# Patient Record
Sex: Male | Born: 1940 | Race: White | Hispanic: No | Marital: Married | Smoking: Former smoker
Health system: Southern US, Community
[De-identification: ages and names within clinical notes are randomized; demographics above are authoritative.]

## PROBLEM LIST (undated history)

## (undated) DIAGNOSIS — M1990 Unspecified osteoarthritis, unspecified site: Secondary | ICD-10-CM

## (undated) DIAGNOSIS — R079 Chest pain, unspecified: Secondary | ICD-10-CM

## (undated) DIAGNOSIS — I5022 Chronic systolic (congestive) heart failure: Secondary | ICD-10-CM

## (undated) DIAGNOSIS — D500 Iron deficiency anemia secondary to blood loss (chronic): Secondary | ICD-10-CM

## (undated) DIAGNOSIS — K5790 Diverticulosis of intestine, part unspecified, without perforation or abscess without bleeding: Secondary | ICD-10-CM

## (undated) DIAGNOSIS — K2100 Gastro-esophageal reflux disease with esophagitis, without bleeding: Secondary | ICD-10-CM

## (undated) DIAGNOSIS — I219 Acute myocardial infarction, unspecified: Secondary | ICD-10-CM

## (undated) DIAGNOSIS — I739 Peripheral vascular disease, unspecified: Secondary | ICD-10-CM

## (undated) DIAGNOSIS — I255 Ischemic cardiomyopathy: Secondary | ICD-10-CM

## (undated) DIAGNOSIS — R51 Headache: Secondary | ICD-10-CM

## (undated) DIAGNOSIS — I10 Essential (primary) hypertension: Secondary | ICD-10-CM

## (undated) DIAGNOSIS — R519 Headache, unspecified: Secondary | ICD-10-CM

## (undated) DIAGNOSIS — R55 Syncope and collapse: Secondary | ICD-10-CM

## (undated) DIAGNOSIS — I2699 Other pulmonary embolism without acute cor pulmonale: Secondary | ICD-10-CM

## (undated) DIAGNOSIS — I779 Disorder of arteries and arterioles, unspecified: Secondary | ICD-10-CM

## (undated) DIAGNOSIS — I2510 Atherosclerotic heart disease of native coronary artery without angina pectoris: Secondary | ICD-10-CM

## (undated) DIAGNOSIS — R0789 Other chest pain: Secondary | ICD-10-CM

## (undated) DIAGNOSIS — R42 Dizziness and giddiness: Secondary | ICD-10-CM

## (undated) DIAGNOSIS — Z9289 Personal history of other medical treatment: Secondary | ICD-10-CM

## (undated) DIAGNOSIS — R05 Cough: Secondary | ICD-10-CM

## (undated) DIAGNOSIS — D126 Benign neoplasm of colon, unspecified: Secondary | ICD-10-CM

## (undated) DIAGNOSIS — R059 Cough, unspecified: Secondary | ICD-10-CM

## (undated) DIAGNOSIS — R0600 Dyspnea, unspecified: Secondary | ICD-10-CM

## (undated) DIAGNOSIS — E785 Hyperlipidemia, unspecified: Secondary | ICD-10-CM

## (undated) HISTORY — PX: CARPAL TUNNEL RELEASE: SHX101

## (undated) HISTORY — DX: Peripheral vascular disease, unspecified: I73.9

## (undated) HISTORY — PX: CATARACT EXTRACTION: SUR2

## (undated) HISTORY — PX: TONSILLECTOMY: SUR1361

## (undated) HISTORY — DX: Chronic systolic (congestive) heart failure: I50.22

## (undated) HISTORY — DX: Other chest pain: R07.89

## (undated) HISTORY — PX: OTHER SURGICAL HISTORY: SHX169

## (undated) HISTORY — DX: Atherosclerotic heart disease of native coronary artery without angina pectoris: I25.10

## (undated) HISTORY — DX: Chest pain, unspecified: R07.9

## (undated) HISTORY — DX: Iron deficiency anemia secondary to blood loss (chronic): D50.0

## (undated) HISTORY — DX: Ischemic cardiomyopathy: I25.5

## (undated) HISTORY — DX: Disorder of arteries and arterioles, unspecified: I77.9

## (undated) HISTORY — PX: COLONOSCOPY: SHX174

## (undated) HISTORY — DX: Hyperlipidemia, unspecified: E78.5

---
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
## 2004-05-02 ENCOUNTER — Ambulatory Visit: Payer: Self-pay | Admitting: Gastroenterology

## 2005-02-02 ENCOUNTER — Ambulatory Visit: Payer: Self-pay | Admitting: Gastroenterology

## 2005-02-21 ENCOUNTER — Ambulatory Visit: Payer: Self-pay

## 2005-03-14 ENCOUNTER — Ambulatory Visit: Payer: Self-pay | Admitting: Orthopaedic Surgery

## 2005-09-07 ENCOUNTER — Emergency Department: Payer: Self-pay | Admitting: Emergency Medicine

## 2007-06-25 ENCOUNTER — Ambulatory Visit: Payer: Self-pay | Admitting: Unknown Physician Specialty

## 2007-07-08 ENCOUNTER — Emergency Department: Payer: Self-pay | Admitting: Emergency Medicine

## 2007-07-19 ENCOUNTER — Emergency Department: Payer: Self-pay

## 2009-01-29 ENCOUNTER — Ambulatory Visit: Payer: Self-pay

## 2009-12-08 ENCOUNTER — Encounter: Admission: RE | Admit: 2009-12-08 | Discharge: 2009-12-08 | Payer: Self-pay | Admitting: Orthopedic Surgery

## 2010-02-03 ENCOUNTER — Encounter
Admission: RE | Admit: 2010-02-03 | Discharge: 2010-02-03 | Payer: Self-pay | Source: Home / Self Care | Attending: Orthopedic Surgery | Admitting: Orthopedic Surgery

## 2010-08-24 ENCOUNTER — Ambulatory Visit: Payer: Self-pay | Admitting: Gastroenterology

## 2010-08-30 ENCOUNTER — Ambulatory Visit: Payer: Self-pay | Admitting: Gastroenterology

## 2011-08-11 ENCOUNTER — Ambulatory Visit: Payer: Self-pay | Admitting: General Practice

## 2012-02-14 ENCOUNTER — Ambulatory Visit: Payer: Self-pay | Admitting: Ophthalmology

## 2012-05-01 ENCOUNTER — Ambulatory Visit: Payer: Self-pay | Admitting: Cardiology

## 2012-05-01 HISTORY — PX: CARDIAC CATHETERIZATION: SHX172

## 2012-07-29 ENCOUNTER — Ambulatory Visit: Payer: Self-pay | Admitting: Cardiology

## 2012-08-22 ENCOUNTER — Ambulatory Visit: Payer: Self-pay | Admitting: Orthopedic Surgery

## 2012-08-22 HISTORY — PX: DE QUERVAIN'S RELEASE: SHX1439

## 2013-04-23 HISTORY — PX: CARDIAC CATHETERIZATION: SHX172

## 2013-05-05 ENCOUNTER — Ambulatory Visit (INDEPENDENT_AMBULATORY_CARE_PROVIDER_SITE_OTHER): Payer: Medicare Other | Admitting: Cardiovascular Disease

## 2013-05-05 ENCOUNTER — Encounter (INDEPENDENT_AMBULATORY_CARE_PROVIDER_SITE_OTHER): Payer: Self-pay

## 2013-05-05 ENCOUNTER — Encounter: Payer: Self-pay | Admitting: Cardiovascular Disease

## 2013-05-05 VITALS — BP 120/62 | HR 68 | Ht 70.0 in | Wt 184.5 lb

## 2013-05-05 DIAGNOSIS — I209 Angina pectoris, unspecified: Secondary | ICD-10-CM | POA: Insufficient documentation

## 2013-05-05 DIAGNOSIS — E785 Hyperlipidemia, unspecified: Secondary | ICD-10-CM

## 2013-05-05 DIAGNOSIS — I2510 Atherosclerotic heart disease of native coronary artery without angina pectoris: Secondary | ICD-10-CM

## 2013-05-05 DIAGNOSIS — R079 Chest pain, unspecified: Secondary | ICD-10-CM | POA: Insufficient documentation

## 2013-05-05 DIAGNOSIS — Z87891 Personal history of nicotine dependence: Secondary | ICD-10-CM

## 2013-05-05 DIAGNOSIS — R0602 Shortness of breath: Secondary | ICD-10-CM | POA: Insufficient documentation

## 2013-05-05 DIAGNOSIS — I25709 Atherosclerosis of coronary artery bypass graft(s), unspecified, with unspecified angina pectoris: Secondary | ICD-10-CM | POA: Insufficient documentation

## 2013-05-05 NOTE — Patient Instructions (Signed)
You are doing well. No medication changes were made.  We will discuss your cardiac cath films and try to call you back tomorrow with a plan If you do not hear from the office by Wednesday, please call the office  Please call us if you have new issues that need to be addressed before your next appt.

## 2013-05-05 NOTE — Assessment & Plan Note (Signed)
Subtotally occluded left circumflex, severe disease of the distal RCA, moderate disease of the LAD. Now with new symptoms of worsening shortness of breath and chest pain. Will review films. Will likely need a catheterization

## 2013-05-05 NOTE — Assessment & Plan Note (Signed)
40 years of smoking. Likely has some component of COPD that might be contributing to his shortness of breath

## 2013-05-05 NOTE — Assessment & Plan Note (Addendum)
Etiology of his shortness of breath is unclear. Concerning for worsening ischemia. Unable to exclude COPD, pulmonary hypertension. He has known severe left circumflex disease, RCA disease and LAD disease By catheterization in 2014. I suggested that we review his old films . These were reviewed by myself in clinic today. Uncertain if there has been progression of his disease. If there has been no progression, intervention to the RCA and possibly even the left circumflex might be possible . We'll discuss the images with Dr.  Fletcher Anon. No medication changes made at this time .

## 2013-05-05 NOTE — Assessment & Plan Note (Signed)
Exertional related shortness of breath and chest pain concerning for angina. We will review his prior cardiac catheterization films. Suspect he will need repeat catheterization given prior severe disease

## 2013-05-05 NOTE — Assessment & Plan Note (Signed)
Cholesterol is at goal on the current lipid regimen. No changes to the medications were made.  

## 2013-05-05 NOTE — Progress Notes (Signed)
Patient ID: Scott [name], male    DOB: [birth year], 73 y.o.   MRN: [id number]  HPI Comments: Scott [name] is a pleasant 73 year old gentleman with history of CAD, cardiac catheterization April 2014 showing severe distal RCA disease, with left to right collaterals, subtotally occluded proximal left circumflex, 50% proximal LAD, 40% mid LAD disease, ejection fraction at that time 35%, echocardiogram showing ejection fraction 40-45% presenting for new patient evaluation for shortness of breath.  Reports that in March of 2014, Scott [name] developed shortness of breath. Scott [name] had catheterization as detailed above. Scott [name] is active but has noticed increasing shortness of breath and chest pain over the past several months. Scott [name] continues to do moderate exercise but is more limited. Chest pain does not last, typically will resolve with resting. Scott [name] did report having some chest pain in January of 2015 his outside cardiologist. No intervention planned, medical management recommended.  Echocardiogram 04/22/2012 showing ejection fraction 40%, no mention of wall motion abnormality, mild MR, TR Stress test 04/25/2012 with no significant ischemia. Perfusion defect consistent with infarct in the inferolateral wall, ejection fraction 33% rest mother skin July 2014 showing ejection fraction 40%, no wall motion abnormality  Scott [name] reports Scott [name] is active over the winter, does woodworking but no aerobic. 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  Scott [name] did smoke from age [age], total of 40 years. Stopped smoking many years ago. In 2011 Denies having any diabetes. Has been compliant with his cholesterol medication  EKG shows normal sinus rhythm with rate 68 beats per minute, nonspecific ST and T wave abnormality in the anterolateral and inferior leads   Outpatient Encounter Prescriptions as of 05/05/2013  Medication Sig  . aspirin EC 81 MG tablet Take 162 mg by mouth daily.   Marland Kitchen losartan (COZAAR) 50 MG tablet  Take 50 mg by mouth daily.  . metoprolol succinate (TOPROL-XL) 50 MG 24 hr tablet Take 50 mg by mouth daily. Take with or immediately following a meal.  . simvastatin (ZOCOR) 40 MG tablet Take 40 mg by mouth daily.     Review of Systems  Constitutional: Negative.   HENT: Negative.   Eyes: Negative.   Respiratory: Negative.   Cardiovascular: Negative.   Gastrointestinal: Negative.   Endocrine: Negative.   Musculoskeletal: Negative.   Skin: Negative.   Allergic/Immunologic: Negative.   Neurological: Negative.   Hematological: Negative.   Psychiatric/Behavioral: Negative.   All other systems reviewed and are negative.   BP 120/62  Pulse 68  Ht 5\' 10"  (1.778 m)  Wt 184 lb 8 oz (83.689 kg)  BMI 26.47 kg/m2  Physical Exam  Nursing note and vitals reviewed. Constitutional: Scott [name] is oriented to person, place, and time. Scott [name] appears well-developed and well-nourished.  HENT:  Head: Normocephalic.  Nose: Nose normal.  Mouth/Throat: Oropharynx is clear and moist.  Eyes: Conjunctivae are normal. Pupils are equal, round, and reactive to light.  Neck: Normal range of motion. Neck supple. No JVD present.  Cardiovascular: Normal rate, regular rhythm, S1 normal, S2 normal, normal heart sounds and intact distal pulses.  Exam reveals no gallop and no friction rub.   No murmur heard. Pulmonary/Chest: Effort normal and breath sounds normal. No respiratory distress. Scott [name] has no wheezes. Scott [name] has no rales. Scott [name] exhibits no tenderness.  Abdominal: Soft. Bowel sounds are normal. Scott [name] exhibits no distension. There is no tenderness.  Musculoskeletal: Normal range of motion. Scott [name] exhibits no edema and no tenderness.  Lymphadenopathy:  Scott [name] has no cervical adenopathy.  Neurological: Scott [name] is alert and oriented to person, place, and time. Coordination normal.  Skin: Skin is warm and dry. No rash noted. No erythema.  Psychiatric: Scott [name] has a normal mood and affect. His behavior is normal. Judgment and thought content  normal.      Assessment and Plan

## 2013-05-06 ENCOUNTER — Telehealth: Payer: Self-pay

## 2013-05-06 DIAGNOSIS — Z01810 Encounter for preprocedural cardiovascular examination: Secondary | ICD-10-CM

## 2013-05-06 DIAGNOSIS — Z01812 Encounter for preprocedural laboratory examination: Secondary | ICD-10-CM

## 2013-05-06 DIAGNOSIS — R079 Chest pain, unspecified: Secondary | ICD-10-CM

## 2013-05-06 NOTE — Telephone Encounter (Signed)
Spoke w/ pt.  Advised him that Dr. Rockey Situ had spoke w/ Dr. Fletcher Anon and they agree that pt should proceed w/ cath.  Advised him that I will call him back when they are able to coordinate their schedules and set this up. Pt is agreeable to this and will await further instructions.

## 2013-05-07 NOTE — Telephone Encounter (Signed)
Spoke w/ pt.  He is agreeable to proceeding w/ cardiac cath on 05/16/13. 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 Advised him that I will mail him info on instructions for cath.

## 2013-05-09 ENCOUNTER — Other Ambulatory Visit: Payer: Self-pay

## 2013-05-09 ENCOUNTER — Ambulatory Visit: Payer: Self-pay | Admitting: Cardiovascular Disease

## 2013-05-09 DIAGNOSIS — R079 Chest pain, unspecified: Secondary | ICD-10-CM

## 2013-05-09 DIAGNOSIS — Z01810 Encounter for preprocedural cardiovascular examination: Secondary | ICD-10-CM

## 2013-05-09 DIAGNOSIS — Z01812 Encounter for preprocedural laboratory examination: Secondary | ICD-10-CM

## 2013-05-09 LAB — BASIC METABOLIC PANEL
ANION GAP: 4 — AB (ref 7–16)
BUN: 23 mg/dL — AB (ref 7–18)
CALCIUM: 8.7 mg/dL (ref 8.5–10.1)
Chloride: 106 mmol/L (ref 98–107)
Co2: 28 mmol/L (ref 21–32)
Creatinine: 1.18 mg/dL (ref 0.60–1.30)
EGFR (African American): >60
Glucose: 87 mg/dL (ref 65–99)
Osmolality: 279 (ref 275–301)
Potassium: 4 mmol/L (ref 3.5–5.1)
Sodium: 138 mmol/L (ref 136–145)

## 2013-05-09 LAB — CBC WITH DIFFERENTIAL/PLATELET
BASOS ABS: 0.1 10*3/uL (ref 0.0–0.1)
BASOS PCT: 1.1 %
Eosinophil #: 0 10*3/uL (ref 0.0–0.7)
Eosinophil %: 0.9 %
HCT: 35.7 % — ABNORMAL LOW (ref 40.0–52.0)
HGB: 10.9 g/dL — ABNORMAL LOW (ref 13.0–18.0)
Lymphocyte #: 1.6 10*3/uL (ref 1.0–3.6)
Lymphocyte %: 33.5 %
MCH: 22.2 pg — ABNORMAL LOW (ref 26.0–34.0)
MCHC: 30.6 g/dL — ABNORMAL LOW (ref 32.0–36.0)
MCV: 73 fL — ABNORMAL LOW (ref 80–100)
MONOS PCT: 7.7 %
Monocyte #: 0.4 x10 3/mm (ref 0.2–1.0)
Neutrophil #: 2.7 10*3/uL (ref 1.4–6.5)
Neutrophil %: 56.8 %
PLATELETS: 302 10*3/uL (ref 150–440)
RBC: 4.92 10*6/uL (ref 4.40–5.90)
RDW: 18.4 % — ABNORMAL HIGH (ref 11.5–14.5)
WBC: 4.7 10*3/uL (ref 3.8–10.6)

## 2013-05-09 LAB — PROTIME-INR
INR: 1
PROTHROMBIN TIME: 13.1 s (ref 11.5–14.7)

## 2013-05-16 ENCOUNTER — Encounter: Payer: Self-pay | Admitting: Cardiovascular Disease

## 2013-05-16 ENCOUNTER — Ambulatory Visit: Payer: Self-pay | Admitting: Cardiovascular Disease

## 2013-05-16 DIAGNOSIS — I2510 Atherosclerotic heart disease of native coronary artery without angina pectoris: Secondary | ICD-10-CM

## 2013-05-16 LAB — CK TOTAL AND CKMB (NOT AT ARMC)
CK, TOTAL: 132 U/L
CK-MB: 1.4 ng/mL (ref 0.5–3.6)

## 2013-05-17 ENCOUNTER — Other Ambulatory Visit: Payer: Self-pay | Admitting: Cardiovascular Disease

## 2013-05-17 DIAGNOSIS — I209 Angina pectoris, unspecified: Secondary | ICD-10-CM

## 2013-05-17 DIAGNOSIS — I2510 Atherosclerotic heart disease of native coronary artery without angina pectoris: Secondary | ICD-10-CM

## 2013-05-17 LAB — BASIC METABOLIC PANEL
ANION GAP: 5 — AB (ref 7–16)
BUN: 16 mg/dL (ref 7–18)
CALCIUM: 8.6 mg/dL (ref 8.5–10.1)
CO2: 27 mmol/L (ref 21–32)
CREATININE: 1.15 mg/dL (ref 0.60–1.30)
Chloride: 104 mmol/L (ref 98–107)
EGFR (African American): >60
EGFR (Non-African Amer.): >60
GLUCOSE: 101 mg/dL — AB (ref 65–99)
Osmolality: 273 (ref 275–301)
Potassium: 4.1 mmol/L (ref 3.5–5.1)
Sodium: 136 mmol/L (ref 136–145)

## 2013-05-17 MED ORDER — PRASUGREL HCL 10 MG PO TABS: 10.0000 mg | ORAL_TABLET | Freq: Every day | ORAL | Status: DC

## 2013-05-17 MED ORDER — ASPIRIN EC 81 MG PO TBEC: 81.0000 mg | DELAYED_RELEASE_TABLET | Freq: Every day | ORAL | Status: DC

## 2013-05-19 ENCOUNTER — Encounter: Payer: Self-pay | Admitting: Cardiovascular Disease

## 2013-05-26 ENCOUNTER — Encounter: Payer: Self-pay | Admitting: *Deleted

## 2013-05-30 ENCOUNTER — Encounter: Payer: Self-pay | Admitting: Cardiovascular Disease

## 2013-05-30 ENCOUNTER — Ambulatory Visit (INDEPENDENT_AMBULATORY_CARE_PROVIDER_SITE_OTHER): Payer: Medicare Other | Admitting: Cardiovascular Disease

## 2013-05-30 VITALS — BP 118/66 | HR 63 | Ht 70.0 in | Wt 184.0 lb

## 2013-05-30 DIAGNOSIS — R0602 Shortness of breath: Secondary | ICD-10-CM

## 2013-05-30 DIAGNOSIS — Z9861 Coronary angioplasty status: Secondary | ICD-10-CM

## 2013-05-30 DIAGNOSIS — I2510 Atherosclerotic heart disease of native coronary artery without angina pectoris: Secondary | ICD-10-CM

## 2013-05-30 DIAGNOSIS — Z955 Presence of coronary angioplasty implant and graft: Secondary | ICD-10-CM | POA: Insufficient documentation

## 2013-05-30 DIAGNOSIS — E785 Hyperlipidemia, unspecified: Secondary | ICD-10-CM

## 2013-05-30 DIAGNOSIS — I209 Angina pectoris, unspecified: Secondary | ICD-10-CM

## 2013-05-30 NOTE — Assessment & Plan Note (Signed)
Encouraged him to stay on his simvastatin. Goal LDL less than 70

## 2013-05-30 NOTE — Assessment & Plan Note (Signed)
He denies any further chest pain or symptoms concerning for angina since the stent placement. No further medication changes at this time. If he has recurrent symptoms, may need to discuss high risk intervention of his left circumflex

## 2013-05-30 NOTE — Progress Notes (Signed)
Patient ID: Scott [name], male    DOB: [birth year], 73 y.o.   MRN: [id number]  HPI Comments: Scott [name] is a pleasant 73 year old gentleman with history of CAD, cardiac catheterization April 2014 showing severe distal RCA disease, with left to right collaterals, subtotally occluded proximal left circumflex, 50% proximal LAD, 40% mid LAD disease, ejection fraction at that time 35%, echocardiogram showing ejection fraction 40-45% , shortness of breath on his last clinic visit, now presenting after recent stenting to his RCA.  March of 2014, he developed shortness of breath. He had catheterization as detailed above.  increasing shortness of breath and chest pain over the past several months. He did report having some chest pain in January of 2015 his outside cardiologist. No intervention planned, medical management recommended.  We scheduled a repeat cardiac catheterization 05/16/2013. His showed severe mid to distal RCA disease, severe subtotally occluded proximal left circumflex disease, left to right collaterals from the mid to distal left circumflex, mild to moderate proximal LAD disease, ejection fraction 50%. After long discussion with Dr. Cecelia Byars it was felt that he would be best served by intervention on his RCA rather than the left circumflex which was a smaller vessel. There was no significant targets for CABG and LAD grafting was not needed.   In followup today, he reports that he feels better, still with some fatigue. He is active around the house, no regular aerobic program. He is scheduled to see cardiac rehabilitation in the next several days  Echocardiogram 04/22/2012 showing ejection fraction 40%, no mention of wall motion abnormality, mild MR, TR Stress test 04/25/2012 with no significant ischemia. Perfusion defect consistent with infarct in the inferolateral wall, ejection fraction 33% rest mother skin July 2014 showing ejection fraction 40%, no wall motion abnormality  He reports he is  active over the winter, does woodworking but no aerobic. 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  He did smoke from age [age], total of 40 years. Stopped smoking many years ago. In 2011 Denies having any diabetes. Has been compliant with his cholesterol medication  EKG shows normal sinus rhythm with rate 63 beats per minute, QRS 128 ms, nonspecific intraventricular block   Outpatient Encounter Prescriptions as of 05/30/2013  Medication Sig  . aspirin EC 81 MG tablet Take 1 tablet (81 mg total) by mouth daily.  Marland Kitchen losartan (COZAAR) 50 MG tablet Take 50 mg by mouth daily.  . metoprolol succinate (TOPROL-XL) 50 MG 24 hr tablet Take 50 mg by mouth daily. Take with or immediately following a meal.  . prasugrel (EFFIENT) 10 MG TABS tablet Take 1 tablet (10 mg total) by mouth daily.  . simvastatin (ZOCOR) 40 MG tablet Take 40 mg by mouth daily.    Review of Systems  Constitutional: Positive for fatigue.  HENT: Negative.   Eyes: Negative.   Respiratory: Negative.   Cardiovascular: Negative.   Gastrointestinal: Negative.   Endocrine: Negative.   Musculoskeletal: Negative.   Skin: Negative.   Allergic/Immunologic: Negative.   Neurological: Negative.   Hematological: Negative.   Psychiatric/Behavioral: Negative.   All other systems reviewed and are negative.   BP 118/66  Pulse 63  Ht 5\' 10"  (1.778 m)  Wt 184 lb (83.462 kg)  BMI 26.40 kg/m2  Physical Exam  Nursing note and vitals reviewed. Constitutional: He is oriented to person, place, and time. He appears well-developed and well-nourished.  HENT:  Head: Normocephalic.  Nose: Nose normal.  Mouth/Throat: Oropharynx  is clear and moist.  Eyes: Conjunctivae are normal. Pupils are equal, round, and reactive to light.  Neck: Normal range of motion. Neck supple. No JVD present.  Cardiovascular: Normal rate, regular rhythm, S1 normal, S2 normal, normal heart sounds and intact distal pulses.  Exam  reveals no gallop and no friction rub.   No murmur heard. Pulmonary/Chest: Effort normal and breath sounds normal. No respiratory distress. He has no wheezes. He has no rales. He exhibits no tenderness.  Abdominal: Soft. Bowel sounds are normal. He exhibits no distension. There is no tenderness.  Musculoskeletal: Normal range of motion. He exhibits no edema and no tenderness.  Lymphadenopathy:    He has no cervical adenopathy.  Neurological: He is alert and oriented to person, place, and time. Coordination normal.  Skin: Skin is warm and dry. No rash noted. No erythema.  Psychiatric: He has a normal mood and affect. His behavior is normal. Judgment and thought content normal.      Assessment and Plan

## 2013-05-30 NOTE — Assessment & Plan Note (Signed)
Recent intervention with stenting to his mid to distal RCA. Encouraged him to stay on his aspirin and effient

## 2013-05-30 NOTE — Patient Instructions (Signed)
You are doing well. No medication changes were made.  Please monitor your blood pressure at home CAll the office if it runs low (less than 110 on the top number)  Please call us if you have new issues that need to be addressed before your next appt.  Your physician wants you to follow-up in: 6 months.  You will receive a reminder letter in the mail two months in advance. If you don't receive a letter, please call our office to schedule the follow-up appointment.

## 2013-05-30 NOTE — Assessment & Plan Note (Signed)
Scott [name] is patent, now with stenting to his RCA. Still with residual severe left circumflex disease. This is a small vessel, not easily amenable to intervention.

## 2013-06-02 ENCOUNTER — Telehealth: Payer: Self-pay | Admitting: *Deleted

## 2013-06-02 NOTE — Telephone Encounter (Signed)
Spoke w/ pt.  He reports that he was working out in his yard, felt "swimmy-headed, like when you've had a couple of shots". Advised pt to try OTC meclizine 25mg  and see if this helped w/ symptoms. Offered to sched carotid u/s.  He would like to proceed w/ this as soon as we have an available time.  Pt sched to carotid tomorrow at 3:00.

## 2013-06-02 NOTE — Telephone Encounter (Signed)
Patient called and patient gets very dizzy everytime he looks up.

## 2013-06-03 ENCOUNTER — Encounter (INDEPENDENT_AMBULATORY_CARE_PROVIDER_SITE_OTHER): Payer: Medicare Other

## 2013-06-03 ENCOUNTER — Other Ambulatory Visit: Payer: Self-pay

## 2013-06-03 DIAGNOSIS — I209 Angina pectoris, unspecified: Secondary | ICD-10-CM

## 2013-06-03 DIAGNOSIS — I2510 Atherosclerotic heart disease of native coronary artery without angina pectoris: Secondary | ICD-10-CM

## 2013-06-03 DIAGNOSIS — R42 Dizziness and giddiness: Secondary | ICD-10-CM

## 2013-06-03 DIAGNOSIS — I6529 Occlusion and stenosis of unspecified carotid artery: Secondary | ICD-10-CM

## 2013-06-05 ENCOUNTER — Encounter: Payer: Self-pay | Admitting: Cardiovascular Disease

## 2013-06-23 ENCOUNTER — Encounter: Payer: Self-pay | Admitting: Cardiovascular Disease

## 2013-07-23 ENCOUNTER — Encounter: Payer: Self-pay | Admitting: Cardiovascular Disease

## 2013-08-23 ENCOUNTER — Encounter: Payer: Self-pay | Admitting: Cardiovascular Disease

## 2013-09-03 ENCOUNTER — Inpatient Hospital Stay: Payer: Self-pay | Admitting: Cardiovascular Disease

## 2013-09-03 DIAGNOSIS — I10 Essential (primary) hypertension: Secondary | ICD-10-CM

## 2013-09-03 DIAGNOSIS — I200 Unstable angina: Secondary | ICD-10-CM

## 2013-09-03 DIAGNOSIS — E785 Hyperlipidemia, unspecified: Secondary | ICD-10-CM

## 2013-09-03 LAB — APTT: ACTIVATED PTT: 30 s (ref 23.6–35.9)

## 2013-09-03 LAB — CBC
HCT: 34 % — ABNORMAL LOW (ref 40.0–52.0)
HGB: 10.4 g/dL — ABNORMAL LOW (ref 13.0–18.0)
MCH: 21.5 pg — ABNORMAL LOW (ref 26.0–34.0)
MCHC: 30.5 g/dL — ABNORMAL LOW (ref 32.0–36.0)
MCV: 71 fL — ABNORMAL LOW (ref 80–100)
Platelet: 273 10*3/uL (ref 150–440)
RBC: 4.82 10*6/uL (ref 4.40–5.90)
RDW: 20.1 % — ABNORMAL HIGH (ref 11.5–14.5)
WBC: 4.1 10*3/uL (ref 3.8–10.6)

## 2013-09-03 LAB — CK TOTAL AND CKMB (NOT AT ARMC)
CK, TOTAL: 144 U/L
CK, Total: 116 U/L
CK-MB: 0.8 ng/mL (ref 0.5–3.6)
CK-MB: 0.9 ng/mL (ref 0.5–3.6)

## 2013-09-03 LAB — TROPONIN I: Troponin-I: <0.02 ng/mL

## 2013-09-03 LAB — HEPARIN LEVEL (UNFRACTIONATED): Anti-Xa(Unfractionated): 0.47 IU/mL (ref 0.30–0.70)

## 2013-09-03 LAB — PROTIME-INR
INR: 1.1
Prothrombin Time: 13.7 secs (ref 11.5–14.7)

## 2013-09-04 DIAGNOSIS — I059 Rheumatic mitral valve disease, unspecified: Secondary | ICD-10-CM

## 2013-09-04 LAB — LIPID PANEL
Cholesterol: 117 mg/dL (ref 0–200)
HDL Cholesterol: 44 mg/dL (ref 40–60)
Ldl Cholesterol, Calc: 57 mg/dL (ref 0–100)
Triglycerides: 78 mg/dL (ref 0–200)
VLDL CHOLESTEROL, CALC: 16 mg/dL (ref 5–40)

## 2013-09-04 LAB — CBC WITH DIFFERENTIAL/PLATELET
BASOS PCT: 0.7 %
Basophil #: 0 10*3/uL (ref 0.0–0.1)
Eosinophil #: 0.1 10*3/uL (ref 0.0–0.7)
Eosinophil %: 2.2 %
HCT: 34.1 % — ABNORMAL LOW (ref 40.0–52.0)
HGB: 10.5 g/dL — ABNORMAL LOW (ref 13.0–18.0)
Lymphocyte #: 2.3 10*3/uL (ref 1.0–3.6)
Lymphocyte %: 36.1 %
MCH: 21.7 pg — ABNORMAL LOW (ref 26.0–34.0)
MCHC: 30.7 g/dL — ABNORMAL LOW (ref 32.0–36.0)
MCV: 71 fL — ABNORMAL LOW (ref 80–100)
MONO ABS: 0.6 x10 3/mm (ref 0.2–1.0)
Monocyte %: 9.4 %
Neutrophil #: 3.3 10*3/uL (ref 1.4–6.5)
Neutrophil %: 51.6 %
PLATELETS: 259 10*3/uL (ref 150–440)
RBC: 4.82 10*6/uL (ref 4.40–5.90)
RDW: 20.4 % — ABNORMAL HIGH (ref 11.5–14.5)
WBC: 6.4 10*3/uL (ref 3.8–10.6)

## 2013-09-04 LAB — BASIC METABOLIC PANEL
Anion Gap: 8 (ref 7–16)
BUN: 17 mg/dL (ref 7–18)
CO2: 28 mmol/L (ref 21–32)
CREATININE: 1.36 mg/dL — AB (ref 0.60–1.30)
Calcium, Total: 8.8 mg/dL (ref 8.5–10.1)
Chloride: 107 mmol/L (ref 98–107)
EGFR (African American): >60
GFR CALC NON AF AMER: 52 — AB
Glucose: 96 mg/dL (ref 65–99)
OSMOLALITY: 286 (ref 275–301)
Potassium: 4.1 mmol/L (ref 3.5–5.1)
SODIUM: 143 mmol/L (ref 136–145)

## 2013-09-04 LAB — HEPARIN LEVEL (UNFRACTIONATED): Anti-Xa(Unfractionated): 0.54 IU/mL (ref 0.30–0.70)

## 2013-09-04 LAB — TROPONIN I

## 2013-09-04 LAB — TSH: Thyroid Stimulating Horm: 3.29 u[IU]/mL

## 2013-09-05 ENCOUNTER — Encounter: Payer: Self-pay | Admitting: Cardiovascular Disease

## 2013-09-05 ENCOUNTER — Telehealth: Payer: Self-pay

## 2013-09-05 MED ORDER — NITROGLYCERIN 0.4 MG SL SUBL: 0.4000 mg | SUBLINGUAL_TABLET | SUBLINGUAL | Status: DC | PRN

## 2013-09-05 NOTE — Telephone Encounter (Signed)
Patient contacted regarding discharge from [facility] on 09/04/13.  Patient understands to follow up with Ignacia Bayley, NP on 09/11/13 at 2:45 at [facility]. Patient understands discharge instructions? yes Patient understands medications and regiment? yes Patient understands to bring all medications to this visit? yes

## 2013-09-11 ENCOUNTER — Encounter: Payer: Self-pay | Admitting: Nurse Practitioner

## 2013-09-11 ENCOUNTER — Ambulatory Visit (INDEPENDENT_AMBULATORY_CARE_PROVIDER_SITE_OTHER): Payer: Medicare Other | Admitting: Nurse Practitioner

## 2013-09-11 VITALS — BP 112/78 | HR 62 | Ht 70.0 in | Wt 179.0 lb

## 2013-09-11 DIAGNOSIS — I739 Peripheral vascular disease, unspecified: Secondary | ICD-10-CM

## 2013-09-11 DIAGNOSIS — I255 Ischemic cardiomyopathy: Secondary | ICD-10-CM | POA: Insufficient documentation

## 2013-09-11 DIAGNOSIS — I2510 Atherosclerotic heart disease of native coronary artery without angina pectoris: Secondary | ICD-10-CM

## 2013-09-11 DIAGNOSIS — I10 Essential (primary) hypertension: Secondary | ICD-10-CM | POA: Insufficient documentation

## 2013-09-11 DIAGNOSIS — R079 Chest pain, unspecified: Secondary | ICD-10-CM

## 2013-09-11 DIAGNOSIS — R0789 Other chest pain: Secondary | ICD-10-CM

## 2013-09-11 DIAGNOSIS — I779 Disorder of arteries and arterioles, unspecified: Secondary | ICD-10-CM

## 2013-09-11 NOTE — Patient Instructions (Signed)
Your physician recommends that you schedule a follow-up appointment in:  Dr. Rockey Situ in 3 months   Your physician recommends that you continue on your current medications as directed. Please refer to the Current Medication list given to you today.

## 2013-09-11 NOTE — Progress Notes (Signed)
Patient Name: Scott [name] Date of Encounter: 09/11/2013  Primary Care Provider:  Lottie Mussel, MD Primary Cardiologist:  Johnny Bridge, MD   Patient Profile  73 y/o male with a h/o CAD and ICM, who presents today after recent hospitalization @ [facility] for c/p.  Problem List   Past Medical History  Diagnosis Date  . CAD (coronary artery disease)     a. 04/2012 Cath: 3VD->Med Rx;  b. 04/2013 Cath/PCI: LAD 50p, D1 40, LCX 80p, OM2 99diff, RCA 70p, 72m, 95d. Mid & distal RCA treated with 2 DES.  . Cardiomyopathy, ischemic     a. 04/2012 Echo: EF 40-45%;  b. 08/2013 Echo: EF 45-50%, mild glob HK, mod lat/post HK, diast dysfxn, mildly dil LA, mild MR, nl RVSP.  Marland Kitchen Hyperlipidemia   . HTN (hypertension)   . Carotid arterial disease     a. 05/2013 Carotid U/S; bilat 40-50% ICA stenosis.   Past Surgical History  Procedure Laterality Date  . Right shoulder    . Carpal tunnel release      right hand  . Tonsillectomy    . Cardiac catheterization  05/01/2012  . Cardiac catheterization  04/2013    [facility];x3 stent    Allergies  No Known Allergies  HPI  73 y/o male with the above complex problem list.  He is s/p DES to the RCA x 2 in 04/2013.  He was admitted to [facility] last week with chest pain.  He r/o for MI.  Echo showed EF of 45-50%.  Pain was felt to be MSK in nature and he had improvement in Ss with naproxen.  No further ischemic eval was planned and he was d/c'd home. Since d/c, he has remained active @ home and continues to partake in cardiac rehab.  Yesterday morning, he helped to pour concrete w/o limitations or Ss and later in the day completed cardiac rehab.  He says that he has been feeling very well w/o any chest pain or dyspnea.  He has not had to take any ntg or naproxen.  He denies palpitations, pnd, orthopnea, n, v, dizziness, syncope, edema, weight gain, or early satiety.   Home Medications  Prior to Admission medications   Medication Sig Start Date End Date Taking? Authorizing  Provider  aspirin EC 81 MG tablet Take 1 tablet (81 mg total) by mouth daily. 05/17/13  Yes Wellington Hampshire, MD  losartan (COZAAR) 50 MG tablet Take 50 mg by mouth daily.   Yes Historical Provider, MD  metoprolol succinate (TOPROL-XL) 50 MG 24 hr tablet Take 50 mg by mouth daily. Take with or immediately following a meal.   Yes Historical Provider, MD  nitroGLYCERIN (NITROSTAT) 0.4 MG SL tablet Place 1 tablet (0.4 mg total) under the tongue every 5 (five) minutes as needed for chest pain. 09/05/13  Yes Minna Merritts, MD  prasugrel (EFFIENT) 10 MG TABS tablet Take 1 tablet (10 mg total) by mouth daily. 05/17/13  Yes Wellington Hampshire, MD  simvastatin (ZOCOR) 40 MG tablet Take 40 mg by mouth daily.   Yes Historical Provider, MD    Review of Systems  Doing well as above.  He denies chest pain, palpitations, dyspnea, pnd, orthopnea, n, v, dizziness, syncope, edema, weight gain, or early satiety.  All other systems reviewed and are otherwise negative except as noted above.  Physical Exam  Blood pressure 112/78, pulse 62, height 5\' 10"  (1.778 m), weight 179 lb (81.194 kg).  General: Pleasant, NAD Psych: Normal affect.  Neuro: Alert and oriented X 3. Moves all extremities spontaneously. HEENT: Normal  Neck: Supple without bruits or JVD. Lungs:  Resp regular and unlabored, CTA. Heart: RRR no s3, s4, or murmurs. Abdomen: Soft, non-tender, non-distended, BS + x 4.  Extremities: No clubbing, cyanosis or edema. DP/PT/Radials 2+ and equal bilaterally.  Accessory Clinical Findings  ECG - RSR, 62, IVCD, no acute st/t changes.  Assessment & Plan  1.  Midsternal chest pain/CAD:  C/P felt to be MSK in nature.  No obj evidence of ischemia while hospitalized.  No further chest pain.  Exercising @ rehab w/o difficulty. He has known residual LCX dzs (small vessel - not amenable to PCI). Cont ASA, effient, bb, statin.  2.  ICM/Chronic systolic CHF:  EF 95-28% by recent echo.  Euvolemic today.  Down 6 lbs  since May.  No Ss @ home.  Cont BB/ARB.  3.  HTN:  Stable.  4.  HL:  Cont statin.  5.  Carotid Dzs:  40-59% bilat ICA stenosis.  F/U 1 yr. Cont asa/statin.  6. Dispo:  He'd like to f/u with Dr. Rockey Situ in 3 mos.   Murray Hodgkins, NP 09/11/2013, 3:38 PM

## 2013-09-12 ENCOUNTER — Ambulatory Visit: Payer: Self-pay | Admitting: Unknown Physician Specialty

## 2013-09-23 ENCOUNTER — Encounter: Payer: Self-pay | Admitting: Cardiovascular Disease

## 2013-10-03 DIAGNOSIS — R072 Precordial pain: Secondary | ICD-10-CM

## 2013-10-03 DIAGNOSIS — I779 Disorder of arteries and arterioles, unspecified: Secondary | ICD-10-CM

## 2013-10-03 DIAGNOSIS — I2589 Other forms of chronic ischemic heart disease: Secondary | ICD-10-CM

## 2013-10-03 DIAGNOSIS — I2510 Atherosclerotic heart disease of native coronary artery without angina pectoris: Secondary | ICD-10-CM

## 2013-10-03 DIAGNOSIS — I10 Essential (primary) hypertension: Secondary | ICD-10-CM

## 2013-10-23 ENCOUNTER — Encounter: Payer: Self-pay | Admitting: Cardiovascular Disease

## 2013-12-12 ENCOUNTER — Encounter: Payer: Self-pay | Admitting: Cardiovascular Disease

## 2013-12-12 ENCOUNTER — Ambulatory Visit (INDEPENDENT_AMBULATORY_CARE_PROVIDER_SITE_OTHER): Payer: Medicare Other | Admitting: Cardiovascular Disease

## 2013-12-12 VITALS — BP 132/60 | HR 61 | Ht 70.0 in | Wt 179.5 lb

## 2013-12-12 DIAGNOSIS — R079 Chest pain, unspecified: Secondary | ICD-10-CM

## 2013-12-12 DIAGNOSIS — M19019 Primary osteoarthritis, unspecified shoulder: Secondary | ICD-10-CM | POA: Insufficient documentation

## 2013-12-12 DIAGNOSIS — I779 Disorder of arteries and arterioles, unspecified: Secondary | ICD-10-CM

## 2013-12-12 DIAGNOSIS — I2510 Atherosclerotic heart disease of native coronary artery without angina pectoris: Secondary | ICD-10-CM

## 2013-12-12 DIAGNOSIS — Z720 Tobacco use: Secondary | ICD-10-CM

## 2013-12-12 DIAGNOSIS — I739 Peripheral vascular disease, unspecified: Secondary | ICD-10-CM

## 2013-12-12 DIAGNOSIS — Z955 Presence of coronary angioplasty implant and graft: Secondary | ICD-10-CM

## 2013-12-12 DIAGNOSIS — I10 Essential (primary) hypertension: Secondary | ICD-10-CM

## 2013-12-12 DIAGNOSIS — Z87891 Personal history of nicotine dependence: Secondary | ICD-10-CM

## 2013-12-12 DIAGNOSIS — M19012 Primary osteoarthritis, left shoulder: Secondary | ICD-10-CM

## 2013-12-12 DIAGNOSIS — E785 Hyperlipidemia, unspecified: Secondary | ICD-10-CM

## 2013-12-12 NOTE — Assessment & Plan Note (Signed)
We have ordered a cholesterol panel for him with LFTs. No recent lipids in the past year Goal LDL less than 70

## 2013-12-12 NOTE — Assessment & Plan Note (Signed)
Currently with no symptoms of angina. No further workup at this time. Continue current medication regimen. 

## 2013-12-12 NOTE — Progress Notes (Signed)
Patient ID: Scott [name], male    DOB: 11-15-1940, 73 y.o.   MRN: [id number]  HPI Comments: Scott [name] is a pleasant 73 year old gentleman with history of CAD, cardiac catheterization April 2014 showing severe distal RCA disease, with left to right collaterals, subtotally occluded proximal left circumflex, 50% proximal LAD, 40% mid LAD disease, ejection fraction at that time 35%, echocardiogram showing ejection fraction 40-45% , shortness of breath on his last clinic visit, now presenting after recent stenting to his RCA. He did smoke from age [age], total of 40 years. Stopped smoking many years ago. In 2011 Active at baseline, grows hay He presents for routine follow-up of his coronary artery disease  In follow-up today, he reports that he feels well. He has atypical type chest pain at nighttime at rest. Sometimes worse when he rolls around the bed. He is very active in the daytime, active [employer], does lots of jobs around the house. Denies having any anginal type pain in the daytime when he is active, only sharp atypical type chest pain at nighttime.  He reports having significant pain in his left shoulder though has full range of motion. Initially was thinking of having surgery in early 2016. Surgeon would be Dr. Marry [name].  Denies any significant bruising. Tolerating his other medications. Blood pressure well controlled at home  EKG on today's visit shows normal sinus rhythm with rate 61 bpm, no significant ST or T-wave changes, interventricular conduction delay  Other past medical history March of 2014, he developed shortness of breath. He had catheterization as detailed above.  increasing shortness of breath and chest pain over the past several months. He did report having some chest pain in January of 2015 his outside cardiologist. No intervention planned, medical management recommended.  We scheduled a repeat cardiac catheterization 05/16/2013. His showed severe mid to distal RCA  disease, severe subtotally occluded proximal left circumflex disease, left to right collaterals from the mid to distal left circumflex, mild to moderate proximal LAD disease, ejection fraction 50%. After long discussion with Dr. Cecelia Byars it was felt that he would be best served by intervention on his RCA rather than the left circumflex which was a smaller vessel. There was no significant targets for CABG and LAD grafting was not needed.   Echocardiogram 04/22/2012 showing ejection fraction 40%, no mention of wall motion abnormality, mild MR, TR Stress test 04/25/2012 with no significant ischemia. Perfusion defect consistent with infarct in the inferolateral wall, ejection fraction 33% rest mother skin July 2014 showing ejection fraction 40%, no wall motion abnormality  He reports he is active over the winter, does woodworking but no aerobic. 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     Outpatient Encounter Prescriptions as of 12/12/2013  Medication Sig  . aspirin EC 81 MG tablet Take 1 tablet (81 mg total) by mouth daily.  Marland Kitchen losartan (COZAAR) 50 MG tablet Take 50 mg by mouth daily.  . metoprolol succinate (TOPROL-XL) 50 MG 24 hr tablet Take 50 mg by mouth daily. Take with or immediately following a meal.  . nitroGLYCERIN (NITROSTAT) 0.4 MG SL tablet Place 1 tablet (0.4 mg total) under the tongue every 5 (five) minutes as needed for chest pain.  . prasugrel (EFFIENT) 10 MG TABS tablet Take 1 tablet (10 mg total) by mouth daily.  . simvastatin (ZOCOR) 40 MG tablet Take 40 mg by mouth daily.    Social history  reports that he has quit  smoking. His smoking use included Cigarettes. He started smoking about 4 years ago. He has a 35 pack-year smoking history. He does not have any smokeless tobacco history on file. He reports that he does not drink alcohol or use illicit drugs.   Review of Systems  Constitutional: Negative.   Eyes: Negative.   Respiratory:  Negative.   Cardiovascular: Positive for chest pain.  Gastrointestinal: Negative.   Musculoskeletal: Negative.   Neurological: Negative.   Hematological: Negative.   All other systems reviewed and are negative.   BP 132/60 mmHg  Pulse 61  Ht 5\' 10"  (1.778 m)  Wt 179 lb 8 oz (81.421 kg)  BMI 25.76 kg/m2  Physical Exam  Constitutional: He is oriented to person, place, and time. He appears well-developed and well-nourished.  HENT:  Head: Normocephalic.  Nose: Nose normal.  Mouth/Throat: Oropharynx is clear and moist.  Eyes: Conjunctivae are normal. Pupils are equal, round, and reactive to light.  Neck: Normal range of motion. Neck supple. No JVD present.  Cardiovascular: Normal rate, regular rhythm, S1 normal, S2 normal and intact distal pulses.  Exam reveals no gallop and no friction rub.   Murmur heard.  Systolic murmur is present with a grade of 2/6  Pulmonary/Chest: Effort normal and breath sounds normal. No respiratory distress. He has no wheezes. He has no rales. He exhibits no tenderness.  Abdominal: Soft. Bowel sounds are normal. He exhibits no distension. There is no tenderness.  Musculoskeletal: Normal range of motion. He exhibits no edema or tenderness.  Lymphadenopathy:    He has no cervical adenopathy.  Neurological: He is alert and oriented to person, place, and time. Coordination normal.  Skin: Skin is warm and dry. No rash noted. No erythema.  Psychiatric: He has a normal mood and affect. His behavior is normal. Judgment and thought content normal.      Assessment and Plan   Nursing note and vitals reviewed.

## 2013-12-12 NOTE — Assessment & Plan Note (Signed)
Atypical chest pain at nighttime. Likely musculoskeletal. No symptoms with exertion. No further workup at this time.

## 2013-12-12 NOTE — Assessment & Plan Note (Signed)
3 drug-eluting stents placed to his RCA in April 2015. We have recommended that he not stop his antiplatelet medications until at least April 2016. He was hoping to do surgery in early 2016 on his left shoulder

## 2013-12-12 NOTE — Patient Instructions (Signed)
You are doing well. No medication changes were made.  Please call us if you have new issues that need to be addressed before your next appt.  Your physician wants you to follow-up in: 6 months.  You will receive a reminder letter in the mail two months in advance. If you don't receive a letter, please call our office to schedule the follow-up appointment.   

## 2013-12-12 NOTE — Assessment & Plan Note (Signed)
Moderate discomfort in his left shoulder. He was considering shoulder surgery with Dr. Marry Guan in early 2016. Suggested if possible, if symptoms allow, that he wait until after April 2016 given 3 eluding stents placed April 2015.

## 2013-12-12 NOTE — Assessment & Plan Note (Signed)
Blood pressure is well controlled on today's visit. No changes made to the medications. 

## 2013-12-12 NOTE — Assessment & Plan Note (Signed)
No recent smoking. Likely has underlying COPD

## 2013-12-12 NOTE — Assessment & Plan Note (Signed)
We'll continue aggressive cholesterol management. Lipids pending Repeat carotid ultrasound next year 05/2013 Carotid U/S; bilat 40-50% ICA stenosis.

## 2013-12-15 ENCOUNTER — Ambulatory Visit: Payer: Self-pay | Admitting: Cardiovascular Disease

## 2013-12-15 LAB — LIPID PANEL
Cholesterol: 122 mg/dL (ref 0–200)
HDL Cholesterol: 51 mg/dL (ref 40–60)
Ldl Cholesterol, Calc: 56 mg/dL (ref 0–100)
Triglycerides: 76 mg/dL (ref 0–200)
VLDL Cholesterol, Calc: 15 mg/dL (ref 5–40)

## 2013-12-15 LAB — HEPATIC FUNCTION PANEL A (ARMC)
ALT: 19 U/L
Albumin: 3.6 g/dL (ref 3.4–5.0)
Alkaline Phosphatase: 54 U/L
BILIRUBIN TOTAL: 0.6 mg/dL (ref 0.2–1.0)
Bilirubin, Direct: 0.1 mg/dL (ref 0.0–0.2)
SGOT(AST): 14 U/L — ABNORMAL LOW (ref 15–37)
Total Protein: 7.5 g/dL (ref 6.4–8.2)

## 2014-01-05 ENCOUNTER — Emergency Department: Payer: Self-pay | Admitting: Emergency Medicine

## 2014-01-05 ENCOUNTER — Telehealth: Payer: Self-pay

## 2014-01-05 LAB — BASIC METABOLIC PANEL
Anion Gap: 6 — ABNORMAL LOW (ref 7–16)
BUN: 19 mg/dL — AB (ref 7–18)
CHLORIDE: 105 mmol/L (ref 98–107)
Calcium, Total: 8.7 mg/dL (ref 8.5–10.1)
Co2: 27 mmol/L (ref 21–32)
Creatinine: 1.36 mg/dL — ABNORMAL HIGH (ref 0.60–1.30)
EGFR (Non-African Amer.): 55 — ABNORMAL LOW
GLUCOSE: 107 mg/dL — AB (ref 65–99)
Osmolality: 278 (ref 275–301)
Potassium: 3.9 mmol/L (ref 3.5–5.1)
SODIUM: 138 mmol/L (ref 136–145)

## 2014-01-05 LAB — CBC
HCT: 34.1 % — AB (ref 40.0–52.0)
HGB: 10.3 g/dL — ABNORMAL LOW (ref 13.0–18.0)
MCH: 20.9 pg — AB (ref 26.0–34.0)
MCHC: 30.2 g/dL — ABNORMAL LOW (ref 32.0–36.0)
MCV: 69 fL — AB (ref 80–100)
Platelet: 440 10*3/uL (ref 150–440)
RBC: 4.93 10*6/uL (ref 4.40–5.90)
RDW: 20 % — ABNORMAL HIGH (ref 11.5–14.5)
WBC: 4.9 10*3/uL (ref 3.8–10.6)

## 2014-01-05 LAB — TROPONIN I

## 2014-01-05 NOTE — Telephone Encounter (Signed)
Spoke w/ pt.  He reports "excruciating chest pain".  Reports that he is currently at [facility], and they are advising him to go to the ED.  Pt states that he does not want to wait in the ED and wants to be treated in our office.  Advised pt to proceed to ED, as we cannot treat him in the office, we have no openings and we are short staffed at the moment, as it is lunchtime.   He verbalizes understanding and will proceed to the ED.

## 2014-01-23 DIAGNOSIS — Z9289 Personal history of other medical treatment: Secondary | ICD-10-CM

## 2014-01-23 HISTORY — DX: Personal history of other medical treatment: Z92.89

## 2014-03-01 ENCOUNTER — Observation Stay: Payer: Self-pay | Admitting: Internal Medicine

## 2014-03-01 LAB — TROPONIN I: Troponin-I: <0.02 ng/mL

## 2014-03-01 LAB — COMPREHENSIVE METABOLIC PANEL
ALBUMIN: 3.5 g/dL (ref 3.4–5.0)
ALK PHOS: 49 U/L (ref 46–116)
ALT: 33 U/L (ref 14–63)
Anion Gap: 6 — ABNORMAL LOW (ref 7–16)
BUN: 17 mg/dL (ref 7–18)
Bilirubin,Total: 0.4 mg/dL (ref 0.2–1.0)
CALCIUM: 8.8 mg/dL (ref 8.5–10.1)
Chloride: 107 mmol/L (ref 98–107)
Co2: 26 mmol/L (ref 21–32)
Creatinine: 1.23 mg/dL (ref 0.60–1.30)
EGFR (African American): >60
EGFR (Non-African Amer.): >60
GLUCOSE: 93 mg/dL (ref 65–99)
Osmolality: 279 (ref 275–301)
Potassium: 4.2 mmol/L (ref 3.5–5.1)
SGOT(AST): 30 U/L (ref 15–37)
Sodium: 139 mmol/L (ref 136–145)
Total Protein: 7.5 g/dL (ref 6.4–8.2)

## 2014-03-01 LAB — CBC
HCT: 34 % — AB (ref 40.0–52.0)
HGB: 10.3 g/dL — AB (ref 13.0–18.0)
MCH: 20.4 pg — ABNORMAL LOW (ref 26.0–34.0)
MCHC: 30.4 g/dL — AB (ref 32.0–36.0)
MCV: 67 fL — ABNORMAL LOW (ref 80–100)
Platelet: 304 10*3/uL (ref 150–440)
RBC: 5.06 10*6/uL (ref 4.40–5.90)
RDW: 19.4 % — ABNORMAL HIGH (ref 11.5–14.5)
WBC: 5.6 10*3/uL (ref 3.8–10.6)

## 2014-03-01 LAB — CK TOTAL AND CKMB (NOT AT ARMC)
CK, TOTAL: 114 U/L (ref 39–308)
CK, TOTAL: 92 U/L (ref 39–308)
CK, Total: 96 U/L (ref 39–308)
CK-MB: 1.2 ng/mL (ref 0.5–3.6)
CK-MB: 1.3 ng/mL (ref 0.5–3.6)
CK-MB: 1.2 ng/mL (ref 0.5–3.6)

## 2014-03-02 DIAGNOSIS — I361 Nonrheumatic tricuspid (valve) insufficiency: Secondary | ICD-10-CM

## 2014-03-02 DIAGNOSIS — R0602 Shortness of breath: Secondary | ICD-10-CM

## 2014-03-02 DIAGNOSIS — R079 Chest pain, unspecified: Secondary | ICD-10-CM

## 2014-03-02 DIAGNOSIS — I10 Essential (primary) hypertension: Secondary | ICD-10-CM

## 2014-03-02 LAB — CBC WITH DIFFERENTIAL/PLATELET
BASOS ABS: 0 10*3/uL (ref 0.0–0.1)
BASOS PCT: 0.9 %
Eosinophil #: 0.2 10*3/uL (ref 0.0–0.7)
Eosinophil %: 3.2 %
HCT: 29 % — ABNORMAL LOW (ref 40.0–52.0)
HGB: 8.9 g/dL — ABNORMAL LOW (ref 13.0–18.0)
LYMPHS PCT: 41 %
Lymphocyte #: 2.2 10*3/uL (ref 1.0–3.6)
MCH: 20.3 pg — AB (ref 26.0–34.0)
MCHC: 30.6 g/dL — ABNORMAL LOW (ref 32.0–36.0)
MCV: 66 fL — ABNORMAL LOW (ref 80–100)
Monocyte #: 0.5 x10 3/mm (ref 0.2–1.0)
Monocyte %: 10.2 %
Neutrophil #: 2.4 10*3/uL (ref 1.4–6.5)
Neutrophil %: 44.7 %
PLATELETS: 264 10*3/uL (ref 150–440)
RBC: 4.37 10*6/uL — AB (ref 4.40–5.90)
RDW: 19.8 % — ABNORMAL HIGH (ref 11.5–14.5)
WBC: 5.4 10*3/uL (ref 3.8–10.6)

## 2014-03-02 LAB — BASIC METABOLIC PANEL
Anion Gap: 8 (ref 7–16)
BUN: 18 mg/dL (ref 7–18)
CALCIUM: 8.2 mg/dL — AB (ref 8.5–10.1)
Chloride: 106 mmol/L (ref 98–107)
Co2: 27 mmol/L (ref 21–32)
Creatinine: 1.29 mg/dL (ref 0.60–1.30)
EGFR (African American): >60
GFR CALC NON AF AMER: 58 — AB
Glucose: 90 mg/dL (ref 65–99)
Osmolality: 283 (ref 275–301)
POTASSIUM: 4 mmol/L (ref 3.5–5.1)
Sodium: 141 mmol/L (ref 136–145)

## 2014-03-18 ENCOUNTER — Ambulatory Visit: Payer: Self-pay | Admitting: Internal Medicine

## 2014-04-04 ENCOUNTER — Observation Stay: Payer: Self-pay | Admitting: Internal Medicine

## 2014-04-05 DIAGNOSIS — I255 Ischemic cardiomyopathy: Secondary | ICD-10-CM | POA: Diagnosis not present

## 2014-04-05 DIAGNOSIS — R079 Chest pain, unspecified: Secondary | ICD-10-CM | POA: Diagnosis not present

## 2014-04-06 ENCOUNTER — Telehealth: Payer: Self-pay | Admitting: *Deleted

## 2014-04-06 DIAGNOSIS — R079 Chest pain, unspecified: Secondary | ICD-10-CM

## 2014-04-06 NOTE — Telephone Encounter (Signed)
Pt calling asking for [place] to set up a Stress test, some time early this week. Per Dr. Aundra Dubin  Stated to him that Nurse would call him back with a time and date Pt understood and was okay with this.

## 2014-04-06 NOTE — Telephone Encounter (Signed)
Spoke w/ pt.  Pt was hospitalized over the weekend, and per Dr. Fletcher Anon, pt will need lexi early this week.  Advised him that lexiscan has been sched for 04/08/14 @ 7:30.   Reviewed instructions w/ him.  He verbalizes understanding and is agreeable.   [place]  Your caregiver has ordered a Stress Test with nuclear imaging. The purpose of this test is to evaluate the blood supply to your heart muscle. This procedure is referred to as a "Non-Invasive Stress Test." This is because other than having an IV started in your vein, nothing is inserted or "invades" your body. Cardiac stress tests are done to find areas of poor blood flow to the heart by determining the extent of coronary artery disease (CAD). Some patients exercise on a treadmill, which naturally increases the blood flow to your heart, while others who are  unable to walk on a treadmill due to physical limitations have a pharmacologic/chemical stress agent called Lexiscan . This medicine will mimic walking on a treadmill by temporarily increasing your coronary blood flow.   Please note: these test may take anywhere between 2-4 hours to complete  PLEASE REPORT TO [place] AT THE FIRST DESK WILL DIRECT YOU WHERE TO GO  Date of Procedure:_______Wednesday, March 16____________  Arrival Time for Procedure:_______7:45____________________  Instructions regarding medication:   __X__:  Hold betablocker(s) night before procedure and morning of procedure:  METOPROLOL  How to prepare for your Myoview test:  1. Do not eat or drink after midnight 2. No caffeine for 24 hours prior to test 3. No smoking 24 hours prior to test. 4. Your medication may be taken with water.  If your doctor stopped a medication because of this test, do not take that medication. 5. Ladies, please do not wear dresses.  Skirts or pants are appropriate. Please wear a short sleeve shirt. 6. No perfume, cologne or lotion.

## 2014-04-07 ENCOUNTER — Telehealth: Payer: Self-pay | Admitting: Cardiovascular Disease

## 2014-04-07 ENCOUNTER — Other Ambulatory Visit: Payer: Self-pay

## 2014-04-07 ENCOUNTER — Other Ambulatory Visit (INDEPENDENT_AMBULATORY_CARE_PROVIDER_SITE_OTHER): Payer: Medicare Other

## 2014-04-07 DIAGNOSIS — R079 Chest pain, unspecified: Secondary | ICD-10-CM

## 2014-04-07 NOTE — Telephone Encounter (Signed)
Please call patient for reassurance that  If something is critical that we will not wait 2 weeks to let him know.

## 2014-04-07 NOTE — Telephone Encounter (Signed)
Patient is having echo today and concerned about waiting 2 weeks until appt. To get results.

## 2014-04-08 ENCOUNTER — Ambulatory Visit: Payer: Self-pay | Admitting: Cardiovascular Disease

## 2014-04-08 ENCOUNTER — Telehealth: Payer: Self-pay

## 2014-04-08 DIAGNOSIS — R079 Chest pain, unspecified: Secondary | ICD-10-CM | POA: Diagnosis not present

## 2014-04-08 NOTE — Telephone Encounter (Signed)
Pt c/o Shortness Of Breath: STAT if SOB developed within the last 24 hours or pt is noticeably SOB on the phone  1. Are you currently SOB (can you hear that pt is SOB on the phone)? Yes, but I cant hear  2. How long have you been experiencing SOB? Since early afternoon  3. Are you SOB when sitting or when up moving around? Moving around  4. Are you currently experiencing any other symptoms? no

## 2014-04-08 NOTE — Telephone Encounter (Signed)
Spoke w/ pt.  He reports SOB after walking in his yard and is wondering if this is a side effect of his lexi earlier today.  Asked pt if this was a new problem, he states "No, that's why we are doing all of these tests, to figure out why". Advised pt that we will call him w/ his test results as soon as they are available, but to call us w/ any new problems before that time.

## 2014-04-09 ENCOUNTER — Other Ambulatory Visit: Payer: Self-pay

## 2014-04-09 DIAGNOSIS — R079 Chest pain, unspecified: Secondary | ICD-10-CM

## 2014-04-10 ENCOUNTER — Other Ambulatory Visit: Payer: Self-pay

## 2014-04-10 ENCOUNTER — Other Ambulatory Visit (INDEPENDENT_AMBULATORY_CARE_PROVIDER_SITE_OTHER): Payer: Medicare Other

## 2014-04-10 ENCOUNTER — Telehealth: Payer: Self-pay | Admitting: Cardiovascular Disease

## 2014-04-10 DIAGNOSIS — R0602 Shortness of breath: Secondary | ICD-10-CM

## 2014-04-10 DIAGNOSIS — Z01812 Encounter for preprocedural laboratory examination: Secondary | ICD-10-CM

## 2014-04-10 DIAGNOSIS — R9439 Abnormal result of other cardiovascular function study: Secondary | ICD-10-CM

## 2014-04-10 NOTE — Telephone Encounter (Signed)
Patient came to office early to pick up cath instructions.  No staff available at this time to check in early.  Patient was instructed to come back at 3:50 as scheduled.  Please call patient at [employer] [facility] if he is needed sooner.

## 2014-04-11 LAB — CBC WITH DIFFERENTIAL/PLATELET
Basophils Absolute: 0 10*3/uL (ref 0.0–0.2)
Basos: 1 %
EOS: 4 %
Eosinophils Absolute: 0.2 10*3/uL (ref 0.0–0.4)
HEMATOCRIT: 32.4 % — AB (ref 37.5–51.0)
Hemoglobin: 9.7 g/dL — ABNORMAL LOW (ref 12.6–17.7)
Immature Grans (Abs): 0 10*3/uL (ref 0.0–0.1)
Immature Granulocytes: 0 %
LYMPHS ABS: 1.6 10*3/uL (ref 0.7–3.1)
Lymphs: 37 %
MCH: 21.2 pg — ABNORMAL LOW (ref 26.6–33.0)
MCHC: 29.9 g/dL — AB (ref 31.5–35.7)
MCV: 71 fL — AB (ref 79–97)
MONOCYTES: 9 %
MONOS ABS: 0.4 10*3/uL (ref 0.1–0.9)
NEUTROS ABS: 2.2 10*3/uL (ref 1.4–7.0)
NEUTROS PCT: 49 %
PLATELETS: 238 10*3/uL (ref 150–379)
RBC: 4.58 x10E6/uL (ref 4.14–5.80)
RDW: 21.7 % — ABNORMAL HIGH (ref 12.3–15.4)
WBC: 4.4 10*3/uL (ref 3.4–10.8)

## 2014-04-11 LAB — PROTIME-INR
INR: 1 (ref 0.8–1.2)
Prothrombin Time: 10.7 s (ref 9.1–12.0)

## 2014-04-11 LAB — BASIC METABOLIC PANEL
BUN / CREAT RATIO: 18 (ref 10–22)
BUN: 23 mg/dL (ref 8–27)
CALCIUM: 8.5 mg/dL — AB (ref 8.6–10.2)
CO2: 18 mmol/L (ref 18–29)
CREATININE: 1.25 mg/dL (ref 0.76–1.27)
Chloride: 108 mmol/L (ref 97–108)
GFR calc Af Amer: 67 mL/min/{1.73_m2} (ref >59)
GFR calc non Af Amer: 58 mL/min/{1.73_m2} — ABNORMAL LOW (ref >59)
GLUCOSE: 119 mg/dL — AB (ref 65–99)
Potassium: 4.4 mmol/L (ref 3.5–5.2)
SODIUM: 140 mmol/L (ref 134–144)

## 2014-04-13 ENCOUNTER — Ambulatory Visit: Payer: Self-pay | Admitting: Cardiovascular Disease

## 2014-04-13 DIAGNOSIS — I25110 Atherosclerotic heart disease of native coronary artery with unstable angina pectoris: Secondary | ICD-10-CM

## 2014-04-13 HISTORY — PX: CORONARY ANGIOPLASTY WITH STENT PLACEMENT: SHX49

## 2014-04-14 ENCOUNTER — Telehealth: Payer: Self-pay | Admitting: *Deleted

## 2014-04-14 ENCOUNTER — Other Ambulatory Visit: Payer: Self-pay | Admitting: Physician Assistant

## 2014-04-14 ENCOUNTER — Encounter: Payer: Self-pay | Admitting: Cardiovascular Disease

## 2014-04-14 DIAGNOSIS — R0602 Shortness of breath: Secondary | ICD-10-CM

## 2014-04-14 DIAGNOSIS — I2510 Atherosclerotic heart disease of native coronary artery without angina pectoris: Secondary | ICD-10-CM

## 2014-04-14 MED ORDER — METOPROLOL SUCCINATE ER 25 MG PO TB24: 25.0000 mg | ORAL_TABLET | Freq: Every day | ORAL | Status: DC

## 2014-04-14 NOTE — Telephone Encounter (Signed)
Pt calling stating on his discharge papers he was told to stop taking Meteprolol Wants to make sure of this.  Please advise.

## 2014-04-14 NOTE — Telephone Encounter (Signed)
Patient contacted regarding discharge from [facility] on 04/14/14.  Patient understands to follow up with Dr. Rockey Situ on 04/20/14 at 3:15 at [facility]. Patient understands discharge instructions? yes Patient understands medications and regiment? yes Patient understands to bring all medications to this visit? yes  Clarified pt's metoprolol dose w/ him. Advised him that he is to stop metoprolol 50 mg and take 25 mg daily instead.  He verbalizes understanding.

## 2014-04-20 ENCOUNTER — Ambulatory Visit (INDEPENDENT_AMBULATORY_CARE_PROVIDER_SITE_OTHER): Payer: Medicare Other | Admitting: Cardiovascular Disease

## 2014-04-20 ENCOUNTER — Encounter: Payer: Self-pay | Admitting: Cardiovascular Disease

## 2014-04-20 VITALS — BP 112/62 | HR 63 | Ht 70.0 in | Wt 179.5 lb

## 2014-04-20 DIAGNOSIS — R079 Chest pain, unspecified: Secondary | ICD-10-CM

## 2014-04-20 DIAGNOSIS — Z955 Presence of coronary angioplasty implant and graft: Secondary | ICD-10-CM

## 2014-04-20 DIAGNOSIS — R0602 Shortness of breath: Secondary | ICD-10-CM

## 2014-04-20 DIAGNOSIS — E785 Hyperlipidemia, unspecified: Secondary | ICD-10-CM

## 2014-04-20 DIAGNOSIS — D649 Anemia, unspecified: Secondary | ICD-10-CM | POA: Insufficient documentation

## 2014-04-20 DIAGNOSIS — I2510 Atherosclerotic heart disease of native coronary artery without angina pectoris: Secondary | ICD-10-CM | POA: Diagnosis not present

## 2014-04-20 DIAGNOSIS — I10 Essential (primary) hypertension: Secondary | ICD-10-CM

## 2014-04-20 DIAGNOSIS — D500 Iron deficiency anemia secondary to blood loss (chronic): Secondary | ICD-10-CM

## 2014-04-20 NOTE — Patient Instructions (Signed)
You are doing well. No medication changes were made.  Please call us if you have new issues that need to be addressed before your next appt.  Your physician wants you to follow-up in: 6 months.  You will receive a reminder letter in the mail two months in advance. If you don't receive a letter, please call our office to schedule the follow-up appointment.   

## 2014-04-20 NOTE — Assessment & Plan Note (Signed)
Recent stent placed to his proximal LAD with good result. Recommended that he stay on his aspirin and Effient.

## 2014-04-20 NOTE — Assessment & Plan Note (Signed)
He takes one iron pill per day. Recommended he talk with Dr. Gilford Rile to determine if more iron as needed. Suggested he delay any colonoscopy or EGD as he needs to stay on Effient for at least one year

## 2014-04-20 NOTE — Progress Notes (Signed)
Patient ID: Scott [name], male    DOB: 1940-10-30, 74 y.o.   MRN: [id number]  HPI Comments: [name] is a pleasant 75 year old gentleman with history of CAD, cardiac catheterization April 2014 showing severe distal RCA disease, with left to right collaterals, subtotally occluded proximal left circumflex, 50% proximal LAD, 40% mid LAD disease, ejection fraction at that time 35%, echocardiogram showing ejection fraction 40-45% , shortness of breath on his last clinic visit, now presenting after recent stenting to his RCA. He did smoke from age [age], total of 40 years. Stopped smoking many years ago. In 2011 Active at baseline, grows hay He presents for routine follow-up of his coronary artery disease following catheterization  [name] had stress testing 04/08/2014 showing anterior wall ischemia Catheterization scheduled 04/13/2014, FFR of the LAD showed stigmata GERD disease in the mid LAD. Estimated at 80% he hadXience 3.0 x 23 mm stent placed with good result  In follow-up today, he reports that he feels fine, breathing has improved. He does not think that he has a respiratory issue at this time. Taking his Effient faithfully with aspirin. No notable GI bleeding. Scheduled to see GI tomorrow for anemia. He continues to have some atypical chest pain when he lays down in bed flat/supine  EKG on today's visit shows normal sinus rhythm with rate 63 bpm, intraventricular conduction delay  Lab work in the hospital arch 13 2016 showing total cholesterol 142, LDL 77, HDL 47  Echocardiogram 03/02/2014 showing normal LV function, essentially normal study  Other past medical history He reports having significant pain in his left shoulder though has full range of motion. Initially was thinking of having surgery in early 2016. Surgeon would be Dr. Marry Guan.   March of 2014, he developed shortness of breath. He had catheterization as detailed above.  increasing shortness of breath and chest pain  over the past several months. He did report having some chest pain in January of 2015 his outside cardiologist. No intervention planned, medical management recommended.  We scheduled a repeat cardiac catheterization 05/16/2013. His showed severe mid to distal RCA disease, severe subtotally occluded proximal left circumflex disease, left to right collaterals from the mid to distal left circumflex, mild to moderate proximal LAD disease, ejection fraction 50%. After long discussion with Dr. Cecelia Byars it was felt that he would be best served by intervention on his RCA rather than the left circumflex which was a smaller vessel. There was no significant targets for CABG and LAD grafting was not needed.   Echocardiogram 04/22/2012 showing ejection fraction 40%, no mention of wall motion abnormality, mild MR, TR Stress test 04/25/2012 with no significant ischemia. Perfusion defect consistent with infarct in the inferolateral wall, ejection fraction 33% rest mother skin July 2014 showing ejection fraction 40%, no wall motion abnormality  He reports he is active over the winter, does woodworking but no aerobic. 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     No Known Allergies  Outpatient Encounter Prescriptions as of 04/20/2014  Medication Sig  . aspirin EC 81 MG tablet Take 1 tablet (81 mg total) by mouth daily.  Marland Kitchen losartan (COZAAR) 50 MG tablet Take 50 mg by mouth daily.  . metoprolol succinate (TOPROL XL) 25 MG 24 hr tablet Take 1 tablet (25 mg total) by mouth daily.  . nitroGLYCERIN (NITROSTAT) 0.4 MG SL tablet Place 1 tablet (0.4 mg total) under the tongue every 5 (five) minutes as needed for  chest pain.  . prasugrel (EFFIENT) 10 MG TABS tablet Take 1 tablet (10 mg total) by mouth daily.  . simvastatin (ZOCOR) 40 MG tablet Take 40 mg by mouth daily.    Past Medical History  Diagnosis Date  . CAD (coronary artery disease)     a. 04/2012 Cath: 3VD->Med Rx;  b.  04/2013 Cath/PCI: LAD 50p, D1 40, LCX 80p, OM2 99diff, RCA 70p, 74m, 95d. Mid & distal RCA treated with 2 DES.  . Cardiomyopathy, ischemic     a. 04/2012 Echo: EF 40-45%;  b. 08/2013 Echo: EF 45-50%, mild glob HK, mod lat/post HK, diast dysfxn, mildly dil LA, mild MR, nl RVSP.  Marland Kitchen Hyperlipidemia   . HTN (hypertension)   . Carotid arterial disease     a. 05/2013 Carotid U/S; bilat 40-50% ICA stenosis.    Past Surgical History  Procedure Laterality Date  . Right shoulder    . Carpal tunnel release      right hand  . Tonsillectomy    . Cardiac catheterization  05/01/2012  . Cardiac catheterization  04/2013    [facility];x3 stent  . Coronary angioplasty with stent placement  04/13/2014    Social History  reports that he has quit smoking. His smoking use included Cigarettes. He started smoking about 4 years ago. He has a 35 pack-year smoking history. He does not have any smokeless tobacco history on file. He reports that he does not drink alcohol or use illicit drugs.  Family History family history includes Heart attack (age of onset: 31) in his father.  Review of Systems  Constitutional: Negative.   Respiratory: Negative.   Cardiovascular: Negative.   Gastrointestinal: Negative.   Musculoskeletal: Negative.   Neurological: Negative.   Hematological: Negative.   All other systems reviewed and are negative.   BP 112/62 mmHg  Pulse 63  Ht 5\' 10"  (1.778 m)  Wt 179 lb 8 oz (81.421 kg)  BMI 25.76 kg/m2  Physical Exam  Constitutional: He is oriented to person, place, and time. He appears well-developed and well-nourished.  HENT:  Head: Normocephalic.  Nose: Nose normal.  Mouth/Throat: Oropharynx is clear and moist.  Eyes: Conjunctivae are normal. Pupils are equal, round, and reactive to light.  Neck: Normal range of motion. Neck supple. No JVD present.  Cardiovascular: Normal rate, regular rhythm, S1 normal, S2 normal and intact distal pulses.  Exam reveals no gallop and no friction rub.    Murmur heard.  Systolic murmur is present with a grade of 2/6  Pulmonary/Chest: Effort normal and breath sounds normal. No respiratory distress. He has no wheezes. He has no rales. He exhibits no tenderness.  Abdominal: Soft. Bowel sounds are normal. He exhibits no distension. There is no tenderness.  Musculoskeletal: Normal range of motion. He exhibits no edema or tenderness.  Lymphadenopathy:    He has no cervical adenopathy.  Neurological: He is alert and oriented to person, place, and time. Coordination normal.  Skin: Skin is warm and dry. No rash noted. No erythema.  Psychiatric: He has a normal mood and affect. His behavior is normal. Judgment and thought content normal.      Assessment and Plan   Nursing note and vitals reviewed.

## 2014-04-20 NOTE — Assessment & Plan Note (Signed)
Cholesterol is at goal on the current lipid regimen. No changes to the medications were made.  

## 2014-04-20 NOTE — Assessment & Plan Note (Signed)
Blood pressure is well controlled on today's visit. No changes made to the medications. 

## 2014-04-20 NOTE — Assessment & Plan Note (Signed)
Recommended that he stay on low-dose aspirin and Effient for at least one year

## 2014-04-20 NOTE — Assessment & Plan Note (Signed)
Previous shortness of breath symptoms likely multifactorial including ischemia, underlying lung disease from COPD, anemia He reports symptoms have improved on today's visit

## 2014-04-20 NOTE — Assessment & Plan Note (Signed)
He continues to have some atypical chest pain at nighttime when supine at rest. Likely atypical in nature

## 2014-04-24 ENCOUNTER — Other Ambulatory Visit: Payer: Self-pay | Admitting: *Deleted

## 2014-05-08 ENCOUNTER — Other Ambulatory Visit: Payer: Self-pay | Admitting: Specialist

## 2014-05-08 DIAGNOSIS — R911 Solitary pulmonary nodule: Secondary | ICD-10-CM

## 2014-05-12 NOTE — Op Note (Signed)
PATIENT NAME:  Scott, [name] MR#:  [id number] DATE OF BIRTH:  1940-02-17  DATE OF PROCEDURE:  08/11/2011  PREOPERATIVE DIAGNOSIS: Left carpal tunnel syndrome.   POSTOPERATIVE DIAGNOSIS: Left carpal tunnel syndrome.  PROCEDURE PERFORMED: Left carpal tunnel release.   SURGEON: Laurice Record. Holley Bouche., MD  ANESTHESIA: General.   ESTIMATED BLOOD LOSS: Minimal.   TOURNIQUET TIME: 30 minutes.   DRAINS: None.   INDICATIONS FOR SURGERY: The patient is a 74 year old male who has been seen for complaints of numbness and paresthesias to the left hand. Previous nerve conduction studies were consistent with moderately severe carpal tunnel syndrome. After discussion of the risks and benefits of surgical intervention, the patient expressed his understanding of the risks and benefits and agreed with plans for surgical intervention.   PROCEDURE IN DETAIL: Patient was brought into the Operating Room and, after adequate general anesthesia was achieved, a tourniquet was placed on the patient's upper left arm. Patient's left hand and arm were cleaned and prepped with alcohol and DuraPrep, draped in the usual sterile fashion. A "timeout" was performed as per usual protocol. The left upper extremity was exsanguinated using Esmarch, and the tourniquet was inflated to 250 mmHg. Loupe magnification was used throughout the procedure. Curvilinear incision was made along the thenar palmar crease. Dissection was carried down through the palmar fascia to the transverse carpal ligament. Transverse carpal ligament was sharply incised, taking care to protect the underlying structures within the carpal tunnel. Complete release of the transverse carpal ligament was achieved. Inspection of the tunnel showed no evidence of lipoma or a ganglion cyst. The wound was irrigated with copious amounts of normal saline with antibiotic solution. Skin edges were reapproximated using interrupted sutures of 5-0 nylon. 10 mL of 0.25% Marcaine was  injected along the incision site. A sterile dressing was applied followed by application of a volar splint. Tourniquet was deflated after total tourniquet time of 30 minutes.   Patient tolerated procedure well. He was transported to the recovery room in stable condition.   ____________________________ Laurice Record. Holley Bouche., MD jph:cms D: 08/11/2011 08:52:10 ET T: 08/11/2011 11:35:25 ET JOB#: [id number]  cc: Jeneen Rinks P. Holley Bouche., MD, <Dictator> JAMES P Holley Bouche MD ELECTRONICALLY SIGNED 08/13/2011 8:12

## 2014-05-15 NOTE — Op Note (Signed)
PATIENT NAME:  Scott [name], Scott [name] MR#:  [id number] DATE OF BIRTH:  October 02, 1940  DATE OF PROCEDURE:  08/22/2012  PREOPERATIVE DIAGNOSIS:  Left de Quervain's stenosing tenosynovitis.   POSTOPERATIVE DIAGNOSIS:  Left de Quervain's stenosing tenosynovitis.   PROCEDURE:  Left de Quervain's release.   ANESTHESIA:  General.   SURGEON:  Laurene Footman, M.D.   DESCRIPTION OF PROCEDURE:  The patient was brought to the Operating Room and after adequate general anesthesia was obtained, the left arm was prepped and draped in the usual sterile fashion with a tourniquet applied to the upper arm.  After patient identification, timeout procedures were completed, the tourniquet was raised to 250 mmHg.  An incision was then made over the radial styloid just through the skin.  A hemostat was then used to spread the tissue to preserve branches of superficial radial nerve.  The overlying extensor retinaculum was then identified and incised and there was a large gush of fluid consistent with chronic tenosynovitis.  With care being taken to visualize the tissue surrounding the retinaculum, release was carried out first proximally and there was no significant band between the tendons of the EPB and EPL and then distal release was carried out so there was no compression on the tendons.  The tendons themselves appeared to have been compressed, but were not frayed and did not require debridement.  After release proximally and distally, the wound was thoroughly irrigated.  The wound was closed with simple interrupted 4-0 nylon suture with 10 mL of 0.5% Sensorcaine proximal to the wound to aid in postop analgesia.  A soft dressing was then applied with Xeroform, 4 x 4's, Webril and an Ace wrap and the patient was sent to the recovery room in stable condition.   ESTIMATED BLOOD LOSS:  Minimal.   COMPLICATIONS:  None.   SPECIMEN:  None.   TOURNIQUET TIME:  12 minutes at 250 mmHg.     ____________________________ Laurene Footman, MD mjm:ea D: 08/22/2012 23:20:17 ET T: 08/22/2012 23:41:01 ET JOB#: [id number]  cc: Laurene Footman, MD, <Dictator> Laurene Footman MD ELECTRONICALLY SIGNED 08/23/2012 12:52

## 2014-05-16 NOTE — Discharge Summary (Signed)
Dates of Admission and Diagnosis:  Date of Admission 16-May-2013   Date of Discharge 17-May-2013   Admitting Diagnosis CAD with class 3 angina   Final Diagnosis same    Chief Complaint/History of Present Illness This is a 74 year old male with known history of CAD and hyperlipidemia. He presented with worsening exertional dyspnea and chest pain in spite of medical therapy. Thus, repeat cardiac cath was performed by Dr. Rockey Situ which showed mild nonobstructive disease in LAD with chronically occluded mid LCX (small diameter) and subtotal diffuse RCA disease. After extensive discussion, it was decided to proceed with RCA PCI which was done without complications. 3 overlapped DESs were placed.  No complications overnight. No groin hematoma.  He will be discharged home on same medications in addition to Effient which should continue for at least 1 year and ideally longer.   Allergies:  No Known Allergies:   Routine Chem:  25-Apr-15 03:58   Glucose, Serum  101  BUN 16  Creatinine (comp) 1.15  Sodium, Serum 136  Potassium, Serum 4.1  Chloride, Serum 104  CO2, Serum 27  Calcium (Total), Serum 8.6  Anion Gap  5  Osmolality (calc) 273  eGFR (African American) >60  eGFR (Non-African American) >60 (eGFR values <39m/min/1.73 m2 may be an indication of chronic kidney disease (CKD). Calculated eGFR is useful in patients with stable renal function. The eGFR calculation will not be reliable in acutely ill patients when serum creatinine is changing rapidly. It is not useful in  patients on dialysis. The eGFR calculation may not be applicable to patients at the low and high extremes of body sizes, pregnant women, and vegetarians.)   PERTINENT RADIOLOGY STUDIES: XRay:    17-Apr-15 11:17, Chest PA and Lateral  Chest PA and Lateral   REASON FOR EXAM:    Pre-Cath Chest pain  COMMENTS:       PROCEDURE: DXR - DXR CHEST PA (OR AP) AND LATERAL  - May 09 2013 11:17AM     CLINICAL DATA:  Pre  catheterization.  Chest pain.    EXAM:  CHEST  2 VIEW    COMPARISON:  01/29/2009    FINDINGS:  The heart size and mediastinal contours are within normal limits.  Both lungs are clear. The visualized skeletal structures are  unremarkable.     IMPRESSION:  No active cardiopulmonary disease.      Electronically Signed    By: DLajean ManesM.D.    On: 05/09/2013 11:18         Verified By: DLasandra Beech M.D.,   Pertinent Past History:  Pertinent Past History CAD and hyperlipidemia   Hospital Course:  Hospital Course As above.   Condition on Discharge Good   Code Status:  Code Status Full Code   DISCHARGE INSTRUCTIONS HOME MEDS:  Medication Reconciliation: Patient's Home Medications at Discharge:     Medication Instructions  simvastatin 40 mg oral tablet  1 tab(s) orally once a day (at bedtime)   metoprolol succinate 50 mg oral tablet, extended release  1 tab(s) orally once a day (in the evening)   losartan 50 mg oral tablet  1 tab(s) orally once a day   aspirin 81 mg oral delayed release tablet  1 tab(s) orally once a day   prasugrel 10 mg oral tablet  1 tab(s) orally once a day    PRESCRIPTIONS: ELECTRONICALLY SUBMITTED   Physician's Instructions:  Home Health? No   Treatments None   Dressing Care Remove dressing tomorrow.  May shower.   Home Oxygen? No   Diet Low Fat, Low Cholesterol   Activity Limitations As tolerated  No heavy lifting   Return to Work Not Applicable   Time frame for Follow Up Appointment 1-2 weeks  with Dr. Rockey Situ   Electronic Signatures: Kathlyn Sacramento (MD)  (Signed 25-Apr-15 09:32)  Authored: ADMISSION DATE AND DIAGNOSIS, CHIEF COMPLAINT/HPI, Allergies, PERTINENT LABS, PERTINENT RADIOLOGY STUDIES, PERTINENT PAST HISTORY, HOSPITAL COURSE, DISCHARGE INSTRUCTIONS HOME MEDS, PATIENT INSTRUCTIONS   Last Updated: 25-Apr-15 09:32 by Kathlyn Sacramento (MD)

## 2014-05-16 NOTE — H&P (Signed)
PATIENT NAME:  Scott [name], BUDAY MR#:  [id number] DATE OF BIRTH:  1940/03/25  DATE OF ADMISSION:  09/03/2013  PRIMARY CARE PROVIDER: Dr. Gilford Rile.   EMERGENCY DEPARTMENT REFERRING PHYSICIAN: Dr. Jasmine December.   CHIEF COMPLAINT: Chest pain.   HISTORY OF PRESENT ILLNESS: The patient is a 74 year old white male with history of known coronary artery disease, who underwent a cardiac catheterization on April 24 and underwent a stent, apparently 3 stents to the RCA. He also was noted to have severe proximal left circumflex disease and moderate proximal LAD disease, which they were unable to do any intervention to; who has been having occasional chest pains, but since this morning, he started having severe sharp chest pain. He still went to cardiac rehabilitation and has continued to have those symptoms; therefore, came to the ED.  He also has chronic left shoulder pain that is unchanged. He does not have any other radiation of the pain. He does not complain of any nausea or vomiting or shortness of breath. He denies any fevers or chills. Denies any urinary symptoms.   PAST MEDICAL HISTORY:  1.  Coronary artery disease as above, status post 3 stents.  2.  History of hyperlipidemia.  3.  Hypertension.   PAST SURGICAL HISTORY:  1.  Status post right shoulder surgery.  2.  Status post tonsillectomy and adenoidectomy.  3.  Status post carpal tunnel release.   ALLERGIES: NONE.   MEDICATIONS:  1.  Simvastatin 40 daily. 2.  Metoprolol 50 one tabs p.o. daily. 3.  Losartan 50 daily. 4.  Effient 10 mg daily. 5.  Aspirin 81 one tab daily.   SOCIAL HISTORY: Smoking, quit a few years ago. No alcohol or drug use.   FAMILY HISTORY: Father with coronary artery disease in his late 72s.   REVIEW OF SYSTEMS:  CONSTITUTIONAL: Denies any fevers, fatigue, weakness. Complains of chest pain. No weight loss. No weight gain. EYES:  No blurred or double vision. No pain. No redness. No inflammation. No glaucoma. No  cataracts.  EAR, NOSE, AND THROAT: No tinnitus. No ear pain. No hearing loss. No seasonal or year-round allergies. No epistaxis. No nasal discharge. No snoring. No postnasal drip. No sinus pain.  RESPIRATORY: Denies any cough, wheezing, hemoptysis. No dyspnea.  CARDIOVASCULAR: Complains of chest pain, but no orthopnea, edema, or arrhythmia.  GASTROINTESTINAL: Denies any nausea, vomiting, diarrhea. No abdominal pain. No hematemesis. No melena.  GENITOURINARY: Denies any dysuria, hematuria, renal calculus, frequency. ENDOCRINOLOGY: Denies any polyuria, nocturia, or thyroid problems.  HEMATOLOGIC AND LYMPHATIC: Denies anemia, easy bruisability or bleeding.  SKIN: No acne. No rash. No changes in hair, mole or skin.  MUSCULOSKELETAL: Denies any pain in the neck, back or shoulder.  NEUROLOGIC: No numbness, CVA, TIA, or seizures.  PSYCHIATRIC: Denies any anxiety, insomnia, or ADD.   PHYSICAL EXAMINATION:  VITAL SIGNS: Temperature 97.7, pulse 73, respirations 22, blood pressure 144/77, oxygen saturation 100%.  GENERAL: The patient in no acute distress.  HEENT: Head atraumatic, normocephalic. Pupils equally round, reactive to light and accommodation. There is no conjunctival pallor. No scleral icterus. Nasal exam shows no drainage or ulceration.  Oropharynx is clear without any exudate.  NECK: Supple without any JVD.  CARDIOVASCULAR: Regular rate and rhythm. No murmurs, rubs, clicks, or gallops.  LUNGS: Clear to auscultation bilaterally without any rales, rhonchi, wheezing.  ABDOMEN: Soft, nontender, nondistended. Positive bowel sounds x 4.  EXTREMITIES: No clubbing, cyanosis, or edema.  SKIN: No rash.  LYMPHATICS: Lymph nodes are nonpalpable.  VASCULAR: Good  DP and PT pulses.  PSYCHIATRIC: Not anxious or depressed. NEUROLOGIC: Awake, alert, oriented x 3. No focal deficits.   EVALUATIONS: EKG shows normal sinus rhythm with inferior infarct.   LABORATORIES: CPK 144. CK-MB 0.8. Troponin less than  0.02. WBC 4.1, hemoglobin 10.4, platelet count 273,000.   ASSESSMENT AND PLAN: The patient is a 74 year old white male with history of coronary artery disease,  3 stents to the RCA in April, also left circumflex disease, medically treated, presents with chest pain.   1.  Chest pain due to unstable angina. At this time, we will treat him with heparin drip. Cardiology consult. Continue Effient, aspirin, and metoprolol.  2.  Hyperlipidemia. Continue simvastatin.  3.  Miscellaneous. Patient will be on heparin drip, which should be sufficient for deep vein thrombosis prophylaxis.  TIME SPENT: 50 minutes.    ____________________________ [name], MD shp:TT D: 09/03/2013 12:41:13 ET T: 09/03/2013 12:55:18 ET JOB#: [id number]  cc: Coriann Brouhard H. Posey Pronto, MD, <Dictator> Alric Seton MD ELECTRONICALLY SIGNED 09/09/2013 14:19

## 2014-05-16 NOTE — Discharge Summary (Signed)
Dates of Admission and Diagnosis:  Date of Admission 03-Sep-2013   Date of Discharge 04-Sep-2013   Admitting Diagnosis chest pain/angina   Final Diagnosis Stable angina   Discharge Diagnosis 1 CAD, s/p stent   2 hyperlipidemia    Chief Complaint/History of Present Illness 74 y/o [name] with h/o obstructive CAD (cath 04/2012 and 04/2013 s/p PCI & DES to mRCA and dRCA), ICM, HL, and HTN who presented to [facility] with on going episodic chest pain that was worse on teh day of admission. He was admitted for chestv pain, rule out for MI   Allergies:  No Known Allergies:   Thyroid:  13-Aug-15 04:03   Thyroid Stimulating Hormone 3.29 (0.45-4.50 (International Unit)  ----------------------- Pregnant patients have  different reference  ranges for TSH:  - - - - - - - - - -  Pregnant, first trimetser:  0.36 - 2.50 uIU/mL)  Routine Chem:  13-Aug-15 04:03   Glucose, Serum 96  BUN 17  Creatinine (comp)  1.36  Sodium, Serum 143  Potassium, Serum 4.1  Chloride, Serum 107  CO2, Serum 28  Calcium (Total), Serum 8.8  Anion Gap 8  Osmolality (calc) 286  eGFR (African American) >60  eGFR (Non-African American)  52 (eGFR values <44mL/min/1.73 m2 may be an indication of chronic kidney disease (CKD). Calculated eGFR is useful in patients with stable renal function. The eGFR calculation will not be reliable in acutely ill patients when serum creatinine is changing rapidly. It is not useful in  patients on dialysis. The eGFR calculation may not be applicable to patients at the low and high extremes of body sizes, pregnant women, and vegetarians.)  Cholesterol, Serum 117  Triglycerides, Serum 78  HDL (INHOUSE) 44  VLDL Cholesterol Calculated 16  LDL Cholesterol Calculated 57 (Result(s) reported on 04 Sep 2013 at 05:19AM.)  Cardiac:  12-Aug-15 10:22   Troponin I < 0.02 (0.00-0.05 0.05 ng/mL or less: NEGATIVE  Repeat testing in 3-6 hrs  if clinically indicated. >0.05 ng/mL: POTENTIAL   MYOCARDIAL INJURY. Repeat  testing in 3-6 hrs if  clinically indicated. NOTE: An increase or decrease  of 30% or more on serial  testing suggests a  clinically important change)    15:38   Troponin I < 0.02 (0.00-0.05 0.05 ng/mL or less: NEGATIVE  Repeat testing in 3-6 hrs  if clinically indicated. >0.05 ng/mL: POTENTIAL  MYOCARDIAL INJURY. Repeat  testing in 3-6 hrs if  clinically indicated. NOTE: An increase or decrease  of 30% or more on serial  testing suggests a  clinically important change)  13-Aug-15 04:03   Troponin I < 0.02 (0.00-0.05 0.05 ng/mL or less: NEGATIVE  Repeat testing in 3-6 hrs  if clinically indicated. >0.05 ng/mL: POTENTIAL  MYOCARDIAL INJURY. Repeat  testing in 3-6 hrs if  clinically indicated. NOTE: An increase or decrease  of 30% or more on serial  testing suggests a  clinically important change)  Routine Hem:  13-Aug-15 04:03   WBC (CBC) 6.4  RBC (CBC) 4.82  Hemoglobin (CBC)  10.5  Hematocrit (CBC)  34.1  Platelet Count (CBC) 259  MCV  71  MCH  21.7  MCHC  30.7  RDW  20.4  Neutrophil % 51.6  Lymphocyte % 36.1  Monocyte % 9.4  Eosinophil % 2.2  Basophil % 0.7  Neutrophil # 3.3  Lymphocyte # 2.3  Monocyte # 0.6  Eosinophil # 0.1  Basophil # 0.0 (Result(s) reported on 04 Sep 2013 at 05:26AM.)   PERTINENT RADIOLOGY STUDIES: XRay:  12-Aug-15 10:42, Chest Portable Single View  Chest Portable Single View   REASON FOR EXAM:    Chest Pain  COMMENTS:       PROCEDURE: DXR - DXR PORTABLE CHEST SINGLE VIEW  - Sep 03 2013 10:42AM     CLINICAL DATA:  Chest pain    EXAM:  PORTABLE CHEST - 1 VIEW    COMPARISON:  05/09/2013    FINDINGS:  Apical lordotic positioning. Midline trachea. Borderline  cardiomegaly. Mediastinal contours otherwise within normal limits.  No pleural effusion or pneumothorax. No congestive failure. Clear  lungs.     IMPRESSION:  Borderline cardiomegaly, without acute disease.      Electronically  Signed    By: [name]    On: 09/03/2013 11:12         Verified By: [name], [name],   Pertinent Past History:  Pertinent Past History CAD, several stents placed to RCA in early 2015, known severe LCX diseasze (small vessel)   Hospital Course:  Hospital Course 1) Unstable angina/ICM:  Troponin negative x 3. No EKG changes  some chest pain on the left on 09/04/13. Given naproxen for possible musculoskeleatal chest pain with improvement of symptoms. --echo showing lateral wall hypokinesis, mildly depressed EF 45% --Ok for discharge, will continue on his current medications, add NTG sl for chest pain Follow up in clinic in 1 to 2 weeks  2) HTN:  Stable on metoprolol  3) Hyperlipidemia:  On simvastatin.   Condition on Discharge Fair   Code Status:  Code Status Full Code   DISCHARGE INSTRUCTIONS HOME MEDS:  Medication Reconciliation: Patient's Home Medications at Discharge:     Medication Instructions  simvastatin 40 mg oral tablet  1 tab(s) orally once a day (at bedtime)   losartan 50 mg oral tablet  1 tab(s) orally once a day   aspirin 81 mg oral delayed release tablet  1 tab(s) orally once a day   metoprolol succinate 50 mg oral tablet, extended release  1 tab(s) orally once a day   effient 10 mg oral tablet  1 tab(s) orally once a day     Physician's Instructions:  Home Health? No   Treatments None   Home Oxygen? No   Diet Regular   Activity Limitations As tolerated   Return to Work 1 week   Time frame for Follow Up Appointment 1-2 weeks  Scott [name], tim   Time frame for Follow Up Appointment 1-2 weeks  Scott [name], Scott [name](Consultant): [facility], [address] [place], [place], [place] [address], [place]   Lynett Fish B(Family Physician): [facility], [address], [place], [place] [address], [place] [phone]  Electronic Signatures: [name] (MD)  (Signed 13-Aug-15  13:44)  Authored: ADMISSION DATE AND DIAGNOSIS, CHIEF COMPLAINT/HPI, Allergies, PERTINENT LABS, PERTINENT RADIOLOGY STUDIES, PERTINENT PAST HISTORY, HOSPITAL COURSE, [place], PATIENT INSTRUCTIONS, Follow Up Physician   Last Updated: 13-Aug-15 13:44 by [name] (MD)

## 2014-05-16 NOTE — Consult Note (Signed)
General Aspect 74 y/o M with h/o obstructive CAD (cath 04/2012 and 04/2013 s/p PCI & DES to mRCA and dRCA), ICM, HL, and HTN who presented to [facility] today with on going episodic chest pain that was worse today since this morning.  ___________________________   Present Illness 74 y/o M with the above problem list presents to [facility] with comoplaint of worsening chest pain since this morning.   He has a significant cardiac history undergoing heart cath April 2014 Ostial left main 20 % stenosis. Proximal LAD 50 % stenosis, Mid LAD: 40 % stenosis, Proximal circumflex100 % stenosis. 2nd obtuse marginal the distal vessel was supplied by collaterals from the proximal LAD.There was a 100 % stenosis. Mid RCA: diffuse 95 % stenosis. Distal RCA: diffuse 80 % stenosis. Right PDA:The distal vessel was supplied by moderate collaterals from the distal LAD.  Recommendations were medical therapy, vs CABG vs ICD (no significant targets for CABG.) EF at that time was 35%. Echo showing EF 40-45%. He continued to have episodic chest pain and SOB and underwent a 2nd cardiac cath on 05/16/13 revealing: pLAD discrete 50%, D1 40%, pLCX distrete 80%, OM2 diffuse 99%, pRCA diffuse 70%, mRCA diffuse 80%, dRCA 95%. Interventions included DES mRCA and dRCA both resulting in 0% residual stenosis. He was continued on effient 10 mg daily, aspirin 81 mg daily, cozaar 50 mg daily, and simvastatin 40 mg daily. He also has recently had bilateral carotid dopplers performed indicating 40-50% stenosis bilaterally.  Today he comes in stating shortly after finshing up at cardiac rehab while he was in the cafeteria getting a milk he developed sharp subternal chest pain. Pain did not radiate and last approx 15-20 minutes before self resolving. He notified workers and they brought him to the ED for evaluation. No associated diaphoresis, nausea, vomiting, SOB, palpitations, cough, presyncope, syncope, or edema. Work up thus far includes troponin of <0.02 x 2,  CKMB 0.8-->0.9, CK 144-->116, EKG NSR, 73, PACs, no st/t changes, hgb 10.4, no bmet.  He is currently pain free.   Of note, since his cath on 05/16/13 he has been quite active, works outside a lot moving hay around and is asymptomatic. This is his first episode of chest pain since his cath.   Physical Exam:  GEN well developed, well nourished, no acute distress   HEENT hearing intact to voice, moist oral mucosa   NECK supple   RESP normal resp effort  clear BS   CARD Regular rate and rhythm  Normal, S1, S2  No murmur   ABD denies tenderness  soft  normal BS   EXTR negative edema   SKIN normal to palpation   NEURO cranial nerves intact   PSYCH alert, A+O to time, place, person, good insight   Review of Systems:  Subjective/Chief Complaint Chest pain   General: No Complaints   Skin: No Complaints   ENT: No Complaints   Eyes: No Complaints   Neck: No Complaints   Respiratory: No Complaints   Cardiovascular: Chest pain or discomfort   Gastrointestinal: No Complaints   Genitourinary: No Complaints   Vascular: No Complaints   Musculoskeletal: No Complaints   Neurologic: No Complaints   Hematologic: No Complaints   Endocrine: No Complaints   Psychiatric: No Complaints   Review of Systems: All other systems were reviewed and found to be negative   Medications/Allergies Reviewed Medications/Allergies reviewed   Family & Social History:  Family and Social History:  Family History Coronary Artery Disease  Father  with MI at 50   Social History negative ETOH, negative Illicit drugs   + Tobacco Prior (greater than 1 year)  previous smoker of 1 ppd, quit many years ago   Place of Living Home      MI - Myocardial Infarct:    Anemia:    Hypercholesterolemia:    Hypercholesterolemia:    Cardiac Cath:    Right Shoulder Surgery:    Tonsillectomy and Adenoidectomy:    Carpal Tunnel Release: Right   EGD/Colonoscopy: Aug 2012         Admit  Diagnosis:   UNSTABLE ANGINA: Onset Date: 03-Sep-2013, Status: Active, Description: UNSTABLE ANGINA  Home Medications: Medication Instructions Status  metoprolol succinate 50 mg oral tablet, extended release 1 tab(s) orally once a day Active  Effient 10 mg oral tablet 1 tab(s) orally once a day Active  simvastatin 40 mg oral tablet 1 tab(s) orally once a day (at bedtime) Active  losartan 50 mg oral tablet 1 tab(s) orally once a day Active  aspirin 81 mg oral delayed release tablet 1 tab(s) orally once a day Active   Lab Results:  Cardiac:  12-Aug-15 10:22   Troponin I < 0.02 (0.00-0.05 0.05 ng/mL or less: NEGATIVE  Repeat testing in 3-6 hrs  if clinically indicated. >0.05 ng/mL: POTENTIAL  MYOCARDIAL INJURY. Repeat  testing in 3-6 hrs if  clinically indicated. NOTE: An increase or decrease  of 30% or more on serial  testing suggests a  clinically important change)  CK, Total 144 (39-308 NOTE: NEW REFERENCE RANGE  02/24/2013)  CPK-MB, Serum 0.8 (Result(s) reported on 03 Sep 2013 at 10:52AM.)    15:38   Troponin I < 0.02 (0.00-0.05 0.05 ng/mL or less: NEGATIVE  Repeat testing in 3-6 hrs  if clinically indicated. >0.05 ng/mL: POTENTIAL  MYOCARDIAL INJURY. Repeat  testing in 3-6 hrs if  clinically indicated. NOTE: An increase or decrease  of 30% or more on serial  testing suggests a  clinically important change)  CK, Total 116 (39-308 NOTE: NEW REFERENCE RANGE  02/24/2013)  CPK-MB, Serum 0.9 (Result(s) reported on 03 Sep 2013 at 04:23PM.)  Routine Coag:  12-Aug-15 10:22   Prothrombin 13.7  INR 1.1 (INR reference interval applies to patients on anticoagulant therapy. A single INR therapeutic range for coumarins is not optimal for all indications; however, the suggested range for most indications is 2.0 - 3.0. Exceptions to the INR Reference Range may include: Prosthetic heart valves, acute myocardial infarction, prevention of myocardial infarction, and combinations  of aspirin and anticoagulant. The need for a higher or lower target INR must be assessed individually. Reference: The Pharmacology and Management of the Vitamin K  antagonists: the seventh ACCP Conference on Antithrombotic and Thrombolytic Therapy. UKGUR.4270 Sept:126 (3suppl): [id number]. A HCT value >55% may artifactually increase the PT.  In one study,  the increase was an average of 25%. Reference:  "Effect on Routine and Special Coagulation Testing Values of Citrate Anticoagulant Adjustment in Patients with High HCT Values." American Journal of Clinical Pathology 2006;126:400-405.)  Activated PTT (APTT) 30.0 (A HCT value >55% may artifactually increase the APTT. In one study, the increase was an average of 19%. Reference: "Effect on Routine and Special Coagulation Testing Values of Citrate Anticoagulant Adjustment in Patients with High HCT Values." American Journal of Clinical Pathology 2006;126:400-405.)  Routine Hem:  12-Aug-15 10:22   WBC (CBC) 4.1  RBC (CBC) 4.82  Hemoglobin (CBC)  10.4  Hematocrit (CBC)  34.0  Platelet Count (CBC) 273 (Result(s) reported  on 03 Sep 2013 at 10:45AM.)  MCV  71  MCH  21.5  MCHC  30.5  RDW  20.1   EKG:  EKG Interp. by me   Interpretation EKG shows NSR, 73, PACs, no st/t changes   Radiology Results: XRay:    12-Aug-15 10:42, Chest Portable Single View  Chest Portable Single View   REASON FOR EXAM:    Chest Pain  COMMENTS:       PROCEDURE: DXR - DXR PORTABLE CHEST SINGLE VIEW  - Sep 03 2013 10:42AM     CLINICAL DATA:  Chest pain    EXAM:  PORTABLE CHEST - 1 VIEW    COMPARISON:  05/09/2013    FINDINGS:  Apical lordotic positioning. Midline trachea. Borderline  cardiomegaly. Mediastinal contours otherwise within normal limits.  No pleural effusion or pneumothorax. No congestive failure. Clear  lungs.     IMPRESSION:  Borderline cardiomegaly, without acute disease.      Electronically Signed    By: Abigail Miyamoto M.D.    On:  09/03/2013 11:12         Verified By: [name], M.D.,    No Known Allergies:   Vital Signs/Nurse's Notes: **Vital Signs.:   12-Aug-15 14:10  Vital Signs Type Admission  Temperature Temperature (F) 97.7  Celsius 36.5  Temperature Source oral  Pulse Pulse 64  Respirations Respirations 19  Systolic BP Systolic BP 449  Diastolic BP (mmHg) Diastolic BP (mmHg) 66  Mean BP 98  Pulse Ox % Pulse Ox % 99  Pulse Ox Activity Level  At rest  Oxygen Delivery Room Air/ 21 %    Impression 74 y/o M with h/o obstructive CAD (cath 04/2012 and 04/2013 s/p PCI & DES to mRCA and dRCA), ICM, HL, and HTN who presented to [facility] today with on going episodic chest pain that was worse today since this morning.   1) Unstable angina/ICM:   s/p cardiac cath 04/2013 PCI and DES to the [facility] &dRCA. At that time he did have 50% stenosis along the pLAD and a discrete 80% stenosis along a small to medium sized LCx. It was felt at that time there were no targets for CABG, intervention along the RCA would benefit him the most as the LCx was a smaller vessel, and LAD grafting was not needed. Troponin negative x 2 to date. No EKG changes making ISR unlikely. He is on Effient and aspirin. --Will check echo to evaluate EF and wall motion.  continue rule out on heparin gtt.  Continue Effient at this time. Continue aspirin, metoprolol, and simvastatin.  Will discuss plan with him in AM  2) HTN:  On metroprolol. Monitor.   3) Hyperlipidemia:  On simvastatin. FLP pending.   Electronic Signatures: Rise Mu (PA-C)  (Signed 12-Aug-15 17:08)  Authored: General Aspect/Present Illness, History and Physical Exam, Review of System, Family & Social History, Past Medical History, Health Issues, Home Medications, Labs, EKG , Radiology, Allergies, Vital Signs/Nurse's Notes, Impression/Plan [name] (MD)  (Signed 12-Aug-15 19:53)  Authored: General Aspect/Present Illness, History and Physical Exam, Review of System,  Family & Social History, Health Issues, Labs, EKG , Impression/Plan  Co-Signer: General Aspect/Present Illness, Home Medications, Allergies   Last Updated: 12-Aug-15 19:53 by [name] (MD)

## 2014-05-22 ENCOUNTER — Telehealth: Payer: Self-pay | Admitting: *Deleted

## 2014-05-22 NOTE — Telephone Encounter (Signed)
Pt c/o of Chest Pain: STAT if CP now or developed within 24 hours  1. Are you having CP right now? No  2. Are you experiencing any other symptoms (ex. SOB, nausea, vomiting, sweating)? No  3. How long have you been experiencing CP? Every since he had stent put in first of April  4. Is your CP continuous or coming and going? Coming and going  5. Have you taken Nitroglycerin? Yes last night about 8pm ?

## 2014-05-22 NOTE — Telephone Encounter (Signed)
Spoke w/ pt.  He reports chest pain in the center of his chest, describes it as a solid pain, not like previously when he had stents placed. Reports that he took 4 nitro yesterday, but they were spaced out throughout the day, thinks they were about an hour between pills.  States that pain eased off later in the day, unsure if r/t 3rd nitro or not.  Discussed nitro and that it is a short acting med and he would have immediate relief, not later in the day.  Reports continued SOBOE, but states this has been chronic.  Pt denies n/v or diaphoresis.  He states that he is concerned, as the pain is in the center of his chest directly below his sternum. Pt states that he went to the ED several times before his stent was placed, and as he was told that there was nothing wrong w/ his heart, he is hesitant to go to the ED. After some discussion, he is agreeable to calling 911 if sx become emergent.  He asks that I make Dr. Rockey Situ aware and let him know of any recommendations.

## 2014-05-22 NOTE — Telephone Encounter (Signed)
Left message on pt's vm w/ Dr. Donivan Scull recommendation.  Asked him to call back on Monday to sched an EKG if sx persist.

## 2014-05-22 NOTE — Telephone Encounter (Signed)
Can come in for EKG on Monday If having more pain over the weekend, Could consider going to the ER.  Would hope the recent stent is fine and no progression of his disease. OK to take NTG Call back on Monday if having more CP.

## 2014-05-24 NOTE — Discharge Summary (Signed)
Dates of Admission and Diagnosis:  Date of Admission 13-Apr-2014   Date of Discharge 14-Apr-2014   Admitting Diagnosis Unstable angina   Final Diagnosis CAD s/p DES to pLAD 04/13/2014 and stenting x 3 to RCA in 04/2013   Discharge Diagnosis 1 HTN   2 HLD   3 History of Tobacco abuse    Chief Complaint/History of Present Illness 74 year old gentleman with history of CAD, cardiac catheterization April 2014 showing severe distal RCA disease, with left to right collaterals, subtotally occluded proximal left circumflex, 50% proximal LAD, 40% mid LAD disease, ejection fraction at that time 35% (medical management recommeneded at that time). Echocardiogram showing ejection fraction 40-45%. He underwent repeat cardiac cath in April 2015 that showed pLAD 50%, pLCx 80%, RCA was supplied by collaterals from the distal LCx. pRCA 70% s/p DES, mPCA 80% s/p DES, dRCA 95% s/p DES. Poor targets for CABG and LAD adequate/no intervention needed. EF 50%.  He also has history of ICM, remote history of tobacco abuse (smoking from age [age] - total of 40 years, stopping in 2011), HTN, HLD, and carotid arterial disease.   He was having atypical chest pain at nighttime status post cardiac cath in 2014, sometimes when he rolled around in bed. 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 Denied having any anginal type pain in the daytime when he was active, only sharp atypical type chest pain at nighttime. He was also reporting having significant pain in his left shoulder though has full range of motion. In follow up November 2015 he was still noted to have nighttime symptoms, however no exertional symptoms. No further ischemic work up.  He was hospitalized at [facility] in February 2016 with right-sided chest pain. Pain was not related to exertion, though he was getting increasingly SOB with exertion. Echo in February 2016  showed EF 82-99%, diastolic dusfunction. He was continued on his antiplatelet therapy and discharged home. He underwent nuclear stress testing on 04/08/2014 that showed a large region of moderate ischemia in the lateral, inferolateral, anterolateral, anterior, apical, and inferior territory. There was motion artifact noted. There was wall motion abnormality noted. The inferior-inferolateral wall was   Chief Complaint/History of Present Illness cont'd esstially AK to severely HK - c/w prior infarction or scar. EF 52%. No EKG changes concerning for ischemia. Overall this was a high risk scan. He underwent echo on 04/07/2014 that showed EF of 55-60%, GR1DD, trivial MR/TR. He was scheduled for another cardiac cath based on the abnormal nuclear stress test above. He underwent this on 04/13/14.   Allergies:  No Known Allergies:     Routine Chem:  22-Mar-16 02:35   Glucose, Serum 89 (65-99 NOTE: New Reference Range  03/17/14)  BUN 18 (6-20 NOTE: New Reference Range  03/17/14)  Creatinine (comp) 1.11 (0.61-1.24 NOTE: New Reference Range  03/17/14)  Sodium, Serum 140 (135-145 NOTE: New Reference Range  03/17/14)  Potassium, Serum 4.6 (3.5-5.1 NOTE: New Reference Range  03/17/14)  Chloride, Serum 107 (101-111 NOTE: New Reference Range  03/17/14)  CO2, Serum 29 (22-32 NOTE: New Reference Range  03/17/14)  Calcium (Total), Serum  8.5 (8.9-10.3 NOTE: New Reference Range  03/17/14)  Anion Gap  4  eGFR (African American) >60  eGFR (Non-African American) >60 (eGFR values <54mL/min/1.73 m2 may be an indication of chronic kidney disease (CKD). Calculated eGFR is useful in patients with stable renal function. The eGFR  calculation will not be reliable in acutely ill patients when serum creatinine is changing rapidly. It is not useful in patients on dialysis. The eGFR calculation may not be applicable to patients at the low and high extremes of body sizes, pregnant women, and vegetarians.)   Cardiac:  22-Mar-16 02:35   CPK-MB, Serum 2.3 (0.5-5.0 NOTE: New Reference Range  03/17/14)   Pertinent Past History:  Pertinent Past History See above   Hospital Course:  Hospital Course Outpatient cardiac cath on 04/13/2014 showed Moderate to severe proximal LAD disease estimated at 80% s/p DES,mild ISR of the proximal RCA, otherwise RCA stents patent, occluded proximal LCX, moderate ostial diagonal disease (#1), collateral from left to OM2. Mild to moderate ostial/proximal left main disease (FFR found to be 0.89 and not significant). He tolerated the procedure well. He was advised to continue Effient 10 mg daily and aspirin 81 mg daily. He was bradycardic into the 40s a couple of times overnight thus his Toprol was decreased from 50 mg daily to 25 mg daily. He was asymptomatic during these episodes (he was awake during some of them). He did have one isolated episode of chest pain overnight that was resolved with SL NTG. EKG non-acute. No further chest pain. He has ambulated without issues. Cath site without bruits, hematoma, bleeding, or TTP.   Condition on Discharge Stable   Code Status:  Code Status Full Code   DISCHARGE INSTRUCTIONS HOME MEDS:  Medication Reconciliation: Patient's Home Medications at Discharge:     Medication Instructions  simvastatin 40 mg oral tablet  1 tab(s) orally once a day (at bedtime)   losartan 50 mg oral tablet  1 tab(s) orally once a day   aspirin 81 mg oral delayed release tablet  1 tab(s) orally once a day   nitroglycerin 0.4 mg sublingual tablet  1 tab(s) sublingual every 5 minutes, As Needed - for Chest Pain   effient 10 mg oral tablet  1 tab(s) orally once a day   ferrous sulfate 325 mg oral tablet  1 tab(s) orally once a day   metoprolol succinate 25 mg oral tablet, extended release  1 tab(s) orally once a day    PRESCRIPTIONS: ELECTRONICALLY SUBMITTED   Physician's Instructions:  Dressing Care Remove dressing tomorrow.  May shower.    Diet Low Fat, Low Cholesterol   Activity Limitations No exertional activity  No heavy lifting  for 7 days   Return to Work 1 week   Time frame for Follow Up Appointment 1-2 weeks   Time frame for Follow Up Appointment 1-2 weeks     Rockey Situ, Timothy(Admitting Physician): [facility], [address] [place], [place], [place] [address], [place]  TIME SPENT:  Total Time: Greater than 30 minutes   Electronic Signatures: Rise Mu (PA-C)  (Signed 22-Mar-16 12:27)  Authored: ADMISSION DATE AND DIAGNOSIS, CHIEF COMPLAINT/HPI, Allergies, PERTINENT LABS, PERTINENT RADIOLOGY STUDIES, PERTINENT PAST HISTORY, HOSPITAL COURSE, DISCHARGE INSTRUCTIONS HOME MEDS, PATIENT INSTRUCTIONS, Follow Up Physician, TIME SPENT [name] (MD)  (Signed 22-Mar-16 12:40)  Authored: CHIEF COMPLAINT/HPI  Co-Signer: ADMISSION DATE AND DIAGNOSIS, CHIEF COMPLAINT/HPI, Allergies, PERTINENT LABS, PERTINENT RADIOLOGY STUDIES, PERTINENT PAST HISTORY, HOSPITAL COURSE, DISCHARGE INSTRUCTIONS HOME MEDS, PATIENT INSTRUCTIONS, Follow Up Physician, TIME SPENT   Last Updated: 22-Mar-16 12:40 by [name] (MD)

## 2014-05-24 NOTE — Consult Note (Signed)
Cardiology ConsultCardiologist: Dr. Rockey Situ yo with history of CAD, ischemic cardiomyopathy, HTN who came to the ER yesterday with chest pain.  Patient had DES x 3 to the RCA in 4/15.  He also had a subtotally occluded LCx that was not intervened upon.  He has had chest pain on and off chronically.  He was admitted overnight in February of this year with chest pain and sent home the next day, thought to have costochondritis.  On Thursday night, he had chest pain on and off.  The pain is migratory, sometimes on left, sometimes centrally, and sometimes on the right.  Sometimes it is throbbing, sometimes it is just constant.  Pain tends to last for 30 minutes to a few hours.  He had pain again on and off Friday night. On Saturday, he had pain on and off all day so came to the ER.  He was admitted.  ECG non-acute, cardiac enzymes negative x 3.  The chest pain is not exertional and not related to eating.  He says that it seems random.  baseline, he has dyspnea walking up hills.  He also gets lightheaded if he stands too fast.  meds:Zocor 40 daily8110XL 50 daily50 daily NSR, mild IVCD (124 msec) K 4, creatinine 1.27, troponin negative x 3, hemoglobin 9.7 CAD: 4/15 had DES x 3 to RCA.  He had a subtotally occluded LCx that was not intervened upon. Ischemic cardiomyopathy: Echo (3/14) with EF 40-45%. HTNHyperlipidemiaCarotid stenosis: 5/15 carotid dopplers with 40-59% bilateral ICA stenosis.  Married, lives in [place], prior smoker.  CAD All systems reviewed and negative except as per HPI.  131/70, HR 75, afebrileNADNo JVD, no thyromegaly/thyroid nodule.normalCTABHeart reg S1S2, no S3S4, no murmur, no carotid bruit, no peripheral edema, 1+ PT pulses bilaterally. Soft, NT, +BS, nondistendedno clubbing/cyanosis.A&O x 3. Point of tenderness lower central chest  74 yo with history of CAD, ischemic cardiomyopathy, HTN who came to the ER yesterday with chest pain. Chest pain: Atypical. Pain is migratory and not  associated with anything in particular.  He has a point of tenderness in the lower central chest but the pain is not always in that location.  ECG unremarkable and troponin negative x 3.  He does have known significant CAD with DES x 3 to RCA in 4/15 and residual subtotal LCx occlusion. Add Protonix 40 mg daily and Imdur 30 mg daily. Continue ASA, Effient, statin, Toprol XL, and losartan. This is his second admission in a month for chest pain.  Though it is somewhat atypical, I think that stress testing is warranted. I will arrange for ETT-Cardiolite, will have to be tomorrow morning. Ischemic cardiomyopathy: He is euvolemic on exam.  Last echo in 3/14 with EF 40-45%.  I will order repeat echo.    Electronic Signatures: Larey Dresser (MD)  (Signed on 13-Mar-16 10:54)  Authored  Last Updated: 13-Mar-16 10:54 by Larey Dresser (MD)

## 2014-05-24 NOTE — Discharge Summary (Signed)
PATIENT NAME:  Scott [name], Scott [name] MR#:  [id number] DATE OF BIRTH:  20-Nov-1940  DATE OF ADMISSION:  04/04/2014 DATE OF DISCHARGE:  04/05/2014  For a detailed note, please take a look at the history and physical done on admission by Dr. Fritzi Mandes.   DIAGNOSES AT DISCHARGE: As follows:  1.  Chest pain, likely atypical, noncardiac in nature.  2.  History of coronary artery disease.  3.  Hypertension.  4.  Hyperlipidemia.   DIET: The patient is being discharged on a low-sodium, low-fat diet.   ACTIVITY: As tolerated.   FOLLOWUP: With his primary care physician, Dr. Lisette Grinder, in the next 1 to 2 days. Also, follow up Dr. Loralie Champagne from cardiology in the next 1 to 2 weeks.   DISCHARGE MEDICATIONS: Simvastatin 40 mg at bedtime, losartan 50 mg daily, aspirin 81 mg daily, sublingual nitroglycerin as needed, Toprol 50 mg daily, Effient 10 mg daily, iron sulfate 325 mg daily, Protonix 40 mg daily, Imdur 30 mg daily.   [place] COURSE: Dr. Loralie Champagne from cardiology.   PERTINENT STUDIES DONE DURING THE HOSPITAL COURSE: A chest x-ray done on admission showing no acute abnormalities.   BRIEF HOSPITAL COURSE: This is a 74 year old male with medical problems as mentioned above who presented to the hospital with chest pain.   1.  Chest pain. This patient was very atypical for cardiac chest pain. The patient's pain was reproducible; therefore, there was some concern this could possibly be costochondritis. The patient does have risk factors given his previous history of coronary artery disease. He was therefore observed on telemetry, had 3 sets of cardiac markers checked which were negative. The patient was seen by cardiology with Dr. Loralie Champagne who did think that the patient needed a stress test but it could be done as an outpatient. Since the patient is clinically doing well, he is being discharged home on oral long-acting nitrates with Imdur and also Protonix for treatment  for underlying possible GI source for the chest pain and an outpatient stress test. He will continue his other home maintenance medications.  2.  History of coronary artery disease. The patient will continue his aspirin, Effient, beta blocker, and statin. He will have a Myoview done as an outpatient through his cardiologist's office. 3.  Hypertension. The patient remained hemodynamically stable. He will continue his Toprol and losartan.  4.  Hyperlipidemia. The patient was maintained on simvastatin. He will resume that.   CODE STATUS: The patient is a FULL CODE.   TIME SPENT ON DISCHARGE: 35 minutes.    ____________________________ Belia Heman. Verdell Carmine, MD vjs:ts D: 04/05/2014 15:15:48 ET T: 04/05/2014 23:43:19 ET JOB#: [id number]  cc: Belia Heman. Verdell Carmine, MD, <Dictator> John B. Sarina Ser, MD Larey Dresser, MD Henreitta Leber MD ELECTRONICALLY SIGNED 04/06/2014 14:40

## 2014-05-24 NOTE — H&P (Signed)
PATIENT NAME:  Scott [name], Scott [name] MR#:  [id number] DATE OF BIRTH:  May 01, 1940  DATE OF ADMISSION:  03/01/2014  REFERRING PHYSICIAN: Lavonia Drafts, MD.   FAMILY PHYSICIAN: Hewitt Blade. Gilford Rile, MD.  REASON FOR ADMISSION: Atypical chest pain.   HISTORY OF PRESENT ILLNESS: The patient is a 74 year old male with a history of coronary artery disease, status post MI with PTCA and stent placement in the right coronary artery. Has known 100% occlusion of the left circumflex artery and other insignificant disease. Followed by Dr. Rockey Situ of cardiology. Presents to the emergency room with sharp, shooting, right-sided chest pain that is worse with palpation. Symptoms occur at rest and are not exertionally related. No nausea, vomiting, diaphoresis, palpitations, or shortness of breath. In the emergency room, the pain was reproducible with palpation. His EKG showed no acute changes. His enzymes x 2 were negative. Because of his risk factors, the emergency room physician felt that the patient should be admitted and refused to discharge the patient from the emergency room. He is now admitted for further evaluation.   PAST MEDICAL HISTORY: 1. ASCVD, status post MI.  2. PTCA status post stent placement of the right coronary artery.  3. A 100% occlusion of the left circumflex artery.  4. Hyperlipidemia.  5. Benign hypertension.  6. Chronic anemia.  7. Status post right shoulder surgery.  8. Status post carpal tunnel surgery.   MEDICATIONS: 1. Zocor 40 mg p.o. at bedtime.  2. Nitrostat 0.4 mg sublingually p.r.n. chest pain.  3. Toprol-XL 50 mg p.o. daily.  4. Losartan 50 mg p.o. daily.  5. Effient 10 mg p.o. daily.  6. Aspirin 81 mg p.o. daily.   ALLERGIES: No known drug allergies.   SOCIAL HISTORY: Negative for alcohol or tobacco abuse.   FAMILY HISTORY: Positive for hypertension, coronary artery disease, stroke, but negative for colon or prostate cancer.   REVIEW OF SYSTEMS:  CONSTITUTIONAL: No fever or  change in weight.   EYES: No blurred or double vision. No glaucoma.   ENT: No tinnitus or hearing loss. No nasal discharge or bleeding. No difficulty swallowing.   RESPIRATORY: No cough or wheezing. Denies hemoptysis. No painful respiration.   CARDIOVASCULAR: No orthopnea or palpitations. No syncope.   GASTROINTESTINAL: No nausea, vomiting or diarrhea. No abdominal pain. No change in bowel habits.   GENITOURINARY: No dysuria or hematuria. No incontinence.   ENDOCRINE: No polyuria or polydipsia. No heat or cold intolerance.   HEMATOLOGIC: The patient denies anemia, easy bruising or bleeding.   LYMPHATIC: No swollen glands.   MUSCULOSKELETAL: The patient denies pain in his neck, back, shoulders, knees or hips. No gout.   NEUROLOGIC: No numbness or migraines. Denies stroke or seizures.   PSYCHOLOGICAL: The patient denies anxiety, insomnia or depression.   PHYSICAL EXAMINATION: GENERAL: The patient is extremely anxious in no acute distress.   VITAL SIGNS: Remarkable for a blood pressure of 143/80, heart rate 70 respiratory rate of 18, temperature of 97.9, saturation 99% on room air.   HEENT: Normocephalic, atraumatic. Pupils equally round and reactive to light and accommodation. Extraocular movements are intact. Sclerae are anicteric. Conjunctivae are clear. Oropharynx is clear.   NECK: Supple without JVD. No adenopathy or thyromegaly is noted.   LUNGS: Clear to auscultation and percussion without wheezes, rales or rhonchi. No dullness. Respiratory effort is normal.   CARDIAC: Reveals regular rate and rhythm with a normal S1 and S2. No significant rubs, murmurs or gallops. PMI is nondisplaced. Chest wall is nontender.  ABDOMEN: Soft, nontender, with normoactive bowel sounds. No organomegaly or masses were appreciated. No hernias or bruits were noted.   EXTREMITIES: Without clubbing, cyanosis, edema. Pulses were 2+ bilaterally.   SKIN: Warm and dry without rash or lesions.    NEUROLOGIC: Cranial nerves 2 through 12 grossly intact. Deep tendon reflexes were symmetric. Motor and sensory exam is nonfocal.   PSYCHIATRIC: Revealed a patient who is alert and oriented to person, place and time. He was cooperative and used good judgment.   LABORATORY DATA: EKG revealed sinus rhythm with no acute ischemic changes. Chest x-ray was unremarkable. Troponins x 2 were less than 0.02. His hemoglobin was 10.3. Glucose 93 with a BUN of 17 and creatinine 1.23.   ASSESSMENT: 1. Atypical chest pain.  2. Atherosclerotic cardiovascular disease.  3. Chronic anemia.  4. Anxiety.  5. Benign hypertension.   PLAN: The patient will be observed on telemetry. We will follow serial cardiac enzymes and obtain an echocardiogram. Will consult cardiology for his atypical chest pain. Continue his outpatient regimen for now. Further treatment and evaluation will depend upon the patient's progress.   TOTAL TIME SPENT ON THIS PATIENT: 50 minutes.     ____________________________ Leonie Douglas Doy Hutching, MD jds:TT D: 03/01/2014 15:53:19 ET T: 03/01/2014 16:30:29 ET JOB#: [id number]  cc: Leonie Douglas. Doy Hutching, MD, <Dictator> John B. Sarina Ser, MD  Mckinna Demars Lennice Sites MD ELECTRONICALLY SIGNED 03/02/2014 21:10

## 2014-05-24 NOTE — Consult Note (Signed)
General Aspect Primary Cardiologist: Dr. Rockey Situ, MD _________________ 74 year old male with history of CAD s/p stenting 04/2013, ICM, remote history of tobacco abuse, HTN, HLD, and carotid arterial disease who presented to [facility] on 2/7 with non-exertional chest pain, troponin negative x 3, EKG non-acute. Cardiology consulted for further evaluation.  He did smoke from age [age], total of 40 years. Stopped smoking many years ago.   _________________  PMH:  1. CAD s/p cath 03/2012 with 3 vessel disease (medical Rx), cath 04/2013 s/p DES x 3 to RCA  2. ICM  3. Remote tobacco abuse  4. HTN  5. HLD  6. Carotid arterial disease __________________   Present Illness 74 year old male with the above problem list who presented to [facility] on 2/7 with non-exertional chest pain that was found to be right-sided and sharp in nature.  He has known CAD s/p cardiac cath in 03/2012 that showed severe distal RCA disease, with left to right collaterals, subtotally occluded proximal left circumflex, 50% proximal LAD, 40% mid LAD disease, ejection fraction at that time 35%, echocardiogram showing ejection fraction 40-45%. Medical treatment was recommended at that time. He was having atypical chest pain at nighttime status post cardiac cath in 2014, sometimes when he rolled around in bed. He was very active in the daytime, active carpenter, does lots of jobs around the house. Denied having any anginal type pain in the daytime when he was active, only sharp atypical type chest pain at nighttime. He was also reporting having significant pain in his left shoulder though has full range of motion. He underwent repeat cardiac cath 04/2013 that showed Severe mid to dRCA disease, severe proximal LCx disease, left to right collaterals (from mid to distal LCx), mild to moderate pLAD disease. EF estimated at 50%, mild inferior wall HK (particularly in basal to mid region). RCA was the larger vessel (LCx smaller at 1.5 mm). Poor targets for  CABG and LAD adequate/no intervention needed. He underwent DES x 3 to p,m,dRCA. EF 50%. In follow up November 2015 he was still noted to have nighttime symptoms, however no exertional symptoms. No further ischemic work up.   He presented to [facility] on 2/7 with sharp, shooting, right-sided chest pain. He has been having intermittent chest pain and SOB for the past 3-4 weeks. Pain is not related to exertion - has been been bailing hay without any pain, though he does get more SOB with this activity. No nausea, vomiting, diaphoresis, palpitations, or shortness of breath. Some palpitations. He has also noted a frequent cough over night. In the emergency room, the pain was reproducible with palpation, which was c/w his last office visit with Dr. Rockey Situ back in November. His EKG showed no acute changes. His cardiac enzymes were negative x 3. CXR negative.  He was admitted for rule out.   Physical Exam:  GEN well developed, no acute distress   HEENT hearing intact to voice   NECK supple   RESP normal resp effort  clear BS   CARD Regular rate and rhythm  No murmur   ABD denies tenderness  soft   EXTR negative edema   SKIN normal to palpation   NEURO cranial nerves intact   PSYCH alert, A+O to time, place, person, good insight   Review of Systems:  Subjective/Chief Complaint chest thumping pain   General: No Complaints   Skin: No Complaints   ENT: No Complaints   Eyes: No Complaints   Neck: No Complaints   Respiratory: Frequent  cough  Short of breath   Cardiovascular: Chest pain or discomfort  Dyspnea   Gastrointestinal: No Complaints   Genitourinary: No Complaints   Vascular: No Complaints   Musculoskeletal: No Complaints   Neurologic: No Complaints   Hematologic: No Complaints   Endocrine: No Complaints   Psychiatric: No Complaints   Review of Systems: All other systems were reviewed and found to be negative   Medications/Allergies Reviewed Medications/Allergies  reviewed   Family & Social History:  Family and Social History:  Family History Coronary Artery Disease   Social History positive  tobacco, negative ETOH, negative Illicit drugs   + Tobacco Prior (greater than 1 year)  35 pack years   Place of Living Home  lives with wife       MI - Myocardial Infarct:    Anemia:    Hypercholesterolemia:    Hypercholesterolemia:    Cardiac Cath:    Right Shoulder Surgery:    Tonsillectomy and Adenoidectomy:    Carpal Tunnel Release: Right   EGD/Colonoscopy: Aug 2012  Home Medications: Medication Instructions Status  nitroglycerin 0.4 mg sublingual tablet 1 tab(s) sublingual every 5 minutes, As Needed - for Chest Pain Active  metoprolol succinate 50 mg oral tablet, extended release 1 tab(s) orally once a day Active  Effient 10 mg oral tablet 1 tab(s) orally once a day Active  simvastatin 40 mg oral tablet 1 tab(s) orally once a day (at bedtime) Active  losartan 50 mg oral tablet 1 tab(s) orally once a day Active  aspirin 81 mg oral delayed release tablet 1 tab(s) orally once a day Active   Lab Results:  Routine Chem:  08-Feb-16 04:59   Glucose, Serum 90  BUN 18  Creatinine (comp) 1.29  Sodium, Serum 141  Potassium, Serum 4.0  Chloride, Serum 106  CO2, Serum 27  Calcium (Total), Serum  8.2  Anion Gap 8  Osmolality (calc) 283  eGFR (African American) >60  eGFR (Non-African American)  58 (eGFR values <3mL/min/1.73 m2 may be an indication of chronic kidney disease (CKD). Calculated eGFR, using the MRDR Study equation, is useful in  patients with stable renal function. The eGFR calculation will not be reliable in acutely ill patients when serum creatinine is changing rapidly. It is not useful in patients on dialysis. The eGFR calculation may not be applicable to patients at the low and high extremes of body sizes, pregnant women, and vegetarians.)  Cardiac:  07-Feb-16 11:39   Troponin I < 0.02 (0.00-0.05 0.05 ng/mL or  less: NEGATIVE  Repeat testing in 3-6 hrs  if clinically indicated. >0.05 ng/mL: POTENTIAL  MYOCARDIAL INJURY. Repeat  testing in 3-6 hrs if  clinically indicated. NOTE: An increase or decrease  of 30% or more on serial  testing suggests a  clinically important change)    14:36   Troponin I < 0.02 (0.00-0.05 0.05 ng/mL or less: NEGATIVE  Repeat testing in 3-6 hrs  if clinically indicated. >0.05 ng/mL: POTENTIAL  MYOCARDIAL INJURY. Repeat  testing in 3-6 hrs if  clinically indicated. NOTE: An increase or decrease  of 30% or more on serial  testing suggests a  clinically important change)    18:39   Troponin I < 0.02 (0.00-0.05 0.05 ng/mL or less: NEGATIVE  Repeat testing in 3-6 hrs  if clinically indicated. >0.05 ng/mL: POTENTIAL  MYOCARDIAL INJURY. Repeat  testing in 3-6 hrs if  clinically indicated. NOTE: An increase or decrease  of 30% or more on serial  testing suggests a  clinically important change)  Routine Hem:  07-Feb-16 11:39   WBC (CBC) 5.6  RBC (CBC) 5.06  Hemoglobin (CBC)  10.3  Hematocrit (CBC)  34.0  Platelet Count (CBC) 304 (Result(s) reported on 01 Mar 2014 at 12:02PM.)  08-Feb-16 04:59   WBC (CBC) 5.4  RBC (CBC)  4.37  Hemoglobin (CBC)  8.9  Hematocrit (CBC)  29.0  Platelet Count (CBC) 264  MCV  66  MCH  20.3  MCHC  30.6  RDW  19.8  Neutrophil % 44.7  Lymphocyte % 41.0  Monocyte % 10.2  Eosinophil % 3.2  Basophil % 0.9  Neutrophil # 2.4  Lymphocyte # 2.2  Monocyte # 0.5  Eosinophil # 0.2  Basophil # 0.0 (Result(s) reported on 02 Mar 2014 at 06:01AM.)   EKG:  EKG Interp. by me   Interpretation EKG shows NSR, 75 bpm, baseline artifact, no significant st/t changes   Radiology Results:  XRay:    07-Feb-16 12:13, Chest Portable Single View  Chest Portable Single View   REASON FOR EXAM:    Chest pain  COMMENTS:       PROCEDURE: DXR - DXR PORTABLE CHEST SINGLE VIEW  - Mar 01 2014 12:13PM     CLINICAL DATA:  Right-sided chest  pain this morning.    EXAM:  PORTABLE CHEST - 1 VIEW    COMPARISON:  01/05/2014    FINDINGS:  The heart size and mediastinal contours are within normal limits.  Both lungs are clear. The visualized skeletal structures are  unremarkable.     IMPRESSION:  No active disease.      Electronically Signed    By: Marin Olp M.D.    On: 03/01/2014 12:41         Verified By: Pearletha Alfred, M.D.,  Cardiology:    08-Feb-16 08:50, Echo Doppler  Echo Doppler   REASON FOR EXAM:      COMMENTS:       PROCEDURE: [facility] - ECHO DOPPLER COMPLETE(TRANSTHOR)  - Mar 02 2014  8:50AM     RESULT: Echocardiogram Report    Patient Name:   Scott [name] Date of Exam: 03/02/2014  Medical Rec #:  [id number]         Custom1:  Date of Birth:  25-Feb-1940       Height:       70.1 in  Patient Age:    54 years       Weight:       169.8 lb  Patient Gender: M              BSA:          1.95 m??    Indications: Angina  Sonographer:    Sherrie Sport RDCS  Referring Phys: Doy Hutching, JEFFREY, D    Summary:   1. Left ventricular ejection fraction, by visual estimation, is 60 to   65%.   2. Normal global left ventricular systolic function.   3. Impaired relaxation pattern of LV diastolic filling.   4. Mild left ventricular hypertrophy.   5. Normal right ventricular size and systolic function.   6. Mildly dilated left atrium.   7. Mild tricuspid regurgitation.   8. Normal RVSP  2D AND M-MODE MEASUREMENTS (normal ranges within parentheses):  Left Ventricle:          Normal  IVSd (2D):      1.29cm (0.7-1.1)  LVPWd (2D):     1.46 cm (0.7-1.1) Aorta/LA:  Normal  LVIDd (2D):     4.91 cm (3.4-5.7) Aortic Root (2D): 2.90 cm (2.4-3.7)  LVIDs (2D):     3.59 cm           Left Atrium (2D): 4.30 cm (1.9-4.0)  LV FS (2D):     26.9 %   (>25%)  LV EF (2D):     52.3 %   (>50%)                                    Right Ventricle:                                    RVd (2D):        6.26 cm  LV SYSTOLIC FUNCTION BY  2D PLANIMETRY (MOD):  EF-A2C View: 94.8 %  LV DIASTOLIC FUNCTION:  MV PeakE: 0.65 m/s E/e' Ratio: 6.20  MV Peak A: 0.80 m/s Decel Time: 278 msec  E/A Ratio: 0.82  SPECTRAL DOPPLER ANALYSIS (where applicable):  Mitral Valve:  MV P1/2 Time: 80.62 msec  MV Area, PHT: 2.73 cm??  Aortic Valve: AoV Max Vel: 1.09 m/s AoV Peak PG: 4.8 mmHg AoV Mean PG:  LVOT Vmax: 0.73 m/s LVOT VTI:  LVOT Diameter: 2.00 cm  AoV Area, Vmax: 2.10 cm?? AoV Area, VTI:  AoV Area, Vmn:  Tricuspid Valve and PA/RV Systolic Pressure: TR Max Velocity: 2.12 m/s RA   Pressure: 5 mmHg RVSP/PASP: 23.1 mmHg  Pulmonic Valve:  PV Max Velocity: 1.01 m/s PV Max PG: 4.1 mmHg PV Mean PG:    PHYSICIAN INTERPRETATION:  Left Ventricle: The left ventricular internal cavity size was normal. LV   posterior wall thickness was normal. Mild left ventricular hypertrophy.   Global LV systolic function was normal. Left ventricular ejection   fraction, by visual estimation, is 60 to 65%. Spectral Doppler shows     impaired relaxation pattern of LV diastolic filling.  Right Ventricle: Normal right ventricular size, wall thickness, and   systolic function. The right ventricular size is normal. Global RV   systolic function is normal.  Left Atrium: The left atrium is mildly dilated.  Right Atrium: The right atrium is normal in size.  Pericardium: There is no evidence ofpericardial effusion.  Mitral Valve: The mitral valve is normal in structure.  Tricuspid Valve: The tricuspid valve is normal. Mild tricuspid   regurgitation is visualized. The tricuspid regurgitant velocity is 2.12   m/s, and with an assumed right atrial pressure of 5 mmHg, the estimated   right ventricular systolic pressure is normal at 23.1 mmHg.  Aortic Valve: The aortic valve is normal. The aortic valve is   structurally normal, with no evidence of sclerosis or stenosis. No   evidence of aortic valve regurgitation is seen.  Pulmonic Valve: The pulmonic valve is normal.  Trace pulmonic valve   regurgitation.  Aorta: The aortic root and ascending aorta are structurally normal, with   no evidence of dilitation.    [id number] [name] MD  Electronically signed by [id number] [name] MD  Signature Date/Time: 03/02/2014/9:29:19 AM    *** Final ***    IMPRESSION: .        Verified By: Minna Merritts, M.D., MD    No Known Allergies:   Vital Signs/Nurse's Notes: **Vital Signs.:   08-Feb-16 07:25  Vital Signs Type Q 4hr  Temperature Temperature (F) 97.8  Celsius 36.5  Temperature Source oral  Pulse Pulse 65  Respirations Respirations 18  Systolic BP Systolic BP 811  Diastolic BP (mmHg) Diastolic BP (mmHg) 75  Mean BP 96  Pulse Ox % Pulse Ox % 97  Pulse Ox Activity Level  At rest  Oxygen Delivery Room Air/ 21 %    Impression 74 year old male with history of CAD s/p stenting 04/2013, ICM, remote history of tobacco abuse, HTN, HLD, and carotid arterial disease who presented to [facility] on 2/7 with non-exertional chest pain, troponin negative x 3, EKG non-acute. Cardiology consulted for further evaluation.  He did smoke from age [age], total of 40 years. Stopped smoking many years ago.   1. Chest pain/SOB: resolved. 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 CAD s/p stenting x 3 to RCA in 04/2013: -Continue Effient and aspirin until at least 04/2014 -Continue Zocor 40 mg  3. HTN: -Labile while admitted, secondary to NTG his BP dropped -Continue current regimen -Continue Metoprolol 50 mg, losartan 50 mg   4. HLD: -Zocor as above  5. History of tobacco abuse stable, stopped years ago   Electronic Signatures: Rise Mu (PA-C)  (Signed 08-Feb-16 08:40)  Authored: General Aspect/Present Illness, History and Physical  Exam, Review of System, Family & Social History, Past Medical History, Home Medications, Labs, EKG , Radiology, Allergies, Vital Signs/Nurse's Notes, Impression/Plan [name] (MD)  (Signed 08-Feb-16 10:19)  Authored: General Aspect/Present Illness, History and Physical Exam, Review of System, Family & Social History, Past Medical History, EKG , Radiology, Vital Signs/Nurse's Notes, Impression/Plan  Co-Signer: General Aspect/Present Illness, History and Physical Exam, Review of System, Family & Social History, Past Medical History, Home Medications, Labs, EKG , Radiology, Allergies, Vital Signs/Nurse's Notes, Impression/Plan   Last Updated: 08-Feb-16 10:19 by [name] (MD)

## 2014-05-24 NOTE — H&P (Signed)
PATIENT NAME:  Scott [name], Scott [name] MR#:  [id number] DATE OF BIRTH:  04-01-1940  DATE OF ADMISSION:  04/04/2014  PRIMARY CARE PHYSICIAN: John B. Sarina Ser, MD, [facility]  CARDIOLOGIST: Minna Merritts, MD  CHIEF COMPLAINT: Right mid substernal chest pain.   HISTORY OF PRESENT ILLNESS: Scott [name] is a 74 year old Caucasian gentleman with a history of CAD, hypertension, hyperlipidemia, who was just recently admitted February 7, discharged February 8 with noncardiac chest pain, possible costochondritis, comes in again today with complaints of chest pain which has been on the right midsternal area which started last night, intensity of 10/10, continued on and off, came to the Emergency Room where he was ruled out for MI with 2 sets of  negative troponins. The patient received some aspirin and IV morphine. His chest pain came down to 3/10. ER physician spoke with Dr. Algernon Huxley and recommended if the patient continues to have chest pain to admit for overnight observation. The patient is agreeable for the same and is being admitted for unstable angina. He is otherwise hemodynamically stable.   PAST MEDICAL HISTORY:  1.  Coronary artery disease. Last cardiac catheterization was April 2015. He is status post drug-eluting stent x 3 to RCA in April 2015. He has overall 3-vessel disease.  2.  Ischemic cardiomyopathy. 3.  Remote tobacco abuse.  4.  Hypertension.  5.  Hyperlipidemia.  6.  Carotid artery disease.   FAMILY HISTORY: Positive for coronary artery disease.   SOCIAL HISTORY: Lives with wife. Negative for alcohol. Remote smoker.   ALLERGIES: No known drug allergies.   MEDICATIONS:  1.  Simvastatin 40 mg p.o. at bedtime.  2.  Nitroglycerin 0.4 mg sublingual as needed.  3.  Metoprolol 50 mg 1 p.o. daily.  4.  Losartan 50 mg daily.  5.  Ferrous sulfate 325 mg p.o. daily.   6.  Effient 10 mg p.o. daily.  7.  Aspirin 81 mg daily.   REVIEW OF SYSTEMS:   CONSTITUTIONAL: No fever, fatigue, or  weakness.  EYES: No blurred or double vision, glaucoma, or cataracts.  ENT: No tinnitus, ear pain, hearing loss, or snoring.  RESPIRATORY: No cough, wheeze, hemoptysis, or dyspnea.  CARDIOVASCULAR: Positive for chest pain. No orthopnea, edema, dyspnea on exertion, or palpitations.  GASTROINTESTINAL: No nausea, vomiting, diarrhea, abdominal pain, or GERD.   GENITOURINARY: No dysuria, hematuria, or frequency.  ENDOCRINE: No polyuria, nocturia, or thyroid problems.  HEMATOLOGY: No anemia, easy bruising, or bleeding.  SKIN: No acne, rash, or lesions.  MUSCULOSKELETAL: Positive for arthritis and back pain. No swelling or gout.  NEUROLOGIC: No CVA, TIA, seizures, or dementia.  PSYCHIATRIC:  No anxiety, depression, or bipolar disorder.   All other systems reviewed are negative.   PHYSICAL EXAMINATION:  GENERAL: The patient is awake, alert, oriented x 3, not in acute distress.  VITAL SIGNS: Afebrile, pulse is 66, blood pressure is 123/73, saturations are 99% on room air.  HEENT: Atraumatic, normocephalic. Pupils: PERRLA. EOMI intact. Oral mucosa is moist.  NECK: Supple. No JVD. No carotid bruit.  RESPIRATORY: Clear to auscultation bilaterally. No rales, rhonchi, respiratory distress, or labored breathing.  CARDIOVASCULAR: Both heart sounds are normal. Rate, rhythm regular. PMI not lateralized. Chest nontender.  EXTREMITIES: Good pedal pulses. Good femoral pulses. No lower extremity edema.  ABDOMEN: Soft, benign, nontender. No organomegaly. Positive bowel sounds.  NEUROLOGIC: Grossly intact cranial nerves II through XII. No motor or sensory deficits.  PSYCHIATRIC: The patient is awake, alert, oriented x 3.   EKG shows  normal sinus rhythm. No acute ST elevation or depression.   LABORATORY DATA: Troponin x 2 is less than 0.03. Chest x-ray: No acute abnormality. H and H is 9.7 and 33.4. Glucose is 133, BUN is 24, creatinine is 1.27. The rest of the electrolytes are within normal limits. UA negative  for UTI.   ASSESSMENT: A 74 year old, [name], with history of coronary artery disease, status post drug-eluting stent placement in April 2015, along with chronic multivessel disease, comes to the Emergency Room with:  1.  Chest pain, appears unstable angina. So far he has ruled out with 2 sets of cardiac enzymes which remain negative. No acute EKG changes. We will admit the patient to telemetry floor. Continue his aspirin, Effient, losartan, and Toprol. I will add long-acting Imdur. We will have cardiology see the patient in consultation. We will cycle cardiac enzymes x 3.  2.  Hyperlipidemia. Continue atorvastatin.  3.  Hypertension. Continue Toprol-XL and losartan.  4.  Chronic anemia. Hemoglobin is stable.  5.  Deep vein thrombosis prophylaxis. Subcutaneous heparin t.i.d.   The above was discussed with patient and patient's family.   TIME SPENT: 45 minutes.    ____________________________ Hart Rochester Posey Pronto, MD sap:at D: 04/04/2014 18:31:00 ET T: 04/04/2014 19:11:32 ET JOB#: [id number]  cc: Minna Merritts, MD Kaelan Emami A. Posey Pronto, MD, <Dictator> John B. Sarina Ser, MD      Ilda Basset MD ELECTRONICALLY SIGNED 04/27/2014 12:13

## 2014-05-25 ENCOUNTER — Ambulatory Visit (INDEPENDENT_AMBULATORY_CARE_PROVIDER_SITE_OTHER): Payer: Medicare Other

## 2014-05-25 ENCOUNTER — Emergency Department
Admission: EM | Admit: 2014-05-25 | Discharge: 2014-05-25 | Discharge status: Against Medical Advice | Payer: Medicare Other | Attending: Emergency Medicine | Admitting: Emergency Medicine

## 2014-05-25 ENCOUNTER — Other Ambulatory Visit: Payer: Self-pay

## 2014-05-25 VITALS — BP 124/74 | HR 59 | Wt 179.2 lb

## 2014-05-25 DIAGNOSIS — R079 Chest pain, unspecified: Secondary | ICD-10-CM | POA: Diagnosis not present

## 2014-05-25 DIAGNOSIS — I10 Essential (primary) hypertension: Secondary | ICD-10-CM | POA: Insufficient documentation

## 2014-05-25 LAB — BASIC METABOLIC PANEL
ANION GAP: 6 (ref 5–15)
BUN: 15 mg/dL (ref 6–20)
CO2: 29 mmol/L (ref 22–32)
Calcium: 9.1 mg/dL (ref 8.9–10.3)
Chloride: 105 mmol/L (ref 101–111)
Creatinine, Ser: 1.13 mg/dL (ref 0.61–1.24)
GFR calc Af Amer: >60 mL/min (ref >60)
GFR calc non Af Amer: >60 mL/min (ref >60)
GLUCOSE: 88 mg/dL (ref 65–99)
Potassium: 4.4 mmol/L (ref 3.5–5.1)
SODIUM: 140 mmol/L (ref 135–145)

## 2014-05-25 LAB — CBC
HEMATOCRIT: 40.3 % (ref 40.0–52.0)
Hemoglobin: 12.8 g/dL — ABNORMAL LOW (ref 13.0–18.0)
MCH: 24.1 pg — AB (ref 26.0–34.0)
MCHC: 31.7 g/dL — ABNORMAL LOW (ref 32.0–36.0)
MCV: 75.9 fL — AB (ref 80.0–100.0)
PLATELETS: 253 10*3/uL (ref 150–440)
RBC: 5.31 MIL/uL (ref 4.40–5.90)
RDW: 26.8 % — ABNORMAL HIGH (ref 11.5–14.5)
WBC: 4.8 10*3/uL (ref 3.8–10.6)

## 2014-05-25 LAB — TROPONIN I: Troponin I: <0.03 ng/mL (ref <0.031)

## 2014-05-25 NOTE — Progress Notes (Signed)
1.) Reason for visit: EKG  2.) Name of MD requesting visit: Dr. Rockey Situ  3.) H&P:   Pt reports chest pain in the center of his chest, describes it as a solid pain, not like previously when he had stents placed. Reports that he took 4 nitro on Thursday, but they were spaced out throughout the day, thinks they were about an hour between pills.  States that pain eased off later in the day, unsure if r/t 3rd nitro or not.  Reports continued SOBOE, but states this has been chronic.  Pt denies n/v or diaphoresis.  He states that he is concerned, as the pain is in the center of his chest directly below his sternum. Pt states that he went to the ED several times before his stent was placed, and as he was told that there was nothing wrong w/ his heart, he is hesitant to go to the ED.  4.) ROS related to problem: Pt reports active chest pain, 4/10 on pain scale.  He reports that he had chest pain all weekend.  Symptoms occurred while at rest or during exertion and not resolved by nitro.  During EKG, he is visibly uncomfortable, stating that he needs some kind of relief and cannot lie still.  Pt states that he did not go to the ED, as he does not want to wait and he is not sure of what they would be able to do for him.    5.) Assessment and plan per MD: Dr. Fletcher Anon reviewed pt's EKG and compared to previous EKGs.  He advises pt proceed to ED.  Spoke w/ Dr. Rockey Situ who advises the same.  Spoke w/ pt and his wife and advised them of MD recommendation.  They are hesitant.  Advised him that if they are not agreeable to this, I can give them orders for STAT troponin & cardiac enzymes, but he will need to wait for the results.  After some discussion, pt's wife states "you have to do something" and he is agreeable to proceeding to ED.  Offered to wheel pt over in wheelchair, but he declines, stating that he prefers to walk over.  Advised against this, but he insists on walking.

## 2014-05-25 NOTE — Patient Instructions (Signed)
Please proceed to the ED immediately.

## 2014-05-25 NOTE — ED Notes (Signed)
Pt c/o substernal chest pain that started on Thursday and is relieved with nitro SL but comes back, has been intermittent since thursday with dizziness/lightheadedness, SOB.Hx of MI with 4 stents placed.

## 2014-05-25 NOTE — ED Notes (Signed)
Pt reports that he has had chest pain for "awhile" was sent over by MD. Denies radiation, N/V or diaphoresis

## 2014-06-08 ENCOUNTER — Encounter: Payer: Medicare Other | Attending: Cardiology | Admitting: *Deleted

## 2014-06-08 VITALS — Ht 70.0 in | Wt 179.3 lb

## 2014-06-08 DIAGNOSIS — Z9861 Coronary angioplasty status: Secondary | ICD-10-CM

## 2014-06-08 DIAGNOSIS — Z955 Presence of coronary angioplasty implant and graft: Secondary | ICD-10-CM | POA: Diagnosis not present

## 2014-06-08 NOTE — Progress Notes (Signed)
Cardiac Individual Treatment Plan  Patient Details  Name: Scott [name] MRN: [id number] Date of Birth: [birth year] Referring Provider:  Mikle Bosworth,*  Initial Encounter Date: Date: 06/08/14  Patient's Home Medications on Admission:  Current outpatient prescriptions:  .  atorvastatin (LIPITOR) 80 MG tablet, Take by mouth., Disp: , Rfl:  .  isosorbide mononitrate (IMDUR) 30 MG 24 hr tablet, Take 30 mg by mouth 1 day or 1 dose., Disp: , Rfl:  .  metoprolol succinate (TOPROL-XL) 50 MG 24 hr tablet, Take by mouth., Disp: , Rfl:  .  aspirin EC 81 MG tablet, Take 1 tablet (81 mg total) by mouth daily., Disp: , Rfl:  .  losartan (COZAAR) 50 MG tablet, Take 50 mg by mouth daily., Disp: , Rfl:  .  nitroGLYCERIN (NITROSTAT) 0.4 MG SL tablet, Place 1 tablet (0.4 mg total) under the tongue every 5 (five) minutes as needed for chest pain., Disp: 90 tablet, Rfl: 3 .  prasugrel (EFFIENT) 10 MG TABS tablet, Take 1 tablet (10 mg total) by mouth daily., Disp: 30 tablet, Rfl: 11  Past Medical History: Past Medical History  Diagnosis Date  . CAD (coronary artery disease)     a. 04/2012 Cath: 3VD->Med Rx;  b. 04/2013 Cath/PCI: LAD 50p, D1 40, LCX 80p, OM2 99diff, RCA 70p, 6m 95d. Mid & distal RCA treated with 2 DES.  . Cardiomyopathy, ischemic     a. 04/2012 Echo: EF 40-45%;  b. 08/2013 Echo: EF 45-50%, mild glob HK, mod lat/post HK, diast dysfxn, mildly dil LA, mild MR, nl RVSP.  .Marland KitchenHyperlipidemia   . HTN (hypertension)   . Carotid arterial disease     a. 05/2013 Carotid U/S; bilat 40-50% ICA stenosis.    Tobacco Use: History  Smoking status  . Former Smoker -- 1.00 packs/day for 35 years  . Types: Cigarettes  . Start date: 05/02/2009  Smokeless tobacco  . Not on file    Labs: LDL 72, Trig 88, HDL 43, HBA1c 5.7 on May 26, 2014   Exercise Target Goals: Date: 06/08/14  Exercise Program Goal: Individual exercise prescription set with THRR, safety & activity barriers. Participant  demonstrates ability to understand and report RPE using BORG scale, to self-measure pulse accurately, and to acknowledge the importance of the exercise prescription.  Exercise Prescription Goal: Starting with aerobic activity 30 plus minutes a day, 3 days per week for initial exercise prescription. Provide home exercise prescription and guidelines that participant acknowledges understanding prior to discharge.  Activity Barriers & Risk Stratification:     Activity Barriers & Risk Stratification - 06/08/14 1400    Activity Barriers & Risk Stratification   Activity Barriers None   Risk Stratification Moderate      6 Minute Walk:     6 Minute Walk      06/08/14 1401       6 Minute Walk   Phase Initial     Distance 1670 feet     Walk Time 6 minutes     Resting HR 87 bpm     Resting BP 122/70 mmHg     Max Ex. HR 102 bpm     Max Ex. BP 136/62 mmHg     RPE 12        Initial Exercise Prescription:     Initial Exercise Prescription - 06/08/14 1400    Date of Initial Exercise Prescription   Date 06/08/14   Treadmill   MPH 3   Grade 0   Minutes 15  Bike   Level 0.4   Minutes 15   Recumbant Bike   Level 3   RPM 40   Watts 25   Minutes 15   NuStep   Level 3   Watts 40   Minutes 15   Arm Ergometer   Level 1   Watts 8   Minutes 10   Arm/Foot Ergometer   Level 4   Watts 15   Minutes 10   Cybex   Level 3   RPM 50   Minutes 15   Recumbant Elliptical   Level 1   RPM 40   Watts 10   Minutes 15   Elliptical   Level 1   Speed 3   Minutes 1   REL-XR   Level 3   Watts 45   Minutes 15   Prescription Details   Frequency (times per week) 3   Duration Progress to 30 minutes of continuous aerobic without signs/symptoms of physical distress   Intensity   THRR REST +  30   Ratings of Perceived Exertion 11-15   Resistance Training   Training Prescription Yes   Weight 2   Reps 10-12      Exercise Prescription Changes:   Discharge Exercise  Prescription:   Nutrition:  Target Goals: Understanding of nutrition guidelines, daily intake of sodium <1581m, cholesterol <209m calories 30% from fat and 7% or less from saturated fats, daily to have 5 or more servings of fruits and vegetables.  Biometrics:     Pre Biometrics - 06/08/14 1400    Pre Biometrics   Height _0  (1.778 m)   Weight 179 lb 4.8 oz (81.33 kg)   Waist Circumference 37.75 inches   Hip Circumference 37.75 inches   Waist to Hip Ratio 1 %   BMI (Calculated) 25.8       Nutrition Therapy Plan and Nutrition Goals:     Nutrition Therapy & Goals - 06/08/14 1458    Intervention Plan   Intervention Using nutrition plan and personal goals to gain a healthy nutrition lifestyle. Add exercise as prescribed.      Nutrition Discharge: Rate Your Plate Scores:     Rate Your Plate - [id number]  Rate Your Plate Scores   Pre Score --  Prefers not to fill out. Met with Dietician before when he was in Cardiac Rehab before.       Nutrition Goals Re-Evaluation:   Psychosocial: Target Goals: Acknowledge presence or absence of depression, maximize coping skills, provide positive support system. Participant is able to verbalize types and ability to use techniques and skills needed for reducing stress and depression.  Initial Review & Psychosocial Screening:     Initial Psych Review & Screening - 06/08/14 1500    Initial Review   Current issues with Current Sleep Concerns   Family Dynamics   Good Support System? Yes   Screening Interventions   Interventions Encouraged to exercise      Quality of Life Scores:     Quality of Life - 06/08/14 1500    Quality of Life Scores   Health/Function Pre 21.5 %   Socioeconomic Pre 27 %   Psych/Spiritual Pre 23.07 %   Family Pre 30 %   GLOBAL Pre 24.12 %      PHQ-9:     Recent Review Flowsheet Data    [facility] 06[facility]2014   Decreased Interest 0   Down, Depressed, Hopeless 2   PHQ - 2  Score 2   Altered sleeping 3   Tired, decreased energy 2   Change in appetite 1   Feeling bad or failure about yourself  2   Trouble concentrating 0   Moving slowly or fidgety/restless 0   Suicidal thoughts 0   PHQ-9 Score 10   Difficult doing work/chores Somewhat difficult      Psychosocial Evaluation and Intervention:   Psychosocial Re-Evaluation:   Vocational Rehabilitation: Provide vocational rehab assistance to qualifying candidates.   Vocational Rehab Evaluation & Intervention:     Vocational Rehab - 06/08/14 1451    Initial Vocational Rehab Evaluation & Intervention   Assessment shows need for Vocational Rehabilitation No      Education: Education Goals: Education classes will be provided on a weekly basis, covering required topics. Participant will state understanding/return demonstration of topics presented.  Learning Barriers/Preferences:     Learning Barriers/Preferences - 06/08/14 1448    Learning Barriers/Preferences   Learning Barriers None   Learning Preferences None      Education Topics: General Nutrition Guidelines/Fats and Fiber: -Group instruction provided by verbal, written material, models and posters to present the general guidelines for heart healthy nutrition. Gives an explanation and review of dietary fats and fiber.   Controlling Sodium/Reading Food Labels: -Group verbal and written material supporting the discussion of sodium use in heart healthy nutrition. Review and explanation with models, verbal and written materials for utilization of the food label.   Exercise Physiology & Risk Factors: - Group verbal and written instruction with models to review the exercise physiology of the cardiovascular system and associated critical values. Details cardiovascular disease risk factors and the goals associated with each risk factor.   Aerobic Exercise & Resistance Training: - Gives group verbal and written discussion on the health impact  of inactivity. On the components of aerobic and resistive training programs and the benefits of this training and how to safely progress through these programs.   Flexibility, Balance, General Exercise Guidelines: - Provides group verbal and written instruction on the benefits of flexibility and balance training programs. Provides general exercise guidelines with specific guidelines to those with heart or lung disease. Demonstration and skill practice provided.   Stress Management: - Provides group verbal and written instruction about the health risks of elevated stress, cause of high stress, and healthy ways to reduce stress.   Depression: - Provides group verbal and written instruction on the correlation between heart/lung disease and depressed mood, treatment options, and the stigmas associated with seeking treatment.   Anatomy & Physiology of the Heart: - Group verbal and written instruction and models provide basic cardiac anatomy and physiology, with the coronary electrical and arterial systems. Review of: AMI, Angina, Valve disease, Heart Failure, Cardiac Arrhythmia, Pacemakers, and the ICD.   Cardiac Procedures: - Group verbal and written instruction and models to describe the testing methods done to diagnose heart disease. Reviews the outcomes of the test results. Describes the treatment choices: Medical Management, Angioplasty, or Coronary Bypass Surgery.   Cardiac Medications: - Group verbal and written instruction to review commonly prescribed medications for heart disease. Reviews the medication, class of the drug, and side effects. Includes the steps to properly store meds and maintain the prescription regimen.   Go Sex-Intimacy & Heart Disease, Get SMART - Goal Setting: - Group verbal and written instruction through game format to discuss heart disease and the return to sexual intimacy. Provides group verbal and written material to discuss and apply goal setting through the  application of the S.M.A.R.T. Method.   Other Matters of the Heart: - Provides group verbal, written materials and models to describe Heart Failure, Angina, Valve Disease, and Diabetes in the realm of heart disease. Includes description of the disease process and treatment options available to the cardiac patient.   Exercise & Equipment Safety: - Individual verbal instruction and demonstration of equipment use and safety with use of the equipment.          Most Recent Value   Date  06/08/14   Educator  C. Alane Hanssen,RN   Instruction Review Code  1- partially meets, needs review/practice      Infection Prevention: - Provides verbal and written material to individual with discussion of infection control including proper hand washing and proper equipment cleaning during exercise session.      Most Recent Value   Date  06/08/14   Educator  C. Arryanna Holquin,RN   Instruction Review Code  2- meets goals/outcomes      Falls Prevention: - Provides verbal and written material to individual with discussion of falls prevention and safety.      Most Recent Value   Date  06/08/14   Educator  C. Laquinn Shippy,RN   Instruction Review Code  2- meets goals/outcomes      Diabetes: - Individual verbal and written instruction to review signs/symptoms of diabetes, desired ranges of glucose level fasting, after meals and with exercise. Advice that pre and post exercise glucose checks will be done for 3 sessions at entry of program.    Knowledge Questionnaire Score:     Knowledge Questionnaire Score - 06/08/14 1449    Knowledge Questionnaire Score   Pre Score 20      Personal Goals and Risk Factors at Admission:     Personal Goals and Risk Factors at Admission - 06/08/14 1459    Personal Goals and Risk Factors on Admission    Weight Management No   Increase Aerobic Exercise and Physical Activity Yes   Quit Smoking No   Take Less Medication No   Understand more about Heart/Pulmonary Disease. No    Improve shortness of breath with ADL's No   Increase knowledge of respiratory medications and ability to use respiratory devices properly.  No   Diabetes No      Personal Goals and Risk Factors Review:    Personal Goals Discharge:     Comments: Is returning to Cardiac Rehab after having to have another Cardiac Stent placed for his chest pain.

## 2014-06-08 NOTE — Patient Instructions (Signed)
Patient Instructions  Patient Details  Name: Scott [name] MRN: [id number] Date of Birth: March 10, 1940 Referring Provider:  Mikle Bosworth,*  Below are the personal goals you chose as well as exercise and nutrition goals. Our goal is to help you keep on track towards obtaining and maintaining your goals. We will be discussing your progress on these goals with you throughout the program.  Initial Exercise Prescription:     Initial Exercise Prescription - 06/08/14 1400    Date of Initial Exercise Prescription   Date 06/08/14   Treadmill   MPH 3   Grade 0   Minutes 15   Bike   Level 0.4   Minutes 15   Recumbant Bike   Level 3   RPM 40   Watts 25   Minutes 15   NuStep   Level 3   Watts 40   Minutes 15   Arm Ergometer   Level 1   Watts 8   Minutes 10   Arm/Foot Ergometer   Level 4   Watts 15   Minutes 10   Cybex   Level 3   RPM 50   Minutes 15   Recumbant Elliptical   Level 1   RPM 40   Watts 10   Minutes 15   Elliptical   Level 1   Speed 3   Minutes 1   REL-XR   Level 3   Watts 45   Minutes 15   Prescription Details   Frequency (times per week) 3   Duration Progress to 30 minutes of continuous aerobic without signs/symptoms of physical distress   Intensity   THRR REST +  30   Ratings of Perceived Exertion 11-15   Resistance Training   Training Prescription Yes   Weight 2   Reps 10-12      Exercise Goals: Frequency: Be able to perform aerobic exercise three times per week working toward 3-5 days per week.  Intensity: Work with a perceived exertion of 11 (fairly light) - 15 (hard) as tolerated. Follow your new exercise prescription and watch for changes in prescription as you progress with the program. Changes will be reviewed with you when they are made.  Duration: You should be able to do 30 minutes of continuous aerobic exercise in addition to a 5 minute warm-up and a 5 minute cool-down routine.  Nutrition Goals: Your personal nutrition  goals will be established when you do your nutrition analysis with the dietician.  The following are nutrition guidelines to follow: Cholesterol < 200mg /day Sodium < 1500mg /day Fiber: Men over 50 yrs - 30 grams per day  Personal Goals:     Personal Goals and Risk Factors at Admission - 06/08/14 1459    Personal Goals and Risk Factors on Admission    Weight Management No   Increase Aerobic Exercise and Physical Activity Yes   Quit Smoking No   Take Less Medication No   Understand more about Heart/Pulmonary Disease. No   Improve shortness of breath with ADL's No   Increase knowledge of respiratory medications and ability to use respiratory devices properly.  No   Diabetes No      Tobacco Use Initial Evaluation: History  Smoking status  . Former Smoker -- 1.00 packs/day for 35 years  . Types: Cigarettes  . Start date: 05/02/2009  Smokeless tobacco  . Not on file    Copy of goals given to participant.

## 2014-06-09 ENCOUNTER — Other Ambulatory Visit: Payer: Self-pay

## 2014-06-09 MED ORDER — PRASUGREL HCL 10 MG PO TABS: 10.0000 mg | ORAL_TABLET | Freq: Every day | ORAL | Status: DC

## 2014-06-09 NOTE — Telephone Encounter (Signed)
Refills sent for effient.

## 2014-06-12 ENCOUNTER — Ambulatory Visit: Payer: Medicare Other | Admitting: Cardiovascular Disease

## 2014-06-15 ENCOUNTER — Ambulatory Visit: Payer: Medicare Other

## 2014-06-16 ENCOUNTER — Encounter: Payer: Self-pay | Admitting: *Deleted

## 2014-06-16 NOTE — Progress Notes (Signed)
Cardiac Individual Treatment Plan  Patient Details  Name: Scott [name] MRN: [id number] Date of Birth: [birth year] Referring Provider: Dr. Jose Persia Initial Encounter Date:  06/08/2014  Diagnosis  PTCA  Patient's Home Medications on Admission:  Current outpatient prescriptions:  .  aspirin EC 81 MG tablet, Take 1 tablet (81 mg total) by mouth daily., Disp: , Rfl:  .  atorvastatin (LIPITOR) 80 MG tablet, Take by mouth., Disp: , Rfl:  .  isosorbide mononitrate (IMDUR) 30 MG 24 hr tablet, Take 30 mg by mouth 1 day or 1 dose., Disp: , Rfl:  .  losartan (COZAAR) 50 MG tablet, Take 50 mg by mouth daily., Disp: , Rfl:  .  metoprolol succinate (TOPROL-XL) 50 MG 24 hr tablet, Take by mouth., Disp: , Rfl:  .  nitroGLYCERIN (NITROSTAT) 0.4 MG SL tablet, Place 1 tablet (0.4 mg total) under the tongue every 5 (five) minutes as needed for chest pain., Disp: 90 tablet, Rfl: 3 .  prasugrel (EFFIENT) 10 MG TABS tablet, Take 1 tablet (10 mg total) by mouth daily., Disp: 30 tablet, Rfl: 11  Past Medical History: Past Medical History  Diagnosis Date  . CAD (coronary artery disease)     a. 04/2012 Cath: 3VD->Med Rx;  b. 04/2013 Cath/PCI: LAD 50p, D1 40, LCX 80p, OM2 99diff, RCA 70p, 53m, 95d. Mid & distal RCA treated with 2 DES.  . Cardiomyopathy, ischemic     a. 04/2012 Echo: EF 40-45%;  b. 08/2013 Echo: EF 45-50%, mild glob HK, mod lat/post HK, diast dysfxn, mildly dil LA, mild MR, nl RVSP.  Marland Kitchen Hyperlipidemia   . HTN (hypertension)   . Carotid arterial disease     a. 05/2013 Carotid U/S; bilat 40-50% ICA stenosis.    Tobacco Use: History  Smoking status  . Former Smoker -- 1.00 packs/day for 35 years  . Types: Cigarettes  . Start date: 05/02/2009  Smokeless tobacco  . Not on file    Labs: Recent Review Flowsheet Data    There is no flowsheet data to display.       Exercise Target Goals:    Exercise Program Goal: Individual exercise prescription set with THRR, safety & activity barriers.  Participant demonstrates ability to understand and report RPE using BORG scale, to self-measure pulse accurately, and to acknowledge the importance of the exercise prescription.  Exercise Prescription Goal: Starting with aerobic activity 30 plus minutes a day, 3 days per week for initial exercise prescription. Provide home exercise prescription and guidelines that participant acknowledges understanding prior to discharge.  Activity Barriers & Risk Stratification:     Activity Barriers & Risk Stratification - 06/08/14 1400    Activity Barriers & Risk Stratification   Activity Barriers None   Risk Stratification Moderate      6 Minute Walk:     6 Minute Walk      06/08/14 1401       6 Minute Walk   Phase Initial     Distance 1670 feet     Walk Time 6 minutes     Resting HR 87 bpm     Resting BP 122/70 mmHg     Max Ex. HR 102 bpm     Max Ex. BP 136/62 mmHg     RPE 12        Initial Exercise Prescription:     Initial Exercise Prescription - 06/08/14 1400    Date of Initial Exercise Prescription   Date 06/08/14   Treadmill   MPH 3  Grade 0   Minutes 15   Bike   Level 0.4   Minutes 15   Recumbant Bike   Level 3   RPM 40   Watts 25   Minutes 15   NuStep   Level 3   Watts 40   Minutes 15   Arm Ergometer   Level 1   Watts 8   Minutes 10   Arm/Foot Ergometer   Level 4   Watts 15   Minutes 10   Cybex   Level 3   RPM 50   Minutes 15   Recumbant Elliptical   Level 1   RPM 40   Watts 10   Minutes 15   Elliptical   Level 1   Speed 3   Minutes 1   REL-XR   Level 3   Watts 45   Minutes 15   Prescription Details   Frequency (times per week) 3   Duration Progress to 30 minutes of continuous aerobic without signs/symptoms of physical distress   Intensity   THRR REST +  30   Ratings of Perceived Exertion 11-15   Resistance Training   Training Prescription Yes   Weight 2   Reps 10-12      Exercise Prescription Changes:     Exercise  Prescription Changes      06/16/14 1000           Exercise Review   Progression No  Has not started program yet; on wait list          Discharge Exercise Prescription:   Nutrition:  Target Goals: Understanding of nutrition guidelines, daily intake of sodium '1500mg'$ , cholesterol '200mg'$ , calories 30% from fat and 7% or less from saturated fats, daily to have 5 or more servings of fruits and vegetables.  Biometrics:     Pre Biometrics - 06/08/14 1400    Pre Biometrics   Height $Remov'5\' 10"'sxLKfm$  (1.778 m)   Weight 179 lb 4.8 oz (81.33 kg)   Waist Circumference 37.75 inches   Hip Circumference 37.75 inches   Waist to Hip Ratio 1 %   BMI (Calculated) 25.8       Nutrition Therapy Plan and Nutrition Goals:     Nutrition Therapy & Goals - 06/08/14 1458    Intervention Plan   Intervention Using nutrition plan and personal goals to gain a healthy nutrition lifestyle. Add exercise as prescribed.      Nutrition Discharge: Rate Your Plate Scores:     Rate Your Plate - 97/41/63 8453    Rate Your Plate Scores   Pre Score --  Prefers not to fill out. Met with Dietician before when he was in Cardiac Rehab before.       Nutrition Goals Re-Evaluation:   Psychosocial: Target Goals: Acknowledge presence or absence of depression, maximize coping skills, provide positive support system. Participant is able to verbalize types and ability to use techniques and skills needed for reducing stress and depression.  Initial Review & Psychosocial Screening:     Initial Psych Review & Screening - 06/08/14 1500    Initial Review   Current issues with Current Sleep Concerns   Family Dynamics   Good Support System? Yes   Screening Interventions   Interventions Encouraged to exercise      Quality of Life Scores:     Quality of Life - 06/08/14 1500    Quality of Life Scores   Health/Function Pre 21.5 %   Socioeconomic Pre 27 %   Psych/Spiritual  Pre 23.07 %   Family Pre 30 %   GLOBAL Pre  24.12 %      PHQ-9:     Recent Review Flowsheet Data    Depression screen [facility] 2/9 06/08/2014   Decreased Interest 0   Down, Depressed, Hopeless 2   PHQ - 2 Score 2   Altered sleeping 3   Tired, decreased energy 2   Change in appetite 1   Feeling bad or failure about yourself  2   Trouble concentrating 0   Moving slowly or fidgety/restless 0   Suicidal thoughts 0   PHQ-9 Score 10   Difficult doing work/chores Somewhat difficult      Psychosocial Evaluation and Intervention:   Psychosocial Re-Evaluation:   Vocational Rehabilitation: Provide vocational rehab assistance to qualifying candidates.   Vocational Rehab Evaluation & Intervention:     Vocational Rehab - 06/08/14 1451    Initial Vocational Rehab Evaluation & Intervention   Assessment shows need for Vocational Rehabilitation No      Education: Education Goals: Education classes will be provided on a weekly basis, covering required topics. Participant will state understanding/return demonstration of topics presented.  Learning Barriers/Preferences:     Learning Barriers/Preferences - 06/08/14 1448    Learning Barriers/Preferences   Learning Barriers None   Learning Preferences None      Education Topics: General Nutrition Guidelines/Fats and Fiber: -Group instruction provided by verbal, written material, models and posters to present the general guidelines for heart healthy nutrition. Gives an explanation and review of dietary fats and fiber.   Controlling Sodium/Reading Food Labels: -Group verbal and written material supporting the discussion of sodium use in heart healthy nutrition. Review and explanation with models, verbal and written materials for utilization of the food label.   Exercise Physiology & Risk Factors: - Group verbal and written instruction with models to review the exercise physiology of the cardiovascular system and associated critical values. Details cardiovascular disease risk  factors and the goals associated with each risk factor.   Aerobic Exercise & Resistance Training: - Gives group verbal and written discussion on the health impact of inactivity. On the components of aerobic and resistive training programs and the benefits of this training and how to safely progress through these programs.   Flexibility, Balance, General Exercise Guidelines: - Provides group verbal and written instruction on the benefits of flexibility and balance training programs. Provides general exercise guidelines with specific guidelines to those with heart or lung disease. Demonstration and skill practice provided.   Stress Management: - Provides group verbal and written instruction about the health risks of elevated stress, cause of high stress, and healthy ways to reduce stress.   Depression: - Provides group verbal and written instruction on the correlation between heart/lung disease and depressed mood, treatment options, and the stigmas associated with seeking treatment.   Anatomy & Physiology of the Heart: - Group verbal and written instruction and models provide basic cardiac anatomy and physiology, with the coronary electrical and arterial systems. Review of: AMI, Angina, Valve disease, Heart Failure, Cardiac Arrhythmia, Pacemakers, and the ICD.   Cardiac Procedures: - Group verbal and written instruction and models to describe the testing methods done to diagnose heart disease. Reviews the outcomes of the test results. Describes the treatment choices: Medical Management, Angioplasty, or Coronary Bypass Surgery.   Cardiac Medications: - Group verbal and written instruction to review commonly prescribed medications for heart disease. Reviews the medication, class of the drug, and side effects. Includes the steps to  properly store meds and maintain the prescription regimen.   Go Sex-Intimacy & Heart Disease, Get SMART - Goal Setting: - Group verbal and written instruction  through game format to discuss heart disease and the return to sexual intimacy. Provides group verbal and written material to discuss and apply goal setting through the application of the S.M.A.R.T. Method.   Other Matters of the Heart: - Provides group verbal, written materials and models to describe Heart Failure, Angina, Valve Disease, and Diabetes in the realm of heart disease. Includes description of the disease process and treatment options available to the cardiac patient.   Exercise & Equipment Safety: - Individual verbal instruction and demonstration of equipment use and safety with use of the equipment.   Infection Prevention: - Provides verbal and written material to individual with discussion of infection control including proper hand washing and proper equipment cleaning during exercise session.   Falls Prevention: - Provides verbal and written material to individual with discussion of falls prevention and safety.   Diabetes: - Individual verbal and written instruction to review signs/symptoms of diabetes, desired ranges of glucose level fasting, after meals and with exercise. Advice that pre and post exercise glucose checks will be done for 3 sessions at entry of program.    Knowledge Questionnaire Score:     Knowledge Questionnaire Score - 06/08/14 1449    Knowledge Questionnaire Score   Pre Score 20      Personal Goals and Risk Factors at Admission:     Personal Goals and Risk Factors at Admission - 06/08/14 1459    Personal Goals and Risk Factors on Admission    Weight Management No   Increase Aerobic Exercise and Physical Activity Yes   Quit Smoking No   Take Less Medication No   Understand more about Heart/Pulmonary Disease. No   Improve shortness of breath with ADL's No   Increase knowledge of respiratory medications and ability to use respiratory devices properly.  No   Diabetes No      Personal Goals and Risk Factors Review:    Personal Goals  Discharge:     Comments: 30 day review.  Brittan has attended orientation and will start exercise sessions next week.

## 2014-06-16 NOTE — Progress Notes (Signed)
Cardiac Individual Treatment Plan  Patient Details  Name: Scott [name] MRN: [id number] Date of Birth: [birth year] Referring Provider:  No ref. provider found  Initial Encounter Date:    Patient's Home Medications on Admission:  Current outpatient prescriptions:  .  aspirin EC 81 MG tablet, Take 1 tablet (81 mg total) by mouth daily., Disp: , Rfl:  .  atorvastatin (LIPITOR) 80 MG tablet, Take by mouth., Disp: , Rfl:  .  isosorbide mononitrate (IMDUR) 30 MG 24 hr tablet, Take 30 mg by mouth 1 day or 1 dose., Disp: , Rfl:  .  losartan (COZAAR) 50 MG tablet, Take 50 mg by mouth daily., Disp: , Rfl:  .  metoprolol succinate (TOPROL-XL) 50 MG 24 hr tablet, Take by mouth., Disp: , Rfl:  .  nitroGLYCERIN (NITROSTAT) 0.4 MG SL tablet, Place 1 tablet (0.4 mg total) under the tongue every 5 (five) minutes as needed for chest pain., Disp: 90 tablet, Rfl: 3 .  prasugrel (EFFIENT) 10 MG TABS tablet, Take 1 tablet (10 mg total) by mouth daily., Disp: 30 tablet, Rfl: 11  Past Medical History: Past Medical History  Diagnosis Date  . CAD (coronary artery disease)     a. 04/2012 Cath: 3VD->Med Rx;  b. 04/2013 Cath/PCI: LAD 50p, D1 40, LCX 80p, OM2 99diff, RCA 70p, 2m 95d. Mid & distal RCA treated with 2 DES.  . Cardiomyopathy, ischemic     a. 04/2012 Echo: EF 40-45%;  b. 08/2013 Echo: EF 45-50%, mild glob HK, mod lat/post HK, diast dysfxn, mildly dil LA, mild MR, nl RVSP.  .Marland KitchenHyperlipidemia   . HTN (hypertension)   . Carotid arterial disease     a. 05/2013 Carotid U/S; bilat 40-50% ICA stenosis.    Tobacco Use: History  Smoking status  . Former Smoker -- 1.00 packs/day for 35 years  . Types: Cigarettes  . Start date: 05/02/2009  Smokeless tobacco  . Not on file    Labs: Recent Review Flowsheet Data    There is no flowsheet data to display.       Exercise Target Goals:    Exercise Program Goal: Individual exercise prescription set with THRR, safety & activity barriers. Participant  demonstrates ability to understand and report RPE using BORG scale, to self-measure pulse accurately, and to acknowledge the importance of the exercise prescription.  Exercise Prescription Goal: Starting with aerobic activity 30 plus minutes a day, 3 days per week for initial exercise prescription. Provide home exercise prescription and guidelines that participant acknowledges understanding prior to discharge.  Activity Barriers & Risk Stratification:     Activity Barriers & Risk Stratification - 06/08/14 1400    Activity Barriers & Risk Stratification   Activity Barriers None   Risk Stratification Moderate      6 Minute Walk:     6 Minute Walk      06/08/14 1401       6 Minute Walk   Phase Initial     Distance 1670 feet     Walk Time 6 minutes     Resting HR 87 bpm     Resting BP 122/70 mmHg     Max Ex. HR 102 bpm     Max Ex. BP 136/62 mmHg     RPE 12        Initial Exercise Prescription:     Initial Exercise Prescription - 06/08/14 1400    Date of Initial Exercise Prescription   Date 06/08/14   Treadmill   MPH 3  Grade 0   Minutes 15   Bike   Level 0.4   Minutes 15   Recumbant Bike   Level 3   RPM 40   Watts 25   Minutes 15   NuStep   Level 3   Watts 40   Minutes 15   Arm Ergometer   Level 1   Watts 8   Minutes 10   Arm/Foot Ergometer   Level 4   Watts 15   Minutes 10   Cybex   Level 3   RPM 50   Minutes 15   Recumbant Elliptical   Level 1   RPM 40   Watts 10   Minutes 15   Elliptical   Level 1   Speed 3   Minutes 1   REL-XR   Level 3   Watts 45   Minutes 15   Prescription Details   Frequency (times per week) 3   Duration Progress to 30 minutes of continuous aerobic without signs/symptoms of physical distress   Intensity   THRR REST +  30   Ratings of Perceived Exertion 11-15   Resistance Training   Training Prescription Yes   Weight 2   Reps 10-12      Exercise Prescription Changes:     Exercise Prescription Changes       06/16/14 1000           Exercise Review   Progression No  Has not started program yet; on wait list          Discharge Exercise Prescription:   Nutrition:  Target Goals: Understanding of nutrition guidelines, daily intake of sodium <1540m, cholesterol <2013m calories 30% from fat and 7% or less from saturated fats, daily to have 5 or more servings of fruits and vegetables.  Biometrics:     Pre Biometrics - 06/08/14 1400    Pre Biometrics   Height _0  (1.778 m)   Weight 179 lb 4.8 oz (81.33 kg)   Waist Circumference 37.75 inches   Hip Circumference 37.75 inches   Waist to Hip Ratio 1 %   BMI (Calculated) 25.8       Nutrition Therapy Plan and Nutrition Goals:     Nutrition Therapy & Goals - 06/08/14 1458    Intervention Plan   Intervention Using nutrition plan and personal goals to gain a healthy nutrition lifestyle. Add exercise as prescribed.      Nutrition Discharge: Rate Your Plate Scores:     Rate Your Plate - 0541/32/4440102  Rate Your Plate Scores   Pre Score --  Prefers not to fill out. Met with Dietician before when he was in Cardiac Rehab before.       Nutrition Goals Re-Evaluation:   Psychosocial: Target Goals: Acknowledge presence or absence of depression, maximize coping skills, provide positive support system. Participant is able to verbalize types and ability to use techniques and skills needed for reducing stress and depression.  Initial Review & Psychosocial Screening:     Initial Psych Review & Screening - 06/08/14 1500    Initial Review   Current issues with Current Sleep Concerns   Family Dynamics   Good Support System? Yes   Screening Interventions   Interventions Encouraged to exercise      Quality of Life Scores:     Quality of Life - 06/08/14 1500    Quality of Life Scores   Health/Function Pre 21.5 %   Socioeconomic Pre 27 %   Psych/Spiritual  Pre 23.07 %   Family Pre 30 %   GLOBAL Pre 24.12 %       PHQ-9:     Recent Review Flowsheet Data    Depression screen [facility] 2/9 06/08/2014   Decreased Interest 0   Down, Depressed, Hopeless 2   PHQ - 2 Score 2   Altered sleeping 3   Tired, decreased energy 2   Change in appetite 1   Feeling bad or failure about yourself  2   Trouble concentrating 0   Moving slowly or fidgety/restless 0   Suicidal thoughts 0   PHQ-9 Score 10   Difficult doing work/chores Somewhat difficult      Psychosocial Evaluation and Intervention:   Psychosocial Re-Evaluation:   Vocational Rehabilitation: Provide vocational rehab assistance to qualifying candidates.   Vocational Rehab Evaluation & Intervention:     Vocational Rehab - 06/08/14 1451    Initial Vocational Rehab Evaluation & Intervention   Assessment shows need for Vocational Rehabilitation No      Education: Education Goals: Education classes will be provided on a weekly basis, covering required topics. Participant will state understanding/return demonstration of topics presented.  Learning Barriers/Preferences:     Learning Barriers/Preferences - 06/08/14 1448    Learning Barriers/Preferences   Learning Barriers None   Learning Preferences None      Education Topics: General Nutrition Guidelines/Fats and Fiber: -Group instruction provided by verbal, written material, models and posters to present the general guidelines for heart healthy nutrition. Gives an explanation and review of dietary fats and fiber.   Controlling Sodium/Reading Food Labels: -Group verbal and written material supporting the discussion of sodium use in heart healthy nutrition. Review and explanation with models, verbal and written materials for utilization of the food label.   Exercise Physiology & Risk Factors: - Group verbal and written instruction with models to review the exercise physiology of the cardiovascular system and associated critical values. Details cardiovascular disease risk factors and  the goals associated with each risk factor.   Aerobic Exercise & Resistance Training: - Gives group verbal and written discussion on the health impact of inactivity. On the components of aerobic and resistive training programs and the benefits of this training and how to safely progress through these programs.   Flexibility, Balance, General Exercise Guidelines: - Provides group verbal and written instruction on the benefits of flexibility and balance training programs. Provides general exercise guidelines with specific guidelines to those with heart or lung disease. Demonstration and skill practice provided.   Stress Management: - Provides group verbal and written instruction about the health risks of elevated stress, cause of high stress, and healthy ways to reduce stress.   Depression: - Provides group verbal and written instruction on the correlation between heart/lung disease and depressed mood, treatment options, and the stigmas associated with seeking treatment.   Anatomy & Physiology of the Heart: - Group verbal and written instruction and models provide basic cardiac anatomy and physiology, with the coronary electrical and arterial systems. Review of: AMI, Angina, Valve disease, Heart Failure, Cardiac Arrhythmia, Pacemakers, and the ICD.   Cardiac Procedures: - Group verbal and written instruction and models to describe the testing methods done to diagnose heart disease. Reviews the outcomes of the test results. Describes the treatment choices: Medical Management, Angioplasty, or Coronary Bypass Surgery.   Cardiac Medications: - Group verbal and written instruction to review commonly prescribed medications for heart disease. Reviews the medication, class of the drug, and side effects. Includes the steps to  properly store meds and maintain the prescription regimen.   Go Sex-Intimacy & Heart Disease, Get SMART - Goal Setting: - Group verbal and written instruction through game  format to discuss heart disease and the return to sexual intimacy. Provides group verbal and written material to discuss and apply goal setting through the application of the S.M.A.R.T. Method.   Other Matters of the Heart: - Provides group verbal, written materials and models to describe Heart Failure, Angina, Valve Disease, and Diabetes in the realm of heart disease. Includes description of the disease process and treatment options available to the cardiac patient.   Exercise & Equipment Safety: - Individual verbal instruction and demonstration of equipment use and safety with use of the equipment.   Infection Prevention: - Provides verbal and written material to individual with discussion of infection control including proper hand washing and proper equipment cleaning during exercise session.   Falls Prevention: - Provides verbal and written material to individual with discussion of falls prevention and safety.   Diabetes: - Individual verbal and written instruction to review signs/symptoms of diabetes, desired ranges of glucose level fasting, after meals and with exercise. Advice that pre and post exercise glucose checks will be done for 3 sessions at entry of program.    Knowledge Questionnaire Score:     Knowledge Questionnaire Score - 06/08/14 1449    Knowledge Questionnaire Score   Pre Score 20      Personal Goals and Risk Factors at Admission:     Personal Goals and Risk Factors at Admission - 06/08/14 1459    Personal Goals and Risk Factors on Admission    Weight Management No   Increase Aerobic Exercise and Physical Activity Yes   Quit Smoking No   Take Less Medication No   Understand more about Heart/Pulmonary Disease. No   Improve shortness of breath with ADL's No   Increase knowledge of respiratory medications and ability to use respiratory devices properly.  No   Diabetes No      Personal Goals and Risk Factors Review:    Personal Goals Discharge:      Comments: 30 day review.  Zyair has attended orientation and will start exercise sessions next week.

## 2014-06-17 ENCOUNTER — Other Ambulatory Visit: Payer: Self-pay | Admitting: *Deleted

## 2014-06-17 ENCOUNTER — Ambulatory Visit: Payer: Medicare Other

## 2014-06-17 DIAGNOSIS — Z955 Presence of coronary angioplasty implant and graft: Secondary | ICD-10-CM

## 2014-06-19 ENCOUNTER — Ambulatory Visit: Payer: Medicare Other

## 2014-06-24 ENCOUNTER — Ambulatory Visit: Payer: Medicare Other

## 2014-06-24 ENCOUNTER — Telehealth: Payer: Self-pay | Admitting: *Deleted

## 2014-06-24 NOTE — Telephone Encounter (Signed)
Scott [name] called and said he has has been having chest pain and is going to have open heart surgery at [facility] on Friday. Wants to come to Cardiac Rehab after that.

## 2014-06-25 ENCOUNTER — Ambulatory Visit: Payer: Medicare Other

## 2014-06-25 ENCOUNTER — Other Ambulatory Visit: Payer: Medicare Other

## 2014-06-26 ENCOUNTER — Ambulatory Visit: Payer: Medicare Other

## 2014-06-26 HISTORY — PX: CORONARY ARTERY BYPASS GRAFT: SHX141

## 2014-06-29 ENCOUNTER — Ambulatory Visit: Payer: Medicare Other

## 2014-06-30 ENCOUNTER — Ambulatory Visit: Payer: Medicare Other

## 2014-07-01 ENCOUNTER — Ambulatory Visit: Payer: Medicare Other

## 2014-07-02 ENCOUNTER — Ambulatory Visit: Payer: Medicare Other

## 2014-07-03 ENCOUNTER — Telehealth: Payer: Self-pay | Admitting: *Deleted

## 2014-07-03 ENCOUNTER — Ambulatory Visit: Payer: Medicare Other

## 2014-07-03 NOTE — Telephone Encounter (Signed)
Scott [name] called to say he had open heart surgery and he is sore. He is going to get MD to send a MD order and release for him to exercise in Cardiac Rehab when he sees the doctor next week.

## 2014-07-06 ENCOUNTER — Ambulatory Visit: Payer: Medicare Other

## 2014-07-07 ENCOUNTER — Ambulatory Visit: Payer: Medicare Other

## 2014-07-08 ENCOUNTER — Ambulatory Visit: Payer: Medicare Other

## 2014-07-09 ENCOUNTER — Ambulatory Visit: Payer: Medicare Other

## 2014-07-10 ENCOUNTER — Ambulatory Visit: Payer: Medicare Other

## 2014-07-10 NOTE — Addendum Note (Signed)
Addended by: Gerlene Burdock on: 07/10/2014 01:02 PM   Modules accepted: Orders

## 2014-07-13 ENCOUNTER — Ambulatory Visit: Payer: Medicare Other

## 2014-07-14 ENCOUNTER — Ambulatory Visit: Payer: Medicare Other

## 2014-07-14 ENCOUNTER — Encounter: Payer: Self-pay | Admitting: *Deleted

## 2014-07-14 DIAGNOSIS — Z9861 Coronary angioplasty status: Secondary | ICD-10-CM

## 2014-07-14 NOTE — Progress Notes (Signed)
Cardiac Individual Treatment Plan  Patient Details  Name: Scott [name] MRN: [id number] Date of Birth: 1940/10/29 Referring Provider:  Dr. Colleen Can Initial Encounter Date:  06/08/2014  Visit Diagnosis: H/O percutaneous transluminal coronary angioplasty  Patient's Home Medications on Admission:  Current outpatient prescriptions:  .  aspirin EC 81 MG tablet, Take 1 tablet (81 mg total) by mouth daily., Disp: , Rfl:  .  atorvastatin (LIPITOR) 80 MG tablet, Take by mouth., Disp: , Rfl:  .  isosorbide mononitrate (IMDUR) 30 MG 24 hr tablet, Take 30 mg by mouth 1 day or 1 dose., Disp: , Rfl:  .  losartan (COZAAR) 50 MG tablet, Take 50 mg by mouth daily., Disp: , Rfl:  .  metoprolol succinate (TOPROL-XL) 50 MG 24 hr tablet, Take by mouth., Disp: , Rfl:  .  nitroGLYCERIN (NITROSTAT) 0.4 MG SL tablet, Place 1 tablet (0.4 mg total) under the tongue every 5 (five) minutes as needed for chest pain., Disp: 90 tablet, Rfl: 3 .  prasugrel (EFFIENT) 10 MG TABS tablet, Take 1 tablet (10 mg total) by mouth daily., Disp: 30 tablet, Rfl: 11  Past Medical History: Past Medical History  Diagnosis Date  . CAD (coronary artery disease)     a. 04/2012 Cath: 3VD->Med Rx;  b. 04/2013 Cath/PCI: LAD 50p, D1 40, LCX 80p, OM2 99diff, RCA 70p, 54m, 95d. Mid & distal RCA treated with 2 DES.  . Cardiomyopathy, ischemic     a. 04/2012 Echo: EF 40-45%;  b. 08/2013 Echo: EF 45-50%, mild glob HK, mod lat/post HK, diast dysfxn, mildly dil LA, mild MR, nl RVSP.  Marland Kitchen Hyperlipidemia   . HTN (hypertension)   . Carotid arterial disease     a. 05/2013 Carotid U/S; bilat 40-50% ICA stenosis.    Tobacco Use: History  Smoking status  . Former Smoker -- 1.00 packs/day for 35 years  . Types: Cigarettes  . Start date: 05/02/2009  Smokeless tobacco  . Not on file    Labs: Recent Review Flowsheet Data    There is no flowsheet data to display.       Exercise Target Goals:    Exercise Program Goal: Individual exercise  prescription set with THRR, safety & activity barriers. Participant demonstrates ability to understand and report RPE using BORG scale, to self-measure pulse accurately, and to acknowledge the importance of the exercise prescription.  Exercise Prescription Goal: Starting with aerobic activity 30 plus minutes a day, 3 days per week for initial exercise prescription. Provide home exercise prescription and guidelines that participant acknowledges understanding prior to discharge.  Activity Barriers & Risk Stratification:     Activity Barriers & Risk Stratification - 06/08/14 1400    Activity Barriers & Risk Stratification   Activity Barriers None   Risk Stratification Moderate      6 Minute Walk:     6 Minute Walk      06/08/14 1401       6 Minute Walk   Phase Initial     Distance 1670 feet     Walk Time 6 minutes     Resting HR 87 bpm     Resting BP 122/70 mmHg     Max Ex. HR 102 bpm     Max Ex. BP 136/62 mmHg     RPE 12        Initial Exercise Prescription:     Initial Exercise Prescription - 06/08/14 1400    Date of Initial Exercise Prescription   Date 06/08/14  Treadmill   MPH 3   Grade 0   Minutes 15   Bike   Level 0.4   Minutes 15   Recumbant Bike   Level 3   RPM 40   Watts 25   Minutes 15   NuStep   Level 3   Watts 40   Minutes 15   Arm Ergometer   Level 1   Watts 8   Minutes 10   Arm/Foot Ergometer   Level 4   Watts 15   Minutes 10   Cybex   Level 3   RPM 50   Minutes 15   Recumbant Elliptical   Level 1   RPM 40   Watts 10   Minutes 15   Elliptical   Level 1   Speed 3   Minutes 1   REL-XR   Level 3   Watts 45   Minutes 15   Prescription Details   Frequency (times per week) 3   Duration Progress to 30 minutes of continuous aerobic without signs/symptoms of physical distress   Intensity   THRR REST +  30   Ratings of Perceived Exertion 11-15   Resistance Training   Training Prescription Yes   Weight 2   Reps 10-12       Exercise Prescription Changes:     Exercise Prescription Changes      06/16/14 1000           Exercise Review   Progression No  Has not started program yet; on wait list          Discharge Exercise Prescription (Final Exercise Prescription Changes):     Exercise Prescription Changes - 06/16/14 1000    Exercise Review   Progression No  Has not started program yet; on wait list      Nutrition:  Target Goals: Understanding of nutrition guidelines, daily intake of sodium '1500mg'$ , cholesterol '200mg'$ , calories 30% from fat and 7% or less from saturated fats, daily to have 5 or more servings of fruits and vegetables.  Biometrics:     Pre Biometrics - 06/08/14 1400    Pre Biometrics   Height $Remov'5\' 10"'XdgRYD$  (1.778 m)   Weight 179 lb 4.8 oz (81.33 kg)   Waist Circumference 37.75 inches   Hip Circumference 37.75 inches   Waist to Hip Ratio 1 %   BMI (Calculated) 25.8       Nutrition Therapy Plan and Nutrition Goals:     Nutrition Therapy & Goals - 06/08/14 1458    Intervention Plan   Intervention Using nutrition plan and personal goals to gain a healthy nutrition lifestyle. Add exercise as prescribed.      Nutrition Discharge: Rate Your Plate Scores:     Rate Your Plate - 90/38/33 3832    Rate Your Plate Scores   Pre Score --  Prefers not to fill out. Met with Dietician before when he was in Cardiac Rehab before.       Nutrition Goals Re-Evaluation:   Psychosocial: Target Goals: Acknowledge presence or absence of depression, maximize coping skills, provide positive support system. Participant is able to verbalize types and ability to use techniques and skills needed for reducing stress and depression.  Initial Review & Psychosocial Screening:     Initial Psych Review & Screening - 06/08/14 1500    Initial Review   Current issues with Current Sleep Concerns   Family Dynamics   Good Support System? Yes   Screening Interventions   Interventions Encouraged  to exercise      Quality of Life Scores:     Quality of Life - 06/08/14 1500    Quality of Life Scores   Health/Function Pre 21.5 %   Socioeconomic Pre 27 %   Psych/Spiritual Pre 23.07 %   Family Pre 30 %   GLOBAL Pre 24.12 %      PHQ-9:     Recent Review Flowsheet Data    Depression screen [facility] 2/9 06/08/2014   Decreased Interest 0   Down, Depressed, Hopeless 2   PHQ - 2 Score 2   Altered sleeping 3   Tired, decreased energy 2   Change in appetite 1   Feeling bad or failure about yourself  2   Trouble concentrating 0   Moving slowly or fidgety/restless 0   Suicidal thoughts 0   PHQ-9 Score 10   Difficult doing work/chores Somewhat difficult      Psychosocial Evaluation and Intervention:   Psychosocial Re-Evaluation:   Vocational Rehabilitation: Provide vocational rehab assistance to qualifying candidates.   Vocational Rehab Evaluation & Intervention:     Vocational Rehab - 06/08/14 1451    Initial Vocational Rehab Evaluation & Intervention   Assessment shows need for Vocational Rehabilitation No      Education: Education Goals: Education classes will be provided on a weekly basis, covering required topics. Participant will state understanding/return demonstration of topics presented.  Learning Barriers/Preferences:     Learning Barriers/Preferences - 06/08/14 1448    Learning Barriers/Preferences   Learning Barriers None   Learning Preferences None      Education Topics: General Nutrition Guidelines/Fats and Fiber: -Group instruction provided by verbal, written material, models and posters to present the general guidelines for heart healthy nutrition. Gives an explanation and review of dietary fats and fiber.   Controlling Sodium/Reading Food Labels: -Group verbal and written material supporting the discussion of sodium use in heart healthy nutrition. Review and explanation with models, verbal and written materials for utilization of the food  label.   Exercise Physiology & Risk Factors: - Group verbal and written instruction with models to review the exercise physiology of the cardiovascular system and associated critical values. Details cardiovascular disease risk factors and the goals associated with each risk factor.   Aerobic Exercise & Resistance Training: - Gives group verbal and written discussion on the health impact of inactivity. On the components of aerobic and resistive training programs and the benefits of this training and how to safely progress through these programs.   Flexibility, Balance, General Exercise Guidelines: - Provides group verbal and written instruction on the benefits of flexibility and balance training programs. Provides general exercise guidelines with specific guidelines to those with heart or lung disease. Demonstration and skill practice provided.   Stress Management: - Provides group verbal and written instruction about the health risks of elevated stress, cause of high stress, and healthy ways to reduce stress.   Depression: - Provides group verbal and written instruction on the correlation between heart/lung disease and depressed mood, treatment options, and the stigmas associated with seeking treatment.   Anatomy & Physiology of the Heart: - Group verbal and written instruction and models provide basic cardiac anatomy and physiology, with the coronary electrical and arterial systems. Review of: AMI, Angina, Valve disease, Heart Failure, Cardiac Arrhythmia, Pacemakers, and the ICD.   Cardiac Procedures: - Group verbal and written instruction and models to describe the testing methods done to diagnose heart disease. Reviews the outcomes of the test results. Describes  the treatment choices: Medical Management, Angioplasty, or Coronary Bypass Surgery.   Cardiac Medications: - Group verbal and written instruction to review commonly prescribed medications for heart disease. Reviews the  medication, class of the drug, and side effects. Includes the steps to properly store meds and maintain the prescription regimen.   Go Sex-Intimacy & Heart Disease, Get SMART - Goal Setting: - Group verbal and written instruction through game format to discuss heart disease and the return to sexual intimacy. Provides group verbal and written material to discuss and apply goal setting through the application of the S.M.A.R.T. Method.   Other Matters of the Heart: - Provides group verbal, written materials and models to describe Heart Failure, Angina, Valve Disease, and Diabetes in the realm of heart disease. Includes description of the disease process and treatment options available to the cardiac patient.   Exercise & Equipment Safety: - Individual verbal instruction and demonstration of equipment use and safety with use of the equipment.   Infection Prevention: - Provides verbal and written material to individual with discussion of infection control including proper hand washing and proper equipment cleaning during exercise session.   Falls Prevention: - Provides verbal and written material to individual with discussion of falls prevention and safety.   Diabetes: - Individual verbal and written instruction to review signs/symptoms of diabetes, desired ranges of glucose level fasting, after meals and with exercise. Advice that pre and post exercise glucose checks will be done for 3 sessions at entry of program.    Knowledge Questionnaire Score:     Knowledge Questionnaire Score - 06/08/14 1449    Knowledge Questionnaire Score   Pre Score 20      Personal Goals and Risk Factors at Admission:     Personal Goals and Risk Factors at Admission - 06/08/14 1459    Personal Goals and Risk Factors on Admission    Weight Management No   Increase Aerobic Exercise and Physical Activity Yes   Quit Smoking No   Take Less Medication No   Understand more about Heart/Pulmonary Disease.  No   Improve shortness of breath with ADL's No   Increase knowledge of respiratory medications and ability to use respiratory devices properly.  No   Diabetes No      Personal Goals and Risk Factors Review:    Personal Goals Discharge:     Comments: 30 day review. Continue with ITP.   Scott [name] is out for CABG, will return when released by MD.

## 2014-07-15 ENCOUNTER — Other Ambulatory Visit: Payer: Self-pay | Admitting: *Deleted

## 2014-07-15 ENCOUNTER — Ambulatory Visit: Payer: Medicare Other

## 2014-07-15 DIAGNOSIS — Z9861 Coronary angioplasty status: Secondary | ICD-10-CM

## 2014-07-16 ENCOUNTER — Ambulatory Visit: Payer: Medicare Other

## 2014-07-17 ENCOUNTER — Ambulatory Visit: Payer: Medicare Other

## 2014-07-20 ENCOUNTER — Ambulatory Visit: Payer: Medicare Other

## 2014-07-21 ENCOUNTER — Ambulatory Visit: Payer: Medicare Other

## 2014-07-22 ENCOUNTER — Ambulatory Visit: Payer: Medicare Other

## 2014-07-23 ENCOUNTER — Ambulatory Visit: Payer: Medicare Other

## 2014-07-24 ENCOUNTER — Ambulatory Visit: Payer: Medicare Other

## 2014-07-28 ENCOUNTER — Encounter: Payer: Self-pay | Admitting: *Deleted

## 2014-07-28 ENCOUNTER — Ambulatory Visit: Payer: Medicare Other

## 2014-07-29 ENCOUNTER — Ambulatory Visit: Payer: Medicare Other

## 2014-07-29 ENCOUNTER — Encounter: Payer: Medicare Other | Attending: Cardiology

## 2014-07-29 DIAGNOSIS — Z9861 Coronary angioplasty status: Secondary | ICD-10-CM

## 2014-07-29 DIAGNOSIS — Z955 Presence of coronary angioplasty implant and graft: Secondary | ICD-10-CM | POA: Diagnosis not present

## 2014-07-29 NOTE — Progress Notes (Signed)
Daily Session Note  Patient Details  Name: Scott [name] MRN: [id number] Date of Birth: 28-Feb-1940 Referring Provider:  Mikle Bosworth,*  Encounter Date: 07/29/2014  Check In:     Session Check In - 07/29/14 0817    Check-In   Staff Present Heath Lark RN, BSN, CCRP;Brynnlee Cumpian BS, ACSM EP-C, Exercise Physiologist;Renee Dillard Essex MS, ACSM CEP Exercise Physiologist   ER physicians immediately available to respond to emergencies See telemetry face sheet for immediately available ER MD   Medication changes reported     No   Fall or balance concerns reported    No   Warm-up and Cool-down Performed on first and last piece of equipment   VAD Patient? No   Pain Assessment   Currently in Pain? No/denies         Goals Met:  Proper associated with RPD/PD & O2 Sat Exercise tolerated well Personal goals reviewed Strength training completed today  Goals Unmet:  Not Applicable  Goals Comments:    Dr. Emily Filbert is Medical Director for [place] and [facility].

## 2014-07-30 ENCOUNTER — Ambulatory Visit: Payer: Medicare Other

## 2014-07-31 ENCOUNTER — Encounter: Payer: Medicare Other | Admitting: *Deleted

## 2014-07-31 ENCOUNTER — Ambulatory Visit: Payer: Medicare Other

## 2014-07-31 DIAGNOSIS — Z955 Presence of coronary angioplasty implant and graft: Secondary | ICD-10-CM | POA: Diagnosis not present

## 2014-07-31 NOTE — Progress Notes (Signed)
Daily Session Note  Patient Details  Name: YARETH MACDONNELL MRN: [id number] Date of Birth: 01/16/1941 Referring Provider:  Madelyn Brunner, MD  Encounter Date: 07/31/2014  Check In:     Session Check In - 07/31/14 0843    Check-In   Staff Present Candiss Norse MS, ACSM CEP Exercise Physiologist;Stacey Blanch Media RRT, RCP Respiratory Therapist;Diane Joya Gaskins RN, BSN   ER physicians immediately available to respond to emergencies See telemetry face sheet for immediately available ER MD   Medication changes reported     No   Fall or balance concerns reported    No   Warm-up and Cool-down Performed on first and last piece of equipment   VAD Patient? No   Pain Assessment   Currently in Pain? No/denies   Multiple Pain Sites No         Goals Met:  Independence with exercise equipment Exercise tolerated well Personal goals reviewed No report of cardiac concerns or symptoms Strength training completed today  Goals Unmet:  Not Applicable  Goals Comments:    Dr. Emily Filbert is Medical Director for [place] and [facility].

## 2014-08-03 ENCOUNTER — Encounter: Payer: Medicare Other | Admitting: *Deleted

## 2014-08-03 ENCOUNTER — Ambulatory Visit: Payer: Medicare Other

## 2014-08-03 DIAGNOSIS — Z9861 Coronary angioplasty status: Secondary | ICD-10-CM

## 2014-08-03 DIAGNOSIS — Z955 Presence of coronary angioplasty implant and graft: Secondary | ICD-10-CM | POA: Diagnosis not present

## 2014-08-03 NOTE — Progress Notes (Signed)
Daily Session Note  Patient Details  Name: ELBA DENDINGER MRN: [id number] Date of Birth: [birth year] Referring Provider:  Madelyn Brunner, MD  Encounter Date: 08/03/2014  Check In:     Session Check In - 08/03/14 0852    Check-In   Staff Present Candiss Norse MS, ACSM CEP Exercise Physiologist;Susanne Bice RN, BSN, CCRP;Nakai Pollio Alfonso Patten, ACSM CEP Exercise Physiologist   ER physicians immediately available to respond to emergencies See telemetry face sheet for immediately available ER MD   Medication changes reported     No   Fall or balance concerns reported    No   Warm-up and Cool-down Performed on first and last piece of equipment   VAD Patient? No   Pain Assessment   Currently in Pain? Yes  Pain was at a 6 on Saturday and is now down to a 3.    Pain Score 6    Pain Location Chest   Pain Descriptors / Indicators Aching   Pain Onset Other (comment)  Patient used T5 NS on Friday and his chest was very sore on Saturday from this exercise.    Aggravating Factors  Patient used T5 NS on Friday, which caused the sorness the following day. Patient was advised to avoid using his arms at all during exercise until pain is gone.    Multiple Pain Sites No         Goals Met:  Independence with exercise equipment Exercise tolerated well No report of cardiac concerns or symptoms  Goals Unmet:  Not Applicable  Goals Comments:    Dr. Emily Filbert is Medical Director for [place] and [facility].

## 2014-08-04 ENCOUNTER — Ambulatory Visit: Payer: Medicare Other

## 2014-08-05 ENCOUNTER — Ambulatory Visit
Admission: RE | Admit: 2014-08-05 | Discharge: 2014-08-05 | Disposition: A | Discharge status: Home / Self Care | Payer: Medicare Other | Source: Ambulatory Visit | Attending: Specialist | Admitting: Specialist

## 2014-08-05 ENCOUNTER — Encounter: Payer: Medicare Other | Admitting: *Deleted

## 2014-08-05 ENCOUNTER — Ambulatory Visit: Payer: Medicare Other

## 2014-08-05 DIAGNOSIS — Z9861 Coronary angioplasty status: Secondary | ICD-10-CM

## 2014-08-05 DIAGNOSIS — Z955 Presence of coronary angioplasty implant and graft: Secondary | ICD-10-CM | POA: Diagnosis not present

## 2014-08-05 DIAGNOSIS — J9811 Atelectasis: Secondary | ICD-10-CM | POA: Diagnosis not present

## 2014-08-05 DIAGNOSIS — R918 Other nonspecific abnormal finding of lung field: Secondary | ICD-10-CM | POA: Diagnosis present

## 2014-08-05 DIAGNOSIS — R911 Solitary pulmonary nodule: Secondary | ICD-10-CM | POA: Diagnosis not present

## 2014-08-05 DIAGNOSIS — J439 Emphysema, unspecified: Secondary | ICD-10-CM | POA: Diagnosis not present

## 2014-08-05 NOTE — Progress Notes (Signed)
Daily Session Note  Patient Details  Name: Scott [name] MRN: [id number] Date of Birth: 10/20/1940 Referring Provider:  Madelyn Brunner, MD  Encounter Date: 08/05/2014  Check In:     Session Check In - 08/05/14 0944    Check-In   Staff Present Heath Lark RN, BSN, CCRP;Steven Way BS, ACSM EP-C, Exercise Physiologist;Irania Durell Dillard Essex MS, ACSM CEP Exercise Physiologist   ER physicians immediately available to respond to emergencies See telemetry face sheet for immediately available ER MD   Medication changes reported     No   Fall or balance concerns reported    No   Warm-up and Cool-down Performed on first and last piece of equipment   VAD Patient? No   Pain Assessment   Currently in Pain? Yes   Pain Score 6    Pain Location Hip   Multiple Pain Sites No         Goals Met:  Independence with exercise equipment Personal goals reviewed No report of cardiac concerns or symptoms  Goals Unmet:  Could not do BioStep because it hurt his hip. Cannot use the arms on any of the equipment because it causes his chest to be sore. The treadmill does not bother him so he did that today and also did the NuStep. He will report on Friday if the NuStep caused any soreness.   Goals Comments:    Dr. Emily Filbert is Medical Director for [place] and [facility].

## 2014-08-06 ENCOUNTER — Ambulatory Visit: Payer: Medicare Other

## 2014-08-07 ENCOUNTER — Encounter: Payer: Medicare Other | Admitting: *Deleted

## 2014-08-07 ENCOUNTER — Ambulatory Visit: Payer: Medicare Other

## 2014-08-07 DIAGNOSIS — Z955 Presence of coronary angioplasty implant and graft: Secondary | ICD-10-CM | POA: Diagnosis not present

## 2014-08-07 DIAGNOSIS — Z951 Presence of aortocoronary bypass graft: Secondary | ICD-10-CM

## 2014-08-07 DIAGNOSIS — Z9861 Coronary angioplasty status: Secondary | ICD-10-CM

## 2014-08-07 NOTE — Progress Notes (Signed)
Daily Session Note  Patient Details  Name: Scott [name] MRN: [id number] Date of Birth: 1941/01/11 Referring Provider:  Madelyn Brunner, MD  Encounter Date: 08/07/2014  Check In:     Session Check In - 08/07/14 0904    Check-In   Staff Present Heath Lark RN, BSN, CCRP;Carroll Enterkin RN, Drusilla Kanner MS, ACSM CEP Exercise Physiologist   ER physicians immediately available to respond to emergencies See telemetry face sheet for immediately available ER MD   Medication changes reported     No   Fall or balance concerns reported    No   Warm-up and Cool-down Performed on first and last piece of equipment   VAD Patient? No   Pain Assessment   Currently in Pain? No/denies         Goals Met:  Exercise tolerated well Personal goals reviewed No report of cardiac concerns or symptoms Strength training completed today  Goals Unmet:  Not Applicable  Goals Comments: Scott [name] is doing well with the program. Is making sure he does maintain the weight limits per MD instructions.    Dr. Emily Filbert is Medical Director for [place] and [facility].

## 2014-08-10 ENCOUNTER — Encounter: Payer: Medicare Other | Admitting: *Deleted

## 2014-08-10 ENCOUNTER — Ambulatory Visit: Payer: Medicare Other

## 2014-08-10 DIAGNOSIS — Z955 Presence of coronary angioplasty implant and graft: Secondary | ICD-10-CM | POA: Diagnosis not present

## 2014-08-10 DIAGNOSIS — Z951 Presence of aortocoronary bypass graft: Secondary | ICD-10-CM

## 2014-08-10 DIAGNOSIS — Z9861 Coronary angioplasty status: Secondary | ICD-10-CM

## 2014-08-10 NOTE — Progress Notes (Signed)
Daily Session Note  Patient Details  Name: Scott [name] MRN: [id number] Date of Birth: [birth year] Referring Provider:  Madelyn Brunner, MD  Encounter Date: 08/10/2014  Check In:     Session Check In - 08/10/14 1052    Check-In   Staff Present Heath Lark RN, BSN, CCRP;Kelly Hayes BS, ACSM CEP Exercise Physiologist;Renee [place] MS, ACSM CEP Exercise Physiologist   ER physicians immediately available to respond to emergencies See telemetry face sheet for immediately available ER MD   Medication changes reported     No   Fall or balance concerns reported    No   Warm-up and Cool-down Performed on first and last piece of equipment   VAD Patient? No   Pain Assessment   Currently in Pain? No/denies           Exercise Prescription Changes - 08/10/14 1100    Response to Exercise   Blood Pressure (Admit) 126/60 mmHg   Blood Pressure (Exercise) 144/68 mmHg   Blood Pressure (Exit) 110/70 mmHg   Heart Rate (Admit) 69 bpm   Heart Rate (Exercise) 110 bpm   Heart Rate (Exit) 58 bpm   Rating of Perceived Exertion (Exercise) 12   Duration Progress to 30 minutes of continuous aerobic without signs/symptoms of physical distress   Intensity Rest + 30   Progression Continue progressive overload as per policy without signs/symptoms or physical distress.   Resistance Training   Training Prescription Yes   Weight 2   Reps 10-12   Treadmill   MPH 3   Grade 0   Minutes 15   Arm Ergometer   Level 1   Watts 10   Minutes 10      Goals Met:  Exercise tolerated well No report of cardiac concerns or symptoms Strength training completed today  Goals Unmet:  Not Applicable  Goals Comments: [name] has concerns about using his arms for any activity.  He is over 7 weeks post CABG and does not see the surgeon until September.  Started [name] on the Arm ergometer Level 1 10 watts and suggested he do 30 seconds then rest 30 seconds and continue the pattern for 5-10 minutes as long as he  was not having pain in his arms or chest muscles. Also advised [name] to start using his arms lightly to get out of the chair, but not to use arms yet to pull up into the truck or tractor.  He stated understanding of my advice.    Dr. Emily Filbert is Medical Director for [place] and [facility].

## 2014-08-10 NOTE — Progress Notes (Signed)
Cardiac Individual Treatment Plan  Patient Details  Name: Scott [name] MRN: [id number] Date of Birth: 12/01/1940 Referring Provider:  Madelyn Brunner, MD  Initial Encounter Date:    Visit Diagnosis: S/P PTCA (percutaneous transluminal coronary angioplasty)  S/P CABG (coronary artery bypass graft)  Patient's Home Medications on Admission:  Current outpatient prescriptions:  .  aspirin EC 81 MG tablet, Take 1 tablet (81 mg total) by mouth daily., Disp: , Rfl:  .  atorvastatin (LIPITOR) 80 MG tablet, Take by mouth., Disp: , Rfl:  .  isosorbide mononitrate (IMDUR) 30 MG 24 hr tablet, Take 30 mg by mouth 1 day or 1 dose., Disp: , Rfl:  .  losartan (COZAAR) 50 MG tablet, Take 50 mg by mouth daily., Disp: , Rfl:  .  metoprolol succinate (TOPROL-XL) 50 MG 24 hr tablet, Take by mouth., Disp: , Rfl:  .  nitroGLYCERIN (NITROSTAT) 0.4 MG SL tablet, Place 1 tablet (0.4 mg total) under the tongue every 5 (five) minutes as needed for chest pain., Disp: 90 tablet, Rfl: 3 .  prasugrel (EFFIENT) 10 MG TABS tablet, Take 1 tablet (10 mg total) by mouth daily., Disp: 30 tablet, Rfl: 11  Past Medical History: Past Medical History  Diagnosis Date  . CAD (coronary artery disease)     a. 04/2012 Cath: 3VD->Med Rx;  b. 04/2013 Cath/PCI: LAD 50p, D1 40, LCX 80p, OM2 99diff, RCA 70p, 62m 95d. Mid & distal RCA treated with 2 DES.  . Cardiomyopathy, ischemic     a. 04/2012 Echo: EF 40-45%;  b. 08/2013 Echo: EF 45-50%, mild glob HK, mod lat/post HK, diast dysfxn, mildly dil LA, mild MR, nl RVSP.  .Marland KitchenHyperlipidemia   . HTN (hypertension)   . Carotid arterial disease     a. 05/2013 Carotid U/S; bilat 40-50% ICA stenosis.    Tobacco Use: History  Smoking status  . Former Smoker -- 1.00 packs/day for 35 years  . Types: Cigarettes  . Start date: 05/02/2009  Smokeless tobacco  . Not on file    Labs: Recent Review Flowsheet Data    There is no flowsheet data to display.       Exercise Target Goals:     Exercise Program Goal: Individual exercise prescription set with THRR, safety & activity barriers. Participant demonstrates ability to understand and report RPE using BORG scale, to self-measure pulse accurately, and to acknowledge the importance of the exercise prescription.  Exercise Prescription Goal: Starting with aerobic activity 30 plus minutes a day, 3 days per week for initial exercise prescription. Provide home exercise prescription and guidelines that participant acknowledges understanding prior to discharge.  Activity Barriers & Risk Stratification:     Activity Barriers & Risk Stratification - 06/08/14 1400    Activity Barriers & Risk Stratification   Activity Barriers None   Risk Stratification Moderate      6 Minute Walk:     6 Minute Walk      06/08/14 1401       6 Minute Walk   Phase Initial     Distance 1670 feet     Walk Time 6 minutes     Resting HR 87 bpm     Resting BP 122/70 mmHg     Max Ex. HR 102 bpm     Max Ex. BP 136/62 mmHg     RPE 12        Initial Exercise Prescription:     Initial Exercise Prescription - 06/08/14 1400  Date of Initial Exercise Prescription   Date 06/08/14   Treadmill   MPH 3   Grade 0   Minutes 15   Bike   Level 0.4   Minutes 15   Recumbant Bike   Level 3   RPM 40   Watts 25   Minutes 15   NuStep   Level 3   Watts 40   Minutes 15   Arm Ergometer   Level 1   Watts 8   Minutes 10   Arm/Foot Ergometer   Level 4   Watts 15   Minutes 10   Cybex   Level 3   RPM 50   Minutes 15   Recumbant Elliptical   Level 1   RPM 40   Watts 10   Minutes 15   Elliptical   Level 1   Speed 3   Minutes 1   REL-XR   Level 3   Watts 45   Minutes 15   Prescription Details   Frequency (times per week) 3   Duration Progress to 30 minutes of continuous aerobic without signs/symptoms of physical distress   Intensity   THRR REST +  30   Ratings of Perceived Exertion 11-15   Resistance Training   Training  Prescription Yes   Weight 2   Reps 10-12      Exercise Prescription Changes:     Exercise Prescription Changes      06/16/14 1000 07/28/14 0700 08/10/14 1100       Exercise Review   Progression No  Has not started program yet; on wait list No  Has not started yet      Response to Exercise   Blood Pressure (Admit)   126/60 mmHg     Blood Pressure (Exercise)   144/68 mmHg     Blood Pressure (Exit)   110/70 mmHg     Heart Rate (Admit)   69 bpm     Heart Rate (Exercise)   110 bpm     Heart Rate (Exit)   58 bpm     Rating of Perceived Exertion (Exercise)   12     Duration   Progress to 30 minutes of continuous aerobic without signs/symptoms of physical distress     Intensity   Rest + 30     Progression   Continue progressive overload as per policy without signs/symptoms or physical distress.     Resistance Training   Training Prescription   Yes     Weight   2     Reps   10-12     Treadmill   MPH   3     Grade   0     Minutes   15     Arm Ergometer   Level   1     Watts   10     Minutes   10        Discharge Exercise Prescription (Final Exercise Prescription Changes):     Exercise Prescription Changes - 08/10/14 1100    Response to Exercise   Blood Pressure (Admit) 126/60 mmHg   Blood Pressure (Exercise) 144/68 mmHg   Blood Pressure (Exit) 110/70 mmHg   Heart Rate (Admit) 69 bpm   Heart Rate (Exercise) 110 bpm   Heart Rate (Exit) 58 bpm   Rating of Perceived Exertion (Exercise) 12   Duration Progress to 30 minutes of continuous aerobic without signs/symptoms of physical distress   Intensity Rest + 30   Progression  Continue progressive overload as per policy without signs/symptoms or physical distress.   Resistance Training   Training Prescription Yes   Weight 2   Reps 10-12   Treadmill   MPH 3   Grade 0   Minutes 15   Arm Ergometer   Level 1   Watts 10   Minutes 10      Nutrition:  Target Goals: Understanding of nutrition guidelines, daily intake of  sodium <1500mg, cholesterol <200mg, calories 30% from fat and 7% or less from saturated fats, daily to have 5 or more servings of fruits and vegetables.  Biometrics:     Pre Biometrics - 06/08/14 1400    Pre Biometrics   Height 5' 10" (1.778 m)   Weight 179 lb 4.8 oz (81.33 kg)   Waist Circumference 37.75 inches   Hip Circumference 37.75 inches   Waist to Hip Ratio 1 %   BMI (Calculated) 25.8       Nutrition Therapy Plan and Nutrition Goals:     Nutrition Therapy & Goals - 06/08/14 1458    Intervention Plan   Intervention Using nutrition plan and personal goals to gain a healthy nutrition lifestyle. Add exercise as prescribed.      Nutrition Discharge: Rate Your Plate Scores:     Rate Your Plate - 06/08/14 1458    Rate Your Plate Scores   Pre Score --  Prefers not to fill out. Met with Dietician before when he was in Cardiac Rehab before.       Nutrition Goals Re-Evaluation:   Psychosocial: Target Goals: Acknowledge presence or absence of depression, maximize coping skills, provide positive support system. Participant is able to verbalize types and ability to use techniques and skills needed for reducing stress and depression.  Initial Review & Psychosocial Screening:     Initial Psych Review & Screening - 06/08/14 1500    Initial Review   Current issues with Current Sleep Concerns   Family Dynamics   Good Support System? Yes   Screening Interventions   Interventions Encouraged to exercise      Quality of Life Scores:     Quality of Life - 06/08/14 1500    Quality of Life Scores   Health/Function Pre 21.5 %   Socioeconomic Pre 27 %   Psych/Spiritual Pre 23.07 %   Family Pre 30 %   GLOBAL Pre 24.12 %      PHQ-9:     Recent Review Flowsheet Data    Depression screen PHQ 2/9 06/08/2014   Decreased Interest 0   Down, Depressed, Hopeless 2   PHQ - 2 Score 2   Altered sleeping 3   Tired, decreased energy 2   Change in appetite 1   Feeling bad or  failure about yourself  2   Trouble concentrating 0   Moving slowly or fidgety/restless 0   Suicidal thoughts 0   PHQ-9 Score 10   Difficult doing work/chores Somewhat difficult      Psychosocial Evaluation and Intervention:     Psychosocial Evaluation - 07/29/14 1002    Psychosocial Evaluation & Interventions   Interventions Relaxation education;Encouraged to exercise with the program and follow exercise prescription;Stress management education   Comments Counselor met with Mr. Sigala today for initial psychosocial evaluation.  He is a 74 year old (tomorrow) gentleman who had bypass surgery on 6/3 and also had a stent inserted earlier this year.  He has a strong support system with a spouse of 9 years and adult children   close by as well as active involvement in his church community.  Mr Fajardo reports not sleeping well since the surgery, but believes he is getting at least 6 hours sleep per night interrupted.  He also reports his appetite has not been good since the surgery and there has been some weight loss recently as a result.  Mr. Kolton denies a history of depression or current symptoms and has no anxiety symptoms as well.  He states he has minimal stress in his life, other than being bored with the lifting restrictions due to the open heart surgery.  He has goals to get back on track doing the things he loves, increase his stamina and strength and return to his walking for exercise schedule as soon as he is able.  Counselor recommended checking with the pharmacist before possibly trying an OTC sleep aid to help with the intermittent sleep problems.    Counselor will follow with Mr. Terlecki as needed.     Continued Psychosocial Services Needed Yes  Follow up with Mr. Viner on sleep disturbance and possible OTC sleep aids.  He will benefit from consistent exercise and all the psychoeducational and nutrition education components of this program.        Psychosocial Re-Evaluation:      Psychosocial Re-Evaluation      08/10/14 1014           Psychosocial Re-Evaluation   Comments Counselor follow up with Mr. Elliston today reporting began taking the OTC sleep aid last week and is sleeping better and longer.  Counselor commended him for taking action immediately for his improved health.            Vocational Rehabilitation: Provide vocational rehab assistance to qualifying candidates.   Vocational Rehab Evaluation & Intervention:     Vocational Rehab - 06/08/14 1451    Initial Vocational Rehab Evaluation & Intervention   Assessment shows need for Vocational Rehabilitation No      Education: Education Goals: Education classes will be provided on a weekly basis, covering required topics. Participant will state understanding/return demonstration of topics presented.  Learning Barriers/Preferences:     Learning Barriers/Preferences - 06/08/14 1448    Learning Barriers/Preferences   Learning Barriers None   Learning Preferences None      Education Topics: General Nutrition Guidelines/Fats and Fiber: -Group instruction provided by verbal, written material, models and posters to present the general guidelines for heart healthy nutrition. Gives an explanation and review of dietary fats and fiber.   Controlling Sodium/Reading Food Labels: -Group verbal and written material supporting the discussion of sodium use in heart healthy nutrition. Review and explanation with models, verbal and written materials for utilization of the food label.   Exercise Physiology & Risk Factors: - Group verbal and written instruction with models to review the exercise physiology of the cardiovascular system and associated critical values. Details cardiovascular disease risk factors and the goals associated with each risk factor.   Aerobic Exercise & Resistance Training: - Gives group verbal and written discussion on the health impact of inactivity. On the components of aerobic and  resistive training programs and the benefits of this training and how to safely progress through these programs.          Cardiac Rehab from 08/10/2014 in [facility]   Date  08/10/14   Educator  rm   Instruction Review Code  2- meets goals/outcomes      Flexibility, Balance, General Exercise Guidelines: - Provides group verbal and   written instruction on the benefits of flexibility and balance training programs. Provides general exercise guidelines with specific guidelines to those with heart or lung disease. Demonstration and skill practice provided.   Stress Management: - Provides group verbal and written instruction about the health risks of elevated stress, cause of high stress, and healthy ways to reduce stress.   Depression: - Provides group verbal and written instruction on the correlation between heart/lung disease and depressed mood, treatment options, and the stigmas associated with seeking treatment.   Anatomy & Physiology of the Heart: - Group verbal and written instruction and models provide basic cardiac anatomy and physiology, with the coronary electrical and arterial systems. Review of: AMI, Angina, Valve disease, Heart Failure, Cardiac Arrhythmia, Pacemakers, and the ICD.   Cardiac Procedures: - Group verbal and written instruction and models to describe the testing methods done to diagnose heart disease. Reviews the outcomes of the test results. Describes the treatment choices: Medical Management, Angioplasty, or Coronary Bypass Surgery.      Cardiac Rehab from 08/10/2014 in [facility]   Date  07/29/14   Educator  SB   Instruction Review Code  2- meets goals/outcomes      Cardiac Medications: - Group verbal and written instruction to review commonly prescribed medications for heart disease. Reviews the medication, class of the drug, and side effects. Includes the steps to properly store meds and maintain the prescription regimen.   Go Sex-Intimacy  & Heart Disease, Get SMART - Goal Setting: - Group verbal and written instruction through game format to discuss heart disease and the return to sexual intimacy. Provides group verbal and written material to discuss and apply goal setting through the application of the S.M.A.R.T. Method.      Cardiac Rehab from 08/10/2014 in [facility] Re[facility]   Date  07/29/14   Educator  SB   Instruction Review Code  2- meets goals/outcomes      Other Matters of the Heart: - Provides group verbal, written materials and models to describe Heart Failure, Angina, Valve Disease, and Diabetes in the realm of heart disease. Includes description of the disease process and treatment options available to the cardiac patient.   Exercise & Equipment Safety: - Individual verbal instruction and demonstration of equipment use and safety with use of the equipment.      Cardiac Rehab from 08/10/2014 in [facility] Cardiac Rehab   Date  06/08/14   Educator  C. Enterkin,RN   Instruction Review Code  1- partially meets, needs review/practice      Infection Prevention: - Provides verbal and written material to individual with discussion of infection control including proper hand washing and proper equipment cleaning during exercise session.      Cardiac Rehab from 08/10/2014 in [facility] Cardiac Rehab   Date  06/08/14   Educator  C. Enterkin,RN   Instruction Review Code  2- meets goals/outcomes      Falls Prevention: - Provides verbal and written material to individual with discussion of falls prevention and safety.      Cardiac Rehab from 08/10/2014 in [facility] Cardiac Rehab   Date  06/08/14   Educator  C. Enterkin,RN   Instruction Review Code  2- meets goals/outcomes      Diabetes: - Individual verbal and written instruction to review signs/symptoms of diabetes, desired ranges of glucose level fasting, after meals and with exercise. Advice that pre and post exercise glucose checks will be done for 3 sessions at entry of  program.  Knowledge Questionnaire Score:     Knowledge Questionnaire Score - 06/08/14 1449    Knowledge Questionnaire Score   Pre Score 20      Personal Goals and Risk Factors at Admission:     Personal Goals and Risk Factors at Admission - 08/07/14 0904    Personal Goals and Risk Factors on Admission    Weight Management No   Increase Aerobic Exercise and Physical Activity Yes   Intervention While in program, learn and follow the exercise prescription taught. Start at a low level workload and increase workload after able to maintain previous level for 30 minutes. Increase time before increasing intensity.      Personal Goals and Risk Factors Review:      Goals and Risk Factor Review      08/07/14 0905           Increase Aerobic Exercise and Physical Activity   Goals Progress/Improvement seen  Yes       Comments Jesten has started session after CABG, he is progressing with the exercise prescription without stated concerns nor complaints.           Personal Goals Discharge:     Comments: 30 day review. Continue with ITP.    

## 2014-08-11 ENCOUNTER — Ambulatory Visit: Payer: Medicare Other

## 2014-08-12 ENCOUNTER — Telehealth: Payer: Self-pay | Admitting: *Deleted

## 2014-08-12 ENCOUNTER — Ambulatory Visit: Payer: Medicare Other

## 2014-08-12 ENCOUNTER — Other Ambulatory Visit: Payer: Self-pay | Admitting: *Deleted

## 2014-08-12 DIAGNOSIS — Z9861 Coronary angioplasty status: Secondary | ICD-10-CM

## 2014-08-12 DIAGNOSIS — Z955 Presence of coronary angioplasty implant and graft: Secondary | ICD-10-CM | POA: Diagnosis not present

## 2014-08-12 DIAGNOSIS — Z951 Presence of aortocoronary bypass graft: Secondary | ICD-10-CM

## 2014-08-12 NOTE — Progress Notes (Signed)
Daily Session Note  Patient Details  Name: Scott [name] MRN: [id number] Date of Birth: [birth year] Referring Provider:  Madelyn Brunner, MD  Encounter Date: 08/12/2014  Check In:     Session Check In - 08/12/14 0845    Check-In   Staff Present Gerlene Burdock RN, BSN;Braelin Costlow Dillard Essex MS, ACSM CEP Exercise Physiologist;Steven Way BS, ACSM EP-C, Exercise Physiologist   ER physicians immediately available to respond to emergencies See telemetry face sheet for immediately available ER MD   Medication changes reported     No   Fall or balance concerns reported    No   Warm-up and Cool-down Performed on first and last piece of equipment   VAD Patient? No   Pain Assessment   Currently in Pain? No/denies   Multiple Pain Sites No         Goals Met:  Proper associated with RPD/PD & O2 Sat Independence with exercise equipment Exercise tolerated well No report of cardiac concerns or symptoms Strength training completed today  Goals Unmet:  Not Applicable  Goals Comments:   Dr. Emily Filbert is Medical Director for [facility] and [facility].

## 2014-08-12 NOTE — Telephone Encounter (Signed)
Lmom to call our office. Time to schedule a carotid u/s (1 yr).

## 2014-08-13 ENCOUNTER — Ambulatory Visit: Payer: Medicare Other

## 2014-08-13 NOTE — Telephone Encounter (Signed)
Pt states he does not need apt  He will not be calling back to schedule this apt

## 2014-08-14 ENCOUNTER — Ambulatory Visit: Payer: Medicare Other

## 2014-08-14 ENCOUNTER — Encounter: Payer: Medicare Other | Admitting: *Deleted

## 2014-08-14 DIAGNOSIS — Z951 Presence of aortocoronary bypass graft: Secondary | ICD-10-CM

## 2014-08-14 DIAGNOSIS — Z955 Presence of coronary angioplasty implant and graft: Secondary | ICD-10-CM | POA: Diagnosis not present

## 2014-08-14 DIAGNOSIS — Z9861 Coronary angioplasty status: Secondary | ICD-10-CM

## 2014-08-14 NOTE — Progress Notes (Signed)
Daily Session Note  Patient Details  Name: Scott [name] MRN: [id number] Date of Birth: 1940/08/29 Referring Provider:  Madelyn Brunner, MD  Encounter Date: 08/14/2014  Check In:     Session Check In - 08/14/14 0904    Check-In   Staff Present Heath Lark RN, BSN, CCRP;Carroll Enterkin RN, BSN  ;Hessie Knows BS Ex Phy   ER physicians immediately available to respond to emergencies See telemetry face sheet for immediately available ER MD   Medication changes reported     No   Fall or balance concerns reported    No   Warm-up and Cool-down Performed on first and last piece of equipment   VAD Patient? No   Pain Assessment   Currently in Pain? No/denies         Goals Met:  Independence with exercise equipment Exercise tolerated well No report of cardiac concerns or symptoms Strength training completed today  Goals Unmet:  Not Applicable  Goals Comments: [name] continues to progress with exercise prescription. He is gradually adding arm movement to his regimen.   Dr. Emily Filbert is Medical Director for [facility] and [facility].

## 2014-08-17 ENCOUNTER — Ambulatory Visit: Payer: Medicare Other

## 2014-08-17 ENCOUNTER — Encounter: Payer: Medicare Other | Admitting: *Deleted

## 2014-08-17 DIAGNOSIS — Z955 Presence of coronary angioplasty implant and graft: Secondary | ICD-10-CM | POA: Diagnosis not present

## 2014-08-17 DIAGNOSIS — Z9861 Coronary angioplasty status: Secondary | ICD-10-CM

## 2014-08-17 NOTE — Progress Notes (Signed)
Daily Session Note  Patient Details  Name: Darril A Rabago MRN: [id number] Date of Birth: 10/01/1940 Referring Provider:  Gehrig, Thomas Richard,*  Encounter Date: 08/17/2014  Check In:     Session Check In - 08/17/14 0905    Check-In   Staff Present   MS, ACSM CEP Exercise Physiologist;Kelly Hayes BS, ACSM CEP Exercise Physiologist;Susanne Bice RN, BSN, CCRP   ER physicians immediately available to respond to emergencies See telemetry face sheet for immediately available ER MD   Medication changes reported     No   Fall or balance concerns reported    No   Warm-up and Cool-down Performed on first and last piece of equipment   VAD Patient? No   Pain Assessment   Currently in Pain? No/denies   Multiple Pain Sites No         Goals Met:  Independence with exercise equipment Exercise tolerated well No report of cardiac concerns or symptoms Strength training completed today  Goals Unmet:  Not Applicable  Goals Comments:    Dr. Mark Miller is Medical Director for [facility] and [facility]. 

## 2014-08-18 ENCOUNTER — Ambulatory Visit: Payer: Medicare Other

## 2014-08-19 ENCOUNTER — Ambulatory Visit: Payer: Medicare Other

## 2014-08-19 DIAGNOSIS — Z955 Presence of coronary angioplasty implant and graft: Secondary | ICD-10-CM | POA: Diagnosis not present

## 2014-08-19 DIAGNOSIS — Z9861 Coronary angioplasty status: Secondary | ICD-10-CM

## 2014-08-19 NOTE — Progress Notes (Signed)
Daily Session Note  Patient Details  Name: CLEMENTS TORO MRN: [id number] Date of Birth: [birth year] Referring Provider:  Mikle Bosworth,*  Encounter Date: 08/19/2014  Check In:     Session Check In - 08/19/14 0940    Check-In   Staff Present Heath Lark RN, BSN, CCRP;Kanya Potteiger BS, ACSM EP-C, Exercise Physiologist;Renee Dillard Essex MS, ACSM CEP Exercise Physiologist   ER physicians immediately available to respond to emergencies See telemetry face sheet for immediately available ER MD   Medication changes reported     No   Fall or balance concerns reported    No   Warm-up and Cool-down Performed on first and last piece of equipment   VAD Patient? No   Pain Assessment   Currently in Pain? No/denies         Goals Met:  Proper associated with RPD/PD & O2 Sat Exercise tolerated well No report of cardiac concerns or symptoms Strength training completed today  Goals Unmet:  Not Applicable  Goals Comments: Jaris performed all exercise well, continues to notice steady gains.   Dr. Emily Filbert is Medical Director for [place] and [facility].

## 2014-08-20 ENCOUNTER — Ambulatory Visit: Payer: Medicare Other

## 2014-08-21 ENCOUNTER — Ambulatory Visit: Payer: Medicare Other

## 2014-08-21 DIAGNOSIS — Z955 Presence of coronary angioplasty implant and graft: Secondary | ICD-10-CM | POA: Diagnosis not present

## 2014-08-21 DIAGNOSIS — Z951 Presence of aortocoronary bypass graft: Secondary | ICD-10-CM

## 2014-08-21 NOTE — Progress Notes (Signed)
Daily Session Note  Patient Details  Name: Scott [name] MRN: [id number] Date of Birth: 11/13/1940 Referring Provider:  Mikle Bosworth,*  Encounter Date: 08/21/2014  Check In:     Session Check In - 08/21/14 0842    Check-In   Staff Present Heath Lark RN, BSN, CCRP;Carroll Enterkin RN, BSN;Other   ER physicians immediately available to respond to emergencies See telemetry face sheet for immediately available ER MD   Medication changes reported     Yes   Comments see med list   Fall or balance concerns reported    No   Warm-up and Cool-down Performed on first and last piece of equipment   VAD Patient? No   Pain Assessment   Currently in Pain? No/denies         Goals Met:  Independence with exercise equipment Exercise tolerated well Personal goals reviewed No report of cardiac concerns or symptoms Strength training completed today  Goals Unmet:  Not Applicable  Goals Comments: Doing well this week with exercise. Slow progress as he begins to use his arms more.    Dr. Emily Filbert is Medical Director for [facility] and [facility].

## 2014-08-24 ENCOUNTER — Ambulatory Visit: Payer: Medicare Other

## 2014-08-24 ENCOUNTER — Encounter: Payer: Medicare Other | Attending: Cardiology | Admitting: *Deleted

## 2014-08-24 DIAGNOSIS — Z955 Presence of coronary angioplasty implant and graft: Secondary | ICD-10-CM | POA: Insufficient documentation

## 2014-08-24 DIAGNOSIS — Z9861 Coronary angioplasty status: Secondary | ICD-10-CM

## 2014-08-24 DIAGNOSIS — Z951 Presence of aortocoronary bypass graft: Secondary | ICD-10-CM

## 2014-08-24 NOTE — Progress Notes (Signed)
Daily Session Note  Patient Details  Name: Scott [name] MRN: [id number] Date of Birth: 19-Sep-1940 Referring Provider:  Mikle Bosworth,*  Encounter Date: 08/24/2014  Check In:     Session Check In - 08/24/14 1240    Check-In   Staff Present Heath Lark RN, BSN, CCRP;Renee Dillard Essex MS, ACSM CEP Exercise Physiologist;Other   ER physicians immediately available to respond to emergencies See telemetry face sheet for immediately available ER MD   Medication changes reported     No   Warm-up and Cool-down Performed on first and last piece of equipment   VAD Patient? No   Pain Assessment   Currently in Pain? No/denies         Goals Met:  Exercise tolerated well No report of cardiac concerns or symptoms  Goals Unmet:  Not Applicable  Goals Comments: Doing well with exercise prescription progression. Progressing with arm use in exercise routine and at home. Does have meds to use for soreness and pain.   Hessie Knows BS Ex physiologist on staff today   Dr. Emily Filbert is Medical Director for [facility].

## 2014-08-25 ENCOUNTER — Ambulatory Visit: Payer: Medicare Other

## 2014-08-26 ENCOUNTER — Ambulatory Visit: Payer: Medicare Other

## 2014-08-26 DIAGNOSIS — Z955 Presence of coronary angioplasty implant and graft: Secondary | ICD-10-CM | POA: Diagnosis not present

## 2014-08-26 DIAGNOSIS — Z951 Presence of aortocoronary bypass graft: Secondary | ICD-10-CM

## 2014-08-26 NOTE — Progress Notes (Signed)
Daily Session Note  Patient Details  Name: Scott [name] MRN: [id number] Date of Birth: 10/06/1940 Referring Provider:  Mikle Bosworth,*  Encounter Date: 08/26/2014  Check In:     Session Check In - 08/26/14 0907    Check-In   Staff Present Heath Lark RN, BSN, CCRP;Orine Goga BS, ACSM EP-C, Exercise Physiologist;Renee Dillard Essex MS, ACSM CEP Exercise Physiologist   ER physicians immediately available to respond to emergencies See telemetry face sheet for immediately available ER MD   Medication changes reported     No   Fall or balance concerns reported    No   Warm-up and Cool-down Performed on first and last piece of equipment   VAD Patient? No   Pain Assessment   Currently in Pain? No/denies         Goals Met:  Proper associated with RPD/PD & O2 Sat Exercise tolerated well No report of cardiac concerns or symptoms Strength training completed today  Goals Unmet:  Not Applicable  Goals Comments: Luisdavid began interval training today, stated good understanding of the purpose and method.   Dr. Emily Filbert is Medical Director for [facility] and [facility].

## 2014-08-27 ENCOUNTER — Ambulatory Visit: Payer: Medicare Other

## 2014-08-28 ENCOUNTER — Encounter: Payer: Self-pay | Admitting: *Deleted

## 2014-08-28 ENCOUNTER — Ambulatory Visit: Payer: Medicare Other

## 2014-08-28 DIAGNOSIS — Z951 Presence of aortocoronary bypass graft: Secondary | ICD-10-CM

## 2014-08-31 ENCOUNTER — Ambulatory Visit: Payer: Medicare Other

## 2014-08-31 ENCOUNTER — Encounter: Payer: Medicare Other | Admitting: *Deleted

## 2014-08-31 DIAGNOSIS — Z955 Presence of coronary angioplasty implant and graft: Secondary | ICD-10-CM | POA: Diagnosis not present

## 2014-08-31 DIAGNOSIS — Z9861 Coronary angioplasty status: Secondary | ICD-10-CM

## 2014-08-31 NOTE — Progress Notes (Signed)
Daily Session Note  Patient Details  Name: Scott [name] MRN: [id number] Date of Birth: 08-15-1940 Referring Provider:  Mikle Bosworth,*  Encounter Date: 08/31/2014  Check In:     Session Check In - 08/31/14 0941    Check-In   Staff Present Candiss Norse MS, ACSM CEP Exercise Physiologist;Susanne Bice RN, BSN, CCRP;Dunbar Buras Alfonso Patten, ACSM CEP Exercise Physiologist   ER physicians immediately available to respond to emergencies See telemetry face sheet for immediately available ER MD   Medication changes reported     No   Fall or balance concerns reported    No   Warm-up and Cool-down Performed on first and last piece of equipment   VAD Patient? No   Pain Assessment   Currently in Pain? No/denies         Goals Met:  Independence with exercise equipment Exercise tolerated well No report of cardiac concerns or symptoms Strength training completed today  Goals Unmet:  Not Applicable  Goals Comments:    Dr. Emily Filbert is Medical Director for [place] and [facility].

## 2014-09-01 ENCOUNTER — Ambulatory Visit: Payer: Medicare Other

## 2014-09-01 ENCOUNTER — Encounter: Payer: Self-pay | Admitting: *Deleted

## 2014-09-02 ENCOUNTER — Ambulatory Visit: Payer: Medicare Other

## 2014-09-02 DIAGNOSIS — Z9861 Coronary angioplasty status: Secondary | ICD-10-CM

## 2014-09-02 DIAGNOSIS — Z955 Presence of coronary angioplasty implant and graft: Secondary | ICD-10-CM | POA: Diagnosis not present

## 2014-09-02 NOTE — Progress Notes (Signed)
Daily Session Note  Patient Details  Name: Scott [name] MRN: [id number] Date of Birth: March 24, 1940 Referring Provider:  Madelyn Brunner, MD  Encounter Date: 09/02/2014  Check In:     Session Check In - 09/02/14 0856    Check-In   Staff Present Heath Lark RN, BSN, CCRP;[name] BS, ACSM EP-C, Exercise Physiologist;Renee Dillard Essex MS, ACSM CEP Exercise Physiologist   ER physicians immediately available to respond to emergencies See telemetry face sheet for immediately available ER MD   Medication changes reported     No   Fall or balance concerns reported    No   Warm-up and Cool-down Performed on first and last piece of equipment   VAD Patient? No   Pain Assessment   Currently in Pain? No/denies         Goals Met:  Proper associated with RPD/PD & O2 Sat Exercise tolerated well No report of cardiac concerns or symptoms Strength training completed today  Goals Unmet:  Not Applicable  Goals Comments:    Dr. Emily Filbert is Medical Director for [place] [facility] and [facility].

## 2014-09-03 ENCOUNTER — Encounter: Payer: Self-pay | Admitting: *Deleted

## 2014-09-03 DIAGNOSIS — Z951 Presence of aortocoronary bypass graft: Secondary | ICD-10-CM

## 2014-09-03 DIAGNOSIS — Z9861 Coronary angioplasty status: Secondary | ICD-10-CM

## 2014-09-03 NOTE — Progress Notes (Signed)
Cardiac Individual Treatment Plan  Patient Details  Name: Scott [name] MRN: [id number] Date of Birth: 03-Feb-1940 Referring Provider:  Mikle Bosworth,*  Initial Encounter Date:  06/08/2014  Visit Diagnosis: S/P PTCA (percutaneous transluminal coronary angioplasty)  S/P CABG (coronary artery bypass graft)  Patient's Home Medications on Admission:  Current outpatient prescriptions:  .  apixaban (ELIQUIS) 5 MG TABS tablet, Take 5 mg by mouth 2 (two) times daily., Disp: , Rfl:  .  aspirin EC 81 MG tablet, Take 1 tablet (81 mg total) by mouth daily., Disp: , Rfl:  .  atorvastatin (LIPITOR) 80 MG tablet, Take by mouth., Disp: , Rfl:  .  isosorbide mononitrate (IMDUR) 30 MG 24 hr tablet, Take 30 mg by mouth 1 day or 1 dose., Disp: , Rfl:  .  losartan (COZAAR) 50 MG tablet, Take 50 mg by mouth daily., Disp: , Rfl:  .  metoprolol succinate (TOPROL-XL) 50 MG 24 hr tablet, Take by mouth., Disp: , Rfl:  .  nitroGLYCERIN (NITROSTAT) 0.4 MG SL tablet, Place 1 tablet (0.4 mg total) under the tongue every 5 (five) minutes as needed for chest pain., Disp: 90 tablet, Rfl: 3 .  prasugrel (EFFIENT) 10 MG TABS tablet, Take 1 tablet (10 mg total) by mouth daily., Disp: 30 tablet, Rfl: 11  Past Medical History: Past Medical History  Diagnosis Date  . CAD (coronary artery disease)     a. 04/2012 Cath: 3VD->Med Rx;  b. 04/2013 Cath/PCI: LAD 50p, D1 40, LCX 80p, OM2 99diff, RCA 70p, 71m 95d. Mid & distal RCA treated with 2 DES.  . Cardiomyopathy, ischemic     a. 04/2012 Echo: EF 40-45%;  b. 08/2013 Echo: EF 45-50%, mild glob HK, mod lat/post HK, diast dysfxn, mildly dil LA, mild MR, nl RVSP.  .Marland KitchenHyperlipidemia   . HTN (hypertension)   . Carotid arterial disease     a. 05/2013 Carotid U/S; bilat 40-50% ICA stenosis.    Tobacco Use: History  Smoking status  . Former Smoker -- 1.00 packs/day for 35 years  . Types: Cigarettes  . Start date: 05/02/2009  Smokeless tobacco  . Not on file     Labs: Recent Review Flowsheet Data    Labs for [facility]P Cardiac and Pulmonary Rehab Latest Ref Rng 09/04/2013 12/15/2013   Cholestrol 0-200 mg/dL 117 122   LDLCALC 0-100 mg/dL 57 56   HDL 40-60 mg/dL 44 51   Trlycerides 0-200 mg/dL 78 76       Exercise Target Goals:    Exercise Program Goal: Individual exercise prescription set with THRR, safety & activity barriers. Participant demonstrates ability to understand and report RPE using BORG scale, to self-measure pulse accurately, and to acknowledge the importance of the exercise prescription.  Exercise Prescription Goal: Starting with aerobic activity 30 plus minutes a day, 3 days per week for initial exercise prescription. Provide home exercise prescription and guidelines that participant acknowledges understanding prior to discharge.  Activity Barriers & Risk Stratification:     Activity Barriers & Risk Stratification - 06/08/14 1400    Activity Barriers & Risk Stratification   Activity Barriers None   Risk Stratification Moderate      6 Minute Walk:     6 Minute Walk      06/08/14 1401       6 Minute Walk   Phase Initial     Distance 1670 feet     Walk Time 6 minutes     Resting HR 87 bpm  Resting BP 122/70 mmHg     Max Ex. HR 102 bpm     Max Ex. BP 136/62 mmHg     RPE 12        Initial Exercise Prescription:     Initial Exercise Prescription - 06/08/14 1400    Date of Initial Exercise Prescription   Date 06/08/14   Treadmill   MPH 3   Grade 0   Minutes 15   Bike   Level 0.4   Minutes 15   Recumbant Bike   Level 3   RPM 40   Watts 25   Minutes 15   NuStep   Level 3   Watts 40   Minutes 15   Arm Ergometer   Level 1   Watts 8   Minutes 10   Arm/Foot Ergometer   Level 4   Watts 15   Minutes 10   Cybex   Level 3   RPM 50   Minutes 15   Recumbant Elliptical   Level 1   RPM 40   Watts 10   Minutes 15   Elliptical   Level 1   Speed 3   Minutes 1   REL-XR   Level 3   Watts 45    Minutes 15   Prescription Details   Frequency (times per week) 3   Duration Progress to 30 minutes of continuous aerobic without signs/symptoms of physical distress   Intensity   THRR REST +  30   Ratings of Perceived Exertion 11-15   Resistance Training   Training Prescription Yes   Weight 2   Reps 10-12      Exercise Prescription Changes:     Exercise Prescription Changes      06/16/14 1000 07/28/14 0700 08/10/14 1100 08/12/14 0800 08/26/14 0900   Exercise Review   Progression No  Has not started program yet; on wait list No  Has not started yet  Yes --   Response to Exercise   Blood Pressure (Admit)   126/60 mmHg     Blood Pressure (Exercise)   144/68 mmHg     Blood Pressure (Exit)   110/70 mmHg     Heart Rate (Admit)   69 bpm     Heart Rate (Exercise)   110 bpm     Heart Rate (Exit)   58 bpm     Rating of Perceived Exertion (Exercise)   12     Duration   Progress to 30 minutes of continuous aerobic without signs/symptoms of physical distress Progress to 30 minutes of continuous aerobic without signs/symptoms of physical distress Progress to 30 minutes of continuous aerobic without signs/symptoms of physical distress   Intensity   Rest + 30 Rest + 30 Rest + 30   Progression   Continue progressive overload as per policy without signs/symptoms or physical distress. Continue progressive overload as per policy without signs/symptoms or physical distress. Continue progressive overload as per policy without signs/symptoms or physical distress.   Resistance Training   Training Prescription   Yes Yes Yes   Weight   _0 Reps   10-12 10-12 10-12   Interval Training   Interval Training     Yes   Equipment     Arm/Foot Ergometer   Treadmill   MPH   3 3 3.5   Grade   0 0 0   Minutes   _1 Arm Ergometer   Level   1 1  1   Watts   _0 Minutes   _1 Arm/Foot Ergometer   Level    4 4   Watts    20 20   Minutes    15 15     09/01/14 0600            Exercise Review   Progression Yes       Response to Exercise   Blood Pressure (Admit) 110/73 mmHg       Blood Pressure (Exercise) 122/64 mmHg       Blood Pressure (Exit) 118/60 mmHg       Heart Rate (Admit) 67 bpm       Heart Rate (Exercise) 82 bpm       Heart Rate (Exit) 59 bpm       Rating of Perceived Exertion (Exercise) 13       Symptoms Still experiencing some chest soreness with equipment that involves arms NuStep, BioStep)       Duration Progress to 30 minutes of continuous aerobic without signs/symptoms of physical distress       Intensity Rest + 30       Progression Continue progressive overload as per policy without signs/symptoms or physical distress.       Resistance Training   Training Prescription Yes       Weight 2       Reps 10-12       Interval Training   Interval Training Yes       Equipment Arm/Foot Ergometer;Treadmill       Treadmill   MPH 3.5       Grade 5       Minutes 15       Arm Ergometer   Level 5       Watts 35       Minutes 10       Arm/Foot Ergometer   Level 4       Watts 20       Minutes 15          Discharge Exercise Prescription (Final Exercise Prescription Changes):     Exercise Prescription Changes - 09/01/14 0600    Exercise Review   Progression Yes   Response to Exercise   Blood Pressure (Admit) 110/73 mmHg   Blood Pressure (Exercise) 122/64 mmHg   Blood Pressure (Exit) 118/60 mmHg   Heart Rate (Admit) 67 bpm   Heart Rate (Exercise) 82 bpm   Heart Rate (Exit) 59 bpm   Rating of Perceived Exertion (Exercise) 13   Symptoms Still experiencing some chest soreness with equipment that involves arms NuStep, BioStep)   Duration Progress to 30 minutes of continuous aerobic without signs/symptoms of physical distress   Intensity Rest + 30   Progression Continue progressive overload as per policy without signs/symptoms or physical distress.   Resistance Training   Training Prescription Yes   Weight 2   Reps 10-12   Interval Training    Interval Training Yes   Equipment Arm/Foot Ergometer;Treadmill   Treadmill   MPH 3.5   Grade 5   Minutes 15   Arm Ergometer   Level 5   Watts 35   Minutes 10   Arm/Foot Ergometer   Level 4   Watts 20   Minutes 15      Nutrition:  Target Goals: Understanding of nutrition guidelines, daily intake of sodium <1559m, cholesterol <2071m calories 30% from fat and 7% or less from saturated  fats, daily to have 5 or more servings of fruits and vegetables.  Biometrics:     Pre Biometrics - 06/08/14 1400    Pre Biometrics   Height _0  (1.778 m)   Weight 179 lb 4.8 oz (81.33 kg)   Waist Circumference 37.75 inches   Hip Circumference 37.75 inches   Waist to Hip Ratio 1 %   BMI (Calculated) 25.8       Nutrition Therapy Plan and Nutrition Goals:     Nutrition Therapy & Goals - 08/21/14 1619    Nutrition Therapy   Diet Instructed heart healthy diet guidelines with emphasis on DASH diet principles   Drug/Food Interactions Statins/Certain Fruits   Fiber 30 grams   Whole Grain Foods 3 servings   Protein 8 ounces/day   Saturated Fats 13 max. grams   Fruits and Vegetables 5 servings/day   Personal Nutrition Goals   Personal Goal #1 To increase fruits/vegetables with goal of 5 servings per day.   Personal Goal #2 Read labels for both saturated and trans fat.      Nutrition Discharge: Rate Your Plate Scores:     Rate Your Plate - 22/63/33 5456    Rate Your Plate Scores   Pre Score --  Prefers not to fill out. Met with Dietician before when he was in Cardiac Rehab before.       Nutrition Goals Re-Evaluation:     Nutrition Goals Re-Evaluation      08/28/14 1102           Personal Goal #1 Re-Evaluation   Goal Progress Seen Yes       Comments Trying to eat more vegetables and fruits.       Personal Goal #2 Re-Evaluation   Goal Progress Seen Yes          Psychosocial: Target Goals: Acknowledge presence or absence of depression, maximize coping skills,  provide positive support system. Participant is able to verbalize types and ability to use techniques and skills needed for reducing stress and depression.  Initial Review & Psychosocial Screening:     Initial Psych Review & Screening - 06/08/14 1500    Initial Review   Current issues with Current Sleep Concerns   Family Dynamics   Good Support System? Yes   Screening Interventions   Interventions Encouraged to exercise      Quality of Life Scores:     Quality of Life - 06/08/14 1500    Quality of Life Scores   Health/Function Pre 21.5 %   Socioeconomic Pre 27 %   Psych/Spiritual Pre 23.07 %   Family Pre 30 %   GLOBAL Pre 24.12 %      PHQ-9:     Recent Review Flowsheet Data    Depression screen [facility] 2/9 06/08/2014   Decreased Interest 0   Down, Depressed, Hopeless 2   PHQ - 2 Score 2   Altered sleeping 3   Tired, decreased energy 2   Change in appetite 1   Feeling bad or failure about yourself  2   Trouble concentrating 0   Moving slowly or fidgety/restless 0   Suicidal thoughts 0   PHQ-9 Score 10   Difficult doing work/chores Somewhat difficult      Psychosocial Evaluation and Intervention:     Psychosocial Evaluation - 07/29/14 1002    Psychosocial Evaluation & Interventions   Interventions Relaxation education;Encouraged to exercise with the program and follow exercise prescription;Stress management education   Comments Counselor met with Mr.  Broadhead today for initial psychosocial evaluation.  He is a 74 year old (tomorrow) gentleman who had bypass surgery on 6/3 and also had a stent inserted earlier this year.  He has a strong support system with a spouse of 9 years and adult children close by as well as active involvement in his church community.  Mr Delpizzo reports not sleeping well since the surgery, but believes he is getting at least 6 hours sleep per night interrupted.  He also reports his appetite has not been good since the surgery and there has been some  weight loss recently as a result.  Mr. Bouch denies a history of depression or current symptoms and has no anxiety symptoms as well.  He states he has minimal stress in his life, other than being bored with the lifting restrictions due to the open heart surgery.  He has goals to get back on track doing the things he loves, increase his stamina and strength and return to his walking for exercise schedule as soon as he is able.  Counselor recommended checking with the pharmacist before possibly trying an OTC sleep aid to help with the intermittent sleep problems.    Counselor will follow with Mr. Hollabaugh as needed.     Continued Psychosocial Services Needed Yes  Follow up with Mr. Luse on sleep disturbance and possible OTC sleep aids.  He will benefit from consistent exercise and all the psychoeducational and nutrition education components of this program.        Psychosocial Re-Evaluation:     Psychosocial Re-Evaluation      08/10/14 1014           Psychosocial Re-Evaluation   Comments Counselor follow up with Mr. Schmid today reporting began taking the OTC sleep aid last week and is sleeping better and longer.  Counselor commended him for taking action immediately for his improved health.            Vocational Rehabilitation: Provide vocational rehab assistance to qualifying candidates.   Vocational Rehab Evaluation & Intervention:     Vocational Rehab - 06/08/14 1451    Initial Vocational Rehab Evaluation & Intervention   Assessment shows need for Vocational Rehabilitation No      Education: Education Goals: Education classes will be provided on a weekly basis, covering required topics. Participant will state understanding/return demonstration of topics presented.  Learning Barriers/Preferences:     Learning Barriers/Preferences - 06/08/14 1448    Learning Barriers/Preferences   Learning Barriers None   Learning Preferences None      Education Topics: General  Nutrition Guidelines/Fats and Fiber: -Group instruction provided by verbal, written material, models and posters to present the general guidelines for heart healthy nutrition. Gives an explanation and review of dietary fats and fiber.   Controlling Sodium/Reading Food Labels: -Group verbal and written material supporting the discussion of sodium use in heart healthy nutrition. Review and explanation with models, verbal and written materials for utilization of the food label.   Exercise Physiology & Risk Factors: - Group verbal and written instruction with models to review the exercise physiology of the cardiovascular system and associated critical values. Details cardiovascular disease risk factors and the goals associated with each risk factor.   Aerobic Exercise & Resistance Training: - Gives group verbal and written discussion on the health impact of inactivity. On the components of aerobic and resistive training programs and the benefits of this training and how to safely progress through these programs.  Cardiac Rehab from 08/19/2014 in [facility]   Date  08/10/14   Educator  rm   Instruction Review Code  2- meets goals/outcomes      Flexibility, Balance, General Exercise Guidelines: - Provides group verbal and written instruction on the benefits of flexibility and balance training programs. Provides general exercise guidelines with specific guidelines to those with heart or lung disease. Demonstration and skill practice provided.   Stress Management: - Provides group verbal and written instruction about the health risks of elevated stress, cause of high stress, and healthy ways to reduce stress.   Depression: - Provides group verbal and written instruction on the correlation between heart/lung disease and depressed mood, treatment options, and the stigmas associated with seeking treatment.   Anatomy & Physiology of the Heart: - Group verbal and written  instruction and models provide basic cardiac anatomy and physiology, with the coronary electrical and arterial systems. Review of: AMI, Angina, Valve disease, Heart Failure, Cardiac Arrhythmia, Pacemakers, and the ICD.   Cardiac Procedures: - Group verbal and written instruction and models to describe the testing methods done to diagnose heart disease. Reviews the outcomes of the test results. Describes the treatment choices: Medical Management, Angioplasty, or Coronary Bypass Surgery.      Cardiac Rehab from 08/19/2014 in [facility] Cardiac Rehab   Date  07/29/14   Educator  SB   Instruction Review Code  2- meets goals/outcomes      Cardiac Medications: - Group verbal and written instruction to review commonly prescribed medications for heart disease. Reviews the medication, class of the drug, and side effects. Includes the steps to properly store meds and maintain the prescription regimen.   Go Sex-Intimacy & Heart Disease, Get SMART - Goal Setting: - Group verbal and written instruction through game format to discuss heart disease and the return to sexual intimacy. Provides group verbal and written material to discuss and apply goal setting through the application of the S.M.A.R.T. Method.      Cardiac Rehab from 08/19/2014 in [facility] Cardiac Rehab   Date  07/29/14   Educator  SB   Instruction Review Code  2- meets goals/outcomes      Other Matters of the Heart: - Provides group verbal, written materials and models to describe Heart Failure, Angina, Valve Disease, and Diabetes in the realm of heart disease. Includes description of the disease process and treatment options available to the cardiac patient.   Exercise & Equipment Safety: - Individual verbal instruction and demonstration of equipment use and safety with use of the equipment.      Cardiac Rehab from 08/19/2014 in [facility] Cardiac Rehab   Date  06/08/14   Educator  C. Enterkin,RN   Instruction Review Code  1- partially meets, needs  review/practice      Infection Prevention: - Provides verbal and written material to individual with discussion of infection control including proper hand washing and proper equipment cleaning during exercise session.      Cardiac Rehab from 08/19/2014 in [facility] Cardiac Rehab   Date  06/08/14   Educator  C. Enterkin,RN   Instruction Review Code  2- meets goals/outcomes      Falls Prevention: - Provides verbal and written material to individual with discussion of falls prevention and safety.      Cardiac Rehab from 08/19/2014 in [facility] Cardiac Rehab   Date  06/08/14   Educator  C. Enterkin,RN   Instruction Review Code  2- meets goals/outcomes      Diabetes: -  Individual verbal and written instruction to review signs/symptoms of diabetes, desired ranges of glucose level fasting, after meals and with exercise. Advice that pre and post exercise glucose checks will be done for 3 sessions at entry of program.    Knowledge Questionnaire Score:     Knowledge Questionnaire Score - 06/08/14 1449    Knowledge Questionnaire Score   Pre Score 20      Personal Goals and Risk Factors at Admission:     Personal Goals and Risk Factors at Admission - 08/07/14 0904    Personal Goals and Risk Factors on Admission    Weight Management No   Increase Aerobic Exercise and Physical Activity Yes   Intervention While in program, learn and follow the exercise prescription taught. Start at a low level workload and increase workload after able to maintain previous level for 30 minutes. Increase time before increasing intensity.      Personal Goals and Risk Factors Review:      Goals and Risk Factor Review      08/07/14 0905 08/21/14 1302 08/28/14 1102       Increase Aerobic Exercise and Physical Activity   Goals Progress/Improvement seen  Yes Yes Yes     Comments Hutson has started session after CABG, he is progressing with the exercise prescription without stated concerns nor complaints.  Lucio  had limited his use of his arms. He is experiencing pain/soreness in chest as he starts using his arms. He has been encouraged to lightly use his arms to prevent stiffness in the muscles. When I asked Demont what happened to his arm with the bandaid. He said he tripped over something on his deck and fell flat on his chest and it still hurts. I would not let him exericse in Cardiac Rehab today and sent him to his Primary Care MD. [name] reported he saw his primary care MD  who did a chest xray and said his s/p CABG sternum is ok and no broken ribs.      Hypertension   Progress seen toward goals  Yes      Comments  BP within normal range      Abnormal Lipids   Progress seen towards goals  Unknown      Comments  no labs to review         Personal Goals Discharge:     Comments: 30 day review

## 2014-09-04 ENCOUNTER — Ambulatory Visit: Payer: Medicare Other

## 2014-09-07 ENCOUNTER — Ambulatory Visit: Payer: Medicare Other

## 2014-09-09 ENCOUNTER — Ambulatory Visit: Payer: Medicare Other

## 2014-09-09 DIAGNOSIS — Z9861 Coronary angioplasty status: Secondary | ICD-10-CM

## 2014-09-09 DIAGNOSIS — Z955 Presence of coronary angioplasty implant and graft: Secondary | ICD-10-CM | POA: Diagnosis not present

## 2014-09-09 NOTE — Addendum Note (Signed)
Addended by: Gerlene Burdock on: 09/09/2014 09:54 AM   Modules accepted: Orders

## 2014-09-09 NOTE — Progress Notes (Signed)
Cardiac Individual Treatment Plan  Patient Details  Name: Scott [name] MRN: [id number] Date of Birth: 10/04/1940 Referring Provider:  Mikle Bosworth,*  Initial Encounter Date:    Visit Diagnosis: S/P PTCA (percutaneous transluminal coronary angioplasty)  Patient's Home Medications on Admission:  Current outpatient prescriptions:  .  apixaban (ELIQUIS) 5 MG TABS tablet, Take 5 mg by mouth 2 (two) times daily., Disp: , Rfl:  .  aspirin EC 81 MG tablet, Take 1 tablet (81 mg total) by mouth daily., Disp: , Rfl:  .  atorvastatin (LIPITOR) 80 MG tablet, Take by mouth., Disp: , Rfl:  .  isosorbide mononitrate (IMDUR) 30 MG 24 hr tablet, Take 30 mg by mouth 1 day or 1 dose., Disp: , Rfl:  .  losartan (COZAAR) 50 MG tablet, Take 50 mg by mouth daily., Disp: , Rfl:  .  metoprolol succinate (TOPROL-XL) 50 MG 24 hr tablet, Take by mouth., Disp: , Rfl:  .  nitroGLYCERIN (NITROSTAT) 0.4 MG SL tablet, Place 1 tablet (0.4 mg total) under the tongue every 5 (five) minutes as needed for chest pain., Disp: 90 tablet, Rfl: 3 .  prasugrel (EFFIENT) 10 MG TABS tablet, Take 1 tablet (10 mg total) by mouth daily., Disp: 30 tablet, Rfl: 11  Past Medical History: Past Medical History  Diagnosis Date  . CAD (coronary artery disease)     a. 04/2012 Cath: 3VD->Med Rx;  b. 04/2013 Cath/PCI: LAD 50p, D1 40, LCX 80p, OM2 99diff, RCA 70p, 39m, 95d. Mid & distal RCA treated with 2 DES.  . Cardiomyopathy, ischemic     a. 04/2012 Echo: EF 40-45%;  b. 08/2013 Echo: EF 45-50%, mild glob HK, mod lat/post HK, diast dysfxn, mildly dil LA, mild Scott, nl RVSP.  Marland Kitchen Hyperlipidemia   . HTN (hypertension)   . Carotid arterial disease     a. 05/2013 Carotid U/S; bilat 40-50% ICA stenosis.    Tobacco Use: History  Smoking status  . Former Smoker -- 1.00 packs/day for 35 years  . Types: Cigarettes  . Start date: 05/02/2009  Smokeless tobacco  . Not on file    Labs: Recent Review Flowsheet Data    Labs for ITP Cardiac  and Pulmonary Rehab Latest Ref Rng 09/04/2013 12/15/2013   Cholestrol 0-200 mg/dL 117 122   LDLCALC 0-100 mg/dL 57 56   HDL 40-60 mg/dL 44 51   Trlycerides 0-200 mg/dL 78 76       Exercise Target Goals:    Exercise Program Goal: Individual exercise prescription set with THRR, safety & activity barriers. Participant demonstrates ability to understand and report RPE using BORG scale, to self-measure pulse accurately, and to acknowledge the importance of the exercise prescription.  Exercise Prescription Goal: Starting with aerobic activity 30 plus minutes a day, 3 days per week for initial exercise prescription. Provide home exercise prescription and guidelines that participant acknowledges understanding prior to discharge.  Activity Barriers & Risk Stratification:     Activity Barriers & Risk Stratification - 06/08/14 1400    Activity Barriers & Risk Stratification   Activity Barriers None   Risk Stratification Moderate      6 Minute Walk:     6 Minute Walk      06/08/14 1401       6 Minute Walk   Phase Initial     Distance 1670 feet     Walk Time 6 minutes     Resting HR 87 bpm     Resting BP 122/70 mmHg  Max Ex. HR 102 bpm     Max Ex. BP 136/62 mmHg     RPE 12        Initial Exercise Prescription:     Initial Exercise Prescription - 06/08/14 1400    Date of Initial Exercise Prescription   Date 06/08/14   Treadmill   MPH 3   Grade 0   Minutes 15   Bike   Level 0.4   Minutes 15   Recumbant Bike   Level 3   RPM 40   Watts 25   Minutes 15   NuStep   Level 3   Watts 40   Minutes 15   Arm Ergometer   Level 1   Watts 8   Minutes 10   Arm/Foot Ergometer   Level 4   Watts 15   Minutes 10   Cybex   Level 3   RPM 50   Minutes 15   Recumbant Elliptical   Level 1   RPM 40   Watts 10   Minutes 15   Elliptical   Level 1   Speed 3   Minutes 1   REL-XR   Level 3   Watts 45   Minutes 15   Prescription Details   Frequency (times per week)  3   Duration Progress to 30 minutes of continuous aerobic without signs/symptoms of physical distress   Intensity   THRR REST +  30   Ratings of Perceived Exertion 11-15   Resistance Training   Training Prescription Yes   Weight 2   Reps 10-12      Exercise Prescription Changes:     Exercise Prescription Changes      06/16/14 1000 07/28/14 0700 08/10/14 1100 08/12/14 0800 08/26/14 0900   Exercise Review   Progression No  Has not started program yet; on wait list No  Has not started yet  Yes --   Response to Exercise   Blood Pressure (Admit)   126/60 mmHg     Blood Pressure (Exercise)   144/68 mmHg     Blood Pressure (Exit)   110/70 mmHg     Heart Rate (Admit)   69 bpm     Heart Rate (Exercise)   110 bpm     Heart Rate (Exit)   58 bpm     Rating of Perceived Exertion (Exercise)   12     Duration   Progress to 30 minutes of continuous aerobic without signs/symptoms of physical distress Progress to 30 minutes of continuous aerobic without signs/symptoms of physical distress Progress to 30 minutes of continuous aerobic without signs/symptoms of physical distress   Intensity   Rest + 30 Rest + 30 Rest + 30   Progression   Continue progressive overload as per policy without signs/symptoms or physical distress. Continue progressive overload as per policy without signs/symptoms or physical distress. Continue progressive overload as per policy without signs/symptoms or physical distress.   Resistance Training   Training Prescription   Yes Yes Yes   Weight   '2 2 2   '$ Reps   10-12 10-12 10-12   Interval Training   Interval Training     Yes   Equipment     Arm/Foot Ergometer   Treadmill   MPH   3 3 3.5   Grade   0 0 0   Minutes   '15 15 15   '$ Arm Ergometer   Level   '1 1 1   '$ Watts   10 10  10   Minutes   10 10 10    Arm/Foot Ergometer   Level    4 4   Watts    20 20   Minutes    15 15     09/01/14 0600           Exercise Review   Progression Yes       Response to Exercise    Blood Pressure (Admit) 110/73 mmHg       Blood Pressure (Exercise) 122/64 mmHg       Blood Pressure (Exit) 118/60 mmHg       Heart Rate (Admit) 67 bpm       Heart Rate (Exercise) 82 bpm       Heart Rate (Exit) 59 bpm       Rating of Perceived Exertion (Exercise) 13       Symptoms Still experiencing some chest soreness with equipment that involves arms NuStep, BioStep)       Duration Progress to 30 minutes of continuous aerobic without signs/symptoms of physical distress       Intensity Rest + 30       Progression Continue progressive overload as per policy without signs/symptoms or physical distress.       Resistance Training   Training Prescription Yes       Weight 2       Reps 10-12       Interval Training   Interval Training Yes       Equipment Arm/Foot Ergometer;Treadmill       Treadmill   MPH 3.5       Grade 5       Minutes 15       Arm Ergometer   Level 5       Watts 35       Minutes 10       Arm/Foot Ergometer   Level 4       Watts 20       Minutes 15          Discharge Exercise Prescription (Final Exercise Prescription Changes):     Exercise Prescription Changes - 09/01/14 0600    Exercise Review   Progression Yes   Response to Exercise   Blood Pressure (Admit) 110/73 mmHg   Blood Pressure (Exercise) 122/64 mmHg   Blood Pressure (Exit) 118/60 mmHg   Heart Rate (Admit) 67 bpm   Heart Rate (Exercise) 82 bpm   Heart Rate (Exit) 59 bpm   Rating of Perceived Exertion (Exercise) 13   Symptoms Still experiencing some chest soreness with equipment that involves arms NuStep, BioStep)   Duration Progress to 30 minutes of continuous aerobic without signs/symptoms of physical distress   Intensity Rest + 30   Progression Continue progressive overload as per policy without signs/symptoms or physical distress.   Resistance Training   Training Prescription Yes   Weight 2   Reps 10-12   Interval Training   Interval Training Yes   Equipment Arm/Foot Ergometer;Treadmill    Treadmill   MPH 3.5   Grade 5   Minutes 15   Arm Ergometer   Level 5   Watts 35   Minutes 10   Arm/Foot Ergometer   Level 4   Watts 20   Minutes 15      Nutrition:  Target Goals: Understanding of nutrition guidelines, daily intake of sodium 1500mg , cholesterol 200mg , calories 30% from fat and 7% or less from saturated fats, daily to have 5 or more servings  of fruits and vegetables.  Biometrics:     Pre Biometrics - 06/08/14 1400    Pre Biometrics   Height $Remov'5\' 10"'LhcMKi$  (1.778 m)   Weight 179 lb 4.8 oz (81.33 kg)   Waist Circumference 37.75 inches   Hip Circumference 37.75 inches   Waist to Hip Ratio 1 %   BMI (Calculated) 25.8       Nutrition Therapy Plan and Nutrition Goals:     Nutrition Therapy & Goals - 08/21/14 1619    Nutrition Therapy   Diet Instructed heart healthy diet guidelines with emphasis on DASH diet principles   Drug/Food Interactions Statins/Certain Fruits   Fiber 30 grams   Whole Grain Foods 3 servings   Protein 8 ounces/day   Saturated Fats 13 max. grams   Fruits and Vegetables 5 servings/day   Personal Nutrition Goals   Personal Goal #1 To increase fruits/vegetables with goal of 5 servings per day.   Personal Goal #2 Read labels for both saturated and trans fat.      Nutrition Discharge: Rate Your Plate Scores:     Rate Your Plate - 66/06/30 1601    Rate Your Plate Scores   Pre Score --  Prefers not to fill out. Met with Dietician before when he was in Cardiac Rehab before.       Nutrition Goals Re-Evaluation:     Nutrition Goals Re-Evaluation      08/28/14 1102           Personal Goal #1 Re-Evaluation   Goal Progress Seen Yes       Comments Trying to eat more vegetables and fruits.       Personal Goal #2 Re-Evaluation   Goal Progress Seen Yes          Psychosocial: Target Goals: Acknowledge presence or absence of depression, maximize coping skills, provide positive support system. Participant is able to verbalize types  and ability to use techniques and skills needed for reducing stress and depression.  Initial Review & Psychosocial Screening:     Initial Psych Review & Screening - 06/08/14 1500    Initial Review   Current issues with Current Sleep Concerns   Family Dynamics   Good Support System? Yes   Screening Interventions   Interventions Encouraged to exercise      Quality of Life Scores:     Quality of Life - 06/08/14 1500    Quality of Life Scores   Health/Function Pre 21.5 %   Socioeconomic Pre 27 %   Psych/Spiritual Pre 23.07 %   Family Pre 30 %   GLOBAL Pre 24.12 %      PHQ-9:     Recent Review Flowsheet Data    Depression screen [facility] 2/9 06/08/2014   Decreased Interest 0   Down, Depressed, Hopeless 2   PHQ - 2 Score 2   Altered sleeping 3   Tired, decreased energy 2   Change in appetite 1   Feeling bad or failure about yourself  2   Trouble concentrating 0   Moving slowly or fidgety/restless 0   Suicidal thoughts 0   PHQ-9 Score 10   Difficult doing work/chores Somewhat difficult      Psychosocial Evaluation and Intervention:     Psychosocial Evaluation - 07/29/14 1002    Psychosocial Evaluation & Interventions   Interventions Relaxation education;Encouraged to exercise with the program and follow exercise prescription;Stress management education   Comments Counselor met with Scott [name] today for initial psychosocial evaluation.  He  is a 74 year old (tomorrow) gentleman who had bypass surgery on 6/3 and also had a stent inserted earlier this year.  He has a strong support system with a spouse of 9 years and adult children close by as well as active involvement in his church community.  Scott [name] reports not sleeping well since the surgery, but believes he is getting at least 6 hours sleep per night interrupted.  He also reports his appetite has not been good since the surgery and there has been some weight loss recently as a result.  Scott [name] denies a history of  depression or current symptoms and has no anxiety symptoms as well.  He states he has minimal stress in his life, other than being bored with the lifting restrictions due to the open heart surgery.  He has goals to get back on track doing the things he loves, increase his stamina and strength and return to his walking for exercise schedule as soon as he is able.  Counselor recommended checking with the pharmacist before possibly trying an OTC sleep aid to help with the intermittent sleep problems.    Counselor will follow with Scott [name] as needed.     Continued Psychosocial Services Needed Yes  Follow up with Scott [name] on sleep disturbance and possible OTC sleep aids.  He will benefit from consistent exercise and all the psychoeducational and nutrition education components of this program.        Psychosocial Re-Evaluation:     Psychosocial Re-Evaluation      08/10/14 1014           Psychosocial Re-Evaluation   Comments Counselor follow up with Scott [name] today reporting began taking the OTC sleep aid last week and is sleeping better and longer.  Counselor commended him for taking action immediately for his improved health.            Vocational Rehabilitation: Provide vocational rehab assistance to qualifying candidates.   Vocational Rehab Evaluation & Intervention:     Vocational Rehab - 06/08/14 1451    Initial Vocational Rehab Evaluation & Intervention   Assessment shows need for Vocational Rehabilitation No      Education: Education Goals: Education classes will be provided on a weekly basis, covering required topics. Participant will state understanding/return demonstration of topics presented.  Learning Barriers/Preferences:     Learning Barriers/Preferences - 06/08/14 1448    Learning Barriers/Preferences   Learning Barriers None   Learning Preferences None      Education Topics: General Nutrition Guidelines/Fats and Fiber: -Group instruction provided by  verbal, written material, models and posters to present the general guidelines for heart healthy nutrition. Gives an explanation and review of dietary fats and fiber.   Controlling Sodium/Reading Food Labels: -Group verbal and written material supporting the discussion of sodium use in heart healthy nutrition. Review and explanation with models, verbal and written materials for utilization of the food label.   Exercise Physiology & Risk Factors: - Group verbal and written instruction with models to review the exercise physiology of the cardiovascular system and associated critical values. Details cardiovascular disease risk factors and the goals associated with each risk factor.   Aerobic Exercise & Resistance Training: - Gives group verbal and written discussion on the health impact of inactivity. On the components of aerobic and resistive training programs and the benefits of this training and how to safely progress through these programs.          Cardiac Rehab from 08/19/2014  in [facility] Cardiac Rehab   Date  08/10/14   Educator  rm   Instruction Review Code  2- meets goals/outcomes      Flexibility, Balance, General Exercise Guidelines: - Provides group verbal and written instruction on the benefits of flexibility and balance training programs. Provides general exercise guidelines with specific guidelines to those with heart or lung disease. Demonstration and skill practice provided.   Stress Management: - Provides group verbal and written instruction about the health risks of elevated stress, cause of high stress, and healthy ways to reduce stress.   Depression: - Provides group verbal and written instruction on the correlation between heart/lung disease and depressed mood, treatment options, and the stigmas associated with seeking treatment.   Anatomy & Physiology of the Heart: - Group verbal and written instruction and models provide basic cardiac anatomy and physiology, with  the coronary electrical and arterial systems. Review of: AMI, Angina, Valve disease, Heart Failure, Cardiac Arrhythmia, Pacemakers, and the ICD.   Cardiac Procedures: - Group verbal and written instruction and models to describe the testing methods done to diagnose heart disease. Reviews the outcomes of the test results. Describes the treatment choices: Medical Management, Angioplasty, or Coronary Bypass Surgery.      Cardiac Rehab from 08/19/2014 in [facility] Cardiac Rehab   Date  07/29/14   Educator  SB   Instruction Review Code  2- meets goals/outcomes      Cardiac Medications: - Group verbal and written instruction to review commonly prescribed medications for heart disease. Reviews the medication, class of the drug, and side effects. Includes the steps to properly store meds and maintain the prescription regimen.   Go Sex-Intimacy & Heart Disease, Get SMART - Goal Setting: - Group verbal and written instruction through game format to discuss heart disease and the return to sexual intimacy. Provides group verbal and written material to discuss and apply goal setting through the application of the S.M.A.R.T. Method.      Cardiac Rehab from 08/19/2014 in [facility] Card[facility] Rehab   Date  07/29/14   Educator  SB   Instruction Review Code  2- meets goals/outcomes      Other Matters of the Heart: - Provides group verbal, written materials and models to describe Heart Failure, Angina, Valve Disease, and Diabetes in the realm of heart disease. Includes description of the disease process and treatment options available to the cardiac patient.   Exercise & Equipment Safety: - Individual verbal instruction and demonstration of equipment use and safety with use of the equipment.      Cardiac Rehab from 08/19/2014 in [facility] Cardiac Rehab   Date  06/08/14   Educator  C. Ruberta Holck,RN   Instruction Review Code  1- partially meets, needs review/practice      Infection Prevention: - Provides verbal and  written material to individual with discussion of infection control including proper hand washing and proper equipment cleaning during exercise session.      Cardiac Rehab from 08/19/2014 in [facility] Cardiac Rehab   Date  06/08/14   Educator  C. Enma Maeda,RN   Instruction Review Code  2- meets goals/outcomes      Falls Prevention: - Provides verbal and written material to individual with discussion of falls prevention and safety.      Cardiac Rehab from 08/19/2014 in [facility] Cardiac Rehab   Date  06/08/14   Educator  C. Tanuj Mullens,RN   Instruction Review Code  2- meets goals/outcomes      Diabetes: - Individual verbal and written  instruction to review signs/symptoms of diabetes, desired ranges of glucose level fasting, after meals and with exercise. Advice that pre and post exercise glucose checks will be done for 3 sessions at entry of program.    Knowledge Questionnaire Score:     Knowledge Questionnaire Score - 06/08/14 1449    Knowledge Questionnaire Score   Pre Score 20      Personal Goals and Risk Factors at Admission:     Personal Goals and Risk Factors at Admission - 08/07/14 0904    Personal Goals and Risk Factors on Admission    Weight Management No   Increase Aerobic Exercise and Physical Activity Yes   Intervention While in program, learn and follow the exercise prescription taught. Start at a low level workload and increase workload after able to maintain previous level for 30 minutes. Increase time before increasing intensity.      Personal Goals and Risk Factors Review:      Goals and Risk Factor Review      08/07/14 0905 08/21/14 1302 08/28/14 1102       Increase Aerobic Exercise and Physical Activity   Goals Progress/Improvement seen  Yes Yes Yes     Comments Scott [name] has started session after CABG, he is progressing with the exercise prescription without stated concerns nor complaints.  Tasean had limited his use of his arms. He is experiencing pain/soreness in  chest as he starts using his arms. He has been encouraged to lightly use his arms to prevent stiffness in the muscles. When I asked Scott [name] what happened to his arm with the bandaid. He said he tripped over something on his deck and fell flat on his chest and it still hurts. I would not let him exericse in Cardiac Rehab today and sent him to his Primary Care MD. [name] reported he saw his primary care MD  who did a chest xray and said his s/p CABG sternum is ok and no broken ribs.      Hypertension   Progress seen toward goals  Yes      Comments  BP within normal range      Abnormal Lipids   Progress seen towards goals  Unknown      Comments  no labs to review         Personal Goals Discharge:     Comments: Is doing well with Cardiac Rehab s/p CABG. 30 day review.

## 2014-09-09 NOTE — Progress Notes (Signed)
Daily Session Note  Patient Details  Name: Scott [name] MRN: [id number] Date of Birth: March 26, 1940 Referring Provider:  Mikle Bosworth,*  Encounter Date: 09/09/2014  Check In:     Session Check In - 09/09/14 6893    Check-In   Staff Present Nyoka Cowden RN;Earnesteen Birnie BS, ACSM EP-C, Exercise Physiologist;Other   ER physicians immediately available to respond to emergencies See telemetry face sheet for immediately available ER MD   Medication changes reported     No   Fall or balance concerns reported    No   Warm-up and Cool-down Performed on first and last piece of equipment   VAD Patient? No   Pain Assessment   Currently in Pain? No/denies         Goals Met:  Proper associated with RPD/PD & O2 Sat Exercise tolerated well No report of cardiac concerns or symptoms Strength training completed today  Goals Unmet:  Not Applicable  Goals Comments:    Dr. Emily Filbert is Medical Director for [facility].

## 2014-09-10 ENCOUNTER — Encounter: Payer: Self-pay | Admitting: *Deleted

## 2014-09-11 ENCOUNTER — Encounter: Payer: Medicare Other | Admitting: *Deleted

## 2014-09-11 DIAGNOSIS — Z9861 Coronary angioplasty status: Secondary | ICD-10-CM

## 2014-09-11 DIAGNOSIS — Z955 Presence of coronary angioplasty implant and graft: Secondary | ICD-10-CM | POA: Diagnosis not present

## 2014-09-11 DIAGNOSIS — Z951 Presence of aortocoronary bypass graft: Secondary | ICD-10-CM

## 2014-09-11 NOTE — Progress Notes (Signed)
Daily Session Note  Patient Details  Name: JUVON TEATER MRN: [id number] Date of Birth: Apr 03, 1940 Referring Provider:  Mikle Bosworth,*  Encounter Date: 09/11/2014  Check In:     Session Check In - 09/11/14 0930    Check-In   Staff Present Kyra Manges Abernethy RN;Delayni Streed RN, BSN  Hessie Knows,    ER physicians immediately available to respond to emergencies See telemetry face sheet for immediately available ER MD   Medication changes reported     No   Fall or balance concerns reported    No   Warm-up and Cool-down Performed on first and last piece of equipment   VAD Patient? No   Pain Assessment   Currently in Pain? No/denies         Goals Met:  Proper associated with RPD/PD & O2 Sat  Goals Unmet:  Was dizzy on the treadmill after about 10 minutes. Had him sit down and his blood pressure was fine.  Goals Comments: Not dizzy after that and Eliyah reports he has had some dizziness off and on for years.    Dr. Emily Filbert is Medical Director for [place] [facility].

## 2014-09-14 NOTE — Discharge Instructions (Signed)

## 2014-09-16 ENCOUNTER — Ambulatory Visit: Payer: Medicare Other | Admitting: Anesthesiology

## 2014-09-16 ENCOUNTER — Ambulatory Visit
Admission: RE | Admit: 2014-09-16 | Discharge: 2014-09-16 | Disposition: A | Discharge status: Home / Self Care | Payer: Medicare Other | Source: Ambulatory Visit | Attending: Ophthalmology | Admitting: Ophthalmology

## 2014-09-16 ENCOUNTER — Encounter: Payer: Self-pay | Admitting: Anesthesiology

## 2014-09-16 ENCOUNTER — Encounter
Admission: RE | Disposition: A | Discharge status: Home / Self Care | Payer: Self-pay | Source: Ambulatory Visit | Attending: Ophthalmology

## 2014-09-16 DIAGNOSIS — I252 Old myocardial infarction: Secondary | ICD-10-CM | POA: Diagnosis not present

## 2014-09-16 DIAGNOSIS — I2510 Atherosclerotic heart disease of native coronary artery without angina pectoris: Secondary | ICD-10-CM | POA: Diagnosis not present

## 2014-09-16 DIAGNOSIS — Z885 Allergy status to narcotic agent status: Secondary | ICD-10-CM | POA: Diagnosis not present

## 2014-09-16 DIAGNOSIS — Z91041 Radiographic dye allergy status: Secondary | ICD-10-CM | POA: Insufficient documentation

## 2014-09-16 DIAGNOSIS — E780 Pure hypercholesterolemia: Secondary | ICD-10-CM | POA: Diagnosis not present

## 2014-09-16 DIAGNOSIS — H2512 Age-related nuclear cataract, left eye: Secondary | ICD-10-CM | POA: Insufficient documentation

## 2014-09-16 HISTORY — PX: CATARACT EXTRACTION W/PHACO: SHX586

## 2014-09-16 SURGERY — PHACOEMULSIFICATION, CATARACT, WITH IOL INSERTION
Anesthesia: Monitor Anesthesia Care | Laterality: Left | Wound class: Clean

## 2014-09-16 MED ORDER — NA HYALUR & NA CHOND-NA HYALUR 0.4-0.35 ML IO KIT
PACK | INTRAOCULAR | Status: DC | PRN
  Administered 2014-09-16: 1 mL via INTRAOCULAR

## 2014-09-16 MED ORDER — FENTANYL CITRATE (PF) 100 MCG/2ML IJ SOLN
INTRAMUSCULAR | Status: DC | PRN
  Administered 2014-09-16: 50 ug via INTRAVENOUS

## 2014-09-16 MED ORDER — CEFUROXIME OPHTHALMIC INJECTION 1 MG/0.1 ML
INJECTION | OPHTHALMIC | Status: DC | PRN
  Administered 2014-09-16: 0.1 mL via INTRACAMERAL

## 2014-09-16 MED ORDER — ARMC OPHTHALMIC DILATING GEL
1.0000 | OPHTHALMIC | Status: DC | PRN
Start: 2014-09-16 — End: 2014-09-16
  Administered 2014-09-16 (×2): 1 via OPHTHALMIC

## 2014-09-16 MED ORDER — POVIDONE-IODINE 5 % OP SOLN
1.0000 "application " | OPHTHALMIC | Status: DC | PRN
  Administered 2014-09-16: 1 via OPHTHALMIC

## 2014-09-16 MED ORDER — EPINEPHRINE HCL 1 MG/ML IJ SOLN
INTRAOCULAR | Status: DC | PRN
  Administered 2014-09-16: 78 mL via OPHTHALMIC

## 2014-09-16 MED ORDER — TETRACAINE HCL 0.5 % OP SOLN
1.0000 [drp] | OPHTHALMIC | Status: DC | PRN
  Administered 2014-09-16: 1 [drp] via OPHTHALMIC

## 2014-09-16 MED ORDER — BRIMONIDINE TARTRATE 0.2 % OP SOLN
OPHTHALMIC | Status: DC | PRN
  Administered 2014-09-16: 1 [drp]

## 2014-09-16 MED ORDER — LACTATED RINGERS IV SOLN
INTRAVENOUS | Status: DC
Start: 2014-09-16 — End: 2014-09-16

## 2014-09-16 MED ORDER — MIDAZOLAM HCL 2 MG/2ML IJ SOLN
INTRAMUSCULAR | Status: DC | PRN
  Administered 2014-09-16: 2 mg via INTRAVENOUS

## 2014-09-16 MED ORDER — TIMOLOL MALEATE 0.5 % OP SOLN
OPHTHALMIC | Status: DC | PRN
  Administered 2014-09-16: 1 [drp]

## 2014-09-16 SURGICAL SUPPLY — 26 items
CANNULA ANT/CHMB 27GA (MISCELLANEOUS) ×2 IMPLANT
GLOVE SURG LX 7.5 STRW (GLOVE) ×1
GLOVE SURG LX STRL 7.5 STRW (GLOVE) ×1 IMPLANT
GLOVE SURG TRIUMPH 8.0 PF LTX (GLOVE) ×2 IMPLANT
GOWN STRL REUS W/ TWL LRG LVL3 (GOWN DISPOSABLE) ×2 IMPLANT
GOWN STRL REUS W/TWL LRG LVL3 (GOWN DISPOSABLE) ×2
LENS IOL TECNIS 20.5 (Intraocular Lens) ×2 IMPLANT
LENS IOL TECNIS MONO 1P 20.5 (Intraocular Lens) ×1 IMPLANT
MARKER SKIN SURG W/RULER VIO (MISCELLANEOUS) ×2 IMPLANT
NDL RETROBULBAR .5 NSTRL (NEEDLE) IMPLANT
NEEDLE FILTER BLUNT 18X 1/2SAF (NEEDLE) ×2
NEEDLE FILTER BLUNT 18X1 1/2 (NEEDLE) ×2 IMPLANT
PACK CATARACT BRASINGTON (MISCELLANEOUS) ×2 IMPLANT
PACK EYE AFTER SURG (MISCELLANEOUS) ×2 IMPLANT
PACK OPTHALMIC (MISCELLANEOUS) ×2 IMPLANT
RING MALYGIN 7.0 (MISCELLANEOUS) IMPLANT
SUT ETHILON 10-0 CS-B-6CS-B-6 (SUTURE)
SUT VICRYL  9 0 (SUTURE)
SUT VICRYL 9 0 (SUTURE) IMPLANT
SUTURE EHLN 10-0 CS-B-6CS-B-6 (SUTURE) IMPLANT
SYR 3ML LL SCALE MARK (SYRINGE) ×4 IMPLANT
SYR 5ML LL (SYRINGE) IMPLANT
SYR TB 1ML LUER SLIP (SYRINGE) ×2 IMPLANT
WATER STERILE IRR 250ML POUR (IV SOLUTION) ×2 IMPLANT
WATER STERILE IRR 500ML POUR (IV SOLUTION) IMPLANT
WIPE NON LINTING 3.25X3.25 (MISCELLANEOUS) ×2 IMPLANT

## 2014-09-16 NOTE — Anesthesia Preprocedure Evaluation (Signed)
Anesthesia Evaluation  Patient identified by MRN, date of birth, ID band  Reviewed: NPO status   History of Anesthesia Complications Negative for: history of anesthetic complications  Airway Mallampati: II  TM Distance: >3 FB Neck ROM: full    Dental no notable dental hx.    Pulmonary neg pulmonary ROS,    Pulmonary exam normal       Cardiovascular Exercise Tolerance: Good hypertension, + Past MI (?), + Cardiac Stents (x4 03/2014) and + CABG (06/2014) negative cardio ROS Normal cardiovascular exam+ dysrhythmias (p. AFib)     Neuro/Psych negative neurological ROS  negative psych ROS   GI/Hepatic negative GI ROS, Neg liver ROS,   Endo/Other  negative endocrine ROS  Renal/GU negative Renal ROS  negative genitourinary   Musculoskeletal   Abdominal   Peds  Hematology negative hematology ROS (+)   Anesthesia Other Findings Chest CT showed RUL 5 mm nodule, 6 mm nodular density in the LLL.  CAD s/p CABG x 2 (LIMA-LAD, SVG-OM) 06/26/2014 c/b post-op atrial fibrillation and acute blood loss anemia and cardiomyopathy (post-CABG TEE EF 45-55% with inferoseptal hypokinesis)  Last Eliquis: 8/23;  seen by cards: Janan Ridge, PA - 08/17/2014;  Reproductive/Obstetrics                             Anesthesia Physical Anesthesia Plan  ASA: III  Anesthesia Plan: MAC   Post-op Pain Management:    Induction:   Airway Management Planned:   Additional Equipment:   Intra-op Plan:   Post-operative Plan:   Informed Consent: I have reviewed the patients History and Physical, chart, labs and discussed the procedure including the risks, benefits and alternatives for the proposed anesthesia with the patient or authorized representative who has indicated his/her understanding and acceptance.     Plan Discussed with: CRNA  Anesthesia Plan Comments:         Anesthesia Quick Evaluation

## 2014-09-16 NOTE — Op Note (Signed)
OPERATIVE NOTE  ROCKO FESPERMAN [id number] 09/16/2014   PREOPERATIVE DIAGNOSIS:  Nuclear sclerotic cataract left eye. H25.12   POSTOPERATIVE DIAGNOSIS:    Nuclear sclerotic cataract left eye.     PROCEDURE:  Phacoemusification with posterior chamber intraocular lens placement of the left eye   LENS:   Implant Name Type Inv. Item Serial No. Manufacturer Lot No. LRB No. Used  LENS IMPL INTRAOC [id number] 20.5 - [id number] Intraocular Lens LENS IMPL INTRAOC ZCB00 20.5   AMO [id number] Left 1        ULTRASOUND TIME: 17  % of 1 minutes 41 seconds, CDE 17.2  SURGEON:  Wyonia Hough, MD   ANESTHESIA:  Topical with tetracaine drops and 2% Xylocaine jelly.   COMPLICATIONS:  None.   DESCRIPTION OF PROCEDURE:  The patient was identified in the holding room and transported to the operating room and placed in the supine position under the operating microscope.  The left eye was identified as the operative eye and it was prepped and draped in the usual sterile ophthalmic fashion.   A 1 millimeter clear-corneal paracentesis was made at the 1:30 position.  The anterior chamber was filled with Viscoat viscoelastic.  A 2.4 millimeter keratome was used to make a near-clear corneal incision at the 10:30 position.  .  A curvilinear capsulorrhexis was made with a cystotome and capsulorrhexis forceps.  Balanced salt solution was used to hydrodissect and hydrodelineate the nucleus.   Phacoemulsification was then used in stop and chop fashion to remove the lens nucleus and epinucleus.  The remaining cortex was then removed using the irrigation and aspiration handpiece. Provisc was then placed into the capsular bag to distend it for lens placement.  A lens was then injected into the capsular bag.  The remaining viscoelastic was aspirated.   Wounds were hydrated with balanced salt solution.  The anterior chamber was inflated to a physiologic pressure with balanced salt solution.  No wound leaks were noted.  Cefuroxime 0.1 ml of a 10mg /ml solution was injected into the anterior chamber for a dose of 1 mg of intracameral antibiotic at the completion of the case.   Timolol and Brimonidine drops were applied to the eye.  The patient was taken to the recovery room in stable condition without complications of anesthesia or surgery.  Franceska Strahm 09/16/2014, 9:15 AM

## 2014-09-16 NOTE — Transfer of Care (Signed)
Immediate Anesthesia Transfer of Care Note  Patient: Scott [name]  Procedure(s) Performed: Procedure(s): CATARACT EXTRACTION PHACO AND INTRAOCULAR LENS PLACEMENT (IOC) (Left)  Patient Location: PACU  Anesthesia Type: MAC  Level of Consciousness: awake, alert  and patient cooperative  Airway and Oxygen Therapy: Patient Spontanous Breathing and Patient connected to supplemental oxygen  Post-op Assessment: Post-op Vital signs reviewed, Patient's Cardiovascular Status Stable, Respiratory Function Stable, Patent Airway and No signs of Nausea or vomiting  Post-op Vital Signs: Reviewed and stable  Complications: No apparent anesthesia complications

## 2014-09-16 NOTE — Anesthesia Procedure Notes (Signed)
Procedure Name: MAC Performed by: Axiel Fjeld Pre-anesthesia Checklist: Patient identified, Emergency Drugs available, Suction available, Timeout performed and Patient being monitored Patient Re-evaluated:Patient Re-evaluated prior to inductionOxygen Delivery Method: Nasal cannula Placement Confirmation: positive ETCO2     

## 2014-09-16 NOTE — H&P (Signed)
  The History and Physical notes were scanned in.  The patient remains stable and unchanged from the H&P.   Previous H&P reviewed, patient examined, and there are no changes.  Scott [name] 09/16/2014 8:24 AM

## 2014-09-16 NOTE — Anesthesia Postprocedure Evaluation (Signed)
  Anesthesia Post-op Note  Patient: Scott [name]  Procedure(s) Performed: Procedure(s): CATARACT EXTRACTION PHACO AND INTRAOCULAR LENS PLACEMENT (IOC) (Left)  Anesthesia type:MAC  Patient location: PACU  Post pain: Pain level controlled  Post assessment: Post-op Vital signs reviewed, Patient's Cardiovascular Status Stable, Respiratory Function Stable, Patent Airway and No signs of Nausea or vomiting  Post vital signs: Reviewed and stable  Last Vitals:  Filed Vitals:   09/16/14 0918  BP: 152/94  Pulse: 59  Temp: 36.3 C  Resp: 16    Level of consciousness: awake, alert  and patient cooperative  Complications: No apparent anesthesia complications

## 2014-09-17 ENCOUNTER — Encounter: Payer: Self-pay | Admitting: Ophthalmology

## 2014-09-21 ENCOUNTER — Encounter: Payer: Medicare Other | Admitting: *Deleted

## 2014-09-21 DIAGNOSIS — Z9861 Coronary angioplasty status: Secondary | ICD-10-CM

## 2014-09-21 DIAGNOSIS — Z955 Presence of coronary angioplasty implant and graft: Secondary | ICD-10-CM | POA: Diagnosis not present

## 2014-09-21 NOTE — Progress Notes (Signed)
Daily Session Note  Patient Details  Name: Scott [name] MRN: [id number] Date of Birth: [birth year] Referring Provider:  Madelyn Brunner, MD  Encounter Date: 09/21/2014  Check In:     Session Check In - 09/21/14 0906    Check-In   Staff Present Candiss Norse MS, ACSM CEP Exercise Physiologist;Kelly Alfonso Patten, ACSM CEP Exercise Physiologist;Susanne Bice RN, BSN, [place]   ER physicians immediately available to respond to emergencies See telemetry face sheet for immediately available ER MD   Medication changes reported     No   Fall or balance concerns reported    No   Warm-up and Cool-down Performed on first and last piece of equipment   VAD Patient? No   Pain Assessment   Currently in Pain? No/denies   Multiple Pain Sites No         Goals Met:  Independence with exercise equipment No report of cardiac concerns or symptoms Strength training completed today  Goals Unmet:  Not Applicable  Goals Comments:   Dr. Emily Filbert is Medical Director for [place] [facility] and [facility].

## 2014-09-23 DIAGNOSIS — Z955 Presence of coronary angioplasty implant and graft: Secondary | ICD-10-CM | POA: Diagnosis not present

## 2014-09-23 DIAGNOSIS — Z951 Presence of aortocoronary bypass graft: Secondary | ICD-10-CM

## 2014-09-23 NOTE — Progress Notes (Signed)
Daily Session Note  Patient Details  Name: Scott [name] MRN: [id number] Date of Birth: Jan 02, 1941 Referring Provider:  Mikle Bosworth,*  Encounter Date: 09/23/2014  Check In:     Session Check In - 09/23/14 0858    Check-In   Staff Present Nyoka Cowden RN;Ovid Witman BS, ACSM EP-C, Exercise Physiologist;Renee Dillard [place] MS, ACSM CEP Exercise Physiologist   ER physicians immediately available to respond to emergencies See telemetry face sheet for immediately available ER MD   Medication changes reported     No   Fall or balance concerns reported    No   Warm-up and Cool-down Performed on first and last piece of equipment   VAD Patient? No   Pain Assessment   Currently in Pain? No/denies         Goals Met:  Proper associated with RPD/PD & O2 Sat Exercise tolerated well No report of cardiac concerns or symptoms Strength training completed today  Goals Unmet:  Not Applicable  Goals Comments:    Dr. Emily Filbert is Medical Director for [place] [facility].

## 2014-09-25 ENCOUNTER — Encounter: Payer: Medicare Other | Attending: Cardiology | Admitting: *Deleted

## 2014-09-25 DIAGNOSIS — Z955 Presence of coronary angioplasty implant and graft: Secondary | ICD-10-CM | POA: Insufficient documentation

## 2014-09-25 DIAGNOSIS — Z9861 Coronary angioplasty status: Secondary | ICD-10-CM

## 2014-09-25 NOTE — Progress Notes (Signed)
Daily Session Note  Patient Details  Name: Scott [name] MRN: [id number] Date of Birth: 02-Apr-1940 Referring Provider:  Mikle Bosworth,*  Encounter Date: 09/25/2014  Check In:     Session Check In - 09/25/14 0859    Check-In   Staff Present Nyoka Cowden RN;Renee Dillard [place] MS, ACSM CEP Exercise Physiologist;Anaaya Fuster RN, BSN   ER physicians immediately available to respond to emergencies See telemetry face sheet for immediately available ER MD   Medication changes reported     No   Fall or balance concerns reported    No   Warm-up and Cool-down Performed on first and last piece of equipment   Pain Assessment   Currently in Pain? No/denies         Goals Met:  Proper associated with RPD/PD & O2 Sat Exercise tolerated well No report of cardiac concerns or symptoms  Goals Unmet:  Not Applicable  Goals Comments: Doing well after his cataract surgery. Was released to return to exercise in Cardiac Rehab.    Dr. Emily Filbert is Medical Director for [place] and [facility].

## 2014-10-05 ENCOUNTER — Encounter: Payer: Medicare Other | Admitting: *Deleted

## 2014-10-05 ENCOUNTER — Encounter: Payer: Self-pay | Admitting: *Deleted

## 2014-10-05 DIAGNOSIS — Z955 Presence of coronary angioplasty implant and graft: Secondary | ICD-10-CM | POA: Diagnosis not present

## 2014-10-05 DIAGNOSIS — Z951 Presence of aortocoronary bypass graft: Secondary | ICD-10-CM

## 2014-10-05 DIAGNOSIS — Z9861 Coronary angioplasty status: Secondary | ICD-10-CM

## 2014-10-05 NOTE — Progress Notes (Signed)
Daily Session Note  Patient Details  Name: Scott [name] MRN: [id number] Date of Birth: 10-Sep-1940 Referring Provider:  Mikle Bosworth,*  Encounter Date: 10/05/2014  Check In:     Session Check In - 10/05/14 1017    Check-In   Staff Present Candiss Norse MS, ACSM CEP Exercise Physiologist;Susanne Bice RN, BSN, CCRP;Ayeza Therriault Alfonso Patten, ACSM CEP Exercise Physiologist   ER physicians immediately available to respond to emergencies See telemetry face sheet for immediately available ER MD   Medication changes reported     No   Fall or balance concerns reported    No   Warm-up and Cool-down Performed on first and last piece of equipment   VAD Patient? No   Pain Assessment   Currently in Pain? No/denies   Multiple Pain Sites No         Goals Met:  Independence with exercise equipment Exercise tolerated well No report of cardiac concerns or symptoms Strength training completed today  Goals Unmet:  Not Applicable  Goals Comments:    Dr. Emily Filbert is Medical Director for [place] [facility].

## 2014-10-06 ENCOUNTER — Encounter: Payer: Self-pay | Admitting: *Deleted

## 2014-10-06 DIAGNOSIS — Z951 Presence of aortocoronary bypass graft: Secondary | ICD-10-CM

## 2014-10-06 DIAGNOSIS — Z9861 Coronary angioplasty status: Secondary | ICD-10-CM

## 2014-10-06 NOTE — Progress Notes (Signed)
Cardiac Individual Treatment Plan  Patient Details  Name: LIBERATO STANSBERY MRN: [id number] Date of Birth: 1940-02-02 Referring Provider:  Isaias Cowman, MD  Initial Encounter Date:    Visit Diagnosis: S/P PTCA (percutaneous transluminal coronary angioplasty)  S/P CABG (coronary artery bypass graft)  Patient's Home Medications on Admission:  Current outpatient prescriptions:  .  apixaban (ELIQUIS) 5 MG TABS tablet, Take 5 mg by mouth 2 (two) times daily., Disp: , Rfl:  .  aspirin EC 81 MG tablet, Take 1 tablet (81 mg total) by mouth daily., Disp: , Rfl:  .  atorvastatin (LIPITOR) 80 MG tablet, Take by mouth. PM, Disp: , Rfl:  .  gabapentin (NEURONTIN) 300 MG capsule, Take 300 mg by mouth 2 (two) times daily. , Disp: , Rfl:  .  isosorbide mononitrate (IMDUR) 30 MG 24 hr tablet, Take 30 mg by mouth 1 day or 1 dose., Disp: , Rfl:  .  losartan (COZAAR) 50 MG tablet, Take 50 mg by mouth daily., Disp: , Rfl:  .  metoprolol succinate (TOPROL-XL) 50 MG 24 hr tablet, Take 20 mg by mouth daily. , Disp: , Rfl:  .  Multiple Vitamins-Iron (MULTIVITAMINS WITH IRON) TABS tablet, Take 1 tablet by mouth daily., Disp: , Rfl:  .  nitroGLYCERIN (NITROSTAT) 0.4 MG SL tablet, Place 1 tablet (0.4 mg total) under the tongue every 5 (five) minutes as needed for chest pain., Disp: 90 tablet, Rfl: 3 .  prasugrel (EFFIENT) 10 MG TABS tablet, Take 1 tablet (10 mg total) by mouth daily. (Patient not taking: Reported on 09/10/2014), Disp: 30 tablet, Rfl: 11  Past Medical History: Past Medical History  Diagnosis Date  . Cardiomyopathy, ischemic     a. 04/2012 Echo: EF 40-45%;  b. 08/2013 Echo: EF 45-50%, mild glob HK, mod lat/post HK, diast dysfxn, mildly dil LA, mild MR, nl RVSP.  Marland Kitchen Hyperlipidemia   . Carotid arterial disease     a. 05/2013 Carotid U/S; bilat 40-50% ICA stenosis.  Marland Kitchen CAD (coronary artery disease)     a. 04/2012 Cath: 3VD->Med Rx;  b. 04/2013 Cath/PCI: LAD 50p, D1 40, LCX 80p, OM2 99diff, RCA 70p,  71m 95d. Mid & distal RCA treated with 2 DES.    Tobacco Use: History  Smoking status  . Former Smoker -- 1.00 packs/day for 35 years  . Types: Cigarettes  . Start date: 05/02/2009  Smokeless tobacco  . Never Used    Labs: Recent Review Flowsheet Data    Labs for ITP Cardiac and Pulmonary Rehab Latest Ref Rng 09/04/2013 12/15/2013   Cholestrol 0-200 mg/dL 117 122   LDLCALC 0-100 mg/dL 57 56   HDL 40-60 mg/dL 44 51   Trlycerides 0-200 mg/dL 78 76       Exercise Target Goals:    Exercise Program Goal: Individual exercise prescription set with THRR, safety & activity barriers. Participant demonstrates ability to understand and report RPE using BORG scale, to self-measure pulse accurately, and to acknowledge the importance of the exercise prescription.  Exercise Prescription Goal: Starting with aerobic activity 30 plus minutes a day, 3 days per week for initial exercise prescription. Provide home exercise prescription and guidelines that participant acknowledges understanding prior to discharge.  Activity Barriers & Risk Stratification:     Activity Barriers & Risk Stratification - 06/08/14 1400    Activity Barriers & Risk Stratification   Activity Barriers None   Risk Stratification Moderate      6 Minute Walk:     6 Minute Walk  06/08/14 1401       6 Minute Walk   Phase Initial     Distance 1670 feet     Walk Time 6 minutes     Resting HR 87 bpm     Resting BP 122/70 mmHg     Max Ex. HR 102 bpm     Max Ex. BP 136/62 mmHg     RPE 12        Initial Exercise Prescription:     Initial Exercise Prescription - 06/08/14 1400    Date of Initial Exercise Prescription   Date 06/08/14   Treadmill   MPH 3   Grade 0   Minutes 15   Bike   Level 0.4   Minutes 15   Recumbant Bike   Level 3   RPM 40   Watts 25   Minutes 15   NuStep   Level 3   Watts 40   Minutes 15   Arm Ergometer   Level 1   Watts 8   Minutes 10   Arm/Foot Ergometer   Level 4    Watts 15   Minutes 10   Cybex   Level 3   RPM 50   Minutes 15   Recumbant Elliptical   Level 1   RPM 40   Watts 10   Minutes 15   Elliptical   Level 1   Speed 3   Minutes 1   REL-XR   Level 3   Watts 45   Minutes 15   Prescription Details   Frequency (times per week) 3   Duration Progress to 30 minutes of continuous aerobic without signs/symptoms of physical distress   Intensity   THRR REST +  30   Ratings of Perceived Exertion 11-15   Resistance Training   Training Prescription Yes   Weight 2   Reps 10-12      Exercise Prescription Changes:     Exercise Prescription Changes      06/16/14 1000 07/28/14 0700 08/10/14 1100 08/12/14 0800 08/26/14 0900   Exercise Review   Progression No  Has not started program yet; on wait list No  Has not started yet  Yes --   Response to Exercise   Blood Pressure (Admit)   126/60 mmHg     Blood Pressure (Exercise)   144/68 mmHg     Blood Pressure (Exit)   110/70 mmHg     Heart Rate (Admit)   69 bpm     Heart Rate (Exercise)   110 bpm     Heart Rate (Exit)   58 bpm     Rating of Perceived Exertion (Exercise)   12     Duration   Progress to 30 minutes of continuous aerobic without signs/symptoms of physical distress Progress to 30 minutes of continuous aerobic without signs/symptoms of physical distress Progress to 30 minutes of continuous aerobic without signs/symptoms of physical distress   Intensity   Rest + 30 Rest + 30 Rest + 30   Progression   Continue progressive overload as per policy without signs/symptoms or physical distress. Continue progressive overload as per policy without signs/symptoms or physical distress. Continue progressive overload as per policy without signs/symptoms or physical distress.   Resistance Training   Training Prescription   Yes Yes Yes   Weight   _0 Reps   10-12 10-12 10-12   Interval Training   Interval Training     Yes   Equipment  Arm/Foot Ergometer   Treadmill   MPH   3 3 3.5    Grade   0 0 0   Minutes   _0 Arm Ergometer   Level   _1 Watts   _2 Minutes   _3 Arm/Foot Ergometer   Level    4 4   Watts    20 20   Minutes    15 15     09/01/14 0600 10/06/14 0700         Exercise Review   Progression Yes Yes      Response to Exercise   Blood Pressure (Admit) 110/73 mmHg 128/60 mmHg      Blood Pressure (Exercise) 122/64 mmHg 136/64 mmHg      Blood Pressure (Exit) 118/60 mmHg 114/60 mmHg      Heart Rate (Admit) 67 bpm 69 bpm      Heart Rate (Exercise) 82 bpm 85 bpm      Heart Rate (Exit) 59 bpm 68 bpm      Rating of Perceived Exertion (Exercise) 13 13      Symptoms Still experiencing some chest soreness with equipment that involves arms NuStep, BioStep) None      Frequency  Add 1 additional day to program exercise sessions.      Duration Progress to 30 minutes of continuous aerobic without signs/symptoms of physical distress Progress to 50 minutes of aerobic without signs/symptoms of physical distress      Intensity Rest + 30 Rest + 30      Progression Continue progressive overload as per policy without signs/symptoms or physical distress. Continue progressive overload as per policy without signs/symptoms or physical distress.      Resistance Training   Training Prescription Yes Yes      Weight 2 2      Reps 10-12 10-12      Interval Training   Interval Training Yes Yes      Equipment Arm/Foot Ergometer;Treadmill Arm/Foot Ergometer;Treadmill      Treadmill   MPH 3.5 3.5      Grade 5 5      Minutes 15 15      Arm Ergometer   Level 5 5      Watts 35 35      Minutes 10 10      Arm/Foot Ergometer   Level 4 5.5      Watts 20 20      Minutes 15 15         Discharge Exercise Prescription (Final Exercise Prescription Changes):     Exercise Prescription Changes - 10/06/14 0700    Exercise Review   Progression Yes   Response to Exercise   Blood Pressure (Admit) 128/60 mmHg   Blood Pressure (Exercise) 136/64 mmHg    Blood Pressure (Exit) 114/60 mmHg   Heart Rate (Admit) 69 bpm   Heart Rate (Exercise) 85 bpm   Heart Rate (Exit) 68 bpm   Rating of Perceived Exertion (Exercise) 13   Symptoms None   Frequency Add 1 additional day to program exercise sessions.   Duration Progress to 50 minutes of aerobic without signs/symptoms of physical distress   Intensity Rest + 30   Progression Continue progressive overload as per policy without signs/symptoms or physical distress.   Resistance Training   Training Prescription Yes   Weight 2   Reps 10-12   Interval Training   Interval Training  Yes   Equipment Arm/Foot Ergometer;Treadmill   Treadmill   MPH 3.5   Grade 5   Minutes 15   Arm Ergometer   Level 5   Watts 35   Minutes 10   Arm/Foot Ergometer   Level 5.5   Watts 20   Minutes 15      Nutrition:  Target Goals: Understanding of nutrition guidelines, daily intake of sodium <1534m, cholesterol <2048m calories 30% from fat and 7% or less from saturated fats, daily to have 5 or more servings of fruits and vegetables.  Biometrics:     Pre Biometrics - 06/08/14 1400    Pre Biometrics   Height _0  (1.778 m)   Weight 179 lb 4.8 oz (81.33 kg)   Waist Circumference 37.75 inches   Hip Circumference 37.75 inches   Waist to Hip Ratio 1 %   BMI (Calculated) 25.8       Nutrition Therapy Plan and Nutrition Goals:     Nutrition Therapy & Goals - 08/21/14 1619    Nutrition Therapy   Diet Instructed heart healthy diet guidelines with emphasis on DASH diet principles   Drug/Food Interactions Statins/Certain Fruits   Fiber 30 grams   Whole Grain Foods 3 servings   Protein 8 ounces/day   Saturated Fats 13 max. grams   Fruits and Vegetables 5 servings/day   Personal Nutrition Goals   Personal Goal #1 To increase fruits/vegetables with goal of 5 servings per day.   Personal Goal #2 Read labels for both saturated and trans fat.      Nutrition Discharge: Rate Your Plate Scores:     Rate  Your Plate - 0548/88/9146945  Rate Your Plate Scores   Pre Score --  Prefers not to fill out. Met with Dietician before when he was in Cardiac Rehab before.       Nutrition Goals Re-Evaluation:     Nutrition Goals Re-Evaluation      08/28/14 1102 10/05/14 1408         Personal Goal #1 Re-Evaluation   Personal Goal #1  To increase fruits/vegetables with goal of 5 servings per day.      Goal Progress Seen Yes Yes      Comments Trying to eat more vegetables and fruits. JeAbbontinues to follow nutrition gudelines      Personal Goal #2 Re-Evaluation   Personal Goal #2  Read labels for both saturated and trans fat.      Goal Progress Seen Yes Yes      Comments  JeJanuelontinues to follow nutrition gudelines         Psychosocial: Target Goals: Acknowledge presence or absence of depression, maximize coping skills, provide positive support system. Participant is able to verbalize types and ability to use techniques and skills needed for reducing stress and depression.  Initial Review & Psychosocial Screening:     Initial Psych Review & Screening - 06/08/14 1500    Initial Review   Current issues with Current Sleep Concerns   Family Dynamics   Good Support System? Yes   Screening Interventions   Interventions Encouraged to exercise      Quality of Life Scores:     Quality of Life - 06/08/14 1500    Quality of Life Scores   Health/Function Pre 21.5 %   Socioeconomic Pre 27 %   Psych/Spiritual Pre 23.07 %   Family Pre 30 %   GLOBAL Pre 24.12 %      PHQ-9:  Recent Review Flowsheet Data    Depression screen [facility] 2/9 06/08/2014   Decreased Interest 0   Down, Depressed, Hopeless 2   PHQ - 2 Score 2   Altered sleeping 3   Tired, decreased energy 2   Change in appetite 1   Feeling bad or failure about yourself  2   Trouble concentrating 0   Moving slowly or fidgety/restless 0   Suicidal thoughts 0   PHQ-9 Score 10   Difficult doing work/chores Somewhat difficult       Psychosocial Evaluation and Intervention:     Psychosocial Evaluation - 07/29/14 1002    Psychosocial Evaluation & Interventions   Interventions Relaxation education;Encouraged to exercise with the program and follow exercise prescription;Stress management education   Comments Counselor met with Mr. Holmer today for initial psychosocial evaluation.  He is a 74 year old (tomorrow) gentleman who had bypass surgery on 6/3 and also had a stent inserted earlier this year.  He has a strong support system with a spouse of 9 years and adult children close by as well as active involvement in his church community.  Mr Parkerson reports not sleeping well since the surgery, but believes he is getting at least 6 hours sleep per night interrupted.  He also reports his appetite has not been good since the surgery and there has been some weight loss recently as a result.  Mr. Feagans denies a history of depression or current symptoms and has no anxiety symptoms as well.  He states he has minimal stress in his life, other than being bored with the lifting restrictions due to the open heart surgery.  He has goals to get back on track doing the things he loves, increase his stamina and strength and return to his walking for exercise schedule as soon as he is able.  Counselor recommended checking with the pharmacist before possibly trying an OTC sleep aid to help with the intermittent sleep problems.    Counselor will follow with Mr. Parthasarathy as needed.     Continued Psychosocial Services Needed Yes  Follow up with Mr. Guggenheim on sleep disturbance and possible OTC sleep aids.  He will benefit from consistent exercise and all the psychoeducational and nutrition education components of this program.        Psychosocial Re-Evaluation:     Psychosocial Re-Evaluation      08/10/14 1014 09/23/14 1001         Psychosocial Re-Evaluation   Comments Counselor follow up with Mr. Saxer today reporting began taking the OTC  sleep aid last week and is sleeping better and longer.  Counselor commended him for taking action immediately for his improved health.   Counselor follow up with Mr. Lento today reporting he no longer is having to take any OTC for sleep (the last 3 nights) as he is sleeping well on his own.  He also reports feeling much stronger and walked a 17 minute mile today on the treadmill - which is remarkable - with a future goal of 15 minutes in the future.  Mr. Rendell admits to feeling stronger and experiencing increased stamina since beginning this program.  Counselor congratulated him on his hard work and accomplishements in this program so far.           Vocational Rehabilitation: Provide vocational rehab assistance to qualifying candidates.   Vocational Rehab Evaluation & Intervention:     Vocational Rehab - 06/08/14 1451    Initial Vocational Rehab Evaluation & Intervention  Assessment shows need for Vocational Rehabilitation No      Education: Education Goals: Education classes will be provided on a weekly basis, covering required topics. Participant will state understanding/return demonstration of topics presented.  Learning Barriers/Preferences:     Learning Barriers/Preferences - 06/08/14 1448    Learning Barriers/Preferences   Learning Barriers None   Learning Preferences None      Education Topics: General Nutrition Guidelines/Fats and Fiber: -Group instruction provided by verbal, written material, models and posters to present the general guidelines for heart healthy nutrition. Gives an explanation and review of dietary fats and fiber.   Controlling Sodium/Reading Food Labels: -Group verbal and written material supporting the discussion of sodium use in heart healthy nutrition. Review and explanation with models, verbal and written materials for utilization of the food label.   Exercise Physiology & Risk Factors: - Group verbal and written instruction with models to  review the exercise physiology of the cardiovascular system and associated critical values. Details cardiovascular disease risk factors and the goals associated with each risk factor.   Aerobic Exercise & Resistance Training: - Gives group verbal and written discussion on the health impact of inactivity. On the components of aerobic and resistive training programs and the benefits of this training and how to safely progress through these programs.          Cardiac Rehab from 08/19/2014 in [facility] Cardiac Rehab   Date  08/10/14   Educator  rm   Instruction Review Code  2- meets goals/outcomes      Flexibility, Balance, General Exercise Guidelines: - Provides group verbal and written instruction on the benefits of flexibility and balance training programs. Provides general exercise guidelines with specific guidelines to those with heart or lung disease. Demonstration and skill practice provided.   Stress Management: - Provides group verbal and written instruction about the health risks of elevated stress, cause of high stress, and healthy ways to reduce stress.   Depression: - Provides group verbal and written instruction on the correlation between heart/lung disease and depressed mood, treatment options, and the stigmas associated with seeking treatment.   Anatomy & Physiology of the Heart: - Group verbal and written instruction and models provide basic cardiac anatomy and physiology, with the coronary electrical and arterial systems. Review of: AMI, Angina, Valve disease, Heart Failure, Cardiac Arrhythmia, Pacemakers, and the ICD.   Cardiac Procedures: - Group verbal and written instruction and models to describe the testing methods done to diagnose heart disease. Reviews the outcomes of the test results. Describes the treatment choices: Medical Management, Angioplasty, or Coronary Bypass Surgery.      Cardiac Rehab from 08/19/2014 in [facility] Cardiac Rehab   Date  07/29/14   Educator  SB    Instruction Review Code  2- meets goals/outcomes      Cardiac Medications: - Group verbal and written instruction to review commonly prescribed medications for heart disease. Reviews the medication, class of the drug, and side effects. Includes the steps to properly store meds and maintain the prescription regimen.   Go Sex-Intimacy & Heart Disease, Get SMART - Goal Setting: - Group verbal and written instruction through game format to discuss heart disease and the return to sexual intimacy. Provides group verbal and written material to discuss and apply goal setting through the application of the S.M.A.R.T. Method.      Cardiac Rehab from 08/19/2014 in [facility] Cardiac Rehab   Date  07/29/14   Educator  SB   Instruction Review Code  2-  meets goals/outcomes      Other Matters of the Heart: - Provides group verbal, written materials and models to describe Heart Failure, Angina, Valve Disease, and Diabetes in the realm of heart disease. Includes description of the disease process and treatment options available to the cardiac patient.   Exercise & Equipment Safety: - Individual verbal instruction and demonstration of equipment use and safety with use of the equipment.      Cardiac Rehab from 08/19/2014 in [facility] Cardiac Rehab   Date  06/08/14   Educator  C. Enterkin,RN   Instruction Review Code  1- partially meets, needs review/practice      Infection Prevention: - Provides verbal and written material to individual with discussion of infection control including proper hand washing and proper equipment cleaning during exercise session.      Cardiac Rehab from 08/19/2014 in [facility] Cardiac Rehab   Date  06/08/14   Educator  C. Enterkin,RN   Instruction Review Code  2- meets goals/outcomes      Falls Prevention: - Provides verbal and written material to individual with discussion of falls prevention and safety.      Cardiac Rehab from 08/19/2014 in [facility] Cardiac Rehab   Date  06/08/14    Educator  C. Enterkin,RN   Instruction Review Code  2- meets goals/outcomes      Diabetes: - Individual verbal and written instruction to review signs/symptoms of diabetes, desired ranges of glucose level fasting, after meals and with exercise. Advice that pre and post exercise glucose checks will be done for 3 sessions at entry of program.    Knowledge Questionnaire Score:     Knowledge Questionnaire Score - 06/08/14 1449    Knowledge Questionnaire Score   Pre Score 20      Personal Goals and Risk Factors at Admission:     Personal Goals and Risk Factors at Admission - 08/07/14 0904    Personal Goals and Risk Factors on Admission    Weight Management No   Increase Aerobic Exercise and Physical Activity Yes   Intervention While in program, learn and follow the exercise prescription taught. Start at a low level workload and increase workload after able to maintain previous level for 30 minutes. Increase time before increasing intensity.      Personal Goals and Risk Factors Review:      Goals and Risk Factor Review      08/07/14 0905 08/21/14 1302 08/28/14 1102 10/05/14 1409     Increase Aerobic Exercise and Physical Activity   Goals Progress/Improvement seen  Yes Yes Yes Yes    Comments Bryndon has started session after CABG, he is progressing with the exercise prescription without stated concerns nor complaints.  Obe had limited his use of his arms. He is experiencing pain/soreness in chest as he starts using his arms. He has been encouraged to lightly use his arms to prevent stiffness in the muscles. When I asked Drayson what happened to his arm with the bandaid. He said he tripped over something on his deck and fell flat on his chest and it still hurts. I would not let him exericse in Cardiac Rehab today and sent him to his Primary Care MD. [name] reported he saw his primary care MD  who did a chest xray and said his s/p CABG sternum is ok and no broken ribs.  Jacorey has been out  for eye surgery and was released to return. He continues to follow exercise prescription and progress is good.  Hypertension   Progress seen toward goals  Yes  Yes    Comments  BP within normal range  continues with good BP control    Abnormal Lipids   Progress seen towards goals  Unknown  Unknown    Comments  no labs to review  no labs to review       Personal Goals Discharge:     Comments: Doing well with exercise prescription progression. Jakaden continues to progress well with the exercise, especially since he started being able to use his arms post CABG restrictions.

## 2014-10-07 ENCOUNTER — Other Ambulatory Visit: Payer: Self-pay | Admitting: *Deleted

## 2014-10-07 DIAGNOSIS — Z951 Presence of aortocoronary bypass graft: Secondary | ICD-10-CM

## 2014-10-07 DIAGNOSIS — Z9861 Coronary angioplasty status: Secondary | ICD-10-CM

## 2014-10-09 ENCOUNTER — Encounter: Payer: Medicare Other | Admitting: *Deleted

## 2014-10-09 DIAGNOSIS — Z9861 Coronary angioplasty status: Secondary | ICD-10-CM

## 2014-10-09 DIAGNOSIS — Z955 Presence of coronary angioplasty implant and graft: Secondary | ICD-10-CM | POA: Diagnosis not present

## 2014-10-09 NOTE — Progress Notes (Signed)
Daily Session Note  Patient Details  Name: Scott [name] MRN: [id number] Date of Birth: 13-Apr-1940 Referring Provider:  Mikle Bosworth,*  Encounter Date: 10/09/2014  Check In:     Session Check In - 10/09/14 0931    Check-In   Staff Present Frederich Cha RRT, RCP Respiratory Therapist;Carroll Enterkin RN, Drusilla Kanner MS, ACSM CEP Exercise Physiologist   ER physicians immediately available to respond to emergencies See telemetry face sheet for immediately available ER MD   Medication changes reported     No   Fall or balance concerns reported    No   Warm-up and Cool-down Performed on first and last piece of equipment   VAD Patient? No   Pain Assessment   Currently in Pain? No/denies   Multiple Pain Sites No           Exercise Prescription Changes - 10/09/14 0900    Exercise Review   Progression Yes   Response to Exercise   Blood Pressure (Admit) 128/60 mmHg   Blood Pressure (Exercise) 136/64 mmHg   Blood Pressure (Exit) 114/60 mmHg   Heart Rate (Admit) 69 bpm   Heart Rate (Exercise) 85 bpm   Heart Rate (Exit) 68 bpm   Rating of Perceived Exertion (Exercise) 13   Symptoms None   Frequency Add 1 additional day to program exercise sessions.   Duration Progress to 50 minutes of aerobic without signs/symptoms of physical distress   Intensity Rest + 30   Progression Continue progressive overload as per policy without signs/symptoms or physical distress.   Resistance Training   Training Prescription Yes   Weight 3   Reps 10-12   Interval Training   Interval Training Yes   Equipment Arm/Foot Ergometer;Treadmill   Treadmill   MPH 3.5   Grade 5   Minutes 15   Arm Ergometer   Level 5   Watts 35   Minutes 10   Arm/Foot Ergometer   Level 5.5   Watts 20   Minutes 15      Goals Met:  Independence with exercise equipment Exercise tolerated well Personal goals reviewed No report of cardiac concerns or symptoms Strength training completed  today  Goals Unmet:  Not Applicable  Goals Comments: Increased [name]'s resistance training to 3 lbs. He did well with all exercise goals today.    Dr. Emily Filbert is Medical Director for [facility].

## 2014-10-12 ENCOUNTER — Encounter: Payer: Medicare Other | Admitting: *Deleted

## 2014-10-12 DIAGNOSIS — Z9861 Coronary angioplasty status: Secondary | ICD-10-CM

## 2014-10-12 DIAGNOSIS — Z955 Presence of coronary angioplasty implant and graft: Secondary | ICD-10-CM | POA: Diagnosis not present

## 2014-10-12 LAB — GLUCOSE, CAPILLARY: GLUCOSE-CAPILLARY: 105 mg/dL — AB (ref 65–99)

## 2014-10-12 NOTE — Progress Notes (Signed)
Daily Session Note  Patient Details  Name: MAEJOR ERVEN MRN: [id number] Date of Birth: 12/05/1940 Referring Provider:  Mikle Bosworth,*  Encounter Date: 10/12/2014  Check In:     Session Check In - 10/12/14 1026    Check-In   Staff Present Candiss Norse MS, ACSM CEP Exercise Physiologist;Susanne Bice RN, BSN, CCRP;Kelly Alfonso Patten, ACSM CEP Exercise Physiologist   ER physicians immediately available to respond to emergencies See telemetry face sheet for immediately available ER MD   Medication changes reported     No   Fall or balance concerns reported    No   Warm-up and Cool-down Performed on first and last piece of equipment   VAD Patient? No   Pain Assessment   Currently in Pain? Yes   Pain Location Chest   Pain Descriptors / Indicators Jabbing   Pain Type Acute pain   Pain Onset Today   Aggravating Factors  Came on during exercise, subsided during rest    Multiple Pain Sites No         Goals Met:  Independence with exercise equipment Exercise tolerated well Strength training completed today  Goals Unmet:  Experienced chest pain during exericse which subsided during rest.  Goals Comments: Vale did not feel well this morning, but thought he would try to do some exercise anyway and came to class. He reported feeling sluggish on the treadmill and more tired than usual. He moved to the arm/foot ergometer where he felt fine until sudden onset of chest pain and dizziness. He stopped exercise and rested; all vital measurements and EKG were normal. After resting several minutes and consulting with an RN, Yale felt well enough to go home.    Dr. Emily Filbert is Medical Director for [place] and [facility].

## 2014-10-14 ENCOUNTER — Encounter: Payer: Medicare Other | Admitting: *Deleted

## 2014-10-14 DIAGNOSIS — Z9861 Coronary angioplasty status: Secondary | ICD-10-CM

## 2014-10-14 DIAGNOSIS — Z955 Presence of coronary angioplasty implant and graft: Secondary | ICD-10-CM | POA: Diagnosis not present

## 2014-10-14 NOTE — Progress Notes (Signed)
Daily Session Note  Patient Details  Name: Scott [name] MRN: [id number] Date of Birth: 07/08/1940 Referring Provider:  Mikle Bosworth,*  Encounter Date: 10/14/2014  Check In:     Session Check In - 10/14/14 1004    Check-In   Staff Present Candiss Norse MS, ACSM CEP Exercise Physiologist;Steven Way BS, ACSM EP-C, Exercise Physiologist;Susanne Bice RN, BSN, [place]   ER physicians immediately available to respond to emergencies See telemetry face sheet for immediately available ER MD   Medication changes reported     No   Fall or balance concerns reported    No   Warm-up and Cool-down Performed on first and last piece of equipment   VAD Patient? No   Pain Assessment   Currently in Pain? No/denies   Multiple Pain Sites No         Goals Met:  Independence with exercise equipment Exercise tolerated well No report of cardiac concerns or symptoms Strength training completed today  Goals Unmet:  Not Applicable  Goals Comments: Saw MD and consulted about chest pain. MD said chest pain may be due to nerve pain and to continue with exercise.    Dr. Emily Filbert is Medical Director for [place] [facility].

## 2014-10-16 ENCOUNTER — Encounter: Payer: Medicare Other | Admitting: *Deleted

## 2014-10-16 DIAGNOSIS — Z9861 Coronary angioplasty status: Secondary | ICD-10-CM

## 2014-10-16 DIAGNOSIS — Z955 Presence of coronary angioplasty implant and graft: Secondary | ICD-10-CM | POA: Diagnosis not present

## 2014-10-16 NOTE — Progress Notes (Signed)
Daily Session Note  Patient Details  Name: MORRILL BOMKAMP MRN: [id number] Date of Birth: [birth year] Referring Provider:  Mikle Bosworth,*  Encounter Date: 10/16/2014  Check In:     Session Check In - 10/16/14 0902    Check-In   Staff Present Heath Lark RN, BSN, CCRP;Renee Dillard Essex MS, ACSM CEP Exercise Physiologist;Carroll Enterkin RN, BSN   ER physicians immediately available to respond to emergencies See telemetry face sheet for immediately available ER MD   Medication changes reported     No   Fall or balance concerns reported    No   Warm-up and Cool-down Performed on first and last piece of equipment   VAD Patient? No   Pain Assessment   Currently in Pain? Other (Comment)  Has recently seen MD for some chest discomfort. Given Neurotin for it and to help him sleep.          Goals Met:  Proper associated with RPD/PD & O2 Sat Exercise tolerated well No report of cardiac concerns or symptoms  Goals Unmet:  Not Applicable  Goals Comments: Neurotin Tannar reports has helped him to sleep now and for his chest pain intermittent none this am.    Dr. Emily Filbert is Medical Director for [place] [facility].

## 2014-10-19 ENCOUNTER — Encounter: Payer: Medicare Other | Admitting: *Deleted

## 2014-10-19 DIAGNOSIS — Z955 Presence of coronary angioplasty implant and graft: Secondary | ICD-10-CM

## 2014-10-19 NOTE — Progress Notes (Signed)
Daily Session Note  Patient Details  Name: Scott [name] MRN: [id number] Date of Birth: [birth year] Referring Provider:  Madelyn Brunner, MD  Encounter Date: 10/19/2014  Check In:     Session Check In - 10/19/14 0900    Check-In   Staff Present Earlean Shawl BS, ACSM CEP Exercise Physiologist;Renee Dillard Essex MS, ACSM CEP Exercise Physiologist;Susanne Bice RN, BSN, CCRP   ER physicians immediately available to respond to emergencies See telemetry face sheet for immediately available ER MD   Medication changes reported     No   Fall or balance concerns reported    No   Warm-up and Cool-down Performed on first and last piece of equipment   VAD Patient? No   Pain Assessment   Currently in Pain? No/denies         Goals Met:  Independence with exercise equipment Exercise tolerated well No report of cardiac concerns or symptoms Strength training completed today  Goals Unmet:  Not Applicable  Goals Comments:    Dr. Emily Filbert is Medical Director for [place] [facility].

## 2014-10-23 DIAGNOSIS — Z955 Presence of coronary angioplasty implant and graft: Secondary | ICD-10-CM | POA: Diagnosis not present

## 2014-10-23 DIAGNOSIS — Z951 Presence of aortocoronary bypass graft: Secondary | ICD-10-CM

## 2014-10-23 DIAGNOSIS — Z9861 Coronary angioplasty status: Secondary | ICD-10-CM

## 2014-10-23 NOTE — Progress Notes (Signed)
Daily Session Note  Patient Details  Name: Scott [name] MRN: [id number] Date of Birth: 31-May-1940 Referring Provider:  Mikle Bosworth,*  Encounter Date: 10/23/2014  Check In:     Session Check In - 10/23/14 0859    Check-In   Staff Present Candiss Norse MS, ACSM CEP Exercise Physiologist;Carroll Enterkin RN, BSN;Other   ER physicians immediately available to respond to emergencies See telemetry face sheet for immediately available ER MD   Medication changes reported     No   Fall or balance concerns reported    No   Warm-up and Cool-down Performed on first and last piece of equipment   VAD Patient? No   Pain Assessment   Currently in Pain? No/denies           Exercise Prescription Changes - 10/23/14 0900    Exercise Review   Progression Yes   Response to Exercise   Blood Pressure (Admit) 128/60 mmHg   Blood Pressure (Exercise) 136/64 mmHg   Blood Pressure (Exit) 114/60 mmHg   Heart Rate (Admit) 69 bpm   Heart Rate (Exercise) 85 bpm   Heart Rate (Exit) 68 bpm   Rating of Perceived Exertion (Exercise) 13   Symptoms None   Frequency Add 1 additional day to program exercise sessions.   Duration Progress to 50 minutes of aerobic without signs/symptoms of physical distress   Intensity Rest + 30   Progression Continue progressive overload as per policy without signs/symptoms or physical distress.   Resistance Training   Training Prescription Yes   Weight 3   Reps 10-12   Interval Training   Interval Training Yes   Equipment Arm/Foot Ergometer;Treadmill   Treadmill   MPH 3.5   Grade 5   Minutes 15   Arm Ergometer   Level 5   Watts 35   Minutes 10   Arm/Foot Ergometer   Level 6.5   Watts 30   Minutes 15      Goals Met:  Independence with exercise equipment Exercise tolerated well No report of cardiac concerns or symptoms Strength training completed today  Goals Unmet:  Not Applicable  Goals Comments: Progressing well with exercise  prescription.    Dr. Emily Filbert is Medical Director for [place] [facility].

## 2014-10-26 ENCOUNTER — Encounter: Payer: Medicare Other | Attending: Cardiology | Admitting: *Deleted

## 2014-10-26 DIAGNOSIS — Z955 Presence of coronary angioplasty implant and graft: Secondary | ICD-10-CM | POA: Insufficient documentation

## 2014-10-26 DIAGNOSIS — Z9861 Coronary angioplasty status: Secondary | ICD-10-CM

## 2014-10-26 NOTE — Progress Notes (Signed)
Daily Session Note  Patient Details  Name: Scott [name] MRN: [id number] Date of Birth: 10/28/1940 Referring Provider:  Mikle Bosworth,*  Encounter Date: 10/26/2014  Check In:     Session Check In - 10/26/14 1027    Check-In   Staff Present Candiss Norse MS, ACSM CEP Exercise Physiologist;Chriss Mannan Alfonso Patten, ACSM CEP Exercise Physiologist;Carroll Enterkin RN, BSN   ER physicians immediately available to respond to emergencies See telemetry face sheet for immediately available ER MD   Medication changes reported     No   Fall or balance concerns reported    No   Warm-up and Cool-down Performed on first and last piece of equipment   VAD Patient? No   Pain Assessment   Currently in Pain? No/denies   Multiple Pain Sites No         Goals Met:  Independence with exercise equipment Exercise tolerated well No report of cardiac concerns or symptoms Strength training completed today  Goals Unmet:  Not Applicable  Goals Comments:    Dr. Emily Filbert is Medical Director for [place] [facility].

## 2014-10-28 VITALS — Ht 70.0 in | Wt 180.0 lb

## 2014-10-28 DIAGNOSIS — Z9861 Coronary angioplasty status: Secondary | ICD-10-CM

## 2014-10-28 DIAGNOSIS — Z955 Presence of coronary angioplasty implant and graft: Secondary | ICD-10-CM | POA: Diagnosis not present

## 2014-10-28 NOTE — Progress Notes (Signed)
Daily Session Note  Patient Details  Name: Scott [name] MRN: [id number] Date of Birth: October 06, 1940 Referring Provider:  Mikle Bosworth,*  Encounter Date: 10/28/2014  Check In:     Session Check In - 10/28/14 9417    Check-In   Staff Present Nyoka Cowden RN;Renee Dillard Essex MS, ACSM CEP Exercise Physiologist;Veronique Warga BS, ACSM EP-C, Exercise Physiologist   ER physicians immediately available to respond to emergencies See telemetry face sheet for immediately available ER MD   Medication changes reported     No   Fall or balance concerns reported    No   Warm-up and Cool-down Performed on first and last piece of equipment   VAD Patient? No   Pain Assessment   Currently in Pain? No/denies           Exercise Prescription Changes - 10/28/14 0900    Exercise Review   Progression Yes   Response to Exercise   Blood Pressure (Admit) 120/68 mmHg   Blood Pressure (Exercise) 140/80 mmHg   Blood Pressure (Exit) 104/60 mmHg   Heart Rate (Admit) 86 bpm   Heart Rate (Exercise) 102 bpm   Heart Rate (Exit) 83 bpm   Rating of Perceived Exertion (Exercise) 12   Symptoms None   Comments [name] is continuing to exercise 1 day a week outside of class. He accomplishes this by walking in his neighborhood.    Frequency Add 1 additional day to program exercise sessions.   Duration Progress to 50 minutes of aerobic without signs/symptoms of physical distress   Intensity Rest + 30   Progression Continue progressive overload as per policy without signs/symptoms or physical distress.   Resistance Training   Training Prescription Yes   Weight 3   Reps 10-12   Interval Training   Interval Training Yes   Equipment Arm/Foot Ergometer;Treadmill   Treadmill   MPH 3.5   Grade 5   Minutes 15   Arm Ergometer   Level 5   Watts 35   Minutes 10   Arm/Foot Ergometer   Level 6.5   Watts 35   Minutes 15   Home Exercise Plan   Plans to continue exercise at [employer] (comment)   [employer] silver sneakers      Goals Met:  Proper associated with RPD/PD & O2 Sat Exercise tolerated well No report of cardiac concerns or symptoms Strength training completed today  Goals Unmet:  Not Applicable  Goals Comments:    Dr. Emily Filbert is Medical Director for [place] [facility].

## 2014-10-30 ENCOUNTER — Encounter: Payer: Medicare Other | Admitting: *Deleted

## 2014-10-30 DIAGNOSIS — Z955 Presence of coronary angioplasty implant and graft: Secondary | ICD-10-CM | POA: Diagnosis not present

## 2014-10-30 DIAGNOSIS — Z9861 Coronary angioplasty status: Secondary | ICD-10-CM

## 2014-10-30 NOTE — Progress Notes (Signed)
Discharge Summary  Patient Details  Name: Scott [name] MRN: [id number] Date of Birth: August 10, 1940 Referring Provider:  Mikle Bosworth,*   Number of Visits: 32  Reason for Discharge:  Patient reached a stable level of exercise. Patient independent in their exercise.  Smoking History:  History  Smoking status  . Former Smoker -- 1.00 packs/day for 35 years  . Types: Cigarettes  . Start date: 05/02/2009  Smokeless tobacco  . Never Used    Diagnosis:  S/P PTCA (percutaneous transluminal coronary angioplasty)  ADL UCSD:   Initial Exercise Prescription:     Initial Exercise Prescription - 06/08/14 1400    Date of Initial Exercise Prescription   Date 06/08/14   Treadmill   MPH 3   Grade 0   Minutes 15   Bike   Level 0.4   Minutes 15   Recumbant Bike   Level 3   RPM 40   Watts 25   Minutes 15   NuStep   Level 3   Watts 40   Minutes 15   Arm Ergometer   Level 1   Watts 8   Minutes 10   Arm/Foot Ergometer   Level 4   Watts 15   Minutes 10   Cybex   Level 3   RPM 50   Minutes 15   Recumbant Elliptical   Level 1   RPM 40   Watts 10   Minutes 15   Elliptical   Level 1   Speed 3   Minutes 1   REL-XR   Level 3   Watts 45   Minutes 15   Prescription Details   Frequency (times per week) 3   Duration Progress to 30 minutes of continuous aerobic without signs/symptoms of physical distress   Intensity   THRR REST +  30   Ratings of Perceived Exertion 11-15   Resistance Training   Training Prescription Yes   Weight 2   Reps 10-12      Discharge Exercise Prescription (Final Exercise Prescription Changes):     Exercise Prescription Changes - 10/28/14 0900    Exercise Review   Progression Yes   Response to Exercise   Blood Pressure (Admit) 120/68 mmHg   Blood Pressure (Exercise) 140/80 mmHg   Blood Pressure (Exit) 104/60 mmHg   Heart Rate (Admit) 86 bpm   Heart Rate (Exercise) 102 bpm   Heart Rate (Exit) 83 bpm   Rating of  Perceived Exertion (Exercise) 12   Symptoms None   Comments Scott [name] is continuing to exercise 1 day a week outside of class. He accomplishes this by walking in his neighborhood.    Frequency Add 1 additional day to program exercise sessions.   Duration Progress to 50 minutes of aerobic without signs/symptoms of physical distress   Intensity Rest + 30   Progression Continue progressive overload as per policy without signs/symptoms or physical distress.   Resistance Training   Training Prescription Yes   Weight 3   Reps 10-12   Interval Training   Interval Training Yes   Equipment Arm/Foot Ergometer;Treadmill   Treadmill   MPH 3.5   Grade 5   Minutes 15   Arm Ergometer   Level 5   Watts 35   Minutes 10   Arm/Foot Ergometer   Level 6.5   Watts 35   Minutes 15   Home Exercise Plan   Plans to continue exercise at [employer] (comment)  [employer] silver sneakers  Functional Capacity:     6 Minute Walk      06/08/14 1401 10/28/14 0929     6 Minute Walk   Phase Initial Discharge    Distance 1670 feet 1785 feet    Distance % Change  7 %    Walk Time 6 minutes 6 minutes    Resting HR 87 bpm 82 bpm    Resting BP 122/70 mmHg 128/68 mmHg    Max Ex. HR 102 bpm 102 bpm    Max Ex. BP 136/62 mmHg 126/66 mmHg    RPE 12 11    Perceived Dyspnea   1    Symptoms  No       Psychological, QOL, Others - Outcomes: PHQ 2/9: Depression screen PHQ 2/9 06/08/2014  Decreased Interest 0  Down, Depressed, Hopeless 2  PHQ - 2 Score 2  Altered sleeping 3  Tired, decreased energy 2  Change in appetite 1  Feeling bad or failure about yourself  2  Trouble concentrating 0  Moving slowly or fidgety/restless 0  Suicidal thoughts 0  PHQ-9 Score 10  Difficult doing work/chores Somewhat difficult    Quality of Life:     Quality of Life - 06/08/14 1500    Quality of Life Scores   Health/Function Pre 21.5 %   Socioeconomic Pre 27 %   Psych/Spiritual Pre 23.07 %   Family Pre 30 %    GLOBAL Pre 24.12 %      Personal Goals: Goals established at orientation with interventions provided to work toward goal.     Personal Goals and Risk Factors at Admission - 08/07/14 0904    Personal Goals and Risk Factors on Admission    Weight Management No   Increase Aerobic Exercise and Physical Activity Yes   Intervention While in program, learn and follow the exercise prescription taught. Start at a low level workload and increase workload after able to maintain previous level for 30 minutes. Increase time before increasing intensity.       Personal Goals Discharge:     Goals and Risk Factor Review      08/07/14 0905 08/21/14 1302 08/28/14 1102 10/05/14 1409 10/16/14 0908   Increase Aerobic Exercise and Physical Activity   Goals Progress/Improvement seen  Yes Yes Yes Yes Yes   Comments Scott [name] has started session after CABG, he is progressing with the exercise prescription without stated concerns nor complaints.  Scott [name] had limited his use of his arms. He is experiencing pain/soreness in chest as he starts using his arms. He has been encouraged to lightly use his arms to prevent stiffness in the muscles. When I asked Scott [name] what happened to his arm with the bandaid. He said he tripped over something on his deck and fell flat on his chest and it still hurts. I would not let him exericse in Cardiac Rehab today and sent him to his Primary Care MD. 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  Scott [name] has been out for eye surgery and was released to return. He continues to follow exercise prescription and progress is good. Has increased his level on Scifit especially since he is now sleeping better with Neurotin.   Hypertension   Progress seen toward goals  Yes  Yes    Comments  BP within normal range  continues with good BP control    Abnormal Lipids   Progress seen  towards goals  Unknown  Unknown    Comments  no labs  to review  no labs to review       Nutrition & Weight - Outcomes:     Pre Biometrics - 06/08/14 1400    Pre Biometrics   Height $Remov'5\' 10"'YKaXch$  (1.778 m)   Weight 179 lb 4.8 oz (81.33 kg)   Waist Circumference 37.75 inches   Hip Circumference 37.75 inches   Waist to Hip Ratio 1 %   BMI (Calculated) 25.8         Post Biometrics - 10/28/14 0930     Post  Biometrics   Height $Remov'5\' 10"'OpUwoz$  (1.778 m)   Weight 180 lb (81.647 kg)   Waist Circumference 40 inches   Hip Circumference 39 inches   Waist to Hip Ratio 1.03 %   BMI (Calculated) 25.9      Nutrition:     Nutrition Therapy & Goals - 08/21/14 1619    Nutrition Therapy   Diet Instructed heart healthy diet guidelines with emphasis on DASH diet principles   Drug/Food Interactions Statins/Certain Fruits   Fiber 30 grams   Whole Grain Foods 3 servings   Protein 8 ounces/day   Saturated Fats 13 max. grams   Fruits and Vegetables 5 servings/day   Personal Nutrition Goals   Personal Goal #1 To increase fruits/vegetables with goal of 5 servings per day.   Personal Goal #2 Read labels for both saturated and trans fat.      Nutrition Discharge:     Rate Your Plate - 66/06/30 1601    Rate Your Plate Scores   Pre Score --  Prefers not to fill out. Met with Dietician before when he was in Cardiac Rehab before.       Education Questionnaire Score:     Knowledge Questionnaire Score - 06/08/14 1449    Knowledge Questionnaire Score   Pre Score 20      Goals reviewed with patient; copy given to patient.

## 2014-10-30 NOTE — Patient Instructions (Signed)
Discharge Instructions  Patient Details  Name: Scott [name] MRN: [id number] Date of Birth: 1940-03-08 Referring Provider:  Oralia Manis,*   Number of Visits: 32  Reason for Discharge:  Patient reached a stable level of exercise. Patient independent in their exercise.  Smoking History:  History  Smoking status  . Former Smoker -- 1.00 packs/day for 35 years  . Types: Cigarettes  . Start date: 05/02/2009  Smokeless tobacco  . Never Used    Diagnosis:  S/P PTCA (percutaneous transluminal coronary angioplasty)  Initial Exercise Prescription:     Initial Exercise Prescription - 06/08/14 1400    Date of Initial Exercise Prescription   Date 06/08/14   Treadmill   MPH 3   Grade 0   Minutes 15   Bike   Level 0.4   Minutes 15   Recumbant Bike   Level 3   RPM 40   Watts 25   Minutes 15   NuStep   Level 3   Watts 40   Minutes 15   Arm Ergometer   Level 1   Watts 8   Minutes 10   Arm/Foot Ergometer   Level 4   Watts 15   Minutes 10   Cybex   Level 3   RPM 50   Minutes 15   Recumbant Elliptical   Level 1   RPM 40   Watts 10   Minutes 15   Elliptical   Level 1   Speed 3   Minutes 1   REL-XR   Level 3   Watts 45   Minutes 15   Prescription Details   Frequency (times per week) 3   Duration Progress to 30 minutes of continuous aerobic without signs/symptoms of physical distress   Intensity   THRR REST +  30   Ratings of Perceived Exertion 11-15   Resistance Training   Training Prescription Yes   Weight 2   Reps 10-12      Discharge Exercise Prescription (Final Exercise Prescription Changes):     Exercise Prescription Changes - 10/28/14 0900    Exercise Review   Progression Yes   Response to Exercise   Blood Pressure (Admit) 120/68 mmHg   Blood Pressure (Exercise) 140/80 mmHg   Blood Pressure (Exit) 104/60 mmHg   Heart Rate (Admit) 86 bpm   Heart Rate (Exercise) 102 bpm   Heart Rate (Exit) 83 bpm   Rating of Perceived Exertion  (Exercise) 12   Symptoms None   Comments Scott [name] is continuing to exercise 1 day a week outside of class. He accomplishes this by walking in his neighborhood.    Frequency Add 1 additional day to program exercise sessions.   Duration Progress to 50 minutes of aerobic without signs/symptoms of physical distress   Intensity Rest + 30   Progression Continue progressive overload as per policy without signs/symptoms or physical distress.   Resistance Training   Training Prescription Yes   Weight 3   Reps 10-12   Interval Training   Interval Training Yes   Equipment Arm/Foot Ergometer;Treadmill   Treadmill   MPH 3.5   Grade 5   Minutes 15   Arm Ergometer   Level 5   Watts 35   Minutes 10   Arm/Foot Ergometer   Level 6.5   Watts 35   Minutes 15   Home Exercise Plan   Plans to continue exercise at [employer] (comment)  [employer] silver sneakers      Functional Capacity:  6 Minute Walk      06/08/14 1401 10/28/14 0929     6 Minute Walk   Phase Initial Discharge    Distance 1670 feet 1785 feet    Distance % Change  7 %    Walk Time 6 minutes 6 minutes    Resting HR 87 bpm 82 bpm    Resting BP 122/70 mmHg 128/68 mmHg    Max Ex. HR 102 bpm 102 bpm    Max Ex. BP 136/62 mmHg 126/66 mmHg    RPE 12 11    Perceived Dyspnea   1    Symptoms  No       Quality of Life:     Quality of Life - 06/08/14 1500    Quality of Life Scores   Health/Function Pre 21.5 %   Socioeconomic Pre 27 %   Psych/Spiritual Pre 23.07 %   Family Pre 30 %   GLOBAL Pre 24.12 %      Personal Goals: Goals established at orientation with interventions provided to work toward goal.     Personal Goals and Risk Factors at Admission - 08/07/14 0904    Personal Goals and Risk Factors on Admission    Weight Management No   Increase Aerobic Exercise and Physical Activity Yes   Intervention While in program, learn and follow the exercise prescription taught. Start at a low level workload and  increase workload after able to maintain previous level for 30 minutes. Increase time before increasing intensity.       Personal Goals Discharge:     Goals and Risk Factor Review - 10/16/14 0908    Increase Aerobic Exercise and Physical Activity   Goals Progress/Improvement seen  Yes   Comments Has increased his level on Scifit especially since he is now sleeping better with Neurotin.      Nutrition & Weight - Outcomes:     Pre Biometrics - 06/08/14 1400    Pre Biometrics   Height $Remov'5\' 10"'NIuauf$  (1.778 m)   Weight 179 lb 4.8 oz (81.33 kg)   Waist Circumference 37.75 inches   Hip Circumference 37.75 inches   Waist to Hip Ratio 1 %   BMI (Calculated) 25.8         Post Biometrics - 10/28/14 0930     Post  Biometrics   Height $Remov'5\' 10"'cyymEN$  (1.778 m)   Weight 180 lb (81.647 kg)   Waist Circumference 40 inches   Hip Circumference 39 inches   Waist to Hip Ratio 1.03 %   BMI (Calculated) 25.9      Nutrition:     Nutrition Therapy & Goals - 08/21/14 1619    Nutrition Therapy   Diet Instructed heart healthy diet guidelines with emphasis on DASH diet principles   Drug/Food Interactions Statins/Certain Fruits   Fiber 30 grams   Whole Grain Foods 3 servings   Protein 8 ounces/day   Saturated Fats 13 max. grams   Fruits and Vegetables 5 servings/day   Personal Nutrition Goals   Personal Goal #1 To increase fruits/vegetables with goal of 5 servings per day.   Personal Goal #2 Read labels for both saturated and trans fat.      Nutrition Discharge:     Rate Your Plate - 16/60/63 0160    Rate Your Plate Scores   Pre Score --  Prefers not to fill out. Met with Dietician before when he was in Cardiac Rehab before.       Education Questionnaire Score:  Knowledge Questionnaire Score - 06/08/14 1449    Knowledge Questionnaire Score   Pre Score 20      Goals reviewed with patient; copy given to patient.

## 2014-10-30 NOTE — Progress Notes (Signed)
Daily Session Note  Patient Details  Name: Scott [name] MRN: [id number] Date of Birth: [birth year] Referring Provider:  Mikle Bosworth,*  Encounter Date: 10/30/2014  Check In:     Session Check In - 10/30/14 0906    Check-In   Staff Present Diane Joya Gaskins RN, BSN;Yulisa Chirico Dillard Essex MS, ACSM CEP Exercise Physiologist;Other   ER physicians immediately available to respond to emergencies See telemetry face sheet for immediately available ER MD   Medication changes reported     No   Fall or balance concerns reported    No   Warm-up and Cool-down Performed on first and last piece of equipment   VAD Patient? No   Pain Assessment   Currently in Pain? No/denies   Multiple Pain Sites No         Goals Met:  Independence with exercise equipment Exercise tolerated well No report of cardiac concerns or symptoms Strength training completed today  Goals Unmet:  Not Applicable  Goals Comments: Did well with all exercise activity today.    Dr. Emily Filbert is Medical Director for [place] [facility] and [facility].

## 2014-11-03 NOTE — Addendum Note (Signed)
Addended by: Felipe Drone on: 11/03/2014 03:11 PM   Modules accepted: Orders

## 2014-11-03 NOTE — Progress Notes (Signed)
Cardiac Individual Treatment Plan  Patient Details  Name: Scott [name] MRN: [id number] Date of Birth: 11-Dec-1940 Referring Provider:  Mikle Bosworth,*  Initial Encounter Date:    Visit Diagnosis: S/P PTCA (percutaneous transluminal coronary angioplasty)  Patient's Home Medications on Admission:  Current outpatient prescriptions:  .  apixaban (ELIQUIS) 5 MG TABS tablet, Take 5 mg by mouth 2 (two) times daily., Disp: , Rfl:  .  aspirin EC 81 MG tablet, Take 1 tablet (81 mg total) by mouth daily., Disp: , Rfl:  .  atorvastatin (LIPITOR) 80 MG tablet, Take by mouth. PM, Disp: , Rfl:  .  gabapentin (NEURONTIN) 300 MG capsule, Take 300 mg by mouth 2 (two) times daily. , Disp: , Rfl:  .  gabapentin (NEURONTIN) 600 MG tablet, Take 600 mg by mouth 3 (three) times daily., Disp: , Rfl:  .  isosorbide mononitrate (IMDUR) 30 MG 24 hr tablet, Take 30 mg by mouth 1 day or 1 dose., Disp: , Rfl:  .  losartan (COZAAR) 50 MG tablet, Take 50 mg by mouth daily., Disp: , Rfl:  .  metoprolol succinate (TOPROL-XL) 50 MG 24 hr tablet, Take 20 mg by mouth daily. , Disp: , Rfl:  .  Multiple Vitamins-Iron (MULTIVITAMINS WITH IRON) TABS tablet, Take 1 tablet by mouth daily., Disp: , Rfl:  .  nitroGLYCERIN (NITROSTAT) 0.4 MG SL tablet, Place 1 tablet (0.4 mg total) under the tongue every 5 (five) minutes as needed for chest pain., Disp: 90 tablet, Rfl: 3 .  prasugrel (EFFIENT) 10 MG TABS tablet, Take 1 tablet (10 mg total) by mouth daily. (Patient not taking: Reported on 09/10/2014), Disp: 30 tablet, Rfl: 11  Past Medical History: Past Medical History  Diagnosis Date  . Cardiomyopathy, ischemic     a. 04/2012 Echo: EF 40-45%;  b. 08/2013 Echo: EF 45-50%, mild glob HK, mod lat/post HK, diast dysfxn, mildly dil LA, mild MR, nl RVSP.  Marland Kitchen Hyperlipidemia   . Carotid arterial disease     a. 05/2013 Carotid U/S; bilat 40-50% ICA stenosis.  Marland Kitchen CAD (coronary artery disease)     a. 04/2012 Cath: 3VD->Med Rx;  b. 04/2013  Cath/PCI: LAD 50p, D1 40, LCX 80p, OM2 99diff, RCA 70p, 27m 95d. Mid & distal RCA treated with 2 DES.    Tobacco Use: History  Smoking status  . Former Smoker -- 1.00 packs/day for 35 years  . Types: Cigarettes  . Start date: 05/02/2009  Smokeless tobacco  . Never Used    Labs: Recent Review Flowsheet Data    Labs for ITP Cardiac and Pulmonary Rehab Latest Ref Rng 09/04/2013 12/15/2013   Cholestrol 0-200 mg/dL 117 122   LDLCALC 0-100 mg/dL 57 56   HDL 40-60 mg/dL 44 51   Trlycerides 0-200 mg/dL 78 76       Exercise Target Goals:    Exercise Program Goal: Individual exercise prescription set with THRR, safety & activity barriers. Participant demonstrates ability to understand and report RPE using BORG scale, to self-measure pulse accurately, and to acknowledge the importance of the exercise prescription.  Exercise Prescription Goal: Starting with aerobic activity 30 plus minutes a day, 3 days per week for initial exercise prescription. Provide home exercise prescription and guidelines that participant acknowledges understanding prior to discharge.  Activity Barriers & Risk Stratification:     Activity Barriers & Risk Stratification - 06/08/14 1400    Activity Barriers & Risk Stratification   Activity Barriers None   Risk Stratification Moderate  6 Minute Walk:     6 Minute Walk      06/08/14 1401 10/28/14 0929     6 Minute Walk   Phase Initial Discharge    Distance 1670 feet 1785 feet    Distance % Change  7 %    Walk Time 6 minutes 6 minutes    Resting HR 87 bpm 82 bpm    Resting BP 122/70 mmHg 128/68 mmHg    Max Ex. HR 102 bpm 102 bpm    Max Ex. BP 136/62 mmHg 126/66 mmHg    RPE 12 11    Perceived Dyspnea   1    Symptoms  No       Initial Exercise Prescription:     Initial Exercise Prescription - 06/08/14 1400    Date of Initial Exercise Prescription   Date 06/08/14   Treadmill   MPH 3   Grade 0   Minutes 15   Bike   Level 0.4   Minutes  15   Recumbant Bike   Level 3   RPM 40   Watts 25   Minutes 15   NuStep   Level 3   Watts 40   Minutes 15   Arm Ergometer   Level 1   Watts 8   Minutes 10   Arm/Foot Ergometer   Level 4   Watts 15   Minutes 10   Cybex   Level 3   RPM 50   Minutes 15   Recumbant Elliptical   Level 1   RPM 40   Watts 10   Minutes 15   Elliptical   Level 1   Speed 3   Minutes 1   REL-XR   Level 3   Watts 45   Minutes 15   Prescription Details   Frequency (times per week) 3   Duration Progress to 30 minutes of continuous aerobic without signs/symptoms of physical distress   Intensity   THRR REST +  30   Ratings of Perceived Exertion 11-15   Resistance Training   Training Prescription Yes   Weight 2   Reps 10-12      Exercise Prescription Changes:     Exercise Prescription Changes      06/16/14 1000 07/28/14 0700 08/10/14 1100 08/12/14 0800 08/26/14 0900   Exercise Review   Progression No  Has not started program yet; on wait list No  Has not started yet  Yes --   Response to Exercise   Blood Pressure (Admit)   126/60 mmHg     Blood Pressure (Exercise)   144/68 mmHg     Blood Pressure (Exit)   110/70 mmHg     Heart Rate (Admit)   69 bpm     Heart Rate (Exercise)   110 bpm     Heart Rate (Exit)   58 bpm     Rating of Perceived Exertion (Exercise)   12     Duration   Progress to 30 minutes of continuous aerobic without signs/symptoms of physical distress Progress to 30 minutes of continuous aerobic without signs/symptoms of physical distress Progress to 30 minutes of continuous aerobic without signs/symptoms of physical distress   Intensity   Rest + 30 Rest + 30 Rest + 30   Progression   Continue progressive overload as per policy without signs/symptoms or physical distress. Continue progressive overload as per policy without signs/symptoms or physical distress. Continue progressive overload as per policy without signs/symptoms or physical distress.   Resistance  Training    Training Prescription   Yes Yes Yes   Weight   _0 Reps   10-12 10-12 10-12   Interval Training   Interval Training     Yes   Equipment     Arm/Foot Ergometer   Treadmill   MPH   3 3 3.5   Grade   0 0 0   Minutes   _1 Arm Ergometer   Level   _2 Watts   _3 Minutes   _4 Arm/Foot Ergometer   Level    4 4   Watts    20 20   Minutes    15 15     09/01/14 0600 10/06/14 0700 10/09/14 0900 10/23/14 0900 10/27/14 0600   Exercise Review   Progression _5    Response to Exercise   Blood Pressure (Admit) 110/73 mmHg 128/60 mmHg 128/60 mmHg 128/60 mmHg 120/68 mmHg   Blood Pressure (Exercise) 122/64 mmHg 136/64 mmHg 136/64 mmHg 136/64 mmHg 140/80 mmHg   Blood Pressure (Exit) 118/60 mmHg 114/60 mmHg 114/60 mmHg 114/60 mmHg 104/60 mmHg   Heart Rate (Admit) 67 bpm 69 bpm 69 bpm 69 bpm 86 bpm   Heart Rate (Exercise) 82 bpm 85 bpm 85 bpm 85 bpm 102 bpm   Heart Rate (Exit) 59 bpm 68 bpm 68 bpm 68 bpm 83 bpm   Rating of Perceived Exertion (Exercise) _6 Symptoms Still experiencing some chest soreness with equipment that involves arms NuStep, BioStep) None None None None   Comments     Scott [name] is continuing to exercise 1 day a week outside of class. He accomplishes this by walking in his neighborhood.    Frequency  Add 1 additional day to program exercise sessions. Add 1 additional day to program exercise sessions. Add 1 additional day to program exercise sessions. Add 1 additional day to program exercise sessions.   Duration Progress to 30 minutes of continuous aerobic without signs/symptoms of physical distress Progress to 50 minutes of aerobic without signs/symptoms of physical distress Progress to 50 minutes of aerobic without signs/symptoms of physical distress Progress to 50 minutes of aerobic without signs/symptoms of physical distress Progress to 50 minutes of aerobic without signs/symptoms of physical distress   Intensity Rest + 30  Rest + 30 Rest + 30 Rest + 30 Rest + 30   Progression Continue progressive overload as per policy without signs/symptoms or physical distress. Continue progressive overload as per policy without signs/symptoms or physical distress. Continue progressive overload as per policy without signs/symptoms or physical distress. Continue progressive overload as per policy without signs/symptoms or physical distress. Continue progressive overload as per policy without signs/symptoms or physical distress.   Resistance Training   Training Prescription _7    Weight _8 Reps 10-12 10-12 10-12 10-12 10-12   Interval Training   Interval Training _9    Equipment Arm/Foot Ergometer;Treadmill Arm/Foot Ergometer;Treadmill Arm/Foot Ergometer;Treadmill Arm/Foot Ergometer;Treadmill Arm/Foot Ergometer;Treadmill   Treadmill   MPH 3.5 3.5 3.5 3.5 3.5   Grade _10 Minutes _11 Arm Ergometer   Level _12 Watts 35 35 35 35 35   Minutes _13 Arm/Foot Ergometer  Level 4 5.5 5.5 6.5 6.5   Watts _0 35   Minutes _1 10/28/14 0900 10/30/14 0900         Exercise Review   Progression Yes Yes      Response to Exercise   Blood Pressure (Admit) 120/68 mmHg --      Blood Pressure (Exercise) 140/80 mmHg --      Blood Pressure (Exit) 104/60 mmHg --      Heart Rate (Admit) 86 bpm --      Heart Rate (Exercise) 102 bpm --      Heart Rate (Exit) 83 bpm --      Rating of Perceived Exertion (Exercise) 12 --      Symptoms None None      Comments Scott [name] is continuing to exercise 1 day a week outside of class. He accomplishes this by walking in his neighborhood.  Patient graduates today and home exercise plans were discussed. Details of the patient's exercise prescription and what they need to do in order to continue the prescription and progress with exercise were outlined and the patient verbalized understanding. The patient plans  to complete all exercise at the [facility] under [employer]      Frequency Add 1 additional day to program exercise sessions. --      Duration Progress to 50 minutes of aerobic without signs/symptoms of physical distress Progress to 50 minutes of aerobic without signs/symptoms of physical distress      Intensity Rest + 30 Rest + 30      Progression Continue progressive overload as per policy without signs/symptoms or physical distress. Continue progressive overload as per policy without signs/symptoms or physical distress.      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 Details of the patient's exercise prescription and what they need to do in order to continue the prescription and progress with exercise were outlined and the patient verbalized understanding. The patient plans to complete all exercise at the [facility]  under [employer]   Frequency --   Duration Progress  to 50 minutes of aerobic without signs/symptoms of physical distress   Intensity Rest + 30   Progression Continue progressive overload as per policy without signs/symptoms or physical distress.   Resistance Training   Training Prescription Yes   Weight 3   Reps 10-12   Interval Training   Interval Training Yes   Equipment Arm/Foot Ergometer;Treadmill   Treadmill   MPH 3.5   Grade 5   Minutes 15   Arm Ergometer   Level 5   Watts 35   Minutes 10   Arm/Foot Ergometer   Level 6.5   Watts 35   Minutes 15   Home Exercise Plan   Plans to continue exercise at [employer] (comment)  [employer] silver sneakers      Nutrition:  Target Goals: Understanding of nutrition guidelines, daily intake of sodium <1563m, cholesterol <2049m calories 30% from fat and 7% or less from saturated fats, daily to have 5 or more servings of fruits and vegetables.  Biometrics:     Pre Biometrics - 06/08/14 1400    Pre Biometrics   Height _0  (1.778 m)   Weight 179 lb 4.8 oz (81.33 kg)   Waist Circumference 37.75 inches   Hip Circumference 37.75 inches   Waist to Hip Ratio 1 %   BMI (Calculated) 25.8         Post Biometrics - 10/28/14 0930     Post  Biometrics   Height _1  (1.778 m)   Weight 180 lb (81.647 kg)   Waist Circumference 40 inches   Hip Circumference 39 inches   Waist to Hip Ratio 1.03 %   BMI (Calculated) 25.9      Nutrition Therapy Plan and Nutrition Goals:     Nutrition Therapy & Goals - 08/21/14 1619    Nutrition Therapy   Diet Instructed heart healthy diet guidelines with emphasis on DASH diet principles   Drug/Food Interactions Statins/Certain Fruits   Fiber 30 grams   Whole Grain Foods 3 servings   Protein 8 ounces/day   Saturated Fats 13 max. grams   Fruits and Vegetables 5 servings/day   Personal Nutrition Goals   Personal Goal #1 To increase fruits/vegetables with goal of 5 servings per day.   Personal Goal #2 Read labels for both saturated and  trans fat.      Nutrition Discharge: Rate Your Plate Scores:     Rate Your Plate - 1054/65/0345465  Rate Your Plate Scores   Post Score 59   Post Score % 65 %      Nutrition Goals Re-Evaluation:     Nutrition Goals Re-Evaluation      08/28/14 1102 10/05/14 1408 10/16/14 0907       Personal Goal #1 Re-Evaluation   Personal Goal #1  To increase fruits/vegetables with goal of 5 servings per day. Scott [name] he is able to increase vegetable intake.     Goal Progress Seen Yes Yes Yes     Comments Trying to eat more vegetables and fruits. Scott [name] to follow nutrition gudelines      Personal Goal #2 Re-Evaluation   Personal Goal #2  Read labels for both saturated and trans fat.      Goal Progress Seen Yes Yes      Comments  Scott [name] to follow nutrition gudelines         Psychosocial: Target Goals: Acknowledge presence or absence of  depression, maximize coping skills, provide positive support system. Participant is able to verbalize types and ability to use techniques and skills needed for reducing stress and depression.  Initial Review & Psychosocial Screening:     Initial Psych Review & Screening - 06/08/14 1500    Initial Review   Current issues with Current Sleep Concerns   Family Dynamics   Good Support System? Yes   Screening Interventions   Interventions Encouraged to exercise      Quality of Life Scores:     Quality of Life - 11/03/14 1452    Quality of Life Scores   Health/Function Post 23.6 %   Health/Function % Change -9.76 %   Socioeconomic Post 24.67 %   Socioeconomic % Change 8.62 %   Psych/Spiritual Post 26.36 %   Psych/Spiritual % Change -14.26 %   Family Post 30 %   GLOBAL Post 25.35 %   GLOBAL % Change -5.09 %      PHQ-9:     Recent Review Flowsheet Data    Depression screen [facility] 2/9 11/03/2014 06/08/2014   Decreased Interest 0 0   Down, Depressed, Hopeless 0 2   PHQ - 2 Score 0 2   Altered sleeping 1 3   Tired, decreased  energy 1 2   Change in appetite 0 1   Feeling bad or failure about yourself  1 2   Trouble concentrating 0 0   Moving slowly or fidgety/restless 0 0   Suicidal thoughts 0 0   PHQ-9 Score 3 10   Difficult doing work/chores Not difficult at all Somewhat difficult      Psychosocial Evaluation and Intervention:     Psychosocial Evaluation - 10/28/14 0939    Discharge Psychosocial Assessment & Intervention   Comments Counselor met with Scott [name] today for follow up and discharge psychosocial evaluation.  He reports feeling much better since starting this program with increased strength and stamina.  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  Scott [name] reports this program has been beneficial  in every way.  He is not sure what his follow up work out program will be.  Counselor encouraged him to develop a goal and be consistent in executing this.  Scott [name] has continued to struggle with sleep issues.  Counselor encouraged eliminating coffee after 3PM and to speak with his doctor about this next week when he sees him. Scott [name] has been a pleasure to work with in every way.        Psychosocial Re-Evaluation:     Psychosocial Re-Evaluation      08/10/14 1014 09/23/14 1001 10/16/14 0906       Psychosocial Re-Evaluation   Interventions   Encouraged to attend Cardiac Rehabilitation for the exercise     Comments Counselor follow up with Scott [name] today reporting began taking the OTC sleep aid last week and is sleeping better and longer.  Counselor commended him for taking action immediately for his improved health.   Counselor follow up with Scott [name] today reporting he no longer is having to take any OTC for sleep (the last 3 nights) as he is sleeping well on his own.  He also reports feeling much stronger and walked a 17 minute mile today on the treadmill - which is remarkable - with a future goal of  15 minutes in the future.  Scott [name] admits to feeling stronger and  experiencing increased stamina since beginning this program.  Counselor congratulated him on his hard work and accomplishements in this program so far.   Goals Comments: Neurotin Scott [name] reports has helped him to sleep now and for his chest pain intermittent none this am.  Was stressful for him before trying to have MD find out what his chest pain was from-Neurotin is helping.         Vocational Rehabilitation: Provide vocational rehab assistance to qualifying candidates.   Vocational Rehab Evaluation & Intervention:     Vocational Rehab - 06/08/14 1451    Initial Vocational Rehab Evaluation & Intervention   Assessment shows need for Vocational Rehabilitation No      Education: Education Goals: Education classes will be provided on a weekly basis, covering required topics. Participant will state understanding/return demonstration of topics presented.  Learning Barriers/Preferences:     Learning Barriers/Preferences - 06/08/14 1448    Learning Barriers/Preferences   Learning Barriers None   Learning Preferences None      Education Topics: General Nutrition Guidelines/Fats and Fiber: -Group instruction provided by verbal, written material, models and posters to present the general guidelines for heart healthy nutrition. Gives an explanation and review of dietary fats and fiber.   Controlling Sodium/Reading Food Labels: -Group verbal and written material supporting the discussion of sodium use in heart healthy nutrition. Review and explanation with models, verbal and written materials for utilization of the food label.   Exercise Physiology & Risk Factors: - Group verbal and written instruction with models to review the exercise physiology of the cardiovascular system and associated critical values. Details cardiovascular disease risk factors and the goals associated with each risk factor.   Aerobic Exercise &  Resistance Training: - Gives group verbal and written discussion on the health impact of inactivity. On the components of aerobic and resistive training programs and the benefits of this training and how to safely progress through these programs.          Cardiac Rehab from 10/16/2014 in [facility] Cardiac Rehab   Date  08/10/14   Educator  rm   Instruction Review Code  2- meets goals/outcomes      Flexibility, Balance, General Exercise Guidelines: - Provides group verbal and written instruction on the benefits of flexibility and balance training programs. Provides general exercise guidelines with specific guidelines to those with heart or lung disease. Demonstration and skill practice provided.   Stress Management: - Provides group verbal and written instruction about the health risks of elevated stress, cause of high stress, and healthy ways to reduce stress.   Depression: - Provides group verbal and written instruction on the correlation between heart/lung disease and depressed mood, treatment options, and the stigmas associated with seeking treatment.   Anatomy & Physiology of the Heart: - Group verbal and written instruction and models provide basic cardiac anatomy and physiology, with the coronary electrical and arterial systems. Review of: AMI, Angina, Valve disease, Heart Failure, Cardiac Arrhythmia, Pacemakers, and the ICD.   Cardiac Procedures: - Group verbal and written instruction and models to describe the testing methods done to diagnose heart disease. Reviews the outcomes of the test results. Describes the treatment choices: Medical Management, Angioplasty, or Coronary Bypass Surgery.      Cardiac Rehab from 10/16/2014 in [facility] Cardiac Rehab   Date  07/29/14   Educator  SB   Instruction Review Code  2- meets goals/outcomes      Cardiac Medications: - Group verbal and written instruction to review commonly  prescribed medications for heart disease. Reviews the medication,  class of the drug, and [name] effects. Includes the steps to properly store meds and maintain the prescription regimen.   Go Sex-Intimacy & Heart Disease, Get SMART - Goal Setting: - Group verbal and written instruction through game format to discuss heart disease and the return to sexual intimacy. Provides group verbal and written material to discuss and apply goal setting through the application of the S.M.A.R.T. Method.      Cardiac Rehab from 10/16/2014 in [facility] Cardiac Rehab   Date  07/29/14   Educator  SB   Instruction Review Code  2- meets goals/outcomes      Other Matters of the Heart: - Provides group verbal, written materials and models to describe Heart Failure, Angina, Valve Disease, and Diabetes in the realm of heart disease. Includes description of the disease process and treatment options available to the cardiac patient.      Cardiac Rehab from 10/16/2014 in [facility] Cardiac Rehab   Date  10/16/14   Educator  C. Enterkin   Instruction Review Code  1- partially meets, needs review/practice Kari Baars to let us know if chest pain any different.]      Exercise & Equipment Safety: - Individual verbal instruction and demonstration of equipment use and safety with use of the equipment.      Cardiac Rehab from 10/16/2014 in [facility] Cardiac Rehab   Date  06/08/14   Educator  C. Enterkin,RN   Instruction Review Code  1- partially meets, needs review/practice      Infection Prevention: - Provides verbal and written material to individual with discussion of infection control including proper hand washing and proper equipment cleaning during exercise session.      Cardiac Rehab from 10/16/2014 in [facility] Cardiac Rehab   Date  06/08/14   Educator  C. Enterkin,RN   Instruction Review Code  2- meets goals/outcomes      Falls Prevention: - Provides verbal and written material to individual with discussion of falls prevention and safety.      Cardiac Rehab from 10/16/2014 in [facility] Cardiac  Rehab   Date  06/08/14   Educator  C. Enterkin,RN   Instruction Review Code  2- meets goals/outcomes      Diabetes: - Individual verbal and written instruction to review signs/symptoms of diabetes, desired ranges of glucose level fasting, after meals and with exercise. Advice that pre and post exercise glucose checks will be done for 3 sessions at entry of program.    Knowledge Questionnaire Score:     Knowledge Questionnaire Score - 11/03/14 1446    Knowledge Questionnaire Score   Post Score 25/28      Personal Goals and Risk Factors at Admission:     Personal Goals and Risk Factors at Admission - 08/07/14 0904    Personal Goals and Risk Factors on Admission    Weight Management No   Increase Aerobic Exercise and Physical Activity Yes   Intervention While in program, learn and follow the exercise prescription taught. Start at a low level workload and increase workload after able to maintain previous level for 30 minutes. Increase time before increasing intensity.      Personal Goals and Risk Factors Review:      Goals and Risk Factor Review      08/07/14 0905 08/21/14 1302 08/28/14 1102 10/05/14 1409 10/16/14 0908   Increase Aerobic Exercise and Physical Activity   Goals Progress/Improvement seen  _0   Comments Scott [name] has started session after CABG, he is progressing with the exercise prescription without stated concerns nor complaints.  Scott [name] had limited his use of his arms. He is experiencing pain/soreness in chest as he starts using his arms. He has been encouraged to lightly use his arms to prevent stiffness in the muscles. When I asked Scott [name] what happened to his arm with the bandaid. He said he tripped over something on his deck and fell flat on his chest and it still hurts. I would not let him exericse in Cardiac Rehab today and sent him to his Primary Care MD. 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  Scott [name] has been out for eye surgery and was released to return. He continues to follow exercise prescription and progress is good. Has increased his level on Scifit especially since he is now sleeping better with Neurotin.   Hypertension   Progress seen toward goals  Yes  Yes    Comments  BP within normal range  continues with good BP control    Abnormal Lipids   Progress seen towards goals  Unknown  Unknown    Comments  no labs to review  no labs to review       Personal Goals Discharge (Final Personal Goals and Risk Factors Review):      Goals and Risk Factor Review - 10/16/14 0908    Increase Aerobic Exercise and Physical Activity   Goals Progress/Improvement seen  Yes   Comments Has increased his level on Scifit especially since he is now sleeping better with Neurotin.       Comments:  Patient completed 32/36 sessions of cardiac rehab.  Discharged from program on October 7th, 2016.

## 2014-11-04 NOTE — Progress Notes (Signed)
Cardiac Individual Treatment Plan  Patient Details  Name: Scott [name] MRN: [id number] Date of Birth: Aug 06, 1940 Referring Provider:  Mikle Bosworth,*  Initial Encounter Date:    Visit Diagnosis: S/P PTCA (percutaneous transluminal coronary angioplasty) - Plan: CARDIAC REHAB 30 DAY REVIEW  Patient's Home Medications on Admission:  Current outpatient prescriptions:  .  apixaban (ELIQUIS) 5 MG TABS tablet, Take 5 mg by mouth 2 (two) times daily., Disp: , Rfl:  .  aspirin EC 81 MG tablet, Take 1 tablet (81 mg total) by mouth daily., Disp: , Rfl:  .  atorvastatin (LIPITOR) 80 MG tablet, Take by mouth. PM, Disp: , Rfl:  .  gabapentin (NEURONTIN) 300 MG capsule, Take 300 mg by mouth 2 (two) times daily. , Disp: , Rfl:  .  gabapentin (NEURONTIN) 600 MG tablet, Take 600 mg by mouth 3 (three) times daily., Disp: , Rfl:  .  isosorbide mononitrate (IMDUR) 30 MG 24 hr tablet, Take 30 mg by mouth 1 day or 1 dose., Disp: , Rfl:  .  losartan (COZAAR) 50 MG tablet, Take 50 mg by mouth daily., Disp: , Rfl:  .  metoprolol succinate (TOPROL-XL) 50 MG 24 hr tablet, Take 20 mg by mouth daily. , Disp: , Rfl:  .  Multiple Vitamins-Iron (MULTIVITAMINS WITH IRON) TABS tablet, Take 1 tablet by mouth daily., Disp: , Rfl:  .  nitroGLYCERIN (NITROSTAT) 0.4 MG SL tablet, Place 1 tablet (0.4 mg total) under the tongue every 5 (five) minutes as needed for chest pain., Disp: 90 tablet, Rfl: 3 .  prasugrel (EFFIENT) 10 MG TABS tablet, Take 1 tablet (10 mg total) by mouth daily. (Patient not taking: Reported on 09/10/2014), Disp: 30 tablet, Rfl: 11  Past Medical History: Past Medical History  Diagnosis Date  . Cardiomyopathy, ischemic     a. 04/2012 Echo: EF 40-45%;  b. 08/2013 Echo: EF 45-50%, mild glob HK, mod lat/post HK, diast dysfxn, mildly dil LA, mild MR, nl RVSP.  Marland Kitchen Hyperlipidemia   . Carotid arterial disease     a. 05/2013 Carotid U/S; bilat 40-50% ICA stenosis.  Marland Kitchen CAD (coronary artery disease)     a.  04/2012 Cath: 3VD->Med Rx;  b. 04/2013 Cath/PCI: LAD 50p, D1 40, LCX 80p, OM2 99diff, RCA 70p, 72m, 95d. Mid & distal RCA treated with 2 DES.    Tobacco Use: History  Smoking status  . Former Smoker -- 1.00 packs/day for 35 years  . Types: Cigarettes  . Start date: 05/02/2009  Smokeless tobacco  . Never Used    Labs: Recent Review Flowsheet Data    Labs for ITP Cardiac and Pulmonary Rehab Latest Ref Rng 09/04/2013 12/15/2013   Cholestrol 0-200 mg/dL 117 122   LDLCALC 0-100 mg/dL 57 56   HDL 40-60 mg/dL 44 51   Trlycerides 0-200 mg/dL 78 76       Exercise Target Goals:    Exercise Program Goal: Individual exercise prescription set with THRR, safety & activity barriers. Participant demonstrates ability to understand and report RPE using BORG scale, to self-measure pulse accurately, and to acknowledge the importance of the exercise prescription.  Exercise Prescription Goal: Starting with aerobic activity 30 plus minutes a day, 3 days per week for initial exercise prescription. Provide home exercise prescription and guidelines that participant acknowledges understanding prior to discharge.  Activity Barriers & Risk Stratification:     Activity Barriers & Risk Stratification - 06/08/14 1400    Activity Barriers & Risk Stratification   Activity Barriers None  Risk Stratification Moderate      6 Minute Walk:     6 Minute Walk      06/08/14 1401 10/28/14 0929     6 Minute Walk   Phase Initial Discharge    Distance 1670 feet 1785 feet    Distance % Change  7 %    Walk Time 6 minutes 6 minutes    Resting HR 87 bpm 82 bpm    Resting BP 122/70 mmHg 128/68 mmHg    Max Ex. HR 102 bpm 102 bpm    Max Ex. BP 136/62 mmHg 126/66 mmHg    RPE 12 11    Perceived Dyspnea   1    Symptoms  No       Initial Exercise Prescription:     Initial Exercise Prescription - 06/08/14 1400    Date of Initial Exercise Prescription   Date 06/08/14   Treadmill   MPH 3   Grade 0    Minutes 15   Bike   Level 0.4   Minutes 15   Recumbant Bike   Level 3   RPM 40   Watts 25   Minutes 15   NuStep   Level 3   Watts 40   Minutes 15   Arm Ergometer   Level 1   Watts 8   Minutes 10   Arm/Foot Ergometer   Level 4   Watts 15   Minutes 10   Cybex   Level 3   RPM 50   Minutes 15   Recumbant Elliptical   Level 1   RPM 40   Watts 10   Minutes 15   Elliptical   Level 1   Speed 3   Minutes 1   REL-XR   Level 3   Watts 45   Minutes 15   Prescription Details   Frequency (times per week) 3   Duration Progress to 30 minutes of continuous aerobic without signs/symptoms of physical distress   Intensity   THRR REST +  30   Ratings of Perceived Exertion 11-15   Resistance Training   Training Prescription Yes   Weight 2   Reps 10-12      Exercise Prescription Changes:     Exercise Prescription Changes      06/16/14 1000 07/28/14 0700 08/10/14 1100 08/12/14 0800 08/26/14 0900   Exercise Review   Progression No  Has not started program yet; on wait list No  Has not started yet  Yes --   Response to Exercise   Blood Pressure (Admit)   126/60 mmHg     Blood Pressure (Exercise)   144/68 mmHg     Blood Pressure (Exit)   110/70 mmHg     Heart Rate (Admit)   69 bpm     Heart Rate (Exercise)   110 bpm     Heart Rate (Exit)   58 bpm     Rating of Perceived Exertion (Exercise)   12     Duration   Progress to 30 minutes of continuous aerobic without signs/symptoms of physical distress Progress to 30 minutes of continuous aerobic without signs/symptoms of physical distress Progress to 30 minutes of continuous aerobic without signs/symptoms of physical distress   Intensity   Rest + 30 Rest + 30 Rest + 30   Progression   Continue progressive overload as per policy without signs/symptoms or physical distress. Continue progressive overload as per policy without signs/symptoms or physical distress. Continue progressive overload as per policy  without signs/symptoms or  physical distress.   Resistance Training   Training Prescription   Yes Yes Yes   Weight   2 2 2    Reps   10-12 10-12 10-12   Interval Training   Interval Training     Yes   Equipment     Arm/Foot Ergometer   Treadmill   MPH   3 3 3.5   Grade   0 0 0   Minutes   15 15 15    Arm Ergometer   Level   1 1 1    Watts   10 10 10    Minutes   10 10 10    Arm/Foot Ergometer   Level    4 4   Watts    20 20   Minutes    15 15     09/01/14 0600 10/06/14 0700 10/09/14 0900 10/23/14 0900 10/27/14 0600   Exercise Review   Progression Yes Yes Yes Yes Yes   Response to Exercise   Blood Pressure (Admit) 110/73 mmHg 128/60 mmHg 128/60 mmHg 128/60 mmHg 120/68 mmHg   Blood Pressure (Exercise) 122/64 mmHg 136/64 mmHg 136/64 mmHg 136/64 mmHg 140/80 mmHg   Blood Pressure (Exit) 118/60 mmHg 114/60 mmHg 114/60 mmHg 114/60 mmHg 104/60 mmHg   Heart Rate (Admit) 67 bpm 69 bpm 69 bpm 69 bpm 86 bpm   Heart Rate (Exercise) 82 bpm 85 bpm 85 bpm 85 bpm 102 bpm   Heart Rate (Exit) 59 bpm 68 bpm 68 bpm 68 bpm 83 bpm   Rating of Perceived Exertion (Exercise) 13 13 13 13 12    Symptoms Still experiencing some chest soreness with equipment that involves arms NuStep, BioStep) None None None None   Comments     Koree is continuing to exercise 1 day a week outside of class. He accomplishes this by walking in his neighborhood.    Frequency  Add 1 additional day to program exercise sessions. Add 1 additional day to program exercise sessions. Add 1 additional day to program exercise sessions. Add 1 additional day to program exercise sessions.   Duration Progress to 30 minutes of continuous aerobic without signs/symptoms of physical distress Progress to 50 minutes of aerobic without signs/symptoms of physical distress Progress to 50 minutes of aerobic without signs/symptoms of physical distress Progress to 50 minutes of aerobic without signs/symptoms of physical distress Progress to 50 minutes of aerobic without signs/symptoms of  physical distress   Intensity Rest + 30 Rest + 30 Rest + 30 Rest + 30 Rest + 30   Progression Continue progressive overload as per policy without signs/symptoms or physical distress. Continue progressive overload as per policy without signs/symptoms or physical distress. Continue progressive overload as per policy without signs/symptoms or physical distress. Continue progressive overload as per policy without signs/symptoms or physical distress. Continue progressive overload as per policy without signs/symptoms or physical distress.   Resistance Training   Training Prescription Yes Yes Yes Yes Yes   Weight 2 2 3 3 3    Reps 10-12 10-12 10-12 10-12 10-12   Interval Training   Interval Training Yes Yes Yes Yes Yes   Equipment Arm/Foot Ergometer;Treadmill Arm/Foot Ergometer;Treadmill Arm/Foot Ergometer;Treadmill Arm/Foot Ergometer;Treadmill Arm/Foot Ergometer;Treadmill   Treadmill   MPH 3.5 3.5 3.5 3.5 3.5   Grade 5 5 5 5 5    Minutes 15 15 15 15 15    Arm Ergometer   Level 5 5 5 5 5    Watts 35 35 35 35 35   Minutes 10 10 10  10 10   Arm/Foot Ergometer   Level 4 5.5 5.5 6.5 6.5   Watts $Remo'20 20 20 30 'BhLqQ$ 35   Minutes $Remove'15 15 15 15 15     'gXAlJVe$ 10/28/14 0900 10/30/14 0900         Exercise Review   Progression Yes Yes      Response to Exercise   Blood Pressure (Admit) 120/68 mmHg --      Blood Pressure (Exercise) 140/80 mmHg --      Blood Pressure (Exit) 104/60 mmHg --      Heart Rate (Admit) 86 bpm --      Heart Rate (Exercise) 102 bpm --      Heart Rate (Exit) 83 bpm --      Rating of Perceived Exertion (Exercise) 12 --      Symptoms None None      Comments Ameet is continuing to exercise 1 day a week outside of class. He accomplishes this by walking in his neighborhood.  Patient graduates today and home exercise plans were discussed. Details of the patient's exercise prescription and what they need to do in order to continue the prescription and progress with exercise were outlined and the patient  verbalized understanding. The patient plans to complete all exercise at the [facility] under [employer]      Frequency Add 1 additional day to program exercise sessions. --      Duration Progress to 50 minutes of aerobic without signs/symptoms of physical distress Progress to 50 minutes of aerobic without signs/symptoms of physical distress      Intensity Rest + 30 Rest + 30      Progression Continue progressive overload as per policy without signs/symptoms or physical distress. Continue progressive overload as per policy without signs/symptoms or physical distress.      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 Details of the patient's exercise prescription and what they need to do in order to continue the prescription and progress with exercise were outlined and the patient verbalized understanding. The patient  plans to complete all exercise at the [facility] under Silver  [employer]neakers   Frequency --   Duration Progress to 50 minutes of aerobic without signs/symptoms of physical distress   Intensity Rest + 30   Progression Continue progressive overload as per policy without signs/symptoms or physical distress.   Resistance Training   Training Prescription Yes   Weight 3   Reps 10-12   Interval Training   Interval Training Yes   Equipment Arm/Foot Ergometer;Treadmill   Treadmill   MPH 3.5   Grade 5   Minutes 15   Arm Ergometer   Level 5   Watts 35   Minutes 10   Arm/Foot Ergometer   Level 6.5   Watts 35   Minutes 15   Home Exercise Plan   Plans to continue exercise at [employer] (comment)  [employer] silver sneakers      Nutrition:  Target Goals: Understanding of nutrition guidelines, daily intake of sodium '1500mg'$ , cholesterol '200mg'$ , calories 30% from fat and 7% or less from saturated fats, daily to have 5 or more servings of fruits and vegetables.  Biometrics:     Pre Biometrics - 06/08/14 1400    Pre Biometrics   Height $Remov'5\' 10"'gqfpgA$  (1.778 m)   Weight 179 lb 4.8 oz (81.33 kg)   Waist Circumference 37.75 inches   Hip Circumference 37.75 inches   Waist to Hip Ratio 1 %   BMI (Calculated) 25.8         Post Biometrics - 10/28/14 0930     Post  Biometrics   Height $Remov'5\' 10"'zpeWJJ$  (1.778 m)   Weight 180 lb (81.647 kg)   Waist Circumference 40 inches   Hip Circumference 39 inches   Waist to Hip Ratio 1.03 %   BMI (Calculated) 25.9      Nutrition Therapy Plan and Nutrition Goals:     Nutrition Therapy & Goals - 08/21/14 1619    Nutrition Therapy   Diet Instructed heart healthy diet guidelines with emphasis on DASH diet principles   Drug/Food Interactions Statins/Certain Fruits   Fiber 30 grams   Whole Grain Foods 3 servings   Protein 8 ounces/day   Saturated Fats 13 max. grams   Fruits and Vegetables 5 servings/day   Personal Nutrition Goals   Personal Goal #1 To increase fruits/vegetables with goal of 5 servings per day.   Personal  Goal #2 Read labels for both saturated and trans fat.      Nutrition Discharge: Rate Your Plate Scores:     Rate Your Plate - 15/05/69 7948    Rate Your Plate Scores   Post Score 59   Post Score % 65 %      Nutrition Goals Re-Evaluation:     Nutrition Goals Re-Evaluation      08/28/14 1102 10/05/14 1408 10/16/14 0907       Personal Goal #1 Re-Evaluation   Personal Goal #1  To increase fruits/vegetables with goal of 5 servings per day. Johnryan said he is able to increase vegetable intake.     Goal Progress Seen Yes Yes Yes     Comments Trying to eat more vegetables and fruits. Tamas continues to follow nutrition gudelines      Personal Goal #2 Re-Evaluation   Personal Goal #2  Read labels for both saturated and trans fat.      Goal Progress Seen Yes Yes      Comments  Marice continues to follow nutrition gudelines  Psychosocial: Target Goals: Acknowledge presence or absence of depression, maximize coping skills, provide positive support system. Participant is able to verbalize types and ability to use techniques and skills needed for reducing stress and depression.  Initial Review & Psychosocial Screening:     Initial Psych Review & Screening - 06/08/14 1500    Initial Review   Current issues with Current Sleep Concerns   Family Dynamics   Good Support System? Yes   Screening Interventions   Interventions Encouraged to exercise      Quality of Life Scores:     Quality of Life - 11/03/14 1452    Quality of Life Scores   Health/Function Post 23.6 %   Health/Function % Change -9.76 %   Socioeconomic Post 24.67 %   Socioeconomic % Change 8.62 %   Psych/Spiritual Post 26.36 %   Psych/Spiritual % Change -14.26 %   Family Post 30 %   GLOBAL Post 25.35 %   GLOBAL % Change -5.09 %      PHQ-9:     Recent Review Flowsheet Data    Depression screen [facility] 2/9 11/03/2014 06/08/2014   Decreased Interest 0 0   Down, Depressed, Hopeless 0 2   PHQ - 2 Score 0 2    Altered sleeping 1 3   Tired, decreased energy 1 2   Change in appetite 0 1   Feeling bad or failure about yourself  1 2   Trouble concentrating 0 0   Moving slowly or fidgety/restless 0 0   Suicidal thoughts 0 0   PHQ-9 Score 3 10   Difficult doing work/chores Not difficult at all Somewhat difficult      Psychosocial Evaluation and Intervention:     Psychosocial Evaluation - 10/28/14 0939    Discharge Psychosocial Assessment & Intervention   Comments Counselor met with Mr. Huaracha today for follow up and discharge psychosocial evaluation.  He reports feeling much better since starting this program with increased strength and stamina.  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  Mr. Majid reports this program has been beneficial  in every way.  He is not sure what his follow up work out program will be.  Counselor encouraged him to develop a goal and be consistent in executing this.  Mr. Wint has continued to struggle with sleep issues.  Counselor encouraged eliminating coffee after 3PM and to speak with his doctor about this next week when he sees him. Mr. Koy has been a pleasure to work with in every way.        Psychosocial Re-Evaluation:     Psychosocial Re-Evaluation      08/10/14 1014 09/23/14 1001 10/16/14 0906 11/04/14 1038     Psychosocial Re-Evaluation   Interventions   Encouraged to attend Cardiac Rehabilitation for the exercise     Comments Counselor follow up with Mr. Vallejo today reporting began taking the OTC sleep aid last week and is sleeping better and longer.  Counselor commended him for taking action immediately for his improved health.   Counselor follow up with Mr. Hammerschmidt today reporting he no longer is having to take any OTC for sleep (the last 3 nights) as he is sleeping well on his own.  He also reports feeling much stronger and walked a 17 minute mile today on the  treadmill - which is remarkable - with a future goal of 15 minutes in the future.  Mr. Altizer admits to feeling stronger and experiencing increased stamina since beginning this program.  Counselor congratulated him on his hard work and accomplishements in this program so far.   Goals Comments: Neurotin Gor reports has helped him to sleep now and for his chest pain intermittent none this am.  Was stressful for him before trying to have MD find out what his chest pain was from-Neurotin is helping.  --  Miro has done well in Cardiac Rehab.        Vocational Rehabilitation: Provide vocational rehab assistance to qualifying candidates.   Vocational Rehab Evaluation & Intervention:     Vocational Rehab - 06/08/14 1451    Initial Vocational Rehab Evaluation & Intervention   Assessment shows need for Vocational Rehabilitation No      Education: Education Goals: Education classes will be provided on a weekly basis, covering required topics. Participant will state understanding/return demonstration of topics presented.  Learning Barriers/Preferences:     Learning Barriers/Preferences - 06/08/14 1448    Learning Barriers/Preferences   Learning Barriers None   Learning Preferences None      Education Topics: General Nutrition Guidelines/Fats and Fiber: -Group instruction provided by verbal, written material, models and posters to present the general guidelines for heart healthy nutrition. Gives an explanation and review of dietary fats and fiber.   Controlling Sodium/Reading Food Labels: -Group verbal and written material supporting the discussion of sodium use in heart healthy nutrition. Review and explanation with models, verbal and written materials for utilization of the food label.   Exercise Physiology & Risk Factors: - Group verbal and written instruction with models to review the exercise physiology of the cardiovascular system and associated critical values. Details  cardiovascular disease risk factors and the goals associated with each risk factor.   Aerobic Exercise & Resistance Training: - Gives group verbal and written discussion on the health impact of inactivity. On the components of aerobic and resistive training programs and the benefits of this training and how to safely progress through these programs.          Cardiac Rehab from 10/16/2014 in [facility] Cardiac Rehab   Date  08/10/14   Educator  rm   Instruction Review Code  2- meets goals/outcomes      Flexibility, Balance, General Exercise Guidelines: - Provides group verbal and written instruction on the benefits of flexibility and balance training programs. Provides general exercise guidelines with specific guidelines to those with heart or lung disease. Demonstration and skill practice provided.   Stress Management: - Provides group verbal and written instruction about the health risks of elevated stress, cause of high stress, and healthy ways to reduce stress.   Depression: - Provides group verbal and written instruction on the correlation between heart/lung disease and depressed mood, treatment options, and the stigmas associated with seeking treatment.   Anatomy & Physiology of the Heart: - Group verbal and written instruction and models provide basic cardiac anatomy and physiology, with the coronary electrical and arterial systems. Review of: AMI, Angina, Valve disease, Heart Failure, Cardiac Arrhythmia, Pacemakers, and the ICD.   Cardiac Procedures: - Group verbal and written instruction and models to describe the testing methods done to diagnose heart disease. Reviews the outcomes of the test results. Describes the treatment choices: Medical Management, Angioplasty, or Coronary Bypass Surgery.      Cardiac Rehab from 10/16/2014 in [facility] Cardiac Rehab   Date  07/29/14   Educator  SB   Instruction Review Code  2- meets goals/outcomes  Cardiac Medications: - Group verbal and  written instruction to review commonly prescribed medications for heart disease. Reviews the medication, class of the drug, and side effects. Includes the steps to properly store meds and maintain the prescription regimen.   Go Sex-Intimacy & Heart Disease, Get SMART - Goal Setting: - Group verbal and written instruction through game format to discuss heart disease and the return to sexual intimacy. Provides group verbal and written material to discuss and apply goal setting through the application of the S.M.A.R.T. Method.      Cardiac Rehab from 10/16/2014 in [facility] Cardiac Rehab   Date  07/29/14   Educator  SB   Instruction Review Code  2- meets goals/outcomes      Other Matters of the Heart: - Provides group verbal, written materials and models to describe Heart Failure, Angina, Valve Disease, and Diabetes in the realm of heart disease. Includes description of the disease process and treatment options available to the cardiac patient.      Cardiac Rehab from 10/16/2014 in [facility] Cardiac Rehab   Date  10/16/14   Educator  C. Seryna Marek   Instruction Review Code  1- partially meets, needs review/practice Jaye Beagle to let us know if chest pain any different.]      Exercise & Equipment Safety: - Individual verbal instruction and demonstration of equipment use and safety with use of the equipment.      Cardiac Rehab from 10/16/2014 in [facility] Cardiac Rehab   Date  06/08/14   Educator  C. Knox Holdman,RN   Instruction Review Code  1- partially meets, needs review/practice      Infection Prevention: - Provides verbal and written material to individual with discussion of infection control including proper hand washing and proper equipment cleaning during exercise session.      Cardiac Rehab from 10/16/2014 in [facility] Cardiac Rehab   Date  06/08/14   Educator  C. Rajon Bisig,RN   Instruction Review Code  2- meets goals/outcomes      Falls Prevention: - Provides verbal and written material to  individual with discussion of falls prevention and safety.      Cardiac Rehab from 10/16/2014 in [facility] Cardiac Rehab   Date  06/08/14   Educator  C. Ceira Hoeschen,RN   Instruction Review Code  2- meets goals/outcomes      Diabetes: - Individual verbal and written instruction to review signs/symptoms of diabetes, desired ranges of glucose level fasting, after meals and with exercise. Advice that pre and post exercise glucose checks will be done for 3 sessions at entry of program.    Knowledge Questionnaire Score:     Knowledge Questionnaire Score - 11/03/14 1446    Knowledge Questionnaire Score   Post Score 25/28      Personal Goals and Risk Factors at Admission:     Personal Goals and Risk Factors at Admission - 08/07/14 0904    Personal Goals and Risk Factors on Admission    Weight Management No   Increase Aerobic Exercise and Physical Activity Yes   Intervention While in program, learn and follow the exercise prescription taught. Start at a low level workload and increase workload after able to maintain previous level for 30 minutes. Increase time before increasing intensity.      Personal Goals and Risk Factors Review:      Goals and Risk Factor Review      08/07/14 0905 08/21/14 1302 08/28/14 1102 10/05/14 1409 10/16/14 0908   Increase Aerobic Exercise and Physical Activity  Goals Progress/Improvement seen  Yes Yes Yes Yes Yes   Comments Mckenna has started session after CABG, he is progressing with the exercise prescription without stated concerns nor complaints.  Jahmari had limited his use of his arms. He is experiencing pain/soreness in chest as he starts using his arms. He has been encouraged to lightly use his arms to prevent stiffness in the muscles. When I asked Braedin what happened to his arm with the bandaid. He said he tripped over something on his deck and fell flat on his chest and it still hurts. I would not let him exericse in Cardiac Rehab today and sent him to his  Primary Care MD. [name] reported he saw his primary care MD  who did a chest xray and said his s/p CABG sternum is ok and no broken ribs.  Unnamed has been out for eye surgery and was released to return. He continues to follow exercise prescription and progress is good. Has increased his level on Scifit especially since he is now sleeping better with Neurotin.   Hypertension   Progress seen toward goals  Yes  Yes    Comments  BP within normal range  continues with good BP control    Abnormal Lipids   Progress seen towards goals  Unknown  Unknown    Comments  no labs to review  no labs to review      11/04/14 1038           Increase Aerobic Exercise and Physical Activity   Goals Progress/Improvement seen  Yes       Hypertension   Progress seen toward goals Yes       Abnormal Lipids   Progress seen towards goals Yes          Personal Goals Discharge (Final Personal Goals and Risk Factors Review):      Goals and Risk Factor Review - 11/04/14 1038    Increase Aerobic Exercise and Physical Activity   Goals Progress/Improvement seen  Yes   Hypertension   Progress seen toward goals Yes   Abnormal Lipids   Progress seen towards goals Yes       Comments: Birch has done well in Cardiac REhab.

## 2014-11-04 NOTE — Addendum Note (Signed)
Addended by: Gerlene Burdock on: 11/04/2014 10:41 AM   Modules accepted: Orders

## 2014-11-06 ENCOUNTER — Telehealth: Payer: Self-pay | Admitting: *Deleted

## 2014-11-06 NOTE — Telephone Encounter (Signed)
Lmom to call our office to schedule a carotid u/s (1 yr f/u, past due).

## 2014-11-09 NOTE — Telephone Encounter (Signed)
Pt called back and said he will not be needing it this year.

## 2014-11-18 ENCOUNTER — Encounter: Payer: Self-pay | Admitting: *Deleted

## 2014-12-07 ENCOUNTER — Encounter: Payer: Self-pay | Admitting: *Deleted

## 2014-12-07 ENCOUNTER — Emergency Department: Payer: Medicare Other

## 2014-12-07 ENCOUNTER — Observation Stay (HOSPITAL_BASED_OUTPATIENT_CLINIC_OR_DEPARTMENT_OTHER)
Admit: 2014-12-07 | Discharge: 2014-12-07 | Disposition: A | Discharge status: Home / Self Care | Payer: Medicare Other | Attending: Physician Assistant | Admitting: Physician Assistant

## 2014-12-07 ENCOUNTER — Observation Stay
Admission: EM | Admit: 2014-12-07 | Discharge: 2014-12-08 | Disposition: A | Discharge status: Home / Self Care | Payer: Medicare Other | Attending: Internal Medicine | Admitting: Internal Medicine

## 2014-12-07 DIAGNOSIS — Z951 Presence of aortocoronary bypass graft: Secondary | ICD-10-CM | POA: Diagnosis not present

## 2014-12-07 DIAGNOSIS — I6523 Occlusion and stenosis of bilateral carotid arteries: Secondary | ICD-10-CM | POA: Diagnosis not present

## 2014-12-07 DIAGNOSIS — Z87891 Personal history of nicotine dependence: Secondary | ICD-10-CM | POA: Diagnosis not present

## 2014-12-07 DIAGNOSIS — R51 Headache: Secondary | ICD-10-CM | POA: Insufficient documentation

## 2014-12-07 DIAGNOSIS — Z885 Allergy status to narcotic agent status: Secondary | ICD-10-CM | POA: Insufficient documentation

## 2014-12-07 DIAGNOSIS — I10 Essential (primary) hypertension: Secondary | ICD-10-CM | POA: Insufficient documentation

## 2014-12-07 DIAGNOSIS — I255 Ischemic cardiomyopathy: Secondary | ICD-10-CM | POA: Diagnosis not present

## 2014-12-07 DIAGNOSIS — R0602 Shortness of breath: Secondary | ICD-10-CM | POA: Diagnosis not present

## 2014-12-07 DIAGNOSIS — I429 Cardiomyopathy, unspecified: Secondary | ICD-10-CM | POA: Diagnosis not present

## 2014-12-07 DIAGNOSIS — Z79899 Other long term (current) drug therapy: Secondary | ICD-10-CM | POA: Insufficient documentation

## 2014-12-07 DIAGNOSIS — I25110 Atherosclerotic heart disease of native coronary artery with unstable angina pectoris: Secondary | ICD-10-CM | POA: Diagnosis not present

## 2014-12-07 DIAGNOSIS — Z8249 Family history of ischemic heart disease and other diseases of the circulatory system: Secondary | ICD-10-CM | POA: Diagnosis not present

## 2014-12-07 DIAGNOSIS — I200 Unstable angina: Secondary | ICD-10-CM | POA: Diagnosis not present

## 2014-12-07 DIAGNOSIS — Z91041 Radiographic dye allergy status: Secondary | ICD-10-CM | POA: Insufficient documentation

## 2014-12-07 DIAGNOSIS — I480 Paroxysmal atrial fibrillation: Secondary | ICD-10-CM

## 2014-12-07 DIAGNOSIS — Z9842 Cataract extraction status, left eye: Secondary | ICD-10-CM | POA: Insufficient documentation

## 2014-12-07 DIAGNOSIS — R0789 Other chest pain: Secondary | ICD-10-CM

## 2014-12-07 DIAGNOSIS — R079 Chest pain, unspecified: Secondary | ICD-10-CM | POA: Diagnosis not present

## 2014-12-07 DIAGNOSIS — Z7901 Long term (current) use of anticoagulants: Secondary | ICD-10-CM

## 2014-12-07 DIAGNOSIS — I509 Heart failure, unspecified: Secondary | ICD-10-CM | POA: Diagnosis not present

## 2014-12-07 DIAGNOSIS — D649 Anemia, unspecified: Secondary | ICD-10-CM | POA: Diagnosis not present

## 2014-12-07 DIAGNOSIS — Z7982 Long term (current) use of aspirin: Secondary | ICD-10-CM | POA: Insufficient documentation

## 2014-12-07 DIAGNOSIS — I25709 Atherosclerosis of coronary artery bypass graft(s), unspecified, with unspecified angina pectoris: Secondary | ICD-10-CM | POA: Diagnosis present

## 2014-12-07 DIAGNOSIS — E785 Hyperlipidemia, unspecified: Secondary | ICD-10-CM | POA: Insufficient documentation

## 2014-12-07 DIAGNOSIS — Z955 Presence of coronary angioplasty implant and graft: Secondary | ICD-10-CM | POA: Diagnosis not present

## 2014-12-07 LAB — BASIC METABOLIC PANEL
Anion gap: 3 — ABNORMAL LOW (ref 5–15)
BUN: 17 mg/dL (ref 6–20)
CO2: 28 mmol/L (ref 22–32)
CREATININE: 1.26 mg/dL — AB (ref 0.61–1.24)
Calcium: 8.8 mg/dL — ABNORMAL LOW (ref 8.9–10.3)
Chloride: 111 mmol/L (ref 101–111)
GFR, EST NON AFRICAN AMERICAN: 55 mL/min — AB (ref >60)
Glucose, Bld: 105 mg/dL — ABNORMAL HIGH (ref 65–99)
POTASSIUM: 4.2 mmol/L (ref 3.5–5.1)
SODIUM: 142 mmol/L (ref 135–145)

## 2014-12-07 LAB — TROPONIN I
Troponin I: <0.03 ng/mL (ref <0.031)
Troponin I: <0.03 ng/mL (ref <0.031)

## 2014-12-07 LAB — CBC
HEMATOCRIT: 46.2 % (ref 40.0–52.0)
Hemoglobin: 15 g/dL (ref 13.0–18.0)
MCH: 28 pg (ref 26.0–34.0)
MCHC: 32.5 g/dL (ref 32.0–36.0)
MCV: 86.3 fL (ref 80.0–100.0)
PLATELETS: 177 10*3/uL (ref 150–440)
RBC: 5.36 MIL/uL (ref 4.40–5.90)
RDW: 18.4 % — AB (ref 11.5–14.5)
WBC: 4.8 10*3/uL (ref 3.8–10.6)

## 2014-12-07 MED ORDER — ENOXAPARIN SODIUM 80 MG/0.8ML ~~LOC~~ SOLN
1.0000 mg/kg | Freq: Two times a day (BID) | SUBCUTANEOUS | Status: DC
  Administered 2014-12-07 – 2014-12-08 (×3): 80 mg via SUBCUTANEOUS
  Filled 2014-12-07 (×5): qty 0.8

## 2014-12-07 MED ORDER — MORPHINE SULFATE (PF) 4 MG/ML IV SOLN
4.0000 mg | Freq: Once | INTRAVENOUS | Status: AC
  Administered 2014-12-07: 4 mg via INTRAVENOUS
  Filled 2014-12-07: qty 1

## 2014-12-07 MED ORDER — METOPROLOL TARTRATE 25 MG PO TABS
25.0000 mg | ORAL_TABLET | Freq: Two times a day (BID) | ORAL | Status: DC
  Administered 2014-12-07 – 2014-12-08 (×2): 25 mg via ORAL
  Filled 2014-12-07 (×2): qty 1

## 2014-12-07 MED ORDER — NITROGLYCERIN 0.4 MG SL SUBL: 0.4000 mg | SUBLINGUAL_TABLET | SUBLINGUAL | Status: DC | PRN

## 2014-12-07 MED ORDER — ATORVASTATIN CALCIUM 20 MG PO TABS
80.0000 mg | ORAL_TABLET | Freq: Every day | ORAL | Status: DC
  Administered 2014-12-07: 80 mg via ORAL
  Filled 2014-12-07 (×3): qty 4

## 2014-12-07 MED ORDER — NITROGLYCERIN 2 % TD OINT
1.0000 [in_us] | TOPICAL_OINTMENT | Freq: Once | TRANSDERMAL | Status: AC
  Administered 2014-12-07: 1 [in_us] via TOPICAL
  Filled 2014-12-07: qty 1

## 2014-12-07 MED ORDER — ONDANSETRON HCL 4 MG/2ML IJ SOLN: 4.0000 mg | Freq: Four times a day (QID) | INTRAMUSCULAR | Status: DC | PRN

## 2014-12-07 MED ORDER — ACETAMINOPHEN 325 MG PO TABS
650.0000 mg | ORAL_TABLET | ORAL | Status: DC | PRN
Start: 2014-12-07 — End: 2014-12-08
  Administered 2014-12-07 (×2): 650 mg via ORAL
  Filled 2014-12-07 (×2): qty 2

## 2014-12-07 MED ORDER — ZOLPIDEM TARTRATE 5 MG PO TABS
5.0000 mg | ORAL_TABLET | Freq: Every evening | ORAL | Status: DC | PRN
Start: 2014-12-07 — End: 2014-12-08
  Administered 2014-12-07: 5 mg via ORAL
  Filled 2014-12-07: qty 1

## 2014-12-07 MED ORDER — GABAPENTIN 300 MG PO CAPS
600.0000 mg | ORAL_CAPSULE | Freq: Every day | ORAL | Status: DC
  Administered 2014-12-07: 600 mg via ORAL
  Filled 2014-12-07: qty 2

## 2014-12-07 MED ORDER — ASPIRIN EC 325 MG PO TBEC: 325.0000 mg | DELAYED_RELEASE_TABLET | Freq: Every day | ORAL | Status: DC

## 2014-12-07 MED ORDER — SODIUM CHLORIDE 0.9 % IV SOLN
INTRAVENOUS | Status: DC
  Administered 2014-12-07 – 2014-12-08 (×2): via INTRAVENOUS

## 2014-12-07 MED ORDER — APIXABAN 5 MG PO TABS: 5.0000 mg | ORAL_TABLET | Freq: Two times a day (BID) | ORAL | Status: DC

## 2014-12-07 MED ORDER — ENOXAPARIN SODIUM 40 MG/0.4ML ~~LOC~~ SOLN: 40.0000 mg | SUBCUTANEOUS | Status: DC

## 2014-12-07 MED ORDER — ONDANSETRON HCL 4 MG/2ML IJ SOLN
4.0000 mg | Freq: Once | INTRAMUSCULAR | Status: AC
  Administered 2014-12-07: 4 mg via INTRAVENOUS
  Filled 2014-12-07: qty 2

## 2014-12-07 MED ORDER — ASPIRIN EC 81 MG PO TBEC
81.0000 mg | DELAYED_RELEASE_TABLET | Freq: Every day | ORAL | Status: DC
  Administered 2014-12-07: 81 mg via ORAL
  Filled 2014-12-07: qty 1

## 2014-12-07 MED ORDER — NITROGLYCERIN 0.4 MG SL SUBL
0.4000 mg | SUBLINGUAL_TABLET | SUBLINGUAL | Status: DC | PRN
  Administered 2014-12-07 (×3): 0.4 mg via SUBLINGUAL
  Filled 2014-12-07: qty 1

## 2014-12-07 NOTE — Progress Notes (Signed)
*  PRELIMINARY RESULTS* Echocardiogram 2D Echocardiogram has been performed.  Scott [name] 12/07/2014, 9:11 PM

## 2014-12-07 NOTE — Consult Note (Signed)
Cardiology Consultation Note  Patient ID: Scott [name], MRN: [id number], DOB/AGE: 1940/07/16 74 y.o. Admit date: 12/07/2014   Date of Consult: 12/07/2014 Primary Physician: Madelyn Brunner, MD Primary Cardiologist: Dr. Rockey Situ, MD  Chief Complaint: Chest pain Reason for Consult: Chest pain  HPI: 74 y.o. male with h/o CAD s/p CABG x 2 at [facility] in June 2016 with LIMA-LAD and SVG-OM s/p prior multiple stents most recently 03/2014 with PC/DES to prox LAD, ischemic cardiomyopathy with prior EF of 40-45% and most recent echo showing normal to normal LV systolic function in [id number], PAF on Eliquis, HTN, and HLD, who was recently seen in the [facility] ED in September for chest pain felt to be MSK in etiology presented to [facility] on 11/14 with return of chest pain.   Patient underwent cardiac cath back in April 2014 in the setting of SOB that showed severe RCA disease with left to right collaterals, subtotally occluded prox LCx, 50% prox LAD, 40% mid LAD, EF 35% by LV gram. At that time echo showed EF of 40-45%. Medical treatment was recommended. He underwent repeat cardiac cath in April 2015 that showed severe mid to distal RCA disease, severe subtotally occluded proximal LCx disease, left to right collaterals from the mid to distal LCx, mild to moderate prox LAD disease, EF 50%. Case was discussed with interventional cardiology and it was felt that he would be best served by intervention on his RCA rather than the LCx which was a smaller vessel. There were no significant targets for CABG and LAD grafting was not needed. He subsequently underwent a third cardiac cath in March 2016 in the setting of ongoing chest pain that showed LM 40% not significant by FFR (0.89), prox LAD 80% just after D1 s/p PCI/DES, distal LAD 40%, prox LCx 100% just after OM1 which was chronic total occlusion, OM2 diffuse 100% prox RCA 30% mid RCA there was stenosis at site of prior stent, distal RCA there was stenosis at site of prior stent. EF  50%.  He presented to [facility] 05/2014 with unstable angina. Underwent successful 2 vessel CABG with LIMA-LAD and SVG-OM. Post-op course was complicated by Afib/flutter, otherwise he did well and was pain free during his cardiac rehab. He has been on Eliquis 5 mg bid and Lopressor 25 mg for his Afib. He did present to the [facility] ED in 09/2014 with chest pain that was felt to be 2/2 MSK and was started on Tylenol and advised to follow up with Cardiology at [facility] as an outpatient. He followed up on 10/13/2014 and was doing well. Echo at that visit showed normal LV size and function with minimal MR. He was continued on Eliquis and and beta blocker.  He presented to [facility] on 11/14 after waking up with chest pain this morning. He reports this is unlike any prior chest pain. Pain is a severe pressure-like sensation. Feels like a heavy weight is sitting on his chest. He took a SL NTG at 2 AM and 4 AM and felt better. The prior evening he also took a SL NTG 2/2 chest pressure. This morning he called the cardiologist's office at [facility] and they advised him to come to the ED, so he came to [facility]. He reports associated SOB, diaphoresis, and nausea. Also possibly some associated left shoulder pain as when he went to sleep on 11/13 his shoulder was not hurting and when he got up it was. He denies any associated exertional chest pain. He graduated from cardiac rehab  and has been walking 1 mile at home 3 times weekly without issues.   Upon the patient's arrival to [facility] they were found to have troponin negative x 1, unremarkable CBC, SCr 1.26, K+ 4.2. ECG showed NSR, 70 bpm, left axis deviation, inferior Q waves, consider anterior infarct, CXR showed mild cardiomegaly with no pulmonary edema. No active cardiopulmonary disease. He was admitted, continued on Nitropaste, beta blocker, aspirin, statin, and started on Lovenox 1mg /kg bid. Cardiology consult was placed. Currently, without pain.    Past Medical History  Diagnosis Date  .  Cardiomyopathy, ischemic     a. 04/2012 Echo: EF 40-45%;  b. 08/2013 Echo: EF 45-50%, mild glob HK, mod lat/post HK, diast dysfxn, mildly dil LA, mild MR, nl RVSP.  Marland Kitchen Hyperlipidemia   . Carotid arterial disease ([place])     a. 05/2013 Carotid U/S; bilat 40-50% ICA stenosis.  Marland Kitchen CAD (coronary artery disease)     a. 04/2012 Cath: 3VD->Med Rx;  b. 04/2013 Cath/PCI: LAD 50p, D1 40, LCX 80p, OM2 99diff, RCA 70p, 61m, 95d. Mid & distal RCA treated with 2 DES.      Most Recent Cardiac Studies: Echo 10/13/2014: INTERPRETATION --------------------------------------------------------------- NORMAL LEFT VENTRICULAR SYSTOLIC FUNCTION WITH MILD LVH NORMAL LA PRESSURES WITH DIASTOLIC DYSFUNCTION NORMAL RIGHT VENTRICULAR SYSTOLIC FUNCTION VALVULAR REGURGITATION: TRIVIAL MR, TRIVIAL PR, TRIVIAL TR NO VALVULAR STENOSIS Compared with prior Echo study on 05/29/2014: NO SIGNIFICANT CHANGES.   Surgical History:  Past Surgical History  Procedure Laterality Date  . Right shoulder    . Carpal tunnel release      right hand  . Tonsillectomy    . Cardiac catheterization  05/01/2012  . Cardiac catheterization  04/2013    [facility];x3 stent  . Coronary angioplasty with stent placement  04/13/2014  . Coronary artery bypass graft  06-26-14  . Cataract extraction w/phaco Left 09/16/2014    Procedure: CATARACT EXTRACTION PHACO AND INTRAOCULAR LENS PLACEMENT (IOC);  Surgeon: Leandrew Koyanagi, MD;  Location: [place];  Service: Ophthalmology;  Laterality: Left;     Home Meds: Prior to Admission medications   Medication Sig Start Date End Date Taking? Authorizing Provider  apixaban (ELIQUIS) 5 MG TABS tablet Take 5 mg by mouth 2 (two) times daily.   Yes Historical Provider, MD  aspirin EC 81 MG tablet Take 1 tablet (81 mg total) by mouth daily. Patient taking differently: Take 81 mg by mouth at bedtime.  05/17/13  Yes Wellington Hampshire, MD  atorvastatin (LIPITOR) 80 MG tablet Take 80 mg by mouth at bedtime.   Yes  Historical Provider, MD  gabapentin (NEURONTIN) 300 MG capsule Take 600 mg by mouth at bedtime.    Yes Historical Provider, MD  metoprolol tartrate (LOPRESSOR) 25 MG tablet Take 25 mg by mouth 2 (two) times daily.   Yes Historical Provider, MD  nitroGLYCERIN (NITROSTAT) 0.4 MG SL tablet Place 1 tablet (0.4 mg total) under the tongue every 5 (five) minutes as needed for chest pain. 09/05/13  Yes Minna Merritts, MD  zolpidem (AMBIEN) 5 MG tablet Take 5 mg by mouth at bedtime as needed for sleep.    Yes Historical Provider, MD  prasugrel (EFFIENT) 10 MG TABS tablet Take 1 tablet (10 mg total) by mouth daily. Patient not taking: Reported on 09/10/2014 06/09/14   Minna Merritts, MD    Inpatient Medications:  . apixaban  5 mg Oral BID  . aspirin EC  325 mg Oral Daily  . aspirin EC  81 mg Oral  QHS  . atorvastatin  80 mg Oral QHS  . enoxaparin (LOVENOX) injection  1 mg/kg Subcutaneous Q12H  . gabapentin  600 mg Oral QHS  . metoprolol tartrate  25 mg Oral BID   . sodium chloride      Allergies:  Allergies  Allergen Reactions  . Contrast Media  [Iodinated Diagnostic Agents] Hives  . Oxycodone Hcl Itching    Social History   Social History  . Marital Status: Married    Spouse Name: N/A  . Number of Children: N/A  . Years of Education: N/A   Occupational History  . Not on file.   Social History Main Topics  . Smoking status: Former Smoker -- 1.00 packs/day for 35 years    Types: Cigarettes    Start date: 05/02/2009  . Smokeless tobacco: Never Used  . Alcohol Use: No  . Drug Use: No  . Sexual Activity: Yes   Other Topics Concern  . Not on file   Social History Narrative     Family History  Problem Relation Age of Onset  . Heart attack Father 69    MI     Review of Systems: Review of Systems  Constitutional: Positive for weight loss, malaise/fatigue and diaphoresis. Negative for fever and chills.  HENT: Negative for congestion.   Eyes: Negative for discharge and  redness.  Respiratory: Positive for shortness of breath. Negative for cough, hemoptysis, sputum production and wheezing.   Cardiovascular: Positive for chest pain and palpitations. Negative for orthopnea, claudication, leg swelling and PND.       Substernal chest pain radiating to the left shoulder  Gastrointestinal: Positive for nausea. Negative for heartburn, vomiting, abdominal pain, diarrhea, constipation, blood in stool and melena.  Musculoskeletal: Positive for joint pain. Negative for myalgias, back pain, falls and neck pain.       Left shoulder, has been a chronic issue in the past  Skin: Negative for rash.  Neurological: Positive for weakness and headaches. Negative for dizziness, tingling, tremors, sensory change, speech change, focal weakness and loss of consciousness.  Endo/Heme/Allergies: Does not bruise/bleed easily.  Psychiatric/Behavioral: Negative for substance abuse. The patient is not nervous/anxious.   All other systems reviewed and are negative.   Labs:  Recent Labs  12/07/14 0923  TROPONINI <0.03   Lab Results  Component Value Date   WBC 4.8 12/07/2014   HGB 15.0 12/07/2014   HCT 46.2 12/07/2014   MCV 86.3 12/07/2014   PLT 177 12/07/2014    Recent Labs Lab 12/07/14 0923  NA 142  K 4.2  CL 111  CO2 28  BUN 17  CREATININE 1.26*  CALCIUM 8.8*  GLUCOSE 105*   Lab Results  Component Value Date   CHOL 122 12/15/2013   HDL 51 12/15/2013   LDLCALC 56 12/15/2013   TRIG 76 12/15/2013   No results found for: DDIMER  Radiology/Studies:  Dg Chest Port 1 View  12/07/2014  CLINICAL DATA:  Chest pain EXAM: PORTABLE CHEST 1 VIEW COMPARISON:  04/04/2014 chest radiograph. FINDINGS: Median sternotomy wires are aligned and intact. CABG clips overlie the mediastinum. There is mild cardiomegaly. Otherwise stable mediastinal contour with mild tortuosity of the thoracic aorta. No pneumothorax. No pleural effusion. Clear lungs, with no focal lung consolidation and no  pulmonary edema. IMPRESSION: Mild cardiomegaly, with no pulmonary edema. No active pulmonary disease. Electronically Signed   By: Ilona Sorrel M.D.   On: 12/07/2014 11:11    EKG: NSR, 70 bpm, left axis deviation, inferior Q  waves, consider anterior infarct  Weights: Filed Weights   12/07/14 0927 12/07/14 1416  Weight: 179 lb (81.194 kg) 172 lb 3.2 oz (78.109 kg)     Physical Exam: Blood pressure 113/55, pulse 65, temperature 97.8 F (36.6 C), temperature source Oral, resp. rate 17, height 5\' 10"  (1.778 m), weight 172 lb 3.2 oz (78.109 kg), SpO2 100 %. Body mass index is 24.71 kg/(m^2). General: Well developed, well nourished, in no acute distress. Head: Normocephalic, atraumatic, sclera non-icteric, no xanthomas, nares are without discharge.  Neck: Negative for carotid bruits. JVD not elevated. Lungs: Clear bilaterally to auscultation without wheezes, rales, or rhonchi. Breathing is unlabored. Heart: RRR with S1 S2. No murmurs, rubs, or gallops appreciated. Abdomen: Soft, non-tender, non-distended with normoactive bowel sounds. No hepatomegaly. No rebound/guarding. No obvious abdominal masses. Msk:  Strength and tone appear normal for age. Extremities: No clubbing or cyanosis. No edema.  Distal pedal pulses are 2+ and equal bilaterally. Neuro: Alert and oriented X 3. No facial asymmetry. No focal deficit. Moves all extremities spontaneously. Psych:  Responds to questions appropriately with a normal affect.    Assessment and Plan:  74 y.o. male with h/o CAD s/p CABG x 2 at [facility] in June 2016 with LIMA-LAD and SVG-OM s/p prior multiple stents most recently 03/2014 with PC/DES to prox LAD, ischemic cardiomyopathy with prior EF of 40-45% and most recent echo showing normal to normal LV systolic function in [id number], PAF on Eliquis, HTN, and HLD, who was recently seen in the [facility] ED in September for chest pain felt to be MSK in etiology presented to [facility] on 11/14 with return of chest pain.  1.  Atypical chest pain/CAD s/p recent 2 vessel CABG in June 2016 s/p recent PCI/DES 03/2014 as above: -Of uncertain etiology at this time given his non-exertional presenting symptoms and recent revascularization 6 months prior  -Troponin negative x 2 thus far, cycle for final time later tonight per timed schedule to rule out MI -Check echo to evaluate for newly depressed LV function, changes in wall motion, or elevated right-sided pressure -Schedule Lexiscan Myoview to evaluate for high risk ischemia  -If normal/unchanged consider non-cardiac etiologies  -Continue aspirin 81 mg, Lopressor 25 mg bid, and Lipitor 80 mg -Effient is listed as a prior to home medication, he in fact is no longer taking this as this was discontinued post bypass (LIMA-LAD), Effient is not indicated in bypass patients. Neither Plavix or Brilinta are indicated in him given his presentation for bypass was unstable angina back in June. Also, would be hesitant to place him on triple therapy at this time.   2. Ischemic cardiomyopathy: -Last echo on 9/20 showed return to normal LV systolic function  -Check echo as above -Continue Lopressor 25 mg bid  3. PAF: -Currently in sinus rhythm with a heart rate in the 70's -Lopressor 25 mg bid -Eliquis on hold 2/2 patient being on Lovenox 1 mg/kg -If patient rules out would look to restart Eliquis 5 mg bid and stop full dose Lovenox  -CHADSVASc at least 4 (CHF, HTN, age x 1, vascular disease), giving him an estimated annual stroke risk of 4.0% -Dual therapy as above  4. HTN: -Controlled -Continue Lopressor 25 mg bid  5. HLD: -Lipitor 80 mg daily -Goal LDL < 70    SignedChristell Faith, PA-C Pager: [phone] 12/07/2014, 3:13 PM

## 2014-12-07 NOTE — Plan of Care (Signed)
Problem: Education: Goal: Knowledge of  General Education information/materials will improve Outcome: Progressing Admission booklet given to pt  Problem: Safety: Goal: Ability to remain free from injury will improve Outcome: Progressing Fall precautions in place  Problem: Nutrition: Goal: Adequate nutrition will be maintained Outcome: Progressing Heart healthy diet  Problem: Phase I Progression Outcomes Goal: Aspirin unless contraindicated Outcome: Progressing Ordered Goal: MD aware of Cardiac Marker results Outcome: Progressing Cycling cardiac markers

## 2014-12-07 NOTE — ED Notes (Signed)
Last pm developed chest tightness  Then early am develped 2 episodes took 2 ntg with some relief, also has some in left sholder

## 2014-12-07 NOTE — Progress Notes (Signed)
74 yo wm admitted to room 237 from ED with chest pain.  A&O x3, slightly unsteady on feet at this time, pt encouraged to call for assist getting oob.  No distress on ra.  Cardiac monitor placed on pt, pt states chest pain 2/10, denies need for medication at this time.  Lungs clear bil.  Abdomen benign. SL lt ac flushes well.  Oriented to room and surroundings, POC reviewed with pt.  Skin assessment checked with Junious Dresser, RN.  No open areas noted.  Denies need.  CB in reach, SR up x 2, bed alarm on.

## 2014-12-07 NOTE — H&P (Addendum)
[place] [facility] [place] NAME: Scott [name]    MR#:  [id number]  DATE OF BIRTH:  06-22-1940  DATE OF ADMISSION:  12/07/2014  PRIMARY CARE PHYSICIAN: Madelyn Brunner, MD   REQUESTING/REFERRING PHYSICIAN: Dr. Corky Downs  CHIEF COMPLAINT:  Chest pain since 2:00 this morning.  HISTORY OF PRESENT ILLNESS:  Scott [name]  is a 74 y.o. male with a known history of coronary artery disease status post CABG 2 in June 2016, cardiac stents 4 in the past, cardiomyopathy with EF of 40-45% but recent echo done at [facility], [place] showed normal LV function, hypertension, paroxysmal atrial fibrillation on by mouth anticoagulation consisted emergency room after he woke up at 2:00 in the morning with chest pain. Patient states he felt somebody sitting on his chest. He took a nitroglycerin and he later felt better. Prior to going to bed last night he took another nitroglycerin because of having some chest pain earlier. Woke up this morning called a cardiac nurse and since his blood pressure was elevated with ongoing chest pain he was asked to come to the emergency room. In the ER patient has been hematocrit stable he received morphine and Nitropaste. His cardiac headache. His chest pain is 3/10. EKG does not show any acute ST elevation or depression. We'll admit him for further evaluation of his chest pain. Facet of cardiac enzyme is negative.  PAST MEDICAL HISTORY:   Past Medical History  Diagnosis Date  . Cardiomyopathy, ischemic     a. 04/2012 Echo: EF 40-45%;  b. 08/2013 Echo: EF 45-50%, mild glob HK, mod lat/post HK, diast dysfxn, mildly dil LA, mild MR, nl RVSP.  Marland Kitchen Hyperlipidemia   . Carotid arterial disease ([place])     a. 05/2013 Carotid U/S; bilat 40-50% ICA stenosis.  Marland Kitchen CAD (coronary artery disease)     a. 04/2012 Cath: 3VD->Med Rx;  b. 04/2013 Cath/PCI: LAD 50p, D1 40, LCX 80p, OM2 99diff, RCA 70p, 76m, 95d. Mid & distal RCA treated with 2 DES.    PAST SURGICAL  HISTOIRY:   Past Surgical History  Procedure Laterality Date  . Right shoulder    . Carpal tunnel release      right hand  . Tonsillectomy    . Cardiac catheterization  05/01/2012  . Cardiac catheterization  04/2013    [facility];x3 stent  . Coronary angioplasty with stent placement  04/13/2014  . Coronary artery bypass graft  06-26-14  . Cataract extraction w/phaco Left 09/16/2014    Procedure: CATARACT EXTRACTION PHACO AND INTRAOCULAR LENS PLACEMENT (IOC);  Surgeon: Leandrew Koyanagi, MD;  Location: [place];  Service: Ophthalmology;  Laterality: Left;    SOCIAL HISTORY:   Social History  Substance Use Topics  . Smoking status: Former Smoker -- 1.00 packs/day for 35 years    Types: Cigarettes    Start date: 05/02/2009  . Smokeless tobacco: Never Used  . Alcohol Use: No    FAMILY HISTORY:   Family History  Problem Relation Age of Onset  . Heart attack Father 51    MI    DRUG ALLERGIES:   Allergies  Allergen Reactions  . Contrast Media  [Iodinated Diagnostic Agents] Hives  . Oxycodone Hcl Itching    REVIEW OF SYSTEMS:  Review of Systems  Constitutional: Positive for diaphoresis. Negative for fever, chills and weight loss.  HENT: Negative for ear discharge, ear pain and nosebleeds.   Eyes: Negative for blurred vision, pain and discharge.  Respiratory: Negative for  sputum production, shortness of breath, wheezing and stridor.   Cardiovascular: Positive for chest pain. Negative for palpitations, orthopnea and PND.  Gastrointestinal: Negative for nausea, vomiting, abdominal pain and diarrhea.  Genitourinary: Negative for urgency and frequency.  Musculoskeletal: Negative for back pain and joint pain.  Neurological: Negative for sensory change, speech change, focal weakness and weakness.  Psychiatric/Behavioral: Negative for depression and hallucinations. The patient is not nervous/anxious.   All other systems reviewed and are negative.    MEDICATIONS AT HOME:    Prior to Admission medications   Medication Sig Start Date End Date Taking? Authorizing Provider  apixaban (ELIQUIS) 5 MG TABS tablet Take 5 mg by mouth 2 (two) times daily.   Yes Historical Provider, MD  aspirin EC 81 MG tablet Take 1 tablet (81 mg total) by mouth daily. Patient taking differently: Take 81 mg by mouth at bedtime.  05/17/13  Yes Wellington Hampshire, MD  atorvastatin (LIPITOR) 80 MG tablet Take 80 mg by mouth at bedtime.   Yes Historical Provider, MD  gabapentin (NEURONTIN) 300 MG capsule Take 600 mg by mouth at bedtime.    Yes Historical Provider, MD  metoprolol tartrate (LOPRESSOR) 25 MG tablet Take 25 mg by mouth 2 (two) times daily.   Yes Historical Provider, MD  nitroGLYCERIN (NITROSTAT) 0.4 MG SL tablet Place 1 tablet (0.4 mg total) under the tongue every 5 (five) minutes as needed for chest pain. 09/05/13  Yes Minna Merritts, MD  zolpidem (AMBIEN) 5 MG tablet Take 5 mg by mouth at bedtime as needed for sleep.    Yes Historical Provider, MD  prasugrel (EFFIENT) 10 MG TABS tablet Take 1 tablet (10 mg total) by mouth daily. Patient not taking: Reported on 09/10/2014 06/09/14   Minna Merritts, MD      VITAL SIGNS:  Blood pressure 128/69, pulse 60, temperature 98.4 F (36.9 C), temperature source Oral, resp. rate 12, height 5\' 10"  (1.778 m), weight 81.194 kg (179 lb), SpO2 99 %.  PHYSICAL EXAMINATION:  GENERAL:  74 y.o.-year-old patient lying in the bed with no acute distress.  EYES: Pupils equal, round, reactive to light and accommodation. No scleral icterus. Extraocular muscles intact.  HEENT: Head atraumatic, normocephalic. Oropharynx and nasopharynx clear.  NECK:  Supple, no jugular venous distention. No thyroid enlargement, no tenderness.  LUNGS: Normal breath sounds bilaterally, no wheezing, rales,rhonchi or crepitation. No use of accessory muscles of respiration.  CARDIOVASCULAR: S1, S2 normal. No murmurs, rubs, or gallops.  ABDOMEN: Soft, nontender, nondistended.  Bowel sounds present. No organomegaly or mass.  EXTREMITIES: No pedal edema, cyanosis, or clubbing.  NEUROLOGIC: Cranial nerves II through XII are intact. Muscle strength 5/5 in all extremities. Sensation intact. Gait not checked.  PSYCHIATRIC: The patient is alert and oriented x 3.  SKIN: No obvious rash, lesion, or ulcer.   LABORATORY PANEL:   CBC  Recent Labs Lab 12/07/14 0923  WBC 4.8  HGB 15.0  HCT 46.2  PLT 177   ------------------------------------------------------------------------------------------------------------------  Chemistries   Recent Labs Lab 12/07/14 0923  NA 142  K 4.2  CL 111  CO2 28  GLUCOSE 105*  BUN 17  CREATININE 1.26*  CALCIUM 8.8*   ------------------------------------------------------------------------------------------------------------------  Cardiac Enzymes  Recent Labs Lab 12/07/14 0923  TROPONINI <0.03   ------------------------------------------------------------------------------------------------------------------  RADIOLOGY:  Dg Chest Port 1 View  12/07/2014  CLINICAL DATA:  Chest pain EXAM: PORTABLE CHEST 1 VIEW COMPARISON:  04/04/2014 chest radiograph. FINDINGS: Median sternotomy wires are aligned and intact. CABG clips overlie  the mediastinum. There is mild cardiomegaly. Otherwise stable mediastinal contour with mild tortuosity of the thoracic aorta. No pneumothorax. No pleural effusion. Clear lungs, with no focal lung consolidation and no pulmonary edema. IMPRESSION: Mild cardiomegaly, with no pulmonary edema. No active pulmonary disease. Electronically Signed   By: Ilona Sorrel M.D.   On: 12/07/2014 11:11    EKG:  Normal sinus rhythm. No acute ST elevation or depression. Q waves in inferior leads.  IMPRESSION AND PLAN:   Scott [name]  is a 74 y.o. male with a known history of coronary artery disease status post CABG 2 in June 2016, cardiac stents 4 in the past, cardiomyopathy with EF of 40-45% but recent echo done  at [facility] showed normal LV function, hypertension, paroxysmal atrial fibrillation on by mouth anticoagulation consisted emergency room after he woke up at 2:00 in the morning with chest pain.  1. Unstable angina/chest pain Admit to telemetry floor Continue Nitropaste, beta blockers, aspirin, atorvastatin Cycle cardiac enzymes 3 Cardiac consultation with Dr.arida Lovenox 1mg /kg bid  2. Coronary artery disease status post CABG 2 in June 2016 Patient has had 4 stents in the past Treatment as above  3. Hyperlipidemia continue Lipitor  4. Hypertension Continue beta blockers  5. DVT prophylaxis patient already on Lovenox.  6. History of paroxysmal A. fib. Patient is on chronic anticoagulation with Eliquis. Will hold Eliquis since patient currently is on treatment doses of Lovenox.  All the records are reviewed and case discussed with ED provider. Management plans discussed with the patient, family and they are in agreement.  CODE STATUS: Full  TOTAL TIME TAKING CARE OF THIS PATIENT: 50 minutes.    Scott [name] M.D on 12/07/2014 at 1:19 PM  Between 7am to 6pm - Pager - [phone]  After 6pm go to [email or web address] - password [email or web address] [facility] [facility]  Office  [phone]  CC: Primary care physician; Madelyn Brunner, MD

## 2014-12-07 NOTE — ED Provider Notes (Signed)
[facility] Emergency Department Provider Note   ____________________________________________  Time seen: 10:15 AM I have reviewed the triage vital signs and the triage nursing note.  HISTORY  Chief Complaint Chest Pain   Historian Patient and spouse  HPI Scott [name] is a 74 y.o. male who is a history of coronary artery disease, as well as CABG in June of this year, who is presenting with chest discomfort which is described as pressure centrally since 2 AM. Patient was at rest. He tried sublingual nitroglycerin and tried a second one at around 4 AM. This provided some relief. He still having some discomfort over the left shoulder. He states that approximately 5 out of 10. No shortness of breath. No cough. No fever. No dizziness or passing out.  Cardiologist Dr. Rockey Situ.    Past Medical History  Diagnosis Date  . Cardiomyopathy, ischemic     a. 04/2012 Echo: EF 40-45%;  b. 08/2013 Echo: EF 45-50%, mild glob HK, mod lat/post HK, diast dysfxn, mildly dil LA, mild MR, nl RVSP.  Marland Kitchen Hyperlipidemia   . Carotid arterial disease ([place])     a. 05/2013 Carotid U/S; bilat 40-50% ICA stenosis.  Marland Kitchen CAD (coronary artery disease)     a. 04/2012 Cath: 3VD->Med Rx;  b. 04/2013 Cath/PCI: LAD 50p, D1 40, LCX 80p, OM2 99diff, RCA 70p, 55m, 95d. Mid & distal RCA treated with 2 DES.    Patient Active Problem List   Diagnosis Date Noted  . Anemia 04/20/2014  . 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 Cardiomyopathy, ischemic   . HTN (hypertension)   . Carotid arterial disease ([place])   . S/P drug eluting coronary stent placement 05/30/2013  . Chest pain 05/05/2013  . SOB (shortness of breath) 05/05/2013  . CAD (coronary artery disease) 05/05/2013  . Hyperlipidemia 05/05/2013  . History of smoking 05/05/2013  . Cardiac angina ([place]) 05/05/2013    Past Surgical History  Procedure Laterality Date  . Right shoulder    . Carpal tunnel release      right hand  . Tonsillectomy     . Cardiac catheterization  05/01/2012  . Cardiac catheterization  04/2013    [facility];x3 stent  . Coronary angioplasty with stent placement  04/13/2014  . Coronary artery bypass graft  06-26-14  . Cataract extraction w/phaco Left 09/16/2014    Procedure: CATARACT EXTRACTION PHACO AND INTRAOCULAR LENS PLACEMENT (IOC);  Surgeon: Leandrew Koyanagi, MD;  Location: [place];  Service: Ophthalmology;  Laterality: Left;    Current Outpatient Rx  Name  Route  Sig  Dispense  Refill  . apixaban (ELIQUIS) 5 MG TABS tablet   Oral   Take 5 mg by mouth 2 (two) times daily.         Marland Kitchen aspirin EC 81 MG tablet   Oral   Take 1 tablet (81 mg total) by mouth daily. Patient taking differently: Take 81 mg by mouth at bedtime.          Marland Kitchen atorvastatin (LIPITOR) 80 MG tablet   Oral   Take 80 mg by mouth at bedtime.         . gabapentin (NEURONTIN) 300 MG capsule   Oral   Take 600 mg by mouth at bedtime.          . metoprolol tartrate (LOPRESSOR) 25 MG tablet   Oral   Take 25 mg by mouth 2 (two) times daily.         . nitroGLYCERIN (NITROSTAT) 0.4 MG SL  tablet   Sublingual   Place 1 tablet (0.4 mg total) under the tongue every 5 (five) minutes as needed for chest pain.   90 tablet   3   . zolpidem (AMBIEN) 5 MG tablet   Oral   Take 5 mg by mouth at bedtime as needed for sleep.          . prasugrel (EFFIENT) 10 MG TABS tablet   Oral   Take 1 tablet (10 mg total) by mouth daily. Patient not taking: Reported on 09/10/2014   30 tablet   11     Allergies Contrast media  and Oxycodone hcl  Family History  Problem Relation Age of Onset  . Heart attack Father 3    MI    Social History Social History  Substance Use Topics  . Smoking status: Former Smoker -- 1.00 packs/day for 35 years    Types: Cigarettes    Start date: 05/02/2009  . Smokeless tobacco: Never Used  . Alcohol Use: No    Review of Systems  Constitutional: Negative for fever. Eyes: Negative for visual  changes. ENT: Negative for sore throat. Cardiovascular: Negative for palpitations Respiratory: Negative for shortness of breath. Gastrointestinal: Negative for abdominal pain, vomiting and diarrhea. Genitourinary: Negative for dysuria. Musculoskeletal: Negative for back pain. Skin: Negative for rash. Neurological: Negative for headache. 10 point Review of Systems otherwise negative ____________________________________________   PHYSICAL EXAM:  VITAL SIGNS: ED Triage Vitals  Enc Vitals Group     BP 12/07/14 0931 98/78 mmHg     Pulse Rate 12/07/14 0927 67     Resp 12/07/14 0927 20     Temp 12/07/14 0927 98.4 F (36.9 C)     Temp Source 12/07/14 0927 Oral     SpO2 12/07/14 0927 100 %     Weight 12/07/14 0927 179 lb (81.194 kg)     Height 12/07/14 0927 5\' 10"  (1.778 m)     Head Cir --      Peak Flow --      Pain Score 12/07/14 0928 7     Pain Loc --      Pain Edu? --      Excl. in [place]? --      Constitutional: Alert and oriented. Well appearing and in no distress. Eyes: Conjunctivae are normal. PERRL. Normal extraocular movements. ENT   Head: Normocephalic and atraumatic.   Nose: No congestion/rhinnorhea.   Mouth/Throat: Mucous membranes are moist.   Neck: No stridor. Cardiovascular/Chest: Normal rate, regular rhythm.  No murmurs, rubs, or gallops. Respiratory: Normal respiratory effort without tachypnea nor retractions. Breath sounds are clear and equal bilaterally. No wheezes/rales/rhonchi. Gastrointestinal: Soft. No distention, no guarding, no rebound. Nontender   Genitourinary/rectal:Deferred Musculoskeletal: Nontender with normal range of motion in all extremities. No joint effusions.  No lower extremity tenderness.  No edema. Neurologic:  Normal speech and language. No gross or focal neurologic deficits are appreciated. Skin:  Skin is warm, dry and intact. No rash noted. Psychiatric: Mood and affect are normal. Speech and behavior are normal. Patient  exhibits appropriate insight and judgment.  ____________________________________________   EKG I, Lisa Roca, MD, the attending physician have personally viewed and interpreted all ECGs.  70 bpm. Normal sinus rhythm. Left Axis deviation. Nonspecific ST and T-wave. ____________________________________________  LABS (pertinent positives/negatives)  Basic metabolic panel significant for creatinine 1.06 and otherwise without significant abnormalities White blood count 4.8, hemoglobin 15 and platelet count 137 Troponin less than 0.03  ____________________________________________  RADIOLOGY All Xrays were  viewed by me. Imaging interpreted by Radiologist.  Chest x-ray portable:  IMPRESSION: Mild cardiomegaly, with no pulmonary edema. No active pulmonary disease. __________________________________________  PROCEDURES  Procedure(s) performed: None  Critical Care performed: None  ____________________________________________   ED COURSE / ASSESSMENT AND PLAN  CONSULTATIONS: Hospitalist for admission  Pertinent labs & imaging results that were available during my care of the patient were reviewed by me and considered in my medical decision making (see chart for details).   Patient with history of coronary artery disease and bypass is here with chest discomfort which could potentially be related to cardiac etiology. Additional nitroglycerin was given here with some improvement. Patient's EKG shows no ST elevation MI, it is nonspecific. His initial troponin is negative. Patient will be admitted given his underlying coronary artery disease history and complaint of chest discomfort.  Patient / Family / Caregiver informed of clinical course, medical decision-making process, and agree with plan.   ___________________________________________   FINAL CLINICAL IMPRESSION(S) / ED DIAGNOSES   Final diagnoses:  Chest pain, unspecified       Lisa Roca, MD 12/07/14 1219

## 2014-12-07 NOTE — Care Management Obs Status (Signed)
[facility] NOTIFICATION   Patient Details  Name: BETH BEDSWORTH MRN: [id number] Date of Birth: 1940/08/16   Medicare Observation Status Notification Given:  Yes Given to pt. Here in the ER.    Beau Fanny, RN 12/07/2014, 1:05 PM

## 2014-12-08 ENCOUNTER — Observation Stay (HOSPITAL_BASED_OUTPATIENT_CLINIC_OR_DEPARTMENT_OTHER): Payer: Medicare Other

## 2014-12-08 DIAGNOSIS — I480 Paroxysmal atrial fibrillation: Secondary | ICD-10-CM

## 2014-12-08 DIAGNOSIS — R0789 Other chest pain: Secondary | ICD-10-CM

## 2014-12-08 DIAGNOSIS — E785 Hyperlipidemia, unspecified: Secondary | ICD-10-CM

## 2014-12-08 DIAGNOSIS — R079 Chest pain, unspecified: Secondary | ICD-10-CM

## 2014-12-08 DIAGNOSIS — Z7901 Long term (current) use of anticoagulants: Secondary | ICD-10-CM

## 2014-12-08 DIAGNOSIS — I10 Essential (primary) hypertension: Secondary | ICD-10-CM | POA: Diagnosis not present

## 2014-12-08 DIAGNOSIS — I2510 Atherosclerotic heart disease of native coronary artery without angina pectoris: Secondary | ICD-10-CM | POA: Diagnosis not present

## 2014-12-08 DIAGNOSIS — I255 Ischemic cardiomyopathy: Secondary | ICD-10-CM | POA: Diagnosis not present

## 2014-12-08 DIAGNOSIS — I200 Unstable angina: Secondary | ICD-10-CM | POA: Diagnosis not present

## 2014-12-08 LAB — NM MYOCAR MULTI W/SPECT W/WALL MOTION / EF
CHL CUP MPHR: 148 {beats}/min
CHL CUP NUCLEAR SDS: 1
CHL CUP NUCLEAR SRS: 3
CHL CUP RESTING HR STRESS: 72 {beats}/min
CSEPHR: 53 %
Estimated workload: 1 METS
Exercise duration (min): 0 min
Exercise duration (sec): 0 s
LV sys vol: 50 mL
LVDIAVOL: 130 mL
Peak HR: 79 {beats}/min
SSS: 3
TID: 0.98

## 2014-12-08 MED ORDER — REGADENOSON 0.4 MG/5ML IV SOLN
0.4000 mg | Freq: Once | INTRAVENOUS | Status: AC
  Administered 2014-12-08: 0.4 mg via INTRAVENOUS

## 2014-12-08 MED ORDER — TECHNETIUM TC 99M SESTAMIBI - CARDIOLITE
10.0000 | Freq: Once | INTRAVENOUS | Status: AC | PRN
  Administered 2014-12-08: 13.46 via INTRAVENOUS

## 2014-12-08 MED ORDER — TECHNETIUM TC 99M SESTAMIBI - CARDIOLITE
30.0000 | Freq: Once | INTRAVENOUS | Status: AC | PRN
  Administered 2014-12-08: 09:00:00 32.275 via INTRAVENOUS

## 2014-12-08 NOTE — Care Management (Addendum)
Patient admitted from home with chest pain.  Patient stress test negative.  Patient lives at home with wife. Mobility at baseline.  Patient obtains his medications from [facility].  No discharge needs.

## 2014-12-08 NOTE — Progress Notes (Addendum)
Patient Name: Scott [name] Date of Encounter: 12/08/2014   Principal Problem:   Unstable angina [facility]) Active Problems:   CAD (coronary artery disease)   Cardiomyopathy, ischemic   HTN (hypertension)   Hyperlipidemia   PAF (paroxysmal atrial fibrillation) (H[facility])   Chronic anticoagulation    SUBJECTIVE  No chest pain overnight.  Trops neg.  For MV today.  CURRENT MEDS . aspirin EC  81 mg Oral QHS  . atorvastatin  80 mg Oral QHS  . enoxaparin (LOVENOX) injection  1 mg/kg Subcutaneous Q12H  . gabapentin  600 mg Oral QHS  . metoprolol tartrate  25 mg Oral BID    OBJECTIVE  Filed Vitals:   12/07/14 1416 12/07/14 1940 12/07/14 2157 12/08/14 0722  BP: 113/55 105/69 122/74 120/63  Pulse: 65 67 64 60  Temp: 97.8 F (36.6 C) 98.2 F (36.8 C)  97.8 F (36.6 C)  TempSrc: Oral Oral  Oral  Resp: 17 21  16   Height: 5\' 10"  (1.778 m)     Weight: 172 lb 3.2 oz (78.109 kg)     SpO2: 100% 97%  96%    Intake/Output Summary (Last 24 hours) at 12/08/14 0953 Last data filed at 12/08/14 0800  Gross per 24 hour  Intake   1020 ml  Output    350 ml  Net    670 ml   Filed Weights   12/07/14 0927 12/07/14 1416  Weight: 179 lb (81.194 kg) 172 lb 3.2 oz (78.109 kg)    PHYSICAL EXAM  General: Pleasant, NAD. Neuro: Alert and oriented X 3. Moves all extremities spontaneously. Psych: Normal affect. HEENT:  Normal  Neck: Supple without bruits or JVD. Lungs:  Resp regular and unlabored, CTA. Heart: RRR no s3, s4, or murmurs. Abdomen: Soft, non-tender, non-distended, BS + x 4.  Extremities: No clubbing, cyanosis or edema. DP/PT/Radials 2+ and equal bilaterally.  Accessory Clinical Findings  CBC  Recent Labs  12/07/14 0923  WBC 4.8  HGB 15.0  HCT 46.2  MCV 86.3  PLT 123XX123   Basic Metabolic Panel  Recent Labs  12/07/14 0923  NA 142  K 4.2  CL 111  CO2 28  GLUCOSE 105*  BUN 17  CREATININE 1.26*  CALCIUM 8.8*   Cardiac Enzymes  Recent Labs  12/07/14 0923  12/07/14 1439 12/07/14 2031  TROPONINI <0.03 <0.03 <0.03   TELE  Seen in nuc med - in sinus  Radiology/Studies  Dg Chest Port 1 View  12/07/2014  CLINICAL DATA:  Chest pain EXAM: PORTABLE CHEST 1 VIEW COMPARISON:  04/04/2014 chest radiograph. FINDINGS: Median sternotomy wires are aligned and intact. CABG clips overlie the mediastinum. There is mild cardiomegaly. Otherwise stable mediastinal contour with mild tortuosity of the thoracic aorta. No pneumothorax. No pleural effusion. Clear lungs, with no focal lung consolidation and no pulmonary edema. IMPRESSION: Mild cardiomegaly, with no pulmonary edema. No active pulmonary disease. Electronically Signed   By: Ilona Sorrel M.D.   On: 12/07/2014 11:11   2D Echocardiogram 11.14.2016  Study Conclusions  - Procedure narrative: Transthoracic echocardiography. Image quality was poor. The study was technically difficult, as a result of poor acoustic windows and poor sound wave transmission. - Left ventricle: The cavity size was mildly dilated. Systolic function was mildly reduced. The estimated ejection fraction was in the range of 45% to 50%. Several images suggesting hypokinesis of the basal to mid inferior myocardium. Left ventricular diastolic function parameters were normal. - Left atrium: The atrium was mildly dilated. - Right  ventricle: Systolic function was normal. - Pulmonary arteries: Systolic pressure was within the normal range. _____________   ASSESSMENT AND PLAN  74 y.o. male with h/o CAD s/p CABG x 2 at [facility] in June 2016 with LIMA-LAD and SVG-OM s/p prior multiple stents most recently 03/2014 with PC/DES to prox LAD, ischemic cardiomyopathy with prior EF of 40-45% and most recent echo showing normal to normal LV systolic function in [id number], PAF on Eliquis, HTN, and HLD, who was recently seen in [facility] ED in September for chest pain felt to be MSK in etiology presented to [facility] on 11/14 with return of chest  pain.  1.  Unstable angina/CAD:  Trops neg.  No further c/p.  Lexiscan MV this AM.  Further recs pending results.  Cont ASA/statin/bb.  2.  ICM:  EF 45-50% by echo 11/14, with basal-mid inf HK, though study was technically difficult.  Euvolemic on exam.  Cont bb.  3.  Essential HTN:  Stable.  4.  HL:  Cont statin.  LDL 57 last year.  Nl LFT's earlier this year.  5.  PAF:  In sinus.  Eliquis on hold in setting of potential need for invasive eval.  CHA2DS2VASc = 4.   Signed, Murray Hodgkins NP   Attending Note Patient seen and examined, agree with detailed note above,  Patient presentation and plan discussed on rounds.   Long discussion with patient today concerning his results.  Stress test with no significant ischemia Echocardiogram with mild depressed ejection fraction, small region of hypokinesis He is concerned that he required nitroglycerin for relief of symptoms Has not been taking nitroglycerin until now Cardiac enzymes negative indicating no infarct or active ischemia Everything was discussed with him and his family.  He is concerned that something might happen in the future. We did discuss that stress test is not [id number] and that certainly if he continues to have angina symptoms were relieved with nitroglycerin, he may require a catheterization in the future  Of note he does report being able to walk for 3 miles at a time with no angina Most of his symptoms seems to come on at rest Unclear of anxiety may be playing a role in his symptoms Reassurance provided We did discuss possibly starting isosorbide and even ranexa if symptoms persist    Signed: Esmond Plants  M.D., Ph.D. [facility]

## 2014-12-08 NOTE — Progress Notes (Signed)
Pt discharged to home via wc.  Instructions  given to pt.  Questions answered.  No distress.  

## 2014-12-08 NOTE — Plan of Care (Signed)
Problem: Phase I Progression Outcomes Goal: Anginal pain relieved Outcome: Progressing No chest pain today  Problem: Phase II Progression Outcomes Goal: Stress Test if indicated Outcome: Progressing Completed today

## 2014-12-08 NOTE — Discharge Instructions (Signed)

## 2014-12-10 NOTE — Discharge Summary (Signed)
[facility] [place] NAME: Zayvon Yelle    MR#:  [id number]  DATE OF BIRTH:  Sep 06, 1940  DATE OF ADMISSION:  12/07/2014 ADMITTING PHYSICIAN: Fritzi Mandes, MD  DATE OF DISCHARGE: 12/08/2014  3:52 PM  PRIMARY CARE PHYSICIAN: Madelyn Brunner, MD    ADMISSION DIAGNOSIS:  Chest pain, unspecified [R07.9]  DISCHARGE DIAGNOSIS:  Principal Problem:   Unstable angina (HCC) Active Problems:   CAD (coronary artery disease)   Hyperlipidemia   Cardiomyopathy, ischemic   HTN (hypertension)   PAF (paroxysmal atrial fibrillation) (HCC)   Chronic anticoagulation  SECONDARY DIAGNOSIS:   Past Medical History  Diagnosis Date  . Cardiomyopathy, ischemic     a. 04/2012 Echo: EF 40-45%;  b. 08/2013 Echo: EF 45-50%, mild glob HK, mod lat/post HK, diast dysfxn, mildly dil LA, mild MR, nl RVSP.  Marland Kitchen Hyperlipidemia   . Carotid arterial disease ([place])     a. 05/2013 Carotid U/S; bilat 40-50% ICA stenosis.  Marland Kitchen CAD (coronary artery disease)     a. 04/2012 Cath: 3VD->Med Rx;  b. 04/2013 Cath/PCI: LAD 50p, D1 40, LCX 80p, OM2 99diff, RCA 70p, 105m, 95d. Mid & distal RCA treated with 2 DES.   HOSPITAL COURSE:  74 y.o. male with a known history of coronary artery disease status post CABG 2 in June 2016, cardiac stents 4 in the past, cardiomyopathy with EF of 40-45% with recent echo done at [facility] showing normal LV function, hypertension, paroxysmal atrial fibrillation on by mouth anticoagulation admitted with chest pain. Please see Dr Gus Height Patel's dictated H & P for further details.   He was ruled out serial troponins but due to his ongoing symtops and concern, Cardio c/s was obtained who recommended Myoview which was obtained and was also negative.  Dr Rockey Situ had extensive d/w patient and family and they were pleased with overall care and explanations. Patient was D/C home in stable condition, he was agreeable with d/c plans. DISCHARGE CONDITIONS:    STABLE  CONSULTS OBTAINED:  Treatment Team:  Wellington Hampshire, MD Max Sane, MD  DRUG ALLERGIES:   Allergies  Allergen Reactions  . Contrast Media  [Iodinated Diagnostic Agents] Hives  . Oxycodone Hcl Itching    DISCHARGE MEDICATIONS:   Discharge Medication List as of 12/08/2014  3:20 PM    CONTINUE these medications which have NOT CHANGED   Details  apixaban (ELIQUIS) 5 MG TABS tablet Take 5 mg by mouth 2 (two) times daily., Until Discontinued, Historical Med    aspirin EC 81 MG tablet Take 1 tablet (81 mg total) by mouth daily., Starting 05/17/2013, Until Discontinued, OTC    atorvastatin (LIPITOR) 80 MG tablet Take 80 mg by mouth at bedtime., Until Discontinued, Historical Med    gabapentin (NEURONTIN) 300 MG capsule Take 600 mg by mouth at bedtime. , Until Discontinued, Historical Med    metoprolol tartrate (LOPRESSOR) 25 MG tablet Take 25 mg by mouth 2 (two) times daily., Until Discontinued, Historical Med    nitroGLYCERIN (NITROSTAT) 0.4 MG SL tablet Place 1 tablet (0.4 mg total) under the tongue every 5 (five) minutes as needed for chest pain., Starting 09/05/2013, Until Discontinued, Normal    zolpidem (AMBIEN) 5 MG tablet Take 5 mg by mouth at bedtime as needed for sleep. , Until Discontinued, Historical Med    prasugrel (EFFIENT) 10 MG TABS tablet Take 1 tablet (10 mg total) by mouth daily., Starting 06/09/2014, Until Discontinued, Normal  DISCHARGE INSTRUCTIONS:   DIET:  Regular diet DISCHARGE CONDITION:  Good ACTIVITY:  Activity as tolerated OXYGEN:  Home Oxygen: No.  Oxygen Delivery: room air DISCHARGE LOCATION:  home   If you experience worsening of your admission symptoms, develop shortness of breath, life threatening emergency, suicidal or homicidal thoughts you must seek medical attention immediately by calling 911 or calling your MD immediately  if symptoms less severe.  You Must read complete instructions/literature along with all the  possible adverse reactions/side effects for all the Medicines you take and that have been prescribed to you. Take any new Medicines after you have completely understood and accpet all the possible adverse reactions/side effects.   Please note  You were cared for by a hospitalist during your hospital stay. If you have any questions about your discharge medications or the care you received while you were in the hospital after you are discharged, you can call the unit and asked to speak with the hospitalist on call if the hospitalist that took care of you is not available. Once you are discharged, your primary care physician will handle any further medical issues. Please note that NO REFILLS for any discharge medications will be authorized once you are discharged, as it is imperative that you return to your primary care physician (or establish a relationship with a primary care physician if you do not have one) for your aftercare needs so that they can reassess your need for medications and monitor your lab values.    On the day of Discharge:   VITAL SIGNS:  Blood pressure 141/81, pulse 86, temperature 97.8 F (36.6 C), temperature source Oral, resp. rate 16, height 5\' 10"  (1.778 m), weight 78.109 kg (172 lb 3.2 oz), SpO2 96 %. PHYSICAL EXAMINATION:  GENERAL:  74 y.o.-year-old patient lying in the bed with no acute distress.  EYES: Pupils equal, round, reactive to light and accommodation. No scleral icterus. Extraocular muscles intact.  HEENT: Head atraumatic, normocephalic. Oropharynx and nasopharynx clear.  NECK:  Supple, no jugular venous distention. No thyroid enlargement, no tenderness.  LUNGS: Normal breath sounds bilaterally, no wheezing, rales,rhonchi or crepitation. No use of accessory muscles of respiration.  CARDIOVASCULAR: S1, S2 normal. No murmurs, rubs, or gallops.  ABDOMEN: Soft, non-tender, non-distended. Bowel sounds present. No organomegaly or mass.  EXTREMITIES: No pedal edema,  cyanosis, or clubbing.  NEUROLOGIC: Cranial nerves II through XII are intact. Muscle strength 5/5 in all extremities. Sensation intact. Gait not checked.  PSYCHIATRIC: The patient is alert and oriented x 3.  SKIN: No obvious rash, lesion, or ulcer.  DATA REVIEW:   CBC  Recent Labs Lab 12/07/14 0923  WBC 4.8  HGB 15.0  HCT 46.2  PLT 177    Chemistries   Recent Labs Lab 12/07/14 0923  NA 142  K 4.2  CL 111  CO2 28  GLUCOSE 105*  BUN 17  CREATININE 1.26*  CALCIUM 8.8*    Cardiac Enzymes  Recent Labs Lab 12/07/14 2031  TROPONINI <0.03    Microbiology Results  No results found for this or any previous visit.  RADIOLOGY:  No results found.   Management plans discussed with the patient, family and they are in agreement.  CODE STATUS:  Advance Directive Documentation        Most Recent Value   Type of Advance Directive  Healthcare Power of Attorney, Living will   Pre-existing out of facility DNR order (yellow form or pink MOST form)     "MOST" Form in  Place?        TOTAL TIME TAKING CARE OF THIS PATIENT: 55 minutes.    [facility], Jaidence Geisler M.D on 12/10/2014 at 10:25 PM  Between 7am to 6pm - Pager - [phone]  After 6pm go to [email or web address] - password [email or web address]  Office  [phone]  CC: Primary care physician; Madelyn Brunner, MD Esmond Plants M.D., Ph.D.

## 2014-12-16 ENCOUNTER — Encounter: Payer: Medicare Other | Admitting: Cardiovascular Disease

## 2014-12-21 ENCOUNTER — Ambulatory Visit: Payer: Medicare Other | Attending: Family | Admitting: Family

## 2014-12-21 ENCOUNTER — Encounter: Payer: Self-pay | Admitting: Family

## 2014-12-21 VITALS — BP 116/76 | HR 81 | Resp 20 | Ht 70.0 in | Wt 173.0 lb

## 2014-12-21 DIAGNOSIS — I25810 Atherosclerosis of coronary artery bypass graft(s) without angina pectoris: Secondary | ICD-10-CM | POA: Insufficient documentation

## 2014-12-21 DIAGNOSIS — I5032 Chronic diastolic (congestive) heart failure: Secondary | ICD-10-CM | POA: Insufficient documentation

## 2014-12-21 DIAGNOSIS — E785 Hyperlipidemia, unspecified: Secondary | ICD-10-CM | POA: Diagnosis not present

## 2014-12-21 DIAGNOSIS — I7789 Other specified disorders of arteries and arterioles: Secondary | ICD-10-CM | POA: Insufficient documentation

## 2014-12-21 DIAGNOSIS — I10 Essential (primary) hypertension: Secondary | ICD-10-CM | POA: Diagnosis not present

## 2014-12-21 DIAGNOSIS — Z7982 Long term (current) use of aspirin: Secondary | ICD-10-CM | POA: Diagnosis not present

## 2014-12-21 DIAGNOSIS — Z951 Presence of aortocoronary bypass graft: Secondary | ICD-10-CM | POA: Diagnosis not present

## 2014-12-21 DIAGNOSIS — G8929 Other chronic pain: Secondary | ICD-10-CM | POA: Diagnosis not present

## 2014-12-21 DIAGNOSIS — Z79899 Other long term (current) drug therapy: Secondary | ICD-10-CM | POA: Insufficient documentation

## 2014-12-21 DIAGNOSIS — I509 Heart failure, unspecified: Secondary | ICD-10-CM | POA: Insufficient documentation

## 2014-12-21 DIAGNOSIS — I209 Angina pectoris, unspecified: Secondary | ICD-10-CM | POA: Insufficient documentation

## 2014-12-21 DIAGNOSIS — I255 Ischemic cardiomyopathy: Secondary | ICD-10-CM | POA: Diagnosis not present

## 2014-12-21 DIAGNOSIS — Z87891 Personal history of nicotine dependence: Secondary | ICD-10-CM | POA: Diagnosis not present

## 2014-12-21 DIAGNOSIS — I4891 Unspecified atrial fibrillation: Secondary | ICD-10-CM | POA: Diagnosis not present

## 2014-12-21 DIAGNOSIS — I480 Paroxysmal atrial fibrillation: Secondary | ICD-10-CM

## 2014-12-21 NOTE — Progress Notes (Signed)
Subjective:    Patient ID: Scott [name], male    DOB: [birth year], 74 y.o.   MRN: [id number]  Congestive Heart Failure Presents for initial visit. The disease course has been stable. Associated symptoms include chest pain, edema (little bit around the ankles) and fatigue. Pertinent negatives include no abdominal pain, orthopnea, palpitations, paroxysmal nocturnal dyspnea or shortness of breath. The symptoms have been improving. Past treatments include salt and fluid restriction, exercise and beta blockers. The treatment provided moderate relief. Compliance with prior treatments has been good. His past medical history is significant for arrhythmia, CAD and HTN. There is no history of DVT. He has one 1st degree relative with heart disease. Compliance with total regimen is 76-100%.  Chest Pain  This is a chronic problem. The current episode started more than 1 month ago. The onset quality is gradual. The problem occurs 2 to 4 times per day. The problem has been unchanged. The pain is present in the substernal region. The pain is at a severity of 5/10. The pain is mild. The quality of the pain is described as burning. The pain does not radiate. Associated symptoms include dizziness, headaches (since CABG) and malaise/fatigue. Pertinent negatives include no abdominal pain, back pain, cough, irregular heartbeat, nausea, numbness, palpitations or shortness of breath. The pain is aggravated by exertion. He has tried nitroglycerin and rest for the symptoms. The treatment provided mild relief. Risk factors include being elderly and male gender.  His past medical history is significant for arrhythmia, CAD, CHF and HTN.  Pertinent negatives for past medical history include no DVT.  His family medical history is significant for CAD. Prior diagnostic workup includes echocardiogram, chest x-ray and exercise treadmill test.    Past Medical History  Diagnosis Date  . Cardiomyopathy, ischemic     a. 04/2012 Echo: EF  40-45%;  b. 08/2013 Echo: EF 45-50%, mild glob HK, mod lat/post HK, diast dysfxn, mildly dil LA, mild MR, nl RVSP.  Marland Kitchen Hyperlipidemia   . Carotid arterial disease ([place])     a. 05/2013 Carotid U/S; bilat 40-50% ICA stenosis.  Marland Kitchen CAD (coronary artery disease)     a. 04/2012 Cath: 3VD->Med Rx;  b. 04/2013 Cath/PCI: LAD 50p, D1 40, LCX 80p, OM2 99diff, RCA 70p, 63m, 95d. Mid & distal RCA treated with 2 DES.    Past Surgical History  Procedure Laterality Date  . Right shoulder    . Carpal tunnel release      right hand  . Tonsillectomy    . Cardiac catheterization  05/01/2012  . Cardiac catheterization  04/2013    [facility];x3 stent  . Coronary angioplasty with stent placement  04/13/2014  . Coronary artery bypass graft  06-26-14  . Cataract extraction w/phaco Left 09/16/2014    Procedure: CATARACT EXTRACTION PHACO AND INTRAOCULAR LENS PLACEMENT (IOC);  Surgeon: Leandrew Koyanagi, MD;  Location: [facility];  Service: Ophthalmology;  Laterality: Left;    Family History  Problem Relation Age of Onset  . Heart attack Father 90    MI  . Cancer Maternal Uncle     Social History  Substance Use Topics  . Smoking status: Former Smoker -- 1.00 packs/day for 35 years    Types: Cigarettes    Start date: 05/02/2009  . Smokeless tobacco: Never Used  . Alcohol Use: No    Allergies  Allergen Reactions  . Contrast Media  [Iodinated Diagnostic Agents] Hives  . Oxycodone Hcl Itching    Prior to Admission medications  Medication Sig Start Date End Date Taking? Authorizing Provider  apixaban (ELIQUIS) 5 MG TABS tablet Take 5 mg by mouth 2 (two) times daily.   Yes Historical Provider, MD  aspirin EC 81 MG tablet Take 1 tablet (81 mg total) by mouth daily. Patient taking differently: Take 81 mg by mouth at bedtime.  05/17/13  Yes Wellington Hampshire, MD  atorvastatin (LIPITOR) 80 MG tablet Take 80 mg by mouth at bedtime.   Yes Historical Provider, MD  gabapentin (NEURONTIN) 300 MG capsule Take 600 mg  by mouth at bedtime.    Yes Historical Provider, MD  metoprolol tartrate (LOPRESSOR) 25 MG tablet Take 25 mg by mouth 2 (two) times daily.   Yes Historical Provider, MD  nitroGLYCERIN (NITROSTAT) 0.4 MG SL tablet Place 1 tablet (0.4 mg total) under the tongue every 5 (five) minutes as needed for chest pain. 09/05/13  Yes Minna Merritts, MD  zolpidem (AMBIEN) 5 MG tablet Take 5 mg by mouth at bedtime as needed for sleep.    Yes Historical Provider, MD     Review of Systems  Constitutional: Positive for malaise/fatigue and fatigue. Negative for appetite change.  HENT: Positive for rhinorrhea. Negative for congestion, postnasal drip and sore throat.   Eyes: Negative.   Respiratory: Negative for cough, shortness of breath and wheezing.   Cardiovascular: Positive for chest pain and leg swelling (around ankles). Negative for palpitations.  Gastrointestinal: Negative for nausea, abdominal pain and abdominal distention.  Endocrine: Negative.   Genitourinary: Negative.   Musculoskeletal: Negative for back pain and neck pain.  Skin: Negative.   Allergic/Immunologic: Negative.   Neurological: Positive for dizziness, light-headedness and headaches (since CABG). Negative for numbness.  Hematological: Negative for adenopathy. Bruises/bleeds easily.  Psychiatric/Behavioral: Negative for sleep disturbance (sleeping on 1 pillow. nightmares without ambien) and dysphoric mood. The patient is not nervous/anxious.        Objective:   Physical Exam  Constitutional: He is oriented to person, place, and time. He appears well-developed and well-nourished.  HENT:  Head: Normocephalic and atraumatic.  Eyes: Conjunctivae are normal. Pupils are equal, round, and reactive to light.  Neck: Normal range of motion. Neck supple.  Cardiovascular: Normal rate and regular rhythm.   Pulmonary/Chest: Effort normal. He has no wheezes. He has no rales.  Abdominal: Soft. He exhibits no distension. There is no tenderness.   Musculoskeletal: He exhibits edema (trace amount of swelling around ankles with L>R). He exhibits no tenderness.  Neurological: He is alert and oriented to person, place, and time.  Skin: Skin is warm and dry.  Psychiatric: He has a normal mood and affect. His behavior is normal. Thought content normal.  Nursing note and vitals reviewed.   BP 116/76 mmHg  Pulse 81  Resp 20  Ht 5\' 10"  (1.778 m)  Wt 173 lb (78.472 kg)  BMI 24.82 kg/m2  SpO2 99%       Assessment & Plan:  1: Chronic heart failure with preserved ejection fraction- Patient presents with some fatigue upon exertion but he feels like it's improving. Does walk 1 mile three days a week without difficulty. Denies any shortness of breath. Does have some chronic edema around his ankles. He does have scales at home but doesn't weigh himself on a daily basis. Discussed the importance of weighing himself daily and to call for an overnight weight gain of >2 pounds or a weekly weight gain of >5 pounds. He does add "some salt" to some foods and a 2000mg  sodium  diet was discussed with him and his wife. Written information was also provided to him regarding this.  2: HTN- Blood pressure looks good today. Continue medications at this time. Sees his PCP on 12/23/14. 3: Atrial fibrillation- Currently rate controlled. Taking eliquis and lopressor. 4: Cardiac angina- Patient continues to have chronic pain over his sternum that has been present since his CABG in June 2016. Been worked up extensively and he says that he's been told that it's nerve pain and that it will "take time". Since he has chronic pain, discussed the importance of letting his cardiologist know if his pattern changes, worsens or if he develops other symptoms along with it. Sees his [facility] cardiologist on 12/22/14.  Return here in 1 month or sooner for any questions/problems before then.

## 2014-12-21 NOTE — Patient Instructions (Addendum)
Begin weighing daily and call for an overnight weight gain of > 2 pounds or a weekly weight gain of >5 pounds. 

## 2015-01-11 HISTORY — PX: CARDIAC CATHETERIZATION: SHX172

## 2015-01-28 DIAGNOSIS — I480 Paroxysmal atrial fibrillation: Secondary | ICD-10-CM | POA: Diagnosis not present

## 2015-01-28 DIAGNOSIS — I5032 Chronic diastolic (congestive) heart failure: Secondary | ICD-10-CM | POA: Diagnosis not present

## 2015-01-28 DIAGNOSIS — I2510 Atherosclerotic heart disease of native coronary artery without angina pectoris: Secondary | ICD-10-CM | POA: Diagnosis not present

## 2015-01-28 DIAGNOSIS — I10 Essential (primary) hypertension: Secondary | ICD-10-CM | POA: Diagnosis not present

## 2015-01-28 DIAGNOSIS — E782 Mixed hyperlipidemia: Secondary | ICD-10-CM | POA: Diagnosis not present

## 2015-01-29 ENCOUNTER — Ambulatory Visit: Payer: Medicare Other | Admitting: Family

## 2015-02-08 ENCOUNTER — Emergency Department: Payer: Medicare HMO

## 2015-02-08 ENCOUNTER — Encounter: Payer: Self-pay | Admitting: Emergency Medicine

## 2015-02-08 ENCOUNTER — Telehealth: Payer: Self-pay | Admitting: *Deleted

## 2015-02-08 ENCOUNTER — Observation Stay
Admission: EM | Admit: 2015-02-08 | Discharge: 2015-02-09 | Disposition: A | Discharge status: Home / Self Care | Payer: Medicare HMO | Attending: Internal Medicine | Admitting: Internal Medicine

## 2015-02-08 ENCOUNTER — Observation Stay (HOSPITAL_BASED_OUTPATIENT_CLINIC_OR_DEPARTMENT_OTHER)
Admit: 2015-02-08 | Discharge: 2015-02-08 | Disposition: A | Discharge status: Home / Self Care | Payer: Medicare HMO | Attending: Physician Assistant | Admitting: Physician Assistant

## 2015-02-08 DIAGNOSIS — F419 Anxiety disorder, unspecified: Secondary | ICD-10-CM | POA: Diagnosis not present

## 2015-02-08 DIAGNOSIS — R05 Cough: Secondary | ICD-10-CM | POA: Diagnosis not present

## 2015-02-08 DIAGNOSIS — I5032 Chronic diastolic (congestive) heart failure: Secondary | ICD-10-CM | POA: Diagnosis not present

## 2015-02-08 DIAGNOSIS — R001 Bradycardia, unspecified: Secondary | ICD-10-CM | POA: Insufficient documentation

## 2015-02-08 DIAGNOSIS — I480 Paroxysmal atrial fibrillation: Secondary | ICD-10-CM | POA: Diagnosis not present

## 2015-02-08 DIAGNOSIS — R0789 Other chest pain: Principal | ICD-10-CM | POA: Insufficient documentation

## 2015-02-08 DIAGNOSIS — Z7901 Long term (current) use of anticoagulants: Secondary | ICD-10-CM | POA: Insufficient documentation

## 2015-02-08 DIAGNOSIS — I252 Old myocardial infarction: Secondary | ICD-10-CM | POA: Diagnosis not present

## 2015-02-08 DIAGNOSIS — R079 Chest pain, unspecified: Secondary | ICD-10-CM | POA: Diagnosis present

## 2015-02-08 DIAGNOSIS — I255 Ischemic cardiomyopathy: Secondary | ICD-10-CM | POA: Diagnosis not present

## 2015-02-08 DIAGNOSIS — K219 Gastro-esophageal reflux disease without esophagitis: Secondary | ICD-10-CM | POA: Insufficient documentation

## 2015-02-08 DIAGNOSIS — I25110 Atherosclerotic heart disease of native coronary artery with unstable angina pectoris: Secondary | ICD-10-CM | POA: Diagnosis not present

## 2015-02-08 DIAGNOSIS — G4700 Insomnia, unspecified: Secondary | ICD-10-CM | POA: Insufficient documentation

## 2015-02-08 DIAGNOSIS — Z7982 Long term (current) use of aspirin: Secondary | ICD-10-CM | POA: Insufficient documentation

## 2015-02-08 DIAGNOSIS — I200 Unstable angina: Secondary | ICD-10-CM | POA: Diagnosis not present

## 2015-02-08 DIAGNOSIS — Z955 Presence of coronary angioplasty implant and graft: Secondary | ICD-10-CM | POA: Insufficient documentation

## 2015-02-08 DIAGNOSIS — R0602 Shortness of breath: Secondary | ICD-10-CM | POA: Insufficient documentation

## 2015-02-08 DIAGNOSIS — R6881 Early satiety: Secondary | ICD-10-CM | POA: Diagnosis not present

## 2015-02-08 DIAGNOSIS — E785 Hyperlipidemia, unspecified: Secondary | ICD-10-CM | POA: Diagnosis not present

## 2015-02-08 DIAGNOSIS — R42 Dizziness and giddiness: Secondary | ICD-10-CM | POA: Diagnosis not present

## 2015-02-08 DIAGNOSIS — I110 Hypertensive heart disease with heart failure: Secondary | ICD-10-CM | POA: Diagnosis not present

## 2015-02-08 DIAGNOSIS — I25701 Atherosclerosis of coronary artery bypass graft(s), unspecified, with angina pectoris with documented spasm: Secondary | ICD-10-CM | POA: Insufficient documentation

## 2015-02-08 DIAGNOSIS — Z87891 Personal history of nicotine dependence: Secondary | ICD-10-CM | POA: Insufficient documentation

## 2015-02-08 DIAGNOSIS — Z885 Allergy status to narcotic agent status: Secondary | ICD-10-CM | POA: Diagnosis not present

## 2015-02-08 DIAGNOSIS — N179 Acute kidney failure, unspecified: Secondary | ICD-10-CM | POA: Insufficient documentation

## 2015-02-08 DIAGNOSIS — R072 Precordial pain: Secondary | ICD-10-CM | POA: Diagnosis not present

## 2015-02-08 DIAGNOSIS — I209 Angina pectoris, unspecified: Secondary | ICD-10-CM | POA: Diagnosis not present

## 2015-02-08 DIAGNOSIS — Z951 Presence of aortocoronary bypass graft: Secondary | ICD-10-CM | POA: Insufficient documentation

## 2015-02-08 DIAGNOSIS — Z91041 Radiographic dye allergy status: Secondary | ICD-10-CM | POA: Insufficient documentation

## 2015-02-08 LAB — CBC
HCT: 49.5 % (ref 40.0–52.0)
Hemoglobin: 16.2 g/dL (ref 13.0–18.0)
MCH: 29.5 pg (ref 26.0–34.0)
MCHC: 32.7 g/dL (ref 32.0–36.0)
MCV: 90.3 fL (ref 80.0–100.0)
PLATELETS: 221 10*3/uL (ref 150–440)
RBC: 5.48 MIL/uL (ref 4.40–5.90)
RDW: 15.5 % — ABNORMAL HIGH (ref 11.5–14.5)
WBC: 4.3 10*3/uL (ref 3.8–10.6)

## 2015-02-08 LAB — BASIC METABOLIC PANEL
Anion gap: 8 (ref 5–15)
BUN: 17 mg/dL (ref 6–20)
CALCIUM: 9.6 mg/dL (ref 8.9–10.3)
CO2: 27 mmol/L (ref 22–32)
CREATININE: 1.28 mg/dL — AB (ref 0.61–1.24)
Chloride: 106 mmol/L (ref 101–111)
GFR, EST NON AFRICAN AMERICAN: 54 mL/min — AB (ref >60)
Glucose, Bld: 109 mg/dL — ABNORMAL HIGH (ref 65–99)
Potassium: 4.5 mmol/L (ref 3.5–5.1)
SODIUM: 141 mmol/L (ref 135–145)

## 2015-02-08 LAB — TROPONIN I
TROPONIN I: 0.03 ng/mL (ref <0.031)
Troponin I: <0.03 ng/mL (ref <0.031)

## 2015-02-08 LAB — PROTIME-INR
INR: 1.06
Prothrombin Time: 14 seconds (ref 11.4–15.0)

## 2015-02-08 LAB — APTT: APTT: 28 s (ref 24–36)

## 2015-02-08 MED ORDER — ASPIRIN 81 MG PO CHEW
324.0000 mg | CHEWABLE_TABLET | Freq: Once | ORAL | Status: AC
  Administered 2015-02-08: 324 mg via ORAL

## 2015-02-08 MED ORDER — ACETAMINOPHEN 650 MG RE SUPP: 650.0000 mg | Freq: Four times a day (QID) | RECTAL | Status: DC | PRN

## 2015-02-08 MED ORDER — ASPIRIN EC 81 MG PO TBEC
81.0000 mg | DELAYED_RELEASE_TABLET | Freq: Every day | ORAL | Status: DC
  Administered 2015-02-08 – 2015-02-09 (×2): 81 mg via ORAL
  Filled 2015-02-08 (×2): qty 1

## 2015-02-08 MED ORDER — MORPHINE SULFATE (PF) 4 MG/ML IV SOLN
INTRAVENOUS | Status: AC
  Administered 2015-02-08: 4 mg via INTRAVENOUS
  Filled 2015-02-08: qty 1

## 2015-02-08 MED ORDER — ACETAMINOPHEN 325 MG PO TABS
650.0000 mg | ORAL_TABLET | Freq: Four times a day (QID) | ORAL | Status: DC | PRN
  Administered 2015-02-09: 650 mg via ORAL
  Filled 2015-02-08: qty 2

## 2015-02-08 MED ORDER — TRAZODONE HCL 50 MG PO TABS: 25.0000 mg | ORAL_TABLET | Freq: Every evening | ORAL | Status: DC | PRN

## 2015-02-08 MED ORDER — ONDANSETRON HCL 4 MG/2ML IJ SOLN
INTRAMUSCULAR | Status: AC
  Administered 2015-02-08: 4 mg via INTRAVENOUS
  Filled 2015-02-08: qty 2

## 2015-02-08 MED ORDER — MORPHINE SULFATE (PF) 4 MG/ML IV SOLN
4.0000 mg | Freq: Once | INTRAVENOUS | Status: AC
  Administered 2015-02-08: 4 mg via INTRAVENOUS

## 2015-02-08 MED ORDER — ALUM & MAG HYDROXIDE-SIMETH 200-200-20 MG/5ML PO SUSP: 30.0000 mL | Freq: Four times a day (QID) | ORAL | Status: DC | PRN

## 2015-02-08 MED ORDER — PANTOPRAZOLE SODIUM 40 MG PO TBEC
40.0000 mg | DELAYED_RELEASE_TABLET | Freq: Every day | ORAL | Status: DC
  Administered 2015-02-09: 40 mg via ORAL
  Filled 2015-02-08: qty 1

## 2015-02-08 MED ORDER — MORPHINE SULFATE (PF) 2 MG/ML IV SOLN: 2.0000 mg | INTRAVENOUS | Status: DC | PRN

## 2015-02-08 MED ORDER — HEPARIN SODIUM (PORCINE) 5000 UNIT/ML IJ SOLN
INTRAMUSCULAR | Status: AC
  Filled 2015-02-08: qty 1

## 2015-02-08 MED ORDER — FAMOTIDINE 20 MG PO TABS
10.0000 mg | ORAL_TABLET | Freq: Every day | ORAL | Status: DC
Start: 2015-02-08 — End: 2015-02-09
  Administered 2015-02-09: 10 mg via ORAL
  Filled 2015-02-08: qty 1

## 2015-02-08 MED ORDER — NITROGLYCERIN 2 % TD OINT: 0.5000 [in_us] | TOPICAL_OINTMENT | Freq: Four times a day (QID) | TRANSDERMAL | Status: DC

## 2015-02-08 MED ORDER — HEPARIN BOLUS VIA INFUSION
4000.0000 [IU] | Freq: Once | INTRAVENOUS | Status: AC
  Administered 2015-02-08: 4000 [IU] via INTRAVENOUS
  Filled 2015-02-08: qty 4000

## 2015-02-08 MED ORDER — ONDANSETRON HCL 4 MG/2ML IJ SOLN
4.0000 mg | Freq: Once | INTRAMUSCULAR | Status: AC
  Administered 2015-02-08: 4 mg via INTRAVENOUS

## 2015-02-08 MED ORDER — ATORVASTATIN CALCIUM 20 MG PO TABS
80.0000 mg | ORAL_TABLET | Freq: Every day | ORAL | Status: DC
  Administered 2015-02-08 – 2015-02-09 (×2): 80 mg via ORAL
  Filled 2015-02-08 (×2): qty 4

## 2015-02-08 MED ORDER — NITROGLYCERIN 2 % TD OINT
0.5000 [in_us] | TOPICAL_OINTMENT | Freq: Two times a day (BID) | TRANSDERMAL | Status: DC
  Administered 2015-02-08: 0.5 [in_us] via TOPICAL
  Filled 2015-02-08: qty 1

## 2015-02-08 MED ORDER — ZOLPIDEM TARTRATE 5 MG PO TABS: 5.0000 mg | ORAL_TABLET | Freq: Every evening | ORAL | Status: DC | PRN

## 2015-02-08 MED ORDER — ASPIRIN 81 MG PO CHEW
CHEWABLE_TABLET | ORAL | Status: AC
  Administered 2015-02-08: 324 mg via ORAL
  Filled 2015-02-08: qty 4

## 2015-02-08 MED ORDER — DOCUSATE SODIUM 100 MG PO CAPS
100.0000 mg | ORAL_CAPSULE | Freq: Two times a day (BID) | ORAL | Status: DC
  Administered 2015-02-09: 100 mg via ORAL
  Filled 2015-02-08: qty 1

## 2015-02-08 MED ORDER — HEPARIN (PORCINE) IN NACL 100-0.45 UNIT/ML-% IJ SOLN
900.0000 [IU]/h | INTRAMUSCULAR | Status: DC
  Administered 2015-02-08: 900 [IU]/h via INTRAVENOUS
  Filled 2015-02-08: qty 250

## 2015-02-08 MED ORDER — APIXABAN 5 MG PO TABS
5.0000 mg | ORAL_TABLET | Freq: Two times a day (BID) | ORAL | Status: DC
  Administered 2015-02-08 – 2015-02-09 (×2): 5 mg via ORAL
  Filled 2015-02-08 (×2): qty 1

## 2015-02-08 MED ORDER — NITROGLYCERIN 0.4 MG SL SUBL: 0.4000 mg | SUBLINGUAL_TABLET | SUBLINGUAL | Status: DC | PRN

## 2015-02-08 MED ORDER — NITROGLYCERIN IN D5W 200-5 MCG/ML-% IV SOLN
0.0000 ug/min | INTRAVENOUS | Status: DC
  Administered 2015-02-08: 5 ug/min via INTRAVENOUS
  Filled 2015-02-08: qty 250

## 2015-02-08 MED ORDER — METOPROLOL TARTRATE 25 MG PO TABS
25.0000 mg | ORAL_TABLET | Freq: Two times a day (BID) | ORAL | Status: DC
  Administered 2015-02-08 – 2015-02-09 (×2): 25 mg via ORAL
  Filled 2015-02-08 (×2): qty 1

## 2015-02-08 NOTE — Telephone Encounter (Signed)
Pt c/o of Chest Pain: STAT if CP now or developed within 24 hours  1. Are you having CP right now? Yes   2. Are you experiencing any other symptoms (ex. SOB, nausea, vomiting, sweating)? Light headedness   3. How long have you been experiencing CP? For few days but this morning it having tightness of chest   4. Is your CP continuous or coming and going?   5. Have you taken Nitroglycerin? Have taken it 2 x already 6 am and then at 730 am no relief and took some last night but still doesn't feel like its working.  Woke up this morning with CP and Shallow breathing. Had EMS come out and do an EKG and they did not find anything. Has had these symptoms for a few days but this morning it was bad.  EMS already left, they came to check on him said he was okay. They talked about it may being acid reflux but not sure what that was all about he states.

## 2015-02-08 NOTE — ED Notes (Addendum)
Pt presents with chest pain started Thursday of last week. Pt with hx of MI. Pt very uncomfortable in triage with some diaphoresis noted. Pt placed on 2liters of oxygen for comfort.

## 2015-02-08 NOTE — Progress Notes (Signed)
Patient alert and oriented x4. Oriented to room, unit, and call bell. Admission completed. No complaints at this time. Will cont to assess. Skin assessment verified by Weldon Picking., RN. Telemetry box verified. Scott [name]

## 2015-02-08 NOTE — ED Notes (Signed)
Pt resting in bed, states chest pain 3/10, family at bedside

## 2015-02-08 NOTE — Consult Note (Signed)
Cardiology Consultation Note  Patient ID: Scott [name], MRN: [id number], DOB/AGE: 09/22/1940 75 y.o. Admit date: 02/08/2015   Date of Consult: 02/08/2015 Primary Physician: Madelyn Brunner, MD Primary Cardiologist: [facility] vs Dr. Rockey Situ, MD ? (patient has an appt with Dr. Rockey Situ on 1/26 at 11 AM, though has been seeing [facility])  Chief Complaint: Chest pain Reason for Consult: Chest pain  HPI: 75 y.o. male with h/o CAD s/p CABG x 2 at [facility] in June 2016 with LIMA-LAD and SVG-OM s/p prior multiple stents most recently 03/2014 with PCI/DES to prox LAD, ischemic cardiomyopathy with prior EF of 40-45% and most recent echo showing EF 45-50% in 11/2014, PAF on Eliquis, HTN, and HLD, who was recently seen in the [facility] ED in September for chest pain felt to be MSK in etiology presented to [facility] in November with return of chest pain, and underwent nuclear stress testing that was low risk. He continued to have chest pain and underwent cardiac cath at [facility] in December 2016 that showed no obstructive disease. [facility] does not feel his symptoms are heart releated and GI evaluation should be conisdered. He again returned to [facility] on 1/16 with his same chest pain. Cardiology is consulted for further evaluation.   Patient underwent cardiac cath back in April 2014 in the setting of SOB that showed severe RCA disease with left to right collaterals, subtotally occluded prox LCx, 50% prox LAD, 40% mid LAD, EF 35% by LV gram. At that time echo showed EF of 40-45%. Medical treatment was recommended. He underwent repeat cardiac cath in April 2015 that showed severe mid to distal RCA disease, severe subtotally occluded proximal LCx disease, left to right collaterals from the mid to distal LCx, mild to moderate prox LAD disease, EF 50%. Case was discussed with interventional cardiology and it was felt that he would be best served by intervention on his RCA rather than the LCx which was a smaller vessel. There were no significant targets for  CABG and LAD grafting was not needed. He subsequently underwent a third cardiac cath in March 2016 in the setting of ongoing chest pain that showed LM 40% not significant by FFR (0.89), prox LAD 80% just after D1 s/p PCI/DES, distal LAD 40%, prox LCx 100% just after OM1 which was chronic total occlusion, OM2 diffuse 100% prox RCA 30% mid RCA there was stenosis at site of prior stent, distal RCA there was stenosis at site of prior stent. EF 50%.   He presented to [facility] 05/2014 with unstable angina. Underwent successful 2 vessel CABG with LIMA-LAD and SVG-OM. Post-op course was complicated by Afib/flutter, otherwise he did well and was pain free during his cardiac rehab. He has been on Eliquis 5 mg bid and Lopressor 25 mg for his Afib. He did present to the [facility] ED in 09/2014 with chest pain that was felt to be 2/2 MSK and was started on Tylenol and advised to follow up with Cardiology at [facility] as an outpatient. He followed up on 10/13/2014 and was doing well. Echo at that visit showed normal LV size and function with minimal MR. He was continued on Eliquis and and beta blocker.   He presented to [facility] on 11/14 after waking up with chest pain. Cardiac enzyems were negative. CXR showed mild cardiomegaly with no pulmonary edema. He underwent nuclear stress testing that was low risk and showed no significant ischemia. EF 24%. Echo on 12/07/2014 showed EF of 45-50%, possible HF of the basal to mid  inferior myocardium, normal LV diastolic parameters, LA mildly dilated, PASP normal.   He called [facility] in December with his chronic chest pain and underwent repeat cardiac cath on 12/19 that showed no obstructive CAD. He was continued on medical therapy. His symptoms were not felt to be heart related.   He continues to have chest discomfort that is not related to rest or exertion. Not related to timing of meals. He has never had a GI work up. He generally will drink several caffeinated beverages and no water through the course  of the day.  He developed nonexertional chest pain on 1/12 and again on 1/15 prompting him to come to the hospital. Pain does not radiate. There is some nausea, no vomiting. No diaphoresis. Pain does not feel like any prior pain before, including his most recent admissions. He also notes a chronic, nonproductive cough. Weight has been stable, no LEE or abdominal fullness. He does note early satiety. No orthopnea or PND. Because of his chest pain he contacted the office who advised him to come to the hospital. Prior to contacting EMS he called a friend who is works for EMS who ran an ECG, reportedly this study was ok per the patient. His symptoms persisted prompting him to come to the hospital. Of notes, the patient does self report anxiety.   Upon the patient's arrival to [facility] they were found to have troponin negative x 2, SCr 1.28, K+ 4.5, unremarkable CBC outside of RDW of 15.5, . ECG initially showed st/t changes. ECG were checked every 20-30 minutes with nonspecific TWI in leads III an V6, CXR showed no acute cardiopulmonary disease. He currently noting 4-5/10 chest pain, which is improved from prior.    Past Medical History  Diagnosis Date  . Cardiomyopathy, ischemic     a. 04/2012 Echo: EF 40-45%;  b. 08/2013 Echo: EF 45-50%, mild glob HK, mod lat/post HK, diast dysfxn, mildly dil LA, mild MR, nl RVSP.  Marland Kitchen Hyperlipidemia   . Carotid arterial disease ([place])     a. 05/2013 Carotid U/S; bilat 40-50% ICA stenosis.  Marland Kitchen CAD (coronary artery disease)     a. 04/2012 Cath: 3VD->Med Rx;  b. 04/2013 Cath/PCI: LAD 50p, D1 40, LCX 80p, OM2 99diff, RCA 70p, 18m, 95d. Mid & distal RCA treated with 2 DES.  . MI, old       Most Recent Cardiac Studies: Echo 11/2014: Study Conclusions  - Procedure narrative: Transthoracic echocardiography. Image quality was poor. The study was technically difficult, as a result of poor acoustic windows and poor sound wave transmission. - Left ventricle: The cavity size was  mildly dilated. Systolic function was mildly reduced. The estimated ejection fraction was in the range of 45% to 50%. Several images suggesting hypokinesis of the basal to mid inferior myocardium. Left ventricular diastolic function parameters were normal. - Left atrium: The atrium was mildly dilated. - Right ventricle: Systolic function was normal. - Pulmonary arteries: Systolic pressure was within the normal range.  Impressions:  - Challenging image quality.  Cardiac cath 12/2014: With occluded proximal left circumflex, 50% stenosis proximal LAD, and sequential 95% and 80% stenoses distal RCA. No significant obstructive coronary disease.     Surgical History:  Past Surgical History  Procedure Laterality Date  . Right shoulder    . Carpal tunnel release      right hand  . Tonsillectomy    . Cardiac catheterization  05/01/2012  . Cardiac catheterization  04/2013    [facility];x3 stent  .  Coronary angioplasty with stent placement  04/13/2014  . Coronary artery bypass graft  06-26-14  . Cataract extraction w/phaco Left 09/16/2014    Procedure: CATARACT EXTRACTION PHACO AND INTRAOCULAR LENS PLACEMENT (IOC);  Surgeon: Leandrew Koyanagi, MD;  Location: [place];  Service: Ophthalmology;  Laterality: Left;     Home Meds: Prior to Admission medications   Medication Sig Start Date End Date Taking? Authorizing Provider  apixaban (ELIQUIS) 5 MG TABS tablet Take 5 mg by mouth 2 (two) times daily.   Yes Historical Provider, MD  atorvastatin (LIPITOR) 80 MG tablet Take 80 mg by mouth at bedtime.   Yes Historical Provider, MD  metoprolol tartrate (LOPRESSOR) 25 MG tablet Take 25 mg by mouth 2 (two) times daily.   Yes Historical Provider, MD  nitroGLYCERIN (NITROSTAT) 0.4 MG SL tablet Place 1 tablet (0.4 mg total) under the tongue every 5 (five) minutes as needed for chest pain. 09/05/13  Yes Minna Merritts, MD  omeprazole (PRILOSEC) 20 MG capsule Take 20 mg by mouth 2 (two)  times daily.   Yes Historical Provider, MD  ranitidine (ZANTAC) 300 MG tablet Take 300 mg by mouth at bedtime.   Yes Historical Provider, MD  zolpidem (AMBIEN) 5 MG tablet Take 5 mg by mouth at bedtime as needed for sleep.    Yes Historical Provider, MD  aspirin EC 81 MG tablet Take 1 tablet (81 mg total) by mouth daily. Patient taking differently: Take 81 mg by mouth at bedtime.  05/17/13   Wellington Hampshire, MD    Inpatient Medications:  . apixaban  5 mg Oral BID  . aspirin EC  81 mg Oral QHS  . atorvastatin  80 mg Oral QHS  . docusate sodium  100 mg Oral BID  . famotidine  10 mg Oral Daily  . heparin      . metoprolol tartrate  25 mg Oral BID  . nitroGLYCERIN  0.5 inch Topical 4 times per day  . pantoprazole  40 mg Oral Daily      Allergies:  Allergies  Allergen Reactions  . Contrast Media  [Iodinated Diagnostic Agents] Hives  . Oxycodone Hcl Itching    Social History   Social History  . Marital Status: Married    Spouse Name: N/A  . Number of Children: N/A  . Years of Education: N/A   Occupational History  . Not on file.   Social History Main Topics  . Smoking status: Former Smoker -- 1.00 packs/day for 35 years    Types: Cigarettes    Start date: 05/02/2009  . Smokeless tobacco: Never Used  . Alcohol Use: No  . Drug Use: No  . Sexual Activity: Yes   Other Topics Concern  . Not on file   Social History Narrative     Family History  Problem Relation Age of Onset  . Heart attack Father 72    MI  . Cancer Maternal Uncle      Review of Systems: Review of Systems  Constitutional: Positive for malaise/fatigue. Negative for fever, chills, weight loss and diaphoresis.  HENT: Negative for congestion.   Eyes: Negative for discharge and redness.  Respiratory: Positive for cough. Negative for hemoptysis, sputum production, shortness of breath and wheezing.   Cardiovascular: Positive for chest pain. Negative for palpitations, orthopnea, claudication, leg  swelling and PND.  Gastrointestinal: Positive for heartburn and nausea. Negative for vomiting and abdominal pain.  Musculoskeletal: Negative for falls.  Skin: Negative for rash.  Neurological: Positive for  dizziness, weakness and headaches. Negative for sensory change, speech change, focal weakness and loss of consciousness.       Dizzy with positional changes   Endo/Heme/Allergies: Does not bruise/bleed easily.  Psychiatric/Behavioral: Negative for substance abuse. The patient is nervous/anxious.      Labs:  Recent Labs  02/08/15 1024 02/08/15 1323  TROPONINI 0.03 <0.03   Lab Results  Component Value Date   WBC 4.3 02/08/2015   HGB 16.2 02/08/2015   HCT 49.5 02/08/2015   MCV 90.3 02/08/2015   PLT 221 02/08/2015    Recent Labs Lab 02/08/15 1024  NA 141  K 4.5  CL 106  CO2 27  BUN 17  CREATININE 1.28*  CALCIUM 9.6  GLUCOSE 109*   Lab Results  Component Value Date   CHOL 122 12/15/2013   HDL 51 12/15/2013   LDLCALC 56 12/15/2013   TRIG 76 12/15/2013   No results found for: DDIMER  Radiology/Studies:  Dg Chest Port 1 View  02/08/2015  CLINICAL DATA:  Mid sternal chest pain since 5 a.m. EXAM: PORTABLE CHEST 1 VIEW COMPARISON:  12/07/2014 FINDINGS: Prior CABG. Heart is normal size. Lungs are clear. No effusions or acute bony abnormality. IMPRESSION: No active disease. Electronically Signed   By: Rolm Baptise M.D.   On: 02/08/2015 11:47    EKG: NSR, 70 bpm, nonspecific st/t changes III, V6  Weights: Filed Weights   02/08/15 1142 02/08/15 1356  Weight: 170 lb (77.111 kg) 167 lb (75.751 kg)     Physical Exam: Blood pressure 137/72, pulse 65, temperature 98.1 F (36.7 C), temperature source Oral, resp. rate 18, height 5\' 10"  (1.778 m), weight 167 lb (75.751 kg), SpO2 100 %. Body mass index is 23.96 kg/(m^2). General: Well developed, well nourished, in no acute distress. Anxious appearing.  Head: Normocephalic, atraumatic, sclera non-icteric, no xanthomas, nares  are without discharge.  Neck: Negative for carotid bruits. JVD not elevated. Lungs: Clear bilaterally to auscultation without wheezes, rales, or rhonchi. Breathing is unlabored. Heart: RRR with S1 S2. No murmurs, rubs, or gallops appreciated. Abdomen: Soft, non-tender, non-distended with normoactive bowel sounds. No hepatomegaly. No rebound/guarding. No obvious abdominal masses. Msk:  Strength and tone appear normal for age. Extremities: No clubbing or cyanosis. No edema.  Distal pedal pulses are 2+ and equal bilaterally. Neuro: Alert and oriented X 3. No facial asymmetry. No focal deficit. Moves all extremities spontaneously. Psych:  Responds to questions appropriately with a normal affect.    Assessment and Plan:   1. Chronic atypical chest pain: -Patient with long standing symptoms s/p CABG -Troponin negative x 2 so far -He is still with chest pain, will place nitro paste 0.5 inch q 12 hours -Recent cardiac cath 12/2014 at [facility] showing nonobstructive coronary disease  -It was felt at that time at [facility] that his symptoms were not cardiac and alternative etiologies should be investigated for his symptoms -He does drink mostly coffee, tea, and soda during the day without any water, thus increasing the possibility for reflux and ulcerative gastritis  -He reports he does not think he has reflux, though the does have a long standing dry cough which is a symptom of reflux -Interestingly, he does have some ECG changes when checking serial ECG's along leads III and V6 which are new for him -Given the above ECG changes and his symptoms check an echo to evaluate LV function and wall motion -The patient himself reports increased anxiety, perhaps this is playing a role along with some degree of  enabling    2. Dizziness: -He is quite dizzy just with sitting up slightly from a supine position  -It is a high probability that he is dehydrated and orthostatic given he does not drink water and only drinks  caffeine  -Check orthostatic vital signs -Hydrate as needed  3. CAD s/p CABG and PCI as above: -Echo as above -Continue aspirin 81 mg, Lopressor 25 mg bid, Lipitor 80 mg daily -As above  4. PAF: -Currently in sinus rhythm -Lopressor as above -Eliquis 5 mg bid -CHADS2VASc at least 4 (CHF, HTN, age x 1, vascular disease)  5. Ischemic cardiomyopathy: -He does not appear volume overloaded at this time -Continue Lopressor as above -Consider lisinopril and spironolactone as an outpatient  6. HTN: -Improved -Continue meds as above  7. HLD: -Lipitor 80 mg  8. Anxiety: -Needs PCP follow up   Signed, Christell Faith, PA-C Pager: [phone] 02/08/2015, 2:51 PM

## 2015-02-08 NOTE — Telephone Encounter (Signed)
S/w pt who reports intermittent chest pain and pressure accompanied by SOB over the past few weeks.  Denies left arm pain, nausea, diaphoresis. States symptoms were "bad" last evening. He took two nitro last evening and pain was relieved. Symptomatic this morning so he took two more nitro this time.  Pain was unrelieved.  He called EMS and states upon arrival, they did an EKG which he reports was normal however, he is concerned that something is wrong. EMS is not with him at this time.  He is currently experiencing chest pain. Advised pt to seek medical attention d/t active chest pain. He states his wife is home with him. Advised them to proceed to the ER or call EMS. Pt verbalized understanding and will go to the ER at this time. Will make Dr. Rockey Situ aware.

## 2015-02-08 NOTE — ED Notes (Signed)
MD at bedside to check on pt.

## 2015-02-08 NOTE — Progress Notes (Signed)
*  PRELIMINARY RESULTS* Echocardiogram 2D Echocardiogram has been performed.  Scott [name] 02/08/2015, 4:41 PM

## 2015-02-08 NOTE — ED Notes (Signed)
X-ray at bedside

## 2015-02-08 NOTE — Consult Note (Signed)
ANTICOAGULATION CONSULT NOTE - Initial Consult  Pharmacy Consult for Heparin Drip  Indication: chest pain/ACS  Allergies  Allergen Reactions  . Contrast Media  [Iodinated Diagnostic Agents] Hives  . Oxycodone Hcl Itching    Patient Measurements: Weight: 170 lb (77.111 kg) Heparin Dosing Weight: 77.1 kg  Vital Signs: Temp: 98.1 F (36.7 C) (01/16 1032) BP: 155/97 mmHg (01/16 1126) Pulse Rate: 63 (01/16 1126)  Labs:  Recent Labs  02/08/15 1024  HGB 16.2  HCT 49.5  PLT 221  APTT 28  LABPROT 14.0  INR 1.06  CREATININE 1.28*  TROPONINI 0.03    Estimated Creatinine Clearance: 53.9 mL/min (by C-G formula based on Cr of 1.28).   Medical History: Past Medical History  Diagnosis Date  . Cardiomyopathy, ischemic     a. 04/2012 Echo: EF 40-45%;  b. 08/2013 Echo: EF 45-50%, mild glob HK, mod lat/post HK, diast dysfxn, mildly dil LA, mild MR, nl RVSP.  Marland Kitchen Hyperlipidemia   . Carotid arterial disease ([place])     a. 05/2013 Carotid U/S; bilat 40-50% ICA stenosis.  Marland Kitchen CAD (coronary artery disease)     a. 04/2012 Cath: 3VD->Med Rx;  b. 04/2013 Cath/PCI: LAD 50p, D1 40, LCX 80p, OM2 99diff, RCA 70p, 74m, 95d. Mid & distal RCA treated with 2 DES.  . MI, old     Assessment: Pharmacy consulted to begin Heparin infusion for ACS/STEMI for this 75 yo male.   Goal of Therapy:  Heparin level 0.3-0.7 units/ml  Plan:  Give 4000 units bolus x 1, followed by Heparin drip at 900 units/hr.   Obtain Anti-Xa level 8 hours after infusion has started.   Check Hemoglobin and Platelets daily.     [place] Clinical Pharmacist 02/08/2015,11:49 AM

## 2015-02-08 NOTE — ED Notes (Signed)
Pt resting in bed, family at bedside, pt states "I just feel like I am in a fog", awake and alert

## 2015-02-08 NOTE — H&P (Signed)
[facility] NAME: Scott [name]    MR#:  [id number]  DATE OF BIRTH:  April 11, 1940  DATE OF ADMISSION:  02/08/2015  PRIMARY CARE PHYSICIAN: Madelyn Brunner, MD   REQUESTING/REFERRING PHYSICIAN: Dr. Dineen Kid  CHIEF COMPLAINT: Chest pain    Chief Complaint  Patient presents with  . Chest Pain    HISTORY OF PRESENT ILLNESS:  Scott [name]  is a 75 y.o. male with a known history of carotid artery disease, bypass surgery, with recent cardiac catheter in December 2016 at [facility] showing nonobstructive coronary artery disease comes in with chest pain. complaints of chest pain middle of the chest  Since  yesterday, took aspirin, Advil, some nitrates. He says nitroglycerin helped him. Chest pain not radiating to the jaw, arm. No cough, no fever. No orthopnea or PND. Patient did have some shortness of breath with chest painToday. Patient was at [facility] recently in December for the same, had a cardiac catheterization done which showed nonobstructive coronary artery disease. And he was thought to have chest pain due to musculoskeletal origin and advised him to take NSAID's.Marland KitchenMarland KitchenPatient started on heparin drip, nitro drip by her physician. I spoke with Dr. Fletcher Anon ,discussed the cardiac catheter findings from December 2016 , he suggested to discontinue both.  Past Medical History  Diagnosis Date  . Cardiomyopathy, ischemic     a. 04/2012 Echo: EF 40-45%;  b. 08/2013 Echo: EF 45-50%, mild glob HK, mod lat/post HK, diast dysfxn, mildly dil LA, mild MR, nl RVSP.  Marland Kitchen Hyperlipidemia   . Carotid arterial disease ([place])     a. 05/2013 Carotid U/S; bilat 40-50% ICA stenosis.  Marland Kitchen CAD (coronary artery disease)     a. 04/2012 Cath: 3VD->Med Rx;  b. 04/2013 Cath/PCI: LAD 50p, D1 40, LCX 80p, OM2 99diff, RCA 70p, 34m, 95d. Mid & distal RCA treated with 2 DES.  . MI, old     PAST SURGICAL HISTOIRY:   Past Surgical History  Procedure Laterality Date  . Right shoulder     . Carpal tunnel release      right hand  . Tonsillectomy    . Cardiac catheterization  05/01/2012  . Cardiac catheterization  04/2013    [facility];x3 stent  . Coronary angioplasty with stent placement  04/13/2014  . Coronary artery bypass graft  06-26-14  . Cataract extraction w/phaco Left 09/16/2014    Procedure: CATARACT EXTRACTION PHACO AND INTRAOCULAR LENS PLACEMENT (IOC);  Surgeon: Leandrew Koyanagi, MD;  Location: [facility];  Service: Ophthalmology;  Laterality: Left;    SOCIAL HISTORY:   Social History  Substance Use Topics  . Smoking status: Former Smoker -- 1.00 packs/day for 35 years    Types: Cigarettes    Start date: 05/02/2009  . Smokeless tobacco: Never Used  . Alcohol Use: No    FAMILY HISTORY:   Family History  Problem Relation Age of Onset  . Heart attack Father 58    MI  . Cancer Maternal Uncle     DRUG ALLERGIES:   Allergies  Allergen Reactions  . Contrast Media  [Iodinated Diagnostic Agents] Hives  . Oxycodone Hcl Itching    REVIEW OF SYSTEMS:  CONSTITUTIONAL: No fever, fatigue or weakness.  EYES: No blurred or double vision.  EARS, NOSE, AND THROAT: No tinnitus or ear pain.  RESPIRATORY: No cough, shortness of breath, wheezing or hemoptysis.  CARDIOVASCULAR: chest pain earlier today. GASTROINTESTINAL: No nausea, vomiting, diarrhea or abdominal pain.  GENITOURINARY:  No dysuria, hematuria.  ENDOCRINE: No polyuria, nocturia,  HEMATOLOGY: No anemia, easy bruising or bleeding SKIN: No rash or lesion. MUSCULOSKELETAL: No joint pain or arthritis.   NEUROLOGIC: No tingling, numbness, weakness.  PSYCHIATRY: anxious  MEDICATIONS AT HOME:   Prior to Admission medications   Medication Sig Start Date End Date Taking? Authorizing Provider  apixaban (ELIQUIS) 5 MG TABS tablet Take 5 mg by mouth 2 (two) times daily.   Yes Historical Provider, MD  atorvastatin (LIPITOR) 80 MG tablet Take 80 mg by mouth at bedtime.   Yes Historical Provider, MD   metoprolol tartrate (LOPRESSOR) 25 MG tablet Take 25 mg by mouth 2 (two) times daily.   Yes Historical Provider, MD  nitroGLYCERIN (NITROSTAT) 0.4 MG SL tablet Place 1 tablet (0.4 mg total) under the tongue every 5 (five) minutes as needed for chest pain. 09/05/13  Yes Minna Merritts, MD  omeprazole (PRILOSEC) 20 MG capsule Take 20 mg by mouth 2 (two) times daily.   Yes Historical Provider, MD  ranitidine (ZANTAC) 300 MG tablet Take 300 mg by mouth at bedtime.   Yes Historical Provider, MD  zolpidem (AMBIEN) 5 MG tablet Take 5 mg by mouth at bedtime as needed for sleep.    Yes Historical Provider, MD  aspirin EC 81 MG tablet Take 1 tablet (81 mg total) by mouth daily. Patient taking differently: Take 81 mg by mouth at bedtime.  05/17/13   Wellington Hampshire, MD      VITAL SIGNS:  Blood pressure 130/92, pulse 59, temperature 98.1 F (36.7 C), resp. rate 15, weight 77.111 kg (170 lb), SpO2 99 %.  PHYSICAL EXAMINATION:  GENERAL:  75 y.o.-year-old patient lying in the bed with no acute distress.  EYES: Pupils equal, round, reactive to light and accommodation. No scleral icterus. Extraocular muscles intact.  HEENT: Head atraumatic, normocephalic. Oropharynx and nasopharynx clear.  NECK:  Supple, no jugular venous distention. No thyroid enlargement, no tenderness.  LUNGS: Normal breath sounds bilaterally, no wheezing, rales,rhonchi or crepitation. No use of accessory muscles of respiration.  CARDIOVASCULAR: S1, S2 normal. No murmurs, rubs, or gallops.  ABDOMEN: Soft, nontender, nondistended. Bowel sounds present. No organomegaly or mass.  EXTREMITIES: No pedal edema, cyanosis, or clubbing.  NEUROLOGIC: Cranial nerves II through XII are intact. Muscle strength 5/5 in all extremities. Sensation intact. Gait not checked.  PSYCHIATRIC: The patient is alert and oriented x 3.  SKIN: No obvious rash, lesion, or ulcer.   LABORATORY PANEL:   CBC  Recent Labs Lab 02/08/15 1024  WBC 4.3  HGB 16.2   HCT 49.5  PLT 221   ------------------------------------------------------------------------------------------------------------------  Chemistries   Recent Labs Lab 02/08/15 1024  NA 141  K 4.5  CL 106  CO2 27  GLUCOSE 109*  BUN 17  CREATININE 1.28*  CALCIUM 9.6   ------------------------------------------------------------------------------------------------------------------  Cardiac Enzymes  Recent Labs Lab 02/08/15 1024  TROPONINI 0.03   ------------------------------------------------------------------------------------------------------------------  RADIOLOGY:  Dg Chest Port 1 View  02/08/2015  CLINICAL DATA:  Mid sternal chest pain since 5 a.m. EXAM: PORTABLE CHEST 1 VIEW COMPARISON:  12/07/2014 FINDINGS: Prior CABG. Heart is normal size. Lungs are clear. No effusions or acute bony abnormality. IMPRESSION: No active disease. Electronically Signed   By: Rolm Baptise M.D.   On: 02/08/2015 11:47    EKG:   Orders placed or performed during the hospital encounter of 02/08/15  . ED EKG within 10 minutes  . ED EKG within 10 minutes  . EKG 12-Lead  .  EKG 12-Lead   normal sinus rhythm 73 bpm, no ST T changes.  IMPRESSION AND PLAN:   1. chest pain: Sounds atypical. Likely secondary to anxiety and GERD: Because of history of his coronary artery disease and CABG monitored overnight on telemetry, cardiology consult, continue aspirin, beta blockers, statins, nitrates. Cycle the troponins. Add cardiac catheter recently in December at [facility] showing nonobstructive coronary artery disease. And he was followed by the [facility] cardiology for the same problem and he was advised to continue NSAID's, GERD treatment.Marland Kitchen discontinue heparin drip, nitro drip at this time, discussed this with cardiology on-call doctor Dr>Arida.. Because of normal troponins, negative EKG changes we don't think he needs both of them at this time. He is fully anticoagulated with the  Eliquis. start  Nitropatch. #2 proximal atrial fibrillation: Rate controlled: Continue beta blockers, Epixiban,.  3. chronic diastolic heart failure: Continue aspirin, metoprolol at this time. #4 .mild acute renal failure: Advised hydration. And given 1 L bolus of normal Saline. . The medical records in care everywhere, reviewed the cardiac catheter report also from [place], total time in reviewing the records 20 minutes  All the records are reviewed and case discussed with ED provider. Management plans discussed with the patient, family and they are in agreement.  CODE STATUS: full  TOTAL TIME TAKING CARE OF THIS PATIENT: 50 minutes.  Plus 20 min in reviewing records   Alissia Lory M.D on 02/08/2015 at 1:08 PM  Between 7am to 6pm - Pager - [phone]  After 6pm go to [email or web address] - password [email or web address] [place] [facility]  Office  [phone]  CC: Primary care physician; Madelyn Brunner, MD  Note: This dictation was prepared with Dragon dictation along with smaller phrase technology. Any transcriptional errors that result from this process are unintentional.

## 2015-02-08 NOTE — Care Management Obs Status (Signed)
[facility] NOTIFICATION   Patient Details  Name: Scott [name] MRN: [id number] Date of Birth: 09/25/1940   Medicare Observation Status Notification Given:  Yes    Beau Fanny, RN 02/08/2015, 1:13 PM

## 2015-02-08 NOTE — ED Notes (Signed)
While MD at bedside pt grabbed his wife and states "I dont feel good, like I am going to pass out", BP and EKG repeated, nitro gtt increased, family at bedside

## 2015-02-08 NOTE — ED Provider Notes (Signed)
[facility] Emergency Department Provider Note  ____________________________________________  Time seen: Approximately [id number] AM  I have reviewed the triage vital signs and the nursing notes.   HISTORY  Chief Complaint Chest Pain    HPI Scott [name] is a 75 y.o. male with a history of coronary artery disease with stenting and then bypass surgery this past June who is presenting today with about a week's worth of intermittent chest pain. He describes the chest pain as squeezing which is associated with diaphoresis. He says it is in the center of his chest and nonradiating and an 8 out of 10 at this time. There is no associated nausea or vomiting. He says that he took nitroglycerin last night which helped the pain but says that he tried nitroglycerin this morning which did not relieve his pain at all. He says he took his Xarelto this morning and also his aspirin, 81 mg, last night. He says that this pain feels worse than his previous chest pain that he had last spring that triggered his workup for his coronary artery disease. Is a patient ofDR. Gollan here at [facility].Denies any radiation of the chest pain.   Past Medical History  Diagnosis Date  . Cardiomyopathy, ischemic     a. 04/2012 Echo: EF 40-45%;  b. 08/2013 Echo: EF 45-50%, mild glob HK, mod lat/post HK, diast dysfxn, mildly dil LA, mild MR, nl RVSP.  Marland Kitchen Hyperlipidemia   . Carotid arterial disease ([place])     a. 05/2013 Carotid U/S; bilat 40-50% ICA stenosis.  Marland Kitchen CAD (coronary artery disease)     a. 04/2012 Cath: 3VD->Med Rx;  b. 04/2013 Cath/PCI: LAD 50p, D1 40, LCX 80p, OM2 99diff, RCA 70p, 51m, 95d. Mid & distal RCA treated with 2 DES.  . MI, old     Patient Active Problem List   Diagnosis Date Noted  . Chronic diastolic heart failure ([place]) 12/21/2014  . Unstable angina ([place]) 12/08/2014  . PAF (paroxysmal atrial fibrillation) ([place]) 12/08/2014  . Chronic anticoagulation 12/08/2014  . Anemia 04/20/2014  .  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 Cardiomyopathy, ischemic   . HTN (hypertension)   . Carotid arterial disease ([place])   . S/P drug eluting coronary stent placement 05/30/2013  . Chest pain 05/05/2013  . SOB (shortness of breath) 05/05/2013  . CAD (coronary artery disease) 05/05/2013  . Hyperlipidemia 05/05/2013  . History of smoking 05/05/2013  . 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 Right shoulder    . Carpal tunnel release      right hand  . Tonsillectomy    . Cardiac catheterization  05/01/2012  . Cardiac catheterization  04/2013    [facility];x[facility] stent  . Coronary angioplasty with stent placement  04/13/2014  . Coronary artery bypass graft  06-26-14  . Cataract extraction w/phaco Left 09/16/2014    Procedure: CATARACT EXTRACTION PHACO AND INTRAOCULAR LENS PLACEMENT (IOC);  Surgeon: Leandrew Koyanagi, MD;  Location: [facility];  Service: Ophthalmology;  Laterality: Left;    Current Outpatient Rx  Name  Route  Sig  Dispense  Refill  . apixaban (ELIQUIS) 5 MG TABS tablet   Oral   Take 5 mg by mouth 2 (two) times daily.         Marland Kitchen atorvastatin (LIPITOR) 80 MG tablet   Oral   Take 80 mg by mouth at bedtime.         . metoprolol tartrate (LOPRESSOR) 25 MG tablet  Oral   Take 25 mg by mouth 2 (two) times daily.         . nitroGLYCERIN (NITROSTAT) 0.4 MG SL tablet   Sublingual   Place 1 tablet (0.4 mg total) under the tongue every 5 (five) minutes as needed for chest pain.   90 tablet   3   . omeprazole (PRILOSEC) 20 MG capsule   Oral   Take 20 mg by mouth 2 (two) times daily.         . ranitidine (ZANTAC) 300 MG tablet   Oral   Take 300 mg by mouth at bedtime.         Marland Kitchen zolpidem (AMBIEN) 5 MG tablet   Oral   Take 5 mg by mouth at bedtime as needed for sleep.          Marland Kitchen aspirin EC 81 MG tablet   Oral   Take 1 tablet (81 mg total) by mouth daily. Patient taking differently: Take 81 mg by mouth at  bedtime.            Allergies Contrast media  and Oxycodone hcl  Family History  Problem Relation Age of Onset  . Heart attack Father 47    MI  . Cancer Maternal Uncle     Social History Social History  Substance Use Topics  . Smoking status: Former Smoker -- 1.00 packs/day for 35 years    Types: Cigarettes    Start date: 05/02/2009  . Smokeless tobacco: Never Used  . Alcohol Use: No    Review of Systems Constitutional: No fever/chills Eyes: No visual changes. ENT: No sore throat. Cardiovascular: As above. Respiratory: As above Gastrointestinal: No abdominal pain.  No nausea, no vomiting.  No diarrhea.  No constipation. Genitourinary: Negative for dysuria. Musculoskeletal: Negative for back pain. Skin: Negative for rash. Neurological: Negative for headaches, focal weakness or numbness.  10-point ROS otherwise negative.  ____________________________________________   PHYSICAL EXAM:  VITAL SIGNS: ED Triage Vitals  Enc Vitals Group     BP 02/08/15 1032 156/75 mmHg     Pulse Rate 02/08/15 1032 66     Resp 02/08/15 1032 20     Temp 02/08/15 1032 98.1 F (36.7 C)     Temp src --      SpO2 02/08/15 1032 100 %     Weight --      Height --      Head Cir --      Peak Flow --      Pain Score 02/08/15 1022 7     Pain Loc --      Pain Edu? --      Excl. in [place]? --     Constitutional: Alert and oriented. Patient appears uncomfortable. Eyes: Conjunctivae are normal. PERRL. EOMI. Head: Atraumatic. Nose: No congestion/rhinnorhea. Mouth/Throat: Mucous membranes are moist.  Oropharynx non-erythematous. Neck: No stridor.   Cardiovascular: Normal rate, regular rhythm. Grossly normal heart sounds.  Good peripheral circulation. Bilateral radial as well as dorsalis pedis pulses are present and equal bilaterally. Respiratory: Normal respiratory effort.  No retractions. Lungs CTAB. Gastrointestinal: Soft and nontender. No distention. No abdominal bruits. No CVA  tenderness. Musculoskeletal: No lower extremity tenderness nor edema.  No joint effusions. Neurologic:  Normal speech and language. No gross focal neurologic deficits are appreciated. No gait instability. Skin:  Skin is warm, dry and intact. No rash noted. Psychiatric: Mood and affect are normal. Speech and behavior are normal.  ____________________________________________   LABS (all labs ordered  are listed, but only abnormal results are displayed)  Labs Reviewed  BASIC METABOLIC PANEL - Abnormal; Notable for the following:    Glucose, Bld 109 (*)    Creatinine, Ser 1.28 (*)    GFR calc non Af Amer 54 (*)    All other components within normal limits  CBC - Abnormal; Notable for the following:    RDW 15.5 (*)    All other components within normal limits  TROPONIN I  PROTIME-INR  APTT   ____________________________________________  EKG  ED ECG REPORT I, Doran Stabler, the attending physician, personally viewed and interpreted this ECG.   Date: 02/08/2015  EKG Time: 10:21 AM  Rate: 73  Rhythm: normal sinus rhythm  Axis: Normal axis  Intervals:none  ST&T Change: No ST segment elevation or depression. No abnormal T-wave inversion.  ED ECG REPORT I, Doran Stabler, the attending physician, personally viewed and interpreted this ECG.   Date: 02/08/2015  EKG Time: 1043 AM  Rate: 71  Rhythm: normal sinus rhythm  Axis: Normal axis  Intervals:nonspecific intraventricular conduction delay  ST&T Change: No ST segment elevation or depression. T-wave scooping in 3, aVF as well as V5 and V6.  ED ECG REPORT I, Doran Stabler, the attending physician, personally viewed and interpreted this ECG.   Date: 02/08/2015  EKG Time: 10:54 AM  Rate: 73  Rhythm: normal sinus rhythm  Axis: Normal axis  Intervals: Nonspecific intraventricular conduction delay  ST&T Change: No ST elevations or depressions. T-wave scooping is seen similar to before in 3, aVF as well as V5  and V6.        ____________________________________________  RADIOLOGY  No active disease on the chest x-ray. I personally reviewed this film. ____________________________________________   PROCEDURES  CRITICAL CARE Performed by: Doran Stabler   Total critical care time: 35 minutes  Critical care time was exclusive of separately billable procedures and treating other patients.  Critical care was necessary to treat or prevent imminent or life-threatening deterioration.  Critical care was time spent personally by me on the following activities: development of treatment plan with patient and/or surrogate as well as nursing, discussions with consultants, evaluation of patient's response to treatment, examination of patient, obtaining history from patient or surrogate, ordering and performing treatments and interventions, ordering and review of laboratory studies, ordering and review of radiographic studies, pulse oximetry and re-evaluation of patient's condition.  ____________________________________________   INITIAL IMPRESSION / ASSESSMENT AND PLAN / ED COURSE  Pertinent labs & imaging results that were available during my care of the patient were reviewed by me and considered in my medical decision making (see chart for details).  ----------------------------------------- 11:05 AM on 02/08/2015 -----------------------------------------  Discussed the case with Dr. Velva Harman who is familiar with the patient. Dr. read also reviewed the EKGs. He agrees with starting nitroglycerin as well as heparin empirically. Aware the patient is on Xarelto.  ----------------------------------------- 11:18 AM on 02/08/2015 -----------------------------------------  Patient at this time with pain reduced to a 4 out of 10 and feeling better after morphine. We'll start on nitroglycerin as well as heparin drips.  ----------------------------------------- 12:09 PM on  02/08/2015 -----------------------------------------  Patient reassessed and pain is improved with nitroglycerin and morphine. He still says his pain is about a 3-4 but is greatly improved since arriving to the emergency department. Aware of need for admission to the hospital. I do believe that he is having unstable angina and will need close monitoring. He is aware of  this as well as his family as well as the need for admission. Signed out to Dr.[place].   ____________________________________________   FINAL CLINICAL IMPRESSION(S) / ED DIAGNOSES  Final diagnoses:  Chest pain   unstable angina.    Orbie Pyo, MD 02/08/15 1210

## 2015-02-08 NOTE — ED Notes (Addendum)
Pt arrives diaphoretic and apperaring uncomfortable, MD brought to bedside, pt states chest tightness for the past 3 days increasing today, states hx of cardiac stents and bypass, pain will come and go and will take pts breathe away, states SOB and nausea, pt on eliquis, pt took nitro SL before arrival

## 2015-02-08 NOTE — ED Notes (Signed)
Repeat EKG preformed, BP checked, pt states "I feel like I am drifting", pt tearful, pt pale and diaphoretic, MD made aware, family at bedside

## 2015-02-09 ENCOUNTER — Telehealth: Payer: Self-pay

## 2015-02-09 ENCOUNTER — Other Ambulatory Visit: Payer: Self-pay | Admitting: Cardiovascular Disease

## 2015-02-09 DIAGNOSIS — I10 Essential (primary) hypertension: Secondary | ICD-10-CM | POA: Diagnosis not present

## 2015-02-09 DIAGNOSIS — I482 Chronic atrial fibrillation: Secondary | ICD-10-CM | POA: Diagnosis not present

## 2015-02-09 DIAGNOSIS — I25701 Atherosclerosis of coronary artery bypass graft(s), unspecified, with angina pectoris with documented spasm: Secondary | ICD-10-CM | POA: Diagnosis not present

## 2015-02-09 DIAGNOSIS — I25811 Atherosclerosis of native coronary artery of transplanted heart without angina pectoris: Secondary | ICD-10-CM | POA: Diagnosis not present

## 2015-02-09 DIAGNOSIS — R0789 Other chest pain: Secondary | ICD-10-CM | POA: Diagnosis not present

## 2015-02-09 DIAGNOSIS — I209 Angina pectoris, unspecified: Secondary | ICD-10-CM | POA: Insufficient documentation

## 2015-02-09 DIAGNOSIS — F419 Anxiety disorder, unspecified: Secondary | ICD-10-CM | POA: Insufficient documentation

## 2015-02-09 DIAGNOSIS — E785 Hyperlipidemia, unspecified: Secondary | ICD-10-CM | POA: Insufficient documentation

## 2015-02-09 DIAGNOSIS — R69 Illness, unspecified: Secondary | ICD-10-CM | POA: Diagnosis not present

## 2015-02-09 LAB — BASIC METABOLIC PANEL
ANION GAP: 5 (ref 5–15)
BUN: 17 mg/dL (ref 6–20)
CHLORIDE: 105 mmol/L (ref 101–111)
CO2: 26 mmol/L (ref 22–32)
Calcium: 8.3 mg/dL — ABNORMAL LOW (ref 8.9–10.3)
Creatinine, Ser: 1.23 mg/dL (ref 0.61–1.24)
GFR calc Af Amer: >60 mL/min (ref >60)
GFR calc non Af Amer: 57 mL/min — ABNORMAL LOW (ref >60)
GLUCOSE: 98 mg/dL (ref 65–99)
POTASSIUM: 4 mmol/L (ref 3.5–5.1)
Sodium: 136 mmol/L (ref 135–145)

## 2015-02-09 LAB — GLUCOSE, CAPILLARY: Glucose-Capillary: 81 mg/dL (ref 65–99)

## 2015-02-09 LAB — CBC
HEMATOCRIT: 42.6 % (ref 40.0–52.0)
HEMOGLOBIN: 14 g/dL (ref 13.0–18.0)
MCH: 29.9 pg (ref 26.0–34.0)
MCHC: 33 g/dL (ref 32.0–36.0)
MCV: 90.6 fL (ref 80.0–100.0)
Platelets: 177 10*3/uL (ref 150–440)
RBC: 4.7 MIL/uL (ref 4.40–5.90)
RDW: 15.1 % — ABNORMAL HIGH (ref 11.5–14.5)
WBC: 4.3 10*3/uL (ref 3.8–10.6)

## 2015-02-09 LAB — TROPONIN I: Troponin I: <0.03 ng/mL (ref <0.031)

## 2015-02-09 MED ORDER — ISOSORBIDE MONONITRATE ER 30 MG PO TB24: 30.0000 mg | ORAL_TABLET | Freq: Every day | ORAL | Status: DC

## 2015-02-09 MED ORDER — NITROGLYCERIN 0.4 MG SL SUBL: 0.4000 mg | SUBLINGUAL_TABLET | SUBLINGUAL | Status: DC | PRN

## 2015-02-09 MED ORDER — METOPROLOL TARTRATE 25 MG PO TABS: 12.5000 mg | ORAL_TABLET | Freq: Two times a day (BID) | ORAL | Status: DC

## 2015-02-09 MED ORDER — ISOSORBIDE MONONITRATE ER 30 MG PO TB24
30.0000 mg | ORAL_TABLET | Freq: Every day | ORAL | Status: DC
  Administered 2015-02-09: 30 mg via ORAL
  Filled 2015-02-09: qty 1

## 2015-02-09 NOTE — Progress Notes (Signed)
Dr Rockey Situ notified. Pt's BP is 100/62. Imdur ordered and given per order. MD given BP trends. Pt will be discharged via wheelchair, family at bedside. IV's discontinued without incident, discharge information given. Reviewed home meds and follow-up appointment information. No question verbalized.

## 2015-02-09 NOTE — Progress Notes (Signed)
Patient: Scott [name] / Admit Date: 02/08/2015 / Date of Encounter: 02/09/2015, 9:00 AM   Subjective: Dizzy this AM, chest pain off and on at rest, Eating well. Has not ambulated, Echo complete, EF 45%, unchanged Cardiac enz negative   Review of Systems: Review of Systems  Constitutional: Negative.   Respiratory: Negative.   Cardiovascular: Positive for chest pain.  Gastrointestinal: Negative.   Musculoskeletal: Negative.   Neurological: Positive for dizziness.  Psychiatric/Behavioral: Negative.   All other systems reviewed and are negative.   Objective: Telemetry: sinus bradycardia Physical Exam: Blood pressure 125/71, pulse 58, temperature 97.8 F (36.6 C), temperature source Oral, resp. rate 18, height 5\' 10"  (1.778 m), weight 166 lb 4.8 oz (75.433 kg), SpO2 97 %. Body mass index is 23.86 kg/(m^2). General: Well developed, well nourished, in no acute distress. Head: Normocephalic, atraumatic, sclera non-icteric, no xanthomas, nares are without discharge. Neck: Negative for carotid bruits. JVP not elevated. Lungs: Clear bilaterally to auscultation without wheezes, rales, or rhonchi. Breathing is unlabored. Heart: RRR S1 S2 without murmurs, rubs, or gallops.  Abdomen: Soft, non-tender, non-distended with normoactive bowel sounds. No rebound/guarding. Extremities: No clubbing or cyanosis. No edema. Distal pedal pulses are 2+ and equal bilaterally. Neuro: Alert and oriented X 3. Moves all extremities spontaneously. Psych:  Responds to questions appropriately with a normal affect.   Intake/Output Summary (Last 24 hours) at 02/09/15 0900 Last data filed at 02/08/15 1900  Gross per 24 hour  Intake    240 ml  Output      0 ml  Net    240 ml    Inpatient Medications:  . apixaban  5 mg Oral BID  . aspirin EC  81 mg Oral QHS  . atorvastatin  80 mg Oral QHS  . docusate sodium  100 mg Oral BID  . famotidine  10 mg Oral Daily  . metoprolol tartrate  25 mg Oral BID  .  nitroGLYCERIN  0.5 inch Topical Q12H  . pantoprazole  40 mg Oral Daily   Infusions:    Labs:  Recent Labs  02/08/15 1024 02/09/15 0429  NA 141 136  K 4.5 4.0  CL 106 105  CO2 27 26  GLUCOSE 109* 98  BUN 17 17  CREATININE 1.28* 1.23  CALCIUM 9.6 8.3*   No results for input(s): AST, ALT, ALKPHOS, BILITOT, PROT, ALBUMIN in the last 72 hours.  Recent Labs  02/08/15 1024 02/09/15 0429  WBC 4.3 4.3  HGB 16.2 14.0  HCT 49.5 42.6  MCV 90.3 90.6  PLT 221 177    Recent Labs  02/08/15 1024 02/08/15 1323 02/08/15 1854 02/09/15 0055  TROPONINI 0.03 <0.03 <0.03 <0.03   Invalid input(s): POCBNP No results for input(s): HGBA1C in the last 72 hours.   Weights: Filed Weights   02/08/15 1142 02/08/15 1356 02/09/15 0525  Weight: 170 lb (77.111 kg) 167 lb (75.751 kg) 166 lb 4.8 oz (75.433 kg)     Radiology/Studies:  Dg Chest Port 1 View  02/08/2015  CLINICAL DATA:  Mid sternal chest pain since 5 a.m. EXAM: PORTABLE CHEST 1 VIEW COMPARISON:  12/07/2014 FINDINGS: Prior CABG. Heart is normal size. Lungs are clear. No effusions or acute bony abnormality. IMPRESSION: No active disease. Electronically Signed   By: Rolm Baptise M.D.   On: 02/08/2015 11:47     Assessment and Plan  75 y.o. male   1. Chronic atypical chest pain: -Patient with long standing symptoms s/p CABG -Troponin negative  -Recent cardiac  cath 12/2014 at [facility] showing nonobstructive coronary disease  -The patient himself reports increased anxiety -------will start imdur 30 mg daily if room on his BP this AM Will also start ranexa 500 mg po BID for one week, then up to 1000 mg po BID (we are trying to obtain samples), Would decrease the metoprolol back to 12.5 mg po BID 30 min spent talking with thepatient and family concerning symptoms and chest pain management  2. Dizziness: -He is quite dizzy just with sitting up slightly from a supine position  -It is a high probability that he is dehydrated and  orthostatic given he does not drink water and only drinks caffeine  Orthostatics normal, Would decrease metoprolol to 12.5 mg po BID  3. CAD s/p CABG and PCI as above:  Lopressor 25 mg bid, Lipitor 80 mg daily  4. PAF: -Currently in sinus rhythm -Lopressor as above -Eliquis 5 mg bid -CHADS2VASc at least 4 (CHF, HTN, age x 1, vascular disease)  5. Ischemic cardiomyopathy: -He does not appear volume overloaded at this time -Continue Lopressor as above, 12.5 mg po BID imdur 30 mg daily if BP tolerates  6. HTN: imdur if tolerated, Low dose metoprolol  7. HLD: -Lipitor 80 mg  8. Anxiety: -Needs PCP follow up   9. Insomnia Follow up with PMD  Signed, Esmond Plants, MD 02/09/2015, 9:00 AM

## 2015-02-09 NOTE — Discharge Instructions (Signed)
Heart healthy diet. °Activity as tolerated. °

## 2015-02-09 NOTE — Discharge Summary (Signed)
[facility] [place] NAME: Scott [name]    MR#:  [id number]  DATE OF BIRTH:  13-Feb-1940  DATE OF ADMISSION:  02/08/2015 ADMITTING PHYSICIAN: Epifanio Lesches, MD  DATE OF DISCHARGE: 02/09/2015  3:42 PM  PRIMARY CARE PHYSICIAN: Madelyn Brunner, MD    ADMISSION DIAGNOSIS:  Unstable angina ([place]) [I20.0] Chest pain [R07.9] Chest pain, unspecified chest pain type [R07.9]   DISCHARGE DIAGNOSIS:  Atypical chest pain  SECONDARY DIAGNOSIS:   Past Medical History  Diagnosis Date  . Cardiomyopathy, ischemic     a. 04/2012 Echo: EF 40-45%;  b. 08/2013 Echo: EF 45-50%, mild glob HK, mod lat/post HK, diast dysfxn, mildly dil LA, mild MR, nl RVSP.  Marland Kitchen Hyperlipidemia   . Carotid arterial disease ([place])     a. 05/2013 Carotid U/S; bilat 40-50% ICA stenosis.  Marland Kitchen CAD (coronary artery disease)     a. 04/2012 Cath: 3VD->Med Rx;  b. 04/2013 Cath/PCI: LAD 50p, D1 40, LCX 80p, OM2 99diff, RCA 70p, 58m, 95d. Mid & distal RCA treated with 2 DES.  . MI, old     HOSPITAL COURSE:   1. chest pain: Sounds atypical. Likely secondary to anxiety and GERD:  He has been treated with aspirin, beta blockers, statins, nitrates. Cycle the troponins are negative. He had cardiac catheter recently in December at [facility] showing nonobstructive coronary artery disease.  Echo: LV EF: 40% -  45%. Per Dr. Rockey Situ, start imdur, decrease Lopressor to 12.5 mg twice a day due to bradycardia. May need start Ranexa as outpatient. #2 proximal atrial fibrillation: Rate controlled: Continue Lopressor and Epixiban. 3. chronic diastolic heart failure: Continue aspirin, metoprolol at this time. #4 .mild acute renal failure: Advised hydration. And given 1 L bolus of normal Saline.  DISCHARGE CONDITIONS:   Stable, discharged to home today.  CONSULTS OBTAINED:  Treatment Team:  Wellington Hampshire, MD  DRUG ALLERGIES:   Allergies  Allergen Reactions  . Contrast Media  [Iodinated  Diagnostic Agents] Hives  . Oxycodone Hcl Itching    DISCHARGE MEDICATIONS:   Discharge Medication List as of 02/09/2015  3:16 PM    START taking these medications   Details  isosorbide mononitrate (IMDUR) 30 MG 24 hr tablet Take 1 tablet (30 mg total) by mouth daily., Starting 02/09/2015, Until Discontinued, Print      CONTINUE these medications which have CHANGED   Details  metoprolol tartrate (LOPRESSOR) 25 MG tablet Take 0.5 tablets (12.5 mg total) by mouth 2 (two) times daily., Starting 02/09/2015, Until Discontinued, Print      CONTINUE these medications which have NOT CHANGED   Details  apixaban (ELIQUIS) 5 MG TABS tablet Take 5 mg by mouth 2 (two) times daily., Until Discontinued, Historical Med    atorvastatin (LIPITOR) 80 MG tablet Take 80 mg by mouth at bedtime., Until Discontinued, Historical Med    omeprazole (PRILOSEC) 20 MG capsule Take 20 mg by mouth 2 (two) times daily., Until Discontinued, Historical Med    ranitidine (ZANTAC) 300 MG tablet Take 300 mg by mouth at bedtime., Until Discontinued, Historical Med    zolpidem (AMBIEN) 5 MG tablet Take 5 mg by mouth at bedtime as needed for sleep. , Until Discontinued, Historical Med    aspirin EC 81 MG tablet Take 1 tablet (81 mg total) by mouth daily., Starting 05/17/2013, Until Discontinued, OTC    nitroGLYCERIN (NITROSTAT) 0.4 MG SL tablet Place 1 tablet (0.4 mg total) under the tongue every 5 (  five) minutes as needed for chest pain., Starting 02/09/2015, Until Discontinued, Normal         DISCHARGE INSTRUCTIONS:    If you experience worsening of your admission symptoms, develop shortness of breath, life threatening emergency, suicidal or homicidal thoughts you must seek medical attention immediately by calling 911 or calling your MD immediately  if symptoms less severe.  You Must read complete instructions/literature along with all the possible adverse reactions/side effects for all the Medicines you take and  that have been prescribed to you. Take any new Medicines after you have completely understood and accept all the possible adverse reactions/side effects.   Please note  You were cared for by a hospitalist during your hospital stay. If you have any questions about your discharge medications or the care you received while you were in the hospital after you are discharged, you can call the unit and asked to speak with the hospitalist on call if the hospitalist that took care of you is not available. Once you are discharged, your primary care physician will handle any further medical issues. Please note that NO REFILLS for any discharge medications will be authorized once you are discharged, as it is imperative that you return to your primary care physician (or establish a relationship with a primary care physician if you do not have one) for your aftercare needs so that they can reassess your need for medications and monitor your lab values.    Today   SUBJECTIVE   Mild chest pain on and off.   VITAL SIGNS:  Blood pressure 100/62, pulse 73, temperature 98 F (36.7 C), temperature source Oral, resp. rate 18, height 5\' 10"  (1.778 m), weight 75.433 kg (166 lb 4.8 oz), SpO2 98 %.  I/O:   Intake/Output Summary (Last 24 hours) at 02/09/15 1835 Last data filed at 02/09/15 1343  Gross per 24 hour  Intake    600 ml  Output      0 ml  Net    600 ml    PHYSICAL EXAMINATION:  GENERAL:  75 y.o.-year-old patient lying in the bed with no acute distress.  EYES: Pupils equal, round, reactive to light and accommodation. No scleral icterus. Extraocular muscles intact.  HEENT: Head atraumatic, normocephalic. Oropharynx and nasopharynx clear.  NECK:  Supple, no jugular venous distention. No thyroid enlargement, no tenderness.  LUNGS: Normal breath sounds bilaterally, no wheezing, rales,rhonchi or crepitation. No use of accessory muscles of respiration.  CARDIOVASCULAR: S1, S2 normal. No murmurs, rubs, or  gallops.  ABDOMEN: Soft, non-tender, non-distended. Bowel sounds present. No organomegaly or mass.  EXTREMITIES: No pedal edema, cyanosis, or clubbing.  NEUROLOGIC: Cranial nerves II through XII are intact. Muscle strength 5/5 in all extremities. Sensation intact. Gait not checked.  PSYCHIATRIC: The patient is alert and oriented x 3.  SKIN: No obvious rash, lesion, or ulcer.   DATA REVIEW:   CBC  Recent Labs Lab 02/09/15 0429  WBC 4.3  HGB 14.0  HCT 42.6  PLT 177    Chemistries   Recent Labs Lab 02/09/15 0429  NA 136  K 4.0  CL 105  CO2 26  GLUCOSE 98  BUN 17  CREATININE 1.23  CALCIUM 8.3*    Cardiac Enzymes  Recent Labs Lab 02/09/15 0055  TROPONINI <0.03    Microbiology Results  No results found for this or any previous visit.  RADIOLOGY:  Dg Chest Port 1 View  02/08/2015  CLINICAL DATA:  Mid sternal chest pain since 5 a.m. EXAM:  PORTABLE CHEST 1 VIEW COMPARISON:  12/07/2014 FINDINGS: Prior CABG. Heart is normal size. Lungs are clear. No effusions or acute bony abnormality. IMPRESSION: No active disease. Electronically Signed   By: Rolm Baptise M.D.   On: 02/08/2015 11:47       I discussed with Dr.Gollan. Management plans discussed with the patient, his wife and they are in agreement.  CODE STATUS:     Code Status Orders        Start     Ordered   02/08/15 1305  Full code   Continuous     02/08/15 1307    Code Status History    Date Active Date Inactive Code Status Order ID Comments User Context   12/07/2014  2:23 PM 12/08/2014  6:52 PM Full Code [id number]  Fritzi Mandes, MD Inpatient    Advance Directive Documentation        Most Recent Value   Type of Advance Directive  Healthcare Power of Attorney, Living will   Pre-existing out of facility DNR order (yellow form or pink MOST form)     "MOST" Form in Place?        TOTAL TIME TAKING CARE OF THIS PATIENT: 35 minutes.    Demetrios Loll M.D on 02/09/2015 at 6:35 PM  Between 7am to 6pm -  Pager - [phone]  After 6pm go to [email or web address] - password [email or web address] [facility]  Office  [phone]  CC: Primary care physician; Madelyn Brunner, MD

## 2015-02-09 NOTE — Telephone Encounter (Signed)
Patient contacted regarding discharge from [facility] on 02/09/15.  Patient understands to follow up with Dr. Rockey Situ on 02/18/15 at 11:00 at [facility]. Patient understands discharge instructions? yes Patient understands medications and regiment? yes Patient understands to bring all medications to this visit? yes  Advised pt that we have samples of Ranexa that he can come by and pick up. He states that he just left the hospital and does not want to come back.  Advised him that I tried to catch him earlier, but he had already left the floor.  He will "try this other medicine and if it doesn't work,I'll pick them up on Friday". Asked him to call back if he does want the Ranexa.

## 2015-02-10 ENCOUNTER — Other Ambulatory Visit: Payer: Self-pay | Admitting: Specialist

## 2015-02-10 DIAGNOSIS — J8489 Other specified interstitial pulmonary diseases: Secondary | ICD-10-CM | POA: Diagnosis not present

## 2015-02-10 DIAGNOSIS — R0602 Shortness of breath: Secondary | ICD-10-CM

## 2015-02-10 DIAGNOSIS — R918 Other nonspecific abnormal finding of lung field: Secondary | ICD-10-CM

## 2015-02-18 ENCOUNTER — Encounter: Payer: Medicare Other | Admitting: Cardiovascular Disease

## 2015-02-19 ENCOUNTER — Ambulatory Visit (INDEPENDENT_AMBULATORY_CARE_PROVIDER_SITE_OTHER): Payer: Medicare HMO | Admitting: Nurse Practitioner

## 2015-02-19 ENCOUNTER — Encounter: Payer: Self-pay | Admitting: Nurse Practitioner

## 2015-02-19 VITALS — BP 132/78 | HR 58 | Ht 70.0 in | Wt 170.8 lb

## 2015-02-19 DIAGNOSIS — I255 Ischemic cardiomyopathy: Secondary | ICD-10-CM

## 2015-02-19 DIAGNOSIS — I5022 Chronic systolic (congestive) heart failure: Secondary | ICD-10-CM

## 2015-02-19 DIAGNOSIS — I25701 Atherosclerosis of coronary artery bypass graft(s), unspecified, with angina pectoris with documented spasm: Secondary | ICD-10-CM

## 2015-02-19 DIAGNOSIS — I480 Paroxysmal atrial fibrillation: Secondary | ICD-10-CM

## 2015-02-19 DIAGNOSIS — I25709 Atherosclerosis of coronary artery bypass graft(s), unspecified, with unspecified angina pectoris: Secondary | ICD-10-CM

## 2015-02-19 DIAGNOSIS — R079 Chest pain, unspecified: Secondary | ICD-10-CM | POA: Insufficient documentation

## 2015-02-19 NOTE — Patient Instructions (Addendum)
Medication Instructions:  Please increase your isosorbide to a whole pill daily Please call the office next week and give [place] an update on how you are feeling  Labwork: None  Testing/Procedures: None  Follow-Up: Your physician recommends that you schedule a follow-up appointment in: 2 months w/ Dr. Rockey Situ  If you need a refill on your cardiac medications before your next appointment, please call your pharmacy.

## 2015-02-19 NOTE — Progress Notes (Signed)
Office Visit    Patient Name: Scott Scott [name] Date of Encounter: 02/19/2015  Primary Care Provider:  Madelyn Brunner, MD Primary Cardiologist: Johnny Bridge, MD   Chief Complaint    75 year old Scott [name] with a history CAD status post coronary artery bypass grafting in June 2016 and chronic chest pain, who presents for follow-up.  Past Medical History    Past Medical History  Diagnosis Date  . Cardiomyopathy, ischemic     a. 04/2012 Echo: EF 40-45%;  b. 08/2013 Echo: EF 45-50%, mild glob HK, mod lat/post HK, diast dysfxn, mildly dil LA, mild MR, nl RVSP; c. 01/2015 Echo: EF 40-45%, Gr 1 DD, mildly dil LA.  Marland Kitchen Hyperlipidemia   . Carotid arterial disease ([place])     a. 05/2013 Carotid U/S; bilat 40-50% ICA stenosis.  Marland Kitchen CAD (coronary artery disease)     a. 04/2012 Cath: 3VD->Med Rx;  b. 04/2013 Cath/PCI: RCA 70p, 45m, 95d. Mid & distal RCA treated with 2 DES; c. 03/2014 PCI: LAD 80 (3.0x23 Xience Alpine DES), 30 ISR in RCA; d. 06/2014 CABG x 2 ([facility]) LIMA->LAD, VG->OM; e. 11/2014 Neg MV; f. 12/2014 Cath ([facility]): patent grafts->Med Rx.  . Chronic Chest Pain   . Chronic systolic CHF (congestive heart failure) ([place])     a. 01/2015 Echo: EF 40-45%.   Past Surgical History  Procedure Laterality Date  . Right shoulder    . Carpal tunnel release      right hand  . Tonsillectomy    . Cataract extraction w/phaco Left 09/16/2014    Procedure: CATARACT EXTRACTION PHACO AND INTRAOCULAR LENS PLACEMENT (IOC);  Surgeon: Leandrew Koyanagi, MD;  Location: [place];  Service: Ophthalmology;  Laterality: Left;  . Coronary artery bypass graft  06-26-14  . Cardiac catheterization  05/01/2012  . Cardiac catheterization  04/2013    [facility];x3 stent  . Coronary angioplasty with stent placement  04/13/2014  . Cardiac catheterization  01/11/15     [facility]    Allergies  Allergies  Allergen Reactions  . Contrast Media  [Iodinated Diagnostic Agents] Hives  . Oxycodone Hcl Itching    History of Present Illness      75 year old Scott [name] with the above complex past medical history. He has a history of CAD dating back to April 2014 at which time he was found to have severe distal RCA disease with left to right collaterals, a subtotally occluded left circumflex, and moderate, diffuse LAD disease. He was initially medically managed. In April 2015, he underwent repeat catheterization secondary to ongoing chest pain, showing stable moderate to severe disease. He then underwent successful RCA stenting. In March 2016, due to recurrent chest pain, he underwent successful stenting of the LAD. Unfortunate, he had recurrent chest pain again in June 2016 and was seen at [facility]. He underwent CABG 2 with a LIMA to the LAD and a vein graft to the obtuse marginal. Unfortunately, following CABG, he continued to have persistent/chronic low-level left-sided and midsternal chest pain. He had repeat hospitalizations and also evaluations with stress testing in November at [facility], which was negative, and catheterization at [facility] in December, which apparently showed patent grafts. He was most recently admitted to [facility] about 10 days ago with chest pain. Enzymes were negative. Medical therapy was recommended and he was placed on isosorbide mononitrate.  Since his discharge, he has been taking isosorbide mononitrate 15 mg daily and has not noticed much change in his baseline low level of chest pain. This is mostly along  the bridge of his sternal scar and is not essentially worse with palpation. It is often dull but he occasionally has sharp and shooting pain during the day. Pain sometimes worsens with exertion requiring sublingual nitroglycerin, however there is no predictability to this. He notes that there is some anxiety related with his discomfort and that he feels as though since his bypass surgery, his whole attitude has changed. He's noticed much less patience with people including his family members.  From a heart failure standpoint, he has done  well. He has not been having any significant dyspnea on exertion, PND, orthopnea, dizziness, syncope, edema, or early satiety. He does not weigh himself.  Home Medications    Prior to Admission medications   Medication Sig Start Date End Date Taking? Authorizing Provider  apixaban (ELIQUIS) 5 MG TABS tablet Take 5 mg by mouth 2 (two) times daily.   Yes Historical Provider, MD  aspirin EC 81 MG tablet Take 1 tablet (81 mg total) by mouth daily. Patient taking differently: Take 81 mg by mouth at bedtime.  05/17/13  Yes Wellington Hampshire, MD  atorvastatin (LIPITOR) 80 MG tablet Take 80 mg by mouth at bedtime.   Yes Historical Provider, MD  isosorbide mononitrate (IMDUR) 30 MG 24 hr tablet Take 1 tablet (30 mg total) by mouth daily. 02/09/15  Yes Demetrios Loll, MD  metoprolol tartrate (LOPRESSOR) 25 MG tablet Take 0.5 tablets (12.5 mg total) by mouth 2 (two) times daily. 02/09/15  Yes Demetrios Loll, MD  nitroGLYCERIN (NITROSTAT) 0.4 MG SL tablet Place 1 tablet (0.4 mg total) under the tongue every 5 (five) minutes as needed for chest pain. 02/09/15  Yes Minna Merritts, MD  omeprazole (PRILOSEC) 20 MG capsule Take 20 mg by mouth 2 (two) times daily.   Yes Historical Provider, MD  ranitidine (ZANTAC) 300 MG tablet Take 300 mg by mouth at bedtime.   Yes Historical Provider, MD  zolpidem (AMBIEN) 5 MG tablet Take 5 mg by mouth at bedtime as needed for sleep.    Yes Historical Provider, MD    Review of Systems    As above, patient has chronic low-level chest pain which is mostly around his surgical scar. He also occasionally has sharp and shooting pain. He denies dyspnea, PND, orthopnea, dizziness, syncope, edema, or early satiety. All other systems reviewed and are otherwise negative except as noted above.  Physical Exam    VS:  BP 132/78 mmHg  Pulse 58  Ht 5\' 10"  (1.778 m)  Wt 170 lb 12 oz (77.452 kg)  BMI 24.50 kg/m2 , BMI Body mass index is 24.5 kg/(m^2). GEN: Well nourished, well developed, in no acute  distress. HEENT: normal. Neck: Supple, no JVD, carotid bruits, or masses. Cardiac: RRR, no murmurs, rubs, or gallops. No clubbing, cyanosis, edema.  Radials/DP/PT 2+ and equal bilaterally. No chest wall pain upon palpation. Respiratory:  Respirations regular and unlabored, clear to auscultation bilaterally. GI: Soft, nontender, nondistended, BS + x 4. MS: no deformity or atrophy. Skin: warm and dry, no rash. Neuro:  Strength and sensation are intact. Psych: Normal affect.  Accessory Clinical Findings    ECG - sinus bradycardia, 58, left axis, IVCD with delayed R-wave progression. No acute ST or T changes.  Assessment & Plan    1.  Coronary artery disease status post coronary artery bypass grafting/chronic chest pain: Patient is status post CABG 2 in June at [facility] and has since had a negative stress test in November and catheterization in December  which apparently showed patent grafts-this was at [facility]. He was most recently hospitalized in mid January with recurrent chest pain and negative enzymes. He has been placed on isosorbide mononitrate and he hasn't noticed much difference in his symptoms. We discussed at length today how it is very possible that his pain may not be related to coronary artery disease at all and may in fact be neuropathy secondary to his bypass surgery. As it is sometimes nitrate responsive however, one must continue to question the role of microvascular angina. As result, I will increase his isosorbide mononitrate to 30 mg daily. I encouraged him to call us in 1 week to let us know how he is doing at which point, we could increase to 60 mg daily if he continues to have discomfort. I would continue to titrate his isosorbide as we are able prior to adding Ranexa. I would also have a low threshold to refer to pain clinic if antianginal's do not improve his symptoms. He otherwise remains on aspirin, statin, and beta blocker therapy.  2. Hyperlipidemia: Continue statin.  3.  Paroxysmal atrial fibrillation: He is in sinus rhythm today. Continue eliquis for anticoagulation in the setting of a CHA2DS2VASc of 3.  4. Ischemic cardiopathy/chronic systolic congestive heart failure: He is euvolemic on exam and on a beta blocker. He is not on an ACE inhibitor at this time. With ongoing titration of nitrate, follow blood pressure and consider adding ACE inhibitor in the future.  5. Anxiety: Patient admits to anxiety related to symptoms. We discussed that he may benefit from anxiolytic therapy, however he is not interested.  6. Disposition: Follow-up with Dr. Sherlean Foot in 2-3 months or sooner if necessary.   Murray Hodgkins, NP 02/19/2015, 12:40 PM

## 2015-02-23 DIAGNOSIS — K219 Gastro-esophageal reflux disease without esophagitis: Secondary | ICD-10-CM | POA: Diagnosis not present

## 2015-03-01 ENCOUNTER — Telehealth: Payer: Self-pay | Admitting: *Deleted

## 2015-03-01 NOTE — Telephone Encounter (Signed)
Pt c/o Shortness Of Breath: STAT if SOB developed within the last 24 hours or pt is noticeably SOB on the phone  1. Are you currently SOB (can you hear that pt is SOB on the phone)? Yes and hurting in the chest (0-10 scale) is at a 5/6  2. How long have you been experiencing SOB? 15 - 20 min  3. Are you SOB when sitting or when up moving around? Patient walked a mile and started after walking   4. Are you currently experiencing any other symptoms? no

## 2015-03-01 NOTE — Telephone Encounter (Signed)
Spoke w/ pt.  He reports that he saw Ignacia Bayley, NP last week and was advised to call back in 1 week to let us know how he is doing. He reports that he walks daily, today he felt SOB and had CP, so he sat down on a little wall until his sx resolved.  On pt's last ov, Gerald Stabs documented:  "I will increase his isosorbide mononitrate to 30 mg daily. I encouraged him to call us in 1 week to let us know how he is doing at which point, we could increase to 60 mg daily if he continues to have discomfort. I would continue to titrate his isosorbide as we are able prior to adding Ranexa. I would also have a low threshold to refer to pain clinic if antianginal's do not improve his symptoms. "  Pt would like Dr. Donivan Scull opinion on which of these he recommends, as his sx have not improved. Advised him that I will make Dr. Rockey Situ aware of his concerns and call him back w/ his recommendations.

## 2015-03-01 NOTE — Telephone Encounter (Signed)
If there is room on his blood pressure, could increase isosorbide up to 30 mg twice a day  In addition I think it is time to try ranexa 500 mg twice a day for 1 week then up to 1000 mg twice a day Would do a trial of ranexa for at least 3 weeks to see if this helps  If he is not reactive, would recommend cardiac rehabilitation This can help build small vessel collaterals to help with chronic chest pain

## 2015-03-02 ENCOUNTER — Other Ambulatory Visit: Payer: Self-pay

## 2015-03-02 MED ORDER — ISOSORBIDE MONONITRATE ER 30 MG PO TB24: 30.0000 mg | ORAL_TABLET | Freq: Two times a day (BID) | ORAL | Status: DC

## 2015-03-02 MED ORDER — RANOLAZINE ER 1000 MG PO TB12: 1000.0000 mg | ORAL_TABLET | Freq: Two times a day (BID) | ORAL | Status: DC

## 2015-03-02 NOTE — Telephone Encounter (Signed)
Spoke w/ pt.  Advised him of Dr. Donivan Scull recommendation.  He is agreeable and will stop by at his convenience to p/u Ranexa samples. Asked him to call back and let us know how he is doing on them.

## 2015-03-03 ENCOUNTER — Other Ambulatory Visit: Payer: Self-pay | Admitting: Cardiovascular Disease

## 2015-03-04 ENCOUNTER — Other Ambulatory Visit: Payer: Self-pay | Admitting: *Deleted

## 2015-03-04 MED ORDER — ISOSORBIDE MONONITRATE ER 30 MG PO TB24
ORAL_TABLET | ORAL | Status: DC
Start: 2015-03-04 — End: 2015-05-31

## 2015-03-04 NOTE — Telephone Encounter (Signed)
Requested Prescriptions   Signed Prescriptions Disp Refills  . isosorbide mononitrate (IMDUR) 30 MG 24 hr tablet 60 tablet 3    Sig: TAKE ONE (1) TABLET TWICE DAILY.    Authorizing Provider: Minna Merritts    Ordering User: Britt Bottom

## 2015-03-05 ENCOUNTER — Telehealth: Payer: Self-pay | Admitting: Cardiovascular Disease

## 2015-03-05 NOTE — Telephone Encounter (Signed)
Spoke w/ pt.  He wanted clarification on how to take his Ranexa. Advised him to take 500 mg BID x 1 week and then 1000 mg BID. He was provided at least 1 month of samples to him until he comes in for his next appt.   Asked him to call back w/ any questions or concerns.

## 2015-03-05 NOTE — Telephone Encounter (Signed)
Patient wants [place] to know he checked VS this morning bp: 125/73 and pulse: 54     Patient concerned that this is lower than normal as it is normally in 59 's .   Patient still has chest pain   Also wants to clarify medication for medication samples he picked up this morning.  Please call.

## 2015-03-15 ENCOUNTER — Telehealth: Payer: Self-pay

## 2015-03-15 NOTE — Telephone Encounter (Signed)
Pt wife had question regarding Ranexa. Please call.

## 2015-03-15 NOTE — Telephone Encounter (Signed)
Spoke to patient and he stated that the Ranexa is working well for him and that he has not had any chest pain since starting. He stated that his wife had some questions regarding the dosage and requested that I call her at home. Called her number and she just wanted to clarify that he was supposed to take Ranexa 500 mg twice daily for one week and then increase to Ranexa 1000 mg twice daily. I confirmed that this was correct and she denied any further questions at this time.

## 2015-03-17 DIAGNOSIS — R0789 Other chest pain: Secondary | ICD-10-CM | POA: Diagnosis not present

## 2015-03-25 ENCOUNTER — Telehealth: Payer: Self-pay | Admitting: Cardiovascular Disease

## 2015-03-25 DIAGNOSIS — Z951 Presence of aortocoronary bypass graft: Secondary | ICD-10-CM

## 2015-03-25 DIAGNOSIS — R0602 Shortness of breath: Secondary | ICD-10-CM

## 2015-03-25 DIAGNOSIS — R079 Chest pain, unspecified: Secondary | ICD-10-CM

## 2015-03-25 DIAGNOSIS — I5022 Chronic systolic (congestive) heart failure: Secondary | ICD-10-CM

## 2015-03-25 NOTE — Telephone Encounter (Signed)
Pt c/o of Chest Pain: STAT if CP now or developed within 24 hours  1. Are you having CP right now? Only when he is doing an activity   2. Are you experiencing any other symptoms (ex. SOB, nausea, vomiting, sweating)? Little sob  3. How long have you been experiencing CP? Since yesterday   4. Is your CP continuous or coming and going? Coming and going   5. Have you taken Nitroglycerin? Took nitro yesterday has not taken it today     ?

## 2015-03-25 NOTE — Telephone Encounter (Addendum)
Spoke w/ pt.  He reports CP & SOBOE while raking leaves this am. He has taken 1 nitro, is currently sitting down, CP & SOB are resolving, he does have some dizziness & nausea, is not sweating. He has taken all of his meds this am, including Imdur 30 mg.  Has Ranexa that helped last week, but does not seem to be doing much this week. Advised pt to call 911 or proceed to the nearest ED if he has someone to drive him, but he declines. He states that if his sx become unbearable, he will go to the fire dept down the road, but his sx are improving w/ rest. He asks that I make Dr. Rockey Situ aware and call him back w/ his recommendation, as his CP is chronic & he does not want to go back to the ED.  Please advise.  Thank you.

## 2015-03-25 NOTE — Telephone Encounter (Signed)
Spoke w/ pt.  He reports that he is still symptomatic, has not gotten up from his chair since our last conversation.  He has taken 2 nitro so far today. He reports that his BP has been running 120-140s/70s.  Advised him that Dr. Rockey Situ reviewed his message and recommends that pt be set up for cardiac rehab, as pt has chronic chest pain & anxiety. Advised him that if his BP continues to support it, he can take another Imdur. He is agreeable and states that he will not go to the ED unless his sx become emergent, which he does not feel they are at this time. Pt will await call from cardiac rehab to set up appt and will try to relax today. Asked him to call back if we can be of further assistance.

## 2015-03-26 ENCOUNTER — Telehealth: Payer: Self-pay | Admitting: Cardiovascular Disease

## 2015-03-26 NOTE — Telephone Encounter (Signed)
Spoke w/ pt.  Advised him of Dr. Donivan Scull recommendation.  He is agreeable and will try this over the weekend.  He is scheduled for upcoming GI test to "see if I have reflux". He was advised by [facility] cardiology to hold his Eliquis x 2 days prior. After some discussion, he is agreeable to proceeding w/ cardiac rehab.  Asked him to call back next week to let me know if increase in Imdur is helping.

## 2015-03-26 NOTE — Telephone Encounter (Signed)
Patient does not want to go to cardiac rehab program as he already walks 1 mile 3 days a week and takes bp and pulse before and after while at home. Patient is concerned that he is still having cp and sob.  Please call patient to discuss this and upcoming gi procedure .

## 2015-03-26 NOTE — Telephone Encounter (Signed)
Ranexa 1000 placed at the front desk for pick up.

## 2015-03-26 NOTE — Telephone Encounter (Signed)
Patient does not want to go to cardiac rehab as he takes

## 2015-03-26 NOTE — Telephone Encounter (Signed)
Can we check in with [name] today, make sure he is doing better Would recommend isosorbide 30 mill grams twice a day through the weekend  Phone call mentions a GI procedure Can we find out more details

## 2015-03-26 NOTE — Telephone Encounter (Signed)
Patient calling the office for samples of medication:   1.  What medication and dosage are you requesting samples for? Ranexa 1000 mg sr po daily   2.  Are you currently out of this medication? No, has a refill at pharmacy for pick up .

## 2015-03-29 ENCOUNTER — Ambulatory Visit (INDEPENDENT_AMBULATORY_CARE_PROVIDER_SITE_OTHER): Payer: Medicare HMO | Admitting: Cardiovascular Disease

## 2015-03-29 ENCOUNTER — Encounter: Payer: Self-pay | Admitting: Cardiovascular Disease

## 2015-03-29 VITALS — BP 118/60 | HR 76 | Ht 70.0 in | Wt 173.0 lb

## 2015-03-29 DIAGNOSIS — I480 Paroxysmal atrial fibrillation: Secondary | ICD-10-CM

## 2015-03-29 DIAGNOSIS — I255 Ischemic cardiomyopathy: Secondary | ICD-10-CM | POA: Diagnosis not present

## 2015-03-29 DIAGNOSIS — I200 Unstable angina: Secondary | ICD-10-CM

## 2015-03-29 DIAGNOSIS — R079 Chest pain, unspecified: Secondary | ICD-10-CM

## 2015-03-29 DIAGNOSIS — F419 Anxiety disorder, unspecified: Secondary | ICD-10-CM

## 2015-03-29 DIAGNOSIS — I2510 Atherosclerotic heart disease of native coronary artery without angina pectoris: Secondary | ICD-10-CM

## 2015-03-29 MED ORDER — NITROGLYCERIN 0.4 MG SL SUBL: 0.4000 mg | SUBLINGUAL_TABLET | SUBLINGUAL | Status: DC | PRN

## 2015-03-29 NOTE — Assessment & Plan Note (Signed)
In the past he has reported having anxiety. Recommended he talk with primary care concerning possibly starting SSRI. He was tearful at times through our visit today

## 2015-03-29 NOTE — Assessment & Plan Note (Signed)
He appears relatively euvolemic on today's visit, no changes to his medications

## 2015-03-29 NOTE — Patient Instructions (Signed)
Please increase the metoprolol up to 25 mg in the Am, 1/2 pill at night  Please take nitro as needed   Continue ranexa for 2 weeks then decide if medication is helping If not helping, ok to stop  If chest pain escalates, Please call to order a stress test, nuclear  Please call us if you have new issues that need to be addressed before your next appt.

## 2015-03-29 NOTE — Assessment & Plan Note (Signed)
Normal sinus rhythm on today's visit, we'll continue anticoagulation Recommended increased metoprolol up to 25 mill grams in the morning, continue metoprolol 12.5 mg in the evening

## 2015-03-29 NOTE — Telephone Encounter (Signed)
Spoke w/ pt.  He reports that his Imdur was increased to 2 whole pills BID at his last ov, but he did not remember that during our last conversation.  He reports chest pain throughout the weekend. GI procedure scheduled for April 3. Dr. Rockey Situ had a cancellation today, so pt added to be seen today @ 3:00.

## 2015-03-29 NOTE — Assessment & Plan Note (Signed)
Prior records reviewed including recent hospitalization [facility] hospital records reviewed and discussed with him in detail. Catheterization films have been requested for our review given his chronic symptoms   Total encounter time more than 25 minutes  Greater than 50% was spent in counseling and coordination of care with the patient

## 2015-03-29 NOTE — Progress Notes (Signed)
Patient ID: Scott [name], male    DOB: February 16, 1940, 75 y.o.   MRN: [id number]  HPI Comments: Scott [name] is a pleasant 75 year old gentleman with history of CAD, CABG 2 with LIMA to the LAD, vein graft to the OM June 2016 at [facility], chronic chest pain not relieved with bypass surgery, who presents for follow-up of his chronic angina pectoralis  Cardiac catheterization April 2014 Cardiac catheterization April 2015 with stent to the RCA Cardiac catheterization with stent placement March 2016, DES to the mid LAD Notes indicating bypass surgery June 2016 at [place] test in November 2016 for chronic chest pain showing no ischemia He had follow-up cardiac catheterization December 2016 at [facility] Through this time his chest pain has not improved, last cardiac catheterization by report with patent stents We have requested the CDs with images for our system  Recently in the hospital with chronic chest pain, cardiac enzymes negative, no workup done at that time given recent catheterization He reports that he is walking on a regular basis and in general does not have chest pain when he exerts himself  3 episodes in the past several weeks with chest pain, 1 while he was raking, one wall sitting down Does not have chest pain at nighttime, only the daytime Started on low-dose isosorbide mononitrate with slow titration up as an outpatient, now taking 30 mg twice a day As he continued to have chest pain he was started on ranexa 500 mg twice a day with titration up to 1000 mg twice a day He is unclear if either of these medications is helping his symptoms  He is not particularly interested in cardiac rehabilitation as he reports that he can walk on his own  EKG on today's visit shows normal sinus rhythm with rate 75 bpm, intraventricular conduction delay, 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 He was initially medically managed.   In April 2015, he underwent repeat catheterization secondary to ongoing chest pain, showing stable moderate to severe disease. He then underwent successful RCA stenting.   In March 2016, due to recurrent chest pain, he underwent successful stenting of the LAD.    he had recurrent chest pain again in June 2016 and was seen at [facility].  He underwent CABG 2 with a LIMA to the LAD and a vein graft to the obtuse marginal.   following CABG, he continued to have persistent/chronic low-level left-sided and midsternal chest pain.  He had repeat hospitalizations  with stress testing in November at [facility], which was negative,  catheterization at [facility] in December 2016, which apparently showed patent grafts.  Lab work in the hospital  showing total cholesterol 142, LDL 77, HDL 47  Echocardiogram 03/02/2014 showing normal LV function, essentially normal study  Echocardiogram 04/22/2012 showing ejection fraction 40%, no mention of wall motion abnormality, mild MR, TR     Allergies  Allergen Reactions  . Contrast Media  [Iodinated Diagnostic Agents] Hives  . Oxycodone Hcl Itching    Outpatient Encounter Prescriptions as of 03/29/2015  Medication Sig  . apixaban (ELIQUIS) 5 MG TABS tablet Take 5 mg by mouth 2 (two) times daily.  Marland Kitchen atorvastatin (LIPITOR) 80 MG tablet Take 80 mg by mouth at bedtime.  . isosorbide mononitrate (IMDUR) 30  MG 24 hr tablet TAKE ONE (1) TABLET TWICE DAILY.  . metoprolol tartrate (LOPRESSOR) 25 MG tablet Take 0.5 tablets (12.5 mg total) by mouth 2 (two) times daily.  . nitroGLYCERIN (NITROSTAT) 0.4 MG SL tablet Place 1 tablet (0.4 mg total) under the tongue every 5 (five) minutes as needed for chest pain.  Marland Kitchen omeprazole (PRILOSEC) 20 MG capsule Take 20 mg by mouth 2 (two) times daily.  . ranolazine (RANEXA)  1000 MG SR tablet Take 1 tablet (1,000 mg total) by mouth 2 (two) times daily.  . [DISCONTINUED] nitroGLYCERIN (NITROSTAT) 0.4 MG SL tablet Place 1 tablet (0.4 mg total) under the tongue every 5 (five) minutes as needed for chest pain.  . [DISCONTINUED] aspirin EC 81 MG tablet Take 1 tablet (81 mg total) by mouth daily. (Patient not taking: Reported on 03/29/2015)  . [DISCONTINUED] ranitidine (ZANTAC) 300 MG tablet Take 300 mg by mouth at bedtime. Reported on 03/29/2015  . [DISCONTINUED] zolpidem (AMBIEN) 5 MG tablet Take 5 mg by mouth at bedtime as needed for sleep. Reported on 03/29/2015   No facility-administered encounter medications on file as of 03/29/2015.    Past Medical History  Diagnosis Date  . Cardiomyopathy, ischemic     a. 04/2012 Echo: EF 40-45%;  b. 08/2013 Echo: EF 45-50%, mild glob HK, mod lat/post HK, diast dysfxn, mildly dil LA, mild MR, nl RVSP; c. 01/2015 Echo: EF 40-45%, Gr 1 DD, mildly dil LA.  Marland Kitchen Hyperlipidemia   . Carotid arterial disease ([place])     a. 05/2013 Carotid U/S; bilat 40-50% ICA stenosis.  Marland Kitchen CAD (coronary artery disease)     a. 04/2012 Cath: 3VD->Med Rx;  b. 04/2013 Cath/PCI: RCA 70p, 4m, 95d. Mid & distal RCA treated with 2 DES; c. 03/2014 PCI: LAD 80 (3.0x23 Xience Alpine DES), 30 ISR in RCA; d. 06/2014 CABG x 2 ([facility]) LIMA->LAD, VG->OM; e. 11/2014 Neg MV; f. 12/2014 Cath ([facility]): patent grafts->Med Rx.  . Chronic Chest Pain   . Chronic systolic CHF (congestive heart failure) ([place])     a. 01/2015 Echo: EF 40-45%.    Past Surgical History  Procedure Laterality Date  . Right shoulder    . Carpal tunnel release      right hand  . Tonsillectomy    . Cataract extraction w/phaco Left 09/16/2014    Procedure: CATARACT EXTRACTION PHACO AND INTRAOCULAR LENS PLACEMENT (IOC);  Surgeon: Leandrew Koyanagi, MD;  Location: [place];  Service: Ophthalmology;  Laterality: Left;  . Coronary artery bypass graft  06-26-14  . Cardiac catheterization  05/01/2012  . Cardiac  catheterization  04/2013    [facility];x3 stent  . Coronary angioplasty with stent placement  04/13/2014  . Cardiac catheterization  01/11/15     [facility]    Social History  reports that he has quit smoking. His smoking use included Cigarettes. He started smoking about 5 years ago. He has a 35 pack-year smoking history. He has never used smokeless tobacco. He reports that he does not drink alcohol or use illicit drugs.  Family History family history includes Cancer in his maternal uncle; Heart attack (age of onset: 71) in his father.  Review of Systems  Constitutional: Negative.   Respiratory: Negative.   Cardiovascular: Negative.   Gastrointestinal: Negative.   Musculoskeletal: Negative.   Neurological: Negative.   Hematological: Negative.   All other systems reviewed and are negative.   BP 118/60 mmHg  Pulse 76  Ht 5\' 10"  (1.778 m)  Wt 173 lb (78.472 kg)  BMI 24.82 kg/m2  Physical Exam  Constitutional: He is oriented to person, place, and time. He appears well-developed and well-nourished.  HENT:  Head: Normocephalic.  Nose: Nose normal.  Mouth/Throat: Oropharynx is clear and moist.  Eyes: Conjunctivae are normal. Pupils are equal, round, and reactive to light.  Neck: Normal range of motion. Neck supple. No JVD present.  Cardiovascular: Normal rate, regular rhythm, S1 normal, S2 normal and intact distal pulses.  Exam reveals no gallop and no friction rub.   Murmur heard.  Systolic murmur is present with a grade of 2/6  Pulmonary/Chest: Effort normal and breath sounds normal. No respiratory distress. He has no wheezes. He has no rales. He exhibits no tenderness.  Abdominal: Soft. Bowel sounds are normal. He exhibits no distension. There is no tenderness.  Musculoskeletal: Normal range of motion. He exhibits no edema or tenderness.  Lymphadenopathy:    He has no cervical adenopathy.  Neurological: He is alert and oriented to person, place, and time. Coordination normal.  Skin: Skin  is warm and dry. No rash noted. No erythema.  Psychiatric: He has a normal mood and affect. His behavior is normal. Judgment and thought content normal.      Assessment and Plan   Nursing note and vitals reviewed.

## 2015-03-29 NOTE — Assessment & Plan Note (Signed)
Despite revascularization at [facility] and at [facility] he continues to have angina We have escalated his isosorbide, started ranexa though he continues to have periodic chest pain symptoms. Unclear why he is waiting several hours at a time before taking sublingual nitroglycerin. We have recommended he take nitroglycerin as soon as possible if he develops chest pain. We have requested catheterization imaging from [facility] for our system. He'll continue his exercise program, is not interested in cardiac rehabilitation We have expressed to him that there is no point and further stress testing or catheterization at this time as despite revascularization and recent stress testing and catheterization, he is continuing to have symptoms. He most likely has small vessel disease not amenable to intervention. I agree with the EGD which is next month to rule out GI disease

## 2015-04-02 ENCOUNTER — Telehealth: Payer: Self-pay | Admitting: *Deleted

## 2015-04-02 ENCOUNTER — Telehealth: Payer: Self-pay | Admitting: Cardiovascular Disease

## 2015-04-02 MED ORDER — TRAMADOL HCL 50 MG PO TABS: 50.0000 mg | ORAL_TABLET | ORAL | Status: DC | PRN

## 2015-04-02 NOTE — Telephone Encounter (Signed)
Patient reports having some chest pain on the right side of his chest. He states that the nitroglycerin did not help any. Instructed him to try taking something for reflux to see if that will help and call me back. He verbalized understanding of instructions and will follow up with him to see how he is feeling. He denied any shortness of breath, jaw pain, or any other symptoms.

## 2015-04-02 NOTE — Telephone Encounter (Signed)
Dr. Rockey Situ ordered ultram 50 mg every 4 hours as needed for chest discomfort. Called and left message on patients voicemail to call back.

## 2015-04-02 NOTE — Telephone Encounter (Signed)
Spoke with patient. See other note

## 2015-04-02 NOTE — Telephone Encounter (Signed)
Spoke with patient regarding pain medication prescription that we have for him. He is going to come pick it up to try over the weekend to see if that helps. He had no further questions at this time.

## 2015-04-02 NOTE — Telephone Encounter (Signed)
Pt c/o of Chest Pain: STAT if CP now or developed within 24 hours  1. Are you having CP right now? yes  2. Are you experiencing any other symptoms (ex. SOB, nausea, vomiting, sweating)?   3. How long have you been experiencing CP? Since last night   4. Is your CP continuous or coming and going?   5. Have you taken Nitroglycerin? Take about 3 already  ?

## 2015-04-02 NOTE — Telephone Encounter (Signed)
Pt calling stating he HR is 53 and is very dizzy  Please advise

## 2015-04-02 NOTE — Telephone Encounter (Signed)
Called and spoke with patient and he reported that he heart rate was now back to 60. He stated that the ibuprofen that he had taken earlier did help some with his chest and he was able to get a nap since he did not rest well the night before. He called in because he was concerned his heart rate was 53 earlier. Let him know that heart rates can fluctuate and it really depends on how he is feeling. He states that he is just tired right now. He had no further questions at this time and told him to call back if he had any further problems. He verbalized understanding of all instructions.

## 2015-04-02 NOTE — Telephone Encounter (Signed)
Patient reports that he took some maalox and that it feels a little better. He stated that last night when he would touch his chest that he had severe pains. Let him know that touching his chest would be more of like a muscle and not his heart. He agreed and felt it was more soreness but when he touched the chest area it was much worse. Instructed him to try taking some ibuprofen to see if that helps. He wanted to know what could cause that and I told him that sometimes if you turn the wrong way or lift something it can cause some muscle strain. Instructed him that if he should have any jaw pain, or constant chest pain with no relief then he may need to go to the ER but to try the ibuprofen, maalox, and other medications to see if that helps in anyway. Instructed him to please call back if he has any further questions or needs any additional guidance.

## 2015-04-19 ENCOUNTER — Telehealth: Payer: Self-pay | Admitting: Cardiovascular Disease

## 2015-04-19 DIAGNOSIS — R079 Chest pain, unspecified: Secondary | ICD-10-CM

## 2015-04-19 NOTE — Telephone Encounter (Signed)
Spoke w/ pt.  He reports that he only took the tramadol once and it upset his stomach. He does not know if it helped his pain or not.   Suggested referral to the pain clinic, but he states that he only has sx when he is active, so he is unsure if this well help. He asks that I make Dr. Rockey Situ aware and call back w/ any other suggestions.

## 2015-04-19 NOTE — Telephone Encounter (Signed)
Pt states he had "severe chest pain" Starting Saturday,   Pt c/o of Chest Pain: STAT if CP now or developed within 24 hours  1. Are you having CP right now? Just sore in center of his chest  2. Are you experiencing any other symptoms (ex. SOB, nausea, vomiting, sweating)? SOb  3. How long have you been experiencing CP? Since Saturday, yesterday when he went walking, it got worse  4. Is your CP continuous or coming and going? continual  5. Have you taken Nitroglycerin? Took 1 yesterday, and received relief ?

## 2015-04-19 NOTE — Telephone Encounter (Signed)
Need pain clinic referral, Would try tramadol we sent in previously

## 2015-04-20 ENCOUNTER — Ambulatory Visit: Payer: Medicare HMO | Admitting: Cardiovascular Disease

## 2015-04-20 DIAGNOSIS — Z5181 Encounter for therapeutic drug level monitoring: Secondary | ICD-10-CM | POA: Diagnosis not present

## 2015-04-20 DIAGNOSIS — R072 Precordial pain: Secondary | ICD-10-CM | POA: Diagnosis not present

## 2015-04-20 DIAGNOSIS — R9431 Abnormal electrocardiogram [ECG] [EKG]: Secondary | ICD-10-CM | POA: Diagnosis not present

## 2015-04-20 DIAGNOSIS — E785 Hyperlipidemia, unspecified: Secondary | ICD-10-CM | POA: Diagnosis not present

## 2015-04-20 DIAGNOSIS — R0600 Dyspnea, unspecified: Secondary | ICD-10-CM | POA: Diagnosis not present

## 2015-04-20 DIAGNOSIS — I10 Essential (primary) hypertension: Secondary | ICD-10-CM | POA: Diagnosis not present

## 2015-04-20 DIAGNOSIS — R61 Generalized hyperhidrosis: Secondary | ICD-10-CM | POA: Diagnosis not present

## 2015-04-20 DIAGNOSIS — I480 Paroxysmal atrial fibrillation: Secondary | ICD-10-CM | POA: Diagnosis not present

## 2015-04-20 DIAGNOSIS — Z79899 Other long term (current) drug therapy: Secondary | ICD-10-CM | POA: Diagnosis not present

## 2015-04-20 DIAGNOSIS — Z87891 Personal history of nicotine dependence: Secondary | ICD-10-CM | POA: Diagnosis not present

## 2015-04-20 DIAGNOSIS — R079 Chest pain, unspecified: Secondary | ICD-10-CM | POA: Diagnosis not present

## 2015-04-20 DIAGNOSIS — I2510 Atherosclerotic heart disease of native coronary artery without angina pectoris: Secondary | ICD-10-CM | POA: Diagnosis not present

## 2015-04-20 DIAGNOSIS — R0789 Other chest pain: Secondary | ICD-10-CM | POA: Diagnosis not present

## 2015-04-20 DIAGNOSIS — I517 Cardiomegaly: Secondary | ICD-10-CM | POA: Diagnosis not present

## 2015-04-20 DIAGNOSIS — R9439 Abnormal result of other cardiovascular function study: Secondary | ICD-10-CM | POA: Diagnosis not present

## 2015-04-20 DIAGNOSIS — I4589 Other specified conduction disorders: Secondary | ICD-10-CM | POA: Diagnosis not present

## 2015-04-20 NOTE — Telephone Encounter (Signed)
Spoke w/ pt.  Advised him to try the tramadol again, but make sure he has had something to eat and maybe it won't upset his stomach. Advised him that I am placing a referral to the pain clinic.   He voices some concern, as he doesn't want to take a pill that will mask his pain.  Discussed w/ him that pain is important in bringing attention to an area, while it can also interfere w/ his day to day life. He is agreeable to at least finding out what the pain clinic has to offer. Advised him that he will receive a call from their office to set up an appt.

## 2015-04-21 DIAGNOSIS — I480 Paroxysmal atrial fibrillation: Secondary | ICD-10-CM | POA: Diagnosis not present

## 2015-04-21 DIAGNOSIS — R9439 Abnormal result of other cardiovascular function study: Secondary | ICD-10-CM | POA: Diagnosis not present

## 2015-04-21 DIAGNOSIS — R0600 Dyspnea, unspecified: Secondary | ICD-10-CM | POA: Diagnosis not present

## 2015-04-21 DIAGNOSIS — E785 Hyperlipidemia, unspecified: Secondary | ICD-10-CM | POA: Diagnosis not present

## 2015-04-21 DIAGNOSIS — I10 Essential (primary) hypertension: Secondary | ICD-10-CM | POA: Diagnosis not present

## 2015-04-21 DIAGNOSIS — R079 Chest pain, unspecified: Secondary | ICD-10-CM | POA: Diagnosis not present

## 2015-04-23 ENCOUNTER — Encounter: Payer: Self-pay | Admitting: *Deleted

## 2015-04-26 ENCOUNTER — Ambulatory Visit: Payer: Medicare HMO | Admitting: Certified Registered"

## 2015-04-26 ENCOUNTER — Encounter: Payer: Self-pay | Admitting: *Deleted

## 2015-04-26 ENCOUNTER — Ambulatory Visit
Admission: RE | Admit: 2015-04-26 | Discharge: 2015-04-26 | Disposition: A | Discharge status: Home / Self Care | Payer: Medicare HMO | Source: Ambulatory Visit | Attending: Gastroenterology | Admitting: Gastroenterology

## 2015-04-26 ENCOUNTER — Encounter
Admission: RE | Disposition: A | Discharge status: Home / Self Care | Payer: Self-pay | Source: Ambulatory Visit | Attending: Gastroenterology

## 2015-04-26 DIAGNOSIS — Z87891 Personal history of nicotine dependence: Secondary | ICD-10-CM | POA: Insufficient documentation

## 2015-04-26 DIAGNOSIS — K210 Gastro-esophageal reflux disease with esophagitis: Secondary | ICD-10-CM | POA: Diagnosis not present

## 2015-04-26 DIAGNOSIS — Z951 Presence of aortocoronary bypass graft: Secondary | ICD-10-CM | POA: Diagnosis not present

## 2015-04-26 DIAGNOSIS — Z955 Presence of coronary angioplasty implant and graft: Secondary | ICD-10-CM | POA: Insufficient documentation

## 2015-04-26 DIAGNOSIS — I255 Ischemic cardiomyopathy: Secondary | ICD-10-CM | POA: Diagnosis not present

## 2015-04-26 DIAGNOSIS — I739 Peripheral vascular disease, unspecified: Secondary | ICD-10-CM | POA: Diagnosis not present

## 2015-04-26 DIAGNOSIS — I252 Old myocardial infarction: Secondary | ICD-10-CM | POA: Insufficient documentation

## 2015-04-26 DIAGNOSIS — I25810 Atherosclerosis of coronary artery bypass graft(s) without angina pectoris: Secondary | ICD-10-CM | POA: Diagnosis not present

## 2015-04-26 DIAGNOSIS — E785 Hyperlipidemia, unspecified: Secondary | ICD-10-CM | POA: Diagnosis not present

## 2015-04-26 DIAGNOSIS — Z885 Allergy status to narcotic agent status: Secondary | ICD-10-CM | POA: Diagnosis not present

## 2015-04-26 DIAGNOSIS — R079 Chest pain, unspecified: Secondary | ICD-10-CM | POA: Diagnosis present

## 2015-04-26 DIAGNOSIS — I2510 Atherosclerotic heart disease of native coronary artery without angina pectoris: Secondary | ICD-10-CM | POA: Diagnosis not present

## 2015-04-26 DIAGNOSIS — Z79899 Other long term (current) drug therapy: Secondary | ICD-10-CM | POA: Diagnosis not present

## 2015-04-26 DIAGNOSIS — R0789 Other chest pain: Secondary | ICD-10-CM | POA: Insufficient documentation

## 2015-04-26 DIAGNOSIS — Z91041 Radiographic dye allergy status: Secondary | ICD-10-CM | POA: Diagnosis not present

## 2015-04-26 DIAGNOSIS — I10 Essential (primary) hypertension: Secondary | ICD-10-CM | POA: Diagnosis not present

## 2015-04-26 DIAGNOSIS — I5022 Chronic systolic (congestive) heart failure: Secondary | ICD-10-CM | POA: Diagnosis not present

## 2015-04-26 DIAGNOSIS — I509 Heart failure, unspecified: Secondary | ICD-10-CM | POA: Diagnosis not present

## 2015-04-26 DIAGNOSIS — Z7901 Long term (current) use of anticoagulants: Secondary | ICD-10-CM | POA: Insufficient documentation

## 2015-04-26 HISTORY — PX: ESOPHAGOGASTRODUODENOSCOPY (EGD) WITH PROPOFOL: SHX5813

## 2015-04-26 SURGERY — ESOPHAGOGASTRODUODENOSCOPY (EGD) WITH PROPOFOL
Anesthesia: General

## 2015-04-26 MED ORDER — SODIUM CHLORIDE 0.9 % IV SOLN
INTRAVENOUS | Status: DC
  Administered 2015-04-26: 08:00:00 via INTRAVENOUS

## 2015-04-26 MED ORDER — PROPOFOL 500 MG/50ML IV EMUL
INTRAVENOUS | Status: DC | PRN
  Administered 2015-04-26: 125 ug/kg/min via INTRAVENOUS

## 2015-04-26 MED ORDER — PROPOFOL 10 MG/ML IV BOLUS
INTRAVENOUS | Status: DC | PRN
  Administered 2015-04-26: 70 mg via INTRAVENOUS

## 2015-04-26 MED ORDER — LIDOCAINE HCL (PF) 2 % IJ SOLN
INTRAMUSCULAR | Status: DC | PRN
  Administered 2015-04-26: 60 mg via INTRADERMAL

## 2015-04-26 MED ORDER — SODIUM CHLORIDE 0.9 % IV SOLN: INTRAVENOUS | Status: DC

## 2015-04-26 NOTE — Anesthesia Postprocedure Evaluation (Signed)
Anesthesia Post Note  Patient: Scott [name]  Procedure(s) Performed: Procedure(s) (LRB): ESOPHAGOGASTRODUODENOSCOPY (EGD) WITH PROPOFOL (N/A)  Patient location during evaluation: PACU Anesthesia Type: General Level of consciousness: sedated and responds to stimulation Vital Signs Assessment: post-procedure vital signs reviewed and stable Respiratory status: spontaneous breathing, nonlabored ventilation and patient connected to nasal cannula oxygen Cardiovascular status: stable Anesthetic complications: no    Last Vitals:  Filed Vitals:   04/26/15 0733 04/26/15 0814  BP: 131/91   Pulse: 55 55  Temp: 35.9 C   Resp: 18 6    Last Pain: There were no vitals filed for this visit.               [employer]

## 2015-04-26 NOTE — H&P (Signed)
Primary Care Physician:  Madelyn Brunner, MD Primary Gastroenterologist:  Dr. Candace Cruise  Pre-Procedure History & Physical: HPI:  Scott [name] is a 75 y.o. male is here for an EGD.  Past Medical History  Diagnosis Date  . Cardiomyopathy, ischemic     a. 04/2012 Echo: EF 40-45%;  b. 08/2013 Echo: EF 45-50%, mild glob HK, mod lat/post HK, diast dysfxn, mildly dil LA, mild MR, nl RVSP; c. 01/2015 Echo: EF 40-45%, Gr 1 DD, mildly dil LA.  Marland Kitchen Hyperlipidemia   . Carotid arterial disease ([place])     a. 05/2013 Carotid U/S; bilat 40-50% ICA stenosis.  Marland Kitchen CAD (coronary artery disease)     a. 04/2012 Cath: 3VD->Med Rx;  b. 04/2013 Cath/PCI: RCA 70p, 25m, 95d. Mid & distal RCA treated with 2 DES; c. 03/2014 PCI: LAD 80 (3.0x23 Xience Alpine DES), 30 ISR in RCA; d. 06/2014 CABG x 2 ([facility]) LIMA->LAD, VG->OM; e. 11/2014 Neg MV; f. 12/2014 Cath ([facility]): patent grafts->Med Rx.  . Chronic Chest Pain   . Chronic systolic CHF (congestive heart failure) ([place])     a. 01/2015 Echo: EF 40-45%.    Past Surgical History  Procedure Laterality Date  . Right shoulder    . Carpal tunnel release      right hand  . Tonsillectomy    . Cataract extraction w/phaco Left 09/16/2014    Procedure: CATARACT EXTRACTION PHACO AND INTRAOCULAR LENS PLACEMENT (IOC);  Surgeon: Leandrew Koyanagi, MD;  Location: [place];  Service: Ophthalmology;  Laterality: Left;  . Coronary artery bypass graft  06-26-14  . Cardiac catheterization  05/01/2012  . Cardiac catheterization  04/2013    [facility];x3 stent  . Coronary angioplasty with stent placement  04/13/2014  . Cardiac catheterization  01/11/15     [facility]  . De quervain's release Left 08/22/2012  . Colonoscopy      Prior to Admission medications   Medication Sig Start Date End Date Taking? Authorizing Provider  atorvastatin (LIPITOR) 80 MG tablet Take 80 mg by mouth at bedtime.   Yes Historical Provider, MD  FERROUS SULFATE DRIED PO Take by mouth.   Yes Historical Provider, MD   isosorbide mononitrate (IMDUR) 30 MG 24 hr tablet TAKE ONE (1) TABLET TWICE DAILY. 03/04/15  Yes Minna Merritts, MD  metoprolol tartrate (LOPRESSOR) 25 MG tablet Take 0.5 tablets (12.5 mg total) by mouth 2 (two) times daily. 02/09/15  Yes Demetrios Loll, MD  omeprazole (PRILOSEC) 20 MG capsule Take 20 mg by mouth 2 (two) times daily.   Yes Historical Provider, MD  pantoprazole (PROTONIX) 40 MG tablet Take 40 mg by mouth daily.   Yes Historical Provider, MD  ranitidine (ZANTAC) 300 MG tablet Take 300 mg by mouth at bedtime.   Yes Historical Provider, MD  zolpidem (AMBIEN) 5 MG tablet Take 5 mg by mouth at bedtime as needed for sleep.   Yes Historical Provider, MD  apixaban (ELIQUIS) 5 MG TABS tablet Take 5 mg by mouth 2 (two) times daily.    Historical Provider, MD  nitroGLYCERIN (NITROSTAT) 0.4 MG SL tablet Place 1 tablet (0.4 mg total) under the tongue every 5 (five) minutes as needed for chest pain. 03/29/15   Minna Merritts, MD  ranolazine (RANEXA) 1000 MG SR tablet Take 1 tablet (1,000 mg total) by mouth 2 (two) times daily. 03/02/15   Minna Merritts, MD  traMADol (ULTRAM) 50 MG tablet Take 1 tablet (50 mg total) by mouth every 4 (four) hours  as needed for moderate pain. 04/02/15   Minna Merritts, MD    Allergies as of 03/18/2015 - Review Complete 02/19/2015  Allergen Reaction Noted  . Contrast media  [iodinated diagnostic agents] Hives 06/08/2014  . Oxycodone hcl Itching 09/09/2014    Family History  Problem Relation Age of Onset  . Heart attack Father 85    MI  . Cancer Maternal Uncle     Social History   Social History  . Marital Status: Married    Spouse Name: N/A  . Number of Children: N/A  . Years of Education: N/A   Occupational History  . Not on file.   Social History Main Topics  . Smoking status: Former Smoker -- 1.00 packs/day for 35 years    Types: Cigarettes    Start date: 05/02/2009  . Smokeless tobacco: Never Used  . Alcohol Use: No  . Drug Use: No  . Sexual  Activity: Yes   Other Topics Concern  . Not on file   Social History Narrative    Review of Systems: See HPI, otherwise negative ROS  Physical Exam: BP 131/91 mmHg  Pulse 55  Temp(Src) 96.6 F (35.9 C) (Tympanic)  Resp 18  Ht 5\' 10"  (1.778 m)  Wt 78.472 kg (173 lb)  BMI 24.82 kg/m2  SpO2 100% General:   Alert,  pleasant and cooperative in NAD Head:  Normocephalic and atraumatic. Neck:  Supple; no masses or thyromegaly. Lungs:  Clear throughout to auscultation.    Heart:  Regular rate and rhythm. Abdomen:  Soft, nontender and nondistended. Normal bowel sounds, without guarding, and without rebound.   Neurologic:  Alert and  oriented x4;  grossly normal neurologically.  Impression/Plan: Scott [name] is here for an EGD to be performed for atypical chest pain.  Risks, benefits, limitations, and alternatives regarding EGD have been reviewed with the patient.  Questions have been answered.  All parties agreeable.   Evetta Renner, Lupita Dawn, MD  04/26/2015, 8:00 AM

## 2015-04-26 NOTE — Anesthesia Preprocedure Evaluation (Signed)
Anesthesia Evaluation  Patient identified by MRN, date of birth, ID band Patient awake    Reviewed: Allergy & Precautions, H&P , NPO status , Patient's Chart, lab work & pertinent test results, reviewed documented beta blocker date and time   History of Anesthesia Complications Negative for: history of anesthetic complications  Airway Mallampati: III  TM Distance: >3 FB Neck ROM: full    Dental no notable dental hx. (+) Teeth Intact   Pulmonary shortness of breath and with exertion, neg sleep apnea, neg COPD, neg recent URI, former smoker,    Pulmonary exam normal breath sounds clear to auscultation       Cardiovascular Exercise Tolerance: Good hypertension, + angina with exertion + CAD, + Past MI, + Cardiac Stents (4 stents), + CABG, + Peripheral Vascular Disease and +CHF  Normal cardiovascular exam(-) dysrhythmias (-) Valvular Problems/Murmurs Rhythm:regular Rate:Normal     Neuro/Psych negative neurological ROS  negative psych ROS   GI/Hepatic negative GI ROS, Neg liver ROS,   Endo/Other  negative endocrine ROS  Renal/GU negative Renal ROS  negative genitourinary   Musculoskeletal   Abdominal   Peds  Hematology negative hematology ROS (+)   Anesthesia Other Findings Past Medical History:   Cardiomyopathy, ischemic                                       Comment:a. 04/2012 Echo: EF 40-45%;  b. 08/2013 Echo: EF               45-50%, mild glob HK, mod lat/post HK, diast               dysfxn, mildly dil LA, mild MR, nl RVSP; c.               01/2015 Echo: EF 40-45%, Gr 1 DD, mildly dil LA.   Hyperlipidemia                                               Carotid arterial disease ([place])                                 Comment:a. 05/2013 Carotid U/S; bilat 40-50% ICA               stenosis.   CAD (coronary artery disease)                                  Comment:a. 04/2012 Cath: 3VD->Med Rx;  b. 04/2013               Cath/PCI:  RCA 70p, 31m, 95d. Mid & distal RCA               treated with 2 DES; c. 03/2014 PCI: LAD 80               (3.0x23 Xience Alpine DES), 30 ISR in RCA; d.               06/2014 CABG x 2 ([facility]) LIMA->LAD, VG->OM; e.               11/2014 Neg MV; f. 12/2014 Cath ([facility]): patent  grafts->Med Rx.   Chronic Chest Pain                                           Chronic systolic CHF (congestive heart failure*                Comment:a. 01/2015 Echo: EF 40-45%.   Reproductive/Obstetrics negative OB ROS                             Anesthesia Physical Anesthesia Plan  ASA: III  Anesthesia Plan: General   Post-op Pain Management:    Induction:   Airway Management Planned:   Additional Equipment:   Intra-op Plan:   Post-operative Plan:   Informed Consent: I have reviewed the patients History and Physical, chart, labs and discussed the procedure including the risks, benefits and alternatives for the proposed anesthesia with the patient or authorized representative who has indicated his/her understanding and acceptance.   Dental Advisory Given  Plan Discussed with: Anesthesiologist, CRNA and Surgeon  Anesthesia Plan Comments:         Anesthesia Quick Evaluation

## 2015-04-26 NOTE — Transfer of Care (Signed)
Immediate Anesthesia Transfer of Care Note  Patient: Scott [name]  Procedure(s) Performed: Procedure(s): ESOPHAGOGASTRODUODENOSCOPY (EGD) WITH PROPOFOL (N/A)  Patient Location: PACU  Anesthesia Type:General  Level of Consciousness: sedated and responds to stimulation  Airway & Oxygen Therapy: Patient Spontanous Breathing and Patient connected to nasal cannula oxygen  Post-op Assessment: Report given to RN and Post -op Vital signs reviewed and stable  Post vital signs: Reviewed and stable  Last Vitals:  Filed Vitals:   04/26/15 0733 04/26/15 0814  BP: 131/91   Pulse: 55 55  Temp: 35.9 C   Resp: 18 6    Complications: No apparent anesthesia complications

## 2015-04-26 NOTE — Op Note (Signed)
[facility] Patient Name: Scott [name] Procedure Date: 04/26/2015 7:59 AM MRN: [id number] Account #: [id number] Date of Birth: 1940-06-19 Admit Type: Outpatient Age: 75 Room: [facility] ENDO ROOM 4 Gender: Male Note Status: Finalized Procedure:            Upper GI endoscopy Indications:          Unexplained chest pain Providers:            Lupita Dawn. Candace Cruise, MD Referring MD:         Hewitt Blade. Sarina Ser, MD (Referring MD) Medicines:            Monitored Anesthesia Care Complications:        No immediate complications. Procedure:            Pre-Anesthesia Assessment:                       - Prior to the procedure, a History and Physical was                        performed, and patient medications, allergies and                        sensitivities were reviewed. The patient's tolerance of                        previous anesthesia was reviewed.                       - The risks and benefits of the procedure and the                        sedation options and risks were discussed with the                        patient. All questions were answered and informed                        consent was obtained.                       - After reviewing the risks and benefits, the patient                        was deemed in satisfactory condition to undergo the                        procedure.                       After obtaining informed consent, the endoscope was                        passed under direct vision. Throughout the procedure,                        the patient's blood pressure, pulse, and oxygen                        saturations were monitored continuously. The Endoscope  was introduced through the mouth, and advanced to the                        second part of duodenum. The upper GI endoscopy was                        accomplished without difficulty. The patient tolerated                        the procedure well. Findings:  The examined esophagus was normal. Biopsies were taken with a cold       forceps for histology.      The entire examined stomach was normal.      The examined duodenum was normal. Impression:           - Normal esophagus. Biopsied.                       - Normal stomach.                       - Normal examined duodenum. Recommendation:       - Discharge patient to home.                       - Observe patient's clinical course.                       - Await pathology results.                       - The findings and recommendations were discussed with                        the patient.                       - Resume eliquis in 2 days Procedure Code(s):    --- Professional ---                       [phone], Esophagogastroduodenoscopy, flexible, transoral;                        with biopsy, single or multiple Diagnosis Code(s):    --- Professional ---                       R07.9, Chest pain, unspecified CPT copyright 2016 American Medical Association. All rights reserved. The codes documented in this report are preliminary and upon coder review may  be revised to meet current compliance requirements. Hulen Luster, MD 04/26/2015 8:10:53 AM This report has been signed electronically. Number of Addenda: 0 Note Initiated On: 04/26/2015 7:59 AM      [facility]

## 2015-04-27 ENCOUNTER — Encounter: Payer: Self-pay | Admitting: Gastroenterology

## 2015-04-27 LAB — SURGICAL PATHOLOGY

## 2015-04-29 ENCOUNTER — Other Ambulatory Visit: Payer: Self-pay | Admitting: Cardiovascular Disease

## 2015-04-29 DIAGNOSIS — H353131 Nonexudative age-related macular degeneration, bilateral, early dry stage: Secondary | ICD-10-CM | POA: Diagnosis not present

## 2015-04-30 DIAGNOSIS — E782 Mixed hyperlipidemia: Secondary | ICD-10-CM | POA: Diagnosis not present

## 2015-04-30 DIAGNOSIS — I5032 Chronic diastolic (congestive) heart failure: Secondary | ICD-10-CM | POA: Diagnosis not present

## 2015-04-30 DIAGNOSIS — I2510 Atherosclerotic heart disease of native coronary artery without angina pectoris: Secondary | ICD-10-CM | POA: Diagnosis not present

## 2015-04-30 DIAGNOSIS — I10 Essential (primary) hypertension: Secondary | ICD-10-CM | POA: Diagnosis not present

## 2015-04-30 DIAGNOSIS — I480 Paroxysmal atrial fibrillation: Secondary | ICD-10-CM | POA: Diagnosis not present

## 2015-05-18 ENCOUNTER — Telehealth: Payer: Self-pay

## 2015-05-18 NOTE — Telephone Encounter (Signed)
Received notification from [facility] cardiac rehab that pt has no desire to participate in their program at this time, as he has been in cardiac rehab twice and is currently walking each day.

## 2015-05-31 ENCOUNTER — Emergency Department: Payer: Medicare HMO

## 2015-05-31 ENCOUNTER — Encounter: Payer: Self-pay | Admitting: Emergency Medicine

## 2015-05-31 ENCOUNTER — Telehealth: Payer: Self-pay | Admitting: Cardiovascular Disease

## 2015-05-31 ENCOUNTER — Emergency Department
Admission: EM | Admit: 2015-05-31 | Discharge: 2015-05-31 | Disposition: A | Discharge status: Home / Self Care | Payer: Medicare HMO | Attending: Emergency Medicine | Admitting: Emergency Medicine

## 2015-05-31 DIAGNOSIS — Z87891 Personal history of nicotine dependence: Secondary | ICD-10-CM | POA: Diagnosis not present

## 2015-05-31 DIAGNOSIS — Z79899 Other long term (current) drug therapy: Secondary | ICD-10-CM | POA: Diagnosis not present

## 2015-05-31 DIAGNOSIS — I110 Hypertensive heart disease with heart failure: Secondary | ICD-10-CM | POA: Insufficient documentation

## 2015-05-31 DIAGNOSIS — E785 Hyperlipidemia, unspecified: Secondary | ICD-10-CM | POA: Diagnosis not present

## 2015-05-31 DIAGNOSIS — Z951 Presence of aortocoronary bypass graft: Secondary | ICD-10-CM | POA: Insufficient documentation

## 2015-05-31 DIAGNOSIS — I5042 Chronic combined systolic (congestive) and diastolic (congestive) heart failure: Secondary | ICD-10-CM | POA: Diagnosis not present

## 2015-05-31 DIAGNOSIS — R0600 Dyspnea, unspecified: Secondary | ICD-10-CM | POA: Diagnosis not present

## 2015-05-31 DIAGNOSIS — I25119 Atherosclerotic heart disease of native coronary artery with unspecified angina pectoris: Secondary | ICD-10-CM | POA: Diagnosis not present

## 2015-05-31 DIAGNOSIS — R0602 Shortness of breath: Secondary | ICD-10-CM | POA: Diagnosis not present

## 2015-05-31 DIAGNOSIS — Z7901 Long term (current) use of anticoagulants: Secondary | ICD-10-CM | POA: Diagnosis not present

## 2015-05-31 DIAGNOSIS — R079 Chest pain, unspecified: Secondary | ICD-10-CM | POA: Diagnosis not present

## 2015-05-31 DIAGNOSIS — I255 Ischemic cardiomyopathy: Secondary | ICD-10-CM | POA: Diagnosis not present

## 2015-05-31 LAB — TROPONIN I
Troponin I: <0.03 ng/mL (ref <0.031)
Troponin I: <0.03 ng/mL (ref <0.031)

## 2015-05-31 LAB — BASIC METABOLIC PANEL
Anion gap: 11 (ref 5–15)
BUN: 22 mg/dL — AB (ref 6–20)
CALCIUM: 8.9 mg/dL (ref 8.9–10.3)
CHLORIDE: 106 mmol/L (ref 101–111)
CO2: 22 mmol/L (ref 22–32)
CREATININE: 1.13 mg/dL (ref 0.61–1.24)
GFR calc non Af Amer: >60 mL/min (ref >60)
GLUCOSE: 115 mg/dL — AB (ref 65–99)
Potassium: 4.3 mmol/L (ref 3.5–5.1)
Sodium: 139 mmol/L (ref 135–145)

## 2015-05-31 LAB — FIBRIN DERIVATIVES D-DIMER (ARMC ONLY): Fibrin derivatives D-dimer (ARMC): 781 — ABNORMAL HIGH (ref 0–499)

## 2015-05-31 LAB — CBC
HCT: 45.8 % (ref 40.0–52.0)
Hemoglobin: 15.7 g/dL (ref 13.0–18.0)
MCH: 31.1 pg (ref 26.0–34.0)
MCHC: 34.3 g/dL (ref 32.0–36.0)
MCV: 90.6 fL (ref 80.0–100.0)
PLATELETS: 186 10*3/uL (ref 150–440)
RBC: 5.05 MIL/uL (ref 4.40–5.90)
RDW: 14.1 % (ref 11.5–14.5)
WBC: 4.7 10*3/uL (ref 3.8–10.6)

## 2015-05-31 LAB — PROTIME-INR
INR: 1.07
Prothrombin Time: 14.1 seconds (ref 11.4–15.0)

## 2015-05-31 MED ORDER — NITROGLYCERIN 0.4 MG SL SUBL
0.4000 mg | SUBLINGUAL_TABLET | SUBLINGUAL | Status: DC | PRN
  Administered 2015-05-31 (×2): 0.4 mg via SUBLINGUAL
  Filled 2015-05-31: qty 1

## 2015-05-31 MED ORDER — TECHNETIUM TO 99M ALBUMIN AGGREGATED
3.7610 | Freq: Once | INTRAVENOUS | Status: AC | PRN
  Administered 2015-05-31: 3.761 via INTRAVENOUS

## 2015-05-31 MED ORDER — MORPHINE SULFATE (PF) 4 MG/ML IV SOLN
INTRAVENOUS | Status: AC
  Administered 2015-05-31: 4 mg via INTRAVENOUS
  Filled 2015-05-31: qty 1

## 2015-05-31 MED ORDER — TECHNETIUM TC 99M DIETHYLENETRIAME-PENTAACETIC ACID
31.9300 | Freq: Once | INTRAVENOUS | Status: AC | PRN
  Administered 2015-05-31: 31.93 via INTRAVENOUS

## 2015-05-31 MED ORDER — ONDANSETRON HCL 4 MG/2ML IJ SOLN
4.0000 mg | Freq: Once | INTRAMUSCULAR | Status: AC
  Administered 2015-05-31: 4 mg via INTRAVENOUS
  Filled 2015-05-31: qty 2

## 2015-05-31 MED ORDER — ASPIRIN 81 MG PO CHEW
324.0000 mg | CHEWABLE_TABLET | Freq: Once | ORAL | Status: AC
  Administered 2015-05-31: 324 mg via ORAL
  Filled 2015-05-31: qty 4

## 2015-05-31 MED ORDER — MORPHINE SULFATE (PF) 4 MG/ML IV SOLN
4.0000 mg | Freq: Once | INTRAVENOUS | Status: AC
  Administered 2015-05-31: 4 mg via INTRAVENOUS

## 2015-05-31 NOTE — ED Notes (Signed)
Received call from [facility] at Nuclear Medicine, [place] Pulmonary to be done at 1730

## 2015-05-31 NOTE — ED Notes (Signed)
Pt presents with chest pain since last night; took 1 nitro and tramadol with no relief and was awakened at 0400 with chest pain.

## 2015-05-31 NOTE — ED Notes (Signed)
Patient placed on the monitor at this time.

## 2015-05-31 NOTE — ED Provider Notes (Signed)
[facility] Emergency Department Provider Note   ____________________________________________  Time seen: Approximately 10 AM  I have reviewed the triage vital signs and the nursing notes.   HISTORY  Chief Complaint Chest Pain   HPI NYAIRE AMJAD is a 75 y.o. male with a history of ischemic cardiomyopathy as well as coronary artery disease who is presenting to the emergency department today with intermittent central chest pain since yesterday. He said that the pain was 10 out of 10 last night and associated with shortness of breath. He says that the pain has been intermittent and is now 4 out of 10. He says at its max was a 10 out of 10. He denies any radiation. Denies any nausea, vomiting or diaphoresis. Denies any exertional factors.   Past Medical History  Diagnosis Date  . Cardiomyopathy, ischemic     a. 04/2012 Echo: EF 40-45%;  b. 08/2013 Echo: EF 45-50%, mild glob HK, mod lat/post HK, diast dysfxn, mildly dil LA, mild MR, nl RVSP; c. 01/2015 Echo: EF 40-45%, Gr 1 DD, mildly dil LA.  Marland Kitchen Hyperlipidemia   . Carotid arterial disease ([place])     a. 05/2013 Carotid U/S; bilat 40-50% ICA stenosis.  Marland Kitchen CAD (coronary artery disease)     a. 04/2012 Cath: 3VD->Med Rx;  b. 04/2013 Cath/PCI: RCA 70p, 65m, 95d. Mid & distal RCA treated with 2 DES; c. 03/2014 PCI: LAD 80 (3.0x23 Xience Alpine DES), 30 ISR in RCA; d. 06/2014 CABG x 2 ([facility]) LIMA->LAD, VG->OM; e. 11/2014 Neg MV; f. 12/2014 Cath ([facility]): patent grafts->Med Rx.  . Chronic Chest Pain   . Chronic systolic CHF (congestive heart failure) ([place])     a. 01/2015 Echo: EF 40-45%.    Patient Active Problem List   Diagnosis Date Noted  . Ischemic cardiomyopathy   . Chronic systolic CHF (congestive heart failure) ([place])   . Chronic Chest Pain   . Coronary artery disease involving coronary bypass graft of native heart with angina pectoris with documented spasm ([place])   . Angina pectoris ([place])   . Anxiety   . Hyperlipemia    . Chronic diastolic heart failure ([place]) 12/21/2014  . Unstable angina ([place]) 12/08/2014  . PAF (paroxysmal atrial fibrillation) ([place]) 12/08/2014  . Chronic anticoagulation 12/08/2014  . Anemia 04/20/2014  . 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 Cardiomyopathy, ischemic   . HTN (hypertension)   . Carotid arterial disease ([place])   . S/P drug eluting coronary stent placement 05/30/2013  . Chest pain 05/05/2013  . SOB (shortness of breath) 05/05/2013  . CAD (coronary artery disease) 05/05/2013  . Hyperlipidemia 05/05/2013  . History of smoking 05/05/2013  . Cardiac angina ([place]) 05/05/2013    Past Surgical History  Procedure Laterality Date  . Right shoulder    . Carpal tunnel release      right hand  . Tonsillectomy    . Cataract extraction w/phaco Left 09/16/2014    Procedure: CATARACT EXTRACTION PHACO AND INTRAOCULAR LENS PLACEMENT (IOC);  Surgeon: Leandrew Koyanagi, MD;  Location: [place];  Service: Ophthalmology;  Laterality: Left;  . Coronary artery bypass graft  06-26-14  . Cardiac catheterization  05/01/2012  . Cardiac catheterization  04/2013    [facility];x3 stent  . Coronary angioplasty with stent placement  04/13/2014  . Cardiac catheterization  01/11/15     [facility]  . De quervain's release Left 08/22/2012  . Colonoscopy    . Esophagogastroduodenoscopy (egd) with propofol N/A 04/26/2015    Procedure: ESOPHAGOGASTRODUODENOSCOPY (EGD)  WITH PROPOFOL;  Surgeon: Hulen Luster, MD;  Location: [facility] ENDOSCOPY;  Service: Gastroenterology;  Laterality: N/A;    Current Outpatient Rx  Name  Route  Sig  Dispense  Refill  . apixaban (ELIQUIS) 5 MG TABS tablet   Oral   Take 5 mg by mouth 2 (two) times daily.         Marland Kitchen atorvastatin (LIPITOR) 80 MG tablet   Oral   Take 80 mg by mouth at bedtime.         . isosorbide mononitrate (IMDUR) 60 MG 24 hr tablet   Oral   Take 1 tablet by mouth daily.         . metoprolol tartrate (LOPRESSOR) 25 MG tablet      Take 25 mg tablet by  mouth am and 12.5 mg tablet by mouth pm daily.   60 tablet   3   . nitroGLYCERIN (NITROSTAT) 0.4 MG SL tablet   Sublingual   Place 1 tablet (0.4 mg total) under the tongue every 5 (five) minutes as needed for chest pain.   25 tablet   6   . traMADol (ULTRAM) 50 MG tablet   Oral   Take 1 tablet (50 mg total) by mouth every 4 (four) hours as needed for moderate pain.   30 tablet   3   . zolpidem (AMBIEN) 5 MG tablet   Oral   Take 5 mg by mouth at bedtime as needed for sleep.           Allergies Contrast media  and Oxycodone hcl  Family History  Problem Relation Age of Onset  . Heart attack Father 73    MI  . Cancer Maternal Uncle     Social History Social History  Substance Use Topics  . Smoking status: Former Smoker -- 1.00 packs/day for 35 years    Types: Cigarettes    Start date: 05/02/2009  . Smokeless tobacco: Never Used  . Alcohol Use: No    Review of Systems Constitutional: No fever/chills Eyes: No visual changes. ENT: No sore throat. Cardiovascular: As above Respiratory: As above  Gastrointestinal: No abdominal pain.  No nausea, no vomiting.  No diarrhea.  No constipation. Genitourinary: Negative for dysuria. Musculoskeletal: Negative for back pain. Skin: Negative for rash. Neurological: Negative for headaches, focal weakness or numbness.  10-point ROS otherwise negative.  ____________________________________________   PHYSICAL EXAM:  VITAL SIGNS: ED Triage Vitals  Enc Vitals Group     BP 05/31/15 0915 180/86 mmHg     Pulse Rate 05/31/15 0915 69     Resp 05/31/15 0915 20     Temp 05/31/15 0915 98.1 F (36.7 C)     Temp Source 05/31/15 0915 Oral     SpO2 05/31/15 0915 100 %     Weight 05/31/15 0915 177 lb (80.287 kg)     Height 05/31/15 0915 5\' 10"  (1.778 m)     Head Cir --      Peak Flow --      Pain Score 05/31/15 0918 7     Pain Loc --      Pain Edu? --      Excl. in [place]? --     Constitutional: Alert and oriented. Well  appearing and in no acute distress. Eyes: Conjunctivae are normal. PERRL. EOMI. Head: Atraumatic. Nose: No congestion/rhinnorhea. Mouth/Throat: Mucous membranes are moist.   Neck: No stridor.   Cardiovascular: Normal rate, regular rhythm. Grossly normal heart sounds.   Respiratory: Normal respiratory  effort.  No retractions. Lungs CTAB. Gastrointestinal: Soft and nontender. No distention. No CVA tenderness. Musculoskeletal: No lower extremity tenderness nor edema.  No joint effusions. Neurologic:  Normal speech and language. No gross focal neurologic deficits are appreciated.  Skin:  Skin is warm, dry and intact. No rash noted. Psychiatric: Mood and affect are normal. Speech and behavior are normal.  ____________________________________________   LABS (all labs ordered are listed, but only abnormal results are displayed)  Labs Reviewed  BASIC METABOLIC PANEL - Abnormal; Notable for the following:    Glucose, Bld 115 (*)    BUN 22 (*)    All other components within normal limits  FIBRIN DERIVATIVES D-DIMER ([facility] ONLY) - Abnormal; Notable for the following:    Fibrin derivatives D-dimer ([facility]) 781 (*)    All other components within normal limits  CBC  TROPONIN I  PROTIME-INR  TROPONIN I   ____________________________________________  EKG  ED ECG REPORT I, Doran Stabler, the attending physician, personally viewed and interpreted this ECG.   Date: 05/31/2015  EKG Time: 1056  Rate: 50  Rhythm: sinus bradycardia  Axis: Normal  Intervals:nonspecific intraventricular conduction delay  ST&T Change: No ST segment elevation or depression.  No abnormal t wave inversion.    ED ECG REPORT I, Doran Stabler, the attending physician, personally viewed and interpreted this ECG.   Date: 05/31/2015  EKG Time: 0916  Rate: 69  Rhythm: normal sinus rhythm  Axis: Normal axis  Intervals:nonspecific intraventricular conduction delay  ST&T Change: no st segment elevation or  depression.  No abnormal t wave inversion.    ____________________________________________  [id number]   DG Chest 2 View (Final result) Result time: 05/31/15 09:41:41   Final result by Rad Results In Interface (05/31/15 09:41:41)   Narrative:   CLINICAL DATA: Chest pain for 1 day  EXAM: CHEST 2 VIEW  COMPARISON: February 08, 2015  FINDINGS: There is no edema or consolidation. Heart size and pulmonary vascularity are normal. No adenopathy. Patient is status post coronary artery bypass grafting. No bone lesions. No pneumothorax.  IMPRESSION: No edema or consolidation.   Electronically Signed By: Lowella Grip III M.D. On: 05/31/2015 09:41          ____________________________________________   PROCEDURES  ____________________________________________   INITIAL IMPRESSION / ASSESSMENT AND PLAN / ED COURSE  Pertinent labs & imaging results that were available during my care of the patient were reviewed by me and considered in my medical decision making (see chart for details).  ----------------------------------------- 3:37 PM on 05/31/2015 -----------------------------------------  Discussed the case with the patient's cardiologist, Dr. Rockey Situ, who says that the patient has had multiple catheterizations as well as stress test which of alternative out negative. He said that the patient had at one point wanted a second opinion and wanted to [place] who did a bypass on the patient but he is still having the pain despite the bypass surgery. Despite being on nitrates and Ranexa the patient is still having chest pains. I discussed the workup with Dr. Rockey Situ and he recommends follow-up with pain management at this point for further evaluation and treatment. Patient without any distress here in the emergency department but requiring several doses of pain medication. Michela Pitcher that was feeling more short of breath than usual. Has a positive d-dimer. Contrast allergy.  Pending VQ scan at this time. Signed out to Dr. Jacqualine Code. ____________________________________________   FINAL CLINICAL IMPRESSION(S) / ED DIAGNOSES  Final diagnoses:  Chest pain   Dyspnea  NEW MEDICATIONS STARTED DURING THIS VISIT:  New Prescriptions   No medications on file     Note:  This document was prepared using Dragon voice recognition software and may include unintentional dictation errors.    Orbie Pyo, MD 05/31/15 [phone]

## 2015-05-31 NOTE — Discharge Instructions (Signed)
You have been seen in the Emergency Department (ED) today for chest pain.  As we have discussed today’s test results are normal, but you may require further testing. ° °Please follow up with the recommended doctor as instructed above in these documents regarding today’s emergent visit and your recent symptoms to discuss further management.  Continue to take your regular medications. If you are not doing so already, please also take a daily baby aspirin (81 mg), at least until you follow up with your doctor. ° °Return to the Emergency Department (ED) if you experience any further chest pain/pressure/tightness, difficulty breathing, or sudden sweating, or other symptoms that concern you. ° ° °Chest Pain Observation °It is often hard to give a specific diagnosis for the cause of chest pain. Among other possibilities your symptoms might be caused by inadequate oxygen delivery to your heart (angina). Angina that is not treated or evaluated can lead to a heart attack (myocardial infarction) or death. °Blood tests, electrocardiograms, and X-rays may have been done to help determine a possible cause of your chest pain. After evaluation and observation, your health care provider has determined that it is unlikely your pain was caused by an unstable condition that requires hospitalization. However, a full evaluation of your pain may need to be completed, with additional diagnostic testing as directed. It is very important to keep your follow-up appointments. Not keeping your follow-up appointments could result in permanent heart damage, disability, or death. If there is any problem keeping your follow-up appointments, you must call your health care provider. °HOME CARE INSTRUCTIONS  °Due to the slight chance that your pain could be angina, it is important to follow your health care provider's treatment plan and also maintain a healthy lifestyle: °· Maintain or work toward achieving a healthy weight. °· Stay physically active  and exercise regularly. °· Decrease your salt intake. °· Eat a balanced, healthy diet. Talk to a dietitian to learn about heart-healthy foods. °· Increase your fiber intake by including whole grains, vegetables, fruits, and nuts in your diet. °· Avoid situations that cause stress, anger, or depression. °· Take medicines as advised by your health care provider. Report any side effects to your health care provider. Do not stop medicines or adjust the dosages on your own. °· Quit smoking. Do not use nicotine patches or gum until you check with your health care provider. °· Keep your blood pressure, blood sugar, and cholesterol levels within normal limits. °· Limit alcohol intake to no more than 1 drink per day for women who are not pregnant and 2 drinks per day for men. °· Do not abuse drugs. °SEEK IMMEDIATE MEDICAL CARE IF: °You have severe chest pain or pressure which may include symptoms such as: °· You feel pain or pressure in your arms, neck, jaw, or back. °· You have severe back or abdominal pain, feel sick to your stomach (nauseous), or throw up (vomit). °· You are sweating profusely. °· You are having a fast or irregular heartbeat. °· You feel short of breath while at rest. °· You notice increasing shortness of breath during rest, sleep, or with activity. °· You have chest pain that does not get better after rest or after taking your usual medicine. °· You wake from sleep with chest pain. °· You are unable to sleep because you cannot breathe. °· You develop a frequent cough or you are coughing up blood. °· You feel dizzy, faint, or experience extreme fatigue. °· You develop severe weakness, dizziness, fainting,   or chills. °Any of these symptoms may represent a serious problem that is an emergency. Do not wait to see if the symptoms will go away. Call your local emergency services (911 in the [place] Do not drive yourself to the hospital. °MAKE SURE YOU: °· Understand these instructions. °· Will watch your  condition. °· Will get help right away if you are not doing well or get worse. °  °This information is not intended to replace advice given to you by your health care provider. Make sure you discuss any questions you have with your health care provider. °  °Document Released: 02/11/2010 Document Revised: 01/14/2013 Document Reviewed: 07/11/2012 °Elsevier Interactive Patient Education ©2016 Elsevier Inc[employer] ° °

## 2015-05-31 NOTE — ED Notes (Signed)
Pt c/o CP on return from scan.  MD Quale notified, EKG performed.

## 2015-05-31 NOTE — Telephone Encounter (Signed)
Pt calling stating last night he was having severe CP Took nitro and having sob took pain medication as well Was able to sleep a bit but woke up with the pains.  Please advise

## 2015-05-31 NOTE — Telephone Encounter (Signed)
Spoke w/ pt.  He reports that he was up all night w/ chest pain, took nitro w/ little relief. He is still having chest pain now, would like to come over for eval.  Advised pt that if he is having active chest pain, he will need to proceed to the ED.  He will have his wife take him there now.

## 2015-05-31 NOTE — ED Provider Notes (Signed)
Filed Vitals:   05/31/15 1815 05/31/15 1900  BP:  117/62  Pulse: 67 67  Temp:    Resp: 14 17   Patient alert, resting comfortably at this time. In no distress without painful concern or complaints.  Perfusion scan negative for pulmonary embolus him and given he is anticoagulated I think this possibility is extremely low.  As per discussion with Dr. Dineen Kid, plan is to discharge the patient to home. He is going to follow up with pain management clinic.  I find no evidence of acute cardiac or pulmonary etiology at this time.  Repeat EKG performed for observation status at 1806 Normal sinus rhythm Heart rate 70 Care is 150 QTc 450 No evidence of acute ischemic abnormality, as compared with the patient's previous EKG from today, no significant change is found.   Delman Kitten, MD 05/31/15 [phone]

## 2015-05-31 NOTE — ED Notes (Signed)
Patient transported to X-ray 

## 2015-06-01 ENCOUNTER — Telehealth: Payer: Self-pay | Admitting: Cardiovascular Disease

## 2015-06-01 NOTE — Telephone Encounter (Signed)
Pt was seen in ED for CP on 05/31/15 Pt is coming 06/29/15 to see Christell Faith Pt is on wait list

## 2015-06-02 ENCOUNTER — Encounter: Payer: Self-pay | Admitting: Cardiovascular Disease

## 2015-06-02 ENCOUNTER — Ambulatory Visit (INDEPENDENT_AMBULATORY_CARE_PROVIDER_SITE_OTHER): Payer: Medicare HMO | Admitting: Cardiovascular Disease

## 2015-06-02 VITALS — BP 108/60 | HR 62 | Ht 70.0 in | Wt 175.0 lb

## 2015-06-02 DIAGNOSIS — I255 Ischemic cardiomyopathy: Secondary | ICD-10-CM

## 2015-06-02 DIAGNOSIS — I480 Paroxysmal atrial fibrillation: Secondary | ICD-10-CM

## 2015-06-02 DIAGNOSIS — E785 Hyperlipidemia, unspecified: Secondary | ICD-10-CM

## 2015-06-02 DIAGNOSIS — I25701 Atherosclerosis of coronary artery bypass graft(s), unspecified, with angina pectoris with documented spasm: Secondary | ICD-10-CM

## 2015-06-02 DIAGNOSIS — I200 Unstable angina: Secondary | ICD-10-CM | POA: Diagnosis not present

## 2015-06-02 DIAGNOSIS — I209 Angina pectoris, unspecified: Secondary | ICD-10-CM

## 2015-06-02 NOTE — Assessment & Plan Note (Signed)
He continues to have periods of chest pain, sometimes with exertion, sometimes at rest. No improvement with stenting, bypass surgery in the past, normal stress tests. Several visits to the hospital and emergency room, cardiac enzymes normal. Often times exerting himself with no reproducible chest pain, then will present without provocation or perhaps with raking.  Long discussion with him concerning his prior history, details. Discussed various options for him. He is interested in performing a treadmill Myoview. Feels this may show something versus a pharmacologic Myoview. Given his chest pain symptoms concerning for angina, treadmill Myoview has been ordered

## 2015-06-02 NOTE — Assessment & Plan Note (Signed)
Cholesterol is at goal on the current lipid regimen. No changes to the medications were made.  

## 2015-06-02 NOTE — Progress Notes (Signed)
Patient ID: Scott [name], male    DOB: 08/22/1940, 75 y.o.   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  Cardiac catheterization April 2014 Cardiac catheterization April 2015 with stent to the RCA Cardiac catheterization with stent placement March 2016, DES to the mid LAD Notes indicating bypass surgery June 2016 at [facility] test in November 2016 for chronic chest pain showing no ischemia He had follow-up cardiac catheterization December 2016 at [facility] Through this time his chest pain has not improved, last cardiac catheterization by report with patent stents  Since then he has been in the hospital with chronic chest pain, cardiac enzymes negative, no workup done at that time given recent catheterization  Again back in the emergency room several days ago with chronic pain, reports it was severe 3 days ago on a Sunday Interestingly reports he is able to exert himself without bringing on symptoms, such as walking, other exercise. Other times will bring his symptoms on with raking or other maneuvers, other times at rest. Sunday reports symptoms resolved with nitroglycerin but other times reports nitroglycerin does not resolve his pain. He has been given tramadol for pain but does not like to take this. Does not like taking pain medication in general. Often will delay taking nitroglycerin or pain pill when he has discomfort  He has continued to have chest discomfort despite titrating doses of isosorbide and ranexa. Long discussion with him today concerning the above Previously not interested in cardiac rehabilitation  EKG on today's visit shows normal sinus rhythm with rate 62 bpm, intraventricular conduction delay, Consider old  inferior MI PVC  Other past medical history history of CAD dating back to April 2014 at which time he was found to have severe distal RCA disease with left to right collaterals, a subtotally occluded left circumflex, and moderate, diffuse LAD disease. He was initially medically managed.   In April 2015, he underwent repeat catheterization secondary to ongoing chest pain, showing stable moderate to severe disease. He then underwent successful RCA stenting.   In March 2016, due to recurrent chest pain, he underwent successful stenting of the LAD.    he had recurrent chest pain again in June 2016 and was seen at [facility]  He underwent CABG 2 with a LIMA to the LAD and a vein graft to the obtuse marginal.   following CABG, he continued to have persistent/chronic low-level left-sided and midsternal chest pain.  He had repeat hospitalizations  with stress testing in November at [facility], which was negative,  catheterization at [facility] in December 2016, which apparently showed patent grafts.  Lab work in the hospital  showing total cholesterol 142, LDL 77, HDL 47  Echocardiogram 03/02/2014 showing normal LV function, essentially normal study  Echocardiogram 04/22/2012 showing ejection fraction 40%, no mention of wall motion abnormality, mild MR, TR     Allergies  Allergen Reactions  . Contrast Media  [Iodinated Diagnostic Agents] Hives  . Oxycodone Hcl Itching    Outpatient Encounter Prescriptions as of 06/02/2015  Medication Sig  . apixaban (ELIQUIS) 5 MG TABS tablet Take 5 mg by mouth 2 (two) times daily.  Marland Kitchen atorvastatin (LIPITOR) 80 MG tablet Take 80 mg by mouth at bedtime.  . isosorbide mononitrate (  IMDUR) 60 MG 24 hr tablet Take 1 tablet by mouth daily.  . metoprolol tartrate (LOPRESSOR) 25 MG tablet Take 25 mg tablet by mouth am and 12.5 mg tablet by mouth pm daily.  . nitroGLYCERIN (NITROSTAT) 0.4 MG SL tablet Place 1 tablet (0.4 mg total) under the tongue every 5 (five) minutes as needed  for chest pain.  . traMADol (ULTRAM) 50 MG tablet Take 1 tablet (50 mg total) by mouth every 4 (four) hours as needed for moderate pain.  Marland Kitchen zolpidem (AMBIEN) 5 MG tablet Take 5 mg by mouth at bedtime as needed for sleep.   No facility-administered encounter medications on file as of 06/02/2015.    Past Medical History  Diagnosis Date  . Cardiomyopathy, ischemic     a. 04/2012 Echo: EF 40-45%;  b. 08/2013 Echo: EF 45-50%, mild glob HK, mod lat/post HK, diast dysfxn, mildly dil LA, mild MR, nl RVSP; c. 01/2015 Echo: EF 40-45%, Gr 1 DD, mildly dil LA.  Marland Kitchen Hyperlipidemia   . Carotid arterial disease ([place])     a. 05/2013 Carotid U/S; bilat 40-50% ICA stenosis.  Marland Kitchen CAD (coronary artery disease)     a. 04/2012 Cath: 3VD->Med Rx;  b. 04/2013 Cath/PCI: RCA 70p, 16m, 95d. Mid & distal RCA treated with 2 DES; c. 03/2014 PCI: LAD 80 (3.0x23 Xience Alpine DES), 30 ISR in RCA; d. 06/2014 CABG x 2 ([facility]) LIMA->LAD, VG->OM; e. 11/2014 Neg MV; f. 12/2014 Cath ([facility]): patent grafts->Med Rx.  . Chronic Chest Pain   . Chronic systolic CHF (congestive heart failure) ([place])     a. 01/2015 Echo: EF 40-45%.    Past Surgical History  Procedure Laterality Date  . Right shoulder    . Carpal tunnel release      right hand  . Tonsillectomy    . Cataract extraction w/phaco Left 09/16/2014    Procedure: CATARACT EXTRACTION PHACO AND INTRAOCULAR LENS PLACEMENT (IOC);  Surgeon: Leandrew Koyanagi, MD;  Location: [place];  Service: Ophthalmology;  Laterality: Left;  . Coronary artery bypass graft  06-26-14  . Cardiac catheterization  05/01/2012  . Cardiac catheterization  04/2013    [facility];x3 stent  . Coronary angioplasty with stent placement  04/13/2014  . Cardiac catheterization  01/11/15     [facility]  . De quervain's release Left 08/22/2012  . Colonoscopy    . Esophagogastroduodenoscopy (egd) with propofol N/A 04/26/2015    Procedure: ESOPHAGOGASTRODUODENOSCOPY (EGD) WITH PROPOFOL;  Surgeon: Hulen Luster, MD;  Location: [facility]  ENDOSCOPY;  Service: Gastroenterology;  Laterality: N/A;    Social History  reports that he has quit smoking. His smoking use included Cigarettes. He started smoking about 6 years ago. He has a 35 pack-year smoking history. He has never used smokeless tobacco. He reports that he does not drink alcohol or use illicit drugs.  Family History family history includes Cancer in his maternal uncle; Heart attack (age of onset: 55) in his father.  Review of Systems  Constitutional: Negative.   Respiratory: Negative.   Cardiovascular: Negative.   Gastrointestinal: Negative.   Musculoskeletal: Negative.   Neurological: Negative.   Hematological: Negative.   All other systems reviewed and are negative.   BP 108/60 mmHg  Pulse 62  Ht 5\' 10"  (1.778 m)  Wt 175 lb (79.379 kg)  BMI 25.11 kg/m2  Physical Exam  Constitutional: He is oriented to person, place, and time. He appears well-developed and well-nourished.  HENT:  Head: Normocephalic.  Nose: Nose normal.  Mouth/Throat: Oropharynx is  clear and moist.  Eyes: Conjunctivae are normal. Pupils are equal, round, and reactive to light.  Neck: Normal range of motion. Neck supple. No JVD present.  Cardiovascular: Normal rate, regular rhythm, S1 normal, S2 normal and intact distal pulses.  Exam reveals no gallop and no friction rub.   Murmur heard.  Systolic murmur is present with a grade of 2/6  Pulmonary/Chest: Effort normal and breath sounds normal. No respiratory distress. He has no wheezes. He has no rales. He exhibits no tenderness.  Abdominal: Soft. Bowel sounds are normal. He exhibits no distension. There is no tenderness.  Musculoskeletal: Normal range of motion. He exhibits no edema or tenderness.  Lymphadenopathy:    He has no cervical adenopathy.  Neurological: He is alert and oriented to person, place, and time. Coordination normal.  Skin: Skin is warm and dry. No rash noted. No erythema.  Psychiatric: He has a normal mood and  affect. His behavior is normal. Judgment and thought content normal.      Assessment and Plan   Nursing note and vitals reviewed.

## 2015-06-02 NOTE — Assessment & Plan Note (Signed)
Appears relatively euvolemic on today's visit. In general has not had episodes of decompensated systolic CHF. He is not requiring high-dose diuretics Blood pressure low, will avoid ACE inhibitor, ARB for now   Total encounter time more than 25 minutes  Greater than 50% was spent in counseling and coordination of care with the patient

## 2015-06-02 NOTE — Assessment & Plan Note (Signed)
Long discussion as detailed above. Treadmill Myoview ordered as above

## 2015-06-02 NOTE — Assessment & Plan Note (Signed)
Normal sinus rhythm on today's visit, we'll continue anticoagulation Recommended he continue on his beta blocker

## 2015-06-02 NOTE — Patient Instructions (Addendum)
You are doing well. No medication changes were made.  We will order a treadmill myoview for angina pain Day before and day of the test stop the isosorbide and metoprolol (night before and morning of the test)  Please call us if you have new issues that need to be addressed before your next appt.  Your physician wants you to follow-up in: 6 months.  You will receive a reminder letter in the mail two months in advance. If you don't receive a letter, please call our office to schedule the follow-up appointment.  [place]  Your caregiver has ordered a Stress Test with nuclear imaging. The purpose of this test is to evaluate the blood supply to your heart muscle. This procedure is referred to as a "Non-Invasive Stress Test." This is because other than having an IV started in your vein, nothing is inserted or "invades" your body. Cardiac stress tests are done to find areas of poor blood flow to the heart by determining the extent of coronary artery disease (CAD). Some patients exercise on a treadmill, which naturally increases the blood flow to your heart, while others who are  unable to walk on a treadmill due to physical limitations have a pharmacologic/chemical stress agent called Lexiscan . This medicine will mimic walking on a treadmill by temporarily increasing your coronary blood flow.   Please note: these test may take anywhere between 2-4 hours to complete  PLEASE REPORT TO [facility] AT THE FIRST DESK WILL DIRECT YOU WHERE TO GO  Date of Procedure:___Tuesday, May 23_______  Arrival Time for Procedure:____7:15 am_________  Instructions regarding medication:   _X__:  Hold ISOSORBIDE & METOPROLOL  night before and morning of procedure  How to prepare for your Myoview test:   Do not eat or drink after midnight  No caffeine for 24 hours prior to test  No smoking 24 hours prior to test.  Your medication may be taken with water.  If your doctor  stopped a medication because of this test, do not take that medication.  Ladies, please do not wear dresses.  Skirts or pants are appropriate. Please wear a short sleeve shirt.  No perfume, cologne or lotion.  Wear comfortable walking shoes. No heels!  Cardiac Nuclear Scanning A cardiac nuclear scan is used to check your heart for problems, such as the following:  A portion of the heart is not getting enough blood.  Part of the heart muscle has died, which happens with a heart attack.  The heart wall is not working normally.  In this test, a radioactive dye (tracer) is injected into your bloodstream. After the tracer has traveled to your heart, a scanning device is used to measure how much of the tracer is absorbed by or distributed to various areas of your heart. LET [facility] CARE PROVIDER KNOW ABOUT:  Any allergies you have.  All medicines you are taking, including vitamins, herbs, eye drops, creams, and over-the-counter medicines.  Previous problems you or members of your family have had with the use of anesthetics.  Any blood disorders you have.  Previous surgeries you have had.  Medical conditions you have.  RISKS AND COMPLICATIONS Generally, this is a safe procedure. However, as with any procedure, problems can occur. Possible problems include:   Serious chest pain.  Rapid heartbeat.  Sensation of warmth in your chest. This usually passes quickly. BEFORE THE PROCEDURE Ask your health care provider about changing or stopping your regular medicines. PROCEDURE  This procedure is usually done at a hospital and takes 2-4 hours.  An IV tube is inserted into one of your veins.  Your health care provider will inject a small amount of radioactive tracer through the tube.  You will then wait for 20-40 minutes while the tracer travels through your bloodstream.  You will lie down on an exam table so images of your heart can be taken. Images will be taken for about 15-20  minutes.  You will exercise on a treadmill or stationary bike. While you exercise, your heart activity will be monitored with an electrocardiogram (ECG), and your blood pressure will be checked.  If you are unable to exercise, you may be given a medicine to make your heart beat faster.  When blood flow to your heart has peaked, tracer will again be injected through the IV tube.  After 20-40 minutes, you will get back on the exam table and have more images taken of your heart.  When the procedure is over, your IV tube will be removed. AFTER THE PROCEDURE  You will likely be able to leave shortly after the test. Unless your health care provider tells you otherwise, you may return to your normal schedule, including diet, activities, and medicines.  Make sure you find out how and when you will get your test results.   This information is not intended to replace advice given to you by your health care provider. Make sure you discuss any questions you have with your health care provider.   Document Released: 02/04/2004 Document Revised: 01/14/2013 Document Reviewed: 12/18/2012 Elsevier Interactive Patient Education [employer].

## 2015-06-15 ENCOUNTER — Encounter
Admission: RE | Admit: 2015-06-15 | Discharge: 2015-06-15 | Disposition: A | Discharge status: Home / Self Care | Payer: Medicare HMO | Source: Ambulatory Visit | Attending: Cardiovascular Disease | Admitting: Cardiovascular Disease

## 2015-06-15 DIAGNOSIS — I209 Angina pectoris, unspecified: Secondary | ICD-10-CM | POA: Diagnosis not present

## 2015-06-15 DIAGNOSIS — R931 Abnormal findings on diagnostic imaging of heart and coronary circulation: Secondary | ICD-10-CM | POA: Diagnosis not present

## 2015-06-15 DIAGNOSIS — I480 Paroxysmal atrial fibrillation: Secondary | ICD-10-CM | POA: Diagnosis not present

## 2015-06-15 LAB — NM MYOCAR MULTI W/SPECT W/WALL MOTION / EF
CHL CUP NUCLEAR SRS: 6
CHL CUP NUCLEAR SSS: 4
CHL CUP RESTING HR STRESS: 64 {beats}/min
CHL CUP STRESS STAGE 1 SPEED: 0 mph
CHL CUP STRESS STAGE 3 GRADE: 10 %
CHL CUP STRESS STAGE 3 SPEED: 1.7 mph
CHL CUP STRESS STAGE 4 HR: 104 {beats}/min
CHL CUP STRESS STAGE 4 SBP: 150 mmHg
CHL CUP STRESS STAGE 4 SPEED: 2.5 mph
CHL CUP STRESS STAGE 5 DBP: 62 mmHg
CHL CUP STRESS STAGE 5 GRADE: 14 %
CHL CUP STRESS STAGE 5 SBP: 164 mmHg
CHL CUP STRESS STAGE 5 SPEED: 3.4 mph
CHL CUP STRESS STAGE 6 GRADE: 0 %
CHL CUP STRESS STAGE 6 HR: 108 {beats}/min
CHL CUP STRESS STAGE 6 SPEED: 0 mph
CHL CUP STRESS STAGE 7 DBP: 76 mmHg
CHL CUP STRESS STAGE 7 GRADE: 0 %
CSEPHR: 85 %
Estimated workload: 10.1 METS
Exercise duration (min): 8 min
Exercise duration (sec): 51 s
LV dias vol: 120 mL (ref 62–150)
LVSYSVOL: 63 mL
Peak BP: 164 mmHg
Peak HR: 127 {beats}/min
Percent of predicted max HR: 85 %
SDS: 0
Stage 1 Grade: 0 %
Stage 1 HR: 60 {beats}/min
Stage 2 Grade: 0 %
Stage 2 HR: 76 {beats}/min
Stage 2 Speed: 0.1 mph
Stage 3 DBP: 53 mmHg
Stage 3 HR: 89 {beats}/min
Stage 3 SBP: 149 mmHg
Stage 4 DBP: 49 mmHg
Stage 4 Grade: 12 %
Stage 5 HR: 127 {beats}/min
Stage 7 HR: 75 {beats}/min
Stage 7 SBP: 153 mmHg
Stage 7 Speed: 0 mph
TID: 1.04

## 2015-06-15 MED ORDER — TECHNETIUM TC 99M TETROFOSMIN IV KIT
13.0000 | PACK | Freq: Once | INTRAVENOUS | Status: AC | PRN
  Administered 2015-06-15: 13.774 via INTRAVENOUS

## 2015-06-15 MED ORDER — TECHNETIUM TC 99M TETROFOSMIN IV KIT
30.0000 | PACK | Freq: Once | INTRAVENOUS | Status: AC | PRN
  Administered 2015-06-15: 31.58 via INTRAVENOUS

## 2015-06-28 ENCOUNTER — Telehealth: Payer: Self-pay | Admitting: Cardiovascular Disease

## 2015-06-28 NOTE — Telephone Encounter (Signed)
Pt calling stating he's having some chest pains, off and on for a few days Thursday it got back Yesterday took 2 nitro and TraMADol.  It did seem to help and he then went to bed.  Would like to know what he can do for the pain The tramadol makes his get really itchy  States this morning he's a bit nauseated.  Not sure why.  Please advise.

## 2015-06-28 NOTE — Telephone Encounter (Signed)
Spoke w/ pt.  He reports continued anginal pain.  He states that he does not feel that Tramadol is strong enough to handle his pain, but is making him sick on his stomach.  He would like an alternate pain med sent in.  Advised him that Dr. Rockey Situ is out of the office this week and that we cannot prescribe pain meds. Recommended he call his PCP or consider the pain clinic.  He is agreeable and will call back if we can be of further assistance.

## 2015-06-29 ENCOUNTER — Encounter: Payer: Medicare HMO | Admitting: Physician Assistant

## 2015-07-12 ENCOUNTER — Telehealth: Payer: Self-pay | Admitting: Cardiovascular Disease

## 2015-07-12 NOTE — Telephone Encounter (Signed)
Pt calling stating he called Pain clinic to make an appointment for we sent him to them But they told patient the doctor refused to see patient He then asked why, they told him that they do not need to give him a reason. Please advise.

## 2015-07-14 NOTE — Telephone Encounter (Signed)
Spoke with patient and let him know that I called [facility] to get him a consult there at a pain management clinic. They will be faxing [place] the consult form and then someone from their office will call the patient to schedule him an appointment.

## 2015-07-14 NOTE — Telephone Encounter (Signed)
Left voicemail message for patient to call back.

## 2015-07-14 NOTE — Telephone Encounter (Signed)
Patient called in and states that he was not able to get an appointment at the pain clinic here at [facility]. Let him know that I would check into his pain referral and get back in touch with him. [facility] pain clinic did not accept him so will see if I can change referral to another clinic.

## 2015-07-14 NOTE — Telephone Encounter (Signed)
Pt is calling back following up on his phone call from Monday. Please call and advise what he should do, due to pain clinic unable to see him.

## 2015-07-14 NOTE — Telephone Encounter (Signed)
Pt returning call. Please call back.

## 2015-07-14 NOTE — Telephone Encounter (Signed)
Spoke with [facility] In [place] regarding referral for pain management. They are not part of [facility] and therefore they will fax over a form that needs to be filled out for referral.

## 2015-07-15 NOTE — Telephone Encounter (Signed)
Spoke with Thurmond Butts from [facility] [phone] and requested that they fax over referral form for [place] to complete for this patient.

## 2015-07-15 NOTE — Telephone Encounter (Signed)
Referral form and requested records faxed to [facility] @ [phone]. Of note, there is a disclaimer on referral form that it can take 1-2 months for pt's records to be reviewed before MD decides whether to take pt or not.

## 2015-07-15 NOTE — Telephone Encounter (Signed)
Pain clinic referral form filled out and given to [facility] for her review prior to being faxed.

## 2015-07-18 DIAGNOSIS — R079 Chest pain, unspecified: Secondary | ICD-10-CM | POA: Diagnosis not present

## 2015-07-19 ENCOUNTER — Telehealth: Payer: Self-pay | Admitting: Cardiovascular Disease

## 2015-07-19 ENCOUNTER — Encounter: Payer: Self-pay | Admitting: Emergency Medicine

## 2015-07-19 ENCOUNTER — Other Ambulatory Visit: Payer: Self-pay

## 2015-07-19 ENCOUNTER — Emergency Department: Payer: Medicare HMO

## 2015-07-19 ENCOUNTER — Emergency Department
Admission: EM | Admit: 2015-07-19 | Discharge: 2015-07-19 | Disposition: A | Discharge status: Home / Self Care | Payer: Medicare HMO | Attending: Emergency Medicine | Admitting: Emergency Medicine

## 2015-07-19 DIAGNOSIS — I25720 Atherosclerosis of autologous artery coronary artery bypass graft(s) with unstable angina pectoris: Secondary | ICD-10-CM | POA: Insufficient documentation

## 2015-07-19 DIAGNOSIS — R0789 Other chest pain: Secondary | ICD-10-CM | POA: Insufficient documentation

## 2015-07-19 DIAGNOSIS — Z951 Presence of aortocoronary bypass graft: Secondary | ICD-10-CM | POA: Insufficient documentation

## 2015-07-19 DIAGNOSIS — I480 Paroxysmal atrial fibrillation: Secondary | ICD-10-CM | POA: Insufficient documentation

## 2015-07-19 DIAGNOSIS — Z87891 Personal history of nicotine dependence: Secondary | ICD-10-CM | POA: Insufficient documentation

## 2015-07-19 DIAGNOSIS — I5042 Chronic combined systolic (congestive) and diastolic (congestive) heart failure: Secondary | ICD-10-CM | POA: Diagnosis not present

## 2015-07-19 DIAGNOSIS — Z95818 Presence of other cardiac implants and grafts: Secondary | ICD-10-CM | POA: Insufficient documentation

## 2015-07-19 DIAGNOSIS — E785 Hyperlipidemia, unspecified: Secondary | ICD-10-CM | POA: Diagnosis not present

## 2015-07-19 DIAGNOSIS — R079 Chest pain, unspecified: Secondary | ICD-10-CM | POA: Diagnosis not present

## 2015-07-19 DIAGNOSIS — I110 Hypertensive heart disease with heart failure: Secondary | ICD-10-CM | POA: Insufficient documentation

## 2015-07-19 LAB — CBC WITH DIFFERENTIAL/PLATELET
BASOS ABS: 0 10*3/uL (ref 0–0.1)
BASOS PCT: 1 %
Eosinophils Absolute: 0.1 10*3/uL (ref 0–0.7)
Eosinophils Relative: 2 %
HCT: 42.2 % (ref 40.0–52.0)
HEMOGLOBIN: 14.4 g/dL (ref 13.0–18.0)
LYMPHS PCT: 42 %
Lymphs Abs: 2.4 10*3/uL (ref 1.0–3.6)
MCH: 30.6 pg (ref 26.0–34.0)
MCHC: 34.1 g/dL (ref 32.0–36.0)
MCV: 89.7 fL (ref 80.0–100.0)
Monocytes Absolute: 0.6 10*3/uL (ref 0.2–1.0)
Monocytes Relative: 10 %
NEUTROS ABS: 2.6 10*3/uL (ref 1.4–6.5)
NEUTROS PCT: 45 %
Platelets: 170 10*3/uL (ref 150–440)
RBC: 4.71 MIL/uL (ref 4.40–5.90)
RDW: 13.8 % (ref 11.5–14.5)
WBC: 5.7 10*3/uL (ref 3.8–10.6)

## 2015-07-19 LAB — BASIC METABOLIC PANEL
ANION GAP: 6 (ref 5–15)
BUN: 16 mg/dL (ref 6–20)
CALCIUM: 8.7 mg/dL — AB (ref 8.9–10.3)
CO2: 26 mmol/L (ref 22–32)
Chloride: 107 mmol/L (ref 101–111)
Creatinine, Ser: 1.1 mg/dL (ref 0.61–1.24)
GLUCOSE: 87 mg/dL (ref 65–99)
POTASSIUM: 3.4 mmol/L — AB (ref 3.5–5.1)
SODIUM: 139 mmol/L (ref 135–145)

## 2015-07-19 LAB — TROPONIN I

## 2015-07-19 MED ORDER — KETOROLAC TROMETHAMINE 30 MG/ML IJ SOLN
10.0000 mg | Freq: Once | INTRAMUSCULAR | Status: AC
  Administered 2015-07-19: 9.9 mg via INTRAVENOUS
  Filled 2015-07-19: qty 1

## 2015-07-19 NOTE — ED Notes (Signed)
Pt. States intermittent chest pain for the past couple weeks.  Pt. States while at church last night at around 6 pm.  Pt. States he took 3 nitro, with some relief.  Pt. Given 1 nitro and 324 ASA by EMS.

## 2015-07-19 NOTE — Discharge Instructions (Signed)

## 2015-07-19 NOTE — ED Provider Notes (Signed)
[facility] Emergency Department Provider Note  ____________________________________________  Time seen: 1:15 AM  I have reviewed the triage vital signs and the nursing notes.   HISTORY  Chief Complaint Chest Pain    HPI Scott [name] is a 75 y.o. male who complains of intermittent chest pain that is chronic in nature going on many months, worse since 6 PM tonight. He took aspirin and nitroglycerin without relief. He has seen his cardiologist for this, and so far they have not been able to identify a cause for his recurrent pains. Denies exertional symptoms. No shortness of breath radiation vomiting or diaphoresis. Pain is described as tightness and sharp. No shortness of breath. No vomiting. No fever.  Patient had recent ED visit one month ago for similar symptoms, negative workup including V/Q scan   Past Medical History  Diagnosis Date  . Cardiomyopathy, ischemic     a. 04/2012 Echo: EF 40-45%;  b. 08/2013 Echo: EF 45-50%, mild glob HK, mod lat/post HK, diast dysfxn, mildly dil LA, mild MR, nl RVSP; c. 01/2015 Echo: EF 40-45%, Gr 1 DD, mildly dil LA.  Marland Kitchen Hyperlipidemia   . Carotid arterial disease ([place])     a. 05/2013 Carotid U/S; bilat 40-50% ICA stenosis.  Marland Kitchen CAD (coronary artery disease)     a. 04/2012 Cath: 3VD->Med Rx;  b. 04/2013 Cath/PCI: RCA 70p, 7m, 95d. Mid & distal RCA treated with 2 DES; c. 03/2014 PCI: LAD 80 (3.0x23 Xience Alpine DES), 30 ISR in RCA; d. 06/2014 CABG x 2 ([facility]) LIMA->LAD, VG->OM; e. 11/2014 Neg MV; f. 12/2014 Cath ([facility]): patent grafts->Med Rx.  . Chronic Chest Pain   . Chronic systolic CHF (congestive heart failure) ([place])     a. 01/2015 Echo: EF 40-45%.     Patient Active Problem List   Diagnosis Date Noted  . Ischemic cardiomyopathy   . Chronic systolic CHF (congestive heart failure) ([place])   . Chronic Chest Pain   . Coronary artery disease involving coronary bypass graft of native heart with angina pectoris with documented  spasm ([place])   . Angina pectoris ([place])   . Anxiety   . Hyperlipemia   . Chronic diastolic heart failure ([place]) 12/21/2014  . Unstable angina ([place]) 12/08/2014  . PAF (paroxysmal atrial fibrillation) ([place]) 12/08/2014  . Chronic anticoagulation 12/08/2014  . Anemia 04/20/2014  . 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 Cardiomyopathy, ischemic   . HTN (hypertension)   . Carotid arterial disease ([place])   . S/P drug eluting coronary stent placement 05/30/2013  . Chest pain 05/05/2013  . SOB (shortness of breath) 05/05/2013  . CAD (coronary artery disease) 05/05/2013  . Hyperlipidemia 05/05/2013  . History of smoking 05/05/2013  . Cardiac angina ([place]) 05/05/2013     Past Surgical History  Procedure Laterality Date  . Right shoulder    . Carpal tunnel release      right hand  . Tonsillectomy    . Cataract extraction w/phaco Left 09/16/2014    Procedure: CATARACT EXTRACTION PHACO AND INTRAOCULAR LENS PLACEMENT (IOC);  Surgeon: Leandrew Koyanagi, MD;  Location: [place];  Service: Ophthalmology;  Laterality: Left;  . Coronary artery bypass graft  06-26-14  . Cardiac catheterization  05/01/2012  . Cardiac catheterization  04/2013    [facility];x3 stent  . Coronary angioplasty with stent placement  04/13/2014  . Cardiac catheterization  01/11/15     [facility]  . De quervain's release Left 08/22/2012  . Colonoscopy    . Esophagogastroduodenoscopy (egd) with propofol  N/A 04/26/2015    Procedure: ESOPHAGOGASTRODUODENOSCOPY (EGD) WITH PROPOFOL;  Surgeon: Hulen Luster, MD;  Location: [facility] ENDOSCOPY;  Service: Gastroenterology;  Laterality: N/A;     Current Outpatient Rx  Name  Route  Sig  Dispense  Refill  . apixaban (ELIQUIS) 5 MG TABS tablet   Oral   Take 5 mg by mouth 2 (two) times daily.         Marland Kitchen atorvastatin (LIPITOR) 80 MG tablet   Oral   Take 80 mg by mouth at bedtime.         Marland Kitchen HYDROcodone-acetaminophen (NORCO/VICODIN) 5-325 MG tablet   Oral   Take 1 tablet by mouth every 6  (six) hours as needed for moderate pain.         . isosorbide mononitrate (IMDUR) 60 MG 24 hr tablet   Oral   Take 1 tablet by mouth daily.         . metoprolol tartrate (LOPRESSOR) 25 MG tablet      Take 25 mg tablet by mouth am and 12.5 mg tablet by mouth pm daily.   60 tablet   3   . nitroGLYCERIN (NITROSTAT) 0.4 MG SL tablet   Sublingual   Place 1 tablet (0.4 mg total) under the tongue every 5 (five) minutes as needed for chest pain.   25 tablet   6   . zolpidem (AMBIEN) 5 MG tablet   Oral   Take 5 mg by mouth at bedtime as needed for sleep.            Allergies Contrast media ; Oxycodone hcl; and Tramadol   Family History  Problem Relation Age of Onset  . Heart attack Father 82    MI  . Cancer Maternal Uncle     Social History Social History  Substance Use Topics  . Smoking status: Former Smoker -- 1.00 packs/day for 35 years    Types: Cigarettes    Start date: 05/02/2009  . Smokeless tobacco: Never Used  . Alcohol Use: No    Review of Systems  Constitutional:   No fever or chills.  ENT:   No sore throat. No rhinorrhea. Cardiovascular:   Positive as above chest pain. Respiratory:   No dyspnea or cough. Gastrointestinal:   Negative for abdominal pain, vomiting and diarrhea.   10-point ROS otherwise negative.  ____________________________________________   PHYSICAL EXAM:  VITAL SIGNS: ED Triage Vitals  Enc Vitals Group     BP 07/19/15 0116 136/84 mmHg     Pulse Rate 07/19/15 0116 67     Resp 07/19/15 0116 16     Temp 07/19/15 0116 97.7 F (36.5 C)     Temp Source 07/19/15 0116 Oral     SpO2 07/19/15 0116 98 %     Weight 07/19/15 0119 170 lb (77.111 kg)     Height 07/19/15 0119 5\' 10"  (1.778 m)     Head Cir --      Peak Flow --      Pain Score 07/19/15 0119 5     Pain Loc --      Pain Edu? --      Excl. in [place]? --     Vital signs reviewed, nursing assessments reviewed.   Constitutional:   Alert and oriented. Well appearing and  in no distress. Eyes:   No scleral icterus. No conjunctival pallor. PERRL. EOMI.  No nystagmus. ENT   Head:   Normocephalic and atraumatic.   Nose:   No congestion/rhinnorhea. No  septal hematoma   Mouth/Throat:   MMM, no pharyngeal erythema. No peritonsillar mass.    Neck:   No stridor. No SubQ emphysema. No meningismus. Hematological/Lymphatic/Immunilogical:   No cervical lymphadenopathy. Cardiovascular:   RRR. Symmetric bilateral radial and DP pulses.  No murmurs.  Respiratory:   Normal respiratory effort without tachypnea nor retractions. Breath sounds are clear and equal bilaterally. No wheezes/rales/rhonchi. Gastrointestinal:   Soft and nontender. Non distended. There is no CVA tenderness.  No rebound, rigidity, or guarding. Genitourinary:   deferred Musculoskeletal:   Nontender with normal range of motion in all extremities. No joint effusions.  No lower extremity tenderness.  No edema. Anterior chest wall very tender to the charge along the left sternal border around the fourth intercostal space. Palpating along the fourth intercostal space reproduces his symptoms anteriorly. Neurologic:   Normal speech and language.  CN 2-10 normal. Motor grossly intact. No gross focal neurologic deficits are appreciated.  Skin:    Skin is warm, dry and intact. No rash noted.  No petechiae, purpura, or bullae.  ____________________________________________    LABS (pertinent positives/negatives) (all labs ordered are listed, but only abnormal results are displayed) Labs Reviewed  BASIC METABOLIC PANEL - Abnormal; Notable for the following:    Potassium 3.4 (*)    Calcium 8.7 (*)    All other components within normal limits  TROPONIN I  CBC WITH DIFFERENTIAL/PLATELET   ____________________________________________   EKG  Interpreted by me Sinus rhythm rate of 68, left axis, normal intervals. Left bundle branch block. No acute ischemic  changes  ____________________________________________    RADIOLOGY  Chest x-ray unremarkable  ____________________________________________   PROCEDURES   ____________________________________________   INITIAL IMPRESSION / ASSESSMENT AND PLAN / ED COURSE  Pertinent labs & imaging results that were available during my care of the patient were reviewed by me and considered in my medical decision making (see chart for details).  Patient well appearing no acute distress, presents with acute on chronic chest pain, exam highly consistent with musculoskeletal chest wall pain. Follow-up with cardiology primary care. Has a pending referral to pain management which I encouraged him to follow through on. Pain is relieved after 1 dose of Toradol in the ED.     ____________________________________________   FINAL CLINICAL IMPRESSION(S) / ED DIAGNOSES  Final diagnoses:  Chest wall pain       Portions of this note were generated with dragon dictation software. Dictation errors may occur despite best attempts at proofreading.   Carrie Mew, MD 07/19/15 [phone]

## 2015-07-19 NOTE — Telephone Encounter (Signed)
Pt called states he was in the ED last night for CP, and needs an appt. Please advise

## 2015-07-22 NOTE — Telephone Encounter (Signed)
Left pt message to call back

## 2015-07-23 NOTE — Telephone Encounter (Signed)
Pt sched to see Dr. Fletcher Anon on Mon, July 3.

## 2015-07-26 ENCOUNTER — Encounter: Payer: Self-pay | Admitting: Cardiovascular Disease

## 2015-07-26 ENCOUNTER — Ambulatory Visit (INDEPENDENT_AMBULATORY_CARE_PROVIDER_SITE_OTHER): Payer: Medicare HMO | Admitting: Cardiovascular Disease

## 2015-07-26 VITALS — BP 100/60 | HR 54 | Ht 70.0 in | Wt 174.0 lb

## 2015-07-26 DIAGNOSIS — I480 Paroxysmal atrial fibrillation: Secondary | ICD-10-CM

## 2015-07-26 DIAGNOSIS — I10 Essential (primary) hypertension: Secondary | ICD-10-CM

## 2015-07-26 MED ORDER — RAMIPRIL 2.5 MG PO CAPS: 2.5000 mg | ORAL_CAPSULE | Freq: Every day | ORAL | Status: DC

## 2015-07-26 MED ORDER — CARVEDILOL 3.125 MG PO TABS: 3.1250 mg | ORAL_TABLET | Freq: Two times a day (BID) | ORAL | Status: DC

## 2015-07-26 NOTE — Patient Instructions (Addendum)
Medication Instructions:  Your physician has recommended you make the following change in your medication:  STOP taking metoprolol START taking coreg 3.125mg  twice daily START taking ramipril 2.5mg  once daily   Labwork: BMET in one month  Testing/Procedures: none  Follow-Up: Your physician recommends that you schedule a follow-up appointment in: one month with Dr. Rockey Situ.    Any Other Special Instructions Will Be Listed Below (If Applicable).     If you need a refill on your cardiac medications before your next appointment, please call your pharmacy.

## 2015-07-26 NOTE — Progress Notes (Signed)
Cardiology Office Note   Date:  07/26/2015   ID:  Scott [name], DOB [birth year], MRN [id number]  PCP:  Scott Brunner, MD  Cardiologist:  Dr. Rockey [name]  Chief Complaint  Patient presents with  . other    F/u ED due to chest pain. Meds reviewed verbally with pt.      History of Present Illness: Scott [name] is a 75 y.o. male who presents for evaluation of chest pain after a recent emergency room visit. He is a patient of Dr. Rockey [name] with ischemic heart disease and ischemic cardiomyopathy with chronic chest pain. He had previous stenting to the right coronary artery in 2015 with chronically occluded mid left circumflex. He continued to have chest pain and ultimately underwent CABG at [facility] in June 2016 with a LIMA to LAD and SVG to OM. However, he continued to have chest pain after that and had multiple ischemic cardiac workup including a stress test in November 2016 which showed no evidence of ischemia. He underwent cardiac catheterization at [facility] in December 2016 which showed patent grafts and RCA stents with no new disease. The patient was treated for presumed small vessel disease but continued to have intermittent episodes of chest pain. He describes severe substernal chest pain which is very unpredictable and can happen at rest or with physical activities. It's not related to food. He has no heartburn. Sometimes it improved with nitroglycerin and others it does not respond. He has no associated abdominal pain. He had one prolonged episode last Sunday and went to the emergency room. His basic workup was negative. He did have a treadmill nuclear stress test in May of this year which showed no evidence of ischemia. However, his ejection fraction was noted to be low which was thought to be due to an artifact. Most recent echocardiogram in January 2017 showed an ejection fraction of 40-45% with mildly dilated left atrium. Other medical issues include paroxysmal atrial fibrillation and ischemic  cardiomyopathy.   Past Medical History  Diagnosis Date  . Cardiomyopathy, ischemic     a. 04/2012 Echo: EF 40-45%;  b. 08/2013 Echo: EF 45-50%, mild glob HK, mod lat/post HK, diast dysfxn, mildly dil LA, mild MR, nl RVSP; c. 01/2015 Echo: EF 40-45%, Gr 1 DD, mildly dil LA.  Marland Kitchen Hyperlipidemia   . Carotid arterial disease ([place])     a. 05/2013 Carotid U/S; bilat 40-50% ICA stenosis.  Marland Kitchen CAD (coronary artery disease)     a. 04/2012 Cath: 3VD->Med Rx;  b. 04/2013 Cath/PCI: RCA 70p, 92m, 95d. Mid & distal RCA treated with 2 DES; c. 03/2014 PCI: LAD 80 (3.0x23 Xience Alpine DES), 30 ISR in RCA; d. 06/2014 CABG x 2 ([facility]) LIMA->LAD, VG->OM; e. 11/2014 Neg MV; f. 12/2014 Cath ([facility]): patent grafts->Med Rx.  . Chronic Chest Pain   . Chronic systolic CHF (congestive heart failure) ([place])     a. 01/2015 Echo: EF 40-45%.    Past Surgical History  Procedure Laterality Date  . Right shoulder    . Carpal tunnel release      right hand  . Tonsillectomy    . Cataract extraction w/phaco Left 09/16/2014    Procedure: CATARACT EXTRACTION PHACO AND INTRAOCULAR LENS PLACEMENT (IOC);  Surgeon: Scott Koyanagi, MD;  Location: [place];  Service: Ophthalmology;  Laterality: Left;  . Coronary artery bypass graft  06-26-14  . Cardiac catheterization  05/01/2012  . Cardiac catheterization  04/2013    [facility];x3 stent  . Coronary angioplasty  with stent placement  04/13/2014  . Cardiac catheterization  01/11/15     [facility]  . De quervain's release Left 08/22/2012  . Colonoscopy    . Esophagogastroduodenoscopy (egd) with propofol N/A 04/26/2015    Procedure: ESOPHAGOGASTRODUODENOSCOPY (EGD) WITH PROPOFOL;  Surgeon: Hulen Luster, MD;  Location: [facility] ENDOSCOPY;  Service: Gastroenterology;  Laterality: N/A;     Current Outpatient Prescriptions  Medication Sig Dispense Refill  . apixaban (ELIQUIS) 5 MG TABS tablet Take 5 mg by mouth 2 (two) times daily.    Marland Kitchen atorvastatin (LIPITOR) 80 MG tablet Take 80 mg by mouth at bedtime.     Marland Kitchen HYDROcodone-acetaminophen (NORCO/VICODIN) 5-325 MG tablet Take 1 tablet by mouth every 6 (six) hours as needed for moderate pain.    . isosorbide mononitrate (IMDUR) 60 MG 24 hr tablet Take 1 tablet by mouth daily.    . nitroGLYCERIN (NITROSTAT) 0.4 MG SL tablet Place 1 tablet (0.4 mg total) under the tongue every 5 (five) minutes as needed for chest pain. 25 tablet 6  . zolpidem (AMBIEN) 5 MG tablet Take 5 mg by mouth at bedtime as needed for sleep.    . carvedilol (COREG) 3.125 MG tablet Take 1 tablet (3.125 mg total) by mouth 2 (two) times daily. 60 tablet 3  . ramipril (ALTACE) 2.5 MG capsule Take 1 capsule (2.5 mg total) by mouth daily. 30 capsule 3   No current facility-administered medications for this visit.    Allergies:   Contrast media ; Oxycodone hcl; and Tramadol    Social History:  The patient  reports that he has quit smoking. His smoking use included Cigarettes. He started smoking about 6 years ago. He has a 35 pack-year smoking history. He has never used smokeless tobacco. He reports that he does not drink alcohol or use illicit drugs.   Family History:  The patient's family history includes Cancer in his maternal uncle; Heart attack (age of onset: 35) in his father.    ROS:  Please see the history of present illness.   Otherwise, review of systems are positive for none.   All other systems are reviewed and negative.    PHYSICAL EXAM: VS:  BP 100/60 mmHg  Pulse 54  Ht 5\' 10"  (1.778 m)  Wt 174 lb (78.926 kg)  BMI 24.97 kg/m2 , BMI Body mass index is 24.97 kg/(m^2). GEN: Well nourished, well developed, in no acute distress HEENT: normal Neck: no JVD, carotid bruits, or masses Cardiac: RRR; no murmurs, rubs, or gallops,no edema  Respiratory:  clear to auscultation bilaterally, normal work of breathing GI: soft, nontender, nondistended, + BS MS: no deformity or atrophy Skin: warm and dry, no rash Neuro:  Strength and sensation are intact Psych: euthymic mood,  full affect   EKG:  EKG is ordered today. The ekg ordered today demonstrates sinus bradycardia with nonspecific IVCD.   Recent Labs: 07/19/2015: BUN 16; Creatinine, Ser 1.10; Hemoglobin 14.4; Platelets 170; Potassium 3.4*; Sodium 139    Lipid Panel    Component Value Date/Time   CHOL 122 12/15/2013 0748   TRIG 76 12/15/2013 0748   HDL 51 12/15/2013 0748   VLDL 15 12/15/2013 0748   LDLCALC 56 12/15/2013 0748      Wt Readings from Last 3 Encounters:  07/26/15 174 lb (78.926 kg)  07/19/15 170 lb (77.111 kg)  06/02/15 175 lb (79.379 kg)       ASSESSMENT AND PLAN:  1.  Coronary artery disease involving native coronary arteries with other forms  of angina: The patient continues to have chronic chest pain which can happen at rest or with activities. The history is very unpredictable and not really consistent with stable angina. He has no symptoms suggestive of GERD. He does have reproducible pain at the sternal and he reports that this is usually the location of the chest pain when it happens. There is a possibility of a musculoskeletal component related to previous scarring from CABG. Chest x-ray showed no evidence of fractures. Given his negative ischemic workup recently, cardiac catheterization should be left as a last resort.  2. Chronic systolic heart failure with ischemic cardiomyopathy. I reviewed his most recent echocardiogram which probably showed an EF of 30-35%. I elected to switch metoprolol to small dose carvedilol and added small dose ramipril. Check basic metabolic profile with follow-up.  3. Hyperlipidemia: Continue treatment with atorvastatin with a target LDL of less than 70.  4. Paroxysmal atrial fibrillation: Containing in sinus rhythm. Continue anticoagulation.    Disposition:   FU with Dr. Rockey [name] in 1 month to re-evaluate symptoms and see if there is a need for cardiac cath.    Signed,  Kathlyn Sacramento, MD  07/26/2015 11:19 AM    [place]

## 2015-08-05 ENCOUNTER — Telehealth: Payer: Self-pay | Admitting: Cardiovascular Disease

## 2015-08-05 NOTE — Telephone Encounter (Signed)
Patient states that he had a message on his phone but I do not see a note from any recent calls. He recently saw Dr. Fletcher Anon here in the office and has a follow up with Dr. Rockey Situ in one month. He verbalized understanding and had no further questions at this time.

## 2015-08-10 ENCOUNTER — Ambulatory Visit
Admission: RE | Admit: 2015-08-10 | Discharge: 2015-08-10 | Disposition: A | Discharge status: Home / Self Care | Payer: Medicare HMO | Source: Ambulatory Visit | Attending: Specialist | Admitting: Specialist

## 2015-08-10 DIAGNOSIS — Z951 Presence of aortocoronary bypass graft: Secondary | ICD-10-CM | POA: Diagnosis not present

## 2015-08-10 DIAGNOSIS — R0602 Shortness of breath: Secondary | ICD-10-CM | POA: Diagnosis not present

## 2015-08-10 DIAGNOSIS — J439 Emphysema, unspecified: Secondary | ICD-10-CM | POA: Insufficient documentation

## 2015-08-10 DIAGNOSIS — I700 Atherosclerosis of aorta: Secondary | ICD-10-CM | POA: Insufficient documentation

## 2015-08-10 DIAGNOSIS — R918 Other nonspecific abnormal finding of lung field: Secondary | ICD-10-CM

## 2015-08-10 DIAGNOSIS — J8489 Other specified interstitial pulmonary diseases: Secondary | ICD-10-CM

## 2015-08-19 ENCOUNTER — Institutional Professional Consult (permissible substitution): Payer: Medicare HMO | Admitting: Cardiovascular Disease

## 2015-08-19 DIAGNOSIS — R05 Cough: Secondary | ICD-10-CM | POA: Diagnosis not present

## 2015-08-19 DIAGNOSIS — I5032 Chronic diastolic (congestive) heart failure: Secondary | ICD-10-CM | POA: Diagnosis not present

## 2015-08-24 NOTE — Telephone Encounter (Signed)
Received fax from [facility] that pt was approved and appt has been sched for 09/30/15 @ 2:15 w/ Dr. Hardin Negus.

## 2015-08-26 ENCOUNTER — Other Ambulatory Visit: Payer: Self-pay | Admitting: *Deleted

## 2015-08-26 MED ORDER — RAMIPRIL 2.5 MG PO CAPS: 2.5000 mg | ORAL_CAPSULE | Freq: Every day | ORAL | 3 refills | Status: DC

## 2015-09-03 ENCOUNTER — Other Ambulatory Visit (INDEPENDENT_AMBULATORY_CARE_PROVIDER_SITE_OTHER): Payer: Medicare HMO

## 2015-09-03 DIAGNOSIS — I10 Essential (primary) hypertension: Secondary | ICD-10-CM

## 2015-09-04 LAB — BASIC METABOLIC PANEL
BUN / CREAT RATIO: 16 (ref 10–24)
BUN: 19 mg/dL (ref 8–27)
CHLORIDE: 102 mmol/L (ref 96–106)
CO2: 23 mmol/L (ref 18–29)
CREATININE: 1.21 mg/dL (ref 0.76–1.27)
Calcium: 8.9 mg/dL (ref 8.6–10.2)
GFR calc non Af Amer: 59 mL/min/{1.73_m2} — ABNORMAL LOW (ref >59)
GFR, EST AFRICAN AMERICAN: 68 mL/min/{1.73_m2} (ref >59)
GLUCOSE: 95 mg/dL (ref 65–99)
Potassium: 5 mmol/L (ref 3.5–5.2)
SODIUM: 139 mmol/L (ref 134–144)

## 2015-09-07 ENCOUNTER — Telehealth: Payer: Self-pay | Admitting: Cardiovascular Disease

## 2015-09-07 NOTE — Telephone Encounter (Signed)
Pt states he went to pharmacy to get his Nitro refilled, and he told the pharmacist he was taking this daily. The pharmacist advised he call us. States he takes at least 4 a day. Pt has appt on 8/21

## 2015-09-07 NOTE — Telephone Encounter (Signed)
This issue is chronic for pt. He has been set up w/ pain clinic in [facility] and has appt w/ Dr. Rockey Situ on 8/21 to discuss possible cath.

## 2015-09-08 ENCOUNTER — Telehealth: Payer: Self-pay | Admitting: Cardiovascular Disease

## 2015-09-08 NOTE — Telephone Encounter (Signed)
S/w pt who reports he has been taking 3-4 nitro most days since July . He picked up a refill yesterday and his pharmacist suggested he call to let us know he is needing more to relieve chest pain. He rates pain usually 7/10 accompanied by SOB. It is relieved after taking nitro x 2 and rest.  He feels like "something is crawling up and down my neck and arm" however he thinks this may be r/t a nerve issue.   Pt has h/o CAD and chronic chest pain. He was seen in the ER in June and July OV w/Dr. Fletcher Anon.  Per Dr. Tyrell Antonio notes:  "The patient continues to have chronic chest pain which can happen at rest or with activities. The history is very unpredictable and not really consistent with stable angina. He has no symptoms suggestive of GERD. He does have reproducible pain at the sternal and he reports that this is usually the location of the chest pain when it happens. There is a possibility of a musculoskeletal component related to previous scarring from CABG. Chest x-ray showed no evidence of fractures. Given his negative ischemic workup recently, cardiac catheterization should be left as a last resort."  Advised pt to keep 8/21 OV w/Dr. Rockey Situ, continue to monitor and if sx worsen or become more frequent before the OV, he should go to ER for further evaluation. He verbalized understanding and is agreeable w/plan. Will forward to Dr. Rockey Situ to make aware.

## 2015-09-13 ENCOUNTER — Ambulatory Visit (INDEPENDENT_AMBULATORY_CARE_PROVIDER_SITE_OTHER): Payer: Medicare HMO | Admitting: Cardiovascular Disease

## 2015-09-13 ENCOUNTER — Encounter: Payer: Self-pay | Admitting: Cardiovascular Disease

## 2015-09-13 VITALS — BP 128/80 | HR 56 | Ht 70.0 in | Wt 176.2 lb

## 2015-09-13 DIAGNOSIS — I2510 Atherosclerotic heart disease of native coronary artery without angina pectoris: Secondary | ICD-10-CM

## 2015-09-13 DIAGNOSIS — I25701 Atherosclerosis of coronary artery bypass graft(s), unspecified, with angina pectoris with documented spasm: Secondary | ICD-10-CM

## 2015-09-13 DIAGNOSIS — R079 Chest pain, unspecified: Secondary | ICD-10-CM | POA: Diagnosis not present

## 2015-09-13 DIAGNOSIS — I480 Paroxysmal atrial fibrillation: Secondary | ICD-10-CM | POA: Diagnosis not present

## 2015-09-13 DIAGNOSIS — I200 Unstable angina: Secondary | ICD-10-CM

## 2015-09-13 DIAGNOSIS — E785 Hyperlipidemia, unspecified: Secondary | ICD-10-CM

## 2015-09-13 NOTE — Patient Instructions (Addendum)
Medication Instructions:  No medication changes  Labwork: Pre-cath labs  Testing/Procedures: We will schedule cardiac cath this week  Follow-Up: It was a pleasure seeing you in the office today. Please call us if you have new issues that need to be addressed before your next appt.  [phone]  Your physician wants you to follow-up in: 1 month.   If you need a refill on your cardiac medications before your next appointment, please call your pharmacy.   [facility] Cardiac Cath Instructions   You are scheduled for a Cardiac Cath on:__Thursday, Aug 24____  Please arrive at 8:30 am on the day of your procedure  Please expect a call from our [place] to pre-register you  Do not eat/drink anything after midnight  Someone will need to drive you home  It is recommended someone be with you for the first 24 hours after your procedure  Wear clothes that are easy to get on/off and wear slip on shoes if possible   Medications bring a current list of all medications with you  _X_ Do not take Eliquis for 2 days prior to procedure  Day of your procedure: Arrive at the [facility] entrance.  Free valet service is available.  After entering the [facility] please check-in at the registration desk (1st desk on your right) to receive your armband. After receiving your armband someone will escort you to the cardiac cath/special procedures waiting area.  The usual length of stay after your procedure is about 2 to 3 hours.  This can vary.  If you have any questions, please call our office at [phone], or you may call the cardiac cath lab at [facility] directly at [phone]   Angiogram An angiogram, also called angiography, is a procedure used to look at the blood vessels. In this procedure, dye is injected through a long, thin tube (catheter) into an artery. X-rays are then taken. The X-rays will show if there is a blockage or problem in a blood vessel.  LET [facility] CARE PROVIDER KNOW ABOUT:  Any allergies you have, including allergies to shellfish or contrast dye.   All medicines you are taking, including vitamins, herbs, eye drops, creams, and over-the-counter medicines.   Previous problems you or members of your family have had with the use of anesthetics.   Any blood disorders you have.   Previous surgeries you have had.  Any previous kidney problems or failure you have had.  Medical conditions you have.   Possibility of pregnancy, if this applies. RISKS AND COMPLICATIONS Generally, an angiogram is a safe procedure. However, as with any procedure, problems can occur. Possible problems include:  Injury to the blood vessels, including rupture or bleeding.  Infection or bruising at the catheter site.  Allergic reaction to the dye or contrast used.  Kidney damage from the dye or contrast used.  Blood clots that can lead to a stroke or heart attack. BEFORE THE PROCEDURE  Do not eat or drink after midnight on the night before the procedure, or as directed by your health care provider.   Ask your health care provider if you may drink enough water to take any needed medicines the morning of the procedure.  PROCEDURE  You may be given a medicine to help you relax (sedative) before and during the procedure. This medicine is given through an IV access tube that is inserted into one of your veins.   The area where the catheter will be inserted will be washed  and shaved. This is usually done in the groin but may be done in the fold of your arm (near your elbow) or in the wrist.  A medicine will be given to numb the area where the catheter will be inserted (local anesthetic).  The catheter will be inserted with a guide wire into an artery. The catheter is guided by using a type of X-ray (fluoroscopy) to the blood vessel being examined.   Dye is then injected into the catheter, and X-rays are taken. The dye helps to show where  any narrowing or blockages are located.  AFTER THE PROCEDURE   If the procedure is done through the leg, you will be kept in bed lying flat for several hours. You will be instructed to not bend or cross your legs.  The insertion site will be checked frequently.  The pulse in your feet or wrist will be checked frequently.  Additional blood tests, X-rays, and electrocardiography may be done.   You may need to stay in the hospital overnight for observation.    This information is not intended to replace advice given to you by your health care provider. Make sure you discuss any questions you have with your health care provider.   Document Released: 10/19/2004 Document Revised: 01/30/2014 Document Reviewed: 06/12/2012 Elsevier Interactive Patient Education 2016 [place] After Refer to this sheet in the next few weeks. These instructions provide you with information about caring for yourself after your procedure. Your health care provider may also give you more specific instructions. Your treatment has been planned according to current medical practices, but problems sometimes occur. Call your health care provider if you have any problems or questions after your procedure. WHAT TO EXPECT AFTER THE PROCEDURE After your procedure, it is typical to have the following:  Bruising at the catheter insertion site that usually fades within 1-2 weeks.  Blood collecting in the tissue (hematoma) that may be painful to the touch. It should usually decrease in size and tenderness within 1-2 weeks. HOME CARE INSTRUCTIONS  Take medicines only as directed by your health care provider.  You may shower 24-48 hours after the procedure or as directed by your health care provider. Remove the bandage (dressing) and gently wash the site with plain soap and water. Pat the area dry with a clean towel. Do not rub the site, because this may cause bleeding.  Do not take baths, swim, or use a  hot tub until your health care provider approves.  Check your insertion site every day for redness, swelling, or drainage.  Do not apply powder or lotion to the site.  Do not lift over 10 lb (4.5 kg) for 5 days after your procedure or as directed by your health care provider.  Ask your health care provider when it is okay to:  Return to work or school.  Resume usual physical activities or sports.  Resume sexual activity.  Do not drive home if you are discharged the same day as the procedure. Have someone else drive you.  You may drive 24 hours after the procedure unless otherwise instructed by your health care provider.  Do not operate machinery or power tools for 24 hours after the procedure or as directed by your health care provider.  If your procedure was done as an outpatient procedure, which means that you went home the same day as your procedure, a responsible adult should be with you for the first 24 hours after you arrive home.  Keep all follow-up visits as directed by your health care provider. This is important. SEEK MEDICAL CARE IF:  You have a fever.  You have chills.  You have increased bleeding from the catheter insertion site. Hold pressure on the site. SEEK IMMEDIATE MEDICAL CARE IF:  You have unusual pain at the catheter insertion site.  You have redness, warmth, or swelling at the catheter insertion site.  You have drainage (other than a small amount of blood on the dressing) from the catheter insertion site.  The catheter insertion site is bleeding, and the bleeding does not stop after 30 minutes of holding steady pressure on the site.  The area near or just beyond the catheter insertion site becomes pale, cool, tingly, or numb.   This information is not intended to replace advice given to you by your health care provider. Make sure you discuss any questions you have with your health care provider.   Document Released: 07/28/2004 Document Revised:  01/30/2014 Document Reviewed: 06/12/2012 Elsevier Interactive Patient Education [employer].

## 2015-09-13 NOTE — Progress Notes (Signed)
Cardiology Office Note  Date:  09/13/2015   ID:  NIC LERNER, DOB 05-May-1940, MRN [id number]  PCP:  Madelyn Brunner, MD   Chief Complaint  Patient presents with  . Other    1 month f/u c/o chest pain and sob. Meds reviewed verbally with pt.    HPI:  Mr. Perel is a 75 year old gentleman with history of CAD, CABG 2 with LIMA to the LAD, vein graft to the OM June 2016 at [facility], chronic chest pain not relieved with bypass surgery, who presents for follow-up of his chronic angina pectoralis  Cardiac catheterization April 2014 Cardiac catheterization April 2015 with stent to the RCA Cardiac catheterization with stent placement March 2016, DES to the mid LAD Notes indicating bypass surgery June 2016 at [facility] test in November 2016 for chronic chest pain showing no ischemia He had follow-up cardiac catheterization December 2016 at [facility] Through this time his chest pain has not improved, last cardiac catheterization by report with patent stents  Since then he has been in the hospital with chronic chest pain, cardiac enzymes negative, no workup done at that time given recent catheterization  Chest x-ray showed no evidence of fractures. EF 40 to 45%  02/08/2015  In follow-up today, he reports having escalating anginal symptoms Last week called in, reported taking 4 nitroglycerin for chest pain symptoms Now presenting with exertion Very concerned about new blockages. "Cannot go on like this" Wife presents with him today, did not add much to his story. Previously he was able to exert himself without chest pain, now seems to happen much more on exertion  He continues to take long-acting isosorbide Occasional lightheadedness No regular exercise program Previously referred to the pain clinic. Was declined appointment in [place] does have appointment in [place] early next week. We've previously prescribed tramadol as he reports having allergy to  Vicodin and oxycodone. Makes him itch. Does not think he try the tramadol Previous notes indicating he does not like to take pain medication Previously would delay when he would take nitroglycerin in the setting of discomfort. Now taking nitroglycerin more regularly  chest discomfort despite titrating doses of isosorbide and ranexa. 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 He was initially medically managed.   In April 2015, he underwent repeat catheterization secondary to ongoing chest pain, showing stable moderate to severe disease. He then underwent successful RCA stenting.   In March 2016, due to recurrent chest pain, he underwent successful stenting of the LAD.    he had recurrent chest pain again in June 2016 and was seen at [facility].  He underwent CABG 2 with a LIMA to the LAD and a vein graft to the obtuse marginal.   following CABG, he continued to have persistent/chronic low-level left-sided and midsternal chest pain.  He had repeat hospitalizations  with stress testing in November at [facility], which  was negative,  catheterization at [facility] in December 2016, which apparently showed patent grafts.  Lab work in the hospital  showing total cholesterol 142, LDL 77, HDL 47  Echocardiogram 03/02/2014 showing normal LV function, essentially normal study  Echocardiogram  04/22/2012 showing ejection fraction 40%, no mention of wall motion abnormality, mild MR, TR  PMH:   has a past medical history of CAD (coronary artery disease); Cardiomyopathy, ischemic; Carotid arterial disease ([place]); Chronic Chest Pain; Chronic systolic CHF (congestive heart failure) ([place]); and Hyperlipidemia.  PSH:    Past Surgical History:  Procedure Laterality Date  . CARDIAC CATHETERIZATION  05/01/2012  . CARDIAC CATHETERIZATION  04/2013   [facility];x3 stent  . CARDIAC CATHETERIZATION  01/11/15    [facility]  . CARPAL TUNNEL RELEASE     right hand  . CATARACT EXTRACTION W/PHACO Left 09/16/2014   Procedure: CATARACT EXTRACTION PHACO AND INTRAOCULAR LENS PLACEMENT (IOC);  Surgeon: Leandrew Koyanagi, MD;  Location: [facility];  Service: Ophthalmology;  Laterality: Left;  . COLONOSCOPY    . CORONARY ANGIOPLASTY WITH STENT PLACEMENT  04/13/2014  . CORONARY ARTERY BYPASS GRAFT  06-26-14  . DE QUERVAIN'S RELEASE Left 08/22/2012  . ESOPHAGOGASTRODUODENOSCOPY (EGD) WITH PROPOFOL N/A 04/26/2015   Procedure: ESOPHAGOGASTRODUODENOSCOPY (EGD) WITH PROPOFOL;  Surgeon: Hulen Luster, MD;  Location: [facility] ENDOSCOPY;  Service: Gastroenterology;  Laterality: N/A;  . right shoulder    . TONSILLECTOMY      Current Outpatient Prescriptions  Medication Sig Dispense Refill  . apixaban (ELIQUIS) 5 MG TABS tablet Take 5 mg by mouth 2 (two) times daily.    Marland Kitchen atorvastatin (LIPITOR) 80 MG tablet Take 80 mg by mouth at bedtime.    . isosorbide mononitrate (IMDUR) 60 MG 24 hr tablet Take 1 tablet by mouth daily.    . nitroGLYCERIN (NITROSTAT) 0.4 MG SL tablet Place 1 tablet (0.4 mg total) under the tongue every 5 (five) minutes as needed for chest pain. 25 tablet 6  . ramipril (ALTACE) 2.5 MG capsule Take 1 capsule (2.5 mg total) by mouth daily. 90 capsule 3  . zolpidem (AMBIEN) 5 MG tablet Take 5 mg by mouth at bedtime as needed for sleep.     No current facility-administered medications for this visit.       Allergies:   Contrast media  [iodinated diagnostic agents]; Oxycodone hcl; and Tramadol   Social History:  The patient  reports that he has quit smoking. His smoking use included Cigarettes. He started smoking about 6 years ago. He has a 35.00 pack-year smoking history. He has never used smokeless tobacco. He reports that he does not drink alcohol or use drugs.   Family History:   family history includes Cancer in his maternal uncle; Heart attack (age of onset: 75) in his father.    Review of Systems: Review of Systems  Constitutional: Positive for malaise/fatigue.  Respiratory: Negative.   Cardiovascular: Positive for chest pain.  Gastrointestinal: Negative.   Musculoskeletal: Negative.   Neurological: Positive for dizziness.  Psychiatric/Behavioral: Negative.   All other systems reviewed and are negative.    PHYSICAL EXAM: VS:  BP 128/80 (BP Location: Left Arm, Patient Position: Sitting, Cuff Size: Normal)   Pulse (!) 56   Ht 5\' 10"  (1.778 m)   Wt 176 lb 4 oz (79.9 kg)   BMI 25.29 kg/m  , BMI Body mass index is 25.29 kg/m. GEN: Well nourished, well developed, in no acute distress  HEENT: normal  Neck: no JVD, carotid bruits, or masses Cardiac: RRR;  no murmurs, rubs, or gallops,no edema  Respiratory:  clear to auscultation bilaterally, normal work of breathing GI: soft, nontender, nondistended, + BS MS: no deformity or atrophy  Skin: warm and dry, no rash Neuro:  Strength and sensation are intact Psych: euthymic mood, full affect    Recent Labs: 07/19/2015: Hemoglobin 14.4; Platelets 170 09/03/2015: BUN 19; Creatinine, Ser 1.21; Potassium 5.0; Sodium 139    Lipid Panel Lab Results  Component Value Date   CHOL 122 12/15/2013   HDL 51 12/15/2013   LDLCALC 56 12/15/2013   TRIG 76 12/15/2013      Wt Readings from Last 3 Encounters:  09/13/15 176 lb 4 oz (79.9 kg)  07/26/15 174 lb (78.9 kg)  07/19/15 170 lb (77.1 kg)       ASSESSMENT AND PLAN:  PAF  (paroxysmal atrial fibrillation) ([place]) - Plan: EKG [id number], Basic Metabolic Panel (BMET), CBC with Differential/Platelet, INR/PT Maintaining normal sinus rhythm, no changes made to his medications We will stop anticoagulation in preparation for cardiac catheterization this week  Chest pain, unspecified chest pain type - Plan: Basic Metabolic Panel (BMET), CBC with Differential/Platelet, INR/PT  Coronary artery disease involving native coronary artery of native heart without angina pectoris Known disease as detailed below. Cardiac catheterization scheduled for unstable angina symptoms  Hyperlipidemia Cholesterol is at goal on the current lipid regimen. No changes to the medications were made.  Unstable angina (HCC) Worsening chest pain, requiring increasing doses of nitroglycerin consistent with unstable angina. Long history of chest pain though worse recently. Patient was adamant that symptoms could not continue unexplained. Cardiac catheterization scheduled for Thursday, August 24 third case We have recommended he keep his appointment with the pain clinic August 29 I have reviewed the risks, indications, and alternatives to cardiac catheterization, possible angioplasty, and stenting with the patient. Risks include but are not limited to bleeding, infection, vascular injury, stroke, myocardial infection, arrhythmia, kidney injury, radiation-related injury in the case of prolonged fluoroscopy use, emergency cardiac surgery, and death. The patient understands the risks of serious complication is 1-2 in [id number] with diagnostic cardiac cath and 1-2% or less with angioplasty/stenting.    Coronary artery disease involving coronary bypass graft of native heart with angina pectoris with documented spasm (HCC) Known stent to the LAD, RCA with bypass graft to the LAD and OM vessel  Dizziness Etiology unclear, orthostatics normal No further workup at this time Denies vasovagal symptoms, though seem to  happen after blood draw    Total encounter time more than 40 minutes  Greater than 50% was spent in counseling and coordination of care with the patient   Disposition:   F/U  1 month   Orders Placed This Encounter  Procedures  . Basic Metabolic Panel (BMET)  . CBC with Differential/Platelet  . INR/PT  . EKG 12-Lead     Signed, Esmond Plants, M.D., Ph.D. 09/13/2015  [place], [place]

## 2015-09-14 LAB — CBC WITH DIFFERENTIAL/PLATELET
BASOS: 0 %
Basophils Absolute: 0 10*3/uL (ref 0.0–0.2)
EOS (ABSOLUTE): 0.1 10*3/uL (ref 0.0–0.4)
Eos: 1 %
Hematocrit: 44 % (ref 37.5–51.0)
Hemoglobin: 14.6 g/dL (ref 12.6–17.7)
IMMATURE GRANS (ABS): 0 10*3/uL (ref 0.0–0.1)
Immature Granulocytes: 0 %
LYMPHS ABS: 2 10*3/uL (ref 0.7–3.1)
LYMPHS: 37 %
MCH: 29.1 pg (ref 26.6–33.0)
MCHC: 33.2 g/dL (ref 31.5–35.7)
MCV: 88 fL (ref 79–97)
MONOS ABS: 0.5 10*3/uL (ref 0.1–0.9)
Monocytes: 9 %
NEUTROS ABS: 2.9 10*3/uL (ref 1.4–7.0)
Neutrophils: 53 %
PLATELETS: 203 10*3/uL (ref 150–379)
RBC: 5.02 x10E6/uL (ref 4.14–5.80)
RDW: 14.8 % (ref 12.3–15.4)
WBC: 5.5 10*3/uL (ref 3.4–10.8)

## 2015-09-14 LAB — PROTIME-INR
INR: 1.1 (ref 0.8–1.2)
Prothrombin Time: 11.8 s (ref 9.1–12.0)

## 2015-09-14 LAB — BASIC METABOLIC PANEL
BUN / CREAT RATIO: 14 (ref 10–24)
BUN: 16 mg/dL (ref 8–27)
CO2: 23 mmol/L (ref 18–29)
CREATININE: 1.13 mg/dL (ref 0.76–1.27)
Calcium: 9.2 mg/dL (ref 8.6–10.2)
Chloride: 102 mmol/L (ref 96–106)
GFR calc non Af Amer: 64 mL/min/{1.73_m2} (ref >59)
GFR, EST AFRICAN AMERICAN: 74 mL/min/{1.73_m2} (ref >59)
GLUCOSE: 89 mg/dL (ref 65–99)
Potassium: 5.1 mmol/L (ref 3.5–5.2)
SODIUM: 141 mmol/L (ref 134–144)

## 2015-09-15 ENCOUNTER — Other Ambulatory Visit: Payer: Self-pay | Admitting: Cardiovascular Disease

## 2015-09-15 DIAGNOSIS — I200 Unstable angina: Secondary | ICD-10-CM

## 2015-09-16 ENCOUNTER — Encounter
Admission: RE | Disposition: A | Discharge status: Home / Self Care | Payer: Self-pay | Source: Ambulatory Visit | Attending: Cardiovascular Disease

## 2015-09-16 ENCOUNTER — Ambulatory Visit
Admission: RE | Admit: 2015-09-16 | Discharge: 2015-09-16 | Disposition: A | Discharge status: Home / Self Care | Payer: Medicare HMO | Source: Ambulatory Visit | Attending: Cardiovascular Disease | Admitting: Cardiovascular Disease

## 2015-09-16 ENCOUNTER — Encounter: Payer: Self-pay | Admitting: *Deleted

## 2015-09-16 DIAGNOSIS — Z885 Allergy status to narcotic agent status: Secondary | ICD-10-CM | POA: Insufficient documentation

## 2015-09-16 DIAGNOSIS — Z951 Presence of aortocoronary bypass graft: Secondary | ICD-10-CM | POA: Diagnosis not present

## 2015-09-16 DIAGNOSIS — E785 Hyperlipidemia, unspecified: Secondary | ICD-10-CM | POA: Insufficient documentation

## 2015-09-16 DIAGNOSIS — I7789 Other specified disorders of arteries and arterioles: Secondary | ICD-10-CM | POA: Diagnosis not present

## 2015-09-16 DIAGNOSIS — I25110 Atherosclerotic heart disease of native coronary artery with unstable angina pectoris: Secondary | ICD-10-CM | POA: Insufficient documentation

## 2015-09-16 DIAGNOSIS — Z7901 Long term (current) use of anticoagulants: Secondary | ICD-10-CM | POA: Diagnosis not present

## 2015-09-16 DIAGNOSIS — R079 Chest pain, unspecified: Secondary | ICD-10-CM | POA: Diagnosis present

## 2015-09-16 DIAGNOSIS — Z87891 Personal history of nicotine dependence: Secondary | ICD-10-CM | POA: Diagnosis not present

## 2015-09-16 DIAGNOSIS — I255 Ischemic cardiomyopathy: Secondary | ICD-10-CM | POA: Insufficient documentation

## 2015-09-16 DIAGNOSIS — G8929 Other chronic pain: Secondary | ICD-10-CM | POA: Diagnosis not present

## 2015-09-16 DIAGNOSIS — Z888 Allergy status to other drugs, medicaments and biological substances status: Secondary | ICD-10-CM | POA: Insufficient documentation

## 2015-09-16 DIAGNOSIS — Z91041 Radiographic dye allergy status: Secondary | ICD-10-CM | POA: Insufficient documentation

## 2015-09-16 DIAGNOSIS — I2510 Atherosclerotic heart disease of native coronary artery without angina pectoris: Secondary | ICD-10-CM

## 2015-09-16 DIAGNOSIS — Z8249 Family history of ischemic heart disease and other diseases of the circulatory system: Secondary | ICD-10-CM | POA: Diagnosis not present

## 2015-09-16 DIAGNOSIS — Z79899 Other long term (current) drug therapy: Secondary | ICD-10-CM | POA: Diagnosis not present

## 2015-09-16 DIAGNOSIS — Z809 Family history of malignant neoplasm, unspecified: Secondary | ICD-10-CM | POA: Diagnosis not present

## 2015-09-16 DIAGNOSIS — Z9842 Cataract extraction status, left eye: Secondary | ICD-10-CM | POA: Insufficient documentation

## 2015-09-16 DIAGNOSIS — I5022 Chronic systolic (congestive) heart failure: Secondary | ICD-10-CM | POA: Diagnosis not present

## 2015-09-16 DIAGNOSIS — I209 Angina pectoris, unspecified: Secondary | ICD-10-CM | POA: Diagnosis present

## 2015-09-16 DIAGNOSIS — I25111 Atherosclerotic heart disease of native coronary artery with angina pectoris with documented spasm: Secondary | ICD-10-CM

## 2015-09-16 DIAGNOSIS — I200 Unstable angina: Secondary | ICD-10-CM

## 2015-09-16 HISTORY — PX: CARDIAC CATHETERIZATION: SHX172

## 2015-09-16 SURGERY — LEFT HEART CATH AND CORS/GRAFTS ANGIOGRAPHY
Anesthesia: Moderate Sedation

## 2015-09-16 MED ORDER — METHYLPREDNISOLONE SODIUM SUCC 125 MG IJ SOLR
125.0000 mg | Freq: Once | INTRAMUSCULAR | Status: AC
  Administered 2015-09-16: 125 mg via INTRAVENOUS

## 2015-09-16 MED ORDER — METHYLPREDNISOLONE SODIUM SUCC 125 MG IJ SOLR
INTRAMUSCULAR | Status: AC
  Administered 2015-09-16: 09:00:00
  Filled 2015-09-16: qty 2

## 2015-09-16 MED ORDER — MIDAZOLAM HCL 2 MG/2ML IJ SOLN
INTRAMUSCULAR | Status: DC | PRN
  Administered 2015-09-16: 1 mg via INTRAVENOUS
  Administered 2015-09-16: 0.5 mg via INTRAVENOUS

## 2015-09-16 MED ORDER — IOPAMIDOL (ISOVUE-300) INJECTION 61%
INTRAVENOUS | Status: DC | PRN
  Administered 2015-09-16: 150 mL via INTRA_ARTERIAL

## 2015-09-16 MED ORDER — MIDAZOLAM HCL 2 MG/2ML IJ SOLN
INTRAMUSCULAR | Status: AC
  Filled 2015-09-16: qty 2

## 2015-09-16 MED ORDER — FAMOTIDINE 20 MG PO TABS
20.0000 mg | ORAL_TABLET | Freq: Once | ORAL | Status: AC
  Administered 2015-09-16: 20 mg via ORAL

## 2015-09-16 MED ORDER — ASPIRIN 81 MG PO CHEW: 81.0000 mg | CHEWABLE_TABLET | ORAL | Status: DC

## 2015-09-16 MED ORDER — SODIUM CHLORIDE 0.9 % WEIGHT BASED INFUSION: 3.0000 mL/kg/h | INTRAVENOUS | Status: DC

## 2015-09-16 MED ORDER — FENTANYL CITRATE (PF) 100 MCG/2ML IJ SOLN
INTRAMUSCULAR | Status: DC | PRN
  Administered 2015-09-16: 25 ug via INTRAVENOUS
  Administered 2015-09-16: 50 ug via INTRAVENOUS

## 2015-09-16 MED ORDER — FAMOTIDINE 20 MG PO TABS
ORAL_TABLET | ORAL | Status: AC
  Administered 2015-09-16: 09:00:00
  Filled 2015-09-16: qty 1

## 2015-09-16 MED ORDER — ASPIRIN 81 MG PO CHEW
CHEWABLE_TABLET | ORAL | Status: AC
  Administered 2015-09-16: 81 mg
  Filled 2015-09-16: qty 1

## 2015-09-16 MED ORDER — HEPARIN (PORCINE) IN NACL 2-0.9 UNIT/ML-% IJ SOLN
INTRAMUSCULAR | Status: AC
  Filled 2015-09-16: qty 500

## 2015-09-16 MED ORDER — FENTANYL CITRATE (PF) 100 MCG/2ML IJ SOLN
INTRAMUSCULAR | Status: AC
Start: 2015-09-16 — End: 2015-09-16
  Filled 2015-09-16: qty 2

## 2015-09-16 MED ORDER — SODIUM CHLORIDE 0.9 % WEIGHT BASED INFUSION: 1.0000 mL/kg/h | INTRAVENOUS | Status: DC

## 2015-09-16 SURGICAL SUPPLY — 12 items
CATH INFINITI 5 FR IM (CATHETERS) ×3 IMPLANT
CATH INFINITI 5FR ANG PIGTAIL (CATHETERS) ×3 IMPLANT
CATH INFINITI 5FR JL4 (CATHETERS) ×3 IMPLANT
CATH INFINITI JR4 5F (CATHETERS) ×3 IMPLANT
DEVICE CLOSURE MYNXGRIP 5F (Vascular Products) ×3 IMPLANT
KIT MANI 3VAL PERCEP (MISCELLANEOUS) ×3 IMPLANT
NEEDLE PERC 18GX7CM (NEEDLE) ×3 IMPLANT
NEEDLE SMART 18G ACCESS (NEEDLE) ×3 IMPLANT
PACK CARDIAC CATH (CUSTOM PROCEDURE TRAY) ×3 IMPLANT
SHEATH AVANTI 5FR X 11CM (SHEATH) ×3 IMPLANT
WIRE EMERALD 3MM-J .035X150CM (WIRE) ×3 IMPLANT
WIRE EMERALD 3MM-J .035X260CM (WIRE) ×3 IMPLANT

## 2015-09-17 ENCOUNTER — Encounter: Payer: Self-pay | Admitting: Cardiovascular Disease

## 2015-09-19 NOTE — H&P (Signed)
Cardiology Office Note  Date:  09/13/2015   ID:  OLIVIER LESHER, DOB Nov 21, 1940, MRN [id number]  Details taken from clinic note 09/13/15 No change to details below since patient was last seen in clinic  HPI:  Mr. Guimond is a 75 year old gentleman with history of CAD, CABG 2 with LIMA to the LAD, vein graft to the OM June 2016 at [facility], chronic chest pain not relieved with bypass surgery,  chronic angina pectoralis who presents for cardiac cath.  Cardiac catheterization April 2014 Cardiac catheterization April 2015 with stent to the RCA Cardiac catheterization with stent placement March 2016, DES to the mid LAD Notes indicating bypass surgery June 2016 at [place] test in November 2016 for chronic chest pain showing no ischemia He had follow-up cardiac catheterization December 2016 at [facility] Through this time his chest pain has not improved, last cardiac catheterization by report with patent stents  Since then he has been in the hospital with chronic chest pain, cardiac enzymes negative, no workup done at that time given recent catheterization  Chest x-ray showed no evidence of fractures. EF 40 to 45%  02/08/2015  In follow-up today, he reports having escalating anginal symptoms Last week called in, reported taking 4 nitroglycerin for chest pain symptoms Now presenting with exertion Very concerned about new blockages. "Cannot go on like this" Wife presents with him today, did not add much to his story. Previously he was able to exert himself without chest pain, now seems to happen much more on exertion  He continues to take long-acting isosorbide Occasional lightheadedness No regular exercise program Previously referred to the pain clinic. Was declined appointment in [place] does have appointment in [place] early next week. We've previously prescribed tramadol as he reports having allergy to Vicodin and oxycodone. Makes him itch. Does not  think he try the tramadol Previous notes indicating he does not like to take pain medication Previously would delay when he would take nitroglycerin in the setting of discomfort. Now taking nitroglycerin more regularly  chest discomfort despite titrating doses of isosorbide and ranexa. 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 He was initially medically managed.  In April 2015, he underwent repeat catheterization secondary to ongoing chest pain, showing stable moderate to severe disease. He then underwent successful RCA stenting.  In March 2016, due to recurrent chest pain, he underwent successful stenting of the LAD.   he had recurrent chest pain again in June 2016 and was seen at [facility].  He underwent CABG 2 with a LIMA to the LAD and a vein graft to the obtuse marginal.  following CABG, he continued to have persistent/chronic low-level left-sided and midsternal chest pain.  He had repeat hospitalizations with stress testing in November at [facility], which was negative,  catheterization at [facility] in December 2016, which apparently showed patent grafts.  Lab work in the hospital showing total cholesterol 142,  LDL 77, HDL 47  Echocardiogram 03/02/2014 showing normal LV function, essentially normal study  Echocardiogram 04/22/2012 showing ejection fraction 40%, no mention  of wall motion abnormality, mild MR, TR  PMH:   has a past medical history of CAD (coronary artery disease); Cardiomyopathy, ischemic; Carotid arterial disease ([place]); Chronic Chest Pain; Chronic systolic CHF (congestive heart failure) ([place]); and Hyperlipidemia.  PSH:         Past Surgical History:  Procedure Laterality Date  . CARDIAC CATHETERIZATION  05/01/2012  . CARDIAC CATHETERIZATION  04/2013   [facility] stent  . CARDIAC CATHETERIZATION  01/11/15    [facility]  . CARPAL TUNNEL RELEASE     right hand  . CATARACT EXTRACTION W/PHACO Left 09/16/2014   Procedure: CATARACT EXTRACTION PHACO AND INTRAOCULAR LENS PLACEMENT (IOC);  Surgeon: Leandrew Koyanagi, MD;  Location: [place];  Service: Ophthalmology;  Laterality: Left;  . COLONOSCOPY    . CORONARY ANGIOPLASTY WITH STENT PLACEMENT  04/13/2014  . CORONARY ARTERY BYPASS GRAFT  06-26-14  . DE QUERVAIN'S RELEASE Left 08/22/2012  . ESOPHAGOGASTRODUODENOSCOPY (EGD) WITH PROPOFOL N/A 04/26/2015   Procedure: ESOPHAGOGASTRODUODENOSCOPY (EGD) WITH PROPOFOL;  Surgeon: Hulen Luster, MD;  [facility] ENDOSCOPY;  Service: Gastroenterology;  Laterality: N/A;  . right shoulder    . TONSILLECTOMY            Current Outpatient Prescriptions  Medication Sig Dispense Refill  . apixaban (ELIQUIS) 5 MG TABS tablet Take 5 mg by mouth 2 (two) times daily.    Marland Kitchen atorvastatin (LIPITOR) 80 MG tablet Take 80 mg by mouth at bedtime.    . isosorbide mononitrate (IMDUR) 60 MG 24 hr tablet Take 1 tablet by mouth daily.    . nitroGLYCERIN (NITROSTAT) 0.4 MG SL tablet Place 1 tablet (0.4 mg total) under the tongue every 5 (five) minutes as needed for chest pain. 25 tablet 6  . ramipril (ALTACE) 2.5 MG capsule Take 1 capsule (2.5 mg total) by mouth daily. 90 capsule 3  . zolpidem (AMBIEN) 5 MG tablet Take 5 mg by mouth at bedtime as needed for sleep.     No current facility-administered medications for this visit.      Allergies:    Contrast media  [iodinated diagnostic agents]; Oxycodone hcl; and Tramadol   Social History:  The patient  reports that he has quit smoking. His smoking use included Cigarettes. He started smoking about 6 years ago. He has a 35.00 pack-year smoking history. He has never used smokeless tobacco. He reports that he does not drink alcohol or use drugs.   Family History:   family history includes Cancer in his maternal uncle; Heart attack (age of onset: 71) in his father.    Review of Systems: Review of Systems  Constitutional: Positive for malaise/fatigue.  Respiratory: Negative.   Cardiovascular: Positive for chest pain.  Gastrointestinal: Negative.   Musculoskeletal: Negative.   Neurological: Positive for dizziness.  Psychiatric/Behavioral: Negative.   All other systems reviewed and are negative.    PHYSICAL EXAM: VS: from 09/13/15  BP 128/80 (BP Location: Left Arm, Patient Position: Sitting, Cuff Size: Normal)   Pulse (!) 56   Ht 5\' 10"  (1.778 m)   Wt 176 lb 4 oz (79.9 kg)   BMI 25.29 kg/m  , BMI Body mass index is 25.29 kg/m. GEN: Well nourished, well developed, in no acute distress  HEENT: normal  Neck: no JVD, carotid bruits, or masses Cardiac: RRR; no murmurs, rubs, or gallops,no edema  Respiratory:  clear to auscultation  bilaterally, normal work of breathing GI: soft, nontender, nondistended, + BS MS: no deformity or atrophy  Skin: warm and dry, no rash Neuro:  Strength and sensation are intact Psych: euthymic mood, full affect    Recent Labs: 07/19/2015: Hemoglobin 14.4; Platelets 170 09/03/2015: BUN 19; Creatinine, Ser 1.21; Potassium 5.0; Sodium 139    Lipid Panel Recent Labs       Lab Results  Component Value Date   CHOL 122 12/15/2013   HDL 51 12/15/2013   LDLCALC 56 12/15/2013   TRIG 76 12/15/2013           Wt Readings from Last 3 Encounters:  09/13/15 176 lb 4 oz (79.9 kg)  07/26/15 174 lb (78.9 kg)  07/19/15 170 lb (77.1 kg)        ASSESSMENT AND PLAN:  PAF (paroxysmal atrial fibrillation) ([place]) - Plan: EKG [id number], Basic Metabolic Panel (BMET), CBC with Differential/Platelet, INR/PT Maintaining normal sinus rhythm, no changes made to his medications hold anticoagulation for cath 2 days, restart after procedure  Chest pain, unspecified chest pain type - Plan: Basic Metabolic Panel (BMET), CBC with Differential/Platelet, INR/PT  Coronary artery disease involving native coronary artery of native heart without angina pectoris Known disease as detailed below. Cardiac catheterization scheduled for unstable angina symptoms  Hyperlipidemia Cholesterol is at goal on the current lipid regimen. No changes to the medications were made.  Unstable angina (HCC) Worsening chest pain, requiring increasing doses of nitroglycerin consistent with unstable angina. Long history of chest pain though worse recently. Patient was adamant that symptoms could not continue unexplained. Cardiac catheterization scheduled today We have recommended he keep his appointment with the pain clinic August 29 I have reviewed the risks, indications, and alternatives to cardiac catheterization, possible angioplasty, and stenting with the patient. Risks include but are not limited to bleeding, infection, vascular injury, stroke, myocardial infection, arrhythmia, kidney injury, radiation-related injury in the case of prolonged fluoroscopy use, emergency cardiac surgery, and death. The patient understands the risks of serious complication is 1-2 in [id number] with diagnostic cardiac cath and 1-2% or less with angioplasty/stenting.    Coronary artery disease involving coronary bypass graft of native heart with angina pectoris with documented spasm (HCC) Known stent to the LAD, RCA with bypass graft to the LAD and OM vessel  Dizziness Etiology unclear, orthostatics normal No further workup at this time Denies vasovagal symptoms, though seem to  happen after blood draw

## 2015-09-21 DIAGNOSIS — R079 Chest pain, unspecified: Secondary | ICD-10-CM | POA: Diagnosis not present

## 2015-09-21 DIAGNOSIS — G609 Hereditary and idiopathic neuropathy, unspecified: Secondary | ICD-10-CM | POA: Diagnosis not present

## 2015-09-21 DIAGNOSIS — G5603 Carpal tunnel syndrome, bilateral upper limbs: Secondary | ICD-10-CM | POA: Diagnosis not present

## 2015-09-21 DIAGNOSIS — G894 Chronic pain syndrome: Secondary | ICD-10-CM | POA: Diagnosis not present

## 2015-10-01 DIAGNOSIS — Z86010 Personal history of colonic polyps: Secondary | ICD-10-CM | POA: Diagnosis not present

## 2015-10-06 DIAGNOSIS — Z0000 Encounter for general adult medical examination without abnormal findings: Secondary | ICD-10-CM | POA: Diagnosis not present

## 2015-10-06 DIAGNOSIS — I25110 Atherosclerotic heart disease of native coronary artery with unstable angina pectoris: Secondary | ICD-10-CM | POA: Diagnosis not present

## 2015-10-06 DIAGNOSIS — E7800 Pure hypercholesterolemia, unspecified: Secondary | ICD-10-CM | POA: Diagnosis not present

## 2015-10-06 DIAGNOSIS — I480 Paroxysmal atrial fibrillation: Secondary | ICD-10-CM | POA: Diagnosis not present

## 2015-10-06 DIAGNOSIS — I739 Peripheral vascular disease, unspecified: Secondary | ICD-10-CM | POA: Diagnosis not present

## 2015-10-06 DIAGNOSIS — J8489 Other specified interstitial pulmonary diseases: Secondary | ICD-10-CM | POA: Diagnosis not present

## 2015-10-11 ENCOUNTER — Telehealth: Payer: Self-pay | Admitting: Cardiovascular Disease

## 2015-10-11 NOTE — Telephone Encounter (Signed)
Pt c/o of Chest Pain: STAT if CP now or developed within 24 hours  1. Are you having CP right now?yes   2. Are you experiencing any other symptoms (ex. SOB, nausea, vomiting, sweating)? Really bad SOB  3. How long have you been experiencing CP? Few days but this morning gotten worst  4. Is your CP continuous or coming and going? Constant   5. Have you taken Nitroglycerin? No  ?

## 2015-10-11 NOTE — Telephone Encounter (Signed)
Pt reports he was seeding his yard this morning and became SOB w/ 7-8/10 CP. He is sitting still now, sx continue but chest pain has improved to 5/10. He took one nitro, no improvement. Denies any other sx. Recent heart cath and stress test Nov 2016.  Notified Dr. Rockey Situ who recommends tramadol but pt states that, along with hydrocodone, make him itch. MD recommended Aleve for which pt states he can not take that either. He thanked me for the MD recommendations and hung up.

## 2015-10-18 ENCOUNTER — Encounter: Payer: Self-pay | Admitting: Nurse Practitioner

## 2015-10-18 ENCOUNTER — Ambulatory Visit (INDEPENDENT_AMBULATORY_CARE_PROVIDER_SITE_OTHER): Payer: Medicare HMO | Admitting: Nurse Practitioner

## 2015-10-18 VITALS — BP 120/74 | HR 71 | Ht 70.0 in | Wt 172.8 lb

## 2015-10-18 DIAGNOSIS — I480 Paroxysmal atrial fibrillation: Secondary | ICD-10-CM | POA: Diagnosis not present

## 2015-10-18 DIAGNOSIS — I25709 Atherosclerosis of coronary artery bypass graft(s), unspecified, with unspecified angina pectoris: Secondary | ICD-10-CM

## 2015-10-18 DIAGNOSIS — G8929 Other chronic pain: Secondary | ICD-10-CM

## 2015-10-18 DIAGNOSIS — I255 Ischemic cardiomyopathy: Secondary | ICD-10-CM | POA: Diagnosis not present

## 2015-10-18 DIAGNOSIS — E785 Hyperlipidemia, unspecified: Secondary | ICD-10-CM

## 2015-10-18 DIAGNOSIS — I5022 Chronic systolic (congestive) heart failure: Secondary | ICD-10-CM

## 2015-10-18 DIAGNOSIS — R079 Chest pain, unspecified: Secondary | ICD-10-CM

## 2015-10-18 DIAGNOSIS — I110 Hypertensive heart disease with heart failure: Secondary | ICD-10-CM

## 2015-10-18 MED ORDER — ISOSORBIDE MONONITRATE ER 60 MG PO TB24: 90.0000 mg | ORAL_TABLET | Freq: Every day | ORAL | 6 refills | Status: DC

## 2015-10-18 NOTE — Patient Instructions (Signed)
Medication Instructions:  Please increase your isosorbide to 90 mg (1.5 tabs) once daily  Labwork: None  Testing/Procedures: None  Follow-Up: 4-6 weeks  If you need a refill on your cardiac medications before your next appointment, please call your pharmacy.

## 2015-10-18 NOTE — Progress Notes (Signed)
Office Visit    Patient Name: Scott [name] Date of Encounter: 10/18/2015  Primary Care Provider:  Madelyn Brunner, MD Primary Cardiologist:  Johnny Bridge, MD   Chief Complaint    75 year old male with history of CAD status post coronary artery bypass grafting along with chronic chest pain and multiple stress testing catheterizations, who presents for follow-up related to ongoing chest pain.  Past Medical History    Past Medical History:  Diagnosis Date  . CAD (coronary artery disease)    a. 04/2012 Cath: 3VD->Med Rx;  b. 04/2013 PCI RCA (2 DES); c. 03/2014 PCI: LAD 80 (3.0x23 Xience [place]pine DES), 30 ISR in RCA; d. 06/2014 CABG x 2 ([facility]) LIMA->LAD, VG->OM; e. 11/2014 Neg MV;  f. 12/2014 Cath ([facility]): patent grafts->Med Rx; g. 03/2015 MV ([facility]) low risk, EF 40%; h. 05/2015 MV: low risk; i. 08/2015 Cath: patent grafts, patent RCA/LAD stents, small LCX/OM->Med Rx.  . Cardiomyopathy, ischemic    a. 04/2012 Echo: EF 40-45%;  b. 08/2013 Echo: EF 45-50%, mild glob HK, mod lat/post HK, diast dysfxn, mildly dil LA, mild MR, nl RVSP; c. 01/2015 Echo: EF 40-45%, Gr 1 DD, mildly dil LA.  . Carotid arterial disease ([place])    a. 05/2013 Carotid U/S; bilat 40-50% ICA stenosis.  . Chronic Chest Pain   . Chronic systolic CHF (congestive heart failure) ([place])    a. 01/2015 Echo: EF 40-45%.  . Hyperlipidemia    Past Surgical History:  Procedure Laterality Date  . CARDIAC CATHETERIZATION  05/01/2012  . CARDIAC CATHETERIZATION  04/2013   [facility];x3 stent  . CARDIAC CATHETERIZATION  01/11/15    [facility]  . CARDIAC CATHETERIZATION N/A 09/16/2015   Procedure: LEFT HEART CATH AND CORS/GRAFTS ANGIOGRAPHY;  Surgeon: Minna Merritts, MD;  Location: [place] [facility];  Service: Cardiovascular;  Laterality: N/A;  . CARPAL TUNNEL RELEASE     right hand  . CATARACT EXTRACTION W/PHACO Left 09/16/2014   Procedure: CATARACT EXTRACTION PHACO AND INTRAOCULAR LENS PLACEMENT (IOC);  Surgeon: Leandrew Koyanagi, MD;  Location:  [place];  Service: Ophthalmology;  Laterality: Left;  . COLONOSCOPY    . CORONARY ANGIOPLASTY WITH STENT PLACEMENT  04/13/2014  . CORONARY ARTERY BYPASS GRAFT  06-26-14  . DE QUERVAIN'S RELEASE Left 08/22/2012  . ESOPHAGOGASTRODUODENOSCOPY (EGD) WITH PROPOFOL N/A 04/26/2015   Procedure: ESOPHAGOGASTRODUODENOSCOPY (EGD) WITH PROPOFOL;  Surgeon: Hulen Luster, MD;  Location: [facility] ENDOSCOPY;  Service: Gastroenterology;  Laterality: N/A;  . right shoulder    . TONSILLECTOMY      Allergies  Allergies  Allergen Reactions  . Contrast Media [Iodinated Diagnostic Agents] Hives  . Oxycodone Hcl Itching  . Tramadol Itching    History of Present Illness    75 year old male with the above complex past medical history. He has a history of CAD dating back to April 2014 at which time he was found to have severe distal RCA disease with left to right collaterals, a subtotally occluded left circumflex, and moderate, diffuse LAD disease. He was initially medically managed. In April 2015, he underwent repeat catheterization secondary to ongoing chest pain, showing stable moderate to severe disease. He then underwent successful RCA stenting. In March 2016, due to recurrent chest pain, he underwent successful stenting of the LAD. Unfortunate, he had recurrent chest pain again in June 2016 and was seen at [facility]. He underwent CABG 2 with a LIMA to the LAD and a vein graft to the obtuse marginal. Unfortunately, following CABG, he continued to  have persistent/chronic low-level left-sided and midsternal chest pain. He had repeat hospitalizations and also evaluations with stress testing in November 2016 at [facility], which was negative, catheterization at [facility] in December 2016, which showed patent grafts, MV @ [facility] in March, which was low-risk, MV here in May, which was low-risk, and cath in 08/2015 revealing 2/2 patent grafts, patent RCA and LAD stents, and chronic, small vessel LCX/OM dzs.  He has been medically managed  since.  He continues to experience intermittent rest and exertional chest discomfort. At times, he is able to continue working through these symptoms and other times, the pain is so severe he must stop. He will often take a nitroglycerin with some improvement in symptoms though usually not complete relief. Chest pain may last several hours at a time. He notes that the other day, he wrote in his tractor and mowed his yard without any difficulty. The following day, he was raking around the yard and developed chest discomfort that did not limit his ability to continue raking. Overall, symptoms are similar to waist experiencing over the past several years. He also expresses periodic dyspnea on exertion, often with inclines though there isn't any rhyme or reason to this either. Collectively, his symptoms frustrate him as he is never really sure when he should be presented to the ED versus calling the office versus doing nothing. He denies palpitations, PND, orthopnea, dizziness, syncope, edema, or early satiety.  Home Medications    Prior to Admission medications   Medication Sig Start Date End Date Taking? Authorizing Provider  apixaban (ELIQUIS) 5 MG TABS tablet Take 5 mg by mouth 2 (two) times daily.   Yes Historical Provider, MD  atorvastatin (LIPITOR) 80 MG tablet Take 80 mg by mouth at bedtime.   Yes Historical Provider, MD  carvedilol (COREG) 3.125 MG tablet Take 3.125 mg by mouth daily.   Yes Historical Provider, MD  isosorbide mononitrate (IMDUR) 60 MG 24 hr tablet Take 1 tablet by mouth daily. 04/30/15  Yes Historical Provider, MD  nitroGLYCERIN (NITROSTAT) 0.4 MG SL tablet Place 1 tablet (0.4 mg total) under the tongue every 5 (five) minutes as needed for chest pain. 03/29/15  Yes Minna Merritts, MD  ramipril (ALTACE) 2.5 MG capsule Take 1 capsule (2.5 mg total) by mouth daily. 08/26/15  Yes Wellington Hampshire, MD  zolpidem (AMBIEN) 5 MG tablet Take 5 mg by mouth at bedtime as needed for sleep.   Yes  Historical Provider, MD    Review of Systems    As above, he continues to have intermittent rest and exertional chest pain. In that setting he does have some anxiety related to it. He also has intermittent dyspnea on exertion.  All other systems reviewed and are otherwise negative except as noted above.  Physical Exam    VS:  BP 120/74   Pulse 71   Ht 5\' 10"  (1.778 m)   Wt 172 lb 12.8 oz (78.4 kg)   SpO2 96%   BMI 24.79 kg/m  , BMI Body mass index is 24.79 kg/m. GEN: Well nourished, well developed, in no acute distress.  HEENT: normal.  Neck: Supple, no JVD, carotid bruits, or masses. Cardiac: RRR, no murmurs, rubs, or gallops. No clubbing, cyanosis, edema.  Radials/DP/PT 2+ and equal bilaterally.  Respiratory:  Respirations regular and unlabored, clear to auscultation bilaterally. GI: Soft, nontender, nondistended, BS + x 4. MS: no deformity or atrophy. Skin: warm and dry, no rash. Neuro:  Strength and sensation are intact. Psych:  Normal affect.  Accessory Clinical Findings    ECG - Regular sinus rhythm, 69, left axis, inferior infarct, poor R-wave progression. No acute changes.  Assessment & Plan    1.  Chronic chest pain/coronary artery disease: Status post prior CABG 2 along with prior RCA and LAD stenting. He has had chronic chest pain ever since prior to his bypass surgery and has never had any real relief from it. Since November 2016, he has had 3 stress test in 2 catheterizations. The stress test have been low risk while the catheterizations have shown stable anatomy with 2 of 2 patent grafts, patent LAD and RCA stents, and known small vessel circumflex territory disease. We again had a long discussion about his symptoms and his anatomy. As he does get some relief with nitrates, I have increased his isosorbide mononitrate 90 mg daily. He will follow up in pain clinic in early October regarding possible role of neuropathic pain. I have advised that he may contact us within  the next week or so to let us know how his symptoms have done on a higher dose of nitrate and provided that his blood pressure stable, we can continue to titrate nitrate therapy. Once nitrate is maxed out, we can consider ranolazine. He has taken this before and doesn't remember having any significant relief with it. He otherwise remains on statin, beta blocker, ACE inhibitor. He is not on aspirin secondary to need for eliquis.  2. Paroxysmal atrial fibrillation: He is in sinus rhythm today. He remains on beta blocker and eliquis therapy.  3. Hypertensive heart disease: Blood pressure stable today on beta blocker and ACE inhibitor.  4. Hyperlipidemia: Continue statin therapy. Last LDL in our system was 56 in November 2015.  5. Ischemic cardiomyopathy/chronic systolic congestive heart failure: EF 40-45% by echo in January 2017. He is euvolemic on exam. He remains on beta blocker and ACE inhibitor therapy.  6. Disposition: Follow-up in one month to reevaluate symptoms.  Murray Hodgkins, NP 10/18/2015, 4:29 PM

## 2015-10-19 DIAGNOSIS — G8252 Quadriplegia, C1-C4 incomplete: Secondary | ICD-10-CM | POA: Diagnosis not present

## 2015-10-19 DIAGNOSIS — K592 Neurogenic bowel, not elsewhere classified: Secondary | ICD-10-CM | POA: Diagnosis not present

## 2015-10-19 DIAGNOSIS — R69 Illness, unspecified: Secondary | ICD-10-CM | POA: Diagnosis not present

## 2015-10-19 DIAGNOSIS — R609 Edema, unspecified: Secondary | ICD-10-CM | POA: Diagnosis not present

## 2015-10-19 DIAGNOSIS — J029 Acute pharyngitis, unspecified: Secondary | ICD-10-CM | POA: Diagnosis not present

## 2015-10-22 ENCOUNTER — Other Ambulatory Visit: Payer: Self-pay

## 2015-10-22 MED ORDER — CARVEDILOL 3.125 MG PO TABS: 3.1250 mg | ORAL_TABLET | Freq: Every day | ORAL | 3 refills | Status: DC

## 2015-10-25 DIAGNOSIS — G5603 Carpal tunnel syndrome, bilateral upper limbs: Secondary | ICD-10-CM | POA: Diagnosis not present

## 2015-10-25 DIAGNOSIS — G609 Hereditary and idiopathic neuropathy, unspecified: Secondary | ICD-10-CM | POA: Diagnosis not present

## 2015-10-25 DIAGNOSIS — R079 Chest pain, unspecified: Secondary | ICD-10-CM | POA: Diagnosis not present

## 2015-10-25 DIAGNOSIS — G894 Chronic pain syndrome: Secondary | ICD-10-CM | POA: Diagnosis not present

## 2015-10-29 DIAGNOSIS — Z0000 Encounter for general adult medical examination without abnormal findings: Secondary | ICD-10-CM | POA: Diagnosis not present

## 2015-11-01 DIAGNOSIS — M9901 Segmental and somatic dysfunction of cervical region: Secondary | ICD-10-CM | POA: Diagnosis not present

## 2015-11-01 DIAGNOSIS — M531 Cervicobrachial syndrome: Secondary | ICD-10-CM | POA: Diagnosis not present

## 2015-11-29 ENCOUNTER — Ambulatory Visit (INDEPENDENT_AMBULATORY_CARE_PROVIDER_SITE_OTHER): Payer: Medicare HMO | Admitting: Nurse Practitioner

## 2015-11-29 ENCOUNTER — Encounter: Payer: Self-pay | Admitting: Nurse Practitioner

## 2015-11-29 VITALS — BP 110/60 | HR 60 | Ht 70.0 in | Wt 171.8 lb

## 2015-11-29 DIAGNOSIS — I5022 Chronic systolic (congestive) heart failure: Secondary | ICD-10-CM

## 2015-11-29 DIAGNOSIS — I25708 Atherosclerosis of coronary artery bypass graft(s), unspecified, with other forms of angina pectoris: Secondary | ICD-10-CM | POA: Diagnosis not present

## 2015-11-29 DIAGNOSIS — E785 Hyperlipidemia, unspecified: Secondary | ICD-10-CM

## 2015-11-29 DIAGNOSIS — I480 Paroxysmal atrial fibrillation: Secondary | ICD-10-CM

## 2015-11-29 DIAGNOSIS — I10 Essential (primary) hypertension: Secondary | ICD-10-CM

## 2015-11-29 MED ORDER — ISOSORBIDE MONONITRATE ER 120 MG PO TB24: 120.0000 mg | ORAL_TABLET | Freq: Every day | ORAL | 3 refills | Status: DC

## 2015-11-29 NOTE — Progress Notes (Signed)
Office Visit    Patient Name: Scott [name] Date of Encounter: 11/29/2015  Primary Care Provider:  Madelyn Brunner, MD Primary Cardiologist:  Johnny Bridge, MD   Chief Complaint    75 year old male with history of CAD status post coronary artery bypass grafting along with chronic chest pain and multiple stress testing catheterizations, who presents for follow-up related to ongoing chest pain.  Past Medical History    Past Medical History:  Diagnosis Date  . CAD (coronary artery disease)    a. 04/2012 Cath: 3VD->Med Rx;  b. 04/2013 PCI RCA (2 DES); c. 03/2014 PCI: LAD 80 (3.0x23 Xience Alpine DES), 30 ISR in RCA; d. 06/2014 CABG x 2 ([facility]) LIMA->LAD, VG->OM; e. 11/2014 Neg MV;  f. 12/2014 Cath ([facility]): patent grafts->Med Rx; g. 03/2015 MV ([facility]) low risk, EF 40%; h. 05/2015 MV: low risk; i. 08/2015 Cath: patent grafts, patent RCA/LAD stents, small LCX/OM->Med Rx.  . Cardiomyopathy, ischemic    a. 04/2012 Echo: EF 40-45%;  b. 08/2013 Echo: EF 45-50%, mild glob HK, mod lat/post HK, diast dysfxn, mildly dil LA, mild MR, nl RVSP; c. 01/2015 Echo: EF 40-45%, Gr 1 DD, mildly dil LA.  . Carotid arterial disease ([employer])    a. 05/2013 Carotid U/S; bilat 40-50% ICA stenosis.  . Chronic Chest Pain   . Chronic systolic CHF (congestive heart failure) ([place])    a. 01/2015 Echo: EF 40-45%.  . Hyperlipidemia    Past Surgical History:  Procedure Laterality Date  . CARDIAC CATHETERIZATION  05/01/2012  . CARDIAC CATHETERIZATION  04/2013   [facility];x3 stent  . CARDIAC CATHETERIZATION  01/11/15    [facility]  . CARDIAC CATHETERIZATION N/A 09/16/2015   Procedure: LEFT HEART CATH AND CORS/GRAFTS ANGIOGRAPHY;  Surgeon: Minna Merritts, MD;  Location: [place] [facility];  Service: Cardiovascular;  Laterality: N/A;  . CARPAL TUNNEL RELEASE     right hand  . CATARACT EXTRACTION W/PHACO Left 09/16/2014   Procedure: CATARACT EXTRACTION PHACO AND INTRAOCULAR LENS PLACEMENT (IOC);  Surgeon: Leandrew Koyanagi, MD;  Location:  [place];  Service: Ophthalmology;  Laterality: Left;  . COLONOSCOPY    . CORONARY ANGIOPLASTY WITH STENT PLACEMENT  04/13/2014  . CORONARY ARTERY BYPASS GRAFT  06-26-14  . DE QUERVAIN'S RELEASE Left 08/22/2012  . ESOPHAGOGASTRODUODENOSCOPY (EGD) WITH PROPOFOL N/A 04/26/2015   Procedure: ESOPHAGOGASTRODUODENOSCOPY (EGD) WITH PROPOFOL;  Surgeon: Hulen Luster, MD;  Location: [facility] ENDOSCOPY;  Service: Gastroenterology;  Laterality: N/A;  . right shoulder    . TONSILLECTOMY      Allergies  Allergies  Allergen Reactions  . Contrast Media [Iodinated Diagnostic Agents] Hives  . Oxycodone Hcl Itching  . Tramadol Itching    History of Present Illness    75 year old male with the above complex past medical history. He has a history of CAD dating back to April 2014 at which time he was found to have severe distal RCA disease with left to right collaterals, a subtotally occluded left circumflex, and moderate, diffuse LAD disease. He was initially medically managed. In April 2015, he underwent repeat catheterization secondary to ongoing chest pain, showing stable moderate to severe disease. He then underwent successful RCA stenting. In March 2016, due to recurrent chest pain, he underwent successful stenting of the LAD. Unfortunately, he had recurrent chest pain again in June 2016 and was seen at [facility]. He underwent CABG 2 with a LIMA to the LAD and a vein graft to the obtuse marginal. Unfortunately, following CABG, he continued to  have persistent/chronic low-level left-sided and midsternal chest pain. He had repeat hospitalizations and also evaluations with stress testing in November 2016 at [facility], which was negative, catheterization at [facility] in December 2016, which showed patent grafts, MV @ [facility] in March, which was low-risk, MV here in May, which was low-risk, and cath in 08/2015 revealing 2/2 patent grafts, patent RCA and LAD stents, and chronic, small vessel LCX/OM dzs.  He has been medically managed  since.  I saw him in Sept @ which time he continued to have c/p.  We increased his imdur to 90 mg daily.  Since then, he has been eval @ pain clinic and was Rx carafate.  He has not noted any significant change in c/p symptoms since increasing imdur or adding carafate.  He does pretty well in the mornings and is able to be active but by late afternoon, he often notes sscp, sometimes associated with dyspnea, lasting 2-3 hrs at a time, and resolving spontaneously.  He says that he often takes sl ntg for this pain, but it doesn't necessarily help.  He denies palpitations, pnd, orthopnea, n, v, dizziness, syncope, edema, weight gain, or early satiety. Overall, his c/p symptoms are stable over the past year.  Home Medications    Prior to Admission medications   Medication Sig Start Date End Date Taking? Authorizing Provider  apixaban (ELIQUIS) 5 MG TABS tablet Take 5 mg by mouth 2 (two) times daily.   Yes Historical Provider, MD  atorvastatin (LIPITOR) 80 MG tablet Take 80 mg by mouth at bedtime.   Yes Historical Provider, MD  carvedilol (COREG) 3.125 MG tablet Take 1 tablet (3.125 mg total) by mouth daily. 10/22/15  Yes Wellington Hampshire, MD  isosorbide mononitrate (IMDUR) 60 MG 24 hr tablet Take 1.5 tablets (90 mg total) by mouth daily. 10/18/15  Yes Rogelia Mire, NP  nitroGLYCERIN (NITROSTAT) 0.4 MG SL tablet Place 1 tablet (0.4 mg total) under the tongue every 5 (five) minutes as needed for chest pain. 03/29/15  Yes Minna Merritts, MD  ramipril (ALTACE) 2.5 MG capsule Take 1 capsule (2.5 mg total) by mouth daily. 08/26/15  Yes Wellington Hampshire, MD  sucralfate (CARAFATE) 1 g tablet Take 1 g by mouth QID.   Yes Historical Provider, MD  zolpidem (AMBIEN) 5 MG tablet Take 5 mg by mouth at bedtime as needed for sleep.   Yes Historical Provider, MD    Review of Systems    Chest pain as above, occas with DOE.  He denies palpitations, pnd, orthopnea, n, v, dizziness, syncope, edema, weight gain, or  early satiety.   All other systems reviewed and are otherwise negative except as noted above.  Physical Exam    VS:  BP 110/60   Pulse 60   Ht 5\' 10"  (1.778 m)   Wt 171 lb 12 oz (77.9 kg)   SpO2 98%   BMI 24.64 kg/m  , BMI Body mass index is 24.64 kg/m. GEN: Well nourished, well developed, in no acute distress.  HEENT: normal.  Neck: Supple, no JVD, carotid bruits, or masses. Cardiac: RRR, no murmurs, rubs, or gallops. No clubbing, cyanosis, edema.  Radials/DP/PT 2+ and equal bilaterally.  Respiratory:  Respirations regular and unlabored, clear to auscultation bilaterally. GI: Soft, nontender, nondistended, BS + x 4. MS: no deformity or atrophy. Skin: warm and dry, no rash. Neuro:  Strength and sensation are intact. Psych: Normal affect.  Accessory Clinical Findings    ECG - SB, 59, LAD, IVCD.  No acute st/t changes.  Assessment & Plan    1.  Chronic chest pain/CAD:  S/p prior cabg x 2 along with prior RCA and LAD stenting.  He continues to have chronic, daily, c/p and intermittent DOE.  These Ss have been present and stable over the past year, during which time, he has had 3 neg stress tests and 2 catheterizations revealing patent grafts and stents (last 08/2015).  When I saw him last month, I increased his imdur to 90 mg daily.  We discussed ranexa At that time, though he said he had been on it before and it didn't work and was too expensive. He is also since been seen by pain clinic and was told that his symptoms are probably coming from his heart and he was prescribed Carafate. He remains frustrated. We again discussed his symptoms at length and given stability with normal stress test and catheterizations, I'm inclined to continue medical therapy as opposed to repeat any sort of testing at this point. Duration of his chest pain is often hours at a time and thus it is certainly not clear that chest pain is ischemic in origin. I question the role of possible neuropathic pain following  bypass surgery. I will titrate his isosorbide mononitrate to 120 mg daily. We again discussed the possibility of adding Ranexa as a possible next step. He will also follow up with pain clinic again. He otherwise remains on beta blocker and ACE inhibitor. He is not on aspirin secondary to need for eliquis.  2. Paroxysmal atrial fibrillation:He is in sinus rhythm today. He remains on beta blocker and eliquis therapy.  3. Hypertensive heart disease: Blood pressure stable today on beta blocker and ACE inhibitor.  4. Hyperlipidemia: Continue statin therapy. Last LDL in our system was 56 in November 2015.  5. Ischemic cardiomyopathy/chronic systolic congestive heart failure: EF 40-45% by echo in January 2017. He is euvolemic on exam. He remains on beta blocker and ACE inhibitor therapy.  6. Disposition: Follow-up in 6 months to reevaluate symptoms. I have asked him to call us in the next 2 weeks to let us know how he is feeling and give [place] an opportunity to titrate isosorbide further if blood pressure is stable.   Murray Hodgkins, NP 11/29/2015, 1:35 PM

## 2015-11-29 NOTE — Patient Instructions (Addendum)
Medication Instructions:  Please INCREASE your isosorbide to 120 mg once daily Please call the office in 2-3 weeks and let us know if this helps  Labwork: None  Testing/Procedures: None  Follow-Up: 3 months w/ Dr. Rockey Situ  If you need a refill on your cardiac medications before your next appointment, please call your pharmacy.

## 2015-12-03 DIAGNOSIS — G8252 Quadriplegia, C1-C4 incomplete: Secondary | ICD-10-CM | POA: Diagnosis not present

## 2015-12-03 DIAGNOSIS — J029 Acute pharyngitis, unspecified: Secondary | ICD-10-CM | POA: Diagnosis not present

## 2015-12-03 DIAGNOSIS — R69 Illness, unspecified: Secondary | ICD-10-CM | POA: Diagnosis not present

## 2015-12-03 DIAGNOSIS — R609 Edema, unspecified: Secondary | ICD-10-CM | POA: Diagnosis not present

## 2015-12-03 DIAGNOSIS — K592 Neurogenic bowel, not elsewhere classified: Secondary | ICD-10-CM | POA: Diagnosis not present

## 2015-12-06 DIAGNOSIS — R079 Chest pain, unspecified: Secondary | ICD-10-CM | POA: Diagnosis not present

## 2015-12-06 DIAGNOSIS — G894 Chronic pain syndrome: Secondary | ICD-10-CM | POA: Diagnosis not present

## 2015-12-06 DIAGNOSIS — G5603 Carpal tunnel syndrome, bilateral upper limbs: Secondary | ICD-10-CM | POA: Diagnosis not present

## 2015-12-07 ENCOUNTER — Emergency Department: Payer: Medicare HMO

## 2015-12-07 ENCOUNTER — Emergency Department
Admission: EM | Admit: 2015-12-07 | Discharge: 2015-12-07 | Disposition: A | Discharge status: Home / Self Care | Payer: Medicare HMO | Attending: Emergency Medicine | Admitting: Emergency Medicine

## 2015-12-07 DIAGNOSIS — Z79899 Other long term (current) drug therapy: Secondary | ICD-10-CM | POA: Diagnosis not present

## 2015-12-07 DIAGNOSIS — R0602 Shortness of breath: Secondary | ICD-10-CM | POA: Insufficient documentation

## 2015-12-07 DIAGNOSIS — I5042 Chronic combined systolic (congestive) and diastolic (congestive) heart failure: Secondary | ICD-10-CM | POA: Insufficient documentation

## 2015-12-07 DIAGNOSIS — R079 Chest pain, unspecified: Secondary | ICD-10-CM | POA: Diagnosis not present

## 2015-12-07 DIAGNOSIS — R252 Cramp and spasm: Secondary | ICD-10-CM | POA: Insufficient documentation

## 2015-12-07 DIAGNOSIS — I110 Hypertensive heart disease with heart failure: Secondary | ICD-10-CM | POA: Diagnosis not present

## 2015-12-07 DIAGNOSIS — Z87891 Personal history of nicotine dependence: Secondary | ICD-10-CM | POA: Diagnosis not present

## 2015-12-07 DIAGNOSIS — Z955 Presence of coronary angioplasty implant and graft: Secondary | ICD-10-CM | POA: Diagnosis not present

## 2015-12-07 DIAGNOSIS — I2510 Atherosclerotic heart disease of native coronary artery without angina pectoris: Secondary | ICD-10-CM | POA: Diagnosis not present

## 2015-12-07 DIAGNOSIS — R0789 Other chest pain: Secondary | ICD-10-CM | POA: Insufficient documentation

## 2015-12-07 LAB — CBC
HEMATOCRIT: 47 % (ref 40.0–52.0)
Hemoglobin: 15.7 g/dL (ref 13.0–18.0)
MCH: 29.8 pg (ref 26.0–34.0)
MCHC: 33.4 g/dL (ref 32.0–36.0)
MCV: 89.1 fL (ref 80.0–100.0)
PLATELETS: 202 10*3/uL (ref 150–440)
RBC: 5.28 MIL/uL (ref 4.40–5.90)
RDW: 15.3 % — ABNORMAL HIGH (ref 11.5–14.5)
WBC: 5.7 10*3/uL (ref 3.8–10.6)

## 2015-12-07 LAB — BASIC METABOLIC PANEL
Anion gap: 9 (ref 5–15)
BUN: 16 mg/dL (ref 6–20)
CHLORIDE: 107 mmol/L (ref 101–111)
CO2: 24 mmol/L (ref 22–32)
CREATININE: 1.15 mg/dL (ref 0.61–1.24)
Calcium: 9.3 mg/dL (ref 8.9–10.3)
Glucose, Bld: 120 mg/dL — ABNORMAL HIGH (ref 65–99)
POTASSIUM: 4 mmol/L (ref 3.5–5.1)
SODIUM: 140 mmol/L (ref 135–145)

## 2015-12-07 LAB — TROPONIN I: Troponin I: <0.03 ng/mL (ref <0.03)

## 2015-12-07 MED ORDER — ASPIRIN 81 MG PO CHEW
324.0000 mg | CHEWABLE_TABLET | Freq: Once | ORAL | Status: AC
  Administered 2015-12-07: 324 mg via ORAL

## 2015-12-07 MED ORDER — ONDANSETRON HCL 4 MG/2ML IJ SOLN
INTRAMUSCULAR | Status: AC
  Administered 2015-12-07: 4 mg via INTRAVENOUS
  Filled 2015-12-07: qty 2

## 2015-12-07 MED ORDER — LORAZEPAM 1 MG PO TABS
ORAL_TABLET | ORAL | Status: AC
  Administered 2015-12-07: 1 mg via ORAL
  Filled 2015-12-07: qty 1

## 2015-12-07 MED ORDER — MORPHINE SULFATE (PF) 4 MG/ML IV SOLN
4.0000 mg | Freq: Once | INTRAVENOUS | Status: AC
  Administered 2015-12-07: 4 mg via INTRAVENOUS

## 2015-12-07 MED ORDER — NITROGLYCERIN 0.4 MG SL SUBL
0.4000 mg | SUBLINGUAL_TABLET | SUBLINGUAL | Status: DC | PRN
  Administered 2015-12-07 (×2): 0.4 mg via SUBLINGUAL

## 2015-12-07 MED ORDER — CYCLOBENZAPRINE HCL 10 MG PO TABS: 10.0000 mg | ORAL_TABLET | Freq: Three times a day (TID) | ORAL | 0 refills | Status: DC | PRN

## 2015-12-07 MED ORDER — LORAZEPAM 1 MG PO TABS
1.0000 mg | ORAL_TABLET | Freq: Once | ORAL | Status: AC
  Administered 2015-12-07: 1 mg via ORAL

## 2015-12-07 MED ORDER — ASPIRIN 81 MG PO CHEW
CHEWABLE_TABLET | ORAL | Status: AC
  Administered 2015-12-07: 324 mg via ORAL
  Filled 2015-12-07: qty 4

## 2015-12-07 MED ORDER — LORAZEPAM 2 MG/ML IJ SOLN
INTRAMUSCULAR | Status: AC
  Administered 2015-12-07: 1 mg via INTRAVENOUS
  Filled 2015-12-07: qty 1

## 2015-12-07 MED ORDER — MORPHINE SULFATE (PF) 4 MG/ML IV SOLN
INTRAVENOUS | Status: AC
  Administered 2015-12-07: 4 mg via INTRAVENOUS
  Filled 2015-12-07: qty 1

## 2015-12-07 MED ORDER — NITROGLYCERIN 0.4 MG SL SUBL
SUBLINGUAL_TABLET | SUBLINGUAL | Status: AC
  Administered 2015-12-07: 0.4 mg via SUBLINGUAL
  Filled 2015-12-07: qty 3

## 2015-12-07 MED ORDER — ONDANSETRON HCL 4 MG/2ML IJ SOLN
4.0000 mg | Freq: Once | INTRAMUSCULAR | Status: AC
  Administered 2015-12-07: 4 mg via INTRAVENOUS

## 2015-12-07 MED ORDER — LORAZEPAM 2 MG/ML IJ SOLN
1.0000 mg | Freq: Once | INTRAMUSCULAR | Status: AC
  Administered 2015-12-07: 1 mg via INTRAVENOUS

## 2015-12-07 NOTE — ED Provider Notes (Signed)
[facility] Emergency Department Provider Note ____________________________________________   I have reviewed the triage vital signs and the triage nursing note.  HISTORY  Chief Complaint Chest Pain   Historian Patient and wife at the bedside  HPI Scott [name] is a 75 y.o. male with history of CABG about 1 year ago at [facility], follows with Dr. Rockey Situ, presents with several hours of central chest pain at rest, not worse or better with anything.  Waxing and waning in severity and somewhat stabbing when it comes on strong. He states that he tried nitroglycerin at home. He takes Eloquis. He states that he had 2 stress tests this year. He states this is more severe than his typical chest pains. He is not sure whether or not this felt similar to when he needed his CABG.  The first came on when he woke up from sleep feeling short of breath and has persisted. Denies nausea or abdominal pain. Denies sweating. Pain is moderate to severe.    Past Medical History:  Diagnosis Date  . CAD (coronary artery disease)    a. 04/2012 Cath: 3VD->Med Rx;  b. 04/2013 PCI RCA (2 DES); c. 03/2014 PCI: LAD 80 (3.0x23 Xience Alpine DES), 30 ISR in RCA; d. 06/2014 CABG x 2 ([facility]) LIMA->LAD, VG->OM; e. 11/2014 Neg MV;  f. 12/2014 Cath ([facility]): patent grafts->Med Rx; g. 03/2015 MV ([facility]) low risk, EF 40%; h. 05/2015 MV: low risk; i. 08/2015 Cath: patent grafts, patent RCA/LAD stents, small LCX/OM->Med Rx.  . Cardiomyopathy, ischemic    a. 04/2012 Echo: EF 40-45%;  b. 08/2013 Echo: EF 45-50%, mild glob HK, mod lat/post HK, diast dysfxn, mildly dil LA, mild MR, nl RVSP; c. 01/2015 Echo: EF 40-45%, Gr 1 DD, mildly dil LA.  . Carotid arterial disease ([place])    a. 05/2013 Carotid U/S; bilat 40-50% ICA stenosis.  . Chronic Chest Pain   . Chronic systolic CHF (congestive heart failure) ([place])    a. 01/2015 Echo: EF 40-45%.  . Hyperlipidemia     Patient Active Problem List   Diagnosis Date Noted  . Coronary  artery disease involving native coronary artery of native heart with angina pectoris with documented spasm ([place])   . Hx of CABG   . Ischemic cardiomyopathy   . Chronic systolic CHF (congestive heart failure) ([place])   . Chronic Chest Pain   . Coronary artery disease involving coronary bypass graft of native heart with angina pectoris with documented spasm ([place])   . Angina pectoris ([place])   . Anxiety   . Hyperlipemia   . Chronic diastolic heart failure ([place]) 12/21/2014  . Unstable angina ([place]) 12/08/2014  . PAF (paroxysmal atrial fibrillation) ([place]) 12/08/2014  . Chronic anticoagulation 12/08/2014  . Anemia 04/20/2014  . 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 Cardiomyopathy, ischemic   . HTN (hypertension)   . Carotid arterial disease ([place])   . S/P drug eluting coronary stent placement 05/30/2013  . Chest pain 05/05/2013  . SOB (shortness of breath) 05/05/2013  . CAD (coronary artery disease) 05/05/2013  . Hyperlipidemia 05/05/2013  . History of smoking 05/05/2013  . Cardiac angina ([place]) 05/05/2013    Past Surgical History:  Procedure Laterality Date  . CARDIAC CATHETERIZATION  05/01/2012  . CARDIAC CATHETERIZATION  [facility]/2013   [facility]  . CARDIAC CATHETERIZATION  01/11/15    [facility]  . CARDIAC CATHETERIZATION N/A 09/16/2015   Procedure: LEFT HEART CATH AND CORS/GRAFTS ANGIOGRAPHY;  Surgeon: Minna Merritts, MD;  Location: [place] [facility];  Service: Cardiovascular;  Laterality: N/A;  . CARPAL TUNNEL RELEASE     right hand  . CATARACT EXTRACTION W/PHACO Left 09/16/2014   Procedure: CATARACT EXTRACTION PHACO AND INTRAOCULAR LENS PLACEMENT (IOC);  Surgeon: Leandrew Koyanagi, MD;  Location: [place];  Service: Ophthalmology;  Laterality: Left;  . COLONOSCOPY    . CORONARY ANGIOPLASTY WITH STENT PLACEMENT  04/13/2014  . CORONARY ARTERY BYPASS GRAFT  06-26-14  . DE QUERVAIN'S RELEASE Left 08/22/2012  . ESOPHAGOGASTRODUODENOSCOPY (EGD) WITH PROPOFOL N/A 04/26/2015    Procedure: ESOPHAGOGASTRODUODENOSCOPY (EGD) WITH PROPOFOL;  Surgeon: Hulen Luster, MD;  Location: [facility] ENDOSCOPY;  Service: Gastroenterology;  Laterality: N/A;  . right shoulder    . TONSILLECTOMY      Prior to Admission medications   Medication Sig Start Date End Date Taking? Authorizing Provider  apixaban (ELIQUIS) 5 MG TABS tablet Take 5 mg by mouth 2 (two) times daily.   Yes Historical Provider, MD  atorvastatin (LIPITOR) 80 MG tablet Take 80 mg by mouth at bedtime.   Yes Historical Provider, MD  carvedilol (COREG) 3.125 MG tablet Take 1 tablet (3.125 mg total) by mouth daily. 10/22/15  Yes Wellington Hampshire, MD  isosorbide mononitrate (IMDUR) 120 MG 24 hr tablet Take 1 tablet (120 mg total) by mouth daily. 11/29/15  Yes Rogelia Mire, NP  ramipril (ALTACE) 2.5 MG capsule Take 1 capsule (2.5 mg total) by mouth daily. 08/26/15  Yes Wellington Hampshire, MD  sucralfate (CARAFATE) 1 g tablet Take 1 g by mouth 4 (four) times daily as needed.    Yes Historical Provider, MD  cyclobenzaprine (FLEXERIL) 10 MG tablet Take 1 tablet (10 mg total) by mouth 3 (three) times daily as needed for muscle spasms. 12/07/15   Lisa Roca, MD  nitroGLYCERIN (NITROSTAT) 0.4 MG SL tablet Place 1 tablet (0.4 mg total) under the tongue every 5 (five) minutes as needed for chest pain. 03/29/15   Minna Merritts, MD  zolpidem (AMBIEN) 5 MG tablet Take 5 mg by mouth at bedtime as needed for sleep.    Historical Provider, MD    Allergies  Allergen Reactions  . Contrast Media [Iodinated Diagnostic Agents] Hives  . Oxycodone Hcl Itching  . Tramadol Itching    Family History  Problem Relation Age of Onset  . Heart attack Father 22    MI  . Cancer Maternal Uncle     Social History Social History  Substance Use Topics  . Smoking status: Former Smoker    Packs/day: 1.00    Years: 35.00    Types: Cigarettes    Start date: 05/02/2009  . Smokeless tobacco: Never Used     Comment: stopped 10 yrs ago  . Alcohol use No     Review of Systems  Constitutional: Negative for fever. Eyes: Negative for visual changes. ENT: Negative for sore throat. Cardiovascular: As per history of present illness Respiratory: Negative for cough. No pleuritic chest pain. Gastrointestinal: Negative for abdominal pain, vomiting and diarrhea. Genitourinary: Negative for dysuria. Musculoskeletal: Negative for back pain. Skin: Negative for rash. Neurological: Negative for headache. 10 point Review of Systems otherwise negative ____________________________________________   PHYSICAL EXAM:  VITAL SIGNS: ED Triage Vitals  Enc Vitals Group     BP 12/07/15 0709 (!) 175/81     Pulse Rate 12/07/15 0708 82     Resp 12/07/15 0708 20     Temp 12/07/15 0710 97.5 F (36.4 C)     Temp Source 12/07/15 0708 Oral  SpO2 12/07/15 0708 100 %     Weight 12/07/15 0709 171 lb (77.6 kg)     Height 12/07/15 0709 5\' 10"  (1.778 m)     Head Circumference --      Peak Flow --      Pain Score 12/07/15 0709 10     Pain Loc --      Pain Edu? --      Excl. in [place]? --      Constitutional: Alert and oriented. Somewhat anxious and in pain every few seconds he seems to spasm stating his central chest hurt worse. HEENT   Head: Normocephalic and atraumatic.      Eyes: Conjunctivae are normal. PERRL. Normal extraocular movements.      Ears:         Nose: No congestion/rhinnorhea.   Mouth/Throat: Mucous membranes are moist.   Neck: No stridor. Cardiovascular/Chest: Normal rate, regular rhythm.  No murmurs, rubs, or gallops. Respiratory: Normal respiratory effort without tachypnea nor retractions. Breath sounds are clear and equal bilaterally. No wheezes/rales/rhonchi. Gastrointestinal: Soft. No distention, no guarding, no rebound. Nontender.    Genitourinary/rectal:Deferred Musculoskeletal: Nontender with normal range of motion in all extremities. No joint effusions.  No lower extremity tenderness.  No edema. Neurologic:  Normal  speech and language. No gross or focal neurologic deficits are appreciated. Skin:  Skin is warm, dry and intact. No rash noted. Psychiatric: Somewhat anxious.   ____________________________________________  LABS (pertinent positives/negatives)  Labs Reviewed  BASIC METABOLIC PANEL - Abnormal; Notable for the following:       Result Value   Glucose, Bld 120 (*)    All other components within normal limits  CBC - Abnormal; Notable for the following:    RDW 15.3 (*)    All other components within normal limits  TROPONIN I  TROPONIN I    ____________________________________________    EKG I, Lisa Roca, MD, the attending physician have personally viewed and interpreted all ECGs.  EKG #1  7:09 82 bpm. Normal sinus rhythm. Nonspecific intraventricular conduction delay. Left axis deviation. Nonspecific ST and T-wave  EKG #2  7:23  69 bpm. Normal sinus rhythm. Nonspecific intraventricular conduction delay. Left axis deviation. Nonspecific ST and T wave without ST segment elevation. ____________________________________________  RADIOLOGY All Xrays were viewed by me. Imaging interpreted by Radiologist.  Chest x-ray one view portable: No acute abnormalities __________________________________________  PROCEDURES  Procedure(s) performed: None  Critical Care performed: None  ____________________________________________   ED COURSE / ASSESSMENT AND PLAN  Pertinent labs & imaging results that were available during my care of the patient were reviewed by me and considered in my medical decision making (see chart for details).   [name] has a history of coronary artery disease with bypass 1 year ago, history of ongoing intermittent chest discomfort, but this seems stronger to him. 2 EKGs were repeated within about 15 minutes of each other, no findings of STEMI or significant ischemic findings.  Patient treated with aspirin, sublingual nitroglycerin, followed by morphine with  Zofran.  Clinically his symptoms appeared to be more related to spasm with the very strong intermittent component.  I spoke with Dr. Candis Musa, patient's cardiologist who is at this patient has been worked up significantly in the past and ongoing, and active in my discussion of the case, he feels like outpatient follow-up if negative troponin and relief of symptoms.  After Ativan patient did have relief of symptoms, and I spoke with him at sure seems like he is having  a significant stress component as well as muscle spasm.  We discussed some stress management techniques. I am going to try him with a muscle relaxer.  A second troponin is negative and reassuring.    CONSULTATIONS:   Dr. Rockey Situ, cardiologist by phone.   Patient / Family / Caregiver informed of clinical course, medical decision-making process, and agree with plan.     ___________________________________________   FINAL CLINICAL IMPRESSION(S) / ED DIAGNOSES   Final diagnoses:  Atypical chest pain  Spasm              Note: This dictation was prepared with Dragon dictation. Any transcriptional errors that result from this process are unintentional    Lisa Roca, MD 12/07/15 1410

## 2015-12-07 NOTE — ED Triage Notes (Signed)
Pt states he woke with severe chest pain with SOB today.

## 2015-12-07 NOTE — Discharge Instructions (Signed)
You were evaluated for chest discomfort and your exam and evaluation are reassuring.  As we discussed I suspect your symptoms are coming from spasm.  You may try muscle spasm medication.  Also, you might be having esophagus spasm, and I'd like for you to follow up with your gastrologist, Dr. Allen Norris.  Return to the emergency department for any new or worsening chest discomfort, vomiting, black or bloody stool, dizziness or passing out, weakness or numbness, or any other symptoms concerning to you.

## 2015-12-07 NOTE — ED Notes (Signed)
Pt sitting up in bed reading a book. Pt requested that door be left open.

## 2016-01-05 DIAGNOSIS — Z1283 Encounter for screening for malignant neoplasm of skin: Secondary | ICD-10-CM | POA: Diagnosis not present

## 2016-01-05 DIAGNOSIS — Z09 Encounter for follow-up examination after completed treatment for conditions other than malignant neoplasm: Secondary | ICD-10-CM | POA: Diagnosis not present

## 2016-01-05 DIAGNOSIS — Z872 Personal history of diseases of the skin and subcutaneous tissue: Secondary | ICD-10-CM | POA: Diagnosis not present

## 2016-01-05 DIAGNOSIS — Q278 Other specified congenital malformations of peripheral vascular system: Secondary | ICD-10-CM | POA: Diagnosis not present

## 2016-01-07 ENCOUNTER — Emergency Department: Payer: Medicare HMO

## 2016-01-07 ENCOUNTER — Emergency Department
Admission: EM | Admit: 2016-01-07 | Discharge: 2016-01-07 | Disposition: A | Discharge status: Home / Self Care | Payer: Medicare HMO | Attending: Emergency Medicine | Admitting: Emergency Medicine

## 2016-01-07 ENCOUNTER — Encounter: Payer: Self-pay | Admitting: Emergency Medicine

## 2016-01-07 ENCOUNTER — Telehealth: Payer: Self-pay | Admitting: Cardiovascular Disease

## 2016-01-07 DIAGNOSIS — I2510 Atherosclerotic heart disease of native coronary artery without angina pectoris: Secondary | ICD-10-CM | POA: Insufficient documentation

## 2016-01-07 DIAGNOSIS — I110 Hypertensive heart disease with heart failure: Secondary | ICD-10-CM | POA: Diagnosis not present

## 2016-01-07 DIAGNOSIS — Z87891 Personal history of nicotine dependence: Secondary | ICD-10-CM | POA: Insufficient documentation

## 2016-01-07 DIAGNOSIS — I5022 Chronic systolic (congestive) heart failure: Secondary | ICD-10-CM | POA: Diagnosis not present

## 2016-01-07 DIAGNOSIS — Z79899 Other long term (current) drug therapy: Secondary | ICD-10-CM | POA: Diagnosis not present

## 2016-01-07 DIAGNOSIS — R079 Chest pain, unspecified: Secondary | ICD-10-CM | POA: Diagnosis not present

## 2016-01-07 LAB — CBC WITH DIFFERENTIAL/PLATELET
BASOS ABS: 0 10*3/uL (ref 0–0.1)
BASOS PCT: 1 %
EOS ABS: 0.1 10*3/uL (ref 0–0.7)
EOS PCT: 2 %
HCT: 44 % (ref 40.0–52.0)
Hemoglobin: 14.9 g/dL (ref 13.0–18.0)
Lymphocytes Relative: 32 %
Lymphs Abs: 1.6 10*3/uL (ref 1.0–3.6)
MCH: 29.9 pg (ref 26.0–34.0)
MCHC: 33.8 g/dL (ref 32.0–36.0)
MCV: 88.4 fL (ref 80.0–100.0)
Monocytes Absolute: 0.4 10*3/uL (ref 0.2–1.0)
Monocytes Relative: 8 %
Neutro Abs: 3 10*3/uL (ref 1.4–6.5)
Neutrophils Relative %: 57 %
PLATELETS: 215 10*3/uL (ref 150–440)
RBC: 4.98 MIL/uL (ref 4.40–5.90)
RDW: 14.8 % — ABNORMAL HIGH (ref 11.5–14.5)
WBC: 5.1 10*3/uL (ref 3.8–10.6)

## 2016-01-07 LAB — BASIC METABOLIC PANEL
ANION GAP: 5 (ref 5–15)
BUN: 17 mg/dL (ref 6–20)
CO2: 27 mmol/L (ref 22–32)
Calcium: 8.7 mg/dL — ABNORMAL LOW (ref 8.9–10.3)
Chloride: 103 mmol/L (ref 101–111)
Creatinine, Ser: 1.34 mg/dL — ABNORMAL HIGH (ref 0.61–1.24)
GFR calc Af Amer: 59 mL/min — ABNORMAL LOW (ref >60)
GFR, EST NON AFRICAN AMERICAN: 51 mL/min — AB (ref >60)
Glucose, Bld: 111 mg/dL — ABNORMAL HIGH (ref 65–99)
POTASSIUM: 4 mmol/L (ref 3.5–5.1)
SODIUM: 135 mmol/L (ref 135–145)

## 2016-01-07 LAB — TROPONIN I: Troponin I: <0.03 ng/mL (ref <0.03)

## 2016-01-07 LAB — BRAIN NATRIURETIC PEPTIDE: B Natriuretic Peptide: 28 pg/mL (ref 0.0–100.0)

## 2016-01-07 MED ORDER — NITROGLYCERIN 0.4 MG SL SUBL
0.4000 mg | SUBLINGUAL_TABLET | SUBLINGUAL | Status: DC | PRN
  Administered 2016-01-07 (×2): 0.4 mg via SUBLINGUAL
  Filled 2016-01-07: qty 1

## 2016-01-07 MED ORDER — ACETAMINOPHEN 500 MG PO TABS
1000.0000 mg | ORAL_TABLET | Freq: Once | ORAL | Status: AC
  Administered 2016-01-07: 1000 mg via ORAL

## 2016-01-07 MED ORDER — ASPIRIN 81 MG PO CHEW
324.0000 mg | CHEWABLE_TABLET | Freq: Once | ORAL | Status: AC
  Administered 2016-01-07: 324 mg via ORAL
  Filled 2016-01-07: qty 4

## 2016-01-07 MED ORDER — ACETAMINOPHEN 500 MG PO TABS
ORAL_TABLET | ORAL | Status: AC
  Administered 2016-01-07: 1000 mg via ORAL
  Filled 2016-01-07: qty 2

## 2016-01-07 NOTE — ED Notes (Signed)
Pt watching tv.  Family with pt.  nsr on monitor.  

## 2016-01-07 NOTE — ED Notes (Signed)
ED Provider at bedside. 

## 2016-01-07 NOTE — Discharge Instructions (Signed)
Follow-up with your cardiologist on Monday. Return to the emergency room if you have new or worsening chest pain, or any new symptoms concerning to you.

## 2016-01-07 NOTE — ED Provider Notes (Signed)
-----------------------------------------   3:27 PM on 01/07/2016 -----------------------------------------   Blood pressure 124/76, pulse 70, temperature (!) 94.3 F (34.6 C), temperature source Oral, resp. rate 15, SpO2 98 %.  Assuming care from Dr. Alfred Levins.  In short, Scott [name] is a 75 y.o. male with a chief complaint of Chest Pain .  Refer to the original H&P for additional details.  The current plan of care is to follow up second troponin and discharge for outpatient follow up.  Patient has had extensive outpatient cardiology workup(s) and Dr. Rockey Situ is comfortable with plan.    ----------------------------------------- 4:44 PM on 01/07/2016 -----------------------------------------  Second troponin is negative.  I will proceed with discharge as per Dr. Loman Brooklyn recommendations.  I discussed the results with the patient and his family and encouraged outpatient follow-up with Dr. Rockey Situ.   Hinda Kehr, MD 01/07/16 [phone]

## 2016-01-07 NOTE — ED Notes (Signed)
Pt denies changes in symptoms. Pt reports having a headache at this time. MD aware and medication already administered.

## 2016-01-07 NOTE — Telephone Encounter (Signed)
Pt presented to the front desk c/o worsening chest pain all night long.  Pt looks to be in NAD, states that he would like to be evaluated and treated to see if he is ok. Advised pt that if his sx are emergent, to proceed to the ED. 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 Wheelchair provided for transport, though pt insists he can walk.

## 2016-01-07 NOTE — ED Triage Notes (Signed)
Reports cp since last pm, today pain continues and feels dizzy. Skin w/d with good color.

## 2016-01-07 NOTE — ED Notes (Signed)
Resumed care from [facility].  Pt alert.  nsr on monitor. Pt talkative, watching tv.

## 2016-01-07 NOTE — ED Provider Notes (Signed)
[facility] Emergency Department Provider Note  ____________________________________________  Time seen: Approximately 12:48 PM  I have reviewed the triage vital signs and the nursing notes.   HISTORY  Chief Complaint Chest Pain   HPI Scott [name] is a 75 y.o. male a history of CAD status post CABG and PCI, chronic chest pain, CHF, hyperlipidemia who presents for evaluation of chest pain. Patient reports the chest pain is been going on since yesterday 5 PM. Started while he was at rest. He reports that he feels like he gets punched in the chest with the severe chest pain and then a soreness after that. The pain has been coming and going since yesterday. He reports that the severe pain lasts a few seconds and then the soreness lingers for a few minutes at a time. The pain is associated with dizziness, shortness of breath, nausea. 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 Patient currently endorses 4/10 soreness in his chest and shortness of breath. He denies radiation to the back, paresthesias, vomiting, abdominal pain.  Past Medical History:  Diagnosis Date  . CAD (coronary artery disease)    a. 04/2012 Cath: 3VD->Med Rx;  b. 04/2013 PCI RCA (2 DES); c. 03/2014 PCI: LAD 80 (3.0x23 Xience Alpine DES), 30 ISR in RCA; d. 06/2014 CABG x 2 ([facility]) LIMA->LAD, VG->OM; e. 11/2014 Neg MV;  f. 12/2014 Cath ([facility]): patent grafts->Med Rx; g. 03/2015 MV ([facility]) low risk, EF 40%; h. 05/2015 MV: low risk; i. 08/2015 Cath: patent grafts, patent RCA/LAD stents, small LCX/OM->Med Rx.  . Cardiomyopathy, ischemic    a. 04/2012 Echo: EF 40-45%;  b. 08/2013 Echo: EF 45-50%, mild glob HK, mod lat/post HK, diast dysfxn, mildly dil LA, mild MR, nl RVSP; c. 01/2015 Echo: EF 40-45%, Gr 1 DD, mildly dil LA.  . Carotid arterial disease ([place])    a. 05/2013 Carotid U/S; bilat 40-50% ICA stenosis.  . Chronic Chest Pain     . Chronic systolic CHF (congestive heart failure) ([place])    a. 01/2015 Echo: EF 40-45%.  . Hyperlipidemia     Patient Active Problem List   Diagnosis Date Noted  . Coronary artery disease involving native coronary artery of native heart with angina pectoris with documented spasm ([place])   . Hx of CABG   . Ischemic cardiomyopathy   . Chronic systolic CHF (congestive heart failure) ([place])   . Chronic Chest Pain   . Coronary artery disease involving coronary bypass graft of native heart with angina pectoris with documented spasm ([place])   . Angina pectoris ([place])   . Anxiety   . Hyperlipemia   . Chronic diastolic heart failure ([place]) 12/21/2014  . Unstable angina ([place]) 12/08/2014  . PAF (paroxysmal atrial fibrillation) ([place]) 12/08/2014  . Chronic anticoagulation 12/08/2014  . Anemia 04/20/2014  . 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 Cardiomyopathy, ischemic   . HTN (hypertension)   . Carotid arterial disease ([place])   . S/P drug eluting coronary stent placement 05/30/2013  . Chest pain 05/05/2013  . SOB (shortness of breath) 05/05/2013  . CAD (coronary artery disease) 05/05/2013  . Hyperlipidemia 05/05/2013  . History of smoking 05/05/2013  . Cardiac angina ([place]) 05/05/2013    Past Surgical History:  Procedure Laterality Date  . CARDIAC CATHETERIZATION  05/01/2012  . CARDIAC CATHETERIZATION  04/2013   [facility];x3 stent  . CARDIAC CATHETERIZATION  01/11/15    [facility]  .  CARDIAC CATHETERIZATION N/A 09/16/2015   Procedure: LEFT HEART CATH AND CORS/GRAFTS ANGIOGRAPHY;  Surgeon: Minna Merritts, MD;  Location: [place] [facility];  Service: Cardiovascular;  Laterality: N/A;  . CARPAL TUNNEL RELEASE     right hand  . CATARACT EXTRACTION W/PHACO Left 09/16/2014   Procedure: CATARACT EXTRACTION PHACO AND INTRAOCULAR LENS PLACEMENT (IOC);  Surgeon: Leandrew Koyanagi, MD;  Location: [place];  Service: Ophthalmology;  Laterality: Left;  . COLONOSCOPY    . CORONARY ANGIOPLASTY WITH  STENT PLACEMENT  04/13/2014  . CORONARY ARTERY BYPASS GRAFT  06-26-14  . DE QUERVAIN'S RELEASE Left 08/22/2012  . ESOPHAGOGASTRODUODENOSCOPY (EGD) WITH PROPOFOL N/A 04/26/2015   Procedure: ESOPHAGOGASTRODUODENOSCOPY (EGD) WITH PROPOFOL;  Surgeon: Hulen Luster, MD;  Location: [facility];  Service: Gastroenterology;  Laterality: N/A;  . right shoulder    . TONSILLECTOMY      Prior to Admission medications   Medication Sig Start Date End Date Taking? Authorizing Provider  apixaban (ELIQUIS) 5 MG TABS tablet Take 5 mg by mouth 2 (two) times daily.   Yes Historical Provider, MD  atorvastatin (LIPITOR) 80 MG tablet Take 80 mg by mouth at bedtime.   Yes Historical Provider, MD  carvedilol (COREG) 3.125 MG tablet Take 1 tablet (3.125 mg total) by mouth daily. Patient taking differently: Take 3.125 mg by mouth 2 (two) times daily.  10/22/15  Yes Wellington Hampshire, MD  cyclobenzaprine (FLEXERIL) 10 MG tablet Take 1 tablet (10 mg total) by mouth 3 (three) times daily as needed for muscle spasms. 12/07/15  Yes Lisa Roca, MD  isosorbide mononitrate (IMDUR) 120 MG 24 hr tablet Take 1 tablet (120 mg total) by mouth daily. 11/29/15  Yes Rogelia Mire, NP  MAGNESIUM PO Take 1 tablet by mouth daily.   Yes Historical Provider, MD  nitroGLYCERIN (NITROSTAT) 0.4 MG SL tablet Place 1 tablet (0.4 mg total) under the tongue every 5 (five) minutes as needed for chest pain. 03/29/15  Yes Minna Merritts, MD  ramipril (ALTACE) 2.5 MG capsule Take 1 capsule (2.5 mg total) by mouth daily. 08/26/15  Yes Wellington Hampshire, MD  sucralfate (CARAFATE) 1 g tablet Take 1 g by mouth 4 (four) times daily as needed.    Yes Historical Provider, MD  zolpidem (AMBIEN) 5 MG tablet Take 5 mg by mouth at bedtime as needed for sleep.   Yes Historical Provider, MD    Allergies Contrast media [iodinated diagnostic agents]; Oxycodone hcl; and Tramadol  Family History  Problem Relation Age of Onset  . Heart attack Father 49    MI  .  Cancer Maternal Uncle     Social History Social History  Substance Use Topics  . Smoking status: Former Smoker    Packs/day: 1.00    Years: 35.00    Types: Cigarettes    Start date: 05/02/2009  . Smokeless tobacco: Never Used     Comment: stopped 10 yrs ago  . Alcohol use No    Review of Systems  Constitutional: Negative for fever. + Lightheadedness Eyes: Negative for visual changes. ENT: Negative for sore throat. Neck: No neck pain  Cardiovascular: + chest pain. Respiratory: + shortness of breath. Gastrointestinal: Negative for abdominal pain, vomiting or diarrhea. + nausea Genitourinary: Negative for dysuria. Musculoskeletal: Negative for back pain. Skin: Negative for rash. Neurological: Negative for headaches, weakness or numbness. Psych: No SI or HI  ____________________________________________   PHYSICAL EXAM:  VITAL SIGNS: ED Triage Vitals  Enc Vitals Group  BP 01/07/16 1231 (!) 149/90     Pulse Rate 01/07/16 1231 73     Resp 01/07/16 1231 18     Temp 01/07/16 1231 (!) 94.3 F (34.6 C)     Temp Source 01/07/16 1231 Oral     SpO2 01/07/16 1231 98 %     Weight --      Height --      Head Circumference --      Peak Flow --      Pain Score 01/07/16 1223 9     Pain Loc --      Pain Edu? --      Excl. in [place]? --     Constitutional: Alert and oriented. Well appearing and in no apparent distress. HEENT:      Head: Normocephalic and atraumatic.         Eyes: Conjunctivae are normal. Sclera is non-icteric. EOMI. PERRL      Mouth/Throat: Mucous membranes are moist.       Neck: Supple with no signs of meningismus. Cardiovascular: Regular rate and rhythm. No murmurs, gallops, or rubs. 2+ symmetrical distal pulses are present in all extremities. No JVD. Respiratory: Normal respiratory effort. Lungs are clear to auscultation bilaterally. No wheezes, crackles, or rhonchi.  Gastrointestinal: Soft, non tender, and non distended with positive bowel sounds. No  rebound or guarding. Genitourinary: No CVA tenderness. Musculoskeletal: Nontender with normal range of motion in all extremities. No edema, cyanosis, or erythema of extremities. Neurologic: Normal speech and language. Face is symmetric. Moving all extremities. No gross focal neurologic deficits are appreciated. Skin: Skin is warm, dry and intact. No rash noted. Psychiatric: Mood and affect are normal. Speech and behavior are normal.  ____________________________________________   LABS (all labs ordered are listed, but only abnormal results are displayed)  Labs Reviewed  CBC WITH DIFFERENTIAL/PLATELET - Abnormal; Notable for the following:       Result Value   RDW 14.8 (*)    All other components within normal limits  BASIC METABOLIC PANEL - Abnormal; Notable for the following:    Glucose, Bld 111 (*)    Creatinine, Ser 1.34 (*)    Calcium 8.7 (*)    GFR calc non Af Amer 51 (*)    GFR calc Af Amer 59 (*)    All other components within normal limits  BRAIN NATRIURETIC PEPTIDE  TROPONIN I  TROPONIN I   ____________________________________________  EKG  ED ECG REPORT I, Rudene Re, the attending physician, personally viewed and interpreted this ECG.  Normal sinus rhythm, rate of 76, LBBB, normal axis, no ST elevations or depressions. Unchanged from prior  13:15 - Normal sinus rhythm, rate of 78, left bundle branch block, no ST elevations or depressions. Unchanged from earlier.  14:04 normal sinus rhythm, rate of 76, left bundle branch block, no ST elevations or depressions unchanged from prior.-   ____________________________________________  RADIOLOGY  CXR: negative ____________________________________________   PROCEDURES  Procedure(s) performed: None Procedures Critical Care performed:  None ____________________________________________   INITIAL IMPRESSION / ASSESSMENT AND PLAN / ED COURSE  75 y.o. male a history of CAD status post CABG and PCI,  chronic chest pain, CHF, hyperlipidemia who presents for evaluation of multiple episodes of chest pain since yesterday afternoon associated with lightheadedness, shortness of breath, nausea. Initial EKG with no evidence of ischemia. Patient currently would 4/10 pain. Patient be given nitroglycerin and full dose of aspirin. We'll repeat an EKG and 30 minutes. Blood work is pending. Portable chest is  pending. Physical exam with no acute findings. Description of the pain very atypical for ACS. We'll watch on telemetry and cycle cardiac markers. We'll consult Dr. Candis Musa  Clinical Course as of Jan 07 1507  Fri Jan 07, 2016  1413 First troponin is negative. Patient has had 3 EKGs with no ischemic changes. I have discussed patient's presentation, blood work, EKGs, and vital signs with Dr. Candis Musa who is patient's cardiologist who feels that if his cardiac enzymes are negative 2 he is safe to be discharged. This is typical for patient's chronic chest pain. According to Dr. Candis Musa patient underwent CABG and stents in the hopes of treating his chest pain however he continues to have the chest pain. He has been tried on multiple medications for angina with no success. Patient will be followed as an outpatient if repeat cardiac enzymes are negative.  [CV]  1500 Patient remained stable with no evidence of arrhythmia on telemetry. Second troponin is due at 3:50PM. Care transferred to Dr. Karma Greaser.  [CV]    Clinical Course User Index [CV] Rudene Re, MD    Pertinent labs & imaging results that were available during my care of the patient were reviewed by me and considered in my medical decision making (see chart for details).    ____________________________________________   FINAL CLINICAL IMPRESSION(S) / ED DIAGNOSES  Final diagnoses:  Chest pain, unspecified type      NEW MEDICATIONS STARTED DURING THIS VISIT:  New Prescriptions   No medications on file     Note:  This document was prepared  using Dragon voice recognition software and may include unintentional dictation errors.    Rudene Re, MD 01/07/16 [phone]

## 2016-01-25 ENCOUNTER — Telehealth: Payer: Self-pay | Admitting: Cardiovascular Disease

## 2016-01-25 NOTE — Telephone Encounter (Signed)
Pt c/o BP issue: STAT if pt c/o blurred vision, one-sided weakness or slurred speech  1. What are your last 5 BP readings? BP normal but HR is 106  01/25/15 9:40 am  2. Are you having any other symptoms (ex. Dizziness, headache, blurred vision, passed out)? Tightness in the chest and SOB this am.  3. What is your BP issue?

## 2016-01-25 NOTE — Telephone Encounter (Signed)
Patient calling to let us know his hr has come down now to 37

## 2016-01-25 NOTE — Telephone Encounter (Signed)
Left message for pt to call back  °

## 2016-01-26 NOTE — Telephone Encounter (Signed)
Spoke with patient and let him know that heart rate can fluctuate with activity. Let him know to continue monitoring and he stated that he has appointment next week here in our office. He confirmed upcoming appointment and had no further questions or concerns at this time.

## 2016-01-27 ENCOUNTER — Encounter: Payer: Self-pay | Admitting: *Deleted

## 2016-01-28 ENCOUNTER — Ambulatory Visit
Admission: RE | Admit: 2016-01-28 | Discharge: 2016-01-28 | Disposition: A | Discharge status: Home / Self Care | Payer: Medicare HMO | Source: Ambulatory Visit | Attending: Gastroenterology | Admitting: Gastroenterology

## 2016-01-28 ENCOUNTER — Encounter: Payer: Self-pay | Admitting: *Deleted

## 2016-01-28 ENCOUNTER — Ambulatory Visit: Payer: Medicare HMO | Admitting: Anesthesiology

## 2016-01-28 ENCOUNTER — Encounter
Admission: RE | Disposition: A | Discharge status: Home / Self Care | Payer: Self-pay | Source: Ambulatory Visit | Attending: Gastroenterology

## 2016-01-28 DIAGNOSIS — I255 Ischemic cardiomyopathy: Secondary | ICD-10-CM | POA: Diagnosis not present

## 2016-01-28 DIAGNOSIS — K640 First degree hemorrhoids: Secondary | ICD-10-CM | POA: Insufficient documentation

## 2016-01-28 DIAGNOSIS — Z87891 Personal history of nicotine dependence: Secondary | ICD-10-CM | POA: Insufficient documentation

## 2016-01-28 DIAGNOSIS — E785 Hyperlipidemia, unspecified: Secondary | ICD-10-CM | POA: Insufficient documentation

## 2016-01-28 DIAGNOSIS — I739 Peripheral vascular disease, unspecified: Secondary | ICD-10-CM | POA: Diagnosis not present

## 2016-01-28 DIAGNOSIS — D120 Benign neoplasm of cecum: Secondary | ICD-10-CM | POA: Diagnosis not present

## 2016-01-28 DIAGNOSIS — Z1211 Encounter for screening for malignant neoplasm of colon: Secondary | ICD-10-CM | POA: Diagnosis not present

## 2016-01-28 DIAGNOSIS — Z885 Allergy status to narcotic agent status: Secondary | ICD-10-CM | POA: Insufficient documentation

## 2016-01-28 DIAGNOSIS — I5022 Chronic systolic (congestive) heart failure: Secondary | ICD-10-CM | POA: Diagnosis not present

## 2016-01-28 DIAGNOSIS — K5730 Diverticulosis of large intestine without perforation or abscess without bleeding: Secondary | ICD-10-CM | POA: Insufficient documentation

## 2016-01-28 DIAGNOSIS — K635 Polyp of colon: Secondary | ICD-10-CM | POA: Diagnosis not present

## 2016-01-28 DIAGNOSIS — F419 Anxiety disorder, unspecified: Secondary | ICD-10-CM | POA: Insufficient documentation

## 2016-01-28 DIAGNOSIS — Z951 Presence of aortocoronary bypass graft: Secondary | ICD-10-CM | POA: Diagnosis not present

## 2016-01-28 DIAGNOSIS — Z86010 Personal history of colonic polyps: Secondary | ICD-10-CM | POA: Diagnosis not present

## 2016-01-28 DIAGNOSIS — I2510 Atherosclerotic heart disease of native coronary artery without angina pectoris: Secondary | ICD-10-CM | POA: Insufficient documentation

## 2016-01-28 DIAGNOSIS — Z91041 Radiographic dye allergy status: Secondary | ICD-10-CM | POA: Diagnosis not present

## 2016-01-28 DIAGNOSIS — D125 Benign neoplasm of sigmoid colon: Secondary | ICD-10-CM | POA: Insufficient documentation

## 2016-01-28 DIAGNOSIS — K5790 Diverticulosis of intestine, part unspecified, without perforation or abscess without bleeding: Secondary | ICD-10-CM | POA: Diagnosis not present

## 2016-01-28 DIAGNOSIS — I110 Hypertensive heart disease with heart failure: Secondary | ICD-10-CM | POA: Diagnosis not present

## 2016-01-28 DIAGNOSIS — K648 Other hemorrhoids: Secondary | ICD-10-CM | POA: Diagnosis not present

## 2016-01-28 HISTORY — PX: COLONOSCOPY WITH PROPOFOL: SHX5780

## 2016-01-28 SURGERY — COLONOSCOPY WITH PROPOFOL
Anesthesia: General

## 2016-01-28 MED ORDER — FENTANYL CITRATE (PF) 100 MCG/2ML IJ SOLN
INTRAMUSCULAR | Status: DC | PRN
  Administered 2016-01-28: 50 ug via INTRAVENOUS

## 2016-01-28 MED ORDER — PROPOFOL 500 MG/50ML IV EMUL
INTRAVENOUS | Status: AC
  Filled 2016-01-28: qty 50

## 2016-01-28 MED ORDER — FENTANYL CITRATE (PF) 100 MCG/2ML IJ SOLN
INTRAMUSCULAR | Status: AC
  Filled 2016-01-28: qty 2

## 2016-01-28 MED ORDER — LIDOCAINE 2% (20 MG/ML) 5 ML SYRINGE
INTRAMUSCULAR | Status: AC
  Filled 2016-01-28: qty 5

## 2016-01-28 MED ORDER — PROPOFOL 10 MG/ML IV BOLUS
INTRAVENOUS | Status: DC | PRN
  Administered 2016-01-28: 30 mg via INTRAVENOUS
  Administered 2016-01-28 (×2): 10 mg via INTRAVENOUS

## 2016-01-28 MED ORDER — PROPOFOL 500 MG/50ML IV EMUL
INTRAVENOUS | Status: DC | PRN
  Administered 2016-01-28: 75 ug/kg/min via INTRAVENOUS

## 2016-01-28 MED ORDER — LIDOCAINE HCL (PF) 2 % IJ SOLN
INTRAMUSCULAR | Status: DC | PRN
  Administered 2016-01-28: 60 mg

## 2016-01-28 MED ORDER — SODIUM CHLORIDE 0.9 % IV SOLN
INTRAVENOUS | Status: DC
  Administered 2016-01-28: 10:00:00 via INTRAVENOUS

## 2016-01-28 MED ORDER — SODIUM CHLORIDE 0.9 % IV SOLN: INTRAVENOUS | Status: DC

## 2016-01-28 NOTE — Transfer of Care (Signed)
Immediate Anesthesia Transfer of Care Note  Patient: Scott [name]  Procedure(s) Performed: Procedure(s): COLONOSCOPY WITH PROPOFOL (N/A)  Patient Location: PACU  Anesthesia Type:General  Level of Consciousness: sedated  Airway & Oxygen Therapy: Patient Spontanous Breathing and Patient connected to nasal cannula oxygen  Post-op Assessment: Report given to RN and Post -op Vital signs reviewed and stable  Post vital signs: Reviewed and stable  Last Vitals:  Vitals:   01/28/16 0953  BP: (!) 148/76  Pulse: 66  Resp: 14  Temp: (!) 36 C    Last Pain:  Vitals:   01/28/16 0953  TempSrc: Tympanic         Complications: No apparent anesthesia complications

## 2016-01-28 NOTE — Anesthesia Postprocedure Evaluation (Signed)
Anesthesia Post Note  Patient: Scott [name]  Procedure(s) Performed: Procedure(s) (LRB): COLONOSCOPY WITH PROPOFOL (N/A)  Patient location during evaluation: Endoscopy Anesthesia Type: General Level of consciousness: awake and alert Pain management: pain level controlled Vital Signs Assessment: post-procedure vital signs reviewed and stable Respiratory status: spontaneous breathing, nonlabored ventilation, respiratory function stable and patient connected to nasal cannula oxygen Cardiovascular status: blood pressure returned to baseline and stable Postop Assessment: no signs of nausea or vomiting Anesthetic complications: no     Last Vitals:  Vitals:   01/28/16 1208 01/28/16 1218  BP: (!) 119/98 136/82  Pulse: 68 67  Resp: 15 14  Temp:      Last Pain:  Vitals:   01/28/16 1148  TempSrc: Tympanic                 Martha Clan

## 2016-01-28 NOTE — H&P (Signed)
Outpatient short stay form Pre-procedure 01/28/2016 11:07 AM Lollie Sails MD  Primary Physician: Dr. Lisette Grinder  Reason for visit: Colonoscopy  History of present illness:   Patient is a 76 year old male presenting today as above. He has a personal history of colon polyps last colonoscopy being about 5 years ago. He does take Eliquis was but has held that for the past 3 days. He takes no aspirin or blood thinning agents otherwise.     Current Facility-Administered Medications:  .  0.9 %  sodium chloride infusion, , Intravenous, Continuous, Lollie Sails, MD, Last Rate: 20 mL/hr at 01/28/16 1011 .  0.9 %  sodium chloride infusion, , Intravenous, Continuous, Lollie Sails, MD  Facility-Administered Medications Ordered in Other Encounters:  .  fentaNYL (SUBLIMAZE) injection, , , Anesthesia Intra-op, Letitia Neri, CRNA, 50 mcg at 01/28/16 1104  Prescriptions Prior to Admission  Medication Sig Dispense Refill Last Dose  . atorvastatin (LIPITOR) 80 MG tablet Take 80 mg by mouth at bedtime.   01/27/2016 at 2100  . carvedilol (COREG) 3.125 MG tablet Take 1 tablet (3.125 mg total) by mouth daily. (Patient taking differently: Take 3.125 mg by mouth 2 (two) times daily. ) 60 tablet 3 01/27/2016 at 2100  . isosorbide mononitrate (IMDUR) 120 MG 24 hr tablet Take 1 tablet (120 mg total) by mouth daily. 30 tablet 3 01/27/2016 at 0800  . MAGNESIUM PO Take 1 tablet by mouth daily.   01/27/2016 at 0800  . ramipril (ALTACE) 2.5 MG capsule Take 1 capsule (2.5 mg total) by mouth daily. 90 capsule 3 01/27/2016 at 0800  . zolpidem (AMBIEN) 5 MG tablet Take 5 mg by mouth at bedtime as needed for sleep.   01/27/2016 at 2100  . apixaban (ELIQUIS) 5 MG TABS tablet Take 5 mg by mouth 2 (two) times daily.   01/25/2016 at 0800  . cyclobenzaprine (FLEXERIL) 10 MG tablet Take 1 tablet (10 mg total) by mouth 3 (three) times daily as needed for muscle spasms. (Patient not taking: Reported on 01/28/2016) 21 tablet 0 Not  Taking at Unknown time  . nitroGLYCERIN (NITROSTAT) 0.4 MG SL tablet Place 1 tablet (0.4 mg total) under the tongue every 5 (five) minutes as needed for chest pain. 25 tablet 6 01/06/2016 at 2100  . sucralfate (CARAFATE) 1 g tablet Take 1 g by mouth 4 (four) times daily as needed.    Not Taking at Unknown time     Allergies  Allergen Reactions  . Contrast Media [Iodinated Diagnostic Agents] Hives  . Oxycodone Hcl Itching  . Tramadol Itching     Past Medical History:  Diagnosis Date  . CAD (coronary artery disease)    a. 04/2012 Cath: 3VD->Med Rx;  b. 04/2013 PCI RCA (2 DES); c. 03/2014 PCI: LAD 80 (3.0x23 Xience Alpine DES), 30 ISR in RCA; d. 06/2014 CABG x 2 ([facility]) LIMA->LAD, VG->OM; e. 11/2014 Neg MV;  f. 12/2014 Cath ([facility]): patent grafts->Med Rx; g. 03/2015 MV ([facility]) low risk, EF 40%; h. 05/2015 MV: low risk; i. 08/2015 Cath: patent grafts, patent RCA/LAD stents, small LCX/OM->Med Rx.  . Cardiomyopathy, ischemic    a. 04/2012 Echo: EF 40-45%;  b. 08/2013 Echo: EF 45-50%, mild glob HK, mod lat/post HK, diast dysfxn, mildly dil LA, mild MR, nl RVSP; c. 01/2015 Echo: EF 40-45%, Gr 1 DD, mildly dil LA.  . Carotid arterial disease ([place])    a. 05/2013 Carotid U/S; bilat 40-50% ICA stenosis.  . Chronic Chest Pain   . Chronic  systolic CHF (congestive heart failure) ([place])    a. 01/2015 Echo: EF 40-45%.  . Hyperlipidemia     Review of systems:      Physical Exam    Heart and lungs: Regular rate and rhythm without rub or gallop, lungs are bilaterally clear.    HEENT: Normocephalic atraumatic eyes are anicteric    Other:     Pertinant exam for procedure: Soft nontender nondistended bowel sounds positive normoactive    Planned proceedures: Colonoscopy and indicated procedures. I have discussed the risks benefits and complications of procedures to include not limited to bleeding, infection, perforation and the risk of sedation and the patient wishes to proceed.    Lollie Sails,  MD Gastroenterology 01/28/2016  11:07 AM

## 2016-01-28 NOTE — Anesthesia Preprocedure Evaluation (Signed)
Anesthesia Evaluation  Patient identified by MRN, date of birth, ID band Patient awake    Reviewed: Allergy & Precautions, H&P , NPO status , Patient's Chart, lab work & pertinent test results  History of Anesthesia Complications Negative for: history of anesthetic complications  Airway Mallampati: III  TM Distance: >3 FB Neck ROM: limited    Dental  (+) Poor Dentition, Chipped, Caps   Pulmonary neg shortness of breath, former smoker,    Pulmonary exam normal breath sounds clear to auscultation       Cardiovascular Exercise Tolerance: Good hypertension, (-) angina+ CAD, + Past MI, + Cardiac Stents, + CABG, + Peripheral Vascular Disease and +CHF  (-) DOE Normal cardiovascular exam Rhythm:regular Rate:Normal     Neuro/Psych PSYCHIATRIC DISORDERS Anxiety  Neuromuscular disease    GI/Hepatic negative GI ROS, Neg liver ROS, neg GERD  ,  Endo/Other  negative endocrine ROS  Renal/GU negative Renal ROS  negative genitourinary   Musculoskeletal  (+) Arthritis ,   Abdominal   Peds  Hematology negative hematology ROS (+)   Anesthesia Other Findings Past Medical History: No date: CAD (coronary artery disease)     Comment: a. 04/2012 Cath: 3VD->Med Rx;  b. 04/2013 PCI               RCA (2 DES); c. 03/2014 PCI: LAD 80 (3.0x23               Xience Alpine DES), 30 ISR in RCA; d. 06/2014               CABG x 2 ([facility]) LIMA->LAD, VG->OM; e. 11/2014               Neg MV;  f. 12/2014 Cath ([facility]): patent               grafts->Med Rx; g. 03/2015 MV ([facility]) low risk,               EF 40%; h. 05/2015 MV: low risk; i. 08/2015 Cath:              patent grafts, patent RCA/LAD stents, small               LCX/OM->Med Rx. No date: Cardiomyopathy, ischemic     Comment: a. 04/2012 Echo: EF 40-45%;  b. 08/2013 Echo: EF              45-50%, mild glob HK, mod lat/post HK, diast               dysfxn, mildly dil LA, mild MR, nl RVSP; c.                01/2015 Echo: EF 40-45%, Gr 1 DD, mildly dil LA. No date: Carotid arterial disease ([place])     Comment: a. 05/2013 Carotid U/S; bilat 40-50% ICA               stenosis. No date: Chronic Chest Pain No date: Chronic systolic CHF (congestive heart failure*     Comment: a. 01/2015 Echo: EF 40-45%. No date: Hyperlipidemia  Past Surgical History: 05/01/2012: CARDIAC CATHETERIZATION 04/2013: CARDIAC CATHETERIZATION     Comment: [facility];x3 stent 01/11/15 : CARDIAC CATHETERIZATION     Comment: [facility] 09/16/2015: CARDIAC CATHETERIZATION N/A     Comment: Procedure: LEFT HEART CATH AND CORS/GRAFTS               ANGIOGRAPHY;  Surgeon: Minna Merritts, MD;  Location: [place] [facility];  Service:               Cardiovascular;  Laterality: N/A; No date: CARPAL TUNNEL RELEASE     Comment: right hand 09/16/2014: CATARACT EXTRACTION W/PHACO Left     Comment: Procedure: CATARACT EXTRACTION PHACO AND               INTRAOCULAR LENS PLACEMENT (IOC);  Surgeon:               Leandrew Koyanagi, MD;  Location: [place];  Service: Ophthalmology;                Laterality: Left; No date: COLONOSCOPY 04/13/2014: CORONARY ANGIOPLASTY WITH STENT PLACEMENT 06-26-14: CORONARY ARTERY BYPASS GRAFT 08/22/2012: DE QUERVAIN'S RELEASE Left 04/26/2015: ESOPHAGOGASTRODUODENOSCOPY (EGD) WITH PROPOFOL N/A     Comment: Procedure: ESOPHAGOGASTRODUODENOSCOPY (EGD)               WITH PROPOFOL;  Surgeon: Hulen Luster, MD;                Location: [facility] ENDOSCOPY;  Service:               Gastroenterology;  Laterality: N/A; No date: right shoulder No date: TONSILLECTOMY  BMI    Body Mass Index:  24.54 kg/m      Reproductive/Obstetrics negative OB ROS                             Anesthesia Physical Anesthesia Plan  ASA: III  Anesthesia Plan: General   Post-op Pain Management:    Induction:   Airway Management Planned:   Additional Equipment:   Intra-op Plan:    Post-operative Plan:   Informed Consent: I have reviewed the patients History and Physical, chart, labs and discussed the procedure including the risks, benefits and alternatives for the proposed anesthesia with the patient or authorized representative who has indicated his/her understanding and acceptance.   Dental Advisory Given  Plan Discussed with: Anesthesiologist, CRNA and Surgeon  Anesthesia Plan Comments:         Anesthesia Quick Evaluation

## 2016-01-28 NOTE — Op Note (Signed)
[facility] Gastroenterology Patient Name: Scott [name] Procedure Date: 01/28/2016 11:02 AM MRN: [id number] Account #: [id number] Date of Birth: 11-26-1940 Admit Type: Outpatient Age: 76 Room: [facility] ENDO ROOM 1 Gender: Male Note Status: Finalized Procedure:            Colonoscopy Indications:          Personal history of colonic polyps Providers:            Lollie Sails, MD Referring MD:         Hewitt Blade. Sarina Ser, MD (Referring MD) Medicines:            Monitored Anesthesia Care Complications:        No immediate complications. Procedure:            Pre-Anesthesia Assessment:                       - ASA Grade Assessment: III - A patient with severe                        systemic disease.                       After obtaining informed consent, the colonoscope was                        passed under direct vision. Throughout the procedure,                        the patient's blood pressure, pulse, and oxygen                        saturations were monitored continuously. The                        Colonoscope was introduced through the anus and                        advanced to the the cecum, identified by appendiceal                        orifice and ileocecal valve. The colonoscopy was                        performed without difficulty. The patient tolerated the                        procedure well. The quality of the bowel preparation                        was fair. Findings:      Multiple small and large-mouthed diverticula were found in the sigmoid       colon and distal descending colon.      A 2 mm polyp was found in the proximal sigmoid colon. The polyp was       flat. The polyp was removed with a cold biopsy forceps. Resection and       retrieval were complete.      A less than 1 mm polyp was found in the cecum. The polyp was flat. The       polyp was removed with a cold biopsy forceps. Resection and  retrieval       were complete.      A less  than 1 mm polyp was found in the proximal ascending colon. The       polyp was sessile. The polyp was removed with a cold biopsy forceps.       Resection and retrieval were complete.      A 1 mm polyp was found in the distal descending colon. The polyp was       sessile. The polyp was removed with a cold biopsy forceps. Resection and       retrieval were complete.      A less than 1 mm polyp was found in the rectum. The polyp was sessile.       The polyp was removed with a cold biopsy forceps. Resection and       retrieval were complete.      Non-bleeding internal hemorrhoids were found during retroflexion and       during anoscopy. The hemorrhoids were small and Grade I (internal       hemorrhoids that do not prolapse).      No additional abnormalities were found on retroflexion. Impression:           - Preparation of the colon was fair.                       - Diverticulosis in the sigmoid colon and in the distal                        descending colon.                       - One 2 mm polyp in the proximal sigmoid colon, removed                        with a cold biopsy forceps. Resected and retrieved.                       - One less than 1 mm polyp in the cecum, removed with a                        cold biopsy forceps. Resected and retrieved.                       - One less than 1 mm polyp in the proximal ascending                        colon, removed with a cold biopsy forceps. Resected and                        retrieved.                       - One 1 mm polyp in the distal descending colon,                        removed with a cold biopsy forceps. Resected and                        retrieved.                       -  One less than 1 mm polyp in the rectum, removed with                        a cold biopsy forceps. Resected and retrieved.                       - Non-bleeding internal hemorrhoids. Recommendation:       - Discharge patient to home. Procedure Code(s):    ---  Professional ---                       [phone], Colonoscopy, flexible; with biopsy, single or                        multiple Diagnosis Code(s):    --- Professional ---                       K64.0, First degree hemorrhoids                       D12.5, Benign neoplasm of sigmoid colon                       D12.0, Benign neoplasm of cecum                       D12.2, Benign neoplasm of ascending colon                       D12.4, Benign neoplasm of descending colon                       K62.1, Rectal polyp                       Z86.010, Personal history of colonic polyps                       K57.30, Diverticulosis of large intestine without                        perforation or abscess without bleeding CPT copyright 2016 American Medical Association. All rights reserved. The codes documented in this report are preliminary and upon coder review may  be revised to meet current compliance requirements. Lollie Sails, MD 01/28/2016 11:46:47 AM This report has been signed electronically. Number of Addenda: 0 Note Initiated On: 01/28/2016 11:02 AM Scope Withdrawal Time: 0 hours 14 minutes 17 seconds  Total Procedure Duration: 0 hours 25 minutes 3 seconds       [facility]

## 2016-01-31 ENCOUNTER — Ambulatory Visit (INDEPENDENT_AMBULATORY_CARE_PROVIDER_SITE_OTHER): Payer: Medicare HMO | Admitting: Nurse Practitioner

## 2016-01-31 ENCOUNTER — Encounter: Payer: Self-pay | Admitting: Gastroenterology

## 2016-01-31 VITALS — BP 106/64 | HR 68 | Ht 70.0 in | Wt 175.5 lb

## 2016-01-31 DIAGNOSIS — I255 Ischemic cardiomyopathy: Secondary | ICD-10-CM | POA: Diagnosis not present

## 2016-01-31 DIAGNOSIS — I5022 Chronic systolic (congestive) heart failure: Secondary | ICD-10-CM | POA: Diagnosis not present

## 2016-01-31 DIAGNOSIS — I10 Essential (primary) hypertension: Secondary | ICD-10-CM | POA: Diagnosis not present

## 2016-01-31 DIAGNOSIS — I25709 Atherosclerosis of coronary artery bypass graft(s), unspecified, with unspecified angina pectoris: Secondary | ICD-10-CM

## 2016-01-31 DIAGNOSIS — R079 Chest pain, unspecified: Secondary | ICD-10-CM

## 2016-01-31 DIAGNOSIS — I480 Paroxysmal atrial fibrillation: Secondary | ICD-10-CM

## 2016-01-31 DIAGNOSIS — G8929 Other chronic pain: Secondary | ICD-10-CM

## 2016-01-31 LAB — SURGICAL PATHOLOGY

## 2016-01-31 MED ORDER — ISOSORBIDE MONONITRATE ER 60 MG PO TB24: 60.0000 mg | ORAL_TABLET | Freq: Every day | ORAL | 3 refills | Status: DC

## 2016-01-31 NOTE — Patient Instructions (Signed)
Medication Instructions:  Your physician has recommended you make the following change in your medication:  1- DECREASE Isosorbide to 60 mg by mouth once a day.   Labwork: - None ordered.  Testing/Procedures: - None ordered.   Follow-Up: Your physician wants you to follow-up in: [place].  You will receive a reminder letter in the mail two months in advance. If you don't receive a letter, please call our office to schedule the follow-up appointment.  If you need a refill on your cardiac medications before your next appointment, please call your pharmacy.

## 2016-01-31 NOTE — Progress Notes (Signed)
Office Visit    Patient Name: Scott [name] Date of Encounter: 01/31/2016  Primary Care Provider:  Madelyn Brunner, MD Primary Cardiologist:  Johnny Bridge, MD   Chief Complaint    76 y/o ? with a h/o CAD s/p CABG w/ chronic c/p and multiple stress tests and caths, who presents for f/u.  Past Medical History    Past Medical History:  Diagnosis Date  . CAD (coronary artery disease)    a. 04/2012 Cath: 3VD->Med Rx;  b. 04/2013 PCI RCA (2 DES); c. 03/2014 PCI: LAD 80 (3.0x23 Xience Alpine DES), 30 ISR in RCA; d. 06/2014 CABG x 2 ([facility]) LIMA->LAD, VG->OM; e. 11/2014 Neg MV;  f. 12/2014 Cath ([facility]): patent grafts->Med Rx; g. 03/2015 MV ([facility]) low risk, EF 40%; h. 05/2015 MV: low risk; i. 08/2015 Cath: patent grafts, patent RCA/LAD stents, small LCX/OM->Med Rx.  . Cardiomyopathy, ischemic    a. 04/2012 Echo: EF 40-45%;  b. 08/2013 Echo: EF 45-50%, mild glob HK, mod lat/post HK, diast dysfxn, mildly dil LA, mild MR, nl RVSP; c. 01/2015 Echo: EF 40-45%, Gr 1 DD, mildly dil LA.  . Carotid arterial disease ([place])    a. 05/2013 Carotid U/S; bilat 40-50% ICA stenosis.  . Chronic Chest Pain   . Chronic systolic CHF (congestive heart failure) ([place])    a. 01/2015 Echo: EF 40-45%.  . Hyperlipidemia    Past Surgical History:  Procedure Laterality Date  . CARDIAC CATHETERIZATION  05/01/2012  . CARDIAC CATHETERIZATION  04/2013   [facility];x3 stent  . CARDIAC CATHETERIZATION  01/11/15    [facility]  . CARDIAC CATHETERIZATION N/A 09/16/2015   Procedure: LEFT HEART CATH AND CORS/GRAFTS ANGIOGRAPHY;  Surgeon: Minna Merritts, MD;  Location: [place] CV LAB;  Service: Cardiovascular;  Laterality: N/A;  . CARPAL TUNNEL RELEASE     right hand  . CATARACT EXTRACTION W/PHACO Left 09/16/2014   Procedure: CATARACT EXTRACTION PHACO AND INTRAOCULAR LENS PLACEMENT (IOC);  Surgeon: Leandrew Koyanagi, MD;  Location: [place];  Service: Ophthalmology;  Laterality: Left;  . COLONOSCOPY    . COLONOSCOPY WITH PROPOFOL  N/A 01/28/2016   Procedure: COLONOSCOPY WITH PROPOFOL;  Surgeon: Lollie Sails, MD;  Location: [facility] ENDOSCOPY;  Service: Endoscopy;  Laterality: N/A;  . CORONARY ANGIOPLASTY WITH STENT PLACEMENT  04/13/2014  . CORONARY ARTERY BYPASS GRAFT  06-26-14  . DE QUERVAIN'S RELEASE Left 08/22/2012  . ESOPHAGOGASTRODUODENOSCOPY (EGD) WITH PROPOFOL N/A 04/26/2015   Procedure: ESOPHAGOGASTRODUODENOSCOPY (EGD) WITH PROPOFOL;  Surgeon: Hulen Luster, MD;  Location: [facility] ENDOSCOPY;  Service: Gastroenterology;  Laterality: N/A;  . right shoulder    . TONSILLECTOMY      Allergies  Allergies  Allergen Reactions  . Contrast Media [Iodinated Diagnostic Agents] Hives  . Oxycodone Hcl Itching  . Tramadol Itching    History of Present Illness    76 y/o ? w/ the above complex PMH  CAD with severe distal dzs of thet RCA w/ L  R collats and a subtotally occluded LCX, mod LAD dzs by cath in 04/2012.  He was initially medically managed but then in 04/2013, the RCA was stented.  The LAD was stented in 03/2014.  He had recurrent c/p in 06/2014 and underwent CABG x 2 @ [facility]  (LIMA  LAD, VG  OM).  Since CABG, he has had persistent midsternal chest pain and soreness.  He has had multiple myoviews and caths as outlined in [place].  The last cath in 08/2015 showed patent RCA stent  and 2/2 patent grafts.  He has been seen multiple times over the past 6 mos.  He has had stable, persistent symptoms, which continue to frustrate him.  C/p comes on daily, and may last anywhere from a few mins to hours @ a time.  Sometimes he has associated dyspnea.  He was last seen in the ED in mid-December and ECG was non-acute.  Trop was normal.  No change in frequency, severity, or duration of Ss since then.  He has not been seen back in pain clinic.  Home Medications    Prior to Admission medications   Medication Sig Start Date End Date Taking? Authorizing Provider  apixaban (ELIQUIS) 5 MG TABS tablet Take 5 mg by mouth 2 (two) times daily.   Yes Historical  Provider, MD  atorvastatin (LIPITOR) 80 MG tablet Take 80 mg by mouth at bedtime.   Yes Historical Provider, MD  carvedilol (COREG) 3.125 MG tablet Take 3.125 mg by mouth daily.   Yes Historical Provider, MD  MAGNESIUM PO Take 1 tablet by mouth daily.   Yes Historical Provider, MD  nitroGLYCERIN (NITROSTAT) 0.4 MG SL tablet Place 1 tablet (0.4 mg total) under the tongue every 5 (five) minutes as needed for chest pain. 03/29/15  Yes Minna Merritts, MD  ramipril (ALTACE) 2.5 MG capsule Take 1 capsule (2.5 mg total) by mouth daily. 08/26/15  Yes Wellington Hampshire, MD  zolpidem (AMBIEN) 5 MG tablet Take 5 mg by mouth at bedtime as needed for sleep.   Yes Historical Provider, MD  isosorbide mononitrate (IMDUR) 60 MG 24 hr tablet Take 1 tablet (60 mg total) by mouth daily. 01/31/16 04/30/16  Rogelia Mire, NP    Review of Systems    Chest pain as outlined above.  Occas DOE.  Occas LH when standing from a seated position. All other systems reviewed and are otherwise negative except as noted above.  Physical Exam    VS:  BP 106/64 (BP Location: Left Arm, Patient Position: Sitting, Cuff Size: Normal)   Pulse 68   Ht 5\' 10"  (1.778 m)   Wt 175 lb 8 oz (79.6 kg)   BMI 25.18 kg/m  , BMI Body mass index is 25.18 kg/m. GEN: Well nourished, well developed, in no acute distress.  HEENT: normal.  Neck: Supple, no JVD, carotid bruits, or masses. Cardiac: RRR, no murmurs, rubs, or gallops. No clubbing, cyanosis, edema.  Radials/DP/PT 2+ and equal bilaterally. Reports mild chest wall soreness along lower sternum. Respiratory:  Respirations regular and unlabored, clear to auscultation bilaterally. GI: Soft, nontender, nondistended, BS + x 4. MS: no deformity or atrophy. Skin: warm and dry, no rash. Neuro:  Strength and sensation are intact. Psych: Normal affect.  Accessory Clinical Findings    ECG - RSR, 68, LAD, prior inf infarct - no acute st/t changes.  Assessment & Plan    1.  Chronic chest pain  /CAD: s/p prior CABG x 2 along with RCA and LAD stenting prior to CABG.  Last cath in 08/2015 showed patent grafts and stents.  No targets for intervention.  He continues to have daily rest and exertional c/p, similar to prior c/p symptoms over the past 2 years.  He was seen in the ED in mid December for c/p and w/u was unrevealing.  He remains very frustrated by Ss.  We discussed this again @ length today.  Given multiple evaluations over the past 15 months w/ stable findings, and ongoing symptoms of prolonged duration w/o  objective findings of ischemia when evaluated (neg trops in ED visits), I am not inclined to order additional studies @ this time.  I again recommended that he f/u in pain clinic as I suspect that neuropathic pain following CABG may be playing significant role.  He remains on  blocker and acei.  I am going to back down on his imdur from 120 to 60 mg daily b/c he did not notice a change in Ss with titration and he has had some LH with standing.  BP is soft.  No asa 2/2 ongoing eliquis for PAF.  2.  PAF:  In sinus.  No recent palpitations.  Cont  blocker and eliquis.  3.  HTN Heart Dzs:  bp soft.  Will reduce imdur to 60 daily.  4.  HL:  Cont statin Rx.    5.  ICM/Chronic Systolic CHF: EF [id number] by echo in 01/2015. Euvolemic on exam.  Cont  blocker and acei.  6.  Dispo:  F/u in pain clinic.  F/u with Johnny Bridge, MD in 3 mos or sooner if necessary.  Murray Hodgkins, NP 01/31/2016, 3:30 PM

## 2016-02-11 ENCOUNTER — Ambulatory Visit
Admission: RE | Admit: 2016-02-11 | Discharge: 2016-02-11 | Disposition: A | Discharge status: Home / Self Care | Payer: Medicare HMO | Source: Ambulatory Visit | Attending: Anesthesiology | Admitting: Anesthesiology

## 2016-02-11 ENCOUNTER — Other Ambulatory Visit: Payer: Self-pay | Admitting: Anesthesiology

## 2016-02-11 DIAGNOSIS — R079 Chest pain, unspecified: Secondary | ICD-10-CM | POA: Diagnosis not present

## 2016-02-11 DIAGNOSIS — G894 Chronic pain syndrome: Secondary | ICD-10-CM | POA: Diagnosis not present

## 2016-02-11 DIAGNOSIS — R52 Pain, unspecified: Secondary | ICD-10-CM

## 2016-02-11 DIAGNOSIS — R072 Precordial pain: Secondary | ICD-10-CM | POA: Insufficient documentation

## 2016-02-12 ENCOUNTER — Emergency Department (HOSPITAL_COMMUNITY)
Admission: EM | Admit: 2016-02-12 | Discharge: 2016-02-12 | Disposition: A | Discharge status: Home / Self Care | Payer: Medicare HMO | Attending: Emergency Medicine | Admitting: Emergency Medicine

## 2016-02-12 ENCOUNTER — Emergency Department (HOSPITAL_COMMUNITY): Payer: Medicare HMO

## 2016-02-12 ENCOUNTER — Telehealth: Payer: Self-pay | Admitting: Cardiology

## 2016-02-12 ENCOUNTER — Encounter (HOSPITAL_COMMUNITY): Payer: Self-pay | Admitting: Emergency Medicine

## 2016-02-12 DIAGNOSIS — Z87891 Personal history of nicotine dependence: Secondary | ICD-10-CM | POA: Insufficient documentation

## 2016-02-12 DIAGNOSIS — Z951 Presence of aortocoronary bypass graft: Secondary | ICD-10-CM | POA: Diagnosis not present

## 2016-02-12 DIAGNOSIS — Z7901 Long term (current) use of anticoagulants: Secondary | ICD-10-CM | POA: Insufficient documentation

## 2016-02-12 DIAGNOSIS — I2510 Atherosclerotic heart disease of native coronary artery without angina pectoris: Secondary | ICD-10-CM | POA: Diagnosis not present

## 2016-02-12 DIAGNOSIS — Z79899 Other long term (current) drug therapy: Secondary | ICD-10-CM | POA: Insufficient documentation

## 2016-02-12 DIAGNOSIS — R0789 Other chest pain: Secondary | ICD-10-CM | POA: Diagnosis not present

## 2016-02-12 DIAGNOSIS — I110 Hypertensive heart disease with heart failure: Secondary | ICD-10-CM | POA: Diagnosis not present

## 2016-02-12 DIAGNOSIS — I5022 Chronic systolic (congestive) heart failure: Secondary | ICD-10-CM | POA: Diagnosis not present

## 2016-02-12 DIAGNOSIS — Z955 Presence of coronary angioplasty implant and graft: Secondary | ICD-10-CM | POA: Insufficient documentation

## 2016-02-12 DIAGNOSIS — R079 Chest pain, unspecified: Secondary | ICD-10-CM | POA: Diagnosis not present

## 2016-02-12 LAB — BASIC METABOLIC PANEL
Anion gap: 7 (ref 5–15)
BUN: 15 mg/dL (ref 6–20)
CALCIUM: 8.9 mg/dL (ref 8.9–10.3)
CHLORIDE: 105 mmol/L (ref 101–111)
CO2: 24 mmol/L (ref 22–32)
Creatinine, Ser: 1.14 mg/dL (ref 0.61–1.24)
GFR calc non Af Amer: >60 mL/min (ref >60)
GLUCOSE: 83 mg/dL (ref 65–99)
POTASSIUM: 4.1 mmol/L (ref 3.5–5.1)
Sodium: 136 mmol/L (ref 135–145)

## 2016-02-12 LAB — I-STAT TROPONIN, ED: TROPONIN I, POC: 0 ng/mL (ref 0.00–0.08)

## 2016-02-12 LAB — CBC
HEMATOCRIT: 43.2 % (ref 39.0–52.0)
HEMOGLOBIN: 14.2 g/dL (ref 13.0–17.0)
MCH: 29.1 pg (ref 26.0–34.0)
MCHC: 32.9 g/dL (ref 30.0–36.0)
MCV: 88.5 fL (ref 78.0–100.0)
Platelets: 233 10*3/uL (ref 150–400)
RBC: 4.88 MIL/uL (ref 4.22–5.81)
RDW: 14.4 % (ref 11.5–15.5)
WBC: 5.9 10*3/uL (ref 4.0–10.5)

## 2016-02-12 NOTE — ED Notes (Signed)
Patient transported to X-ray 

## 2016-02-12 NOTE — Telephone Encounter (Signed)
Pt called in reporting significant chest pain and dyspnea this morning. 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 Does not normally have dyspnea with his anginal pain, but reports this is significant today. Give his hx I advised that he come to the ER for further evaluation. Does report he saw a the MD at the pain clinic yesterday and had an Xray done. Pt was agreeable to this plan.   Reino Bellis NP-C

## 2016-02-12 NOTE — Discharge Instructions (Signed)
Continue your medications as before.  Follow-up with your cardiologist and pain management doctor in the next 1-2 weeks.

## 2016-02-12 NOTE — ED Triage Notes (Signed)
Pt. Stated, I started having chest pain this morning and what was different was the SOB.

## 2016-02-12 NOTE — ED Provider Notes (Signed)
[facility] DEPT Provider Note   CSN: [id number] Arrival date & time: 02/12/16  1116     History   Chief Complaint Chief Complaint  Patient presents with  . Chest Pain  . Shortness of Breath    HPI Scott [name] is a 76 y.o. male.  2 more patient is a 76 year old male with past medical history of coronary artery disease. He is status post stents and CABG approximately 2 years ago. Since then he has been experiencing intermittent discomfort in his chest. He has been seen multiple times in the emergency department, however no cause has been found. Upon reviewing the record, he had a heart cath performed in August which revealed all of his grafts to be patent and no stenoses. He is been referred to a pain clinic for this. He denies any fevers or chills. He denies any productive cough.      Past Medical History:  Diagnosis Date  . CAD (coronary artery disease)    a. 04/2012 Cath: 3VD->Med Rx;  b. 04/2013 PCI RCA (2 DES); c. 03/2014 PCI: LAD 80 (3.0x23 Xience Alpine DES), 30 ISR in RCA; d. 06/2014 CABG x 2 ([facility]) LIMA->LAD, VG->OM; e. 11/2014 Neg MV;  f. 12/2014 Cath ([facility]): patent grafts->Med Rx; g. 03/2015 MV ([facility]) low risk, EF 40%; h. 05/2015 MV: low risk; i. 08/2015 Cath: patent grafts, patent RCA/LAD stents, small LCX/OM->Med Rx.  . Cardiomyopathy, ischemic    a. 04/2012 Echo: EF 40-45%;  b. 08/2013 Echo: EF 45-50%, mild glob HK, mod lat/post HK, diast dysfxn, mildly dil LA, mild MR, nl RVSP; c. 01/2015 Echo: EF 40-45%, Gr 1 DD, mildly dil LA.  . Carotid arterial disease ([place])    a. 05/2013 Carotid U/S; bilat 40-50% ICA stenosis.  . Chronic Chest Pain   . Chronic systolic CHF (congestive heart failure) ([place])    a. 01/2015 Echo: EF 40-45%.  . Hyperlipidemia     Patient Active Problem List   Diagnosis Date Noted  . Coronary artery disease involving native coronary artery of native heart with angina pectoris with documented spasm ([place])   . Hx of CABG   . Ischemic cardiomyopathy   .  Chronic systolic CHF (congestive heart failure) ([place])   . Chronic Chest Pain   . Coronary artery disease involving coronary bypass graft of native heart with angina pectoris with documented spasm ([place])   . Angina pectoris ([place])   . Anxiety   . Hyperlipemia   . Chronic diastolic heart failure ([place]) 12/21/2014  . Unstable angina ([place]) 12/08/2014  . PAF (paroxysmal atrial fibrillation) ([place]) 12/08/2014  . Chronic anticoagulation 12/08/2014  . Anemia 04/20/2014  . 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 Cardiomyopathy, ischemic   . HTN (hypertension)   . Carotid arterial disease ([place])   . S/P drug eluting coronary stent placement 05/30/2013  . Chest pain 05/05/2013  . SOB (shortness of breath) 05/05/2013  . CAD (coronary artery disease) 05/05/2013  . Hyperlipidemia 05/05/2013  . History of smoking 05/05/2013  . Cardiac angina ([place]) 05/05/2013    Past Surgical History:  Procedure Laterality Date  . CARDIAC CATHETERIZATION  05/01/2012  . CARDIAC CATHETERIZATION  04/2013   [facility]  . CARDIAC CATHETERIZATION  01/11/15    [facility]  . CARDIAC CATHETERIZATION N/A 09/16/2015   Procedure: LEFT HEART CATH AND CORS/GRAFTS ANGIOGRAPHY;  Surgeon: Minna Merritts, MD;  Location: [place] CV LAB;  Service: Cardiovascular;  Laterality: N/A;  . CARPAL TUNNEL RELEASE     right hand  .  CATARACT EXTRACTION W/PHACO Left 09/16/2014   Procedure: CATARACT EXTRACTION PHACO AND INTRAOCULAR LENS PLACEMENT (IOC);  Surgeon: Leandrew Koyanagi, MD;  Location: [place];  Service: Ophthalmology;  Laterality: Left;  . COLONOSCOPY    . COLONOSCOPY WITH PROPOFOL N/A 01/28/2016   Procedure: COLONOSCOPY WITH PROPOFOL;  Surgeon: Lollie Sails, MD;  Location: [facility] ENDOSCOPY;  Service: Endoscopy;  Laterality: N/A;  . CORONARY ANGIOPLASTY WITH STENT PLACEMENT  04/13/2014  . CORONARY ARTERY BYPASS GRAFT  06-26-14  . DE QUERVAIN'S RELEASE Left 08/22/2012  . ESOPHAGOGASTRODUODENOSCOPY (EGD) WITH PROPOFOL N/A  04/26/2015   Procedure: ESOPHAGOGASTRODUODENOSCOPY (EGD) WITH PROPOFOL;  Surgeon: Hulen Luster, MD;  Location: [facility] ENDOSCOPY;  Service: Gastroenterology;  Laterality: N/A;  . right shoulder    . TONSILLECTOMY         Home Medications    Prior to Admission medications   Medication Sig Start Date End Date Taking? Authorizing Provider  apixaban (ELIQUIS) 5 MG TABS tablet Take 5 mg by mouth 2 (two) times daily.    Historical Provider, MD  atorvastatin (LIPITOR) 80 MG tablet Take 80 mg by mouth at bedtime.    Historical Provider, MD  carvedilol (COREG) 3.125 MG tablet Take 3.125 mg by mouth daily.    Historical Provider, MD  isosorbide mononitrate (IMDUR) 60 MG 24 hr tablet Take 1 tablet (60 mg total) by mouth daily. 01/31/16 04/30/16  Rogelia Mire, NP  MAGNESIUM PO Take 1 tablet by mouth daily.    Historical Provider, MD  nitroGLYCERIN (NITROSTAT) 0.4 MG SL tablet Place 1 tablet (0.4 mg total) under the tongue every 5 (five) minutes as needed for chest pain. 03/29/15   Minna Merritts, MD  ramipril (ALTACE) 2.5 MG capsule Take 1 capsule (2.5 mg total) by mouth daily. 08/26/15   Wellington Hampshire, MD  zolpidem (AMBIEN) 5 MG tablet Take 5 mg by mouth at bedtime as needed for sleep.    Historical Provider, MD    Family History Family History  Problem Relation Age of Onset  . Heart attack Father 31    MI  . Cancer Maternal Uncle     Social History Social History  Substance Use Topics  . Smoking status: Former Smoker    Packs/day: 1.00    Years: 35.00    Types: Cigarettes    Start date: 05/02/2009  . Smokeless tobacco: Never Used     Comment: stopped 10 yrs ago  . Alcohol use No     Allergies   Contrast media [iodinated diagnostic agents]; Oxycodone hcl; and Tramadol   Review of Systems Review of Systems  All other systems reviewed and are negative.    Physical Exam Updated Vital Signs BP 125/73 (BP Location: Left Arm)   Pulse 60   Temp 97.8 F (36.6 C) (Oral)   Resp 19    Ht 5\' 10"  (1.778 m)   Wt 175 lb (79.4 kg)   SpO2 100%   BMI 25.11 kg/m   Physical Exam  Constitutional: He is oriented to person, place, and time. He appears well-developed and well-nourished. No distress.  HENT:  Head: Normocephalic and atraumatic.  Mouth/Throat: Oropharynx is clear and moist.  Neck: Normal range of motion. Neck supple.  Cardiovascular: Normal rate and regular rhythm.  Exam reveals no friction rub.   No murmur heard. Pulmonary/Chest: Effort normal and breath sounds normal. No respiratory distress. He has no wheezes. He has no rales. He exhibits tenderness.  There is tenderness to palpation of the anterior chest  wall. It is unclear as to whether this reproduces his symptoms or not.  Abdominal: Soft. Bowel sounds are normal. He exhibits no distension. There is no tenderness.  Musculoskeletal: Normal range of motion. He exhibits no edema.  Neurological: He is alert and oriented to person, place, and time. Coordination normal.  Skin: Skin is warm and dry. He is not diaphoretic.  Nursing note and vitals reviewed.    ED Treatments / Results  Labs (all labs ordered are listed, but only abnormal results are displayed) Labs Reviewed  [place], ED    EKG  EKG Interpretation  Date/Time:  Saturday February 12 2016 11:21:39 EST Ventricular Rate:  64 PR Interval:  180 QRS Duration: 126 QT Interval:  406 QTC Calculation: 418 R Axis:   -19 Text Interpretation:  Normal sinus rhythm with sinus arrhythmia Non-specific intra-ventricular conduction block Abnormal ECG Confirmed by Clarissa Laird  MD, Taqwa Deem ([id number]) on 02/12/2016 1:47:36 PM       Radiology Dg Sternum  Result Date: 02/11/2016 CLINICAL DATA:  Sternal pain for 2-3 months. History of median sternotomy and CABG. EXAM: STERNUM - 2+ VIEW COMPARISON:  Chest x-ray dated 05/31/2015 FINDINGS: Seven sternal wires are noted, unchanged in appearance since the prior exam. No visible  protrusions of the wires. No sternal erosions or fractures. IMPRESSION: No acute abnormalities. Electronically Signed   By: Lorriane Shire M.D.   On: 02/11/2016 14:37    Procedures Procedures (including critical care time)  Medications Ordered in ED Medications - No data to display   Initial Impression / Assessment and Plan / ED Course  I have reviewed the triage vital signs and the nursing notes.  Pertinent labs & imaging results that were available during my care of the patient were reviewed by me and considered in my medical decision making (see chart for details).     Patient is a 76 year old male with history of CABG 2 years ago. He presents today with complaints of chest discomfort. He has been having intermittent pains in his chest for the past 2 years. He has been seen in the ER multiple times at [facility], however no cause has been found. Upon reviewing his record, he has had multiple workups, imaging studies, and even underwent heart cath in August 2017. All of these have been unremarkable.  He is tender to palpation in the anterior chest wall, and I suspect some sort of musculoskeletal etiology. He may have developed some sort of chronic pain syndrome where the sternal incision was made. Nothing today indicates an acute cardiac event and I do not feel as though cardiology consultation or admission is indicated. The patient will be instructed to follow-up with his cardiologist and [place] and pain management specialist he has been referred to here in [place].  Final Clinical Impressions(s) / ED Diagnoses   Final diagnoses:  None    New Prescriptions New Prescriptions   No medications on file     Veryl Speak, MD 02/12/16 1349

## 2016-02-17 ENCOUNTER — Other Ambulatory Visit (HOSPITAL_COMMUNITY): Payer: Self-pay | Admitting: Anesthesiology

## 2016-02-17 ENCOUNTER — Other Ambulatory Visit: Payer: Self-pay | Admitting: Anesthesiology

## 2016-02-17 DIAGNOSIS — R079 Chest pain, unspecified: Secondary | ICD-10-CM

## 2016-02-18 DIAGNOSIS — R05 Cough: Secondary | ICD-10-CM | POA: Diagnosis not present

## 2016-02-18 DIAGNOSIS — R0789 Other chest pain: Secondary | ICD-10-CM | POA: Diagnosis not present

## 2016-02-23 ENCOUNTER — Encounter
Admission: RE | Admit: 2016-02-23 | Discharge: 2016-02-23 | Disposition: A | Discharge status: Home / Self Care | Payer: Medicare HMO | Source: Ambulatory Visit | Attending: Anesthesiology | Admitting: Anesthesiology

## 2016-02-23 DIAGNOSIS — R072 Precordial pain: Secondary | ICD-10-CM | POA: Diagnosis not present

## 2016-02-23 DIAGNOSIS — R079 Chest pain, unspecified: Secondary | ICD-10-CM

## 2016-02-23 MED ORDER — TECHNETIUM TC 99M MEDRONATE IV KIT
25.0000 | PACK | Freq: Once | INTRAVENOUS | Status: AC | PRN
  Administered 2016-02-23: 21.79 via INTRAVENOUS

## 2016-02-25 ENCOUNTER — Other Ambulatory Visit
Admission: RE | Admit: 2016-02-25 | Discharge: 2016-02-25 | Disposition: A | Discharge status: Home / Self Care | Payer: Medicare HMO | Source: Ambulatory Visit | Attending: Anesthesiology | Admitting: Anesthesiology

## 2016-02-25 DIAGNOSIS — R079 Chest pain, unspecified: Secondary | ICD-10-CM | POA: Diagnosis not present

## 2016-02-25 DIAGNOSIS — G894 Chronic pain syndrome: Secondary | ICD-10-CM | POA: Diagnosis not present

## 2016-02-25 LAB — CBC WITH DIFFERENTIAL/PLATELET
BASOS PCT: 1 %
Basophils Absolute: 0.1 10*3/uL (ref 0–0.1)
Eosinophils Absolute: 0 10*3/uL (ref 0–0.7)
Eosinophils Relative: 1 %
HEMATOCRIT: 44.6 % (ref 40.0–52.0)
Hemoglobin: 15 g/dL (ref 13.0–18.0)
Lymphocytes Relative: 32 %
Lymphs Abs: 2 10*3/uL (ref 1.0–3.6)
MCH: 29.3 pg (ref 26.0–34.0)
MCHC: 33.5 g/dL (ref 32.0–36.0)
MCV: 87.3 fL (ref 80.0–100.0)
MONO ABS: 0.5 10*3/uL (ref 0.2–1.0)
Monocytes Relative: 8 %
NEUTROS ABS: 3.7 10*3/uL (ref 1.4–6.5)
Neutrophils Relative %: 58 %
Platelets: 249 10*3/uL (ref 150–440)
RBC: 5.11 MIL/uL (ref 4.40–5.90)
RDW: 14.6 % — AB (ref 11.5–14.5)
WBC: 6.3 10*3/uL (ref 3.8–10.6)

## 2016-02-25 LAB — SEDIMENTATION RATE: Sed Rate: 3 mm/hr (ref 0–20)

## 2016-02-25 LAB — C-REACTIVE PROTEIN: CRP: 0.8 mg/dL (ref <1.0)

## 2016-03-17 DIAGNOSIS — M5137 Other intervertebral disc degeneration, lumbosacral region: Secondary | ICD-10-CM | POA: Diagnosis not present

## 2016-03-17 DIAGNOSIS — M9903 Segmental and somatic dysfunction of lumbar region: Secondary | ICD-10-CM | POA: Diagnosis not present

## 2016-03-21 ENCOUNTER — Encounter: Payer: Self-pay | Admitting: Thoracic Surgery (Cardiothoracic Vascular Surgery)

## 2016-03-21 ENCOUNTER — Other Ambulatory Visit: Payer: Self-pay | Admitting: *Deleted

## 2016-03-21 ENCOUNTER — Institutional Professional Consult (permissible substitution) (INDEPENDENT_AMBULATORY_CARE_PROVIDER_SITE_OTHER): Payer: Medicare HMO | Admitting: Thoracic Surgery (Cardiothoracic Vascular Surgery)

## 2016-03-21 VITALS — BP 129/76 | HR 64 | Resp 16 | Ht 70.0 in | Wt 177.0 lb

## 2016-03-21 DIAGNOSIS — I25119 Atherosclerotic heart disease of native coronary artery with unspecified angina pectoris: Secondary | ICD-10-CM | POA: Diagnosis not present

## 2016-03-21 DIAGNOSIS — I739 Peripheral vascular disease, unspecified: Secondary | ICD-10-CM | POA: Diagnosis not present

## 2016-03-21 DIAGNOSIS — Z6825 Body mass index (BMI) 25.0-25.9, adult: Secondary | ICD-10-CM | POA: Diagnosis not present

## 2016-03-21 DIAGNOSIS — E7800 Pure hypercholesterolemia, unspecified: Secondary | ICD-10-CM | POA: Diagnosis not present

## 2016-03-21 DIAGNOSIS — Z951 Presence of aortocoronary bypass graft: Secondary | ICD-10-CM | POA: Diagnosis not present

## 2016-03-21 DIAGNOSIS — R0789 Other chest pain: Secondary | ICD-10-CM

## 2016-03-21 DIAGNOSIS — R079 Chest pain, unspecified: Secondary | ICD-10-CM | POA: Diagnosis not present

## 2016-03-21 DIAGNOSIS — Z87891 Personal history of nicotine dependence: Secondary | ICD-10-CM | POA: Diagnosis not present

## 2016-03-21 DIAGNOSIS — Z961 Presence of intraocular lens: Secondary | ICD-10-CM | POA: Diagnosis not present

## 2016-03-21 DIAGNOSIS — H9313 Tinnitus, bilateral: Secondary | ICD-10-CM | POA: Diagnosis not present

## 2016-03-21 DIAGNOSIS — I10 Essential (primary) hypertension: Secondary | ICD-10-CM | POA: Diagnosis not present

## 2016-03-21 DIAGNOSIS — Z0000 Encounter for general adult medical examination without abnormal findings: Secondary | ICD-10-CM | POA: Diagnosis not present

## 2016-03-21 DIAGNOSIS — J8489 Other specified interstitial pulmonary diseases: Secondary | ICD-10-CM | POA: Diagnosis not present

## 2016-03-21 DIAGNOSIS — I480 Paroxysmal atrial fibrillation: Secondary | ICD-10-CM | POA: Diagnosis not present

## 2016-03-21 DIAGNOSIS — G4700 Insomnia, unspecified: Secondary | ICD-10-CM | POA: Diagnosis not present

## 2016-03-21 DIAGNOSIS — Z9849 Cataract extraction status, unspecified eye: Secondary | ICD-10-CM | POA: Diagnosis not present

## 2016-03-21 NOTE — Progress Notes (Signed)
PCP is WALKER III, JOHN B, MD Referring Provider is Phillips, Mark, MD  Chief Complaint  Patient presents with  . Referral    from Dr. Phillips, pain management, to r/o sternal nonunion...s/p CABG 2 YS AGO AT [facility]...CT CHEST 08/10/15, BONE SCAN 02/23/16  . Chest Pain    CHRONIC PAIN SYNDROME    HPI: Mr. Concannon is a 76-year-old gentleman sent for consultation regarding sternal pain.  Mr. Beckham is a 76-year-old gentleman with a history of coronary artery disease, ischemic cardiomyopathy, carotid arterial disease, chronic systolic congestive heart failure, and hyperlipidemia. He underwent coronary bypass grafting 2 at [facility] in June 2016. He says he had what he thought were normal levels of pain postoperatively and then his pain improved for a while. About a year ago he began having severe chest discomfort usually related to activities such as bending or lifting heavy objects. Per his notes he had 2 separate pain syndromes one was more consistent with angina with some shortness of breath and radiation to the left shoulder. The other was a sharp pain in the sternum. He was seen multiple times back at [facility] and had extensive workup including cardiac catheterization back in August 2017. He says he was told everything was okay on the catheterization. He is on Eliquis. He was able stop that for 3 days prior to colonoscopy. He has tried various medications none of which provide any significant pain relief.   Past Medical History:  Diagnosis Date  . CAD (coronary artery disease)    a. 04/2012 Cath: 3VD->Med Rx;  b. 04/2013 PCI RCA (2 DES); c. 03/2014 PCI: LAD 80 (3.0x23 Xience Alpine DES), 30 ISR in RCA; d. 06/2014 CABG x 2 ([facility]) LIMA->LAD, VG->OM; e. 11/2014 Neg MV;  f. 12/2014 Cath ([facility]): patent grafts->Med Rx; g. 03/2015 MV ([facility]) low risk, EF 40%; h. 05/2015 MV: low risk; i. 08/2015 Cath: patent grafts, patent RCA/LAD stents, small LCX/OM->Med Rx.  . Cardiomyopathy, ischemic    a. 04/2012 Echo: EF 40-45%;  b.  08/2013 Echo: EF 45-50%, mild glob HK, mod lat/post HK, diast dysfxn, mildly dil LA, mild MR, nl RVSP; c. 01/2015 Echo: EF 40-45%, Gr 1 DD, mildly dil LA.  . Carotid arterial disease (HCC)    a. 05/2013 Carotid U/S; bilat 40-50% ICA stenosis.  . Chronic Chest Pain   . Chronic systolic CHF (congestive heart failure) (HCC)    a. 01/2015 Echo: EF 40-45%.  . Hyperlipidemia   Per [facility] records Hypertension Paroxysmal atrial fibrillation.  Past Surgical History:  Procedure Laterality Date  . CARDIAC CATHETERIZATION  05/01/2012  . CARDIAC CATHETERIZATION  04/2013   [facility];x3 stent  . CARDIAC CATHETERIZATION  01/11/15    [facility]  . CARDIAC CATHETERIZATION N/A 09/16/2015   Procedure: LEFT HEART CATH AND CORS/GRAFTS ANGIOGRAPHY;  Surgeon: Timothy J Gollan, MD;  Location: [facility];  Service: Cardiovascular;  Laterality: N/A;  . CARPAL TUNNEL RELEASE     right hand  . CATARACT EXTRACTION W/PHACO Left 09/16/2014   Procedure: CATARACT EXTRACTION PHACO AND INTRAOCULAR LENS PLACEMENT (IOC);  Surgeon: Chadwick Brasington, MD;  Location: [facility];  Service: Ophthalmology;  Laterality: Left;  . COLONOSCOPY    . COLONOSCOPY WITH PROPOFOL N/A 01/28/2016   Procedure: COLONOSCOPY WITH PROPOFOL;  Surgeon: Martin U Skulskie, MD;  Location: [facility];  Service: Endoscopy;  Laterality: N/A;  . CORONARY ANGIOPLASTY WITH STENT PLACEMENT  04/13/2014  . CORONARY ARTERY BYPASS GRAFT  06-26-14  . DE QUERVAIN'S RELEASE Left 08/22/2012  . ESOPHAGOGASTRODUODENOSCOPY (  EGD) WITH PROPOFOL N/A 04/26/2015   Procedure: ESOPHAGOGASTRODUODENOSCOPY (EGD) WITH PROPOFOL;  Surgeon: Paul Y Oh, MD;  Location: [facility];  Service: Gastroenterology;  Laterality: N/A;  . right shoulder    . TONSILLECTOMY      Family History  Problem Relation Age of Onset  . Heart attack Father 68    MI  . Cancer Maternal Uncle     Social History Social History  Substance Use Topics  . Smoking status: Former Smoker    Packs/day:  1.00    Years: 35.00    Types: Cigarettes    Start date: 05/02/2009  . Smokeless tobacco: Never Used     Comment: stopped 10 yrs ago  . Alcohol use No    Current Outpatient Prescriptions  Medication Sig Dispense Refill  . apixaban (ELIQUIS) 5 MG TABS tablet Take 5 mg by mouth 2 (two) times daily.    . atorvastatin (LIPITOR) 80 MG tablet Take 80 mg by mouth at bedtime.    . carvedilol (COREG) 3.125 MG tablet Take 3.125 mg by mouth daily.    . isosorbide mononitrate (IMDUR) 60 MG 24 hr tablet Take 1 tablet (60 mg total) by mouth daily. 90 tablet 3  . ramipril (ALTACE) 2.5 MG capsule Take 1 capsule (2.5 mg total) by mouth daily. 90 capsule 3  . zolpidem (AMBIEN) 5 MG tablet Take 5 mg by mouth at bedtime as needed for sleep.    . nitroGLYCERIN (NITROSTAT) 0.4 MG SL tablet Place 1 tablet (0.4 mg total) under the tongue every 5 (five) minutes as needed for chest pain. (Patient not taking: Reported on 03/21/2016) 25 tablet 6   No current facility-administered medications for this visit.     Allergies  Allergen Reactions  . Contrast Media [Iodinated Diagnostic Agents] Hives  . Oxycodone Hcl Itching  . Tramadol Itching    Review of Systems  Constitutional: Negative for activity change, appetite change, chills and fever.  HENT: Negative for trouble swallowing and voice change.   Eyes: Negative for visual disturbance.  Respiratory: Positive for cough. Negative for shortness of breath.   Cardiovascular: Positive for chest pain. Negative for leg swelling.  Gastrointestinal: Negative for abdominal pain and blood in stool.  Genitourinary: Negative for difficulty urinating and dysuria.  Neurological: Positive for dizziness. Negative for syncope and weakness.  Hematological: Bruises/bleeds easily (on Eliquis).  All other systems reviewed and are negative.   BP 129/76 (BP Location: Right Arm, Patient Position: Sitting, Cuff Size: Large)   Pulse 64   Resp 16   Ht 5' 10" (1.778 m)   Wt 177 lb  (80.3 kg)   SpO2 98% Comment: ON RA  BMI 25.40 kg/m  Physical Exam  Constitutional: He is oriented to person, place, and time. He appears well-developed and well-nourished.  HENT:  Head: Normocephalic and atraumatic.  Mouth/Throat: No oropharyngeal exudate.  Eyes: Conjunctivae and EOM are normal. No scleral icterus.  Neck: Neck supple. No thyromegaly present.  Cardiovascular: Normal rate, regular rhythm, normal heart sounds and intact distal pulses.   No murmur heard. Pulmonary/Chest: Effort normal and breath sounds normal. He has no wheezes. He has no rales.  Exquisitely tender to palpation over mid portion of sternum over a wire, moderately tender elsewhere. Sternum stable  Abdominal: Soft. He exhibits no distension. There is no tenderness.  Musculoskeletal: He exhibits no edema.  Lymphadenopathy:    He has no cervical adenopathy.  Neurological: He is alert and oriented to person, place, and time. No cranial nerve   deficit. He exhibits normal muscle tone.  Skin: Skin is warm and dry.  Vitals reviewed.    Diagnostic Tests: CT CHEST WITHOUT CONTRAST  TECHNIQUE: Multidetector CT imaging of the chest was performed following the standard protocol without IV contrast.  COMPARISON:  None.  FINDINGS: Cardiovascular: The heart size is normal. Previous median sternotomy and CABG procedure. Aortic atherosclerosis noted.  Mediastinum/Nodes: No mediastinal or hilar adenopathy. The trachea appears patent and midline. The esophagus appears normal.  Lungs/Pleura: No pleural effusion. Mild changes of centrilobular emphysema. Postinflammatory scarring noted within the lateral right lung base. No suspicious pulmonary nodule or mass identified.  Upper Abdomen: The adrenal glands are normal. The visualized portions of the liver are normal. The visualized portions of the spleen and adrenal glands are normal. The visualized portions of the pancreas are normal.  Musculoskeletal:  Multi level thoracic spondylosis noted. No aggressive lytic or sclerotic bone lesions.  IMPRESSION: 1. No acute cardiopulmonary abnormalities. 2. Aortic atherosclerosis.  Previous CABG procedure. 3. Mild emphysema.  No suspicious nodules identified.   Electronically Signed   By: [place]  Stroud M.D.   On: 08/10/2015 11:40 Coronary angiography:  Coronary dominance: Right  Left mainstem: Moderate size vessel that bifurcates into the LAD and left circumflex, mild diffuse disease  Left anterior descending (LAD): Large vessel that extends to the apical region, diagonal branch 2 of small size, proximal to mid LAD stent, 40 to 50% ISR, LIMA graft patent  Left circumflex (LCx): Small to moderate sized vessel with OM branch 2, occluded in its midsection, graft to a high OM/ramus is patent, ramus branch is small, OM 2 is small, severe ostial disease  Right coronary artery (RCA): Right dominant vessel with PL and PDA, most of the right coronary artery has been stented, no significant in-stent restenosis noted  Left ventriculography: Left ventricular systolic function is low normal LVEF is estimated at 50%, there is no significant mitral regurgitation , no significant aortic valve stenosis. Mild basal inferior hypokinesis  Final Conclusions:  --Patent LIMA graft to the LAD, proximal to mid LAD stent patent --Patent saphenous vein graft to the high OM/ramus --Small diffusely diseased circumflex not amenable to intervention with collaterals from left to left, right to left --RCA is patent with no significant disease   Recommendations:  Medical management recommended Etiology of his chronic chest pain is unclear Unable to exclude coronary spasm or small vessel disease There is some atypical component to his chest pain, sometimes at rest, sometimes on exertion. He has appointment with pain clinic in [place] next week Previously we have prescribed tramadol which he did not try. Certainly  chest pain has not improved despite coronary intervention such as bypass surgery and stenting.  We did again recommend long-acting nitrates, even ranexa -Anxiety component associated with his symptoms -We have previously recommended cardiac rehabilitation  Timothy Gollan 09/16/2015, 1:09 PM  NUCLEAR MEDICINE BONE LIMITED  TECHNIQUE: After intravenous administration of radiopharmaceutical, delayed planar images were obtained in multiple projections through the limited area of interest.  RADIOPHARMACEUTICALS:  22 millicuries technetium MDP  COMPARISON:  CT chest 08/10/2015  FINDINGS: Diffuse increased relative radiotracer uptake within the sternum and manubrium compared to the ribs and spine. Asymmetric uptake at the RIGHT sternal manubrial junction  IMPRESSION: Increased diffuse uptake within the manubrium and sternum. Favor chronic reactive bone or nonunion rather than bone infection although this could have similar appearance.   Electronically Signed   By: Stewart  Edmunds M.D.   On: 02/23/2016 16:09 I personally reviewed   the CT from July and the bone scan. I concur with the findings noted above.  Impression: [name] is a 75-year-old man with coronary artery disease who had coronary bypass grafting 2 [facility] back in June 2016. He has been having pain issues for well over a year now. They may go back completely to the time of surgery but he is a little unclear with his history. The pain that he mostly describes to me today is a very superficial pain often positional but not exertional in the sense of angina. He has exquisitely tender to touch over the midportion of the sternum and slightly less so above and below that. Point of maximal tenderness is directly over a sternal wire.  I suspect that his pain is probably from his sternal wires and the first step would be to remove those. He may have an underlying nonunion but I don't feel any significant motion on exam. Given  that his pain may be related to hardware I don't want to go directly to plating the sternum and bleeding even more hardware in that he has already. I can't rule out the possibility he may need sternal plating if he doesn't respond to removal of the wires.  I'll not really sure what to make of the bone scan findings. He may have some chronic inflammation. That does not correspond to the area where he is maximally tender. We can get a better sense of the quality of the bone healing when we remove the wires.  I recommended sternal wire removal. I would do this under general anesthesia as he is both exquisitely tender and it would be a very large field to try to manage with local anesthetic. This would be a brief procedure. He has wife understand that no guarantee can be given that his pain will resolve after removal of the sternal wires. We discussed the indications, risks, benefits, and alternatives. He understands the risks include, but are not limited to wound infection, minor bleeding, blood clots, MI, stroke, as well as the possibility of other unforeseeable complications. Overall I think the risk of this is very low. He was able to stop his Eliquis for several days prior to his colonoscopy. I think holding it for 2 days is more than sufficient for sternal wire removal. He'll be able to start it back the next day.  Plan: Stop Eliquis on 03/28/2016  Sternal wire removal on 38  Jaquanna Ballentine C Leticia Mcdiarmid, MD [facility] [phone] 

## 2016-03-28 ENCOUNTER — Encounter (HOSPITAL_COMMUNITY)
Admission: RE | Admit: 2016-03-28 | Discharge: 2016-03-28 | Disposition: A | Discharge status: Home / Self Care | Payer: Medicare HMO | Source: Ambulatory Visit | Attending: Thoracic Surgery (Cardiothoracic Vascular Surgery) | Admitting: Thoracic Surgery (Cardiothoracic Vascular Surgery)

## 2016-03-28 ENCOUNTER — Encounter (HOSPITAL_COMMUNITY): Payer: Self-pay

## 2016-03-28 DIAGNOSIS — I700 Atherosclerosis of aorta: Secondary | ICD-10-CM | POA: Diagnosis not present

## 2016-03-28 DIAGNOSIS — Z951 Presence of aortocoronary bypass graft: Secondary | ICD-10-CM | POA: Diagnosis not present

## 2016-03-28 DIAGNOSIS — Z87891 Personal history of nicotine dependence: Secondary | ICD-10-CM | POA: Diagnosis not present

## 2016-03-28 DIAGNOSIS — J439 Emphysema, unspecified: Secondary | ICD-10-CM | POA: Diagnosis not present

## 2016-03-28 DIAGNOSIS — Z79899 Other long term (current) drug therapy: Secondary | ICD-10-CM | POA: Diagnosis not present

## 2016-03-28 DIAGNOSIS — I5022 Chronic systolic (congestive) heart failure: Secondary | ICD-10-CM | POA: Diagnosis not present

## 2016-03-28 DIAGNOSIS — I480 Paroxysmal atrial fibrillation: Secondary | ICD-10-CM | POA: Diagnosis not present

## 2016-03-28 DIAGNOSIS — I255 Ischemic cardiomyopathy: Secondary | ICD-10-CM | POA: Diagnosis not present

## 2016-03-28 DIAGNOSIS — I739 Peripheral vascular disease, unspecified: Secondary | ICD-10-CM | POA: Diagnosis not present

## 2016-03-28 DIAGNOSIS — I2510 Atherosclerotic heart disease of native coronary artery without angina pectoris: Secondary | ICD-10-CM | POA: Diagnosis not present

## 2016-03-28 DIAGNOSIS — R0789 Other chest pain: Secondary | ICD-10-CM

## 2016-03-28 DIAGNOSIS — E785 Hyperlipidemia, unspecified: Secondary | ICD-10-CM | POA: Diagnosis not present

## 2016-03-28 DIAGNOSIS — I110 Hypertensive heart disease with heart failure: Secondary | ICD-10-CM | POA: Diagnosis not present

## 2016-03-28 DIAGNOSIS — R918 Other nonspecific abnormal finding of lung field: Secondary | ICD-10-CM | POA: Diagnosis not present

## 2016-03-28 DIAGNOSIS — R072 Precordial pain: Secondary | ICD-10-CM | POA: Diagnosis not present

## 2016-03-28 DIAGNOSIS — Z7901 Long term (current) use of anticoagulants: Secondary | ICD-10-CM | POA: Diagnosis not present

## 2016-03-28 HISTORY — DX: Acute myocardial infarction, unspecified: I21.9

## 2016-03-28 HISTORY — DX: Headache: R51

## 2016-03-28 HISTORY — DX: Personal history of other medical treatment: Z92.89

## 2016-03-28 HISTORY — DX: Essential (primary) hypertension: I10

## 2016-03-28 HISTORY — DX: Unspecified osteoarthritis, unspecified site: M19.90

## 2016-03-28 HISTORY — DX: Dyspnea, unspecified: R06.00

## 2016-03-28 HISTORY — DX: Headache, unspecified: R51.9

## 2016-03-28 LAB — CBC
HCT: 43.1 % (ref 39.0–52.0)
Hemoglobin: 14 g/dL (ref 13.0–17.0)
MCH: 28.5 pg (ref 26.0–34.0)
MCHC: 32.5 g/dL (ref 30.0–36.0)
MCV: 87.8 fL (ref 78.0–100.0)
PLATELETS: 223 10*3/uL (ref 150–400)
RBC: 4.91 MIL/uL (ref 4.22–5.81)
RDW: 14.4 % (ref 11.5–15.5)
WBC: 6.4 10*3/uL (ref 4.0–10.5)

## 2016-03-28 LAB — COMPREHENSIVE METABOLIC PANEL
ALBUMIN: 3.6 g/dL (ref 3.5–5.0)
ALT: 21 U/L (ref 17–63)
AST: 23 U/L (ref 15–41)
Alkaline Phosphatase: 49 U/L (ref 38–126)
Anion gap: 6 (ref 5–15)
BUN: 16 mg/dL (ref 6–20)
CO2: 27 mmol/L (ref 22–32)
CREATININE: 1.14 mg/dL (ref 0.61–1.24)
Calcium: 8.8 mg/dL — ABNORMAL LOW (ref 8.9–10.3)
Chloride: 106 mmol/L (ref 101–111)
GFR calc Af Amer: >60 mL/min (ref >60)
GLUCOSE: 99 mg/dL (ref 65–99)
Potassium: 4.1 mmol/L (ref 3.5–5.1)
Sodium: 139 mmol/L (ref 135–145)
Total Bilirubin: 0.8 mg/dL (ref 0.3–1.2)
Total Protein: 6.3 g/dL — ABNORMAL LOW (ref 6.5–8.1)

## 2016-03-28 LAB — TYPE AND SCREEN
ABO/RH(D): A POS
Antibody Screen: NEGATIVE

## 2016-03-28 LAB — PROTIME-INR
INR: 1.19
PROTHROMBIN TIME: 15.1 s (ref 11.4–15.2)

## 2016-03-28 LAB — ABO/RH: ABO/RH(D): A POS

## 2016-03-28 LAB — SURGICAL PCR SCREEN
MRSA, PCR: NEGATIVE
STAPHYLOCOCCUS AUREUS: NEGATIVE

## 2016-03-28 LAB — APTT: APTT: 34 s (ref 24–36)

## 2016-03-28 NOTE — Progress Notes (Signed)
Pt. Reports that Dr. Roxan Hockey instructed him to stop Eliquis as of today. Pt. Being followed by Dr. Arta Bruce for cardiac, Dr. Gilford Rile with [facility] for PCP. Pt. Continues to report pain in his chest region, that has been entirely worked up & has been told by pain mgt. Dr. Hardin Negus that the pain is related to the sternal wires; hence moving forward with surgery to remove them.

## 2016-03-28 NOTE — Pre-Procedure Instructions (Signed)
Scott [name]  03/28/2016      [place] [place] [address] [place] [address] Phone: [phone] Fax: [place] [place] [address] Phone: [phone] Fax: [phone]    Your procedure is scheduled on 03/30/2016  Report to [facility] Admitting at 0800 A.M.  Call this number if you have problems the morning of surgery:  [phone]   Remember:  Do not eat food or drink liquids after midnight.   Take these medicines the morning of surgery with A SIP OF WATER : Carvedilol, Isosorbide    Do not wear jewelry   Do not wear lotions, powders, or perfumes, or deoderant.     Men may shave face and neck.   Do not bring valuables to the hospital.   [facility] is not responsible for any belongings or valuables.  Contacts, dentures or bridgework may not be worn into surgery.  Leave your suitcase in the car.  After surgery it may be brought to your room.  For patients admitted to the hospital, discharge time will be determined by your treatment team.  Patients discharged the day of surgery will not be allowed to drive home.   Name and phone number of your driver:   . Wife   Special instructions: Special Instructions: [place] - Preparing for Surgery  Before surgery, you can play an important role.  Because skin is not sterile, your skin needs to be as free of germs as possible.  You can reduce the number of germs on you skin by washing with CHG (chlorahexidine gluconate) soap before surgery.  CHG is an antiseptic cleaner which kills germs and bonds with the skin to continue killing germs even after washing.  Please DO NOT use if you have an allergy to CHG or antibacterial soaps.  If your skin becomes reddened/irritated stop using the CHG and inform your nurse when you arrive at Short Stay.  Do not shave (including legs and  underarms) for at least 48 hours prior to the first CHG shower.  You may shave your face.  Please follow these instructions carefully:   1.  Shower with CHG Soap the night before surgery and the  morning of Surgery.  2.  If you choose to wash your hair, wash your hair first as usual with your  normal shampoo.  3.  After you shampoo, rinse your hair and body thoroughly to remove the  Shampoo.  4.  Use CHG as you would any other liquid soap.  You can apply chg directly to the skin and wash gently with scrungie or a clean washcloth.  5.  Apply the CHG Soap to your body ONLY FROM THE NECK DOWN.    Do not use on open wounds or open sores.  Avoid contact with your eyes, ears, mouth and genitals (private parts).  Wash genitals (private parts)   with your normal soap.  6.  Wash thoroughly, paying special attention to the area where your surgery will be performed.  7.  Thoroughly rinse your body with warm water from the neck down.  8.  DO NOT shower/wash with your normal soap after using and rinsing off   the CHG Soap.  9.  Pat yourself dry with a clean towel.            10.  Wear clean pajamas.  11.  Place clean sheets on your bed the night of your first shower and do not sleep with pets.  Day of Surgery  Do not apply any lotions/deodorants the morning of surgery.  Please wear clean clothes to the hospital/surgery center.  Please read over the following fact sheets that you were given. Pain Booklet, Coughing and Deep Breathing, MRSA Information and Surgical Site Infection Prevention

## 2016-03-29 NOTE — Progress Notes (Signed)
Anesthesia chart review: Patient is a 76 year old male scheduled for sternal wire removal on 03/30/2016 by Dr. Roxan Hockey.  History includes MI, CAD s/p DES RCA 04/2013 and DES LAD 03/2014 and CABG ([facility]) LIMA-LAD, SVG-OM 06/2014, ischemic cardiomyopathy, carotid arterial disease, chronic systolic CHF, chronic chest pain, hyperlipidemia, hypertension, paroxysmal atrial fibrillation, former smoker, migraines, tonsillectomy.   PCP is Dr. Lisette Grinder [facility]). Cardiologist is Dr. [name]. Pulmonologist is Dr. Wallene Huh [facility]).  Meds include Eliquis (last dose 03/27/16), Lipitor, Coreg, Imdur, nitroglycerin, ramipril, Ambien.  BP 117/66   Pulse 67   Temp 36.4 C   Resp 18   Ht 5\' 10"  (1.778 m)   Wt 175 lb 9.6 oz (79.7 kg)   SpO2 100%   BMI 25.20 kg/m   EKG 03/28/2016: Normal sinus rhythm, nonspecific intraventricular block, inferior infarct, age undetermined. No significant change since last tracing.  Cardiac cath 09/16/15:  Dist Cx lesion, 100 %stenosed.  2nd Mrg lesion, 90 %stenosed.  Mid LAD lesion, 40 %stenosed.  Ost RCA to Prox RCA lesion, 10 %stenosed.  Prox RCA to Mid RCA lesion, 10 %stenosed.  Mid RCA to Dist RCA lesion, 10 %stenosed.  SVG and is normal in caliber and anatomically normal.  LIMA and is normal in caliber and anatomically normal.  The left ventricular ejection fraction is 50-55% by visual estimate. Final Conclusions:  --Patent LIMA graft to the LAD, proximal to mid LAD stent patent --Patent saphenous vein graft to the high OM/ramus --Small diffusely diseased circumflex not amenable to intervention with collaterals from left to left, right to left --RCA is patent with no significant disease Recommendations:  Medical management recommended Etiology of his chronic chest pain is unclear Unable to exclude coronary spasm or small vessel disease There is some atypical component to his chest pain, sometimes at rest, sometimes on exertion. He  has appointment with pain clinic in [place] next week Previously we have prescribed tramadol which he did not try. Certainly chest pain has not improved despite coronary intervention such as bypass surgery and stenting.  We did again recommend long-acting nitrates, even ranexa -Anxiety component associated with his symptoms -We have previously recommended cardiac rehabilitation.  CXR 03/28/16: IMPRESSION: No active cardiopulmonary disease. Status post median sternotomy and CABG.  Echo 02/08/15: Study Conclusions - Procedure narrative: Transthoracic echocardiography. Image   quality was poor. The study was technically difficult, as a   result of poor acoustic windows and poor sound wave transmission. - Left ventricle: The cavity size was moderately dilated. Systolic   function was mildly to moderately reduced. The estimated ejection   fraction was in the range of 40% to 45%. Doppler parameters are   consistent with abnormal left ventricular relaxation (grade 1   diastolic dysfunction). - Left atrium: The atrium was mildly dilated.  Carotid U/S 06/03/13: Impression: 40-59% BICA stenosis. Patent vertebral arteries with antegrade flow. Normal subclavian arteries bilaterally.  Bone Scan (Limited) 02/23/16: IMPRESSION: Increased diffuse uptake within the manubrium and sternum. Favor chronic reactive bone or nonunion rather than bone infection although this could have similar appearance.  Preoperative labs noted.  He has chronic chest pain with recent negative cardiac work-up including LHC. Dr. Roxan Hockey felt pain probably from sternal wire. If no acute changes then I anticipate that he can proceed as planned.  George Hugh [facility] Short Stay Center/Anesthesiology Phone [phone] 03/29/2016 9:50 AM

## 2016-03-30 ENCOUNTER — Encounter (HOSPITAL_COMMUNITY): Payer: Self-pay | Admitting: *Deleted

## 2016-03-30 ENCOUNTER — Ambulatory Visit (HOSPITAL_COMMUNITY)
Admission: RE | Admit: 2016-03-30 | Discharge: 2016-03-30 | Disposition: A | Discharge status: Home / Self Care | Payer: Medicare HMO | Source: Ambulatory Visit | Attending: Thoracic Surgery (Cardiothoracic Vascular Surgery) | Admitting: Thoracic Surgery (Cardiothoracic Vascular Surgery)

## 2016-03-30 ENCOUNTER — Encounter (HOSPITAL_COMMUNITY)
Admission: RE | Disposition: A | Discharge status: Home / Self Care | Payer: Self-pay | Source: Ambulatory Visit | Attending: Thoracic Surgery (Cardiothoracic Vascular Surgery)

## 2016-03-30 ENCOUNTER — Ambulatory Visit (HOSPITAL_COMMUNITY): Payer: Medicare HMO | Admitting: Certified Registered"

## 2016-03-30 ENCOUNTER — Ambulatory Visit (HOSPITAL_COMMUNITY): Payer: Medicare HMO | Admitting: Vascular Surgery

## 2016-03-30 DIAGNOSIS — J439 Emphysema, unspecified: Secondary | ICD-10-CM | POA: Insufficient documentation

## 2016-03-30 DIAGNOSIS — I110 Hypertensive heart disease with heart failure: Secondary | ICD-10-CM | POA: Diagnosis not present

## 2016-03-30 DIAGNOSIS — R072 Precordial pain: Secondary | ICD-10-CM | POA: Insufficient documentation

## 2016-03-30 DIAGNOSIS — Z951 Presence of aortocoronary bypass graft: Secondary | ICD-10-CM | POA: Insufficient documentation

## 2016-03-30 DIAGNOSIS — E785 Hyperlipidemia, unspecified: Secondary | ICD-10-CM | POA: Insufficient documentation

## 2016-03-30 DIAGNOSIS — I255 Ischemic cardiomyopathy: Secondary | ICD-10-CM | POA: Diagnosis not present

## 2016-03-30 DIAGNOSIS — Z7901 Long term (current) use of anticoagulants: Secondary | ICD-10-CM | POA: Insufficient documentation

## 2016-03-30 DIAGNOSIS — Z87891 Personal history of nicotine dependence: Secondary | ICD-10-CM | POA: Insufficient documentation

## 2016-03-30 DIAGNOSIS — I480 Paroxysmal atrial fibrillation: Secondary | ICD-10-CM | POA: Diagnosis not present

## 2016-03-30 DIAGNOSIS — I5022 Chronic systolic (congestive) heart failure: Secondary | ICD-10-CM | POA: Diagnosis not present

## 2016-03-30 DIAGNOSIS — I2510 Atherosclerotic heart disease of native coronary artery without angina pectoris: Secondary | ICD-10-CM | POA: Diagnosis not present

## 2016-03-30 DIAGNOSIS — T8484XA Pain due to internal orthopedic prosthetic devices, implants and grafts, initial encounter: Secondary | ICD-10-CM | POA: Diagnosis not present

## 2016-03-30 DIAGNOSIS — R0789 Other chest pain: Secondary | ICD-10-CM

## 2016-03-30 DIAGNOSIS — I739 Peripheral vascular disease, unspecified: Secondary | ICD-10-CM | POA: Insufficient documentation

## 2016-03-30 DIAGNOSIS — I25810 Atherosclerosis of coronary artery bypass graft(s) without angina pectoris: Secondary | ICD-10-CM | POA: Diagnosis not present

## 2016-03-30 DIAGNOSIS — Z79899 Other long term (current) drug therapy: Secondary | ICD-10-CM | POA: Insufficient documentation

## 2016-03-30 DIAGNOSIS — I700 Atherosclerosis of aorta: Secondary | ICD-10-CM | POA: Insufficient documentation

## 2016-03-30 HISTORY — PX: STERNAL WIRES REMOVAL: SHX2441

## 2016-03-30 SURGERY — REMOVAL, STERNAL WIRE
Anesthesia: General | Site: Chest

## 2016-03-30 MED ORDER — SUGAMMADEX SODIUM 500 MG/5ML IV SOLN
INTRAVENOUS | Status: DC | PRN
  Administered 2016-03-30: 300 mg via INTRAVENOUS

## 2016-03-30 MED ORDER — FENTANYL CITRATE (PF) 100 MCG/2ML IJ SOLN
INTRAMUSCULAR | Status: DC | PRN
  Administered 2016-03-30 (×2): 50 ug via INTRAVENOUS
  Administered 2016-03-30: 100 ug via INTRAVENOUS

## 2016-03-30 MED ORDER — MIDAZOLAM HCL 5 MG/5ML IJ SOLN
INTRAMUSCULAR | Status: DC | PRN
  Administered 2016-03-30: 1 mg via INTRAVENOUS

## 2016-03-30 MED ORDER — LIDOCAINE HCL (CARDIAC) 20 MG/ML IV SOLN
INTRAVENOUS | Status: DC | PRN
  Administered 2016-03-30: 60 mg via INTRAVENOUS

## 2016-03-30 MED ORDER — TRAMADOL HCL 50 MG PO TABS: 50.0000 mg | ORAL_TABLET | Freq: Four times a day (QID) | ORAL | Status: DC | PRN

## 2016-03-30 MED ORDER — ROCURONIUM BROMIDE 100 MG/10ML IV SOLN
INTRAVENOUS | Status: DC | PRN
  Administered 2016-03-30: 40 mg via INTRAVENOUS

## 2016-03-30 MED ORDER — PROPOFOL 10 MG/ML IV BOLUS
INTRAVENOUS | Status: AC
  Filled 2016-03-30: qty 20

## 2016-03-30 MED ORDER — EPHEDRINE SULFATE 50 MG/ML IJ SOLN
INTRAMUSCULAR | Status: DC | PRN
  Administered 2016-03-30: 5 mg via INTRAVENOUS
  Administered 2016-03-30: 10 mg via INTRAVENOUS

## 2016-03-30 MED ORDER — FENTANYL CITRATE (PF) 100 MCG/2ML IJ SOLN
25.0000 ug | INTRAMUSCULAR | Status: DC | PRN
  Administered 2016-03-30 (×2): 50 ug via INTRAVENOUS

## 2016-03-30 MED ORDER — FENTANYL CITRATE (PF) 250 MCG/5ML IJ SOLN
INTRAMUSCULAR | Status: AC
  Filled 2016-03-30: qty 5

## 2016-03-30 MED ORDER — PHENYLEPHRINE HCL 10 MG/ML IJ SOLN
INTRAMUSCULAR | Status: DC | PRN
Start: 2016-03-30 — End: 2016-03-30
  Administered 2016-03-30: 80 ug via INTRAVENOUS

## 2016-03-30 MED ORDER — LACTATED RINGERS IV SOLN
INTRAVENOUS | Status: DC | PRN
  Administered 2016-03-30 (×2): via INTRAVENOUS

## 2016-03-30 MED ORDER — FENTANYL CITRATE (PF) 100 MCG/2ML IJ SOLN
INTRAMUSCULAR | Status: AC
  Administered 2016-03-30: 50 ug via INTRAVENOUS
  Filled 2016-03-30: qty 2

## 2016-03-30 MED ORDER — TRAMADOL HCL 50 MG PO TABS: 50.0000 mg | ORAL_TABLET | Freq: Four times a day (QID) | ORAL | 0 refills | Status: DC | PRN

## 2016-03-30 MED ORDER — 0.9 % SODIUM CHLORIDE (POUR BTL) OPTIME
TOPICAL | Status: DC | PRN
  Administered 2016-03-30: 1000 mL

## 2016-03-30 MED ORDER — PROPOFOL 10 MG/ML IV BOLUS
INTRAVENOUS | Status: DC | PRN
  Administered 2016-03-30: 100 mg via INTRAVENOUS

## 2016-03-30 MED ORDER — ONDANSETRON HCL 4 MG/2ML IJ SOLN
INTRAMUSCULAR | Status: DC | PRN
  Administered 2016-03-30: 4 mg via INTRAVENOUS

## 2016-03-30 MED ORDER — DEXTROSE 5 % IV SOLN
1.5000 g | INTRAVENOUS | Status: AC
  Administered 2016-03-30: 1.5 g via INTRAVENOUS
  Filled 2016-03-30: qty 1.5

## 2016-03-30 MED ORDER — MIDAZOLAM HCL 2 MG/2ML IJ SOLN
INTRAMUSCULAR | Status: AC
  Filled 2016-03-30: qty 2

## 2016-03-30 SURGICAL SUPPLY — 42 items
BLADE SURG 10 STRL SS (BLADE) IMPLANT
CANISTER SUCT 3000ML PPV (MISCELLANEOUS) ×2 IMPLANT
CLIP TI MEDIUM 6 (CLIP) ×2 IMPLANT
CLIP TI WIDE RED SMALL 6 (CLIP) ×2 IMPLANT
CONT SPEC 4OZ CLIKSEAL STRL BL (MISCELLANEOUS) ×4 IMPLANT
DRAPE INCISE IOBAN 66X45 STRL (DRAPES) IMPLANT
DRAPE LAPAROSCOPIC ABDOMINAL (DRAPES) ×2 IMPLANT
DRAPE SLUSH/WARMER DISC (DRAPES) ×2 IMPLANT
DRSG COVADERM 4X14 (GAUZE/BANDAGES/DRESSINGS) ×2 IMPLANT
ELECT REM PT RETURN 9FT ADLT (ELECTROSURGICAL) ×2
ELECTRODE REM PT RTRN 9FT ADLT (ELECTROSURGICAL) ×1 IMPLANT
GAUZE SPONGE 4X4 12PLY STRL (GAUZE/BANDAGES/DRESSINGS) ×2 IMPLANT
GLOVE EUDERMIC 7 POWDERFREE (GLOVE) ×2 IMPLANT
GOWN STRL REUS W/ TWL LRG LVL3 (GOWN DISPOSABLE) ×1 IMPLANT
GOWN STRL REUS W/ TWL XL LVL3 (GOWN DISPOSABLE) ×1 IMPLANT
GOWN STRL REUS W/TWL LRG LVL3 (GOWN DISPOSABLE) ×1
GOWN STRL REUS W/TWL XL LVL3 (GOWN DISPOSABLE) ×1
HEMOSTAT POWDER SURGIFOAM 1G (HEMOSTASIS) IMPLANT
KIT BASIN OR (CUSTOM PROCEDURE TRAY) ×2 IMPLANT
KIT ROOM TURNOVER OR (KITS) ×2 IMPLANT
NEEDLE 25GX 5/8IN NON SAFETY (NEEDLE) IMPLANT
NS IRRIG 1000ML POUR BTL (IV SOLUTION) ×2 IMPLANT
PACK CHEST (CUSTOM PROCEDURE TRAY) ×2 IMPLANT
PAD ARMBOARD 7.5X6 YLW CONV (MISCELLANEOUS) ×4 IMPLANT
SPONGE GAUZE 4X4 12PLY STER LF (GAUZE/BANDAGES/DRESSINGS) ×2 IMPLANT
SPONGE LAP 18X18 X RAY DECT (DISPOSABLE) IMPLANT
SUT STEEL 6MS V (SUTURE) IMPLANT
SUT STEEL STERNAL CCS#1 18IN (SUTURE) IMPLANT
SUT STEEL SZ 6 DBL 3X14 BALL (SUTURE) IMPLANT
SUT VIC AB 1 CTX 36 (SUTURE) ×1
SUT VIC AB 1 CTX36XBRD ANBCTR (SUTURE) ×1 IMPLANT
SUT VIC AB 2-0 CTX 27 (SUTURE) ×4 IMPLANT
SUT VIC AB 2-0 SH 27 (SUTURE) ×1
SUT VIC AB 2-0 SH 27XBRD (SUTURE) ×1 IMPLANT
SUT VIC AB 3-0 X1 27 (SUTURE) ×4 IMPLANT
SWAB COLLECTION DEVICE MRSA (MISCELLANEOUS) IMPLANT
SYR CONTROL 10ML LL (SYRINGE) IMPLANT
TOWEL OR 17X24 6PK STRL BLUE (TOWEL DISPOSABLE) IMPLANT
TOWEL OR 17X26 10 PK STRL BLUE (TOWEL DISPOSABLE) ×2 IMPLANT
TRAY FOLEY CATH 14FRSI W/METER (CATHETERS) IMPLANT
TUBE ANAEROBIC SPECIMEN COL (MISCELLANEOUS) IMPLANT
WATER STERILE IRR 1000ML POUR (IV SOLUTION) ×2 IMPLANT

## 2016-03-30 NOTE — H&P (View-Only) (Signed)
PCP is Madelyn Brunner, MD Referring Provider is Nicholaus Bloom, MD  Chief Complaint  Patient presents with  . Referral    from Dr. Hardin Negus, pain management, to r/o sternal nonunion...s/p CABG 2 YS AGO AT [facility]...CT CHEST 08/10/15, BONE SCAN 02/23/16  . Chest Pain    CHRONIC PAIN SYNDROME    HPI: Mr. Loden is a 75 year old gentleman sent for consultation regarding sternal pain.  [name] is a 76 year old gentleman with a history of coronary artery disease, ischemic cardiomyopathy, carotid arterial disease, chronic systolic congestive heart failure, and hyperlipidemia. He underwent coronary bypass grafting 2 at [facility] in June 2016. He says he had what he thought were normal levels of pain postoperatively and then his pain improved for a while. About a year ago he began having severe chest discomfort usually related to activities such as bending or lifting heavy objects. Per his notes he had 2 separate pain syndromes one was more consistent with angina with some shortness of breath and radiation to the left shoulder. The other was a sharp pain in the sternum. He was seen multiple times back at [facility] and had extensive workup including cardiac catheterization back in August 2017. He says he was told everything was okay on the catheterization. He is on Eliquis. He was able stop that for 3 days prior to colonoscopy. He has tried various medications none of which provide any significant pain relief.   Past Medical History:  Diagnosis Date  . CAD (coronary artery disease)    a. 04/2012 Cath: 3VD->Med Rx;  b. 04/2013 PCI RCA (2 DES); c. 03/2014 PCI: LAD 80 (3.0x23 Xience Alpine DES), 30 ISR in RCA; d. 06/2014 CABG x 2 ([facility]) LIMA->LAD, VG->OM; e. 11/2014 Neg MV;  f. 12/2014 Cath ([facility]): patent grafts->Med Rx; g. 03/2015 MV ([facility]) low risk, EF 40%; h. 05/2015 MV: low risk; i. 08/2015 Cath: patent grafts, patent RCA/LAD stents, small LCX/OM->Med Rx.  . Cardiomyopathy, ischemic    a. 04/2012 Echo: EF 40-45%;  b.  08/2013 Echo: EF 45-50%, mild glob HK, mod lat/post HK, diast dysfxn, mildly dil LA, mild MR, nl RVSP; c. 01/2015 Echo: EF 40-45%, Gr 1 DD, mildly dil LA.  . Carotid arterial disease ([place])    a. 05/2013 Carotid U/S; bilat 40-50% ICA stenosis.  . Chronic Chest Pain   . Chronic systolic CHF (congestive heart failure) ([place])    a. 01/2015 Echo: EF 40-45%.  . Hyperlipidemia   Per  [facility] records Hypertension Paroxysmal atrial fibrillation.  Past Surgical History:  Procedure Laterality Date  . CARDIAC CATHETERIZATION  05/01/2012  . CARDIAC CATHETERIZATION  04/2013   [facility];x3 stent  . CARDIAC CATHETERIZATION  01/11/15    [facility]  . CARDIAC CATHETERIZATION N/A 09/16/2015   Procedure: LEFT HEART CATH AND CORS/GRAFTS ANGIOGRAPHY;  Surgeon: Minna Merritts, MD;  Location: [place] CV LAB;  Service: Cardiovascular;  Laterality: N/A;  . CARPAL TUNNEL RELEASE     right hand  . CATARACT EXTRACTION W/PHACO Left 09/16/2014   Procedure: CATARACT EXTRACTION PHACO AND INTRAOCULAR LENS PLACEMENT (IOC);  Surgeon: Leandrew Koyanagi, MD;  Location: [place];  Service: Ophthalmology;  Laterality: Left;  . COLONOSCOPY    . COLONOSCOPY WITH PROPOFOL N/A 01/28/2016   Procedure: COLONOSCOPY WITH PROPOFOL;  Surgeon: Lollie Sails, MD;  Location: [facility] ENDOSCOPY;  Service: Endoscopy;  Laterality: N/A;  . CORONARY ANGIOPLASTY WITH STENT PLACEMENT  04/13/2014  . CORONARY ARTERY BYPASS GRAFT  06-26-14  . DE QUERVAIN'S RELEASE Left 08/22/2012  . ESOPHAGOGASTRODUODENOSCOPY (  EGD) WITH PROPOFOL N/A 04/26/2015   Procedure: ESOPHAGOGASTRODUODENOSCOPY (EGD) WITH PROPOFOL;  Surgeon: Hulen Luster, MD;  Location: [facility] ENDOSCOPY;  Service: Gastroenterology;  Laterality: N/A;  . right shoulder    . TONSILLECTOMY      Family History  Problem Relation Age of Onset  . Heart attack Father 7    MI  . Cancer Maternal Uncle     Social History Social History  Substance Use Topics  . Smoking status: Former Smoker    Packs/day:  1.00    Years: 35.00    Types: Cigarettes    Start date: 05/02/2009  . Smokeless tobacco: Never Used     Comment: stopped 10 yrs ago  . Alcohol use No    Current Outpatient Prescriptions  Medication Sig Dispense Refill  . apixaban (ELIQUIS) 5 MG TABS tablet Take 5 mg by mouth 2 (two) times daily.    Marland Kitchen atorvastatin (LIPITOR) 80 MG tablet Take 80 mg by mouth at bedtime.    . carvedilol (COREG) 3.125 MG tablet Take 3.125 mg by mouth daily.    . isosorbide mononitrate (IMDUR) 60 MG 24 hr tablet Take 1 tablet (60 mg total) by mouth daily. 90 tablet 3  . ramipril (ALTACE) 2.5 MG capsule Take 1 capsule (2.5 mg total) by mouth daily. 90 capsule 3  . zolpidem (AMBIEN) 5 MG tablet Take 5 mg by mouth at bedtime as needed for sleep.    . nitroGLYCERIN (NITROSTAT) 0.4 MG SL tablet Place 1 tablet (0.4 mg total) under the tongue every 5 (five) minutes as needed for chest pain. (Patient not taking: Reported on 03/21/2016) 25 tablet 6   No current facility-administered medications for this visit.     Allergies  Allergen Reactions  . Contrast Media [Iodinated Diagnostic Agents] Hives  . Oxycodone Hcl Itching  . Tramadol Itching    Review of Systems  Constitutional: Negative for activity change, appetite change, chills and fever.  HENT: Negative for trouble swallowing and voice change.   Eyes: Negative for visual disturbance.  Respiratory: Positive for cough. Negative for shortness of breath.   Cardiovascular: Positive for chest pain. Negative for leg swelling.  Gastrointestinal: Negative for abdominal pain and blood in stool.  Genitourinary: Negative for difficulty urinating and dysuria.  Neurological: Positive for dizziness. Negative for syncope and weakness.  Hematological: Bruises/bleeds easily (on Eliquis).  All other systems reviewed and are negative.   BP 129/76 (BP Location: Right Arm, Patient Position: Sitting, Cuff Size: Large)   Pulse 64   Resp 16   Ht 5\' 10"  (1.778 m)   Wt 177 lb  (80.3 kg)   SpO2 98% Comment: ON RA  BMI 25.40 kg/m  Physical Exam  Constitutional: He is oriented to person, place, and time. He appears well-developed and well-nourished.  HENT:  Head: Normocephalic and atraumatic.  Mouth/Throat: No oropharyngeal exudate.  Eyes: Conjunctivae and EOM are normal. No scleral icterus.  Neck: Neck supple. No thyromegaly present.  Cardiovascular: Normal rate, regular rhythm, normal heart sounds and intact distal pulses.   No murmur heard. Pulmonary/Chest: Effort normal and breath sounds normal. He has no wheezes. He has no rales.  Exquisitely tender to palpation over mid portion of sternum over a wire, moderately tender elsewhere. Sternum stable  Abdominal: Soft. He exhibits no distension. There is no tenderness.  Musculoskeletal: He exhibits no edema.  Lymphadenopathy:    He has no cervical adenopathy.  Neurological: He is alert and oriented to person, place, and time. No cranial nerve  deficit. He exhibits normal muscle tone.  Skin: Skin is warm and dry.  Vitals reviewed.    Diagnostic Tests: CT CHEST WITHOUT CONTRAST  TECHNIQUE: Multidetector CT imaging of the chest was performed following the standard protocol without IV contrast.  COMPARISON:  None.  FINDINGS: Cardiovascular: The heart size is normal. Previous median sternotomy and CABG procedure. Aortic atherosclerosis noted.  Mediastinum/Nodes: No mediastinal or hilar adenopathy. The trachea appears patent and midline. The esophagus appears normal.  Lungs/Pleura: No pleural effusion. Mild changes of centrilobular emphysema. Postinflammatory scarring noted within the lateral right lung base. No suspicious pulmonary nodule or mass identified.  Upper Abdomen: The adrenal glands are normal. The visualized portions of the liver are normal. The visualized portions of the spleen and adrenal glands are normal. The visualized portions of the pancreas are normal.  Musculoskeletal:  Multi level thoracic spondylosis noted. No aggressive lytic or sclerotic bone lesions.  IMPRESSION: 1. No acute cardiopulmonary abnormalities. 2. Aortic atherosclerosis.  Previous CABG procedure. 3. Mild emphysema.  No suspicious nodules identified.   Electronically Signed   By: Kerby Moors M.D.   On: 08/10/2015 11:40 Coronary angiography:  Coronary dominance: Right  Left mainstem: Moderate size vessel that bifurcates into the LAD and left circumflex, mild diffuse disease  Left anterior descending (LAD): Large vessel that extends to the apical region, diagonal branch 2 of small size, proximal to mid LAD stent, 40 to 50% ISR, LIMA graft patent  Left circumflex (LCx): Small to moderate sized vessel with OM branch 2, occluded in its midsection, graft to a high OM/ramus is patent, ramus branch is small, OM 2 is small, severe ostial disease  Right coronary artery (RCA): Right dominant vessel with PL and PDA, most of the right coronary artery has been stented, no significant in-stent restenosis noted  Left ventriculography: Left ventricular systolic function is low normal LVEF is estimated at 50%, there is no significant mitral regurgitation , no significant aortic valve stenosis. Mild basal inferior hypokinesis  Final Conclusions:  --Patent LIMA graft to the LAD, proximal to mid LAD stent patent --Patent saphenous vein graft to the high OM/ramus --Small diffusely diseased circumflex not amenable to intervention with collaterals from left to left, right to left --RCA is patent with no significant disease   Recommendations:  Medical management recommended Etiology of his chronic chest pain is unclear Unable to exclude coronary spasm or small vessel disease There is some atypical component to his chest pain, sometimes at rest, sometimes on exertion. He has appointment with pain clinic in [place] next week Previously we have prescribed tramadol which he did not try. Certainly  chest pain has not improved despite coronary intervention such as bypass surgery and stenting.  We did again recommend long-acting nitrates, even ranexa -Anxiety component associated with his symptoms -We have previously recommended cardiac rehabilitation  [name] 09/16/2015, 1:09 PM  NUCLEAR MEDICINE BONE LIMITED  TECHNIQUE: After intravenous administration of radiopharmaceutical, delayed planar images were obtained in multiple projections through the limited area of interest.  RADIOPHARMACEUTICALS:  22 millicuries technetium MDP  COMPARISON:  CT chest 08/10/2015  FINDINGS: Diffuse increased relative radiotracer uptake within the sternum and manubrium compared to the ribs and spine. Asymmetric uptake at the RIGHT sternal manubrial junction  IMPRESSION: Increased diffuse uptake within the manubrium and sternum. Favor chronic reactive bone or nonunion rather than bone infection although this could have similar appearance.   Electronically Signed   By: Suzy Bouchard M.D.   On: 02/23/2016 16:09 I personally reviewed  the CT from July and the bone scan. I concur with the findings noted above.  Impression: Mr. Gatt is a 76 year old man with coronary artery disease who had coronary bypass grafting 2 [facility] back in June 2016. He has been having pain issues for well over a year now. They may go back completely to the time of surgery but he is a little unclear with his history. The pain that he mostly describes to me today is a very superficial pain often positional but not exertional in the sense of angina. He has exquisitely tender to touch over the midportion of the sternum and slightly less so above and below that. Point of maximal tenderness is directly over a sternal wire.  I suspect that his pain is probably from his sternal wires and the first step would be to remove those. He may have an underlying nonunion but I don't feel any significant motion on exam. Given  that his pain may be related to hardware I don't want to go directly to plating the sternum and bleeding even more hardware in that he has already. I can't rule out the possibility he may need sternal plating if he doesn't respond to removal of the wires.  I'll not really sure what to make of the bone scan findings. He may have some chronic inflammation. That does not correspond to the area where he is maximally tender. We can get a better sense of the quality of the bone healing when we remove the wires.  I recommended sternal wire removal. I would do this under general anesthesia as he is both exquisitely tender and it would be a very large field to try to manage with local anesthetic. This would be a brief procedure. He has wife understand that no guarantee can be given that his pain will resolve after removal of the sternal wires. We discussed the indications, risks, benefits, and alternatives. He understands the risks include, but are not limited to wound infection, minor bleeding, blood clots, MI, stroke, as well as the possibility of other unforeseeable complications. Overall I think the risk of this is very low. He was able to stop his Eliquis for several days prior to his colonoscopy. I think holding it for 2 days is more than sufficient for sternal wire removal. He'll be able to start it back the next day.  Plan: Stop Eliquis on 03/28/2016  Sternal wire removal on 38  Melrose Nakayama, MD [facility] [phone]

## 2016-03-30 NOTE — Interval H&P Note (Signed)
History and Physical Interval Note:  03/30/2016 8:02 AM  Scott [name]  has presented today for surgery, with the diagnosis of STERNAL PAIN  The various methods of treatment have been discussed with the patient and family. After consideration of risks, benefits and other options for treatment, the patient has consented to  Procedure(s): STERNAL WIRES REMOVAL (N/A) as a surgical intervention .  The patient's history has been reviewed, patient examined, no change in status, stable for surgery.  I have reviewed the patient's chart and labs.  Questions were answered to the patient's satisfaction.     Scott [name]

## 2016-03-30 NOTE — Brief Op Note (Signed)
03/30/2016  9:19 AM  PATIENT:  Scott [name]  76 y.o. male  PRE-OPERATIVE DIAGNOSIS:  STERNAL PAIN  POST-OPERATIVE DIAGNOSIS:  STERNAL PAIN  PROCEDURE:  Procedure(s): STERNAL WIRES REMOVAL (N/A)  SURGEON:  Surgeon(s) and Role:    * Melrose Nakayama, MD - Primary  ANESTHESIA:   general  EBL:  Total I/O In: 1000 [I.V.:1000] Out: 25 [Blood:25]  BLOOD ADMINISTERED:none  DRAINS: none   LOCAL MEDICATIONS USED:  NONE  SPECIMEN:  Source of Specimen:  sternal wound tissue  DISPOSITION OF SPECIMEN:  Micro for bacterial and AFB cultures  COUNTS:  YES  PLAN OF CARE: Discharge to home after PACU  PATIENT DISPOSITION:  PACU - hemodynamically stable.   Delay start of Pharmacological VTE agent (>24hrs) due to surgical blood loss or risk of bleeding: not applicable

## 2016-03-30 NOTE — Anesthesia Postprocedure Evaluation (Addendum)
Anesthesia Post Note  Patient: Scott [name]  Procedure(s) Performed: Procedure(s) (LRB): STERNAL WIRES REMOVAL (N/A)  Patient location during evaluation: PACU Anesthesia Type: General Level of consciousness: awake and alert Pain management: pain level controlled Vital Signs Assessment: post-procedure vital signs reviewed and stable Respiratory status: spontaneous breathing, nonlabored ventilation, respiratory function stable and patient connected to nasal cannula oxygen Cardiovascular status: blood pressure returned to baseline and stable Postop Assessment: no signs of nausea or vomiting Anesthetic complications: no       Last Vitals:  Vitals:   03/30/16 1015 03/30/16 1030  BP: 127/63 135/88  Pulse: 61 67  Resp: 17 17  Temp:  36.7 C    Last Pain:  Vitals:   03/30/16 1000  TempSrc:   PainSc: 6                  Analycia Khokhar

## 2016-03-30 NOTE — Transfer of Care (Signed)
Immediate Anesthesia Transfer of Care Note  Patient: Scott [name]  Procedure(s) Performed: Procedure(s): STERNAL WIRES REMOVAL (N/A)  Patient Location: PACU  Anesthesia Type:General  Level of Consciousness: awake, alert  and oriented  Airway & Oxygen Therapy: Patient Spontanous Breathing and Patient connected to nasal cannula oxygen  Post-op Assessment: Report given to RN, Post -op Vital signs reviewed and stable and Patient moving all extremities X 4  Post vital signs: Reviewed and stable  Last Vitals:  Vitals:   03/30/16 0611  BP: 132/83  Pulse: 60  Temp: 36.4 C    Last Pain:  Vitals:   03/30/16 0611  TempSrc: Oral      Patients Stated Pain Goal: 2 (75/10/25 8527)  Complications: No apparent anesthesia complications

## 2016-03-30 NOTE — Anesthesia Preprocedure Evaluation (Signed)
Anesthesia Evaluation  Patient identified by MRN, date of birth, ID band Patient awake    Reviewed: Allergy & Precautions, H&P , NPO status , Patient's Chart, lab work & pertinent test results  History of Anesthesia Complications Negative for: history of anesthetic complications  Airway Mallampati: III  TM Distance: >3 FB Neck ROM: limited    Dental  (+) Poor Dentition, Chipped, Caps   Pulmonary neg shortness of breath, former smoker,    Pulmonary exam normal breath sounds clear to auscultation       Cardiovascular Exercise Tolerance: Good hypertension, (-) angina+ CAD, + Past MI, + Cardiac Stents, + CABG, + Peripheral Vascular Disease and +CHF  (-) DOE Normal cardiovascular exam Rhythm:regular Rate:Normal     Neuro/Psych PSYCHIATRIC DISORDERS Anxiety  Neuromuscular disease    GI/Hepatic negative GI ROS, Neg liver ROS, neg GERD  ,  Endo/Other  negative endocrine ROS  Renal/GU negative Renal ROS  negative genitourinary   Musculoskeletal  (+) Arthritis ,   Abdominal   Peds  Hematology negative hematology ROS (+)   Anesthesia Other Findings Past Medical History: No date: CAD (coronary artery disease)     Comment: a. 04/2012 Cath: 3VD->Med Rx;  b. 04/2013 PCI               RCA (2 DES); c. 03/2014 PCI: LAD 80 (3.0x23               Xience Alpine DES), 30 ISR in RCA; d. 06/2014               CABG x 2 ([facility]) LIMA->LAD, VG->OM; e. 11/2014               Neg MV;  f. 12/2014 Cath ([facility]): patent               grafts->Med Rx; g. 03/2015 MV ([facility]) low risk,               EF 40%; h. 05/2015 MV: low risk; i. 08/2015 Cath:              patent grafts, patent RCA/LAD stents, small               LCX/OM->Med Rx. No date: Cardiomyopathy, ischemic     Comment: a. 04/2012 Echo: EF 40-45%;  b. 08/2013 Echo: EF              45-50%, mild glob HK, mod lat/post HK, diast               dysfxn, mildly dil LA, mild MR, nl RVSP; c.   01/2015 Echo: EF 40-45%, Gr 1 DD, mildly dil LA. No date: Carotid arterial disease ([place])     Comment: a. 05/2013 Carotid U/S; bilat 40-50% ICA               stenosis. No date: Chronic Chest Pain No date: Chronic systolic CHF (congestive heart failure*     Comment: a. 01/2015 Echo: EF 40-45%. No date: Hyperlipidemia  Past Surgical History: 05/01/2012: CARDIAC CATHETERIZATION 04/2013: CARDIAC CATHETERIZATION     Comment: [facility];x3 stent 01/11/15 : CARDIAC CATHETERIZATION     Comment: [facility] 09/16/2015: CARDIAC CATHETERIZATION N/A     Comment: Procedure: LEFT HEART CATH AND CORS/GRAFTS               ANGIOGRAPHY;  Surgeon: Minna Merritts, MD;                Location: [place] CV LAB;  Service:  Cardiovascular;  Laterality: N/A; No date: CARPAL TUNNEL RELEASE     Comment: right hand 09/16/2014: CATARACT EXTRACTION W/PHACO Left     Comment: Procedure: CATARACT EXTRACTION PHACO AND               INTRAOCULAR LENS PLACEMENT (IOC);  Surgeon:               Leandrew Koyanagi, MD;  Location: [place];  Service: Ophthalmology;                Laterality: Left; No date: COLONOSCOPY 04/13/2014: CORONARY ANGIOPLASTY WITH STENT PLACEMENT 06-26-14: CORONARY ARTERY BYPASS GRAFT 08/22/2012: DE QUERVAIN'S RELEASE Left 04/26/2015: ESOPHAGOGASTRODUODENOSCOPY (EGD) WITH PROPOFOL N/A     Comment: Procedure: ESOPHAGOGASTRODUODENOSCOPY (EGD)               WITH PROPOFOL;  Surgeon: Hulen Luster, MD;                Location: [facility] ENDOSCOPY;  Service:               Gastroenterology;  Laterality: N/A; No date: right shoulder No date: TONSILLECTOMY  BMI    Body Mass Index:  24.54 kg/m      Reproductive/Obstetrics negative OB ROS                             Anesthesia Physical Anesthesia Plan  ASA: III  Anesthesia Plan: General   Post-op Pain Management:    Induction: Intravenous  Airway Management Planned: Oral ETT  Additional Equipment:  None  Intra-op Plan:   Post-operative Plan: Extubation in OR  Informed Consent: I have reviewed the patients History and Physical, chart, labs and discussed the procedure including the risks, benefits and alternatives for the proposed anesthesia with the patient or authorized representative who has indicated his/her understanding and acceptance.   Dental advisory given  Plan Discussed with: CRNA and Surgeon  Anesthesia Plan Comments:         Anesthesia Quick Evaluation

## 2016-03-30 NOTE — Op Note (Signed)
Scott [name], PIGNATO NO.:  [id number]  MEDICAL RECORD NO.:  [id number]  LOCATION:                                 FACILITY:  PHYSICIAN:  Revonda Standard. Roxan Hockey, M.D.DATE OF BIRTH:  02-20-1940  DATE OF PROCEDURE:  03/30/2016 DATE OF DISCHARGE:                              OPERATIVE REPORT   PREOPERATIVE DIAGNOSIS:  Sternal pain.  POSTOPERATIVE DIAGNOSIS:  Sternal pain.  PROCEDURE:  Sternal wire removal.  SURGEON:  [name], M.D.  ASSISTANT:  None.  ANESTHESIA:  General.  FINDINGS:  Sternum intact.  Wires removed intact.  Abundant granulation tissue around the upper sternal wire, sent for culture.  CLINICAL NOTE:  [name] is a 76 year old gentleman who had coronary artery bypass grafting 2 years ago at [facility].  He has had persistent sternal pain over the past year.  He was advised to undergo sternal wire removal to see if that would lessen his pain.  The indications, risks, benefits, and alternatives were discussed in detail with the patient. He understood and accepted the risks and agreed to proceed.  OPERATIVE NOTE:  Mr. Mcmanamon was brought to the operating room on March 30, 2016.  He had induction of general anesthesia and was intubated. Intravenous antibiotics were administered.  Sequential compression devices were placed on the calves for DVT prophylaxis.  The chest was prepped and draped in the usual sterile fashion.  After performing a time-out, an incision was made through the previous scar.  It was carried through the skin and subcutaneous tissue.  Dissection was carried down to the sternal wires, which were dissected out enough to allow these wires to be cut and removed.  All 7 wires were removed.  All were intact.  The sternum appeared well-healed.  There was abundant hypertrophic granulation tissue around the upper sternal wire.  A rongeur was used to debride this tissue and it was sent for cultures for both the bacteria and AFB.   The wound then was copiously irrigated with warm saline.  Hemostasis was achieved and the incision was closed in 3 layers.  Counts were correct at the end of the procedure.  The patient was extubated in the operating room and taken to the postanesthetic care unit in good condition.     Revonda Standard Roxan Hockey, M.D.     SCH/MEDQ  D:  03/30/2016  T:  03/30/2016  Job:  [id number]

## 2016-03-30 NOTE — Anesthesia Procedure Notes (Signed)
Procedure Name: Intubation Date/Time: 03/30/2016 8:29 AM Performed by: Gaylene Brooks Pre-anesthesia Checklist: Patient identified, Emergency Drugs available, Suction available and Patient being monitored Patient Re-evaluated:Patient Re-evaluated prior to inductionOxygen Delivery Method: Circle System Utilized Preoxygenation: Pre-oxygenation with 100% oxygen Intubation Type: IV induction Ventilation: Mask ventilation without difficulty Laryngoscope Size: Miller and 2 Grade View: Grade II Tube type: Oral Number of attempts: 1 Airway Equipment and Method: Stylet and Oral airway Placement Confirmation: ETT inserted through vocal cords under direct vision,  positive ETCO2 and breath sounds checked- equal and bilateral Secured at: 22 cm Tube secured with: Tape Dental Injury: Teeth and Oropharynx as per pre-operative assessment

## 2016-03-30 NOTE — Discharge Instructions (Signed)
Do not drive before 6/38/75. At that point you can drive if you are not taking narcotic pain medication  You may remove dressing and shower on Saturday 3/10  You have a prescription for tramadol, a narcotic pain medication. You may use as directed. You may use acetaminophen (Tylenol) or ibuprofen (Advil/ Motrin) instead of the tramadol.  Call [phone] if you have a fever > 101F, notice excessive redness, swelling or drainage from the incision, or have uncontrolled pain.  My office will contact you with follow up information

## 2016-03-31 ENCOUNTER — Encounter (HOSPITAL_COMMUNITY): Payer: Self-pay | Admitting: Thoracic Surgery (Cardiothoracic Vascular Surgery)

## 2016-03-31 LAB — ACID FAST SMEAR (AFB): ACID FAST SMEAR - AFSCU2: NEGATIVE

## 2016-04-04 LAB — AEROBIC/ANAEROBIC CULTURE W GRAM STAIN (SURGICAL/DEEP WOUND): Culture: NO GROWTH

## 2016-04-04 LAB — AEROBIC/ANAEROBIC CULTURE (SURGICAL/DEEP WOUND)

## 2016-04-14 ENCOUNTER — Other Ambulatory Visit: Payer: Self-pay | Admitting: Thoracic Surgery (Cardiothoracic Vascular Surgery)

## 2016-04-14 DIAGNOSIS — I25119 Atherosclerotic heart disease of native coronary artery with unspecified angina pectoris: Secondary | ICD-10-CM

## 2016-04-18 ENCOUNTER — Other Ambulatory Visit: Payer: Self-pay | Admitting: *Deleted

## 2016-04-18 ENCOUNTER — Other Ambulatory Visit: Payer: Self-pay | Admitting: Cardiovascular Disease

## 2016-04-18 ENCOUNTER — Ambulatory Visit
Admission: RE | Admit: 2016-04-18 | Discharge: 2016-04-18 | Disposition: A | Discharge status: Home / Self Care | Payer: Medicare HMO | Source: Ambulatory Visit | Attending: Thoracic Surgery (Cardiothoracic Vascular Surgery) | Admitting: Thoracic Surgery (Cardiothoracic Vascular Surgery)

## 2016-04-18 ENCOUNTER — Ambulatory Visit (INDEPENDENT_AMBULATORY_CARE_PROVIDER_SITE_OTHER): Payer: Self-pay | Admitting: Thoracic Surgery (Cardiothoracic Vascular Surgery)

## 2016-04-18 ENCOUNTER — Encounter: Payer: Self-pay | Admitting: Thoracic Surgery (Cardiothoracic Vascular Surgery)

## 2016-04-18 VITALS — BP 108/60 | HR 76 | Resp 16 | Ht 70.0 in | Wt 175.0 lb

## 2016-04-18 DIAGNOSIS — Z951 Presence of aortocoronary bypass graft: Secondary | ICD-10-CM

## 2016-04-18 DIAGNOSIS — R0602 Shortness of breath: Secondary | ICD-10-CM | POA: Diagnosis not present

## 2016-04-18 DIAGNOSIS — Z09 Encounter for follow-up examination after completed treatment for conditions other than malignant neoplasm: Secondary | ICD-10-CM

## 2016-04-18 DIAGNOSIS — R079 Chest pain, unspecified: Secondary | ICD-10-CM

## 2016-04-18 DIAGNOSIS — I25119 Atherosclerotic heart disease of native coronary artery with unspecified angina pectoris: Secondary | ICD-10-CM

## 2016-04-18 DIAGNOSIS — R0789 Other chest pain: Secondary | ICD-10-CM

## 2016-04-18 NOTE — Progress Notes (Signed)
[address]       [place], [address]             [phone]     HPI: Mr. Alewine returns for a scheduled postoperative follow-up visit  He is a 76 year old gentleman had coronary bypass grafting at [facility] in June 2016. His pain improved initially but about a year later he began having severe chest discomfort usually related to activities. He remains very active. He complains of the baseline severe aching pain in the upper part of the sternum and also a sharp stabbing pain along the right costosternal junction.  He was referred to Dr. Hardin Negus. A bone scan showed diffuse increased uptake in the manubrium and upper sternum. This was felt to possibly be chronic reaction or nonunion rather than a bone infection.   I saw him on 03/21/2016. Recommended as a first step doing a sternal wire removal before we progressed anything more aggressive. Sometimes it relieve pain immediately.  The sternal wire removal on 03/30/2016. The bone appeared well-healed. There was some abundant granulation tissue around the uppermost sternal wire in the manubrium. I biopsied this and sent for AFB cultures. AFB stains were negative the cultures are still pending.  He did well after the surgery but does continue to have the same pain syndromes as before.  Past Medical History:  Diagnosis Date  . Arthritis    right hip, since a fall  . CAD (coronary artery disease)    a. 04/2012 Cath: 3VD->Med Rx;  b. 04/2013 PCI RCA (2 DES); c. 03/2014 PCI: LAD 80 (3.0x23 Xience Alpine DES), 30 ISR in RCA; d. 06/2014 CABG x 2 ([facility]) LIMA->LAD, VG->OM; e. 11/2014 Neg MV;  f. 12/2014 Cath ([facility]): patent grafts->Med Rx; g. 03/2015 MV ([facility]) low risk, EF 40%; h. 05/2015 MV: low risk; i. 08/2015 Cath: patent grafts, patent RCA/LAD stents, small LCX/OM->Med Rx.  . Cardiomyopathy, ischemic    a. 04/2012 Echo: EF 40-45%;  b. 08/2013 Echo: EF 45-50%, mild glob HK, mod lat/post HK, diast dysfxn, mildly dil LA, mild MR, nl RVSP; c.  01/2015 Echo: EF 40-45%, Gr 1 DD, mildly dil LA.  . Carotid arterial disease ([place])    a. 05/2013 Carotid U/S; bilat 40-50% ICA stenosis.  . Chronic Chest Pain   . Chronic systolic CHF (congestive heart failure) ([place])    a. 01/2015 Echo: EF 40-45%.  Marland Kitchen Dyspnea    pt. unsure of exertion cause- that creates SOB at times   . Headache 1970's   migraine  . History of blood transfusion 2016   post op  . Hyperlipidemia   . Hypertension   . Myocardial infarction    unsure of when     Current Outpatient Prescriptions  Medication Sig Dispense Refill  . apixaban (ELIQUIS) 5 MG TABS tablet Take 5 mg by mouth 2 (two) times daily.    Marland Kitchen atorvastatin (LIPITOR) 80 MG tablet Take 80 mg by mouth at bedtime.    . carvedilol (COREG) 3.125 MG tablet Take 3.125 mg by mouth daily before breakfast.     . ibuprofen (ADVIL,MOTRIN) 200 MG tablet Take 400 mg by mouth every 6 (six) hours as needed for headache or mild pain.    . isosorbide mononitrate (IMDUR) 60 MG 24 hr tablet Take 1 tablet (60 mg total) by mouth daily. (Patient taking differently: Take 60 mg by mouth daily before breakfast. ) 90 tablet 3  . ramipril (ALTACE) 2.5 MG capsule Take 1 capsule (2.5  mg total) by mouth daily. 90 capsule 3  . traMADol (ULTRAM) 50 MG tablet Take 1-2 tablets (50-100 mg total) by mouth every 6 (six) hours as needed for moderate pain. 30 tablet 0  . trolamine salicylate (ASPERCREME) 10 % cream Apply 1 application topically daily as needed for muscle pain.    Marland Kitchen zolpidem (AMBIEN) 5 MG tablet Take 5 mg by mouth at bedtime.     . nitroGLYCERIN (NITROSTAT) 0.4 MG SL tablet Place 1 tablet (0.4 mg total) under the tongue every 5 (five) minutes as needed for chest pain. (Patient not taking: Reported on 04/18/2016) 25 tablet 6   No current facility-administered medications for this visit.     Physical Exam BP 108/60 (BP Location: Left Arm, Patient Position: Sitting, Cuff Size: Normal)   Pulse 76   Resp 16   Ht 5\' 10"  (1.778 m)   Wt  175 lb (79.4 kg)   SpO2 97% Comment: ON RA  BMI 25.42 kg/m  76 year old man in no acute distress Alert and oriented 3 with no focal deficits Incision well-healed Mild tenderness to palpation around manubrium, severe tenderness to palpation along the right sternal costal border along the third and fourth costal cartilages.  Impression: Mr. 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 I was hopeful that sternal wire removal would help at least the sharp stabbing component of the pain. He tolerated wire removal well, but still has the pain.  I want to wait until the AFB cultures come back negative before we do any more aggressive procedure. That will take about another 3 weeks. I do think we should get another CT in about the wires are out as the sternum will be more well-defined without scattering from the wires. There remains a question whether there is some form of a nonunion versus a low-grade chronic infection. There was no visible nonunion in the bone felt solid at the time of the wires were removed. I certainly don't want to put any new hardware in until we know that there is not a low-grade AFB infection.   Plan: CT chest to better evaluate the sternum.  Return in 3 weeks.  Melrose Nakayama, MD [facility] [phone]

## 2016-04-24 ENCOUNTER — Other Ambulatory Visit: Payer: Self-pay

## 2016-05-05 NOTE — Telephone Encounter (Signed)
Lmov for patient to call back  Needs to schedule appointment with Dr Rockey Situ for refills

## 2016-05-09 ENCOUNTER — Encounter: Payer: Self-pay | Admitting: Thoracic Surgery (Cardiothoracic Vascular Surgery)

## 2016-05-09 ENCOUNTER — Other Ambulatory Visit: Payer: Self-pay | Admitting: *Deleted

## 2016-05-09 ENCOUNTER — Ambulatory Visit (INDEPENDENT_AMBULATORY_CARE_PROVIDER_SITE_OTHER): Payer: Self-pay | Admitting: Thoracic Surgery (Cardiothoracic Vascular Surgery)

## 2016-05-09 ENCOUNTER — Ambulatory Visit
Admission: RE | Admit: 2016-05-09 | Discharge: 2016-05-09 | Disposition: A | Discharge status: Home / Self Care | Payer: Medicare HMO | Source: Ambulatory Visit | Attending: Thoracic Surgery (Cardiothoracic Vascular Surgery) | Admitting: Thoracic Surgery (Cardiothoracic Vascular Surgery)

## 2016-05-09 VITALS — BP 126/73 | HR 72 | Resp 16 | Ht 70.0 in | Wt 175.0 lb

## 2016-05-09 DIAGNOSIS — S2223XK Sternal manubrial dissociation, subsequent encounter for fracture with nonunion: Secondary | ICD-10-CM

## 2016-05-09 DIAGNOSIS — R079 Chest pain, unspecified: Secondary | ICD-10-CM

## 2016-05-09 DIAGNOSIS — Z951 Presence of aortocoronary bypass graft: Secondary | ICD-10-CM

## 2016-05-09 DIAGNOSIS — Z09 Encounter for follow-up examination after completed treatment for conditions other than malignant neoplasm: Secondary | ICD-10-CM

## 2016-05-09 DIAGNOSIS — R0789 Other chest pain: Secondary | ICD-10-CM

## 2016-05-09 MED ORDER — TRAMADOL HCL 50 MG PO TABS: 50.0000 mg | ORAL_TABLET | Freq: Four times a day (QID) | ORAL | 0 refills | Status: DC | PRN

## 2016-05-09 NOTE — Progress Notes (Signed)
[address]       [place] [address]             [phone]    HPI: Scott [name] returns for follow-up after sternal wire removal.  Scott [name] is a 76 year old man who had coronary bypass grafting at [facility] in June 2016. His postoperative pain was not unusual initially but about a year later he began having severe chest discomfort related activities. This gradually progressed to where he wouldn't have pain even without activity. He describes this as a sharp stabbing pain along the right costosternal junction and a severe aching pain in the upper portion of the sternum. A bone scan showed diffuse increased uptake in the manubrium and upper sternum it was felt to be a nonunion rather than infection. The sternal wire removal on 03/30/2016. That procedure was uncomplicated but he continued to have the pain. He really hasn't been taking anything for that lately but has used Ultram occasionally and is requesting more.  Past Medical History:  Diagnosis Date  . Arthritis    right hip, since a fall  . CAD (coronary artery disease)    a. 04/2012 Cath: 3VD->Med Rx;  b. 04/2013 PCI RCA (2 DES); c. 03/2014 PCI: LAD 80 (3.0x23 Xience Alpine DES), 30 ISR in RCA; d. 06/2014 CABG x 2 ([facility]) LIMA->LAD, VG->OM; e. 11/2014 Neg MV;  f. 12/2014 Cath ([facility]): patent grafts->Med Rx; g. 03/2015 MV ([facility]) low risk, EF 40%; h. 05/2015 MV: low risk; i. 08/2015 Cath: patent grafts, patent RCA/LAD stents, small LCX/OM->Med Rx.  . Cardiomyopathy, ischemic    a. 04/2012 Echo: EF 40-45%;  b. 08/2013 Echo: EF 45-50%, mild glob HK, mod lat/post HK, diast dysfxn, mildly dil LA, mild MR, nl RVSP; c. 01/2015 Echo: EF 40-45%, Gr 1 DD, mildly dil LA.  . Carotid arterial disease ([place])    a. 05/2013 Carotid U/S; bilat 40-50% ICA stenosis.  . Chronic Chest Pain   . Chronic systolic CHF (congestive heart failure) ([place])    a. 01/2015 Echo: EF 40-45%.  Marland Kitchen Dyspnea    pt. unsure of exertion cause- that creates SOB at times   .  Headache 1970's   migraine  . History of blood transfusion 2016   post op  . Hyperlipidemia   . Hypertension   . Myocardial infarction [facility])    unsure of when      Current Outpatient Prescriptions  Medication Sig Dispense Refill  . apixaban (ELIQUIS) 5 MG TABS tablet Take 5 mg by mouth 2 (two) times daily.    Marland Kitchen atorvastatin (LIPITOR) 80 MG tablet Take 80 mg by mouth at bedtime.    . carvedilol (COREG) 3.125 MG tablet Take 3.125 mg by mouth daily before breakfast.     . ibuprofen (ADVIL,MOTRIN) 200 MG tablet Take 400 mg by mouth every 6 (six) hours as needed for headache or mild pain.    . isosorbide mononitrate (IMDUR) 60 MG 24 hr tablet Take 60 mg by mouth daily. Before breakfast    . nitroGLYCERIN (NITROSTAT) 0.4 MG SL tablet Place 1 tablet (0.4 mg total) under the tongue every 5 (five) minutes as needed for chest pain. 25 tablet 6  . ramipril (ALTACE) 2.5 MG capsule Take 1 capsule (2.5 mg total) by mouth daily. 90 capsule 3  . trolamine salicylate (ASPERCREME) 10 % cream Apply 1 application topically daily as needed for muscle pain.    Marland Kitchen zolpidem (AMBIEN) 5 MG tablet Take 5 mg by  mouth at bedtime.     . traMADol (ULTRAM) 50 MG tablet Take 1-2 tablets (50-100 mg total) by mouth every 6 (six) hours as needed for moderate pain. 30 tablet 0   No current facility-administered medications for this visit.     Physical Exam BP 126/73 (BP Location: Left Arm, Patient Position: Sitting, Cuff Size: Large)   Pulse 72   Resp 16   Ht 5\' 10"  (1.778 m)   Wt 175 lb (79.4 kg)   SpO2 98% Comment: RA  BMI 25.76 kg/m  76 year old man in no acute distress but obvious discomfort Sternal incision well-healed. Markedly tender to palpation Cardiac regular rate and rhythm normal S1 and S2 Lungs clear with equal breath sounds bilaterally  Diagnostic Tests: CT CHEST WITHOUT CONTRAST  TECHNIQUE: Multidetector CT imaging of the chest was performed following the standard protocol without IV  contrast.  COMPARISON:  08/10/2015  FINDINGS: Cardiovascular: The heart size is normal. No pericardial effusion. Patient is status post CABG.  Mediastinum/Nodes: No mediastinal lymphadenopathy. No evidence for gross hilar lymphadenopathy although assessment is limited by the lack of intravenous contrast on today's study. The esophagus has normal imaging features. There is no axillary lymphadenopathy.  Lungs/Pleura: No focal airspace consolidation. 4 mm subpleural right lower lobe nodule (image 36 series 4) is stable, consistent with benign process. No focal airspace consolidation. No pulmonary edema or pleural effusion.  Upper Abdomen: Unremarkable.  Musculoskeletal: Bone windows reveal no worrisome lytic or sclerotic osseous lesions. Sternal wires have been removed in the interval since prior study. Nonunion of the manubrium evident. Sternum appears healed across the sternotomy site.  IMPRESSION: 1. 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 No evidence for parasternal or retrosternal fluid.   Electronically Signed   By: Misty Stanley M.D.   On: 05/09/2016 10:01 I personally reviewed the CT chest and concur with the findings noted above.  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 I did a sternal wire removal in hopes that the pain was related to the wires. That had no effect on his pain. Bone scan and CT are both consistent with nonunion with no definite evidence of infection. His intraoperative cultures time sternal wire removal were negative for aerobic and anaerobic bacteria. AFB stains were negative but the final cultures not been reported out yet. That report should come back any time is about 6 weeks out from the procedure.  I long discussion with him regarding our  options at this point. One would be conservative with pain management. That is pretty much filled at this point. The second option would be to resect the manubrium and move a pectoral flap and the area. That would be a pretty radical operation in the absence of infection. Third option would be a sternal debridement and plating. Bessie option I favor, albeit with no guarantee that it will eliminate his pain.  We had a long discussion regarding the possibility of plating the sternum. I think we'll have to open the entire sternum and placed the entire thing to get a reasonable result. He understands that this does carry some risk along with it and that there is no guarantee that it will eliminate his pain. I reviewed the indications, risks, benefits, and alternatives. He understands the risks include, but are not limited to death, MI, DVT,  PE, cardiac injury, infection, bleeding, possible need for transfusion, nonunion, continued pain, as well as possibility of other unforeseeable complications.  He understands that he does not need to make a decision at this time to think this over before deciding. However, he would like to proceed with sternal plating.  He will need to hold his Eliquis for 2 days prior to the surgery.  Plan: Stop Eliquis after dose on 05/21/2016 Sternal debridement and plating on 05/24/2016  Melrose Nakayama, MD [facility] [phone]

## 2016-05-12 LAB — ACID FAST CULTURE WITH REFLEXED SENSITIVITIES (MYCOBACTERIA)

## 2016-05-12 LAB — ACID FAST CULTURE WITH REFLEXED SENSITIVITIES: ACID FAST CULTURE - AFSCU3: NEGATIVE

## 2016-05-15 ENCOUNTER — Other Ambulatory Visit: Payer: Self-pay

## 2016-05-15 MED ORDER — CARVEDILOL 3.125 MG PO TABS: 3.1250 mg | ORAL_TABLET | Freq: Every day | ORAL | 3 refills | Status: DC

## 2016-05-19 NOTE — Telephone Encounter (Signed)
Lmov for patient to call back  Needs to schedule appointment with Dr Rockey Situ for refills

## 2016-05-22 ENCOUNTER — Ambulatory Visit (HOSPITAL_COMMUNITY)
Admission: RE | Admit: 2016-05-22 | Discharge: 2016-05-22 | Disposition: A | Discharge status: Home / Self Care | Payer: Medicare HMO | Source: Ambulatory Visit | Attending: Thoracic Surgery (Cardiothoracic Vascular Surgery) | Admitting: Thoracic Surgery (Cardiothoracic Vascular Surgery)

## 2016-05-22 ENCOUNTER — Encounter (HOSPITAL_COMMUNITY)
Admission: RE | Admit: 2016-05-22 | Discharge: 2016-05-22 | Disposition: A | Discharge status: Home / Self Care | Payer: Medicare HMO | Source: Ambulatory Visit | Attending: Thoracic Surgery (Cardiothoracic Vascular Surgery) | Admitting: Thoracic Surgery (Cardiothoracic Vascular Surgery)

## 2016-05-22 ENCOUNTER — Encounter (HOSPITAL_COMMUNITY): Payer: Self-pay

## 2016-05-22 ENCOUNTER — Other Ambulatory Visit: Payer: Self-pay

## 2016-05-22 DIAGNOSIS — S2223XK Sternal manubrial dissociation, subsequent encounter for fracture with nonunion: Secondary | ICD-10-CM

## 2016-05-22 DIAGNOSIS — I255 Ischemic cardiomyopathy: Secondary | ICD-10-CM | POA: Diagnosis not present

## 2016-05-22 DIAGNOSIS — Z0183 Encounter for blood typing: Secondary | ICD-10-CM

## 2016-05-22 DIAGNOSIS — Z01812 Encounter for preprocedural laboratory examination: Secondary | ICD-10-CM | POA: Insufficient documentation

## 2016-05-22 DIAGNOSIS — E785 Hyperlipidemia, unspecified: Secondary | ICD-10-CM | POA: Diagnosis not present

## 2016-05-22 DIAGNOSIS — M1611 Unilateral primary osteoarthritis, right hip: Secondary | ICD-10-CM | POA: Diagnosis not present

## 2016-05-22 DIAGNOSIS — I2510 Atherosclerotic heart disease of native coronary artery without angina pectoris: Secondary | ICD-10-CM | POA: Diagnosis not present

## 2016-05-22 DIAGNOSIS — I252 Old myocardial infarction: Secondary | ICD-10-CM | POA: Diagnosis not present

## 2016-05-22 DIAGNOSIS — X58XXXA Exposure to other specified factors, initial encounter: Secondary | ICD-10-CM

## 2016-05-22 DIAGNOSIS — I5022 Chronic systolic (congestive) heart failure: Secondary | ICD-10-CM | POA: Diagnosis not present

## 2016-05-22 DIAGNOSIS — R05 Cough: Secondary | ICD-10-CM | POA: Diagnosis not present

## 2016-05-22 DIAGNOSIS — I110 Hypertensive heart disease with heart failure: Secondary | ICD-10-CM | POA: Diagnosis not present

## 2016-05-22 DIAGNOSIS — Z01818 Encounter for other preprocedural examination: Secondary | ICD-10-CM

## 2016-05-22 DIAGNOSIS — Z951 Presence of aortocoronary bypass graft: Secondary | ICD-10-CM

## 2016-05-22 DIAGNOSIS — T8189XA Other complications of procedures, not elsewhere classified, initial encounter: Secondary | ICD-10-CM | POA: Diagnosis not present

## 2016-05-22 DIAGNOSIS — R0602 Shortness of breath: Secondary | ICD-10-CM | POA: Diagnosis not present

## 2016-05-22 DIAGNOSIS — Z955 Presence of coronary angioplasty implant and graft: Secondary | ICD-10-CM | POA: Diagnosis not present

## 2016-05-22 HISTORY — DX: Cough, unspecified: R05.9

## 2016-05-22 HISTORY — DX: Cough: R05

## 2016-05-22 LAB — APTT: aPTT: 33 seconds (ref 24–36)

## 2016-05-22 LAB — COMPREHENSIVE METABOLIC PANEL
ALT: 21 U/L (ref 17–63)
ANION GAP: 6 (ref 5–15)
AST: 25 U/L (ref 15–41)
Albumin: 3.7 g/dL (ref 3.5–5.0)
Alkaline Phosphatase: 43 U/L (ref 38–126)
BILIRUBIN TOTAL: 0.5 mg/dL (ref 0.3–1.2)
BUN: 16 mg/dL (ref 6–20)
CHLORIDE: 108 mmol/L (ref 101–111)
CO2: 24 mmol/L (ref 22–32)
Calcium: 8.9 mg/dL (ref 8.9–10.3)
Creatinine, Ser: 1.1 mg/dL (ref 0.61–1.24)
Glucose, Bld: 87 mg/dL (ref 65–99)
POTASSIUM: 4.2 mmol/L (ref 3.5–5.1)
Sodium: 138 mmol/L (ref 135–145)
TOTAL PROTEIN: 6.6 g/dL (ref 6.5–8.1)

## 2016-05-22 LAB — BLOOD GAS, ARTERIAL
ACID-BASE EXCESS: 1.3 mmol/L (ref 0.0–2.0)
BICARBONATE: 24.9 mmol/L (ref 20.0–28.0)
DRAWN BY: 421801
FIO2: 21
O2 Saturation: 96.1 %
PO2 ART: 84.2 mmHg (ref 83.0–108.0)
Patient temperature: 98.6
pCO2 arterial: 36.1 mmHg (ref 32.0–48.0)
pH, Arterial: 7.453 — ABNORMAL HIGH (ref 7.350–7.450)

## 2016-05-22 LAB — URINALYSIS, ROUTINE W REFLEX MICROSCOPIC
Bilirubin Urine: NEGATIVE
Glucose, UA: NEGATIVE mg/dL
Ketones, ur: NEGATIVE mg/dL
Leukocytes, UA: NEGATIVE
NITRITE: NEGATIVE
PROTEIN: NEGATIVE mg/dL
SQUAMOUS EPITHELIAL / LPF: NONE SEEN
Specific Gravity, Urine: 1.018 (ref 1.005–1.030)
pH: 5 (ref 5.0–8.0)

## 2016-05-22 LAB — CBC
HEMATOCRIT: 41.6 % (ref 39.0–52.0)
Hemoglobin: 13.7 g/dL (ref 13.0–17.0)
MCH: 28 pg (ref 26.0–34.0)
MCHC: 32.9 g/dL (ref 30.0–36.0)
MCV: 85.1 fL (ref 78.0–100.0)
PLATELETS: 210 10*3/uL (ref 150–400)
RBC: 4.89 MIL/uL (ref 4.22–5.81)
RDW: 14.8 % (ref 11.5–15.5)
WBC: 6.4 10*3/uL (ref 4.0–10.5)

## 2016-05-22 LAB — SURGICAL PCR SCREEN
MRSA, PCR: NEGATIVE
STAPHYLOCOCCUS AUREUS: NEGATIVE

## 2016-05-22 LAB — TYPE AND SCREEN
ABO/RH(D): A POS
ANTIBODY SCREEN: NEGATIVE

## 2016-05-22 LAB — PROTIME-INR
INR: 1.17
PROTHROMBIN TIME: 15 s (ref 11.4–15.2)

## 2016-05-22 NOTE — Pre-Procedure Instructions (Signed)
PHILLIP MAFFEI  05/22/2016      [facility] [place] [address] Phone: [phone] Fax: [place], [address] [place] [address] Phone: [phone] Fax: [phone]    Your procedure is scheduled on   Thursday  05/25/16  Report to [facility] Admitting at 600 A.M.  Call this number if you have problems the morning of surgery:  [phone]   Remember:  Do not eat food or drink liquids after midnight.  Take these medicines the morning of surgery with A SIP OF WATER   CARVEDILOL (COREG) ,ISOSORBIDE MONONITRATE (IMDUR), TRAMADOL IF NEEDED       (STOP ELIQUIS AS DIRECTED )   Do not wear jewelry, make-up or nail polish.  Do not wear lotions, powders, or perfumes, or deoderant.  Do not shave 48 hours prior to surgery.  Men may shave face and neck.  Do not bring valuables to the hospital.  [facility] is not responsible for any belongings or valuables.  Contacts, dentures or bridgework may not be worn into surgery.  Leave your suitcase in the car.  After surgery it may be brought to your room.  For patients admitted to the hospital, discharge time will be determined by your treatment team.  Patients discharged the day of surgery will not be allowed to drive home.   Name and phone number of your driver:    Special instructions:  [facility] - Preparing for Surgery  Before surgery, you can play an important role.  Because skin is not sterile, your skin needs to be as free of germs as possible.  You can reduce the number of germs on you skin by washing with CHG (chlorahexidine gluconate) soap before surgery.  CHG is an antiseptic cleaner which kills germs and bonds with the skin to continue killing germs even after washing.  Please DO NOT use if you have an allergy to CHG or antibacterial soaps.  If your skin becomes  reddened/irritated stop using the CHG and inform your nurse when you arrive at Short Stay.  Do not shave (including legs and underarms) for at least 48 hours prior to the first CHG shower.  You may shave your face.  Please follow these instructions carefully:   1.  Shower with CHG Soap the night before surgery and the                                morning of Surgery.  2.  If you choose to wash your hair, wash your hair first as usual with your       normal shampoo.  3.  After you shampoo, rinse your hair and body thoroughly to remove the                      Shampoo.  4.  Use CHG as you would any other liquid soap.  You can apply chg directly       to the skin and wash gently with scrungie or a clean washcloth.  5.  Apply the CHG Soap to your body ONLY FROM THE NECK DOWN.        Do not use on open wounds or open sores.  Avoid contact with your eyes,  ears, mouth and genitals (private parts).  Wash genitals (private parts)       with your normal soap.  6.  Wash thoroughly, paying special attention to the area where your surgery        will be performed.  7.  Thoroughly rinse your body with warm water from the neck down.  8.  DO NOT shower/wash with your normal soap after using and rinsing off       the CHG Soap.  9.  Pat yourself dry with a clean towel.            10.  Wear clean pajamas.            11.  Place clean sheets on your bed the night of your first shower and do not        sleep with pets.  Day of Surgery  Do not apply any lotions/deoderants the morning of surgery.  Please wear clean clothes to the hospital/surgery center.    Please read over the following fact sheets that you were given. MRSA Information and Surgical Site Infection Prevention

## 2016-05-24 NOTE — Anesthesia Preprocedure Evaluation (Addendum)
Anesthesia Evaluation  Patient identified by MRN, date of birth, ID band Patient awake    Reviewed: Allergy & Precautions, H&P , NPO status , Patient's Chart, lab work & pertinent test results  History of Anesthesia Complications Negative for: history of anesthetic complications  Airway Mallampati: III  TM Distance: >3 FB Neck ROM: limited    Dental  (+) Poor Dentition, Chipped, Caps, Dental Advisory Given   Pulmonary neg shortness of breath, former smoker,    Pulmonary exam normal        Cardiovascular Exercise Tolerance: Good hypertension, (-) angina+ CAD, + Past MI, + Cardiac Stents, + CABG, + Peripheral Vascular Disease and +CHF  (-) DOE Normal cardiovascular exam  Echo 2017 Left ventricle:  The cavity size was moderately dilated. Systolic function was mildly to moderately reduced. The estimated ejection fraction was in the range of 40% to 45%.    Neuro/Psych PSYCHIATRIC DISORDERS Anxiety  Neuromuscular disease    GI/Hepatic negative GI ROS, Neg liver ROS, neg GERD  ,  Endo/Other  negative endocrine ROS  Renal/GU negative Renal ROS  negative genitourinary   Musculoskeletal  (+) Arthritis ,   Abdominal   Peds  Hematology negative hematology ROS (+)   Anesthesia Other Findings Past Medical History: No date: CAD (coronary artery disease)     Comment: a. 04/2012 Cath: 3VD->Med Rx;  b. 04/2013 PCI               RCA (2 DES); c. 03/2014 PCI: LAD 80 (3.0x23               Xience Alpine DES), 30 ISR in RCA; d. 06/2014               CABG x 2 ([facility]) LIMA->LAD, VG->OM; e. 11/2014               Neg MV;  f. 12/2014 Cath ([facility]): patent               grafts->Med Rx; g. 03/2015 MV ([facility]) low risk,               EF 40%; h. 05/2015 MV: low risk; i. 08/2015 Cath:              patent grafts, patent RCA/LAD stents, small               LCX/OM->Med Rx. No date: Cardiomyopathy, ischemic     Comment: a. 04/2012 Echo: EF 40-45%;  b. 08/2013  Echo: EF              45-50%, mild glob HK, mod lat/post HK, diast               dysfxn, mildly dil LA, mild MR, nl RVSP; c.               01/2015 Echo: EF 40-45%, Gr 1 DD, mildly dil LA. No date: Carotid arterial disease ([place])     Comment: a. 05/2013 Carotid U/S; bilat 40-50% ICA               stenosis. No date: Chronic Chest Pain No date: Chronic systolic CHF (congestive heart failure*     Comment: a. 01/2015 Echo: EF 40-45%. No date: Hyperlipidemia  Past Surgical History: 05/01/2012: CARDIAC CATHETERIZATION 04/2013: CARDIAC CATHETERIZATION     Comment: [facility];x3 stent 01/11/15 : CARDIAC CATHETERIZATION     Comment: [facility] 09/16/2015: CARDIAC CATHETERIZATION N/A     Comment: Procedure: LEFT HEART CATH AND CORS/GRAFTS  ANGIOGRAPHY;  Surgeon: Minna Merritts, MD;                Location: [facility];  Service:               Cardiovascular;  Laterality: N/A; No date: CARPAL TUNNEL RELEASE     Comment: right hand 09/16/2014: CATARACT EXTRACTION W/PHACO Left     Comment: Procedure: CATARACT EXTRACTION PHACO AND               INTRAOCULAR LENS PLACEMENT (IOC);  Surgeon:               Leandrew Koyanagi, MD;  Location: [facility];  Service: Ophthalmology;                Laterality: Left; No date: COLONOSCOPY 04/13/2014: CORONARY ANGIOPLASTY WITH STENT PLACEMENT 06-26-14: CORONARY ARTERY BYPASS GRAFT 08/22/2012: DE QUERVAIN'S RELEASE Left 04/26/2015: ESOPHAGOGASTRODUODENOSCOPY (EGD) WITH PROPOFOL N/A     Comment: Procedure: ESOPHAGOGASTRODUODENOSCOPY (EGD)               WITH PROPOFOL;  Surgeon: Hulen Luster, MD;                Location: [facility];  Service:               Gastroenterology;  Laterality: N/A; No date: right shoulder No date: TONSILLECTOMY  BMI    Body Mass Index:  24.54 kg/m      Reproductive/Obstetrics negative OB ROS                           Anesthesia Physical  Anesthesia Plan  ASA:  III  Anesthesia Plan: General   Post-op Pain Management:    Induction: Intravenous  Airway Management Planned: Oral ETT  Additional Equipment: Arterial line  Intra-op Plan:   Post-operative Plan: Possible Post-op intubation/ventilation  Informed Consent: I have reviewed the patients History and Physical, chart, labs and discussed the procedure including the risks, benefits and alternatives for the proposed anesthesia with the patient or authorized representative who has indicated his/her understanding and acceptance.   Dental advisory given  Plan Discussed with: CRNA and Anesthesiologist  Anesthesia Plan Comments:       Anesthesia Quick Evaluation

## 2016-05-24 NOTE — Telephone Encounter (Signed)
Lmov for patient to call back  Needs to schedule appointment with Dr Rockey Situ for refills

## 2016-05-25 ENCOUNTER — Inpatient Hospital Stay (HOSPITAL_COMMUNITY)
Admission: RE | Admit: 2016-05-25 | Discharge: 2016-05-26 | DRG: 908 | Disposition: A | Discharge status: Home / Self Care | Payer: Medicare HMO | Source: Ambulatory Visit | Attending: Thoracic Surgery (Cardiothoracic Vascular Surgery) | Admitting: Thoracic Surgery (Cardiothoracic Vascular Surgery)

## 2016-05-25 ENCOUNTER — Encounter (HOSPITAL_COMMUNITY)
Admission: RE | Disposition: A | Discharge status: Home / Self Care | Payer: Self-pay | Source: Ambulatory Visit | Attending: Thoracic Surgery (Cardiothoracic Vascular Surgery)

## 2016-05-25 ENCOUNTER — Inpatient Hospital Stay (HOSPITAL_COMMUNITY): Payer: Medicare HMO | Admitting: Anesthesiology

## 2016-05-25 ENCOUNTER — Encounter (HOSPITAL_COMMUNITY): Payer: Self-pay | Admitting: Certified Registered Nurse Anesthetist

## 2016-05-25 ENCOUNTER — Inpatient Hospital Stay (HOSPITAL_COMMUNITY): Payer: Medicare HMO

## 2016-05-25 DIAGNOSIS — G43909 Migraine, unspecified, not intractable, without status migrainosus: Secondary | ICD-10-CM | POA: Diagnosis not present

## 2016-05-25 DIAGNOSIS — Z452 Encounter for adjustment and management of vascular access device: Secondary | ICD-10-CM | POA: Diagnosis not present

## 2016-05-25 DIAGNOSIS — Z955 Presence of coronary angioplasty implant and graft: Secondary | ICD-10-CM

## 2016-05-25 DIAGNOSIS — Z7901 Long term (current) use of anticoagulants: Secondary | ICD-10-CM | POA: Diagnosis not present

## 2016-05-25 DIAGNOSIS — S2223XK Sternal manubrial dissociation, subsequent encounter for fracture with nonunion: Secondary | ICD-10-CM

## 2016-05-25 DIAGNOSIS — E785 Hyperlipidemia, unspecified: Secondary | ICD-10-CM | POA: Diagnosis present

## 2016-05-25 DIAGNOSIS — Y832 Surgical operation with anastomosis, bypass or graft as the cause of abnormal reaction of the patient, or of later complication, without mention of misadventure at the time of the procedure: Secondary | ICD-10-CM | POA: Diagnosis not present

## 2016-05-25 DIAGNOSIS — I110 Hypertensive heart disease with heart failure: Secondary | ICD-10-CM | POA: Diagnosis not present

## 2016-05-25 DIAGNOSIS — I517 Cardiomegaly: Secondary | ICD-10-CM | POA: Diagnosis not present

## 2016-05-25 DIAGNOSIS — I252 Old myocardial infarction: Secondary | ICD-10-CM

## 2016-05-25 DIAGNOSIS — J984 Other disorders of lung: Secondary | ICD-10-CM | POA: Diagnosis present

## 2016-05-25 DIAGNOSIS — J939 Pneumothorax, unspecified: Secondary | ICD-10-CM

## 2016-05-25 DIAGNOSIS — M1611 Unilateral primary osteoarthritis, right hip: Secondary | ICD-10-CM | POA: Diagnosis not present

## 2016-05-25 DIAGNOSIS — I5022 Chronic systolic (congestive) heart failure: Secondary | ICD-10-CM | POA: Diagnosis not present

## 2016-05-25 DIAGNOSIS — T8189XA Other complications of procedures, not elsewhere classified, initial encounter: Secondary | ICD-10-CM | POA: Diagnosis not present

## 2016-05-25 DIAGNOSIS — I2510 Atherosclerotic heart disease of native coronary artery without angina pectoris: Secondary | ICD-10-CM | POA: Diagnosis present

## 2016-05-25 DIAGNOSIS — Z951 Presence of aortocoronary bypass graft: Secondary | ICD-10-CM

## 2016-05-25 DIAGNOSIS — Y713 Surgical instruments, materials and cardiovascular devices (including sutures) associated with adverse incidents: Secondary | ICD-10-CM | POA: Diagnosis present

## 2016-05-25 DIAGNOSIS — I5032 Chronic diastolic (congestive) heart failure: Secondary | ICD-10-CM | POA: Diagnosis not present

## 2016-05-25 DIAGNOSIS — T8132XA Disruption of internal operation (surgical) wound, not elsewhere classified, initial encounter: Secondary | ICD-10-CM | POA: Diagnosis not present

## 2016-05-25 DIAGNOSIS — I255 Ischemic cardiomyopathy: Secondary | ICD-10-CM | POA: Diagnosis not present

## 2016-05-25 DIAGNOSIS — Z4682 Encounter for fitting and adjustment of non-vascular catheter: Secondary | ICD-10-CM

## 2016-05-25 DIAGNOSIS — I25119 Atherosclerotic heart disease of native coronary artery with unspecified angina pectoris: Secondary | ICD-10-CM | POA: Diagnosis not present

## 2016-05-25 DIAGNOSIS — M9689 Other intraoperative and postprocedural complications and disorders of the musculoskeletal system: Secondary | ICD-10-CM | POA: Diagnosis present

## 2016-05-25 DIAGNOSIS — S2220XK Unspecified fracture of sternum, subsequent encounter for fracture with nonunion: Secondary | ICD-10-CM | POA: Diagnosis not present

## 2016-05-25 HISTORY — PX: RIB PLATING: SHX5079

## 2016-05-25 LAB — GLUCOSE, CAPILLARY
GLUCOSE-CAPILLARY: 107 mg/dL — AB (ref 65–99)
Glucose-Capillary: 132 mg/dL — ABNORMAL HIGH (ref 65–99)

## 2016-05-25 SURGERY — FIXATION, RIB, USING PLATE
Anesthesia: General

## 2016-05-25 MED ORDER — CARVEDILOL 3.125 MG PO TABS
3.1250 mg | ORAL_TABLET | Freq: Every day | ORAL | Status: DC
  Administered 2016-05-26: 3.125 mg via ORAL
  Filled 2016-05-25: qty 1

## 2016-05-25 MED ORDER — LIDOCAINE 2% (20 MG/ML) 5 ML SYRINGE
INTRAMUSCULAR | Status: AC
  Filled 2016-05-25: qty 5

## 2016-05-25 MED ORDER — ATORVASTATIN CALCIUM 80 MG PO TABS
80.0000 mg | ORAL_TABLET | Freq: Every day | ORAL | Status: DC
  Administered 2016-05-25: 80 mg via ORAL
  Filled 2016-05-25: qty 1

## 2016-05-25 MED ORDER — LACTATED RINGERS IV SOLN
INTRAVENOUS | Status: DC | PRN
  Administered 2016-05-25: 08:00:00 via INTRAVENOUS

## 2016-05-25 MED ORDER — DIPHENHYDRAMINE HCL 25 MG PO TABS
25.0000 mg | ORAL_TABLET | Freq: Four times a day (QID) | ORAL | Status: DC | PRN
  Filled 2016-05-25: qty 1

## 2016-05-25 MED ORDER — PROPOFOL 10 MG/ML IV BOLUS
INTRAVENOUS | Status: DC | PRN
  Administered 2016-05-25: 130 mg via INTRAVENOUS

## 2016-05-25 MED ORDER — HEMOSTATIC AGENTS (NO CHARGE) OPTIME
TOPICAL | Status: DC | PRN
  Administered 2016-05-25: 1 via TOPICAL

## 2016-05-25 MED ORDER — CEFUROXIME SODIUM 1.5 G IJ SOLR
1.5000 g | INTRAMUSCULAR | Status: AC
  Administered 2016-05-25: 1.5 g via INTRAVENOUS
  Filled 2016-05-25: qty 1.5

## 2016-05-25 MED ORDER — MIDAZOLAM HCL 2 MG/2ML IJ SOLN
INTRAMUSCULAR | Status: AC
  Filled 2016-05-25: qty 2

## 2016-05-25 MED ORDER — ALBUMIN HUMAN 5 % IV SOLN
INTRAVENOUS | Status: DC | PRN
  Administered 2016-05-25: 09:00:00 via INTRAVENOUS

## 2016-05-25 MED ORDER — FENTANYL CITRATE (PF) 250 MCG/5ML IJ SOLN
INTRAMUSCULAR | Status: AC
  Filled 2016-05-25: qty 5

## 2016-05-25 MED ORDER — SUGAMMADEX SODIUM 200 MG/2ML IV SOLN
INTRAVENOUS | Status: DC | PRN
  Administered 2016-05-25: 400 mg via INTRAVENOUS

## 2016-05-25 MED ORDER — PHENYLEPHRINE HCL 10 MG/ML IJ SOLN
INTRAMUSCULAR | Status: DC | PRN
  Administered 2016-05-25 (×2): 80 ug via INTRAVENOUS

## 2016-05-25 MED ORDER — 0.9 % SODIUM CHLORIDE (POUR BTL) OPTIME
TOPICAL | Status: DC | PRN
  Administered 2016-05-25: 2000 mL

## 2016-05-25 MED ORDER — DIPHENHYDRAMINE HCL 50 MG/ML IJ SOLN
INTRAMUSCULAR | Status: AC
  Administered 2016-05-25: 12.5 mg via INTRAVENOUS
  Filled 2016-05-25: qty 1

## 2016-05-25 MED ORDER — EPHEDRINE 5 MG/ML INJ
INTRAVENOUS | Status: AC
  Filled 2016-05-25: qty 10

## 2016-05-25 MED ORDER — ROCURONIUM BROMIDE 10 MG/ML (PF) SYRINGE
PREFILLED_SYRINGE | INTRAVENOUS | Status: AC
  Filled 2016-05-25: qty 10

## 2016-05-25 MED ORDER — ONDANSETRON HCL 4 MG/2ML IJ SOLN: 4.0000 mg | Freq: Four times a day (QID) | INTRAMUSCULAR | Status: DC | PRN

## 2016-05-25 MED ORDER — HYDROMORPHONE HCL 1 MG/ML IJ SOLN
0.2500 mg | INTRAMUSCULAR | Status: DC | PRN
  Administered 2016-05-25 (×4): 0.5 mg via INTRAVENOUS

## 2016-05-25 MED ORDER — ZOLPIDEM TARTRATE 5 MG PO TABS: 5.0000 mg | ORAL_TABLET | Freq: Every evening | ORAL | Status: DC | PRN

## 2016-05-25 MED ORDER — RAMIPRIL 2.5 MG PO CAPS
2.5000 mg | ORAL_CAPSULE | Freq: Every day | ORAL | Status: DC
  Filled 2016-05-25: qty 1

## 2016-05-25 MED ORDER — ACETAMINOPHEN 160 MG/5ML PO SOLN: 1000.0000 mg | Freq: Four times a day (QID) | ORAL | Status: DC

## 2016-05-25 MED ORDER — ROCURONIUM BROMIDE 100 MG/10ML IV SOLN
INTRAVENOUS | Status: DC | PRN
  Administered 2016-05-25: 30 mg via INTRAVENOUS
  Administered 2016-05-25: 70 mg via INTRAVENOUS
  Administered 2016-05-25: 10 mg via INTRAVENOUS

## 2016-05-25 MED ORDER — ACETAMINOPHEN 500 MG PO TABS
1000.0000 mg | ORAL_TABLET | Freq: Four times a day (QID) | ORAL | Status: DC
  Administered 2016-05-25 – 2016-05-26 (×4): 1000 mg via ORAL
  Filled 2016-05-25 (×4): qty 2

## 2016-05-25 MED ORDER — POTASSIUM CHLORIDE 10 MEQ/50ML IV SOLN: 10.0000 meq | Freq: Every day | INTRAVENOUS | Status: DC | PRN

## 2016-05-25 MED ORDER — SODIUM CHLORIDE 0.9 % IJ SOLN
OROMUCOSAL | Status: DC | PRN
  Administered 2016-05-25: 09:00:00 via TOPICAL

## 2016-05-25 MED ORDER — ONDANSETRON HCL 4 MG/2ML IJ SOLN
INTRAMUSCULAR | Status: DC | PRN
  Administered 2016-05-25: 4 mg via INTRAVENOUS

## 2016-05-25 MED ORDER — MIDAZOLAM HCL 5 MG/5ML IJ SOLN
INTRAMUSCULAR | Status: DC | PRN
  Administered 2016-05-25: 2 mg via INTRAVENOUS

## 2016-05-25 MED ORDER — ISOSORBIDE MONONITRATE ER 60 MG PO TB24
60.0000 mg | ORAL_TABLET | Freq: Every day | ORAL | Status: DC
  Administered 2016-05-26: 60 mg via ORAL
  Filled 2016-05-25: qty 1

## 2016-05-25 MED ORDER — SUGAMMADEX SODIUM 500 MG/5ML IV SOLN
INTRAVENOUS | Status: AC
  Filled 2016-05-25: qty 5

## 2016-05-25 MED ORDER — HYDROMORPHONE HCL 1 MG/ML IJ SOLN
INTRAMUSCULAR | Status: AC
  Administered 2016-05-25: 0.5 mg via INTRAVENOUS
  Filled 2016-05-25: qty 1

## 2016-05-25 MED ORDER — PROPOFOL 10 MG/ML IV BOLUS
INTRAVENOUS | Status: AC
  Filled 2016-05-25: qty 20

## 2016-05-25 MED ORDER — ONDANSETRON HCL 4 MG/2ML IJ SOLN
INTRAMUSCULAR | Status: AC
  Filled 2016-05-25: qty 2

## 2016-05-25 MED ORDER — LIDOCAINE HCL (CARDIAC) 20 MG/ML IV SOLN
INTRAVENOUS | Status: DC | PRN
  Administered 2016-05-25: 100 mg via INTRAVENOUS

## 2016-05-25 MED ORDER — TRAMADOL HCL 50 MG PO TABS
50.0000 mg | ORAL_TABLET | Freq: Four times a day (QID) | ORAL | Status: DC | PRN
  Administered 2016-05-25: 50 mg via ORAL

## 2016-05-25 MED ORDER — DIPHENHYDRAMINE HCL 50 MG/ML IJ SOLN
12.5000 mg | Freq: Once | INTRAMUSCULAR | Status: AC
  Administered 2016-05-25: 12.5 mg via INTRAVENOUS

## 2016-05-25 MED ORDER — INSULIN ASPART 100 UNIT/ML ~~LOC~~ SOLN
0.0000 [IU] | Freq: Four times a day (QID) | SUBCUTANEOUS | Status: DC
Start: 2016-05-25 — End: 2016-05-26
  Administered 2016-05-26: 2 [IU] via SUBCUTANEOUS

## 2016-05-25 MED ORDER — TRAMADOL HCL 50 MG PO TABS
ORAL_TABLET | ORAL | Status: AC
  Filled 2016-05-25: qty 1

## 2016-05-25 MED ORDER — FENTANYL CITRATE (PF) 100 MCG/2ML IJ SOLN: 25.0000 ug | INTRAMUSCULAR | Status: DC | PRN

## 2016-05-25 MED ORDER — FENTANYL CITRATE (PF) 100 MCG/2ML IJ SOLN
INTRAMUSCULAR | Status: DC | PRN
  Administered 2016-05-25 (×3): 50 ug via INTRAVENOUS
  Administered 2016-05-25: 150 ug via INTRAVENOUS
  Administered 2016-05-25: 50 ug via INTRAVENOUS

## 2016-05-25 MED ORDER — EPHEDRINE SULFATE 50 MG/ML IJ SOLN
INTRAMUSCULAR | Status: DC | PRN
  Administered 2016-05-25 (×4): 10 mg via INTRAVENOUS

## 2016-05-25 MED ORDER — PHENYLEPHRINE 40 MCG/ML (10ML) SYRINGE FOR IV PUSH (FOR BLOOD PRESSURE SUPPORT)
PREFILLED_SYRINGE | INTRAVENOUS | Status: AC
  Filled 2016-05-25: qty 10

## 2016-05-25 MED ORDER — PANTOPRAZOLE SODIUM 40 MG PO TBEC
40.0000 mg | DELAYED_RELEASE_TABLET | Freq: Every day | ORAL | Status: DC
  Administered 2016-05-26: 40 mg via ORAL
  Filled 2016-05-25: qty 1

## 2016-05-25 MED ORDER — KCL IN DEXTROSE-NACL 20-5-0.9 MEQ/L-%-% IV SOLN
INTRAVENOUS | Status: DC
  Administered 2016-05-25 – 2016-05-26 (×2): via INTRAVENOUS
  Filled 2016-05-25 (×2): qty 1000

## 2016-05-25 MED ORDER — PROMETHAZINE HCL 25 MG/ML IJ SOLN: 6.2500 mg | INTRAMUSCULAR | Status: DC | PRN

## 2016-05-25 MED ORDER — DEXTROSE 5 % IV SOLN
1.5000 g | Freq: Two times a day (BID) | INTRAVENOUS | Status: AC
  Administered 2016-05-25 – 2016-05-26 (×2): 1.5 g via INTRAVENOUS
  Filled 2016-05-25 (×2): qty 1.5

## 2016-05-25 SURGICAL SUPPLY — 73 items
BATTERY MAXDRIVER (MISCELLANEOUS) ×4 IMPLANT
BATTERY PACK STR FOR DRIVER (MISCELLANEOUS) IMPLANT
BENZOIN TINCTURE PRP APPL 2/3 (GAUZE/BANDAGES/DRESSINGS) IMPLANT
BLADE CORE FAN STRYKER (BLADE) ×2 IMPLANT
BLADE SAW GIGLI 510 (BLADE) IMPLANT
CANISTER SUCT 3000ML PPV (MISCELLANEOUS) ×2 IMPLANT
CATH HYDRAGLIDE XL THORACIC (CATHETERS) IMPLANT
CATH THORACIC 28FR (CATHETERS) ×2 IMPLANT
CATH THORACIC 28FR RT ANG (CATHETERS) IMPLANT
CATH THORACIC 36FR (CATHETERS) IMPLANT
CATH THORACIC 36FR RT ANG (CATHETERS) IMPLANT
CLIP TI MEDIUM 6 (CLIP) ×2 IMPLANT
CONT SPEC 4OZ CLIKSEAL STRL BL (MISCELLANEOUS) ×8 IMPLANT
COVER SURGICAL LIGHT HANDLE (MISCELLANEOUS) ×2 IMPLANT
DECANTER SPIKE VIAL GLASS SM (MISCELLANEOUS) IMPLANT
DERMABOND ADVANCED (GAUZE/BANDAGES/DRESSINGS)
DERMABOND ADVANCED .7 DNX12 (GAUZE/BANDAGES/DRESSINGS) IMPLANT
DRAPE LAPAROSCOPIC ABDOMINAL (DRAPES) ×2 IMPLANT
DRAPE WARM FLUID 44X44 (DRAPE) ×2 IMPLANT
DRSG AQUACEL AG ADV 3.5X14 (GAUZE/BANDAGES/DRESSINGS) ×2 IMPLANT
ELECT REM PT RETURN 9FT ADLT (ELECTROSURGICAL) ×2
ELECTRODE REM PT RTRN 9FT ADLT (ELECTROSURGICAL) ×1 IMPLANT
GAUZE SPONGE 4X4 12PLY STRL (GAUZE/BANDAGES/DRESSINGS) ×2 IMPLANT
GLOVE BIOGEL M 6.5 STRL (GLOVE) ×4 IMPLANT
GLOVE BIOGEL PI IND STRL 6 (GLOVE) ×1 IMPLANT
GLOVE BIOGEL PI IND STRL 6.5 (GLOVE) ×2 IMPLANT
GLOVE BIOGEL PI INDICATOR 6 (GLOVE) ×1
GLOVE BIOGEL PI INDICATOR 6.5 (GLOVE) ×2
GLOVE SURG SIGNA 7.5 PF LTX (GLOVE) ×4 IMPLANT
GOWN BRE IMP PREV XXLGXLNG (GOWN DISPOSABLE) ×2 IMPLANT
GOWN STRL REUS W/ TWL LRG LVL3 (GOWN DISPOSABLE) ×4 IMPLANT
GOWN STRL REUS W/TWL LRG LVL3 (GOWN DISPOSABLE) ×4
HEMOSTAT POWDER SURGIFOAM 1G (HEMOSTASIS) IMPLANT
KIT BASIN OR (CUSTOM PROCEDURE TRAY) ×2 IMPLANT
KIT ROOM TURNOVER OR (KITS) ×2 IMPLANT
KIT SUCTION CATH 14FR (SUCTIONS) ×2 IMPLANT
NS IRRIG 1000ML POUR BTL (IV SOLUTION) ×4 IMPLANT
PACK CHEST (CUSTOM PROCEDURE TRAY) ×2 IMPLANT
PAD ARMBOARD 7.5X6 YLW CONV (MISCELLANEOUS) ×4 IMPLANT
PLATE BONE LOCK TI 1.8 XSH 8H (Plate) ×6 IMPLANT
PLATE STERNAL 1.8MM THICK (Plate) ×2 IMPLANT
SCREW LOCKING TI 2.3X13MM (Screw) ×34 IMPLANT
SCREW STERNAL LOCK 2.3MM (Screw) ×44 IMPLANT
SEALANT SURG COSEAL 8ML (VASCULAR PRODUCTS) ×2 IMPLANT
SUT FIBERWIRE #2 38 T-5 BLUE (SUTURE)
SUT FIBERWIRE #5 38 CONV NDL (SUTURE)
SUT SILK  1 MH (SUTURE) ×1
SUT SILK 0 FSL (SUTURE) IMPLANT
SUT SILK 1 MH (SUTURE) ×1 IMPLANT
SUT SILK 2 0 SH (SUTURE) IMPLANT
SUT VIC AB 1 CTX 18 (SUTURE) ×2 IMPLANT
SUT VIC AB 1 CTX 27 (SUTURE) IMPLANT
SUT VIC AB 1 CTX 36 (SUTURE) ×3
SUT VIC AB 1 CTX36XBRD ANBCTR (SUTURE) ×3 IMPLANT
SUT VIC AB 2-0 CTX 27 (SUTURE) ×2 IMPLANT
SUT VIC AB 2-0 CTX 36 (SUTURE) ×2 IMPLANT
SUT VIC AB 3-0 SH 27 (SUTURE) ×6
SUT VIC AB 3-0 SH 27X BRD (SUTURE) ×6 IMPLANT
SUT VIC AB 3-0 X1 27 (SUTURE) ×4 IMPLANT
SUT VICRYL 2 TP 1 (SUTURE) IMPLANT
SUTURE FIBERWR #2 38 T-5 BLUE (SUTURE) IMPLANT
SUTURE FIBERWR #5 38 CONV NDL (SUTURE) IMPLANT
SWAB COLLECTION DEVICE MRSA (MISCELLANEOUS) ×2 IMPLANT
SWAB CULTURE ESWAB REG 1ML (MISCELLANEOUS) ×2 IMPLANT
SYSTEM SAHARA CHEST DRAIN RE-I (WOUND CARE) ×2 IMPLANT
TAPE CLOTH 4X10 WHT NS (GAUZE/BANDAGES/DRESSINGS) IMPLANT
TAPE CLOTH SURG 4X10 WHT LF (GAUZE/BANDAGES/DRESSINGS) ×2 IMPLANT
TOWEL OR 17X24 6PK STRL BLUE (TOWEL DISPOSABLE) IMPLANT
TOWEL OR 17X26 10 PK STRL BLUE (TOWEL DISPOSABLE) IMPLANT
TRAY FOLEY W/METER SILVER 16FR (SET/KITS/TRAYS/PACK) ×2 IMPLANT
TUNNELER SHEATH ON-Q 11GX8 DSP (PAIN MANAGEMENT) IMPLANT
TUNNELER SHEATH ON-Q 16GX12 DP (PAIN MANAGEMENT) IMPLANT
WATER STERILE IRR 1000ML POUR (IV SOLUTION) ×4 IMPLANT

## 2016-05-25 NOTE — H&P (View-Only) (Signed)
[address]       [place], [address]             [phone]    HPI: [name] returns for follow-up after sternal wire removal.  [name] is a 76 year old man who had coronary bypass grafting at [facility] in June 2016. His postoperative pain was not unusual initially but about a year later he began having severe chest discomfort related activities. This gradually progressed to where he wouldn't have pain even without activity. He describes this as a sharp stabbing pain along the right costosternal junction and a severe aching pain in the upper portion of the sternum. A bone scan showed diffuse increased uptake in the manubrium and upper sternum it was felt to be a nonunion rather than infection. The sternal wire removal on 03/30/2016. That procedure was uncomplicated but he continued to have the pain. He really hasn't been taking anything for that lately but has used Ultram occasionally and is requesting more.  Past Medical History:  Diagnosis Date  . Arthritis    right hip, since a fall  . CAD (coronary artery disease)    a. 04/2012 Cath: 3VD->Med Rx;  b. 04/2013 PCI RCA (2 DES); c. 03/2014 PCI: LAD 80 (3.0x23 Xience Alpine DES), 30 ISR in RCA; d. 06/2014 CABG x 2 ([facility]) LIMA->LAD, VG->OM; e. 11/2014 Neg MV;  f. 12/2014 Cath ([facility]): patent grafts->Med Rx; g. 03/2015 MV ([facility]) low risk, EF 40%; h. 05/2015 MV: low risk; i. 08/2015 Cath: patent grafts, patent RCA/LAD stents, small LCX/OM->Med Rx.  . Cardiomyopathy, ischemic    a. 04/2012 Echo: EF 40-45%;  b. 08/2013 Echo: EF 45-50%, mild glob HK, mod lat/post HK, diast dysfxn, mildly dil LA, mild MR, nl RVSP; c. 01/2015 Echo: EF 40-45%, Gr 1 DD, mildly dil LA.  . Carotid arterial disease ([place])    a. 05/2013 Carotid U/S; bilat 40-50% ICA stenosis.  . Chronic Chest Pain   . Chronic systolic CHF (congestive heart failure) ([place])    a. 01/2015 Echo: EF 40-45%.  Marland Kitchen Dyspnea    pt. unsure of exertion cause- that creates SOB at times   .  Headache 1970's   migraine  . History of blood transfusion 2016   post op  . Hyperlipidemia   . Hypertension   . Myocardial infarction [facility])    unsure of when      Current Outpatient Prescriptions  Medication Sig Dispense Refill  . apixaban (ELIQUIS) 5 MG TABS tablet Take 5 mg by mouth 2 (two) times daily.    Marland Kitchen atorvastatin (LIPITOR) 80 MG tablet Take 80 mg by mouth at bedtime.    . carvedilol (COREG) 3.125 MG tablet Take 3.125 mg by mouth daily before breakfast.     . ibuprofen (ADVIL,MOTRIN) 200 MG tablet Take 400 mg by mouth every 6 (six) hours as needed for headache or mild pain.    . isosorbide mononitrate (IMDUR) 60 MG 24 hr tablet Take 60 mg by mouth daily. Before breakfast    . nitroGLYCERIN (NITROSTAT) 0.4 MG SL tablet Place 1 tablet (0.4 mg total) under the tongue every 5 (five) minutes as needed for chest pain. 25 tablet 6  . ramipril (ALTACE) 2.5 MG capsule Take 1 capsule (2.5 mg total) by mouth daily. 90 capsule 3  . trolamine salicylate (ASPERCREME) 10 % cream Apply 1 application topically daily as needed for muscle pain.    Marland Kitchen zolpidem (AMBIEN) 5 MG tablet Take 5 mg by  mouth at bedtime.     . traMADol (ULTRAM) 50 MG tablet Take 1-2 tablets (50-100 mg total) by mouth every 6 (six) hours as needed for moderate pain. 30 tablet 0   No current facility-administered medications for this visit.     Physical Exam BP 126/73 (BP Location: Left Arm, Patient Position: Sitting, Cuff Size: Large)   Pulse 72   Resp 16   Ht 5\' 10"  (1.778 m)   Wt 175 lb (79.4 kg)   SpO2 98% Comment: RA  BMI 25.59 kg/m  76 year old man in no acute distress but obvious discomfort Sternal incision well-healed. Markedly tender to palpation Cardiac regular rate and rhythm normal S1 and S2 Lungs clear with equal breath sounds bilaterally  Diagnostic Tests: CT CHEST WITHOUT CONTRAST  TECHNIQUE: Multidetector CT imaging of the chest was performed following the standard protocol without IV  contrast.  COMPARISON:  08/10/2015  FINDINGS: Cardiovascular: The heart size is normal. No pericardial effusion. Patient is status post CABG.  Mediastinum/Nodes: No mediastinal lymphadenopathy. No evidence for gross hilar lymphadenopathy although assessment is limited by the lack of intravenous contrast on today's study. The esophagus has normal imaging features. There is no axillary lymphadenopathy.  Lungs/Pleura: No focal airspace consolidation. 4 mm subpleural right lower lobe nodule (image 36 series 4) is stable, consistent with benign process. No focal airspace consolidation. No pulmonary edema or pleural effusion.  Upper Abdomen: Unremarkable.  Musculoskeletal: Bone windows reveal no worrisome lytic or sclerotic osseous lesions. Sternal wires have been removed in the interval since prior study. Nonunion of the manubrium evident. Sternum appears healed across the sternotomy site.  IMPRESSION: 1. 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 No evidence for parasternal or retrosternal fluid.   Electronically Signed   By: Misty Stanley M.D.   On: 05/09/2016 10:01 I personally reviewed the CT chest and concur with the findings noted above.  Impression: [name] is a 76 year old man who is about 2 years out from coronary bypass grafting who has a nonunion of the manubrium with severe pain that is markedly impacting his activities and quality of life. I did a sternal wire removal in hopes that the pain was related to the wires. That had no effect on his pain. Bone scan and CT are both consistent with nonunion with no definite evidence of infection. His intraoperative cultures time sternal wire removal were negative for aerobic and anaerobic bacteria. AFB stains were negative but the final cultures not been reported out yet. That report should come back any time is about 6 weeks out from the procedure.  I long discussion with him regarding our  options at this point. One would be conservative with pain management. That is pretty much filled at this point. The second option would be to resect the manubrium and move a pectoral flap and the area. That would be a pretty radical operation in the absence of infection. Third option would be a sternal debridement and plating. Bessie option I favor, albeit with no guarantee that it will eliminate his pain.  We had a long discussion regarding the possibility of plating the sternum. I think we'll have to open the entire sternum and placed the entire thing to get a reasonable result. He understands that this does carry some risk along with it and that there is no guarantee that it will eliminate his pain. I reviewed the indications, risks, benefits, and alternatives. He understands the risks include, but are not limited to death, MI, DVT,  PE, cardiac injury, infection, bleeding, possible need for transfusion, nonunion, continued pain, as well as possibility of other unforeseeable complications.  He understands that he does not need to make a decision at this time to think this over before deciding. However, he would like to proceed with sternal plating.  He will need to hold his Eliquis for 2 days prior to the surgery.  Plan: Stop Eliquis after dose on 05/21/2016 Sternal debridement and plating on 05/24/2016  Melrose Nakayama, MD [facility] [phone]

## 2016-05-25 NOTE — Brief Op Note (Addendum)
05/25/2016  10:40 AM  PATIENT:  [name]  76 y.o. male  PRE-OPERATIVE DIAGNOSIS:  STERNAL NONUNION  POST-OPERATIVE DIAGNOSIS:  STERNAL NONUNION  PROCEDURE:  REDO MEDIAN STERNOTOMY for STERNAL DEBRIDEMENT and STERNAL PLATING   SURGEON:  Surgeon(s) and Role:    * Melrose Nakayama, MD - Primary  PHYSICIAN ASSISTANT: Lars Pinks PA-C  ANESTHESIA:   general  EBL:  Total I/O In: 2050 [I.V.:1800; IV Piggyback:250] Out: 87 [Urine:185; Blood:150]  BLOOD ADMINISTERED:none  DRAINS: 97 French chest tube placed in the mediastinal space   SPECIMEN:  Source of Specimen:  Sternal bone and sternal bone marrow  DISPOSITION OF SPECIMEN:  AFB, fungal, and bacterial cultures  COUNTS CORRECT:  YES   PLAN OF CARE: Admit to inpatient   PATIENT DISPOSITION:  PACU - hemodynamically stable.   Delay start of Pharmacological VTE agent (>24hrs) due to surgical blood loss or risk of bleeding: yes

## 2016-05-25 NOTE — Discharge Summary (Signed)
Physician Discharge Summary       [address]       [place] [address]             [phone]    Patient ID: Scott [name] MRN: [id number] DOB/AGE: [birth year] 74 y.o.  Admit date: 05/25/2016 Discharge date: 05/26/2016  Admission Diagnoses: 1. Chronic Chest Pain 2. Non union of sternum  Activ Diagnoses:  1. S/p CABG 2016  2. Cardiomyopathy, ischemic 3. Carotid arterial disease ([place]) 4. Chronic systolic CHF (congestive heart failure) ([place]) 5. Hyperlipidemia 6. Hypertension 7. Migraine headache 8. Myocardial infarction ([place]) 9. Arthritis-right hip   Procedure (s): Redo median sternotomy for sternal plating by Dr. Roxan Hockey on 05/25/2016.  History of Presenting Illness: This is a 76 year old man who had coronary bypass grafting at [facility] in June 2016. His postoperative pain was not unusual initially but about a year later he began having severe chest discomfort related activities. This gradually progressed to where he wouldn't have pain even without activity. He describes this as a sharp stabbing pain along the right costosternal junction and a severe aching pain in the upper portion of the sternum. A bone scan showed diffuse increased uptake in the manubrium and upper sternum it was felt to be a nonunion rather than infection. The sternal wire removal on 03/30/2016. That procedure was uncomplicated but he continued to have the pain. He really hasn't been taking anything for that lately but has used Ultram occasionally and is requesting more. Dr. Roxan Hockey did a sternal wire removal in hopes that the pain was related to the wires. That had no effect on his pain. Bone scan and CT are both consistent with nonunion with no definite evidence of infection. His intraoperative cultures time sternal wire removal were negative for aerobic and anaerobic bacteria. AFB stains were negative but the final cultures not been reported out yet. That report should come back any time is  about 6 weeks out from the procedure. Dr. Roxan Hockey and the patient had a long discussion regarding the possibility of plating the sternum. Dr. Roxan Hockey thought he would have to open the entire sternum and plate it in order to get a reasonable result. He understands that this does carry some risk along with it and that there is no guarantee that it will eliminate his pain.  Potential risks, benefits, and complications of the surgery were discussed with the patient and he agreed to proceed with surgery. He was instructed stop Eliquis after dose on 05/21/2016. He was admitted on 05/25/2016 in order to undergo sternla plating.  Brief Hospital Course:  He remained afebrile and hemodynamically stable. His chest tube did not have an air leak. CXR this am showed no pneumothorax. Chest tube and foley were removed on 05/04. Follow up chest xray again showed no pneumothorax. He was ambulating on room air. He was tolerating a diet. Home medications have all been resumed except Apixaban. Apixaban is to be restarted on 05/27/2016. Patient is felt surgically stable for discharge today.   Latest Vital Signs: Blood pressure 106/66, pulse 74, temperature 97.8 F (36.6 C), temperature source Oral, resp. rate (!) 21, height 5\' 10"  (1.778 m), weight 86.4 kg (190 lb 7.6 oz), SpO2 96 %.  Physical Exam: General appearance: alert, cooperative and no distress Neurologic: intact Heart: regular rate and rhythm Lungs: diminished breath sounds bibasilar Sternal wound: Aquacel intact. Minor sero sanguinous ooze from chest tube site.  Discharge Condition:Stable and discharged to home.  Recent laboratory studies:  Lab  Results  Component Value Date   WBC 4.9 05/26/2016   HGB 11.4 (L) 05/26/2016   HCT 34.1 (L) 05/26/2016   MCV 85.0 05/26/2016   PLT 134 (L) 05/26/2016   Lab Results  Component Value Date   NA 137 05/26/2016   K 4.2 05/26/2016   CL 104 05/26/2016   CO2 25 05/26/2016   CREATININE 1.00 05/26/2016    GLUCOSE 119 (H) 05/26/2016    Diagnostic Studies:  Ct Chest Wo Contrast  Result Date: 05/09/2016 CLINICAL DATA:  Chronic chest pain in the sternal region for several months. EXAM: CT CHEST WITHOUT CONTRAST TECHNIQUE: Multidetector CT imaging of the chest was performed following the standard protocol without IV contrast. COMPARISON:  08/10/2015 FINDINGS: Cardiovascular: The heart size is normal. No pericardial effusion. Patient is status post CABG. Mediastinum/Nodes: No mediastinal lymphadenopathy. No evidence for gross hilar lymphadenopathy although assessment is limited by the lack of intravenous contrast on today's study. The esophagus has normal imaging features. There is no axillary lymphadenopathy. Lungs/Pleura: No focal airspace consolidation. 4 mm subpleural right lower lobe nodule (image 36 series 4) is stable, consistent with benign process. No focal airspace consolidation. No pulmonary edema or pleural effusion. Upper Abdomen: Unremarkable. Musculoskeletal: Bone windows reveal no worrisome lytic or sclerotic osseous lesions. Sternal wires have been removed in the interval since prior study. Nonunion of the manubrium evident. Sternum appears healed across the sternotomy site. IMPRESSION: 1. 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 No evidence for parasternal or retrosternal fluid. Electronically Signed   By: Misty Stanley M.D.   On: 05/09/2016 10:01   Dg Chest Port 1 View  Result Date: 05/25/2016 CLINICAL DATA:  Postop sternal plates. Chest tube in place. Evaluate for pneumothorax. EXAM: PORTABLE CHEST 1 VIEW COMPARISON:  05/22/2016 FINDINGS: Status post placement of sternal plates. Status post CABG. The heart size is accentuated by the portable AP technique. There are no focal consolidations or pleural effusions. No pneumothorax. Mildly prominent interstitial markings appear stable. Minimal bibasilar atelectasis. IMPRESSION: No pneumothorax.  Postop changes.  Electronically Signed   By: Nolon Nations M.D.   On: 05/25/2016 12:03     Discharge Medications: Allergies as of 05/26/2016      Reactions   Contrast Media [iodinated Diagnostic Agents] Hives   Oxycodone Hcl Itching   Tramadol Itching   Vicodin [hydrocodone-acetaminophen] Itching      Medication List    TAKE these medications   apixaban 5 MG Tabs tablet Commonly known as:  ELIQUIS Take 1 tablet (5 mg total) by mouth 2 (two) times daily. Start taking on:  05/27/2016   atorvastatin 80 MG tablet Commonly known as:  LIPITOR Take 80 mg by mouth at bedtime.   carvedilol 3.125 MG tablet Commonly known as:  COREG Take 1 tablet (3.125 mg total) by mouth daily before breakfast.   diphenhydrAMINE 25 MG tablet Commonly known as:  BENADRYL Take 25 mg by mouth every 6 (six) hours as needed (for itching with tramadol).   ibuprofen 200 MG tablet Commonly known as:  ADVIL,MOTRIN Take 400 mg by mouth every 6 (six) hours as needed (for hip pain/headaches.).   ICY HOT EX Apply 1 application topically 3 (three) times daily as needed (for hip/shoulder pain.).   isosorbide mononitrate 60 MG 24 hr tablet Commonly known as:  IMDUR Take 60 mg by mouth daily before breakfast. Before breakfast   nitroGLYCERIN 0.4 MG SL tablet Commonly known as:  NITROSTAT Place 1 tablet (0.4 mg total) under the  tongue every 5 (five) minutes as needed for chest pain.   ramipril 2.5 MG capsule Commonly known as:  ALTACE Take 1 capsule (2.5 mg total) by mouth daily. What changed:  when to take this   traMADol 50 MG tablet Commonly known as:  ULTRAM Take 1-2 tablets (50-100 mg total) by mouth every 6 (six) hours as needed for moderate pain.   zolpidem 5 MG tablet Commonly known as:  AMBIEN Take 5 mg by mouth at bedtime as needed for sleep.       Follow Up Appointments: Follow-up Information    Melrose Nakayama, MD Follow up on 06/06/2016.   Specialty:  Cardiothoracic Surgery Why:  PA/LAT CXR to  be taken (at [place] which is in the same building as Dr. Leonarda Salon office) on 06/06/2016 at 3:00 pm;Appointment time is at 3:30 pm Contact information: [place] [address] [phone]           Signed: Lars Pinks MPA-C 05/26/2016, 12:45 PM

## 2016-05-25 NOTE — Transfer of Care (Signed)
Immediate Anesthesia Transfer of Care Note  Patient: Scott [name]  Procedure(s) Performed: Procedure(s): STERNAL PLATING (N/A)  Patient Location: PACU  Anesthesia Type:General  Level of Consciousness: awake, alert  and oriented  Airway & Oxygen Therapy: Patient Spontanous Breathing and Patient connected to nasal cannula oxygen  Post-op Assessment: Report given to RN and Post -op Vital signs reviewed and stable  Post vital signs: Reviewed and stable  Last Vitals:  Vitals:   05/25/16 1054 05/25/16 1056  BP: (!) 128/91   Pulse: 67   Resp: 10   Temp:  36.5 C    Last Pain:  Vitals:   05/25/16 1056  TempSrc:   PainSc: Asleep         Complications: No apparent anesthesia complications

## 2016-05-25 NOTE — Anesthesia Postprocedure Evaluation (Addendum)
Anesthesia Post Note  Patient: Scott [name]  Procedure(s) Performed: Procedure(s) (LRB): STERNAL PLATING (N/A)  Patient location during evaluation: PACU Anesthesia Type: General Level of consciousness: sedated Pain management: pain level controlled Vital Signs Assessment: post-procedure vital signs reviewed and stable Respiratory status: spontaneous breathing and respiratory function stable Cardiovascular status: stable Anesthetic complications: no       Last Vitals:  Vitals:   05/25/16 1154 05/25/16 1209  BP: 133/78 130/86  Pulse: 67 70  Resp: 10 10  Temp:      Last Pain:  Vitals:   05/25/16 1155  TempSrc:   PainSc: 7                  Karena Kinker DANIEL

## 2016-05-25 NOTE — Interval H&P Note (Signed)
History and Physical Interval Note:  05/25/2016 8:07 AM  Scott [name]  has presented today for surgery, with the diagnosis of STERNAL NONUNION  The various methods of treatment have been discussed with the patient and family. After consideration of risks, benefits and other options for treatment, the patient has consented to  Procedure(s): STERNAL PLATING (N/A) as a surgical intervention .  The patient's history has been reviewed, patient examined, no change in status, stable for surgery.  I have reviewed the patient's chart and labs.  Questions were answered to the patient's satisfaction.     Melrose Nakayama

## 2016-05-25 NOTE — Anesthesia Procedure Notes (Signed)
Procedure Name: Intubation Date/Time: 05/25/2016 8:31 AM Performed by: Ollen Bowl Pre-anesthesia Checklist: Patient identified, Emergency Drugs available, Suction available, Patient being monitored and Timeout performed Patient Re-evaluated:Patient Re-evaluated prior to inductionOxygen Delivery Method: Circle system utilized and Simple face mask Preoxygenation: Pre-oxygenation with 100% oxygen Intubation Type: IV induction Ventilation: Mask ventilation without difficulty Laryngoscope Size: Miller and 3 Grade View: Grade II Tube type: Oral Tube size: 7.5 mm Number of attempts: 1 Airway Equipment and Method: Patient positioned with wedge pillow and Stylet Placement Confirmation: ETT inserted through vocal cords under direct vision,  positive ETCO2 and breath sounds checked- equal and bilateral Secured at: 23 cm Tube secured with: Tape Dental Injury: Teeth and Oropharynx as per pre-operative assessment

## 2016-05-26 ENCOUNTER — Inpatient Hospital Stay (HOSPITAL_COMMUNITY): Payer: Medicare HMO

## 2016-05-26 ENCOUNTER — Encounter (HOSPITAL_COMMUNITY): Payer: Self-pay | Admitting: Thoracic Surgery (Cardiothoracic Vascular Surgery)

## 2016-05-26 LAB — BASIC METABOLIC PANEL
Anion gap: 8 (ref 5–15)
BUN: 10 mg/dL (ref 6–20)
CALCIUM: 7.9 mg/dL — AB (ref 8.9–10.3)
CO2: 25 mmol/L (ref 22–32)
Chloride: 104 mmol/L (ref 101–111)
Creatinine, Ser: 1 mg/dL (ref 0.61–1.24)
GFR calc Af Amer: >60 mL/min (ref >60)
GLUCOSE: 119 mg/dL — AB (ref 65–99)
POTASSIUM: 4.2 mmol/L (ref 3.5–5.1)
Sodium: 137 mmol/L (ref 135–145)

## 2016-05-26 LAB — GLUCOSE, CAPILLARY: GLUCOSE-CAPILLARY: 117 mg/dL — AB (ref 65–99)

## 2016-05-26 LAB — ACID FAST SMEAR (AFB, MYCOBACTERIA): Acid Fast Smear: NEGATIVE

## 2016-05-26 LAB — CBC
HCT: 34.1 % — ABNORMAL LOW (ref 39.0–52.0)
Hemoglobin: 11.4 g/dL — ABNORMAL LOW (ref 13.0–17.0)
MCH: 28.4 pg (ref 26.0–34.0)
MCHC: 33.4 g/dL (ref 30.0–36.0)
MCV: 85 fL (ref 78.0–100.0)
PLATELETS: 134 10*3/uL — AB (ref 150–400)
RBC: 4.01 MIL/uL — ABNORMAL LOW (ref 4.22–5.81)
RDW: 15 % (ref 11.5–15.5)
WBC: 4.9 10*3/uL (ref 4.0–10.5)

## 2016-05-26 LAB — ACID FAST SMEAR (AFB): ACID FAST SMEAR - AFSCU2: NEGATIVE

## 2016-05-26 MED ORDER — APIXABAN 5 MG PO TABS: 5.0000 mg | ORAL_TABLET | Freq: Two times a day (BID) | ORAL | Status: DC

## 2016-05-26 NOTE — Discharge Instructions (Signed)
Please remove sternal dressing (large midline dressing) in am on 05/27/2016. Please apply dry 4x4 with tape to chest tube wound;change daily and PRN. Once slight bloody ooze stops, you no longer need a dressing.  Activity: 1.May walk up steps                2.No lifting more than a "gallon of milk"                3.No driving until after seen in the office by Dr. Roxan Hockey                4.Stop any activity that causes chest pain, shortness of breath, dizziness, sweating or excessive weakness.                5.Avoid straining.                6.Continue with your breathing exercises daily.  Diet: Low fat, Low salt diet  Wound Care: May shower.  Clean wounds with mild soap and water daily. Contact the office at [phone] if any problems arise.

## 2016-05-26 NOTE — Op Note (Signed)
NAME:  Scott [name], Scott [name]                     ACCOUNT NO.:  MEDICAL RECORD NO.:  [id number]  LOCATION:                                 FACILITY:  PHYSICIAN:  Revonda Standard. Roxan Hockey, M.D.DATE OF BIRTH:  [birth year]  DATE OF PROCEDURE:  05/25/2016 DATE OF DISCHARGE:                              OPERATIVE REPORT   PREOPERATIVE DIAGNOSIS:  Sternal nonunion with chronic sternal pain.  POSTOPERATIVE DIAGNOSIS:  Sternal nonunion with chronic sternal pain.  PROCEDURE:  Sternal debridement and plating with KLA-Tencor system.  SURGEON:  Revonda Standard. Roxan Hockey, M.D.  ASSISTANT:  Lars Pinks, PA.  ANESTHESIA:  General.  FINDINGS:  Nonunion of the manubrium.  Lower portion of the sternum was healed, but in order to adequately debride the manubrium, it was re-opened. The left lung was densely adherent to underside of sternum.  Anterior lung repair with sutures and CoSeal applied.  Bone appeared viable on both sides.  CLINICAL NOTE:  Mr. [name] is a 76 year old gentleman who had coronary artery bypass grafting back in 2016.  He developed unrelenting severe pain in the upper portion of the sternum.  Bone scan showed diffused increased uptake in the manubrium and the upper sternum worrisome for nonunion versus infection.  The sternal wire removal was uncomplicated, but did not relieve his pain.  He was offered sternal debridement and plating with the understanding that the body of the sternum would likely have to be reopened to allow adequate debridement and reapproximation of the sternum.  The indications, risks, benefits and alternatives were discussed in detail with the patient.  He understood that there was no guarantee the procedure would relieve his pain.  OPERATIVE NOTE:  Mr. [name] was brought to the operating room on May 25, 2016.  He had induction of general anesthesia.  Intravenous antibiotics were administered.  A Foley catheter was placed. The chest, abdomen and groins were  prepped and draped in usual sterile fashion.  After performing a time-out, an incision made through the prior sternal incision. Dissection was carried down to the sternum superiorly. At the manubrium there was a nonunion and the synovium was incised.  The lower portion of the bone was well healed.  A redo sternotomy was performed with the oscillating saw.  The left lung was densely adherent to the underside of the sternum and tear in the lung did occur, each half of the hemi- sternum was elevated with bone hooks and underlying tissue was dissected off.  Then the lung tear was sutured with 3-0 Vicryl sutures and CoSeal was applied over top of this area.  The ventilation was held for a minute after applying the CoSeal to allow adhesion.  The area was copiously irrigated with warm saline.  Curettes and rongeurs were used to debride the sternal bone marrow and samples of it were sent for bacterial, fungal and AFB cultures.  Very minimal cortical bone debridement was performed to achieve fresh edges.  The sternum then was held together with clamps and towel clips, and plating was performed. A total of 4 plates and 38 screws were used to reapproximate the manubrium and the body of the sternum. The KLA-Tencor system was  used.  A minimum of 4 screws were placed on both sides of each plate. The longer plate required more screws. A total of 38 screws were utilized.  There was good approximation of the sternum along its entire length and the repair appeared solid.  The wound was again copiously irrigated with saline.  A chest tube had been placed into the mediastinum through separate incision prior to plating the sternum.  The pectoralis fascia, which had been mobilized during the sternal dissection, then was closed with a running #1 Vicryl suture.  The subcutaneous tissue and skin were closed in standard fashion.  All sponge, needle and instrument counts were correct at the end of the procedure.   The patient was taken from the operating room to the postanesthetic care unit, extubated and in good condition.     Revonda Standard Roxan Hockey, M.D.     SCH/MEDQ  D:  05/25/2016  T:  05/25/2016  Job:  [id number]

## 2016-05-26 NOTE — Progress Notes (Signed)
Patient has remained afebrile and hemodynamically stable. He wants to go home. We discussed at length wound care, shower in am, no driving until instructed otherwise, and no lifting more than a gallon of milk until instructed otherwise. He states he does NOT need a prescription for Tramadol as he has plenty at home.

## 2016-05-26 NOTE — Progress Notes (Signed)
Pt chest removed per MD order, pt tol well, no complaints, pt TCDB after removal, pt educated to call nursing for any changes,updates given to primary RN

## 2016-05-26 NOTE — Progress Notes (Signed)
1 Day Post-Op Procedure(s) (LRB): STERNAL PLATING (N/A) Subjective: c/o soreness, but not taking any pain med  Objective: Vital signs in last 24 hours: Temp:  [97.7 F (36.5 C)-98.1 F (36.7 C)] 98 F (36.7 C) (05/04 0754) Pulse Rate:  [64-84] 75 (05/04 0754) Cardiac Rhythm: (P) Normal sinus rhythm;Bundle branch block (05/04 0759) Resp:  [7-19] 15 (05/04 0754) BP: (105-142)/(61-95) 130/71 (05/04 0754) SpO2:  [84 %-100 %] 98 % (05/04 0754) Arterial Line BP: (38-178)/(0-70) 174/62 (05/03 1330) Weight:  [190 lb 7.6 oz (86.4 kg)] 190 lb 7.6 oz (86.4 kg) (05/03 1407)  Hemodynamic parameters for last 24 hours:    Intake/Output from previous day: 05/03 0701 - 05/04 0700 In: 3856.7 [P.O.:240; I.V.:3266.7; IV Piggyback:350] Out: 2035 [Urine:1510; Blood:150; Chest Tube:375] Intake/Output this shift: No intake/output data recorded.  General appearance: alert, cooperative and no distress Neurologic: intact Heart: regular rate and rhythm Lungs: diminished breath sounds bibasilar no air leak form CT  Lab Results:  Recent Labs  05/26/16 0503  WBC 4.9  HGB 11.4*  HCT 34.1*  PLT 134*   BMET:  Recent Labs  05/26/16 0503  NA 137  K 4.2  CL 104  CO2 25  GLUCOSE 119*  BUN 10  CREATININE 1.00  CALCIUM 7.9*    PT/INR: No results for input(s): LABPROT, INR in the last 72 hours. ABG    Component Value Date/Time   PHART 7.453 (H) 05/22/2016 1258   HCO3 24.9 05/22/2016 1258   O2SAT 96.1 05/22/2016 1258   CBG (last 3)   Recent Labs  05/25/16 1655 05/25/16 2309 05/26/16 0501  GLUCAP 107* 132* 117*    Assessment/Plan: S/P Procedure(s) (LRB): STERNAL PLATING (N/A) -  Doing well POD # 1 Dc chest tube Dc Foley Advance diet Mobilize Restart Eliquis tomorrow Possibly home tomorrow or even later today if no issues   LOS: 1 day    Melrose Nakayama 05/26/2016

## 2016-05-30 ENCOUNTER — Other Ambulatory Visit: Payer: Self-pay | Admitting: Thoracic Surgery (Cardiothoracic Vascular Surgery)

## 2016-05-30 DIAGNOSIS — M9689 Other intraoperative and postprocedural complications and disorders of the musculoskeletal system: Secondary | ICD-10-CM

## 2016-05-30 LAB — AEROBIC/ANAEROBIC CULTURE (SURGICAL/DEEP WOUND)
CULTURE: NO GROWTH
GRAM STAIN: NONE SEEN

## 2016-05-30 NOTE — Telephone Encounter (Signed)
Pt is schedule for 06/12/16 to see Dr Rockey Situ

## 2016-05-31 ENCOUNTER — Telehealth: Payer: Self-pay | Admitting: Thoracic Surgery (Cardiothoracic Vascular Surgery)

## 2016-05-31 NOTE — Telephone Encounter (Signed)
Scott [name] called this morning to report coughing up blood  He says that when he coughs he sees bright red blood. Less than a teaspoon, but was concerned. No shortness of breath, fever, or other symptoms. Has already taken Eliquis this morning  Had a laceration of the left lung with sternal reentry during rewiring.  Instructed him to hold evening dose of Eliquis  He is to call if he notes increased cough or volume of blood, shortness of breath, non-incisional chest pain, fever or any other concerns  Resume Eliquis tomorrow if bleeding stops  [name], MD [facility] [phone]

## 2016-06-01 ENCOUNTER — Ambulatory Visit (INDEPENDENT_AMBULATORY_CARE_PROVIDER_SITE_OTHER): Payer: Self-pay | Admitting: Thoracic Surgery (Cardiothoracic Vascular Surgery)

## 2016-06-01 ENCOUNTER — Encounter: Payer: Self-pay | Admitting: Thoracic Surgery (Cardiothoracic Vascular Surgery)

## 2016-06-01 ENCOUNTER — Ambulatory Visit
Admission: RE | Admit: 2016-06-01 | Discharge: 2016-06-01 | Disposition: A | Discharge status: Home / Self Care | Payer: Medicare HMO | Source: Ambulatory Visit | Attending: Thoracic Surgery (Cardiothoracic Vascular Surgery) | Admitting: Thoracic Surgery (Cardiothoracic Vascular Surgery)

## 2016-06-01 VITALS — BP 96/66 | HR 73 | Resp 16 | Ht 70.0 in | Wt 175.0 lb

## 2016-06-01 DIAGNOSIS — R042 Hemoptysis: Secondary | ICD-10-CM

## 2016-06-01 DIAGNOSIS — Z09 Encounter for follow-up examination after completed treatment for conditions other than malignant neoplasm: Secondary | ICD-10-CM

## 2016-06-01 DIAGNOSIS — R079 Chest pain, unspecified: Secondary | ICD-10-CM

## 2016-06-01 DIAGNOSIS — M9689 Other intraoperative and postprocedural complications and disorders of the musculoskeletal system: Secondary | ICD-10-CM

## 2016-06-01 NOTE — Progress Notes (Signed)
[address]       [place] [address]             [phone]    HPI: Scott [name] returns for a non scheduled visit.  She is a 76 year old gentleman with a history of coronary disease and ischemic cardiomyopathy. He a coronary bypass grafting 2 at [facility] in 2016. He also has a history of paroxysmal afibrillation and is on Eliquis for that. He had chronic sternal pain. I did a sternal rewiring on him back in February, but he continued to have persistent pain. Workup showed a nonunion of the manubrium.  On 05/25/2016 I did a redo sternotomy and sternal plating. During that reopening of the sternum there was a parenchymal laceration of the left upper lobe which was densely adherent to the bottom of the sternum. That was suture repaired and he did not have any issues with an air leak postoperatively.  Yesterday he noted small amounts of hemoptysis. Every time he would cough he would notice a small spot of blood within without small clots. These were always less than a dime in size. I instructed him to hold his request last night and then again this morning. He has continued to cough up small amounts of blood so he came to the office.  He did feel like he had a fever 1 day, but did not take his temperature. He hasn't had any chills or sweats. He has not had any mucous production with his cough. He has some soreness from his incision. He mostly is using Tylenol for pain but does take tramadol occasionally. Past Medical History:  Diagnosis Date  . Arthritis    right hip, since a fall  . CAD (coronary artery disease)    a. 04/2012 Cath: 3VD->Med Rx;  b. 04/2013 PCI RCA (2 DES); c. 03/2014 PCI: LAD 80 (3.0x23 Xience Alpine DES), 30 ISR in RCA; d. 06/2014 CABG x 2 ([facility]) LIMA->LAD, VG->OM; e. 11/2014 Neg MV;  f. 12/2014 Cath ([facility]): patent grafts->Med Rx; g. 03/2015 MV ([facility]) low risk, EF 40%; h. 05/2015 MV: low risk; i. 08/2015 Cath: patent grafts, patent RCA/LAD stents, small LCX/OM->Med  Rx.  . Cardiomyopathy, ischemic    a. 04/2012 Echo: EF 40-45%;  b. 08/2013 Echo: EF 45-50%, mild glob HK, mod lat/post HK, diast dysfxn, mildly dil LA, mild MR, nl RVSP; c. 01/2015 Echo: EF 40-45%, Gr 1 DD, mildly dil LA.  . Carotid arterial disease ([place])    a. 05/2013 Carotid U/S; bilat 40-50% ICA stenosis.  . Chronic Chest Pain   . Chronic systolic CHF (congestive heart failure) ([place])    a. 01/2015 Echo: EF 40-45%.  . Cough   . Dyspnea    pt. unsure of exertion cause- that creates SOB at times   . Headache 1970's   migraine  . History of blood transfusion 2016   post op  . Hyperlipidemia   . Hypertension   . Myocardial infarction [facility])    unsure of when    Current Outpatient Prescriptions  Medication Sig Dispense Refill  . atorvastatin (LIPITOR) 80 MG tablet Take 80 mg by mouth at bedtime.    . carvedilol (COREG) 3.125 MG tablet Take 1 tablet (3.125 mg total) by mouth daily before breakfast. 90 tablet 3  . diphenhydrAMINE (BENADRYL) 25 MG tablet Take 25 mg by mouth every 6 (six) hours as needed (for itching with tramadol).    Marland Kitchen ibuprofen (ADVIL,MOTRIN) 200 MG tablet Take 400 mg  by mouth every 6 (six) hours as needed (for hip pain/headaches.).     Marland Kitchen isosorbide mononitrate (IMDUR) 60 MG 24 hr tablet Take 60 mg by mouth daily before breakfast. Before breakfast     . Menthol, Topical Analgesic, (ICY HOT EX) Apply 1 application topically 3 (three) times daily as needed (for hip/shoulder pain.).    Marland Kitchen nitroGLYCERIN (NITROSTAT) 0.4 MG SL tablet Place 1 tablet (0.4 mg total) under the tongue every 5 (five) minutes as needed for chest pain. 25 tablet 6  . ramipril (ALTACE) 2.5 MG capsule Take 1 capsule (2.5 mg total) by mouth daily. (Patient taking differently: Take 2.5 mg by mouth daily with supper. ) 90 capsule 3  . traMADol (ULTRAM) 50 MG tablet Take 1-2 tablets (50-100 mg total) by mouth every 6 (six) hours as needed for moderate pain. 30 tablet 0  . zolpidem (AMBIEN) 5 MG tablet Take 5 mg by  mouth at bedtime as needed for sleep.     Marland Kitchen apixaban (ELIQUIS) 5 MG TABS tablet Take 1 tablet (5 mg total) by mouth 2 (two) times daily. (Patient not taking: Reported on 06/01/2016) 60 tablet    No current facility-administered medications for this visit.     Physical Exam BP 96/66 (BP Location: Right Arm, Patient Position: Sitting, Cuff Size: Large)   Pulse 73   Resp 16   Ht 5\' 10"  (1.778 m)   Wt 175 lb (79.4 kg)   SpO2 98% Comment: ON RA  BMI 25.11 kg/m  Well-appearing 76 year old man in no acute distress Alert and oriented 3 with no focal deficits Lungs clear with equal breath sounds bilaterally Cardiac regular rate and rhythm normal S1 and S2 Sternal incision clean dry and intact, sternum stable  Diagnostic Tests: CHEST  2 VIEW  COMPARISON:  None.  FINDINGS: The lungs are clear wiithout focal pneumonia, edema, pneumothorax or pleural effusion. The cardiopericardial silhouette is within normal limits for size. Patient is status post CABG. Sternal fixation hardware unchanged in the interval.  IMPRESSION: Stable.  No acute cardiopulmonary findings.   Electronically Signed   By: Misty Stanley M.D.   On: 06/01/2016 14:40 I personally reviewed the chest x-ray. There may be some haziness in the area of the lung where we did a repair of the laceration occurred with a redo sternotomy.  Impression: 76 year old gentleman who is now a week out from sternal debridement and plating for a manubrial nonunion and chronic pain. To repair the sternum we had to reopen the body of the sternum and during that there was a laceration of the left upper lobe. He is back on his Eliquis. I strongly suspect that he is bleeding from that area of the lung where there was a laceration from being on a blood thinner. There is nothing on history for exam to suggest pulmonary embolus. His heart rate is normal and his oxygen saturations 98% on room air.  I instructed him to hold his Eliquis through  tomorrow morning's dose. At that time if he is not coughing up any more blood, he can resume the blood thinner tomorrow night. He should hold the Eliquis until 24 hours from the last episode of hemoptysis. I also advised him to use an over-the-counter cough medication to help cut down on the coughing and irritation. If he notices an increase in coughing or the amount of hemoptysis he should contact me immediately.  He will follow-up with his regular scheduled appointment next week. We will repeat his x-ray at that  time.    Melrose Nakayama, MD [facility] [phone]

## 2016-06-02 DIAGNOSIS — H353131 Nonexudative age-related macular degeneration, bilateral, early dry stage: Secondary | ICD-10-CM | POA: Diagnosis not present

## 2016-06-06 ENCOUNTER — Ambulatory Visit
Admission: RE | Admit: 2016-06-06 | Discharge: 2016-06-06 | Disposition: A | Discharge status: Home / Self Care | Payer: Medicare HMO | Source: Ambulatory Visit | Attending: Thoracic Surgery (Cardiothoracic Vascular Surgery) | Admitting: Thoracic Surgery (Cardiothoracic Vascular Surgery)

## 2016-06-06 ENCOUNTER — Ambulatory Visit (INDEPENDENT_AMBULATORY_CARE_PROVIDER_SITE_OTHER): Payer: Self-pay | Admitting: Thoracic Surgery (Cardiothoracic Vascular Surgery)

## 2016-06-06 ENCOUNTER — Encounter: Payer: Self-pay | Admitting: Thoracic Surgery (Cardiothoracic Vascular Surgery)

## 2016-06-06 ENCOUNTER — Other Ambulatory Visit: Payer: Self-pay | Admitting: *Deleted

## 2016-06-06 VITALS — BP 114/72 | HR 72 | Resp 16 | Ht 70.0 in | Wt 175.0 lb

## 2016-06-06 DIAGNOSIS — M9689 Other intraoperative and postprocedural complications and disorders of the musculoskeletal system: Secondary | ICD-10-CM

## 2016-06-06 DIAGNOSIS — Z09 Encounter for follow-up examination after completed treatment for conditions other than malignant neoplasm: Secondary | ICD-10-CM

## 2016-06-06 DIAGNOSIS — R079 Chest pain, unspecified: Secondary | ICD-10-CM

## 2016-06-06 DIAGNOSIS — Z4789 Encounter for other orthopedic aftercare: Secondary | ICD-10-CM | POA: Diagnosis not present

## 2016-06-06 NOTE — Progress Notes (Signed)
[address]       [place] [address]             [phone]     HPI: Scott [name] returns for a scheduled follow-up visit.  He is a 76 year old man with a history of coronary disease who had a CABG 2 at [facility] in 2016. He has been having issues with chronic sternal pain for well over a year. He had an extensive cardiac workup which showed no issues. I did a sternal wire removal in February which did not improve his pain. 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 I did a sternal debridement and plating on 05/25/2016. As part of that I did a redo sternotomy. There was lung densely adherent to the underside of the sternum and there was a small laceration of the left upper lobe. He did well postoperatively and went home the next day.  I saw him back last week with hemoptysis. He held his Eliquis for couple of days and then restarted it. He has not had any further hemoptysis.  He complains of soreness in his chest. He took a tramadol and Sunday but did not use any yesterday. He is not having the same severe pain that he had prior surgery. He is anxious to resume his normal activities.  Past Medical History:  Diagnosis Date  . Arthritis    right hip, since a fall  . CAD (coronary artery disease)    a. 04/2012 Cath: 3VD->Med Rx;  b. 04/2013 PCI RCA (2 DES); c. 03/2014 PCI: LAD 80 (3.0x23 Xience Alpine DES), 30 ISR in RCA; d. 06/2014 CABG x 2 ([facility]) LIMA->LAD, VG->OM; e. 11/2014 Neg MV;  f. 12/2014 Cath ([facility]): patent grafts->Med Rx; g. 03/2015 MV ([facility]) low risk, EF 40%; h. 05/2015 MV: low risk; i. 08/2015 Cath: patent grafts, patent RCA/LAD stents, small LCX/OM->Med Rx.  . Cardiomyopathy, ischemic    a. 04/2012 Echo: EF 40-45%;  b. 08/2013 Echo: EF 45-50%, mild glob HK, mod lat/post HK, diast dysfxn, mildly dil LA, mild MR, nl RVSP; c. 01/2015 Echo: EF 40-45%, Gr 1 DD, mildly dil LA.  . Carotid arterial disease ([place])    a. 05/2013 Carotid U/S; bilat 40-50% ICA stenosis.    . Chronic Chest Pain   . Chronic systolic CHF (congestive heart failure) ([place])    a. 01/2015 Echo: EF 40-45%.  . Cough   . Dyspnea    pt. unsure of exertion cause- that creates SOB at times   . Headache 1970's   migraine  . History of blood transfusion 2016   post op  . Hyperlipidemia   . Hypertension   . Myocardial infarction [facility])    unsure of when    Current Outpatient Prescriptions  Medication Sig Dispense Refill  . apixaban (ELIQUIS) 5 MG TABS tablet Take 1 tablet (5 mg total) by mouth 2 (two) times daily. 60 tablet   . atorvastatin (LIPITOR) 80 MG tablet Take 80 mg by mouth at bedtime.    . carvedilol (COREG) 3.125 MG tablet Take 1 tablet (3.125 mg total) by mouth daily before breakfast. 90 tablet 3  . diphenhydrAMINE (BENADRYL) 25 MG tablet Take 25 mg by mouth every 6 (six) hours as needed (for itching with tramadol).    . isosorbide mononitrate (IMDUR) 60 MG 24 hr tablet Take 60 mg by mouth daily before breakfast. Before breakfast     . Menthol, Topical Analgesic, (ICY HOT EX) Apply 1  application topically 3 (three) times daily as needed (for hip/shoulder pain.).    Marland Kitchen nitroGLYCERIN (NITROSTAT) 0.4 MG SL tablet Place 1 tablet (0.4 mg total) under the tongue every 5 (five) minutes as needed for chest pain. 25 tablet 6  . ramipril (ALTACE) 2.5 MG capsule Take 1 capsule (2.5 mg total) by mouth daily. (Patient taking differently: Take 2.5 mg by mouth daily with supper. ) 90 capsule 3  . traMADol (ULTRAM) 50 MG tablet Take 1-2 tablets (50-100 mg total) by mouth every 6 (six) hours as needed for moderate pain. 30 tablet 0  . zolpidem (AMBIEN) 5 MG tablet Take 5 mg by mouth at bedtime as needed for sleep.      No current facility-administered medications for this visit.     Physical Exam BP 114/72 (BP Location: Right Arm, Patient Position: Sitting, Cuff Size: Large)   Pulse 72   Resp 16   Ht 5\' 10"  (1.778 m)   Wt 175 lb (79.4 kg)   SpO2 99% Comment: ON RA  BMI 25.19 kg/m   76 year old man in no acute distress Alert and oriented 3 with no focal deficits Lungs clear with equal breath sounds bilaterally Sternum stable, incision healing well  Diagnostic Tests: CHEST  2 VIEW  COMPARISON:  06/01/2016  FINDINGS: The cardiac silhouette, mediastinal and hilar contours are normal and stable. The lungs are clear. No pleural effusion. Stable plate and screw fixation of the sternum. No complicating features.  IMPRESSION: No acute cardiopulmonary findings.  Sternal hardware appears to be intact without complicating features.   Electronically Signed   By: Marijo Sanes M.D.   On: 06/06/2016 11:18 I personally reviewed the chest x-ray and concur with the findings noted above  Impression: Scott [name] is a 76 year old man who had a partial sternal nonunion and severe pain. He had sternal debridement and plating on 05/25/2016. He had some hemoptysis last week which resolved after we held his Eliquis for couple of days. I was likely related to the lung laceration with a redo sternotomy. His chest x-ray today shows clear lungs bilaterally  Pain- he does have some soreness from the surgery. He says this is very different and much more tolerable than the pain he had after his last surgery. He took a tramadol 2 days ago but has not had any since.  I recommended that he wait at least another week before driving. He is not to drive after taking tramadol. We will begin driving it should be on a limited basis and appropriate precautions were discussed. I recommended that he not lift anything over 10 pounds until 8 weeks from the time of surgery. That also goes for any other upper body exercise or exertion.  Plan: Return in 6 weeks with chest x-ray to check on his progress  Melrose Nakayama, MD [facility] [phone]

## 2016-06-10 NOTE — Progress Notes (Signed)
Cardiology Office Note  Date:  06/12/2016   ID:  Scott [name], DOB 1940/05/03, MRN [id number]  PCP:  Scott Brunner, MD   Chief Complaint  Patient presents with  . other    3 month follow up. "doing well."     HPI:  Scott [name] is a 76 year old gentleman with history of  CAD,  CABG 2 with LIMA to the LAD 06/2014, vein graft to the OM at [facility],  chronic chest pain not relieved with bypass surgery,  Redo median sternotomy for sternal plating  on 05/25/2016 EF 40 to 45% 01/2015 who presents for follow-up of his chronic angina pectoralis   Cardiac catheterization April 2014 Cardiac catheterization April 2015 with stent to the RCA Cardiac catheterization with stent placement March 2016, DES to the mid LAD Cath at [facility] 05/28/2014, 50% LAD lesion, distal RCA disease Notes indicating bypass surgery June 2016 at [place] test in November 2016 for chronic chest pain showing no ischemia cardiac catheterization December 2016 at [facility] Multiple  hospital admissions with chronic chest pain, neg w/u  Referred from pain clinic in [place] to CT surg for consult Bone scan:diffuse increased uptake in the manubrium and upper sternum sternal wire removal on 03/30/2016. Redo median sternotomy for sternal plating by Dr. Roxan Hockey on 05/25/2016. 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 EF 40 to 45%  02/08/2015  In follow-up today reports that he is doing well, walking 1 mile per day without significant chest pain He's not requiring tramadol  Previously referred to the pain clinic.  previously prescribed tramadol chest discomfort despite titrating doses of isosorbide and ranexa. Previously not interested in cardiac rehabilitation  EKG reviewed personally by myself on today's visit shows normal sinus rhythm, intraventricular conduction delay, Consider old inferior   MI   Other past medical history history of CAD dating back to April 2014 at which time he was found to have severe distal RCA disease with left to right collaterals, a subtotally occluded left circumflex, and moderate, diffuse LAD disease. He was initially medically managed.   In April 2015, he underwent repeat catheterization secondary to ongoing chest pain, showing stable moderate to severe disease. He then underwent successful RCA stenting.   In March 2016, due to recurrent chest pain, he underwent successful stenting of the LAD.    he had recurrent chest pain again in June 2016 and was seen at [facility].  He underwent CABG 2 with a LIMA to the LAD and a vein graft to the obtuse marginal.   following CABG, he continued to have persistent/chronic low-level left-sided and midsternal chest pain.  He had repeat hospitalizations  with stress testing in November at [facility], which was negative,  catheterization at [facility] in December 2016, which apparently showed patent grafts.  Lab work in the hospital  showing total cholesterol 142, LDL 77, HDL 47  Echocardiogram 03/02/2014 showing normal LV function, essentially normal study  Echocardiogram 04/22/2012 showing ejection fraction 40%, no mention of wall motion abnormality, mild MR, TR  PMH:   has a past medical history of Arthritis; CAD (coronary artery disease); Cardiomyopathy, ischemic; Carotid arterial disease ([place]); Chronic Chest Pain; Chronic systolic CHF (congestive heart failure) ([place]); Cough; Dyspnea; Headache (1970's); History of blood transfusion (2016); Hyperlipidemia; Hypertension; and Myocardial infarction ([place]).  PSH:    Past Surgical History:  Procedure Laterality Date  .  CARDIAC CATHETERIZATION  05/01/2012  . CARDIAC CATHETERIZATION  04/2013   [facility];x3 stent  . CARDIAC CATHETERIZATION  01/11/15    [facility]  . CARDIAC CATHETERIZATION N/A 09/16/2015   Procedure: LEFT HEART CATH AND CORS/GRAFTS ANGIOGRAPHY;  Surgeon: Minna Merritts,  MD;  Location: [facility];  Service: Cardiovascular;  Laterality: N/A;  . CARPAL TUNNEL RELEASE     right hand  . CATARACT EXTRACTION     LEFT  . CATARACT EXTRACTION W/PHACO Left 09/16/2014   Procedure: CATARACT EXTRACTION PHACO AND INTRAOCULAR LENS PLACEMENT (IOC);  Surgeon: Leandrew Koyanagi, MD;  Location: [place];  Service: Ophthalmology;  Laterality: Left;  . COLONOSCOPY    . COLONOSCOPY WITH PROPOFOL N/A 01/28/2016   Procedure: COLONOSCOPY WITH PROPOFOL;  Surgeon: Lollie Sails, MD;  Location: [facility] ENDOSCOPY;  Service: Endoscopy;  Laterality: N/A;  . CORONARY ANGIOPLASTY WITH STENT PLACEMENT  04/13/2014  . CORONARY ARTERY BYPASS GRAFT  06-26-14   x3 bypasses  . DE QUERVAIN'S RELEASE Left 08/22/2012  . ESOPHAGOGASTRODUODENOSCOPY (EGD) WITH PROPOFOL N/A 04/26/2015   Procedure: ESOPHAGOGASTRODUODENOSCOPY (EGD) WITH PROPOFOL;  Surgeon: Hulen Luster, MD;  Location: [facility] ENDOSCOPY;  Service: Gastroenterology;  Laterality: N/A;  . RIB PLATING N/A 05/25/2016   Procedure: STERNAL PLATING;  Surgeon: Melrose Nakayama, MD;  Location: [place];  Service: Thoracic;  Laterality: N/A;  . right shoulder    . STERNAL WIRES REMOVAL N/A 03/30/2016   Procedure: STERNAL WIRES REMOVAL;  Surgeon: Melrose Nakayama, MD;  Location: [place];  Service: Thoracic;  Laterality: N/A;  . TONSILLECTOMY      Current Outpatient Prescriptions  Medication Sig Dispense Refill  . apixaban (ELIQUIS) 5 MG TABS tablet Take 1 tablet (5 mg total) by mouth 2 (two) times daily. 60 tablet   . atorvastatin (LIPITOR) 80 MG tablet Take 80 mg by mouth at bedtime.    . carvedilol (COREG) 3.125 MG tablet Take 1 tablet (3.125 mg total) by mouth daily before breakfast. 90 tablet 3  . diphenhydrAMINE (BENADRYL) 25 MG tablet Take 25 mg by mouth every 6 (six) hours as needed (for itching with tramadol).    . isosorbide mononitrate (IMDUR) 60 MG 24 hr tablet Take 60 mg by mouth daily before breakfast. Before breakfast     .  nitroGLYCERIN (NITROSTAT) 0.4 MG SL tablet Place 1 tablet (0.4 mg total) under the tongue every 5 (five) minutes as needed for chest pain. 25 tablet 6  . ramipril (ALTACE) 2.5 MG capsule Take 1 capsule (2.5 mg total) by mouth daily. (Patient taking differently: Take 2.5 mg by mouth daily with supper. ) 90 capsule 3  . traMADol (ULTRAM) 50 MG tablet Take 1-2 tablets (50-100 mg total) by mouth every 6 (six) hours as needed for moderate pain. 30 tablet 0  . zolpidem (AMBIEN) 5 MG tablet Take 5 mg by mouth at bedtime as needed for sleep.      No current facility-administered medications for this visit.      Allergies:   Contrast media [iodinated diagnostic agents]; Oxycodone hcl; Tramadol; and Vicodin [hydrocodone-acetaminophen]   Social History:  The patient  reports that he has quit smoking. His smoking use included Cigarettes. He started smoking about 7 years ago. He has a 35.00 pack-year smoking history. He has never used smokeless tobacco. He reports that he does not drink alcohol or use drugs.   Family History:   family history includes Cancer in his maternal uncle; Heart attack (age of onset: 31) in his father.  Review of Systems: Review of Systems  Constitutional: Positive for malaise/fatigue.  Respiratory: Negative.   Cardiovascular: Positive for chest pain.  Gastrointestinal: Negative.   Musculoskeletal: Negative.   Neurological: Positive for dizziness.  Psychiatric/Behavioral: Negative.   All other systems reviewed and are negative.    PHYSICAL EXAM: VS:  BP 120/70 (BP Location: Left Arm, Patient Position: Sitting, Cuff Size: Normal)   Pulse 67   Ht 5\' 10"  (1.778 m)   Wt 172 lb 8 oz (78.2 kg)   BMI 24.75 kg/m  , BMI Body mass index is 24.75 kg/m.  GEN: Well nourished, well developed, in no acute distress  HEENT: normal  Neck: no JVD, carotid bruits, or masses Cardiac: RRR; no murmurs, rubs, or gallops,no edema  Well healed mediastinal incision Respiratory:  clear  to auscultation bilaterally, normal work of breathing GI: soft, nontender, nondistended, + BS MS: no deformity or atrophy  Skin: warm and dry, no rash Neuro:  Strength and sensation are intact Psych: euthymic mood, full affect    Recent Labs: 01/07/2016: B Natriuretic Peptide 28.0 05/22/2016: ALT 21 05/26/2016: BUN 10; Creatinine, Ser 1.00; Hemoglobin 11.4; Platelets 134; Potassium 4.2; Sodium 137    Lipid Panel Lab Results  Component Value Date   CHOL 122 12/15/2013   HDL 51 12/15/2013   LDLCALC 56 12/15/2013   TRIG 76 12/15/2013      Wt Readings from Last 3 Encounters:  06/12/16 172 lb 8 oz (78.2 kg)  06/06/16 175 lb (79.4 kg)  06/01/16 175 lb (79.4 kg)       ASSESSMENT AND PLAN:  PAF (paroxysmal atrial fibrillation) (HCC)CBC with Differential/Platelet, INR/PT Maintaining normal sinus rhythm, no changes made to his medications On anticoagulation  Chest pain, unspecified chest pain type -  Secondary to malunion of the sternum Surgery as detailed above Now with no chest pain   Coronary artery disease involving native coronary artery of native heart without angina pectoris We have stressed the importance of aggressive risk factor modification   nondiabetic, nonsmoker We will check cholesterol today  Hyperlipidemia No changes to the medications were made. Order placed for liver and lipid labs to be drawn in the office today   stable angina ([place]) Currently with stable symptoms   no further testing needed  Known stent to the LAD, RCA with bypass graft to the LAD and OM vessel   Total encounter time more than 25 minutes  Greater than 50% was spent in counseling and coordination of care with the patient   Disposition:   F/U  6 month   Orders Placed This Encounter  Procedures  . Hepatic function panel  . Lipid Profile  . EKG 12-Lead     Signed, Esmond Plants, M.D., Ph.D. 06/12/2016  [place], [place]

## 2016-06-12 ENCOUNTER — Ambulatory Visit (INDEPENDENT_AMBULATORY_CARE_PROVIDER_SITE_OTHER): Payer: Medicare HMO | Admitting: Cardiovascular Disease

## 2016-06-12 ENCOUNTER — Encounter: Payer: Self-pay | Admitting: Cardiovascular Disease

## 2016-06-12 VITALS — BP 120/70 | HR 67 | Ht 70.0 in | Wt 172.5 lb

## 2016-06-12 DIAGNOSIS — R079 Chest pain, unspecified: Secondary | ICD-10-CM | POA: Diagnosis not present

## 2016-06-12 DIAGNOSIS — Z951 Presence of aortocoronary bypass graft: Secondary | ICD-10-CM

## 2016-06-12 DIAGNOSIS — Z87891 Personal history of nicotine dependence: Secondary | ICD-10-CM | POA: Diagnosis not present

## 2016-06-12 DIAGNOSIS — M9689 Other intraoperative and postprocedural complications and disorders of the musculoskeletal system: Secondary | ICD-10-CM | POA: Diagnosis not present

## 2016-06-12 DIAGNOSIS — I25118 Atherosclerotic heart disease of native coronary artery with other forms of angina pectoris: Secondary | ICD-10-CM

## 2016-06-12 DIAGNOSIS — F419 Anxiety disorder, unspecified: Secondary | ICD-10-CM | POA: Diagnosis not present

## 2016-06-12 DIAGNOSIS — R69 Illness, unspecified: Secondary | ICD-10-CM | POA: Diagnosis not present

## 2016-06-12 NOTE — Patient Instructions (Signed)
Medication Instructions:   No medication changes made  Labwork:  Labs today: liver and lipids  Testing/Procedures:  No further testing at this time   I recommend watching educational videos on topics of interest to you at:       [email or web address].com  Enter code: [email or web address]    Follow-Up: It was a pleasure seeing you in the office today. Please call us if you have new issues that need to be addressed before your next appt.  [phone]  Your physician wants you to follow-up in: 6 months.  You will receive a reminder letter in the mail two months in advance. If you don't receive a letter, please call our office to schedule the follow-up appointment.  If you need a refill on your cardiac medications before your next appointment, please call your pharmacy.

## 2016-06-13 LAB — LIPID PANEL
CHOL/HDL RATIO: 2.7 ratio (ref 0.0–5.0)
Cholesterol, Total: 111 mg/dL (ref 100–199)
HDL: 41 mg/dL (ref >39)
LDL Calculated: 53 mg/dL (ref 0–99)
TRIGLYCERIDES: 84 mg/dL (ref 0–149)
VLDL Cholesterol Cal: 17 mg/dL (ref 5–40)

## 2016-06-13 LAB — HEPATIC FUNCTION PANEL
ALBUMIN: 4 g/dL (ref 3.5–4.8)
ALK PHOS: 71 IU/L (ref 39–117)
ALT: 13 IU/L (ref 0–44)
AST: 18 IU/L (ref 0–40)
BILIRUBIN TOTAL: 0.2 mg/dL (ref 0.0–1.2)
Bilirubin, Direct: 0.09 mg/dL (ref 0.00–0.40)
Total Protein: 6.6 g/dL (ref 6.0–8.5)

## 2016-06-22 LAB — FUNGUS CULTURE WITH STAIN

## 2016-06-22 LAB — FUNGUS CULTURE RESULT

## 2016-06-22 LAB — FUNGAL ORGANISM REFLEX

## 2016-06-24 NOTE — Addendum Note (Signed)
Addendum  created 06/24/16 0911 by Tyshawn Keel, MD   Sign clinical note    

## 2016-06-26 NOTE — Addendum Note (Signed)
Addendum  created 06/26/16 1219 by Oleta Mouse, MD   Sign clinical note

## 2016-06-27 DIAGNOSIS — I952 Hypotension due to drugs: Secondary | ICD-10-CM | POA: Diagnosis not present

## 2016-06-27 DIAGNOSIS — R05 Cough: Secondary | ICD-10-CM | POA: Diagnosis not present

## 2016-06-30 ENCOUNTER — Other Ambulatory Visit: Payer: Self-pay | Admitting: *Deleted

## 2016-06-30 DIAGNOSIS — G8918 Other acute postprocedural pain: Secondary | ICD-10-CM

## 2016-06-30 MED ORDER — TRAMADOL HCL 50 MG PO TABS: 50.0000 mg | ORAL_TABLET | Freq: Four times a day (QID) | ORAL | 0 refills | Status: DC | PRN

## 2016-07-07 LAB — ACID FAST CULTURE WITH REFLEXED SENSITIVITIES (MYCOBACTERIA)

## 2016-07-07 LAB — ACID FAST CULTURE WITH REFLEXED SENSITIVITIES: ACID FAST CULTURE - AFSCU3: NEGATIVE

## 2016-07-10 ENCOUNTER — Telehealth: Payer: Self-pay | Admitting: Cardiovascular Disease

## 2016-07-10 NOTE — Telephone Encounter (Signed)
Pt states he has been checking his HR, and every minute is is skipping a beat. States he is having chest pain, but that is from a surgery he had on his sternum. Please call.

## 2016-07-10 NOTE — Telephone Encounter (Signed)
Spoke w/ pt.  He states that his HR is skipping a beat and he is concerned. Advised him that these are most likely PVCs which can be aggravating, but are benign.  Asked him to call back if they become bothersome or his HR increases. He is appreciative of the call.

## 2016-07-14 LAB — ACID FAST CULTURE WITH REFLEXED SENSITIVITIES (MYCOBACTERIA): Acid Fast Culture: NEGATIVE

## 2016-07-14 LAB — ACID FAST CULTURE WITH REFLEXED SENSITIVITIES

## 2016-07-23 DIAGNOSIS — I2699 Other pulmonary embolism without acute cor pulmonale: Secondary | ICD-10-CM

## 2016-07-23 HISTORY — DX: Other pulmonary embolism without acute cor pulmonale: I26.99

## 2016-07-24 ENCOUNTER — Other Ambulatory Visit: Payer: Self-pay | Admitting: Thoracic Surgery (Cardiothoracic Vascular Surgery)

## 2016-07-24 DIAGNOSIS — M9689 Other intraoperative and postprocedural complications and disorders of the musculoskeletal system: Secondary | ICD-10-CM

## 2016-07-25 ENCOUNTER — Encounter: Payer: Self-pay | Admitting: Thoracic Surgery (Cardiothoracic Vascular Surgery)

## 2016-07-25 ENCOUNTER — Ambulatory Visit (INDEPENDENT_AMBULATORY_CARE_PROVIDER_SITE_OTHER): Payer: Self-pay | Admitting: Thoracic Surgery (Cardiothoracic Vascular Surgery)

## 2016-07-25 ENCOUNTER — Ambulatory Visit
Admission: RE | Admit: 2016-07-25 | Discharge: 2016-07-25 | Disposition: A | Discharge status: Home / Self Care | Payer: Medicare HMO | Source: Ambulatory Visit | Attending: Thoracic Surgery (Cardiothoracic Vascular Surgery) | Admitting: Thoracic Surgery (Cardiothoracic Vascular Surgery)

## 2016-07-25 VITALS — BP 146/80 | HR 67 | Resp 16 | Ht 70.0 in | Wt 175.4 lb

## 2016-07-25 DIAGNOSIS — M9689 Other intraoperative and postprocedural complications and disorders of the musculoskeletal system: Secondary | ICD-10-CM

## 2016-07-25 DIAGNOSIS — R079 Chest pain, unspecified: Secondary | ICD-10-CM | POA: Diagnosis not present

## 2016-07-25 DIAGNOSIS — Z09 Encounter for follow-up examination after completed treatment for conditions other than malignant neoplasm: Secondary | ICD-10-CM

## 2016-07-25 NOTE — Progress Notes (Signed)
[address]       [place] [address]             [phone]    HPI: Scott [name] returns for follow-up after sternal plating.  He is 76 year old gentleman who had coronary bypass grafting 2 at [facility] in 2016. He had issues with severe chronic sternal pain for well over a year. Cardiac workup showed no issues. Sternal wire removal did not help his pain. 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 I did a sternal debridement and plating on 05/25/2016. I last saw him in the office on 06/06/2016. He was still having pain at that time.  He continues to have pain. He says he woke up this morning with a severe tightness and pain in his chest. It resolved quickly. He rarely takes tramadol for the pain.  Past Medical History:  Diagnosis Date  . Arthritis    right hip, since a fall  . CAD (coronary artery disease)    a. 04/2012 Cath: 3VD->Med Rx;  b. 04/2013 PCI RCA (2 DES); c. 03/2014 PCI: LAD 80 (3.0x23 Xience [place] DES), 30 ISR in RCA; d. 06/2014 CABG x 2 ([facility]) LIMA->LAD, VG->OM; e. 11/2014 Neg MV;  f. 12/2014 Cath ([facility]): patent grafts->Med Rx; g. 03/2015 MV ([facility]) low risk, EF 40%; h. 05/2015 MV: low risk; i. 08/2015 Cath: patent grafts, patent RCA/LAD stents, small LCX/OM->Med Rx.  . Cardiomyopathy, ischemic    a. 04/2012 Echo: EF 40-45%;  b. 08/2013 Echo: EF 45-50%, mild glob HK, mod lat/post HK, diast dysfxn, mildly dil LA, mild MR, nl RVSP; c. 01/2015 Echo: EF 40-45%, Gr 1 DD, mildly dil LA.  . Carotid arterial disease ([place])    a. 05/2013 Carotid U/S; bilat 40-50% ICA stenosis.  . Chronic Chest Pain   . Chronic systolic CHF (congestive heart failure) ([place])    a. 01/2015 Echo: EF 40-45%.  . Cough   . Dyspnea    pt. unsure of exertion cause- that creates SOB at times   . Headache 1970's   migraine  . History of blood transfusion 2016   post op  . Hyperlipidemia   . Hypertension   . Myocardial infarction [facility])    unsure of when     Current Outpatient  Prescriptions  Medication Sig Dispense Refill  . apixaban (ELIQUIS) 5 MG TABS tablet Take 1 tablet (5 mg total) by mouth 2 (two) times daily. 60 tablet   . atorvastatin (LIPITOR) 80 MG tablet Take 80 mg by mouth at bedtime.    . carvedilol (COREG) 3.125 MG tablet Take 1 tablet (3.125 mg total) by mouth daily before breakfast. 90 tablet 3  . diphenhydrAMINE (BENADRYL) 25 MG tablet Take 25 mg by mouth every 6 (six) hours as needed (for itching with tramadol).    . isosorbide mononitrate (IMDUR) 60 MG 24 hr tablet Take 60 mg by mouth daily before breakfast. Before breakfast     . nitroGLYCERIN (NITROSTAT) 0.4 MG SL tablet Place 1 tablet (0.4 mg total) under the tongue every 5 (five) minutes as needed for chest pain. 25 tablet 6  . traMADol (ULTRAM) 50 MG tablet Take 1-2 tablets (50-100 mg total) by mouth every 6 (six) hours as needed for moderate pain. 30 tablet 0  . zolpidem (AMBIEN) 5 MG tablet Take 5 mg by mouth at bedtime as needed for sleep.     . ramipril (ALTACE) 2.5 MG capsule Take 1 capsule (2.5 mg total)  by mouth daily. (Patient not taking: Reported on 07/25/2016) 90 capsule 3   No current facility-administered medications for this visit.     Physical Exam BP (!) 146/80 (BP Location: Right Arm, Patient Position: Sitting, Cuff Size: Large)   Pulse 67   Resp 16   Ht 5\' 10"  (1.778 m)   Wt 175 lb 6.4 oz (79.6 kg)   SpO2 99% Comment: ON RA  BMI 25.60 kg/m  76 year old man in no acute distress Alert and oriented 3 with no focal deficits Sternal incision well-healed healed with the exception of one area of possible suture granuloma in the lower third Sternum stable  Diagnostic Tests: CHEST  2 VIEW  COMPARISON:  06/06/2016  FINDINGS: Previous median sternotomy. Extensive sternal plate procedure. Heart size is normal. Coronary artery stents noted. Pulmonary vascularity is normal. Lungs are clear. No effusions. No change in the sternal hardware.  IMPRESSION: No active  cardiopulmonary disease.   Electronically Signed   By: Nelson Chimes M.D.   On: 07/25/2016 10:35    I personally reviewed the chest x-ray and concur with the findings noted above.  Impression: Scott [name] is a 76 year old gentleman who had sternal debridement and plating for chronic sternal pain. He continues to have pain, although is hard to tell how much of this is his ongoing chronic pain versus pain from the recent procedure. He does have a lot of hardware in the chest.  He is very sensitized pain and had a great deal more pain and reaction with manipulation around the suture granuloma then is typical.  His bone appears to be healed at this point. There is no movement. He does have tenderness to palpation along the costal margins. I don't see any benefit to restricting his activities at this point. He may resume his normal activities, but was cautioned to build into new activities gradually as they may increase his pain.  Plan: I will plan to see him back in 2 months to check on his progress.  Scott Nakayama, MD [facility] [phone]

## 2016-08-04 ENCOUNTER — Emergency Department: Payer: Medicare HMO

## 2016-08-04 ENCOUNTER — Telehealth: Payer: Self-pay

## 2016-08-04 ENCOUNTER — Observation Stay
Admission: EM | Admit: 2016-08-04 | Discharge: 2016-08-09 | Disposition: A | Discharge status: Home / Self Care | Payer: Medicare HMO | Attending: Internal Medicine | Admitting: Internal Medicine

## 2016-08-04 DIAGNOSIS — Z86718 Personal history of other venous thrombosis and embolism: Secondary | ICD-10-CM | POA: Diagnosis not present

## 2016-08-04 DIAGNOSIS — R064 Hyperventilation: Secondary | ICD-10-CM | POA: Insufficient documentation

## 2016-08-04 DIAGNOSIS — F410 Panic disorder [episodic paroxysmal anxiety] without agoraphobia: Secondary | ICD-10-CM | POA: Diagnosis not present

## 2016-08-04 DIAGNOSIS — R609 Edema, unspecified: Secondary | ICD-10-CM

## 2016-08-04 DIAGNOSIS — E785 Hyperlipidemia, unspecified: Secondary | ICD-10-CM | POA: Diagnosis not present

## 2016-08-04 DIAGNOSIS — I110 Hypertensive heart disease with heart failure: Secondary | ICD-10-CM | POA: Insufficient documentation

## 2016-08-04 DIAGNOSIS — Z951 Presence of aortocoronary bypass graft: Secondary | ICD-10-CM | POA: Insufficient documentation

## 2016-08-04 DIAGNOSIS — I252 Old myocardial infarction: Secondary | ICD-10-CM | POA: Insufficient documentation

## 2016-08-04 DIAGNOSIS — Z7901 Long term (current) use of anticoagulants: Secondary | ICD-10-CM | POA: Diagnosis not present

## 2016-08-04 DIAGNOSIS — R269 Unspecified abnormalities of gait and mobility: Secondary | ICD-10-CM | POA: Diagnosis not present

## 2016-08-04 DIAGNOSIS — I2699 Other pulmonary embolism without acute cor pulmonale: Secondary | ICD-10-CM | POA: Insufficient documentation

## 2016-08-04 DIAGNOSIS — Z87891 Personal history of nicotine dependence: Secondary | ICD-10-CM | POA: Insufficient documentation

## 2016-08-04 DIAGNOSIS — F419 Anxiety disorder, unspecified: Secondary | ICD-10-CM | POA: Diagnosis not present

## 2016-08-04 DIAGNOSIS — R0602 Shortness of breath: Secondary | ICD-10-CM | POA: Diagnosis not present

## 2016-08-04 DIAGNOSIS — I25709 Atherosclerosis of coronary artery bypass graft(s), unspecified, with unspecified angina pectoris: Secondary | ICD-10-CM | POA: Diagnosis present

## 2016-08-04 DIAGNOSIS — R600 Localized edema: Secondary | ICD-10-CM | POA: Diagnosis not present

## 2016-08-04 DIAGNOSIS — I480 Paroxysmal atrial fibrillation: Secondary | ICD-10-CM

## 2016-08-04 DIAGNOSIS — F329 Major depressive disorder, single episode, unspecified: Secondary | ICD-10-CM | POA: Diagnosis not present

## 2016-08-04 DIAGNOSIS — I255 Ischemic cardiomyopathy: Secondary | ICD-10-CM | POA: Diagnosis not present

## 2016-08-04 DIAGNOSIS — I25118 Atherosclerotic heart disease of native coronary artery with other forms of angina pectoris: Secondary | ICD-10-CM | POA: Diagnosis not present

## 2016-08-04 DIAGNOSIS — I340 Nonrheumatic mitral (valve) insufficiency: Secondary | ICD-10-CM | POA: Diagnosis not present

## 2016-08-04 DIAGNOSIS — I2510 Atherosclerotic heart disease of native coronary artery without angina pectoris: Secondary | ICD-10-CM | POA: Insufficient documentation

## 2016-08-04 DIAGNOSIS — I639 Cerebral infarction, unspecified: Secondary | ICD-10-CM

## 2016-08-04 DIAGNOSIS — R0600 Dyspnea, unspecified: Secondary | ICD-10-CM | POA: Diagnosis not present

## 2016-08-04 DIAGNOSIS — Z79899 Other long term (current) drug therapy: Secondary | ICD-10-CM | POA: Insufficient documentation

## 2016-08-04 DIAGNOSIS — R079 Chest pain, unspecified: Secondary | ICD-10-CM

## 2016-08-04 DIAGNOSIS — Z955 Presence of coronary angioplasty implant and graft: Secondary | ICD-10-CM | POA: Insufficient documentation

## 2016-08-04 DIAGNOSIS — H538 Other visual disturbances: Secondary | ICD-10-CM | POA: Insufficient documentation

## 2016-08-04 DIAGNOSIS — I5022 Chronic systolic (congestive) heart failure: Secondary | ICD-10-CM | POA: Insufficient documentation

## 2016-08-04 DIAGNOSIS — R69 Illness, unspecified: Secondary | ICD-10-CM | POA: Diagnosis not present

## 2016-08-04 DIAGNOSIS — M9689 Other intraoperative and postprocedural complications and disorders of the musculoskeletal system: Secondary | ICD-10-CM

## 2016-08-04 DIAGNOSIS — R262 Difficulty in walking, not elsewhere classified: Secondary | ICD-10-CM | POA: Diagnosis not present

## 2016-08-04 DIAGNOSIS — I501 Left ventricular failure: Secondary | ICD-10-CM | POA: Diagnosis present

## 2016-08-04 DIAGNOSIS — R0789 Other chest pain: Secondary | ICD-10-CM | POA: Diagnosis not present

## 2016-08-04 LAB — PROTIME-INR
INR: 1.25
Prothrombin Time: 15.8 seconds — ABNORMAL HIGH (ref 11.4–15.2)

## 2016-08-04 LAB — BASIC METABOLIC PANEL
ANION GAP: 10 (ref 5–15)
BUN: 17 mg/dL (ref 6–20)
CHLORIDE: 106 mmol/L (ref 101–111)
CO2: 22 mmol/L (ref 22–32)
Calcium: 9.2 mg/dL (ref 8.9–10.3)
Creatinine, Ser: 1.19 mg/dL (ref 0.61–1.24)
GFR calc Af Amer: >60 mL/min (ref >60)
GFR, EST NON AFRICAN AMERICAN: 58 mL/min — AB (ref >60)
GLUCOSE: 86 mg/dL (ref 65–99)
POTASSIUM: 3.8 mmol/L (ref 3.5–5.1)
Sodium: 138 mmol/L (ref 135–145)

## 2016-08-04 LAB — CBC
HEMATOCRIT: 38.6 % — AB (ref 40.0–52.0)
HEMOGLOBIN: 12.6 g/dL — AB (ref 13.0–18.0)
MCH: 25.6 pg — ABNORMAL LOW (ref 26.0–34.0)
MCHC: 32.8 g/dL (ref 32.0–36.0)
MCV: 78.1 fL — AB (ref 80.0–100.0)
Platelets: 265 10*3/uL (ref 150–440)
RBC: 4.94 MIL/uL (ref 4.40–5.90)
RDW: 16 % — ABNORMAL HIGH (ref 11.5–14.5)
WBC: 6.1 10*3/uL (ref 3.8–10.6)

## 2016-08-04 LAB — APTT
APTT: 140 s — AB (ref 24–36)
aPTT: 33 seconds (ref 24–36)

## 2016-08-04 LAB — BLOOD GAS, VENOUS
ACID-BASE EXCESS: 3.7 mmol/L — AB (ref 0.0–2.0)
BICARBONATE: 25.7 mmol/L (ref 20.0–28.0)
Delivery systems: POSITIVE
FIO2: 0.3
PCO2 VEN: 30 mmHg — AB (ref 44.0–60.0)
Patient temperature: 37
pH, Ven: 7.54 — ABNORMAL HIGH (ref 7.250–7.430)

## 2016-08-04 LAB — HEPARIN LEVEL (UNFRACTIONATED)
HEPARIN UNFRACTIONATED: 3.14 [IU]/mL — AB (ref 0.30–0.70)
Heparin Unfractionated: 1.97 IU/mL — ABNORMAL HIGH (ref 0.30–0.70)

## 2016-08-04 LAB — TROPONIN I
Troponin I: <0.03 ng/mL (ref <0.03)
Troponin I: <0.03 ng/mL (ref <0.03)
Troponin I: <0.03 ng/mL (ref <0.03)

## 2016-08-04 MED ORDER — RAMIPRIL 2.5 MG PO CAPS
2.5000 mg | ORAL_CAPSULE | Freq: Every day | ORAL | Status: DC
  Administered 2016-08-05 – 2016-08-07 (×3): 2.5 mg via ORAL
  Filled 2016-08-04 (×5): qty 1

## 2016-08-04 MED ORDER — CARVEDILOL 3.125 MG PO TABS
3.1250 mg | ORAL_TABLET | Freq: Every day | ORAL | Status: DC
  Administered 2016-08-05 – 2016-08-09 (×5): 3.125 mg via ORAL
  Filled 2016-08-04 (×5): qty 1

## 2016-08-04 MED ORDER — ONDANSETRON HCL 4 MG/2ML IJ SOLN: 4.0000 mg | Freq: Four times a day (QID) | INTRAMUSCULAR | Status: DC | PRN

## 2016-08-04 MED ORDER — PNEUMOCOCCAL VAC POLYVALENT 25 MCG/0.5ML IJ INJ
0.5000 mL | INJECTION | INTRAMUSCULAR | Status: DC
  Filled 2016-08-04: qty 0.5

## 2016-08-04 MED ORDER — NITROGLYCERIN 0.4 MG SL SUBL
0.4000 mg | SUBLINGUAL_TABLET | SUBLINGUAL | Status: DC | PRN
  Administered 2016-08-04 (×3): 0.4 mg via SUBLINGUAL
  Filled 2016-08-04 (×2): qty 1

## 2016-08-04 MED ORDER — HEPARIN SODIUM (PORCINE) 1000 UNIT/ML IJ SOLN
INTRAMUSCULAR | Status: AC
  Administered 2016-08-04: 4000 [IU]
  Filled 2016-08-04: qty 1

## 2016-08-04 MED ORDER — MORPHINE SULFATE (PF) 2 MG/ML IV SOLN: 1.0000 mg | INTRAVENOUS | Status: DC | PRN

## 2016-08-04 MED ORDER — ATORVASTATIN CALCIUM 20 MG PO TABS
80.0000 mg | ORAL_TABLET | Freq: Every day | ORAL | Status: DC
  Administered 2016-08-04 – 2016-08-08 (×5): 80 mg via ORAL
  Filled 2016-08-04 (×5): qty 4

## 2016-08-04 MED ORDER — NITROGLYCERIN 2 % TD OINT
1.0000 [in_us] | TOPICAL_OINTMENT | Freq: Four times a day (QID) | TRANSDERMAL | Status: DC
  Administered 2016-08-04: 1 [in_us] via TOPICAL

## 2016-08-04 MED ORDER — ONDANSETRON HCL 4 MG PO TABS: 4.0000 mg | ORAL_TABLET | Freq: Four times a day (QID) | ORAL | Status: DC | PRN

## 2016-08-04 MED ORDER — ALPRAZOLAM 0.5 MG PO TABS: 0.5000 mg | ORAL_TABLET | Freq: Two times a day (BID) | ORAL | Status: DC

## 2016-08-04 MED ORDER — HEPARIN (PORCINE) IN NACL 100-0.45 UNIT/ML-% IJ SOLN
950.0000 [IU]/h | INTRAMUSCULAR | Status: DC
  Administered 2016-08-04: 950 [IU]/h via INTRAVENOUS
  Filled 2016-08-04: qty 250

## 2016-08-04 MED ORDER — NITROGLYCERIN 2 % TD OINT
TOPICAL_OINTMENT | TRANSDERMAL | Status: AC
  Administered 2016-08-04: 1 [in_us] via TOPICAL
  Filled 2016-08-04: qty 1

## 2016-08-04 MED ORDER — HEPARIN SODIUM (PORCINE) 5000 UNIT/ML IJ SOLN: 4000.0000 [IU] | INTRAMUSCULAR | Status: DC

## 2016-08-04 MED ORDER — NITROGLYCERIN 2 % TD OINT
0.5000 [in_us] | TOPICAL_OINTMENT | Freq: Four times a day (QID) | TRANSDERMAL | Status: DC
  Administered 2016-08-04 – 2016-08-07 (×11): 0.5 [in_us] via TOPICAL
  Filled 2016-08-04 (×11): qty 1

## 2016-08-04 MED ORDER — ALPRAZOLAM 0.5 MG PO TABS
0.5000 mg | ORAL_TABLET | Freq: Two times a day (BID) | ORAL | Status: DC
  Administered 2016-08-04 – 2016-08-09 (×10): 0.5 mg via ORAL
  Filled 2016-08-04 (×10): qty 1

## 2016-08-04 MED ORDER — LORAZEPAM 2 MG/ML IJ SOLN
2.0000 mg | INTRAMUSCULAR | Status: DC | PRN
  Administered 2016-08-04: 2 mg via INTRAVENOUS
  Administered 2016-08-04: 1 mg via INTRAVENOUS
  Administered 2016-08-05 – 2016-08-08 (×4): 2 mg via INTRAVENOUS
  Filled 2016-08-04 (×7): qty 1

## 2016-08-04 MED ORDER — IBUPROFEN 400 MG PO TABS
400.0000 mg | ORAL_TABLET | Freq: Four times a day (QID) | ORAL | Status: DC | PRN
  Administered 2016-08-04: 400 mg via ORAL
  Filled 2016-08-04: qty 1

## 2016-08-04 NOTE — ED Notes (Addendum)
Heparin verified with Mickel Baas, RN.  Defib pads placed on patient.

## 2016-08-04 NOTE — Progress Notes (Signed)
Patient c/o chest pain of 6/10 on the pain scale. NTG sublingual administered x 3 without any relief. Dr. Jannifer Franklin notified with a new order for Morphine  as shown in the [facility]. Patient was snoring when the Morphine was ready to be administered. 02 sat was 100 % on room. 02 at 2L/[place] applied for comfort when the patient c/o SOB. Will continue to monitor.

## 2016-08-04 NOTE — H&P (Signed)
[place] [place] NAME: Scott [name]    MR#:  [id number]  DATE OF BIRTH:  Jun 02, 1940  DATE OF ADMISSION:  08/04/2016  PRIMARY CARE PHYSICIAN: Madelyn Brunner, MD   REQUESTING/REFERRING PHYSICIAN: Dr. Archie Balboa  CHIEF COMPLAINT:Chest pain and shortness of breath   Chief Complaint  Patient presents with  . Shortness of Breath  . Chest Pain    HISTORY OF PRESENT ILLNESS:  Scott [name]  is a 76 y.o. male with a known history of Hypertension, severe anxiety, history of CAD CABG 2 in [facility], history of proximal atrial fibrillation on Epic; comes in with chest pain, shortness of breath getting gradually worse since yesterday. Has midsternal chest pain and shortness of breath, brought in by EMS. In the emergency room patient had severe chest pain and shortness of breath started on BiPAP. She is on minimal sitting on BiPAP. First set of troponins are negative. I spoke with Dr. Sherlean Foot who is on-call for [facility] cardiology, and with the primary cardiologist for patient he recommended to check the second troponins if they are negative we will discontinue heparin drip as per his recommendation. And I spoke with RT to see if we can wean off the BiPAP and check nasal cannula Essentially this patient has severe anxiety and keeps having evaluations for chest and multiple times with negative workup. 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  PAST MEDICAL HISTORY:   Past Medical History:  Diagnosis Date  . Arthritis    right hip, since a fall  . CAD (coronary artery disease)    a. 04/2012 Cath: 3VD->Med Rx;  b. 04/2013 PCI RCA (2 DES); c. 03/2014 PCI: LAD 80 (3.0x23 Xience Alpine DES), 30 ISR in RCA; d. 06/2014 CABG x 2 ([facility]) LIMA->LAD, VG->OM; e. 11/2014 Neg MV;  f. 12/2014 Cath ([facility]): patent grafts->Med Rx; g. 03/2015 MV ([facility]) low risk, EF 40%; h. 05/2015 MV: low risk; i. 08/2015 Cath: patent grafts, patent RCA/LAD stents, small  LCX/OM->Med Rx.  . Cardiomyopathy, ischemic    a. 04/2012 Echo: EF 40-45%;  b. 08/2013 Echo: EF 45-50%, mild glob HK, mod lat/post HK, diast dysfxn, mildly dil LA, mild MR, nl RVSP; c. 01/2015 Echo: EF 40-45%, Gr 1 DD, mildly dil LA.  . Carotid arterial disease ([place])    a. 05/2013 Carotid U/S; bilat 40-50% ICA stenosis.  . Chronic Chest Pain   . Chronic systolic CHF (congestive heart failure) ([place])    a. 01/2015 Echo: EF 40-45%.  . Cough   . Dyspnea    pt. unsure of exertion cause- that creates SOB at times   . Headache 1970's   migraine  . History of blood transfusion 2016   post op  . Hyperlipidemia   . Hypertension   . Myocardial infarction [facility])    unsure of when    PAST SURGICAL HISTOIRY:   Past Surgical History:  Procedure Laterality Date  . CARDIAC CATHETERIZATION  05/01/2012  . CARDIAC CATHETERIZATION  04/2013   [facility];x3 stent  . CARDIAC CATHETERIZATION  01/11/15    [facility]  . CARDIAC CATHETERIZATION N/A 09/16/2015   Procedure: LEFT HEART CATH AND CORS/GRAFTS ANGIOGRAPHY;  Surgeon: Minna Merritts, MD;  Location: [place] CV LAB;  Service: Cardiovascular;  Laterality: N/A;  . CARPAL TUNNEL RELEASE     right hand  . CATARACT EXTRACTION     LEFT  . CATARACT EXTRACTION W/PHACO Left 09/16/2014   Procedure: CATARACT EXTRACTION  PHACO AND INTRAOCULAR LENS PLACEMENT (IOC);  Surgeon: Leandrew Koyanagi, MD;  Location: [facility];  Service: Ophthalmology;  Laterality: Left;  . COLONOSCOPY    . COLONOSCOPY WITH PROPOFOL N/A 01/28/2016   Procedure: COLONOSCOPY WITH PROPOFOL;  Surgeon: Lollie Sails, MD;  Location: [facility] ENDOSCOPY;  Service: Endoscopy;  Laterality: N/A;  . CORONARY ANGIOPLASTY WITH STENT PLACEMENT  04/13/2014  . CORONARY ARTERY BYPASS GRAFT  06-26-14   x3 bypasses  . DE QUERVAIN'S RELEASE Left 08/22/2012  . ESOPHAGOGASTRODUODENOSCOPY (EGD) WITH PROPOFOL N/A 04/26/2015   Procedure: ESOPHAGOGASTRODUODENOSCOPY (EGD) WITH PROPOFOL;  Surgeon: Hulen Luster, MD;   Location: [facility] ENDOSCOPY;  Service: Gastroenterology;  Laterality: N/A;  . RIB PLATING N/A 05/25/2016   Procedure: STERNAL PLATING;  Surgeon: Melrose Nakayama, MD;  Location: [facility];  Service: Thoracic;  Laterality: N/A;  . right shoulder    . STERNAL WIRES REMOVAL N/A 03/30/2016   Procedure: STERNAL WIRES REMOVAL;  Surgeon: Melrose Nakayama, MD;  Location: [facility];  Service: Thoracic;  Laterality: N/A;  . TONSILLECTOMY      SOCIAL HISTORY:   Social History  Substance Use Topics  . Smoking status: Former Smoker    Packs/day: 1.00    Years: 35.00    Types: Cigarettes    Start date: 05/02/2009  . Smokeless tobacco: Never Used     Comment: stopped 10 yrs ago  . Alcohol use No    FAMILY HISTORY:   Family History  Problem Relation Age of Onset  . Heart attack Father 16       MI  . Cancer Maternal Uncle     DRUG ALLERGIES:   Allergies  Allergen Reactions  . Contrast Media [Iodinated Diagnostic Agents] Hives  . Oxycodone Hcl Itching  . Tramadol Itching  . Vicodin [Hydrocodone-Acetaminophen] Itching    REVIEW OF SYSTEMS:  CONSTITUTIONAL:Severe anxiety, hyperventilating.  EYES: No blurred or double vision.  EARS, NOSE, AND THROAT: No tinnitus or ear pain.  RESPIRATORY: No cough,shortness of breath, chest pain  CARDIOVASCULAR: No chest pain, orthopnea, edema.  GASTROINTESTINAL: No nausea, vomiting, diarrhea or abdominal pain.  GENITOURINARY: No dysuria, hematuria.  ENDOCRINE: No polyuria, nocturia,  HEMATOLOGY: No anemia, easy bruising or bleeding SKIN: No rash or lesion. MUSCULOSKELETAL: No joint pain or arthritis.   NEUROLOGIC: No tingling, numbness, weakness.  PSYCHIATRY: No anxiety or depression.   MEDICATIONS AT HOME:   Prior to Admission medications   Medication Sig Start Date End Date Taking? Authorizing Provider  apixaban (ELIQUIS) 5 MG TABS tablet Take 1 tablet (5 mg total) by mouth 2 (two) times daily. 05/27/16  Yes Lars Pinks M, PA-C  atorvastatin  (LIPITOR) 80 MG tablet Take 80 mg by mouth at bedtime.   Yes [provider]  carvedilol (COREG) 3.125 MG tablet Take 1 tablet (3.125 mg total) by mouth daily before breakfast. 05/15/16  Yes Gollan, [name], MD  isosorbide mononitrate (IMDUR) 60 MG 24 hr tablet Take 60 mg by mouth daily before breakfast. Before breakfast    Yes [provider]  nitroGLYCERIN (NITROSTAT) 0.4 MG SL tablet Place 1 tablet (0.4 mg total) under the tongue every 5 (five) minutes as needed for chest pain. 03/29/15  Yes Minna Merritts, MD  ramipril (ALTACE) 2.5 MG capsule Take 1 capsule (2.5 mg total) by mouth daily. 08/26/15  Yes Wellington Hampshire, MD  traMADol (ULTRAM) 50 MG tablet Take 1-2 tablets (50-100 mg total) by mouth every 6 (six) hours as needed for moderate pain. 06/30/16  Melrose Nakayama, MD  zolpidem (AMBIEN) 5 MG tablet Take 5 mg by mouth at bedtime as needed for sleep.     [provider]      VITAL SIGNS:  Blood pressure (!) 151/78, pulse 67, temperature 97.9 F (36.6 C), temperature source Axillary, resp. rate 19, weight 79.4 kg (175 lb), SpO2 100 %.  PHYSICAL EXAMINATION:  GENERAL:  76 y.o.-year-old patient lying in the bed with no acute distress.  EYES: Pupils equal, round, reactive to light and accommodation. No scleral icterus. Extraocular muscles intact.  HEENT: Head atraumatic, normocephalic. Oropharynx and nasopharynx clear.  NECK:  Supple, no jugular venous distention. No thyroid enlargement, no tenderness.  LUNGS: Normal breath sounds bilaterally, no wheezing, rales,rhonchi or crepitation. No use of accessory muscles of respiration.  CARDIOVASCULAR: S1, S2 normal. No murmurs, rubs, or gallops.  ABDOMEN: Soft, nontender, nondistended. Bowel sounds present. No organomegaly or mass.  EXTREMITIES: No pedal edema, cyanosis, or clubbing.  NEUROLOGIC: Cranial nerves II through XII are intact. Muscle strength 5/5 in all extremities. Sensation intact. Gait not checked.   PSYCHIATRIC: The patient is alert and oriented x 3.  SKIN: No obvious rash, lesion, or ulcer.   LABORATORY PANEL:   CBC  Recent Labs Lab 08/04/16 1410  WBC 6.1  HGB 12.6*  HCT 38.6*  PLT 265   ------------------------------------------------------------------------------------------------------------------  Chemistries   Recent Labs Lab 08/04/16 1410  NA 138  K 3.8  CL 106  CO2 22  GLUCOSE 86  BUN 17  CREATININE 1.19  CALCIUM 9.2   ------------------------------------------------------------------------------------------------------------------  Cardiac Enzymes  Recent Labs Lab 08/04/16 1410  TROPONINI <0.03   ------------------------------------------------------------------------------------------------------------------  RADIOLOGY:  Dg Chest Portable 1 View  Result Date: 08/04/2016 CLINICAL DATA:  Shortness of breath and chest pain today. EXAM: PORTABLE CHEST 1 VIEW COMPARISON:  CT chest 05/09/2016. PA and lateral chest 07/25/2016 and 06/06/2016. FINDINGS: The lungs are clear. Heart size is normal. No pneumothorax or pleural effusion. The patient is status post CABG. No acute bony abnormality. IMPRESSION: No acute disease. Electronically Signed   By: Inge Rise M.D.   On: 08/04/2016 14:24    EKG:   Orders placed or performed during the hospital encounter of 08/04/16  . ED EKG within 10 minutes  . ED EKG within 10 minutes  . EKG 12-Lead  . EKG 12-Lead  . ED EKG  . ED EKG  . EKG 12-Lead  . EKG 12-Lead   Normal sinus rhythm 68 bpm, no ST T changes.  IMPRESSION AND PLAN:   Chest pain, shortness of breath with negative workup including negative chest x-ray, normal cardiac markers, normal EKG and normal blood work symptoms are consistent with anxiety and a rather than ACS. Because of history of heart disease monitor him on telemetry, check troponins 2 more times, seen by Dr. Rockey Situ. continue patient on aspirin, beta blockers, statins, nitrates,   anxiety: Use Ativan in the hospital, discharged home with SSRIs and anxiety medicine and possible psychiatric consult while in the hospital  All the records are reviewed and case discussed with ED provider. Management plans discussed with the patient, family and they are in agreement.  CODE STATUS: full  TOTAL TIME TAKING CARE OF THIS PATIENT: 55 minutes.    Epifanio Lesches M.D on 08/04/2016 at 3:36 PM  Between 7am to 6pm - Pager - [phone]  After 6pm go to [email or web address] - password [email or web address]  [facility]  Office  [phone]  CC: Primary care physician; Madelyn Brunner, MD  Note: This dictation was prepared with Dragon dictation along with smaller phrase technology. Any transcriptional errors that result from this process are unintentional.

## 2016-08-04 NOTE — ED Triage Notes (Signed)
Pt arrives to ER via [facility] from home c/o SOB. EMS arrived and pt VSS. Pt then began to c/o chest pain and gripping at chest, with these episodes HR became bradycardic. Pt placed on CPAP and arrives on CPAP. CBG normal. Pt given 1 nitro spray and 324 ASA with EMS. Pt took 2 nitro tablets prior to EMS arrival. 18G to right hand by EMS. EDP at bedside. Color WNL

## 2016-08-04 NOTE — ED Notes (Signed)
Attempt to call report X 1, RN unable to take at this time. Awaiting callback.

## 2016-08-04 NOTE — ED Notes (Signed)
Cardiology and RT at bedside- 

## 2016-08-04 NOTE — ED Notes (Signed)
Report to Jancy, RN  

## 2016-08-04 NOTE — Progress Notes (Signed)
ANTICOAGULATION CONSULT NOTE - Initial Consult  Pharmacy Consult for heparin drip dosing and monitoring  Indication: chest pain/ACS  Allergies  Allergen Reactions  . Contrast Media [Iodinated Diagnostic Agents] Hives  . Oxycodone Hcl Itching  . Tramadol Itching  . Vicodin [Hydrocodone-Acetaminophen] Itching    Patient Measurements: Weight: 175 lb (79.4 kg) Vital Signs: Temp: 97.9 F (36.6 C) (07/13 1413) Temp Source: Axillary (07/13 1413) BP: 186/78 (07/13 1413) Pulse Rate: 71 (07/13 1413)  Labs: No results for input(s): HGB, HCT, PLT, APTT, LABPROT, INR, HEPARINUNFRC, HEPRLOWMOCWT, CREATININE, CKTOTAL, CKMB, TROPONINI in the last 72 hours.  CrCl cannot be calculated (Patient's most recent lab result is older than the maximum 21 days allowed.).   Medical History: Past Medical History:  Diagnosis Date  . Arthritis    right hip, since a fall  . CAD (coronary artery disease)    a. 04/2012 Cath: 3VD->Med Rx;  b. 04/2013 PCI RCA (2 DES); c. 03/2014 PCI: LAD 80 (3.0x23 Xience Alpine DES), 30 ISR in RCA; d. 06/2014 CABG x 2 ([facility]) LIMA->LAD, VG->OM; e. 11/2014 Neg MV;  f. 12/2014 Cath ([facility]): patent grafts->Med Rx; g. 03/2015 MV ([facility]) low risk, EF 40%; h. 05/2015 MV: low risk; i. 08/2015 Cath: patent grafts, patent RCA/LAD stents, small LCX/OM->Med Rx.  . Cardiomyopathy, ischemic    a. 04/2012 Echo: EF 40-45%;  b. 08/2013 Echo: EF 45-50%, mild glob HK, mod lat/post HK, diast dysfxn, mildly dil LA, mild MR, nl RVSP; c. 01/2015 Echo: EF 40-45%, Gr 1 DD, mildly dil LA.  . Carotid arterial disease ([place])    a. 05/2013 Carotid U/S; bilat 40-50% ICA stenosis.  . Chronic Chest Pain   . Chronic systolic CHF (congestive heart failure) ([place])    a. 01/2015 Echo: EF 40-45%.  . Cough   . Dyspnea    pt. unsure of exertion cause- that creates SOB at times   . Headache 1970's   migraine  . History of blood transfusion 2016   post op  . Hyperlipidemia   . Hypertension   . Myocardial infarction [facility])     unsure of when   Assessment: 76 yo male with PMH of CAD and CABG. Pharmacy consulted for heparin dosing and monitoring for Chest pain/ACS. According to patient's record, they were taking apixaban PTA. Patient reports last dose at 0800 this morning 7/13.   Goal of Therapy:  Heparin level 0.3-0.7 units/ml aPTT 66-102s seconds Monitor platelets by anticoagulation protocol: Yes   Plan:  Baseline labs ordered Dr. Archie Balboa wants to continue with full bolus.  heparin 4000 units bolus x 1 ordered.  Start heparin infusion at 950 units/hr Will need to adjust infusion based on aPTT levels un apixaban effect has diminished and aPTT levels and Anti-Xa levels correlate.  Check aPTT and anti-Xa level in 8 hours and daily while on heparin Continue to monitor H&H and platelets  Pernell Dupre, PharmD, BCPS Clinical Pharmacist 08/04/2016 2:29 PM

## 2016-08-04 NOTE — Progress Notes (Signed)
Patient is complaining of headache and needed pain medication but he's allergic to most pain medications. Dr. Vianne Bulls notified with a new order for Ibuprofen 400 mg every 6 hours as needed for pain. Will continue to monitor.

## 2016-08-04 NOTE — ED Notes (Signed)
Pt unable to answer most questions due to having BIPAP mask on face.

## 2016-08-04 NOTE — ED Notes (Addendum)
Pt wife out of room states that pt is having trouble breathing. When RN into room, pt gasping with mouth open for air on BIPAP machine. States that when this occurs CP increases as well. PT has intermittent periods of this rapid increase of breathing and CP. EDP at bedside. Orders received. Pt back to breathing at the baseline of arrival.

## 2016-08-04 NOTE — ED Notes (Signed)
Wife at bedside states that pt called her earlier today and stated that he was SOB. Pt was outside working, walking, which is he usual. Pt neck and face reddened.

## 2016-08-04 NOTE — Telephone Encounter (Signed)
Scott [name] states that he has been having chest pain and SOB x 2 days but today it has gotten worse.  I instructed him to go to the ED for eval ASAP or call 911. He said that he would go the [facility] now.

## 2016-08-04 NOTE — ED Notes (Signed)
Awaiting heparin from pharmacy.

## 2016-08-04 NOTE — Consult Note (Signed)
Cardiology Consultation Note  Patient ID: Scott [name], MRN: [id number], DOB/AGE: Nov 22, 1940 76 y.o. Admit date: 08/04/2016   Date of Consult: 08/04/2016 Primary Physician: Madelyn Brunner, MD Primary Cardiologist: Dr. Rockey Situ, MD Requesting Physician: Dr. Vianne Bulls, MD  Chief Complaint: SOB Reason for Consult: SOB  HPI: Scott [name] is a 76 y.o. male who is being seen today for the evaluation of SOB at the request of Dr. Vianne Bulls, MD. Patient has a h/o CAD s/p CABG x 2 at [facility] in 2016 with chronic chest pain for well over one year without revealing cardiac workup, s/p removal of sternal wire without relief, s/p sternal debridement and plating on 05/25/2016 in the setting of CT chest performed by pain management showing sternal nonunion of the manubrium with subsequent improvement in his pain. Also with ICM with EF of 40-45% by echo in 1.2017, PAF on Eliquis, carotid artery disease, HTN, HLD, and anxiety who presented to [facility] on 7/13 with a 3-4 day history of increased SOB that is noticed in the afternoons.   Cardiac cath in 04/2012 showed severe RCA disease with left-to-right collaterals, subtotally occluded LCx, and moderately diffuse LAD disease with medical management advised. Repeat LHC in 04/2013 with PCI/DES to the RCA. Repeat LHC in 03/2014 with PCI/DES to the mid LAD. Seen by [facility] in 05/2014 for 2nd opinion with cath showing 50% LAD stenosis and distal RCA disease. He underwent 2-vessel CABG in 06/2014 at [facility] Follow up stress test in 11/2014 without ischemia. Cath at [facility] in 12/2014 without significant change. Multiple hospital admissions, ED visits, and office visits for chronic chest pain. Referred to pain management in [place]. CT chest performed by pain management showed sternal nonunion of the manubrium. He underwent sternal wire removal in 03/2016 followed by redo sternotomy with sternal plating on 05/25/2016 with improvement in his chest pain. He was most recently seen by [facility] on 7/3 with  continued intermittent chest pain not felt to be acute. He was advised to slowly increase activities. He started raking his yard, picking up limbs, and mowing the nearly 2 acres of grass he has. Over the past 3-4 days he has been noting increased SOB, mostly in the afternoons only. He feels well in the mornings when he gets up. There has been some associated chest tightness, though not as bad as prior episodes. He called [facility] today about his SOB and was advised to come to the ED. Upon his arrival to the ED he was noted to be SOB. VBG with pCO2 of 30. He was placed on BiPAP and pads were applied. He was started on a heparin gtt. First troponin has come back negative. He has since been weaned off BiPAP and is on nasal cannula at 3 L. CBC and BMET stable. Multiple family members present in the room.     Past Medical History:  Diagnosis Date  . Arthritis    right hip, since a fall  . CAD (coronary artery disease)    a. 04/2012 Cath: 3VD->Med Rx;  b. 04/2013 PCI RCA (2 DES); c. 03/2014 PCI: LAD 80 (3.0x23 Xience [employer]pine DES), 30 ISR in RCA; d. 06/2014 CABG x 2 ([facility]) LIMA->LAD, VG->OM; e. 11/2014 Neg MV;  f. 12/2014 Cath ([facility]): patent grafts->Med Rx; g. 03/2015 MV ([facility]) low risk, EF 40%; h. 05/2015 MV: low risk; i. 08/2015 Cath: patent grafts, patent RCA/LAD stents, small LCX/OM->Med Rx.  . Cardiomyopathy, ischemic    a. 04/2012 Echo: EF 40-45%;  b. 08/2013 Echo: EF 45-50%,  mild glob HK, mod lat/post HK, diast dysfxn, mildly dil LA, mild MR, nl RVSP; c. 01/2015 Echo: EF 40-45%, Gr 1 DD, mildly dil LA.  . Carotid arterial disease ([place])    a. 05/2013 Carotid U/S; bilat 40-50% ICA stenosis.  . Chronic Chest Pain   . Chronic systolic CHF (congestive heart failure) ([place])    a. 01/2015 Echo: EF 40-45%.  . Cough   . Dyspnea    pt. unsure of exertion cause- that creates SOB at times   . Headache 1970's   migraine  . History of blood transfusion 2016   post op  . Hyperlipidemia   . Hypertension   . Myocardial  infarction [facility])    unsure of when      Most Recent Cardiac Studies: As above   Surgical History:  Past Surgical History:  Procedure Laterality Date  . CARDIAC CATHETERIZATION  05/01/2012  . CARDIAC CATHETERIZATION  04/2013   [facility];x3 stent  . CARDIAC CATHETERIZATION  01/11/15    [facility]  . CARDIAC CATHETERIZATION N/A 09/16/2015   Procedure: LEFT HEART CATH AND CORS/GRAFTS ANGIOGRAPHY;  Surgeon: Minna Merritts, MD;  Location: [facility];  Service: Cardiovascular;  Laterality: N/A;  . CARPAL TUNNEL RELEASE     right hand  . CATARACT EXTRACTION     LEFT  . CATARACT EXTRACTION W/PHACO Left 09/16/2014   Procedure: CATARACT EXTRACTION PHACO AND INTRAOCULAR LENS PLACEMENT (IOC);  Surgeon: Leandrew Koyanagi, MD;  Location: [facility];  Service: Ophthalmology;  Laterality: Left;  . COLONOSCOPY    . COLONOSCOPY WITH PROPOFOL N/A 01/28/2016   Procedure: COLONOSCOPY WITH PROPOFOL;  Surgeon: Lollie Sails, MD;  Location: [facility] ENDOSCOPY;  Service: Endoscopy;  Laterality: N/A;  . CORONARY ANGIOPLASTY WITH STENT PLACEMENT  04/13/2014  . CORONARY ARTERY BYPASS GRAFT  06-26-14   x3 bypasses  . DE QUERVAIN'S RELEASE Left 08/22/2012  . ESOPHAGOGASTRODUODENOSCOPY (EGD) WITH PROPOFOL N/A 04/26/2015   Procedure: ESOPHAGOGASTRODUODENOSCOPY (EGD) WITH PROPOFOL;  Surgeon: Hulen Luster, MD;  Location: [facility] ENDOSCOPY;  Service: Gastroenterology;  Laterality: N/A;  . RIB PLATING N/A 05/25/2016   Procedure: STERNAL PLATING;  Surgeon: Melrose Nakayama, MD;  Location: [facility];  Service: Thoracic;  Laterality: N/A;  . right shoulder    . STERNAL WIRES REMOVAL N/A 03/30/2016   Procedure: STERNAL WIRES REMOVAL;  Surgeon: Melrose Nakayama, MD;  Location: [facility];  Service: Thoracic;  Laterality: N/A;  . TONSILLECTOMY       Home Meds: Prior to Admission medications   Medication Sig Start Date End Date Taking? Authorizing Provider  apixaban (ELIQUIS) 5 MG TABS tablet Take 1 tablet (5 mg total) by mouth  2 (two) times daily. 05/27/16  Yes Lars Pinks M, PA-C  atorvastatin (LIPITOR) 80 MG tablet Take 80 mg by mouth at bedtime.   Yes [provider]  carvedilol (COREG) 3.125 MG tablet Take 1 tablet (3.125 mg total) by mouth daily before breakfast. 05/15/16  Yes Gollan, [name], MD  isosorbide mononitrate (IMDUR) 60 MG 24 hr tablet Take 60 mg by mouth daily before breakfast. Before breakfast    Yes [provider]  nitroGLYCERIN (NITROSTAT) 0.4 MG SL tablet Place 1 tablet (0.4 mg total) under the tongue every 5 (five) minutes as needed for chest pain. 03/29/15  Yes Minna Merritts, MD  ramipril (ALTACE) 2.5 MG capsule Take 1 capsule (2.5 mg total) by mouth daily. 08/26/15  Yes Wellington Hampshire, MD  traMADol (ULTRAM) 50 MG tablet Take 1-2  tablets (50-100 mg total) by mouth every 6 (six) hours as needed for moderate pain. 06/30/16   Melrose Nakayama, MD  zolpidem (AMBIEN) 5 MG tablet Take 5 mg by mouth at bedtime as needed for sleep.     [provider]    Inpatient Medications:  . ALPRAZolam  0.5 mg Oral BID  . atorvastatin  80 mg Oral QHS  . [START ON 08/05/2016] carvedilol  3.125 mg Oral QAC breakfast  . heparin  4,000 Units Intravenous STAT  . nitroGLYCERIN  0.5 inch Topical Q6H  . nitroGLYCERIN  1 inch Topical Q6H  . ramipril  2.5 mg Oral Daily   . heparin 950 Units/hr (08/04/16 1451)    Allergies:  Allergies  Allergen Reactions  . Contrast Media [Iodinated Diagnostic Agents] Hives  . Oxycodone Hcl Itching  . Tramadol Itching  . Vicodin [Hydrocodone-Acetaminophen] Itching    Social History   Social History  . Marital status: Married    Spouse name: N/A  . Number of children: N/A  . Years of education: N/A   Occupational History  . Not on file.   Social History Main Topics  . Smoking status: Former Smoker    Packs/day: 1.00    Years: 35.00    Types: Cigarettes    Start date: 05/02/2009  . Smokeless tobacco: Never Used     Comment:  stopped 10 yrs ago  . Alcohol use No  . Drug use: No  . Sexual activity: Yes   Other Topics Concern  . Not on file   Social History Narrative  . No narrative on file     Family History  Problem Relation Age of Onset  . Heart attack Father 24       MI  . Cancer Maternal Uncle      Review of Systems: Review of Systems  Constitutional: Positive for malaise/fatigue. Negative for chills, diaphoresis, fever and weight loss.  HENT: Negative for congestion.   Eyes: Negative for discharge and redness.  Respiratory: Positive for shortness of breath. Negative for cough, hemoptysis, sputum production and wheezing.   Cardiovascular: Positive for chest pain. Negative for palpitations, orthopnea, claudication, leg swelling and PND.  Gastrointestinal: Negative for abdominal pain, blood in stool, heartburn, melena, nausea and vomiting.  Genitourinary: Negative for hematuria.  Musculoskeletal: Negative for falls and myalgias.  Skin: Negative for rash.  Neurological: Positive for weakness. Negative for dizziness, tingling, tremors, sensory change, speech change, focal weakness and loss of consciousness.  Endo/Heme/Allergies: Does not bruise/bleed easily.  Psychiatric/Behavioral: Positive for depression. Negative for substance abuse. The patient is nervous/anxious.   All other systems reviewed and are negative.   Labs:  Recent Labs  08/04/16 1410  TROPONINI <0.03   Lab Results  Component Value Date   WBC 6.1 08/04/2016   HGB 12.6 (L) 08/04/2016   HCT 38.6 (L) 08/04/2016   MCV 78.1 (L) 08/04/2016   PLT 265 08/04/2016     Recent Labs Lab 08/04/16 1410  NA 138  K 3.8  CL 106  CO2 22  BUN 17  CREATININE 1.19  CALCIUM 9.2  GLUCOSE 86   Lab Results  Component Value Date   CHOL 111 06/12/2016   HDL 41 06/12/2016   LDLCALC 53 06/12/2016   TRIG 84 06/12/2016   No results found for: DDIMER  Radiology/Studies:    Dg Chest Portable 1 View  Result Date:  08/04/2016 IMPRESSION: No acute disease. Electronically Signed   By: Inge Rise M.D.   On:  08/04/2016 14:24    EKG: Interpreted by me showed: NSR, 68 bpm, baseline wandering, nonspecific IVCD/st/t changes Telemetry: Interpreted by me showed: sinus rhythm   Weights: Filed Weights   08/04/16 1406  Weight: 175 lb (79.4 kg)     Physical Exam: Blood pressure (!) 119/94, pulse 70, temperature 97.9 F (36.6 C), temperature source Axillary, resp. rate (!) 25, weight 175 lb (79.4 kg), SpO2 100 %. Body mass index is 25.11 kg/m. General: Well developed, well nourished, in no acute distress. Head: Normocephalic, atraumatic, sclera non-icteric, no xanthomas, nares are without discharge. Mild flushing of the head and neck.   Neck: Negative for carotid bruits. JVD not elevated. Lungs: Clear bilaterally to auscultation without wheezes, rales, or rhonchi. Breathing is unlabored. Initially on BiPAP, weaned to nasal cannula at 3 L.  Heart: RRR with S1 S2. No murmurs, rubs, or gallops appreciated. Abdomen: Soft, non-tender, non-distended with normoactive bowel sounds. No hepatomegaly. No rebound/guarding. No obvious abdominal masses. Msk:  Strength and tone appear normal for age. Extremities: No clubbing or cyanosis. No edema. Distal pedal pulses are 2+ and equal bilaterally. Neuro: Alert and oriented X 3. No facial asymmetry. No focal deficit. Moves all extremities spontaneously. Psych:  Responds to questions appropriately with a normal affect.    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 SOB: -Of uncertain etiology at this time -Cannot rule out anxiety/panic attack at this time -Has been weaned off BiPAP, now on nasal cannula at 3 L -Ativan  ordered -May benefit from SSRI/psych consult  -Continue to cycle troponin to rule out, if rules out would stop heparin gtt after 2nd troponin -Less likely PE given he has been anticoagulated with Eliquis -Check TTE -Recent family member passed from an MI, he has been worried about this  2. CAD s/p CABG: -As above -Continue PTA medications  3. ICM: -He does not appear to be volume overloaded -Check TTE  4. PAF: -Maintaining sinus rhythm -Heparin gtt for now -Would restart Eliquis when able -CHADS2VASc at least 4 (CHF, HTN, age x 1, vascular disease)  5. Nonunion of sternum s/p sternotomy" -Has undergone successful sternal plate -Has recently increased his activity to include raking, picking up limbs, and mowing the lawn -Perhaps this is playing a role in some of his symptoms  Case discussed with Drs. Rockey Situ and [place] as well as ED RN.     Signed, Christell Faith, PA-C [place] Pager: [phone] 08/04/2016, 4:04 PM

## 2016-08-04 NOTE — ED Notes (Signed)
Pt denies CP at this time, states that the medication made him feel better. Pt able to talk in full and complete sentences.

## 2016-08-04 NOTE — ED Notes (Addendum)
Pt taken to hospital room by  Larene Beach, RN on telemetry. Heparin infusion continues. Pt belongings taken by wife who accompanies patient. Pt alert and oriented X4, active, cooperative, pt in NAD. RR even and unlabored, color WNL.

## 2016-08-04 NOTE — ED Provider Notes (Signed)
[facility] Emergency Department Provider Note   ____________________________________________   I have reviewed the triage vital signs and the nursing notes.   HISTORY  Chief Complaint Shortness of Breath   History limited by: Limited due to respiratory distress   HPI Scott [name] is a 76 y.o. male who presents to the emergency department today because of concerns for shortness of breath and chest pain. History is somewhat limited by patient's respiratory distress. He primarily is able to communicate currently with nods. Shortness breath. Apparently has been going on for a couple of days although chest pain and discomfort appears to be new. EMS states that there were couple episodes of bradycardia during transport when the patient would clutch his chest. Patient was given aspirin and nitroglycerin with EMS.    Past Medical History:  Diagnosis Date  . Arthritis    right hip, since a fall  . CAD (coronary artery disease)    a. 04/2012 Cath: 3VD->Med Rx;  b. 04/2013 PCI RCA (2 DES); c. 03/2014 PCI: LAD 80 (3.0x23 Xience [place]pine DES), 30 ISR in RCA; d. 06/2014 CABG x 2 ([facility]) LIMA->LAD, VG->OM; e. 11/2014 Neg MV;  f. 12/2014 Cath ([facility]): patent grafts->Med Rx; g. 03/2015 MV ([facility]) low risk, EF 40%; h. 05/2015 MV: low risk; i. 08/2015 Cath: patent grafts, patent RCA/LAD stents, small LCX/OM->Med Rx.  . Cardiomyopathy, ischemic    a. 04/2012 Echo: EF 40-45%;  b. 08/2013 Echo: EF 45-50%, mild glob HK, mod lat/post HK, diast dysfxn, mildly dil LA, mild MR, nl RVSP; c. 01/2015 Echo: EF 40-45%, Gr 1 DD, mildly dil LA.  . Carotid arterial disease ([place])    a. 05/2013 Carotid U/S; bilat 40-50% ICA stenosis.  . Chronic Chest Pain   . Chronic systolic CHF (congestive heart failure) ([place])    a. 01/2015 Echo: EF 40-45%.  . Cough   . Dyspnea    pt. unsure of exertion cause- that creates SOB at times   . Headache 1970's   migraine  . History of blood transfusion 2016   post op  .  Hyperlipidemia   . Hypertension   . Myocardial infarction [facility])    unsure of when    Patient Active Problem List   Diagnosis Date Noted  . 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 Coronary artery disease involving native coronary artery of native heart with angina pectoris with documented spasm ([place])   . Hx of CABG   . Ischemic cardiomyopathy   . Chronic systolic CHF (congestive heart failure) ([place])   . Chronic Chest Pain   . Coronary artery disease involving coronary bypass graft of native heart with angina pectoris with documented spasm ([place])   . Angina pectoris ([place])   . Anxiety   . Hyperlipemia   . Chronic diastolic heart failure ([place]) 12/21/2014  . Unstable angina ([place]) 12/08/2014  . PAF (paroxysmal atrial fibrillation) ([place]) 12/08/2014  . Chronic anticoagulation 12/08/2014  . Anemia 04/20/2014  . 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 Cardiomyopathy, ischemic   . HTN (hypertension)   . Carotid arterial disease ([place])   . S/P drug eluting coronary stent placement 05/30/2013  . Chest pain 05/05/2013  . SOB (shortness of breath) 05/05/2013  . CAD (coronary artery disease) 05/05/2013  . Hyperlipidemia 05/05/2013  . History of smoking 05/05/2013  . Cardiac angina ([place]) 05/05/2013    Past Surgical History:  Procedure Laterality Date  . CARDIAC CATHETERIZATION  05/01/2012  . CARDIAC CATHETERIZATION  04/2013   [facility];x3 stent  .  CARDIAC CATHETERIZATION  01/11/15    [facility]  . CARDIAC CATHETERIZATION N/A 09/16/2015   Procedure: LEFT HEART CATH AND CORS/GRAFTS ANGIOGRAPHY;  Surgeon: Minna Merritts, MD;  Location: [place] CV [facility];  Service: Cardiovascular;  Laterality: N/A;  . CARPAL TUNNEL RELEASE     right hand  . CATARACT EXTRACTION     LEFT  . CATARACT EXTRACTION W/PHACO Left 09/16/2014   Procedure: CATARACT EXTRACTION PHACO AND INTRAOCULAR LENS PLACEMENT (IOC);  Surgeon: Leandrew Koyanagi, MD;  Location: [place];  Service: Ophthalmology;   Laterality: Left;  . COLONOSCOPY    . COLONOSCOPY WITH PROPOFOL N/A 01/28/2016   Procedure: COLONOSCOPY WITH PROPOFOL;  Surgeon: Lollie Sails, MD;  Location: [facility] ENDOSCOPY;  Service: Endoscopy;  Laterality: N/A;  . CORONARY ANGIOPLASTY WITH STENT PLACEMENT  04/13/2014  . CORONARY ARTERY BYPASS GRAFT  06-26-14   x3 bypasses  . DE QUERVAIN'S RELEASE Left 08/22/2012  . ESOPHAGOGASTRODUODENOSCOPY (EGD) WITH PROPOFOL N/A 04/26/2015   Procedure: ESOPHAGOGASTRODUODENOSCOPY (EGD) WITH PROPOFOL;  Surgeon: Hulen Luster, MD;  Location[facility] ENDOSCOPY;  Service: Gastroenterology;  Laterality: N/A;  . RIB PLATING N/A 05/25/2016   Procedure: STERNAL PLATING;  Surgeon: Melrose Nakayama, MD;  Location: [place];  Service: Thoracic;  Laterality: N/A;  . right shoulder    . STERNAL WIRES REMOVAL N/A 03/30/2016   Procedure: STERNAL WIRES REMOVAL;  Surgeon: Melrose Nakayama, MD;  Location: [place];  Service: Thoracic;  Laterality: N/A;  . TONSILLECTOMY      Prior to Admission medications   Medication Sig Start Date End Date Taking? Authorizing Provider  apixaban (ELIQUIS) 5 MG TABS tablet Take 1 tablet (5 mg total) by mouth 2 (two) times daily. 05/27/16   Nani Skillern, PA-C  atorvastatin (LIPITOR) 80 MG tablet Take 80 mg by mouth at bedtime.    [provider]  carvedilol (COREG) 3.125 MG tablet Take 1 tablet (3.125 mg total) by mouth daily before breakfast. 05/15/16   Rockey Situ, [name], MD  diphenhydrAMINE (BENADRYL) 25 MG tablet Take 25 mg by mouth every 6 (six) hours as needed (for itching with tramadol).    [provider]  isosorbide mononitrate (IMDUR) 60 MG 24 hr tablet Take 60 mg by mouth daily before breakfast. Before breakfast     [provider]  nitroGLYCERIN (NITROSTAT) 0.4 MG SL tablet Place 1 tablet (0.4 mg total) under the tongue every 5 (five) minutes as needed for chest pain. 03/29/15   Minna Merritts, MD  ramipril (ALTACE) 2.5 MG capsule Take 1 capsule (2.5 mg  total) by mouth daily. Patient not taking: Reported on 07/25/2016 08/26/15   Wellington Hampshire, MD  traMADol (ULTRAM) 50 MG tablet Take 1-2 tablets (50-100 mg total) by mouth every 6 (six) hours as needed for moderate pain. 06/30/16   Melrose Nakayama, MD  zolpidem (AMBIEN) 5 MG tablet Take 5 mg by mouth at bedtime as needed for sleep.     [provider]    Allergies Contrast media [iodinated diagnostic agents]; Oxycodone hcl; Tramadol; and Vicodin [hydrocodone-acetaminophen]  Family History  Problem Relation Age of Onset  . Heart attack Father 36       MI  . Cancer Maternal Uncle     Social History Social History  Substance Use Topics  . Smoking status: Former Smoker    Packs/day: 1.00    Years: 35.00    Types: Cigarettes    Start date: 05/02/2009  . Smokeless tobacco: Never Used  Comment: stopped 10 yrs ago  . Alcohol use No    Review of Systems Constitutional: No fever/chills Cardiovascular: Positive for chest pain. Respiratory: Positive for shortness of breath. Gastrointestinal: No abdominal pain.  No nausea, no vomiting.  No diarrhea.   Genitourinary: Negative for dysuria. Musculoskeletal: Negative for back pain. Skin: Negative for rash. Neurological: Negative for headaches, focal weakness or numbness.  ____________________________________________   PHYSICAL EXAM:  VITAL SIGNS: ED Triage Vitals  Enc Vitals Group     BP 08/04/16 1413 (!) 186/78     Pulse Rate 08/04/16 1413 71     Resp 08/04/16 1413 (!) 24     Temp 08/04/16 1413 97.9 F (36.6 C)     Temp Source 08/04/16 1413 Axillary     SpO2 08/04/16 1413 100 %     Weight 08/04/16 1406 175 lb (79.4 kg)    Constitutional:Awake, alert, in moderate respiratory distress on CPAP. Eyes: Conjunctivae are normal.  ENT   Head: Normocephalic and atraumatic.   Nose: No congestion/rhinnorhea.   Mouth/Throat: Mucous membranes are moist.   Neck: No  stridor. Hematological/Lymphatic/Immunilogical: No cervical lymphadenopathy. Cardiovascular: Normal rate, regular rhythm.  No murmurs, rubs, or gallops. Respiratory: Moderate respiratory distress with somewhat diminished breath sounds diffusely. Patient is on CPAP initially. Gastrointestinal: Soft and non tender. No rebound. No guarding.  Genitourinary: Deferred Musculoskeletal: Normal range of motion in all extremities. No lower extremity edema. Neurologic:  Normal speech and language. No gross focal neurologic deficits are appreciated.  Skin:  Skin is warm, dry and intact. No rash noted. Psychiatric: Mood and affect are normal. Speech and behavior are normal. Patient exhibits appropriate insight and judgment.  ____________________________________________    LABS (pertinent positives/negatives)  Labs Reviewed  BASIC METABOLIC PANEL - Abnormal; Notable for the following:       Result Value   GFR calc non Af Amer 58 (*)    All other components within normal limits  CBC - Abnormal; Notable for the following:    Hemoglobin 12.6 (*)    HCT 38.6 (*)    MCV 78.1 (*)    MCH 25.6 (*)    RDW 16.0 (*)    All other components within normal limits  BLOOD GAS, VENOUS - Abnormal; Notable for the following:    pH, Ven 7.54 (*)    pCO2, Ven 30 (*)    Acid-Base Excess 3.7 (*)    All other components within normal limits  PROTIME-INR - Abnormal; Notable for the following:    Prothrombin Time 15.8 (*)    All other components within normal limits  HEPARIN LEVEL (UNFRACTIONATED) - Abnormal; Notable for the following:    Heparin Unfractionated 3.14 (*)    All other components within normal limits  TROPONIN I  APTT  HEPARIN LEVEL (UNFRACTIONATED)  APTT  TROPONIN I  TROPONIN I  TROPONIN I  TROPONIN I     ____________________________________________   EKG  I, Nance Pear, attending physician, personally viewed and interpreted this EKG  EKG Time: 1407 Rate: 75 Rhythm: normal  sinus rhythm Axis: left axis deviation Intervals: qtc 442 QRS: non specific IVCD  ST changes: no st elevation Impression: abnormal ekg  I, Nance Pear, attending physician, personally viewed and interpreted this EKG  EKG Time: 1436 Rate: 68 Rhythm: sinus rhythm Axis: left axis deviation Intervals: qtc 454 QRS: non specific IVCD ST changes: no st elevation Impression: abnormal ekg  No significant change from earlier   ____________________________________________    RADIOLOGY  CXR IMPRESSION:  No acute disease.    ___________________________________________   PROCEDURES  Procedures  CRITICAL CARE Performed by: Nance Pear   Total critical care time: 35 minutes  Critical care time was exclusive of separately billable procedures and treating other patients.  Critical care was necessary to treat or prevent imminent or life-threatening deterioration.  Critical care was time spent personally by me on the following activities: development of treatment plan with patient and/or surrogate as well as nursing, discussions with consultants, evaluation of patient's response to treatment, examination of patient, obtaining history from patient or surrogate, ordering and performing treatments and interventions, ordering and review of laboratory studies, ordering and review of radiographic studies, pulse oximetry and re-evaluation of patient's condition.  ____________________________________________   INITIAL IMPRESSION / ASSESSMENT AND PLAN / ED COURSE  Pertinent labs & imaging results that were available during my care of the patient were reviewed by me and considered in my medical decision making (see chart for details).  Patient presented to the emergency department today with moderate respiratory distress and on CPAP. Patient was transferred over to BiPAP which did seem to slightly help the patient's breathing. Given his significant cardiac history did have  concern for ACS. Patient was started on heparin. No STEMI on EKG. Initial troponin was negative. Patient will be admitted to hospital service for further workup and evaluation.  ____________________________________________   FINAL CLINICAL IMPRESSION(S) / ED DIAGNOSES  Final diagnoses:  Chest pain, unspecified type  Shortness of breath     Note: This dictation was prepared with Dragon dictation. Any transcriptional errors that result from this process are unintentional     Nance Pear, MD 08/04/16 1553

## 2016-08-05 ENCOUNTER — Inpatient Hospital Stay (HOSPITAL_COMMUNITY)
Admit: 2016-08-05 | Discharge: 2016-08-05 | Disposition: A | Discharge status: Home / Self Care | Payer: Medicare HMO | Attending: Physician Assistant | Admitting: Physician Assistant

## 2016-08-05 DIAGNOSIS — R0602 Shortness of breath: Principal | ICD-10-CM

## 2016-08-05 DIAGNOSIS — R69 Illness, unspecified: Secondary | ICD-10-CM | POA: Diagnosis not present

## 2016-08-05 DIAGNOSIS — I25709 Atherosclerosis of coronary artery bypass graft(s), unspecified, with unspecified angina pectoris: Secondary | ICD-10-CM

## 2016-08-05 DIAGNOSIS — F410 Panic disorder [episodic paroxysmal anxiety] without agoraphobia: Secondary | ICD-10-CM

## 2016-08-05 DIAGNOSIS — R079 Chest pain, unspecified: Secondary | ICD-10-CM | POA: Diagnosis not present

## 2016-08-05 DIAGNOSIS — I340 Nonrheumatic mitral (valve) insufficiency: Secondary | ICD-10-CM

## 2016-08-05 DIAGNOSIS — M9689 Other intraoperative and postprocedural complications and disorders of the musculoskeletal system: Secondary | ICD-10-CM | POA: Diagnosis not present

## 2016-08-05 LAB — CBC
HCT: 36.8 % — ABNORMAL LOW (ref 40.0–52.0)
HEMOGLOBIN: 12.1 g/dL — AB (ref 13.0–18.0)
MCH: 25.6 pg — ABNORMAL LOW (ref 26.0–34.0)
MCHC: 32.8 g/dL (ref 32.0–36.0)
MCV: 78 fL — ABNORMAL LOW (ref 80.0–100.0)
PLATELETS: 232 10*3/uL (ref 150–440)
RBC: 4.71 MIL/uL (ref 4.40–5.90)
RDW: 16.3 % — ABNORMAL HIGH (ref 11.5–14.5)
WBC: 5.7 10*3/uL (ref 3.8–10.6)

## 2016-08-05 LAB — BASIC METABOLIC PANEL
ANION GAP: 6 (ref 5–15)
BUN: 15 mg/dL (ref 6–20)
CALCIUM: 8.8 mg/dL — AB (ref 8.9–10.3)
CO2: 25 mmol/L (ref 22–32)
CREATININE: 1.09 mg/dL (ref 0.61–1.24)
Chloride: 108 mmol/L (ref 101–111)
GLUCOSE: 102 mg/dL — AB (ref 65–99)
Potassium: 3.9 mmol/L (ref 3.5–5.1)
Sodium: 139 mmol/L (ref 135–145)

## 2016-08-05 LAB — APTT: aPTT: 102 seconds — ABNORMAL HIGH (ref 24–36)

## 2016-08-05 LAB — TROPONIN I: Troponin I: <0.03 ng/mL (ref <0.03)

## 2016-08-05 LAB — ECHOCARDIOGRAM COMPLETE
HEIGHTINCHES: 70 in
WEIGHTICAEL: 2644.8 [oz_av]

## 2016-08-05 LAB — GLUCOSE, CAPILLARY
GLUCOSE-CAPILLARY: 129 mg/dL — AB (ref 65–99)
GLUCOSE-CAPILLARY: 116 mg/dL — AB (ref 65–99)
Glucose-Capillary: 111 mg/dL — ABNORMAL HIGH (ref 65–99)
Glucose-Capillary: 124 mg/dL — ABNORMAL HIGH (ref 65–99)

## 2016-08-05 LAB — HEPARIN LEVEL (UNFRACTIONATED): HEPARIN UNFRACTIONATED: 1.54 [IU]/mL — AB (ref 0.30–0.70)

## 2016-08-05 MED ORDER — MORPHINE SULFATE (PF) 2 MG/ML IV SOLN
2.0000 mg | INTRAVENOUS | Status: AC
  Administered 2016-08-05: 2 mg via INTRAVENOUS
  Filled 2016-08-05: qty 1

## 2016-08-05 MED ORDER — APIXABAN 5 MG PO TABS
5.0000 mg | ORAL_TABLET | Freq: Two times a day (BID) | ORAL | Status: DC
  Administered 2016-08-05 – 2016-08-08 (×7): 5 mg via ORAL
  Filled 2016-08-05 (×7): qty 1

## 2016-08-05 MED ORDER — HEPARIN (PORCINE) IN NACL 100-0.45 UNIT/ML-% IJ SOLN
850.0000 [IU]/h | INTRAMUSCULAR | Status: DC
  Administered 2016-08-05: 850 [IU]/h via INTRAVENOUS

## 2016-08-05 NOTE — Care Management CC44 (Signed)
Condition Code 44 Documentation Completed  Patient Details  Name: ASHLY GOETHE MRN: [id number] Date of Birth: [birth year]   Condition Code 44 given:  Yes Patient signature on Condition Code 44 notice:  Yes Documentation of 2 MD's agreement:  Yes Code 44 added to claim:  Yes    Ival Bible, RN 08/05/2016, 4:55 PM

## 2016-08-05 NOTE — Progress Notes (Addendum)
Heparin turned off for 30 minutes per pharmacy recommendation due to high APTT level. No bleeding/bruising/hematoma noted. Will continue to monitor.

## 2016-08-05 NOTE — Progress Notes (Signed)
[place] at [place] NAME: Scott [name]    MR#:  [id number]  DATE OF BIRTH:  [birth year]  SUBJECTIVE: Admitted for chest pain, shortness of breath, when he came here shortness of breath and hyperventilation, put on BiPAP in the emergency room. ABG, chest x-ray were within normal limits so we discontinued the BiPAP and change nasal cannula. For chest pain he had troponins 3 times which were negative. Seen by cardiology Dr. Esmond Plants in the emergency room. Had episode of shortness of breath this morning as per wife. Echocardiogram is done but results are pending. On discussion with the cardiology patient has lots of anxiety and we had to give him Xanax, Ativan .  CHIEF COMPLAINT:   Chief Complaint  Patient presents with  . Shortness of Breath  . Chest Pain    REVIEW OF SYSTEMS:   ROS CONSTITUTIONAL: No fever, fatigue or weakness.  EYES: No blurred or double vision.  EARS, NOSE, AND THROAT: No tinnitus or ear pain.  RESPIRATORY:c/of shortness of breath wheezing or hemoptysis.  CARDIOVASCULAR: No chest pain, orthopnea, edema.  GASTROINTESTINAL: No nausea, vomiting, diarrhea or abdominal pain.  GENITOURINARY: No dysuria, hematuria.  ENDOCRINE: No polyuria, nocturia,  HEMATOLOGY: No anemia, easy bruising or bleeding SKIN: No rash or lesion. MUSCULOSKELETAL: No joint pain or arthritis.   NEUROLOGIC: No tingling, numbness, weakness.  PSYCHIATRY:  anxiety or depression.   DRUG ALLERGIES:   Allergies  Allergen Reactions  . Contrast Media [Iodinated Diagnostic Agents] Hives  . Oxycodone Hcl Itching  . Tramadol Itching  . Vicodin [Hydrocodone-Acetaminophen] Itching    VITALS:  Blood pressure 130/76, pulse 75, temperature (!) 97.5 F (36.4 C), temperature source Oral, resp. rate 16, height 5\' 10"  (1.778 m), weight 75 kg (165 lb 4.8 oz), SpO2 99 %.  PHYSICAL EXAMINATION:  GENERAL:  76 y.o.-year-old patient lying in the bed with no  acute distress.  EYES: Pupils equal, round, reactive to light and accommodation. No scleral icterus. Extraocular muscles intact.  HEENT: Head atraumatic, normocephalic. Oropharynx and nasopharynx clear.  NECK:  Supple, no jugular venous distention. No thyroid enlargement, no tenderness.  LUNGS: Normal breath sounds bilaterally, no wheezing, rales,rhonchi or crepitation. No use of accessory muscles of respiration.  CARDIOVASCULAR: S1, S2 normal. No murmurs, rubs, or gallops.  ABDOMEN: Soft, nontender, nondistended. Bowel sounds present. No organomegaly or mass.  EXTREMITIES: No pedal edema, cyanosis, or clubbing.  NEUROLOGIC: Cranial nerves II through XII are intact. Muscle strength 5/5 in all extremities. Sensation intact. Gait not checked.  PSYCHIATRIC:very anxious. SKIN: No obvious rash, lesion, or ulcer.    LABORATORY PANEL:   CBC  Recent Labs Lab 08/05/16 0333  WBC 5.7  HGB 12.1*  HCT 36.8*  PLT 232   ------------------------------------------------------------------------------------------------------------------  Chemistries   Recent Labs Lab 08/05/16 0333  NA 139  K 3.9  CL 108  CO2 25  GLUCOSE 102*  BUN 15  CREATININE 1.09  CALCIUM 8.8*   ------------------------------------------------------------------------------------------------------------------  Cardiac Enzymes  Recent Labs Lab 08/05/16 0333  TROPONINI <0.03   ------------------------------------------------------------------------------------------------------------------  RADIOLOGY:  Dg Chest Portable 1 View  Result Date: 08/04/2016 CLINICAL DATA:  Shortness of breath and chest pain today. EXAM: PORTABLE CHEST 1 VIEW COMPARISON:  CT chest 05/09/2016. PA and lateral chest 07/25/2016 and 06/06/2016. FINDINGS: The lungs are clear. Heart size is normal. No pneumothorax or pleural effusion. The patient is status post CABG. No acute bony abnormality. IMPRESSION: No acute disease. Electronically Signed    By:  Inge Rise M.D.   On: 08/04/2016 14:24    EKG:   Orders placed or performed during the hospital encounter of 08/04/16  . ED EKG within 10 minutes  . ED EKG within 10 minutes  . EKG 12-Lead  . EKG 12-Lead  . ED EKG  . ED EKG  . EKG 12-Lead  . EKG 12-Lead    ASSESSMENT AND PLAN:   #1.chest pain,shortness of breath secondary to anxiety attack  Rather than acute coronary syndrome. Patient had numerous to ER, numerous visits to office. Troponins are negative for 3 times. Echocardiogram is done but results are pending. Admitted on Xanax. Psychiatric consult will be requested, discussed with patient's wife. Add SSRIs if okay with psychiatriy  #2 history of CAD: Cardiac enzymes are negative, discontinue heparin drip. #3 mild union of sternum after CABG: Recently repaired in [place] with the plates and screws. Patient has musculoskeletal  little chest pain. We can use the Percocet for pain control.   All the records are reviewed and case discussed with Care Management/Social Workerr. Management plans discussed with the patient, family and they are in agreement.  CODE STATUS: full  TOTAL TIME TAKING CARE OF THIS PATIENT: 35 minutes.   POSSIBLE D/C IN 1 DAY DEPENDING ON CLINICAL CONDITION.   Epifanio Lesches M.D on 08/05/2016 at 9:51 AM  Between 7am to 6pm - Pager - [phone]  After 6pm go to [email or web address] - password [email or web address] [facility]  Office  [phone]  CC: Primary care physician; Madelyn Brunner, MD   Note: This dictation was prepared with Dragon dictation along with smaller phrase technology. Any transcriptional errors that result from this process are unintentional.

## 2016-08-05 NOTE — Care Management Obs Status (Signed)
[place] NOTIFICATION   Patient Details  Name: DESTRY DAUBER MRN: [id number] Date of Birth: 08-Oct-1940   Medicare Observation Status Notification Given:  Yes [facility] letter)    Mardene Speak, RN 08/05/2016, 4:44 PM

## 2016-08-05 NOTE — Progress Notes (Signed)
Heparin drip resumed at 12:05 am (stpped for 30 minutes) at a rate of 8.5 mL/hour. Will continue to monitor.

## 2016-08-05 NOTE — Consult Note (Signed)
[facility] Consult   Reason for Consult:  SOB, anxiety Referring Physician:  Dr. Vianne Bulls Patient Identification: Scott [name] MRN:  [id number] Principal Diagnosis: SOB (shortness of breath) Diagnosis:   Patient Active Problem List   Diagnosis Date Noted  . 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 Panic attack [F41.0] 08/04/2016  . 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 Coronary artery disease involving native coronary artery of native heart with angina pectoris with documented spasm ([place]) [I25.111]   . Hx of CABG [Z95.1]   . Ischemic cardiomyopathy [I25.5]   . Chronic systolic CHF (congestive heart failure) ([place]) [I50.22]   . Chronic Chest Pain [R07.9]   . Coronary artery disease involving coronary bypass graft of native heart with angina pectoris with documented spasm ([place]) [I25.701]   . Angina pectoris ([place]) [I20.9]   . Anxiety [F41.9]   . Hyperlipemia [E78.5]   . Chronic diastolic heart failure ([place]) [I50.32] 12/21/2014  . Unstable angina ([place]) [I20.0] 12/08/2014  . PAF (paroxysmal atrial fibrillation) ([place]) [I48.0] 12/08/2014  . Chronic anticoagulation [Z79.01] 12/08/2014  . Anemia [D64.9] 04/20/2014  . 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 Cardiomyopathy, ischemic [I25.5]   . HTN (hypertension) [I10]   . Carotid arterial disease ([place]) [I77.9]   . S/P drug eluting coronary stent placement [Z95.5] 05/30/2013  . Chest pain with low risk for cardiac etiology [R07.9] 05/05/2013  . SOB (shortness of breath) [R06.02] 05/05/2013  . Coronary artery disease involving coronary bypass graft of native heart with angina pectoris ([place]) [I25.709] 05/05/2013  . Hyperlipidemia [E78.5] 05/05/2013  . History of smoking [Z87.891] 05/05/2013  . Cardiac angina ([place]) [I20.9] 05/05/2013    Total Time spent with patient: 1 hour  Subjective: " I cant breath, chest hurts"  HPI:  Scott [name]  is a 76 y.o. male with a known history of  Hypertension,  history of CAD CABG 2 in [facility], history of proximal atrial fibrillation on Epic; admitted yesterday  with chest pain, shortness of breath getting gradually worse since yesterday. Psych consult called for possible  psychological origin  of the symptoms.  Pt reports CP, SOB with severe anxiety and feeling like going to die on and off , worsening for last few days. Pt and family ( present bedside) reports declining symptoms since May when he had surgery of chest. Pt reports worry about declining physical health and loosing independence, endorsing helplessness, hopelessness.  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 Reports sadness, anxiety, poor sleep, hopelessness, helplessness. Denies SI/HI. Denies AVH. Denies paranoia.   Past Psychiatric History: denies , no psyc tx , no psych hospitalization. Denies suicide attempt. No h/o agression  Risk to Self: Is patient at risk for suicide?: No Risk to Others:   Prior Inpatient Therapy:   Prior Outpatient Therapy:    Past Medical History:  Past Medical History:  Diagnosis Date  . Arthritis    right hip, since a fall  . CAD (coronary artery disease)    a. 04/2012 Cath: 3VD->Med Rx;  b. 04/2013 PCI RCA (2 DES); c. 03/2014 PCI: LAD 80 (3.0x23 Xience Alpine DES), 30 ISR in RCA; d. 06/2014 CABG x 2 ([facility]) LIMA->LAD, VG->OM; e. 11/2014 Neg MV;  f. 12/2014 Cath ([facility]): patent grafts->Med Rx; g. 03/2015 MV ([facility]) low risk, EF 40%; h. 05/2015 MV: low risk; i. 08/2015 Cath: patent grafts, patent RCA/LAD stents, small LCX/OM->Med Rx.  . Cardiomyopathy, ischemic    a. 04/2012 Echo: EF 40-45%;  b. 08/2013 Echo: EF 45-50%, mild glob HK, mod lat/post HK, diast dysfxn, mildly dil LA, mild MR, nl RVSP; c. 01/2015 Echo: EF 40-45%, Gr 1 DD, mildly dil LA.  . Carotid arterial disease ([place])    a. 05/2013 Carotid U/S; bilat 40-50% ICA stenosis.  . Chronic Chest Pain   . Chronic systolic CHF (congestive heart failure) ([place])    a. 01/2015 Echo: EF  40-45%.  . Cough   . Dyspnea    pt. unsure of exertion cause- that creates SOB at times   . Headache 1970's   migraine  . History of blood transfusion 2016   post op  . Hyperlipidemia   . Hypertension   . Myocardial infarction [facility])    unsure of when    Past Surgical History:  Procedure Laterality Date  . CARDIAC CATHETERIZATION  05/01/2012  . CARDIAC CATHETERIZATION  04/2013   [facility];x3 stent  . CARDIAC CATHETERIZATION  01/11/15    [facility]  . CARDIAC CATHETERIZATION N/A 09/16/2015   Procedure: LEFT HEART CATH AND CORS/GRAFTS ANGIOGRAPHY;  Surgeon: Minna Merritts, MD;  Location: [place] CV LAB;  Service: Cardiovascular;  Laterality: N/A;  . CARPAL TUNNEL RELEASE     right hand  . CATARACT EXTRACTION     LEFT  . CATARACT EXTRACTION W/PHACO Left 09/16/2014   Procedure: CATARACT EXTRACTION PHACO AND INTRAOCULAR LENS PLACEMENT (IOC);  Surgeon: Leandrew Koyanagi, MD;  Location: [place];  Service: Ophthalmology;  Laterality: Left;  . COLONOSCOPY    . COLONOSCOPY WITH PROPOFOL N/A 01/28/2016   Procedure: COLONOSCOPY WITH PROPOFOL;  Surgeon: Lollie Sails, MD;  Location: [facility] ENDOSCOPY;  Service: Endoscopy;  Laterality: N/A;  . CORONARY ANGIOPLASTY WITH STENT PLACEMENT  04/13/2014  . CORONARY ARTERY BYPASS GRAFT  06-26-14   x3 bypasses  . DE QUERVAIN'S RELEASE Left 08/22/2012  . ESOPHAGOGASTRODUODENOSCOPY (EGD) WITH PROPOFOL N/A 04/26/2015   Procedure: ESOPHAGOGASTRODUODENOSCOPY (EGD) WITH PROPOFOL;  Surgeon: Hulen Luster, MD;  Location: [facility] ENDOSCOPY;  Service: Gastroenterology;  Laterality: N/A;  . RIB PLATING N/A 05/25/2016   Procedure: STERNAL PLATING;  Surgeon: Melrose Nakayama, MD;  Location: [place];  Service: Thoracic;  Laterality: N/A;  . right shoulder    . STERNAL WIRES REMOVAL N/A 03/30/2016   Procedure: STERNAL WIRES REMOVAL;  Surgeon: Melrose Nakayama, MD;  Location: [place];  Service: Thoracic;  Laterality: N/A;  . TONSILLECTOMY     Family History:  Family  History  Problem Relation Age of Onset  . Heart attack Father 61       MI  . Cancer Maternal Uncle    Family Psychiatric  History: denies Social History:  History  Alcohol Use No     History  Drug Use No    Social History   Social History  . Marital status: Married    Spouse name: N/A  . Number of children: N/A  . Years of education: N/A   Social History Main Topics  . Smoking status: Former Smoker    Packs/day: 1.00    Years: 35.00    Types: Cigarettes    Start date: 05/02/2009  . Smokeless tobacco: Never Used     Comment: stopped 10 yrs ago  . Alcohol use No  . Drug use: No  . Sexual activity: Yes   Other Topics Concern  . None   Social History Narrative  . None   Additional Social History:    Allergies:   Allergies  Allergen Reactions  . Contrast Media [  Iodinated Diagnostic Agents] Hives  . Oxycodone Hcl Itching  . Tramadol Itching  . Vicodin [Hydrocodone-Acetaminophen] Itching    Labs:  Results for orders placed or performed during the hospital encounter of 08/04/16 (from the past 48 hour(s))  Blood gas, venous     Status: Abnormal   Collection Time: 08/04/16  2:05 PM  Result Value Ref Range   FIO2 0.30    Delivery systems BILEVEL POSITIVE AIRWAY PRESSURE    pH, Ven 7.54 (H) 7.250 - 7.430   pCO2, Ven 30 (L) 44.0 - 60.0 mmHg   Bicarbonate 25.7 20.0 - 28.0 mmol/L   Acid-Base Excess 3.7 (H) 0.0 - 2.0 mmol/L   Patient temperature 37.0    Collection site VEIN    Sample type VENOUS   Basic metabolic panel     Status: Abnormal   Collection Time: 08/04/16  2:10 PM  Result Value Ref Range   Sodium 138 135 - 145 mmol/L   Potassium 3.8 3.5 - 5.1 mmol/L   Chloride 106 101 - 111 mmol/L   CO2 22 22 - 32 mmol/L   Glucose, Bld 86 65 - 99 mg/dL   BUN 17 6 - 20 mg/dL   Creatinine, Ser 1.19 0.61 - 1.24 mg/dL   Calcium 9.2 8.9 - 10.3 mg/dL   GFR calc non Af Amer 58 (L) >60 mL/min   GFR calc Af Amer >60 >60 mL/min    Comment: (NOTE) The eGFR has been  calculated using the CKD EPI equation. This calculation has not been validated in all clinical situations. eGFR's persistently <60 mL/min signify possible Chronic Kidney Disease.    Anion gap 10 5 - 15  CBC     Status: Abnormal   Collection Time: 08/04/16  2:10 PM  Result Value Ref Range   WBC 6.1 3.8 - 10.6 K/uL   RBC 4.94 4.40 - 5.90 MIL/uL   Hemoglobin 12.6 (L) 13.0 - 18.0 g/dL   HCT 38.6 (L) 40.0 - 52.0 %   MCV 78.1 (L) 80.0 - 100.0 fL   MCH 25.6 (L) 26.0 - 34.0 pg   MCHC 32.8 32.0 - 36.0 g/dL   RDW 16.0 (H) 11.5 - 14.5 %   Platelets 265 150 - 440 K/uL  Troponin I     Status: None   Collection Time: 08/04/16  2:10 PM  Result Value Ref Range   Troponin I <0.03 <0.03 ng/mL  APTT     Status: None   Collection Time: 08/04/16  2:10 PM  Result Value Ref Range   aPTT 33 24 - 36 seconds  Protime-INR     Status: Abnormal   Collection Time: 08/04/16  2:10 PM  Result Value Ref Range   Prothrombin Time 15.8 (H) 11.4 - 15.2 seconds   INR 1.25   Heparin level (unfractionated)     Status: Abnormal   Collection Time: 08/04/16  2:10 PM  Result Value Ref Range   Heparin Unfractionated 3.14 (H) 0.30 - 0.70 IU/mL    Comment: RESULTS CONFIRMED BY MANUAL DILUTION        IF HEPARIN RESULTS ARE BELOW EXPECTED VALUES, AND PATIENT DOSAGE HAS BEEN CONFIRMED, SUGGEST FOLLOW UP TESTING OF ANTITHROMBIN III LEVELS.   Troponin I     Status: None   Collection Time: 08/04/16  3:53 PM  Result Value Ref Range   Troponin I <0.03 <0.03 ng/mL  Troponin I     Status: None   Collection Time: 08/04/16  9:15 PM  Result Value Ref Range  Troponin I <0.03 <0.03 ng/mL  Heparin level (unfractionated)     Status: Abnormal   Collection Time: 08/04/16 11:08 PM  Result Value Ref Range   Heparin Unfractionated 1.97 (H) 0.30 - 0.70 IU/mL    Comment:        IF HEPARIN RESULTS ARE BELOW EXPECTED VALUES, AND PATIENT DOSAGE HAS BEEN CONFIRMED, SUGGEST FOLLOW UP TESTING OF ANTITHROMBIN III LEVELS.   APTT      Status: Abnormal   Collection Time: 08/04/16 11:08 PM  Result Value Ref Range   aPTT 140 (H) 24 - 36 seconds    Comment:        IF BASELINE aPTT IS ELEVATED, SUGGEST PATIENT RISK ASSESSMENT BE USED TO DETERMINE APPROPRIATE ANTICOAGULANT THERAPY.   Troponin I     Status: None   Collection Time: 08/05/16  3:33 AM  Result Value Ref Range   Troponin I <0.03 <0.03 ng/mL  Basic metabolic panel     Status: Abnormal   Collection Time: 08/05/16  3:33 AM  Result Value Ref Range   Sodium 139 135 - 145 mmol/L   Potassium 3.9 3.5 - 5.1 mmol/L   Chloride 108 101 - 111 mmol/L   CO2 25 22 - 32 mmol/L   Glucose, Bld 102 (H) 65 - 99 mg/dL   BUN 15 6 - 20 mg/dL   Creatinine, Ser 1.09 0.61 - 1.24 mg/dL   Calcium 8.8 (L) 8.9 - 10.3 mg/dL   GFR calc non Af Amer >60 >60 mL/min   GFR calc Af Amer >60 >60 mL/min    Comment: (NOTE) The eGFR has been calculated using the CKD EPI equation. This calculation has not been validated in all clinical situations. eGFR's persistently <60 mL/min signify possible Chronic Kidney Disease.    Anion gap 6 5 - 15  CBC     Status: Abnormal   Collection Time: 08/05/16  3:33 AM  Result Value Ref Range   WBC 5.7 3.8 - 10.6 K/uL   RBC 4.71 4.40 - 5.90 MIL/uL   Hemoglobin 12.1 (L) 13.0 - 18.0 g/dL   HCT 36.8 (L) 40.0 - 52.0 %   MCV 78.0 (L) 80.0 - 100.0 fL   MCH 25.6 (L) 26.0 - 34.0 pg   MCHC 32.8 32.0 - 36.0 g/dL   RDW 16.3 (H) 11.5 - 14.5 %   Platelets 232 150 - 440 K/uL  Glucose, capillary     Status: Abnormal   Collection Time: 08/05/16  7:53 AM  Result Value Ref Range   Glucose-Capillary 129 (H) 65 - 99 mg/dL  Heparin level (unfractionated)     Status: Abnormal   Collection Time: 08/05/16  8:51 AM  Result Value Ref Range   Heparin Unfractionated 1.54 (H) 0.30 - 0.70 IU/mL    Comment:        IF HEPARIN RESULTS ARE BELOW EXPECTED VALUES, AND PATIENT DOSAGE HAS BEEN CONFIRMED, SUGGEST FOLLOW UP TESTING OF ANTITHROMBIN III LEVELS.   APTT     Status:  Abnormal   Collection Time: 08/05/16  8:51 AM  Result Value Ref Range   aPTT 102 (H) 24 - 36 seconds    Comment:        IF BASELINE aPTT IS ELEVATED, SUGGEST PATIENT RISK ASSESSMENT BE USED TO DETERMINE APPROPRIATE ANTICOAGULANT THERAPY.   Glucose, capillary     Status: Abnormal   Collection Time: 08/05/16 12:10 PM  Result Value Ref Range   Glucose-Capillary 116 (H) 65 - 99 mg/dL    Current Facility-Administered Medications  Medication Dose Route Frequency Provider Last Rate Last Dose  . ALPRAZolam Duanne Moron) tablet 0.5 mg  0.5 mg Oral BID Epifanio Lesches, MD   0.5 mg at 08/05/16 0953  . apixaban (ELIQUIS) tablet 5 mg  5 mg Oral BID Epifanio Lesches, MD   5 mg at 08/05/16 0953  . atorvastatin (LIPITOR) tablet 80 mg  80 mg Oral QHS Epifanio Lesches, MD   80 mg at 08/04/16 2101  . carvedilol (COREG) tablet 3.125 mg  3.125 mg Oral QAC breakfast Epifanio Lesches, MD   3.125 mg at 08/05/16 0953  . ibuprofen (ADVIL,MOTRIN) tablet 400 mg  400 mg Oral Q6H PRN Epifanio Lesches, MD   400 mg at 08/04/16 2041  . LORazepam (ATIVAN) injection 2 mg  2 mg Intravenous Q4H PRN Epifanio Lesches, MD   2 mg at 08/05/16 1231  . morphine 2 MG/ML injection 1 mg  1 mg Intravenous Q4H PRN Lance Coon, MD      . nitroGLYCERIN (NITROGLYN) 2 % ointment 0.5 inch  0.5 inch Topical Q6H Epifanio Lesches, MD   0.5 inch at 08/05/16 1149  . nitroGLYCERIN (NITROSTAT) SL tablet 0.4 mg  0.4 mg Sublingual Q5 min PRN Epifanio Lesches, MD   0.4 mg at 08/04/16 2102  . ondansetron (ZOFRAN) tablet 4 mg  4 mg Oral Q6H PRN Epifanio Lesches, MD       Or  . ondansetron (ZOFRAN) injection 4 mg  4 mg Intravenous Q6H PRN Epifanio Lesches, MD      . pneumococcal 23 valent vaccine (PNU-IMMUNE) injection 0.5 mL  0.5 mL Intramuscular Tomorrow-1000 Epifanio Lesches, MD      . ramipril (ALTACE) capsule 2.5 mg  2.5 mg Oral Daily Epifanio Lesches, MD   2.5 mg at 08/05/16 1610     Musculoskeletal: Strength & Muscle Tone: within normal limits Gait & Station: normal Patient leans: N/A  Psychiatric Specialty Exam: Physical Exam  Nursing note and vitals reviewed.   ROS  Blood pressure 127/69, pulse 62, temperature 97.6 F (36.4 C), temperature source Oral, resp. rate 18, height _0  (1.778 m), weight 75 kg (165 lb 4.8 oz), SpO2 100 %.Body mass index is 23.72 kg/m.  General Appearance: lying in bed with o2, anxious  Eye Contact:  Fair  Speech:  Slow  Volume:  Normal  Mood:  Anxious  Affect:  Congruent  Thought Process:  Coherent  Orientation:  Negative  Thought Content:  Helplessness, hopelessness  Suicidal Thoughts:  No  Homicidal Thoughts:  No  Memory:  Negative  Judgement:  Fair  Insight:  Lacking  Psychomotor Activity:  Decreased  Concentration:  Poor   Recall:  intact  Fund of Knowledge:  Good  Language:  Good  Akathisia:  No  Handed:    AIMS (if indicated):     Assets:  Communication Skills Housing  ADL's:  Intact  Cognition:  WNL  Sleep:        Treatment Plan Summary:  Pt with no significant psych hx presenting with anxiety, depression in context of declining physical health.  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  Recommend- 1. Increase standing xanax as 0.5 mg po tid, with prn xanax  if needed  2. start zoloft 20m po daily for anxiety/depression, may increase dose to 539mif tolerates.    Disposition: Patient does not meet criteria for psychiatric inpatient admission., will need psych f/u plan upon d/c. Call with questions.    JaLenward ChancellorMD 08/05/2016 3:20 PM

## 2016-08-05 NOTE — Progress Notes (Signed)
*  PRELIMINARY RESULTS* Echocardiogram 2D Echocardiogram has been performed.  Lavell Luster Basir Niven 08/05/2016, 8:26 AM

## 2016-08-05 NOTE — Progress Notes (Signed)
Progress Note  Patient Name: Scott [name] Date of Encounter: 08/05/2016  Admit date: 08/04/2016                                   Date of Consult: 08/04/2016 Primary Physician: Madelyn Brunner, MD Primary Cardiologist: Dr. Rockey Situ, MD Requesting Physician: Dr. Vianne Bulls, MD  Patient Profile     76 y.o. Male (Well known to Dr. Rockey Situ) with history of coronary artery disease, CABG x 2 at [facility] 2016 (with follow-up stress test negative for ischemia followed by cardiac catheterization showing no significant change), chronic pain syndrome, redo sternal debridement with plating May 2018 in [place], who presents with stuttering shortness of breath and chest discomfort. The patient has had multiple hospitalizations and ER visits chronic chest pain issues. There is also concern for significant anxiety based on his chronic pain. He apparently had been doing gardening, raking and picking up limbs with no issues of chest pain and dyspnea, but afterwards noted dyspnea and chest tightness. He presented to the ER with dyspnea and chest tightness initially treated with BiPAP, Nitropaste and heparin. -> Quickly weaned to use nasal cannula, heparin discontinued due to negative cardiac enzymes.  Of note, dyspnea and hyperventilation improved with Ativan in the ER. Initial consultation by Dr. Rockey Situ suggested that this was most likely related to anxiety and not true angina.  Subjective   Still having paroxysmal of chest discomfort with gasping for air and difficulty breathing. Very concerned and very anxious. Was tachypneic & hyperventilating during Echo this AM.  He had at least 3 paroxysmal short-lived gasping for breath with grimacing and contorting in pain.  All during this time his oxygen saturations were in the mid to high 90s. Heart rate did not change.  Inpatient Medications    Scheduled Meds: . ALPRAZolam  0.5 mg Oral BID  . apixaban  5 mg Oral BID  . atorvastatin  80 mg Oral QHS  .  carvedilol  3.125 mg Oral QAC breakfast  . nitroGLYCERIN  0.5 inch Topical Q6H  . pneumococcal 23 valent vaccine  0.5 mL Intramuscular Tomorrow-1000  . ramipril  2.5 mg Oral Daily   Continuous Infusions:  PRN Meds: ibuprofen, LORazepam, morphine injection, nitroGLYCERIN, ondansetron **OR** ondansetron (ZOFRAN) IV   Vital Signs    Vitals:   08/04/16 2107 08/04/16 2327 08/05/16 0513 08/05/16 0951  BP: 116/64 130/68 130/76 122/73  Pulse:  64 75 (!) 59  Resp:  16 16 18   Temp:   (!) 97.5 F (36.4 C) (!) 97.5 F (36.4 C)  TempSrc:   Oral Oral  SpO2:  100% 99% 100%  Weight:   165 lb 4.8 oz (75 kg)   Height:        Intake/Output Summary (Last 24 hours) at 08/05/16 1123 Last data filed at 08/05/16 0934  Gross per 24 hour  Intake              240 ml  Output              400 ml  Net             -160 ml   Filed Weights   08/04/16 1406 08/04/16 1752 08/05/16 0513  Weight: 175 lb (79.4 kg) 175 lb 0.7 oz (79.4 kg) 165 lb 4.8 oz (75 kg)    Telemetry    Not on Tele  ECG    No new EKG -  Personally Reviewed  Physical Exam   GEN: Hyper anxious gentleman who appears initially be in no acute distress, but then has what appear to be brief anxiety spells.  Neck: No JVD or carotid bruit Cardiac:  RRR, normal S1 and S2. No M/R/G. Normal pulses. Respiratory: Clear to auscultation bilaterally. At baseline normal x-ray effort, however with paroxysms he is tachypneic and gasping for air. GI: Soft, nontender, non-distended  MS: No edema; No deformity. Neuro:  Nonfocal  Psych: Normal affect   Labs    Chemistry  Recent Labs Lab 08/04/16 1410 08/05/16 0333  NA 138 139  K 3.8 3.9  CL 106 108  CO2 22 25  GLUCOSE 86 102*  BUN 17 15  CREATININE 1.19 1.09  CALCIUM 9.2 8.8*  GFRNONAA 58* >60  GFRAA >60 >60  ANIONGAP 10 6     Hematology  Recent Labs Lab 08/04/16 1410 08/05/16 0333  WBC 6.1 5.7  RBC 4.94 4.71  HGB 12.6* 12.1*  HCT 38.6* 36.8*  MCV 78.1* 78.0*  MCH 25.6*  25.6*  MCHC 32.8 32.8  RDW 16.0* 16.3*  PLT 265 232    Cardiac Enzymes  Recent Labs Lab 08/04/16 1410 08/04/16 1553 08/04/16 2115 08/05/16 0333  TROPONINI <0.03 <0.03 <0.03 <0.03   No results for input(s): TROPIPOC in the last 168 hours.   BNPNo results for input(s): BNP, PROBNP in the last 168 hours.   DDimer No results for input(s): DDIMER in the last 168 hours.   Radiology    Dg Chest Portable 1 View  Result Date: 08/04/2016 CLINICAL DATA:  Shortness of breath and chest pain today. EXAM: PORTABLE CHEST 1 VIEW COMPARISON:  CT chest 05/09/2016. PA and lateral chest 07/25/2016 and 06/06/2016. FINDINGS: The lungs are clear. Heart size is normal. No pneumothorax or pleural effusion. The patient is status post CABG. No acute bony abnormality. IMPRESSION: No acute disease. Electronically Signed   By: Inge Rise M.D.   On: 08/04/2016 14:24    Cardiac Studies    Echocardiogram 08/05/2016: Patient was reportedly very anxious during study. Normal LV size and function. EF 60-65% with no regional wall motion abnormalities. Mild MR. Normal RV function. Normal RV pressures. Normal echo.  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 Rockey Situ. Dr. Rockey Situ felt like these episodes are more consistent with anxiety attacks and not true angina. The recommendation was to check 2-D echocardiogram and today and if abnormal consider further evaluation. However the echo is normal. No regional wall motion abnormalities to suggest ongoing ischemia. Normal diastolic function. This would argue against heart failure either  HFpEF or HFLF With no EKG changes, negative troponins, and normal echocardiogram -no further cardiac evaluation is necessary.  My best guess why his symptoms are occurring is because he is having paroxysms of potentially muscle spasm or scar related discomfort from his redo sternotomy for non-union. He may have overexerted himself yesterday.  I suspect that this is not cardiac in nature, more musculoskeletal.  Discomfort is worse with deep inspiration makes him feel like he is suffocating and therefore he becomes anxious.  We will arrange  follow-up with Dr. Rockey Situ in clinic to discuss further evaluation.  Please refer to Dr. Donivan Scull initial consult note for concerns of anxiety with panic attack.  Cardiac standpoint, he is on ELIQUIS for A. fib anticoagulation, therefore not on aspirin or Plavix. He is on stable dose of carvedilol and ACE inhibitor. He is also on high-dose statin.  We'll sign off for now unless her active issues    Signed, Glenetta Hew, MD  08/05/2016, 11:23 AM

## 2016-08-05 NOTE — Progress Notes (Signed)
Patient had 2.03 seconds pause around 11 pm. Patient was asymptomatic when the RN checked on him. Dr. Jannifer Franklin notified without any new order.  Will continue to monitor.

## 2016-08-05 NOTE — Progress Notes (Addendum)
ANTICOAGULATION CONSULT NOTE - Initial Consult  Pharmacy Consult for heparin drip dosing and monitoring  Indication: chest pain/ACS  Allergies  Allergen Reactions  . Contrast Media [Iodinated Diagnostic Agents] Hives  . Oxycodone Hcl Itching  . Tramadol Itching  . Vicodin [Hydrocodone-Acetaminophen] Itching    Patient Measurements: Height: 5\' 10"  (177.8 cm) Weight: 175 lb 0.7 oz (79.4 kg) IBW/kg (Calculated) : 73 Vital Signs: Temp: 97.6 F (36.4 C) (07/13 2001) Temp Source: Oral (07/13 2001) BP: 130/68 (07/13 2327) Pulse Rate: 64 (07/13 2327)  Labs:  Recent Labs  08/04/16 1410 08/04/16 1553 08/04/16 2115 08/04/16 2308  HGB 12.6*  --   --   --   HCT 38.6*  --   --   --   PLT 265  --   --   --   APTT 33  --   --  140*  LABPROT 15.8*  --   --   --   INR 1.25  --   --   --   HEPARINUNFRC 3.14*  --   --  1.97*  CREATININE 1.19  --   --   --   TROPONINI <0.03 <0.03 <0.03  --     Estimated Creatinine Clearance: 56.2 mL/min (by C-G formula based on SCr of 1.19 mg/dL).   Medical History: Past Medical History:  Diagnosis Date  . Arthritis    right hip, since a fall  . CAD (coronary artery disease)    a. 04/2012 Cath: 3VD->Med Rx;  b. 04/2013 PCI RCA (2 DES); c. 03/2014 PCI: LAD 80 (3.0x23 Xience Alpine DES), 30 ISR in RCA; d. 06/2014 CABG x 2 ([facility]) LIMA->LAD, VG->OM; e. 11/2014 Neg MV;  f. 12/2014 Cath ([facility]): patent grafts->Med Rx; g. 03/2015 MV ([facility]) low risk, EF 40%; h. 05/2015 MV: low risk; i. 08/2015 Cath: patent grafts, patent RCA/LAD stents, small LCX/OM->Med Rx.  . Cardiomyopathy, ischemic    a. 04/2012 Echo: EF 40-45%;  b. 08/2013 Echo: EF 45-50%, mild glob HK, mod lat/post HK, diast dysfxn, mildly dil LA, mild MR, nl RVSP; c. 01/2015 Echo: EF 40-45%, Gr 1 DD, mildly dil LA.  . Carotid arterial disease ([place])    a. 05/2013 Carotid U/S; bilat 40-50% ICA stenosis.  . Chronic Chest Pain   . Chronic systolic CHF (congestive heart failure) ([place])    a. 01/2015 Echo: EF  40-45%.  . Cough   . Dyspnea    pt. unsure of exertion cause- that creates SOB at times   . Headache 1970's   migraine  . History of blood transfusion 2016   post op  . Hyperlipidemia   . Hypertension   . Myocardial infarction [facility])    unsure of when   Assessment: 76 yo male with PMH of CAD and CABG. Pharmacy consulted for heparin dosing and monitoring for Chest pain/ACS. According to patient's record, they were taking apixaban PTA. Patient reports last dose at 0800 this morning 7/13.   Goal of Therapy:  Heparin level 0.3-0.7 units/ml aPTT 66-102s seconds Monitor platelets by anticoagulation protocol: Yes   Plan:  Baseline labs ordered Dr. Archie Balboa wants to continue with full bolus.  heparin 4000 units bolus x 1 ordered.  Start heparin infusion at 950 units/hr Will need to adjust infusion based on aPTT levels un apixaban effect has diminished and aPTT levels and Anti-Xa levels correlate.  Check aPTT and anti-Xa level in 8 hours and daily while on heparin Continue to monitor H&H and platelets  7/13 23:00 aPTT 140, heparin level  1.97. Hold drip x 30 minutes and restart at 850 units/hr. Recheck 8 hours after restart.  Eloise Harman, PharmD, BCPS Clinical Pharmacist 08/05/2016 12:37 AM

## 2016-08-05 NOTE — Care Management CC44 (Signed)
Condition Code 44 Documentation Completed  Patient Details  Name: Scott [name] MRN: [id number] Date of Birth: Jan 09, 1941   Condition Code 44 given:  Yes Patient signature on Condition Code 44 notice:  Yes Documentation of 2 MD's agreement:  Yes Code 44 added to claim:  Yes    Jaylyne Breese A, RN 08/05/2016, 4:45 PM

## 2016-08-06 DIAGNOSIS — R079 Chest pain, unspecified: Secondary | ICD-10-CM | POA: Diagnosis not present

## 2016-08-06 DIAGNOSIS — R69 Illness, unspecified: Secondary | ICD-10-CM | POA: Diagnosis not present

## 2016-08-06 DIAGNOSIS — R0602 Shortness of breath: Secondary | ICD-10-CM | POA: Diagnosis not present

## 2016-08-06 LAB — GLUCOSE, CAPILLARY
GLUCOSE-CAPILLARY: 91 mg/dL (ref 65–99)
Glucose-Capillary: 106 mg/dL — ABNORMAL HIGH (ref 65–99)
Glucose-Capillary: 87 mg/dL (ref 65–99)
Glucose-Capillary: 98 mg/dL (ref 65–99)

## 2016-08-06 MED ORDER — SERTRALINE HCL 25 MG PO TABS: 25.0000 mg | ORAL_TABLET | Freq: Every day | ORAL | 1 refills | Status: DC

## 2016-08-06 MED ORDER — ALPRAZOLAM 0.5 MG PO TABS: 0.5000 mg | ORAL_TABLET | Freq: Three times a day (TID) | ORAL | 0 refills | Status: DC | PRN

## 2016-08-06 NOTE — Telephone Encounter (Signed)
I saw him in the ER, looked like he was having a panic attack. Did better with ativan. Needs SSRIs and xanax PRN. Hope hospitalist will get PMD involved. He ruled out, echo normal thx TGollan

## 2016-08-06 NOTE — Discharge Summary (Signed)
Scott [name], is a 76 y.o. male  DOB July 24, 1940  MRN [id number].  Admission date:  08/04/2016  Admitting Physician  Epifanio Lesches, MD  Discharge Date:  08/06/2016   Primary MD  Madelyn Brunner, MD  Recommendations for primary care physician for things to follow:   Follow-up with PCP in one week Follow-up with Dr. Esmond Plants in 2 weeks.   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 Arthritis    right hip, since a fall  . CAD (coronary artery disease)    a. 04/2012 Cath: 3VD->Med Rx;  b. 04/2013 PCI RCA (2 DES); c. 03/2014 PCI: LAD 80 (3.0x23 Xience Alpine DES), 30 ISR in RCA; d. 06/2014 CABG x 2 ([facility]) LIMA->LAD, VG->OM; e. 11/2014 Neg MV;  f. 12/2014 Cath ([facility]): patent grafts->Med Rx; g. 03/2015 MV ([facility]) low risk, EF 40%; h. 05/2015 MV: low risk; i. 08/2015 Cath: patent grafts, patent RCA/LAD stents, small LCX/OM->Med Rx.  . Cardiomyopathy, ischemic    a. 04/2012 Echo: EF 40-45%;  b. 08/2013 Echo: EF 45-50%, mild glob HK, mod lat/post HK, diast dysfxn, mildly dil LA, mild MR, nl RVSP; c. 01/2015 Echo: EF 40-45%, Gr 1 DD, mildly dil LA.  . Carotid arterial disease ([place])    a. 05/2013 Carotid U/S; bilat 40-50% ICA stenosis.  . Chronic Chest  Pain   . Chronic systolic CHF (congestive heart failure) ([place])    a. 01/2015 Echo: EF 40-45%.  . Cough   . Dyspnea    pt. unsure of exertion cause- that creates SOB at times   . Headache 1970's   migraine  . History of blood transfusion 2016   post op  . Hyperlipidemia   . Hypertension   . Myocardial infarction [facility])    unsure of when    Past Surgical History:  Procedure Laterality Date  . CARDIAC CATHETERIZATION  05/01/2012  . CARDIAC CATHETERIZATION  04/2013   [facility];x3 stent  . CARDIAC CATHETERIZATION  01/11/15    [facility]  . CARDIAC CATHETERIZATION N/A 09/16/2015   Procedure: LEFT HEART CATH AND CORS/GRAFTS ANGIOGRAPHY;  Surgeon: Minna Merritts, MD;  Location: [place] [facility];  Service: Cardiovascular;  Laterality: N/A;  . CARPAL TUNNEL RELEASE     right hand  . CATARACT EXTRACTION     LEFT  . CATARACT EXTRACTION W/PHACO Left 09/16/2014   Procedure: CATARACT EXTRACTION PHACO AND INTRAOCULAR LENS PLACEMENT (IOC);  Surgeon: Leandrew Koyanagi, MD;  Location: [place];  Service: Ophthalmology;  Laterality: Left;  . COLONOSCOPY    . COLONOSCOPY WITH PROPOFOL N/A 01/28/2016   Procedure: COLONOSCOPY WITH PROPOFOL;  Surgeon: Lollie Sails, MD;  Location: [facility] ENDOSCOPY;  Service: Endoscopy;  Laterality: N/A;  . CORONARY ANGIOPLASTY WITH STENT PLACEMENT  04/13/2014  . CORONARY ARTERY BYPASS GRAFT  06-26-14   x3 bypasses  . DE QUERVAIN'S RELEASE Left 08/22/2012  .  ESOPHAGOGASTRODUODENOSCOPY (EGD) WITH PROPOFOL N/A 04/26/2015   Procedure: ESOPHAGOGASTRODUODENOSCOPY (EGD) WITH PROPOFOL;  Surgeon: Hulen Luster, MD;  Location: [facility] ENDOSCOPY;  Service: Gastroenterology;  Laterality: N/A;  . RIB PLATING N/A 05/25/2016   Procedure: STERNAL PLATING;  Surgeon: Melrose Nakayama, MD;  Location: [place];  Service: Thoracic;  Laterality: N/A;  . right shoulder    . STERNAL WIRES REMOVAL N/A 03/30/2016   Procedure: STERNAL WIRES REMOVAL;  Surgeon: Melrose Nakayama, MD;  Location: [place];   Service: Thoracic;  Laterality: N/A;  . TONSILLECTOMY         History of present illness and  Hospital Course:     Kindly see H&P for history of present illness and admission details, please review complete Labs, Consult reports and Test reports for all details in brief  HPI  from the history and physical done on the day of admission  Scott [name]  is a 76 y.o. male with a known history of Hypertension, severe anxiety, history of CAD CABG 2 in [facility], history of proximal atrial fibrillation on Epic; comes in with chest pain, shortness of breath getting gradually worse . Has midsternal chest pain and shortness of breath, brought in by EMS. In the emergency room patient had severe chest pain and shortness of breath started on BiPAP.  First set of troponins are negativ weaned off the BiPAP started nasal cannula, admitted to telemetry for chest pain evaluation Essentially this patient has severe anxiety and keeps having evaluations for chest and multiple times with negative workup. 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   Hospital Course  Shortness of breath secondary to panic attacks: Seen by cardiology and also psychiatrically. Patient shortness of breath improved with an Ativan, Xanax. Seen by psychiatrically they recommended Xanax 0.5 mg 3 times daily, started on Zoloft 25 MG daily. Patient had echocardiogram to evaluate for heart function, EF 60% with normal wall motion, cardiology recommended no further workup and the patient can see Darlin Drop as an outpatient. #2 history of CAD, CABG 2 times at [facility], recent sternal rewiring. The patient has musculoskeletal chest pain. Can use ibuprofen for pain as needed.  #3. hyperventilation in the emergency room and also in hospital stay improved with Ativan. Chest pain due to anxiety but not angina.  #4. proximal atrial fibrillation: Patient is on the [place]. D/w wife ,pt  Discharge Condition: stable   Follow  UP  Follow-up Information    Lottie Mussel III, MD Follow up in 1 week(s).   Specialty:  Internal Medicine Contact information: [place] [address] [phone]        Minna Merritts, MD Follow up in 2 week(s).   Specialty:  Cardiology Contact information: [place] [id number] [phone]             Discharge Instructions  and  Discharge Medications    Allergies as of 08/06/2016      Reactions   Contrast Media [iodinated Diagnostic Agents] Hives   Oxycodone Hcl Itching   Tramadol Itching   Vicodin [hydrocodone-acetaminophen] Itching      Medication List    TAKE these medications   ALPRAZolam 0.5 MG tablet Commonly known as:  XANAX Take 1 tablet (0.5 mg total) by mouth 3 (three) times daily as needed for sleep or anxiety.   apixaban 5 MG Tabs tablet Commonly known as:  ELIQUIS Take 1 tablet (5 mg  total) by mouth 2 (two) times daily.   atorvastatin 80 MG tablet Commonly known as:  LIPITOR Take 80 mg by mouth at bedtime.   carvedilol 3.125 MG tablet Commonly known as:  COREG Take 1 tablet (3.125 mg total) by mouth daily before breakfast.   isosorbide mononitrate 60 MG 24 hr tablet Commonly known as:  IMDUR Take 60 mg by mouth daily before breakfast. Before breakfast   nitroGLYCERIN 0.4 MG SL tablet Commonly known as:  NITROSTAT Place 1 tablet (0.4 mg total) under the tongue every 5 (five) minutes as needed for chest pain.   ramipril 2.5 MG capsule Commonly known as:  ALTACE Take 1 capsule (2.5 mg total) by mouth daily.   sertraline 25 MG tablet Commonly known as:  ZOLOFT Take 1 tablet (25 mg total) by mouth daily.   traMADol 50 MG tablet Commonly known as:  ULTRAM Take 1-2 tablets (50-100 mg total) by mouth every 6 (six) hours as needed for moderate pain.   zolpidem 5 MG tablet Commonly known as:  AMBIEN Take 5 mg by mouth at bedtime as needed for sleep.          Diet and Activity recommendation: See Discharge Instructions above   Consults obtained -cardiology,psychiatry   Major procedures and Radiology Reports - PLEASE review detailed and final reports for all details, in brief -      Dg Chest 2 View  Result Date: 07/25/2016 CLINICAL DATA:  Nonunion of stir numb. Rib plating. CABG 2016. Chest pain today. EXAM: CHEST  2 VIEW COMPARISON:  06/06/2016 FINDINGS: Previous median sternotomy. Extensive sternal plate procedure. Heart size is normal. Coronary artery stents noted. Pulmonary vascularity is normal. Lungs are clear. No effusions. No change in the sternal hardware. IMPRESSION: No active cardiopulmonary disease. Electronically Signed   By: Nelson Chimes M.D.   On: 07/25/2016 10:35   Dg Chest Portable 1 View  Result Date: 08/04/2016 CLINICAL DATA:  Shortness of breath and chest pain today. EXAM: PORTABLE CHEST 1 VIEW COMPARISON:  CT chest 05/09/2016. PA and lateral chest 07/25/2016 and 06/06/2016. FINDINGS: The lungs are clear. Heart size is normal. No pneumothorax or pleural effusion. The patient is status post CABG. No acute bony abnormality. IMPRESSION: No acute disease. Electronically Signed   By: Inge Rise M.D.   On: 08/04/2016 14:24    Micro Results    No results found for this or any previous visit (from the past 240 hour(s)).     Today   Subjective:   Graves Nipp today has Chest pain, no shortness of breath. Stable for discharge. He keeps asking  About  having shortness of breath episodes with out finding answer to his problem, told him that his workup has been negative and his shortness of breath is due to anxiety rather than acute coronary syndrome or pneumonia.  Objective:   Blood pressure 118/72, pulse 71, temperature 97.7 F (36.5 C), temperature source Oral, resp. rate 18, height 5\' 10"  (1.778 m), weight 74.5 kg (164 lb 4.8 oz), SpO2 100 %.   Intake/Output Summary (Last 24 hours) at 08/06/16 0756 Last  data filed at 08/06/16 0434  Gross per 24 hour  Intake              480 ml  Output              600 ml  Net             -120 ml    Exam Awake Alert, Oriented x  3, No new F.N deficits, Normal affect [place].AT,PERRAL Supple Neck,No JVD, No cervical lymphadenopathy appriciated.  Symmetrical Chest wall movement, Good air movement bilaterally, CTAB RRR,No Gallops,Rubs or new Murmurs, No Parasternal Heave +ve B.Sounds, Abd Soft, Non tender, No organomegaly appriciated, No rebound -guarding or rigidity. No Cyanosis, Clubbing or edema, No new Rash or bruise  Data Review   CBC w Diff: Lab Results  Component Value Date   WBC 5.7 08/05/2016   HGB 12.1 (L) 08/05/2016   HGB 14.6 09/13/2015   HCT 36.8 (L) 08/05/2016   HCT 44.0 09/13/2015   PLT 232 08/05/2016   PLT 203 09/13/2015   LYMPHOPCT 32 02/25/2016   LYMPHOPCT 41.0 03/02/2014   MONOPCT 8 02/25/2016   MONOPCT 10.2 03/02/2014   EOSPCT 1 02/25/2016   EOSPCT 3.2 03/02/2014   BASOPCT 1 02/25/2016   BASOPCT 0.9 03/02/2014    CMP: Lab Results  Component Value Date   NA 139 08/05/2016   NA 141 09/13/2015   NA 141 03/02/2014   K 3.9 08/05/2016   K 4.0 03/02/2014   CL 108 08/05/2016   CL 106 03/02/2014   CO2 25 08/05/2016   CO2 27 03/02/2014   BUN 15 08/05/2016   BUN 16 09/13/2015   BUN 18 03/02/2014   CREATININE 1.09 08/05/2016   CREATININE 1.29 03/02/2014   PROT 6.6 06/12/2016   PROT 7.5 03/01/2014   ALBUMIN 4.0 06/12/2016   ALBUMIN 3.5 03/01/2014   BILITOT 0.2 06/12/2016   BILITOT 0.4 03/01/2014   ALKPHOS 71 06/12/2016   ALKPHOS 49 03/01/2014   AST 18 06/12/2016   AST 30 03/01/2014   ALT 13 06/12/2016   ALT 33 03/01/2014  .   Total Time in preparing paper work, data evaluation and todays exam - 58 minutes  Gareth Fitzner M.D on 08/06/2016 at 7:56 AM    Note: This dictation was prepared with Dragon dictation along with smaller phrase technology. Any transcriptional errors that result from this process are  unintentional.

## 2016-08-06 NOTE — Progress Notes (Signed)
Patient was having severe anxiety around 8 pm thinking he was going to die. Patient asked the RN if he should call his other 2 children to be at his bedside thinking something was going to happen to him tonight. The RN told the patient there was no indication to call his kids to come to the hospital but would not prevent him from doing that. Patient was complaining of SOB while 02 sat was 100% on R/A. 02 at 2L/[place] applied for comfort. Ativan 2 mg IV as needed administered for anxiety. Will continue to monitor.

## 2016-08-06 NOTE — Evaluation (Signed)
Physical Therapy Evaluation Patient Details Name: Scott [name] MRN: [id number] DOB: [birth year] Today's Date: 08/06/2016   History of Present Illness  presented to ER secondary to worsening SOB, mid-sternal chest pain, attributed to anxiety attack at this point (additional medical work up negative for causative etiology).  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  Clinical Impression  Upon evaluation, patient alert and oriented; follows commands and demonstrates good insight/safety awareness.  Bilat UE/LE strength and ROM grossly symmetrical and WFL (patient reports all sternal precautions lifted; cautious with ROM and resistance nonetheless) without focal weakness appreciated.  Currently requiring min assist for bed mobility and unsupported sitting balance; min/mod assist for sit/stand, basic transfers and gait (200' without assist device, min/mod assist; 100' with RW, min assist +2 for safety).  Impaired sitting and standing balance-patient with multiple instances of sudden LOB (multi-directional) during all upright activities; does not appear to be linked to any activity or movement.  Orthostatics negative.  Patient does demonstrate some degree of desaturation with exertion (may contribute to LOB?); difficult to formally measure due to cold fingers, but does desat <88% on RA with activity requiring 2L to recover and maintain >90% rest of session.  RN informed/aware. Patient very unsteady and unsafe with all mobility efforts; very high fall risk at this time.  Recommend RW and +1 hands-on assist for all functional activities.  Does not demonstrate ability to safety negotiate home environment at this time. Patient does appear to be very fearful/anxious, requiring constant encouragement, cuing for pursed lip breathing and relaxation both at rest and with activity.  However, O2  desat does often accompany reports of chest pain/SOB/anxiety.  Question value of testing to rule out PE?; discussed with attending physician-will consider. Would benefit from skilled PT to address above deficits and promote optimal return to PLOF; recommend transition to STR upon discharge from acute hospitalization.     Follow Up Recommendations SNF    Equipment Recommendations  Rolling walker with 5" wheels    Recommendations for Other Services       Precautions / Restrictions Precautions Precautions: Fall Restrictions Weight Bearing Restrictions: No      Mobility  Bed Mobility Overal bed mobility: Needs Assistance Bed Mobility: Supine to Sit;Sit to Supine     Supine to sit: Min guard Sit to supine: Min guard      Transfers Overall transfer level: Needs assistance Equipment used: Rolling walker (2 wheeled) Transfers: Sit to/from Stand Sit to Stand: Min assist            Ambulation/Gait Ambulation/Gait assistance: Min assist;Mod assist Ambulation Distance (Feet): 200 Feet Assistive device: None       General Gait Details: narrowed BOS with staggered gait performance; multiple lateral LOB (L > R) requiring min/mod assist from therapist for correction.  Difficulty with formal sat measurement with exertion; however, consistently below 88% on RA with exertion, requiring 2L for recovery to >90%  Stairs            Wheelchair Mobility    Modified Rankin (Stroke Patients Only)       Balance Overall balance assessment: Needs assistance Sitting-balance support: No upper extremity supported;Feet supported Sitting balance-Leahy Scale: Fair Sitting balance - Comments: sudden, intermediate LOB with unsupported sitting, requiring min assist for correction   Standing balance support: Bilateral upper extremity supported Standing balance-Leahy Scale: Poor  Pertinent Vitals/Pain Pain Assessment:  (intermittent L chest  pain, unrated; relieved with cuing for pursed lip breathing/relaxation)    Home Living Family/patient expects to be discharged to:: Private residence Living Arrangements: Spouse/significant other Available Help at Discharge: Family Type of Home: House Home Access: Level entry     Home Layout: One [place]: Environmental consultant - 2 wheels      Prior Function Level of Independence: Independent         Comments: Indep for ADLs, household and community mobility; active, working on farm; + driving; denies fall history.     Hand Dominance        Extremity/Trunk Assessment   Upper Extremity Assessment Upper Extremity Assessment: Overall WFL for tasks assessed    Lower Extremity Assessment Lower Extremity Assessment: Overall WFL for tasks assessed       Communication   Communication: No difficulties  Cognition Arousal/Alertness: Awake/alert Behavior During Therapy: Anxious Overall Cognitive Status: Within Functional Limits for tasks assessed                                        General Comments      Exercises Other Exercises Other Exercises: 200' with RW, supplemental O2, cga/min assist-improved stablity and safety with use of RW, fewer episodes of LOB (though still with 1-2 throughout distance, esp as fatigue increased).  Slow cadence; limited balance reactions.  Unsafe to complete wtihout RW and +1 assist at this time. Other Exercises: Orthostatic vitals assessment (see flowsheets); vitals stable and WFL. Other Exercises: Brief visual assessment to evaluate vestibular assessment (to rule out cause for spontaneous LOB with upright positioning)-visual tracking WFL with good, conjugate gaze; no gaze-evoked nystagmus; smooth pursuits WFL ; convergence/divergence WFL; head-shaking nysgatmus negative; test of skew negative; VOR and VOR cancellation normal.  No reports of dizziness or LOB noted with testing.   Assessment/Plan    PT Assessment Patient needs  continued PT services  PT Problem List         PT Treatment Interventions DME instruction;Gait training;Functional mobility training;Therapeutic activities;Balance training;Therapeutic exercise;Patient/family education    PT Goals (Current goals can be found in the Care Plan section)  Acute Rehab PT Goals Patient Stated Goal: to be safe to go home PT Goal Formulation: With patient/family Time For Goal Achievement: 08/20/16 Potential to Achieve Goals: Good    Frequency Min 2X/week   Barriers to discharge Decreased caregiver support      Co-evaluation               AM-PAC PT "6 Clicks" Daily Activity  Outcome Measure Difficulty turning over in bed (including adjusting bedclothes, sheets and blankets)?: A Little Difficulty moving from lying on back to sitting on the side of the bed? : A Little Difficulty sitting down on and standing up from a chair with arms (e.g., wheelchair, bedside commode, etc,.)?: Total Help needed moving to and from a bed to chair (including a wheelchair)?: A Little Help needed walking in hospital room?: A Little Help needed climbing 3-5 steps with a railing? : A Lot 6 Click Score: 15    End of Session Equipment Utilized During Treatment: Gait belt;Oxygen Activity Tolerance:  (limited by anxiety, desat with exertion) Patient left: in bed;with call bell/phone within reach;with bed alarm set;with family/visitor present Nurse Communication: Mobility status PT Visit Diagnosis: Muscle weakness (generalized) (M62.81);Difficulty in walking, not elsewhere classified (R26.2)    Time: 5397-6734  PT Time Calculation (min) (ACUTE ONLY): 55 min   Charges:   PT Evaluation $PT Eval Moderate Complexity: 1 Procedure PT Treatments $Gait Training: 8-22 mins $Therapeutic Activity: 8-22 mins   PT G Codes:   PT G-Codes **NOT FOR INPATIENT CLASS** Functional Assessment Tool Used: AM-PAC 6 Clicks Basic Mobility Functional Limitation: Mobility: Walking and moving  around Mobility: Walking and Moving Around Current Status ([id number]): At least 40 percent but less than 60 percent impaired, limited or restricted Mobility: Walking and Moving Around Goal Status [phone]): At least 1 percent but less than 20 percent impaired, limited or restricted    Nikko Quast H. Owens Shark, PT, DPT, NCS 08/06/16, 5:28 PM [phone]

## 2016-08-06 NOTE — Progress Notes (Signed)
Patient has been resting comfortably  after the Ativan medication, respirations even and unlabored. S/S are stable.  Will continue to monitor.

## 2016-08-07 ENCOUNTER — Observation Stay: Payer: Medicare HMO

## 2016-08-07 DIAGNOSIS — R079 Chest pain, unspecified: Secondary | ICD-10-CM | POA: Diagnosis not present

## 2016-08-07 DIAGNOSIS — R0602 Shortness of breath: Secondary | ICD-10-CM | POA: Diagnosis not present

## 2016-08-07 DIAGNOSIS — R262 Difficulty in walking, not elsewhere classified: Secondary | ICD-10-CM | POA: Diagnosis not present

## 2016-08-07 DIAGNOSIS — I639 Cerebral infarction, unspecified: Secondary | ICD-10-CM | POA: Diagnosis not present

## 2016-08-07 DIAGNOSIS — R69 Illness, unspecified: Secondary | ICD-10-CM | POA: Diagnosis not present

## 2016-08-07 LAB — GLUCOSE, CAPILLARY
Glucose-Capillary: 82 mg/dL (ref 65–99)
Glucose-Capillary: 136 mg/dL — ABNORMAL HIGH (ref 65–99)

## 2016-08-07 MED ORDER — SERTRALINE HCL 50 MG PO TABS
25.0000 mg | ORAL_TABLET | Freq: Every day | ORAL | Status: DC
  Administered 2016-08-07 – 2016-08-09 (×3): 25 mg via ORAL
  Filled 2016-08-07 (×3): qty 1

## 2016-08-07 MED ORDER — PREDNISONE 50 MG PO TABS
50.0000 mg | ORAL_TABLET | Freq: Four times a day (QID) | ORAL | Status: AC
  Administered 2016-08-07 – 2016-08-08 (×3): 50 mg via ORAL
  Filled 2016-08-07 (×3): qty 1

## 2016-08-07 MED ORDER — DIPHENHYDRAMINE HCL 25 MG PO CAPS
50.0000 mg | ORAL_CAPSULE | Freq: Once | ORAL | Status: AC
  Administered 2016-08-07: 50 mg via ORAL
  Filled 2016-08-07: qty 2

## 2016-08-07 MED ORDER — DIPHENHYDRAMINE HCL 50 MG/ML IJ SOLN: 50.0000 mg | Freq: Once | INTRAMUSCULAR | Status: AC

## 2016-08-07 NOTE — Evaluation (Signed)
Occupational Therapy Evaluation Patient Details Name: Scott [name] MRN: [id number] DOB: [birth year] Today's Date: 08/07/2016    History of Present Illness Pt is a 76yo male who presented to ER on 7/13 secondary to worsening SOB, mid-sternal chest pain, attributed to anxiety attack at this point (additional medical work up negative for causative etiology).  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   Clinical Impression   Pt seen for OT evaluation this date. Pt sleepy but agreeable to OT session. Pt independent at baseline. No SOB/dizziness/lightheadedness noted throughout session. Double vision noted during assessment. Partially occluded L lens of glasses with 51M tape and pt reported double vision resolved. Encouraged pt to wear to minimize double vision temporarily. Pt also presents with some pain/decreased strength/mild tingling in L shoulder (previous injury) and impaired balance during functional mobility and ADL tasks when in sitting or standing. Pt indep with doff/donning socks at bed level. Pt and family expressed concerns about return home if today based on current functional mobility and frequent unexplained LOBs and continued double vision. Pt at increased risk for falls/injury/rehospitalization at this time requiring increased assist for functional mobility and ADL tasks. Pt will benefit from skilled OT services to address noted impairments and functional deficits in self care tasks in order to maximize return to PLOF. Will continue to assess for discharge recommendation after hospitalization.     Follow Up Recommendations  SNF    Equipment Recommendations       Recommendations for Other Services       Precautions / Restrictions Precautions Precautions: Fall Restrictions Weight Bearing Restrictions: No      Mobility Bed Mobility Overal bed  mobility: Independent Bed Mobility: Supine to Sit;Sit to Supine     Supine to sit: Independent Sit to supine: Independent   General bed mobility comments: no LOB noted, good confidence  Transfers Overall transfer level: Needs assistance Equipment used: Rolling walker (2 wheeled) Transfers: Sit to/from Stand Sit to Stand: Min guard         General transfer comment: Mod verbal cues for sequencing and min A for occasional instability during transfers    Balance Overall balance assessment: Needs assistance Sitting-balance support: No upper extremity supported;Feet supported Sitting balance-Leahy Scale: Poor Sitting balance - Comments: sudden, frequent LOB in random planes with unsupported sitting, requiring min assist for correction   Standing balance support: Bilateral upper extremity supported Standing balance-Leahy Scale: Fair Standing balance comment: Occasional Min A during amb for general stability.                           ADL either performed or assessed with clinical judgement   ADL Overall ADL's : Needs assistance/impaired                                       General ADL Comments: generally at supervision level for seated ADL tasks and PRN min assist for instability during functional mobility      Vision Baseline Vision/History: Wears glasses Wears Glasses:  (trifocals - wears for tv, church, not generally all the time or for mobility) Patient Visual Report: Diplopia Vision Assessment?: Yes Eye Alignment: Within Functional Limits Ocular Range of Motion: Within Functional Limits Tracking/Visual Pursuits: Able to track stimulus in all quads without difficulty Visual Fields:  No apparent deficits Diplopia Assessment: Disappears with one eye closed;Present in far gaze;Objects split side to side Additional Comments: partially occluded L lens of glasses with 65M tape, pt reported double vision resolved with this.     Perception      Praxis      Pertinent Vitals/Pain Pain Assessment: No/denies pain     Hand Dominance Right   Extremity/Trunk Assessment Upper Extremity Assessment Upper Extremity Assessment: Overall WFL for tasks assessed (L shoulder flexion MMT 3+/5 secondary to pain (not new), tingling in L shoulder noted (not new), otherwise ROM/MMT WFL bilaterally)   Lower Extremity Assessment Lower Extremity Assessment: Overall WFL for tasks assessed   Cervical / Trunk Assessment Cervical / Trunk Assessment: Normal   Communication Communication Communication: No difficulties   Cognition Arousal/Alertness: Awake/alert Behavior During Therapy: WFL for tasks assessed/performed Overall Cognitive Status: Within Functional Limits for tasks assessed                                     General Comments       Exercises Other Exercises Other Exercises: pt educated in body positioning for ADL tasks to minimize risk of orthostatic hypotension, as pt endorses sometimes getting lightheaded when laying down to sitting, sitting to standing up   Shoulder Instructions      Home Living Family/patient expects to be discharged to:: Private residence Living Arrangements: Spouse/significant other Available Help at Discharge: Family (spouse likely unable to provide much physical assist for mobility) Type of Home: House Home Access: Level entry     Home Layout: One level     Bathroom Shower/Tub: Occupational psychologist: Handicapped height Bathroom Accessibility: Yes How Accessible: Accessible via walker Home Equipment: Walker - 2 wheels          Prior Functioning/Environment Level of Independence: Independent        Comments: Indep for ADLs, household and community mobility; active, working on farm; + driving; denies fall history         OT Problem List: Impaired vision/perception;Impaired balance (sitting and/or standing);Decreased safety awareness      OT  Treatment/Interventions: Self-care/ADL training;Therapeutic exercise;Therapeutic activities;Energy conservation;Visual/perceptual remediation/compensation;DME and/or AE instruction;Patient/family education    OT Goals(Current goals can be found in the care plan section) Acute Rehab OT Goals Patient Stated Goal: to be safe to go home OT Goal Formulation: With patient/family Time For Goal Achievement: 08/21/16 Potential to Achieve Goals: Good  OT Frequency: Min 2X/week   Barriers to D/C: Decreased caregiver support          Co-evaluation              AM-PAC PT "6 Clicks" Daily Activity     Outcome Measure Help from another person eating meals?: None Help from another person taking care of personal grooming?: A Little Help from another person toileting, which includes using toliet, bedpan, or urinal?: A Little Help from another person bathing (including washing, rinsing, drying)?: A Little Help from another person to put on and taking off regular upper body clothing?: A Little Help from another person to put on and taking off regular lower body clothing?: A Little 6 Click Score: 19   End of Session    Activity Tolerance: Patient tolerated treatment well Patient left: in bed;with call bell/phone within reach;with bed alarm set;with nursing/sitter in room;with family/visitor present  OT Visit Diagnosis: Other abnormalities of gait and mobility (R26.89)  Time: 3329-5188 OT Time Calculation (min): 39 min Charges:  OT General Charges $OT Visit: 1 Procedure OT Evaluation $OT Eval Low Complexity: 1 Procedure OT Treatments $Self Care/Home Management : 8-22 mins G-Codes: OT G-codes **NOT FOR INPATIENT CLASS** Functional Assessment Tool Used: AM-PAC 6 Clicks Daily Activity;Clinical judgement Functional Limitation: Self care Self Care Current Status (C1660): At least 40 percent but less than 60 percent impaired, limited or restricted Self Care Goal Status (Y3016): At  least 20 percent but less than 40 percent impaired, limited or restricted   Jeni Salles, MPH, MS, OTR/L ascom [phone] 08/07/16, 3:44 PM

## 2016-08-07 NOTE — Plan of Care (Signed)
Problem: Self-Concept: Goal: Level of anxiety will decrease Outcome: Progressing Patient has not exhibited any s/s of anxiety this shift. Resting comfortably right now, respirations even and unlabored.

## 2016-08-07 NOTE — Progress Notes (Signed)
CCMD notified that patients Pacemaker isnt capturing. Notified Dr. Fletcher Anon who looked at saved strip and stated he does not think it looks abnormal but will look into it tomorrow.

## 2016-08-07 NOTE — Progress Notes (Signed)
Patient family expressed concerns that he still isn't well enough to go home. Yesterday it was mentioned to the family that he would have a VQ scan. Upon assessment there was no mention or order for a VQ scan. Let family know that I would get in contact with Dr. Verdell Carmine and get an idea for a plan for today. Dr. Verdell Carmine notified who stated he wasn't aware of a VQ scan ordered. He was under the impression that the plan was for PT to reevaluate patient and he would go home with home health today. Family notified will continue to monitor.

## 2016-08-07 NOTE — Progress Notes (Signed)
Physical Therapy Treatment Patient Details Name: Scott [name] MRN: [id number] DOB: 09-28-1940 Today's Date: 08/07/2016    History of Present Illness presented to ER secondary to worsening SOB, mid-sternal chest pain, attributed to anxiety attack at this point (additional medical work up negative for causative etiology).  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    PT Comments    Pt presents with deficits in strength, transfers, gait, and balance.  Pt Ind with bed mobility tasks but continues to present with frequent unexplained LOB in random planes while sitting at EOB with feet supported.  Pt reports no instances of SOB, dizziness, vertigo, or any other adverse symptoms.  Pt required Min A with transfers for stability and was able to amb 200' with RW and recliner follow with Min A for occasional instability.  Pt ambulates with moderately slow cadence with mild drifting/staggering left/right.  SpO2 and HR WNL on room air during session.  Agree with SNF recommendation secondary to significantly elevated fall risk and uncertain etiology of pt's symptoms for safe return to PLOF and eventual safe return home.      Follow Up Recommendations  SNF     Equipment Recommendations  Rolling walker with 5" wheels    Recommendations for Other Services       Precautions / Restrictions Precautions Precautions: Fall Restrictions Weight Bearing Restrictions: No    Mobility  Bed Mobility Overal bed mobility: Independent       Supine to sit: Independent Sit to supine: Independent      Transfers Overall transfer level: Needs assistance Equipment used: Rolling walker (2 wheeled) Transfers: Sit to/from Stand Sit to Stand: Min assist         General transfer comment: Mod verbal cues for sequencing and min A for occasional instability during  transfers  Ambulation/Gait Ambulation/Gait assistance: Min assist Ambulation Distance (Feet): 200 Feet Assistive device: Rolling walker (2 wheeled) Gait Pattern/deviations: Decreased step length - right;Decreased step length - left;Drifts right/left   Gait velocity interpretation: Below normal speed for age/gender General Gait Details: Slow cadence with gait with drifting/staggering right/left with occasional min A for stability.  Pt reported no SOB, dizziness, or other symptoms.  SpO2 and HR WNL during session on room air.    Stairs            Wheelchair Mobility    Modified Rankin (Stroke Patients Only)       Balance Overall balance assessment: Needs assistance Sitting-balance support: No upper extremity supported;Feet supported Sitting balance-Leahy Scale: Poor Sitting balance - Comments: sudden, frequent LOB in random planes with unsupported sitting, requiring min assist for correction   Standing balance support: Bilateral upper extremity supported Standing balance-Leahy Scale: Fair Standing balance comment: Occasional Min A during amb for general stability.                            Cognition Arousal/Alertness: Awake/alert Behavior During Therapy: WFL for tasks assessed/performed Overall Cognitive Status: Within Functional Limits for tasks assessed                                        Exercises Total Joint Exercises Ankle Circles/Pumps: AROM;Both;10 reps Hip ABduction/ADduction: AROM;Both;10 reps Long Arc Quad: Strengthening;Both;5 reps;10 reps Knee Flexion: Strengthening;Both;5 reps;10 reps Marching in  Standing: AROM;Both;10 reps Other Exercises Other Exercises: Seated B marching in place x 10 Other Exercises: Standing mini squats x 5    General Comments        Pertinent Vitals/Pain Pain Assessment: No/denies pain    Home Living                      Prior Function            PT Goals (current goals can  now be found in the care plan section) Progress towards PT goals: Progressing toward goals    Frequency    Min 2X/week      PT Plan Current plan remains appropriate    Co-evaluation              AM-PAC PT "6 Clicks" Daily Activity  Outcome Measure  Difficulty turning over in bed (including adjusting bedclothes, sheets and blankets)?: None Difficulty moving from lying on back to sitting on the side of the bed? : None Difficulty sitting down on and standing up from a chair with arms (e.g., wheelchair, bedside commode, etc,.)?: Total Help needed moving to and from a bed to chair (including a wheelchair)?: A Little Help needed walking in hospital room?: A Little Help needed climbing 3-5 steps with a railing? : A Little 6 Click Score: 18    End of Session Equipment Utilized During Treatment: Gait belt Activity Tolerance: Patient tolerated treatment well Patient left: in bed;with bed alarm set;with call bell/phone within reach;with family/visitor present Nurse Communication: Mobility status       Time: 8016-5537 PT Time Calculation (min) (ACUTE ONLY): 32 min  Charges:  $Gait Training: 8-22 mins $Therapeutic Exercise: 8-22 mins                    G Codes:       DRoyetta Asal PT, DPT 08/07/16, 1:22 PM

## 2016-08-07 NOTE — Care Management (Addendum)
CM assessment due to 3 day observation.  Spoke with patient, wife and daughter.  Prior to Friday July 13, patient was independent in all adls, no issues accessing medical care, obtaining medications or with transportation.  Able to drive.  Current with  PCP.  Friday patient became short of breath with chest pain and required bipap initially.  Family says that patient has no previous history of anxiety and was on no medication for anxiety.  If there is an anxiety component  now it is new.  Patient is having "blurred vision" but when questioned further patient and family describe vision as "double vision."  Patient is seeing two of things at times.  He is unable to sit up on the side of the bed independently without losing balance in which he falls forward, backward or to the side.  He is unable to ambulate unassisted which is not his baseline. Discussed with attending.  Patient has had head CT and brain MRI which is negative.  Patient is on chronic Eliquis so Lung VQ not indicated to rule out pulmonary embolus. Discussed possible benefit of neuro consult?   Family desperately want to take patient home but want to know "what is "wrong" with patient- as his current status is far below his norm.

## 2016-08-07 NOTE — Progress Notes (Signed)
[facility] [place] NAME: Scott [name]    MR#:  [id number]  DATE OF BIRTH:  [birth year]  SUBJECTIVE:   Patient here due to shortness of breath, chest pain. Still having some periods of shortness of breath on minimal exertion but not truly hypoxic. Family also very concerned that the patient is not able to ambulate and not himself since this past Friday. Underwent CT scan of the head and MRI of the brain which were negative for any acute intracranial pathology. Currently on room air. Patient is allergic to intravenous contrast. he is already on Eliquis for anticoagulation.  REVIEW OF SYSTEMS:    Review of Systems  Constitutional: Negative for chills and fever.  HENT: Negative for congestion and tinnitus.   Eyes: Negative for blurred vision and double vision.  Respiratory: Negative for cough, shortness of breath and wheezing.   Cardiovascular: Negative for chest pain, orthopnea and PND.  Gastrointestinal: Negative for abdominal pain, diarrhea, nausea and vomiting.  Genitourinary: Negative for dysuria and hematuria.  Neurological: Negative for dizziness, sensory change and focal weakness.  All other systems reviewed and are negative.   Nutrition: Heart Healthy Tolerating Diet: Yes Tolerating PT: Eval noted.   DRUG ALLERGIES:   Allergies  Allergen Reactions  . Contrast Media [Iodinated Diagnostic Agents] Hives  . Oxycodone Hcl Itching  . Tramadol Itching  . Vicodin [Hydrocodone-Acetaminophen] Itching    VITALS:  Blood pressure 133/69, pulse 80, temperature (!) 97.4 F (36.3 C), temperature source Oral, resp. rate 18, height 5\' 10"  (1.778 m), weight 73.9 kg (162 lb 14.4 oz), SpO2 98 %.  PHYSICAL EXAMINATION:   Physical Exam  GENERAL:  76 y.o.-year-old patient lying in bed in no acute distress.  EYES: Pupils equal, round, reactive to light and accommodation. No scleral icterus. Extraocular muscles intact.  HEENT: Head atraumatic,  normocephalic. Oropharynx and nasopharynx clear.  NECK:  Supple, no jugular venous distention. No thyroid enlargement, no tenderness.  LUNGS: Normal breath sounds bilaterally, no wheezing, rales, rhonchi. No use of accessory muscles of respiration.  CARDIOVASCULAR: S1, S2 normal. No murmurs, rubs, or gallops.  ABDOMEN: Soft, nontender, nondistended. Bowel sounds present. No organomegaly or mass.  EXTREMITIES: No cyanosis, clubbing or edema b/l.    NEUROLOGIC: Cranial nerves II through XII are intact. No focal Motor or sensory deficits b/l.   PSYCHIATRIC: The patient is alert and oriented x 3.  SKIN: No obvious rash, lesion, or ulcer.    LABORATORY PANEL:   CBC  Recent Labs Lab 08/05/16 0333  WBC 5.7  HGB 12.1*  HCT 36.8*  PLT 232   ------------------------------------------------------------------------------------------------------------------  Chemistries   Recent Labs Lab 08/05/16 0333  NA 139  K 3.9  CL 108  CO2 25  GLUCOSE 102*  BUN 15  CREATININE 1.09  CALCIUM 8.8*   ------------------------------------------------------------------------------------------------------------------  Cardiac Enzymes  Recent Labs Lab 08/05/16 0333  TROPONINI <0.03   ------------------------------------------------------------------------------------------------------------------  RADIOLOGY:  Ct Head Wo Contrast  Result Date: 08/07/2016 CLINICAL DATA:  Difficulty walking EXAM: CT HEAD WITHOUT CONTRAST TECHNIQUE: Contiguous axial images were obtained from the base of the skull through the vertex without intravenous contrast. COMPARISON:  None. FINDINGS: Brain: No evidence of acute infarction, hemorrhage, hydrocephalus, extra-axial collection or mass lesion/mass effect. Mild cortical atrophy. Mild subcortical white matter and periventricular small vessel ischemic changes. Vascular: No hyperdense vessel or unexpected calcification. Skull: Normal. Negative for fracture or focal  lesion. Sinuses/Orbits: The visualized paranasal sinuses are essentially clear. The mastoid air cells  are unopacified. Other: None. IMPRESSION: No evidence of acute intracranial abnormality. Mild atrophy with small vessel ischemic changes. Electronically Signed   By: Julian Hy M.D.   On: 08/07/2016 10:26   Mr Brain Wo Contrast  Result Date: 08/07/2016 CLINICAL DATA:  Cerebral infarction. EXAM: MRI HEAD WITHOUT CONTRAST TECHNIQUE: Multiplanar, multiecho pulse sequences of the brain and surrounding structures were obtained without intravenous contrast. COMPARISON:  CT head 08/07/2016 FINDINGS: Brain: Image quality degraded by mild motion Cerebral volume normal for age.  Negative for hydrocephalus. Negative for acute infarct. Scattered small white matter hyperintensities consistent with mild chronic microvascular ischemia. Negative for hemorrhage or mass. No shift of the midline structures. Vascular: Were normal arterial flow voids. Skull and upper cervical spine: Negative Sinuses/Orbits: Negative Other: None IMPRESSION: No acute intracranial abnormality. Mild chronic white matter microvascular ischemia Electronically Signed   By: Franchot Gallo M.D.   On: 08/07/2016 12:00     ASSESSMENT AND PLAN:   76 year old male with past medical history of hypertension, hyperlipidemia, chronic chest pain, chronic systolic CHF, ischemic cardiomyopathy, osteoarthritis who presented to the hospital due to shortness of breath and chest pain and also noted to have difficulty walking.   1. Double vision/difficulty walking-etiology unclear presently. This has developed since about 3-4 days ago. -CT head, MRI of the brain are negative for acute pathology. Seen by physical therapy and he recommended short-term rehabilitation. -No acute evidence of seizures. I will consult neurology who will see patient tomorrow.  2. Chest pain, shortness of breath-etiology unclear. Clinically patient is not noted to be hypoxic as he  is on room air but apparently was hypoxic upon ambulation yesterday. Patient is already on Eliquis anticoagulation and the likelihood for having thromboembolism is low. -I will premedicate the patient for contrast allergy and get a CT angiogram of the chest to rule out PE today.  3. Anxiety-seen by psychiatry and thought to having minor panic attacks. Continue Xanax, started on some Zoloft.  4. Essential hypertension-continue ramipril, Coreg.   5. Hyperlipidemia - cont. Atorvastatin.   6. Hx of previous DVT/PE - cont. Eliquis   All the records are reviewed and case discussed with Care Management/Social Worker. Management plans discussed with the patient, family and they are in agreement.  CODE STATUS: Full code  DVT Prophylaxis: Eliquis  TOTAL TIME TAKING CARE OF THIS PATIENT: 30 minutes.   POSSIBLE D/C IN 1-2 DAYS, DEPENDING ON CLINICAL CONDITION.   Henreitta Leber M.D on 08/07/2016 at 3:24 PM  Between 7am to 6pm - Pager - [phone]  After 6pm go to [email or web address] - Proofreader  [facility]  Office  [phone]  CC: Primary care physician; Madelyn Brunner, MD

## 2016-08-08 ENCOUNTER — Observation Stay: Payer: Medicare HMO

## 2016-08-08 DIAGNOSIS — R69 Illness, unspecified: Secondary | ICD-10-CM | POA: Diagnosis not present

## 2016-08-08 DIAGNOSIS — R2681 Unsteadiness on feet: Secondary | ICD-10-CM | POA: Diagnosis not present

## 2016-08-08 DIAGNOSIS — R262 Difficulty in walking, not elsewhere classified: Secondary | ICD-10-CM | POA: Diagnosis not present

## 2016-08-08 DIAGNOSIS — I2699 Other pulmonary embolism without acute cor pulmonale: Secondary | ICD-10-CM | POA: Diagnosis not present

## 2016-08-08 DIAGNOSIS — I2510 Atherosclerotic heart disease of native coronary artery without angina pectoris: Secondary | ICD-10-CM | POA: Diagnosis not present

## 2016-08-08 DIAGNOSIS — R600 Localized edema: Secondary | ICD-10-CM | POA: Diagnosis not present

## 2016-08-08 LAB — URINALYSIS, COMPLETE (UACMP) WITH MICROSCOPIC
BACTERIA UA: NONE SEEN
BILIRUBIN URINE: NEGATIVE
Glucose, UA: NEGATIVE mg/dL
HGB URINE DIPSTICK: NEGATIVE
KETONES UR: NEGATIVE mg/dL
LEUKOCYTES UA: NEGATIVE
NITRITE: NEGATIVE
PROTEIN: NEGATIVE mg/dL
Specific Gravity, Urine: 1.032 — ABNORMAL HIGH (ref 1.005–1.030)
Squamous Epithelial / LPF: NONE SEEN
pH: 5 (ref 5.0–8.0)

## 2016-08-08 LAB — CREATININE, SERUM
Creatinine, Ser: 1.1 mg/dL (ref 0.61–1.24)
GFR calc Af Amer: >60 mL/min (ref >60)
GFR calc non Af Amer: >60 mL/min (ref >60)

## 2016-08-08 LAB — CBC
HEMATOCRIT: 40.2 % (ref 40.0–52.0)
Hemoglobin: 13 g/dL (ref 13.0–18.0)
MCH: 25.3 pg — ABNORMAL LOW (ref 26.0–34.0)
MCHC: 32.4 g/dL (ref 32.0–36.0)
MCV: 78 fL — AB (ref 80.0–100.0)
Platelets: 273 10*3/uL (ref 150–440)
RBC: 5.16 MIL/uL (ref 4.40–5.90)
RDW: 16.7 % — AB (ref 11.5–14.5)
WBC: 14.1 10*3/uL — AB (ref 3.8–10.6)

## 2016-08-08 LAB — GLUCOSE, CAPILLARY
GLUCOSE-CAPILLARY: 149 mg/dL — AB (ref 65–99)
Glucose-Capillary: 137 mg/dL — ABNORMAL HIGH (ref 65–99)
Glucose-Capillary: 158 mg/dL — ABNORMAL HIGH (ref 65–99)

## 2016-08-08 LAB — ANTITHROMBIN III: ANTITHROMB III FUNC: 93 % (ref 75–120)

## 2016-08-08 LAB — VITAMIN B12: Vitamin B-12: 273 pg/mL (ref 180–914)

## 2016-08-08 LAB — TSH: TSH: 0.253 u[IU]/mL — ABNORMAL LOW (ref 0.350–4.500)

## 2016-08-08 LAB — SEDIMENTATION RATE: SED RATE: 8 mm/h (ref 0–20)

## 2016-08-08 MED ORDER — ACETAMINOPHEN 325 MG PO TABS: 650.0000 mg | ORAL_TABLET | Freq: Four times a day (QID) | ORAL | Status: DC | PRN

## 2016-08-08 MED ORDER — RIVAROXABAN 15 MG PO TABS
15.0000 mg | ORAL_TABLET | Freq: Two times a day (BID) | ORAL | Status: DC
  Administered 2016-08-08 – 2016-08-09 (×2): 15 mg via ORAL
  Filled 2016-08-08 (×3): qty 1

## 2016-08-08 MED ORDER — ISOSORBIDE MONONITRATE ER 30 MG PO TB24
30.0000 mg | ORAL_TABLET | Freq: Every day | ORAL | Status: DC
  Administered 2016-08-08 – 2016-08-09 (×2): 30 mg via ORAL
  Filled 2016-08-08 (×2): qty 1

## 2016-08-08 MED ORDER — RIVAROXABAN 15 MG PO TABS: 15.0000 mg | ORAL_TABLET | Freq: Two times a day (BID) | ORAL | Status: DC

## 2016-08-08 MED ORDER — SENNOSIDES-DOCUSATE SODIUM 8.6-50 MG PO TABS
1.0000 | ORAL_TABLET | Freq: Two times a day (BID) | ORAL | Status: DC
  Administered 2016-08-08 – 2016-08-09 (×2): 1 via ORAL
  Filled 2016-08-08 (×2): qty 1

## 2016-08-08 MED ORDER — DIPHENHYDRAMINE HCL 50 MG/ML IJ SOLN
50.0000 mg | Freq: Once | INTRAMUSCULAR | Status: AC
  Administered 2016-08-08: 50 mg via INTRAVENOUS

## 2016-08-08 MED ORDER — APIXABAN 5 MG PO TABS
5.0000 mg | ORAL_TABLET | Freq: Once | ORAL | Status: AC
  Administered 2016-08-08: 5 mg via ORAL
  Filled 2016-08-08: qty 1

## 2016-08-08 MED ORDER — IOPAMIDOL (ISOVUE-370) INJECTION 76%
75.0000 mL | Freq: Once | INTRAVENOUS | Status: AC | PRN
  Administered 2016-08-08: 75 mL via INTRAVENOUS

## 2016-08-08 MED ORDER — DIPHENHYDRAMINE HCL 50 MG/ML IJ SOLN
INTRAMUSCULAR | Status: AC
  Filled 2016-08-08: qty 1

## 2016-08-08 NOTE — Consult Note (Signed)
Reason for Consult:Gait abnormalities Referring Physician: Tressia Miners  CC: Gait abnormalities  HPI: Scott [name] is an 76 y.o. male with a history of difficulty with balance since his cardiac surgery.  Reports that at times he would have to hold onto something when walking  But was otherwise independent and able to do anything [place] that he wanted to do.  On the day of admission had acute worsening of his gait.  Does not report a vertigo but reports bouts of feeling off balance.  Since admission this has improved but his gait is still not back to baseline.   Patient also reports horizontal diplopia that occurred yesterday and has spontaneously resolved.  Past Medical History:  Diagnosis Date  . Arthritis    right hip, since a fall  . CAD (coronary artery disease)    a. 04/2012 Cath: 3VD->Med Rx;  b. 04/2013 PCI RCA (2 DES); c. 03/2014 PCI: LAD 80 (3.0x23 Xience Alpine DES), 30 ISR in RCA; d. 06/2014 CABG x 2 ([facility]) LIMA->LAD, VG->OM; e. 11/2014 Neg MV;  f. 12/2014 Cath ([facility]): patent grafts->Med Rx; g. 03/2015 MV ([facility]) low risk, EF 40%; h. 05/2015 MV: low risk; i. 08/2015 Cath: patent grafts, patent RCA/LAD stents, small LCX/OM->Med Rx.  . Cardiomyopathy, ischemic    a. 04/2012 Echo: EF 40-45%;  b. 08/2013 Echo: EF 45-50%, mild glob HK, mod lat/post HK, diast dysfxn, mildly dil LA, mild MR, nl RVSP; c. 01/2015 Echo: EF 40-45%, Gr 1 DD, mildly dil LA.  . Carotid arterial disease ([place])    a. 05/2013 Carotid U/S; bilat 40-50% ICA stenosis.  . Chronic Chest Pain   . Chronic systolic CHF (congestive heart failure) ([place])    a. 01/2015 Echo: EF 40-45%.  . Cough   . Dyspnea    pt. unsure of exertion cause- that creates SOB at times   . Headache 1970's   migraine  . History of blood transfusion 2016   post op  . Hyperlipidemia   . Hypertension   . Myocardial infarction [facility])    unsure of when    Past Surgical History:  Procedure Laterality Date  . CARDIAC CATHETERIZATION  05/01/2012  .  CARDIAC CATHETERIZATION  04/2013   [facility];x3 stent  . CARDIAC CATHETERIZATION  01/11/15    [facility]  . CARDIAC CATHETERIZATION N/A 09/16/2015   Procedure: LEFT HEART CATH AND CORS/GRAFTS ANGIOGRAPHY;  Surgeon: Minna Merritts, MD;  Location: [place] CV LAB;  Service: Cardiovascular;  Laterality: N/A;  . CARPAL TUNNEL RELEASE     right hand  . CATARACT EXTRACTION     LEFT  . CATARACT EXTRACTION W/PHACO Left 09/16/2014   Procedure: CATARACT EXTRACTION PHACO AND INTRAOCULAR LENS PLACEMENT (IOC);  Surgeon: Leandrew Koyanagi, MD;  Location: [place];  Service: Ophthalmology;  Laterality: Left;  . COLONOSCOPY    . COLONOSCOPY WITH PROPOFOL N/A 01/28/2016   Procedure: COLONOSCOPY WITH PROPOFOL;  Surgeon: Lollie Sails, MD;  Location: [facility] ENDOSCOPY;  Service: Endoscopy;  Laterality: N/A;  . CORONARY ANGIOPLASTY WITH STENT PLACEMENT  04/13/2014  . CORONARY ARTERY BYPASS GRAFT  06-26-14   x3 bypasses  . DE QUERVAIN'S RELEASE Left 08/22/2012  . ESOPHAGOGASTRODUODENOSCOPY (EGD) WITH PROPOFOL N/A 04/26/2015   Procedure: ESOPHAGOGASTRODUODENOSCOPY (EGD) WITH PROPOFOL;  Surgeon: Hulen Luster, MD;  Location: [facility] ENDOSCOPY;  Service: Gastroenterology;  Laterality: N/A;  . RIB PLATING N/A 05/25/2016   Procedure: STERNAL PLATING;  Surgeon: Melrose Nakayama, MD;  Location: [place];  Service: Thoracic;  Laterality: N/A;  .  right shoulder    . STERNAL WIRES REMOVAL N/A 03/30/2016   Procedure: STERNAL WIRES REMOVAL;  Surgeon: Melrose Nakayama, MD;  Location: [facility] OR;  Service: Thoracic;  Laterality: N/A;  . TONSILLECTOMY      Family History  Problem Relation Age of Onset  . Heart attack Father 68       MI  . Cancer Maternal Uncle     Social History:  reports that he has quit smoking. His smoking use included Cigarettes. He started smoking about 7 years ago. He has a 35.00 pack-year smoking history. He has never used smokeless tobacco. He reports that he does not drink alcohol or use  drugs.  Allergies  Allergen Reactions  . Contrast Media [Iodinated Diagnostic Agents] Hives  . Oxycodone Hcl Itching  . Tramadol Itching  . Vicodin [Hydrocodone-Acetaminophen] Itching    Medications:  I have reviewed the patient's current medications. Prior to Admission:  Prescriptions Prior to Admission  Medication Sig Dispense Refill Last Dose  . apixaban (ELIQUIS) 5 MG TABS tablet Take 1 tablet (5 mg total) by mouth 2 (two) times daily. 60 tablet  08/04/2016 at 0800  . atorvastatin (LIPITOR) 80 MG tablet Take 80 mg by mouth at bedtime.   08/03/2016 at Unknown time  . carvedilol (COREG) 3.125 MG tablet Take 1 tablet (3.125 mg total) by mouth daily before breakfast. 90 tablet 3 08/04/2016 at 0800  . isosorbide mononitrate (IMDUR) 60 MG 24 hr tablet Take 60 mg by mouth daily before breakfast. Before breakfast    08/04/2016 at 0800  . nitroGLYCERIN (NITROSTAT) 0.4 MG SL tablet Place 1 tablet (0.4 mg total) under the tongue every 5 (five) minutes as needed for chest pain. 25 tablet 6 08/04/2016 at Unknown time  . ramipril (ALTACE) 2.5 MG capsule Take 1 capsule (2.5 mg total) by mouth daily. 90 capsule 3 08/03/2016 at Unknown time  . traMADol (ULTRAM) 50 MG tablet Take 1-2 tablets (50-100 mg total) by mouth every 6 (six) hours as needed for moderate pain. 30 tablet 0 prn at prn  . zolpidem (AMBIEN) 5 MG tablet Take 5 mg by mouth at bedtime as needed for sleep.    prn at prn   Scheduled: . ALPRAZolam  0.5 mg Oral BID  . atorvastatin  80 mg Oral QHS  . carvedilol  3.125 mg Oral QAC breakfast  . isosorbide mononitrate  30 mg Oral Daily  . pneumococcal 23 valent vaccine  0.5 mL Intramuscular Tomorrow-1000  . Rivaroxaban  15 mg Oral BID WC  . senna-docusate  1 tablet Oral BID  . sertraline  25 mg Oral Daily    ROS: History obtained from the patient  General ROS: negative for - chills, fatigue, fever, night sweats, weight gain or weight loss Psychological ROS: anxiety Ophthalmic ROS: as  noted in HPI ENT ROS: negative for - epistaxis, nasal discharge, oral lesions, sore throat, tinnitus or vertigo Allergy and Immunology ROS: negative for - hives or itchy/watery eyes Hematological and Lymphatic ROS: negative for - bleeding problems, bruising or swollen lymph nodes Endocrine ROS: negative for - galactorrhea, hair pattern changes, polydipsia/polyuria or temperature intolerance Respiratory ROS: shortness of breath Cardiovascular ROS: negative for - chest pain, dyspnea on exertion, edema or irregular heartbeat Gastrointestinal ROS: negative for - abdominal pain, diarrhea, hematemesis, nausea/vomiting or stool incontinence Genito-Urinary ROS: negative for - dysuria, hematuria, incontinence or urinary frequency/urgency Musculoskeletal ROS: negative for - joint swelling or muscular weakness Neurological ROS: as noted in HPI Dermatological ROS: negative for  rash and skin lesion changes   Physical Examination: Blood pressure 133/69, pulse 78, temperature 97.8 F (36.6 C), temperature source Oral, resp. rate 18, height 5\' 10"  (1.778 m), weight 73.1 kg (161 lb 3.2 oz), SpO2 96 %.  HEENT-  Normocephalic, no lesions, without obvious abnormality.  Normal external eye and conjunctiva.  Normal TM's bilaterally.  Normal auditory canals and external ears. Normal external nose, mucus membranes and septum.  Normal pharynx. Cardiovascular- S1, S2 normal, pulses palpable throughout   Lungs- chest clear, no wheezing, rales, normal symmetric air entry Abdomen- soft, non-tender; bowel sounds normal; no masses,  no organomegaly Extremities- no edema Lymph-no adenopathy palpable Musculoskeletal-no joint tenderness, deformity or swelling Skin-areas of hyperpigmentation on face  Neurological Examination   Mental Status: Alert, oriented, but tangential and at times inappropriate.  Speech fluent without evidence of aphasia.  Able to follow 3 step commands without difficulty. Cranial Nerves: II: Discs  flat bilaterally; Visual fields grossly normal, pupils equal, round, reactive to light and accommodation III,IV, VI: ptosis not present, extra-ocular motions intact bilaterally V,VII: smile symmetric, facial light touch sensation normal bilaterally VIII: hearing normal bilaterally IX,X: gag reflex present XI: bilateral shoulder shrug XII: midline tongue extension Motor: Right : Upper extremity   5/5    Left:     Upper extremity   5/5  Lower extremity   5/5     Lower extremity   5/5 Tone and bulk:normal tone throughout; no atrophy noted Sensory: Pinprick and light touch intact throughout, bilaterally.  Decreased vibratory sensation in the lower extremities to the hips bilaterally Deep Tendon Reflexes: 2+ and symmetric throughout Plantars: Right: mute   Left: mute Cerebellar: Normal finger-to-nose and normal heel-to-shin testing bilaterally Gait: Gait slightly wide based initially but then became narrow based.  Would without warning have episode of loss of balance, almost lurching, but patient able to self-correct.     Laboratory Studies:   Basic Metabolic Panel:  Recent Labs Lab 08/04/16 1410 08/05/16 0333 08/08/16 0549  NA 138 139  --   K 3.8 3.9  --   CL 106 108  --   CO2 22 25  --   GLUCOSE 86 102*  --   BUN 17 15  --   CREATININE 1.19 1.09 1.10  CALCIUM 9.2 8.8*  --     Liver Function Tests: No results for input(s): AST, ALT, ALKPHOS, BILITOT, PROT, ALBUMIN in the last 168 hours. No results for input(s): LIPASE, AMYLASE in the last 168 hours. No results for input(s): AMMONIA in the last 168 hours.  CBC:  Recent Labs Lab 08/04/16 1410 08/05/16 0333 08/08/16 0549  WBC 6.1 5.7 14.1*  HGB 12.6* 12.1* 13.0  HCT 38.6* 36.8* 40.2  MCV 78.1* 78.0* 78.0*  PLT 265 232 273    Cardiac Enzymes:  Recent Labs Lab 08/04/16 1410 08/04/16 1553 08/04/16 2115 08/05/16 0333  TROPONINI <0.03 <0.03 <0.03 <0.03    BNP: Invalid input(s): POCBNP  CBG:  Recent  Labs Lab 08/07/16 0732 08/07/16 1214 08/08/16 0751 08/08/16 1143 08/08/16 1637  GLUCAP 82 136* 137* 158* 149*    Microbiology: Results for orders placed or performed during the hospital encounter of 05/25/16  Aerobic/Anaerobic Culture (surgical/deep wound)     Status: None   Collection Time: 05/25/16  9:44 AM  Result Value Ref Range Status   Specimen Description BONE MARROW  Final   Special Requests STERNAL  Final   Gram Stain NO WBC SEEN NO ORGANISMS SEEN   Final  Culture No growth aerobically or anaerobically.  Final   Report Status 05/30/2016 FINAL  Final  Acid Fast Culture with reflexed sensitivities     Status: None   Collection Time: 05/25/16  9:48 AM  Result Value Ref Range Status   Acid Fast Culture Negative  Final    Comment: (NOTE) No acid fast bacilli isolated after 6 weeks. Performed At: [facility] [place], [place] [id number] Lindon Romp MD [id number]    Source of Sample TISSUE  Final    Comment: STERNAL PATIENT ON FOLLOWING ZINACEF   Acid Fast Smear (AFB)     Status: None   Collection Time: 05/25/16  9:48 AM  Result Value Ref Range Status   AFB Specimen Processing Comment  Final    Comment: Tissue Grinding and Digestion/Decontamination   Acid Fast Smear Negative  Final    Comment: (NOTE) Performed At: [facility] [place], [place] [id number] Lindon Romp MD [id number]    Source (AFB) TISSUE  Final    Comment: STERNAL PATIENT ON FOLLOWING ZINACEF   Fungus Culture With Stain     Status: None   Collection Time: 05/25/16  9:48 AM  Result Value Ref Range Status   Fungus Stain Final report  Final   Fungus (Mycology) Culture Final report  Final    Comment: (NOTE) Performed At: [facility] [place], [place] [id number] Lindon Romp MD [id number]    Fungal Source TISSUE  Final    Comment: STERNAL PATIENT ON FOLLOWING ZINACEF   Fungus Culture Result      Status: None   Collection Time: 05/25/16  9:48 AM  Result Value Ref Range Status   Result 1 Comment  Final    Comment: (NOTE) KOH/Calcofluor preparation:  no fungus observed. Performed At: [facility] [place], [place] [id number] Lindon Romp MD [id number]   Fungal organism reflex     Status: None   Collection Time: 05/25/16  9:48 AM  Result Value Ref Range Status   Fungal result 1 Comment  Final    Comment: (NOTE) No yeast or mold isolated after 4 weeks. Performed At: [facility] [place], [place] [id number] Lindon Romp MD [id number]   Acid Fast Culture with reflexed sensitivities     Status: None   Collection Time: 05/25/16  9:54 AM  Result Value Ref Range Status   Acid Fast Culture Negative  Final    Comment: (NOTE) No acid fast bacilli isolated after 6 weeks. Performed At: [facility] [place], [place] [id number] Lindon Romp MD [id number]    Source of Sample BONE MARROW  Final  Acid Fast Smear (AFB)     Status: None   Collection Time: 05/25/16  9:54 AM  Result Value Ref Range Status   AFB Specimen Processing Comment  Final    Comment: Tissue Grinding and Digestion/Decontamination   Acid Fast Smear Negative  Final    Comment: (NOTE) Performed At: [facility] [address] [place], [place] [id number] Lindon Romp MD [id number]    Source (AFB) STERNAL  Final    Coagulation Studies: No results for input(s): LABPROT, INR in the last 72 hours.  Urinalysis:  Recent Labs Lab 08/08/16 1020  COLORURINE YELLOW*  LABSPEC 1.032*  PHURINE 5.0  GLUCOSEU NEGATIVE  HGBUR NEGATIVE  BILIRUBINUR NEGATIVE  KETONESUR NEGATIVE  PROTEINUR NEGATIVE  NITRITE NEGATIVE  LEUKOCYTESUR NEGATIVE    Lipid  Panel:     Component Value Date/Time   CHOL 111 06/12/2016 0954   CHOL 122 12/15/2013 0748   TRIG 84 06/12/2016 0954   TRIG 76 12/15/2013 0748   HDL 41 06/12/2016  0954   HDL 51 12/15/2013 0748   CHOLHDL 2.7 06/12/2016 0954   VLDL 15 12/15/2013 0748   LDLCALC 53 06/12/2016 0954   LDLCALC 56 12/15/2013 0748    HgbA1C: No results found for: HGBA1C  Urine Drug Screen:  No results found for: LABOPIA, COCAINSCRNUR, LABBENZ, AMPHETMU, THCU, LABBARB  Alcohol Level: No results for input(s): ETH in the last 168 hours.  Other results: EKG:  sinus rhythm at 68 bpm.  Imaging: Ct Head Wo Contrast  Result Date: 08/07/2016 CLINICAL DATA:  Difficulty walking EXAM: CT HEAD WITHOUT CONTRAST TECHNIQUE: Contiguous axial images were obtained from the base of the skull through the vertex without intravenous contrast. COMPARISON:  None. FINDINGS: Brain: No evidence of acute infarction, hemorrhage, hydrocephalus, extra-axial collection or mass lesion/mass effect. Mild cortical atrophy. Mild subcortical white matter and periventricular small vessel ischemic changes. Vascular: No hyperdense vessel or unexpected calcification. Skull: Normal. Negative for fracture or focal lesion. Sinuses/Orbits: The visualized paranasal sinuses are essentially clear. The mastoid air cells are unopacified. Other: None. IMPRESSION: No evidence of acute intracranial abnormality. Mild atrophy with small vessel ischemic changes. Electronically Signed   By: Julian Hy M.D.   On: 08/07/2016 10:26   Ct Angio Chest Pe W Or Wo Contrast  Result Date: 08/08/2016 CLINICAL DATA:  76 year old male with shortness of breath. EXAM: CT ANGIOGRAPHY CHEST WITH CONTRAST TECHNIQUE: Multidetector CT imaging of the chest was performed using the standard protocol during bolus administration of intravenous contrast. Multiplanar CT image reconstructions and MIPs were obtained to evaluate the vascular anatomy. CONTRAST:  75 cc Isovue 370 COMPARISON:  Chest radiograph dated 08/04/2016 FINDINGS: Cardiovascular: There is no cardiomegaly or pericardial effusion. Advanced multi vessel coronary vascular calcification and  CABG changes noted. The thoracic aorta is unremarkable for the degree of enhancement. There is pulmonary artery embolus extending from the left main pulmonary artery into the anterior left upper lobe segmental branch. The clot appears to be nearly completely occlusive. No other pulmonary artery embolus identified. There is no CT evidence of right heart straining. The right ventricle measures up to 3.9 cm and the left ventricle measures up to 5.1 cm in maximal diameter. Mediastinum/Nodes: There is no hilar or mediastinal adenopathy. The esophagus and the thyroid gland are grossly unremarkable. Lungs/Pleura: The lungs are clear. There is no pleural effusion or pneumothorax. The central airways are patent. Upper Abdomen: No acute abnormality. Musculoskeletal: There is mild degenerative changes of the spine. There has been interval placement of sternal fixation plate and screws. No definite fluid collection. No acute fracture. Review of the MIP images confirms the above findings. IMPRESSION: 1. 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 No CT evidence of right heart straining. 2. Multi vessel coronary vascular calcification and CABG surgery changes. These results were called by telephone at the time of interpretation on 08/08/2016 at 4:47 am to Nurse Valere Dross who verbally acknowledged these results. Electronically Signed   By: Anner Crete M.D.   On: 08/08/2016 05:00   Mr Brain Wo Contrast  Result Date: 08/07/2016 CLINICAL DATA:  Cerebral infarction. EXAM: MRI HEAD WITHOUT CONTRAST TECHNIQUE: Multiplanar, multiecho pulse sequences of the brain and surrounding structures were obtained without intravenous contrast. COMPARISON:  CT  head 08/07/2016 FINDINGS: Brain: Image quality degraded by mild motion Cerebral volume normal for age.  Negative for hydrocephalus. Negative for acute infarct. Scattered small white matter  hyperintensities consistent with mild chronic microvascular ischemia. Negative for hemorrhage or mass. No shift of the midline structures. Vascular: Were normal arterial flow voids. Skull and upper cervical spine: Negative Sinuses/Orbits: Negative Other: None IMPRESSION: No acute intracranial abnormality. Mild chronic white matter microvascular ischemia Electronically Signed   By: Franchot Gallo M.D.   On: 08/07/2016 12:00   US Venous Img Lower Bilateral  Result Date: 08/08/2016 CLINICAL DATA:  Bilateral lower extremity edema. History of pulmonary embolism. Former smoker. Evaluate for DVT. EXAM: BILATERAL LOWER EXTREMITY VENOUS DOPPLER ULTRASOUND TECHNIQUE: Gray-scale sonography with graded compression, as well as color Doppler and duplex ultrasound were performed to evaluate the lower extremity deep venous systems from the level of the common femoral vein and including the common femoral, femoral, profunda femoral, popliteal and calf veins including the posterior tibial, peroneal and gastrocnemius veins when visible. The superficial great saphenous vein was also interrogated. Spectral Doppler was utilized to evaluate flow at rest and with distal augmentation maneuvers in the common femoral, femoral and popliteal veins. COMPARISON:  None. FINDINGS: RIGHT LOWER EXTREMITY Common Femoral Vein: No evidence of thrombus. Normal compressibility, respiratory phasicity and response to augmentation. Saphenofemoral Junction: No evidence of thrombus. Normal compressibility and flow on color Doppler imaging. Profunda Femoral Vein: No evidence of thrombus. Normal compressibility and flow on color Doppler imaging. Femoral Vein: No evidence of thrombus. Normal compressibility, respiratory phasicity and response to augmentation. Popliteal Vein: No evidence of thrombus. Normal compressibility, respiratory phasicity and response to augmentation. Calf Veins: No evidence of thrombus. Normal compressibility and flow on color Doppler  imaging. Superficial Great Saphenous Vein: No evidence of thrombus. Normal compressibility and flow on color Doppler imaging. Venous Reflux:  None. Other Findings:  None. LEFT LOWER EXTREMITY Common Femoral Vein: No evidence of thrombus. Normal compressibility, respiratory phasicity and response to augmentation. Saphenofemoral Junction: No evidence of thrombus. Normal compressibility and flow on color Doppler imaging. Profunda Femoral Vein: No evidence of thrombus. Normal compressibility and flow on color Doppler imaging. Femoral Vein: No evidence of thrombus. Normal compressibility, respiratory phasicity and response to augmentation. Popliteal Vein: No evidence of thrombus. Normal compressibility, respiratory phasicity and response to augmentation. Calf Veins: No evidence of thrombus. Normal compressibility and flow on color Doppler imaging. Superficial Great Saphenous Vein: No evidence of thrombus. Normal compressibility and flow on color Doppler imaging. Venous Reflux:  None. Other Findings:  None. IMPRESSION: No evidence of DVT within either lower extremity. Electronically Signed   By: Sandi Mariscal M.D.   On: 08/08/2016 16:51     Assessment/Plan: 76 year old male with complaints of gait instability and diplopia.  Patient on anticoagulation.  MRI of the brain performed and shows no acute changes.  Symptoms somewhat progressive and from neurological exam are not characteristic of cerebrovascular disease.  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  Unclear if there may be some inner component as well.  Anxiety should also be considered.  Recommendations: 1.  Agree with continuation of anticoagulation 2.  Vestibular rehab 3.  ENT evaluation that may be performed as an outpatient  4.  B12, RPR, TSH, heavy metal screen, serum copper 5.  Patient to be seen on an outpatient basis by neurology with NCV/EMG to be performed   Alexis Goodell, MD Neurology [phone] 08/08/2016, 7:49 PM

## 2016-08-08 NOTE — Care Management (Addendum)
Provided patient with 30 day trial coupon for Xarelto as pulmonary embolus found on chest CT.  Patient to have [place] bilateral lower extremities.  Labs pending per neuro. Spoke with wife and daughter about home health services and agency  [facility] in network with patient's insurance.  PT and OT will reassess 7/18.  Discussed if continued to recommend skilled nursing and wish to pursue, his insurance would have to approve.

## 2016-08-08 NOTE — Progress Notes (Signed)
PE seen on CT Angio in LU lobe. MD Marcille Blanco made aware

## 2016-08-08 NOTE — Progress Notes (Signed)
Patient ID: Scott [name], male   DOB: 08-09-1940, 76 y.o.   MRN: [id number] PE seen on CTA. Pt already on Eliquis.

## 2016-08-08 NOTE — Progress Notes (Signed)
Physical Therapy Treatment Patient Details Name: Scott [name] MRN: [id number] DOB: 1940-01-25 Today's Date: 08/08/2016    History of Present Illness presented to ER secondary to worsening SOB, mid-sternal chest pain, attributed to anxiety attack at this point (additional medical work up negative for causative etiology).  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    PT Comments    Discussed newly diagnosed PE, anti coagulant medication plan and ordered tests for Bilateral lower extremity dopplers with pt's nurse. Agreed PT CI at this time. Stopped in pt room to notify pt/family of therapy hold. Pt/family have a number of questions regarding discharge planning and, which pt is best suited for as well as appropriate levels of activity and exercise if pt were to go home. Pt also asks clarification on skilled care versus home PT versus outpatient PT. Questions answered with as much clarification as able at this time. Pt/family understand there are still some unanswered questions at this time. Pt does have better understanding that returning to exact prior level of activity is not feasible or safe at this time and ultimately agrees that he would be at greater risk of further insult/injury. Pt/family have improved understanding of services that may be available post discharge for PT and other rehab specialties as needed; advised pt/family to speak with SW/CM for further resources.   Follow Up Recommendations  Other (comment) (Re assess next visit)     Equipment Recommendations       Recommendations for Other Services       Precautions / Restrictions      Mobility  Bed Mobility                  Transfers                    Ambulation/Gait             General Gait Details: CI   Stairs            Wheelchair Mobility    Modified  Rankin (Stroke Patients Only)       Balance                                            Cognition                                              Exercises Other Exercises Other Exercises: Pt/family has questions regarding appropriate discharge plan and activity level now as well as if/when pt goes home. Discussed safe exercise/activity versus over zealous activity and associated risks.     General Comments        Pertinent Vitals/Pain      Home Living                      Prior Function            PT Goals (current goals can now be found in the care plan section) Progress towards PT goals: PT to reassess next treatment    Frequency    Min 2X/week      PT Plan Other (comment) (Re assess next vist)  Co-evaluation              AM-PAC PT "6 Clicks" Daily Activity  Outcome Measure  Difficulty turning over in bed (including adjusting bedclothes, sheets and blankets)?: None Difficulty moving from lying on back to sitting on the side of the bed? : None Difficulty sitting down on and standing up from a chair with arms (e.g., wheelchair, bedside commode, etc,.)?: Total Help needed moving to and from a bed to chair (including a wheelchair)?: A Little Help needed walking in hospital room?: A Little Help needed climbing 3-5 steps with a railing? : A Little 6 Click Score: 18    End of Session     Patient left: in bed;with family/visitor present   PT Visit Diagnosis: Muscle weakness (generalized) (M62.81);Difficulty in walking, not elsewhere classified (R26.2)     Time: 2353-6144 PT Time Calculation (min) (ACUTE ONLY): 17 min  Charges:  $Therapeutic Exercise: 8-22 mins                    G Codes:        Larae Grooms, PTA 08/08/2016, 3:48 PM

## 2016-08-08 NOTE — Progress Notes (Signed)
[facility] [place] NAME: Scott [name]    MR#:  [id number]  DATE OF BIRTH:  1940/04/14  SUBJECTIVE:  CHIEF COMPLAINT:   Chief Complaint  Patient presents with  . Shortness of Breath  . Chest Pain   -Has chronic chest pain, recent sternal plate replacement in May 2018 after his CABG in 2016. -Occasional episodes of dyspnea and complains of weakness  REVIEW OF SYSTEMS:  Review of Systems  Constitutional: Positive for malaise/fatigue. Negative for chills and fever.  HENT: Negative for congestion, ear discharge, hearing loss and nosebleeds.   Eyes: Negative for blurred vision.       Resolved blurred vision today  Respiratory: Negative for cough, shortness of breath and wheezing.   Cardiovascular: Negative for chest pain, palpitations and leg swelling.  Gastrointestinal: Negative for abdominal pain, constipation, diarrhea, nausea and vomiting.  Genitourinary: Negative for dysuria.  Musculoskeletal: Negative for myalgias.  Neurological: Negative for dizziness, sensory change, speech change, focal weakness, seizures and headaches.       Gait changes  Psychiatric/Behavioral: Negative for depression.    DRUG ALLERGIES:   Allergies  Allergen Reactions  . Contrast Media [Iodinated Diagnostic Agents] Hives  . Oxycodone Hcl Itching  . Tramadol Itching  . Vicodin [Hydrocodone-Acetaminophen] Itching    VITALS:  Blood pressure 130/72, pulse 84, temperature 97.6 F (36.4 C), temperature source Oral, resp. rate (!) 28, height 5\' 10"  (1.778 m), weight 73.1 kg (161 lb 3.2 oz), SpO2 92 %.  PHYSICAL EXAMINATION:  Physical Exam  GENERAL:  76 y.o.-year-old patient lying in the bed with no acute distress.  EYES: Pupils equal, round, reactive to light and accommodation. No scleral icterus. Extraocular muscles intact.  HEENT: Head atraumatic, normocephalic. Oropharynx and nasopharynx clear.  NECK:  Supple, no jugular venous distention. No thyroid  enlargement, no tenderness.  LUNGS: Normal breath sounds bilaterally, no wheezing, rales,rhonchi or crepitation. No use of accessory muscles of respiration.  CARDIOVASCULAR: S1, S2 normal. No murmurs, rubs, or gallops.  ABDOMEN: Soft, nontender, nondistended. Bowel sounds present. No organomegaly or mass.  EXTREMITIES: No pedal edema, cyanosis, or clubbing.  NEUROLOGIC: Cranial nerves II through XII are intact. Muscle strength 5/5 in all extremities. Sensation intact. Gait not checked.  PSYCHIATRIC: The patient is alert and oriented x 3.  SKIN: No obvious rash, lesion, or ulcer.    LABORATORY PANEL:   CBC  Recent Labs Lab 08/08/16 0549  WBC 14.1*  HGB 13.0  HCT 40.2  PLT 273   ------------------------------------------------------------------------------------------------------------------  Chemistries   Recent Labs Lab 08/05/16 0333 08/08/16 0549  NA 139  --   K 3.9  --   CL 108  --   CO2 25  --   GLUCOSE 102*  --   BUN 15  --   CREATININE 1.09 1.10  CALCIUM 8.8*  --    ------------------------------------------------------------------------------------------------------------------  Cardiac Enzymes  Recent Labs Lab 08/05/16 0333  TROPONINI <0.03   ------------------------------------------------------------------------------------------------------------------  RADIOLOGY:  Ct Head Wo Contrast  Result Date: 08/07/2016 CLINICAL DATA:  Difficulty walking EXAM: CT HEAD WITHOUT CONTRAST TECHNIQUE: Contiguous axial images were obtained from the base of the skull through the vertex without intravenous contrast. COMPARISON:  None. FINDINGS: Brain: No evidence of acute infarction, hemorrhage, hydrocephalus, extra-axial collection or mass lesion/mass effect. Mild cortical atrophy. Mild subcortical white matter and periventricular small vessel ischemic changes. Vascular: No hyperdense vessel or unexpected calcification. Skull: Normal. Negative for fracture or focal lesion.  Sinuses/Orbits: The visualized paranasal sinuses  are essentially clear. The mastoid air cells are unopacified. Other: None. IMPRESSION: No evidence of acute intracranial abnormality. Mild atrophy with small vessel ischemic changes. Electronically Signed   By: Julian Hy M.D.   On: 08/07/2016 10:26   Ct Angio Chest Pe W Or Wo Contrast  Result Date: 08/08/2016 CLINICAL DATA:  76 year old male with shortness of breath. EXAM: CT ANGIOGRAPHY CHEST WITH CONTRAST TECHNIQUE: Multidetector CT imaging of the chest was performed using the standard protocol during bolus administration of intravenous contrast. Multiplanar CT image reconstructions and MIPs were obtained to evaluate the vascular anatomy. CONTRAST:  75 cc Isovue 370 COMPARISON:  Chest radiograph dated 08/04/2016 FINDINGS: Cardiovascular: There is no cardiomegaly or pericardial effusion. Advanced multi vessel coronary vascular calcification and CABG changes noted. The thoracic aorta is unremarkable for the degree of enhancement. There is pulmonary artery embolus extending from the left main pulmonary artery into the anterior left upper lobe segmental branch. The clot appears to be nearly completely occlusive. No other pulmonary artery embolus identified. There is no CT evidence of right heart straining. The right ventricle measures up to 3.9 cm and the left ventricle measures up to 5.1 cm in maximal diameter. Mediastinum/Nodes: There is no hilar or mediastinal adenopathy. The esophagus and the thyroid gland are grossly unremarkable. Lungs/Pleura: The lungs are clear. There is no pleural effusion or pneumothorax. The central airways are patent. Upper Abdomen: No acute abnormality. Musculoskeletal: There is mild degenerative changes of the spine. There has been interval placement of sternal fixation plate and screws. No definite fluid collection. No acute fracture. Review of the MIP images confirms the above findings. IMPRESSION: 1. Pulmonary artery embolus  involving the anterior left upper lobe segmental branch with extension into the left main pulmonary artery. No CT evidence of right heart straining. 2. Multi vessel coronary vascular calcification and CABG surgery changes. These results were called by telephone at the time of interpretation on 08/08/2016 at 4:47 am to Nurse Valere Dross who verbally acknowledged these results. Electronically Signed   By: Anner Crete M.D.   On: 08/08/2016 05:00   Mr Brain Wo Contrast  Result Date: 08/07/2016 CLINICAL DATA:  Cerebral infarction. EXAM: MRI HEAD WITHOUT CONTRAST TECHNIQUE: Multiplanar, multiecho pulse sequences of the brain and surrounding structures were obtained without intravenous contrast. COMPARISON:  CT head 08/07/2016 FINDINGS: Brain: Image quality degraded by mild motion Cerebral volume normal for age.  Negative for hydrocephalus. Negative for acute infarct. Scattered small white matter hyperintensities consistent with mild chronic microvascular ischemia. Negative for hemorrhage or mass. No shift of the midline structures. Vascular: Were normal arterial flow voids. Skull and upper cervical spine: Negative Sinuses/Orbits: Negative Other: None IMPRESSION: No acute intracranial abnormality. Mild chronic white matter microvascular ischemia Electronically Signed   By: Franchot Gallo M.D.   On: 08/07/2016 12:00    EKG:   Orders placed or performed during the hospital encounter of 08/04/16  . ED EKG within 10 minutes  . ED EKG within 10 minutes  . EKG 12-Lead  . EKG 12-Lead  . ED EKG  . ED EKG  . EKG 12-Lead  . 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  #1 dyspnea-CT of the  chest revealing left upper lobe PE extending into left main pulmonary artery. -no evidence of hypoxia. Already on eliquis- says he has been compliant with the medication. -Changed to Xarelto treatment dose. -Lower extremity Dopplers ordered.  #2 blurred vision and gait abnormality-MRI of the brain negative for any acute infarcts or acute neurological findings. -Blurred vision is intermittent and has resolved completely. Appreciate neurology consult - Oh findings of stroke, no blurred vision today. 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 -Continue his cardiac medications- on Coreg. Ramipril discontinued due to dry cough -Initiate cardiology consult.  #4 anxiety-appreciate psych consult. Started on Zoloft  #5 DVT prophylaxis-on Xarelto  Physical therapy recommended rehabilitation.   All the records are reviewed and case discussed with Care Management/Social Workerr. Management plans discussed with the patient, family and they are in agreement.  CODE STATUS: Full code  TOTAL TIME TAKING CARE OF THIS PATIENT: 34 minutes.   POSSIBLE D/C IN 1-2 DAYS, DEPENDING ON CLINICAL CONDITION.   Gladstone Lighter M.D on 08/08/2016 at 1:44 PM  Between 7am to 6pm - Pager - [phone]  After 6pm go to [email or web address] - password [email or web address] [facility]  Office  [phone]  CC: Primary care physician; Madelyn Brunner, MD

## 2016-08-08 NOTE — Progress Notes (Signed)
OT Cancellation Note  Patient Details Name: Scott [name] MRN: [id number] DOB: 01-11-1941   Cancelled Treatment:    Reason Eval/Treat Not Completed: Medical issues which prohibited therapy. PE found on CT angio early this morning. BLE venous doppler pending. Not appropriate for OT at this time. Will hold this date and re-attempt OT treatment next date, pending test results and appropriateness for therapy.  Jeni Salles, MPH, MS, OTR/L ascom [phone] 08/08/16, 3:08 PM

## 2016-08-08 NOTE — Progress Notes (Signed)
Patient had small event today prior to be taken down for u/s of his legs.  He appeared to be gasping for air and stated he couldn't breathe well.  Vital signs were stable.  Ativan given.  Patient seemed to become less agitated.  Able to leave floor for ultrasound.  No further events noted during shift.

## 2016-08-09 DIAGNOSIS — I2510 Atherosclerotic heart disease of native coronary artery without angina pectoris: Secondary | ICD-10-CM | POA: Diagnosis not present

## 2016-08-09 DIAGNOSIS — I2699 Other pulmonary embolism without acute cor pulmonale: Secondary | ICD-10-CM | POA: Diagnosis not present

## 2016-08-09 DIAGNOSIS — R69 Illness, unspecified: Secondary | ICD-10-CM | POA: Diagnosis not present

## 2016-08-09 DIAGNOSIS — R0602 Shortness of breath: Secondary | ICD-10-CM | POA: Diagnosis not present

## 2016-08-09 DIAGNOSIS — R262 Difficulty in walking, not elsewhere classified: Secondary | ICD-10-CM | POA: Diagnosis not present

## 2016-08-09 LAB — BASIC METABOLIC PANEL
Anion gap: 7 (ref 5–15)
BUN: 25 mg/dL — AB (ref 6–20)
CHLORIDE: 104 mmol/L (ref 101–111)
CO2: 26 mmol/L (ref 22–32)
CREATININE: 1.06 mg/dL (ref 0.61–1.24)
Calcium: 9 mg/dL (ref 8.9–10.3)
Glucose, Bld: 108 mg/dL — ABNORMAL HIGH (ref 65–99)
Potassium: 4.1 mmol/L (ref 3.5–5.1)
SODIUM: 137 mmol/L (ref 135–145)

## 2016-08-09 LAB — HEAVY METALS, BLOOD
ARSENIC: 8 ug/L (ref 2–23)
Lead: 2 ug/dL (ref 0–19)
MERCURY: NOT DETECTED ug/L (ref 0.0–14.9)

## 2016-08-09 LAB — PROTEIN S, TOTAL: Protein S Ag, Total: 82 % (ref 60–150)

## 2016-08-09 LAB — T4, FREE: FREE T4: 0.83 ng/dL (ref 0.61–1.12)

## 2016-08-09 LAB — PROTEIN S ACTIVITY: PROTEIN S ACTIVITY: 42 % — AB (ref 63–140)

## 2016-08-09 LAB — RPR: RPR: NONREACTIVE

## 2016-08-09 LAB — HOMOCYSTEINE: Homocysteine: 11.5 umol/L (ref 0.0–15.0)

## 2016-08-09 LAB — PROTEIN C ACTIVITY: Protein C Activity: 92 % (ref 73–180)

## 2016-08-09 MED ORDER — RIVAROXABAN (XARELTO) VTE STARTER PACK (15 & 20 MG): ORAL_TABLET | ORAL | 1 refills | Status: DC

## 2016-08-09 MED ORDER — SERTRALINE HCL 25 MG PO TABS: 25.0000 mg | ORAL_TABLET | Freq: Every day | ORAL | 1 refills | Status: DC

## 2016-08-09 NOTE — Progress Notes (Signed)
Patient alert and oriented, vss, no complaints of pain.  Rolling walker delivered and [facility] setup for patient.  Went over discharge paperwork and new Rx.  Wife present at bedside.  No questions at this time.  Patient escorted out of hospital via wheelchair by nursing staff.

## 2016-08-09 NOTE — Care Management (Signed)
Patient is to discharge home today with home health services with [facility] OT and Aide/SW if needed.  It had been originally recommended for patient to go to skilled nursing but he has improved.  Physical therapy has recommended rollator and this was obtained and delivered to patient room.  [facility] notified of discharge and of need to see patient 7/19.

## 2016-08-09 NOTE — Discharge Summary (Signed)
[facility] NAME: Scott [name]    MR#:  [id number]  DATE OF BIRTH:  07/03/1940  DATE OF ADMISSION:  08/04/2016   ADMITTING PHYSICIAN: Epifanio Lesches, MD  DATE OF DISCHARGE: 08/09/16  PRIMARY CARE PHYSICIAN: Madelyn Brunner, MD   ADMISSION DIAGNOSIS:   Shortness of breath [R06.02] Chest pain, unspecified type [R07.9] Chest pain [R07.9]  DISCHARGE DIAGNOSIS:   Principal Problem:   SOB (shortness of breath) Active Problems:   Chest pain with low risk for cardiac etiology   Coronary artery disease involving coronary bypass graft of native heart with angina pectoris (HCC)   PAF (paroxysmal atrial fibrillation) (HCC)   Anxiety   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 Arthritis    right hip, since a fall  . CAD (coronary artery disease)    a. 04/2012 Cath: 3VD->Med Rx;  b. 04/2013 PCI RCA (2 DES); c. 03/2014 PCI: LAD 80 (3.0x23 Xience [place] DES), 30 ISR in RCA; d. 06/2014 CABG x 2 ([facility]) LIMA->LAD, VG->OM; e. 11/2014 Neg MV;  f. 12/2014 Cath ([facility]): patent grafts->Med Rx; g. 03/2015 MV ([facility]) low risk, EF 40%; h. 05/2015 MV: low risk; i. 08/2015 Cath: patent grafts, patent RCA/LAD stents, small LCX/OM->Med Rx.  . Cardiomyopathy, ischemic    a. 04/2012 Echo: EF 40-45%;  b. 08/2013 Echo: EF 45-50%, mild glob HK, mod lat/post HK, diast dysfxn, mildly dil LA, mild MR, nl RVSP; c. 01/2015 Echo: EF 40-45%, Gr 1 DD, mildly dil LA.  . Carotid arterial disease ([place])    a. 05/2013 Carotid U/S; bilat 40-50% ICA stenosis.  . Chronic Chest Pain   . Chronic systolic CHF (congestive heart failure) ([place])    a. 01/2015 Echo: EF 40-45%.  . Cough   . Dyspnea    pt. unsure of exertion cause- that creates SOB at times   . Headache 1970's   migraine  . History of blood transfusion 2016   post op  . Hyperlipidemia   .  Hypertension   . Myocardial infarction [facility])    unsure of when    HOSPITAL COURSE:   76 year old male with past medical history significant for hypertension, CAD status post CABG in sternal plate replacement in May 2018, chronic chest pains, systolic CHF and ischemic cardiomyopathy, osteoarthritis, paroxysmal atrial fibrillation on eliquis for 2 years now, presents to the hospital secondary to worsening shortness of breath episodes and weakness.  #1 dyspnea-CT of the chest revealing left upper lobe PE extending into left main pulmonary artery. -no evidence of hypoxia. Already on eliquis- says he has been compliant with the medication. -Changed to Xarelto treatment dose. -Lower extremity Dopplers negative for DVT, hypercoagulable work up sent for.  #2 blurred vision and gait abnormality-MRI of the brain negative for any acute infarcts or acute neurological findings. -Blurred vision is intermittent and has resolved completely. Appreciate neurology consult - No findings of stroke, no blurred vision now. Ophthalmology evaluation as outpatient recommended. -Regarding gait-recommends outpatient ENT and neuro evaluation and ordered blood work for neuropathy/ myasthenia  #3 CAD status post CABG-also nonunion of sternum # status post recent sternotomy -Has chronic chest pains from the same. -Continue his cardiac medications- on Coreg. Ramipril discontinued due to dry cough -appreciate cardiology consult.  #4 anxiety-appreciate psych consult. on Zoloft and prn xanax  Discharge home  with home health today  DISCHARGE CONDITIONS:   Guarded  CONSULTS OBTAINED:   Treatment Team:  Lenward Chancellor, MD Alexis Goodell, MD  DRUG ALLERGIES:   Allergies  Allergen Reactions  . Contrast Media [Iodinated Diagnostic Agents] Hives  . Oxycodone Hcl Itching  . Tramadol Itching  . Vicodin [Hydrocodone-Acetaminophen] Itching   DISCHARGE MEDICATIONS:   Allergies as of 08/09/2016       Reactions   Contrast Media [iodinated Diagnostic Agents] Hives   Oxycodone Hcl Itching   Tramadol Itching   Vicodin [hydrocodone-acetaminophen] Itching      Medication List    STOP taking these medications   apixaban 5 MG Tabs tablet Commonly known as:  ELIQUIS   ramipril 2.5 MG capsule Commonly known as:  ALTACE     TAKE these medications   ALPRAZolam 0.5 MG tablet Commonly known as:  XANAX Take 1 tablet (0.5 mg total) by mouth 3 (three) times daily as needed for sleep or anxiety.   atorvastatin 80 MG tablet Commonly known as:  LIPITOR Take 80 mg by mouth at bedtime.   carvedilol 3.125 MG tablet Commonly known as:  COREG Take 1 tablet (3.125 mg total) by mouth daily before breakfast.   isosorbide mononitrate 60 MG 24 hr tablet Commonly known as:  IMDUR Take 60 mg by mouth daily before breakfast. Before breakfast   nitroGLYCERIN 0.4 MG SL tablet Commonly known as:  NITROSTAT Place 1 tablet (0.4 mg total) under the tongue every 5 (five) minutes as needed for chest pain.   Rivaroxaban 15 & 20 MG Tbpk Take as directed on package: Start with one 15mg  tablet by mouth twice a day with food. On Day 22, switch to one 20mg  tablet once a day with food.  Please refill with 20mg  qdaily xarelto after this month   sertraline 25 MG tablet Commonly known as:  ZOLOFT Take 1 tablet (25 mg total) by mouth daily.   traMADol 50 MG tablet Commonly known as:  ULTRAM Take 1-2 tablets (50-100 mg total) by mouth every 6 (six) hours as needed for moderate pain.   zolpidem 5 MG tablet Commonly known as:  AMBIEN Take 5 mg by mouth at bedtime as needed for sleep.        DISCHARGE INSTRUCTIONS:   1. PCP f/u in 1-2 weeks 2. Cardiology f/u in 2 weeks 3. If gait symptoms persist- neurology f/u in 2-3 weeks 4. Ophthalmology f/u in 2 weeks for diplopia 5. ENT f/u for gait abnormality in 2 weeks  DIET:   Cardiac diet  ACTIVITY:   Activity as tolerated  OXYGEN:   Home Oxygen: No.   Oxygen Delivery: room air  DISCHARGE LOCATION:   home   If you experience worsening of your admission symptoms, develop shortness of breath, life threatening emergency, suicidal or homicidal thoughts you must seek medical attention immediately by calling 911 or calling your MD immediately  if symptoms less severe.  You Must read complete instructions/literature along with all the possible adverse reactions/side effects for all the Medicines you take and that have been prescribed to you. Take any new Medicines after you have completely understood and accpet all the possible adverse reactions/side effects.   Please note  You were cared for by a hospitalist during your hospital stay. If you have any questions about your discharge medications or the care you received while you were in the hospital after you are discharged, you can call the unit and asked to speak with the hospitalist on call if  the hospitalist that took care of you is not available. Once you are discharged, your primary care physician will handle any further medical issues. Please note that NO REFILLS for any discharge medications will be authorized once you are discharged, as it is imperative that you return to your primary care physician (or establish a relationship with a primary care physician if you do not have one) for your aftercare needs so that they can reassess your need for medications and monitor your lab values.    On the day of Discharge:  VITAL SIGNS:   Blood pressure 117/74, pulse 74, temperature 98.1 F (36.7 C), temperature source Oral, resp. rate 18, height 5\' 10"  (1.778 m), weight 74.5 kg (164 lb 3.2 oz), SpO2 100 %.  PHYSICAL EXAMINATION:   GENERAL:  76 y.o.-year-old patient lying in the bed with no acute distress.  EYES: Pupils equal, round, reactive to light and accommodation. No scleral icterus. Extraocular muscles intact.  HEENT: Head atraumatic, normocephalic. Oropharynx and nasopharynx clear.    NECK:  Supple, no jugular venous distention. No thyroid enlargement, no tenderness.  LUNGS: Normal breath sounds bilaterally, no wheezing, rales,rhonchi or crepitation. No use of accessory muscles of respiration.  CARDIOVASCULAR: S1, S2 normal. No murmurs, rubs, or gallops.  ABDOMEN: Soft, nontender, nondistended. Bowel sounds present. No organomegaly or mass.  EXTREMITIES: No pedal edema, cyanosis, or clubbing.  NEUROLOGIC: Cranial nerves II through XII are intact. Muscle strength 5/5 in all extremities. Sensation intact. Gait not checked.  PSYCHIATRIC: The patient is alert and oriented x 3.  SKIN: No obvious rash, lesion, or ulcer.   DATA REVIEW:   CBC  Recent Labs Lab 08/08/16 0549  WBC 14.1*  HGB 13.0  HCT 40.2  PLT 273    Chemistries   Recent Labs Lab 08/09/16 0824  NA 137  K 4.1  CL 104  CO2 26  GLUCOSE 108*  BUN 25*  CREATININE 1.06  CALCIUM 9.0     Microbiology Results  Results for orders placed or performed during the hospital encounter of 05/25/16  Aerobic/Anaerobic Culture (surgical/deep wound)     Status: None   Collection Time: 05/25/16  9:44 AM  Result Value Ref Range Status   Specimen Description BONE MARROW  Final   Special Requests STERNAL  Final   Gram Stain NO WBC SEEN NO ORGANISMS SEEN   Final   Culture No growth aerobically or anaerobically.  Final   Report Status 05/30/2016 FINAL  Final  Acid Fast Culture with reflexed sensitivities     Status: None   Collection Time: 05/25/16  9:48 AM  Result Value Ref Range Status   Acid Fast Culture Negative  Final    Comment: (NOTE) No acid fast bacilli isolated after 6 weeks. Performed At: [facility] [place], [place] [id number] Lindon Romp MD [id number]    Source of Sample TISSUE  Final    Comment: STERNAL PATIENT ON FOLLOWING ZINACEF   Acid Fast Smear (AFB)     Status: None   Collection Time: 05/25/16  9:48 AM  Result Value Ref Range Status   AFB  Specimen Processing Comment  Final    Comment: Tissue Grinding and Digestion/Decontamination   Acid Fast Smear Negative  Final    Comment: (NOTE) Performed At: [facility] [place], [place] [id number] Lindon Romp MD [id number]    Source (AFB) TISSUE  Final    Comment: STERNAL PATIENT ON FOLLOWING ZINACEF   Fungus Culture With Stain  Status: None   Collection Time: 05/25/16  9:48 AM  Result Value Ref Range Status   Fungus Stain Final report  Final   Fungus (Mycology) Culture Final report  Final    Comment: (NOTE) Performed At: [facility] [place], [place] [id number] Lindon Romp MD YT:[id number]    Fungal Source TISSUE  Final    Comment: STERNAL PATIENT ON FOLLOWING ZINACEF   Fungus Culture Result     Status: None   Collection Time: 05/25/16  9:48 AM  Result Value Ref Range Status   Result 1 Comment  Final    Comment: (NOTE) KOH/Calcofluor preparation:  no fungus observed. Performed At: [facility] [place], [place] [id number] Lindon Romp MD [id number]   Fungal organism reflex     Status: None   Collection Time: 05/25/16  9:48 AM  Result Value Ref Range Status   Fungal result 1 Comment  Final    Comment: (NOTE) No yeast or mold isolated after 4 weeks. Performed At: [facility] [place], [place] [id number] Lindon Romp MD SV:[id number]   Acid Fast Culture with reflexed sensitivities     Status: None   Collection Time: 05/25/16  9:54 AM  Result Value Ref Range Status   Acid Fast Culture Negative  Final    Comment: (NOTE) No acid fast bacilli isolated after 6 weeks. Performed At: [facility] [place], [place] [id number] Lindon Romp MD [id number]    Source of Sample BONE MARROW  Final  Acid Fast Smear (AFB)     Status: None   Collection Time: 05/25/16  9:54 AM  Result Value Ref Range Status   AFB Specimen  Processing Comment  Final    Comment: Tissue Grinding and Digestion/Decontamination   Acid Fast Smear Negative  Final    Comment: (NOTE) Performed At: [facility] [place], [place] [id number] Lindon Romp MD GO:[id number]    Source (AFB) STERNAL  Final    RADIOLOGY:  US Venous Img Lower Bilateral  Result Date: 08/08/2016 CLINICAL DATA:  Bilateral lower extremity edema. History of pulmonary embolism. Former smoker. Evaluate for DVT. EXAM: BILATERAL LOWER EXTREMITY VENOUS DOPPLER ULTRASOUND TECHNIQUE: Gray-scale sonography with graded compression, as well as color Doppler and duplex ultrasound were performed to evaluate the lower extremity deep venous systems from the level of the common femoral vein and including the common femoral, femoral, profunda femoral, popliteal and calf veins including the posterior tibial, peroneal and gastrocnemius veins when visible. The superficial great saphenous vein was also interrogated. Spectral Doppler was utilized to evaluate flow at rest and with distal augmentation maneuvers in the common femoral, femoral and popliteal veins. COMPARISON:  None. FINDINGS: RIGHT LOWER EXTREMITY Common Femoral Vein: No evidence of thrombus. Normal compressibility, respiratory phasicity and response to augmentation. Saphenofemoral Junction: No evidence of thrombus. Normal compressibility and flow on color Doppler imaging. Profunda Femoral Vein: No evidence of thrombus. Normal compressibility and flow on color Doppler imaging. Femoral Vein: No evidence of thrombus. Normal compressibility, respiratory phasicity and response to augmentation. Popliteal Vein: No evidence of thrombus. Normal compressibility, respiratory phasicity and response to augmentation. Calf Veins: No evidence of thrombus. Normal compressibility and flow on color Doppler imaging. Superficial Great Saphenous Vein: No evidence of thrombus. Normal compressibility and flow on color Doppler  imaging. Venous Reflux:  None. Other Findings:  None. LEFT LOWER EXTREMITY Common Femoral Vein: No evidence of thrombus. Normal compressibility, respiratory phasicity and  response to augmentation. Saphenofemoral Junction: No evidence of thrombus. Normal compressibility and flow on color Doppler imaging. Profunda Femoral Vein: No evidence of thrombus. Normal compressibility and flow on color Doppler imaging. Femoral Vein: No evidence of thrombus. Normal compressibility, respiratory phasicity and response to augmentation. Popliteal Vein: No evidence of thrombus. Normal compressibility, respiratory phasicity and response to augmentation. Calf Veins: No evidence of thrombus. Normal compressibility and flow on color Doppler imaging. Superficial Great Saphenous Vein: No evidence of thrombus. Normal compressibility and flow on color Doppler imaging. Venous Reflux:  None. Other Findings:  None. IMPRESSION: No evidence of DVT within either lower extremity. Electronically Signed   By: Sandi Mariscal M.D.   On: 08/08/2016 16:51     Management plans discussed with the patient, family and they are in agreement.  CODE STATUS:     Code Status Orders        Start     Ordered   08/04/16 1531  Full code  Continuous     08/04/16 1535    Code Status History    Date Active Date Inactive Code Status Order ID Comments User Context   05/25/2016  2:22 PM 05/26/2016  5:12 PM Full Code [id number]  Nani Skillern, PA-C Inpatient   02/08/2015  1:07 PM 02/09/2015  6:43 PM Full Code [id number]  Epifanio Lesches, MD ED   12/07/2014  2:23 PM 12/08/2014  6:52 PM Full Code [id number]  Fritzi Mandes, MD Inpatient    Advance Directive Documentation     Most Recent Value  Type of Advance Directive  Healthcare Power of Attorney  Pre-existing out of facility DNR order (yellow form or pink MOST form)  -  "MOST" Form in Place?  -      TOTAL TIME TAKING CARE OF THIS PATIENT: 42 minutes.    Gladstone Lighter M.D on 08/09/2016 at  9:49 AM  Between 7am to 6pm - Pager - [phone]  After 6pm go to [email or web address] - Proofreader  [facility]  Office  [phone]  CC: Primary care physician; Madelyn Brunner, MD   Note: This dictation was prepared with Dragon dictation along with smaller phrase technology. Any transcriptional errors that result from this process are unintentional.

## 2016-08-09 NOTE — Progress Notes (Signed)
Occupational Therapy Treatment Patient Details Name: Scott [name] MRN: [id number] DOB: 1940/07/28 Today's Date: 08/09/2016    History of present illness presented to ER secondary to worsening SOB, mid-sternal chest pain, attributed to anxiety attack at this point (additional medical work up negative for causative etiology).  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   OT comments  Pt seen for OT treatment this date. Approx 15 minute co-tx session with PTA to support pt/caregiver education and problem solving for pt's symptoms and strategies to support gradual return to activity to minimize risk of pain and panic attacks. Pt educated in work simplification, energy conservation, cognitive behavioral strategies for pain mgt and for controlling/minimizing SOB including pleasant imagery, relaxation, and pursed lip breathing. ECS handout provided. Facilitated use of pursed lip breathing while utilizing a pulse oximeter to support pt in understanding vital signs and impact of PLB. Pt reports he has one at home. Pt will benefit from home health therapy services including PT and OT to support pt in continuing to address goals in order to maximize return to PLOF and minimize risk of falls/injury/rehospitalization.    Follow Up Recommendations  Home health OT;Supervision - Intermittent    Equipment Recommendations  None recommended by OT    Recommendations for Other Services      Precautions / Restrictions Precautions Precautions: Fall Restrictions Weight Bearing Restrictions: No       Mobility Bed Mobility Overal bed mobility: Independent Bed Mobility: Supine to Sit;Sit to Supine     Supine to sit: Independent Sit to supine: Independent   General bed mobility comments: no LOB noted, good confidence  Transfers Overall transfer level: Needs  assistance Equipment used: Rolling walker (2 wheeled) Transfers: Sit to/from Stand Sit to Stand: Min guard              Balance Overall balance assessment: Needs assistance Sitting-balance support: No upper extremity supported;Feet supported Sitting balance-Leahy Scale: Fair Sitting balance - Comments: continued to have a couple random slight LOB while in sitting when he experienced sharp pain, requiring close supervision/Min guard                                    ADL either performed or assessed with clinical judgement   ADL Overall ADL's : Needs assistance/impaired                                       General ADL Comments: generally at supervision level for seated ADL tasks and PRN min assist for instability during functional mobility      Vision Baseline Vision/History: Wears glasses Patient Visual Report: No change from baseline (diplopia resolved)     Perception     Praxis      Cognition Arousal/Alertness: Awake/alert Behavior During Therapy: WFL for tasks assessed/performed Overall Cognitive Status: Within Functional Limits for tasks assessed                                          Exercises Other Exercises Other Exercises: pt/spouse educated in energy conservation strategies and work simplifications strategies to support gradual return to activity at home to minimize  risk of overdoing, injury, pain; verbalized understanding and handout provided Other Exercises: pt/spouse educated in pursed lip breathing and cognitive behavioral strategies for pain mgt and minimizing SOB; pt able to demonstrate understanding   Shoulder Instructions       General Comments      Pertinent Vitals/ Pain       Pain Assessment: 0-10 Pain Score: 4  Pain Location: occasional sharp pain shooting pain in R shoulder and sternum Pain Descriptors / Indicators: Sharp;Shooting Pain Intervention(s): Limited activity within patient's  tolerance;Monitored during session;Repositioned;Utilized relaxation techniques;Relaxation  Home Living                                          Prior Functioning/Environment              Frequency  Min 2X/week        Progress Toward Goals  OT Goals(current goals can now be found in the care plan section)  Progress towards OT goals: Progressing toward goals  Acute Rehab OT Goals Patient Stated Goal: to be safe to go home OT Goal Formulation: With patient/family Time For Goal Achievement: 08/21/16 Potential to Achieve Goals: Good  Plan Discharge plan needs to be updated;Frequency remains appropriate    Co-evaluation    PT/OT/SLP Co-Evaluation/Treatment: Yes Reason for Co-Treatment: Necessary to address cognition/behavior during functional activity PT goals addressed during session: Mobility/safety with mobility;Strengthening/ROM OT goals addressed during session: ADL's and self-care;Strengthening/ROM      AM-PAC PT "6 Clicks" Daily Activity     Outcome Measure   Help from another person eating meals?: None Help from another person taking care of personal grooming?: None Help from another person toileting, which includes using toliet, bedpan, or urinal?: A Little Help from another person bathing (including washing, rinsing, drying)?: A Little Help from another person to put on and taking off regular upper body clothing?: None Help from another person to put on and taking off regular lower body clothing?: A Little 6 Click Score: 21    End of Session    OT Visit Diagnosis: Other abnormalities of gait and mobility (R26.89)   Activity Tolerance Patient tolerated treatment well   Patient Left in bed;with call bell/phone within reach;with family/visitor present   Nurse Communication      Functional Assessment Tool Used: AM-PAC 6 Clicks Daily Activity;Clinical judgement Functional Limitation: Self care Self Care Current Status (Y4825): At least  20 percent but less than 40 percent impaired, limited or restricted Self Care Goal Status (O0370): At least 20 percent but less than 40 percent impaired, limited or restricted   Time: 4888-9169 OT Time Calculation (min): 56 min  Charges: OT G-codes **NOT FOR INPATIENT CLASS** Functional Assessment Tool Used: AM-PAC 6 Clicks Daily Activity;Clinical judgement Functional Limitation: Self care Self Care Current Status (I5038): At least 20 percent but less than 40 percent impaired, limited or restricted Self Care Goal Status (U8280): At least 20 percent but less than 40 percent impaired, limited or restricted OT General Charges $OT Visit: 1 Procedure OT Treatments $Therapeutic Activity: 38-52 mins  Jeni Salles, MPH, MS, OTR/L ascom [phone] 08/09/16, 12:15 PM

## 2016-08-09 NOTE — Progress Notes (Signed)
Physical Therapy Treatment Patient Details Name: Scott [name] MRN: [id number] DOB: March 17, 1940 Today's Date: 08/09/2016    History of Present Illness presented to ER secondary to worsening SOB, mid-sternal chest pain, attributed to anxiety attack at this point (additional medical work up negative for causative etiology).  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    PT Comments    Pt agreeable to PT; initially without pain. Pt progressing well, ambulating well with rolling walker; although pt does not like the inability for rolling walker to maneuver smoothly. No balance issues noted and pt without pain or difficulty breathing episodes. Heart rate monitored to be in the 70's with ambulation. 250 feet performed on 2 separate runs with rest and exercise/activity management education in between. Several minutes post second walk, pt experiences pain twinges in chest/sternal area that does not radiate up neck/into arms. Over several minutes pain intensifies with coming/going sharp pains and increased respiratory distress (taking his breath away) resemblance of a panic attack. Extensive time spent with pt in co treat with OT for relaxation techniques, breathing techniques and further inquiry and education into pt activity and education on conservative approach to progression. Education regarding muscle overload, restriction, scar restrictions and positioning/stretching techniques for improved flexibility of tissues. Post OT treatment returned to pt's room for education/practice on use of a rollator, which will serve pt better for home activity/work. Discussed treatment session with MD and CM. Pt to discharge home today with home health PT to assist pt in safe practice with activity and management of safe progression of activity.    Follow Up Recommendations  Other (comment)      Equipment Recommendations  Other (comment) (Rollator)    Recommendations for Other Services       Precautions / Restrictions Precautions Precautions: Fall Restrictions Weight Bearing Restrictions: No    Mobility  Bed Mobility Overal bed mobility: Independent Bed Mobility: Supine to Sit;Sit to Supine     Supine to sit: Independent Sit to supine: Independent   General bed mobility comments: no LOB noted, good confidence  Transfers Overall transfer level: Modified independent Equipment used: Rolling walker (2 wheeled);4-wheeled walker Transfers: Sit to/from Stand Sit to Stand: Supervision;Modified independent (Device/Increase time)         General transfer comment: Cues for hand placement; instruction on use of rw; improved technique throughout multiple trials in extensive session  Ambulation/Gait Ambulation/Gait assistance: Supervision;Modified independent (Device/Increase time) Ambulation Distance (Feet): 250 Feet (performed 2x with rw and 1x with rollatore; rests in between) Assistive device: Rolling walker (2 wheeled);4-wheeled walker Gait Pattern/deviations: WFL(Within Functional Limits) (decreased speed from baseline) Gait velocity: reduced Gait velocity interpretation: Below normal speed for age/gender General Gait Details: Pt feels awkward with rw; improved with rollator due to greater mobility in AD   Stairs            Wheelchair Mobility    Modified Rankin (Stroke Patients Only)       Balance Overall balance assessment: Modified Independent Sitting-balance support: No upper extremity supported;Feet supported Sitting balance-Leahy Scale: Fair Sitting balance - Comments: continued to have a couple random slight LOB while in sitting when he experienced sharp pain, requiring close supervision/Min guard                                     Cognition Arousal/Alertness:  Awake/alert Behavior During Therapy: WFL for tasks  assessed/performed Overall Cognitive Status: Within Functional Limits for tasks assessed                                 General Comments: 1 episode resemblance of a panic attack      Exercises Other Exercises Other Exercises: Extensive education on E conservation techniqes, conservative management and progression of activity/exercises.  Other Exercises: Education/suggestions on safety measures in the home with functional activities (use of grab bars/tub bench in bathroom, seated activities in barn/work space) Other Exercises: Relaxation breathing techniqes and savasana pose Other Exercises: Education on work load on muscle and signs/symptoms of overwork. Stretching for pec/biceps muscles    General Comments        Pertinent Vitals/Pain Pain Assessment: No/denies pain (Initially denies. 1 episode nonradiating chest/sternal pain) Pain Score: 4  Pain Location: occasional sharp pain shooting pain in R shoulder and sternum Pain Descriptors / Indicators: Sharp;Shooting Pain Intervention(s): Relaxation;Other (comment) (breathing techniques; savasana pose)    Home Living                      Prior Function            PT Goals (current goals can now be found in the care plan section) Acute Rehab PT Goals Patient Stated Goal: to be safe to go home Progress towards PT goals: Progressing toward goals    Frequency    Min 2X/week      PT Plan Current plan remains appropriate    Co-evaluation   Reason for Co-Treatment: Necessary to address cognition/behavior during functional activity PT goals addressed during session: Mobility/safety with mobility;Strengthening/ROM OT goals addressed during session: ADL's and self-care;Strengthening/ROM      AM-PAC PT "6 Clicks" Daily Activity  Outcome Measure  Difficulty turning over in bed (including adjusting bedclothes, sheets and blankets)?: None Difficulty moving from lying on back to sitting on the side of  the bed? : None Difficulty sitting down on and standing up from a chair with arms (e.g., wheelchair, bedside commode, etc,.)?: None Help needed moving to and from a bed to chair (including a wheelchair)?: A Little Help needed walking in hospital room?: A Little Help needed climbing 3-5 steps with a railing? : A Little 6 Click Score: 21    End of Session Equipment Utilized During Treatment: Gait belt Activity Tolerance: Patient tolerated treatment well Patient left: in bed;with call bell/phone within reach;with family/visitor present Nurse Communication: Mobility status;Other (comment) (Discussions with MD, nurse, CM regarding session) PT Visit Diagnosis: Muscle weakness (generalized) (M62.81);Difficulty in walking, not elsewhere classified (R26.2)     Time: 8676-1950 (addition 12 min 1145 to 1157) PT Time Calculation (min) (ACUTE ONLY): 72 min  Charges:  $Gait Training: 38-52 mins $Therapeutic Exercise: 23-37 mins (1 unit co treat with OT)                    G Codes:        Larae Grooms, PTA 08/09/2016, 12:47 PM

## 2016-08-09 NOTE — Clinical Social Work Note (Signed)
CSW received referral for SNF.  Case discussed with case manager and plan is to discharge home with home health.  CSW to sign off please re-consult if social work needs arise.  Labrina Lines R. Kima Malenfant, MSW, LCSWA [phone]  

## 2016-08-10 DIAGNOSIS — I25709 Atherosclerosis of coronary artery bypass graft(s), unspecified, with unspecified angina pectoris: Secondary | ICD-10-CM | POA: Diagnosis not present

## 2016-08-10 DIAGNOSIS — I110 Hypertensive heart disease with heart failure: Secondary | ICD-10-CM | POA: Diagnosis not present

## 2016-08-10 DIAGNOSIS — Z87891 Personal history of nicotine dependence: Secondary | ICD-10-CM | POA: Diagnosis not present

## 2016-08-10 DIAGNOSIS — I5022 Chronic systolic (congestive) heart failure: Secondary | ICD-10-CM | POA: Diagnosis not present

## 2016-08-10 DIAGNOSIS — T8132XD Disruption of internal operation (surgical) wound, not elsewhere classified, subsequent encounter: Secondary | ICD-10-CM | POA: Diagnosis not present

## 2016-08-10 DIAGNOSIS — Z7901 Long term (current) use of anticoagulants: Secondary | ICD-10-CM | POA: Diagnosis not present

## 2016-08-10 DIAGNOSIS — Z951 Presence of aortocoronary bypass graft: Secondary | ICD-10-CM | POA: Diagnosis not present

## 2016-08-10 DIAGNOSIS — R69 Illness, unspecified: Secondary | ICD-10-CM | POA: Diagnosis not present

## 2016-08-10 DIAGNOSIS — I480 Paroxysmal atrial fibrillation: Secondary | ICD-10-CM | POA: Diagnosis not present

## 2016-08-10 LAB — CARDIOLIPIN ANTIBODIES, IGG, IGM, IGA
Anticardiolipin IgA: <9 APL U/mL (ref 0–11)
Anticardiolipin IgG: <9 GPL U/mL (ref 0–14)
Anticardiolipin IgM: <9 MPL U/mL (ref 0–12)

## 2016-08-10 LAB — BETA-2-GLYCOPROTEIN I ABS, IGG/M/A
Beta-2-Glycoprotein I IgA: <9 GPI IgA units (ref 0–25)
Beta-2-Glycoprotein I IgM: <9 GPI IgM units (ref 0–32)

## 2016-08-10 LAB — PROTEIN C, TOTAL: PROTEIN C, TOTAL: 76 % (ref 60–150)

## 2016-08-11 ENCOUNTER — Telehealth: Payer: Self-pay | Admitting: Cardiovascular Disease

## 2016-08-11 ENCOUNTER — Encounter: Payer: Self-pay | Admitting: Cardiovascular Disease

## 2016-08-11 ENCOUNTER — Telehealth: Payer: Self-pay | Admitting: *Deleted

## 2016-08-11 LAB — COPPER, SERUM: COPPER: 107 ug/dL (ref 72–166)

## 2016-08-11 LAB — LUPUS ANTICOAGULANT PANEL
DRVVT: 46.5 s (ref 0.0–47.0)
PTT Lupus Anticoagulant: 32.8 s (ref 0.0–51.9)

## 2016-08-11 NOTE — Telephone Encounter (Signed)
This encounter was created in error - please disregard.

## 2016-08-11 NOTE — Telephone Encounter (Signed)
Patient contacted regarding discharge from [facility] on 08/09/16.  Patient states that he was told this pain was not heart related and canceled follow up appointment with [place]. He stated that he would just keep his regular follow up in November. Advised him to give [place] a call back if he should have any needs before then. He verbalized understanding with no further questions at this time.

## 2016-08-11 NOTE — Telephone Encounter (Signed)
7/24 4:20  Dr. Rockey Situ   Patient contacted regarding discharge from [facility] on 08/09/16. He is not home at this time but wife (on [place]) asks I review information with her.   Patient understands to follow up with provider Dr. Rockey Situ on 7/24 at 4:20pm at [facility] [place]. Patient understands discharge instructions? yes Patient understands medications and regiment? yes Patient understands to bring all medications to this visit? yes

## 2016-08-11 NOTE — Telephone Encounter (Signed)
-----   Message from Minna Merritts, MD sent at 08/10/2016 10:43 PM EDT ----- Regarding: RE: tmc/ph He has one with dr. Lisette Grinder, pmd thx TG  ----- Message ----- From: Stana Bunting, RN Sent: 08/09/2016   9:48 AM To: Minna Merritts, MD Subject: Melton Alar: tmc/ph                                     Does he need a TCM f/u appt?  ----- Message ----- From: Blain Pais Sent: 08/09/2016   9:39 AM To: Rebeca Alert Burl Triage Subject: tmc/ph                                         7/24 4:20 Dr. Rockey Situ

## 2016-08-12 LAB — FACTOR 5 LEIDEN

## 2016-08-14 ENCOUNTER — Telehealth: Payer: Self-pay | Admitting: Cardiovascular Disease

## 2016-08-14 DIAGNOSIS — T8132XD Disruption of internal operation (surgical) wound, not elsewhere classified, subsequent encounter: Secondary | ICD-10-CM | POA: Diagnosis not present

## 2016-08-14 DIAGNOSIS — Z7901 Long term (current) use of anticoagulants: Secondary | ICD-10-CM | POA: Diagnosis not present

## 2016-08-14 DIAGNOSIS — Z87891 Personal history of nicotine dependence: Secondary | ICD-10-CM | POA: Diagnosis not present

## 2016-08-14 DIAGNOSIS — I25709 Atherosclerosis of coronary artery bypass graft(s), unspecified, with unspecified angina pectoris: Secondary | ICD-10-CM | POA: Diagnosis not present

## 2016-08-14 DIAGNOSIS — I5022 Chronic systolic (congestive) heart failure: Secondary | ICD-10-CM | POA: Diagnosis not present

## 2016-08-14 DIAGNOSIS — R69 Illness, unspecified: Secondary | ICD-10-CM | POA: Diagnosis not present

## 2016-08-14 DIAGNOSIS — I110 Hypertensive heart disease with heart failure: Secondary | ICD-10-CM | POA: Diagnosis not present

## 2016-08-14 DIAGNOSIS — Z951 Presence of aortocoronary bypass graft: Secondary | ICD-10-CM | POA: Diagnosis not present

## 2016-08-14 DIAGNOSIS — I480 Paroxysmal atrial fibrillation: Secondary | ICD-10-CM | POA: Diagnosis not present

## 2016-08-14 LAB — PROTHROMBIN GENE MUTATION

## 2016-08-14 NOTE — Telephone Encounter (Signed)
[name] with [facility] met patient for the first time today during the visit he complained of dizziness and SOB He had symptoms were showing signs of Bradycardia  Within an hour his HR went from 62-44-57  Would like some advise on this Please call back

## 2016-08-14 NOTE — Telephone Encounter (Signed)
Pt had appt sched to see you for hospital f/u, but he cancelled, as his sx were not cardiac related. [facility] nurse is concerned about his HR and wanted to see if you want to adjust his meds. He currently takes carvedilol 3.125 mg QAM.

## 2016-08-15 ENCOUNTER — Ambulatory Visit: Payer: Medicare HMO | Admitting: Cardiovascular Disease

## 2016-08-15 DIAGNOSIS — I110 Hypertensive heart disease with heart failure: Secondary | ICD-10-CM | POA: Diagnosis not present

## 2016-08-15 DIAGNOSIS — I480 Paroxysmal atrial fibrillation: Secondary | ICD-10-CM | POA: Diagnosis not present

## 2016-08-15 DIAGNOSIS — I5022 Chronic systolic (congestive) heart failure: Secondary | ICD-10-CM | POA: Diagnosis not present

## 2016-08-15 DIAGNOSIS — Z87891 Personal history of nicotine dependence: Secondary | ICD-10-CM | POA: Diagnosis not present

## 2016-08-15 DIAGNOSIS — Z951 Presence of aortocoronary bypass graft: Secondary | ICD-10-CM | POA: Diagnosis not present

## 2016-08-15 DIAGNOSIS — T8132XD Disruption of internal operation (surgical) wound, not elsewhere classified, subsequent encounter: Secondary | ICD-10-CM | POA: Diagnosis not present

## 2016-08-15 DIAGNOSIS — I25709 Atherosclerosis of coronary artery bypass graft(s), unspecified, with unspecified angina pectoris: Secondary | ICD-10-CM | POA: Diagnosis not present

## 2016-08-15 DIAGNOSIS — R69 Illness, unspecified: Secondary | ICD-10-CM | POA: Diagnosis not present

## 2016-08-15 DIAGNOSIS — Z7901 Long term (current) use of anticoagulants: Secondary | ICD-10-CM | POA: Diagnosis not present

## 2016-08-15 NOTE — Telephone Encounter (Signed)
Would check blood pressure, heart rate at one time Then check orthostatics with blood pressure and heart rate with standing That will help [place] manage his medications

## 2016-08-15 NOTE — Telephone Encounter (Signed)
Spoke w/ Clarise Cruz.  She reports that pt was quite adamant that his sx are not r/t anxiety.  She encouraged him to utilize his xanax prn instead of QHS.  She will call back w/ readings after her next home visit w/ him.

## 2016-08-15 NOTE — Telephone Encounter (Signed)
Left message for Scott [name] w/ Dr. Donivan Scull recommendations.  Asked her to call back w/ BP & HR readings.

## 2016-08-16 DIAGNOSIS — D6859 Other primary thrombophilia: Secondary | ICD-10-CM | POA: Insufficient documentation

## 2016-08-16 DIAGNOSIS — R001 Bradycardia, unspecified: Secondary | ICD-10-CM | POA: Diagnosis not present

## 2016-08-16 DIAGNOSIS — Z86711 Personal history of pulmonary embolism: Secondary | ICD-10-CM | POA: Insufficient documentation

## 2016-08-17 ENCOUNTER — Ambulatory Visit: Payer: Medicare HMO | Admitting: Cardiovascular Disease

## 2016-08-17 DIAGNOSIS — I480 Paroxysmal atrial fibrillation: Secondary | ICD-10-CM | POA: Diagnosis not present

## 2016-08-17 DIAGNOSIS — I25709 Atherosclerosis of coronary artery bypass graft(s), unspecified, with unspecified angina pectoris: Secondary | ICD-10-CM | POA: Diagnosis not present

## 2016-08-17 DIAGNOSIS — I110 Hypertensive heart disease with heart failure: Secondary | ICD-10-CM | POA: Diagnosis not present

## 2016-08-17 DIAGNOSIS — I5022 Chronic systolic (congestive) heart failure: Secondary | ICD-10-CM | POA: Diagnosis not present

## 2016-08-17 DIAGNOSIS — Z951 Presence of aortocoronary bypass graft: Secondary | ICD-10-CM | POA: Diagnosis not present

## 2016-08-17 DIAGNOSIS — R69 Illness, unspecified: Secondary | ICD-10-CM | POA: Diagnosis not present

## 2016-08-17 DIAGNOSIS — Z7901 Long term (current) use of anticoagulants: Secondary | ICD-10-CM | POA: Diagnosis not present

## 2016-08-17 DIAGNOSIS — Z87891 Personal history of nicotine dependence: Secondary | ICD-10-CM | POA: Diagnosis not present

## 2016-08-17 DIAGNOSIS — T8132XD Disruption of internal operation (surgical) wound, not elsewhere classified, subsequent encounter: Secondary | ICD-10-CM | POA: Diagnosis not present

## 2016-08-17 NOTE — Progress Notes (Deleted)
Cardiology Office Note  Date:  08/17/2016   ID:  Scott [name], DOB 06-11-1940, MRN [id number]  PCP:  Madelyn Brunner, MD   No chief complaint on file.   HPI:  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 lobe segmental branch with extension into left pain pulmonary artery  No evidence of DVT in his legs on ultrasound  Cardiac catheterization April 2014 Cardiac catheterization April 2015 with stent to the RCA Cardiac catheterization with stent placement March 2016, DES to the mid LAD Cath at [place] 05/28/2014, 50% LAD lesion, distal RCA disease Notes indicating bypass surgery June 2016 at [place] test in November 2016 for chronic chest pain showing no ischemia cardiac catheterization December 2016 at [facility] Multiple  hospital admissions with chronic chest pain, neg w/u  Referred from pain clinic in [place] to [place] surg for consult Bone scan:diffuse increased uptake in the manubrium and upper sternum sternal wire removal on 03/30/2016. Redo median sternotomy for sternal plating by Dr. Roxan Hockey on 05/25/2016. 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 EF 40 to 45%  02/08/2015  In follow-up today reports that he is doing well, walking 1 mile per day without significant chest pain He's not requiring tramadol  Previously referred to the pain clinic.  previously prescribed tramadol chest  discomfort despite titrating doses of isosorbide and ranexa. 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 He was initially medically managed.   In April 2015, he underwent repeat catheterization secondary to ongoing chest pain, showing stable moderate to severe disease. He then underwent successful RCA stenting.   In March 2016, due to recurrent chest pain, he underwent successful stenting of the LAD.    he had recurrent chest pain again in June 2016 and was seen at [facility].  He underwent CABG 2 with a LIMA to the LAD and a vein graft to the obtuse marginal.   following CABG, he continued to have persistent/chronic low-level left-sided and midsternal chest pain.  He had repeat hospitalizations  with stress testing in November at [facility], which was negative,  catheterization at [facility] in December 2016, which apparently showed patent grafts.  Lab work in the hospital  showing total cholesterol 142, LDL 77, HDL 47  Echocardiogram 03/02/2014 showing normal LV function, essentially normal study  Echocardiogram 04/22/2012 showing ejection fraction 40%, no mention of wall motion abnormality, mild MR, TR  PMH:   has a past medical history of Arthritis; CAD (coronary artery disease); Cardiomyopathy, ischemic; Carotid arterial disease ([place]); Chronic Chest Pain; Chronic  systolic CHF (congestive heart failure) ([place]); Cough; Dyspnea; Headache (1970's); History of blood transfusion (2016); Hyperlipidemia; Hypertension; and Myocardial infarction ([place]).  PSH:    Past Surgical History:  Procedure Laterality Date  . CARDIAC  CATHETERIZATION  05/01/2012  . CARDIAC CATHETERIZATION  04/2013   [facility];x3 stent  . CARDIAC CATHETERIZATION  01/11/15    [facility]  . CARDIAC CATHETERIZATION N/A 09/16/2015   Procedure: LEFT HEART CATH AND CORS/GRAFTS ANGIOGRAPHY;  Surgeon: Minna Merritts, MD;  Location: [place] [facility];  Service: Cardiovascular;  Laterality: N/A;  . CARPAL TUNNEL RELEASE     right hand  . CATARACT EXTRACTION     LEFT  . CATARACT EXTRACTION W/PHACO Left 09/16/2014   Procedure: CATARACT EXTRACTION PHACO AND INTRAOCULAR LENS PLACEMENT (IOC);  Surgeon: Leandrew Koyanagi, MD;  Location: [place];  Service: Ophthalmology;  Laterality: Left;  . COLONOSCOPY    . COLONOSCOPY WITH PROPOFOL N/A 01/28/2016   Procedure: COLONOSCOPY WITH PROPOFOL;  Surgeon: Lollie Sails, MD;  Location: [facility] ENDOSCOPY;  Service: Endoscopy;  Laterality: N/A;  . CORONARY ANGIOPLASTY WITH STENT PLACEMENT  04/13/2014  . CORONARY ARTERY BYPASS GRAFT  06-26-14   x3 bypasses  . DE QUERVAIN'S RELEASE Left 08/22/2012  . ESOPHAGOGASTRODUODENOSCOPY (EGD) WITH PROPOFOL N/A 04/26/2015   Procedure: ESOPHAGOGASTRODUODENOSCOPY (EGD) WITH PROPOFOL;  Surgeon: Hulen Luster, MD;  Location: [facility] ENDOSCOPY;  Service: Gastroenterology;  Laterality: N/A;  . RIB PLATING N/A 05/25/2016   Procedure: STERNAL PLATING;  Surgeon: Melrose Nakayama, MD;  Location: [place];  Service: Thoracic;  Laterality: N/A;  . right shoulder    . STERNAL WIRES REMOVAL N/A 03/30/2016   Procedure: STERNAL WIRES REMOVAL;  Surgeon: Melrose Nakayama, MD;  Location: [place];  Service: Thoracic;  Laterality: N/A;  . TONSILLECTOMY      Current Outpatient Prescriptions  Medication Sig Dispense Refill  . ALPRAZolam (XANAX) 0.5 MG tablet Take 1 tablet (0.5 mg total) by mouth 3 (three) times daily as needed for sleep or anxiety. 30 tablet 0  . atorvastatin (LIPITOR) 80 MG tablet Take 80 mg by mouth at bedtime.    . carvedilol (COREG) 3.125 MG tablet Take 1 tablet (3.125 mg total) by  mouth daily before breakfast. 90 tablet 3  . isosorbide mononitrate (IMDUR) 60 MG 24 hr tablet Take 60 mg by mouth daily before breakfast. Before breakfast     . nitroGLYCERIN (NITROSTAT) 0.4 MG SL tablet Place 1 tablet (0.4 mg total) under the tongue every 5 (five) minutes as needed for chest pain. 25 tablet 6  . Rivaroxaban 15 & 20 MG TBPK Take as directed on package: Start with one 15mg  tablet by mouth twice a day with food. On Day 22, switch to one 20mg  tablet once a day with food.  Please refill with 20mg  qdaily xarelto after this month 51 each 1  . sertraline (ZOLOFT) 25 MG tablet Take 1 tablet (25 mg total) by mouth daily. 30 tablet 1  . traMADol (ULTRAM) 50 MG tablet Take 1-2 tablets (50-100 mg total) by mouth every 6 (six) hours as needed for moderate pain. 30 tablet 0  . zolpidem (AMBIEN) 5 MG tablet Take 5 mg by mouth at bedtime as needed for sleep.      No current facility-administered medications for this visit.      Allergies:   Contrast media [iodinated diagnostic agents]; Oxycodone hcl; Tramadol; and Vicodin [hydrocodone-acetaminophen]   Social History:  The patient  reports that he has quit smoking. His smoking use included  Cigarettes. He started smoking about 7 years ago. He has a 35.00 pack-year smoking history. He has never used smokeless tobacco. He reports that he does not drink alcohol or use drugs.   Family History:   family history includes Cancer in his maternal uncle; Heart attack (age of onset: 41) in his father.    Review of Systems: Review of Systems  Constitutional: Positive for malaise/fatigue.  Respiratory: Negative.   Cardiovascular: Positive for chest pain.  Gastrointestinal: Negative.   Musculoskeletal: Negative.   Neurological: Positive for dizziness.  Psychiatric/Behavioral: Negative.   All other systems reviewed and are negative.    PHYSICAL EXAM: VS:  There were no vitals taken for this visit. , BMI There is no height or weight on file to  calculate BMI.  GEN: Well nourished, well developed, in no acute distress  HEENT: normal  Neck: no JVD, carotid bruits, or masses Cardiac: RRR; no murmurs, rubs, or gallops,no edema  Well healed mediastinal incision Respiratory:  clear to auscultation bilaterally, normal work of breathing GI: soft, nontender, nondistended, + BS MS: no deformity or atrophy  Skin: warm and dry, no rash Neuro:  Strength and sensation are intact Psych: euthymic mood, full affect    Recent Labs: 01/07/2016: B Natriuretic Peptide 28.0 06/12/2016: ALT 13 08/08/2016: Hemoglobin 13.0; Platelets 273; TSH 0.253 08/09/2016: BUN 25; Creatinine, Ser 1.06; Potassium 4.1; Sodium 137    Lipid Panel Lab Results  Component Value Date   CHOL 111 06/12/2016   HDL 41 06/12/2016   LDLCALC 53 06/12/2016   TRIG 84 06/12/2016      Wt Readings from Last 3 Encounters:  08/09/16 164 lb 3.2 oz (74.5 kg)  07/25/16 175 lb 6.4 oz (79.6 kg)  06/12/16 172 lb 8 oz (78.2 kg)       ASSESSMENT AND PLAN:  PAF (paroxysmal atrial fibrillation) (HCC)CBC with Differential/Platelet, INR/PT Maintaining normal sinus rhythm, no changes made to his medications On anticoagulation  Chest pain, unspecified chest pain type -  Secondary to malunion of the sternum Surgery as detailed above Now with no chest pain   Coronary artery disease involving native coronary artery of native heart without angina pectoris We have stressed the importance of aggressive risk factor modification   nondiabetic, nonsmoker We will check cholesterol today  Hyperlipidemia No changes to the medications were made. Order placed for liver and lipid labs to be drawn in the office today   stable angina ([place]) Currently with stable symptoms   no further testing needed  Known stent to the LAD, RCA with bypass graft to the LAD and OM vessel   Total encounter time more than 25 minutes  Greater than 50% was spent in counseling and coordination of care with  the patient   Disposition:   F/U  6 month   No orders of the defined types were placed in this encounter.    Signed, Esmond Plants, M.D., Ph.D. 08/17/2016  [place], [place]

## 2016-08-18 DIAGNOSIS — I25709 Atherosclerosis of coronary artery bypass graft(s), unspecified, with unspecified angina pectoris: Secondary | ICD-10-CM | POA: Diagnosis not present

## 2016-08-18 DIAGNOSIS — R69 Illness, unspecified: Secondary | ICD-10-CM | POA: Diagnosis not present

## 2016-08-18 DIAGNOSIS — Z7901 Long term (current) use of anticoagulants: Secondary | ICD-10-CM | POA: Diagnosis not present

## 2016-08-18 DIAGNOSIS — I110 Hypertensive heart disease with heart failure: Secondary | ICD-10-CM | POA: Diagnosis not present

## 2016-08-18 DIAGNOSIS — Z951 Presence of aortocoronary bypass graft: Secondary | ICD-10-CM | POA: Diagnosis not present

## 2016-08-18 DIAGNOSIS — I480 Paroxysmal atrial fibrillation: Secondary | ICD-10-CM | POA: Diagnosis not present

## 2016-08-18 DIAGNOSIS — Z87891 Personal history of nicotine dependence: Secondary | ICD-10-CM | POA: Diagnosis not present

## 2016-08-18 DIAGNOSIS — T8132XD Disruption of internal operation (surgical) wound, not elsewhere classified, subsequent encounter: Secondary | ICD-10-CM | POA: Diagnosis not present

## 2016-08-18 DIAGNOSIS — I5022 Chronic systolic (congestive) heart failure: Secondary | ICD-10-CM | POA: Diagnosis not present

## 2016-08-21 ENCOUNTER — Telehealth: Payer: Self-pay | Admitting: Cardiovascular Disease

## 2016-08-21 DIAGNOSIS — T8132XD Disruption of internal operation (surgical) wound, not elsewhere classified, subsequent encounter: Secondary | ICD-10-CM | POA: Diagnosis not present

## 2016-08-21 DIAGNOSIS — I5022 Chronic systolic (congestive) heart failure: Secondary | ICD-10-CM | POA: Diagnosis not present

## 2016-08-21 DIAGNOSIS — I110 Hypertensive heart disease with heart failure: Secondary | ICD-10-CM | POA: Diagnosis not present

## 2016-08-21 DIAGNOSIS — I480 Paroxysmal atrial fibrillation: Secondary | ICD-10-CM | POA: Diagnosis not present

## 2016-08-21 DIAGNOSIS — R69 Illness, unspecified: Secondary | ICD-10-CM | POA: Diagnosis not present

## 2016-08-21 DIAGNOSIS — Z7901 Long term (current) use of anticoagulants: Secondary | ICD-10-CM | POA: Diagnosis not present

## 2016-08-21 DIAGNOSIS — I25709 Atherosclerosis of coronary artery bypass graft(s), unspecified, with unspecified angina pectoris: Secondary | ICD-10-CM | POA: Diagnosis not present

## 2016-08-21 DIAGNOSIS — Z951 Presence of aortocoronary bypass graft: Secondary | ICD-10-CM | POA: Diagnosis not present

## 2016-08-21 DIAGNOSIS — Z87891 Personal history of nicotine dependence: Secondary | ICD-10-CM | POA: Diagnosis not present

## 2016-08-21 NOTE — Telephone Encounter (Signed)
Pt has upcoming appt on Thursday w/ Christell Faith, PA.

## 2016-08-21 NOTE — Telephone Encounter (Signed)
[name] with [facility] calling in the vitals  sitting 112/68 hr 62 Standing 100/62 hr 66 Pulse was around 62  Pt looked a lot better and felts a lot better he stated to her She does feel like he may have been dehydrated, she advised him to drink more water   Please call if we have any concerns

## 2016-08-22 DIAGNOSIS — Z86711 Personal history of pulmonary embolism: Secondary | ICD-10-CM | POA: Diagnosis not present

## 2016-08-22 DIAGNOSIS — I2699 Other pulmonary embolism without acute cor pulmonale: Secondary | ICD-10-CM | POA: Diagnosis not present

## 2016-08-22 DIAGNOSIS — I5032 Chronic diastolic (congestive) heart failure: Secondary | ICD-10-CM | POA: Diagnosis not present

## 2016-08-22 DIAGNOSIS — R0609 Other forms of dyspnea: Secondary | ICD-10-CM | POA: Diagnosis not present

## 2016-08-22 DIAGNOSIS — D6859 Other primary thrombophilia: Secondary | ICD-10-CM | POA: Diagnosis not present

## 2016-08-23 NOTE — Progress Notes (Signed)
Cardiology Office Note Date:  08/24/2016  Patient ID:  Scott [name], Scott [name] [birth year], MRN [id number] PCP:  Madelyn Brunner, MD  Cardiologist:  Dr. Rockey Situ, MD    Chief Complaint: Hospital follow up  History of Present Illness: Scott [name] is a 76 y.o. male with history of CAD s/p CABG x 2 at [facility] in 2016 with chronic chest pain for well over one year without revealing cardiac workup, s/p removal of sternal wire without relief, s/p sternal debridement and plating on 05/25/2016 in the setting of CT chest performed by pain management showing sternal nonunion of the manubrium with subsequent improvement in his pain. Also with ICM with EF of 40-45% by echo in 01/2015, PAF on Eliquis, carotid artery disease, HTN, HLD, and anxiety who presents for hospital follow up after recent admission to [facility] from 7/13-7/18 for SOB, found to have a PE.   Cardiac cath in 04/2012 showed severe RCA disease with left-to-right collaterals, subtotally occluded LCx, and moderately diffuse LAD disease with medical management advised. Repeat LHC in 04/2013 with PCI/DES to the RCA. Repeat LHC in 03/2014 with PCI/DES to the mid LAD. Seen by [facility] in 05/2014 for 2nd opinion with cath showing 50% LAD stenosis and distal RCA disease. He underwent 2-vessel CABG in 06/2014 at [facility]. Follow up stress test in 11/2014 without ischemia. Cath at [facility] in 12/2014 without significant change. Multiple hospital admissions, ED visits, and office visits for chronic chest pain. Referred to pain management in [place]. CT chest performed by pain management showed sternal nonunion of the manubrium. He underwent sternal wire removal in 03/2016 followed by redo sternotomy with sternal plating on 05/25/2016 with improvement in his chest pain. He was most recently seen by [facility] on 7/3 with continued intermittent chest pain not felt to be acute. He was advised to slowly increase activities. Admitted 7/13 with increased SOB/chest pain that was atypical in nature. CTA chest  showed left upper lobe PE extending into the left main pulmonary artery. His Eliquis was changed to Xarelto. Lower extremity dopplers were negative for DVT. 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 No formal cancer workup. Echo showed stable EF at 60-65%, normal wall motion, mild MR, mildly dilated left atrium, RV systolic function was normal, PASP normal. During his admission he noted blurred visions and unsteady gait with MRI brain negative for acute findings. [place] neurology, ENT, and ophthalmology was advised. Sed rate normal, heavy metals negative, TSH mildly reduced at 0.253.  He comes in doing well today. He does continue to note mild shortness of breath though feels like this is improving. No chest pain. He continues to work with home physical therapy. He is now walking 0.5-1 miles daily and his driveway without issues. He is tolerating Xarelto without missing any doses. Has not noticed any melena, BRBPR, hematuria, hemoptysis or hematemesis. Heart rate at home has been running in the mid-40s to 50s bpm with home health PT. Prior to the diagnosis of his PE, he denied missing doses of Eliquis. Has not yet seen hematology for his protein S deficiency.   Past Medical History:  Diagnosis Date  . Arthritis    right hip, since a fall  . CAD (coronary artery disease)    a. 04/2012 Cath: 3VD->Med Rx;  b. 04/2013 PCI RCA (2 DES); c. 03/2014 PCI: LAD 80 (3.0x23 Xience [employer] DES), 30 ISR in RCA; d. 06/2014 CABG x 2 ([facility]) LIMA->LAD, VG->OM; e. 11/2014 Neg MV;  f. 12/2014 Cath ([facility]): patent grafts->Med  Rx; g. 03/2015 MV ([facility]) low risk, EF 40%; h. 05/2015 MV: low risk; i. 08/2015 Cath: patent grafts, patent RCA/LAD stents, small LCX/OM->Med Rx.  . Cardiomyopathy, ischemic    a. 04/2012 Echo: EF 40-45%;  b. 08/2013 Echo: EF 45-50%, mild glob HK, mod lat/post HK, diast dysfxn, mildly dil LA, mild MR, nl RVSP; c. 01/2015 Echo: EF 40-45%, Gr 1 DD, mildly dil LA.  . Carotid arterial disease ([place])    a.  05/2013 Carotid U/S; bilat 40-50% ICA stenosis.  . Chronic Chest Pain   . Chronic systolic CHF (congestive heart failure) ([place])    a. 01/2015 Echo: EF 40-45%.  . Cough   . Dyspnea    pt. unsure of exertion cause- that creates SOB at times   . Headache 1970's   migraine  . History of blood transfusion 2016   post op  . Hyperlipidemia   . Hypertension   . Myocardial infarction [facility])    unsure of when    Past Surgical History:  Procedure Laterality Date  . CARDIAC CATHETERIZATION  05/01/2012  . CARDIAC CATHETERIZATION  04/2013   [facility];x3 stent  . CARDIAC CATHETERIZATION  01/11/15    [facility]  . CARDIAC CATHETERIZATION N/A 09/16/2015   Procedure: LEFT HEART CATH AND CORS/GRAFTS ANGIOGRAPHY;  Surgeon: Minna Merritts, MD;  Location: [place] CV [facility];  Service: Cardiovascular;  Laterality: N/A;  . CARPAL TUNNEL RELEASE     right hand  . CATARACT EXTRACTION     LEFT  . CATARACT EXTRACTION W/PHACO Left 09/16/2014   Procedure: CATARACT EXTRACTION PHACO AND INTRAOCULAR LENS PLACEMENT (IOC);  Surgeon: Leandrew Koyanagi, MD;  Location: [place];  Service: Ophthalmology;  Laterality: Left;  . COLONOSCOPY    . COLONOSCOPY WITH PROPOFOL N/A 01/28/2016   Procedure: COLONOSCOPY WITH PROPOFOL;  Surgeon: Lollie Sails, MD;  [facility];  Service: Endoscopy;  Laterality: N/A;  . CORONARY ANGIOPLASTY WITH STENT PLACEMENT  04/13/2014  . CORONARY ARTERY BYPASS GRAFT  06-26-14   x3 bypasses  . DE QUERVAIN'S RELEASE Left 08/22/2012  . ESOPHAGOGASTRODUODENOSCOPY (EGD) WITH PROPOFOL N/A 04/26/2015   Procedure: ESOPHAGOGASTRODUODENOSCOPY (EGD) WITH PROPOFOL;  Surgeon: Hulen Luster, MD;  Location: [facility] ENDOSCOPY;  Service: Gastroenterology;  Laterality: N/A;  . RIB PLATING N/A 05/25/2016   Procedure: STERNAL PLATING;  Surgeon: Melrose Nakayama, MD;  Location: [place];  Service: Thoracic;  Laterality: N/A;  . right shoulder    . STERNAL WIRES REMOVAL N/A 03/30/2016   Procedure: STERNAL WIRES  REMOVAL;  Surgeon: Melrose Nakayama, MD;  Location: [place];  Service: Thoracic;  Laterality: N/A;  . TONSILLECTOMY      Current Meds  Medication Sig  . ALPRAZolam (XANAX) 0.5 MG tablet Take 1 tablet (0.5 mg total) by mouth 3 (three) times daily as needed for sleep or anxiety.  Marland Kitchen atorvastatin (LIPITOR) 80 MG tablet Take 80 mg by mouth at bedtime.  . carvedilol (COREG) 3.125 MG tablet Take 1 tablet (3.125 mg total) by mouth daily before breakfast.  . isosorbide mononitrate (IMDUR) 60 MG 24 hr tablet Take 60 mg by mouth daily before breakfast. Before breakfast   . nitroGLYCERIN (NITROSTAT) 0.4 MG SL tablet Place 1 tablet (0.4 mg total) under the tongue every 5 (five) minutes as needed for chest pain.  . Rivaroxaban 15 & 20 MG TBPK Take as directed on package: Start with one 15mg  tablet by mouth twice a day with food. On Day 22, switch to one 20mg  tablet once a day  with food.  Please refill with 20mg  qdaily xarelto after this month  . sertraline (ZOLOFT) 25 MG tablet Take 1 tablet (25 mg total) by mouth daily.  . traMADol (ULTRAM) 50 MG tablet Take 1-2 tablets (50-100 mg total) by mouth every 6 (six) hours as needed for moderate pain.  Marland Kitchen zolpidem (AMBIEN) 5 MG tablet Take 5 mg by mouth at bedtime as needed for sleep.     Allergies:   Contrast media [iodinated diagnostic agents]; Oxycodone hcl; Tramadol; and Vicodin [hydrocodone-acetaminophen]   Social History:  The patient  reports that he has quit smoking. His smoking use included Cigarettes. He started smoking about 7 years ago. He has a 35.00 pack-year smoking history. He has never used smokeless tobacco. He reports that he does not drink alcohol or use drugs.   Family History:  The patient's family history includes Cancer in his maternal uncle; Heart attack (age of onset: 82) in his father.  ROS:   Review of Systems  Constitutional: Positive for malaise/fatigue. Negative for chills, diaphoresis, fever and weight loss.  HENT: Negative  for congestion.   Eyes: Negative for discharge and redness.  Respiratory: Positive for shortness of breath. Negative for cough, hemoptysis, sputum production and wheezing.   Cardiovascular: Negative for chest pain, palpitations, orthopnea, claudication, leg swelling and PND.  Gastrointestinal: Negative for abdominal pain, blood in stool, heartburn, melena, nausea and vomiting.  Genitourinary: Negative for dysuria, flank pain, frequency, hematuria and urgency.  Musculoskeletal: Negative for falls and myalgias.  Skin: Negative for rash.  Neurological: Positive for weakness. Negative for dizziness, tingling, tremors, sensory change, speech change, focal weakness and loss of consciousness.  Endo/Heme/Allergies: Does not bruise/bleed easily.  Psychiatric/Behavioral: Negative for substance abuse. The patient is nervous/anxious.   All other systems reviewed and are negative.    PHYSICAL EXAM:  VS:  BP 110/70 (BP Location: Left Arm, Patient Position: Sitting, Cuff Size: Normal)   Pulse (!) 51   Ht 5\' 10"  (1.778 m)   Wt 171 lb 12 oz (77.9 kg)   BMI 24.64 kg/m  BMI: Body mass index is 24.64 kg/m.  Physical Exam  Constitutional: He is oriented to person, place, and time. He appears well-developed and well-nourished.  HENT:  Head: Normocephalic and atraumatic.  Eyes: Right eye exhibits no discharge. Left eye exhibits no discharge.  Neck: Normal range of motion. No JVD present.  Cardiovascular: Normal rate, regular rhythm, S1 normal, S2 normal and normal heart sounds.  Exam reveals no distant heart sounds, no friction rub, no midsystolic click and no opening snap.   No murmur heard. Pulmonary/Chest: Effort normal and breath sounds normal. No respiratory distress. He has no decreased breath sounds. He has no wheezes. He has no rales. He exhibits no tenderness.  Abdominal: Soft. He exhibits no distension. There is no tenderness.  Musculoskeletal: He exhibits no edema.  Neurological: He is alert  and oriented to person, place, and time.  Skin: Skin is warm and dry. No cyanosis. Nails show no clubbing.  Psychiatric: He has a normal mood and affect. His speech is normal and behavior is normal. Judgment and thought content normal.     EKG:  Was ordered and interpreted by me today. Shows sinus bradycardia, 51 bpm, nonspecific IVCD, prior inferior infarct  Recent Labs: 01/07/2016: B Natriuretic Peptide 28.0 06/12/2016: ALT 13 08/08/2016: Hemoglobin 13.0; Platelets 273; TSH 0.253 08/09/2016: BUN 25; Creatinine, Ser 1.06; Potassium 4.1; Sodium 137  06/12/2016: Chol/HDL Ratio 2.7; Cholesterol, Total 111; HDL 41; LDL Calculated 53;  Triglycerides 84   Estimated Creatinine Clearance: 63.1 mL/min (by C-G formula based on SCr of 1.06 mg/dL).   Wt Readings from Last 3 Encounters:  08/24/16 171 lb 12 oz (77.9 kg)  08/09/16 164 lb 3.2 oz (74.5 kg)  07/25/16 175 lb 6.4 oz (79.6 kg)     Other studies reviewed: Additional studies/records reviewed today include: summarized above  ASSESSMENT AND PLAN:  1. Pulmonary embolism: This was noted in the setting of being on Eliquis. 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 He has been changed to [facility], PE/DVT dosing. Recommend hematology referral given his diagnosed pulmonary embolism in the setting of being on Eliquis as well as given his protein S deficiency. I am uncertain if the patient should remain on Xarelto or be changed to warfarin. Will defer this decision to hematology. It would also be reasonable for the patient to have a formal cancer workup as we have seen some patients with newly diagnosed PE/DVT while on DOAC have an underlying malignancy. Patient was notified of this concern. I will defer this workup to PCP.  2.  Shortness of breath: Improving. Likely in the setting of patient's pulmonary embolism as above. Continue to work with home help PT. Echocardiogram during recent hospital admission showed normal LV systolic function and  normal right-sided pressure.  3. CAD status post CABG: No symptoms concerning for angina. On Xarelto in place of aspirin. Continue carvedilol, Lipitor, isosorbide, and when necessary sublingual nitroglycerin. No further plans for ischemic evaluation at this time.  4. Ischemic cardiomyopathy: He does not appear volume overloaded at this time. Recent transthoracic echocardiogram as above. Advised patient should he note weight gain greater than 3 pounds overnight or 5 pounds in a 7 day. He is advised to call the office for diuretic advice. CHF education.  5. PAF: Remains in sinus rhythm with bradycardic rate. Heart rate at home have been in the mid 40s to low 50s bpm. Because of this, I will decrease his carvedilol to once daily. Should he continued to note further bradycardic rates consider discontinuation at that time. On Xarelto as above.  Disposition: F/u with Dr. Rockey Situ, MD in 3 months.   Current medicines are reviewed at length with the patient today.  The patient did not have any concerns regarding medicines.  Melvern Banker PA-C 08/24/2016 11:06 AM     [facility] [address] [place], [place] [address] [phone]

## 2016-08-24 ENCOUNTER — Encounter: Payer: Self-pay | Admitting: Physician Assistant

## 2016-08-24 ENCOUNTER — Ambulatory Visit (INDEPENDENT_AMBULATORY_CARE_PROVIDER_SITE_OTHER): Payer: Medicare HMO | Admitting: Physician Assistant

## 2016-08-24 VITALS — BP 110/70 | HR 51 | Ht 70.0 in | Wt 171.8 lb

## 2016-08-24 DIAGNOSIS — I110 Hypertensive heart disease with heart failure: Secondary | ICD-10-CM | POA: Diagnosis not present

## 2016-08-24 DIAGNOSIS — I2699 Other pulmonary embolism without acute cor pulmonale: Secondary | ICD-10-CM

## 2016-08-24 DIAGNOSIS — I2510 Atherosclerotic heart disease of native coronary artery without angina pectoris: Secondary | ICD-10-CM | POA: Diagnosis not present

## 2016-08-24 DIAGNOSIS — Z951 Presence of aortocoronary bypass graft: Secondary | ICD-10-CM | POA: Diagnosis not present

## 2016-08-24 DIAGNOSIS — I5022 Chronic systolic (congestive) heart failure: Secondary | ICD-10-CM | POA: Diagnosis not present

## 2016-08-24 DIAGNOSIS — I480 Paroxysmal atrial fibrillation: Secondary | ICD-10-CM | POA: Diagnosis not present

## 2016-08-24 DIAGNOSIS — I25709 Atherosclerosis of coronary artery bypass graft(s), unspecified, with unspecified angina pectoris: Secondary | ICD-10-CM | POA: Diagnosis not present

## 2016-08-24 DIAGNOSIS — I10 Essential (primary) hypertension: Secondary | ICD-10-CM | POA: Diagnosis not present

## 2016-08-24 DIAGNOSIS — I255 Ischemic cardiomyopathy: Secondary | ICD-10-CM

## 2016-08-24 DIAGNOSIS — D6859 Other primary thrombophilia: Secondary | ICD-10-CM | POA: Diagnosis not present

## 2016-08-24 DIAGNOSIS — R69 Illness, unspecified: Secondary | ICD-10-CM | POA: Diagnosis not present

## 2016-08-24 DIAGNOSIS — Z7901 Long term (current) use of anticoagulants: Secondary | ICD-10-CM | POA: Diagnosis not present

## 2016-08-24 DIAGNOSIS — Z87891 Personal history of nicotine dependence: Secondary | ICD-10-CM | POA: Diagnosis not present

## 2016-08-24 DIAGNOSIS — T8132XD Disruption of internal operation (surgical) wound, not elsewhere classified, subsequent encounter: Secondary | ICD-10-CM | POA: Diagnosis not present

## 2016-08-24 NOTE — Patient Instructions (Signed)
Medication Instructions:  Your physician has recommended you make the following change in your medication:  1- START taking Coreg 3.125 mg once a day in the morning.   Labwork: none  Testing/Procedures: none  Follow-Up: You have been referred to Hematology for evaluation.  Your physician recommends that you schedule a follow-up appointment in: [place].   If you need a refill on your cardiac medications before your next appointment, please call your pharmacy.

## 2016-08-31 ENCOUNTER — Encounter: Payer: Self-pay | Admitting: Oncology

## 2016-08-31 ENCOUNTER — Inpatient Hospital Stay: Payer: Medicare HMO | Attending: Oncology | Admitting: Oncology

## 2016-08-31 ENCOUNTER — Inpatient Hospital Stay: Payer: Medicare HMO

## 2016-08-31 VITALS — BP 95/57 | HR 58 | Temp 97.8°F | Resp 16 | Ht 69.5 in | Wt 172.4 lb

## 2016-08-31 DIAGNOSIS — I255 Ischemic cardiomyopathy: Secondary | ICD-10-CM | POA: Insufficient documentation

## 2016-08-31 DIAGNOSIS — T8132XD Disruption of internal operation (surgical) wound, not elsewhere classified, subsequent encounter: Secondary | ICD-10-CM | POA: Diagnosis not present

## 2016-08-31 DIAGNOSIS — D509 Iron deficiency anemia, unspecified: Secondary | ICD-10-CM | POA: Insufficient documentation

## 2016-08-31 DIAGNOSIS — I2699 Other pulmonary embolism without acute cor pulmonale: Secondary | ICD-10-CM | POA: Insufficient documentation

## 2016-08-31 DIAGNOSIS — I2510 Atherosclerotic heart disease of native coronary artery without angina pectoris: Secondary | ICD-10-CM | POA: Insufficient documentation

## 2016-08-31 DIAGNOSIS — Z7901 Long term (current) use of anticoagulants: Secondary | ICD-10-CM

## 2016-08-31 DIAGNOSIS — R69 Illness, unspecified: Secondary | ICD-10-CM | POA: Diagnosis not present

## 2016-08-31 DIAGNOSIS — I252 Old myocardial infarction: Secondary | ICD-10-CM | POA: Insufficient documentation

## 2016-08-31 DIAGNOSIS — I480 Paroxysmal atrial fibrillation: Secondary | ICD-10-CM | POA: Diagnosis not present

## 2016-08-31 DIAGNOSIS — Z79899 Other long term (current) drug therapy: Secondary | ICD-10-CM | POA: Diagnosis not present

## 2016-08-31 DIAGNOSIS — I5022 Chronic systolic (congestive) heart failure: Secondary | ICD-10-CM | POA: Diagnosis not present

## 2016-08-31 DIAGNOSIS — Z87891 Personal history of nicotine dependence: Secondary | ICD-10-CM | POA: Diagnosis not present

## 2016-08-31 DIAGNOSIS — I25709 Atherosclerosis of coronary artery bypass graft(s), unspecified, with unspecified angina pectoris: Secondary | ICD-10-CM | POA: Diagnosis not present

## 2016-08-31 DIAGNOSIS — R718 Other abnormality of red blood cells: Secondary | ICD-10-CM

## 2016-08-31 DIAGNOSIS — K5730 Diverticulosis of large intestine without perforation or abscess without bleeding: Secondary | ICD-10-CM | POA: Insufficient documentation

## 2016-08-31 DIAGNOSIS — Z951 Presence of aortocoronary bypass graft: Secondary | ICD-10-CM | POA: Diagnosis not present

## 2016-08-31 DIAGNOSIS — I110 Hypertensive heart disease with heart failure: Secondary | ICD-10-CM | POA: Diagnosis not present

## 2016-08-31 DIAGNOSIS — E785 Hyperlipidemia, unspecified: Secondary | ICD-10-CM | POA: Diagnosis not present

## 2016-08-31 DIAGNOSIS — Z955 Presence of coronary angioplasty implant and graft: Secondary | ICD-10-CM | POA: Diagnosis not present

## 2016-08-31 LAB — URINALYSIS, COMPLETE (UACMP) WITH MICROSCOPIC
Bacteria, UA: NONE SEEN
Bilirubin Urine: NEGATIVE
GLUCOSE, UA: NEGATIVE mg/dL
Hgb urine dipstick: NEGATIVE
Ketones, ur: NEGATIVE mg/dL
Leukocytes, UA: NEGATIVE
Nitrite: NEGATIVE
Protein, ur: NEGATIVE mg/dL
SPECIFIC GRAVITY, URINE: 1.016 (ref 1.005–1.030)
Squamous Epithelial / LPF: NONE SEEN
WBC, UA: NONE SEEN WBC/hpf (ref 0–5)
pH: 7 (ref 5.0–8.0)

## 2016-08-31 LAB — CBC WITH DIFFERENTIAL/PLATELET
Basophils Absolute: 0 10*3/uL (ref 0–0.1)
Basophils Relative: 1 %
EOS ABS: 0.1 10*3/uL (ref 0–0.7)
EOS PCT: 2 %
HCT: 36.9 % — ABNORMAL LOW (ref 40.0–52.0)
Hemoglobin: 12 g/dL — ABNORMAL LOW (ref 13.0–18.0)
LYMPHS ABS: 1.8 10*3/uL (ref 1.0–3.6)
Lymphocytes Relative: 40 %
MCH: 25 pg — ABNORMAL LOW (ref 26.0–34.0)
MCHC: 32.5 g/dL (ref 32.0–36.0)
MCV: 76.9 fL — ABNORMAL LOW (ref 80.0–100.0)
MONOS PCT: 10 %
Monocytes Absolute: 0.4 10*3/uL (ref 0.2–1.0)
Neutro Abs: 2.2 10*3/uL (ref 1.4–6.5)
Neutrophils Relative %: 47 %
PLATELETS: 276 10*3/uL (ref 150–440)
RBC: 4.8 MIL/uL (ref 4.40–5.90)
RDW: 17 % — AB (ref 11.5–14.5)
WBC: 4.5 10*3/uL (ref 3.8–10.6)

## 2016-08-31 LAB — FERRITIN: Ferritin: 8 ng/mL — ABNORMAL LOW (ref 24–336)

## 2016-08-31 LAB — IRON AND TIBC
IRON: 24 ug/dL — AB (ref 45–182)
Saturation Ratios: 6 % — ABNORMAL LOW (ref 17.9–39.5)
TIBC: 390 ug/dL (ref 250–450)
UIBC: 367 ug/dL

## 2016-08-31 MED ORDER — FERROUS SULFATE 325 (65 FE) MG PO TBEC: 325.0000 mg | DELAYED_RELEASE_TABLET | Freq: Three times a day (TID) | ORAL | 3 refills | Status: DC

## 2016-08-31 NOTE — Progress Notes (Signed)
Patient here today as a new patient for PE

## 2016-08-31 NOTE — Progress Notes (Signed)
[facility] Initial Visit:  Patient Care Team: Madelyn Brunner, MD as PCP - General (Internal Medicine) Madelyn Brunner, MD (Internal Medicine) Alisa Graff, FNP as Nurse Practitioner (Cardiology) Minna Merritts, MD as Consulting Physician (Cardiology)  CHIEF COMPLAINTS/PURPOSE OF CONSULTATION: I have lung clot.   HISTORY OF PRESENTING ILLNESS: Scott [name] 76 y.o. male with PMH listed as below who is referred to me for evaluation and management of pulmonary embolism.  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 Perioperatively his anticoagulation with Eliquis was discontinue a few days prior surgery and resumed after surgery. Operation note mentioned that during the procedure, the left lung was densely adherent to the underside of the sternum and tear in the lung did occur and was sutured. He presents  to [facility] on 08/04/2016 for evaluation of SOB and was found to have a PE. Patient states that he was compliant with Eliquis.  CTA chest showed left upper lobe PE extending into the left main pulmonary artery. His Eliquis was changed to Xarelto. Lower extremity dopplers were negative for DVT. Hypercoagulable workup was sent and all tests were negative except a mild low protein S function.  No formal cancer workup.  Sed rate normal, heavy metals negative, TSH mildly reduced at 0.253. 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   He comes in doing well today. He does continue to note mild shortness of breath though feels like this is improving.  He is tolerating  Xarelto without any bleeding events or easy bruising. Has not noticed any melena, BRBPR, hematuria, hemoptysis or hematemesis. Denies fever/chills, weight loss, night sweats, abdominal pain or lower extremity swelling.    Review of Systems  Constitutional: Negative.   HENT:  Negative.   Eyes: Negative.   Respiratory: Positive for shortness of breath.        SOB with exertion.   Cardiovascular: Positive for chest pain.       Chronic Intermittent chest pain.  Gastrointestinal: Negative.   Endocrine: Negative.   Genitourinary: Negative.    Musculoskeletal: Negative.   Skin: Negative.     MEDICAL HISTORY: Past Medical History:  Diagnosis Date  . Arthritis    right hip, since a fall  . CAD (coronary artery disease)    a. 04/2012 Cath: 3VD->Med Rx;  b. 04/2013 PCI RCA (2 DES); c. 03/2014 PCI: LAD 80 (3.0x23 Xience Alpine DES), 30 ISR in RCA; d. 06/2014 CABG x 2 ([facility]) LIMA->LAD, VG->OM; e. 11/2014 Neg MV;  f. 12/2014 Cath ([facility]): patent grafts->Med Rx; g. 03/2015 MV ([facility]) low risk, EF 40%; h. 05/2015 MV: low risk; i. 08/2015 Cath: patent grafts, patent RCA/LAD stents, small LCX/OM->Med Rx.  . Cardiomyopathy, ischemic    a. 04/2012 Echo: EF 40-45%;  b. 08/2013 Echo: EF 45-50%, mild glob HK, mod lat/post HK, diast dysfxn, mildly dil LA, mild MR, nl RVSP; c. 01/2015 Echo: EF 40-45%, Gr 1 DD, mildly dil LA.  . Carotid arterial disease ([place])    a. 05/2013 Carotid U/S; bilat 40-50% ICA stenosis.  . Chronic Chest Pain   .  Chronic systolic CHF (congestive heart failure) ([place])    a. 01/2015 Echo: EF 40-45%.  . Cough   . Dyspnea    pt. unsure of exertion cause- that creates SOB at times   . Headache 1970's   migraine  . History of blood transfusion 2016   post op  . Hyperlipidemia   . Hypertension   . Myocardial infarction [facility])    unsure of when    SURGICAL HISTORY: Past Surgical History:  Procedure Laterality Date  . CARDIAC CATHETERIZATION  05/01/2012  . CARDIAC CATHETERIZATION  04/2013    [facility];x3 stent  . CARDIAC CATHETERIZATION  01/11/15    [facility]  . CARDIAC CATHETERIZATION N/A 09/16/2015   Procedure: LEFT HEART CATH AND CORS/GRAFTS ANGIOGRAPHY;  Surgeon: Minna Merritts, MD;  Location: [place] [facility];  Service: Cardiovascular;  Laterality: N/A;  . CARPAL TUNNEL RELEASE     right hand  . CATARACT EXTRACTION     LEFT  . CATARACT EXTRACTION W/PHACO Left 09/16/2014   Procedure: CATARACT EXTRACTION PHACO AND INTRAOCULAR LENS PLACEMENT (IOC);  Surgeon: Leandrew Koyanagi, MD;  Location: [place];  Service: Ophthalmology;  Laterality: Left;  . COLONOSCOPY    . COLONOSCOPY WITH PROPOFOL N/A 01/28/2016   Procedure: COLONOSCOPY WITH PROPOFOL;  Surgeon: Lollie Sails, MD;  Location: [facility] ENDOSCOPY;  Service: Endoscopy;  Laterality: N/A;  . CORONARY ANGIOPLASTY WITH STENT PLACEMENT  04/13/2014  . CORONARY ARTERY BYPASS GRAFT  06-26-14   x3 bypasses  . DE QUERVAIN'S RELEASE Left 08/22/2012  . ESOPHAGOGASTRODUODENOSCOPY (EGD) WITH PROPOFOL N/A 04/26/2015   Procedure: ESOPHAGOGASTRODUODENOSCOPY (EGD) WITH PROPOFOL;  Surgeon: Hulen Luster, MD;  Location: [facility] ENDOSCOPY;  Service: Gastroenterology;  Laterality: N/A;  . RIB PLATING N/A 05/25/2016   Procedure: STERNAL PLATING;  Surgeon: Melrose Nakayama, MD;  Location: [place];  Service: Thoracic;  Laterality: N/A;  . right shoulder    . STERNAL WIRES REMOVAL N/A 03/30/2016   Procedure: STERNAL WIRES REMOVAL;  Surgeon: Melrose Nakayama, MD;  Location: [place];  Service: Thoracic;  Laterality: N/A;  . TONSILLECTOMY      SOCIAL HISTORY: Social History   Social History  . Marital status: Married    Spouse name: N/A  . Number of children: N/A  . Years of education: N/A   Occupational History  . Not on file.   Social History Main Topics  . Smoking status: Former Smoker    Packs/day: 1.00    Years: 35.00    Types: Cigarettes    Start date: 05/02/2009  . Smokeless tobacco: Never Used     Comment: quit in 2006  . Alcohol  use No  . Drug use: No  . Sexual activity: Yes   Other Topics Concern  . Not on file   Social History Narrative  . No narrative on file    FAMILY HISTORY Family History  Problem Relation Age of Onset  . Heart attack Father 82       MI  . Cancer Maternal Uncle     ALLERGIES:  is allergic to contrast media [iodinated diagnostic agents]; oxycodone hcl; tramadol; and vicodin [hydrocodone-acetaminophen].  MEDICATIONS:  Current Outpatient Prescriptions  Medication Sig Dispense Refill  . ALPRAZolam (XANAX) 0.5 MG tablet Take 1 tablet (0.5 mg total) by mouth 3 (three) times daily as needed for sleep or anxiety. 30 tablet 0  . atorvastatin (LIPITOR) 80 MG tablet Take 80 mg by mouth at bedtime.    . carvedilol (COREG) 3.125 MG tablet Take  1 tablet (3.125 mg total) by mouth daily before breakfast. 90 tablet 3  . isosorbide mononitrate (IMDUR) 60 MG 24 hr tablet Take 60 mg by mouth daily before breakfast. Before breakfast     . nitroGLYCERIN (NITROSTAT) 0.4 MG SL tablet Place 1 tablet (0.4 mg total) under the tongue every 5 (five) minutes as needed for chest pain. 25 tablet 6  . sertraline (ZOLOFT) 25 MG tablet Take 1 tablet (25 mg total) by mouth daily. 30 tablet 1  . traMADol (ULTRAM) 50 MG tablet Take 1-2 tablets (50-100 mg total) by mouth every 6 (six) hours as needed for moderate pain. 30 tablet 0  . XARELTO 20 MG TABS tablet Take 20 mg by mouth daily.  1  . zolpidem (AMBIEN) 5 MG tablet Take 5 mg by mouth at bedtime as needed for sleep.      No current facility-administered medications for this visit.     PHYSICAL EXAMINATION:  ECOG PERFORMANCE STATUS: 1 - Symptomatic but completely ambulatory   Vitals:   08/31/16 1407  BP: (!) 95/57  Pulse: (!) 58  Resp: 16  Temp: 97.8 F (36.6 C)    Filed Weights   08/31/16 1407  Weight: 172 lb 6 oz (78.2 kg)     Physical Exam GENERAL: No distress, well nourished.  SKIN:  No rashes or significant lesions  HEAD: Normocephalic,  No masses, lesions, tenderness or abnormalities  EYES: Conjunctiva are pink, non icteric ENT: External ears normal ,lips , buccal mucosa, and tongue normal and mucous membranes are moist  LYMPH: No palpable cervical and axillary lymphadenopathy  LUNGS: Clear to auscultation, no crackles or wheezes HEART: Regular rate & rhythm, no murmurs, no gallops, S1 normal and S2 normal  ABDOMEN: Abdomen soft, non-tender, normal bowel sounds, I did not appreciate any  masses or organomegaly  MUSCULOSKELETAL: No CVA tenderness and no tenderness on percussion of the back or rib cage.  EXTREMITIES: No edema, no skin discoloration or tenderness NEURO: Alert & oriented, no focal motor/sensory deficits.   LABORATORY DATA: I have personally reviewed the data as listed:  Appointment on 08/31/2016  Component Date Value Ref Range Status  . WBC 08/31/2016 4.5  3.8 - 10.6 K/uL Final  . RBC 08/31/2016 4.80  4.40 - 5.90 MIL/uL Final  . Hemoglobin 08/31/2016 12.0* 13.0 - 18.0 g/dL Final  . HCT 44/85/5336 36.9* 40.0 - 52.0 % Final  . MCV 08/31/2016 76.9* 80.0 - 100.0 fL Final  . MCH 08/31/2016 25.0* 26.0 - 34.0 pg Final  . MCHC 08/31/2016 32.5  32.0 - 36.0 g/dL Final  . RDW 47/58/6705 17.0* 11.5 - 14.5 % Final  . Platelets 08/31/2016 276  150 - 440 K/uL Final  . Neutrophils Relative % 08/31/2016 47  % Final  . Neutro Abs 08/31/2016 2.2  1.4 - 6.5 K/uL Final  . Lymphocytes Relative 08/31/2016 40  % Final  . Lymphs Abs 08/31/2016 1.8  1.0 - 3.6 K/uL Final  . Monocytes Relative 08/31/2016 10  % Final  . Monocytes Absolute 08/31/2016 0.4  0.2 - 1.0 K/uL Final  . Eosinophils Relative 08/31/2016 2  % Final  . Eosinophils Absolute 08/31/2016 0.1  0 - 0.7 K/uL Final  . Basophils Relative 08/31/2016 1  % Final  . Basophils Absolute 08/31/2016 0.0  0 - 0.1 K/uL Final  Office Visit on 08/31/2016  Component Date Value Ref Range Status  . Color, Urine 08/31/2016 YELLOW* YELLOW Final  . APPearance 08/31/2016 CLEAR*  CLEAR Final  . Specific  Gravity, Urine 08/31/2016 1.016  1.005 - 1.030 Final  . pH 08/31/2016 7.0  5.0 - 8.0 Final  . Glucose, UA 08/31/2016 NEGATIVE  NEGATIVE mg/dL Final  . Hgb urine dipstick 08/31/2016 NEGATIVE  NEGATIVE Final  . Bilirubin Urine 08/31/2016 NEGATIVE  NEGATIVE Final  . Ketones, ur 08/31/2016 NEGATIVE  NEGATIVE mg/dL Final  . Protein, ur 08/31/2016 NEGATIVE  NEGATIVE mg/dL Final  . Nitrite 08/31/2016 NEGATIVE  NEGATIVE Final  . Leukocytes, UA 08/31/2016 NEGATIVE  NEGATIVE Final  . RBC / HPF 08/31/2016 0-5  0 - 5 RBC/hpf Final  . WBC, UA 08/31/2016 NONE SEEN  0 - 5 WBC/hpf Final  . Bacteria, UA 08/31/2016 NONE SEEN  NONE SEEN Final  . Squamous Epithelial / LPF 08/31/2016 NONE SEEN  NONE SEEN Final  . Mucous 08/31/2016 PRESENT   Final  Admission on 08/04/2016, Discharged on 08/09/2016  Component Date Value Ref Range Status  . Sodium 08/04/2016 138  135 - 145 mmol/L Final  . Potassium 08/04/2016 3.8  3.5 - 5.1 mmol/L Final  . Chloride 08/04/2016 106  101 - 111 mmol/L Final  . CO2 08/04/2016 22  22 - 32 mmol/L Final  . Glucose, Bld 08/04/2016 86  65 - 99 mg/dL Final  . BUN 08/04/2016 17  6 - 20 mg/dL Final  . Creatinine, Ser 08/04/2016 1.19  0.61 - 1.24 mg/dL Final  . Calcium 08/04/2016 9.2  8.9 - 10.3 mg/dL Final  . GFR calc non Af Amer 08/04/2016 58* >60 mL/min Final  . GFR calc Af Amer 08/04/2016 >60  >60 mL/min Final   Comment: (NOTE) The eGFR has been calculated using the CKD EPI equation. This calculation has not been validated in all clinical situations. eGFR's persistently <60 mL/min signify possible Chronic Kidney Disease.   . Anion gap 08/04/2016 10  5 - 15 Final  . WBC 08/04/2016 6.1  3.8 - 10.6 K/uL Final  . RBC 08/04/2016 4.94  4.40 - 5.90 MIL/uL Final  . Hemoglobin 08/04/2016 12.6* 13.0 - 18.0 g/dL Final  . HCT 08/04/2016 38.6* 40.0 - 52.0 % Final  . MCV 08/04/2016 78.1* 80.0 - 100.0 fL Final  . MCH 08/04/2016 25.6* 26.0 - 34.0 pg Final  . MCHC  08/04/2016 32.8  32.0 - 36.0 g/dL Final  . RDW 08/04/2016 16.0* 11.5 - 14.5 % Final  . Platelets 08/04/2016 265  150 - 440 K/uL Final  . Troponin I 08/04/2016 <0.03  <0.03 ng/mL Final  . FIO2 08/04/2016 0.30   Final  . Delivery systems 08/04/2016 BILEVEL POSITIVE AIRWAY PRESSURE   Final  . pH, Ven 08/04/2016 7.54* 7.250 - 7.430 Final  . pCO2, Ven 08/04/2016 30* 44.0 - 60.0 mmHg Final  . Bicarbonate 08/04/2016 25.7  20.0 - 28.0 mmol/L Final  . Acid-Base Excess 08/04/2016 3.7* 0.0 - 2.0 mmol/L Final  . Patient temperature 08/04/2016 37.0   Final  . Collection site 08/04/2016 VEIN   Final  . Sample type 08/04/2016 VENOUS   Final  . aPTT 08/04/2016 33  24 - 36 seconds Final  . Prothrombin Time 08/04/2016 15.8* 11.4 - 15.2 seconds Final  . INR 08/04/2016 1.25   Final  . Heparin Unfractionated 08/04/2016 3.14* 0.30 - 0.70 IU/mL Final   Comment: RESULTS CONFIRMED BY MANUAL DILUTION        IF HEPARIN RESULTS ARE BELOW EXPECTED VALUES, AND PATIENT DOSAGE HAS BEEN CONFIRMED, SUGGEST FOLLOW UP TESTING OF ANTITHROMBIN III LEVELS.   Marland Kitchen Heparin Unfractionated 08/04/2016 1.97* 0.30 - 0.70 IU/mL Final  Comment:        IF HEPARIN RESULTS ARE BELOW EXPECTED VALUES, AND PATIENT DOSAGE HAS BEEN CONFIRMED, SUGGEST FOLLOW UP TESTING OF ANTITHROMBIN III LEVELS.   Marland Kitchen aPTT 08/04/2016 140* 24 - 36 seconds Final   Comment:        IF BASELINE aPTT IS ELEVATED, SUGGEST PATIENT RISK ASSESSMENT BE USED TO DETERMINE APPROPRIATE ANTICOAGULANT THERAPY.   . Troponin I 08/04/2016 <0.03  <0.03 ng/mL Final  . Troponin I 08/04/2016 <0.03  <0.03 ng/mL Final  . Troponin I 08/05/2016 <0.03  <0.03 ng/mL Final  . Weight 08/05/2016 2644.8  oz Final  . Height 08/05/2016 70  in Final  . BP 08/05/2016 130/76  mmHg Final  . Sodium 08/05/2016 139  135 - 145 mmol/L Final  . Potassium 08/05/2016 3.9  3.5 - 5.1 mmol/L Final  . Chloride 08/05/2016 108  101 - 111 mmol/L Final  . CO2 08/05/2016 25  22 - 32 mmol/L Final  .  Glucose, Bld 08/05/2016 102* 65 - 99 mg/dL Final  . BUN 08/05/2016 15  6 - 20 mg/dL Final  . Creatinine, Ser 08/05/2016 1.09  0.61 - 1.24 mg/dL Final  . Calcium 08/05/2016 8.8* 8.9 - 10.3 mg/dL Final  . GFR calc non Af Amer 08/05/2016 >60  >60 mL/min Final  . GFR calc Af Amer 08/05/2016 >60  >60 mL/min Final   Comment: (NOTE) The eGFR has been calculated using the CKD EPI equation. This calculation has not been validated in all clinical situations. eGFR's persistently <60 mL/min signify possible Chronic Kidney Disease.   . Anion gap 08/05/2016 6  5 - 15 Final  . WBC 08/05/2016 5.7  3.8 - 10.6 K/uL Final  . RBC 08/05/2016 4.71  4.40 - 5.90 MIL/uL Final  . Hemoglobin 08/05/2016 12.1* 13.0 - 18.0 g/dL Final  . HCT 08/05/2016 36.8* 40.0 - 52.0 % Final  . MCV 08/05/2016 78.0* 80.0 - 100.0 fL Final  . MCH 08/05/2016 25.6* 26.0 - 34.0 pg Final  . MCHC 08/05/2016 32.8  32.0 - 36.0 g/dL Final  . RDW 08/05/2016 16.3* 11.5 - 14.5 % Final  . Platelets 08/05/2016 232  150 - 440 K/uL Final  . Heparin Unfractionated 08/05/2016 1.54* 0.30 - 0.70 IU/mL Final   Comment:        IF HEPARIN RESULTS ARE BELOW EXPECTED VALUES, AND PATIENT DOSAGE HAS BEEN CONFIRMED, SUGGEST FOLLOW UP TESTING OF ANTITHROMBIN III LEVELS.   Marland Kitchen aPTT 08/05/2016 102* 24 - 36 seconds Final   Comment:        IF BASELINE aPTT IS ELEVATED, SUGGEST PATIENT RISK ASSESSMENT BE USED TO DETERMINE APPROPRIATE ANTICOAGULANT THERAPY.   . Glucose-Capillary 08/05/2016 129* 65 - 99 mg/dL Final  . Glucose-Capillary 08/05/2016 116* 65 - 99 mg/dL Final  . Glucose-Capillary 08/05/2016 124* 65 - 99 mg/dL Final  . Glucose-Capillary 08/05/2016 111* 65 - 99 mg/dL Final  . Glucose-Capillary 08/06/2016 106* 65 - 99 mg/dL Final  . Glucose-Capillary 08/06/2016 87  65 - 99 mg/dL Final  . Glucose-Capillary 08/06/2016 91  65 - 99 mg/dL Final  . Glucose-Capillary 08/06/2016 98  65 - 99 mg/dL Final  . Glucose-Capillary 08/07/2016 82  65 - 99 mg/dL  Final  . Glucose-Capillary 08/07/2016 136* 65 - 99 mg/dL Final  . Creatinine, Ser 08/08/2016 1.10  0.61 - 1.24 mg/dL Final  . GFR calc non Af Amer 08/08/2016 >60  >60 mL/min Final  . GFR calc Af Amer 08/08/2016 >60  >60 mL/min Final   Comment: (NOTE)  The eGFR has been calculated using the CKD EPI equation. This calculation has not been validated in all clinical situations. eGFR's persistently <60 mL/min signify possible Chronic Kidney Disease.   . WBC 08/08/2016 14.1* 3.8 - 10.6 K/uL Final  . RBC 08/08/2016 5.16  4.40 - 5.90 MIL/uL Final  . Hemoglobin 08/08/2016 13.0  13.0 - 18.0 g/dL Final  . HCT 08/08/2016 40.2  40.0 - 52.0 % Final  . MCV 08/08/2016 78.0* 80.0 - 100.0 fL Final  . MCH 08/08/2016 25.3* 26.0 - 34.0 pg Final  . MCHC 08/08/2016 32.4  32.0 - 36.0 g/dL Final  . RDW 08/08/2016 16.7* 11.5 - 14.5 % Final  . Platelets 08/08/2016 273  150 - 440 K/uL Final  . Glucose-Capillary 08/08/2016 137* 65 - 99 mg/dL Final  . Color, Urine 08/08/2016 YELLOW* YELLOW Final  . APPearance 08/08/2016 CLEAR* CLEAR Final  . Specific Gravity, Urine 08/08/2016 1.032* 1.005 - 1.030 Final  . pH 08/08/2016 5.0  5.0 - 8.0 Final  . Glucose, UA 08/08/2016 NEGATIVE  NEGATIVE mg/dL Final  . Hgb urine dipstick 08/08/2016 NEGATIVE  NEGATIVE Final  . Bilirubin Urine 08/08/2016 NEGATIVE  NEGATIVE Final  . Ketones, ur 08/08/2016 NEGATIVE  NEGATIVE mg/dL Final  . Protein, ur 08/08/2016 NEGATIVE  NEGATIVE mg/dL Final  . Nitrite 08/08/2016 NEGATIVE  NEGATIVE Final  . Leukocytes, UA 08/08/2016 NEGATIVE  NEGATIVE Final  . RBC / HPF 08/08/2016 0-5  0 - 5 RBC/hpf Final  . WBC, UA 08/08/2016 0-5  0 - 5 WBC/hpf Final  . Bacteria, UA 08/08/2016 NONE SEEN  NONE SEEN Final  . Squamous Epithelial / LPF 08/08/2016 NONE SEEN  NONE SEEN Final  . Mucous 08/08/2016 PRESENT   Final  . AntiThromb III Func 08/08/2016 93  75 - 120 % Final   Performed at [facility], [place] [address], [place], [place] [address]  . Protein  C Activity 08/08/2016 92  73 - 180 % Final   Comment: (NOTE) Performed At: [facility] [place], [place] [id number] Lindon Romp MD RS:[id number]   . Protein C, Total 08/08/2016 76  60 - 150 % Final   Comment: (NOTE) Performed At: [facility] [place], [place] [id number] Lindon Romp MD BZ:[id number]   . Protein S Activity 08/08/2016 42* 63 - 140 % Final   Comment: (NOTE) A deficiency of protein S (PS), either congenital or acquired, increases the risk of thromboembolism. PS activity levels may be falsely low in individuals with APCR/Factor V Leiden. Consider performing free protein S antigen in those with APCR/Factor V Leiden before making a diagnosis of protein S deficiency. Acquired PS deficiency is more common than congenital deficiency. PS values decrease with normal pregnancy, and are also dependent on age, sex and hormone status. PS values tend to be lower in a younger age group and lower in women than in men. Levels may be decreased in pre-menopausal women on oral contraceptive agents. Acquired deficiency can occur as a result of vitamin K deficiency or antagonism, severe hepatic disorders, (hepatitis, cirrhosis, etc.), nephrotic syndrome, inflammatory bowel disease, certain chemotherapeutic agents, L-asparaginse therapy, sepsis, disseminated intravascular coagulation (DIC) and acute thrombosis. Levels may be decreased in                           patients with polycythemia vera, sickle cell disease and essential thrombocythemia. Repeat evaluation on a new plasma sample to confirm or refute this result  should be considered, after ruling out acquired causes, depending on the clinical scenario. Performed At: [facility] [place], [place] [id number] Lindon Romp MD YJ:[id number]   . Protein S Ag, Total 08/08/2016 82  60 - 150 % Final   Comment: (NOTE) This test was developed and its  performance characteristics determined by [facility]. It has not been cleared or approved by the Food and Drug Administration. Performed At: [facility] [place], [place] [id number] Lindon Romp MD YD:[id number]   . PTT Lupus Anticoagulant 08/08/2016 32.8  0.0 - 51.9 sec Final  . DRVVT 08/08/2016 46.5  0.0 - 47.0 sec Final  . Lupus Anticoag Interp 08/08/2016 Comment:   Corrected   Comment: (NOTE) No lupus anticoagulant was detected. Performed At: [facility] [place], [place] [id number] Lindon Romp MD GE:[id number]   . Beta-2 Glyco I IgG 08/08/2016 <9  0 - 20 GPI IgG units Final   Comment: (NOTE) The reference interval reflects a 3SD or 99th percentile interval, which is thought to represent a potentially clinically significant result in accordance with the International Consensus Statement on the classification criteria for definitive antiphospholipid syndrome (APS). J Thromb Haem 2006;4:295-306.   . Beta-2-Glycoprotein I IgM 08/08/2016 <9  0 - 32 GPI IgM units Final   Comment: (NOTE) The reference interval reflects a 3SD or 99th percentile interval, which is thought to represent a potentially clinically significant result in accordance with the International Consensus Statement on the classification criteria for definitive antiphospholipid syndrome (APS). J Thromb Haem 2006;4:295-306. Performed At: [facility] [place], [place] [id number] Lindon Romp MD CL:[id number]   . Beta-2-Glycoprotein I IgA 08/08/2016 <9  0 - 25 GPI IgA units Final   Comment: (NOTE) The reference interval reflects a 3SD or 99th percentile interval, which is thought to represent a potentially clinically significant result in accordance with the International Consensus Statement on the classification criteria for definitive antiphospholipid syndrome (APS). J Thromb Haem 2006;4:295-306.   Marland Kitchen Homocysteine 08/08/2016 11.5  0.0  - 15.0 umol/L Final   Comment: (NOTE) Performed At: [facility] [place], [place] [id number] Lindon Romp MD MB:[id number]   . Recommendations-F5LEID: 08/08/2016 Comment   Final   Comment: (NOTE) Result:  Negative (no mutation found) Factor V Leiden is a specific mutation ([id number]) in the factor V gene that is associated with an increased risk of venous thrombosis. Factor V Leiden is more resistant to inactivation by activated protein C.  As a result, factor V persists in the circulation leading to a mild hyper- coagulable state.  The Leiden mutation accounts for 90% - 95% of APC resistance.  Factor V Leiden has been reported in patients with deep vein thrombosis, pulmonary embolus, central retinal vein occlusion, cerebral sinus thrombosis and hepatic vein thrombosis. Other risk factors to be considered in the workup for venous thrombosis include the [id number] mutation in the factor II (prothrombin) gene, protein S and C deficiency, and antithrombin deficiencies. Anticardiolipin antibody and lupus anticoagulant analysis may be appropriate for certain patients, as well as homocysteine levels. Contact your local [facility] for information on how to order additi                          onal testing if desired. **Genetic counselors are available for health care providers to**  discuss results at [phone]). Methodology: DNA analysis of the Factor V gene was performed  by allele-specific PCR. The diagnostic sensitivity and specificity is >99% for both. Molecular-based testing is highly accurate, but as in any laboratory test, diagnostic errors may occur. All test results must be combined with clinical information for the most accurate interpretation. This test was developed and its performance characteristics determined by [facility]. It has not been cleared or approved by the Food and Drug Administration. References: Voelkerding K (1996).  Clin Lab Med  [phone]. Allison Quarry, PhD, [facility] Ruben Reason, PhD, [facility] Annetta Maw, M.S., PhD, [facility] Alfredo Bach, PhD, [facility] Norva Riffle, PhD, [facility] Earlean Polka PhD, [facility] Performed At: [facility] [address] [place], [place] [id number] Nechama Guard MD ON:[id number]                          2   . Recommendations-PTGENE: 08/08/2016 Comment   Final   Comment: (NOTE) NEGATIVE No mutation identified. Comment: A point mutation ([id number]) in the factor II (prothrombin) gene is the second most common cause of inherited thrombophilia. The incidence of this mutation in the [place] Caucasian population is about 2% and in the [place] American population it is approximately 0.5%. This mutation is rare in the [place] and Native American population. Being heterozygous for a prothrombin mutation increases the risk for developing venous thrombosis about 2 to 3 times above the general population risk. Being homozygous for the prothrombin gene mutation increases the relative risk for venous thrombosis further, although it is not yet known how much further the risk is increased. In women heterozygous for the prothrombin gene mutation, the use of estrogen containing oral contraceptives increases the relative risk of venous thrombosis about 16 times and the risk of developing cerebral thrombosis is also significantly increased. In pregnancy the pr                          othrombin gene mutation increases risk for venous thrombosis and may increase risk for stillbirth, placental abruption, pre-eclampsia and fetal growth restriction. If the patient possesses two or more congenital or acquired thrombophilic risk factors, the risk for thrombosis may rise to more than the sum of the risk ratios for the individual mutations. This assay detects only the prothrombin G20210A mutation and does not measure genetic abnormalities elsewhere in the genome. Other thrombotic risk factors may be  pursued through systematic clinical laboratory analysis. These factors include the R506Q (Leiden) mutation in the Factor V gene, plasma homocysteine levels, as well as testing for deficiencies of antithrombin III, protein C and protein S. Genetic Counselors are available for health care providers to discuss results at [phone]). Methodology: DNA analysis of the Factor II gene was performed by PCR amplification followed by restriction analysis. The di                          agnostic sensitivity is >99% for both. All the tests must be combined with clinical information for the most accurate interpretation. Molecular-based testing is highly accurate, but as in any laboratory test, diagnostic errors may occur. This test was developed and its performance characteristics determined by [facility]. It has not been cleared or approved by the Food and Drug Administration. Poort SR, et al. Blood. 1996; 02:7253-6644. Varga EA. Circulation. 2004; 034:V42-V95. Mervin Hack, et [place]; 19:700-703. Allison Quarry, PhD, [facility] Ruben Reason, PhD, [facility] Annetta Maw, M.S., PhD, [facility] Estill Batten  Jannifer Franklin, PhD, [facility] Norva Riffle, PhD, [facility] Earlean Polka, PhD, [facility] Performed At: [facility] [address] [place], [place] [id number] Nechama Guard MD [id number]   . Anticardiolipin IgG 08/08/2016 <9  0 - 14 GPL U/mL Final   Comment: (NOTE)                          Negative:              <15                          Indeterminate:     15 - 20                          Low-Med Positive: >20 - 80                          High Positive:         >80   . Anticardiolipin IgM 08/08/2016 <9  0 - 12 MPL U/mL Final   Comment: (NOTE)                          Negative:              <13                          Indeterminate:     13 - 20                          Low-Med Positive: >20 - 80                          High Positive:         >80   .  Anticardiolipin IgA 08/08/2016 <9  0 - 11 APL U/mL Final   Comment: (NOTE)                          Negative:              <12                          Indeterminate:     12 - 20                          Low-Med Positive: >20 - 80                          High Positive:         >80 Performed At: [facility] [place], [place] [id number] Lindon Romp MD [id number]   . Glucose-Capillary 08/08/2016 158* 65 - 99 mg/dL Final  . Vitamin B-12 08/08/2016 273  180 - 914 pg/mL Final   Comment: (NOTE) This assay is not validated for testing neonatal or myeloproliferative syndrome specimens for Vitamin B12 levels. Performed at [facility], [place] [address] [place], [place] [employer] [address]   . Copper 08/08/2016 107  72 - 166 ug/dL Final   Comment: (NOTE)  Detection Limit = 5 Performed At: [facility] [place], [place] [id number] Lindon Romp MD [id number]   . TSH 08/08/2016 0.253* 0.350 - 4.500 uIU/mL Final   Performed by a 3rd Generation assay with a functional sensitivity of <=0.01 uIU/mL.  Marland Kitchen Arsenic 08/08/2016 8  2 - 23 ug/L Final                                   Detection Limit = 1  . Mercury 08/08/2016 None Detected  0.0 - 14.9 ug/L Final   Comment: (NOTE)                        Environmental Exposure:  <15.0                        Occupational Exposure:                         BEI - Inorganic Mercury: 15.0                                Detection Limit =  1.0 Performed At: [facility] [place], [place] [id number] Lindon Romp MD [id number]   . Lead 08/08/2016 2  0 - 19 ug/dL Final   Comment: (NOTE) **Effective August 28, 2016 the reference interval**  for Lead, Blood (Adult) will be changing to: 0 - 4                          Environmental Exposure:                           WHO Recommendation    <20                          Occupational Exposure:                            OSHA Lead Std          40                           BEI                    30                                Detection Limit =  1   . RPR Ser Ql 08/08/2016 Non Reactive  Non Reactive Final   Comment: (NOTE) Performed At: [facility] [place], [place] [id number] Lindon Romp MD [id number]   . Sed Rate 08/08/2016 8  0 - 20 mm/hr Final  . Glucose-Capillary 08/08/2016 149* 65 - 99 mg/dL Final  . Sodium 08/09/2016 137  135 - 145 mmol/L Final  . Potassium 08/09/2016 4.1  3.5 - 5.1 mmol/L Final  . Chloride 08/09/2016 104  101 - 111 mmol/L Final  . CO2 08/09/2016 26  22 - 32 mmol/L Final  . Glucose, Bld 08/09/2016 108* 65 - 99 mg/dL Final  .  BUN 08/09/2016 25* 6 - 20 mg/dL Final  . Creatinine, Ser 08/09/2016 1.06  0.61 - 1.24 mg/dL Final  . Calcium 08/09/2016 9.0  8.9 - 10.3 mg/dL Final  . GFR calc non Af Amer 08/09/2016 >60  >60 mL/min Final  . GFR calc Af Amer 08/09/2016 >60  >60 mL/min Final   Comment: (NOTE) The eGFR has been calculated using the CKD EPI equation. This calculation has not been validated in all clinical situations. eGFR's persistently <60 mL/min signify possible Chronic Kidney Disease.   . Anion gap 08/09/2016 7  5 - 15 Final  . Free T4 08/09/2016 0.83  0.61 - 1.12 ng/dL Final   Comment: (NOTE) Biotin ingestion may interfere with free T4 tests. If the results are inconsistent with the TSH level, previous test results, or the clinical presentation, then consider biotin interference. If needed, order repeat testing after stopping biotin.     RADIOGRAPHIC STUDIES: I have personally reviewed the radiological images as listed and agree with the findings in the report 08/08/2016 IMPRESSION: 1. 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 No CT evidence of right heart straining. 2. Multi vessel coronary vascular calcification and CABG  surgery changes. ASSESSMENT/PLAN 1. Other acute pulmonary embolism without acute cor pulmonale ([place])   2. Microcytosis   3. Chronic anticoagulation   4. PAF (paroxysmal atrial fibrillation) ([place])   5. Iron deficiency anemia, unspecified iron deficiency anemia type    Hypercoagulable work up discussed with patient. He is tested negative for factor 5 leiden mutation, prothrombin mutation, anti phospholipin antibody panels. Normal protein C activity. Normal total protein S, mildly low protein S function, can be falsely low in the acute setting of thrombosis.Congential protein S deficiency is unlikely given that he has no prior history of VTE.   I will hold additional work up for now. Continue Evalina Field. Will recheck free protein S level in 3 months.  Noted that he has microcytosis, iron TIBC and ferritin was added on, and showed iron deficiency. Occult stool card x 3. Plan oral ferrous sulfate '325mg'$  TID.  UA is negative for microscopic hematuria.   Follow up in 3 months.  Orders Placed This Encounter  Procedures  . CBC with Differential/Platelet    Standing Status:   Future    Number of Occurrences:   1    Standing Expiration Date:   08/31/2017  . Iron and TIBC    Standing Status:   Future    Number of Occurrences:   1    Standing Expiration Date:   08/31/2017  . Ferritin    Standing Status:   Future    Number of Occurrences:   1    Standing Expiration Date:   08/31/2017  . CBC with Differential/Platelet    Standing Status:   Future    Standing Expiration Date:   08/31/2017  . Protein S, total and free    Standing Status:   Future    Standing Expiration Date:   08/31/2017  . Urinalysis, Complete w Microscopic  . Occult blood card to lab, stool    Standing Status:   Future    Standing Expiration Date:   08/31/2017  . Occult blood card to lab, stool    Standing Status:   Future    Standing Expiration Date:   08/31/2017  . Occult blood card to lab, stool    Standing Status:   Future     Standing  Expiration Date:   08/31/2017    All questions were answered. The patient knows to call the clinic with any problems, questions or concerns.    Earlie Server, MD  08/31/2016 3:57 PM

## 2016-09-01 ENCOUNTER — Telehealth: Payer: Self-pay | Admitting: *Deleted

## 2016-09-01 DIAGNOSIS — D509 Iron deficiency anemia, unspecified: Secondary | ICD-10-CM | POA: Diagnosis not present

## 2016-09-01 NOTE — Telephone Encounter (Signed)
-----   Message from Earlie Server, MD sent at 08/31/2016 10:58 PM EDT ----- Please cal patient and let him know that he has iron deficiency. I ordered ferrous sulfate, sent to his pharmacy. Thx.

## 2016-09-01 NOTE — Telephone Encounter (Signed)
Left v/m notifying patient.

## 2016-09-02 DIAGNOSIS — D509 Iron deficiency anemia, unspecified: Secondary | ICD-10-CM | POA: Diagnosis not present

## 2016-09-03 DIAGNOSIS — D509 Iron deficiency anemia, unspecified: Secondary | ICD-10-CM | POA: Diagnosis not present

## 2016-09-04 ENCOUNTER — Other Ambulatory Visit: Payer: Self-pay

## 2016-09-04 DIAGNOSIS — I25709 Atherosclerosis of coronary artery bypass graft(s), unspecified, with unspecified angina pectoris: Secondary | ICD-10-CM | POA: Diagnosis not present

## 2016-09-04 DIAGNOSIS — I110 Hypertensive heart disease with heart failure: Secondary | ICD-10-CM | POA: Diagnosis not present

## 2016-09-04 DIAGNOSIS — Z7901 Long term (current) use of anticoagulants: Secondary | ICD-10-CM

## 2016-09-04 DIAGNOSIS — I2699 Other pulmonary embolism without acute cor pulmonale: Secondary | ICD-10-CM

## 2016-09-04 DIAGNOSIS — R718 Other abnormality of red blood cells: Secondary | ICD-10-CM

## 2016-09-04 DIAGNOSIS — I480 Paroxysmal atrial fibrillation: Secondary | ICD-10-CM | POA: Diagnosis not present

## 2016-09-04 DIAGNOSIS — T8132XD Disruption of internal operation (surgical) wound, not elsewhere classified, subsequent encounter: Secondary | ICD-10-CM | POA: Diagnosis not present

## 2016-09-04 LAB — OCCULT BLOOD X 1 CARD TO LAB, STOOL
FECAL OCCULT BLD: POSITIVE — AB
FECAL OCCULT BLD: NEGATIVE
Fecal Occult Bld: NEGATIVE

## 2016-09-06 ENCOUNTER — Other Ambulatory Visit: Payer: Self-pay | Admitting: *Deleted

## 2016-09-06 ENCOUNTER — Telehealth: Payer: Self-pay | Admitting: Oncology

## 2016-09-06 DIAGNOSIS — D509 Iron deficiency anemia, unspecified: Secondary | ICD-10-CM

## 2016-09-06 NOTE — Telephone Encounter (Signed)
Follow up appt time adjusted to accommodate Lab & Infusion appts add on per Julie/schd msg.. MF Patient notified.

## 2016-09-06 NOTE — Telephone Encounter (Signed)
Labs with poss Feraheme added to previous f/u appointment on 09/11/16,  per 09/06/16 schd msg/Julie. Appts conf with patient's wife. MF

## 2016-09-07 DIAGNOSIS — I5022 Chronic systolic (congestive) heart failure: Secondary | ICD-10-CM | POA: Diagnosis not present

## 2016-09-07 DIAGNOSIS — I110 Hypertensive heart disease with heart failure: Secondary | ICD-10-CM | POA: Diagnosis not present

## 2016-09-07 DIAGNOSIS — Z7901 Long term (current) use of anticoagulants: Secondary | ICD-10-CM | POA: Diagnosis not present

## 2016-09-07 DIAGNOSIS — T8132XD Disruption of internal operation (surgical) wound, not elsewhere classified, subsequent encounter: Secondary | ICD-10-CM | POA: Diagnosis not present

## 2016-09-07 DIAGNOSIS — Z87891 Personal history of nicotine dependence: Secondary | ICD-10-CM | POA: Diagnosis not present

## 2016-09-07 DIAGNOSIS — I480 Paroxysmal atrial fibrillation: Secondary | ICD-10-CM | POA: Diagnosis not present

## 2016-09-07 DIAGNOSIS — R69 Illness, unspecified: Secondary | ICD-10-CM | POA: Diagnosis not present

## 2016-09-07 DIAGNOSIS — Z951 Presence of aortocoronary bypass graft: Secondary | ICD-10-CM | POA: Diagnosis not present

## 2016-09-07 DIAGNOSIS — I25709 Atherosclerosis of coronary artery bypass graft(s), unspecified, with unspecified angina pectoris: Secondary | ICD-10-CM | POA: Diagnosis not present

## 2016-09-11 ENCOUNTER — Inpatient Hospital Stay: Payer: Medicare HMO

## 2016-09-11 ENCOUNTER — Encounter: Payer: Self-pay | Admitting: Oncology

## 2016-09-11 ENCOUNTER — Emergency Department: Payer: Medicare HMO

## 2016-09-11 ENCOUNTER — Inpatient Hospital Stay (HOSPITAL_BASED_OUTPATIENT_CLINIC_OR_DEPARTMENT_OTHER): Payer: Medicare HMO | Admitting: Oncology

## 2016-09-11 ENCOUNTER — Other Ambulatory Visit: Payer: Self-pay | Admitting: Oncology

## 2016-09-11 ENCOUNTER — Encounter: Payer: Self-pay | Admitting: Emergency Medicine

## 2016-09-11 ENCOUNTER — Other Ambulatory Visit: Payer: Self-pay

## 2016-09-11 ENCOUNTER — Emergency Department
Admission: EM | Admit: 2016-09-11 | Discharge: 2016-09-11 | Disposition: A | Discharge status: Home / Self Care | Payer: Medicare HMO | Attending: Emergency Medicine | Admitting: Emergency Medicine

## 2016-09-11 VITALS — BP 170/94 | HR 61 | Temp 96.4°F | Resp 20

## 2016-09-11 VITALS — BP 107/64 | HR 56 | Temp 93.4°F | Resp 18 | Wt 173.2 lb

## 2016-09-11 DIAGNOSIS — I2699 Other pulmonary embolism without acute cor pulmonale: Secondary | ICD-10-CM | POA: Diagnosis not present

## 2016-09-11 DIAGNOSIS — D509 Iron deficiency anemia, unspecified: Secondary | ICD-10-CM

## 2016-09-11 DIAGNOSIS — T50905A Adverse effect of unspecified drugs, medicaments and biological substances, initial encounter: Secondary | ICD-10-CM

## 2016-09-11 DIAGNOSIS — I5040 Unspecified combined systolic (congestive) and diastolic (congestive) heart failure: Secondary | ICD-10-CM | POA: Diagnosis not present

## 2016-09-11 DIAGNOSIS — I110 Hypertensive heart disease with heart failure: Secondary | ICD-10-CM | POA: Insufficient documentation

## 2016-09-11 DIAGNOSIS — D500 Iron deficiency anemia secondary to blood loss (chronic): Secondary | ICD-10-CM

## 2016-09-11 DIAGNOSIS — R079 Chest pain, unspecified: Secondary | ICD-10-CM

## 2016-09-11 DIAGNOSIS — R0789 Other chest pain: Secondary | ICD-10-CM | POA: Insufficient documentation

## 2016-09-11 DIAGNOSIS — R0602 Shortness of breath: Secondary | ICD-10-CM | POA: Insufficient documentation

## 2016-09-11 DIAGNOSIS — Z87891 Personal history of nicotine dependence: Secondary | ICD-10-CM | POA: Diagnosis not present

## 2016-09-11 DIAGNOSIS — K5730 Diverticulosis of large intestine without perforation or abscess without bleeding: Secondary | ICD-10-CM

## 2016-09-11 DIAGNOSIS — E611 Iron deficiency: Secondary | ICD-10-CM | POA: Insufficient documentation

## 2016-09-11 DIAGNOSIS — Z79899 Other long term (current) drug therapy: Secondary | ICD-10-CM | POA: Diagnosis not present

## 2016-09-11 DIAGNOSIS — I480 Paroxysmal atrial fibrillation: Secondary | ICD-10-CM

## 2016-09-11 DIAGNOSIS — Z7901 Long term (current) use of anticoagulants: Secondary | ICD-10-CM | POA: Insufficient documentation

## 2016-09-11 DIAGNOSIS — T454X5A Adverse effect of iron and its compounds, initial encounter: Secondary | ICD-10-CM | POA: Diagnosis not present

## 2016-09-11 DIAGNOSIS — I25110 Atherosclerotic heart disease of native coronary artery with unstable angina pectoris: Secondary | ICD-10-CM | POA: Diagnosis not present

## 2016-09-11 HISTORY — DX: Iron deficiency anemia secondary to blood loss (chronic): D50.0

## 2016-09-11 LAB — COMPREHENSIVE METABOLIC PANEL
ALBUMIN: 3.8 g/dL (ref 3.5–5.0)
ALK PHOS: 49 U/L (ref 38–126)
ALT: 18 U/L (ref 17–63)
ANION GAP: 6 (ref 5–15)
AST: 22 U/L (ref 15–41)
BUN: 16 mg/dL (ref 6–20)
CALCIUM: 9.1 mg/dL (ref 8.9–10.3)
CO2: 28 mmol/L (ref 22–32)
CREATININE: 1.21 mg/dL (ref 0.61–1.24)
Chloride: 107 mmol/L (ref 101–111)
GFR calc Af Amer: >60 mL/min (ref >60)
GFR calc non Af Amer: 57 mL/min — ABNORMAL LOW (ref >60)
GLUCOSE: 126 mg/dL — AB (ref 65–99)
Potassium: 4.3 mmol/L (ref 3.5–5.1)
SODIUM: 141 mmol/L (ref 135–145)
Total Bilirubin: 1.1 mg/dL (ref 0.3–1.2)
Total Protein: 6.9 g/dL (ref 6.5–8.1)

## 2016-09-11 LAB — CBC WITH DIFFERENTIAL/PLATELET
Basophils Absolute: 0 10*3/uL (ref 0–0.1)
Basophils Relative: 1 %
EOS PCT: 2 %
Eosinophils Absolute: 0.1 10*3/uL (ref 0–0.7)
HCT: 35.5 % — ABNORMAL LOW (ref 40.0–52.0)
Hemoglobin: 11.8 g/dL — ABNORMAL LOW (ref 13.0–18.0)
LYMPHS ABS: 1.5 10*3/uL (ref 1.0–3.6)
LYMPHS PCT: 36 %
MCH: 25.4 pg — AB (ref 26.0–34.0)
MCHC: 33.2 g/dL (ref 32.0–36.0)
MCV: 76.5 fL — AB (ref 80.0–100.0)
MONO ABS: 0.5 10*3/uL (ref 0.2–1.0)
MONOS PCT: 11 %
Neutro Abs: 2 10*3/uL (ref 1.4–6.5)
Neutrophils Relative %: 50 %
PLATELETS: 258 10*3/uL (ref 150–440)
RBC: 4.64 MIL/uL (ref 4.40–5.90)
RDW: 17.5 % — ABNORMAL HIGH (ref 11.5–14.5)
WBC: 4 10*3/uL (ref 3.8–10.6)

## 2016-09-11 LAB — CBC
HEMATOCRIT: 37 % — AB (ref 40.0–52.0)
HEMOGLOBIN: 12.3 g/dL — AB (ref 13.0–18.0)
MCH: 25.5 pg — ABNORMAL LOW (ref 26.0–34.0)
MCHC: 33.3 g/dL (ref 32.0–36.0)
MCV: 76.7 fL — ABNORMAL LOW (ref 80.0–100.0)
Platelets: 259 10*3/uL (ref 150–440)
RBC: 4.82 MIL/uL (ref 4.40–5.90)
RDW: 17.4 % — ABNORMAL HIGH (ref 11.5–14.5)
WBC: 4.4 10*3/uL (ref 3.8–10.6)

## 2016-09-11 LAB — TROPONIN I
Troponin I: <0.03 ng/mL (ref <0.03)
Troponin I: <0.03 ng/mL (ref <0.03)

## 2016-09-11 MED ORDER — METHYLPREDNISOLONE SODIUM SUCC 125 MG IJ SOLR
40.0000 mg | Freq: Once | INTRAMUSCULAR | Status: AC
  Administered 2016-09-11: 40 mg via INTRAVENOUS

## 2016-09-11 MED ORDER — SODIUM CHLORIDE 0.9 % IV SOLN
Freq: Once | INTRAVENOUS | Status: AC
  Administered 2016-09-11: 12:00:00 via INTRAVENOUS
  Filled 2016-09-11: qty 1000

## 2016-09-11 MED ORDER — SODIUM CHLORIDE 0.9 % IV SOLN
510.0000 mg | Freq: Once | INTRAVENOUS | Status: AC
  Administered 2016-09-11: 510 mg via INTRAVENOUS
  Filled 2016-09-11: qty 17

## 2016-09-11 MED ORDER — METHYLPREDNISOLONE SODIUM SUCC 40 MG IJ SOLR
40.0000 mg | Freq: Once | INTRAMUSCULAR | 0 refills | Status: AC
Start: 2016-09-11 — End: 2016-09-11

## 2016-09-11 MED ORDER — FAMOTIDINE IN NACL 20-0.9 MG/50ML-% IV SOLN
20.0000 mg | Freq: Two times a day (BID) | INTRAVENOUS | Status: DC
  Administered 2016-09-11: 20 mg via INTRAVENOUS

## 2016-09-11 MED ORDER — DIPHENHYDRAMINE HCL 50 MG/ML IJ SOLN
25.0000 mg | Freq: Once | INTRAMUSCULAR | Status: AC
  Administered 2016-09-11: 25 mg via INTRAVENOUS

## 2016-09-11 NOTE — Discharge Instructions (Signed)
Your symptoms today appear to be due to an adverse reaction to the iron infusion.  Follow up with your doctors for further management of your anemia.

## 2016-09-11 NOTE — Progress Notes (Signed)
Here for follow up. Needs renewal of Zoloft 25 mg. Stated energy level very poor after lunch. Diff sleeping. SOB in pm as well per pt  Pulse ox 98 % on RA -sitting.

## 2016-09-11 NOTE — ED Notes (Signed)
Pt. Verbalizes understanding of d/c instructions and follow-up. VS stable and pain controlled per pt.  Pt. In NAD at time of d/c and denies further concerns regarding this visit. Pt. Stable at the time of departure from the unit, departing unit by the safest and most appropriate manner per that pt condition and limitations. Pt advised to return to the ED at any time for emergent concerns, or for new/worsening symptoms.   

## 2016-09-11 NOTE — Progress Notes (Signed)
Upon completion of Feraheme infusion allowed pt to stay for monitoring. Upon reassessment found pt to be SOB and reporting "chest tightness", MD notified and VS taken. Pt with increased BP and notable work of breathing, IV access regained, vitals monitored and medications administered per orders/MD recs. Spouse remains at chairside. Upon further questioning pt reports that the "chest tightness started right after iron was done", however, did not report it to this writer because he "thought it was nothing". Pt RE_educated on importance of notifying staff of ANY changes during any tx, pt and spouse verbalize understanding. Pt remains in chair at this time, O2 and VS monitored.  1410: Pt continues to report CP and SOB, pt agreeable to go to ED, transported to ED via w/c by this Probation officer and fellow RN.

## 2016-09-11 NOTE — ED Triage Notes (Signed)
States had chest pain and SOB after Iron infusion in cancer center today. States is intermittent in intensity and spasmlike when worse.

## 2016-09-11 NOTE — ED Provider Notes (Signed)
[facility] Emergency Department Provider Note  ____________________________________________  Time seen: Approximately 5:34 PM  I have reviewed the triage vital signs and the nursing notes.   HISTORY  Chief Complaint Chest Pain    HPI Scott [name] is a 76 y.o. male reports he was in his usual state of health and then he went to the infusion center today for an iron infusion due to chronic anemia, and after the infusion he developed chest tightness and shortness of breath.No abdominal pain, no vomiting or diarrhea. No skin rash, no wheezing. No throat swelling or tongue swelling. In terms continued for approximately one hour, and resolved while waiting for evaluation in the ED. No exertional or pleuritic symptoms. No aggravating or alleviating factors. No radiation to the chest pain which is anterior. Symptoms were moderate intensity when present. Now resolved.     Past Medical History:  Diagnosis Date  . Arthritis    right hip, since a fall  . CAD (coronary artery disease)    a. 04/2012 Cath: 3VD->Med Rx;  b. 04/2013 PCI RCA (2 DES); c. 03/2014 PCI: LAD 80 (3.0x23 Xience [place] DES), 30 ISR in RCA; d. 06/2014 CABG x 2 ([facility]) LIMA->LAD, VG->OM; e. 11/2014 Neg MV;  f. 12/2014 Cath ([facility]): patent grafts->Med Rx; g. 03/2015 MV ([facility]) low risk, EF 40%; h. 05/2015 MV: low risk; i. 08/2015 Cath: patent grafts, patent RCA/LAD stents, small LCX/OM->Med Rx.  . Cardiomyopathy, ischemic    a. 04/2012 Echo: EF 40-45%;  b. 08/2013 Echo: EF 45-50%, mild glob HK, mod lat/post HK, diast dysfxn, mildly dil LA, mild MR, nl RVSP; c. 01/2015 Echo: EF 40-45%, Gr 1 DD, mildly dil LA.  . Carotid arterial disease ([place])    a. 05/2013 Carotid U/S; bilat 40-50% ICA stenosis.  . Chronic Chest Pain   . Chronic systolic CHF (congestive heart failure) ([place])    a. 01/2015 Echo: EF 40-45%.  . Cough   . Dyspnea    pt. unsure of exertion cause- that creates SOB at times   . Headache 1970's   migraine   . History of blood transfusion 2016   post op  . Hyperlipidemia   . Hypertension   . Iron deficiency anemia due to chronic blood loss 09/11/2016  . Myocardial infarction [facility])    unsure of when     Patient Active Problem List   Diagnosis Date Noted  . Iron deficiency anemia due to chronic blood loss 09/11/2016  . Pulmonary embolus ([place]) 08/31/2016  . Panic disorder   . 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 Panic attack 08/04/2016  . 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 Coronary artery disease involving native coronary artery of native heart with angina pectoris with documented spasm ([place])   . Hx of CABG   . Ischemic cardiomyopathy   . Chronic systolic CHF (congestive heart failure) ([place])   . Chronic Chest Pain   . Coronary artery disease involving coronary bypass graft of native heart with angina pectoris with documented spasm ([place])   . Angina pectoris ([place])   . Anxiety   . Hyperlipemia   . Chronic diastolic heart failure ([place]) 12/21/2014  . Unstable angina ([place]) 12/08/2014  . PAF (paroxysmal atrial fibrillation) ([place]) 12/08/2014  . Chronic anticoagulation 12/08/2014  . Anemia 04/20/2014  . 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 Cardiomyopathy, ischemic   . HTN (hypertension)   . Carotid arterial disease ([place])   . S/P drug eluting coronary stent placement 05/30/2013  . Chest pain  with low risk for cardiac etiology 05/05/2013  . SOB (shortness of breath) 05/05/2013  . Coronary artery disease involving coronary bypass graft of native heart with angina pectoris ([place]) 05/05/2013  . Hyperlipidemia 05/05/2013  . History of smoking 05/05/2013  . Cardiac angina ([place]) 05/05/2013     Past Surgical History:  Procedure Laterality Date  . CARDIAC CATHETERIZATION  05/01/2012  . CARDIAC CATHETERIZATION  04/2013   [facility];x3 stent  . CARDIAC CATHETERIZATION  01/11/15    [facility]  . CARDIAC CATHETERIZATION N/A 09/16/2015   Procedure: LEFT HEART CATH AND CORS/GRAFTS ANGIOGRAPHY;   Surgeon: Minna Merritts, MD;  Location: [place] CV LAB;  Service: Cardiovascular;  Laterality: N/A;  . CARPAL TUNNEL RELEASE     right hand  . CATARACT EXTRACTION     LEFT  . CATARACT EXTRACTION W/PHACO Left 09/16/2014   Procedure: CATARACT EXTRACTION PHACO AND INTRAOCULAR LENS PLACEMENT (IOC);  Surgeon: Leandrew Koyanagi, MD;  Location: [place];  Service: Ophthalmology;  Laterality: Left;  . COLONOSCOPY    . COLONOSCOPY WITH PROPOFOL N/A 01/28/2016   Procedure: COLONOSCOPY WITH PROPOFOL;  Surgeon: Lollie Sails, MD;  Location: [facility] ENDOSCOPY;  Service: Endoscopy;  Laterality: N/A;  . CORONARY ANGIOPLASTY WITH STENT PLACEMENT  04/13/2014  . CORONARY ARTERY BYPASS GRAFT  06-26-14   x3 bypasses  . DE QUERVAIN'S RELEASE Left 08/22/2012  . ESOPHAGOGASTRODUODENOSCOPY (EGD) WITH PROPOFOL N/A 04/26/2015   Procedure: ESOPHAGOGASTRODUODENOSCOPY (EGD) WITH PROPOFOL;  Surgeon: Hulen Luster, MD;  Location[facility] ENDOSCOPY;  Service: Gastroenterology;  Laterality: N/A;  . RIB PLATING N/A 05/25/2016   Procedure: STERNAL PLATING;  Surgeon: Melrose Nakayama, MD;  Location: [place];  Service: Thoracic;  Laterality: N/A;  . right shoulder    . STERNAL WIRES REMOVAL N/A 03/30/2016   Procedure: STERNAL WIRES REMOVAL;  Surgeon: Melrose Nakayama, MD;  Location: [place];  Service: Thoracic;  Laterality: N/A;  . TONSILLECTOMY       Prior to Admission medications   Medication Sig Start Date End Date Taking? Authorizing Provider  ALPRAZolam Duanne Moron) 0.5 MG tablet Take 1 tablet (0.5 mg total) by mouth 3 (three) times daily as needed for sleep or anxiety. 08/06/16 08/06/17 Yes Epifanio Lesches, MD  atorvastatin (LIPITOR) 80 MG tablet Take 80 mg by mouth at bedtime.   Yes [provider]  carvedilol (COREG) 3.125 MG tablet Take 1 tablet (3.125 mg total) by mouth daily before breakfast. 05/15/16  Yes Gollan, [name], MD  isosorbide mononitrate (IMDUR) 60 MG 24 hr tablet Take 60 mg by mouth  daily before breakfast. Before breakfast    Yes [provider]  methylPREDNISolone sodium succinate (SOLU-MEDROL) 40 mg/mL injection Inject 1 mL (40 mg total) into the vein once. 09/11/16 09/11/16 Yes Earlie Server, MD  traMADol (ULTRAM) 50 MG tablet Take 1-2 tablets (50-100 mg total) by mouth every 6 (six) hours as needed for moderate pain. 06/30/16  Yes Melrose Nakayama, MD  XARELTO 20 MG TABS tablet Take 20 mg by mouth daily. 08/28/16  Yes [provider]  zolpidem (AMBIEN) 5 MG tablet Take 5 mg by mouth at bedtime as needed for sleep.    Yes [provider]  ferrous sulfate 325 (65 FE) MG EC tablet Take 1 tablet (325 mg total) by mouth 3 (three) times daily with meals. Patient not taking: Reported on 09/11/2016 08/31/16   Earlie Server, MD  nitroGLYCERIN (NITROSTAT) 0.4 MG SL tablet Place 1 tablet (0.4 mg total) under the tongue every 5 (  five) minutes as needed for chest pain. Patient not taking: Reported on 09/11/2016 03/29/15   Minna Merritts, MD  sertraline (ZOLOFT) 25 MG tablet Take 1 tablet (25 mg total) by mouth daily. Patient not taking: Reported on 09/11/2016 08/09/16 08/09/17  Gladstone Lighter, MD     Allergies Contrast media [iodinated diagnostic agents]; Oxycodone hcl; Tramadol; and Vicodin [hydrocodone-acetaminophen]   Family History  Problem Relation Age of Onset  . Heart attack Father 76       MI  . Cancer Maternal Uncle     Social History Social History  Substance Use Topics  . Smoking status: Former Smoker    Packs/day: 1.00    Years: 35.00    Types: Cigarettes    Start date: 05/02/2009  . Smokeless tobacco: Never Used     Comment: quit in 2006  . Alcohol use No    Review of Systems  Constitutional:   No fever or chills.  ENT:   No sore throat. No rhinorrhea. Cardiovascular:   Positive as above syncope. Respiratory:   Positive shortness of breath without cough Gastrointestinal:   Negative for abdominal pain, vomiting and diarrhea.   Musculoskeletal:   Negative for focal pain or swelling All other systems reviewed and are negative except as documented above in ROS and HPI.  ____________________________________________   PHYSICAL EXAM:  VITAL SIGNS: ED Triage Vitals  Enc Vitals Group     BP 09/11/16 1411 (!) 150/80     Pulse Rate 09/11/16 1411 63     Resp 09/11/16 1411 20     Temp 09/11/16 1411 99.3 F (37.4 C)     Temp Source 09/11/16 1411 Oral     SpO2 09/11/16 1411 100 %     Weight 09/11/16 1412 173 lb (78.5 kg)     Height 09/11/16 1412 5\' 10"  (1.778 m)     Head Circumference --      Peak Flow --      Pain Score 09/11/16 1411 6     Pain Loc --      Pain Edu? --      Excl. in [place]? --     Vital signs reviewed, nursing assessments reviewed.   Constitutional:   Alert and oriented. Well appearing and in no distress. Eyes:   No scleral icterus.  EOMI. No nystagmus. No conjunctival pallor. PERRL. ENT   Head:   Normocephalic and atraumatic.   Nose:   No congestion/rhinnorhea.    Mouth/Throat:   MMM, no pharyngeal erythema. No peritonsillar mass.    Neck:   No meningismus. Full ROM Hematological/Lymphatic/Immunilogical:   No cervical lymphadenopathy. Cardiovascular:   RRR. Symmetric bilateral radial and DP pulses.  No murmurs.  Respiratory:   Normal respiratory effort without tachypnea/retractions. Breath sounds are clear and equal bilaterally. No wheezes/rales/rhonchi. Gastrointestinal:   Soft and nontender. Non distended. There is no CVA tenderness.  No rebound, rigidity, or guarding. Genitourinary:   deferred Musculoskeletal:   Normal range of motion in all extremities. No joint effusions.  No lower extremity tenderness.  No edema. Neurologic:   Normal speech and language.  Motor grossly intact. No gross focal neurologic deficits are appreciated.  Skin:    Skin is warm, dry and intact. No rash noted.  No petechiae, purpura, or bullae.  ____________________________________________     LABS (pertinent positives/negatives) (all labs ordered are listed, but only abnormal results are displayed) Labs Reviewed  CBC - Abnormal; Notable for the following:       Result Value  Hemoglobin 12.3 (*)    HCT 37.0 (*)    MCV 76.7 (*)    MCH 25.5 (*)    RDW 17.4 (*)    All other components within normal limits  COMPREHENSIVE METABOLIC PANEL - Abnormal; Notable for the following:    Glucose, Bld 126 (*)    GFR calc non Af Amer 57 (*)    All other components within normal limits  TROPONIN I  TROPONIN I   ____________________________________________   EKG  Interpreted by me Normal sinus rhythm rate of 61, left axis, normal intervals. Slightly widened QRS, no acute ischemic changes.  ____________________________________________    RADIOLOGY  No results found.  ____________________________________________   PROCEDURES Procedures  ____________________________________________   INITIAL IMPRESSION / ASSESSMENT AND PLAN / ED COURSE  Pertinent labs & imaging results that were available during my care of the patient were reviewed by me and considered in my medical decision making (see chart for details).  Patient well appearing no acute distress, presents with an episode of chest pain and shortness of breath that started on the patient was at the infusion center this morning receiving an iron infusion. That was at approximately 11:00 AM today. Since being in the ED waiting room, his symptoms have resolved. In the treatment room he is a symptomatically feels back to normal. Vital signs are unremarkable. We'll recheck his troponin make sure there are no interval changes, check a chest x-ray to make sure there are no signs of edema. Exam is reassuring, anticipate patient will be suitable for discharge home and further management of his anemia by his PCP and oncologist.      ____________________________________________   FINAL CLINICAL IMPRESSION(S) / ED  DIAGNOSES  Final diagnoses:  Nonspecific chest pain  Adverse effect of drug, initial encounter      New Prescriptions   No medications on file     Portions of this note were generated with dragon dictation software. Dictation errors may occur despite best attempts at proofreading.    Carrie Mew, MD 09/11/16 [phone]

## 2016-09-11 NOTE — Progress Notes (Addendum)
[facility] Initial Visit:  Patient Care Team: Madelyn Brunner, MD as PCP - General (Internal Medicine) Madelyn Brunner, MD (Internal Medicine) Alisa Graff, FNP as Nurse Practitioner (Cardiology) Minna Merritts, MD as Consulting Physician (Cardiology)  CHIEF COMPLAINTS/PURPOSE OF CONSULTATION: I don't like my iron pill.  HISTORY OF PRESENTING ILLNESS: Scott [name] 76 y.o. male with PMH listed as below who is referred to me for evaluation and management of pulmonary embolism.  GEARALD STONEBRAKER has a history paroxymal atrial fibrillation on Eliquis 5mg  BID until recently, history of CAD s/p CABG x 2 at [facility] in 2016 with chronic chest pain, s/p removal of sternal wire on 03/2016,  s/p sternal debridement and plating on 05/25/2016 in the setting of CT chest performed by pain management showing sternal nonunion of the manubrium with subsequent improvement in his pain. Perioperatively his anticoagulation with Eliquis was discontinue a few days prior surgery and resumed after surgery. Operation note mentioned that during the procedure, the left lung was densely adherent to the underside of the sternum and tear in the lung did occur and was sutured. He presents  to [facility] on 08/04/2016 for evaluation of SOB and was found to have a PE. Patient states that he was compliant with Eliquis.  CTA chest showed left upper lobe PE extending into the left main pulmonary artery. His Eliquis was changed to Xarelto. Lower extremity dopplers were negative for DVT. Hypercoagulable workup was sent and all tests were negative except a mild low protein S function.  No formal cancer workup.  Sed rate normal, heavy metals negative, TSH mildly reduced at 0.253. 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   He comes in doing well today. He does continue to note mild shortness of breath though feels like this is improving.  He is  tolerating Xarelto without any bleeding events or easy bruising. Has not noticed any melena, BRBPR, hematuria, hemoptysis or hematemesis. Denies fever/chills, weight loss, night sweats, abdominal pain or lower extremity swelling.   INTERVAL HISTORY Patient presents to reevaluate tolerance of oral iron supplementation and the lab results discussion. Patient informs me that he only took 2 days of iron pill and due to diarrhea and GI discomfort he stopped taking the iron supplementation. He continues to feel fatigued and shortness of breath. His anterior chest pain is intermittent and has mildly improved.   Review of Systems  Constitutional: Negative.   HENT:  Negative.   Eyes: Negative.   Respiratory: Positive for shortness of breath.        SOB with exertion.   Cardiovascular: Positive for chest pain.       Chronic Intermittent chest pain.  Gastrointestinal: Negative.   Endocrine: Negative.   Genitourinary: Negative.    Musculoskeletal: Negative.   Skin: Negative.     MEDICAL HISTORY: Past Medical History:  Diagnosis Date  . Arthritis    right hip, since a fall  . CAD (coronary artery disease)    a. 04/2012 Cath: 3VD->Med Rx;  b. 04/2013 PCI RCA (2 DES); c. 03/2014 PCI: LAD 80 (3.0x23 Xience Alpine DES), 30 ISR in RCA; d. 06/2014 CABG x 2 ([facility]) LIMA->LAD, VG->OM; e. 11/2014 Neg MV;  f. 12/2014 Cath ([facility]): patent grafts->Med Rx; g. 03/2015 MV ([facility]) low risk, EF 40%; h. 05/2015 MV: low risk; i. 08/2015 Cath: patent grafts, patent RCA/LAD stents, small LCX/OM->Med Rx.  . Cardiomyopathy, ischemic  a. 04/2012 Echo: EF 40-45%;  b. 08/2013 Echo: EF 45-50%, mild glob HK, mod lat/post HK, diast dysfxn, mildly dil LA, mild MR, nl RVSP; c. 01/2015 Echo: EF 40-45%, Gr 1 DD, mildly dil LA.  . Carotid arterial disease ([place])    a. 05/2013 Carotid U/S; bilat 40-50% ICA stenosis.  . Chronic Chest Pain   . Chronic systolic CHF (congestive heart failure) ([place])    a. 01/2015 Echo: EF 40-45%.  . Cough   .  Dyspnea    pt. unsure of exertion cause- that creates SOB at times   . Headache 1970's   migraine  . History of blood transfusion 2016   post op  . Hyperlipidemia   . Hypertension   . Myocardial infarction [facility])    unsure of when    SURGICAL HISTORY: Past Surgical History:  Procedure Laterality Date  . CARDIAC CATHETERIZATION  05/01/2012  . CARDIAC CATHETERIZATION  04/2013   [facility];x3 stent  . CARDIAC CATHETERIZATION  01/11/15    [facility]  . CARDIAC CATHETERIZATION N/A 09/16/2015   Procedure: LEFT HEART CATH AND CORS/GRAFTS ANGIOGRAPHY;  Surgeon: Minna Merritts, MD;  Location: [place] CV LAB;  Service: Cardiovascular;  Laterality: N/A;  . CARPAL TUNNEL RELEASE     right hand  . CATARACT EXTRACTION     LEFT  . CATARACT EXTRACTION W/PHACO Left 09/16/2014   Procedure: CATARACT EXTRACTION PHACO AND INTRAOCULAR LENS PLACEMENT (IOC);  Surgeon: Leandrew Koyanagi, MD;  Location: [place];  Service: Ophthalmology;  Laterality: Left;  . COLONOSCOPY    . COLONOSCOPY WITH PROPOFOL N/A 01/28/2016   Procedure: COLONOSCOPY WITH PROPOFOL;  Surgeon: Lollie Sails, MD;  Location: [facility] ENDOSCOPY;  Service: Endoscopy;  Laterality: N/A;  . CORONARY ANGIOPLASTY WITH STENT PLACEMENT  04/13/2014  . CORONARY ARTERY BYPASS GRAFT  06-26-14   x3 bypasses  . DE QUERVAIN'S RELEASE Left 08/22/2012  . ESOPHAGOGASTRODUODENOSCOPY (EGD) WITH PROPOFOL N/A 04/26/2015   Procedure: ESOPHAGOGASTRODUODENOSCOPY (EGD) WITH PROPOFOL;  Surgeon: Hulen Luster, MD;  Location: [facility] ENDOSCOPY;  Service: Gastroenterology;  Laterality: N/A;  . RIB PLATING N/A 05/25/2016   Procedure: STERNAL PLATING;  Surgeon: Melrose Nakayama, MD;  Location: [place];  Service: Thoracic;  Laterality: N/A;  . right shoulder    . STERNAL WIRES REMOVAL N/A 03/30/2016   Procedure: STERNAL WIRES REMOVAL;  Surgeon: Melrose Nakayama, MD;  Location: [place];  Service: Thoracic;  Laterality: N/A;  . TONSILLECTOMY      SOCIAL HISTORY: Social  History   Social History  . Marital status: Married    Spouse name: N/A  . Number of children: N/A  . Years of education: N/A   Occupational History  . Not on file.   Social History Main Topics  . Smoking status: Former Smoker    Packs/day: 1.00    Years: 35.00    Types: Cigarettes    Start date: 05/02/2009  . Smokeless tobacco: Never Used     Comment: quit in 2006  . Alcohol use No  . Drug use: No  . Sexual activity: Yes   Other Topics Concern  . Not on file   Social History Narrative  . No narrative on file    FAMILY HISTORY Family History  Problem Relation Age of Onset  . Heart attack Father 50       MI  . Cancer Maternal Uncle     ALLERGIES:  is allergic to contrast media [iodinated diagnostic agents]; oxycodone hcl; tramadol; and vicodin [hydrocodone-acetaminophen].  MEDICATIONS:  Current Outpatient Prescriptions  Medication Sig Dispense Refill  . ALPRAZolam (XANAX) 0.5 MG tablet Take 1 tablet (0.5 mg total) by mouth 3 (three) times daily as needed for sleep or anxiety. 30 tablet 0  . atorvastatin (LIPITOR) 80 MG tablet Take 80 mg by mouth at bedtime.    . carvedilol (COREG) 3.125 MG tablet Take 1 tablet (3.125 mg total) by mouth daily before breakfast. 90 tablet 3  . ferrous sulfate 325 (65 FE) MG EC tablet Take 1 tablet (325 mg total) by mouth 3 (three) times daily with meals. 90 tablet 3  . isosorbide mononitrate (IMDUR) 60 MG 24 hr tablet Take 60 mg by mouth daily before breakfast. Before breakfast     . nitroGLYCERIN (NITROSTAT) 0.4 MG SL tablet Place 1 tablet (0.4 mg total) under the tongue every 5 (five) minutes as needed for chest pain. 25 tablet 6  . sertraline (ZOLOFT) 25 MG tablet Take 1 tablet (25 mg total) by mouth daily. 30 tablet 1  . traMADol (ULTRAM) 50 MG tablet Take 1-2 tablets (50-100 mg total) by mouth every 6 (six) hours as needed for moderate pain. 30 tablet 0  . XARELTO 20 MG TABS tablet Take 20 mg by mouth daily.  1  . zolpidem (AMBIEN)  5 MG tablet Take 5 mg by mouth at bedtime as needed for sleep.      No current facility-administered medications for this visit.     PHYSICAL EXAMINATION:  ECOG PERFORMANCE STATUS: 1 - Symptomatic but completely ambulatory   Vitals:   09/11/16 1040  BP: 107/64  Pulse: (!) 56  Resp: 18  Temp: (!) 93.4 F (34.1 C)    Filed Weights   09/11/16 1040  Weight: 173 lb 3.2 oz (78.6 kg)     Physical Exam GENERAL: No distress, well nourished.  SKIN:  No rashes or significant lesions  HEAD: Normocephalic, No masses, lesions, tenderness or abnormalities  EYES: Conjunctiva are pink, non icteric ENT: External ears normal ,lips , buccal mucosa, and tongue normal and mucous membranes are moist  LYMPH: No palpable cervical and axillary lymphadenopathy  LUNGS: Clear to auscultation, no crackles or wheezes HEART: Regular rate & rhythm, no murmurs, no gallops, S1 normal and S2 normal  ABDOMEN: Abdomen soft, non-tender, normal bowel sounds, I did not appreciate any  masses or organomegaly  MUSCULOSKELETAL: No CVA tenderness and no tenderness on percussion of the back or rib cage.  EXTREMITIES: No edema, no skin discoloration or tenderness NEURO: Alert & oriented, no focal motor/sensory deficits.   LABORATORY DATA: I have personally reviewed the data as listed:  Orders Only on 09/04/2016  Component Date Value Ref Range Status  . Fecal Occult Bld 09/01/2016 POSITIVE* NEGATIVE Final  . Fecal Occult Bld 09/02/2016 NEGATIVE  NEGATIVE Final  . Fecal Occult Bld 09/03/2016 NEGATIVE  NEGATIVE Final  Appointment on 08/31/2016  Component Date Value Ref Range Status  . WBC 08/31/2016 4.5  3.8 - 10.6 K/uL Final  . RBC 08/31/2016 4.80  4.40 - 5.90 MIL/uL Final  . Hemoglobin 08/31/2016 12.0* 13.0 - 18.0 g/dL Final  . HCT 08/31/2016 36.9* 40.0 - 52.0 % Final  . MCV 08/31/2016 76.9* 80.0 - 100.0 fL Final  . MCH 08/31/2016 25.0* 26.0 - 34.0 pg Final  . MCHC 08/31/2016 32.5  32.0 - 36.0 g/dL Final  .  RDW 08/31/2016 17.0* 11.5 - 14.5 % Final  . Platelets 08/31/2016 276  150 - 440 K/uL Final  . Neutrophils Relative % 08/31/2016  47  % Final  . Neutro Abs 08/31/2016 2.2  1.4 - 6.5 K/uL Final  . Lymphocytes Relative 08/31/2016 40  % Final  . Lymphs Abs 08/31/2016 1.8  1.0 - 3.6 K/uL Final  . Monocytes Relative 08/31/2016 10  % Final  . Monocytes Absolute 08/31/2016 0.4  0.2 - 1.0 K/uL Final  . Eosinophils Relative 08/31/2016 2  % Final  . Eosinophils Absolute 08/31/2016 0.1  0 - 0.7 K/uL Final  . Basophils Relative 08/31/2016 1  % Final  . Basophils Absolute 08/31/2016 0.0  0 - 0.1 K/uL Final  . Iron 08/31/2016 24* 45 - 182 ug/dL Final  . TIBC 08/31/2016 390  250 - 450 ug/dL Final  . Saturation Ratios 08/31/2016 6* 17.9 - 39.5 % Final  . UIBC 08/31/2016 367  ug/dL Final  . Ferritin 08/31/2016 8* 24 - 336 ng/mL Final  Office Visit on 08/31/2016  Component Date Value Ref Range Status  . Color, Urine 08/31/2016 YELLOW* YELLOW Final  . APPearance 08/31/2016 CLEAR* CLEAR Final  . Specific Gravity, Urine 08/31/2016 1.016  1.005 - 1.030 Final  . pH 08/31/2016 7.0  5.0 - 8.0 Final  . Glucose, UA 08/31/2016 NEGATIVE  NEGATIVE mg/dL Final  . Hgb urine dipstick 08/31/2016 NEGATIVE  NEGATIVE Final  . Bilirubin Urine 08/31/2016 NEGATIVE  NEGATIVE Final  . Ketones, ur 08/31/2016 NEGATIVE  NEGATIVE mg/dL Final  . Protein, ur 08/31/2016 NEGATIVE  NEGATIVE mg/dL Final  . Nitrite 08/31/2016 NEGATIVE  NEGATIVE Final  . Leukocytes, UA 08/31/2016 NEGATIVE  NEGATIVE Final  . RBC / HPF 08/31/2016 0-5  0 - 5 RBC/hpf Final  . WBC, UA 08/31/2016 NONE SEEN  0 - 5 WBC/hpf Final  . Bacteria, UA 08/31/2016 NONE SEEN  NONE SEEN Final  . Squamous Epithelial / LPF 08/31/2016 NONE SEEN  NONE SEEN Final  . Mucous 08/31/2016 PRESENT   Final   Lab Results  Component Value Date   IRON 24 (L) 08/31/2016   TIBC 390 08/31/2016   IRONPCTSAT 6 (L) 08/31/2016   Lab Results  Component Value Date   FERRITIN 8 (L)  08/31/2016     RADIOGRAPHIC STUDIES: I have personally reviewed the radiological images as listed and agree with the findings in the report 08/08/2016 IMPRESSION: 1. 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 No CT evidence of right heart straining. 2. Multi vessel coronary vascular calcification and CABG surgery changes.  01/28/2016 colonoscopy showed diverticulosis of sigmoid colon and distant descending colon. 04/26/2015 EGD normal.  ASSESSMENT/PLAN 1. Iron deficiency anemia, unspecified iron deficiency anemia type   2. Chronic anticoagulation   3. PAF (paroxysmal atrial fibrillation) ([place])   4. Diverticulosis of colon    # Hypercoagulable work up: discussed with patient. He is tested negative for factor 5 leiden mutation, prothrombin mutation, anti phospholipin antibody panels. Normal protein C activity. Normal total protein S, mildly low protein S function, can be falsely low in the acute setting of thrombosis.Congential protein S deficiency is unlikely given that he has no prior history of VTE. I will hold additional work up for now. Continue Evalina Field. Will recheck free protein S level in 3 months.  # Severe iron deficiency anemia, likely from chronic diverticulosis of colon. Patient has had colonoscopy and EGD workup in the past. He does not tolerate oral iron supplementation. plan IV iron with Feraheme 510 mg IV weekly 2. Severe allergic reactions including anaphylaxis discussed with patient. patient is  willing to proceed with  IV iron infusion. I anticipate that with the repletion of his iron store, his fatigue and other symptoms related to iron deficiency anemia will significantly improve. Repeat CBC in 1 month.  Follow up with me in 3 months with CBC, iron TIBC, ferritin repeat prior to the visit.  Orders Placed This Encounter  Procedures  . CBC with Differential/Platelet    Standing  Status:   Future    Standing Expiration Date:   09/11/2017  . CBC with Differential/Platelet    Standing Status:   Future    Standing Expiration Date:   09/11/2017  . Iron and TIBC    Standing Status:   Future    Standing Expiration Date:   09/11/2017  . Ferritin    Standing Status:   Future    Standing Expiration Date:   09/11/2017    All questions were answered. The patient knows to call the clinic with any problems, questions or concerns.    Earlie Server, MD  09/11/2016 9:26 AM   Addendum:  I was called by RN that patient finished IV Feraheme and after the IV was taken out, patient reports having chest tightness, and shortness of breath more than his baseline. Patient was seen and examined in the infusion room. His vitals are stable he is saturating 100% on room oxygen. He appears anxious. Solu-Medrol  IV once 40 mg IV once and Benadryl 25 mg IV once were given. Patient was observed for 5-10 minutes. He reports having chills. He is shortness of breath did not improve. He reports he does not have much chest tightness at this point. Pepcid 20 mg IV once was given. Patient still has chills, SOB and mild chest tightness. With his extensive heart disease and recent PE, will send patient to ER for further management. I called the ER and talk to triage nurse.

## 2016-09-12 ENCOUNTER — Other Ambulatory Visit: Payer: Self-pay | Admitting: Oncology

## 2016-09-18 ENCOUNTER — Ambulatory Visit: Payer: Medicare HMO

## 2016-09-22 ENCOUNTER — Other Ambulatory Visit: Payer: Self-pay | Admitting: Thoracic Surgery (Cardiothoracic Vascular Surgery)

## 2016-09-22 DIAGNOSIS — J939 Pneumothorax, unspecified: Secondary | ICD-10-CM

## 2016-09-26 ENCOUNTER — Ambulatory Visit
Admission: RE | Admit: 2016-09-26 | Discharge: 2016-09-26 | Disposition: A | Discharge status: Home / Self Care | Payer: Medicare HMO | Source: Ambulatory Visit | Attending: Thoracic Surgery (Cardiothoracic Vascular Surgery) | Admitting: Thoracic Surgery (Cardiothoracic Vascular Surgery)

## 2016-09-26 ENCOUNTER — Ambulatory Visit (INDEPENDENT_AMBULATORY_CARE_PROVIDER_SITE_OTHER): Payer: Medicare HMO | Admitting: Thoracic Surgery (Cardiothoracic Vascular Surgery)

## 2016-09-26 ENCOUNTER — Encounter: Payer: Self-pay | Admitting: Thoracic Surgery (Cardiothoracic Vascular Surgery)

## 2016-09-26 VITALS — BP 108/69 | HR 67 | Resp 16 | Ht 69.5 in | Wt 169.0 lb

## 2016-09-26 DIAGNOSIS — Z09 Encounter for follow-up examination after completed treatment for conditions other than malignant neoplasm: Secondary | ICD-10-CM

## 2016-09-26 DIAGNOSIS — M9689 Other intraoperative and postprocedural complications and disorders of the musculoskeletal system: Secondary | ICD-10-CM | POA: Diagnosis not present

## 2016-09-26 DIAGNOSIS — J939 Pneumothorax, unspecified: Secondary | ICD-10-CM

## 2016-09-26 DIAGNOSIS — R05 Cough: Secondary | ICD-10-CM | POA: Diagnosis not present

## 2016-09-26 NOTE — Progress Notes (Signed)
[address]       [place] [address]             [phone]     HPI: Scott [name] returns for a scheduled follow-up visit  He is a 76 year old man who had coronary bypass grafting 2 at [facility] in 2016. He had severe chronic sternal pain for over a year. He had a nonunion of the manubrium. Sternal wire removal did not help. I do the sternal debridement and plating on 05/25/2016. He had a lot of pain early postop and when I last saw him in the office in early July he was still having pain and tightness in his chest. This was more sporadic rather than constant.  In the interim since his last visit he was found to have a pulmonary embolus in mid July and started on Xarelto. He went to the emergency room with a complaint of chest tightness couple of weeks ago after receiving an iron infusion having some type of reaction to it. He ruled out for MI.  His generalized constant sternal pain has improved dramatically. He is still intermittently having chest tightness.  Past Medical History:  Diagnosis Date  . Arthritis    right hip, since a fall  . CAD (coronary artery disease)    a. 04/2012 Cath: 3VD->Med Rx;  b. 04/2013 PCI RCA (2 DES); c. 03/2014 PCI: LAD 80 (3.0x23 Xience Alpine DES), 30 ISR in RCA; d. 06/2014 CABG x 2 ([facility]) LIMA->LAD, VG->OM; e. 11/2014 Neg MV;  f. 12/2014 Cath ([facility]): patent grafts->Med Rx; g. 03/2015 MV ([facility]) low risk, EF 40%; h. 05/2015 MV: low risk; i. 08/2015 Cath: patent grafts, patent RCA/LAD stents, small LCX/OM->Med Rx.  . Cardiomyopathy, ischemic    a. 04/2012 Echo: EF 40-45%;  b. 08/2013 Echo: EF 45-50%, mild glob HK, mod lat/post HK, diast dysfxn, mildly dil LA, mild MR, nl RVSP; c. 01/2015 Echo: EF 40-45%, Gr 1 DD, mildly dil LA.  . Carotid arterial disease ([place])    a. 05/2013 Carotid U/S; bilat 40-50% ICA stenosis.  . Chronic Chest Pain   . Chronic systolic CHF (congestive heart failure) ([place])    a. 01/2015 Echo: EF 40-45%.  . Cough   . Dyspnea    pt. unsure of exertion cause- that creates SOB at times   . Headache 1970's   migraine  . History of blood transfusion 2016   post op  . Hyperlipidemia   . Hypertension   . Iron deficiency anemia due to chronic blood loss 09/11/2016  . Myocardial infarction [facility])    unsure of when     Current Outpatient Prescriptions  Medication Sig Dispense Refill  . atorvastatin (LIPITOR) 80 MG tablet Take 80 mg by mouth at bedtime.    . carvedilol (COREG) 3.125 MG tablet Take 1 tablet (3.125 mg total) by mouth daily before breakfast. 90 tablet 3  . isosorbide mononitrate (IMDUR) 60 MG 24 hr tablet Take 60 mg by mouth daily before breakfast. Before breakfast     . nitroGLYCERIN (NITROSTAT) 0.4 MG SL tablet Place 1 tablet (0.4 mg total) under the tongue every 5 (five) minutes as needed for chest pain. 25 tablet 6  . sertraline (ZOLOFT) 25 MG tablet Take 1 tablet (25 mg total) by mouth daily. 30 tablet 1  . traMADol (ULTRAM) 50 MG tablet Take 1-2 tablets (50-100 mg total) by mouth every 6 (six) hours as needed for moderate pain. 30 tablet 0  . XARELTO 20  MG TABS tablet Take 20 mg by mouth daily.  1  . zolpidem (AMBIEN) 5 MG tablet Take 5 mg by mouth at bedtime as needed for sleep.      No current facility-administered medications for this visit.     Physical Exam BP 108/69 (BP Location: Right Arm, Patient Position: Sitting, Cuff Size: Large)   Pulse 67   Resp 16   Ht 5' 9.5" (1.765 m)   Wt 169 lb (76.7 kg)   SpO2 98% Comment: ON RA  BMI 24.36 kg/m  76 year old man in no acute distress Pale Alert and oriented 3 with no focal deficits Lungs clear with equal breath sounds bilaterally Cardiac regular rate and rhythm, no rubs or murmurs Sternal incision well-healed. Sternum stable and nontender.  Diagnostic Tests: CHEST  2 VIEW  COMPARISON:  09/11/2016  FINDINGS: Heart size and pulmonary vascularity are normal and the lungs are clear. No pneumothorax. No effusion. CABG. Coronary stents  in place. No acute bone abnormality.  IMPRESSION: No acute abnormalities.   Electronically Signed   By: Lorriane Shire M.D.   On: 09/26/2016 09:33 I personally reviewed the chest x-ray and concur with the findings noted above  Impression: 76 year old gentleman had sternal plating for sternal nonunion back in May. He has had a good result with that. His wound has healed nicely. His sternum is stable. He is no longer exquisitely tender to palpation around the upper sternum. His CT back in July showed good approximation of the bone.   He continues to have intermittent chest tightness which predated his procedure. He had an extensive workup at [facility] including catheterization which showed his grafts were patent. He was recently found to have a pulmonary embolus and is being treated for that.  Plan: I will be happy to see Scott [name] back at any time the future if I can be of any further assistance with his care.  Melrose Nakayama, MD [facility] [phone]

## 2016-10-23 ENCOUNTER — Telehealth: Payer: Self-pay | Admitting: Cardiovascular Disease

## 2016-10-23 ENCOUNTER — Other Ambulatory Visit: Payer: Self-pay | Admitting: *Deleted

## 2016-10-23 ENCOUNTER — Other Ambulatory Visit: Payer: Self-pay | Admitting: Cardiovascular Disease

## 2016-10-23 MED ORDER — XARELTO 20 MG PO TABS: 20.0000 mg | ORAL_TABLET | Freq: Every day | ORAL | 2 refills | Status: DC

## 2016-10-23 NOTE — Telephone Encounter (Signed)
Requested Prescriptions   Signed Prescriptions Disp Refills  . XARELTO 20 MG TABS tablet 90 tablet 2    Sig: Take 1 tablet (20 mg total) by mouth daily.    Authorizing Provider: Minna Merritts    Ordering User: Britt Bottom

## 2016-10-23 NOTE — Telephone Encounter (Signed)
°*  STAT* If patient is at the pharmacy, call can be transferred to refill team.   1. Which medications need to be refilled? (please list name of each medication and dose if known)  Xarelto  2. Which pharmacy/location (including street and city if local pharmacy) is medication to be sent to? [facility]  3. Do they need a 30 day or 90 day supply? 90 day

## 2016-11-07 ENCOUNTER — Observation Stay
Admission: EM | Admit: 2016-11-07 | Discharge: 2016-11-08 | Disposition: A | Discharge status: Home / Self Care | Payer: Medicare HMO | Attending: Internal Medicine | Admitting: Internal Medicine

## 2016-11-07 ENCOUNTER — Encounter: Payer: Self-pay | Admitting: Emergency Medicine

## 2016-11-07 ENCOUNTER — Observation Stay: Payer: Medicare HMO

## 2016-11-07 ENCOUNTER — Emergency Department: Payer: Medicare HMO

## 2016-11-07 DIAGNOSIS — I25118 Atherosclerotic heart disease of native coronary artery with other forms of angina pectoris: Principal | ICD-10-CM | POA: Insufficient documentation

## 2016-11-07 DIAGNOSIS — I209 Angina pectoris, unspecified: Secondary | ICD-10-CM | POA: Diagnosis present

## 2016-11-07 DIAGNOSIS — I10 Essential (primary) hypertension: Secondary | ICD-10-CM

## 2016-11-07 DIAGNOSIS — R05 Cough: Secondary | ICD-10-CM | POA: Diagnosis not present

## 2016-11-07 DIAGNOSIS — Z79899 Other long term (current) drug therapy: Secondary | ICD-10-CM | POA: Insufficient documentation

## 2016-11-07 DIAGNOSIS — I110 Hypertensive heart disease with heart failure: Secondary | ICD-10-CM | POA: Insufficient documentation

## 2016-11-07 DIAGNOSIS — I252 Old myocardial infarction: Secondary | ICD-10-CM | POA: Diagnosis not present

## 2016-11-07 DIAGNOSIS — I5042 Chronic combined systolic (congestive) and diastolic (congestive) heart failure: Secondary | ICD-10-CM | POA: Diagnosis not present

## 2016-11-07 DIAGNOSIS — Z7901 Long term (current) use of anticoagulants: Secondary | ICD-10-CM | POA: Diagnosis not present

## 2016-11-07 DIAGNOSIS — I480 Paroxysmal atrial fibrillation: Secondary | ICD-10-CM | POA: Diagnosis not present

## 2016-11-07 DIAGNOSIS — Z87891 Personal history of nicotine dependence: Secondary | ICD-10-CM | POA: Diagnosis not present

## 2016-11-07 DIAGNOSIS — Z951 Presence of aortocoronary bypass graft: Secondary | ICD-10-CM | POA: Diagnosis not present

## 2016-11-07 DIAGNOSIS — Z23 Encounter for immunization: Secondary | ICD-10-CM | POA: Diagnosis not present

## 2016-11-07 DIAGNOSIS — I2699 Other pulmonary embolism without acute cor pulmonale: Secondary | ICD-10-CM | POA: Diagnosis not present

## 2016-11-07 DIAGNOSIS — Z86711 Personal history of pulmonary embolism: Secondary | ICD-10-CM | POA: Insufficient documentation

## 2016-11-07 DIAGNOSIS — I700 Atherosclerosis of aorta: Secondary | ICD-10-CM | POA: Diagnosis not present

## 2016-11-07 DIAGNOSIS — I255 Ischemic cardiomyopathy: Secondary | ICD-10-CM | POA: Insufficient documentation

## 2016-11-07 DIAGNOSIS — I200 Unstable angina: Secondary | ICD-10-CM

## 2016-11-07 DIAGNOSIS — R509 Fever, unspecified: Secondary | ICD-10-CM | POA: Diagnosis not present

## 2016-11-07 DIAGNOSIS — R079 Chest pain, unspecified: Secondary | ICD-10-CM | POA: Diagnosis not present

## 2016-11-07 DIAGNOSIS — R0602 Shortness of breath: Secondary | ICD-10-CM | POA: Diagnosis not present

## 2016-11-07 DIAGNOSIS — I2510 Atherosclerotic heart disease of native coronary artery without angina pectoris: Secondary | ICD-10-CM | POA: Diagnosis not present

## 2016-11-07 DIAGNOSIS — E785 Hyperlipidemia, unspecified: Secondary | ICD-10-CM | POA: Insufficient documentation

## 2016-11-07 DIAGNOSIS — R072 Precordial pain: Secondary | ICD-10-CM | POA: Diagnosis not present

## 2016-11-07 HISTORY — DX: Other pulmonary embolism without acute cor pulmonale: I26.99

## 2016-11-07 LAB — LIPID PANEL
CHOL/HDL RATIO: 2.5 ratio
Cholesterol: 103 mg/dL (ref 0–200)
HDL: 41 mg/dL (ref >40)
LDL CALC: 56 mg/dL (ref 0–99)
TRIGLYCERIDES: 31 mg/dL (ref <150)
VLDL: 6 mg/dL (ref 0–40)

## 2016-11-07 LAB — BASIC METABOLIC PANEL
Anion gap: 9 (ref 5–15)
BUN: 22 mg/dL — AB (ref 6–20)
CO2: 25 mmol/L (ref 22–32)
CREATININE: 1.04 mg/dL (ref 0.61–1.24)
Calcium: 9.2 mg/dL (ref 8.9–10.3)
Chloride: 102 mmol/L (ref 101–111)
GFR calc non Af Amer: >60 mL/min (ref >60)
Glucose, Bld: 98 mg/dL (ref 65–99)
Potassium: 3.9 mmol/L (ref 3.5–5.1)
Sodium: 136 mmol/L (ref 135–145)

## 2016-11-07 LAB — TROPONIN I
Troponin I: <0.03 ng/mL (ref <0.03)
Troponin I: <0.03 ng/mL (ref <0.03)
Troponin I: <0.03 ng/mL (ref <0.03)

## 2016-11-07 LAB — CBC
HCT: 41 % (ref 40.0–52.0)
Hemoglobin: 14 g/dL (ref 13.0–18.0)
MCH: 28 pg (ref 26.0–34.0)
MCHC: 34.2 g/dL (ref 32.0–36.0)
MCV: 81.9 fL (ref 80.0–100.0)
PLATELETS: 194 10*3/uL (ref 150–440)
RBC: 5.01 MIL/uL (ref 4.40–5.90)
RDW: 24.2 % — AB (ref 11.5–14.5)
WBC: 4.6 10*3/uL (ref 3.8–10.6)

## 2016-11-07 LAB — GLUCOSE, CAPILLARY: Glucose-Capillary: 128 mg/dL — ABNORMAL HIGH (ref 65–99)

## 2016-11-07 LAB — APTT
aPTT: 44 seconds — ABNORMAL HIGH (ref 24–36)
aPTT: 134 seconds — ABNORMAL HIGH (ref 24–36)

## 2016-11-07 LAB — PHOSPHORUS: Phosphorus: 2.5 mg/dL (ref 2.5–4.6)

## 2016-11-07 LAB — HEMOGLOBIN A1C
Hgb A1c MFr Bld: 5.9 % — ABNORMAL HIGH (ref 4.8–5.6)
Mean Plasma Glucose: 122.63 mg/dL

## 2016-11-07 LAB — MAGNESIUM: MAGNESIUM: 1.8 mg/dL (ref 1.7–2.4)

## 2016-11-07 LAB — PROTIME-INR
INR: 2.61
Prothrombin Time: 27.7 seconds — ABNORMAL HIGH (ref 11.4–15.2)

## 2016-11-07 LAB — MRSA PCR SCREENING: MRSA by PCR: NEGATIVE

## 2016-11-07 MED ORDER — CARVEDILOL 3.125 MG PO TABS
3.1250 mg | ORAL_TABLET | Freq: Every day | ORAL | Status: DC
  Administered 2016-11-08: 3.125 mg via ORAL
  Filled 2016-11-07: qty 1

## 2016-11-07 MED ORDER — IOPAMIDOL (ISOVUE-370) INJECTION 76%
75.0000 mL | Freq: Once | INTRAVENOUS | Status: AC | PRN
  Administered 2016-11-07: 75 mL via INTRAVENOUS

## 2016-11-07 MED ORDER — HEPARIN (PORCINE) IN NACL 100-0.45 UNIT/ML-% IJ SOLN
800.0000 [IU]/h | INTRAMUSCULAR | Status: DC
  Administered 2016-11-07: 900 [IU]/h via INTRAVENOUS
  Filled 2016-11-07: qty 250

## 2016-11-07 MED ORDER — METHYLPREDNISOLONE SODIUM SUCC 125 MG IJ SOLR
125.0000 mg | Freq: Once | INTRAMUSCULAR | Status: AC
  Administered 2016-11-07: 125 mg via INTRAVENOUS
  Filled 2016-11-07: qty 2

## 2016-11-07 MED ORDER — TRAMADOL HCL 50 MG PO TABS
50.0000 mg | ORAL_TABLET | Freq: Four times a day (QID) | ORAL | Status: DC | PRN
  Administered 2016-11-08: 50 mg via ORAL
  Filled 2016-11-07: qty 1

## 2016-11-07 MED ORDER — ACETAMINOPHEN 500 MG PO TABS: 1000.0000 mg | ORAL_TABLET | Freq: Four times a day (QID) | ORAL | Status: DC | PRN

## 2016-11-07 MED ORDER — DIPHENHYDRAMINE HCL 50 MG/ML IJ SOLN
50.0000 mg | Freq: Once | INTRAMUSCULAR | Status: AC
  Administered 2016-11-07: 50 mg via INTRAVENOUS
  Filled 2016-11-07: qty 1

## 2016-11-07 MED ORDER — RAMIPRIL 2.5 MG PO CAPS
2.5000 mg | ORAL_CAPSULE | Freq: Every day | ORAL | Status: DC
  Administered 2016-11-08: 2.5 mg via ORAL
  Filled 2016-11-07: qty 1

## 2016-11-07 MED ORDER — DOCUSATE SODIUM 100 MG PO CAPS: 100.0000 mg | ORAL_CAPSULE | Freq: Two times a day (BID) | ORAL | Status: DC | PRN

## 2016-11-07 MED ORDER — ZOLPIDEM TARTRATE 5 MG PO TABS
5.0000 mg | ORAL_TABLET | Freq: Every evening | ORAL | Status: DC | PRN
  Administered 2016-11-08: 5 mg via ORAL
  Filled 2016-11-07: qty 1

## 2016-11-07 MED ORDER — INFLUENZA VAC SPLIT HIGH-DOSE 0.5 ML IM SUSY
0.5000 mL | PREFILLED_SYRINGE | INTRAMUSCULAR | Status: AC
  Administered 2016-11-08: 0.5 mL via INTRAMUSCULAR
  Filled 2016-11-07: qty 0.5

## 2016-11-07 MED ORDER — NITROGLYCERIN IN D5W 200-5 MCG/ML-% IV SOLN
0.0000 ug/min | Freq: Once | INTRAVENOUS | Status: AC
  Administered 2016-11-07: 20 ug/min via INTRAVENOUS
  Filled 2016-11-07: qty 250

## 2016-11-07 MED ORDER — MORPHINE SULFATE (PF) 2 MG/ML IV SOLN
2.0000 mg | INTRAVENOUS | Status: DC | PRN
  Administered 2016-11-07 – 2016-11-08 (×2): 2 mg via INTRAVENOUS
  Filled 2016-11-07 (×2): qty 1

## 2016-11-07 MED ORDER — ATORVASTATIN CALCIUM 20 MG PO TABS
80.0000 mg | ORAL_TABLET | Freq: Every day | ORAL | Status: DC
  Administered 2016-11-07: 80 mg via ORAL
  Filled 2016-11-07: qty 4

## 2016-11-07 MED ORDER — NITROGLYCERIN IN D5W 200-5 MCG/ML-% IV SOLN
0.0000 ug/min | INTRAVENOUS | Status: DC
  Administered 2016-11-07: 50 ug/min via INTRAVENOUS
  Administered 2016-11-08: 90 ug/min via INTRAVENOUS
  Filled 2016-11-07: qty 250

## 2016-11-07 MED ORDER — ISOSORBIDE MONONITRATE ER 30 MG PO TB24: 60.0000 mg | ORAL_TABLET | Freq: Every day | ORAL | Status: DC

## 2016-11-07 MED ORDER — ALUM & MAG HYDROXIDE-SIMETH 200-200-20 MG/5ML PO SUSP
30.0000 mL | ORAL | Status: DC | PRN
  Administered 2016-11-07: 30 mL via ORAL
  Filled 2016-11-07 (×2): qty 30

## 2016-11-07 NOTE — Progress Notes (Addendum)
ANTICOAGULATION CONSULT NOTE - Initial Consult  Pharmacy Consult for Heparin Drip  Indication: chest pain/ACS  Allergies  Allergen Reactions  . Contrast Media [Iodinated Diagnostic Agents] Hives  . Oxycodone Hcl Itching  . Tramadol Itching  . Vicodin [Hydrocodone-Acetaminophen] Itching    Patient Measurements: Height: 5\' 11"  (180.3 cm) Weight: 170 lb (77.1 kg) IBW/kg (Calculated) : 75.3  Vital Signs: Temp: 97.7 F (36.5 C) (10/16 1135) Temp Source: Oral (10/16 1135) BP: 144/75 (10/16 1221) Pulse Rate: 63 (10/16 1221)  Labs:  Recent Labs  11/07/16 1134  HGB 14.0  HCT 41.0  PLT 194  CREATININE 1.04  TROPONINI <0.03    Estimated Creatinine Clearance: 66.4 mL/min (by C-G formula based on SCr of 1.04 mg/dL).   Medical History: Past Medical History:  Diagnosis Date  . Arthritis    right hip, since a fall  . CAD (coronary artery disease)    a. 04/2012 Cath: 3VD->Med Rx;  b. 04/2013 PCI RCA (2 DES); c. 03/2014 PCI: LAD 80 (3.0x23 Xience Alpine DES), 30 ISR in RCA; d. 06/2014 CABG x 2 ([facility]) LIMA->LAD, VG->OM; e. 11/2014 Neg MV;  f. 12/2014 Cath ([facility]): patent grafts->Med Rx; g. 03/2015 MV ([facility]) low risk, EF 40%; h. 05/2015 MV: low risk; i. 08/2015 Cath: patent grafts, patent RCA/LAD stents, small LCX/OM->Med Rx.  . Cardiomyopathy, ischemic    a. 04/2012 Echo: EF 40-45%;  b. 08/2013 Echo: EF 45-50%, mild glob HK, mod lat/post HK, diast dysfxn, mildly dil LA, mild MR, nl RVSP; c. 01/2015 Echo: EF 40-45%, Gr 1 DD, mildly dil LA.  . Carotid arterial disease ([place])    a. 05/2013 Carotid U/S; bilat 40-50% ICA stenosis.  . Chronic Chest Pain   . Chronic systolic CHF (congestive heart failure) ([place])    a. 01/2015 Echo: EF 40-45%.  . Cough   . Dyspnea    pt. unsure of exertion cause- that creates SOB at times   . Headache 1970's   migraine  . History of blood transfusion 2016   post op  . Hyperlipidemia   . Hypertension   . Iron deficiency anemia due to chronic blood loss 09/11/2016   . Myocardial infarction [facility])    unsure of when    Assessment: Pharmacy consulted for heparin dosing and monitoring in a 76yo male with Chest Pain. Patient takes Xarelto (Rivaroxaban) for PMH of PE (July 2018), last dose was this morning 10/16 @ around 0800.   Goal of Therapy:  Heparin level 0.3-0.7 units/ml aPTT 66-102 seconds Monitor platelets by anticoagulation protocol: Yes   Plan:  Baseline labs ordered, according to baseline heparin level (Anti-Xa level).   Will not give a bolus due to patient taking Xarelto this morning.   Start heparin infusion at 900 units/hr Check anti-Xa level and aPTT in 8 hours and daily while on heparin.  Will need to adjust dose based on aPTT levels until anti-Xa levels and aPTT levels correlate.  Continue to monitor H&H and platelets  Pernell Dupre, PharmD, BCPS Clinical Pharmacist 11/07/2016 12:29 PM

## 2016-11-07 NOTE — ED Notes (Signed)
Patient called out. Upon entering room. Patient is moaning in pain. Pain has increased to 7/10. Acute onset of left shoulder pain. MD notified. Second EKG obtained.

## 2016-11-07 NOTE — ED Notes (Signed)
Patient transported to radiology

## 2016-11-07 NOTE — ED Notes (Signed)
Attempted to call report x 1  

## 2016-11-07 NOTE — ED Notes (Signed)
Admitting MD at bedside.

## 2016-11-07 NOTE — ED Triage Notes (Signed)
Patient presents to ED via [facility] from home with c/o CP. Hx of MI, PE and double bipass. Patient SOB on arrival. Patient states he normally walks a mile today. This morning he was not able to finish his walk. Patient currently on zarelto.

## 2016-11-07 NOTE — Progress Notes (Signed)
Cardiology Progress Note  Date: 11/07/16 Time: 10:10 PM  Troponin negative x 4. Given that patient took rivaroxaban this morning, I will d/c heparin overnight to minimize risk for bleeding. I will defer restarting heparin versus continuing rivaroxaban to Dr. Rockey Situ in the morning.  Nelva Bush, MD [facility] Pager: [phone]

## 2016-11-07 NOTE — ED Notes (Signed)
Dr. Stafford at bedside.  

## 2016-11-07 NOTE — Consult Note (Signed)
PULMONARY / CRITICAL CARE MEDICINE   Name: Scott [name] MRN: [id number] DOB: Jun 21, 1940    ADMISSION DATE:  11/07/2016   CONSULTATION DATE:  11/07/2016  REFERRING MD:  Dr. Anselm Jungling  REASON: CHEST PAIN  CHIEF COMPLAINT:  CHEST PAIN AND DYSPNEA  HISTORY OF PRESENT ILLNESS:   This is a 76 year old Caucasian male with a past medical history as indicated below. Presented to the ED with complaints of chest pain and dyspnea. Patient states that symptoms started yesterday and gradually got worse. This morning while he was walking he became severely short of breath and had chest pain. He describes the chest pain as a feeling of chest tightness localized in the left substernal area, and not radiating. Pain is worse with exertion and improved with rest. Only Associated symptom is dyspnea. He took 3 doses of nitroglycerin as well as Xarelto without any relief. Hence, he decided to come to the ED. He is being admitted to the ICU for further monitoring and management of chest pain. He reports significant improvement in symptoms but states that the pain has not completely disappeared. He rates it as a 3 on 10. He is on nitroglycerin infusion and heparin infusion. He reports having cold symptoms a few days ago with a fever on Friday. The cold symptoms have improved but he is still coughing. Cough is associated with yellow sputum.  PAST MEDICAL HISTORY :  He  has a past medical history of Arthritis; CAD (coronary artery disease); Cardiomyopathy, ischemic; Carotid arterial disease ([place]); Chronic Chest Pain; Chronic systolic CHF (congestive heart failure) ([place]); Cough; Dyspnea; Headache (1970's); History of blood transfusion (2016); Hyperlipidemia; Hypertension; Iron deficiency anemia due to chronic blood loss (09/11/2016); Myocardial infarction [facility]); and Pulmonary emboli ([place]) (07/2016).  PAST SURGICAL HISTORY: He  has a past surgical history that includes right shoulder; Carpal tunnel release; Tonsillectomy;  Cataract extraction w/PHACO (Left, 09/16/2014); Cardiac catheterization (05/01/2012); Cardiac catheterization (04/2013); Coronary angioplasty with stent (04/13/2014); Cardiac catheterization (01/11/15 ); De Quervain's release (Left, 08/22/2012); Colonoscopy; Esophagogastroduodenoscopy (egd) with propofol (N/A, 04/26/2015); Cardiac catheterization (N/A, 09/16/2015); Colonoscopy with propofol (N/A, 01/28/2016); Coronary artery bypass graft (06-26-14); Sternal wires removal (N/A, 03/30/2016); Cataract extraction; and Rib plating (N/A, 05/25/2016).  Allergies  Allergen Reactions  . Contrast Media [Iodinated Diagnostic Agents] Hives  . Oxycodone Hcl Itching  . Tramadol Itching  . Vicodin [Hydrocodone-Acetaminophen] Itching    No current facility-administered medications on file prior to encounter.    Current Outpatient Prescriptions on File Prior to Encounter  Medication Sig  . atorvastatin (LIPITOR) 80 MG tablet Take 80 mg by mouth at bedtime.  . carvedilol (COREG) 3.125 MG tablet Take 1 tablet (3.125 mg total) by mouth daily before breakfast.  . isosorbide mononitrate (IMDUR) 60 MG 24 hr tablet Take 60 mg by mouth daily before breakfast.   . traMADol (ULTRAM) 50 MG tablet Take 1-2 tablets (50-100 mg total) by mouth every 6 (six) hours as needed for moderate pain. (Patient taking differently: Take 50 mg by mouth every 6 (six) hours as needed for moderate pain. )  . XARELTO 20 MG TABS tablet Take 1 tablet (20 mg total) by mouth daily.  Marland Kitchen zolpidem (AMBIEN) 5 MG tablet Take 5 mg by mouth at bedtime as needed for sleep.     FAMILY HISTORY:  His indicated that his mother is deceased. He indicated that his father is deceased. He indicated that his maternal uncle is deceased.    SOCIAL HISTORY: He  reports that he has quit smoking. His smoking  use included Cigarettes. He started smoking about 7 years ago. He has a 35.00 pack-year smoking history. He has never used smokeless tobacco. He reports that he does not drink  alcohol or use drugs.  REVIEW OF SYSTEMS:   Constitutional: Negative for fever and chills.  HENT: Negative for congestion and rhinorrhea.  Eyes: Negative for redness and visual disturbance.  Respiratory: Positive for shortness of breath, cough, but negative for  wheezing.  Cardiovascular: Positive for chest pain , but negative for palpitation and lower extremity edema.  Gastrointestinal: Negative  for nausea , vomiting and abdominal pain and  Loose stools but positive for poor appetite Genitourinary: Negative for dysuria and urgency.  Endocrine: Denies polyuria, polyphagia and heat intolerance Musculoskeletal: Negative for myalgias and arthralgias.  Skin: Negative for pallor and wound.  Neurological: Negative for dizziness and headaches   SUBJECTIVE:   VITAL SIGNS: BP (!) 94/50   Pulse 74   Temp 97.7 F (36.5 C) (Oral)   Resp 16   Ht 5\' 11"  (1.803 m)   Wt 170 lb (77.1 kg)   SpO2 97%   BMI 23.71 kg/m   HEMODYNAMICS:    VENTILATOR SETTINGS:    INTAKE / OUTPUT: No intake/output data recorded.  PHYSICAL EXAMINATION: General:  Pleasant, no acute distress Neuro: Alert and oriented 4, cranial nerves intact HEENT:  Normocephalic and atraumatic, PERRLA, conjunctivae pink, trachea midline, no JVD Cardiovascular: Apical pulse regular, S1, S2, no murmur, regurg or gallop, no edema, +2 pulses bilaterally Lungs: Normal work of breathing, breath sounds bilaterally without wheezes or rhonchi Abdomen:  Distended, normal bowel sounds in all 4 quadrants, palpation reveals no organomegaly Musculoskeletal:  No deformities, positive range of motion in upper and lower extremities Skin: Warm and dry  LABS:  BMET  Recent Labs Lab 11/07/16 1134  NA 136  K 3.9  CL 102  CO2 25  BUN 22*  CREATININE 1.04  GLUCOSE 98    Electrolytes  Recent Labs Lab 11/07/16 1134  CALCIUM 9.2    CBC  Recent Labs Lab 11/07/16 1134  WBC 4.6  HGB 14.0  HCT 41.0  PLT 194     Coag's  Recent Labs Lab 11/07/16 1216  APTT 44*  INR 2.61    Sepsis Markers No results for input(s): LATICACIDVEN, PROCALCITON, O2SATVEN in the last 168 hours.  ABG No results for input(s): PHART, PCO2ART, PO2ART in the last 168 hours.  Liver Enzymes No results for input(s): AST, ALT, ALKPHOS, BILITOT, ALBUMIN in the last 168 hours.  Cardiac Enzymes  Recent Labs Lab 11/07/16 1134 11/07/16 1509  TROPONINI <0.03 <0.03    Glucose  Recent Labs Lab 11/07/16 1602  GLUCAP 128*    Imaging Dg Chest 2 View  Result Date: 11/07/2016 CLINICAL DATA:  Chest pain. EXAM: CHEST  2 VIEW COMPARISON:  Chest x-ray dated September 26, 2016. FINDINGS: Postsurgical changes related to prior CABG. The cardiomediastinal silhouette is normal in size. Normal pulmonary vascularity. No focal consolidation, pleural effusion, or pneumothorax. IMPRESSION: No active cardiopulmonary disease. Electronically Signed   By: Titus Dubin M.D.   On: 11/07/2016 11:53   Ct Angio Chest Pe W And/or Wo Contrast  Result Date: 11/07/2016 CLINICAL DATA:  Shortness of breath and chest pain EXAM: CT ANGIOGRAPHY CHEST WITH CONTRAST TECHNIQUE: Multidetector CT imaging of the chest was performed using the standard protocol during bolus administration of intravenous contrast. Multiplanar CT image reconstructions and MIPs were obtained to evaluate the vascular anatomy. CONTRAST:  75 mL Isovue 370 nonionic COMPARISON:  Chest CT August 08, 2016 and chest radiograph November 07, 2016 FINDINGS: Cardiovascular: Currently no pulmonary embolus is evident. Previous left upper lobe pulmonary embolus has resolved. There is no appreciable thoracic aortic aneurysm or dissection. There is mild atherosclerotic calcification in the proximal right common and left subclavian artery. Visualized great vessels otherwise appear unremarkable. There are scattered foci of thoracic aortic atherosclerosis. There is calcification in multiple native  coronary arteries. There is evidence of previous median sternotomy with coronary artery bypass grafting. Pericardium is not appreciably thickened. Mediastinum/Nodes: Thyroid appears unremarkable. There are scattered subcentimeter mediastinal lymph nodes. There is no adenopathy by size criteria in the thoracic region. No esophageal lesions are appreciable. Lungs/Pleura: There is mild bibasilar atelectasis. There is no lung edema or consolidation. No pleural effusion or pleural thickening evident. Upper Abdomen: Visualized upper abdominal structures appear normal except for scattered foci of atherosclerotic calcification. Musculoskeletal: Previous median sternotomy. Degenerative change noted in thoracic spine. There are no blastic or lytic bone lesions. Review of the MIP images confirms the above findings. IMPRESSION: 1. No pulmonary embolus. Previously noted pulmonary embolus in left upper lobe region has resolved. 2. Foci of aortic and proximal great vessel atherosclerosis. Foci of native coronary artery calcification. Previous coronary artery bypass grafting. 3. No adenopathy by size criteria. Subcentimeter mediastinal lymph nodes are felt to be nonspecific. Aortic Atherosclerosis (ICD10-I70.0). Electronically Signed   By: Lowella Grip III M.D.   On: 11/07/2016 16:48    STUDIES:  2-D echo pending  CULTURES: None  ANTIBIOTICS: None  SIGNIFICANT EVENTS: 11/07/16>admitted  LINES/TUBES: PIVs  DISCUSSION: This is a 76 year old Caucasian male with a significant cardiac history, pulmonary embolism and previous CABG, presenting with exertional chest pain. CT angiogram negative for pulmonary embolism and negative troponins. Persistent chest pain on nitroglycerin infusion; paced rhythm on EKG;cxr negative  ASSESSMENT  Unstable angina versus ACS-Troponin negative and no significant ST changes Cough and fever>likely acute bronchitis History of: Paroxysmal Afib, PE on xarelto, ischemic  cardiomyopathy, CAD S/P CABG, hypertension, and hyperlipidemia  PLAN Hemodynamics per ICU protocol  Cardiology following; appreciate input Nitroglycerin and heparin gttes Morphine prn for chest pain/dyspnea F/U 2 D echo Continue all home medications except xarelto and ASA PRN Robitussin for cough Monitor fever curve NPO after midnight per cardiology GI and DVT prophylaxis  Further changes in treatment plan pending clinical course and diagnostics   FAMILY  - Updates: Patient and family updated at bedside.  - Inter-disciplinary family meet or Palliative Care meeting due by:  day 7  [facility] ANP-BC Pulmonary and Critical Care Medicine [facility] Pager [phone] or [phone]  11/07/2016, 5:33 PM   Merton Border, MD PCCM service Mobile [phone] Pager [phone] 11/08/2016 2:56 PM

## 2016-11-07 NOTE — ED Provider Notes (Signed)
[facility] Emergency Department Provider Note  ____________________________________________  Time seen: Approximately 12:09 PM  I have reviewed the triage vital signs and the nursing notes.   HISTORY  Chief Complaint Chest Pain    HPI Scott [name] is a 76 y.o. male who complains of worsening exertional chest pain and shortness of breath over the past 2-3 weeks. He's been taking nitroglycerin with relief, but the symptoms are occurring more and more easily over the past few weeks. This morning he went on a walk, but got severely short of breath. He has not been able to fully recover even with rest. Any degree of exertion now causes severe shortness of breath as well as chest tightness. The chest tightness is nonradiating, no vomiting or sweats. Worse with exertion and associated with shortness of breath.  The patient has a history of CAD, status post PCI and CABG. Has ischemic cardiomyopathy with systolic heart failure. Patient is on Xarelto and Coreg, has been compliant with these medications. His cardiologist is Dr. Rockey Situ.     Past Medical History:  Diagnosis Date  . Arthritis    right hip, since a fall  . CAD (coronary artery disease)    a. 04/2012 Cath: 3VD->Med Rx;  b. 04/2013 PCI RCA (2 DES); c. 03/2014 PCI: LAD 80 (3.0x23 Xience Alpine DES), 30 ISR in RCA; d. 06/2014 CABG x 2 ([facility]) LIMA->LAD, VG->OM; e. 11/2014 Neg MV;  f. 12/2014 Cath ([facility]): patent grafts->Med Rx; g. 03/2015 MV ([facility]) low risk, EF 40%; h. 05/2015 MV: low risk; i. 08/2015 Cath: patent grafts, patent RCA/LAD stents, small LCX/OM->Med Rx.  . Cardiomyopathy, ischemic    a. 04/2012 Echo: EF 40-45%;  b. 08/2013 Echo: EF 45-50%, mild glob HK, mod lat/post HK, diast dysfxn, mildly dil LA, mild MR, nl RVSP; c. 01/2015 Echo: EF 40-45%, Gr 1 DD, mildly dil LA.  . Carotid arterial disease ([place])    a. 05/2013 Carotid U/S; bilat 40-50% ICA stenosis.  . Chronic Chest Pain   . Chronic systolic CHF  (congestive heart failure) ([place])    a. 01/2015 Echo: EF 40-45%.  . Cough   . Dyspnea    pt. unsure of exertion cause- that creates SOB at times   . Headache 1970's   migraine  . History of blood transfusion 2016   post op  . Hyperlipidemia   . Hypertension   . Iron deficiency anemia due to chronic blood loss 09/11/2016  . Myocardial infarction [facility])    unsure of when     Patient Active Problem List   Diagnosis Date Noted  . Iron deficiency anemia due to chronic blood loss 09/11/2016  . Pulmonary embolus ([place]) 08/31/2016  . Panic disorder   . 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 Panic attack 08/04/2016  . 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 Coronary artery disease involving native coronary artery of native heart with angina pectoris with documented spasm ([place])   . Hx of CABG   . Ischemic cardiomyopathy   . Chronic systolic CHF (congestive heart failure) ([place])   . Chronic Chest Pain   . Coronary artery disease involving coronary bypass graft of native heart with angina pectoris with documented spasm ([place])   . Angina pectoris ([place])   . Anxiety   . Hyperlipemia   . Chronic diastolic heart failure ([place]) 12/21/2014  . Unstable angina ([place]) 12/08/2014  . PAF (paroxysmal atrial fibrillation) ([place]) 12/08/2014  . Chronic anticoagulation 12/08/2014  . Anemia 04/20/2014  . Osteoarthritis,  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  . Cardiomyopathy, ischemic   . HTN (hypertension)   . Carotid arterial disease ([place])   . S/P drug eluting coronary stent placement 05/30/2013  . Chest pain with low risk for cardiac etiology 05/05/2013  . SOB (shortness of breath) 05/05/2013  . Coronary artery disease involving coronary bypass graft of native heart with angina pectoris ([place]) 05/05/2013  . Hyperlipidemia 05/05/2013  . History of smoking 05/05/2013  . Cardiac angina ([place]) 05/05/2013     Past Surgical History:  Procedure Laterality Date  . CARDIAC CATHETERIZATION  05/01/2012  . CARDIAC  CATHETERIZATION  04/2013   [facility];x3 stent  . CARDIAC CATHETERIZATION  01/11/15    [facility]  . CARDIAC CATHETERIZATION N/A 09/16/2015   Procedure: LEFT HEART CATH AND CORS/GRAFTS ANGIOGRAPHY;  Surgeon: Minna Merritts, MD;  Location: [place] [facility];  Service: Cardiovascular;  Laterality: N/A;  . CARPAL TUNNEL RELEASE     right hand  . CATARACT EXTRACTION     LEFT  . CATARACT EXTRACTION W/PHACO Left 09/16/2014   Procedure: CATARACT EXTRACTION PHACO AND INTRAOCULAR LENS PLACEMENT (IOC);  Surgeon: Leandrew Koyanagi, MD;  Location: [place];  Service: Ophthalmology;  Laterality: Left;  . COLONOSCOPY    . COLONOSCOPY WITH PROPOFOL N/A 01/28/2016   Procedure: COLONOSCOPY WITH PROPOFOL;  Surgeon: Lollie Sails, MD;  Location: [facility] ENDOSCOPY;  Service: Endoscopy;  Laterality: N/A;  . CORONARY ANGIOPLASTY WITH STENT PLACEMENT  04/13/2014  . CORONARY ARTERY BYPASS GRAFT  06-26-14   x3 bypasses  . DE QUERVAIN'S RELEASE Left 08/22/2012  . ESOPHAGOGASTRODUODENOSCOPY (EGD) WITH PROPOFOL N/A 04/26/2015   Procedure: ESOPHAGOGASTRODUODENOSCOPY (EGD) WITH PROPOFOL;  Surgeon: Hulen Luster, MD;  Location: [facility] ENDOSCOPY;  Service: Gastroenterology;  Laterality: N/A;  . RIB PLATING N/A 05/25/2016   Procedure: STERNAL PLATING;  Surgeon: Melrose Nakayama, MD;  Location: [place];  Service: Thoracic;  Laterality: N/A;  . right shoulder    . STERNAL WIRES REMOVAL N/A 03/30/2016   Procedure: STERNAL WIRES REMOVAL;  Surgeon: Melrose Nakayama, MD;  Location: [place];  Service: Thoracic;  Laterality: N/A;  . TONSILLECTOMY       Prior to Admission medications   Medication Sig Start Date End Date Taking? Authorizing Provider  acetaminophen (TYLENOL) 500 MG tablet Take 1,000 mg by mouth every 6 (six) hours as needed for moderate pain.   Yes [provider]  atorvastatin (LIPITOR) 80 MG tablet Take 80 mg by mouth at bedtime.   Yes [provider]  carvedilol (COREG) 3.125 MG tablet Take 1  tablet (3.125 mg total) by mouth daily before breakfast. 05/15/16  Yes Gollan, [name], MD  diphenhydramine-acetaminophen (TYLENOL PM) 25-500 MG TABS tablet Take 2 tablets by mouth at bedtime as needed. For pain/sleep   Yes [provider]  isosorbide mononitrate (IMDUR) 60 MG 24 hr tablet Take 60 mg by mouth daily before breakfast.    Yes [provider]  nitroGLYCERIN (NITROSTAT) 0.4 MG SL tablet Place 0.4 mg under the tongue every 5 (five) minutes as needed for chest pain.   Yes [provider]  ramipril (ALTACE) 2.5 MG capsule Take 2.5 mg by mouth daily.   Yes [provider]  traMADol (ULTRAM) 50 MG tablet Take 1-2 tablets (50-100 mg total) by mouth every 6 (six) hours as needed for moderate pain. Patient taking differently: Take 50 mg by mouth every 6 (six) hours as needed for moderate pain.  06/30/16  Yes Melrose Nakayama, MD  XARELTO 20 MG  TABS tablet Take 1 tablet (20 mg total) by mouth daily. 10/23/16  Yes Gollan, [name], MD  zolpidem (AMBIEN) 5 MG tablet Take 5 mg by mouth at bedtime as needed for sleep.    Yes [provider]     Allergies Contrast media [iodinated diagnostic agents]; Oxycodone hcl; Tramadol; and Vicodin [hydrocodone-acetaminophen]   Family History  Problem Relation Age of Onset  . Heart attack Father 43       MI  . Cancer Maternal Uncle     Social History Social History  Substance Use Topics  . Smoking status: Former Smoker    Packs/day: 1.00    Years: 35.00    Types: Cigarettes    Start date: 05/02/2009  . Smokeless tobacco: Never Used     Comment: quit in 2006  . Alcohol use No    Review of Systems  Constitutional:   No fever or chills.  ENT:   No sore throat. No rhinorrhea. Cardiovascular:   positive as above chest pain without syncope. Respiratory:  positive shortness of breath without cough. Gastrointestinal:   Negative for abdominal pain, vomiting and diarrhea.  Musculoskeletal:   Negative  for focal pain or swelling All other systems reviewed and are negative except as documented above in ROS and HPI.  ____________________________________________   PHYSICAL EXAM:  VITAL SIGNS: ED Triage Vitals  Enc Vitals Group     BP 11/07/16 1135 132/89     Pulse Rate 11/07/16 1132 62     Resp 11/07/16 1132 16     Temp 11/07/16 1135 97.7 F (36.5 C)     Temp Source 11/07/16 1135 Oral     SpO2 11/07/16 1132 100 %     Weight 11/07/16 1132 170 lb (77.1 kg)     Height 11/07/16 1132 5\' 11"  (1.803 m)     Head Circumference --      Peak Flow --      Pain Score 11/07/16 1131 6     Pain Loc --      Pain Edu? --      Excl. in [place]? --     Vital signs reviewed, nursing assessments reviewed.   Constitutional:   Alert and oriented. moderate distress. Eyes:   No scleral icterus.  EOMI. No nystagmus. No conjunctival pallor. PERRL. ENT   Head:   Normocephalic and atraumatic.   Nose:   No congestion/rhinnorhea.    Mouth/Throat:   MMM, no pharyngeal erythema. No peritonsillar mass.    Neck:   No meningismus. Full ROM. Hematological/Lymphatic/Immunilogical:   No cervical lymphadenopathy. Cardiovascular:   RRR. Symmetric bilateral radial and DP pulses.  No murmurs.  Respiratory:   tachypnea, somewhat decreased breath sounds on the right. No wheezes or crackles. Gastrointestinal:   Soft and nontender. Non distended. There is no CVA tenderness.  No rebound, rigidity, or guarding. Genitourinary:   deferred Musculoskeletal:   Normal range of motion in all extremities. No joint effusions.  No lower extremity tenderness.  No edema. Neurologic:   Normal speech and language.  Motor grossly intact. No gross focal neurologic deficits are appreciated.  Skin:    Skin is warm, dry and intact. No rash noted.  No petechiae, purpura, or bullae.  ____________________________________________    LABS (pertinent positives/negatives) (all labs ordered are listed, but only abnormal results are  displayed) Labs Reviewed  BASIC METABOLIC PANEL - Abnormal; Notable for the following:       Result Value   BUN 22 (*)  All other components within normal limits  CBC - Abnormal; Notable for the following:    RDW 24.2 (*)    All other components within normal limits  APTT - Abnormal; Notable for the following:    aPTT 44 (*)    All other components within normal limits  PROTIME-INR - Abnormal; Notable for the following:    Prothrombin Time 27.7 (*)    All other components within normal limits  HEPARIN LEVEL (UNFRACTIONATED) - Abnormal; Notable for the following:    Heparin Unfractionated >3.60 (*)    All other components within normal limits  TROPONIN I  HEPARIN LEVEL (UNFRACTIONATED)  APTT   ____________________________________________   EKG  interpreted by me Sinus rhythm rate of 59, left axis, normal intervals. Poor R-wave progression in anterior precordial leads. Normal ST segments and T waves.  ____________________________________________    RADIOLOGY  Dg Chest 2 View  Result Date: 11/07/2016 CLINICAL DATA:  Chest pain. EXAM: CHEST  2 VIEW COMPARISON:  Chest x-ray dated September 26, 2016. FINDINGS: Postsurgical changes related to prior CABG. The cardiomediastinal silhouette is normal in size. Normal pulmonary vascularity. No focal consolidation, pleural effusion, or pneumothorax. IMPRESSION: No active cardiopulmonary disease. Electronically Signed   By: Titus Dubin M.D.   On: 11/07/2016 11:53    ____________________________________________   PROCEDURES Procedures CRITICAL CARE Performed by: Joni Fears, Seraphina Mitchner   Total critical care time: 35 minutes  Critical care time was exclusive of separately billable procedures and treating other patients.  Critical care was necessary to treat or prevent imminent or life-threatening deterioration.  Critical care was time spent personally by me on the following activities: development of treatment plan with patient  and/or surrogate as well as nursing, discussions with consultants, evaluation of patient's response to treatment, examination of patient, obtaining history from patient or surrogate, ordering and performing treatments and interventions, ordering and review of laboratory studies, ordering and review of radiographic studies, pulse oximetry and re-evaluation of patient's condition.  ____________________________________________   DIFFERENTIAL DIAGNOSIS  unstable angina, non-STEMI, pulmonary was him, pneumonia, pulmonary edema, pneumothorax  CLINICAL IMPRESSION / ASSESSMENT AND PLAN / ED COURSE  Pertinent labs & imaging results that were available during my care of the patient were reviewed by me and considered in my medical decision making (see chart for details).   patient presents with exertional chest pain and shortness of breath in the setting of severe underlying cardiac disease. He's been compliant with his medications including an egg regulation with Xarelto, but do this severity of her symptoms, start heparin and nitroglycerin drips. Premedicate with Benadryl and Solu-Medrol for repeat CT angiogram of the chest to evaluate for recurrent pulmonary embolism. Plan to hospitalize after initial stabilization for further management.EKG is not compatible with a STEMI. Chest x-ray is unremarkable.  Clinical Course as of Nov 07 1413  Tue Nov 07, 2016  1233 patient had sudden worsening of pain but this time and the left shoulder despite being at rest. Repeat EKG performed, grossly unchanged, normal sinus rhythm rate of 66, left axis, no acute ischemic changes.  [PS]  1348 Trop negative. Waiting for CTA Chest due to allergy/premedication protocol.  Will d/w hospitalist for further management.  [PS]    Clinical Course User Index [PS] Carrie Mew, MD     ----------------------------------------- 2:15 PM on 11/07/2016 -----------------------------------------  Tolerating nitro drip with  improvement of his pain, vitals remain stable. Case discussed with hospitalist for further management. Workup so far nondiagnostic although consistent with coagulopathy, likely therapeutic  ____________________________________________  FINAL CLINICAL IMPRESSION(S) / ED DIAGNOSES    Final diagnoses:  Unstable angina (HCC)  Precordial pain      New Prescriptions   No medications on file     Portions of this note were generated with dragon dictation software. Dictation errors may occur despite best attempts at proofreading.    Carrie Mew, MD 11/07/16 1415

## 2016-11-07 NOTE — ED Notes (Signed)
Patient and his wife given coke to drink. Dr. Joni Fears in agreement.

## 2016-11-07 NOTE — Consult Note (Signed)
Cardiology Consultation:   Patient ID: Scott [name]; [id number]; Mar 13, 1940   Admit date: 11/07/2016 Date of Consult: 11/07/2016  Primary Care Provider: Madelyn Brunner, MD Primary Cardiologist: Esmond Plants, MD, PhD Primary Electrophysiologist:  None   Patient Profile:   Scott [name] is a 76 y.o. 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  History of Present Illness:   Scott [name] reports that over the last 3-4 weeks, he has noticed progressive exertional chest pressure and shortness of breath. He had been quite active throughout much of the summer without any limitation. However, when walking or doing activities around his house, he has noted the aforementioned symptoms. He also notes occasional pain at rest. He reports that it is different than what he has felt in the past with his sternal/chest wall pain, though he has had numerous admissions for chest pain in the past without clear cardiac pathology. Scott [name] is also concerned about accompanying shortness of breath, which is more pronounced than at prior admissions, per his report. He notes occasional palpitations but denies lightheadedness as well as orthopnea, PND, and edema. He feels as though his weight has been stable. He has been compliant with his medications. Over the last 1-2 weeks, Scott [name] has been using sublingual nitroglycerin one to 2 times a day with improvement in his chest pain. However, today it was more persistent despite taking sublingual nitroglycerin, prompting him to come to the emergency  department.  In the emergency department, Scott [name] underwent CT of the chest, which showed resolution of the previously noted pulmonary embolism. No other acute abnormality was identified. Due to continued chest pain, Scott [name] was started on a nitroglycerin infusion as well as heparin (he last took rivaroxaban this morning). Scott [name] feels much better at this time but still has 5/10 chest pain that is reproducible with chest wall palpation. Echocardiogram has been ordered.  Past Medical History:  Diagnosis Date  . Arthritis    right hip, since a fall  . CAD (coronary artery disease)    a. 04/2012 Cath: 3VD->Med Rx;  b. 04/2013 PCI RCA (2 DES); c. 03/2014 PCI: LAD 80 (3.0x23 Xience Alpine DES), 30 ISR in RCA; d. 06/2014 CABG x 2 ([facility]) LIMA->LAD, VG->OM; e. 11/2014 Neg MV;  f. 12/2014 Cath ([facility]): patent grafts->Med Rx; g. 03/2015 MV ([facility]) low risk, EF 40%; h. 05/2015 MV: low risk; i. 08/2015 Cath: patent grafts, patent RCA/LAD stents, small LCX/OM->Med Rx.  . Cardiomyopathy, ischemic    a. 04/2012 Echo: EF 40-45%;  b. 08/2013 Echo: EF 45-50%, mild glob HK, mod lat/post HK, diast dysfxn, mildly dil LA, mild MR, nl RVSP; c. 01/2015 Echo: EF 40-45%, Gr 1 DD, mildly dil LA.  . Carotid arterial disease ([place])    a. 05/2013 Carotid U/S; bilat 40-50% ICA stenosis.  . Chronic Chest Pain   . Chronic systolic CHF (congestive heart failure) ([place])    a. 01/2015 Echo: EF 40-45%.  . Cough   . Dyspnea    pt. unsure of exertion cause- that creates SOB at times   . Headache 1970's   migraine  .  History of blood transfusion 2016   post op  . Hyperlipidemia   . Hypertension   . Iron deficiency anemia due to chronic blood loss 09/11/2016  . Myocardial infarction [facility])    unsure of when  . Pulmonary emboli ([place]) 07/2016    Past Surgical History:  Procedure Laterality Date  . CARDIAC CATHETERIZATION  05/01/2012  . CARDIAC CATHETERIZATION  04/2013   [facility];x3 stent  . CARDIAC CATHETERIZATION  01/11/15    [facility]  .  CARDIAC CATHETERIZATION N/A 09/16/2015   Procedure: LEFT HEART CATH AND CORS/GRAFTS ANGIOGRAPHY;  Surgeon: Minna Merritts, MD;  Location: [place] CV [facility];  Service: Cardiovascular;  Laterality: N/A;  . CARPAL TUNNEL RELEASE     right hand  . CATARACT EXTRACTION     LEFT  . CATARACT EXTRACTION W/PHACO Left 09/16/2014   Procedure: CATARACT EXTRACTION PHACO AND INTRAOCULAR LENS PLACEMENT (IOC);  Surgeon: Leandrew Koyanagi, MD;  Location: [place];  Service: Ophthalmology;  Laterality: Left;  . COLONOSCOPY    . COLONOSCOPY WITH PROPOFOL N/A 01/28/2016   Procedure: COLONOSCOPY WITH PROPOFOL;  Surgeon: Lollie Sails, MD;  Location: [facility] ENDOSCOPY;  Service: Endoscopy;  Laterality: N/A;  . CORONARY ANGIOPLASTY WITH STENT PLACEMENT  04/13/2014  . CORONARY ARTERY BYPASS GRAFT  06-26-14   x3 bypasses  . DE QUERVAIN'S RELEASE Left 08/22/2012  . ESOPHAGOGASTRODUODENOSCOPY (EGD) WITH PROPOFOL N/A 04/26/2015   Procedure: ESOPHAGOGASTRODUODENOSCOPY (EGD) WITH PROPOFOL;  Surgeon: Hulen Luster, MD;  Location: [facility] ENDOSCOPY;  Service: Gastroenterology;  Laterality: N/A;  . RIB PLATING N/A 05/25/2016   Procedure: STERNAL PLATING;  Surgeon: Melrose Nakayama, MD;  Location: [place];  Service: Thoracic;  Laterality: N/A;  . right shoulder    . STERNAL WIRES REMOVAL N/A 03/30/2016   Procedure: STERNAL WIRES REMOVAL;  Surgeon: Melrose Nakayama, MD;  Location: [place];  Service: Thoracic;  Laterality: N/A;  . TONSILLECTOMY       Home Medications:  Prior to Admission medications   Medication Sig Start Date Shaida Route Date Taking? Authorizing Provider  acetaminophen (TYLENOL) 500 MG tablet Take 1,000 mg by mouth every 6 (six) hours as needed for moderate pain.   Yes [provider]  atorvastatin (LIPITOR) 80 MG tablet Take 80 mg by mouth at bedtime.   Yes [provider]  carvedilol (COREG) 3.125 MG tablet Take 1 tablet (3.125 mg total) by mouth daily before breakfast. 05/15/16  Yes Gollan,  [name], MD  diphenhydramine-acetaminophen (TYLENOL PM) 25-500 MG TABS tablet Take 2 tablets by mouth at bedtime as needed. For pain/sleep   Yes [provider]  isosorbide mononitrate (IMDUR) 60 MG 24 hr tablet Take 60 mg by mouth daily before breakfast.    Yes [provider]  nitroGLYCERIN (NITROSTAT) 0.4 MG SL tablet Place 0.4 mg under the tongue every 5 (five) minutes as needed for chest pain.   Yes [provider]  ramipril (ALTACE) 2.5 MG capsule Take 2.5 mg by mouth daily.   Yes [provider]  traMADol (ULTRAM) 50 MG tablet Take 1-2 tablets (50-100 mg total) by mouth every 6 (six) hours as needed for moderate pain. Patient taking differently: Take 50 mg by mouth every 6 (six) hours as needed for moderate pain.  06/30/16  Yes Melrose Nakayama, MD  XARELTO 20 MG TABS tablet Take 1 tablet (20 mg total) by mouth daily. 10/23/16  Yes Gollan, [name], MD  zolpidem (AMBIEN) 5 MG tablet Take 5 mg by mouth at bedtime as  needed for sleep.    Yes [provider]    Inpatient Medications: Scheduled Meds: . atorvastatin  80 mg Oral QHS  . [START ON 11/08/2016] carvedilol  3.125 mg Oral QAC breakfast  . [START ON 11/08/2016] Influenza vac split quadrivalent PF  0.5 mL Intramuscular Tomorrow-1000  . [START ON 11/08/2016] isosorbide mononitrate  60 mg Oral QAC breakfast  . [START ON 11/08/2016] ramipril  2.5 mg Oral Daily   Continuous Infusions: . heparin 900 Units/hr (11/07/16 1301)   PRN Meds: acetaminophen, docusate sodium, traMADol, zolpidem  Allergies:    Allergies  Allergen Reactions  . Contrast Media [Iodinated Diagnostic Agents] Hives  . Oxycodone Hcl Itching  . Tramadol Itching  . Vicodin [Hydrocodone-Acetaminophen] Itching    Social History:   Social History   Social History  . Marital status: Married    Spouse name: N/A  . Number of children: N/A  . Years of education: N/A   Occupational History  . Not on file.    Social History Main Topics  . Smoking status: Former Smoker    Packs/day: 1.00    Years: 35.00    Types: Cigarettes    Start date: 05/02/2009  . Smokeless tobacco: Never Used     Comment: quit in 2006  . Alcohol use No  . Drug use: No  . Sexual activity: Yes   Other Topics Concern  . Not on file   Social History Narrative  . No narrative on file    Family History:   Family History  Problem Relation Age of Onset  . Heart attack Father 40       MI  . Cancer Maternal Uncle      ROS:  Review of Systems  Constitution: Positive for fever (wife reports 14 F temperature 3 days ago). Negative for weight gain and weight loss.  HENT: Negative.   Eyes: Negative.   Cardiovascular: Positive for chest pain, dyspnea on exertion and irregular heartbeat. Negative for leg swelling, orthopnea, paroxysmal nocturnal dyspnea and syncope.  Respiratory: Positive for cough, shortness of breath and sputum production (green for last few weeks).   Endocrine: Negative.   Hematologic/Lymphatic: Negative.   Skin: Negative.   Musculoskeletal: Negative.   Gastrointestinal: Negative.   Genitourinary: Negative.   Neurological: Negative.   Psychiatric/Behavioral: Negative.   Allergic/Immunologic: Negative.     Physical Exam/Data:   Vitals:   11/07/16 1606 11/07/16 1650 11/07/16 1700 11/07/16 1800  BP: 115/78 121/75 105/73 114/76  Pulse: 73 70 74 73  Resp: 17 16 17 15   Temp:      TempSrc:      SpO2: 95% 99% 97% 91%  Weight:      Height:       No intake or output data in the 24 hours ending 11/07/16 1825 Filed Weights   11/07/16 1132  Weight: 170 lb (77.1 kg)   Body mass index is 23.71 kg/m.  General:  Well nourished, well developed, in no acute distress. He is accompanied by multiple family members. HEENT: normal Lymph: no adenopathy Neck: no JVD or HJR Endocrine:  No thryomegaly or lymphadenopathy Vascular: No carotid bruits; 2+ radial, posterior tibial, dorsalis pedis pulses  bilaterally Cardiac:  normal S1, S2; RRR; no murmur or rubs Lungs: Normal work of breathing. Coarse breath sounds without wheezes or crackles. Abd: soft, nontender, no hepatomegaly  Ext: no edema Musculoskeletal:  No deformities, BUE and BLE strength normal and equal Skin: warm and dry  Neuro:  CNs 2-12 intact, no  focal abnormalities noted Psych:  Normal affect   EKG:  The EKG was personally reviewed and demonstrates: Normal sinus rhythm with nonspecific intraventricular conduction delay. Telemetry:  Telemetry was personally reviewed and demonstrates:  Normal sinus rhythm  Relevant CV Studies: Echo (08/05/16): - Left ventricle: The cavity size was normal. Systolic function was   normal. The estimated ejection fraction was in the range of 60%   to 65%. Wall motion was normal; there were no regional wall   motion abnormalities. - Mitral valve: There was mild regurgitation. - Left atrium: The atrium was mildly dilated. - Right ventricle: Systolic function was normal. - Pulmonary arteries: Systolic pressure was within the normal   range.  Laboratory Data:  Chemistry Recent Labs Lab 11/07/16 1134  NA 136  K 3.9  CL 102  CO2 25  GLUCOSE 98  BUN 22*  CREATININE 1.04  CALCIUM 9.2  GFRNONAA >60  GFRAA >60  ANIONGAP 9    No results for input(s): PROT, ALBUMIN, AST, ALT, ALKPHOS, BILITOT in the last 168 hours. Hematology Recent Labs Lab 11/07/16 1134  WBC 4.6  RBC 5.01  HGB 14.0  HCT 41.0  MCV 81.9  MCH 28.0  MCHC 34.2  RDW 24.2*  PLT 194   Cardiac Enzymes Recent Labs Lab 11/07/16 1134 11/07/16 1509  TROPONINI <0.03 <0.03   No results for input(s): TROPIPOC in the last 168 hours.  BNPNo results for input(s): BNP, PROBNP in the last 168 hours.  DDimer No results for input(s): DDIMER in the last 168 hours.  Radiology/Studies:  Dg Chest 2 View  Result Date: 11/07/2016 CLINICAL DATA:  Chest pain. EXAM: CHEST  2 VIEW COMPARISON:  Chest x-ray dated September 26, 2016. FINDINGS: Postsurgical changes related to prior CABG. The cardiomediastinal silhouette is normal in size. Normal pulmonary vascularity. No focal consolidation, pleural effusion, or pneumothorax. IMPRESSION: No active cardiopulmonary disease. Electronically Signed   By: Titus Dubin M.D.   On: 11/07/2016 11:53   Ct Angio Chest Pe W And/or Wo Contrast  Result Date: 11/07/2016 CLINICAL DATA:  Shortness of breath and chest pain EXAM: CT ANGIOGRAPHY CHEST WITH CONTRAST TECHNIQUE: Multidetector CT imaging of the chest was performed using the standard protocol during bolus administration of intravenous contrast. Multiplanar CT image reconstructions and MIPs were obtained to evaluate the vascular anatomy. CONTRAST:  75 mL Isovue 370 nonionic COMPARISON:  Chest CT August 08, 2016 and chest radiograph November 07, 2016 FINDINGS: Cardiovascular: Currently no pulmonary embolus is evident. Previous left upper lobe pulmonary embolus has resolved. There is no appreciable thoracic aortic aneurysm or dissection. There is mild atherosclerotic calcification in the proximal right common and left subclavian artery. Visualized great vessels otherwise appear unremarkable. There are scattered foci of thoracic aortic atherosclerosis. There is calcification in multiple native coronary arteries. There is evidence of previous median sternotomy with coronary artery bypass grafting. Pericardium is not appreciably thickened. Mediastinum/Nodes: Thyroid appears unremarkable. There are scattered subcentimeter mediastinal lymph nodes. There is no adenopathy by size criteria in the thoracic region. No esophageal lesions are appreciable. Lungs/Pleura: There is mild bibasilar atelectasis. There is no lung edema or consolidation. No pleural effusion or pleural thickening evident. Upper Abdomen: Visualized upper abdominal structures appear normal except for scattered foci of atherosclerotic calcification. Musculoskeletal: Previous median  sternotomy. Degenerative change noted in thoracic spine. There are no blastic or lytic bone lesions. Review of the MIP images confirms the above findings. IMPRESSION: 1. No pulmonary embolus. Previously noted pulmonary embolus in left upper lobe region  has resolved. 2. Foci of aortic and proximal great vessel atherosclerosis. Foci of native coronary artery calcification. Previous coronary artery bypass grafting. 3. No adenopathy by size criteria. Subcentimeter mediastinal lymph nodes are felt to be nonspecific. Aortic Atherosclerosis (ICD10-I70.0). Electronically Signed   By: Lowella Grip III M.D.   On: 11/07/2016 16:48    Assessment and Plan:   Unstable angina Mr. Langston describes worsening exertional chest pain and shortness of breath that are now present at rest over the last several weeks. He continues to have chest pain at this time despite being on a nitroglycerin infusion. However, the pain is also reproducible with chest wall palpation. It should be noted that Mr. Golladay has been hospitalized multiple times for chest pain and shortness of breath without objective evidence of myocardial ischemia. Given that he appears comfortable at this time with a stable EKG and negative troponins 2, I do not feel that urgent cardiac catheterization is necessary. We will continue to work on pain control and trend his troponins.  Continue to trend troponin until negative 3.  It is reasonable to continue with heparin in place of rivaroxaban in case invasive procedures are needed.  Titrate nitroglycerin infusion for relief of chest pain. Hopefully, this can be weaned off quickly. Addition of ranolazine could be considered.  Continue high-intensity statin.  It does not appear that Mr. Pitkin has received aspirin. However, in the setting of being on heparin and having taken rivaroxaban this morning, I will defer starting aspirin at this time.  Keep nothing by mouth after midnight in case ischemia  evaluation is necessary.  I will defer additional testing to Dr. Rockey Situ, who will be rounding tomorrow, as he knows Mr. Alkhatib quite well.  Ischemic cardiomyopathy Scott [name] appears euvolemic on exam, though he reports progressive exertional dyspnea and chest pain. Most recent echo in July showed normal LVEF. Repeat echo has already been ordered.  Follow-up echo.  Continue carvedilol.  History of pulmonary embolism CT chest today does not show any evidence of pulmonary embolism.  Heparin infusion in lieu of rivaroxaban for the time being.  I will defer need for ongoing anticoagulation to Dr. Rockey Situ, as Scott [name] has completed about 3 months of anticoagulation for his pulmonary embolism in July.  Paroxysmal atrial fibrillation The patient is in sinus rhythm.  Continue telemetry monitoring.  Continue anticoagulation.  Cough and fever Scott [name] reports productive cough over the last several weeks. His wife reports that her husband was febrile with a temperature of 10 24F a few days ago. He has been afebrile here. White blood cell count is also normal. CTA chest did not show any evidence of pneumonia.  Hypertension Blood pressure is low normal at this time on nitroglycerin infusion.  Wean off nitroglycerin infusion, as tolerated. This may allow for further escalation of isosorbide mononitrate.   Signed, Nelva Bush, MD  11/07/2016 6:25 PM

## 2016-11-07 NOTE — Progress Notes (Addendum)
ANTICOAGULATION CONSULT NOTE - Initial Consult  Pharmacy Consult for Heparin Drip  Indication: chest pain/ACS  Allergies  Allergen Reactions  . Contrast Media [Iodinated Diagnostic Agents] Hives  . Oxycodone Hcl Itching  . Tramadol Itching  . Vicodin [Hydrocodone-Acetaminophen] Itching    Patient Measurements: Height: 5\' 10"  (177.8 cm) Weight: 167 lb 15.9 oz (76.2 kg) IBW/kg (Calculated) : 73  Vital Signs: Temp: 98 F (36.7 C) (10/16 1900) Temp Source: Oral (10/16 1900) BP: 105/71 (10/16 2100) Pulse Rate: 70 (10/16 2100)  Labs:  Recent Labs  11/07/16 1134 11/07/16 1216 11/07/16 1509 11/07/16 1851 11/07/16 2100  HGB 14.0  --   --   --   --   HCT 41.0  --   --   --   --   PLT 194  --   --   --   --   APTT  --  44*  --   --  134*  LABPROT  --  27.7*  --   --   --   INR  --  2.61  --   --   --   HEPARINUNFRC  --  >3.60*  --   --   --   CREATININE 1.04  --   --   --   --   TROPONINI <0.03  --  <0.03 <0.03 <0.03    Estimated Creatinine Clearance: 64.3 mL/min (by C-G formula based on SCr of 1.04 mg/dL).   Medical History: Past Medical History:  Diagnosis Date  . Arthritis    right hip, since a fall  . CAD (coronary artery disease)    a. 04/2012 Cath: 3VD->Med Rx;  b. 04/2013 PCI RCA (2 DES); c. 03/2014 PCI: LAD 80 (3.0x23 Xience Alpine DES), 30 ISR in RCA; d. 06/2014 CABG x 2 ([facility]) LIMA->LAD, VG->OM; e. 11/2014 Neg MV;  f. 12/2014 Cath ([facility]): patent grafts->Med Rx; g. 03/2015 MV ([facility]) low risk, EF 40%; h. 05/2015 MV: low risk; i. 08/2015 Cath: patent grafts, patent RCA/LAD stents, small LCX/OM->Med Rx.  . Cardiomyopathy, ischemic    a. 04/2012 Echo: EF 40-45%;  b. 08/2013 Echo: EF 45-50%, mild glob HK, mod lat/post HK, diast dysfxn, mildly dil LA, mild MR, nl RVSP; c. 01/2015 Echo: EF 40-45%, Gr 1 DD, mildly dil LA.  . Carotid arterial disease ([place])    a. 05/2013 Carotid U/S; bilat 40-50% ICA stenosis.  . Chronic Chest Pain   . Chronic systolic CHF (congestive heart  failure) ([place])    a. 01/2015 Echo: EF 40-45%.  . Cough   . Dyspnea    pt. unsure of exertion cause- that creates SOB at times   . Headache 1970's   migraine  . History of blood transfusion 2016   post op  . Hyperlipidemia   . Hypertension   . Iron deficiency anemia due to chronic blood loss 09/11/2016  . Myocardial infarction [facility])    unsure of when  . Pulmonary emboli ([place]) 07/2016    Assessment: Pharmacy consulted for heparin dosing and monitoring in a 76yo male with Chest Pain. Patient takes Xarelto (Rivaroxaban) for PMH of PE (July 2018), last dose was this morning 10/16 @ around 0800.   Goal of Therapy:  Heparin level 0.3-0.7 units/ml aPTT 66-102 seconds Monitor platelets by anticoagulation protocol: Yes   Plan:  Baseline labs ordered, according to baseline heparin level (Anti-Xa level).   Will not give a bolus due to patient taking Xarelto this morning.   Start heparin infusion at 900 units/hr Check  anti-Xa level and aPTT in 8 hours and daily while on heparin.  Will need to adjust dose based on aPTT levels until anti-Xa levels and aPTT levels correlate.  Continue to monitor H&H and platelets  10/16:  APTT @ 21:00 = 134 Will decrease rate to 800 units/hr and recheck aPTT 6 hrs after rate change.    Robbins,Jason D Clinical Pharmacist 11/07/2016 9:52 PM  10/17 0400 aPTT 118. Decrease rate to 700 units/hr and recheck aPTT in 6 hours.  Sim Boast, PharmD, BCPS  11/08/16 7:23 AM

## 2016-11-07 NOTE — H&P (Signed)
[facility] NAME: Scott [name]    MR#:  [id number]  DATE OF BIRTH:  [birth year]  DATE OF ADMISSION:  11/07/2016  PRIMARY CARE PHYSICIAN: Madelyn Brunner, MD   REQUESTING/REFERRING PHYSICIAN: Joni Fears  CHIEF COMPLAINT:   Chief Complaint  Patient presents with  . Chest Pain    HISTORY OF PRESENT ILLNESS: Scott [name]  is a 76 y.o. male with a known history of CAD, Ischemic Cardiomyopathy, PE in July 2018, Hyperlipidemia, Hypertension- takes xarelto at home. For last few weeks had chest pressure and SOB with activities, which is relieved by Nitro tablet. Today morning, He had to take 3 nitro tablets, still it did not get relieved, so he decided to come to ER. Pain relieved after getting Nitro IV drip in ER. Pain is in central chest, pressure like , with SOB, radiated to his left shoulder. Troponin and EKG are negative, but due to typical pain, ER started on Heparin IV drip.  PAST MEDICAL HISTORY:   Past Medical History:  Diagnosis Date  . Arthritis    right hip, since a fall  . CAD (coronary artery disease)    a. 04/2012 Cath: 3VD->Med Rx;  b. 04/2013 PCI RCA (2 DES); c. 03/2014 PCI: LAD 80 (3.0x23 Xience Alpine DES), 30 ISR in RCA; d. 06/2014 CABG x 2 ([facility]) LIMA->LAD, VG->OM; e. 11/2014 Neg MV;  f. 12/2014 Cath ([facility]): patent grafts->Med Rx; g. 03/2015 MV ([facility]) low risk, EF 40%; h. 05/2015 MV: low risk; i. 08/2015 Cath: patent grafts, patent RCA/LAD stents, small LCX/OM->Med Rx.  . Cardiomyopathy, ischemic    a. 04/2012 Echo: EF 40-45%;  b. 08/2013 Echo: EF 45-50%, mild glob HK, mod lat/post HK, diast dysfxn, mildly dil LA, mild MR, nl RVSP; c. 01/2015 Echo: EF 40-45%, Gr 1 DD, mildly dil LA.  . Carotid arterial disease ([place])    a. 05/2013 Carotid U/S; bilat 40-50% ICA stenosis.  . Chronic Chest Pain   . Chronic systolic CHF (congestive heart failure) ([place])    a. 01/2015 Echo: EF 40-45%.  . Cough   . Dyspnea    pt. unsure of exertion  cause- that creates SOB at times   . Headache 1970's   migraine  . History of blood transfusion 2016   post op  . Hyperlipidemia   . Hypertension   . Iron deficiency anemia due to chronic blood loss 09/11/2016  . Myocardial infarction [facility])    unsure of when  . Pulmonary emboli ([place]) 07/2016    PAST SURGICAL HISTORY: Past Surgical History:  Procedure Laterality Date  . CARDIAC CATHETERIZATION  05/01/2012  . CARDIAC CATHETERIZATION  04/2013   [facility];x3 stent  . CARDIAC CATHETERIZATION  01/11/15    [facility]  . CARDIAC CATHETERIZATION N/A 09/16/2015   Procedure: LEFT HEART CATH AND CORS/GRAFTS ANGIOGRAPHY;  Surgeon: Minna Merritts, MD;  Location: [facility];  Service: Cardiovascular;  Laterality: N/A;  . CARPAL TUNNEL RELEASE     right hand  . CATARACT EXTRACTION     LEFT  . CATARACT EXTRACTION W/PHACO Left 09/16/2014   Procedure: CATARACT EXTRACTION PHACO AND INTRAOCULAR LENS PLACEMENT (IOC);  Surgeon: Leandrew Koyanagi, MD;  Location: [facility];  Service: Ophthalmology;  Laterality: Left;  . COLONOSCOPY    . COLONOSCOPY WITH PROPOFOL N/A 01/28/2016   Procedure: COLONOSCOPY WITH PROPOFOL;  Surgeon: Lollie Sails, MD;  Location[facility] END[facility]COPY;  Service: Endoscopy;  Laterality: N/A;  . CORONARY ANGIOPLASTY WITH STENT  PLACEMENT  04/13/2014  . CORONARY ARTERY BYPASS GRAFT  06-26-14   x3 bypasses  . DE QUERVAIN'S RELEASE Left 08/22/2012  . ESOPHAGOGASTRODUODENOSCOPY (EGD) WITH PROPOFOL N/A 04/26/2015   Procedure: ESOPHAGOGASTRODUODENOSCOPY (EGD) WITH PROPOFOL;  Surgeon: Hulen Luster, MD;  Location: [facility] ENDOSCOPY;  Service: Gastroenterology;  Laterality: N/A;  . RIB PLATING N/A 05/25/2016   Procedure: STERNAL PLATING;  Surgeon: Melrose Nakayama, MD;  Location: [facility];  Service: Thoracic;  Laterality: N/A;  . right shoulder    . STERNAL WIRES REMOVAL N/A 03/30/2016   Procedure: STERNAL WIRES REMOVAL;  Surgeon: Melrose Nakayama, MD;  Location: [facility];  Service: Thoracic;   Laterality: N/A;  . TONSILLECTOMY      SOCIAL HISTORY:  Social History  Substance Use Topics  . Smoking status: Former Smoker    Packs/day: 1.00    Years: 35.00    Types: Cigarettes    Start date: 05/02/2009  . Smokeless tobacco: Never Used     Comment: quit in 2006  . Alcohol use No    FAMILY HISTORY:  Family History  Problem Relation Age of Onset  . Heart attack Father 67       MI  . Cancer Maternal Uncle     DRUG ALLERGIES:  Allergies  Allergen Reactions  . Contrast Media [Iodinated Diagnostic Agents] Hives  . Oxycodone Hcl Itching  . Tramadol Itching  . Vicodin [Hydrocodone-Acetaminophen] Itching    REVIEW OF SYSTEMS:   CONSTITUTIONAL: No fever, fatigue or weakness.  EYES: No blurred or double vision.  EARS, NOSE, AND THROAT: No tinnitus or ear pain.  RESPIRATORY: No cough, shortness of breath, wheezing or hemoptysis.  CARDIOVASCULAR: positive for chest pain,no orthopnea, edema.  GASTROINTESTINAL: No nausea, vomiting, diarrhea or abdominal pain.  GENITOURINARY: No dysuria, hematuria.  ENDOCRINE: No polyuria, nocturia,  HEMATOLOGY: No anemia, easy bruising or bleeding SKIN: No rash or lesion. MUSCULOSKELETAL: No joint pain or arthritis.   NEUROLOGIC: No tingling, numbness, weakness.  PSYCHIATRY: No anxiety or depression.   MEDICATIONS AT HOME:  Prior to Admission medications   Medication Sig Start Date End Date Taking? Authorizing Provider  acetaminophen (TYLENOL) 500 MG tablet Take 1,000 mg by mouth every 6 (six) hours as needed for moderate pain.   Yes [provider]  atorvastatin (LIPITOR) 80 MG tablet Take 80 mg by mouth at bedtime.   Yes [provider]  carvedilol (COREG) 3.125 MG tablet Take 1 tablet (3.125 mg total) by mouth daily before breakfast. 05/15/16  Yes Gollan, [name], MD  diphenhydramine-acetaminophen (TYLENOL PM) 25-500 MG TABS tablet Take 2 tablets by mouth at bedtime as needed. For pain/sleep   Yes [provider]  isosorbide mononitrate (IMDUR) 60 MG 24 hr tablet Take 60 mg by mouth daily before breakfast.    Yes [provider]  nitroGLYCERIN (NITROSTAT) 0.4 MG SL tablet Place 0.4 mg under the tongue every 5 (five) minutes as needed for chest pain.   Yes [provider]  ramipril (ALTACE) 2.5 MG capsule Take 2.5 mg by mouth daily.   Yes [provider]  traMADol (ULTRAM) 50 MG tablet Take 1-2 tablets (50-100 mg total) by mouth every 6 (six) hours as needed for moderate pain. Patient taking differently: Take 50 mg by mouth every 6 (six) hours as needed for moderate pain.  06/30/16  Yes Melrose Nakayama, MD  XARELTO 20 MG TABS tablet Take 1 tablet (20 mg total) by mouth daily. 10/23/16  Yes Minna Merritts, MD  zolpidem (AMBIEN) 5 MG tablet Take 5 mg by mouth at bedtime as needed for sleep.    Yes [provider]      PHYSICAL EXAMINATION:   VITAL SIGNS: Blood pressure 103/68, pulse 81, temperature 97.7 F (36.5 C), temperature source Oral, resp. rate (!) 24, height 5\' 11"  (1.803 m), weight 77.1 kg (170 lb), SpO2 97 %.  GENERAL:  76 y.o.-year-old patient lying in the bed with no acute distress.  EYES: Pupils equal, round, reactive to light and accommodation. No scleral icterus. Extraocular muscles intact.  HEENT: Head atraumatic, normocephalic. Oropharynx and nasopharynx clear.  NECK:  Supple, no jugular venous distention. No thyroid enlargement, no tenderness.  LUNGS: Normal breath sounds bilaterally, no wheezing, rales,rhonchi or crepitation. No use of accessory muscles of respiration.  CARDIOVASCULAR: S1, S2 normal. No murmurs, rubs, or gallops.  ABDOMEN: Soft, nontender, nondistended. Bowel sounds present. No organomegaly or mass.  EXTREMITIES: No pedal edema, cyanosis, or clubbing.  NEUROLOGIC: Cranial nerves II through XII are intact. Muscle strength 5/5 in all extremities. Sensation intact. Gait not checked.  PSYCHIATRIC: The patient is  alert and oriented x 3.  SKIN: No obvious rash, lesion, or ulcer.   LABORATORY PANEL:   CBC  Recent Labs Lab 11/07/16 1134  WBC 4.6  HGB 14.0  HCT 41.0  PLT 194  MCV 81.9  MCH 28.0  MCHC 34.2  RDW 24.2*   ------------------------------------------------------------------------------------------------------------------  Chemistries   Recent Labs Lab 11/07/16 1134  NA 136  K 3.9  CL 102  CO2 25  GLUCOSE 98  BUN 22*  CREATININE 1.04  CALCIUM 9.2   ------------------------------------------------------------------------------------------------------------------ estimated creatinine clearance is 66.4 mL/min (by C-G formula based on SCr of 1.04 mg/dL). ------------------------------------------------------------------------------------------------------------------ No results for input(s): TSH, T4TOTAL, T3FREE, THYROIDAB in the last 72 hours.  Invalid input(s): FREET3   Coagulation profile  Recent Labs Lab 11/07/16 1216  INR 2.61   ------------------------------------------------------------------------------------------------------------------- No results for input(s): DDIMER in the last 72 hours. -------------------------------------------------------------------------------------------------------------------  Cardiac Enzymes  Recent Labs Lab 11/07/16 1134  TROPONINI <0.03   ------------------------------------------------------------------------------------------------------------------ Invalid input(s): POCBNP  ---------------------------------------------------------------------------------------------------------------  Urinalysis    Component Value Date/Time   COLORURINE YELLOW (A) 08/31/2016 1457   APPEARANCEUR CLEAR (A) 08/31/2016 1457   LABSPEC 1.016 08/31/2016 1457   PHURINE 7.0 08/31/2016 1457   GLUCOSEU NEGATIVE 08/31/2016 1457   HGBUR NEGATIVE 08/31/2016 1457   BILIRUBINUR NEGATIVE 08/31/2016 1457   KETONESUR NEGATIVE 08/31/2016  1457   PROTEINUR NEGATIVE 08/31/2016 1457   NITRITE NEGATIVE 08/31/2016 1457   LEUKOCYTESUR NEGATIVE 08/31/2016 1457     RADIOLOGY: Dg Chest 2 View  Result Date: 11/07/2016 CLINICAL DATA:  Chest pain. EXAM: CHEST  2 VIEW COMPARISON:  Chest x-ray dated September 26, 2016. FINDINGS: Postsurgical changes related to prior CABG. The cardiomediastinal silhouette is normal in size. Normal pulmonary vascularity. No focal consolidation, pleural effusion, or pneumothorax. IMPRESSION: No active cardiopulmonary disease. Electronically Signed   By: Titus Dubin M.D.   On: 11/07/2016 11:53    EKG: Orders placed or performed during the hospital encounter of 11/07/16  . ED EKG within 10 minutes  . ED EKG within 10 minutes  . EKG 12-Lead  . EKG 12-Lead  . EKG 12-Lead  . EKG 12-Lead  . ED EKG  . ED EKG    IMPRESSION AND PLAN:  * Anginal chest pain   Hx of CAD    Pain is typical   Cont Nitro drip, monitor in step down.   Follow serial troponin and tele.  Get Echo, check Lipid and HBA1c   Cardio consult.  * Hx of PE   On xarelto.    INR is >2, but as may need cath- hold xarelto and start on heparin drip.  * Hx of CAD with ischemic cardiomyopathy.   Cont carvedilol and ramipril, atorvastatin.   Cardio consult.  * Hyperlipidemia    Cont atorvastatin.  All the records are reviewed and case discussed with ED provider. Management plans discussed with the patient, family and they are in agreement.  CODE STATUS: Full code. Code Status History    Date Active Date Inactive Code Status Order ID Comments User Context   08/04/2016  3:35 PM 08/09/2016  5:23 PM Full Code [id number]  Epifanio Lesches, MD ED   05/25/2016  2:22 PM 05/26/2016  5:12 PM Full Code [id number]  Nani Skillern, PA-C Inpatient   02/08/2015  1:07 PM 02/09/2015  6:43 PM Full Code [id number]  Epifanio Lesches, MD ED   12/07/2014  2:23 PM 12/08/2014  6:52 PM Full Code [id number]  Fritzi Mandes, MD Inpatient        TOTAL TIME TAKING CARE OF THIS PATIENT: 50 minutes.  Spoke to his wife in room.  Vaughan Basta M.D on 11/07/2016   Between 7am to 6pm - Pager - [phone]  After 6pm go to [email or web address] - password [email or web address] [facility]  Office  [phone]  CC: Primary care physician; Madelyn Brunner, MD   Note: This dictation was prepared with Dragon dictation along with smaller phrase technology. Any transcriptional errors that result from this process are unintentional.

## 2016-11-08 ENCOUNTER — Telehealth: Payer: Self-pay

## 2016-11-08 ENCOUNTER — Telehealth: Payer: Self-pay | Admitting: *Deleted

## 2016-11-08 ENCOUNTER — Observation Stay (HOSPITAL_BASED_OUTPATIENT_CLINIC_OR_DEPARTMENT_OTHER): Payer: Medicare HMO

## 2016-11-08 DIAGNOSIS — I2510 Atherosclerotic heart disease of native coronary artery without angina pectoris: Secondary | ICD-10-CM | POA: Diagnosis not present

## 2016-11-08 DIAGNOSIS — E785 Hyperlipidemia, unspecified: Secondary | ICD-10-CM | POA: Diagnosis not present

## 2016-11-08 DIAGNOSIS — I255 Ischemic cardiomyopathy: Secondary | ICD-10-CM | POA: Diagnosis not present

## 2016-11-08 DIAGNOSIS — I209 Angina pectoris, unspecified: Secondary | ICD-10-CM | POA: Diagnosis not present

## 2016-11-08 DIAGNOSIS — I2699 Other pulmonary embolism without acute cor pulmonale: Secondary | ICD-10-CM | POA: Diagnosis not present

## 2016-11-08 DIAGNOSIS — R072 Precordial pain: Secondary | ICD-10-CM | POA: Diagnosis not present

## 2016-11-08 DIAGNOSIS — R079 Chest pain, unspecified: Secondary | ICD-10-CM | POA: Diagnosis not present

## 2016-11-08 LAB — NM MYOCAR MULTI W/SPECT W/WALL MOTION / EF
CHL CUP NUCLEAR SSS: 9
CSEPHR: 55 %
LV dias vol: 112 mL (ref 62–150)
LV sys vol: 50 mL
Peak HR: 81 {beats}/min
Rest HR: 70 {beats}/min
SRS: 4

## 2016-11-08 LAB — CBC
HCT: 40.4 % (ref 40.0–52.0)
HEMOGLOBIN: 13.4 g/dL (ref 13.0–18.0)
MCH: 27.4 pg (ref 26.0–34.0)
MCHC: 33.1 g/dL (ref 32.0–36.0)
MCV: 82.7 fL (ref 80.0–100.0)
PLATELETS: 214 10*3/uL (ref 150–440)
RBC: 4.89 MIL/uL (ref 4.40–5.90)
RDW: 23.9 % — ABNORMAL HIGH (ref 11.5–14.5)
WBC: 7 10*3/uL (ref 3.8–10.6)

## 2016-11-08 LAB — HEPARIN LEVEL (UNFRACTIONATED): Heparin Unfractionated: >3.6 IU/mL — ABNORMAL HIGH (ref 0.30–0.70)

## 2016-11-08 LAB — BASIC METABOLIC PANEL
ANION GAP: 5 (ref 5–15)
BUN: 23 mg/dL — ABNORMAL HIGH (ref 6–20)
CHLORIDE: 105 mmol/L (ref 101–111)
CO2: 25 mmol/L (ref 22–32)
CREATININE: 1.16 mg/dL (ref 0.61–1.24)
Calcium: 8.7 mg/dL — ABNORMAL LOW (ref 8.9–10.3)
GFR calc non Af Amer: >60 mL/min (ref >60)
Glucose, Bld: 130 mg/dL — ABNORMAL HIGH (ref 65–99)
POTASSIUM: 4.3 mmol/L (ref 3.5–5.1)
SODIUM: 135 mmol/L (ref 135–145)

## 2016-11-08 LAB — APTT: APTT: 118 s — AB (ref 24–36)

## 2016-11-08 MED ORDER — TECHNETIUM TC 99M TETROFOSMIN IV KIT
13.4200 | PACK | Freq: Once | INTRAVENOUS | Status: AC | PRN
  Administered 2016-11-08: 13.42 via INTRAVENOUS

## 2016-11-08 MED ORDER — RIVAROXABAN 20 MG PO TABS
20.0000 mg | ORAL_TABLET | Freq: Every day | ORAL | Status: DC
  Filled 2016-11-08: qty 1

## 2016-11-08 MED ORDER — ISOSORBIDE MONONITRATE ER 30 MG PO TB24
90.0000 mg | ORAL_TABLET | Freq: Every day | ORAL | Status: DC
  Administered 2016-11-08: 90 mg via ORAL
  Filled 2016-11-08: qty 3

## 2016-11-08 MED ORDER — REGADENOSON 0.4 MG/5ML IV SOLN
0.4000 mg | Freq: Once | INTRAVENOUS | Status: AC
  Administered 2016-11-08: 0.4 mg via INTRAVENOUS

## 2016-11-08 MED ORDER — RANOLAZINE ER 500 MG PO TB12
500.0000 mg | ORAL_TABLET | Freq: Two times a day (BID) | ORAL | Status: DC
  Administered 2016-11-08: 500 mg via ORAL
  Filled 2016-11-08 (×2): qty 1

## 2016-11-08 MED ORDER — TECHNETIUM TC 99M TETROFOSMIN IV KIT
30.0000 | PACK | Freq: Once | INTRAVENOUS | Status: AC | PRN
  Administered 2016-11-08: 31.74 via INTRAVENOUS

## 2016-11-08 MED ORDER — HEPARIN (PORCINE) IN NACL 100-0.45 UNIT/ML-% IJ SOLN
700.0000 [IU]/h | INTRAMUSCULAR | Status: DC
  Administered 2016-11-07: 800 [IU]/h via INTRAVENOUS

## 2016-11-08 NOTE — Care Management Obs Status (Signed)
[facility] NOTIFICATION   Patient Details  Name: CORDARYL DECELLES MRN: [id number] Date of Birth: 12/15/1940   Medicare Observation Status Notification Given:  Yes    Marshell Garfinkel, RN 11/08/2016, 1:35 PM

## 2016-11-08 NOTE — Telephone Encounter (Signed)
-----   Message from Blain Pais sent at 11/08/2016  2:52 PM EDT ----- Regarding: tcm/ph 11/2 10:20 Dr. Rockey Situ

## 2016-11-08 NOTE — Telephone Encounter (Signed)
Patient contacted regarding discharge from [facility] on 11/08/16.  Patient understands to follow up with provider Dr. Rockey Situ on 11/24/16 at 10:20 AM at [facility]. Patient understands discharge instructions? Yes Patient understands medications and regiment? Yes Patient understands to bring all medications to this visit? Yes  Spoke with patient and he verbalized understanding with no further questions at this time.

## 2016-11-08 NOTE — Telephone Encounter (Addendum)
Per Dr. Rockey Situ need to give patient samples of Ranexa 500 mg take one tablet twice a day for one week, then increase to 1000 mg twice a day.  Samples given Ranexa 500 mg Lot# [id number] Exp. 9/20. Gave 3 boxes of 14 tablets in each.  The patient was given instructions on how to take medication.  The patient will call the office if has any further questions regarding the Ranexa.    Letter of instructions given to patient on how to take Ranexa samples that are provided.

## 2016-11-08 NOTE — Discharge Summary (Signed)
Physician Discharge Summary  Patient ID: Scott [name] MRN: [id number] DOB/AGE: [birth year] 76 y.o.  Admit date: 11/07/2016 Discharge date: 11/08/2016    Discharge Diagnoses:       Stable Angina      Ischemic Cardiomyopathy      Paroxysmal Atrial Fibrillation       Cough and Fever likely secondary to Upper Respiratory Infection       Hypertension                                                                       DISCHARGE PLAN BY DIAGNOSIS    Stable Angina  Ischemic Cardiomyopathy Paroxysmal Atrial Fibrillation  Hypertension  Plan: Myoview negative 11/08/16, troponin's negative x3, and CT Angio Chest negative for pulmonary embolism-he has a history of chronic chest pain Continue outpatient atorvastatin, carvedilol, isosorbide mononitrate, ramipril, xarelto, and prn sublingual nitroglycerin for chest pain    Per Cardiology recommendations pt to continue ranolazine  Follow-up appointment with Cardiologist Dr. Rockey Situ   Cough and Fever likely secondary to Upper Respiratory Infection  Plan: Likely viral infection no indication for antibiotics Instructed pt if symptoms worsen or persist notify PCP or proceed to the ER                DISCHARGE SUMMARY   Scott [name] is a 76 y.o. y/o male with a PMH of Arthritis, CAD s/p 2 vessel CABG [facility] ([facility]), Ischemic Cardiomyopathy, Pulmonary Emboli, Myocardial Infarction, HTN, Hyperlipidemia, Chronic Systolic CHF, Iron Deficiency Anemia, Cough, and Chronic Chest Pain.  He presented to [facility] ER 10/16 with c/o worsening chest pressure and shortness of breath onset of symptoms 3-4 wks prior to presentation.  He also endorsed having a productive cough with yellow sputum  and fevers onset several days prior to ER visit.  Due to chest pain he took 3 doses of sublingual nitroglycerin and his scheduled Xarelto without relief of symptoms. Upon presentation to the ER the chest pain persisted, therefore a nitroglycerin gtt was initiated and he was  admitted to the stepdown unit.  CT Angio Chest on 10/16 negative for pulmonary embolism.  Cardiology consulted 10/16 pt started on a heparin gtt, serial troponin's negative x3. On 10/17 he underwent a Myoview results negative per Cardiology pt stable for discharge home and instructed to follow-up with Dr. Rockey Situ in the office.  Pt currently denies chest pain or shortness of breath.   SIGNIFICANT DIAGNOSTIC STUDIES CT Angio Chest 10/16>>No pulmonary embolus. Previously noted pulmonary embolus in left upper lobe region has resolved. Foci of aortic and proximal great vessel atherosclerosis. Foci of native coronary artery calcification. Previous coronary artery bypass grafting. No adenopathy by size criteria. Subcentimeter mediastinal lymph nodes are felt to be nonspecific. Aortic Atherosclerosis  SIGNIFICANT EVENTS 10/16-Pt admitted to stepdown unit  MICRO DATA  None   ANTIBIOTICS None   CONSULTS Cardiology Intensivist  TUBES / LINES None    Discharge Exam: General: well developed, well nourished male resting in bed, in NAD. Neuro: A&O x 3, non-focal.  HEENT: [place]/AT. PERRL, sclerae anicteric. Cardiovascular: RRR, no M/R/G.  Lungs: Clear throughout, respirations even and unlabored Abdomen: BS x 4, soft, NT/ND.  Musculoskeletal: No gross deformities, no edema.  Skin: Intact, warm, no rashes.  Vitals:   11/08/16 0600 11/08/16 0700 11/08/16 0800 11/08/16 0900  BP: (!) 92/53 (!) 104/54 (!) 112/95 128/77  Pulse: 78 70 70   Resp: 12 (!) 23 (!) 23 16  Temp:   98.1 F (36.7 C)   TempSrc:   Oral   SpO2: 94% (!) 89% 95% 97%  Weight:      Height:         Discharge Labs  BMET  Recent Labs Lab 11/07/16 1134 11/07/16 2100 11/08/16 0402  NA 136  --  135  K 3.9  --  4.3  CL 102  --  105  CO2 25  --  25  GLUCOSE 98  --  130*  BUN 22*  --  23*  CREATININE 1.04  --  1.16  CALCIUM 9.2  --  8.7*  MG  --  1.8  --   PHOS  --  2.5  --     CBC  Recent Labs Lab  11/07/16 1134 11/08/16 0402  HGB 14.0 13.4  HCT 41.0 40.4  WBC 4.6 7.0  PLT 194 214    Anti-Coagulation  Recent Labs Lab 11/07/16 1216  INR 2.61    Discharge Instructions    Diet - low sodium heart healthy    Complete by:  As directed    Increase activity slowly    Complete by:  As directed        Follow-up Information    Gollan, [name], MD. Go to.   Specialty:  Cardiology Why:  Friday Nov. 2, 2018 at 10:20 am  Contact information: [address] [place] [address] [phone]            Allergies as of 11/08/2016      Reactions   Contrast Media [iodinated Diagnostic Agents] Hives   Oxycodone Hcl Itching   Tramadol Itching   Vicodin [hydrocodone-acetaminophen] Itching      Medication List    TAKE these medications   acetaminophen 500 MG tablet Commonly known as:  TYLENOL Take 1,000 mg by mouth every 6 (six) hours as needed for moderate pain.   atorvastatin 80 MG tablet Commonly known as:  LIPITOR Take 80 mg by mouth at bedtime.   carvedilol 3.125 MG tablet Commonly known as:  COREG Take 1 tablet (3.125 mg total) by mouth daily before breakfast.   diphenhydramine-acetaminophen 25-500 MG Tabs tablet Commonly known as:  TYLENOL PM Take 2 tablets by mouth at bedtime as needed. For pain/sleep   isosorbide mononitrate 60 MG 24 hr tablet Commonly known as:  IMDUR Take 60 mg by mouth daily before breakfast.   nitroGLYCERIN 0.4 MG SL tablet Commonly known as:  NITROSTAT Place 0.4 mg under the tongue every 5 (five) minutes as needed for chest pain.   ramipril 2.5 MG capsule Commonly known as:  ALTACE Take 2.5 mg by mouth daily.   ranolazine 1000 MG SR tablet Commonly known as:  RANEXA Take 1 tablet (1,000 mg total) by mouth 2 (two) times daily.   traMADol 50 MG tablet Commonly known as:  ULTRAM Take 1-2 tablets (50-100 mg total) by mouth every 6 (six) hours as needed for moderate pain. What changed:  how much to take    XARELTO 20 MG Tabs tablet Generic drug:  rivaroxaban Take 1 tablet (20 mg total) by mouth daily.   zolpidem 5 MG tablet Commonly known as:  AMBIEN Take 5 mg by mouth at bedtime as needed for sleep.  Disposition: Home   Discharged Condition: Scott [name] has met maximum benefit of inpatient care and is medically stable and cleared for discharge.  Patient is pending follow up as above.     Marda Stalker, [place] Pager [phone] (please enter 7 digits) PCCM Consult Pager [phone] (please enter 7 digits)  PCCM ATTENDING ATTESTATION:  I have evaluated patient with the APP [place], reviewed database in its entirety and discussed care plan in detail. In addition, this patient was discussed on multidisciplinary rounds. I agree with the above findings documentation and plan.  Merton Border, MD PCCM service Mobile [phone] Pager [phone] 11/08/2016 4:00 PM

## 2016-11-08 NOTE — Progress Notes (Signed)
Progress Note  Patient Name: Scott [name] Date of Encounter: 11/08/2016  Primary Cardiologist: Rockey Situ  Subjective   No further chest pain. Notes symptoms began when he was out raking hay and riding in his tractor. Ruled out with troponin negative x 4. Echo pending. Up sitting in bed reading the paper conversing with his wife in step down unit.   Inpatient Medications    Scheduled Meds: . atorvastatin  80 mg Oral QHS  . carvedilol  3.125 mg Oral QAC breakfast  . Influenza vac split quadrivalent PF  0.5 mL Intramuscular Tomorrow-1000  . isosorbide mononitrate  60 mg Oral QAC breakfast  . ramipril  2.5 mg Oral Daily   Continuous Infusions: . heparin 800 Units/hr (11/08/16 0650)  . nitroGLYCERIN 90 mcg/min (11/08/16 0522)   PRN Meds: acetaminophen, alum & mag hydroxide-simeth, docusate sodium, morphine injection, traMADol, zolpidem   Vital Signs    Vitals:   11/08/16 0200 11/08/16 0300 11/08/16 0400 11/08/16 0500  BP: 109/68 106/60 98/60 96/62   Pulse: 80 77 80 79  Resp: 18 14 13  (!) 25  Temp:      TempSrc:      SpO2: 94% 92% 92% 92%  Weight:      Height:        Intake/Output Summary (Last 24 hours) at 11/08/16 0723 Last data filed at 11/08/16 0522  Gross per 24 hour  Intake           1079.1 ml  Output                0 ml  Net           1079.1 ml   Filed Weights   11/07/16 1132 11/07/16 1606  Weight: 170 lb (77.1 kg) 167 lb 15.9 oz (76.2 kg)    Telemetry    NSR - Personally Reviewed  ECG    n/a - Personally Reviewed  Physical Exam   GEN: No acute distress.   Neck: No JVD Cardiac: RRR, no murmurs, rubs, or gallops.  Respiratory: Clear to auscultation bilaterally. GI: Soft, nontender, non-distended  MS: No edema; No deformity. Neuro:  Nonfocal  Psych: Normal affect   Labs    Chemistry Recent Labs Lab 11/07/16 1134 11/08/16 0402  NA 136 135  K 3.9 4.3  CL 102 105  CO2 25 25  GLUCOSE 98 130*  BUN 22* 23*  CREATININE 1.04 1.16  CALCIUM  9.2 8.7*  GFRNONAA >60 >60  GFRAA >60 >60  ANIONGAP 9 5     Hematology Recent Labs Lab 11/07/16 1134 11/08/16 0402  WBC 4.6 7.0  RBC 5.01 4.89  HGB 14.0 13.4  HCT 41.0 40.4  MCV 81.9 82.7  MCH 28.0 27.4  MCHC 34.2 33.1  RDW 24.2* 23.9*  PLT 194 214    Cardiac Enzymes Recent Labs Lab 11/07/16 1134 11/07/16 1509 11/07/16 1851 11/07/16 2100  TROPONINI <0.03 <0.03 <0.03 <0.03   No results for input(s): TROPIPOC in the last 168 hours.   BNPNo results for input(s): BNP, PROBNP in the last 168 hours.   DDimer No results for input(s): DDIMER in the last 168 hours.   Radiology    Dg Chest 2 View  Result Date: 11/07/2016 CLINICAL DATA:  Chest pain. EXAM: CHEST  2 VIEW COMPARISON:  Chest x-ray dated September 26, 2016. FINDINGS: Postsurgical changes related to prior CABG. The cardiomediastinal silhouette is normal in size. Normal pulmonary vascularity. No focal consolidation, pleural effusion, or pneumothorax. IMPRESSION: No active cardiopulmonary disease.  Electronically Signed   By: Titus Dubin M.D.   On: 11/07/2016 11:53   Ct Angio Chest Pe W And/or Wo Contrast  Result Date: 11/07/2016 CLINICAL DATA:  Shortness of breath and chest pain EXAM: CT ANGIOGRAPHY CHEST WITH CONTRAST TECHNIQUE: Multidetector CT imaging of the chest was performed using the standard protocol during bolus administration of intravenous contrast. Multiplanar CT image reconstructions and MIPs were obtained to evaluate the vascular anatomy. CONTRAST:  75 mL Isovue 370 nonionic COMPARISON:  Chest CT August 08, 2016 and chest radiograph November 07, 2016 FINDINGS: Cardiovascular: Currently no pulmonary embolus is evident. Previous left upper lobe pulmonary embolus has resolved. There is no appreciable thoracic aortic aneurysm or dissection. There is mild atherosclerotic calcification in the proximal right common and left subclavian artery. Visualized great vessels otherwise appear unremarkable. There are  scattered foci of thoracic aortic atherosclerosis. There is calcification in multiple native coronary arteries. There is evidence of previous median sternotomy with coronary artery bypass grafting. Pericardium is not appreciably thickened. Mediastinum/Nodes: Thyroid appears unremarkable. There are scattered subcentimeter mediastinal lymph nodes. There is no adenopathy by size criteria in the thoracic region. No esophageal lesions are appreciable. Lungs/Pleura: There is mild bibasilar atelectasis. There is no lung edema or consolidation. No pleural effusion or pleural thickening evident. Upper Abdomen: Visualized upper abdominal structures appear normal except for scattered foci of atherosclerotic calcification. Musculoskeletal: Previous median sternotomy. Degenerative change noted in thoracic spine. There are no blastic or lytic bone lesions. Review of the MIP images confirms the above findings. IMPRESSION: 1. No pulmonary embolus. Previously noted pulmonary embolus in left upper lobe region has resolved. 2. Foci of aortic and proximal great vessel atherosclerosis. Foci of native coronary artery calcification. Previous coronary artery bypass grafting. 3. No adenopathy by size criteria. Subcentimeter mediastinal lymph nodes are felt to be nonspecific. Aortic Atherosclerosis (ICD10-I70.0). Electronically Signed   By: Lowella Grip III M.D.   On: 11/07/2016 16:48    Cardiac Studies   Echo 07/2016: Study Conclusions  - Left ventricle: The cavity size was normal. Systolic function was   normal. The estimated ejection fraction was in the range of 60%   to 65%. Wall motion was normal; there were no regional wall   motion abnormalities. - Mitral valve: There was mild regurgitation. - Left atrium: The atrium was mildly dilated. - Right ventricle: Systolic function was normal. - Pulmonary arteries: Systolic pressure was within the normal   range.  Impressions:  - Patient reportedly very anxious  during the study.  LHC 08/2015: Procedural Findings:   Coronary angiography:  Coronary dominance: Right  Left mainstem: Moderate size vessel that bifurcates into the LAD and left circumflex, mild diffuse disease  Left anterior descending (LAD): Large vessel that extends to the apical region, diagonal branch 2 of small size, proximal to mid LAD stent, 40 to 50% ISR, LIMA graft patent  Left circumflex (LCx): Small to moderate sized vessel with OM branch 2, occluded in its midsection, graft to a high OM/ramus is patent, ramus branch is small, OM 2 is small, severe ostial disease  Right coronary artery (RCA): Right dominant vessel with PL and PDA, most of the right coronary artery has been stented, no significant in-stent restenosis noted  Left ventriculography: Left ventricular systolic function is low normal LVEF is estimated at 50%, there is no significant mitral regurgitation , no significant aortic valve stenosis. Mild basal inferior hypokinesis  Final Conclusions:  --Patent LIMA graft to the LAD, proximal to mid LAD stent patent --  Patent saphenous vein graft to the high OM/ramus --Small diffusely diseased circumflex not amenable to intervention with collaterals from left to left, right to left --RCA is patent with no significant disease   Recommendations:  Medical management recommended Etiology of his chronic chest pain is unclear Unable to exclude coronary spasm or small vessel disease There is some atypical component to his chest pain, sometimes at rest, sometimes on exertion. He has appointment with pain clinic in [place] next week Previously we have prescribed tramadol which he did not try. Certainly chest pain has not improved despite coronary intervention such as bypass surgery and stenting.  We did again recommend long-acting nitrates, even ranexa -Anxiety component associated with his symptoms -We have previously recommended cardiac rehabilitation  Patient Profile      76 y.o. 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  Assessment & Plan    1. Stable angina: -Longstanding history of chronic chest pain and severe anxiety -Has ruled out -Up sitting in bed reading the newspaper in the step down unit this morning -Echo pending per IM -Would not resume heparin gtt -Consider adding Ranexa -Stop nitro gtt -Recommend continued optimization of medications per ORBITA trial  -Statin -Beta blocker -No plans for further inpatient ischemic evaluation -Needs help with anxiety   2. ICM: -He does not appear volume up at this time -Echo pending -Coreg as above  3. History of PE: -Heparin stopped this morning -If anticoagulation is to be continued (has received 3 months of Xarelto) this will need to be resumed today, management of this per IM  4. PAF: -In sinus rhythm  -Resume Xarelto as above  5. HTN: -BP soft on nitro gtt, stopped  For questions or updates, please contact [place] Please consult [email or web address] for contact info under Cardiology/STEMI.      Signed, Christell Faith, PA-C  11/08/2016, 7:23 AM     Attending Note Patient seen and examined, agree with detailed note above,  Patient presentation and plan discussed on rounds.   Patient known to me from the clinic with long history of chronic chest pain, known coronary artery disease, CABG, redo sternotomy in [facility] presenting with shortness of breath and chest tightness on exertion consistent with stable angina  Reports [place] shortness of  breath on short excursions Came home, decided to do some aerobic activity, went for a walk. Developed some chest pain Pain not relieved with nitroglycerin, presented to the emergency room  Overnight has been chest pain-free, cardiac enzymes negative 4 He is concerned about his symptoms, concerned about underlying ischemia  On physical exam he is comfortable, sitting up reading newspaper, no JVD, lungs clear to auscultation bilaterally heart sounds regular with normal S1-S2 no murmurs appreciated abdomen soft nontender no significant lower extremity edema  Lab work reviewed negative cardiac enzymes 4, normal renal function, BMP, CBC  EKG personally reviewed by myself, no changes from prior EKGs  1) stable angina This is not a non-STEMI Long discussion with him concerning various treatment options for his pain He is very concerned about his recent shortness of breath and chest pain symptoms, oes not feel  it is from his sternum. After discussing risk and benefit of procedures, he has indicated he would like perfusion Myoview. He is uncertain if he would be up to treadmill Myoview scheduled for this afternoon, we'll hold his nitrates and beta blocker in preparation All of his infusions can be held  Plan above discussed with patient, family at the bedside  Greater than 50% was spent in counseling and coordination of care with patient Total encounter time 35 minutes or more   Signed: Esmond Plants  M.D., Ph.D. [facility]

## 2016-11-08 NOTE — Care Management (Signed)
MOON (Medicare OBS) note has been shared with patient's wife and left with her for review. Patient is currently in cardiac procedure. RNCM will follow up to answer any questions.

## 2016-11-08 NOTE — Progress Notes (Signed)
Discharge instructions given to patient, who verbalized understanding.  Vss.  F/U appt made for patient.  PIV's removed.  Patient's wife driving patient home after they pick up samples of new med from Dr Donivan Scull office.

## 2016-11-16 DIAGNOSIS — I480 Paroxysmal atrial fibrillation: Secondary | ICD-10-CM | POA: Diagnosis not present

## 2016-11-16 DIAGNOSIS — Z09 Encounter for follow-up examination after completed treatment for conditions other than malignant neoplasm: Secondary | ICD-10-CM | POA: Diagnosis not present

## 2016-11-16 DIAGNOSIS — D6859 Other primary thrombophilia: Secondary | ICD-10-CM | POA: Diagnosis not present

## 2016-11-16 DIAGNOSIS — I25111 Atherosclerotic heart disease of native coronary artery with angina pectoris with documented spasm: Secondary | ICD-10-CM | POA: Diagnosis not present

## 2016-11-16 DIAGNOSIS — I10 Essential (primary) hypertension: Secondary | ICD-10-CM | POA: Diagnosis not present

## 2016-11-16 DIAGNOSIS — E782 Mixed hyperlipidemia: Secondary | ICD-10-CM | POA: Diagnosis not present

## 2016-11-19 ENCOUNTER — Emergency Department: Payer: Medicare HMO

## 2016-11-19 ENCOUNTER — Encounter: Payer: Self-pay | Admitting: Emergency Medicine

## 2016-11-19 ENCOUNTER — Observation Stay
Admission: EM | Admit: 2016-11-19 | Discharge: 2016-11-21 | Disposition: A | Discharge status: Home / Self Care | Payer: Medicare HMO | Attending: Internal Medicine | Admitting: Internal Medicine

## 2016-11-19 DIAGNOSIS — I255 Ischemic cardiomyopathy: Secondary | ICD-10-CM | POA: Diagnosis not present

## 2016-11-19 DIAGNOSIS — E785 Hyperlipidemia, unspecified: Secondary | ICD-10-CM | POA: Diagnosis not present

## 2016-11-19 DIAGNOSIS — D500 Iron deficiency anemia secondary to blood loss (chronic): Secondary | ICD-10-CM | POA: Diagnosis not present

## 2016-11-19 DIAGNOSIS — I5042 Chronic combined systolic (congestive) and diastolic (congestive) heart failure: Secondary | ICD-10-CM | POA: Insufficient documentation

## 2016-11-19 DIAGNOSIS — R55 Syncope and collapse: Secondary | ICD-10-CM | POA: Diagnosis not present

## 2016-11-19 DIAGNOSIS — R569 Unspecified convulsions: Secondary | ICD-10-CM | POA: Diagnosis not present

## 2016-11-19 DIAGNOSIS — R05 Cough: Secondary | ICD-10-CM | POA: Diagnosis not present

## 2016-11-19 DIAGNOSIS — I25110 Atherosclerotic heart disease of native coronary artery with unstable angina pectoris: Secondary | ICD-10-CM | POA: Diagnosis not present

## 2016-11-19 DIAGNOSIS — R4182 Altered mental status, unspecified: Secondary | ICD-10-CM

## 2016-11-19 DIAGNOSIS — Z87891 Personal history of nicotine dependence: Secondary | ICD-10-CM | POA: Insufficient documentation

## 2016-11-19 DIAGNOSIS — J320 Chronic maxillary sinusitis: Secondary | ICD-10-CM | POA: Insufficient documentation

## 2016-11-19 DIAGNOSIS — R0600 Dyspnea, unspecified: Secondary | ICD-10-CM | POA: Diagnosis not present

## 2016-11-19 DIAGNOSIS — M1611 Unilateral primary osteoarthritis, right hip: Secondary | ICD-10-CM | POA: Diagnosis not present

## 2016-11-19 DIAGNOSIS — G40409 Other generalized epilepsy and epileptic syndromes, not intractable, without status epilepticus: Principal | ICD-10-CM | POA: Insufficient documentation

## 2016-11-19 DIAGNOSIS — I110 Hypertensive heart disease with heart failure: Secondary | ICD-10-CM | POA: Insufficient documentation

## 2016-11-19 DIAGNOSIS — I25709 Atherosclerosis of coronary artery bypass graft(s), unspecified, with unspecified angina pectoris: Secondary | ICD-10-CM | POA: Diagnosis not present

## 2016-11-19 DIAGNOSIS — R0602 Shortness of breath: Secondary | ICD-10-CM | POA: Diagnosis not present

## 2016-11-19 DIAGNOSIS — Z79899 Other long term (current) drug therapy: Secondary | ICD-10-CM | POA: Insufficient documentation

## 2016-11-19 DIAGNOSIS — I10 Essential (primary) hypertension: Secondary | ICD-10-CM | POA: Diagnosis not present

## 2016-11-19 DIAGNOSIS — R079 Chest pain, unspecified: Secondary | ICD-10-CM | POA: Diagnosis not present

## 2016-11-19 DIAGNOSIS — I480 Paroxysmal atrial fibrillation: Secondary | ICD-10-CM | POA: Insufficient documentation

## 2016-11-19 DIAGNOSIS — Z86711 Personal history of pulmonary embolism: Secondary | ICD-10-CM | POA: Insufficient documentation

## 2016-11-19 DIAGNOSIS — G253 Myoclonus: Secondary | ICD-10-CM

## 2016-11-19 DIAGNOSIS — Z955 Presence of coronary angioplasty implant and graft: Secondary | ICD-10-CM | POA: Insufficient documentation

## 2016-11-19 DIAGNOSIS — F410 Panic disorder [episodic paroxysmal anxiety] without agoraphobia: Secondary | ICD-10-CM | POA: Diagnosis present

## 2016-11-19 DIAGNOSIS — Z885 Allergy status to narcotic agent status: Secondary | ICD-10-CM | POA: Insufficient documentation

## 2016-11-19 DIAGNOSIS — Z7901 Long term (current) use of anticoagulants: Secondary | ICD-10-CM | POA: Insufficient documentation

## 2016-11-19 DIAGNOSIS — G43909 Migraine, unspecified, not intractable, without status migrainosus: Secondary | ICD-10-CM | POA: Diagnosis not present

## 2016-11-19 DIAGNOSIS — Z888 Allergy status to other drugs, medicaments and biological substances status: Secondary | ICD-10-CM | POA: Insufficient documentation

## 2016-11-19 DIAGNOSIS — J322 Chronic ethmoidal sinusitis: Secondary | ICD-10-CM | POA: Insufficient documentation

## 2016-11-19 DIAGNOSIS — F05 Delirium due to known physiological condition: Secondary | ICD-10-CM | POA: Insufficient documentation

## 2016-11-19 DIAGNOSIS — R4020 Unspecified coma: Secondary | ICD-10-CM | POA: Diagnosis not present

## 2016-11-19 DIAGNOSIS — Z91041 Radiographic dye allergy status: Secondary | ICD-10-CM | POA: Diagnosis not present

## 2016-11-19 DIAGNOSIS — Z9842 Cataract extraction status, left eye: Secondary | ICD-10-CM | POA: Diagnosis not present

## 2016-11-19 DIAGNOSIS — I252 Old myocardial infarction: Secondary | ICD-10-CM | POA: Diagnosis not present

## 2016-11-19 DIAGNOSIS — G8929 Other chronic pain: Secondary | ICD-10-CM | POA: Insufficient documentation

## 2016-11-19 DIAGNOSIS — I429 Cardiomyopathy, unspecified: Secondary | ICD-10-CM | POA: Diagnosis not present

## 2016-11-19 DIAGNOSIS — R69 Illness, unspecified: Secondary | ICD-10-CM | POA: Diagnosis not present

## 2016-11-19 LAB — URINALYSIS, COMPLETE (UACMP) WITH MICROSCOPIC
Bacteria, UA: NONE SEEN
Bilirubin Urine: NEGATIVE
Glucose, UA: NEGATIVE mg/dL
Hgb urine dipstick: NEGATIVE
KETONES UR: NEGATIVE mg/dL
LEUKOCYTES UA: NEGATIVE
NITRITE: NEGATIVE
PH: 6 (ref 5.0–8.0)
Protein, ur: 30 mg/dL — AB
SPECIFIC GRAVITY, URINE: 1.028 (ref 1.005–1.030)

## 2016-11-19 LAB — CBC WITH DIFFERENTIAL/PLATELET
BASOS PCT: 1 %
Basophils Absolute: 0 10*3/uL (ref 0–0.1)
EOS ABS: 0.1 10*3/uL (ref 0–0.7)
Eosinophils Relative: 1 %
HCT: 46.4 % (ref 40.0–52.0)
HEMOGLOBIN: 15.3 g/dL (ref 13.0–18.0)
Lymphocytes Relative: 30 %
Lymphs Abs: 1.9 10*3/uL (ref 1.0–3.6)
MCH: 27.6 pg (ref 26.0–34.0)
MCHC: 33 g/dL (ref 32.0–36.0)
MCV: 83.6 fL (ref 80.0–100.0)
Monocytes Absolute: 0.5 10*3/uL (ref 0.2–1.0)
Monocytes Relative: 8 %
NEUTROS PCT: 60 %
Neutro Abs: 3.7 10*3/uL (ref 1.4–6.5)
Platelets: 263 10*3/uL (ref 150–440)
RBC: 5.54 MIL/uL (ref 4.40–5.90)
RDW: 23.8 % — ABNORMAL HIGH (ref 11.5–14.5)
WBC: 6.2 10*3/uL (ref 3.8–10.6)

## 2016-11-19 LAB — COMPREHENSIVE METABOLIC PANEL
ALK PHOS: 44 U/L (ref 38–126)
ALT: 20 U/L (ref 17–63)
ANION GAP: 8 (ref 5–15)
AST: 23 U/L (ref 15–41)
Albumin: 3.8 g/dL (ref 3.5–5.0)
BILIRUBIN TOTAL: 0.7 mg/dL (ref 0.3–1.2)
BUN: 23 mg/dL — ABNORMAL HIGH (ref 6–20)
CALCIUM: 9.3 mg/dL (ref 8.9–10.3)
CHLORIDE: 101 mmol/L (ref 101–111)
CO2: 28 mmol/L (ref 22–32)
CREATININE: 1.4 mg/dL — AB (ref 0.61–1.24)
GFR calc non Af Amer: 48 mL/min — ABNORMAL LOW (ref >60)
GFR, EST AFRICAN AMERICAN: 56 mL/min — AB (ref >60)
Glucose, Bld: 111 mg/dL — ABNORMAL HIGH (ref 65–99)
Potassium: 4.5 mmol/L (ref 3.5–5.1)
Sodium: 137 mmol/L (ref 135–145)
Total Protein: 7.7 g/dL (ref 6.5–8.1)

## 2016-11-19 LAB — TROPONIN I: Troponin I: <0.03 ng/mL (ref <0.03)

## 2016-11-19 LAB — PROTIME-INR
INR: 3.45
PROTHROMBIN TIME: 34.5 s — AB (ref 11.4–15.2)

## 2016-11-19 LAB — CK: CK TOTAL: 64 U/L (ref 49–397)

## 2016-11-19 LAB — TSH: TSH: 3.714 u[IU]/mL (ref 0.350–4.500)

## 2016-11-19 MED ORDER — SODIUM CHLORIDE 0.9 % IV SOLN
INTRAVENOUS | Status: DC
  Administered 2016-11-19 – 2016-11-21 (×3): via INTRAVENOUS

## 2016-11-19 MED ORDER — RAMIPRIL 2.5 MG PO CAPS
2.5000 mg | ORAL_CAPSULE | Freq: Every day | ORAL | Status: DC
  Administered 2016-11-20 – 2016-11-21 (×2): 2.5 mg via ORAL
  Filled 2016-11-19 (×2): qty 1

## 2016-11-19 MED ORDER — RIVAROXABAN 20 MG PO TABS
20.0000 mg | ORAL_TABLET | Freq: Every day | ORAL | Status: DC
Start: 2016-11-20 — End: 2016-11-21
  Administered 2016-11-20 – 2016-11-21 (×2): 20 mg via ORAL
  Filled 2016-11-19 (×2): qty 1

## 2016-11-19 MED ORDER — ATORVASTATIN CALCIUM 20 MG PO TABS
80.0000 mg | ORAL_TABLET | Freq: Every day | ORAL | Status: DC
  Administered 2016-11-19 – 2016-11-20 (×2): 80 mg via ORAL
  Filled 2016-11-19 (×2): qty 4

## 2016-11-19 MED ORDER — CLONAZEPAM 0.5 MG PO TABS
0.5000 mg | ORAL_TABLET | Freq: Three times a day (TID) | ORAL | Status: DC | PRN
  Administered 2016-11-19 – 2016-11-20 (×2): 0.5 mg via ORAL
  Filled 2016-11-19 (×2): qty 1

## 2016-11-19 MED ORDER — CARVEDILOL 3.125 MG PO TABS
3.1250 mg | ORAL_TABLET | Freq: Every day | ORAL | Status: DC
  Administered 2016-11-20 – 2016-11-21 (×2): 3.125 mg via ORAL
  Filled 2016-11-19 (×2): qty 1

## 2016-11-19 MED ORDER — DOCUSATE SODIUM 100 MG PO CAPS
100.0000 mg | ORAL_CAPSULE | Freq: Two times a day (BID) | ORAL | Status: DC
  Administered 2016-11-19 – 2016-11-21 (×4): 100 mg via ORAL
  Filled 2016-11-19 (×4): qty 1

## 2016-11-19 MED ORDER — ONDANSETRON HCL 4 MG PO TABS: 4.0000 mg | ORAL_TABLET | Freq: Four times a day (QID) | ORAL | Status: DC | PRN

## 2016-11-19 MED ORDER — ACETAMINOPHEN 650 MG RE SUPP: 650.0000 mg | Freq: Four times a day (QID) | RECTAL | Status: DC | PRN

## 2016-11-19 MED ORDER — VALPROATE SODIUM 500 MG/5ML IV SOLN
15.0000 mg/kg | Freq: Once | INTRAVENOUS | Status: AC
  Administered 2016-11-19: 1137 mg via INTRAVENOUS
  Filled 2016-11-19: qty 11.37

## 2016-11-19 MED ORDER — LORAZEPAM 2 MG/ML IJ SOLN
INTRAMUSCULAR | Status: AC
  Filled 2016-11-19: qty 1

## 2016-11-19 MED ORDER — PANTOPRAZOLE SODIUM 40 MG PO TBEC
40.0000 mg | DELAYED_RELEASE_TABLET | Freq: Every day | ORAL | Status: DC
  Administered 2016-11-19 – 2016-11-21 (×3): 40 mg via ORAL
  Filled 2016-11-19 (×3): qty 1

## 2016-11-19 MED ORDER — NITROGLYCERIN 0.4 MG SL SUBL
0.4000 mg | SUBLINGUAL_TABLET | SUBLINGUAL | Status: DC | PRN
  Administered 2016-11-19 – 2016-11-21 (×2): 0.4 mg via SUBLINGUAL
  Filled 2016-11-19 (×3): qty 1

## 2016-11-19 MED ORDER — LORAZEPAM 2 MG/ML IJ SOLN
1.0000 mg | INTRAMUSCULAR | Status: DC | PRN
  Administered 2016-11-19: 1 mg via INTRAVENOUS
  Filled 2016-11-19 (×2): qty 1

## 2016-11-19 MED ORDER — BISACODYL 10 MG RE SUPP: 10.0000 mg | Freq: Every day | RECTAL | Status: DC | PRN

## 2016-11-19 MED ORDER — ONDANSETRON HCL 4 MG/2ML IJ SOLN: 4.0000 mg | Freq: Four times a day (QID) | INTRAMUSCULAR | Status: DC | PRN

## 2016-11-19 MED ORDER — LORAZEPAM 2 MG/ML IJ SOLN
1.0000 mg | Freq: Once | INTRAMUSCULAR | Status: AC
  Administered 2016-11-19: 1 mg via INTRAVENOUS

## 2016-11-19 MED ORDER — LORAZEPAM 2 MG/ML IJ SOLN
1.0000 mg | Freq: Once | INTRAMUSCULAR | Status: AC
  Administered 2016-11-19: 1 mg via INTRAVENOUS
  Filled 2016-11-19: qty 1

## 2016-11-19 MED ORDER — ACETAMINOPHEN 325 MG PO TABS
650.0000 mg | ORAL_TABLET | Freq: Four times a day (QID) | ORAL | Status: DC | PRN
  Administered 2016-11-20: 650 mg via ORAL
  Filled 2016-11-19: qty 2

## 2016-11-19 NOTE — ED Notes (Signed)
This RN called into room my family. Upon entering room patient is found to be alert but flailing around on stretcher. Patients face and chest is red. No loss of control of bowels or bladder. Patient hyperventilating. After coached breathing patient is noted to have even and non labored respirations. MD notified. See orders.

## 2016-11-19 NOTE — ED Notes (Signed)
Patient transported to CT 

## 2016-11-19 NOTE — ED Notes (Signed)
Dr sparks in with pt.  Pt eating crackers and ale.  Alert.  Speech clear.  Pt waiting on admission.

## 2016-11-19 NOTE — ED Notes (Signed)
Nurse will call back for report.

## 2016-11-19 NOTE — ED Notes (Signed)
Resumed care from [place] rn.  Pt alert.  Speech clear.  Pt denies pain at this time.  nsr on monitor.  Family with pt.  Iv in place.  Skin warm and dry.

## 2016-11-19 NOTE — ED Triage Notes (Signed)
Pt arrived via EMS from church, Pt was at church today when he was told to stand, pt stood and sat back down and was confused. Pt doesn't remember what happened.  Pt has involuntary movements and is unable to hold still.   Wife states patient has been complaining of chest pain but unable to find out what happened.  Pt reports felling short of breath.

## 2016-11-19 NOTE — Progress Notes (Signed)
Pt. Stated he was having mid sternal chest pain that did not radiate, no c/o SOB, nausea or vomiting. VSS stable. Nitro tablet given, pt. Felt better. Will continue to monitor pt.

## 2016-11-19 NOTE — Progress Notes (Signed)
Call to room by family because pt was having difficulty breathing. Upon entering room pt. Was lying to the left side of bed taking deep breathing. When pt. Was addressed he began breathing normally and having conversation with myself and the other nurse in the room. VSS were stable. Pt's daughter asked if he had another blood clot, because he had one in May of this year. Stayed with the pt. Asked him if he had anxiety, he stated no, wife said yes she believed he did. I paged Dr. Jerelyn Charles, new order. Please see [place]. Will continue to monitor pt.

## 2016-11-19 NOTE — ED Notes (Signed)
Report called to steve rn floor nurse

## 2016-11-19 NOTE — H&P (Signed)
History and Physical    Scott [name] AOZ:[id number] DOB: [birth year] DOA: 11/19/2016  Referring physician: Dr. Mable Paris PCP: Madelyn Brunner, MD  Specialists: Dr. Rockey Situ  Chief Complaint: seizures  HPI: Scott [name] is a 76 y.o. male has a past medical history significant for ASCVD, panic disorder, and HTN now with acute onset of syncope associated with diffuse myoclonus and confusion. Has had at least 5 episodes this AM, including one witnessed in ER. Ct head and labs negative. BP low. He is now admitted. No fever. No N/V/D. Denies CP. Does admit to some SOB.  Review of Systems: The patient denies anorexia, fever, weight loss,, vision loss, decreased hearing, hoarseness, chest pain, syncope, peripheral edema, balance deficits, hemoptysis, abdominal pain, melena, hematochezia, severe indigestion/heartburn, hematuria, incontinence, genital sores, muscle weakness, suspicious skin lesions, transient blindness, difficulty walking, depression, unusual weight change, abnormal bleeding, enlarged lymph nodes, angioedema, and breast masses.   Past Medical History:  Diagnosis Date  . Arthritis    right hip, since a fall  . CAD (coronary artery disease)    a. 04/2012 Cath: 3VD->Med Rx;  b. 04/2013 PCI RCA (2 DES); c. 03/2014 PCI: LAD 80 (3.0x23 Xience Alpine DES), 30 ISR in RCA; d. 06/2014 CABG x 2 ([facility]) LIMA->LAD, VG->OM; e. 11/2014 Neg MV;  f. 12/2014 Cath ([facility]): patent grafts->Med Rx; g. 03/2015 MV ([facility]) low risk, EF 40%; h. 05/2015 MV: low risk; i. 08/2015 Cath: patent grafts, patent RCA/LAD stents, small LCX/OM->Med Rx.  . Cardiomyopathy, ischemic    a. 04/2012 Echo: EF 40-45%;  b. 08/2013 Echo: EF 45-50%, mild glob HK, mod lat/post HK, diast dysfxn, mildly dil LA, mild MR, nl RVSP; c. 01/2015 Echo: EF 40-45%, Gr 1 DD, mildly dil LA.  . Carotid arterial disease ([place])    a. 05/2013 Carotid U/S; bilat 40-50% ICA stenosis.  . Chronic Chest Pain   . Chronic systolic CHF (congestive heart failure)  ([place])    a. 01/2015 Echo: EF 40-45%.  . Cough   . Dyspnea    pt. unsure of exertion cause- that creates SOB at times   . Headache 1970's   migraine  . History of blood transfusion 2016   post op  . Hyperlipidemia   . Hypertension   . Iron deficiency anemia due to chronic blood loss 09/11/2016  . Myocardial infarction [facility])    unsure of when  . Pulmonary emboli ([place]) 07/2016   Past Surgical History:  Procedure Laterality Date  . CARDIAC CATHETERIZATION  05/01/2012  . CARDIAC CATHETERIZATION  04/2013   [facility];x3 stent  . CARDIAC CATHETERIZATION  01/11/15    [facility]  . CARDIAC CATHETERIZATION N/A 09/16/2015   Procedure: LEFT HEART CATH AND CORS/GRAFTS ANGIOGRAPHY;  Surgeon: Minna Merritts, MD;  Location: [place] [facility];  Service: Cardiovascular;  Laterality: N/A;  . CARPAL TUNNEL RELEASE     right hand  . CATARACT EXTRACTION     LEFT  . CATARACT EXTRACTION W/PHACO Left 09/16/2014   Procedure: CATARACT EXTRACTION PHACO AND INTRAOCULAR LENS PLACEMENT (IOC);  Surgeon: Leandrew Koyanagi, MD;  Location: [place];  Service: Ophthalmology;  Laterality: Left;  . COLONOSCOPY    . COLONOSCOPY WITH PROPOFOL N/A 01/28/2016   Procedure: COLONOSCOPY WITH PROPOFOL;  Surgeon: Lollie Sails, MD;  Location: [facility] ENDOSCOPY;  Service: Endoscopy;  Laterality: N/A;  . CORONARY ANGIOPLASTY WITH STENT PLACEMENT  04/13/2014  . CORONARY ARTERY BYPASS GRAFT  06-26-14   x3 bypasses  . DE QUERVAIN'S RELEASE Left 08/22/2012  .  ESOPHAGOGASTRODUODENOSCOPY (EGD) WITH PROPOFOL N/A 04/26/2015   Procedure: ESOPHAGOGASTRODUODENOSCOPY (EGD) WITH PROPOFOL;  Surgeon: Hulen Luster, MD;  Location: [facility] ENDOSCOPY;  Service: Gastroenterology;  Laterality: N/A;  . RIB PLATING N/A 05/25/2016   Procedure: STERNAL PLATING;  Surgeon: Melrose Nakayama, MD;  Location: [place];  Service: Thoracic;  Laterality: N/A;  . right shoulder    . STERNAL WIRES REMOVAL N/A 03/30/2016   Procedure: STERNAL WIRES REMOVAL;  Surgeon: Melrose Nakayama, MD;  Location: [place];  Service: Thoracic;  Laterality: N/A;  . TONSILLECTOMY     Social History:  reports that he has quit smoking. His smoking use included Cigarettes. He started smoking about 7 years ago. He has a 35.00 pack-year smoking history. He has never used smokeless tobacco. He reports that he does not drink alcohol or use drugs.  Allergies  Allergen Reactions  . Contrast Media [Iodinated Diagnostic Agents] Hives  . Oxycodone Hcl Itching  . Tramadol Itching    Ok when taking benadryl with it  . Vicodin [Hydrocodone-Acetaminophen] Itching    Family History  Problem Relation Age of Onset  . Heart attack Father 33       MI  . Cancer Maternal Uncle     Prior to Admission medications   Medication Sig Start Date End Date Taking? Authorizing Provider  acetaminophen (TYLENOL) 500 MG tablet Take 1,000 mg by mouth every 6 (six) hours as needed for moderate pain.    [provider]  atorvastatin (LIPITOR) 80 MG tablet Take 80 mg by mouth at bedtime.    [provider]  carvedilol (COREG) 3.125 MG tablet Take 1 tablet (3.125 mg total) by mouth daily before breakfast. 05/15/16   Gollan, [name], MD  diphenhydramine-acetaminophen (TYLENOL PM) 25-500 MG TABS tablet Take 2 tablets by mouth at bedtime as needed. For pain/sleep    [provider]  isosorbide mononitrate (IMDUR) 60 MG 24 hr tablet Take 60 mg by mouth daily before breakfast.     [provider]  nitroGLYCERIN (NITROSTAT) 0.4 MG SL tablet Place 0.4 mg under the tongue every 5 (five) minutes as needed for chest pain.    [provider]  ramipril (ALTACE) 2.5 MG capsule Take 2.5 mg by mouth daily.    [provider]  ranolazine (RANEXA) 1000 MG SR tablet Take 1 tablet (1,000 mg total) by mouth 2 (two) times daily. 11/08/16   Minna Merritts, MD  traMADol (ULTRAM) 50 MG tablet Take 1-2 tablets (50-100 mg total) by mouth every 6 (six) hours as needed for moderate  pain. Patient taking differently: Take 50 mg by mouth every 6 (six) hours as needed for moderate pain.  06/30/16   Melrose Nakayama, MD  XARELTO 20 MG TABS tablet Take 1 tablet (20 mg total) by mouth daily. 10/23/16   Minna Merritts, MD  zolpidem (AMBIEN) 5 MG tablet Take 5 mg by mouth at bedtime as needed for sleep.     [provider]   Physical Exam: Vitals:   11/19/16 1336 11/19/16 1400 11/19/16 1430 11/19/16 1500  BP:  (!) 143/86 129/81 91/66  Pulse:  65 63 68  Resp:  (!) 21 16 17   Temp:      TempSrc:      SpO2:  99% 99% 95%  Weight: 75.8 kg (167 lb)     Height: 5\' 10"  (1.778 m)        General:  No apparent distress, WDWN, [place]/AT  Eyes: PERRL, EOMI, no  scleral icterus, conjunctiva clear  ENT: moist oropharynx without exudate, TM's benign, dentition fair  Neck: supple, no lymphadenopathy. No bruits or thyromegaly  Cardiovascular: regular rate without MRG; 2+ peripheral pulses, no JVD, no peripheral edema  Respiratory: CTA biL, good air movement without wheezing, rhonchi or crackled. Respiratory effort normal  Abdomen: soft, non tender to palpation, positive bowel sounds, no guarding, no rebound  Skin: no rashes or lesions  Musculoskeletal: normal bulk and tone, no joint swelling  Psychiatric: normal mood and affect, A&OX3  Neurologic: CN 2-12 grossly intact, Motor strength 5/5 in all 4 groups with symmetric DTR's and non-focal sensory exam  Labs on Admission:  Basic Metabolic Panel:  Recent Labs Lab 11/19/16 1337  NA 137  K 4.5  CL 101  CO2 28  GLUCOSE 111*  BUN 23*  CREATININE 1.40*  CALCIUM 9.3   Liver Function Tests:  Recent Labs Lab 11/19/16 1337  AST 23  ALT 20  ALKPHOS 44  BILITOT 0.7  PROT 7.7  ALBUMIN 3.8   No results for input(s): LIPASE, AMYLASE in the last 168 hours. No results for input(s): AMMONIA in the last 168 hours. CBC:  Recent Labs Lab 11/19/16 1337  WBC 6.2  NEUTROABS 3.7  HGB 15.3  HCT 46.4  MCV 83.6   PLT 263   Cardiac Enzymes:  Recent Labs Lab 11/19/16 1337  CKTOTAL 58  TROPONINI <0.03    BNP (last 3 results)  Recent Labs  01/07/16 1249  BNP 28.0    ProBNP (last 3 results) No results for input(s): PROBNP in the last 8760 hours.  CBG: No results for input(s): GLUCAP in the last 168 hours.  Radiological Exams on Admission: Ct Head Wo Contrast  Result Date: 11/19/2016 CLINICAL DATA:  Altered level of consciousness. EXAM: CT HEAD WITHOUT CONTRAST TECHNIQUE: Contiguous axial images were obtained from the base of the skull through the vertex without intravenous contrast. COMPARISON:  08/07/2016 FINDINGS: Brain: No evidence of acute infarction, hemorrhage, hydrocephalus, extra-axial collection or mass lesion/mass effect. Age congruent appearance of the brain. Vascular: No hyperdense vessel or unexpected calcification. Skull: Normal. Negative for fracture or focal lesion. Sinuses/Orbits: No acute finding. IMPRESSION: Unremarkable head CT. Electronically Signed   By: Monte Fantasia M.D.   On: 11/19/2016 14:13   Dg Chest Port 1 View  Result Date: 11/19/2016 CLINICAL DATA:  Pt [place] EMS from church, Pt was at church today when he was told to stand, pt stood and sat back down and was confused. Pt doesn't remember what happened. Pt has involuntary movements and is unable to hold still. Short of breath. EXAM: PORTABLE CHEST 1 VIEW COMPARISON:  11/07/2016 FINDINGS: Status post sternotomy with sternal plating. Status post CABG. The heart size is normal. There are no focal consolidations or pleural effusions. No pulmonary edema. IMPRESSION: No evidence for acute cardiopulmonary abnormality. Electronically Signed   By: Nolon Nations M.D.   On: 11/19/2016 14:25    EKG: Independently reviewed.  Assessment/Plan Principal Problem:   Myoclonus epilepsy (HCC) Active Problems:   Ischemic cardiomyopathy   Panic disorder   Syncope   Will observe on telemetry with IV fluids and  load with Depakote per Neurology. Ativan IV as needed. Consult Neuro in AM. Hold BP meds for now. Repeat labs in AM  Diet: clear lquids Fluids: NS@100  DVT Prophylaxis: Lovenox  Code Status: FULL  Family Communication: yes  Disposition Plan: home  Time spent: 50 min

## 2016-11-19 NOTE — ED Provider Notes (Signed)
[facility] Emergency Department Provider Note  ____________________________________________   First MD Initiated Contact with Patient 11/19/16 1333     (approximate)  I have reviewed the triage vital signs and the nursing notes.   HISTORY  Chief Complaint Altered Mental Status   HPI Scott [name] is a 76 y.o. male who comes to the emergency department with tremors and chest pain that began roughly an hour prior to arrival.  The patient was standing at church when he felt his chronic angina and took a tablet of nitroglycerin.  Shortly thereafter he sat back down and began to have intermittent spasms where he would sit up in his legs and arms with spasm forward.  He was never confused.  He says he feels somewhat short of breath and has mild to moderate aching in his upper chest although this is chronic for him as he has significant coronary artery disease.  He has never had a seizure before.  He said no fevers or chills.  His symptoms began suddenly and have been intermittent ever since.  Nothing in particular seems to make it better or worse.  Past Medical History:  Diagnosis Date  . Arthritis    right hip, since a fall  . CAD (coronary artery disease)    a. 04/2012 Cath: 3VD->Med Rx;  b. 04/2013 PCI RCA (2 DES); c. 03/2014 PCI: LAD 80 (3.0x23 Xience Alpine DES), 30 ISR in RCA; d. 06/2014 CABG x 2 ([facility]) LIMA->LAD, VG->OM; e. 11/2014 Neg MV;  f. 12/2014 Cath ([facility]): patent grafts->Med Rx; g. 03/2015 MV ([facility]) low risk, EF 40%; h. 05/2015 MV: low risk; i. 08/2015 Cath: patent grafts, patent RCA/LAD stents, small LCX/OM->Med Rx.  . Cardiomyopathy, ischemic    a. 04/2012 Echo: EF 40-45%;  b. 08/2013 Echo: EF 45-50%, mild glob HK, mod lat/post HK, diast dysfxn, mildly dil LA, mild MR, nl RVSP; c. 01/2015 Echo: EF 40-45%, Gr 1 DD, mildly dil LA.  . Carotid arterial disease ([place])    a. 05/2013 Carotid U/S; bilat 40-50% ICA stenosis.  . Chronic Chest Pain   . Chronic systolic  CHF (congestive heart failure) ([place])    a. 01/2015 Echo: EF 40-45%.  . Cough   . Dyspnea    pt. unsure of exertion cause- that creates SOB at times   . Headache 1970's   migraine  . History of blood transfusion 2016   post op  . Hyperlipidemia   . Hypertension   . Iron deficiency anemia due to chronic blood loss 09/11/2016  . Myocardial infarction [facility])    unsure of when  . Pulmonary emboli ([place]) 07/2016    Patient Active Problem List   Diagnosis Date Noted  . Syncope 11/19/2016  . 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 Iron deficiency anemia due to chronic blood loss 09/11/2016  . Pulmonary embolus ([place]) 08/31/2016  . Panic disorder   . 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 Panic attack 08/04/2016  . 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 Coronary artery disease involving native coronary artery of native heart with angina pectoris with documented spasm ([place])   . Hx of CABG   . Ischemic cardiomyopathy   . Chronic systolic CHF (congestive heart failure) ([place])   . Chronic Chest Pain   . Coronary artery disease involving coronary bypass graft of native heart with angina pectoris with documented spasm ([place])   . Angina pectoris ([place])   . Anxiety   . Hyperlipemia   . Chronic diastolic  heart failure ([place]) 12/21/2014  . Unstable angina ([place]) 12/08/2014  . PAF (paroxysmal atrial fibrillation) ([place]) 12/08/2014  . Chronic anticoagulation 12/08/2014  . Anemia 04/20/2014  . 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 Cardiomyopathy, ischemic   . HTN (hypertension)   . Carotid arterial disease ([place])   . S/P drug eluting coronary stent placement 05/30/2013  . Chest pain with low risk for cardiac etiology 05/05/2013  . SOB (shortness of breath) 05/05/2013  . Coronary artery disease involving coronary bypass graft of native heart with angina pectoris ([place]) 05/05/2013  . Hyperlipidemia 05/05/2013  . History of smoking 05/05/2013  . Cardiac angina ([place]) 05/05/2013    Past  Surgical History:  Procedure Laterality Date  . CARDIAC CATHETERIZATION  05/01/2012  . CARDIAC CATHETERIZATION  04/2013   [facility];x3 stent  . CARDIAC CATHETERIZATION  01/11/15    [facility]  . CARDIAC CATHETERIZATION N/A 09/16/2015   Procedure: LEFT HEART CATH AND CORS/GRAFTS ANGIOGRAPHY;  Surgeon: Minna Merritts, MD;  Location: [place] [facility];  Service: Cardiovascular;  Laterality: N/A;  . CARPAL TUNNEL RELEASE     right hand  . CATARACT EXTRACTION     LEFT  . CATARACT EXTRACTION W/PHACO Left 09/16/2014   Procedure: CATARACT EXTRACTION PHACO AND INTRAOCULAR LENS PLACEMENT (IOC);  Surgeon: Leandrew Koyanagi, MD;  Location: [place];  Service: Ophthalmology;  Laterality: Left;  . COLONOSCOPY    . COLONOSCOPY WITH PROPOFOL N/A 01/28/2016   Procedure: COLONOSCOPY WITH PROPOFOL;  Surgeon: Lollie Sails, MD;  Location: [facility] ENDOSCOPY;  Service: Endoscopy;  Laterality: N/A;  . CORONARY ANGIOPLASTY WITH STENT PLACEMENT  04/13/2014  . CORONARY ARTERY BYPASS GRAFT  06-26-14   x3 bypasses  . DE QUERVAIN'S RELEASE Left 08/22/2012  . ESOPHAGOGASTRODUODENOSCOPY (EGD) WITH PROPOFOL N/A 04/26/2015   Procedure: ESOPHAGOGASTRODUODENOSCOPY (EGD) WITH PROPOFOL;  Surgeon: Hulen Luster, MD;  Location: [facility] ENDOSCOPY;  Service: Gastroenterology;  Laterality: N/A;  . RIB PLATING N/A 05/25/2016   Procedure: STERNAL PLATING;  Surgeon: Melrose Nakayama, MD;  Location: [place];  Service: Thoracic;  Laterality: N/A;  . right shoulder    . STERNAL WIRES REMOVAL N/A 03/30/2016   Procedure: STERNAL WIRES REMOVAL;  Surgeon: Melrose Nakayama, MD;  Location: [place];  Service: Thoracic;  Laterality: N/A;  . TONSILLECTOMY      Prior to Admission medications   Medication Sig Start Date End Date Taking? Authorizing Provider  acetaminophen (TYLENOL) 500 MG tablet Take 1,000 mg by mouth every 6 (six) hours as needed for moderate pain.    [provider]  atorvastatin (LIPITOR) 80 MG tablet Take 80 mg by mouth  at bedtime.    [provider]  carvedilol (COREG) 3.125 MG tablet Take 1 tablet (3.125 mg total) by mouth daily before breakfast. 05/15/16   Gollan, [name], MD  diphenhydramine-acetaminophen (TYLENOL PM) 25-500 MG TABS tablet Take 2 tablets by mouth at bedtime as needed. For pain/sleep    [provider]  isosorbide mononitrate (IMDUR) 60 MG 24 hr tablet Take 60 mg by mouth daily before breakfast.     [provider]  nitroGLYCERIN (NITROSTAT) 0.4 MG SL tablet Place 0.4 mg under the tongue every 5 (five) minutes as needed for chest pain.    [provider]  ramipril (ALTACE) 2.5 MG capsule Take 2.5 mg by mouth daily.    [provider]  ranolazine (RANEXA) 1000 MG SR tablet Take 1 tablet (1,000 mg total) by mouth 2 (two) times daily. 11/08/16   [name]  J, MD  traMADol (ULTRAM) 50 MG tablet Take 1-2 tablets (50-100 mg total) by mouth every 6 (six) hours as needed for moderate pain. Patient taking differently: Take 50 mg by mouth every 6 (six) hours as needed for moderate pain.  06/30/16   Melrose Nakayama, MD  XARELTO 20 MG TABS tablet Take 1 tablet (20 mg total) by mouth daily. 10/23/16   Minna Merritts, MD  zolpidem (AMBIEN) 5 MG tablet Take 5 mg by mouth at bedtime as needed for sleep.     [provider]    Allergies Contrast media [iodinated diagnostic agents]; Oxycodone hcl; Tramadol; and Vicodin [hydrocodone-acetaminophen]  Family History  Problem Relation Age of Onset  . Heart attack Father 57       MI  . Cancer Maternal Uncle     Social History Social History  Substance Use Topics  . Smoking status: Former Smoker    Packs/day: 1.00    Years: 35.00    Types: Cigarettes    Start date: 05/02/2009  . Smokeless tobacco: Never Used     Comment: quit in 2006  . Alcohol use No    Review of Systems \\Constitutional : No fever/chills Eyes: No visual changes. ENT: No sore throat. Cardiovascular: Positive for chest  pain. Respiratory: Positive for shortness of breath. Gastrointestinal: No abdominal pain.  No nausea, no vomiting.  No diarrhea.  No constipation. Genitourinary: Negative for dysuria. Musculoskeletal: Negative for back pain. Skin: Negative for rash. Neurological: Positive for myoclonus   ____________________________________________   PHYSICAL EXAM:  VITAL SIGNS: ED Triage Vitals  Enc Vitals Group     BP      Pulse      Resp      Temp      Temp src      SpO2      Weight      Height      Head Circumference      Peak Flow      Pain Score      Pain Loc      Pain Edu?      Excl. in [place]?     Constitutional: Alert and oriented x4 elevated respiratory rate intermittently with full body myoclonus Eyes: PERRL EOMI. Head: Atraumatic. Nose: No congestion/rhinnorhea. Mouth/Throat: No trismus Neck: No stridor.   Cardiovascular: Normal rate, regular rhythm. Grossly normal heart sounds.  Good peripheral circulation. Respiratory: Increased respiratory effort.  No retractions. Lungs CTAB and moving good air Gastrointestinal: Soft nontender Musculoskeletal: No lower extremity edema   Neurologic: No pronator drift 5 out of 5 grips biceps triceps hip flexion hip extension plantar flexion dorsiflexion 2+ DTR sensation intact light touch throughout Skin:  Skin is warm, dry and intact. No rash noted. Psychiatric: Mood and affect are normal. Speech and behavior are normal.    ____________________________________________   DIFFERENTIAL includes but not limited to  Hypocalcemia, intracerebral hemorrhage, stroke, acute coronary syndrome ____________________________________________   LABS (all labs ordered are listed, but only abnormal results are displayed)  Labs Reviewed  COMPREHENSIVE METABOLIC PANEL - Abnormal; Notable for the following:       Result Value   Glucose, Bld 111 (*)    BUN 23 (*)    Creatinine, Ser 1.40 (*)    GFR calc non Af Amer 48 (*)    GFR calc Af Amer 56 (*)     All other components within normal limits  CBC WITH DIFFERENTIAL/PLATELET - Abnormal; Notable for the following:    RDW 23.8 (*)  All other components within normal limits  PROTIME-INR - Abnormal; Notable for the following:    Prothrombin Time 34.5 (*)    All other components within normal limits  TROPONIN I  CK  TSH  URINALYSIS, COMPLETE (UACMP) WITH MICROSCOPIC    Blood work reviewed and interpreted by me shows slight dehydration __________________________________________  EKG  ED ECG REPORT I, Darel Hong, the attending physician, personally viewed and interpreted this ECG.  Date: 11/19/2016 EKG Time:  Rate: 67 Rhythm: normal sinus rhythm QRS Axis: normal Intervals: normal ST/T Wave abnormalities: normal Narrative Interpretation: no evidence of acute ischemia although very wavy baseline makes interpretation difficult.  Poor R wave progression  ____________________________________________  RADIOLOGY  Head CT reviewed by me shows chronic disease but nothing acute ____________________________________________   PROCEDURES  Procedure(s) performed: no  Procedures  Critical Care performed: no  Observation: no ____________________________________________   INITIAL IMPRESSION / ASSESSMENT AND PLAN / ED COURSE  Pertinent labs & imaging results that were available during my care of the patient were reviewed by me and considered in my medical decision making (see chart for details).  The patient arrives with intermittent brief episodes of full body myoclonus however in between the episodes he is neurologically intact.  Unclear etiology of his symptoms and as he is anticoagulated with no neuro findings he requires a head CT as well as broad labs.     ----------------------------------------- 3:08 PM on 11/19/2016 -----------------------------------------  I discussed the case with on-call neurologist Dr. Irish Elders who recommends loading the patient with  15 mg/kg of intravenous Depakote and admitting him to the hospital for workup of his myoclonus.  I discussed the case with on-call hospitalist Dr. Doy Hutching who is graciously agreed to admit the patient to his service.  I also discussed the case with the patient and his family who agree with admission. ____________________________________________   FINAL CLINICAL IMPRESSION(S) / ED DIAGNOSES  Final diagnoses:  Myoclonus  Altered mental status, unspecified altered mental status type      NEW MEDICATIONS STARTED DURING THIS VISIT:  New Prescriptions   No medications on file     Note:  This document was prepared using Dragon voice recognition software and may include unintentional dictation errors.     Darel Hong, MD 11/19/16 [phone]

## 2016-11-20 ENCOUNTER — Observation Stay (HOSPITAL_COMMUNITY): Payer: Medicare HMO

## 2016-11-20 ENCOUNTER — Observation Stay: Payer: Medicare HMO

## 2016-11-20 DIAGNOSIS — R69 Illness, unspecified: Secondary | ICD-10-CM | POA: Diagnosis not present

## 2016-11-20 DIAGNOSIS — R4182 Altered mental status, unspecified: Secondary | ICD-10-CM | POA: Diagnosis not present

## 2016-11-20 DIAGNOSIS — R259 Unspecified abnormal involuntary movements: Secondary | ICD-10-CM

## 2016-11-20 DIAGNOSIS — I429 Cardiomyopathy, unspecified: Secondary | ICD-10-CM | POA: Diagnosis not present

## 2016-11-20 DIAGNOSIS — G40409 Other generalized epilepsy and epileptic syndromes, not intractable, without status epilepticus: Secondary | ICD-10-CM | POA: Diagnosis not present

## 2016-11-20 DIAGNOSIS — R55 Syncope and collapse: Secondary | ICD-10-CM | POA: Diagnosis not present

## 2016-11-20 DIAGNOSIS — J0120 Acute ethmoidal sinusitis, unspecified: Secondary | ICD-10-CM | POA: Diagnosis not present

## 2016-11-20 LAB — CBC
HCT: 41.6 % (ref 40.0–52.0)
HEMOGLOBIN: 14.1 g/dL (ref 13.0–18.0)
MCH: 28.2 pg (ref 26.0–34.0)
MCHC: 33.9 g/dL (ref 32.0–36.0)
MCV: 83.2 fL (ref 80.0–100.0)
PLATELETS: 208 10*3/uL (ref 150–440)
RBC: 5 MIL/uL (ref 4.40–5.90)
RDW: 24.1 % — ABNORMAL HIGH (ref 11.5–14.5)
WBC: 4.8 10*3/uL (ref 3.8–10.6)

## 2016-11-20 LAB — COMPREHENSIVE METABOLIC PANEL
ALT: 17 U/L (ref 17–63)
AST: 22 U/L (ref 15–41)
Albumin: 3.2 g/dL — ABNORMAL LOW (ref 3.5–5.0)
Alkaline Phosphatase: 40 U/L (ref 38–126)
Anion gap: 3 — ABNORMAL LOW (ref 5–15)
BUN: 16 mg/dL (ref 6–20)
CHLORIDE: 108 mmol/L (ref 101–111)
CO2: 28 mmol/L (ref 22–32)
CREATININE: 1.21 mg/dL (ref 0.61–1.24)
Calcium: 8.5 mg/dL — ABNORMAL LOW (ref 8.9–10.3)
GFR, EST NON AFRICAN AMERICAN: 57 mL/min — AB (ref >60)
Glucose, Bld: 92 mg/dL (ref 65–99)
Potassium: 4.1 mmol/L (ref 3.5–5.1)
Sodium: 139 mmol/L (ref 135–145)
Total Bilirubin: 0.6 mg/dL (ref 0.3–1.2)
Total Protein: 6.2 g/dL — ABNORMAL LOW (ref 6.5–8.1)

## 2016-11-20 MED ORDER — ADULT MULTIVITAMIN W/MINERALS CH
1.0000 | ORAL_TABLET | Freq: Every day | ORAL | Status: DC
  Administered 2016-11-20 – 2016-11-21 (×2): 1 via ORAL
  Filled 2016-11-20: qty 1

## 2016-11-20 MED ORDER — ENSURE ENLIVE PO LIQD: 237.0000 mL | Freq: Two times a day (BID) | ORAL | Status: DC

## 2016-11-20 MED ORDER — RANOLAZINE ER 500 MG PO TB12
500.0000 mg | ORAL_TABLET | Freq: Two times a day (BID) | ORAL | Status: DC
  Administered 2016-11-20 – 2016-11-21 (×2): 500 mg via ORAL
  Filled 2016-11-20 (×3): qty 1

## 2016-11-20 MED ORDER — GADOBENATE DIMEGLUMINE 529 MG/ML IV SOLN
15.0000 mL | Freq: Once | INTRAVENOUS | Status: AC | PRN
  Administered 2016-11-20: 15 mL via INTRAVENOUS

## 2016-11-20 NOTE — Care Management Obs Status (Signed)
[place] NOTIFICATION   Patient Details  Name: Scott [name] MRN: [id number] Date of Birth: 25-Jun-1940   Medicare Observation Status Notification Given:  Yes Notice signed, one given to patient and the other to HIM for scanning    Katrina Stack, RN 11/20/2016, 4:02 PM

## 2016-11-20 NOTE — Progress Notes (Signed)
[place] [facility] [place] NAME: Scott [name]    MR#:  [id number]  DATE OF BIRTH:  April 01, 1940  SUBJECTIVE:  CHIEF COMPLAINT:   Chief Complaint  Patient presents with  . Altered Mental Status    came with Seizure like episode.   No complains, feels fine.  REVIEW OF SYSTEMS:  CONSTITUTIONAL: No fever, fatigue or weakness.  EYES: No blurred or double vision.  EARS, NOSE, AND THROAT: No tinnitus or ear pain.  RESPIRATORY: No cough, shortness of breath, wheezing or hemoptysis.  CARDIOVASCULAR: No chest pain, orthopnea, edema.  GASTROINTESTINAL: No nausea, vomiting, diarrhea or abdominal pain.  GENITOURINARY: No dysuria, hematuria.  ENDOCRINE: No polyuria, nocturia,  HEMATOLOGY: No anemia, easy bruising or bleeding SKIN: No rash or lesion. MUSCULOSKELETAL: No joint pain or arthritis.   NEUROLOGIC: No tingling, numbness, weakness.  PSYCHIATRY: No anxiety or depression.   ROS  DRUG ALLERGIES:   Allergies  Allergen Reactions  . Contrast Media [Iodinated Diagnostic Agents] Hives  . Oxycodone Hcl Itching  . Vicodin [Hydrocodone-Acetaminophen] Itching    VITALS:  Blood pressure 126/75, pulse 75, temperature 97.7 F (36.5 C), temperature source Oral, resp. rate 18, height 5\' 10"  (1.778 m), weight 72.6 kg (160 lb), SpO2 100 %.  PHYSICAL EXAMINATION:  GENERAL:  76 y.o.-year-old patient lying in the bed with no acute distress.  EYES: Pupils equal, round, reactive to light and accommodation. No scleral icterus. Extraocular muscles intact.  HEENT: Head atraumatic, normocephalic. Oropharynx and nasopharynx clear.  NECK:  Supple, no jugular venous distention. No thyroid enlargement, no tenderness.  LUNGS: Normal breath sounds bilaterally, no wheezing, rales,rhonchi or crepitation. No use of accessory muscles of respiration.  CARDIOVASCULAR: S1, S2 normal. No murmurs, rubs, or gallops.  ABDOMEN: Soft, nontender, nondistended. Bowel sounds present. No  organomegaly or mass.  EXTREMITIES: No pedal edema, cyanosis, or clubbing.  NEUROLOGIC: Cranial nerves II through XII are intact. Muscle strength 5/5 in all extremities. Sensation intact. Gait not checked.  PSYCHIATRIC: The patient is alert and oriented x 3.  SKIN: No obvious rash, lesion, or ulcer.   Physical Exam LABORATORY PANEL:   CBC  Recent Labs Lab 11/20/16 0541  WBC 4.8  HGB 14.1  HCT 41.6  PLT 208   ------------------------------------------------------------------------------------------------------------------  Chemistries   Recent Labs Lab 11/20/16 0541  NA 139  K 4.1  CL 108  CO2 28  GLUCOSE 92  BUN 16  CREATININE 1.21  CALCIUM 8.5*  AST 22  ALT 17  ALKPHOS 40  BILITOT 0.6   ------------------------------------------------------------------------------------------------------------------  Cardiac Enzymes  Recent Labs Lab 11/19/16 1337  TROPONINI <0.03   ------------------------------------------------------------------------------------------------------------------  RADIOLOGY:  Ct Head Wo Contrast  Result Date: 11/19/2016 CLINICAL DATA:  Altered level of consciousness. EXAM: CT HEAD WITHOUT CONTRAST TECHNIQUE: Contiguous axial images were obtained from the base of the skull through the vertex without intravenous contrast. COMPARISON:  08/07/2016 FINDINGS: Brain: No evidence of acute infarction, hemorrhage, hydrocephalus, extra-axial collection or mass lesion/mass effect. Age congruent appearance of the brain. Vascular: No hyperdense vessel or unexpected calcification. Skull: Normal. Negative for fracture or focal lesion. Sinuses/Orbits: No acute finding. IMPRESSION: Unremarkable head CT. Electronically Signed   By: Monte Fantasia M.D.   On: 11/19/2016 14:13   Mr Jeri Cos ZW Contrast  Result Date: 11/20/2016 CLINICAL DATA:  Abnormal involuntary movements. Acute change yesterday at church. EXAM: MRI HEAD WITHOUT AND WITH CONTRAST TECHNIQUE:  Multiplanar, multiecho pulse sequences of the brain and surrounding structures were obtained without and  with intravenous contrast. CONTRAST:  64mL MULTIHANCE GADOBENATE DIMEGLUMINE 529 MG/ML IV SOLN COMPARISON:  CT head without contrast 11/19/2016. MRI brain 08/07/2016 FINDINGS: Brain: Scattered punctate foci of subcortical T2 hyperintensity bilaterally are stable. No acute infarct, hemorrhage, or mass lesion is present. The ventricles are of normal size. No significant extraaxial fluid collection is present. There is no significant progression of white matter disease. Internal auditory canals are within normal limits bilaterally. The brainstem and cerebellum are normal. The postcontrast images demonstrate no pathologic enhancement. Dedicated imaging of the temporal lobes demonstrate symmetric size and signal of the hippocampal structures. No discrete lesion or enhancement is present. Vascular: Flow is present in the major intracranial arteries. Skull and upper cervical spine: The skullbase is within normal limits. The craniocervical junction is normal. Midline sagittal structures are unremarkable. Sinuses/Orbits: Fluid levels are present in the maxillary sinuses bilaterally. There is mild mucosal thickening within the anterior ethmoid air cells as well. There is fluid the nose. Frontal sinuses and sphenoid sinuses are clear. Mastoid air cells are clear. Bilateral lens replacements are present. The globes and orbits are within normal limits. IMPRESSION: 1. Stable appearance of scattered subcortical T2 hyperintensities bilaterally, mildly advanced for age. 2. No acute intracranial abnormality. 3. No acute or focal lesion to explain seizures or mental status changes. 4. Bilateral maxillary and ethmoid sinusitis. Electronically Signed   By: San Morelle M.D.   On: 11/20/2016 16:14   Dg Chest Port 1 View  Result Date: 11/19/2016 CLINICAL DATA:  Pt arrived via EMS from church, Pt was at church today when he  was told to stand, pt stood and sat back down and was confused. Pt doesn't remember what happened. Pt has involuntary movements and is unable to hold still. Short of breath. EXAM: PORTABLE CHEST 1 VIEW COMPARISON:  11/07/2016 FINDINGS: Status post sternotomy with sternal plating. Status post CABG. The heart size is normal. There are no focal consolidations or pleural effusions. No pulmonary edema. IMPRESSION: No evidence for acute cardiopulmonary abnormality. Electronically Signed   By: Nolon Nations M.D.   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    Ordered EEG, MRI, Orthostatic    Suggest to discuss with cardiologist , if Ranexa can be d/ced , I spoke to Cardiologist- as per them, there is not any side effects like what pt mentioned. And pt have angina, so suggest to cont same dose.  * CAD with hx of recurrent angina   COnt statin, xarelto, Ramipril, Coreg. ranexa.  * Hypertension   Coreg, ramipril.  * Hyperlipidmia   Atorvastatin. All the records are reviewed and case discussed with Care Management/Social Workerr. Management plans discussed with the patient, family and they are in agreement.  CODE STATUS: Full.  TOTAL TIME TAKING CARE OF THIS PATIENT: 35 minutes.     POSSIBLE D/C IN 1-2 DAYS, DEPENDING ON CLINICAL CONDITION.   Vaughan Basta M.D on 11/20/2016   Between 7am to 6pm - Pager - [phone]  After 6pm go to [email or web address] - password [email or web address] [facility]  Office  [phone]  CC: Primary care physician; Madelyn Brunner, MD  Note: This dictation was prepared with Dragon dictation along with smaller phrase technology. Any transcriptional errors that result from this process are unintentional.

## 2016-11-20 NOTE — Progress Notes (Signed)
Initial Nutrition Assessment  DOCUMENTATION CODES:   Not applicable  INTERVENTION:   Ensure Enlive po BID, each supplement provides 350 kcal and 20 grams of protein  MVI  NUTRITION DIAGNOSIS:   Inadequate oral intake related to acute illness, seizure as evidenced by per patient/family report  GOAL:   Patient will meet greater than or equal to 90% of their needs  MONITOR:   PO intake, Supplement acceptance, Labs, Weight trends  REASON FOR ASSESSMENT:   Malnutrition Screening Tool    ASSESSMENT:   76 y.o. male has a past medical history significant for ASCVD, panic disorder, and HTN now with acute onset of syncope associated with diffuse myoclonus and confusion.    Met with pt and family member in room today. Pt sleeping at time of RD visit so history obtained from family member at bedside. Family member reports pt with good appetite and oral intake pta. Pt is upset that he has been on a liquid diet; advanced to regular diet today. Family member denies any recent loss but per chart, pt appears to have lost 12lbs(7%) in 2 months and 6lbs(4%) in two weeks; this is significant. Pt does not drink supplements at home; RD will add Ensure to help pt meet estimated needs. Pt noted to have low iron in August; unsure if this was ever supplemented. No iron supplements noted on home medication list. RD will add MVI.  Medications reviewed and include: colace, protonix, NaCl _0 /hr  Labs reviewed: Ca 8.5(L) adj. 9.14 wnl, alb 3.2(L) Iron- 24(L), ferritin 8(L)- 8/9  Nutrition-Focused physical exam completed. Findings are no fat depletion, no muscle depletion, and no edema.   Diet Order:  Diet Heart Room service appropriate? Yes; Fluid consistency: Thin  EDUCATION NEEDS:   No education needs have been identified at this time  Skin:  Skin Assessment: Reviewed RN Assessment  Last BM:  10/28  Height:   Ht Readings from Last 1 Encounters:  11/19/16 _1  (1.778 m)    Weight:    Wt Readings from Last 1 Encounters:  11/20/16 160 lb (72.6 kg)    Ideal Body Weight:  75.4 kg  BMI:  Body mass index is 22.96 kg/m.  Estimated Nutritional Needs:   Kcal:  2000-2300kcal/day   Protein:  73-87g/day  Fluid:  >2L/day   Koleen Distance MS, RD, LDN Pager #[phone] After Hours Pager: [phone]

## 2016-11-20 NOTE — Progress Notes (Signed)
During report, pt stated he was having mild chest pain. SL nitro tablet brought and pt educated on new medication he was starting at bedtime, Ranexa, for chronic chest pain. Pt states he take this med at home. Pt stated he didn't need the nitro and he would be fine and would wait for  the ranexa. Pt requested meds early at 2015 for chest discomfort. Pt has no signs of distress and was pleasantly conversing in the room with family. Will continue to monitor.

## 2016-11-20 NOTE — Consult Note (Signed)
Reason for Consult:Jerking episodes  Referring Physician: Anselm Jungling  CC: Jerking episodes  HPI: Scott [name] is an 76 y.o. male with a history of cardiac disease who on yesterday at church had acute onset of feeling dizzy, chest pain and SOB.  Patient then stiffened and began to reach for people in a panic.  Has had multiple similar events since presentation.  Although he feels there may have been some loss of consciousness with the initial event, those witnessed here in the hospital have not been associated with LOC.  Patient can recall events.  Consult called for further recommendations.   Recently patient has had a lot of chest pain and concerns surrounding his heart.   Patient presented in July of this year with progressive gait abnormalities that were felt to have a anxiety component.    Past Medical History:  Diagnosis Date  . Arthritis    right hip, since a fall  . CAD (coronary artery disease)    a. 04/2012 Cath: 3VD->Med Rx;  b. 04/2013 PCI RCA (2 DES); c. 03/2014 PCI: LAD 80 (3.0x23 Xience Alpine DES), 30 ISR in RCA; d. 06/2014 CABG x 2 ([facility]) LIMA->LAD, VG->OM; e. 11/2014 Neg MV;  f. 12/2014 Cath ([facility]): patent grafts->Med Rx; g. 03/2015 MV ([facility]) low risk, EF 40%; h. 05/2015 MV: low risk; i. 08/2015 Cath: patent grafts, patent RCA/LAD stents, small LCX/OM->Med Rx.  . Cardiomyopathy, ischemic    a. 04/2012 Echo: EF 40-45%;  b. 08/2013 Echo: EF 45-50%, mild glob HK, mod lat/post HK, diast dysfxn, mildly dil LA, mild MR, nl RVSP; c. 01/2015 Echo: EF 40-45%, Gr 1 DD, mildly dil LA.  . Carotid arterial disease ([place])    a. 05/2013 Carotid U/S; bilat 40-50% ICA stenosis.  . Chronic Chest Pain   . Chronic systolic CHF (congestive heart failure) ([place])    a. 01/2015 Echo: EF 40-45%.  . Cough   . Dyspnea    pt. unsure of exertion cause- that creates SOB at times   . Headache 1970's   migraine  . History of blood transfusion 2016   post op  . Hyperlipidemia   . Hypertension   . Iron deficiency  anemia due to chronic blood loss 09/11/2016  . Myocardial infarction [facility])    unsure of when  . Pulmonary emboli ([place]) 07/2016    Past Surgical History:  Procedure Laterality Date  . CARDIAC CATHETERIZATION  05/01/2012  . CARDIAC CATHETERIZATION  04/2013   [facility];x3 stent  . CARDIAC CATHETERIZATION  01/11/15    [facility]  . CARDIAC CATHETERIZATION N/A 09/16/2015   Procedure: LEFT HEART CATH AND CORS/GRAFTS ANGIOGRAPHY;  Surgeon: Minna Merritts, MD;  Location: [place] [facility];  Service: Cardiovascular;  Laterality: N/A;  . CARPAL TUNNEL RELEASE     right hand  . CATARACT EXTRACTION     LEFT  . CATARACT EXTRACTION W/PHACO Left 09/16/2014   Procedure: CATARACT EXTRACTION PHACO AND INTRAOCULAR LENS PLACEMENT (IOC);  Surgeon: Leandrew Koyanagi, MD;  Location: [place];  Service: Ophthalmology;  Laterality: Left;  . COLONOSCOPY    . COLONOSCOPY WITH PROPOFOL N/A 01/28/2016   Procedure: COLONOSCOPY WITH PROPOFOL;  Surgeon: Lollie Sails, MD;  Location: [facility] ENDOSCOPY;  Service: Endoscopy;  Laterality: N/A;  . CORONARY ANGIOPLASTY WITH STENT PLACEMENT  04/13/2014  . CORONARY ARTERY BYPASS GRAFT  06-26-14   x3 bypasses  . DE QUERVAIN'S RELEASE Left 08/22/2012  . ESOPHAGOGASTRODUODENOSCOPY (EGD) WITH PROPOFOL N/A 04/26/2015   Procedure: ESOPHAGOGASTRODUODENOSCOPY (EGD) WITH PROPOFOL;  Surgeon:  Hulen Luster, MD;  Location: [facility] ENDOSCOPY;  Service: Gastroenterology;  Laterality: N/A;  . RIB PLATING N/A 05/25/2016   Procedure: STERNAL PLATING;  Surgeon: Melrose Nakayama, MD;  Location: [place];  Service: Thoracic;  Laterality: N/A;  . right shoulder    . STERNAL WIRES REMOVAL N/A 03/30/2016   Procedure: STERNAL WIRES REMOVAL;  Surgeon: Melrose Nakayama, MD;  Location: [facility];  Service: Thoracic;  Laterality: N/A;  . TONSILLECTOMY      Family History  Problem Relation Age of Onset  . Heart attack Father 62       MI  . Cancer Maternal Uncle     Social History:  reports that he has  quit smoking. His smoking use included Cigarettes. He started smoking about 7 years ago. He has a 35.00 pack-year smoking history. He has never used smokeless tobacco. He reports that he does not drink alcohol or use drugs.  Allergies  Allergen Reactions  . Contrast Media [Iodinated Diagnostic Agents] Hives  . Oxycodone Hcl Itching  . Vicodin [Hydrocodone-Acetaminophen] Itching    Medications:  I have reviewed the patient's current medications. Prior to Admission:  Prescriptions Prior to Admission  Medication Sig Dispense Refill Last Dose  . atorvastatin (LIPITOR) 80 MG tablet Take 80 mg by mouth at bedtime.   11/18/2016 at 2200  . carvedilol (COREG) 3.125 MG tablet Take 1 tablet (3.125 mg total) by mouth daily before breakfast. 90 tablet 3 11/19/2016 at 0800  . isosorbide mononitrate (IMDUR) 60 MG 24 hr tablet Take 60 mg by mouth daily before breakfast.    11/19/2016 at 0700  . ramipril (ALTACE) 2.5 MG capsule Take 2.5 mg by mouth daily.   11/19/2016 at 0800  . ranolazine (RANEXA) 500 MG 12 hr tablet Take 500 mg by mouth 2 (two) times daily.  60 tablet 0 11/19/2016 at 0800  . sertraline (ZOLOFT) 25 MG tablet Take 25 mg by mouth daily.   11/19/2016 at 0800  . XARELTO 20 MG TABS tablet Take 1 tablet (20 mg total) by mouth daily. 90 tablet 2 11/19/2016 at 0800  . diphenhydramine-acetaminophen (TYLENOL PM) 25-500 MG TABS tablet Take 2 tablets by mouth at bedtime as needed. For pain/sleep   prn at prn  . nitroGLYCERIN (NITROSTAT) 0.4 MG SL tablet Place 0.4 mg under the tongue every 5 (five) minutes as needed for chest pain.   prn at prn  . traMADol (ULTRAM) 50 MG tablet Take 1-2 tablets (50-100 mg total) by mouth every 6 (six) hours as needed for moderate pain. (Patient taking differently: Take 50 mg by mouth every 6 (six) hours as needed for moderate pain. ) 30 tablet 0 prn at prn  . zolpidem (AMBIEN) 5 MG tablet Take 5 mg by mouth at bedtime as needed for sleep.    prn at prn   Scheduled: .  atorvastatin  80 mg Oral QHS  . carvedilol  3.125 mg Oral QAC breakfast  . docusate sodium  100 mg Oral BID  . feeding supplement (ENSURE ENLIVE)  237 mL Oral BID BM  . multivitamin with minerals  1 tablet Oral Daily  . pantoprazole  40 mg Oral Daily  . ramipril  2.5 mg Oral Daily  . ranolazine  500 mg Oral BID  . rivaroxaban  20 mg Oral Daily    ROS: History obtained from the patient  General ROS: negative for - chills, fatigue, fever, night sweats, weight gain or weight loss Psychological ROS: negative for - behavioral disorder,  hallucinations, memory difficulties, mood swings or suicidal ideation Ophthalmic ROS: negative for - blurry vision, double vision, eye pain or loss of vision ENT ROS: negative for - epistaxis, nasal discharge, oral lesions, sore throat, tinnitus or vertigo Allergy and Immunology ROS: negative for - hives or itchy/watery eyes Hematological and Lymphatic ROS: negative for - bleeding problems, bruising or swollen lymph nodes Endocrine ROS: negative for - galactorrhea, hair pattern changes, polydipsia/polyuria or temperature intolerance Respiratory ROS: shortness of breath Cardiovascular ROS: chest pain Gastrointestinal ROS: negative for - abdominal pain, diarrhea, hematemesis, nausea/vomiting or stool incontinence Genito-Urinary ROS: negative for - dysuria, hematuria, incontinence or urinary frequency/urgency Musculoskeletal ROS: negative for - joint swelling or muscular weakness Neurological ROS: as noted in HPI Dermatological ROS: negative for rash and skin lesion changes  Physical Examination: Blood pressure 126/75, pulse 75, temperature 97.7 F (36.5 C), temperature source Oral, resp. rate 18, height 5\' 10"  (1.778 m), weight 72.6 kg (160 lb), SpO2 100 %.  HEENT-  Normocephalic, no lesions, without obvious abnormality.  Normal external eye and conjunctiva.  Normal TM's bilaterally.  Normal auditory canals and external ears. Normal external nose, mucus  membranes and septum.  Normal pharynx. Cardiovascular- S1, S2 normal, pulses palpable throughout   Lungs- chest clear, no wheezing, rales, normal symmetric air entry Abdomen- soft, non-tender; bowel sounds normal; no masses,  no organomegaly Extremities- no edema Lymph-no adenopathy palpable Musculoskeletal-no joint tenderness, deformity or swelling Skin-warm and dry, no hyperpigmentation, vitiligo, or suspicious lesions  Neurological Examination   Mental Status: Alert, oriented, thought content appropriate.  Speech fluent without evidence of aphasia.  Able to follow 3 step commands without difficulty. Cranial Nerves: II: Discs flat bilaterally; Visual fields grossly normal, pupils equal, round, reactive to light and accommodation III,IV, VI: ptosis not present, extra-ocular motions intact bilaterally V,VII: smile symmetric, facial light touch sensation normal bilaterally VIII: hearing normal bilaterally IX,X: gag reflex present XI: bilateral shoulder shrug XII: midline tongue extension Motor: Right : Upper extremity   5/5    Left:     Upper extremity   5/5  Lower extremity   5/5     Lower extremity   5/5 Tone and bulk:normal tone throughout; no atrophy noted Sensory: Pinprick and light touch intact throughout, bilaterally. Decreased vibratory sensation in the lower extremities to the hips bilaterally  Deep Tendon Reflexes: 2+ and symmetric throughout Plantars: Right: mute   Left: mute Cerebellar: normal finger-to-nose, normal rapid alternating movements and normal heel-to-shin testing bilaterally Gait: not tested due to safety concerns      Laboratory Studies:   Basic Metabolic Panel:  Recent Labs Lab 11/19/16 1337 11/20/16 0541  NA 137 139  K 4.5 4.1  CL 101 108  CO2 28 28  GLUCOSE 111* 92  BUN 23* 16  CREATININE 1.40* 1.21  CALCIUM 9.3 8.5*    Liver Function Tests:  Recent Labs Lab 11/19/16 1337 11/20/16 0541  AST 23 22  ALT 20 17  ALKPHOS 44 40   BILITOT 0.7 0.6  PROT 7.7 6.2*  ALBUMIN 3.8 3.2*   No results for input(s): LIPASE, AMYLASE in the last 168 hours. No results for input(s): AMMONIA in the last 168 hours.  CBC:  Recent Labs Lab 11/19/16 1337 11/20/16 0541  WBC 6.2 4.8  NEUTROABS 3.7  --   HGB 15.3 14.1  HCT 46.4 41.6  MCV 83.6 83.2  PLT 263 208    Cardiac Enzymes:  Recent Labs Lab 11/19/16 1337  CKTOTAL 24  TROPONINI <0.03  BNP: Invalid input(s): POCBNP  CBG: No results for input(s): GLUCAP in the last 168 hours.  Microbiology: Results for orders placed or performed during the hospital encounter of 11/07/16  MRSA PCR Screening     Status: None   Collection Time: 11/07/16  4:17 PM  Result Value Ref Range Status   MRSA by PCR NEGATIVE NEGATIVE Final    Comment:        The GeneXpert MRSA Assay (FDA approved for NASAL specimens only), is one component of a comprehensive MRSA colonization surveillance program. It is not intended to diagnose MRSA infection nor to guide or monitor treatment for MRSA infections.     Coagulation Studies:  Recent Labs  11/19/16 1337  LABPROT 34.5*  INR 3.45    Urinalysis:  Recent Labs Lab 11/19/16 1512  COLORURINE AMBER*  LABSPEC 1.028  PHURINE 6.0  GLUCOSEU NEGATIVE  HGBUR NEGATIVE  BILIRUBINUR NEGATIVE  KETONESUR NEGATIVE  PROTEINUR 30*  NITRITE NEGATIVE  LEUKOCYTESUR NEGATIVE    Lipid Panel:     Component Value Date/Time   CHOL 103 11/07/2016 1851   CHOL 111 06/12/2016 0954   CHOL 122 12/15/2013 0748   TRIG 31 11/07/2016 1851   TRIG 76 12/15/2013 0748   HDL 41 11/07/2016 1851   HDL 41 06/12/2016 0954   HDL 51 12/15/2013 0748   CHOLHDL 2.5 11/07/2016 1851   VLDL 6 11/07/2016 1851   VLDL 15 12/15/2013 0748   LDLCALC 56 11/07/2016 1851   LDLCALC 53 06/12/2016 0954   LDLCALC 56 12/15/2013 0748    HgbA1C:  Lab Results  Component Value Date   HGBA1C 5.9 (H) 11/07/2016    Urine Drug Screen:  No results found for: LABOPIA,  COCAINSCRNUR, LABBENZ, AMPHETMU, THCU, LABBARB  Alcohol Level: No results for input(s): ETH in the last 168 hours.  Other results: EKG: normal sinus rhythm at 67 bpm.  Imaging: Ct Head Wo Contrast  Result Date: 11/19/2016 CLINICAL DATA:  Altered level of consciousness. EXAM: CT HEAD WITHOUT CONTRAST TECHNIQUE: Contiguous axial images were obtained from the base of the skull through the vertex without intravenous contrast. COMPARISON:  08/07/2016 FINDINGS: Brain: No evidence of acute infarction, hemorrhage, hydrocephalus, extra-axial collection or mass lesion/mass effect. Age congruent appearance of the brain. Vascular: No hyperdense vessel or unexpected calcification. Skull: Normal. Negative for fracture or focal lesion. Sinuses/Orbits: No acute finding. IMPRESSION: Unremarkable head CT. Electronically Signed   By: Monte Fantasia M.D.   On: 11/19/2016 14:13   Dg Chest Port 1 View  Result Date: 11/19/2016 CLINICAL DATA:  Pt arrived via EMS from church, Pt was at church today when he was told to stand, pt stood and sat back down and was confused. Pt doesn't remember what happened. Pt has involuntary movements and is unable to hold still. Short of breath. EXAM: PORTABLE CHEST 1 VIEW COMPARISON:  11/07/2016 FINDINGS: Status post sternotomy with sternal plating. Status post CABG. The heart size is normal. There are no focal consolidations or pleural effusions. No pulmonary edema. IMPRESSION: No evidence for acute cardiopulmonary abnormality. Electronically Signed   By: Nolon Nations M.D.   On: 11/19/2016 14:25     Assessment/Plan: 77 year old male with a history of cardiac disease presenting with episodes of involuntary movements.  Concern is for panic attacks.  Will rule out other etiologies.  Head CT reviewed and shows no acute changes.  It should also be noted that the patient's symptoms seemed to escalate with the start and increase in Ranexa.  Unclear  if the patient may be having some mild  side effects that are being built upon.    Recommendations: 1.  EEG 2.  MRI of the brain with and without contrast 3.  Would discuss with cardiology whether discontinuation or decrease in Ranexa can be considered.   4.  Orthostatics 5.  If above unremarkable would consider treatment for anxiety/panic attacks   Alexis Goodell, MD Neurology [phone] 11/20/2016, 2:05 PM

## 2016-11-21 DIAGNOSIS — R4182 Altered mental status, unspecified: Secondary | ICD-10-CM | POA: Diagnosis not present

## 2016-11-21 DIAGNOSIS — R259 Unspecified abnormal involuntary movements: Secondary | ICD-10-CM | POA: Diagnosis not present

## 2016-11-21 DIAGNOSIS — R69 Illness, unspecified: Secondary | ICD-10-CM | POA: Diagnosis not present

## 2016-11-21 DIAGNOSIS — I429 Cardiomyopathy, unspecified: Secondary | ICD-10-CM | POA: Diagnosis not present

## 2016-11-21 DIAGNOSIS — G40409 Other generalized epilepsy and epileptic syndromes, not intractable, without status epilepticus: Secondary | ICD-10-CM | POA: Diagnosis not present

## 2016-11-21 DIAGNOSIS — R55 Syncope and collapse: Secondary | ICD-10-CM | POA: Diagnosis not present

## 2016-11-21 LAB — GLUCOSE, CAPILLARY: Glucose-Capillary: 102 mg/dL — ABNORMAL HIGH (ref 65–99)

## 2016-11-21 MED ORDER — PANTOPRAZOLE SODIUM 40 MG PO TBEC: 40.0000 mg | DELAYED_RELEASE_TABLET | Freq: Every day | ORAL | 0 refills | Status: DC

## 2016-11-21 MED ORDER — SERTRALINE HCL 50 MG PO TABS: 50.0000 mg | ORAL_TABLET | Freq: Every day | ORAL | 0 refills | Status: DC

## 2016-11-21 NOTE — Progress Notes (Signed)
Patient discharged home as ordered,instructions explained and well understood,follow up appointments made,vital signs within normal limits upon discharge,escorted by spouse and staff member via wheel chair. 

## 2016-11-21 NOTE — Progress Notes (Addendum)
Subjective: Patient continues to have complaints of chest pain but has had no further involuntary movements  Objective: Current vital signs: BP 134/75 (BP Location: Right Arm)   Pulse 66   Temp 97.6 F (36.4 C) (Oral)   Resp 18   Ht 5\' 10"  (1.778 m)   Wt 73.8 kg (162 lb 9.6 oz)   SpO2 98%   BMI 23.33 kg/m  Vital signs in last 24 hours: Temp:  [97.4 F (36.3 C)-97.6 F (36.4 C)] 97.6 F (36.4 C) (10/30 0739) Pulse Rate:  [62-66] 66 (10/30 0739) Resp:  [18] 18 (10/30 0739) BP: (105-134)/(63-75) 134/75 (10/30 0739) SpO2:  [98 %-100 %] 98 % (10/30 0739) Weight:  [73.8 kg (162 lb 9.6 oz)] 73.8 kg (162 lb 9.6 oz) (10/30 0308)  Intake/Output from previous day: 10/29 0701 - 10/30 0700 In: 2853.3 [P.O.:1080; I.V.:1773.3] Out: 0  Intake/Output this shift: Total I/O In: 120 [P.O.:120] Out: -  Nutritional status: Diet Heart Room service appropriate? Yes; Fluid consistency: Thin  Neurologic Exam: Mental Status: Alert, oriented, thought content appropriate.  Speech fluent without evidence of aphasia.  Able to follow 3 step commands without difficulty. Cranial Nerves: II: Discs flat bilaterally; Visual fields grossly normal, pupils equal, round, reactive to light and accommodation III,IV, VI: ptosis not present, extra-ocular motions intact bilaterally V,VII: smile symmetric, facial light touch sensation normal bilaterally VIII: hearing normal bilaterally IX,X: gag reflex present XI: bilateral shoulder shrug XII: midline tongue extension Motor: Right :  Upper extremity   5/5                                      Left:     Upper extremity   5/5             Lower extremity   5/5                                                  Lower extremity   5/5 Tone and bulk:normal tone throughout; no atrophy noted   Lab Results: Basic Metabolic Panel:  Recent Labs Lab 11/19/16 1337 11/20/16 0541  NA 137 139  K 4.5 4.1  CL 101 108  CO2 28 28  GLUCOSE 111* 92  BUN 23* 16  CREATININE  1.40* 1.21  CALCIUM 9.3 8.5*    Liver Function Tests:  Recent Labs Lab 11/19/16 1337 11/20/16 0541  AST 23 22  ALT 20 17  ALKPHOS 44 40  BILITOT 0.7 0.6  PROT 7.7 6.2*  ALBUMIN 3.8 3.2*   No results for input(s): LIPASE, AMYLASE in the last 168 hours. No results for input(s): AMMONIA in the last 168 hours.  CBC:  Recent Labs Lab 11/19/16 1337 11/20/16 0541  WBC 6.2 4.8  NEUTROABS 3.7  --   HGB 15.3 14.1  HCT 46.4 41.6  MCV 83.6 83.2  PLT 263 208    Cardiac Enzymes:  Recent Labs Lab 11/19/16 1337  CKTOTAL 22  TROPONINI <0.03    Lipid Panel: No results for input(s): CHOL, TRIG, HDL, CHOLHDL, VLDL, LDLCALC in the last 168 hours.  CBG:  Recent Labs Lab 11/21/16 0750  GLUCAP 102*    Microbiology: Results for orders placed or performed during the hospital encounter of 11/07/16  MRSA PCR Screening  Status: None   Collection Time: 11/07/16  4:17 PM  Result Value Ref Range Status   MRSA by PCR NEGATIVE NEGATIVE Final    Comment:        The GeneXpert MRSA Assay (FDA approved for NASAL specimens only), is one component of a comprehensive MRSA colonization surveillance program. It is not intended to diagnose MRSA infection nor to guide or monitor treatment for MRSA infections.     Coagulation Studies:  Recent Labs  11/19/16 1337  LABPROT 34.5*  INR 3.45    Imaging: Ct Head Wo Contrast  Result Date: 11/19/2016 CLINICAL DATA:  Altered level of consciousness. EXAM: CT HEAD WITHOUT CONTRAST TECHNIQUE: Contiguous axial images were obtained from the base of the skull through the vertex without intravenous contrast. COMPARISON:  08/07/2016 FINDINGS: Brain: No evidence of acute infarction, hemorrhage, hydrocephalus, extra-axial collection or mass lesion/mass effect. Age congruent appearance of the brain. Vascular: No hyperdense vessel or unexpected calcification. Skull: Normal. Negative for fracture or focal lesion. Sinuses/Orbits: No acute  finding. IMPRESSION: Unremarkable head CT. Electronically Signed   By: Monte Fantasia M.D.   On: 11/19/2016 14:13   Mr Jeri Cos OA Contrast  Result Date: 11/20/2016 CLINICAL DATA:  Abnormal involuntary movements. Acute change yesterday at church. EXAM: MRI HEAD WITHOUT AND WITH CONTRAST TECHNIQUE: Multiplanar, multiecho pulse sequences of the brain and surrounding structures were obtained without and with intravenous contrast. CONTRAST:  38mL MULTIHANCE GADOBENATE DIMEGLUMINE 529 MG/ML IV SOLN COMPARISON:  CT head without contrast 11/19/2016. MRI brain 08/07/2016 FINDINGS: Brain: Scattered punctate foci of subcortical T2 hyperintensity bilaterally are stable. No acute infarct, hemorrhage, or mass lesion is present. The ventricles are of normal size. No significant extraaxial fluid collection is present. There is no significant progression of white matter disease. Internal auditory canals are within normal limits bilaterally. The brainstem and cerebellum are normal. The postcontrast images demonstrate no pathologic enhancement. Dedicated imaging of the temporal lobes demonstrate symmetric size and signal of the hippocampal structures. No discrete lesion or enhancement is present. Vascular: Flow is present in the major intracranial arteries. Skull and upper cervical spine: The skullbase is within normal limits. The craniocervical junction is normal. Midline sagittal structures are unremarkable. Sinuses/Orbits: Fluid levels are present in the maxillary sinuses bilaterally. There is mild mucosal thickening within the anterior ethmoid air cells as well. There is fluid the nose. Frontal sinuses and sphenoid sinuses are clear. Mastoid air cells are clear. Bilateral lens replacements are present. The globes and orbits are within normal limits. IMPRESSION: 1. Stable appearance of scattered subcortical T2 hyperintensities bilaterally, mildly advanced for age. 2. No acute intracranial abnormality. 3. No acute or focal  lesion to explain seizures or mental status changes. 4. Bilateral maxillary and ethmoid sinusitis. Electronically Signed   By: San Morelle M.D.   On: 11/20/2016 16:14   Dg Chest Port 1 View  Result Date: 11/19/2016 CLINICAL DATA:  Pt arrived via EMS from church, Pt was at church today when he was told to stand, pt stood and sat back down and was confused. Pt doesn't remember what happened. Pt has involuntary movements and is unable to hold still. Short of breath. EXAM: PORTABLE CHEST 1 VIEW COMPARISON:  11/07/2016 FINDINGS: Status post sternotomy with sternal plating. Status post CABG. The heart size is normal. There are no focal consolidations or pleural effusions. No pulmonary edema. IMPRESSION: No evidence for acute cardiopulmonary abnormality. Electronically Signed   By: Nolon Nations M.D.   On: 11/19/2016 14:25    Medications:  I have reviewed the patient's current medications. Scheduled: . atorvastatin  80 mg Oral QHS  . carvedilol  3.125 mg Oral QAC breakfast  . docusate sodium  100 mg Oral BID  . feeding supplement (ENSURE ENLIVE)  237 mL Oral BID BM  . multivitamin with minerals  1 tablet Oral Daily  . pantoprazole  40 mg Oral Daily  . ramipril  2.5 mg Oral Daily  . ranolazine  500 mg Oral BID  . rivaroxaban  20 mg Oral Daily    Assessment/Plan: Patient with no further events.  EEG unremarkable.  MRI of the brain reviewed and shows no acute changes.  Cardiology would prefer patient to stay on Ranexa since continues to have chest pain.  Likelihood of current presentation being related to this medication is low as well.  Patient on Zoloft at home.    Recommendations: 1.  Increase Zoloft to 50mg  qhs 2.  Patient to seek biofeedback on an outpatient basis 3.  Patient to continue follow up with cardiology   LOS: 0 days   Alexis Goodell, MD Neurology [phone] 11/21/2016  12:57 PM

## 2016-11-21 NOTE — Discharge Summary (Signed)
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 Arthritis    right hip, since a fall  . CAD (coronary artery disease)    a. 04/2012 Cath: 3VD->Med Rx;  b. 04/2013 PCI RCA (2 DES); c. 03/2014 PCI: LAD 80 (3.0x23 Xience Alpine DES), 30 ISR in RCA; d. 06/2014 CABG x 2 ([facility]) LIMA->LAD, VG->OM; e. 11/2014 Neg MV;  f. 12/2014 Cath ([facility]): patent grafts->Med Rx; g. 03/2015 MV ([facility]) low risk, EF 40%; h. 05/2015 MV: low risk; i. 08/2015 Cath: patent grafts, patent RCA/LAD stents, small LCX/OM->Med Rx.  . Cardiomyopathy, ischemic    a. 04/2012 Echo: EF 40-45%;  b. 08/2013 Echo: EF 45-50%, mild glob HK, mod lat/post HK, diast dysfxn, mildly dil LA, mild MR, nl RVSP; c. 01/2015 Echo: EF 40-45%, Gr 1 DD, mildly dil LA.  . Carotid arterial disease ([place])    a. 05/2013 Carotid U/S; bilat 40-50% ICA stenosis.  . Chronic Chest Pain   . Chronic systolic CHF (congestive heart failure) ([place])    a. 01/2015 Echo: EF 40-45%.  . Cough   . Dyspnea    pt. unsure of exertion cause- that creates SOB at times   . Headache 1970's   migraine  . History of blood transfusion 2016   post op  . Hyperlipidemia   . Hypertension   . Iron deficiency anemia due to chronic blood loss 09/11/2016  . Myocardial infarction [facility])    unsure of when  . Pulmonary emboli ([place]) 07/2016    HOSPITAL COURSE:   * Myoclonic epilepsy   As per neurology- it does look like  Panic attack.    Ordered EEG, MRI, Orthostatic- no abnormalities found.    Suggest to discuss with cardiologist , if Ranexa can be d/ced , I spoke to Cardiologist- as per them, there is not any side effects like what pt mentioned. And pt have angina, so suggest to cont same dose.   Neurologist suggest to increase zoloft dose to 50 mg daily.  * CAD with hx of recurrent angina   COnt statin, xarelto, Ramipril, Coreg. ranexa.  * Hypertension   Coreg, ramipril.  * Hyperlipidmia   Atorvastatin.  DISCHARGE CONDITIONS:   Stable.  CONSULTS OBTAINED:  Treatment Team:  Alexis Goodell, MD  DRUG ALLERGIES:   Allergies  Allergen Reactions  . Contrast Media [Iodinated Diagnostic Agents] Hives  . Oxycodone Hcl Itching  . Vicodin [Hydrocodone-Acetaminophen] Itching    DISCHARGE MEDICATIONS:   Current Discharge Medication List    START taking these medications   Details  pantoprazole (PROTONIX) 40 MG tablet Take 1 tablet (40 mg total) by mouth daily. Qty: 30 tablet, Refills: 0      CONTINUE these medications which have CHANGED   Details  sertraline (ZOLOFT) 50 MG tablet Take 1 tablet (50 mg total) by mouth daily. Qty: 30 tablet, Refills: 0      CONTINUE these medications which have NOT CHANGED  Details  atorvastatin (LIPITOR) 80 MG tablet Take 80 mg by mouth at bedtime.    carvedilol (COREG) 3.125 MG tablet Take 1 tablet (3.125 mg total) by mouth daily before breakfast. Qty: 90 tablet, Refills: 3    isosorbide mononitrate (IMDUR) 60 MG 24 hr tablet Take 60 mg by mouth daily before breakfast.     ramipril (ALTACE) 2.5 MG capsule Take 2.5 mg by mouth daily.    ranolazine (RANEXA) 500 MG 12 hr tablet Take 500 mg by mouth 2 (two) times daily.  Qty: 60 tablet, Refills: 0    XARELTO 20 MG TABS tablet Take 1 tablet (20 mg total) by mouth daily. Qty: 90 tablet, Refills: 2    diphenhydramine-acetaminophen (TYLENOL PM) 25-500 MG TABS tablet Take 2 tablets by mouth at  bedtime as needed. For pain/sleep    nitroGLYCERIN (NITROSTAT) 0.4 MG SL tablet Place 0.4 mg under the tongue every 5 (five) minutes as needed for chest pain.    traMADol (ULTRAM) 50 MG tablet Take 1-2 tablets (50-100 mg total) by mouth every 6 (six) hours as needed for moderate pain. Qty: 30 tablet, Refills: 0   Associated Diagnoses: Post-op pain    zolpidem (AMBIEN) 5 MG tablet Take 5 mg by mouth at bedtime as needed for sleep.          DISCHARGE INSTRUCTIONS:    Follow with PMD in 1-2 weeks.  If you experience worsening of your admission symptoms, develop shortness of breath, life threatening emergency, suicidal or homicidal thoughts you must seek medical attention immediately by calling 911 or calling your MD immediately  if symptoms less severe.  You Must read complete instructions/literature along with all the possible adverse reactions/side effects for all the Medicines you take and that have been prescribed to you. Take any new Medicines after you have completely understood and accept all the possible adverse reactions/side effects.   Please note  You were cared for by a hospitalist during your hospital stay. If you have any questions about your discharge medications or the care you received while you were in the hospital after you are discharged, you can call the unit and asked to speak with the hospitalist on call if the hospitalist that took care of you is not available. Once you are discharged, your primary care physician will handle any further medical issues. Please note that NO REFILLS for any discharge medications will be authorized once you are discharged, as it is imperative that you return to your primary care physician (or establish a relationship with a primary care physician if you do not have one) for your aftercare needs so that they can reassess your need for medications and monitor your lab values.    Today   CHIEF COMPLAINT:   Chief Complaint  Patient  presents with  . Altered Mental Status    HISTORY OF PRESENT ILLNESS:  Scott [name]  is a 76 y.o. male with a known history of ASCVD, panic disorder, and HTN now with acute onset of syncope associated with diffuse myoclonus and confusion. Has had at least 5 episodes this AM, including one witnessed in ER. Ct head and labs negative. BP low. He is now admitted. No fever. No N/V/D. Denies CP. Does admit to some SOB.  VITAL SIGNS:  Blood pressure 134/75, pulse 66, temperature 97.6 F (36.4 C), temperature source Oral, resp. rate 18, height 5\' 10"  (1.778 m), weight 73.8 kg (162 lb 9.6 oz), SpO2 98 %.  I/O:   Intake/Output Summary (Last  24 hours) at 11/21/16 1342 Last data filed at 11/21/16 1008  Gross per 24 hour  Intake          2133.33 ml  Output                0 ml  Net          2133.33 ml    PHYSICAL EXAMINATION:  GENERAL:  76 y.o.-year-old patient lying in the bed with no acute distress.  EYES: Pupils equal, round, reactive to light and accommodation. No scleral icterus. Extraocular muscles intact.  HEENT: Head atraumatic, normocephalic. Oropharynx and nasopharynx clear.  NECK:  Supple, no jugular venous distention. No thyroid enlargement, no tenderness.  LUNGS: Normal breath sounds bilaterally, no wheezing, rales,rhonchi or crepitation. No use of accessory muscles of respiration.  CARDIOVASCULAR: S1, S2 normal. No murmurs, rubs, or gallops.  ABDOMEN: Soft, non-tender, non-distended. Bowel sounds present. No organomegaly or mass.  EXTREMITIES: No pedal edema, cyanosis, or clubbing.  NEUROLOGIC: Cranial nerves II through XII are intact. Muscle strength 5/5 in all extremities. Sensation intact. Gait not checked.  PSYCHIATRIC: The patient is alert and oriented x 3.  SKIN: No obvious rash, lesion, or ulcer.   DATA REVIEW:   CBC  Recent Labs Lab 11/20/16 0541  WBC 4.8  HGB 14.1  HCT 41.6  PLT 208    Chemistries   Recent Labs Lab 11/20/16 0541  NA 139  K 4.1  CL 108   CO2 28  GLUCOSE 92  BUN 16  CREATININE 1.21  CALCIUM 8.5*  AST 22  ALT 17  ALKPHOS 40  BILITOT 0.6    Cardiac Enzymes  Recent Labs Lab 11/19/16 [place] <0.03    Microbiology Results  Results for orders placed or performed during the hospital encounter of 11/07/16  MRSA PCR Screening     Status: None   Collection Time: 11/07/16  4:17 PM  Result Value Ref Range Status   MRSA by PCR NEGATIVE NEGATIVE Final    Comment:        The GeneXpert MRSA Assay (FDA approved for NASAL specimens only), is one component of a comprehensive MRSA colonization surveillance program. It is not intended to diagnose MRSA infection nor to guide or monitor treatment for MRSA infections.     RADIOLOGY:  Ct Head Wo Contrast  Result Date: 11/19/2016 CLINICAL DATA:  Altered level of consciousness. EXAM: CT HEAD WITHOUT CONTRAST TECHNIQUE: Contiguous axial images were obtained from the base of the skull through the vertex without intravenous contrast. COMPARISON:  08/07/2016 FINDINGS: Brain: No evidence of acute infarction, hemorrhage, hydrocephalus, extra-axial collection or mass lesion/mass effect. Age congruent appearance of the brain. Vascular: No hyperdense vessel or unexpected calcification. Skull: Normal. Negative for fracture or focal lesion. Sinuses/Orbits: No acute finding. IMPRESSION: Unremarkable head CT. Electronically Signed   By: Monte Fantasia M.D.   On: 11/19/2016 14:13   Mr Jeri Cos QP Contrast  Result Date: 11/20/2016 CLINICAL DATA:  Abnormal involuntary movements. Acute change yesterday at church. EXAM: MRI HEAD WITHOUT AND WITH CONTRAST TECHNIQUE: Multiplanar, multiecho pulse sequences of the brain and surrounding structures were obtained without and with intravenous contrast. CONTRAST:  55mL MULTIHANCE GADOBENATE DIMEGLUMINE 529 MG/ML IV SOLN COMPARISON:  CT head without contrast 11/19/2016. MRI brain 08/07/2016 FINDINGS: Brain: Scattered punctate foci of subcortical T2  hyperintensity bilaterally are stable. No acute infarct, hemorrhage, or mass lesion is present. The ventricles are of normal size. No significant extraaxial fluid collection is present. There is no significant  progression of white matter disease. Internal auditory canals are within normal limits bilaterally. The brainstem and cerebellum are normal. The postcontrast images demonstrate no pathologic enhancement. Dedicated imaging of the temporal lobes demonstrate symmetric size and signal of the hippocampal structures. No discrete lesion or enhancement is present. Vascular: Flow is present in the major intracranial arteries. Skull and upper cervical spine: The skullbase is within normal limits. The craniocervical junction is normal. Midline sagittal structures are unremarkable. Sinuses/Orbits: Fluid levels are present in the maxillary sinuses bilaterally. There is mild mucosal thickening within the anterior ethmoid air cells as well. There is fluid the nose. Frontal sinuses and sphenoid sinuses are clear. Mastoid air cells are clear. Bilateral lens replacements are present. The globes and orbits are within normal limits. IMPRESSION: 1. Stable appearance of scattered subcortical T2 hyperintensities bilaterally, mildly advanced for age. 2. No acute intracranial abnormality. 3. No acute or focal lesion to explain seizures or mental status changes. 4. Bilateral maxillary and ethmoid sinusitis. Electronically Signed   By: San Morelle M.D.   On: 11/20/2016 16:14   Dg Chest Port 1 View  Result Date: 11/19/2016 CLINICAL DATA:  Pt [place] via EMS from church, Pt was at church today when he was told to stand, pt stood and sat back down and was confused. Pt doesn't remember what happened. Pt has involuntary movements and is unable to hold still. Short of breath. EXAM: PORTABLE CHEST 1 VIEW COMPARISON:  11/07/2016 FINDINGS: Status post sternotomy with sternal plating. Status post CABG. The heart size is normal.  There are no focal consolidations or pleural effusions. No pulmonary edema. IMPRESSION: No evidence for acute cardiopulmonary abnormality. Electronically Signed   By: Nolon Nations M.D.   On: 11/19/2016 14:25    EKG:   Orders placed or performed during the hospital encounter of 11/19/16  . EKG 12-Lead  . EKG 12-Lead      Management plans discussed with the patient, family and they are in agreement.  CODE STATUS: Full code.    Code Status Orders        Start     Ordered   11/19/16 1715  Full code  Continuous     11/19/16 1715    Code Status History    Date Active Date Inactive Code Status Order ID Comments User Context   11/07/2016  4:14 PM 11/08/2016  7:57 PM Full Code [id number]  Vaughan Basta, MD Inpatient   08/04/2016  3:35 PM 08/09/2016  5:23 PM Full Code [id number]  Epifanio Lesches, MD ED   05/25/2016  2:22 PM 05/26/2016  5:12 PM Full Code [id number]  Nani Skillern, PA-C Inpatient   02/08/2015  1:07 PM 02/09/2015  6:43 PM Full Code [id number]  Epifanio Lesches, MD ED   12/07/2014  2:23 PM 12/08/2014  6:52 PM Full Code [id number]  Fritzi Mandes, MD Inpatient    Advance Directive Documentation     Most Recent Value  Type of Advance Directive  Healthcare Power of [place], Living will  Pre-existing out of facility DNR order (yellow form or pink MOST form)  -  "MOST" Form in Place?  -      TOTAL TIME TAKING CARE OF THIS PATIENT: 35 minutes.    Vaughan Basta M.D on 11/21/2016 at 1:42 PM  Between 7am to 6pm - Pager - [phone]  After 6pm go to [email or web address] - password [email or web address] [facility]  Office  [phone]  CC: Primary care physician; Madelyn Brunner, MD   Note:  This dictation was prepared with Dragon dictation along with smaller phrase technology. Any transcriptional errors that result from this process are unintentional.

## 2016-11-22 NOTE — Progress Notes (Signed)
Cardiology Office Note  Date:  11/24/2016   ID:  MURRAY DURRELL, DOB 02-28-1940, MRN [id number]  PCP:  Baxter Hire, MD   Chief Complaint  Patient presents with  . other    C/o chest pain and sob. Meds reviewed verbally with pt.    HPI:  Mr. Jeanty is a 76 year old gentleman with history of  CAD,  CABG 2 with LIMA to the LAD 06/2014, vein graft to the OM at [facility],  chronic chest pain not relieved with bypass surgery,  Redo median sternotomy for sternal plating  on 05/25/2016 EF 40 to 45% 01/2015 who presents for follow-up of his chronic angina pectoralis  hospitalization November 07, 2016 for chest pain Stress test showed no ischemia Hospital records reviewed with the patient in detail  Presented to the hospital November 19, 2016  Had involuntary leg movements, Described as myoclonus and mental status changes  CT scan of the head, was normal MRI performed, seen by neurology Showed sinusitis on MR  Felt to have possible panic attacks EEG unremarkable Zoloft was increased He was told not to drive by neurology for 1 month given acute onset of anxiety attack  chest discomfort despite titrating doses of isosorbide and ranexa.  On further discussion today he reports continued chest pain taking more nitro Shortness of breath which she feels is new for him, unable to exert himself Feels that something is wrong, different from previous chest pain symptoms These episodes feel different from before Last catheterizations August 2017, medical management at that time  EKG reviewed personally by myself on today's visit shows normal sinus rhythm, rate 63 bpm  Consider old inferior  MI, intraventricular conduction delay  Other past medical history reviewed Cardiac catheterization April 2014 Cardiac catheterization April 2015 with stent to the RCA Cardiac catheterization with stent placement March 2016, DES to the mid LAD Cath at [facility] 05/28/2014, 50% LAD lesion, distal RCA disease Notes  indicating bypass surgery June 2016 at [place] test in November 2016 for chronic chest pain showing no ischemia cardiac catheterization December 2016 at [facility] Multiple  hospital admissions with chronic chest pain, neg w/u  Referred from pain clinic in [place] to CT surg for consult Bone scan:diffuse increased uptake in the manubrium and upper sternum sternal wire removal on 03/30/2016. Redo median sternotomy for sternal plating by Dr. Roxan Hockey on 05/25/2016. lung densely adherent to the underside of the sternum and there was a small laceration of the left upper lobe  Other past medical history history of CAD dating back to April 2014 at which time he was found to have severe distal RCA disease with left to right collaterals, a subtotally occluded left circumflex, and moderate, diffuse LAD disease. He was initially medically managed.   In April 2015, he underwent repeat catheterization secondary to ongoing chest pain, showing stable moderate to severe disease. He then underwent successful RCA stenting.   In March 2016, due to recurrent chest pain, he underwent successful stenting of the LAD.    he had recurrent chest pain again in June 2016 and was seen at [facility].  He underwent CABG 2 with a LIMA to the LAD and a vein graft to the obtuse marginal.   following CABG, he continued to have persistent/chronic low-level left-sided and midsternal chest pain.  He had repeat hospitalizations  with stress testing in November at [facility], which was negative,  catheterization at [facility] in December 2016, which apparently showed patent grafts.  Lab work in the  hospital  showing total cholesterol 142, LDL 77, HDL 47  Echocardiogram 03/02/2014 showing normal LV function, essentially normal study  Echocardiogram 04/22/2012 showing ejection fraction 40%, no mention of wall motion abnormality, mild MR, TR  PMH:   has a past medical history of Arthritis; CAD (coronary artery disease);  Cardiomyopathy, ischemic; Carotid arterial disease ([place]); Chronic Chest Pain; Chronic systolic CHF (congestive heart failure) ([place]); Cough; Dyspnea; Headache (1970's); History of blood transfusion (2016); Hyperlipidemia; Hypertension; Iron deficiency anemia due to chronic blood loss (09/11/2016); Myocardial infarction [facility]); and Pulmonary emboli ([place]) (07/2016).  PSH:    Past Surgical History:  Procedure Laterality Date  . CARDIAC CATHETERIZATION  05/01/2012  . CARDIAC CAT[facility]  [facility]  . CARD[facility]AC CATHE[facility]IZATION  01/11/15    [facility]  . CARDIAC CATHETERIZATION N/A 09/16/2015   Procedure: LEFT HEART CATH AND CORS/GRAFTS ANGIOGRAPHY;  Surgeon: Minna Merritts, MD;  Location: [place] CV LAB;  Service: Cardiovascular;  Laterality: N/A;  . CARPAL TUNNEL RELEASE     right hand  . CATARACT EXTRACTION     LEFT  . CATARACT EXTRACTION W/PHACO Left 09/16/2014   Procedure: CATARACT EXTRACTION PHACO AND INTRAOCULAR LENS PLACEMENT (IOC);  Surgeon: Leandrew Koyanagi, MD;  Location: [place];  Service: Ophthalmology;  Laterality: Left;  . COLONOSCOPY    . COLONOSCOPY WITH PROPOFOL N/A 01/28/2016   Procedure: COLONOSCOPY WITH PROPOFOL;  Surgeon: Lollie Sails, MD;  Location: [facility] ENDOSCOPY;  Service: Endoscopy;  Laterality: N/A;  . CORONARY ANGIOPLASTY WITH STENT PLACEMENT  04/13/2014  . CORONARY ARTERY BYPASS GRAFT  06-26-14   x3 bypasses  . DE QUERVAIN'S RELEASE Left 08/22/2012  . ESOPHAGOGASTRODUODENOSCOPY (EGD) WITH PROPOFOL N/A 04/26/2015   Procedure: ESOPHAGOGASTRODUODENOSCOPY (EGD) WITH PROPOFOL;  Surgeon: Hulen Luster, MD;  Location: [facility] ENDOSCOPY;  Service: Gastroenterology;  Laterality: N/A;  . RIB PLATING N/A 05/25/2016   Procedure: STERNAL PLATING;  Surgeon: Melrose Nakayama, MD;  Location: [place];  Service: Thoracic;  Laterality: N/A;  . right shoulder    . STERNAL WIRES REMOVAL N/A 03/30/2016   Procedure: STERNAL WIRES REMOVAL;  Surgeon: Melrose Nakayama,  MD;  Location: [place];  Service: Thoracic;  Laterality: N/A;  . TONSILLECTOMY      Current Outpatient Prescriptions  Medication Sig Dispense Refill  . atorvastatin (LIPITOR) 80 MG tablet Take 80 mg by mouth at bedtime.    . carvedilol (COREG) 3.125 MG tablet Take 1 tablet (3.125 mg total) by mouth daily before breakfast. 90 tablet 3  . diphenhydramine-acetaminophen (TYLENOL PM) 25-500 MG TABS tablet Take 2 tablets by mouth at bedtime as needed. For pain/sleep    . isosorbide mononitrate (IMDUR) 60 MG 24 hr tablet Take 60 mg by mouth daily before breakfast.     . nitroGLYCERIN (NITROSTAT) 0.4 MG SL tablet Place 0.4 mg under the tongue every 5 (five) minutes as needed for chest pain.    . pantoprazole (PROTONIX) 40 MG tablet Take 1 tablet (40 mg total) by mouth daily. 30 tablet 0  . ramipril (ALTACE) 2.5 MG capsule Take 2.5 mg by mouth daily.    . sertraline (ZOLOFT) 50 MG tablet Take 1 tablet (50 mg total) by mouth daily. 30 tablet 0  . traMADol (ULTRAM) 50 MG tablet Take 1-2 tablets (50-100 mg total) by mouth every 6 (six) hours as needed for moderate pain. (Patient taking differently: Take 50 mg by mouth every 6 (six) hours as needed for moderate pain. ) 30 tablet 0  . XARELTO 20  MG TABS tablet Take 1 tablet (20 mg total) by mouth daily. 90 tablet 2  . zolpidem (AMBIEN) 5 MG tablet Take 5 mg by mouth at bedtime as needed for sleep.      No current facility-administered medications for this visit.      Allergies:   Contrast media [iodinated diagnostic agents]; Oxycodone hcl; and Vicodin [hydrocodone-acetaminophen]   Social History:  The patient  reports that he has quit smoking. His smoking use included Cigarettes. He started smoking about 7 years ago. He has a 35.00 pack-year smoking history. He has never used smokeless tobacco. He reports that he does not drink alcohol or use drugs.   Family History:   family history includes Cancer in his maternal uncle; Heart attack (age of onset: 22) in  his father.    Review of Systems: Review of Systems  Respiratory: Positive for shortness of breath.   Cardiovascular: Positive for chest pain.  Gastrointestinal: Negative.   Musculoskeletal: Negative.   Neurological: Negative.   Psychiatric/Behavioral: Negative.   All other systems reviewed and are negative.    PHYSICAL EXAM: VS:  BP 128/74 (BP Location: Left Arm, Patient Position: Sitting, Cuff Size: Normal)   Pulse 63   Ht 5\' 10"  (1.778 m)   Wt 166 lb 8 oz (75.5 kg)   BMI 23.89 kg/m  , BMI Body mass index is 23.89 kg/m. GEN: Well nourished, well developed, in no acute distress  HEENT: normal  Neck: no JVD, carotid bruits, or masses Cardiac: RRR; no murmurs, rubs, or gallops,no edema  Well healed mediastinal incision Respiratory:  clear to auscultation bilaterally, normal work of breathing GI: soft, nontender, nondistended, + BS MS: no deformity or atrophy  Skin: warm and dry, no rash Neuro:  Strength and sensation are intact Psych: euthymic mood, full affect   Recent Labs: 01/07/2016: B Natriuretic Peptide 28.0 11/07/2016: Magnesium 1.8 11/19/2016: TSH 3.714 11/20/2016: ALT 17; BUN 16; Creatinine, Ser 1.21; Hemoglobin 14.1; Platelets 208; Potassium 4.1; Sodium 139    Lipid Panel Lab Results  Component Value Date   CHOL 103 11/07/2016   HDL 41 11/07/2016   LDLCALC 56 11/07/2016   TRIG 31 11/07/2016      Wt Readings from Last 3 Encounters:  11/24/16 166 lb 8 oz (75.5 kg)  11/21/16 162 lb 9.6 oz (73.8 kg)  11/07/16 167 lb 15.9 oz (76.2 kg)       ASSESSMENT AND PLAN:  PAF (paroxysmal atrial fibrillation) (HCC)CBC with Differential/Platelet, INR/PT Maintaining normal sinus rhythm, no changes made to his medications On anticoagulation  Chest pain, unspecified chest pain type -  Long history of chest pain Ischemic component with atypical features, musculoskeletal issues, PE Small vessel disease next with component of anxiety Difficult to tell if  recurrent symptoms on this visit are from ischemia Recently in the hospital for chest pain symptoms several weeks ago with stress test showing no ischemia He does have large regions of fixed perfusion defect Reports having worsening shortness of breath and chest pain symptoms requiring increasing nitroglycerin consistent with unstable angina Discussed pros and cons of invasive cardiac catheterization After long discussion, we will schedule cardiac catheterization radial access at [facility] with Dr. Fletcher Anon, left radial given he has a LIMA graft  Coronary artery disease involving native coronary artery of native heart  Plan as above, suspect unstable angina Scheduled for cardiac catheterization I have reviewed the risks, indications, and alternatives to cardiac catheterization, possible angioplasty, and stenting with the patient. Risks include but are not limited  to bleeding, infection, vascular injury, stroke, myocardial infection, arrhythmia, kidney injury, radiation-related injury in the case of prolonged fluoroscopy use, emergency cardiac surgery, and death. The patient understands the risks of serious complication is 1-2 in 4709 with diagnostic cardiac cath and 1-2% or less with angioplasty/stenting.   Hyperlipidemia Cholesterol is at goal on the current lipid regimen. No changes to the medications were made.    Total encounter time more than 45 minutes  Greater than 50% was spent in counseling and coordination of care with the patient  Disposition:   F/U  6 month   Orders Placed This Encounter  Procedures  . EKG 12-Lead     Signed, Esmond Plants, M.D., Ph.D. 11/24/2016  [place], [place]

## 2016-11-22 NOTE — H&P (View-Only) (Signed)
Cardiology Office Note  Date:  11/24/2016   ID:  Scott [name], DOB 1940-09-21, MRN [id number]  PCP:  Baxter Hire, MD   Chief Complaint  Patient presents with  . other    C/o chest pain and sob. Meds reviewed verbally with pt.    HPI:  Scott [name] is a 76 year old gentleman with history of  CAD,  CABG 2 with LIMA to the LAD 06/2014, vein graft to the OM at [facility],  chronic chest pain not relieved with bypass surgery,  Redo median sternotomy for sternal plating  on 05/25/2016 EF 40 to 45% 01/2015 who presents for follow-up of his chronic angina pectoralis  hospitalization November 07, 2016 for chest pain Stress test showed no ischemia Hospital records reviewed with the patient in detail  Presented to the hospital November 19, 2016  Had involuntary leg movements, Described as myoclonus and mental status changes  CT scan of the head, was normal MRI performed, seen by neurology Showed sinusitis on MR  Felt to have possible panic attacks EEG unremarkable Zoloft was increased He was told not to drive by neurology for 1 month given acute onset of anxiety attack  chest discomfort despite titrating doses of isosorbide and ranexa.  On further discussion today he reports continued chest pain taking more nitro Shortness of breath which she feels is new for him, unable to exert himself Feels that something is wrong, different from previous chest pain symptoms These episodes feel different from before Last catheterizations August 2017, medical management at that time  EKG reviewed personally by myself on today's visit shows normal sinus rhythm, rate 63 bpm  Consider old inferior  MI, intraventricular conduction delay  Other past medical history reviewed Cardiac catheterization April 2014 Cardiac catheterization April 2015 with stent to the RCA Cardiac catheterization with stent placement March 2016, DES to the mid LAD Cath at [facility] 05/28/2014, 50% LAD lesion, distal RCA disease Notes  indicating bypass surgery June 2016 at [place] test in November 2016 for chronic chest pain showing no ischemia cardiac catheterization December 2016 at [facility] Multiple  hospital admissions with chronic chest pain, neg w/u  Referred from pain clinic in [place] to CT surg for consult Bone scan:diffuse increased uptake in the manubrium and upper sternum sternal wire removal on 03/30/2016. Redo median sternotomy for sternal plating by Dr. Roxan Hockey on 05/25/2016. lung densely adherent to the underside of the sternum and there was a small laceration of the left upper lobe  Other past medical history history of CAD dating back to April 2014 at which time he was found to have severe distal RCA disease with left to right collaterals, a subtotally occluded left circumflex, and moderate, diffuse LAD disease. He was initially medically managed.   In April 2015, he underwent repeat catheterization secondary to ongoing chest pain, showing stable moderate to severe disease. He then underwent successful RCA stenting.   In March 2016, due to recurrent chest pain, he underwent successful stenting of the LAD.    he had recurrent chest pain again in June 2016 and was seen at [facility].  He underwent CABG 2 with a LIMA to the LAD and a vein graft to the obtuse marginal.   following CABG, he continued to have persistent/chronic low-level left-sided and midsternal chest pain.  He had repeat hospitalizations  with stress testing in November at [facility], which was negative,  catheterization at [facility] in December 2016, which apparently showed patent grafts.  Lab work in the  hospital  showing total cholesterol 142, LDL 77, HDL 47  Echocardiogram 03/02/2014 showing normal LV function, essentially normal study  Echocardiogram 04/22/2012 showing ejection fraction 40%, no mention of wall motion abnormality, mild MR, TR  PMH:   has a past medical history of Arthritis; CAD (coronary artery disease);  Cardiomyopathy, ischemic; Carotid arterial disease ([place]); Chronic Chest Pain; Chronic systolic CHF (congestive heart failure) ([place]); Cough; Dyspnea; Headache (1970's); History of blood transfusion (2016); Hyperlipidemia; Hypertension; Iron deficiency anemia due to chronic blood loss (09/11/2016); Myocardial infarction [facility]); and Pulmonary emboli ([place]) (07/2016).  PSH:    Past Surgical History:  Procedure Laterality Date  . CARDIAC CATHETERIZATION  05/01/2012  . CARDIAC CATHETERIZATION  [facility]/2013   [facility]3 [facility]  . CARDIAC CATHETERIZATION  01/11/15    [facility]  . CARDIAC CATHETERIZATION N/A 09/16/2015   Procedure: LEFT HEART CATH AND CORS/GRAFTS ANGIOGRAPHY;  Surgeon: Minna Merritts, MD;  Location: [place] CV LAB;  Service: Cardiovascular;  Laterality: N/A;  . CARPAL TUNNEL RELEASE     right hand  . CATARACT EXTRACTION     LEFT  . CATARACT EXTRACTION W/PHACO Left 09/16/2014   Procedure: CATARACT EXTRACTION PHACO AND INTRAOCULAR LENS PLACEMENT (IOC);  Surgeon: Leandrew Koyanagi, MD;  Location: [place];  Service: Ophthalmology;  Laterality: Left;  . COLONOSCOPY    . COLONOSCOPY WITH PROPOFOL N/A 01/28/2016   Procedure: COLONOSCOPY WITH PROPOFOL;  Surgeon: Lollie Sails, MD;  Location: [facility] ENDOSCOPY;  Service: Endoscopy;  Laterality: N/A;  . CORONARY ANGIOPLASTY WITH STENT PLACEMENT  04/13/2014  . CORONARY ARTERY BYPASS GRAFT  06-26-14   x3 bypasses  . DE QUERVAIN'S RELEASE Left 08/22/2012  . ESOPHAGOGASTRODUODENOSCOPY (EGD) WITH PROPOFOL N/A 04/26/2015   Procedure: ESOPHAGOGASTRODUODENOSCOPY (EGD) WITH PROPOFOL;  Surgeon: Hulen Luster, MD;  Location: [facility] ENDOSCOPY;  Service: Gastroenterology;  Laterality: N/A;  . RIB PLATING N/A 05/25/2016   Procedure: STERNAL PLATING;  Surgeon: Melrose Nakayama, MD;  Location: [place];  Service: Thoracic;  Laterality: N/A;  . right shoulder    . STERNAL WIRES REMOVAL N/A 03/30/2016   Procedure: STERNAL WIRES REMOVAL;  Surgeon: Melrose Nakayama,  MD;  Location: [place];  Service: Thoracic;  Laterality: N/A;  . TONSILLECTOMY      Current Outpatient Prescriptions  Medication Sig Dispense Refill  . atorvastatin (LIPITOR) 80 MG tablet Take 80 mg by mouth at bedtime.    . carvedilol (COREG) 3.125 MG tablet Take 1 tablet (3.125 mg total) by mouth daily before breakfast. 90 tablet 3  . diphenhydramine-acetaminophen (TYLENOL PM) 25-500 MG TABS tablet Take 2 tablets by mouth at bedtime as needed. For pain/sleep    . isosorbide mononitrate (IMDUR) 60 MG 24 hr tablet Take 60 mg by mouth daily before breakfast.     . nitroGLYCERIN (NITROSTAT) 0.4 MG SL tablet Place 0.4 mg under the tongue every 5 (five) minutes as needed for chest pain.    . pantoprazole (PROTONIX) 40 MG tablet Take 1 tablet (40 mg total) by mouth daily. 30 tablet 0  . ramipril (ALTACE) 2.5 MG capsule Take 2.5 mg by mouth daily.    . sertraline (ZOLOFT) 50 MG tablet Take 1 tablet (50 mg total) by mouth daily. 30 tablet 0  . traMADol (ULTRAM) 50 MG tablet Take 1-2 tablets (50-100 mg total) by mouth every 6 (six) hours as needed for moderate pain. (Patient taking differently: Take 50 mg by mouth every 6 (six) hours as needed for moderate pain. ) 30 tablet 0  . XARELTO 20  MG TABS tablet Take 1 tablet (20 mg total) by mouth daily. 90 tablet 2  . zolpidem (AMBIEN) 5 MG tablet Take 5 mg by mouth at bedtime as needed for sleep.      No current facility-administered medications for this visit.      Allergies:   Contrast media [iodinated diagnostic agents]; Oxycodone hcl; and Vicodin [hydrocodone-acetaminophen]   Social History:  The patient  reports that he has quit smoking. His smoking use included Cigarettes. He started smoking about 7 years ago. He has a 35.00 pack-year smoking history. He has never used smokeless tobacco. He reports that he does not drink alcohol or use drugs.   Family History:   family history includes Cancer in his maternal uncle; Heart attack (age of onset: 60) in  his father.    Review of Systems: Review of Systems  Respiratory: Positive for shortness of breath.   Cardiovascular: Positive for chest pain.  Gastrointestinal: Negative.   Musculoskeletal: Negative.   Neurological: Negative.   Psychiatric/Behavioral: Negative.   All other systems reviewed and are negative.    PHYSICAL EXAM: VS:  BP 128/74 (BP Location: Left Arm, Patient Position: Sitting, Cuff Size: Normal)   Pulse 63   Ht 5\' 10"  (1.778 m)   Wt 166 lb 8 oz (75.5 kg)   BMI 23.89 kg/m  , BMI Body mass index is 23.89 kg/m. GEN: Well nourished, well developed, in no acute distress  HEENT: normal  Neck: no JVD, carotid bruits, or masses Cardiac: RRR; no murmurs, rubs, or gallops,no edema  Well healed mediastinal incision Respiratory:  clear to auscultation bilaterally, normal work of breathing GI: soft, nontender, nondistended, + BS MS: no deformity or atrophy  Skin: warm and dry, no rash Neuro:  Strength and sensation are intact Psych: euthymic mood, full affect   Recent Labs: 01/07/2016: B Natriuretic Peptide 28.0 11/07/2016: Magnesium 1.8 11/19/2016: TSH 3.714 11/20/2016: ALT 17; BUN 16; Creatinine, Ser 1.21; Hemoglobin 14.1; Platelets 208; Potassium 4.1; Sodium 139    Lipid Panel Lab Results  Component Value Date   CHOL 103 11/07/2016   HDL 41 11/07/2016   LDLCALC 56 11/07/2016   TRIG 31 11/07/2016      Wt Readings from Last 3 Encounters:  11/24/16 166 lb 8 oz (75.5 kg)  11/21/16 162 lb 9.6 oz (73.8 kg)  11/07/16 167 lb 15.9 oz (76.2 kg)       ASSESSMENT AND PLAN:  PAF (paroxysmal atrial fibrillation) (HCC)CBC with Differential/Platelet, INR/PT Maintaining normal sinus rhythm, no changes made to his medications On anticoagulation  Chest pain, unspecified chest pain type -  Long history of chest pain Ischemic component with atypical features, musculoskeletal issues, PE Small vessel disease next with component of anxiety Difficult to tell if  recurrent symptoms on this visit are from ischemia Recently in the hospital for chest pain symptoms several weeks ago with stress test showing no ischemia He does have large regions of fixed perfusion defect Reports having worsening shortness of breath and chest pain symptoms requiring increasing nitroglycerin consistent with unstable angina Discussed pros and cons of invasive cardiac catheterization After long discussion, we will schedule cardiac catheterization radial access at [facility] with Dr. Fletcher Anon, left radial given he has a LIMA graft  Coronary artery disease involving native coronary artery of native heart  Plan as above, suspect unstable angina Scheduled for cardiac catheterization I have reviewed the risks, indications, and alternatives to cardiac catheterization, possible angioplasty, and stenting with the patient. Risks include but are not limited  to bleeding, infection, vascular injury, stroke, myocardial infection, arrhythmia, kidney injury, radiation-related injury in the case of prolonged fluoroscopy use, emergency cardiac surgery, and death. The patient understands the risks of serious complication is 1-2 in 6808 with diagnostic cardiac cath and 1-2% or less with angioplasty/stenting.   Hyperlipidemia Cholesterol is at goal on the current lipid regimen. No changes to the medications were made.    Total encounter time more than 45 minutes  Greater than 50% was spent in counseling and coordination of care with the patient  Disposition:   F/U  6 month   Orders Placed This Encounter  Procedures  . EKG 12-Lead     Signed, Esmond Plants, M.D., Ph.D. 11/24/2016  [place], [place]

## 2016-11-23 DIAGNOSIS — R079 Chest pain, unspecified: Secondary | ICD-10-CM | POA: Diagnosis not present

## 2016-11-23 DIAGNOSIS — Z09 Encounter for follow-up examination after completed treatment for conditions other than malignant neoplasm: Secondary | ICD-10-CM | POA: Diagnosis not present

## 2016-11-24 ENCOUNTER — Encounter: Payer: Self-pay | Admitting: Cardiovascular Disease

## 2016-11-24 ENCOUNTER — Ambulatory Visit (INDEPENDENT_AMBULATORY_CARE_PROVIDER_SITE_OTHER): Payer: Medicare HMO | Admitting: Cardiovascular Disease

## 2016-11-24 VITALS — BP 128/74 | HR 63 | Ht 70.0 in | Wt 166.5 lb

## 2016-11-24 DIAGNOSIS — I779 Disorder of arteries and arterioles, unspecified: Secondary | ICD-10-CM | POA: Diagnosis not present

## 2016-11-24 DIAGNOSIS — Z951 Presence of aortocoronary bypass graft: Secondary | ICD-10-CM | POA: Diagnosis not present

## 2016-11-24 DIAGNOSIS — I739 Peripheral vascular disease, unspecified: Secondary | ICD-10-CM

## 2016-11-24 DIAGNOSIS — I255 Ischemic cardiomyopathy: Secondary | ICD-10-CM | POA: Diagnosis not present

## 2016-11-24 DIAGNOSIS — I10 Essential (primary) hypertension: Secondary | ICD-10-CM

## 2016-11-24 DIAGNOSIS — E782 Mixed hyperlipidemia: Secondary | ICD-10-CM | POA: Diagnosis not present

## 2016-11-24 DIAGNOSIS — I2699 Other pulmonary embolism without acute cor pulmonale: Secondary | ICD-10-CM

## 2016-11-24 DIAGNOSIS — F419 Anxiety disorder, unspecified: Secondary | ICD-10-CM | POA: Diagnosis not present

## 2016-11-24 DIAGNOSIS — Z955 Presence of coronary angioplasty implant and graft: Secondary | ICD-10-CM | POA: Diagnosis not present

## 2016-11-24 DIAGNOSIS — R69 Illness, unspecified: Secondary | ICD-10-CM | POA: Diagnosis not present

## 2016-11-24 DIAGNOSIS — I200 Unstable angina: Secondary | ICD-10-CM | POA: Diagnosis not present

## 2016-11-24 DIAGNOSIS — I480 Paroxysmal atrial fibrillation: Secondary | ICD-10-CM

## 2016-11-24 NOTE — Patient Instructions (Addendum)
Medication Instructions:   No medication changes made  Labwork:  No new labs needed  Testing/Procedures:  We will schedule a cardiac cath with Dr. Fletcher Anon  Stop xarelto 2 days prior to the cardiac cath  [facility] Cardiac Cath Instructions   You are scheduled for a Cardiac Cath on:_November 12th_  Please arrive at _09:30_am on the day of your procedure  Please expect a call from our [place] to pre-register you  Do not eat/drink anything after midnight  Someone will need to drive you home  It is recommended someone be with you for the first 24 hours after your procedure  Wear clothes that are easy to get on/off and wear slip on shoes if possible   Medications bring a current list of all medications with you  _X__ You may take all of your other medications the morning of your procedure with enough water to swallow safely  _X__ STOP XARELTO 2 days prior to procedure.   Day of your procedure: Arrive at the [facility] entrance.  Free valet service is available.  After entering the [facility] please check-in at the registration desk (1st desk on your right) to receive your armband. After receiving your armband someone will escort you to the cardiac cath/special procedures waiting area.  The usual length of stay after your procedure is about 2 to 3 hours.  This can vary.  If you have any questions, please call our office at [phone], or you may call the cardiac cath lab at [facility] directly at [phone]    Follow-Up: It was a pleasure seeing you in the office today. Please call us if you have new issues that need to be addressed before your next appt.  [phone]  Your physician wants you to follow-up in: 6 months.  You will receive a reminder letter in the mail two months in advance. If you don't receive a letter, please call our office to schedule the follow-up appointment.  If you need a refill on your cardiac medications before your next  appointment, please call your pharmacy.

## 2016-11-30 ENCOUNTER — Inpatient Hospital Stay: Payer: Medicare HMO | Attending: Oncology

## 2016-11-30 DIAGNOSIS — R718 Other abnormality of red blood cells: Secondary | ICD-10-CM

## 2016-11-30 DIAGNOSIS — Z79899 Other long term (current) drug therapy: Secondary | ICD-10-CM | POA: Insufficient documentation

## 2016-11-30 DIAGNOSIS — E785 Hyperlipidemia, unspecified: Secondary | ICD-10-CM | POA: Diagnosis not present

## 2016-11-30 DIAGNOSIS — I2699 Other pulmonary embolism without acute cor pulmonale: Secondary | ICD-10-CM | POA: Insufficient documentation

## 2016-11-30 DIAGNOSIS — I252 Old myocardial infarction: Secondary | ICD-10-CM | POA: Diagnosis not present

## 2016-11-30 DIAGNOSIS — I2510 Atherosclerotic heart disease of native coronary artery without angina pectoris: Secondary | ICD-10-CM | POA: Insufficient documentation

## 2016-11-30 DIAGNOSIS — I10 Essential (primary) hypertension: Secondary | ICD-10-CM | POA: Diagnosis not present

## 2016-11-30 DIAGNOSIS — I509 Heart failure, unspecified: Secondary | ICD-10-CM | POA: Diagnosis not present

## 2016-11-30 DIAGNOSIS — D509 Iron deficiency anemia, unspecified: Secondary | ICD-10-CM | POA: Insufficient documentation

## 2016-11-30 DIAGNOSIS — I255 Ischemic cardiomyopathy: Secondary | ICD-10-CM | POA: Insufficient documentation

## 2016-11-30 DIAGNOSIS — Z7901 Long term (current) use of anticoagulants: Secondary | ICD-10-CM | POA: Insufficient documentation

## 2016-11-30 DIAGNOSIS — Z87891 Personal history of nicotine dependence: Secondary | ICD-10-CM | POA: Insufficient documentation

## 2016-11-30 DIAGNOSIS — M1990 Unspecified osteoarthritis, unspecified site: Secondary | ICD-10-CM | POA: Diagnosis not present

## 2016-11-30 DIAGNOSIS — I4891 Unspecified atrial fibrillation: Secondary | ICD-10-CM | POA: Diagnosis not present

## 2016-11-30 DIAGNOSIS — I480 Paroxysmal atrial fibrillation: Secondary | ICD-10-CM

## 2016-11-30 LAB — CBC WITH DIFFERENTIAL/PLATELET
BASOS ABS: 0 10*3/uL (ref 0–0.1)
BASOS PCT: 1 %
EOS ABS: 0 10*3/uL (ref 0–0.7)
EOS PCT: 0 %
HCT: 42.9 % (ref 40.0–52.0)
Hemoglobin: 14.3 g/dL (ref 13.0–18.0)
LYMPHS PCT: 27 %
Lymphs Abs: 1.5 10*3/uL (ref 1.0–3.6)
MCH: 28.6 pg (ref 26.0–34.0)
MCHC: 33.3 g/dL (ref 32.0–36.0)
MCV: 85.8 fL (ref 80.0–100.0)
MONO ABS: 0.5 10*3/uL (ref 0.2–1.0)
Monocytes Relative: 9 %
Neutro Abs: 3.5 10*3/uL (ref 1.4–6.5)
Neutrophils Relative %: 63 %
PLATELETS: 196 10*3/uL (ref 150–440)
RBC: 5 MIL/uL (ref 4.40–5.90)
RDW: 22.8 % — AB (ref 11.5–14.5)
WBC: 5.6 10*3/uL (ref 3.8–10.6)

## 2016-11-30 LAB — IRON AND TIBC
IRON: 45 ug/dL (ref 45–182)
SATURATION RATIOS: 14 % — AB (ref 17.9–39.5)
TIBC: 330 ug/dL (ref 250–450)
UIBC: 285 ug/dL

## 2016-11-30 LAB — FERRITIN: FERRITIN: 15 ng/mL — AB (ref 24–336)

## 2016-12-01 ENCOUNTER — Telehealth: Payer: Self-pay | Admitting: Oncology

## 2016-12-01 LAB — PROTEIN S, TOTAL AND FREE
PROTEIN S AG FREE: 57 % (ref 57–157)
PROTEIN S AG TOTAL: 72 % (ref 60–150)

## 2016-12-01 NOTE — Telephone Encounter (Signed)
Patient reports feeling profound fatigue. He can not tolerate oral iron supplementation. Recent ferritin remains low.  Will move his appointment to next week with IV venofer infusion.

## 2016-12-03 ENCOUNTER — Other Ambulatory Visit: Payer: Self-pay | Admitting: Cardiovascular Disease

## 2016-12-03 DIAGNOSIS — I200 Unstable angina: Secondary | ICD-10-CM

## 2016-12-04 ENCOUNTER — Other Ambulatory Visit: Payer: Self-pay

## 2016-12-04 ENCOUNTER — Encounter: Payer: Self-pay | Admitting: Cardiovascular Disease

## 2016-12-04 ENCOUNTER — Telehealth: Payer: Self-pay | Admitting: Oncology

## 2016-12-04 ENCOUNTER — Ambulatory Visit
Admission: RE | Admit: 2016-12-04 | Discharge: 2016-12-04 | Disposition: A | Discharge status: Home / Self Care | Payer: Medicare HMO | Source: Ambulatory Visit | Attending: Cardiovascular Disease | Admitting: Cardiovascular Disease

## 2016-12-04 ENCOUNTER — Telehealth: Payer: Self-pay | Admitting: Cardiovascular Disease

## 2016-12-04 ENCOUNTER — Encounter
Admission: RE | Disposition: A | Discharge status: Home / Self Care | Payer: Self-pay | Source: Ambulatory Visit | Attending: Cardiovascular Disease

## 2016-12-04 DIAGNOSIS — Z7901 Long term (current) use of anticoagulants: Secondary | ICD-10-CM | POA: Insufficient documentation

## 2016-12-04 DIAGNOSIS — E785 Hyperlipidemia, unspecified: Secondary | ICD-10-CM | POA: Diagnosis not present

## 2016-12-04 DIAGNOSIS — I25709 Atherosclerosis of coronary artery bypass graft(s), unspecified, with unspecified angina pectoris: Secondary | ICD-10-CM | POA: Diagnosis not present

## 2016-12-04 DIAGNOSIS — Z86711 Personal history of pulmonary embolism: Secondary | ICD-10-CM | POA: Diagnosis not present

## 2016-12-04 DIAGNOSIS — I252 Old myocardial infarction: Secondary | ICD-10-CM | POA: Diagnosis not present

## 2016-12-04 DIAGNOSIS — G8929 Other chronic pain: Secondary | ICD-10-CM | POA: Diagnosis not present

## 2016-12-04 DIAGNOSIS — I110 Hypertensive heart disease with heart failure: Secondary | ICD-10-CM | POA: Insufficient documentation

## 2016-12-04 DIAGNOSIS — Z955 Presence of coronary angioplasty implant and graft: Secondary | ICD-10-CM | POA: Diagnosis not present

## 2016-12-04 DIAGNOSIS — I5022 Chronic systolic (congestive) heart failure: Secondary | ICD-10-CM | POA: Diagnosis not present

## 2016-12-04 DIAGNOSIS — I200 Unstable angina: Secondary | ICD-10-CM

## 2016-12-04 DIAGNOSIS — I25118 Atherosclerotic heart disease of native coronary artery with other forms of angina pectoris: Secondary | ICD-10-CM | POA: Insufficient documentation

## 2016-12-04 DIAGNOSIS — Z87891 Personal history of nicotine dependence: Secondary | ICD-10-CM | POA: Diagnosis not present

## 2016-12-04 DIAGNOSIS — I2582 Chronic total occlusion of coronary artery: Secondary | ICD-10-CM | POA: Diagnosis not present

## 2016-12-04 DIAGNOSIS — Z8249 Family history of ischemic heart disease and other diseases of the circulatory system: Secondary | ICD-10-CM | POA: Insufficient documentation

## 2016-12-04 DIAGNOSIS — I255 Ischemic cardiomyopathy: Secondary | ICD-10-CM | POA: Diagnosis not present

## 2016-12-04 DIAGNOSIS — I480 Paroxysmal atrial fibrillation: Secondary | ICD-10-CM | POA: Insufficient documentation

## 2016-12-04 DIAGNOSIS — Z951 Presence of aortocoronary bypass graft: Secondary | ICD-10-CM | POA: Insufficient documentation

## 2016-12-04 DIAGNOSIS — M1990 Unspecified osteoarthritis, unspecified site: Secondary | ICD-10-CM | POA: Diagnosis not present

## 2016-12-04 HISTORY — PX: LEFT HEART CATH AND CORONARY ANGIOGRAPHY: CATH118249

## 2016-12-04 SURGERY — LEFT HEART CATH
Anesthesia: Moderate Sedation

## 2016-12-04 SURGERY — LEFT HEART CATH AND CORONARY ANGIOGRAPHY
Anesthesia: Moderate Sedation

## 2016-12-04 MED ORDER — HEPARIN (PORCINE) IN NACL 2-0.9 UNIT/ML-% IJ SOLN
INTRAMUSCULAR | Status: AC
  Filled 2016-12-04: qty 500

## 2016-12-04 MED ORDER — MIDAZOLAM HCL 2 MG/2ML IJ SOLN
INTRAMUSCULAR | Status: DC | PRN
  Administered 2016-12-04: 1 mg via INTRAVENOUS

## 2016-12-04 MED ORDER — VERAPAMIL HCL 2.5 MG/ML IV SOLN
INTRAVENOUS | Status: AC
  Filled 2016-12-04: qty 2

## 2016-12-04 MED ORDER — DIPHENHYDRAMINE HCL 50 MG/ML IJ SOLN
INTRAMUSCULAR | Status: AC
  Filled 2016-12-04: qty 1

## 2016-12-04 MED ORDER — SODIUM CHLORIDE 0.9 % WEIGHT BASED INFUSION: 1.0000 mL/kg/h | INTRAVENOUS | Status: DC

## 2016-12-04 MED ORDER — NITROGLYCERIN 0.4 MG SL SUBL
SUBLINGUAL_TABLET | SUBLINGUAL | Status: AC
  Administered 2016-12-04: 0.4 mg
  Administered 2016-12-04: 10:00:00
  Filled 2016-12-04: qty 1

## 2016-12-04 MED ORDER — DIPHENHYDRAMINE HCL 50 MG/ML IJ SOLN
25.0000 mg | Freq: Once | INTRAMUSCULAR | Status: AC
Start: 2016-12-04 — End: 2016-12-04
  Administered 2016-12-04: 50 mg via INTRAVENOUS

## 2016-12-04 MED ORDER — MIDAZOLAM HCL 2 MG/2ML IJ SOLN
INTRAMUSCULAR | Status: AC
  Filled 2016-12-04: qty 2

## 2016-12-04 MED ORDER — SODIUM CHLORIDE 0.9% FLUSH: 3.0000 mL | INTRAVENOUS | Status: DC | PRN

## 2016-12-04 MED ORDER — HEPARIN SODIUM (PORCINE) 1000 UNIT/ML IJ SOLN
INTRAMUSCULAR | Status: DC | PRN
  Administered 2016-12-04: 4000 [IU] via INTRAVENOUS

## 2016-12-04 MED ORDER — SODIUM CHLORIDE 0.9% FLUSH: 3.0000 mL | Freq: Two times a day (BID) | INTRAVENOUS | Status: DC

## 2016-12-04 MED ORDER — METHYLPREDNISOLONE SODIUM SUCC 125 MG IJ SOLR
125.0000 mg | Freq: Once | INTRAMUSCULAR | Status: AC
  Administered 2016-12-04: 125 mg via INTRAVENOUS

## 2016-12-04 MED ORDER — METHYLPREDNISOLONE SODIUM SUCC 125 MG IJ SOLR
INTRAMUSCULAR | Status: AC
  Filled 2016-12-04: qty 2

## 2016-12-04 MED ORDER — HEPARIN SODIUM (PORCINE) 1000 UNIT/ML IJ SOLN
INTRAMUSCULAR | Status: AC
  Filled 2016-12-04: qty 1

## 2016-12-04 MED ORDER — FENTANYL CITRATE (PF) 100 MCG/2ML IJ SOLN
INTRAMUSCULAR | Status: DC | PRN
  Administered 2016-12-04: 50 ug via INTRAVENOUS

## 2016-12-04 MED ORDER — SODIUM CHLORIDE 0.9 % WEIGHT BASED INFUSION
3.0000 mL/kg/h | INTRAVENOUS | Status: AC
  Administered 2016-12-04: 3 mL/kg/h via INTRAVENOUS

## 2016-12-04 MED ORDER — VERAPAMIL HCL 2.5 MG/ML IV SOLN
INTRAVENOUS | Status: DC | PRN
  Administered 2016-12-04: 2.5 mg via INTRA_ARTERIAL

## 2016-12-04 MED ORDER — ASPIRIN 81 MG PO CHEW
81.0000 mg | CHEWABLE_TABLET | ORAL | Status: AC
  Administered 2016-12-04: 81 mg via ORAL

## 2016-12-04 MED ORDER — FENTANYL CITRATE (PF) 100 MCG/2ML IJ SOLN
INTRAMUSCULAR | Status: AC
  Filled 2016-12-04: qty 2

## 2016-12-04 MED ORDER — NITROGLYCERIN 0.4 MG SL SUBL: 0.4000 mg | SUBLINGUAL_TABLET | SUBLINGUAL | Status: DC | PRN

## 2016-12-04 MED ORDER — IOPAMIDOL (ISOVUE-300) INJECTION 61%
INTRAVENOUS | Status: DC | PRN
  Administered 2016-12-04: 125 mL via INTRA_ARTERIAL

## 2016-12-04 MED ORDER — ASPIRIN 81 MG PO CHEW
CHEWABLE_TABLET | ORAL | Status: AC
  Filled 2016-12-04: qty 1

## 2016-12-04 MED ORDER — SODIUM CHLORIDE 0.9 % IV SOLN: 250.0000 mL | INTRAVENOUS | Status: DC | PRN

## 2016-12-04 MED ORDER — SODIUM CHLORIDE 0.9 % IV SOLN: INTRAVENOUS | Status: AC

## 2016-12-04 SURGICAL SUPPLY — 7 items
CATH 5F 110X4 TIG (CATHETERS) ×2 IMPLANT
CATH INFINITI JR4 5F (CATHETERS) ×2 IMPLANT
DEVICE RAD TR BAND REGULAR (VASCULAR PRODUCTS) ×2 IMPLANT
GLIDESHEATH SLEND SS 6F .021 (SHEATH) ×2 IMPLANT
KIT MANI 3VAL PERCEP (MISCELLANEOUS) ×2 IMPLANT
PACK CARDIAC CATH (CUSTOM PROCEDURE TRAY) ×2 IMPLANT
WIRE ROSEN-J .035X260CM (WIRE) ×2 IMPLANT

## 2016-12-04 NOTE — Interval H&P Note (Signed)
Cath Lab Visit (complete for each Cath Lab visit)  Clinical Evaluation Leading to the Procedure:   ACS: No.  Non-ACS:    Anginal Classification: CCS III  Anti-ischemic medical therapy: Maximal Therapy (2 or more classes of medications)  Non-Invasive Test Results: No non-invasive testing performed  Prior CABG: Previous CABG      History and Physical Interval Note:  12/04/2016 10:42 AM  Scott [name]  has presented today for surgery, with the diagnosis of LT Cath w radial access    Chest pain  The various methods of treatment have been discussed with the patient and family. After consideration of risks, benefits and other options for treatment, the patient has consented to  Procedure(s): LEFT HEART CATH AND CORONARY ANGIOGRAPHY (N/A) as a surgical intervention .  The patient's history has been reviewed, patient examined, no change in status, stable for surgery.  I have reviewed the patient's chart and labs.  Questions were answered to the patient's satisfaction.     Kathlyn [place]

## 2016-12-04 NOTE — Telephone Encounter (Signed)
Schd MD & Venofer for this week, per 12/04/16 schd msg/Julie/Dr Tasia Catchings.  Sent staff msg to Julie/Dr Tasia Catchings as per not being able to reach patient to confirm appt date and time.

## 2016-12-04 NOTE — Progress Notes (Signed)
Patient here today for heart cath per Dr Fletcher Anon, noting during admission process, developed chest pain, 6/10, midsternal, vitals stable at that time. Placed on 2lmin./ oxygen, informed Dr Fletcher Anon as well as ekg stat obtained, given sl ntg, and repeated second dose for 4/10, resolved prior to patient going to procedure. Dr Fletcher Anon out to see pt prior to going to cath lab.

## 2016-12-04 NOTE — Discharge Instructions (Signed)
Moderate Conscious Sedation, Adult, Care After These instructions provide you with information about caring for yourself after your procedure. Your health care provider may also give you more specific instructions. Your treatment has been planned according to current medical practices, but problems sometimes occur. Call your health care provider if you have any problems or questions after your procedure. What can I expect after the procedure? After your procedure, it is common:  To feel sleepy for several hours.  To feel clumsy and have poor balance for several hours.  To have poor judgment for several hours.  To vomit if you eat too soon.  Follow these instructions at home: For at least 24 hours after the procedure:   Do not: ? Participate in activities where you could fall or become injured. ? Drive. ? Use heavy machinery. ? Drink alcohol. ? Take sleeping pills or medicines that cause drowsiness. ? Make important decisions or sign legal documents. ? Take care of children on your own.  Rest. Eating and drinking  Follow the diet recommended by your health care provider.  If you vomit: ? Drink water, juice, or soup when you can drink without vomiting. ? Make sure you have little or no nausea before eating solid foods. General instructions  Have a responsible adult stay with you until you are awake and alert.  Take over-the-counter and prescription medicines only as told by your health care provider.  If you smoke, do not smoke without supervision.  Keep all follow-up visits as told by your health care provider. This is important. Contact a health care provider if:  You keep feeling nauseous or you keep vomiting.  You feel light-headed.  You develop a rash.  You have a fever. Get help right away if:  You have trouble breathing. This information is not intended to replace advice given to you by your health care provider. Make sure you discuss any questions you have  with your health care provider. Document Released: 10/30/2012 Document Revised: 06/14/2015 Document Reviewed: 05/01/2015 Elsevier Interactive Patient Education  2018 [place] Refer to this sheet in the next few weeks. These instructions provide you with information about caring for yourself after your procedure. Your health care provider may also give you more specific instructions. Your treatment has been planned according to current medical practices, but problems sometimes occur. Call your health care provider if you have any problems or questions after your procedure. What can I expect after the procedure? After your procedure, it is typical to have the following:  Bruising at the radial site that usually fades within 1-2 weeks.  Blood collecting in the tissue (hematoma) that may be painful to the touch. It should usually decrease in size and tenderness within 1-2 weeks.  Follow these instructions at home:  Take medicines only as directed by your health care provider.  You may shower 24-48 hours after the procedure or as directed by your health care provider. Remove the bandage (dressing) and gently wash the site with plain soap and water. Pat the area dry with a clean towel. Do not rub the site, because this may cause bleeding.  Do not take baths, swim, or use a hot tub until your health care provider approves.  Check your insertion site every day for redness, swelling, or drainage.  Do not apply powder or lotion to the site.  Do not flex or bend the affected arm for 24 hours or as directed by your health care provider.  Do not  push or pull heavy objects with the affected arm for 24 hours or as directed by your health care provider.  Do not lift over 10 lb (4.5 kg) for 5 days after your procedure or as directed by your health care provider.  Ask your health care provider when it is okay to: ? Return to work or school. ? Resume usual physical activities or  sports. ? Resume sexual activity.  Do not drive home if you are discharged the same day as the procedure. Have someone else drive you.  You may drive 24 hours after the procedure unless otherwise instructed by your health care provider.  Do not operate machinery or power tools for 24 hours after the procedure.  If your procedure was done as an outpatient procedure, which means that you went home the same day as your procedure, a responsible adult should be with you for the first 24 hours after you arrive home.  Keep all follow-up visits as directed by your health care provider. This is important. Contact a health care provider if:  You have a fever.  You have chills.  You have increased bleeding from the radial site. Hold pressure on the site. Get help right away if:  You have unusual pain at the radial site.  You have redness, warmth, or swelling at the radial site.  You have drainage (other than a small amount of blood on the dressing) from the radial site.  The radial site is bleeding, and the bleeding does not stop after 30 minutes of holding steady pressure on the site.  Your arm or hand becomes pale, cool, tingly, or numb. This information is not intended to replace advice given to you by your health care provider. Make sure you discuss any questions you have with your health care provider. Document Released: 02/11/2010 Document Revised: 06/17/2015 Document Reviewed: 07/28/2013 Elsevier Interactive Patient Education  2018 [place] tomorrow

## 2016-12-04 NOTE — Telephone Encounter (Signed)
Left message on patient's home phone with appt info.

## 2016-12-04 NOTE — Telephone Encounter (Addendum)
Patient contacted regarding discharge from [facility] on 12/04/16.  Patient understands to follow up with provider Ignacia Bayley NP on 12/19/16 at 2:00 PM at [facility]. Patient understands discharge instructions? Yes Patient understands medications and regiment? Yes Patient understands to bring all medications to this visit? Yes  Reviewed information with patients wife per release form and she verbalized understanding with no further questions.

## 2016-12-04 NOTE — Telephone Encounter (Signed)
TCM....  Patient is being discharged   They saw Dr. Fletcher Anon - est. with [facility]  They are scheduled to see Murray Hodgkins on 11/27  They were seen for unstable angina  They need to be seen within 2 weeks (pt requested 27th or 28th)  Pt wait list not needed  Please call

## 2016-12-08 ENCOUNTER — Inpatient Hospital Stay: Payer: Medicare HMO

## 2016-12-08 ENCOUNTER — Inpatient Hospital Stay: Payer: Medicare HMO | Admitting: Oncology

## 2016-12-08 ENCOUNTER — Other Ambulatory Visit: Payer: Self-pay

## 2016-12-08 ENCOUNTER — Encounter: Payer: Self-pay | Admitting: Oncology

## 2016-12-08 VITALS — BP 112/67 | HR 78 | Temp 96.9°F | Wt 165.0 lb

## 2016-12-08 DIAGNOSIS — I509 Heart failure, unspecified: Secondary | ICD-10-CM | POA: Diagnosis not present

## 2016-12-08 DIAGNOSIS — Z79899 Other long term (current) drug therapy: Secondary | ICD-10-CM

## 2016-12-08 DIAGNOSIS — D509 Iron deficiency anemia, unspecified: Secondary | ICD-10-CM | POA: Diagnosis not present

## 2016-12-08 DIAGNOSIS — M1990 Unspecified osteoarthritis, unspecified site: Secondary | ICD-10-CM | POA: Diagnosis not present

## 2016-12-08 DIAGNOSIS — Z7901 Long term (current) use of anticoagulants: Secondary | ICD-10-CM | POA: Diagnosis not present

## 2016-12-08 DIAGNOSIS — I10 Essential (primary) hypertension: Secondary | ICD-10-CM | POA: Diagnosis not present

## 2016-12-08 DIAGNOSIS — I2510 Atherosclerotic heart disease of native coronary artery without angina pectoris: Secondary | ICD-10-CM | POA: Diagnosis not present

## 2016-12-08 DIAGNOSIS — I2699 Other pulmonary embolism without acute cor pulmonale: Secondary | ICD-10-CM | POA: Diagnosis not present

## 2016-12-08 DIAGNOSIS — D500 Iron deficiency anemia secondary to blood loss (chronic): Secondary | ICD-10-CM

## 2016-12-08 DIAGNOSIS — I4891 Unspecified atrial fibrillation: Secondary | ICD-10-CM

## 2016-12-08 DIAGNOSIS — I255 Ischemic cardiomyopathy: Secondary | ICD-10-CM | POA: Diagnosis not present

## 2016-12-08 LAB — URINALYSIS, COMPLETE (UACMP) WITH MICROSCOPIC
Bacteria, UA: NONE SEEN
Bilirubin Urine: NEGATIVE
GLUCOSE, UA: NEGATIVE mg/dL
Ketones, ur: NEGATIVE mg/dL
LEUKOCYTES UA: NEGATIVE
NITRITE: NEGATIVE
PROTEIN: NEGATIVE mg/dL
Specific Gravity, Urine: 1.015 (ref 1.005–1.030)
Squamous Epithelial / LPF: NONE SEEN
pH: 6 (ref 5.0–8.0)

## 2016-12-08 MED ORDER — SODIUM CHLORIDE 0.9 % IV SOLN
Freq: Once | INTRAVENOUS | Status: AC
  Administered 2016-12-08: 10:00:00 via INTRAVENOUS
  Filled 2016-12-08: qty 1000

## 2016-12-08 MED ORDER — IRON SUCROSE 20 MG/ML IV SOLN
200.0000 mg | Freq: Once | INTRAVENOUS | Status: AC
  Administered 2016-12-08: 200 mg via INTRAVENOUS
  Filled 2016-12-08: qty 10

## 2016-12-08 NOTE — Progress Notes (Signed)
[facility]  Follow Up Visit:  Patient Care Team: Baxter Hire, MD as PCP - General (Internal Medicine) Madelyn Brunner, MD (Internal Medicine) Alisa Graff, FNP as Nurse Practitioner (Cardiology) Minna Merritts, MD as Consulting Physician (Cardiology)  REASON FOR VISIT Follow up for treatment of iron deficiency anemia  HISTORY OF PRESENTING ILLNESS: Scott [name] 76 y.o. male with PMH listed as below who is referred to me for evaluation and management of pulmonary embolism.  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 Perioperatively his anticoagulation with Eliquis was discontinue a few days prior surgery and resumed after surgery. Operation note mentioned that during the procedure, the left lung was densely adherent to the underside of the sternum and tear in the lung did occur and was sutured. He presents  to [facility] on 08/04/2016 for evaluation of SOB and was found to have a PE. Patient states that he was compliant with Eliquis.  CTA chest showed left upper lobe PE extending into the left main pulmonary artery. His Eliquis was changed to Xarelto. Lower extremity dopplers were negative for DVT. Hypercoagulable workup was sent and all tests were negative except a mild low protein S function.  No formal cancer workup.  Sed rate normal, heavy metals negative, TSH mildly reduced at 0.253. 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     Hypercoagulable work up: discussed with patient. He is tested negative for factor 5 leiden mutation, prothrombin mutation, anti  phospholipin antibody panels. Normal protein C activity. Normal total protein S, mildly low protein S function, can be falsely low in the acute setting of thrombosis.Congential protein S deficiency is unlikely given that he has no prior history of VTE.  INTERVAL HISTORY Patient reports feeling very fatigue and shortness of breath.  He is on Xarelto. He has chronic intermittent chest tightness and chest pressure, improves with nitro., and recently had cardiac catheterization. Denies blood stool or enema. He can not tolerate oral iron supplementation. He has received Faraheme x1 and had chest tightness at the end of infusion. He received bendryla and steroids. He was sent to ER for further evaluation but feels better in ER.    Review of Systems  Constitutional: Positive for fatigue.  HENT:   Negative for mouth sores.   Eyes: Negative for icterus.  Respiratory: Positive for shortness of breath.        SOB with exertion.   Cardiovascular: Negative for chest pain.       Chronic Intermittent chest pain.  Gastrointestinal: Negative for abdominal pain.  Endocrine: Negative for hot flashes.  Genitourinary: Negative for dysuria.   Musculoskeletal: Negative for arthralgias.  Skin: Negative for itching.  Neurological: Negative for dizziness.  Hematological: Negative for adenopathy.  Psychiatric/Behavioral: The patient is not nervous/anxious.     MEDICAL HISTORY: Past Medical History:  Diagnosis Date  . Arthritis    right hip, since a fall  . CAD (coronary artery disease)    a. 04/2012 Cath: 3VD->Med Rx;  b. 04/2013 PCI RCA (2 DES); c. 03/2014 PCI: LAD 80 (3.0x23 Xience  [place] DES), 30 ISR in RCA; d. 06/2014 CABG x 2 ([facility]) LIMA->LAD, VG->OM; e. 11/2014 Neg MV;  f. 12/2014 Cath ([facility]): patent grafts->Med Rx; g. 03/2015 MV ([facility]) low risk, EF 40%; h. 05/2015 MV: low risk; i. 08/2015 Cath: patent grafts, patent RCA/LAD stents, small LCX/OM->Med Rx.  . Cardiomyopathy, ischemic    a. 04/2012 Echo: EF 40-45%;   b. 08/2013 Echo: EF 45-50%, mild glob HK, mod lat/post HK, diast dysfxn, mildly dil LA, mild MR, nl RVSP; c. 01/2015 Echo: EF 40-45%, Gr 1 DD, mildly dil LA.  . Carotid arterial disease ([place])    a. 05/2013 Carotid U/S; bilat 40-50% ICA stenosis.  . Chronic Chest Pain   . Chronic systolic CHF (congestive heart failure) ([place])    a. 01/2015 Echo: EF 40-45%.  . Cough   . Dyspnea    pt. unsure of exertion cause- that creates SOB at times   . Headache 1970's   migraine  . History of blood transfusion 2016   post op  . Hyperlipidemia   . Hypertension   . Iron deficiency anemia due to chronic blood loss 09/11/2016  . Myocardial infarction [facility])    unsure of when  . Pulmonary emboli ([place]) 07/2016    SURGICAL HISTORY: Past Surgical History:  Procedure Laterality Date  . CARDIAC CATHETERIZATION  05/01/2012  . CARDIAC CATHETERIZATION  04/2013   [facility];x3 stent  . CARDIAC CATHETERIZATION  01/11/15    [facility]  . CARDIAC CATHETERIZATION N/A 09/16/2015   Procedure: LEFT HEART CATH AND CORS/GRAFTS ANGIOGRAPHY;  Surgeon: Minna Merritts, MD;  Location: [place] [facility];  Service: Cardiovascular;  Laterality: N/A;  . CARPAL TUNNEL RELEASE     right hand  . CATARACT EXTRACTION     LEFT  . CATARACT EXTRACTION W/PHACO Left 09/16/2014   Procedure: CATARACT EXTRACTION PHACO AND INTRAOCULAR LENS PLACEMENT (IOC);  Surgeon: Leandrew Koyanagi, MD;  Location: [place];  Service: Ophthalmology;  Laterality: Left;  . COLONOSCOPY    . COLONOSCOPY WITH PROPOFOL N/A 01/28/2016   Procedure: COLONOSCOPY WITH PROPOFOL;  Surgeon: Lollie Sails, MD;  Location: [facility] ENDOSCOPY;  Service: Endoscopy;  Laterality: N/A;  . CORONARY ANGIOPLASTY WITH STENT PLACEMENT  04/13/2014  . CORONARY ARTERY BYPASS GRAFT  06-26-14   x3 bypasses  . DE QUERVAIN'S RELEASE Left 08/22/2012  . ESOPHAGOGASTRODUODENOSCOPY (EGD) WITH PROPOFOL N/A 04/26/2015   Procedure: ESOPHAGOGASTRODUODENOSCOPY (EGD) WITH PROPOFOL;  Surgeon: Hulen Luster,  MD;  Location: [facility] ENDOSCOPY;  Service: Gastroenterology;  Laterality: N/A;  . LEFT HEART CATH AND CORONARY ANGIOGRAPHY N/A 12/04/2016   Procedure: LEFT HEART CATH AND CORS/GRAFTS  ANGIOGRAPHY;  Surgeon: Wellington Hampshire, MD;  Location: [place] [facility];  Service: Cardiovascular;  Laterality: N/A;  . RIB PLATING N/A 05/25/2016   Procedure: STERNAL PLATING;  Surgeon: Melrose Nakayama, MD;  Location: [place];  Service: Thoracic;  Laterality: N/A;  . right shoulder    . STERNAL WIRES REMOVAL N/A 03/30/2016   Procedure: STERNAL WIRES REMOVAL;  Surgeon: Melrose Nakayama, MD;  Location: [place];  Service: Thoracic;  Laterality: N/A;  . TONSILLECTOMY      SOCIAL HISTORY: Social History   Socioeconomic History  . Marital status: Married    Spouse name: Not on file  . Number of children: Not on file  . Years of education: Not on file  . Highest education level: Not on file  Social Needs  . Financial resource strain: Not on file  . Food insecurity - worry: Not  on file  . Food insecurity - inability: Not on file  . Transportation needs - medical: Not on file  . Transportation needs - non-medical: Not on file  Occupational History  . Not on file  Tobacco Use  . Smoking status: Former Smoker    Packs/day: 1.00    Years: 35.00    Pack years: 35.00    Types: Cigarettes    Start date: 05/02/2009  . Smokeless tobacco: Never Used  . Tobacco comment: quit in 2006  Substance and Sexual Activity  . Alcohol use: No  . Drug use: No  . Sexual activity: Yes  Other Topics Concern  . Not on file  Social History Narrative  . Not on file    FAMILY HISTORY Family History  Problem Relation Age of Onset  . Heart attack Father 61       MI  . Cancer Maternal Uncle     ALLERGIES:  is allergic to contrast media [iodinated diagnostic agents]; oxycodone hcl; and vicodin [hydrocodone-acetaminophen].  MEDICATIONS:  Current Outpatient Medications  Medication Sig Dispense Refill  .  atorvastatin (LIPITOR) 80 MG tablet Take 80 mg every evening by mouth.     . carvedilol (COREG) 3.125 MG tablet Take 1 tablet (3.125 mg total) by mouth daily before breakfast. 90 tablet 3  . diphenhydramine-acetaminophen (TYLENOL PM) 25-500 MG TABS tablet Take 2 tablets by mouth at bedtime as needed. For pain/sleep    . isosorbide mononitrate (IMDUR) 60 MG 24 hr tablet Take 60 mg by mouth daily before breakfast.     . nitroGLYCERIN (NITROSTAT) 0.4 MG SL tablet Place 0.4 mg under the tongue every 5 (five) minutes as needed for chest pain.    . pantoprazole (PROTONIX) 40 MG tablet Take 1 tablet (40 mg total) by mouth daily. (Patient not taking: Reported on 11/27/2016) 30 tablet 0  . ramipril (ALTACE) 2.5 MG capsule Take 2.5 mg by mouth daily.    . sertraline (ZOLOFT) 25 MG tablet Take 25 mg daily by mouth.    . sertraline (ZOLOFT) 50 MG tablet Take 1 tablet (50 mg total) by mouth daily. (Patient not taking: Reported on 11/27/2016) 30 tablet 0  . traMADol (ULTRAM) 50 MG tablet Take 1-2 tablets (50-100 mg total) by mouth every 6 (six) hours as needed for moderate pain. (Patient taking differently: Take 50 mg at bedtime as needed by mouth for moderate pain. ) 30 tablet 0  . XARELTO 20 MG TABS tablet Take 1 tablet (20 mg total) by mouth daily. 90 tablet 2  . zolpidem (AMBIEN) 5 MG tablet Take 5 mg by mouth at bedtime as needed for sleep.      No current facility-administered medications for this visit.     PHYSICAL EXAMINATION:  ECOG PERFORMANCE STATUS: 1 - Symptomatic but completely ambulatory   Vitals:   12/08/16 0909  BP: 112/67  Pulse: 78  Temp: (!) 96.9 F (36.1 C)    Filed Weights   12/08/16 0909  Weight: 165 lb (74.8 kg)     Physical Exam GENERAL: No distress, well nourished.  SKIN:  No rashes or significant lesions  HEAD: Normocephalic, No masses, lesions, tenderness or abnormalities  EYES: Conjunctiva are pale, non icteric ENT: External ears normal ,lips , buccal mucosa, and  tongue normal and mucous membranes are moist  LYMPH: No palpable cervical and axillary lymphadenopathy  LUNGS: Clear to auscultation, no crackles or wheezes HEART: Regular rate & rhythm, no murmurs, no gallops, S1 normal and S2 normal  ABDOMEN:  Abdomen soft, non-tender, normal bowel sounds,  MUSCULOSKELETAL: No CVA tenderness and no tenderness on percussion of the back or rib cage.  EXTREMITIES: No edema, no skin discoloration or tenderness NEURO: Alert & oriented, no focal motor/sensory deficits.    LABORATORY DATA: I have personally reviewed the data as listed:  Appointment on 11/30/2016  Component Date Value Ref Range Status  . Iron 11/30/2016 45  45 - 182 ug/dL Final  . TIBC 11/30/2016 330  250 - 450 ug/dL Final  . Saturation Ratios 11/30/2016 14* 17.9 - 39.5 % Final  . UIBC 11/30/2016 285  ug/dL Final  . WBC 11/30/2016 5.6  3.8 - 10.6 K/uL Final  . RBC 11/30/2016 5.00  4.40 - 5.90 MIL/uL Final  . Hemoglobin 11/30/2016 14.3  13.0 - 18.0 g/dL Final  . HCT 11/30/2016 42.9  40.0 - 52.0 % Final  . MCV 11/30/2016 85.8  80.0 - 100.0 fL Final  . MCH 11/30/2016 28.6  26.0 - 34.0 pg Final  . MCHC 11/30/2016 33.3  32.0 - 36.0 g/dL Final  . RDW 11/30/2016 22.8* 11.5 - 14.5 % Final  . Platelets 11/30/2016 196  150 - 440 K/uL Final  . Neutrophils Relative % 11/30/2016 63  % Final  . Neutro Abs 11/30/2016 3.5  1.4 - 6.5 K/uL Final  . Lymphocytes Relative 11/30/2016 27  % Final  . Lymphs Abs 11/30/2016 1.5  1.0 - 3.6 K/uL Final  . Monocytes Relative 11/30/2016 9  % Final  . Monocytes Absolute 11/30/2016 0.5  0.2 - 1.0 K/uL Final  . Eosinophils Relative 11/30/2016 0  % Final  . Eosinophils Absolute 11/30/2016 0.0  0 - 0.7 K/uL Final  . Basophils Relative 11/30/2016 1  % Final  . Basophils Absolute 11/30/2016 0.0  0 - 0.1 K/uL Final  . Ferritin 11/30/2016 15* 24 - 336 ng/mL Final  . Protein S Ag, Total 11/30/2016 72  60 - 150 % Final   Comment: (NOTE) This test was developed and its  performance characteristics determined by [facility]. It has not been cleared or approved by the Food and Drug Administration.   . Protein S Ag, Free 11/30/2016 57  57 - 157 % Final   Comment: (NOTE) This test was developed and its performance characteristics determined by [facility]. It has not been cleared or approved by the Food and Drug Administration. Performed At: [facility] [place], [place] [id number] Rush Farmer MD [id number]   Admission on 11/19/2016, Discharged on 11/21/2016  Component Date Value Ref Range Status  . Sodium 11/19/2016 137  135 - 145 mmol/L Final  . Potassium 11/19/2016 4.5  3.5 - 5.1 mmol/L Final  . Chloride 11/19/2016 101  101 - 111 mmol/L Final  . CO2 11/19/2016 28  22 - 32 mmol/L Final  . Glucose, Bld 11/19/2016 111* 65 - 99 mg/dL Final  . BUN 11/19/2016 23* 6 - 20 mg/dL Final  . Creatinine, Ser 11/19/2016 1.40* 0.61 - 1.24 mg/dL Final  . Calcium 11/19/2016 9.3  8.9 - 10.3 mg/dL Final  . Total Protein 11/19/2016 7.7  6.5 - 8.1 g/dL Final  . Albumin 11/19/2016 3.8  3.5 - 5.0 g/dL Final  . AST 11/19/2016 23  15 - 41 U/L Final  . ALT 11/19/2016 20  17 - 63 U/L Final  . Alkaline Phosphatase 11/19/2016 44  38 - 126 U/L Final  . Total Bilirubin 11/19/2016 0.7  0.3 - 1.2 mg/dL Final  . GFR calc non Af Amer 11/19/2016 48* >60  mL/min Final  . GFR calc Af Amer 11/19/2016 56* >60 mL/min Final   Comment: (NOTE) The eGFR has been calculated using the CKD EPI equation. This calculation has not been validated in all clinical situations. eGFR's persistently <60 mL/min signify possible Chronic Kidney Disease.   . Anion gap 11/19/2016 8  5 - 15 Final  . WBC 11/19/2016 6.2  3.8 - 10.6 K/uL Final  . RBC 11/19/2016 5.54  4.40 - 5.90 MIL/uL Final  . Hemoglobin 11/19/2016 15.3  13.0 - 18.0 g/dL Final  . HCT 11/19/2016 46.4  40.0 - 52.0 % Final  . MCV 11/19/2016 83.6  80.0 - 100.0 fL Final  . MCH 11/19/2016 27.6  26.0 - 34.0 pg Final  . MCHC  11/19/2016 33.0  32.0 - 36.0 g/dL Final  . RDW 11/19/2016 23.8* 11.5 - 14.5 % Final  . Platelets 11/19/2016 263  150 - 440 K/uL Final  . Neutrophils Relative % 11/19/2016 60  % Final  . Neutro Abs 11/19/2016 3.7  1.4 - 6.5 K/uL Final  . Lymphocytes Relative 11/19/2016 30  % Final  . Lymphs Abs 11/19/2016 1.9  1.0 - 3.6 K/uL Final  . Monocytes Relative 11/19/2016 8  % Final  . Monocytes Absolute 11/19/2016 0.5  0.2 - 1.0 K/uL Final  . Eosinophils Relative 11/19/2016 1  % Final  . Eosinophils Absolute 11/19/2016 0.1  0 - 0.7 K/uL Final  . Basophils Relative 11/19/2016 1  % Final  . Basophils Absolute 11/19/2016 0.0  0 - 0.1 K/uL Final  . Prothrombin Time 11/19/2016 34.5* 11.4 - 15.2 seconds Final  . INR 11/19/2016 3.45   Final  . Color, Urine 11/19/2016 AMBER* YELLOW Final   BIOCHEMICALS MAY BE AFFECTED BY COLOR  . APPearance 11/19/2016 CLEAR* CLEAR Final  . Specific Gravity, Urine 11/19/2016 1.028  1.005 - 1.030 Final  . pH 11/19/2016 6.0  5.0 - 8.0 Final  . Glucose, UA 11/19/2016 NEGATIVE  NEGATIVE mg/dL Final  . Hgb urine dipstick 11/19/2016 NEGATIVE  NEGATIVE Final  . Bilirubin Urine 11/19/2016 NEGATIVE  NEGATIVE Final  . Ketones, ur 11/19/2016 NEGATIVE  NEGATIVE mg/dL Final  . Protein, ur 11/19/2016 30* NEGATIVE mg/dL Final  . Nitrite 11/19/2016 NEGATIVE  NEGATIVE Final  . Leukocytes, UA 11/19/2016 NEGATIVE  NEGATIVE Final  . RBC / HPF 11/19/2016 6-30  0 - 5 RBC/hpf Final  . WBC, UA 11/19/2016 0-5  0 - 5 WBC/hpf Final  . Bacteria, UA 11/19/2016 NONE SEEN  NONE SEEN Final  . Squamous Epithelial / LPF 11/19/2016 0-5* NONE SEEN Final  . Mucus 11/19/2016 PRESENT   Final  . Hyaline Casts, UA 11/19/2016 PRESENT   Final  . Granular Casts, UA 11/19/2016 PRESENT   Final  . Amorphous Crystal 11/19/2016 PRESENT   Final  . Troponin I 11/19/2016 <0.03  <0.03 ng/mL Final  . Total CK 11/19/2016 64  49 - 397 U/L Final  . TSH 11/19/2016 3.714  0.350 - 4.500 uIU/mL Final   Performed by a 3rd  Generation assay with a functional sensitivity of <=0.01 uIU/mL.  . WBC 11/20/2016 4.8  3.8 - 10.6 K/uL Final  . RBC 11/20/2016 5.00  4.40 - 5.90 MIL/uL Final  . Hemoglobin 11/20/2016 14.1  13.0 - 18.0 g/dL Final  . HCT 11/20/2016 41.6  40.0 - 52.0 % Final  . MCV 11/20/2016 83.2  80.0 - 100.0 fL Final  . MCH 11/20/2016 28.2  26.0 - 34.0 pg Final  . MCHC 11/20/2016 33.9  32.0 - 36.0  g/dL Final  . RDW 11/20/2016 24.1* 11.5 - 14.5 % Final  . Platelets 11/20/2016 208  150 - 440 K/uL Final  . Sodium 11/20/2016 139  135 - 145 mmol/L Final  . Potassium 11/20/2016 4.1  3.5 - 5.1 mmol/L Final  . Chloride 11/20/2016 108  101 - 111 mmol/L Final  . CO2 11/20/2016 28  22 - 32 mmol/L Final  . Glucose, Bld 11/20/2016 92  65 - 99 mg/dL Final  . BUN 11/20/2016 16  6 - 20 mg/dL Final  . Creatinine, Ser 11/20/2016 1.21  0.61 - 1.24 mg/dL Final  . Calcium 11/20/2016 8.5* 8.9 - 10.3 mg/dL Final  . Total Protein 11/20/2016 6.2* 6.5 - 8.1 g/dL Final  . Albumin 11/20/2016 3.2* 3.5 - 5.0 g/dL Final  . AST 11/20/2016 22  15 - 41 U/L Final  . ALT 11/20/2016 17  17 - 63 U/L Final  . Alkaline Phosphatase 11/20/2016 40  38 - 126 U/L Final  . Total Bilirubin 11/20/2016 0.6  0.3 - 1.2 mg/dL Final  . GFR calc non Af Amer 11/20/2016 57* >60 mL/min Final  . GFR calc Af Amer 11/20/2016 >60  >60 mL/min Final   Comment: (NOTE) The eGFR has been calculated using the CKD EPI equation. This calculation has not been validated in all clinical situations. eGFR's persistently <60 mL/min signify possible Chronic Kidney Disease.   . Anion gap 11/20/2016 3* 5 - 15 Final  . Glucose-Capillary 11/21/2016 102* 65 - 99 mg/dL Final  Admission on 11/07/2016, Discharged on 11/08/2016  Component Date Value Ref Range Status  . Sodium 11/07/2016 136  135 - 145 mmol/L Final  . Potassium 11/07/2016 3.9  3.5 - 5.1 mmol/L Final  . Chloride 11/07/2016 102  101 - 111 mmol/L Final  . CO2 11/07/2016 25  22 - 32 mmol/L Final  . Glucose, Bld  11/07/2016 98  65 - 99 mg/dL Final  . BUN 11/07/2016 22* 6 - 20 mg/dL Final  . Creatinine, Ser 11/07/2016 1.04  0.61 - 1.24 mg/dL Final  . Calcium 11/07/2016 9.2  8.9 - 10.3 mg/dL Final  . GFR calc non Af Amer 11/07/2016 >60  >60 mL/min Final  . GFR calc Af Amer 11/07/2016 >60  >60 mL/min Final   Comment: (NOTE) The eGFR has been calculated using the CKD EPI equation. This calculation has not been validated in all clinical situations. eGFR's persistently <60 mL/min signify possible Chronic Kidney Disease.   . Anion gap 11/07/2016 9  5 - 15 Final  . WBC 11/07/2016 4.6  3.8 - 10.6 K/uL Final  . RBC 11/07/2016 5.01  4.40 - 5.90 MIL/uL Final  . Hemoglobin 11/07/2016 14.0  13.0 - 18.0 g/dL Final  . HCT 11/07/2016 41.0  40.0 - 52.0 % Final  . MCV 11/07/2016 81.9  80.0 - 100.0 fL Final  . MCH 11/07/2016 28.0  26.0 - 34.0 pg Final  . MCHC 11/07/2016 34.2  32.0 - 36.0 g/dL Final  . RDW 11/07/2016 24.2* 11.5 - 14.5 % Final  . Platelets 11/07/2016 194  150 - 440 K/uL Final  . Troponin I 11/07/2016 <0.03  <0.03 ng/mL Final  . aPTT 11/07/2016 44* 24 - 36 seconds Final   Comment:        IF BASELINE aPTT IS ELEVATED, SUGGEST PATIENT RISK ASSESSMENT BE USED TO DETERMINE APPROPRIATE ANTICOAGULANT THERAPY.   . Prothrombin Time 11/07/2016 27.7* 11.4 - 15.2 seconds Final  . INR 11/07/2016 2.61   Final  . Heparin Unfractionated 11/07/2016 >  3.60* 0.30 - 0.70 IU/mL Final   Comment:        IF HEPARIN RESULTS ARE BELOW EXPECTED VALUES, AND PATIENT DOSAGE HAS BEEN CONFIRMED, SUGGEST FOLLOW UP TESTING OF ANTITHROMBIN III LEVELS. RESULTS CONFIRMED BY MANUAL DILUTION   . Heparin Unfractionated 11/07/2016 >3.60* 0.30 - 0.70 IU/mL Final   Comment:        IF HEPARIN RESULTS ARE BELOW EXPECTED VALUES, AND PATIENT DOSAGE HAS BEEN CONFIRMED, SUGGEST FOLLOW UP TESTING OF ANTITHROMBIN III LEVELS. RESULTS CONFIRMED BY MANUAL DILUTION   . aPTT 11/07/2016 134* 24 - 36 seconds Final   Comment:        IF  BASELINE aPTT IS ELEVATED, SUGGEST PATIENT RISK ASSESSMENT BE USED TO DETERMINE APPROPRIATE ANTICOAGULANT THERAPY.   . Troponin I 11/07/2016 <0.03  <0.03 ng/mL Final  . Troponin I 11/07/2016 <0.03  <0.03 ng/mL Final  . Troponin I 11/07/2016 <0.03  <0.03 ng/mL Final  . Cholesterol 11/07/2016 103  0 - 200 mg/dL Final  . Triglycerides 11/07/2016 31  <150 mg/dL Final  . HDL 11/07/2016 41  >40 mg/dL Final  . Total CHOL/HDL Ratio 11/07/2016 2.5  RATIO Final  . VLDL 11/07/2016 6  0 - 40 mg/dL Final  . LDL Cholesterol 11/07/2016 56  0 - 99 mg/dL Final   Comment:        Total Cholesterol/HDL:CHD Risk Coronary Heart Disease Risk Table                     Men   Women  1/2 Average Risk   3.4   3.3  Average Risk       5.0   4.4  2 X Average Risk   9.6   7.1  3 X Average Risk  23.4   11.0        Use the calculated Patient Ratio above and the CHD Risk Table to determine the patient's CHD Risk.        ATP III CLASSIFICATION (LDL):  <100     mg/dL   Optimal  100-129  mg/dL   Near or Above                    Optimal  130-159  mg/dL   Borderline  160-189  mg/dL   High  >190     mg/dL   Very High   . Hgb A1c MFr Bld 11/07/2016 5.9* 4.8 - 5.6 % Final   Comment: (NOTE) Pre diabetes:          5.7%-6.4% Diabetes:              >6.4% Glycemic control for   <7.0% adults with diabetes   . Mean Plasma Glucose 11/07/2016 122.63  mg/dL Final   Performed at [facility] [address], [place], [place] [address]  . Glucose-Capillary 11/07/2016 128* 65 - 99 mg/dL Final  . MRSA by PCR 11/07/2016 NEGATIVE  NEGATIVE Final   Comment:        The GeneXpert MRSA Assay (FDA approved for NASAL specimens only), is one component of a comprehensive MRSA colonization surveillance program. It is not intended to diagnose MRSA infection nor to guide or monitor treatment for MRSA infections.   . Sodium 11/08/2016 135  135 - 145 mmol/L Final  . Potassium 11/08/2016 4.3  3.5 - 5.1 mmol/L Final  .  Chloride 11/08/2016 105  101 - 111 mmol/L Final  . CO2 11/08/2016 25  22 - 32 mmol/L Final  .  Glucose, Bld 11/08/2016 130* 65 - 99 mg/dL Final  . BUN 11/08/2016 23* 6 - 20 mg/dL Final  . Creatinine, Ser 11/08/2016 1.16  0.61 - 1.24 mg/dL Final  . Calcium 11/08/2016 8.7* 8.9 - 10.3 mg/dL Final  . GFR calc non Af Amer 11/08/2016 >60  >60 mL/min Final  . GFR calc Af Amer 11/08/2016 >60  >60 mL/min Final   Comment: (NOTE) The eGFR has been calculated using the CKD EPI equation. This calculation has not been validated in all clinical situations. eGFR's persistently <60 mL/min signify possible Chronic Kidney Disease.   . Anion gap 11/08/2016 5  5 - 15 Final  . WBC 11/08/2016 7.0  3.8 - 10.6 K/uL Final  . RBC 11/08/2016 4.89  4.40 - 5.90 MIL/uL Final  . Hemoglobin 11/08/2016 13.4  13.0 - 18.0 g/dL Final  . HCT 11/08/2016 40.4  40.0 - 52.0 % Final  . MCV 11/08/2016 82.7  80.0 - 100.0 fL Final  . MCH 11/08/2016 27.4  26.0 - 34.0 pg Final  . MCHC 11/08/2016 33.1  32.0 - 36.0 g/dL Final  . RDW 11/08/2016 23.9* 11.5 - 14.5 % Final  . Platelets 11/08/2016 214  150 - 440 K/uL Final  . Magnesium 11/07/2016 1.8  1.7 - 2.4 mg/dL Final  . Phosphorus 11/07/2016 2.5  2.5 - 4.6 mg/dL Final  . aPTT 11/08/2016 118* 24 - 36 seconds Final   Comment:        IF BASELINE aPTT IS ELEVATED, SUGGEST PATIENT RISK ASSESSMENT BE USED TO DETERMINE APPROPRIATE ANTICOAGULANT THERAPY.   Marland Kitchen Rest HR 11/08/2016 70  bpm Final  . Rest BP 11/08/2016 142/95  mmHg Final  . Percent HR 11/08/2016 55  % Final  . Peak HR 11/08/2016 81  bpm Final  . Peak BP 11/08/2016 128/72  mmHg Final  . SSS 11/08/2016 9   Final  . SRS 11/08/2016 4   Final  . LV sys vol 11/08/2016 50  mL Final  . LV dias vol 11/08/2016 112  62 - 150 mL Final   Lab Results  Component Value Date   IRON 45 11/30/2016   TIBC 330 11/30/2016   IRONPCTSAT 14 (L) 11/30/2016   Lab Results  Component Value Date   FERRITIN 15 (L) 11/30/2016      RADIOGRAPHIC STUDIES: I have personally reviewed the radiological images as listed and agree with the findings in the report 08/08/2016 1. 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 No CT evidence of right heart straining. 2. Multi vessel coronary vascular calcification and CABG surgery changes.  01/28/2016 colonoscopy showed diverticulosis of sigmoid colon and distant descending colon. 04/26/2015 EGD normal.  ASSESSMENT/PLAN 1. Iron deficiency anemia due to chronic blood loss   2. Chronic anticoagulation    #Plan IV iron with Venofer '200mg'$  weekly for 5 doses, first dose today. Allergy reactions/infusion reaction including anaphylactic reaction discussed with patient. Patient voices understanding and willing to proceed. # Follow up with me in 6 weeks with  CBC, iron TIBC, ferritin repeat prior to the visit.  # Check UA to r/o blood loss through GU tract.   Orders Placed This Encounter  Procedures  . CBC with Differential/Platelet    Standing Status:   Future    Standing Expiration Date:   12/08/2017  . Iron and TIBC    Standing Status:   Future    Standing Expiration Date:   12/08/2017  . Ferritin  Standing Status:   Future    Standing Expiration Date:   12/08/2017  . Urinalysis, Complete w Microscopic    Standing Status:   Future    Number of Occurrences:   1    Standing Expiration Date:   12/08/2017    All questions were answered. The patient knows to call the clinic with any problems, questions or concerns.    Earlie Server, MD  12/08/2016 8:31 AM

## 2016-12-08 NOTE — Progress Notes (Signed)
Patient here today for follow up and first iv venofer.

## 2016-12-11 ENCOUNTER — Inpatient Hospital Stay: Payer: Medicare HMO | Admitting: Oncology

## 2016-12-19 ENCOUNTER — Ambulatory Visit: Payer: Medicare HMO | Admitting: Nurse Practitioner

## 2016-12-19 ENCOUNTER — Encounter: Payer: Self-pay | Admitting: Nurse Practitioner

## 2016-12-19 VITALS — BP 110/60 | HR 61 | Ht 70.0 in | Wt 169.8 lb

## 2016-12-19 DIAGNOSIS — R0602 Shortness of breath: Secondary | ICD-10-CM | POA: Diagnosis not present

## 2016-12-19 DIAGNOSIS — E785 Hyperlipidemia, unspecified: Secondary | ICD-10-CM

## 2016-12-19 DIAGNOSIS — R079 Chest pain, unspecified: Secondary | ICD-10-CM

## 2016-12-19 DIAGNOSIS — I10 Essential (primary) hypertension: Secondary | ICD-10-CM

## 2016-12-19 DIAGNOSIS — I25119 Atherosclerotic heart disease of native coronary artery with unspecified angina pectoris: Secondary | ICD-10-CM

## 2016-12-19 NOTE — Patient Instructions (Signed)
Medication Instructions:  Please continue your current medications  Labwork: None  Testing/Procedures: None  Follow-Up: Your physician wants you to follow-up in: 6 months w/ Dr. Cathie Hoops will receive a reminder letter in the mail two months in advance. If you don't receive a letter, please call our office to schedule the follow-up appointment.  If you need a refill on your cardiac medications before your next appointment, please call your pharmacy.

## 2016-12-19 NOTE — Progress Notes (Signed)
Office Visit    Patient Name: Scott [name] Date of Encounter: 12/19/2016  Primary Care Provider:  Baxter Hire, MD Primary Cardiologist:  Johnny Bridge, MD   Chief Complaint    76 y/o ? with a history of CAD status post prior RCA and LAD stenting as well as CABG x2, chronic chest pain status post sternal wire removal followed by sternal debridement and plating, ischemic cardiomyopathy with recovery of LV function, carotid arterial disease, chronic dyspnea, hypertension, hyperlipidemia, iron deficiency anemia, and pulmonary embolism in July 2018, who presents for follow-up after recent hospitalizations.  Past Medical History    Past Medical History:  Diagnosis Date  . Arthritis    right hip, since a fall  . CAD (coronary artery disease)    a. 04/2012 Cath: 3VD->Med Rx;  b. 04/2013 PCI RCA (2 DES); c. 03/2014 PCI: LAD 80 (3.0x23 Xience Alpine DES), 30 ISR in RCA; d. 06/2014 CABG x 2 ([facility]) LIMA->LAD, VG->OM; e. 12/2014 Cath ([facility]): patent grafts->Med Rx; f. 08/2015 Cath: patent grafts; g. 11/2016 Cath: LM min irregs, LAD 20p, 35m, LCX 100p/m, RCA 20ost, 10p/m/d, VG->OM1 nl, LIMA->dLAD nl.  . Cardiomyopathy, ischemic    a. 04/2012 Echo: EF 40-45%;  b. 08/2013 Echo: EF 45-50%, mild glob HK, mod lat/post HK, diast dysfxn, mildly dil LA, mild MR, nl RVSP; c. 01/2015 Echo: EF 40-45%, Gr 1 DD, mildly dil LA; 07/2016 Echo: EF 60-65%, no rwma, mild MR, mildly dil LA.  . Carotid arterial disease ([place])    a. 05/2013 Carotid U/S; bilat 40-50% ICA stenosis.  . Chronic Chest Pain   . Chronic systolic CHF (congestive heart failure) ([place])    a. 01/2015 Echo: EF 40-45%; b. 07/2016 Echo: EF 60-65%.  . Cough   . Dyspnea    pt. unsure of exertion cause- that creates SOB at times   . Headache 1970's   migraine  . History of blood transfusion 2016   post op  . Hyperlipidemia   . Hypertension   . Iron deficiency anemia due to chronic blood loss 09/11/2016  . Myocardial infarction [facility])    unsure of when  .  Pulmonary emboli ([place]) 07/2016   a. On Xarelto.  . Sternal pain    a. 03/2016 s/p Sternal wire removal; b. 05/2016 s/p redo median sternotomy for sternal debridement and sternal plating.   Past Surgical History:  Procedure Laterality Date  . CARDIAC CATHETERIZATION  05/01/2012  . CARDIAC CATHETERIZATION  04/2013   [facility];x3 stent  . CARDIAC CATHETERIZATION  01/11/15    [facility]  . CARDIAC CATHETERIZATION N/A 09/16/2015   Procedure: LEFT HEART CATH AND CORS/GRAFTS ANGIOGRAPHY;  Surgeon: Minna Merritts, MD;  Location: [place] [facility];  Service: Cardiovascular;  Laterality: N/A;  . CARPAL TUNNEL RELEASE     right hand  . CATARACT EXTRACTION     LEFT  . CATARACT EXTRACTION W/PHACO Left 09/16/2014   Procedure: CATARACT EXTRACTION PHACO AND INTRAOCULAR LENS PLACEMENT (IOC);  Surgeon: Leandrew Koyanagi, MD;  Location: [place];  Service: Ophthalmology;  Laterality: Left;  . COLONOSCOPY    . COLONOSCOPY WITH PROPOFOL N/A 01/28/2016   Procedure: COLONOSCOPY WITH PROPOFOL;  Surgeon: Lollie Sails, MD;  Location: [facility] ENDOSCOPY;  Service: Endoscopy;  Laterality: N/A;  . CORONARY ANGIOPLASTY WITH STENT PLACEMENT  04/13/2014  . CORONARY ARTERY BYPASS GRAFT  06-26-14   x3 bypasses  . DE QUERVAIN'S RELEASE Left 08/22/2012  . ESOPHAGOGASTRODUODENOSCOPY (EGD) WITH PROPOFOL N/A 04/26/2015   Procedure: ESOPHAGOGASTRODUODENOSCOPY (  EGD) WITH PROPOFOL;  Surgeon: Hulen Luster, MD;  Location: [facility] ENDOSCOPY;  Service: Gastroenterology;  Laterality: N/A;  . LEFT HEART CATH AND CORONARY ANGIOGRAPHY N/A 12/04/2016   Procedure: LEFT HEART CATH AND CORS/GRAFTS  ANGIOGRAPHY;  Surgeon: Wellington Hampshire, MD;  Location: [place] [facility];  Service: Cardiovascular;  Laterality: N/A;  . RIB PLATING N/A 05/25/2016   Procedure: STERNAL PLATING;  Surgeon: Melrose Nakayama, MD;  Location: [place];  Service: Thoracic;  Laterality: N/A;  . right shoulder    . STERNAL WIRES REMOVAL N/A 03/30/2016   Procedure: STERNAL  WIRES REMOVAL;  Surgeon: Melrose Nakayama, MD;  Location: [place];  Service: Thoracic;  Laterality: N/A;  . TONSILLECTOMY      Allergies  Allergies  Allergen Reactions  . Contrast Media [Iodinated Diagnostic Agents] Hives  . Oxycodone Hcl Itching  . Vicodin [Hydrocodone-Acetaminophen] Itching    History of Present Illness    76 year old male with the above complex past medical history.  He is status post PCI of the right coronary artery in April 2015 with subsequent PCI of the LAD in March 2016.  He continued to have chest pain following procedures and subsequently underwent CABG x2 at [facility] in June 2016.  Since then, he has had chronic intermittent chest pain and also dyspnea on exertion with subsequent catheterizations in August 2017 and most recently in November 2018, revealing patent grafts and stable native disease.  In the setting of chronic chest pain, he was evaluated in pain clinic and subsequently was seen by CT surgery earlier this year and underwent removal of sternal wire in March.  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  This was followed by redo sternotomy with sternal debridement and plating.  Despite this, he continues to have chest pain.  In July of this year, he was admitted with chest pain and dyspnea was found to have a pulmonary embolism.  He has been on Xarelto since then.  As noted above, he has been hospitalized multiple times over the past few months with chest pain.  Stress testing was nonischemic in October but due to ongoing chest pain, he underwent catheterization in November which again showed 2 of 2 patent grafts, occluded left circumflex, and otherwise stable anatomy.  Since his hospitalization, he has continued to have intermittent chest discomfort and dyspnea on exertion.  He says he has used several nitroglycerin tablets.  Chest pain and dyspnea and do not always limit his activities and are similar to what is been  experiencing for several years now.  He denies PND, orthopnea, dizziness, syncope, edema, or early satiety.  Home Medications    Prior to Admission medications   Medication Sig Start Date End Date Taking? Authorizing Provider  atorvastatin (LIPITOR) 80 MG tablet Take 80 mg every evening by mouth.    Yes [provider]  carvedilol (COREG) 3.125 MG tablet Take 1 tablet (3.125 mg total) by mouth daily before breakfast. 05/15/16  Yes Gollan, [name], MD  diphenhydramine-acetaminophen (TYLENOL PM) 25-500 MG TABS tablet Take 2 tablets by mouth at bedtime as needed. For pain/sleep   Yes [provider]  isosorbide mononitrate (IMDUR) 60 MG 24 hr tablet Take 60 mg by mouth 2 (two) times daily.    Yes [provider]  nitroGLYCERIN (NITROSTAT) 0.4 MG SL tablet Place 0.4 mg under the tongue every 5 (five) minutes as needed for chest pain.   Yes [provider]  ramipril (ALTACE) 2.5 MG capsule Take 2.5 mg by mouth daily.   Yes [provider]  traMADol (ULTRAM) 50 MG tablet Take 1-2 tablets (50-100 mg total) by mouth every 6 (six) hours as needed for moderate pain. Patient taking differently: Take 50 mg at bedtime as needed by mouth for moderate pain.  06/30/16  Yes Melrose Nakayama, MD  XARELTO 20 MG TABS tablet Take 1 tablet (20 mg total) by mouth daily. 10/23/16  Yes Gollan, [name], MD  zolpidem (AMBIEN) 5 MG tablet Take 5 mg by mouth at bedtime as needed for sleep.    Yes [provider]    Review of Systems    Chest pain and dyspnea as outlined above.  He denies PND, orthopnea, palpitations, dizziness, syncope, edema, or early satiety.  All other systems reviewed and are otherwise negative except as noted above.  Physical Exam    VS:  BP 110/60 (BP Location: Left Arm, Patient Position: Sitting, Cuff Size: Normal)   Pulse 61   Ht 5\' 10"  (1.778 m)   Wt 169 lb 12 oz (77 kg)   BMI 24.36 kg/m  , BMI Body mass index is 24.36 kg/m. GEN:  Well nourished, well developed, in no acute distress.  HEENT: normal.  Neck: Supple, no JVD, carotid bruits, or masses. Cardiac: RRR, no murmurs, rubs, or gallops. No clubbing, cyanosis, edema.  Radials/DP/PT 2+ and equal bilaterally.  Respiratory:  Respirations regular and unlabored, clear to auscultation bilaterally. GI: Soft, nontender, nondistended, BS + x 4. MS: no deformity or atrophy. Skin: warm and dry, no rash. Neuro:  Strength and sensation are intact. Psych: Normal affect.  Accessory Clinical Findings    ECG -regular sinus rhythm, 61, left axis deviation, IVCD, prior inferior infarct, delayed R wave progression.  No acute changes.  Assessment & Plan    1.  Coronary artery disease/chronic chest pain: Patient has had several recent hospitalizations for chest pain with negative cardiac markers.  Negative Myoview in October with catheterization in November revealing 2 of 2 patent grafts with otherwise stable native anatomy.  He continues to have intermittent chest discomfort which based on his description is stable over several years.  He has been using as needed nitrates and I have recommended that he consider using as needed nitrates preemptively, prior to activities.  He otherwise remains on statin, nitrate, and ACE inhibitor therapy.  He is not currently on aspirin in the setting of ongoing Xarelto therapy for pulmonary embolism.  No further ischemic evaluation warranted at this time.  2.  Chronic dyspnea on exertion: Echocardiogram in July showed normal LV function.  Catheterization in November did not show any targets for intervention.  He has follow-up with pulmonology.  3.  Pulmonary embolism: Question role in dyspnea.  He remains on Xarelto therapy.  Recent CT in October did not show any evidence of PE.  4.  Essential hypertension: Stable  5.  Hyperlipidemia: LDL was 56 in October 2018.  Continue statin therapy.  6.  ? 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  He was seen by neurology.  EEG and MRI were normal.  He was not orthostatic.  Neurology felt that this most likely represent panic attacks.   7.  Disposition: We discussed his symptoms and case today for 35 minutes. Patient will follow-up in 6 months or sooner if necessary.  Murray Hodgkins, NP 12/19/2016, 5:10 PM

## 2016-12-22 ENCOUNTER — Inpatient Hospital Stay: Payer: Medicare HMO

## 2016-12-22 VITALS — BP 119/60 | HR 60 | Temp 96.2°F | Resp 18

## 2016-12-22 DIAGNOSIS — I509 Heart failure, unspecified: Secondary | ICD-10-CM | POA: Diagnosis not present

## 2016-12-22 DIAGNOSIS — D509 Iron deficiency anemia, unspecified: Secondary | ICD-10-CM | POA: Diagnosis not present

## 2016-12-22 DIAGNOSIS — I10 Essential (primary) hypertension: Secondary | ICD-10-CM | POA: Diagnosis not present

## 2016-12-22 DIAGNOSIS — I2699 Other pulmonary embolism without acute cor pulmonale: Secondary | ICD-10-CM | POA: Diagnosis not present

## 2016-12-22 DIAGNOSIS — I255 Ischemic cardiomyopathy: Secondary | ICD-10-CM | POA: Diagnosis not present

## 2016-12-22 DIAGNOSIS — I4891 Unspecified atrial fibrillation: Secondary | ICD-10-CM | POA: Diagnosis not present

## 2016-12-22 DIAGNOSIS — M1990 Unspecified osteoarthritis, unspecified site: Secondary | ICD-10-CM | POA: Diagnosis not present

## 2016-12-22 DIAGNOSIS — D500 Iron deficiency anemia secondary to blood loss (chronic): Secondary | ICD-10-CM

## 2016-12-22 DIAGNOSIS — Z7901 Long term (current) use of anticoagulants: Secondary | ICD-10-CM | POA: Diagnosis not present

## 2016-12-22 DIAGNOSIS — Z79899 Other long term (current) drug therapy: Secondary | ICD-10-CM | POA: Diagnosis not present

## 2016-12-22 DIAGNOSIS — I2510 Atherosclerotic heart disease of native coronary artery without angina pectoris: Secondary | ICD-10-CM | POA: Diagnosis not present

## 2016-12-22 MED ORDER — IRON SUCROSE 20 MG/ML IV SOLN
200.0000 mg | Freq: Once | INTRAVENOUS | Status: AC
  Administered 2016-12-22: 200 mg via INTRAVENOUS
  Filled 2016-12-22: qty 10

## 2016-12-22 MED ORDER — SODIUM CHLORIDE 0.9 % IV SOLN
INTRAVENOUS | Status: DC
  Administered 2016-12-22: 14:00:00 via INTRAVENOUS
  Filled 2016-12-22: qty 1000

## 2016-12-27 DIAGNOSIS — D509 Iron deficiency anemia, unspecified: Secondary | ICD-10-CM | POA: Diagnosis not present

## 2016-12-29 ENCOUNTER — Inpatient Hospital Stay: Payer: Medicare HMO | Attending: Oncology

## 2016-12-29 VITALS — BP 103/59 | HR 68 | Temp 96.8°F | Resp 20

## 2016-12-29 DIAGNOSIS — M1990 Unspecified osteoarthritis, unspecified site: Secondary | ICD-10-CM | POA: Diagnosis not present

## 2016-12-29 DIAGNOSIS — I2699 Other pulmonary embolism without acute cor pulmonale: Secondary | ICD-10-CM | POA: Insufficient documentation

## 2016-12-29 DIAGNOSIS — R69 Illness, unspecified: Secondary | ICD-10-CM | POA: Diagnosis not present

## 2016-12-29 DIAGNOSIS — D500 Iron deficiency anemia secondary to blood loss (chronic): Secondary | ICD-10-CM

## 2016-12-29 DIAGNOSIS — D509 Iron deficiency anemia, unspecified: Secondary | ICD-10-CM | POA: Insufficient documentation

## 2016-12-29 DIAGNOSIS — Z7901 Long term (current) use of anticoagulants: Secondary | ICD-10-CM | POA: Insufficient documentation

## 2016-12-29 DIAGNOSIS — Z79899 Other long term (current) drug therapy: Secondary | ICD-10-CM | POA: Diagnosis not present

## 2016-12-29 DIAGNOSIS — I4891 Unspecified atrial fibrillation: Secondary | ICD-10-CM | POA: Insufficient documentation

## 2016-12-29 DIAGNOSIS — I252 Old myocardial infarction: Secondary | ICD-10-CM | POA: Insufficient documentation

## 2016-12-29 DIAGNOSIS — I509 Heart failure, unspecified: Secondary | ICD-10-CM | POA: Diagnosis not present

## 2016-12-29 DIAGNOSIS — I255 Ischemic cardiomyopathy: Secondary | ICD-10-CM | POA: Diagnosis not present

## 2016-12-29 DIAGNOSIS — R0602 Shortness of breath: Secondary | ICD-10-CM | POA: Diagnosis not present

## 2016-12-29 DIAGNOSIS — I10 Essential (primary) hypertension: Secondary | ICD-10-CM | POA: Diagnosis not present

## 2016-12-29 DIAGNOSIS — E785 Hyperlipidemia, unspecified: Secondary | ICD-10-CM | POA: Insufficient documentation

## 2016-12-29 DIAGNOSIS — I2510 Atherosclerotic heart disease of native coronary artery without angina pectoris: Secondary | ICD-10-CM | POA: Insufficient documentation

## 2016-12-29 DIAGNOSIS — J439 Emphysema, unspecified: Secondary | ICD-10-CM | POA: Diagnosis not present

## 2016-12-29 DIAGNOSIS — Z87891 Personal history of nicotine dependence: Secondary | ICD-10-CM | POA: Insufficient documentation

## 2016-12-29 MED ORDER — SODIUM CHLORIDE 0.9 % IV SOLN
INTRAVENOUS | Status: DC
  Administered 2016-12-29: 14:00:00 via INTRAVENOUS
  Filled 2016-12-29: qty 1000

## 2016-12-29 MED ORDER — IRON SUCROSE 20 MG/ML IV SOLN
200.0000 mg | Freq: Once | INTRAVENOUS | Status: AC
  Administered 2016-12-29: 200 mg via INTRAVENOUS
  Filled 2016-12-29: qty 10

## 2017-01-05 ENCOUNTER — Inpatient Hospital Stay: Payer: Medicare HMO

## 2017-01-05 VITALS — BP 118/69 | HR 60 | Temp 95.7°F | Resp 19

## 2017-01-05 DIAGNOSIS — D509 Iron deficiency anemia, unspecified: Secondary | ICD-10-CM | POA: Diagnosis not present

## 2017-01-05 DIAGNOSIS — M1990 Unspecified osteoarthritis, unspecified site: Secondary | ICD-10-CM | POA: Diagnosis not present

## 2017-01-05 DIAGNOSIS — D500 Iron deficiency anemia secondary to blood loss (chronic): Secondary | ICD-10-CM

## 2017-01-05 DIAGNOSIS — I255 Ischemic cardiomyopathy: Secondary | ICD-10-CM | POA: Diagnosis not present

## 2017-01-05 DIAGNOSIS — I2510 Atherosclerotic heart disease of native coronary artery without angina pectoris: Secondary | ICD-10-CM | POA: Diagnosis not present

## 2017-01-05 DIAGNOSIS — I2699 Other pulmonary embolism without acute cor pulmonale: Secondary | ICD-10-CM | POA: Diagnosis not present

## 2017-01-05 DIAGNOSIS — I4891 Unspecified atrial fibrillation: Secondary | ICD-10-CM | POA: Diagnosis not present

## 2017-01-05 DIAGNOSIS — Z79899 Other long term (current) drug therapy: Secondary | ICD-10-CM | POA: Diagnosis not present

## 2017-01-05 DIAGNOSIS — I509 Heart failure, unspecified: Secondary | ICD-10-CM | POA: Diagnosis not present

## 2017-01-05 DIAGNOSIS — I10 Essential (primary) hypertension: Secondary | ICD-10-CM | POA: Diagnosis not present

## 2017-01-05 DIAGNOSIS — Z7901 Long term (current) use of anticoagulants: Secondary | ICD-10-CM | POA: Diagnosis not present

## 2017-01-05 MED ORDER — IRON SUCROSE 20 MG/ML IV SOLN
200.0000 mg | Freq: Once | INTRAVENOUS | Status: AC
  Administered 2017-01-05: 200 mg via INTRAVENOUS
  Filled 2017-01-05: qty 10

## 2017-01-09 DIAGNOSIS — Z872 Personal history of diseases of the skin and subcutaneous tissue: Secondary | ICD-10-CM | POA: Diagnosis not present

## 2017-01-09 DIAGNOSIS — L578 Other skin changes due to chronic exposure to nonionizing radiation: Secondary | ICD-10-CM | POA: Diagnosis not present

## 2017-01-09 DIAGNOSIS — L570 Actinic keratosis: Secondary | ICD-10-CM | POA: Diagnosis not present

## 2017-01-11 ENCOUNTER — Inpatient Hospital Stay: Payer: Medicare HMO

## 2017-01-11 VITALS — BP 138/66 | HR 65

## 2017-01-11 DIAGNOSIS — D500 Iron deficiency anemia secondary to blood loss (chronic): Secondary | ICD-10-CM

## 2017-01-11 DIAGNOSIS — I10 Essential (primary) hypertension: Secondary | ICD-10-CM | POA: Diagnosis not present

## 2017-01-11 DIAGNOSIS — I2510 Atherosclerotic heart disease of native coronary artery without angina pectoris: Secondary | ICD-10-CM | POA: Diagnosis not present

## 2017-01-11 DIAGNOSIS — I509 Heart failure, unspecified: Secondary | ICD-10-CM | POA: Diagnosis not present

## 2017-01-11 DIAGNOSIS — I2699 Other pulmonary embolism without acute cor pulmonale: Secondary | ICD-10-CM | POA: Diagnosis not present

## 2017-01-11 DIAGNOSIS — I4891 Unspecified atrial fibrillation: Secondary | ICD-10-CM | POA: Diagnosis not present

## 2017-01-11 DIAGNOSIS — Z7901 Long term (current) use of anticoagulants: Secondary | ICD-10-CM | POA: Diagnosis not present

## 2017-01-11 DIAGNOSIS — Z79899 Other long term (current) drug therapy: Secondary | ICD-10-CM | POA: Diagnosis not present

## 2017-01-11 DIAGNOSIS — I255 Ischemic cardiomyopathy: Secondary | ICD-10-CM | POA: Diagnosis not present

## 2017-01-11 DIAGNOSIS — D509 Iron deficiency anemia, unspecified: Secondary | ICD-10-CM | POA: Diagnosis not present

## 2017-01-11 DIAGNOSIS — M1990 Unspecified osteoarthritis, unspecified site: Secondary | ICD-10-CM | POA: Diagnosis not present

## 2017-01-11 MED ORDER — IRON SUCROSE 20 MG/ML IV SOLN
200.0000 mg | Freq: Once | INTRAVENOUS | Status: AC
  Administered 2017-01-11: 200 mg via INTRAVENOUS
  Filled 2017-01-11: qty 10

## 2017-01-11 MED ORDER — SODIUM CHLORIDE 0.9 % IV SOLN
INTRAVENOUS | Status: DC
  Administered 2017-01-11: 14:00:00 via INTRAVENOUS
  Filled 2017-01-11: qty 1000

## 2017-01-12 ENCOUNTER — Inpatient Hospital Stay: Payer: Medicare HMO

## 2017-01-12 DIAGNOSIS — I2510 Atherosclerotic heart disease of native coronary artery without angina pectoris: Secondary | ICD-10-CM | POA: Diagnosis not present

## 2017-01-12 DIAGNOSIS — R69 Illness, unspecified: Secondary | ICD-10-CM | POA: Diagnosis not present

## 2017-01-12 DIAGNOSIS — Z86711 Personal history of pulmonary embolism: Secondary | ICD-10-CM | POA: Diagnosis not present

## 2017-01-12 DIAGNOSIS — E782 Mixed hyperlipidemia: Secondary | ICD-10-CM | POA: Diagnosis not present

## 2017-01-12 DIAGNOSIS — I10 Essential (primary) hypertension: Secondary | ICD-10-CM | POA: Diagnosis not present

## 2017-01-12 DIAGNOSIS — Z5181 Encounter for therapeutic drug level monitoring: Secondary | ICD-10-CM | POA: Diagnosis not present

## 2017-01-18 ENCOUNTER — Telehealth: Payer: Self-pay | Admitting: Cardiovascular Disease

## 2017-01-18 NOTE — Telephone Encounter (Signed)
Pt medlist has pt taking Carvedilol 3.125 mg qd. Pharmacy has two separate Rx for carvedilol, please advise correct dose.

## 2017-01-18 NOTE — Telephone Encounter (Signed)
Pharmacy calling stating they received two scripts   We have him taking Carvedilol 3.125 1 day  then Dr Wynetta Emery sent it in 6.125 1 twice a day  Would like to verify which patient is to take  Please call back

## 2017-01-18 NOTE — Telephone Encounter (Signed)
I spoke with the patient. He states he has coreg 3.125 mg BID and coreg 6.25 mg BID at home. Per his report, Dr. Edwina Barth sent in the 6.25 mg BID strength on 12/13/16 and this is what he has been taking. He denies dizziness/ lightheadedness. SBP is 100-120's & his HR is low 60's at home, but he reports a HR of 45 bpm in Dr. Tillman Sers office last week, but nothing was said about this.  I advised him if he is feeling ok the he should continue the 6.25 mg BID dose. He would like me to notify Dr. Rockey Situ of this and make sure he is ok of the change made by Dr. Edwina Barth. Will forward to MD to verify he is agreeable then contact the pharmacy.

## 2017-01-22 ENCOUNTER — Emergency Department
Admission: EM | Admit: 2017-01-22 | Discharge: 2017-01-22 | Disposition: A | Discharge status: Home / Self Care | Payer: Medicare HMO | Attending: Student in an Organized Health Care Education/Training Program | Admitting: Student in an Organized Health Care Education/Training Program

## 2017-01-22 ENCOUNTER — Encounter: Payer: Self-pay | Admitting: Emergency Medicine

## 2017-01-22 ENCOUNTER — Other Ambulatory Visit: Payer: Self-pay

## 2017-01-22 ENCOUNTER — Emergency Department: Payer: Medicare HMO

## 2017-01-22 DIAGNOSIS — I110 Hypertensive heart disease with heart failure: Secondary | ICD-10-CM | POA: Diagnosis not present

## 2017-01-22 DIAGNOSIS — Z79899 Other long term (current) drug therapy: Secondary | ICD-10-CM | POA: Diagnosis not present

## 2017-01-22 DIAGNOSIS — I2510 Atherosclerotic heart disease of native coronary artery without angina pectoris: Secondary | ICD-10-CM | POA: Diagnosis not present

## 2017-01-22 DIAGNOSIS — I5042 Chronic combined systolic (congestive) and diastolic (congestive) heart failure: Secondary | ICD-10-CM | POA: Insufficient documentation

## 2017-01-22 DIAGNOSIS — R079 Chest pain, unspecified: Secondary | ICD-10-CM | POA: Diagnosis present

## 2017-01-22 DIAGNOSIS — I252 Old myocardial infarction: Secondary | ICD-10-CM | POA: Insufficient documentation

## 2017-01-22 DIAGNOSIS — Z951 Presence of aortocoronary bypass graft: Secondary | ICD-10-CM | POA: Diagnosis not present

## 2017-01-22 DIAGNOSIS — Z87891 Personal history of nicotine dependence: Secondary | ICD-10-CM | POA: Insufficient documentation

## 2017-01-22 DIAGNOSIS — R0602 Shortness of breath: Secondary | ICD-10-CM | POA: Diagnosis not present

## 2017-01-22 DIAGNOSIS — Z7901 Long term (current) use of anticoagulants: Secondary | ICD-10-CM | POA: Insufficient documentation

## 2017-01-22 DIAGNOSIS — R072 Precordial pain: Secondary | ICD-10-CM | POA: Diagnosis not present

## 2017-01-22 LAB — BASIC METABOLIC PANEL
ANION GAP: 5 (ref 5–15)
BUN: 20 mg/dL (ref 6–20)
CHLORIDE: 103 mmol/L (ref 101–111)
CO2: 30 mmol/L (ref 22–32)
Calcium: 8.8 mg/dL — ABNORMAL LOW (ref 8.9–10.3)
Creatinine, Ser: 1.3 mg/dL — ABNORMAL HIGH (ref 0.61–1.24)
GFR calc Af Amer: >60 mL/min (ref >60)
GFR, EST NON AFRICAN AMERICAN: 52 mL/min — AB (ref >60)
GLUCOSE: 108 mg/dL — AB (ref 65–99)
POTASSIUM: 4.4 mmol/L (ref 3.5–5.1)
Sodium: 138 mmol/L (ref 135–145)

## 2017-01-22 LAB — CBC
HEMATOCRIT: 45 % (ref 40.0–52.0)
HEMOGLOBIN: 15 g/dL (ref 13.0–18.0)
MCH: 30.3 pg (ref 26.0–34.0)
MCHC: 33.4 g/dL (ref 32.0–36.0)
MCV: 90.8 fL (ref 80.0–100.0)
Platelets: 223 10*3/uL (ref 150–440)
RBC: 4.95 MIL/uL (ref 4.40–5.90)
RDW: 16.4 % — ABNORMAL HIGH (ref 11.5–14.5)
WBC: 5.8 10*3/uL (ref 3.8–10.6)

## 2017-01-22 LAB — TROPONIN I
Troponin I: <0.03 ng/mL (ref <0.03)
Troponin I: <0.03 ng/mL (ref <0.03)

## 2017-01-22 LAB — FIBRIN DERIVATIVES D-DIMER (ARMC ONLY): FIBRIN DERIVATIVES D-DIMER (ARMC): 639.97 ng{FEU}/mL — AB (ref 0.00–499.00)

## 2017-01-22 MED ORDER — LORAZEPAM 1 MG PO TABS
1.0000 mg | ORAL_TABLET | Freq: Once | ORAL | Status: AC
  Administered 2017-01-22: 1 mg via ORAL
  Filled 2017-01-22: qty 1

## 2017-01-22 MED ORDER — IPRATROPIUM-ALBUTEROL 0.5-2.5 (3) MG/3ML IN SOLN
3.0000 mL | Freq: Once | RESPIRATORY_TRACT | Status: AC
  Administered 2017-01-22: 3 mL via RESPIRATORY_TRACT
  Filled 2017-01-22: qty 3

## 2017-01-22 MED ORDER — ALPRAZOLAM 0.5 MG PO TABS: 0.5000 mg | ORAL_TABLET | Freq: Three times a day (TID) | ORAL | 0 refills | Status: DC | PRN

## 2017-01-22 NOTE — Telephone Encounter (Signed)
Okay to continue carvedilol 6.25 mg twice a day Would continue to monitor blood pressure and heart rate

## 2017-01-22 NOTE — ED Triage Notes (Addendum)
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 Pt reminded by this RN to take slow, deep breaths in through his nose & out through his mouth and patient feels better within 2-3 breaths. Pt reports dizziness while moving. Pt reluctant to come but encouraged by his home health nurse.  Denies nausea, vomiting. Pt takes xerelto; history of PE and MI.

## 2017-01-22 NOTE — ED Notes (Signed)
AAOx3.  Skin warm and dry.  NAD 

## 2017-01-22 NOTE — ED Provider Notes (Addendum)
[facility] Emergency Department Provider Note    First MD Initiated Contact with Patient 01/22/17 1311     (approximate)  I have reviewed the triage vital signs and the nursing notes.   HISTORY  Chief Complaint Chest Pain    HPI SUSUMU HACKLER is a 76 y.o. male with a history of CAD as well as PE on anticoagulation presents with chief complaint of shortness of breath chest tightness.  States he was having some discomfort yesterday but was out in his shop working today and had worsening.  Patient also felt weak and was having tingling in his hands.  Patient states he has had similar episodes related to a panic attack in the past.  Denies any active chest pain right now.  Denies any diaphoresis.  No nausea or vomiting.  Past Medical History:  Diagnosis Date  . Arthritis    right hip, since a fall  . CAD (coronary artery disease)    a. 04/2012 Cath: 3VD->Med Rx;  b. 04/2013 PCI RCA (2 DES); c. 03/2014 PCI: LAD 80 (3.0x23 Xience Alpine DES), 30 ISR in RCA; d. 06/2014 CABG x 2 ([facility]) LIMA->LAD, VG->OM; e. 12/2014 Cath ([facility]): patent grafts->Med Rx; f. 08/2015 Cath: patent grafts; g. 11/2016 Cath: LM min irregs, LAD 20p, 13m, LCX 100p/m, RCA 20ost, 10p/m/d, VG->OM1 nl, LIMA->dLAD nl.  . Cardiomyopathy, ischemic    a. 04/2012 Echo: EF 40-45%;  b. 08/2013 Echo: EF 45-50%, mild glob HK, mod lat/post HK, diast dysfxn, mildly dil LA, mild MR, nl RVSP; c. 01/2015 Echo: EF 40-45%, Gr 1 DD, mildly dil LA; 07/2016 Echo: EF 60-65%, no rwma, mild MR, mildly dil LA.  . Carotid arterial disease ([place])    a. 05/2013 Carotid U/S; bilat 40-50% ICA stenosis.  . Chronic Chest Pain   . Chronic systolic CHF (congestive heart failure) ([place])    a. 01/2015 Echo: EF 40-45%; b. 07/2016 Echo: EF 60-65%.  . Cough   . Dyspnea    pt. unsure of exertion cause- that creates SOB at times   . Headache 1970's   migraine  . History of blood transfusion 2016   post op  . Hyperlipidemia   . Hypertension     . Iron deficiency anemia due to chronic blood loss 09/11/2016  . Myocardial infarction [facility])    unsure of when  . Pulmonary emboli ([place]) 07/2016   a. On Xarelto.  . Sternal pain    a. 03/2016 s/p Sternal wire removal; b. 05/2016 s/p redo median sternotomy for sternal debridement and sternal plating.   Family History  Problem Relation Age of Onset  . Heart attack Father 58       MI  . Cancer Maternal Uncle    Past Surgical History:  Procedure Laterality Date  . CARDIAC CATHETERIZATION  05/01/2012  . CARDIAC CATHETERIZATION  04/2013   [facility];x3 stent  . CARDIAC CATHETERIZATION  01/11/15    [facility]  . CARDIAC CATHETERIZATION N/A 09/16/2015   Procedure: LEFT HEART CATH AND CORS/GRAFTS ANGIOGRAPHY;  Surgeon: Minna Merritts, MD;  Location: [place] CV [facility];  Service: Cardiovascular;  Laterality: N/A;  . CARPAL TUNNEL RELEASE     right hand  . CATARACT EXTRACTION     LEFT  . CATARACT EXTRACTION W/PHACO Left 09/16/2014   Procedure: CATARACT EXTRACTION PHACO AND INTRAOCULAR LENS PLACEMENT (IOC);  Surgeon: Leandrew Koyanagi, MD;  Location: [place];  Service: Ophthalmology;  Laterality: Left;  . COLONOSCOPY    . COLONOSCOPY WITH PROPOFOL  N/A 01/28/2016   Procedure: COLONOSCOPY WITH PROPOFOL;  Surgeon: Lollie Sails, MD;  Location: [facility] ENDOSCOPY;  Service: Endoscopy;  Laterality: N/A;  . CORONARY ANGIOPLASTY WITH STENT PLACEMENT  04/13/2014  . CORONARY ARTERY BYPASS GRAFT  06-26-14   x3 bypasses  . DE QUERVAIN'S RELEASE Left 08/22/2012  . ESOPHAGOGASTRODUODENOSCOPY (EGD) WITH PROPOFOL N/A 04/26/2015   Procedure: ESOPHAGOGASTRODUODENOSCOPY (EGD) WITH PROPOFOL;  Surgeon: Hulen Luster, MD;  Location[facility] ENDOSCOPY;  Service: Gastroenterology;  Laterality: N/A;  . LEFT HEART CATH AND CORONARY ANGIOGRAPHY N/A 12/04/2016   Procedure: LEFT HEART CATH AND CORS/GRAFTS  ANGIOGRAPHY;  Surgeon: Wellington Hampshire, MD;  Location: [place] CV LAB;  Service: Cardiovascular;  Laterality: N/A;  .  RIB PLATING N/A 05/25/2016   Procedure: STERNAL PLATING;  Surgeon: Melrose Nakayama, MD;  Location: [place];  Service: Thoracic;  Laterality: N/A;  . right shoulder    . STERNAL WIRES REMOVAL N/A 03/30/2016   Procedure: STERNAL WIRES REMOVAL;  Surgeon: Melrose Nakayama, MD;  Location: [place];  Service: Thoracic;  Laterality: N/A;  . TONSILLECTOMY     Patient Active Problem List   Diagnosis Date Noted  . Syncope 11/19/2016  . 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 Iron deficiency anemia due to chronic blood loss 09/11/2016  . Pulmonary embolus ([place]) 08/31/2016  . Panic disorder   . 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 Panic attack 08/04/2016  . 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 Coronary artery disease involving native coronary artery of native heart with angina pectoris with documented spasm ([place])   . Hx of CABG   . Ischemic cardiomyopathy   . Chronic systolic CHF (congestive heart failure) ([place])   . Chronic Chest Pain   . Coronary artery disease involving coronary bypass graft of native heart with angina pectoris with documented spasm ([place])   . Angina pectoris ([place])   . Anxiety   . Hyperlipemia   . Chronic diastolic heart failure ([place]) 12/21/2014  . Unstable angina ([place]) 12/08/2014  . PAF (paroxysmal atrial fibrillation) ([place]) 12/08/2014  . Chronic anticoagulation 12/08/2014  . Anemia 04/20/2014  . 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 Cardiomyopathy, ischemic   . HTN (hypertension)   . Carotid arterial disease ([place])   . S/P drug eluting coronary stent placement 05/30/2013  . Chest pain with low risk for cardiac etiology 05/05/2013  . SOB (shortness of breath) 05/05/2013  . Coronary artery disease involving coronary bypass graft of native heart with angina pectoris ([place]) 05/05/2013  . Hyperlipidemia 05/05/2013  . History of smoking 05/05/2013  . Cardiac angina ([place]) 05/05/2013      Prior to Admission medications   Medication Sig Start Date End Date  Taking? Authorizing Provider  atorvastatin (LIPITOR) 80 MG tablet Take 80 mg every evening by mouth.     [provider]  carvedilol (COREG) 3.125 MG tablet Take 1 tablet (3.125 mg total) by mouth daily before breakfast. 05/15/16   Gollan, [name], MD  diphenhydramine-acetaminophen (TYLENOL PM) 25-500 MG TABS tablet Take 2 tablets by mouth at bedtime as needed. For pain/sleep    [provider]  isosorbide mononitrate (IMDUR) 60 MG 24 hr tablet Take 60 mg by mouth 2 (two) times daily.     [provider]  nitroGLYCERIN (NITROSTAT) 0.4 MG SL tablet Place 0.4 mg under the tongue every 5 (five) minutes as needed for chest pain.    [provider]  ramipril (ALTACE) 2.5 MG capsule Take 2.5 mg by mouth daily.  [provider]  traMADol (ULTRAM) 50 MG tablet Take 1-2 tablets (50-100 mg total) by mouth every 6 (six) hours as needed for moderate pain. Patient taking differently: Take 50 mg at bedtime as needed by mouth for moderate pain.  06/30/16   Melrose Nakayama, MD  XARELTO 20 MG TABS tablet Take 1 tablet (20 mg total) by mouth daily. 10/23/16   Minna Merritts, MD  zolpidem (AMBIEN) 5 MG tablet Take 5 mg by mouth at bedtime as needed for sleep.     [provider]    Allergies Contrast media [iodinated diagnostic agents]; Oxycodone hcl; and Vicodin [hydrocodone-acetaminophen]    Social History Social History   Tobacco Use  . Smoking status: Former Smoker    Packs/day: 1.00    Years: 35.00    Pack years: 35.00    Types: Cigarettes    Start date: 05/02/2009  . Smokeless tobacco: Never Used  . Tobacco comment: quit in 2006  Substance Use Topics  . Alcohol use: No  . Drug use: No    Review of Systems Patient denies headaches, rhinorrhea, blurry vision, numbness, shortness of breath, chest pain, edema, cough, abdominal pain, nausea, vomiting, diarrhea, dysuria, fevers, rashes or hallucinations unless otherwise stated above in  HPI. ____________________________________________   PHYSICAL EXAM:  VITAL SIGNS: Vitals:   01/22/17 1234 01/22/17 1318  BP: 111/64   Pulse: (!) 55   Resp: 20   Temp: 98.6 F (37 C)   SpO2: 99% 100%    Constitutional: Alert and oriented. Well appearing and in no acute distress. Eyes: Conjunctivae are normal.  Head: Atraumatic. Nose: No congestion/rhinnorhea. Mouth/Throat: Mucous membranes are moist.   Neck: No stridor. Painless ROM.  Cardiovascular: Normal rate, regular rhythm. Grossly normal heart sounds.  Good peripheral circulation. Respiratory: Normal respiratory effort.  No retractions. Lungs CTAB. Gastrointestinal: Soft and nontender. No distention. No abdominal bruits. No CVA tenderness. Genitourinary:  Musculoskeletal: No lower extremity tenderness nor edema.  No joint effusions. Neurologic:  Normal speech and language. No gross focal neurologic deficits are appreciated. No facial droop Skin:  Skin is warm, dry and intact. No rash noted. Psychiatric: Mood and affect are normal. Speech and behavior are normal.  ____________________________________________   LABS (all labs ordered are listed, but only abnormal results are displayed)  Results for orders placed or performed during the hospital encounter of 01/22/17 (from the past 24 hour(s))  Basic metabolic panel     Status: Abnormal   Collection Time: 01/22/17 12:40 PM  Result Value Ref Range   Sodium 138 135 - 145 mmol/L   Potassium 4.4 3.5 - 5.1 mmol/L   Chloride 103 101 - 111 mmol/L   CO2 30 22 - 32 mmol/L   Glucose, Bld 108 (H) 65 - 99 mg/dL   BUN 20 6 - 20 mg/dL   Creatinine, Ser 1.30 (H) 0.61 - 1.24 mg/dL   Calcium 8.8 (L) 8.9 - 10.3 mg/dL   GFR calc non Af Amer 52 (L) >60 mL/min   GFR calc Af Amer >60 >60 mL/min   Anion gap 5 5 - 15  CBC     Status: Abnormal   Collection Time: 01/22/17 12:40 PM  Result Value Ref Range   WBC 5.8 3.8 - 10.6 K/uL   RBC 4.95 4.40 - 5.90 MIL/uL   Hemoglobin 15.0 13.0 -  18.0 g/dL   HCT 45.0 40.0 - 52.0 %   MCV 90.8 80.0 - 100.0 fL   MCH 30.3 26.0 - 34.0 pg  MCHC 33.4 32.0 - 36.0 g/dL   RDW 16.4 (H) 11.5 - 14.5 %   Platelets 223 150 - 440 K/uL  Troponin I     Status: None   Collection Time: 01/22/17 12:40 PM  Result Value Ref Range   Troponin I <0.03 <0.03 ng/mL   ____________________________________________  EKG My review and personal interpretation at Time: 13:04   Indication: chest pain  Rate: 60  Rhythm: sinus Axis: normal  Other: normal intervals, no stemi, ____________________________________________  RADIOLOGY  I personally reviewed all radiographic images ordered to evaluate for the above acute complaints and reviewed radiology reports and findings.  These findings were personally discussed with the patient.  Please see medical record for radiology report.  ____________________________________________   PROCEDURES  Procedure(s) performed:  Procedures    Critical Care performed: no ____________________________________________   INITIAL IMPRESSION / ASSESSMENT AND PLAN / ED COURSE  Pertinent labs & imaging results that were available during my care of the patient were reviewed by me and considered in my medical decision making (see chart for details).  DDX: ACS, pericarditis, esophagitis, boerhaaves, pe, dissection, pna, bronchitis, costochondritis   LAVI Scott [name] is a 76 y.o. who presents to the ED with symptoms as described above.  He is well-appearing and in no acute distress.  Vital signs are stable.  There is no hypoxia no tachycardia and the patient is on anticoagulation but will order d-dimer to further risk stratify.  EKG shows no changes from previous.  Seems less consistent with ACS but the patient is a there is therefore will order serial enzymes.  Clinical Course as of Jan 22 1705  Mon Jan 22, 2017  1443 By age-adjusted dimeric is negative.  He has no hypoxia no tachycardia.  Symptoms do seem to be improving after  Ativan.  No evidence of pneumonia.  Initial troponin is negative.  Will repeat troponin.  [PR]  1528 I spoke with Dr. Rockey Situ regarding the patient's presentation.  No further medical interventions at this time.  Will wait for repeat troponin but I do suspect noncardiac chest pain.  Patient remains Heema dynamically stable.  [PR]  1653 Repeat troponin is negative.  Patient remains hemodynamically stable.  I do believe patient is stable and appropriate for follow-up as an outpatient.  Have discussed with the patient and available family all diagnostics and treatments performed thus far and all questions were answered to the best of my ability. The patient demonstrates understanding and agreement with plan.   [PR]    Clinical Course User Index [PR] Merlyn Lot, MD     ____________________________________________   FINAL CLINICAL IMPRESSION(S) / ED DIAGNOSES  Final diagnoses:  Chest pain, unspecified type      NEW MEDICATIONS STARTED DURING THIS VISIT:  This SmartLink is deprecated. Use AVSMEDLIST instead to display the medication list for a patient.   Note:  This document was prepared using Dragon voice recognition software and may include unintentional dictation errors.    Merlyn Lot, MD 01/22/17 1706    Merlyn Lot, MD 01/22/17 [phone]

## 2017-01-22 NOTE — Telephone Encounter (Signed)
I left a message on the patient's identified home answering machine (DPR) of Dr. Donivan Scull recommendations. I advised the patient I will update this on his chart.

## 2017-01-24 ENCOUNTER — Inpatient Hospital Stay: Payer: Medicare HMO | Attending: Oncology

## 2017-01-24 DIAGNOSIS — G8929 Other chronic pain: Secondary | ICD-10-CM | POA: Diagnosis not present

## 2017-01-24 DIAGNOSIS — I252 Old myocardial infarction: Secondary | ICD-10-CM | POA: Insufficient documentation

## 2017-01-24 DIAGNOSIS — I2510 Atherosclerotic heart disease of native coronary artery without angina pectoris: Secondary | ICD-10-CM | POA: Diagnosis not present

## 2017-01-24 DIAGNOSIS — Z955 Presence of coronary angioplasty implant and graft: Secondary | ICD-10-CM | POA: Diagnosis not present

## 2017-01-24 DIAGNOSIS — I110 Hypertensive heart disease with heart failure: Secondary | ICD-10-CM | POA: Diagnosis not present

## 2017-01-24 DIAGNOSIS — R0789 Other chest pain: Secondary | ICD-10-CM | POA: Insufficient documentation

## 2017-01-24 DIAGNOSIS — D500 Iron deficiency anemia secondary to blood loss (chronic): Secondary | ICD-10-CM

## 2017-01-24 DIAGNOSIS — Z86711 Personal history of pulmonary embolism: Secondary | ICD-10-CM | POA: Diagnosis not present

## 2017-01-24 DIAGNOSIS — Z7901 Long term (current) use of anticoagulants: Secondary | ICD-10-CM | POA: Diagnosis not present

## 2017-01-24 DIAGNOSIS — Z87891 Personal history of nicotine dependence: Secondary | ICD-10-CM | POA: Diagnosis not present

## 2017-01-24 DIAGNOSIS — E785 Hyperlipidemia, unspecified: Secondary | ICD-10-CM | POA: Diagnosis not present

## 2017-01-24 DIAGNOSIS — I739 Peripheral vascular disease, unspecified: Secondary | ICD-10-CM | POA: Diagnosis not present

## 2017-01-24 DIAGNOSIS — F419 Anxiety disorder, unspecified: Secondary | ICD-10-CM | POA: Diagnosis not present

## 2017-01-24 DIAGNOSIS — Z79899 Other long term (current) drug therapy: Secondary | ICD-10-CM | POA: Diagnosis not present

## 2017-01-24 DIAGNOSIS — I5022 Chronic systolic (congestive) heart failure: Secondary | ICD-10-CM | POA: Insufficient documentation

## 2017-01-24 DIAGNOSIS — I480 Paroxysmal atrial fibrillation: Secondary | ICD-10-CM | POA: Diagnosis not present

## 2017-01-24 DIAGNOSIS — I255 Ischemic cardiomyopathy: Secondary | ICD-10-CM | POA: Insufficient documentation

## 2017-01-24 DIAGNOSIS — Z951 Presence of aortocoronary bypass graft: Secondary | ICD-10-CM | POA: Insufficient documentation

## 2017-01-24 DIAGNOSIS — R69 Illness, unspecified: Secondary | ICD-10-CM | POA: Diagnosis not present

## 2017-01-24 LAB — IRON AND TIBC
IRON: 91 ug/dL (ref 45–182)
SATURATION RATIOS: 33 % (ref 17.9–39.5)
TIBC: 280 ug/dL (ref 250–450)
UIBC: 189 ug/dL

## 2017-01-24 LAB — FERRITIN: FERRITIN: 107 ng/mL (ref 24–336)

## 2017-01-25 NOTE — Progress Notes (Signed)
[facility]  Follow Up Visit:  Patient Care Team: Baxter Hire, MD as PCP - General (Internal Medicine) Madelyn Brunner, MD (Internal Medicine) Alisa Graff, FNP as Nurse Practitioner (Cardiology) Minna Merritts, MD as Consulting Physician (Cardiology)  REASON FOR VISIT Follow up for treatment of iron deficiency anemia  HISTORY OF PRESENTING ILLNESS: Scott [name] 77 y.o. male with PMH listed as below who is referred to me for evaluation and management of pulmonary embolism.  Scott [name] has a history paroxymal atrial fibrillation on Eliquis '5mg'$  BID until recently, history of CAD s/p CABG x 2 at [facility] in 2016 with chronic chest pain, s/p removal of sternal wire on 03/2016,  s/p sternal debridement and plating on 05/25/2016 in the setting of CT chest performed by pain management showing sternal nonunion of the manubrium with subsequent improvement in his pain. Perioperatively his anticoagulation with Eliquis was discontinue a few days prior surgery and resumed after surgery. Operation note mentioned that during the procedure, the left lung was densely adherent to the underside of the sternum and tear in the lung did occur and was sutured. He presents  to [facility] on 08/04/2016 for evaluation of SOB and was found to have a PE. Patient states that he was compliant with Eliquis.  CTA chest showed left upper lobe PE extending into the left main pulmonary artery. His Eliquis was changed to Xarelto. Lower extremity dopplers were negative for DVT. Hypercoagulable workup was sent and all tests were negative except a mild low protein S function.  No formal cancer workup.  Sed rate normal, heavy metals negative, TSH mildly reduced at 0.253. 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     Hypercoagulable work up: discussed with patient. He is tested negative for factor 5 leiden mutation, prothrombin mutation, anti  phospholipin antibody panels. Normal protein C activity. Normal total protein S, mildly low protein S function, can be falsely low in the acute setting of thrombosis.Congential protein S deficiency is unlikely given that he has no prior history of VTE. Repeat protein S total and free were normal.   INTERVAL HISTORY Patient presents for follow-up of management of chronic anticoagulation, iron deficiency anemia. During interval is status post IV iron Venofer. He previously had 1 dose of Feraheme and during the treatment he had a chest tightness and end of infusion. Patient was recently in the emergency room complaining of shortness of breath and chest tightness. He is not hypoxic or tachycardic. Symptoms improved after Ativan. Troponins were negative. ER physician talked to Dr. Rockey Situ, no further intervention at this time. Patient's were hemodynamic stable and was discharged home. Patient mentioned that he has had episodes of turning pale. He told me that he always had best circulation of the hands are always cold.  Review of Systems  Constitutional: Negative for fatigue.  HENT:   Negative for mouth sores and nosebleeds.   Eyes: Negative for eye problems and icterus.  Respiratory: Positive for shortness of breath.        SOB with exertion.   Cardiovascular: Negative for chest pain.       Chronic Intermittent chest pain.  Gastrointestinal: Negative for abdominal distention and abdominal pain.  Endocrine: Negative for hot flashes.  Genitourinary: Negative for dysuria and hematuria.   Musculoskeletal: Negative for arthralgias and back pain.  Skin: Negative for itching.  Neurological: Negative for dizziness and headaches.  Hematological: Negative  for adenopathy.  Psychiatric/Behavioral: Negative for confusion. The patient is not nervous/anxious.     MEDICAL HISTORY: Past Medical History:  Diagnosis Date  . Arthritis    right hip, since a fall  . CAD (coronary artery disease)    a. 04/2012  Cath: 3VD->Med Rx;  b. 04/2013 PCI RCA (2 DES); c. 03/2014 PCI: LAD 80 (3.0x23 Xience Alpine DES), 30 ISR in RCA; d. 06/2014 CABG x 2 ([facility]) LIMA->LAD, VG->OM; e. 12/2014 Cath ([facility]): patent grafts->Med Rx; f. 08/2015 Cath: patent grafts; g. 11/2016 Cath: LM min irregs, LAD 20p, 68m LCX 100p/m, RCA 20ost, 10p/m/d, VG->OM1 nl, LIMA->dLAD nl.  . Cardiomyopathy, ischemic    a. 04/2012 Echo: EF 40-45%;  b. 08/2013 Echo: EF 45-50%, mild glob HK, mod lat/post HK, diast dysfxn, mildly dil LA, mild MR, nl RVSP; c. 01/2015 Echo: EF 40-45%, Gr 1 DD, mildly dil LA; 07/2016 Echo: EF 60-65%, no rwma, mild MR, mildly dil LA.  . 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 05/2013 Carotid U/S; bilat 40-50% ICA stenosis.  . Chronic Chest Pain   . Chronic systolic CHF (congestive heart failure) ([place]    a. 01/2015 Echo: EF 40-45%; b. 07/2016 Echo: EF 60-65%.  . Cough   . Dyspnea    pt. unsure of exertion cause- that creates SOB at times   . Headache 1970's   migraine  . History of blood transfusion 2016   post op  . Hyperlipidemia   . Hypertension   . Iron deficiency anemia due to chronic blood loss 09/11/2016  . Myocardial infarction ([facility]    unsure of when  . Pulmonary emboli ([place] 07/2016   a. On Xarelto.  . Sternal pain    a. 03/2016 s/p Sternal wire removal; b. 05/2016 s/p redo median sternotomy for sternal debridement and sternal plating.    SURGICAL HISTORY: Past Surgical History:  Procedure Laterality Date  . CARDIAC CATHETERIZATION  05/01/2012  . CARDIAC CATHETERIZATION  04/2013   [facility];x3 stent  . CARDIAC CATHETERIZATION  01/11/15    [facility]  . CARDIAC CATHETERIZATION N/A 09/16/2015   Procedure: LEFT HEART CATH AND CORS/GRAFTS ANGIOGRAPHY;  Surgeon: TMinna Merritts MD;  [facility];  Service: Cardiovascular;  Laterality: N/A;  . CARPAL TUNNEL RELEASE     right hand  . CATARACT EXTRACTION     LEFT  . CATARACT EXTRACTION W/PHACO Left 09/16/2014   Procedure: CATARACT EXTRACTION PHACO AND  INTRAOCULAR LENS PLACEMENT (IOC);  Surgeon: CLeandrew Koyanagi MD;  [place]  Service: Ophthalmology;  Laterality: Left;  . COLONOSCOPY    . COLONOSCOPY WITH PROPOFOL N/A 01/28/2016   Procedure: COLONOSCOPY WITH PROPOFOL;  Surgeon: MLollie Sails MD;  [facility];  Service: Endoscopy;  Laterality: N/A;  . CORONARY ANGIOPLASTY WITH STENT PLACEMENT  04/13/2014  . CORONARY ARTERY BYPASS GRAFT  06-26-14   x3 bypasses  . DE QUERVAIN'S RELEASE Left 08/22/2012  . ESOPHAGOGASTRODUODENOSCOPY (EGD) WITH PROPOFOL N/A 04/26/2015   Procedure: ESOPHAGOGASTRODUODENOSCOPY (EGD) WITH PROPOFOL;  Surgeon: PHulen Luster MD;  [facility];  Service: Gastroenterology;  Laterality: N/A;  . LEFT HEART CATH AND CORONARY ANGIOGRAPHY N/A 12/04/2016   Procedure: LEFT HEART CATH AND CORS/GRAFTS  ANGIOGRAPHY;  Surgeon: AWellington Hampshire MD;  [place] [facility];  Service: Cardiovascular;  Laterality: N/A;  . RIB PLATING N/A 05/25/2016   Procedure: STERNAL PLATING;  Surgeon: SMelrose Nakayama MD;  [place]  Service: Thoracic;  Laterality: N/A;  . right  shoulder    . STERNAL WIRES REMOVAL N/A 03/30/2016   Procedure: STERNAL WIRES REMOVAL;  Surgeon: Melrose Nakayama, MD;  Location: [place];  Service: Thoracic;  Laterality: N/A;  . TONSILLECTOMY      SOCIAL HISTORY: Social History   Socioeconomic History  . Marital status: Married    Spouse name: Not on file  . Number of children: Not on file  . Years of education: Not on file  . Highest education level: Not on file  Social Needs  . Financial resource strain: Not on file  . Food insecurity - worry: Not on file  . Food insecurity - inability: Not on file  . Transportation needs - medical: Not on file  . Transportation needs - non-medical: Not on file  Occupational History  . Not on file  Tobacco Use  . Smoking status: Former Smoker    Packs/day: 1.00    Years: 35.00    Pack years: 35.00    Types:  Cigarettes    Start date: 05/02/2009  . Smokeless tobacco: Never Used  . Tobacco comment: quit in 2006  Substance and Sexual Activity  . Alcohol use: No  . Drug use: No  . Sexual activity: Yes  Other Topics Concern  . Not on file  Social History Narrative  . Not on file    FAMILY HISTORY Family History  Problem Relation Age of Onset  . Heart attack Father 71       MI  . Cancer Maternal Uncle     ALLERGIES:  is allergic to contrast media [iodinated diagnostic agents]; oxycodone hcl; and vicodin [hydrocodone-acetaminophen].  MEDICATIONS:  Current Outpatient Medications  Medication Sig Dispense Refill  . ALPRAZolam (XANAX) 0.5 MG tablet Take 1 tablet (0.5 mg total) by mouth every 8 (eight) hours as needed for anxiety. 10 tablet 0  . atorvastatin (LIPITOR) 80 MG tablet Take 80 mg every evening by mouth.     . carvedilol (COREG) 6.25 MG tablet Take 6.25 mg by mouth 2 (two) times daily with a meal.    . diphenhydramine-acetaminophen (TYLENOL PM) 25-500 MG TABS tablet Take 2 tablets by mouth at bedtime as needed. For pain/sleep    . isosorbide mononitrate (IMDUR) 60 MG 24 hr tablet Take 60 mg by mouth 2 (two) times daily.     . nitroGLYCERIN (NITROSTAT) 0.4 MG SL tablet Place 0.4 mg under the tongue every 5 (five) minutes as needed for chest pain.    . ramipril (ALTACE) 2.5 MG capsule Take 2.5 mg by mouth daily.    . traMADol (ULTRAM) 50 MG tablet Take 1-2 tablets (50-100 mg total) by mouth every 6 (six) hours as needed for moderate pain. (Patient taking differently: Take 50 mg at bedtime as needed by mouth for moderate pain. ) 30 tablet 0  . XARELTO 20 MG TABS tablet Take 1 tablet (20 mg total) by mouth daily. 90 tablet 2  . zolpidem (AMBIEN) 5 MG tablet Take 5 mg by mouth at bedtime as needed for sleep.      No current facility-administered medications for this visit.     PHYSICAL EXAMINATION:  ECOG PERFORMANCE STATUS: 1 - Symptomatic but completely ambulatory   Vitals:    01/26/17 1134  BP: 100/63  Pulse: (!) 51  Temp: (!) 97.4 F (36.3 C)  SpO2: 97%    Filed Weights   01/26/17 1128  Weight: 174 lb 8 oz (79.2 kg)     Physical Exam  GENERAL: No distress, well nourished.  SKIN:  No rashes or significant lesions  HEAD: Normocephalic, No masses, lesions, tenderness or abnormalities  EYES: Conjunctiva are pink, non icteric ENT: External ears normal ,lips , buccal mucosa, and tongue normal and mucous membranes are moist  LYMPH: No palpable cervical and axillary lymphadenopathy  LUNGS: Clear to auscultation, no crackles or wheezes HEART: Regular rate & rhythm, no murmurs, no gallops, S1 normal and S2 normal  ABDOMEN: Abdomen soft, non-tender, normal bowel sounds,  MUSCULOSKELETAL: No CVA tenderness  EXTREMITIES: No edema, no skin discoloration or tenderness NEURO: Alert & oriented, no focal motor/sensory deficits.    GENERAL: No distress, well nourished.  SKIN:  No rashes or significant lesions  HEAD: Normocephalic, No masses, lesions, tenderness or abnormalities  EYES: Conjunctiva are pale, non icteric ENT: External ears normal ,lips , buccal mucosa, and tongue normal and mucous membranes are moist  LYMPH: No palpable cervical and axillary lymphadenopathy  LUNGS: Clear to auscultation, no crackles or wheezes HEART: Regular rate & rhythm, no murmurs, no gallops, S1 normal and S2 normal  ABDOMEN: Abdomen soft, non-tender, normal bowel sounds,  MUSCULOSKELETAL: No CVA tenderness and no tenderness on percussion of the back or rib cage.  EXTREMITIES: No edema, no skin discoloration or tenderness NEURO: Alert & oriented, no focal motor/sensory deficits.    LABORATORY DATA: I have personally reviewed the data as listed:  Appointment on 01/24/2017  Component Date Value Ref Range Status  . Iron 01/24/2017 91  45 - 182 ug/dL Final  . TIBC 01/24/2017 280  250 - 450 ug/dL Final  . Saturation Ratios 01/24/2017 33  17.9 - 39.5 % Final  . UIBC  01/24/2017 189  ug/dL Final   Performed at [facility], [address], [place], [place] [address]  . Ferritin 01/24/2017 107  24 - 336 ng/mL Final   Performed at [facility], [address] [place], [place] [address]  Admission on 01/22/2017, Discharged on 01/22/2017  Component Date Value Ref Range Status  . Sodium 01/22/2017 138  135 - 145 mmol/L Final  . Potassium 01/22/2017 4.4  3.5 - 5.1 mmol/L Final  . Chloride 01/22/2017 103  101 - 111 mmol/L Final  . CO2 01/22/2017 30  22 - 32 mmol/L Final  . Glucose, Bld 01/22/2017 108* 65 - 99 mg/dL Final  . BUN 01/22/2017 20  6 - 20 mg/dL Final  . Creatinine, Ser 01/22/2017 1.30* 0.61 - 1.24 mg/dL Final  . Calcium 01/22/2017 8.8* 8.9 - 10.3 mg/dL Final  . GFR calc non Af Amer 01/22/2017 52* >60 mL/min Final  . GFR calc Af Amer 01/22/2017 >60  >60 mL/min Final   Comment: (NOTE) The eGFR has been calculated using the CKD EPI equation. This calculation has not been validated in all clinical situations. eGFR's persistently <60 mL/min signify possible Chronic Kidney Disease.   Georgiann Hahn gap 01/22/2017 5  5 - 15 Final   Performed at [facility], [address] [place], [place] [address]  . WBC 01/22/2017 5.8  3.8 - 10.6 K/uL Final  . RBC 01/22/2017 4.95  4.40 - 5.90 MIL/uL Final  . Hemoglobin 01/22/2017 15.0  13.0 - 18.0 g/dL Final  . HCT 01/22/2017 45.0  40.0 - 52.0 % Final  . MCV 01/22/2017 90.8  80.0 - 100.0 fL Final  . MCH 01/22/2017 30.3  26.0 - 34.0 pg Final  . MCHC 01/22/2017 33.4  32.0 - 36.0 g/dL Final  . RDW 01/22/2017 16.4* 11.5 - 14.5 % Final  . Platelets  01/22/2017 223  150 - 440 K/uL Final   Performed at [facility], [address] [place], [place] [address]  . Troponin I 01/22/2017 <0.03  <0.03 ng/mL Final   Performed at [facility], [address] [place], [place] [address]  . Fibrin derivatives D-dimer ([facility]) 01/22/2017 639.97* 0.00 - 499.00 ng/mL (FEU) Final    Comment: (NOTE) <> Exclusion of Venous Thromboembolism (VTE) - OUTPATIENT ONLY   (Emergency Department or [place])   0-499 ng/ml (FEU): With a low to intermediate pretest probability                      for VTE this test result excludes the diagnosis                      of VTE.   >499 ng/ml (FEU) : VTE not excluded; additional work up for VTE is                      required. <> Testing on Inpatients and Evaluation of Disseminated Intravascular   Coagulation (DIC) Reference Range:   0-499 ng/ml (FEU) Performed at [facility], [address] [place], [place] [address]   . Troponin I 01/22/2017 <0.03  <0.03 ng/mL Final   Performed at [facility], [address] [place], [place] [address]   Lab Results  Component Value Date   IRON 91 01/24/2017   TIBC 280 01/24/2017   IRONPCTSAT 33 01/24/2017   Lab Results  Component Value Date   FERRITIN 107 01/24/2017     RADIOGRAPHIC STUDIES: I have personally reviewed the radiological images as listed and agree with the findings in the report 08/08/2016 1. 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 No CT evidence of right heart straining. 2. Multi vessel coronary vascular calcification and CABG surgery changes.  01/28/2016 colonoscopy showed diverticulosis of sigmoid colon and distant descending colon. 04/26/2015 EGD normal.  ASSESSMENT/PLAN 1. Iron deficiency anemia due to chronic blood loss   2. Chronic anticoagulation   3. Peripheral vasoconstriction (HCC)   4. Anxiety    #s/p Venofer '200mg'$ . ferritin improved and is above 100. Hemoglobin stable. Will hold additional IV iron infusion at this point. # Check UA to r/o blood loss through GU tract.  # Advised patient to discuss with primary care physician regarding anxiety treatment and see if that will help with anxiety attack/chest pain. # Patient described clinical  Raynald's phenomenon, which can be autoimmune related or peripheral vascular disease. From hematological standpoint, I would order IgM and viscosity for work up.  # Follow up with me in 3 months with  CBC, iron TIBC, ferritin repeat prior to the visit.   All questions were answered. The patient knows to call the clinic with any problems, questions or concerns.  Earlie Server, MD, PhD Hematology Oncology [facility] at [facility] Pager- [phone] 01/26/2017

## 2017-01-26 ENCOUNTER — Encounter: Payer: Self-pay | Admitting: *Deleted

## 2017-01-26 ENCOUNTER — Other Ambulatory Visit: Payer: Medicare HMO

## 2017-01-26 ENCOUNTER — Inpatient Hospital Stay: Payer: Medicare HMO

## 2017-01-26 ENCOUNTER — Other Ambulatory Visit: Payer: Self-pay

## 2017-01-26 ENCOUNTER — Encounter: Payer: Self-pay | Admitting: Oncology

## 2017-01-26 ENCOUNTER — Inpatient Hospital Stay: Payer: Medicare HMO | Admitting: Oncology

## 2017-01-26 ENCOUNTER — Emergency Department
Admission: EM | Admit: 2017-01-26 | Discharge: 2017-01-26 | Disposition: A | Discharge status: Home / Self Care | Payer: Medicare HMO | Attending: Emergency Medicine | Admitting: Emergency Medicine

## 2017-01-26 VITALS — BP 100/63 | HR 51 | Temp 97.4°F | Ht 70.0 in | Wt 174.5 lb

## 2017-01-26 DIAGNOSIS — R42 Dizziness and giddiness: Secondary | ICD-10-CM | POA: Diagnosis present

## 2017-01-26 DIAGNOSIS — R69 Illness, unspecified: Secondary | ICD-10-CM | POA: Diagnosis not present

## 2017-01-26 DIAGNOSIS — D500 Iron deficiency anemia secondary to blood loss (chronic): Secondary | ICD-10-CM

## 2017-01-26 DIAGNOSIS — F419 Anxiety disorder, unspecified: Secondary | ICD-10-CM | POA: Diagnosis not present

## 2017-01-26 DIAGNOSIS — G8929 Other chronic pain: Secondary | ICD-10-CM

## 2017-01-26 DIAGNOSIS — Z951 Presence of aortocoronary bypass graft: Secondary | ICD-10-CM | POA: Insufficient documentation

## 2017-01-26 DIAGNOSIS — Z7901 Long term (current) use of anticoagulants: Secondary | ICD-10-CM

## 2017-01-26 DIAGNOSIS — R55 Syncope and collapse: Secondary | ICD-10-CM | POA: Diagnosis not present

## 2017-01-26 DIAGNOSIS — R0789 Other chest pain: Secondary | ICD-10-CM

## 2017-01-26 DIAGNOSIS — Z955 Presence of coronary angioplasty implant and graft: Secondary | ICD-10-CM | POA: Insufficient documentation

## 2017-01-26 DIAGNOSIS — I5022 Chronic systolic (congestive) heart failure: Secondary | ICD-10-CM | POA: Insufficient documentation

## 2017-01-26 DIAGNOSIS — Z87891 Personal history of nicotine dependence: Secondary | ICD-10-CM | POA: Insufficient documentation

## 2017-01-26 DIAGNOSIS — I2510 Atherosclerotic heart disease of native coronary artery without angina pectoris: Secondary | ICD-10-CM | POA: Diagnosis not present

## 2017-01-26 DIAGNOSIS — I739 Peripheral vascular disease, unspecified: Secondary | ICD-10-CM | POA: Diagnosis not present

## 2017-01-26 DIAGNOSIS — I951 Orthostatic hypotension: Secondary | ICD-10-CM | POA: Insufficient documentation

## 2017-01-26 DIAGNOSIS — E785 Hyperlipidemia, unspecified: Secondary | ICD-10-CM | POA: Diagnosis not present

## 2017-01-26 DIAGNOSIS — I110 Hypertensive heart disease with heart failure: Secondary | ICD-10-CM | POA: Diagnosis not present

## 2017-01-26 DIAGNOSIS — Z79899 Other long term (current) drug therapy: Secondary | ICD-10-CM

## 2017-01-26 LAB — BASIC METABOLIC PANEL
Anion gap: 7 (ref 5–15)
BUN: 21 mg/dL — AB (ref 6–20)
CO2: 27 mmol/L (ref 22–32)
CREATININE: 1.06 mg/dL (ref 0.61–1.24)
Calcium: 9 mg/dL (ref 8.9–10.3)
Chloride: 103 mmol/L (ref 101–111)
GFR calc Af Amer: >60 mL/min (ref >60)
GFR calc non Af Amer: >60 mL/min (ref >60)
Glucose, Bld: 104 mg/dL — ABNORMAL HIGH (ref 65–99)
Potassium: 4.8 mmol/L (ref 3.5–5.1)
SODIUM: 137 mmol/L (ref 135–145)

## 2017-01-26 LAB — CBC
HCT: 44.7 % (ref 40.0–52.0)
Hemoglobin: 15 g/dL (ref 13.0–18.0)
MCH: 30.3 pg (ref 26.0–34.0)
MCHC: 33.5 g/dL (ref 32.0–36.0)
MCV: 90.5 fL (ref 80.0–100.0)
Platelets: 212 10*3/uL (ref 150–440)
RBC: 4.94 MIL/uL (ref 4.40–5.90)
RDW: 16.2 % — AB (ref 11.5–14.5)
WBC: 5 10*3/uL (ref 3.8–10.6)

## 2017-01-26 LAB — CBC WITH DIFFERENTIAL/PLATELET
Basophils Absolute: 0 10*3/uL (ref 0–0.1)
Basophils Relative: 1 %
Eosinophils Absolute: 0.1 10*3/uL (ref 0–0.7)
Eosinophils Relative: 2 %
HEMATOCRIT: 43.6 % (ref 40.0–52.0)
HEMOGLOBIN: 14.4 g/dL (ref 13.0–18.0)
LYMPHS ABS: 1.6 10*3/uL (ref 1.0–3.6)
Lymphocytes Relative: 37 %
MCH: 30.1 pg (ref 26.0–34.0)
MCHC: 33 g/dL (ref 32.0–36.0)
MCV: 91.3 fL (ref 80.0–100.0)
MONOS PCT: 9 %
Monocytes Absolute: 0.4 10*3/uL (ref 0.2–1.0)
NEUTROS PCT: 51 %
Neutro Abs: 2.3 10*3/uL (ref 1.4–6.5)
Platelets: 205 10*3/uL (ref 150–440)
RBC: 4.78 MIL/uL (ref 4.40–5.90)
RDW: 16.4 % — ABNORMAL HIGH (ref 11.5–14.5)
WBC: 4.4 10*3/uL (ref 3.8–10.6)

## 2017-01-26 LAB — FERRITIN: FERRITIN: 96 ng/mL (ref 24–336)

## 2017-01-26 LAB — TROPONIN I: Troponin I: <0.03 ng/mL (ref <0.03)

## 2017-01-26 NOTE — ED Triage Notes (Signed)
PT reports he was at cancer center this morning and had a sudden onset of dizziness and lightheadedness. Pt reports the staff at the cancer center caught him to prevent a fall. Pt describes event as a near syncopal episode. Pt reports he was evaluated at the cancer center to see if he needed a blood transfusion but pt reports he did not need the transfusion today. Pt reports currently he feels a little "swimy headed" and generalized chest pain with SOB. No increased WOB noted.

## 2017-01-26 NOTE — ED Notes (Signed)
Pt presents today with a near syncopal episode that happens time to time. Pt is A/O with wife and Dr. Jimmye Norman at bedside.

## 2017-01-26 NOTE — ED Provider Notes (Addendum)
[facility] Emergency Department Provider Note       Time seen: ----------------------------------------- 3:38 PM on 01/26/2017 -----------------------------------------   I have reviewed the triage vital signs and the nursing notes.  HISTORY   Chief Complaint Dizziness and Near Syncope    HPI Scott [name] is a 77 y.o. male with a history of hyperlipidemia, hypertension, coronary artery disease, cardiomyopathy, PE on Xarelto who presents to the ED for dizziness.  Patient states he was at the cancer center this morning and he has sudden onset of dizziness and lightheadedness.  He felt near syncopal but did not pass out.  Staff at the cancer center caught him to prevent a fall.  He was being evaluated at the cancer center see if he needed a blood transfusion but he reports he did not need a transfusion today.  He currently feels a little lightheaded but otherwise feels fine.  He denies any recent illness or other complaints.  Past Medical History:  Diagnosis Date  . Arthritis    right hip, since a fall  . CAD (coronary artery disease)    a. 04/2012 Cath: 3VD->Med Rx;  b. 04/2013 PCI RCA (2 DES); c. 03/2014 PCI: LAD 80 (3.0x23 Xience Alpine DES), 30 ISR in RCA; d. 06/2014 CABG x 2 ([facility]) LIMA->LAD, VG->OM; e. 12/2014 Cath ([facility]): patent grafts->Med Rx; f. 08/2015 Cath: patent grafts; g. 11/2016 Cath: LM min irregs, LAD 20p, 66m, LCX 100p/m, RCA 20ost, 10p/m/d, VG->OM1 nl, LIMA->dLAD nl.  . Cardiomyopathy, ischemic    a. 04/2012 Echo: EF 40-45%;  b. 08/2013 Echo: EF 45-50%, mild glob HK, mod lat/post HK, diast dysfxn, mildly dil LA, mild MR, nl RVSP; c. 01/2015 Echo: EF 40-45%, Gr 1 DD, mildly dil LA; 07/2016 Echo: EF 60-65%, no rwma, mild MR, mildly dil LA.  . Carotid arterial disease ([place])    a. 05/2013 Carotid U/S; bilat 40-50% ICA stenosis.  . Chronic Chest Pain   . Chronic systolic CHF (congestive heart failure) ([place])    a. 01/2015 Echo: EF 40-45%; b. 07/2016 Echo: EF  60-65%.  . Cough   . Dyspnea    pt. unsure of exertion cause- that creates SOB at times   . Headache 1970's   migraine  . History of blood transfusion 2016   post op  . Hyperlipidemia   . Hypertension   . Iron deficiency anemia due to chronic blood loss 09/11/2016  . Myocardial infarction [facility] [place])    unsure of when  . Pulmonary emboli ([place]) 07/2016   a. On Xarelto.  . Sternal pain    a. 03/2016 s/p Sternal wire removal; b. 05/2016 s/p redo median sternotomy for sternal debridement and sternal plating.    Patient Active Problem List   Diagnosis Date Noted  . Syncope 11/19/2016  . 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 Iron deficiency anemia due to chronic blood loss 09/11/2016  . Pulmonary embolus ([place]) 08/31/2016  . Panic disorder   . 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 Panic attack 08/04/2016  . 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 Coronary artery disease involving native coronary artery of native heart with angina pectoris with documented spasm ([place])   . Hx of CABG   . Ischemic cardiomyopathy   . Chronic systolic CHF (congestive heart failure) ([place])   . Chronic Chest Pain   . Coronary artery disease involving coronary bypass graft of native heart with angina pectoris with documented spasm ([place])   . Angina pectoris ([place])   . Anxiety   .  Hyperlipemia   . Chronic diastolic heart failure ([place]) 12/21/2014  . Unstable angina ([place]) 12/08/2014  . PAF (paroxysmal atrial fibrillation) ([place]) 12/08/2014  . Chronic anticoagulation 12/08/2014  . Anemia 04/20/2014  . 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 Cardiomyopathy, ischemic   . HTN (hypertension)   . Carotid arterial disease ([place])   . S/P drug eluting coronary stent placement 05/30/2013  . Chest pain with low risk for cardiac etiology 05/05/2013  . SOB (shortness of breath) 05/05/2013  . Coronary artery disease involving coronary bypass graft of native heart with angina pectoris ([place]) 05/05/2013  . Hyperlipidemia  05/05/2013  . History of smoking 05/05/2013  . Cardiac angina ([place]) 05/05/2013    Past Surgical History:  Procedure Laterality Date  . CARDIAC CATHETERIZATION  05/01/2012  . CARDIAC CATHETERIZATION  04/2013   [facility]  . CARDIAC CATHETERIZATION  01/11/15    [facility]  . CARDIAC CATHETERIZATION N/A 09/16/2015   Procedure: LEFT HEART CATH AND CORS/GRAFTS ANGIOGRAPHY;  Surgeon: Minna Merritts, MD;  Location: [place] CV LAB;  Service: Cardiovascular;  Laterality: N/A;  . CARPAL TUNNEL RELEASE     right hand  . CATARACT EXTRACTION     LEFT  . CATARACT EXTRACTION W/PHACO Left 09/16/2014   Procedure: CATARACT EXTRACTION PHACO AND INTRAOCULAR LENS PLACEMENT (IOC);  Surgeon: Leandrew Koyanagi, MD;  Location: [place];  Service: Ophthalmology;  Laterality: Left;  . COLONOSCOPY    . COLONOSCOPY WITH PROPOFOL N/A 01/28/2016   Procedure: COLONOSCOPY WITH PROPOFOL;  Surgeon: Lollie Sails, MD;  Location: [facility] ENDOSCOPY;  Service: Endoscopy;  Laterality: N/A;  . CORONARY ANGIOPLASTY WITH STENT PLACEMENT  04/13/2014  . CORONARY ARTERY BYPASS GRAFT  06-26-14   x3 bypasses  . DE QUERVAIN'S RELEASE Left 08/22/2012  . ESOPHAGOGASTRODUODENOSCOPY (EGD) WITH PROPOFOL N/A 04/26/2015   Procedure: ESOPHAGOGASTRODUODENOSCOPY (EGD) WITH PROPOFOL;  Surgeon: Hulen Luster, MD;  Location: [facility] ENDOSCOPY;  Service: Gastroenterology;  Laterality: N/A;  . LEFT HEART CATH AND CORONARY ANGIOGRAPHY N/A 12/04/2016   Procedure: LEFT HEART CATH AND CORS/GRAFTS  ANGIOGRAPHY;  Surgeon: Wellington Hampshire, MD;  Location: [place] CV LAB;  Service: Cardiovascular;  Laterality: N/A;  . RIB PLATING N/A 05/25/2016   Procedure: STERNAL PLATING;  Surgeon: Melrose Nakayama, MD;  Location: [place];  Service: Thoracic;  Laterality: N/A;  . right shoulder    . STERNAL WIRES REMOVAL N/A 03/30/2016   Procedure: STERNAL WIRES REMOVAL;  Surgeon: Melrose Nakayama, MD;  Location: [place];  Service: Thoracic;  Laterality: N/A;   . TONSILLECTOMY      Allergies Contrast media [iodinated diagnostic agents]; Oxycodone hcl; and Vicodin [hydrocodone-acetaminophen]  Social History Social History   Tobacco Use  . Smoking status: Former Smoker    Packs/day: 1.00    Years: 35.00    Pack years: 35.00    Types: Cigarettes    Start date: 05/02/2009  . Smokeless tobacco: Never Used  . Tobacco comment: quit in 2006  Substance Use Topics  . Alcohol use: No  . Drug use: No    Review of Systems Constitutional: Negative for fever. Eyes: Negative for vision changes ENT:  Negative for congestion, sore throat Cardiovascular: Negative for chest pain. Respiratory: Negative for shortness of breath. Gastrointestinal: Negative for abdominal pain, vomiting and diarrhea. Musculoskeletal: Negative for back pain. Skin: Negative for rash. Neurological: Negative for headaches, positive for dizziness  All systems negative/normal/unremarkable except as stated in the HPI  ____________________________________________   PHYSICAL EXAM:  VITAL SIGNS: ED Triage Vitals  Enc Vitals Group     BP 01/26/17 1312 140/70     Pulse Rate 01/26/17 1312 (!) 58     Resp 01/26/17 1312 16     Temp 01/26/17 1312 97.8 F (36.6 C)     Temp Source 01/26/17 1312 Oral     SpO2 01/26/17 1312 100 %     Weight 01/26/17 1312 174 lb (78.9 kg)     Height 01/26/17 1312 5\' 10"  (1.778 m)     Head Circumference --      Peak Flow --      Pain Score 01/26/17 1311 4     Pain Loc --      Pain Edu? --      Excl. in [place]? --     Constitutional: Alert and oriented. Well appearing and in no distress. Eyes: Conjunctivae are normal. Normal extraocular movements. ENT   Head: Normocephalic and atraumatic.   Nose: No congestion/rhinnorhea.   Mouth/Throat: Mucous membranes are moist.   Neck: No stridor. Cardiovascular: Normal rate, regular rhythm. No murmurs, rubs, or gallops. Respiratory: Normal respiratory effort without tachypnea nor  retractions. Breath sounds are clear and equal bilaterally. No wheezes/rales/rhonchi. Gastrointestinal: Soft and nontender. Normal bowel sounds Musculoskeletal: Nontender with normal range of motion in extremities. No lower extremity tenderness nor edema. Neurologic:  Normal speech and language. No gross focal neurologic deficits are appreciated.  Skin:  Skin is warm, dry and intact. No rash noted. Psychiatric: Mood and affect are normal. Speech and behavior are normal.  ____________________________________________  EKG: Interpreted by me.  Sinus bradycardia with occasional PVCs, rate is 58 bpm, wide QRS, normal QT, normal axis.  ____________________________________________  ED COURSE:  As part of my medical decision making, I reviewed the following data within the [place] History obtained from family if available, nursing notes, old chart and ekg, as well as notes from prior ED visits. Patient presented for dizziness, we will assess with labs and imaging as indicated at this time.   Procedures ____________________________________________   LABS (pertinent positives/negatives)  Labs Reviewed  BASIC METABOLIC PANEL - Abnormal; Notable for the following components:      Result Value   Glucose, Bld 104 (*)    BUN 21 (*)    All other components within normal limits  CBC - Abnormal; Notable for the following components:   RDW 16.2 (*)    All other components within normal limits  TROPONIN I  URINALYSIS, COMPLETE (UACMP) WITH MICROSCOPIC  CBG MONITORING, ED  ____________________________________________  DIFFERENTIAL DIAGNOSIS   Near syncope, dehydration, electrolyte abnormality, medication side effect, anemia, occult infection  FINAL ASSESSMENT AND PLAN  Near syncope, orthostatic hypotension   Plan: Patient had presented for near syncope. Patient's labs were unremarkable but he was symptomatic going from lying to sitting or sitting to standing.  He does  appear to have some orthostatic changes in his vital signs.  And I have discussed drinking extra fluid as well as taking extra time between sitting and standing or lying and sitting.  I have advised he needs to follow-up with his primary care doctor if this is not improving.   Earleen Newport, MD   Note: This note was generated in part or whole with voice recognition software. Voice recognition is usually quite accurate but there are transcription errors that can and very often do occur. I apologize for any typographical errors that were not detected and corrected.     Earleen Newport, MD 01/26/17 1540  Earleen Newport, MD 01/26/17 1541    Earleen Newport, MD 01/26/17 [phone]

## 2017-01-26 NOTE — ED Notes (Signed)
ED Provider at bedside. 

## 2017-01-26 NOTE — Progress Notes (Signed)
Patient here for follow up no changes since last appointment he states that he is short of breath.

## 2017-01-27 LAB — IGG, IGA, IGM
IGM (IMMUNOGLOBULIN M), SRM: 82 mg/dL (ref 15–143)
IgA: 331 mg/dL (ref 61–437)
IgG (Immunoglobin G), Serum: 939 mg/dL (ref 700–1600)

## 2017-01-29 LAB — VISCOSITY, SERUM: Viscosity, Serum: 1.7 rel.saline (ref 1.6–1.9)

## 2017-02-02 DIAGNOSIS — I5032 Chronic diastolic (congestive) heart failure: Secondary | ICD-10-CM | POA: Diagnosis not present

## 2017-02-02 DIAGNOSIS — R0609 Other forms of dyspnea: Secondary | ICD-10-CM | POA: Diagnosis not present

## 2017-02-02 DIAGNOSIS — J439 Emphysema, unspecified: Secondary | ICD-10-CM | POA: Diagnosis not present

## 2017-02-08 DIAGNOSIS — E785 Hyperlipidemia, unspecified: Secondary | ICD-10-CM | POA: Diagnosis not present

## 2017-02-08 DIAGNOSIS — I739 Peripheral vascular disease, unspecified: Secondary | ICD-10-CM | POA: Diagnosis not present

## 2017-02-08 DIAGNOSIS — R69 Illness, unspecified: Secondary | ICD-10-CM | POA: Diagnosis not present

## 2017-02-08 DIAGNOSIS — Z7901 Long term (current) use of anticoagulants: Secondary | ICD-10-CM | POA: Diagnosis not present

## 2017-02-08 DIAGNOSIS — I25119 Atherosclerotic heart disease of native coronary artery with unspecified angina pectoris: Secondary | ICD-10-CM | POA: Diagnosis not present

## 2017-02-08 DIAGNOSIS — R32 Unspecified urinary incontinence: Secondary | ICD-10-CM | POA: Diagnosis not present

## 2017-02-08 DIAGNOSIS — I252 Old myocardial infarction: Secondary | ICD-10-CM | POA: Diagnosis not present

## 2017-02-08 DIAGNOSIS — G8929 Other chronic pain: Secondary | ICD-10-CM | POA: Diagnosis not present

## 2017-02-08 DIAGNOSIS — I509 Heart failure, unspecified: Secondary | ICD-10-CM | POA: Diagnosis not present

## 2017-02-08 DIAGNOSIS — G3184 Mild cognitive impairment, so stated: Secondary | ICD-10-CM | POA: Diagnosis not present

## 2017-02-10 NOTE — Progress Notes (Signed)
Cardiology Office Note  Date:  02/13/2017   ID:  Scott [name], DOB 23-Jun-1940, MRN [id number]  PCP:  Baxter Hire, MD   Chief Complaint  Patient presents with  . Other    ED follow up. Patient c/o Chest pain, SOB, and Dizziness. Meds reviewed verbally with patient.    HPI:  Scott [name] is a 77 year old gentleman with history of  Chronic chest pain syndrome/anxiety/panic attacks CAD, stenting CABG 2 with LIMA to the LAD 06/2014, vein graft to the OM at [facility],  chronic chest pain not relieved with bypass surgery,  Redo median sternotomy for sternal plating  on 05/25/2016 EF 40 to 45% 01/2015 Smoker, age 74 to 3 quit 2005 Pulmonary embolism July 2018, workup through hematology who presents for follow-up of his anxiety and chronic stable angina pectoralis  Notes reviewed for history as below ER visits Dizziness 01/26/2017 Chest pain 01/22/2017 Mental status change/anxiety  11/19/2016 Chest pain  11/07/2016 Chest pain  09/11/2016 SOB  08/04/2016 Chest pain 02/12/2016  Cath  12/04/2016 Cath 09/16/2015 Cath 01/11/2015  bypass surgery June 2016 at [facility]  Cath 05/29/2014, 50% LAD lesion, distal RCA disease Cath 04/13/2014, DES to the mid LAD Cath 05/16/2013,  stent to the RCA  Stress test 11/08/2016 Stress test 06/15/2015 Stress test 04/21/2015 Stress test 04/08/2014 Stress test 07/29/2012 Stress test 04/22/2012   hospitalization November 07, 2016 for chest pain Stress test showed no ischemia  Despite this he had cardiac catheterization 12/04/2016 for chest pain Medical management recommended  hospital November 19, 2016  Had involuntary leg movements, Described as myoclonus and mental status changes  CT scan of the head, was normal MRI performed, seen by neurology Showed sinusitis on MR  Felt to have possible panic attacks EEG unremarkable Zoloft was increased He was told not to drive by neurology for 1 month given acute onset of anxiety attack  On today's visit he  reports having dizziness, shortness of breath symptoms Seen by pulmonary, started on inhalers Mild orthostasis symptoms No regular exercise program Seen by Dr. Raul Del, note wondering whether he would benefit from VO2 max study  PFTs done through his office, results unclear  EKG reviewed personally by myself on today's visit shows normal sinus rhythm, rate 61 bpm  Consider old inferior  MI, intraventricular conduction delay  Referred from pain clinic in [place] to [place] surg for consult Bone scan:diffuse increased uptake in the manubrium and upper sternum sternal wire removal on 03/30/2016. Redo median sternotomy for sternal plating by Dr. Roxan Hockey on 05/25/2016. lung densely adherent to the underside of the sternum and there was a small laceration of the left upper lobe  Other past medical history history of CAD dating back to April 2014 at which time he was found to have severe distal RCA disease with left to right collaterals, a subtotally occluded left circumflex, and moderate, diffuse LAD disease. He was initially medically managed.   In April 2015, he underwent repeat catheterization secondary to ongoing chest pain, showing stable moderate to severe disease. He then underwent successful RCA stenting.   In March 2016, due to recurrent chest pain, he underwent successful stenting of the LAD.    he had recurrent chest pain again in June 2016 and was seen at [facility].  He underwent CABG 2 with a LIMA to the LAD and a vein graft to the obtuse marginal.   following CABG, he continued to have persistent/chronic low-level left-sided and midsternal chest pain.  He had repeat hospitalizations  with  stress testing in November at [facility], which was negative,  catheterization at [facility] in December 2016, which apparently showed patent grafts.  Lab work in the hospital  showing total cholesterol 142, LDL 77, HDL 47  Echocardiogram 03/02/2014 showing normal LV function, essentially normal  study  Echocardiogram 04/22/2012 showing ejection fraction 40%, no mention of wall motion abnormality, mild MR, TR  PMH:   has a past medical history of Arthritis, CAD (coronary artery disease), Cardiomyopathy, ischemic, Carotid arterial disease ([place]), Chronic Chest Pain, Chronic systolic CHF (congestive heart failure) ([place]), Cough, Dyspnea, Headache (1970's), History of blood transfusion (2016), Hyperlipidemia, Hypertension, Iron deficiency anemia due to chronic blood loss (09/11/2016), Myocardial infarction [facility]), Pulmonary emboli ([place]) (07/2016), and Sternal pain.  PSH:    Past Surgical History:  Procedure Laterality Date  . CARDIAC CATHETERIZATION  05/01/2012  . CARDIAC CATHETERIZATION  04/2013   [facility]  . CARDIAC CATHE[facility]IZATION  01/11/15    [facility]  . CARDIAC CATHETERIZATION N/A 09/16/2015   Procedure: LEFT HEART CATH AND CORS/GRAFTS ANGIOGRAPHY;  Surgeon: Minna Merritts, MD;  Location: [place] CV LAB;  Service: Cardiovascular;  Laterality: N/A;  . CARPAL TUNNEL RELEASE     right hand  . CATARACT EXTRACTION     LEFT  . CATARACT EXTRACTION W/PHACO Left 09/16/2014   Procedure: CATARACT EXTRACTION PHACO AND INTRAOCULAR LENS PLACEMENT (IOC);  Surgeon: Leandrew Koyanagi, MD;  Location: [place];  Service: Ophthalmology;  Laterality: Left;  . COLONOSCOPY    . COLONOSCOPY WITH PROPOFOL N/A 01/28/2016   Procedure: COLONOSCOPY WITH PROPOFOL;  Surgeon: Lollie Sails, MD;  Location: [facility] ENDOSCOPY;  Service: Endoscopy;  Laterality: N/A;  . CORONARY ANGIOPLASTY WITH STENT PLACEMENT  04/13/2014  . CORONARY ARTERY BYPASS GRAFT  06-26-14   x3 bypasses  . DE QUERVAIN'S RELEASE Left 08/22/2012  . ESOPHAGOGASTRODUODENOSCOPY (EGD) WITH PROPOFOL N/A 04/26/2015   Procedure: ESOPHAGOGASTRODUODENOSCOPY (EGD) WITH PROPOFOL;  Surgeon: Hulen Luster, MD;  Location: [facility] ENDOSCOPY;  Service: Gastroenterology;  Laterality: N/A;  . LEFT HEART CATH AND CORONARY ANGIOGRAPHY N/A 12/04/2016    Procedure: LEFT HEART CATH AND CORS/GRAFTS  ANGIOGRAPHY;  Surgeon: Wellington Hampshire, MD;  Location: [place] CV LAB;  Service: Cardiovascular;  Laterality: N/A;  . RIB PLATING N/A 05/25/2016   Procedure: STERNAL PLATING;  Surgeon: Melrose Nakayama, MD;  Location: [place];  Service: Thoracic;  Laterality: N/A;  . right shoulder    . STERNAL WIRES REMOVAL N/A 03/30/2016   Procedure: STERNAL WIRES REMOVAL;  Surgeon: Melrose Nakayama, MD;  [facility] OR;  Service: Thoracic;  Laterality: N/A;  . TONSILLECTOMY      Current Outpatient Medications  Medication Sig Dispense Refill  . ALPRAZolam (XANAX) 0.5 MG tablet Take 1 tablet (0.5 mg total) by mouth every 8 (eight) hours as needed for anxiety. 10 tablet 0  . atorvastatin (LIPITOR) 80 MG tablet Take 80 mg every evening by mouth.     . carvedilol (COREG) 6.25 MG tablet Take 6.25 mg by mouth 2 (two) times daily with a meal.    . diphenhydramine-acetaminophen (TYLENOL PM) 25-500 MG TABS tablet Take 2 tablets by mouth at bedtime as needed. For pain/sleep    . isosorbide mononitrate (IMDUR) 60 MG 24 hr tablet Take 60 mg by mouth 2 (two) times daily.     . nitroGLYCERIN (NITROSTAT) 0.4 MG SL tablet Place 0.4 mg under the tongue every 5 (five) minutes as needed for chest pain.    . ramipril (ALTACE) 2.5 MG capsule Take  2.5 mg by mouth daily.    Marland Kitchen tiotropium (SPIRIVA HANDIHALER) 18 MCG inhalation capsule Place into inhaler and inhale.    . traMADol (ULTRAM) 50 MG tablet Take 1-2 tablets (50-100 mg total) by mouth every 6 (six) hours as needed for moderate pain. (Patient taking differently: Take 50 mg at bedtime as needed by mouth for moderate pain. ) 30 tablet 0  . XARELTO 20 MG TABS tablet Take 1 tablet (20 mg total) by mouth daily. 90 tablet 2  . zolpidem (AMBIEN) 5 MG tablet      No current facility-administered medications for this visit.      Allergies:   Contrast media [iodinated diagnostic agents]; Oxycodone hcl; and Vicodin  [hydrocodone-acetaminophen]   Social History:  The patient  reports that he has quit smoking. His smoking use included cigarettes. He started smoking about 7 years ago. He has a 35.00 pack-year smoking history. he has never used smokeless tobacco. He reports that he does not drink alcohol or use drugs.   Family History:   family history includes Cancer in his maternal uncle; Heart attack (age of onset: 43) in his father.    Review of Systems: Review of Systems  Respiratory: Positive for shortness of breath.   Cardiovascular: Negative.   Gastrointestinal: Negative.   Musculoskeletal: Negative.   Neurological: Positive for dizziness.  Psychiatric/Behavioral: Negative.   All other systems reviewed and are negative.    PHYSICAL EXAM: VS:  BP 110/60 (BP Location: Left Arm, Patient Position: Sitting, Cuff Size: Normal)   Pulse 61   Ht 5\' 10"  (1.778 m)   Wt 171 lb 12 oz (77.9 kg)   BMI 24.64 kg/m  , BMI Body mass index is 24.64 kg/m. GEN: Well nourished, well developed, in no acute distress  HEENT: normal  Neck: no JVD, carotid bruits, or masses Cardiac: RRR; no murmurs, rubs, or gallops,no edema  Well healed mediastinal incision Respiratory:  clear to auscultation bilaterally, normal work of breathing GI: soft, nontender, nondistended, + BS MS: no deformity or atrophy  Skin: warm and dry, no rash Neuro:  Strength and sensation are intact Psych: euthymic mood, full affect   Recent Labs: 11/07/2016: Magnesium 1.8 11/19/2016: TSH 3.714 11/20/2016: ALT 17 01/26/2017: BUN 21; Creatinine, Ser 1.06; Hemoglobin 15.0; Platelets 212; Potassium 4.8; Sodium 137    Lipid Panel Lab Results  Component Value Date   CHOL 103 11/07/2016   HDL 41 11/07/2016   LDLCALC 56 11/07/2016   TRIG 31 11/07/2016      Wt Readings from Last 3 Encounters:  02/13/17 171 lb 12 oz (77.9 kg)  01/26/17 174 lb (78.9 kg)  01/26/17 174 lb 8 oz (79.2 kg)       ASSESSMENT AND PLAN:  PAF (paroxysmal  atrial fibrillation) (HCC)CBC with Differential/Platelet, INR/PT Remote history, this has not been a frequent occurrence  Chest pain, unspecified chest pain type -  Long history of chest pain Frequent cardiac catheterizations, stress testing Medical management recommended for recurrent pain symptoms Anxiety/panic likely playing a role in his presentations On last clinic visit he had chest pain that was "different", and cardiac catheterization was done showing no change in disease  Coronary artery disease involving native coronary artery of native heart  Medical management Long history of repeated stress test in catheterizations for recurrent symptoms As above anxiety likely paying overall. Previously seen by psychiatry and neurology  Hyperlipidemia Cholesterol is at goal on the current lipid regimen. No changes to the medications were made.  Pulmonary embolism  Coag workup negative It has been 6 months, will stop Xarelto We will restart low-dose aspirin with Xarelto 2.5 twice a day for cad/CABG/PAD  Dizziness Blood pressure low on today's visit Recommended he decrease coreg down to 3.125 mg twice a day If he continues to have orthostasis symptoms we will decrease the isosorbide in the evening down to 30 mg, continue 60 mg in the morning  Shortness of breath 45 years of smoking, likely with COPD PFTs from [facility] unavailable, performed recently Recently started on albuterol and Spiriva He is requesting transfer to [facility] pulmonary for management Will defer to them, he may benefit from respiratory rehabilitation Unclear if he needs adjustment to his inhalers I am not sure if exercise stress test with VO2 max is going to help with management. Discussed with him at length today, results would likely recommend continued exercise and underlying lung disease   encounter time more than 45 minutes  Greater than 50% was spent in counseling and coordination of care with the  patient  Disposition:   F/U  6 month   Orders Placed This Encounter  Procedures  . EKG 12-Lead     Signed, Esmond Plants, M.D., Ph.D. 02/13/2017  [place], [place]

## 2017-02-13 ENCOUNTER — Encounter: Payer: Self-pay | Admitting: Cardiovascular Disease

## 2017-02-13 ENCOUNTER — Ambulatory Visit: Payer: Medicare HMO | Admitting: Cardiovascular Disease

## 2017-02-13 VITALS — BP 110/60 | HR 61 | Ht 70.0 in | Wt 171.8 lb

## 2017-02-13 DIAGNOSIS — I480 Paroxysmal atrial fibrillation: Secondary | ICD-10-CM

## 2017-02-13 DIAGNOSIS — I739 Peripheral vascular disease, unspecified: Secondary | ICD-10-CM

## 2017-02-13 DIAGNOSIS — I10 Essential (primary) hypertension: Secondary | ICD-10-CM

## 2017-02-13 DIAGNOSIS — I25709 Atherosclerosis of coronary artery bypass graft(s), unspecified, with unspecified angina pectoris: Secondary | ICD-10-CM

## 2017-02-13 DIAGNOSIS — I779 Disorder of arteries and arterioles, unspecified: Secondary | ICD-10-CM

## 2017-02-13 DIAGNOSIS — I209 Angina pectoris, unspecified: Secondary | ICD-10-CM | POA: Diagnosis not present

## 2017-02-13 DIAGNOSIS — I255 Ischemic cardiomyopathy: Secondary | ICD-10-CM | POA: Diagnosis not present

## 2017-02-13 DIAGNOSIS — E782 Mixed hyperlipidemia: Secondary | ICD-10-CM

## 2017-02-13 NOTE — Patient Instructions (Addendum)
Medication Instructions:   When the xarelto 20 runs out, stop  Please start aspirin 81 mg daily Please start xarelto 2.5 mg twice a day  Consider cutting the coreg in 1/2 twice a day If you continue to get dizzy Cut the isosorbide in 1/2 at night   Medication Samples have been provided to the patient.  Drug name: Xarelto       Strength: 2.5 mg        Qty: 2 bottles  LOT: [id number]  Exp.Date: 9/21    Labwork:  No new labs needed  Testing/Procedures:  No further testing at this time   Follow-Up:  You have been referred to Dr. Alva Garnet at Pulmonary office.  It was a pleasure seeing you in the office today. Please call us if you have new issues that need to be addressed before your next appt.  [phone]  Your physician wants you to follow-up in: 6 months.  You will receive a reminder letter in the mail two months in advance. If you don't receive a letter, please call our office to schedule the follow-up appointment.  If you need a refill on your cardiac medications before your next appointment, please call your pharmacy.

## 2017-02-14 ENCOUNTER — Encounter: Payer: Self-pay | Admitting: Cardiovascular Disease

## 2017-02-14 NOTE — Telephone Encounter (Signed)
This encounter was created in error - please disregard.

## 2017-02-16 ENCOUNTER — Encounter: Payer: Self-pay | Admitting: Pulmonary Disease

## 2017-02-16 ENCOUNTER — Ambulatory Visit: Payer: Medicare HMO | Admitting: Pulmonary Disease

## 2017-02-16 ENCOUNTER — Institutional Professional Consult (permissible substitution): Payer: Medicare HMO | Admitting: Pulmonary Disease

## 2017-02-16 VITALS — BP 100/62 | HR 49 | Ht 70.0 in | Wt 175.0 lb

## 2017-02-16 DIAGNOSIS — Z86711 Personal history of pulmonary embolism: Secondary | ICD-10-CM | POA: Diagnosis not present

## 2017-02-16 DIAGNOSIS — R0609 Other forms of dyspnea: Secondary | ICD-10-CM

## 2017-02-16 DIAGNOSIS — Z87891 Personal history of nicotine dependence: Secondary | ICD-10-CM

## 2017-02-16 DIAGNOSIS — I421 Obstructive hypertrophic cardiomyopathy: Secondary | ICD-10-CM | POA: Diagnosis not present

## 2017-02-16 NOTE — Patient Instructions (Addendum)
Discontinue carvedilol (Coreg) You may try on and off Spiriva inhaler to see if it makes any difference in your breathing and other symptoms  Do 1 week trial on, one week off, etc. Full pulmonary function tests 6-minute walk test ordered Follow-up in 1 month

## 2017-02-22 ENCOUNTER — Ambulatory Visit (INDEPENDENT_AMBULATORY_CARE_PROVIDER_SITE_OTHER): Payer: Medicare HMO | Admitting: *Deleted

## 2017-02-22 ENCOUNTER — Ambulatory Visit: Payer: Medicare HMO | Attending: Pulmonary Disease

## 2017-02-22 DIAGNOSIS — R0609 Other forms of dyspnea: Secondary | ICD-10-CM

## 2017-02-22 DIAGNOSIS — R0600 Dyspnea, unspecified: Secondary | ICD-10-CM | POA: Insufficient documentation

## 2017-02-22 MED ORDER — ALBUTEROL SULFATE (2.5 MG/3ML) 0.083% IN NEBU
2.5000 mg | INHALATION_SOLUTION | Freq: Once | RESPIRATORY_TRACT | Status: AC
  Administered 2017-02-22: 2.5 mg via RESPIRATORY_TRACT
  Filled 2017-02-22: qty 3

## 2017-02-22 NOTE — Patient Instructions (Signed)
Follow up previously scheduled. 

## 2017-02-22 NOTE — Progress Notes (Signed)
SIX MIN WALK 02/22/2017  Medications Imdur 60 mg/Xarelto 20 mg  Supplimental Oxygen during Test? (L/min) No  Laps 6  Partial Lap (in Meters) 3  Baseline BP (sitting) 128/84  Baseline Heartrate 61  Baseline Dyspnea (Borg Scale) 2  Baseline Fatigue (Borg Scale) 0  Baseline SPO2 99  BP (sitting) 144/72  Heartrate 72  Dyspnea (Borg Scale) 6  Fatigue (Borg Scale) 1  SPO2 91  BP (sitting) 140/70  Heartrate 64  SPO2 99  Stopped or Paused before Six Minutes No  Interpretation Angina;Dizziness  Distance Completed 291  Tech Comments: Pt had angina before walk began. He was asked if he wanted to reschedule walk. Pt declined and wanted to proceed. Pt walked a brisk pace though his gait was unsteady at turns. Patient completed 6 mins without desaturation.     SMW completed

## 2017-02-23 NOTE — Progress Notes (Signed)
PULMONARY CONSULT NOTE  Requesting MD/Service: Rockey Situ Date of initial consultation: 02/16/17 Reason for consultation: DOE  PT PROFILE: 77 y.o. 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  Mild mitral regurgitation.  Left atrium mildly dilated.  Right-sided pressures normal. LHC 12/04/16: Significant underlying three-vessel coronary artery disease with patent stents in the LAD and right coronary artery with only mild in-stent restenosis.  Patent LIMA to LAD but the native LAD is mostly filling via antegrade flow in the native vessel.  Patent SVG to ramus or high diagonal.  Chronically occluded mid left circumflex with collaterals from the ramus.  No significant change in coronary anatomy since most recent cardiac catheterization. Mildly to moderately reduced LV systolic function with an EF of 40%.  Normal left ventricular end-diastolic pressure   HPI:  He is referred by Dr. Rockey Situ for evaluation of exertional dyspnea of approximately 9 months duration.  Prior to that he was able to walk more than a mile at a brisk pace without problems.  Now, he reports dyspnea with ambulation associated with lightheadedness and chest tightness.  He also reports orthostasis.  His exertional dyspnea appears to be variable from day-to-day.  He has undergone an extensive cardiac evaluation over the past few months without a clear etiology of his exertional dyspnea.  Of note, in May 2018, he underwent a revision sternotomy with plating of his sternum.  He denies CP, fever, purulent sputum, hemoptysis, LE edema, calf tenderness, orthopnea, paroxysmal nocturnal dyspnea.    Past Medical History:  Diagnosis Date  . Arthritis    right hip, since a fall  . CAD (coronary artery disease)    a. 04/2012 Cath: 3VD->Med Rx;  b. 04/2013 PCI RCA (2 DES); c.  03/2014 PCI: LAD 80 (3.0x23 Xience Alpine DES), 30 ISR in RCA; d. 06/2014 CABG x 2 ([facility]) LIMA->LAD, VG->OM; e. 12/2014 Cath ([facility]): patent grafts->Med Rx; f. 08/2015 Cath: patent grafts; g. 11/2016 Cath: LM min irregs, LAD 20p, 50m, LCX 100p/m, RCA 20ost, 10p/m/d, VG->OM1 nl, LIMA->dLAD nl.  . Cardiomyopathy, ischemic    a. 04/2012 Echo: EF 40-45%;  b. 08/2013 Echo: EF 45-50%, mild glob HK, mod lat/post HK, diast dysfxn, mildly dil LA, mild MR, nl RVSP; c. 01/2015 Echo: EF 40-45%, Gr 1 DD, mildly dil LA; 07/2016 Echo: EF 60-65%, no rwma, mild MR, mildly dil LA.  . Carotid arterial disease ([place])    a. 05/2013 Carotid U/S; bilat 40-50% ICA stenosis.  . Chronic Chest Pain   . Chronic systolic CHF (congestive heart failure) ([place])    a. 01/2015 Echo: EF 40-45%; b. 07/2016 Echo: EF 60-65%.  . Cough   . Dyspnea    pt. unsure of exertion cause- that creates SOB at times   . Headache 1970's   migraine  . History of blood transfusion 2016   post op  . Hyperlipidemia   . Hypertension   . Iron deficiency anemia due to chronic blood loss 09/11/2016  . Myocardial infarction [facility])    unsure of when  . Pulmonary emboli ([place]) 07/2016   a. On Xarelto.  . Sternal pain    a. 03/2016 s/p Sternal wire removal; b. 05/2016 s/p redo median sternotomy for sternal debridement and sternal plating.    Past Surgical History:  Procedure Laterality Date  . CARDIAC CATHETERIZATION  05/01/2012  . CARDIAC CATHETERIZATION  04/2013   [facility];x3 stent  .  CARDIAC CATHETERIZATION  01/11/15    [facility]  . CARDIAC CATHETERIZATION N/A 09/16/2015   Procedure: LEFT HEART CATH AND CORS/GRAFTS ANGIOGRAPHY;  Surgeon: Minna Merritts, MD;  Location: [place] [facility];  Service: Cardiovascular;  Laterality: N/A;  . CARPAL TUNNEL RELEASE     right hand  . CATARACT EXTRACTION     LEFT  . CATARACT EXTRACTION W/PHACO Left 09/16/2014   Procedure: CATARACT EXTRACTION PHACO AND INTRAOCULAR LENS PLACEMENT (IOC);  Surgeon: Leandrew Koyanagi, MD;   Location: [place];  Service: Ophthalmology;  Laterality: Left;  . COLONOSCOPY    . COLONOSCOPY WITH PROPOFOL N/A 01/28/2016   Procedure: COLONOSCOPY WITH PROPOFOL;  Surgeon: Lollie Sails, MD;  [facility] END[facility];  Service: Endoscopy;  Laterality: N/A;  . CORONARY ANGIOPLASTY WITH STENT PLACEMENT  04/13/2014  . CORONARY ARTERY BYPASS GRAFT  06-26-14   x3 bypasses  . DE QUERVAIN'S RELEASE Left 08/22/2012  . ESOPHAGOGASTRODUODENOSCOPY (EGD) WITH PROPOFOL N/A 04/26/2015   Procedure: ESOPHAGOGASTRODUODENOSCOPY (EGD) WITH PROPOFOL;  Surgeon: Hulen Luster, MD;  Location: [facility] ENDOSCOPY;  Service: Gastroenterology;  Laterality: N/A;  . LEFT HEART CATH AND CORONARY ANGIOGRAPHY N/A 12/04/2016   Procedure: LEFT HEART CATH AND CORS/GRAFTS  ANGIOGRAPHY;  Surgeon: Wellington Hampshire, MD;  Location: [place] [facility];  Service: Cardiovascular;  Laterality: N/A;  . RIB PLATING N/A 05/25/2016   Procedure: STERNAL PLATING;  Surgeon: Melrose Nakayama, MD;  Location: [place];  Service: Thoracic;  Laterality: N/A;  . right shoulder    . STERNAL WIRES REMOVAL N/A 03/30/2016   Procedure: STERNAL WIRES REMOVAL;  Surgeon: Melrose Nakayama, MD;  Location: [place];  Service: Thoracic;  Laterality: N/A;  . TONSILLECTOMY      MEDICATIONS: I have reviewed all medications and confirmed regimen as documented  Social History   Socioeconomic History  . Marital status: Married    Spouse name: Not on file  . Number of children: Not on file  . Years of education: Not on file  . Highest education level: Not on file  Social Needs  . Financial resource strain: Not on file  . Food insecurity - worry: Not on file  . Food insecurity - inability: Not on file  . Transportation needs - medical: Not on file  . Transportation needs - non-medical: Not on file  Occupational History  . Not on file  Tobacco Use  . Smoking status: Former Smoker    Packs/day: 1.00    Years: 35.00    Pack years: 35.00    Types:  Cigarettes    Start date: 05/02/2009  . Smokeless tobacco: Never Used  . Tobacco comment: quit in 2006  Substance and Sexual Activity  . Alcohol use: No  . Drug use: No  . Sexual activity: Yes  Other Topics Concern  . Not on file  Social History Narrative  . Not on file    Family History  Problem Relation Age of Onset  . Heart attack Father 49       MI  . Cancer Maternal Uncle     ROS: No fever, myalgias/arthralgias, unexplained weight loss or weight gain No new focal weakness or sensory deficits No otalgia, hearing loss, visual changes, nasal and sinus symptoms, mouth and throat problems No neck pain or adenopathy No abdominal pain, N/V/D, diarrhea, change in bowel pattern No dysuria, change in urinary pattern   Vitals:   02/16/17 1017  BP: 100/62  Pulse: (!) 49  SpO2: 99%  Weight: 79.4 kg (  175 lb)  Height: 5\' 10"  (1.778 m)  Room air    EXAM:  Gen: WDWN, No overt respiratory distress HEENT: NCAT, sclera white, oropharynx normal Neck: Supple without LAN, thyromegaly, JVD Lungs: breath sounds full without wheezes or other adventitious sounds Cardiovascular: Bradycardia, regular, no murmurs noted Abdomen: Soft, nontender, normal BS Ext: without clubbing, cyanosis, edema Neuro: CNs grossly intact, motor and sensory intact Skin: Limited exam, no lesions noted  DATA:   BMP Latest Ref Rng & Units 01/26/2017 01/22/2017 11/20/2016  Glucose 65 - 99 mg/dL 104(H) 108(H) 92  BUN 6 - 20 mg/dL 21(H) 20 16  Creatinine 0.61 - 1.24 mg/dL 1.06 1.30(H) 1.21  BUN/Creat Ratio 10 - 24 - - -  Sodium 135 - 145 mmol/L 137 138 139  Potassium 3.5 - 5.1 mmol/L 4.8 4.4 4.1  Chloride 101 - 111 mmol/L 103 103 108  CO2 22 - 32 mmol/L 27 30 28   Calcium 8.9 - 10.3 mg/dL 9.0 8.8(L) 8.5(L)    CBC Latest Ref Rng & Units 01/26/2017 01/26/2017 01/22/2017  WBC 3.8 - 10.6 K/uL 5.0 4.4 5.8  Hemoglobin 13.0 - 18.0 g/dL 15.0 14.4 15.0  Hematocrit 40.0 - 52.0 % 44.7 43.6 45.0  Platelets 150 - 440  K/uL 212 205 223    CXR 01/22/17: No acute cardiac or pulmonary findings  IMPRESSION:     ICD-10-CM   1. DOE (dyspnea on exertion) R06.09 Pulmonary Function Test [facility] Only    6 minute walk  2. 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 History of pulmonary embolism Z86.711   4. Former smoker Z87.891    The cause of his exertional dyspnea is unclear.  Day-to-day variation suggests the possibility of airways disease with hyperreactivity.  However, it would be unusual to develop this 12 years after quitting smoking.  He has a prior history of pulmonary embolism but is fully anticoagulated and it therefore, recurrence is exceedingly unlikely.  Further, echocardiogram reveals no evidence of pulmonary hypertension to suggest chronic thromboembolic disease.  The only significant finding on exam is bradycardia.  Much of his symptoms could be attributed to chronotropic insufficiency.  He is on a low-dose of carvedilol.  The dosage of this medication has been decreased recently.  He is on a Spiriva inhaler.  Is not clear to me who prescribed this medication.  He is unable to tell whether it has benefited him with regards to exertional dyspnea.  PLAN:  Discontinue carvedilol (Coreg) I instructed that he may try on and off Spiriva inhaler to see if it makes any difference in DOE  One week trial on, one week off, etc. Full pulmonary function tests have been ordered 6-minute walk test has been ordered.  In particular, I am interested in assessing his heart rate response to ambulation  Follow-up in 1 month   Merton Border, MD PCCM service Mobile [phone] Pager [phone] 02/23/2017 4:47 PM

## 2017-03-01 ENCOUNTER — Other Ambulatory Visit: Payer: Self-pay | Admitting: Cardiovascular Disease

## 2017-03-16 ENCOUNTER — Ambulatory Visit
Admission: RE | Admit: 2017-03-16 | Discharge: 2017-03-16 | Disposition: A | Discharge status: Home / Self Care | Payer: Medicare HMO | Source: Ambulatory Visit | Attending: Pulmonary Disease | Admitting: Pulmonary Disease

## 2017-03-16 ENCOUNTER — Encounter: Payer: Self-pay | Admitting: Pulmonary Disease

## 2017-03-16 ENCOUNTER — Ambulatory Visit: Payer: Medicare HMO | Admitting: Pulmonary Disease

## 2017-03-16 VITALS — BP 122/88 | HR 75 | Ht 70.0 in | Wt 174.0 lb

## 2017-03-16 DIAGNOSIS — R079 Chest pain, unspecified: Secondary | ICD-10-CM | POA: Diagnosis not present

## 2017-03-16 DIAGNOSIS — R0609 Other forms of dyspnea: Secondary | ICD-10-CM

## 2017-03-16 DIAGNOSIS — R042 Hemoptysis: Secondary | ICD-10-CM

## 2017-03-16 DIAGNOSIS — J209 Acute bronchitis, unspecified: Secondary | ICD-10-CM | POA: Diagnosis not present

## 2017-03-16 MED ORDER — DOXYCYCLINE HYCLATE 100 MG PO TABS: 100.0000 mg | ORAL_TABLET | Freq: Two times a day (BID) | ORAL | 0 refills | Status: AC

## 2017-03-16 MED ORDER — ZOLPIDEM TARTRATE 5 MG PO TABS: 5.0000 mg | ORAL_TABLET | Freq: Every evening | ORAL | 0 refills | Status: DC | PRN

## 2017-03-16 NOTE — Patient Instructions (Addendum)
Lung function tests are nearly normal and do not explain shortness of breath For evaluation of hemoptysis (coughing blood), I have ordered a chest x-ray For treatment of hemoptysis, I have ordered doxycycline (and antibiotic) I will discuss further with Dr. Rockey Situ Follow-up as needed

## 2017-03-16 NOTE — Progress Notes (Signed)
PULMONARY OFFICE FOLLOW UP NOTE  Requesting MD/Service: Rockey Situ Date of initial consultation: 02/16/17 Reason for consultation: DOE  PT PROFILE: 77 y.o. 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  Mild mitral regurgitation.  Left atrium mildly dilated.  Right-sided pressures normal. Myoview 11/08/16: No significant ischemia. Fixed defect in the basal inferolateral wall. Hypokinesis in the basal inferior wall, EF estimated at 49% LHC 12/04/16: Significant underlying three-vessel coronary artery disease with patent stents in the LAD and right coronary artery with only mild in-stent restenosis.  Patent LIMA to LAD but the native LAD is mostly filling via antegrade flow in the native vessel.  Patent SVG to ramus or high diagonal.  Chronically occluded mid left circumflex with collaterals from the ramus.  No significant change in coronary anatomy since most recent cardiac catheterization. Mildly to moderately reduced LV systolic function with an EF of 40%.  Normal left ventricular end-diastolic pressure 6MWT 64/33/29: 291 meters. No desaturation. Had chest pain before and during test PFTs 02/22/17: No obstruction.  Lung volumes low normal range.  Diffusion capacity normal.   SUBJ: This is a routine reevaluation for continued investigation into unexplained exertional dyspnea.  He is here to review results of the 6-minute walk test and PFTs performed on 02/22/17.  He has no new complaints.  Because of bradycardia and lightheadedness, I recommended that he discontinue his (low-dose) carvedilol last visit.  He states that he has less frequent and less severe lightheadedness.  Last visit, I recommended that he try off of Spiriva to discern whether it is of any benefit with regard to his exertional dyspnea.  He has tried off and on since  last visit and notices no difference either way.  He continues to have moderate exertional dyspnea and also describes chest pain/tightness.  He has nocturnal cough.  He denies orthopnea.  He has had 5 episodes of scant hemoptysis with mixed mucus in the past 2 weeks. Denies LE edema and calf tenderness.  Vitals:   03/16/17 0817 03/16/17 0821  BP:  122/88  Pulse:  75  SpO2:  100%  Weight: 78.9 kg (174 lb)   Height: 5\' 10"  (1.778 m)   Room air    EXAM:  Gen: NAD HEENT: NCAT, sclerae white, oropharynx normal Neck: No LAN or JVD noted Lungs: full BS, normal percussion note, no adventitious sounds Cardiovascular: RRR, no M noted Abdomen: Soft, NT, +BS Ext: no C/C/E Neuro: grossly intact  DATA:   BMP Latest Ref Rng & Units 01/26/2017 01/22/2017 11/20/2016  Glucose 65 - 99 mg/dL 104(H) 108(H) 92  BUN 6 - 20 mg/dL 21(H) 20 16  Creatinine 0.61 - 1.24 mg/dL 1.06 1.30(H) 1.21  BUN/Creat Ratio 10 - 24 - - -  Sodium 135 - 145 mmol/L 137 138 139  Potassium 3.5 - 5.1 mmol/L 4.8 4.4 4.1  Chloride 101 - 111 mmol/L 103 103 108  CO2 22 - 32 mmol/L 27 30 28   Calcium 8.9 - 10.3 mg/dL 9.0 8.8(L) 8.5(L)    CBC Latest Ref Rng & Units 01/26/2017 01/26/2017 01/22/2017  WBC 3.8 - 10.6 K/uL 5.0 4.4 5.8  Hemoglobin 13.0 - 18.0 g/dL 15.0 14.4 15.0  Hematocrit 40.0 - 52.0 % 44.7 43.6 45.0  Platelets 150 - 440 K/uL 212 205 223   PFTs and 6-minute walk test are documented above and have been reviewed by me  CXR 03/16/17: No acute cardiac or pulmonary findings  IMPRESSION:     ICD-10-CM   1. DOE (dyspnea on exertion) R06.09   2. Hemoptysis R04.2 DG Chest 2 View  3. Acute bronchitis, unspecified organism J20.9   4. Chest pain, unspecified type R07.9    At this point, there is no evidence of a pulmonary etiology to his shortness of breath.  Given his known history of coronary disease and report of exertional chest pain, I remain concerned that his shortness of breath is on the basis of a cardiac  etiology.  However, he has undergone a thorough cardiac evaluation recently and there was no definitive explanation for shortness of breath determined by that evaluation.  His acute symptoms of cough, mucus and scant hemoptysis likely represent acute bronchitis.  PLAN:  I explained to him in detail that I could not find a clear pulmonary etiology for his shortness of breath and that it is my impression that most likely is DOE is on the basis of cardiac disease or deconditioning.  Chest x-ray was ordered on this day to evaluate hemoptysis and is reviewed above.  For presumed bronchitis, I have ordered doxycycline 100 mg twice a day for 5 days.  I will discuss his case further with Dr. Rockey Situ.  If further diagnostic testing is felt to be warranted, would consider cardiopulmonary stress testing in [facility]  Follow-up with [place] as needed or as deemed necessary by his other care provider   Merton Border, MD PCCM service Mobile [phone] Pager [phone] 03/18/2017 3:02 PM

## 2017-03-18 ENCOUNTER — Encounter: Payer: Self-pay | Admitting: Pulmonary Disease

## 2017-03-20 ENCOUNTER — Emergency Department
Admission: EM | Admit: 2017-03-20 | Discharge: 2017-03-20 | Disposition: A | Discharge status: Home / Self Care | Payer: Medicare HMO | Attending: Emergency Medicine | Admitting: Emergency Medicine

## 2017-03-20 ENCOUNTER — Encounter: Payer: Self-pay | Admitting: Intensive Care

## 2017-03-20 ENCOUNTER — Emergency Department: Payer: Medicare HMO

## 2017-03-20 DIAGNOSIS — I5022 Chronic systolic (congestive) heart failure: Secondary | ICD-10-CM | POA: Diagnosis not present

## 2017-03-20 DIAGNOSIS — I2510 Atherosclerotic heart disease of native coronary artery without angina pectoris: Secondary | ICD-10-CM | POA: Insufficient documentation

## 2017-03-20 DIAGNOSIS — I252 Old myocardial infarction: Secondary | ICD-10-CM | POA: Insufficient documentation

## 2017-03-20 DIAGNOSIS — R079 Chest pain, unspecified: Secondary | ICD-10-CM

## 2017-03-20 DIAGNOSIS — I110 Hypertensive heart disease with heart failure: Secondary | ICD-10-CM | POA: Insufficient documentation

## 2017-03-20 DIAGNOSIS — R0602 Shortness of breath: Secondary | ICD-10-CM | POA: Diagnosis not present

## 2017-03-20 DIAGNOSIS — R69 Illness, unspecified: Secondary | ICD-10-CM | POA: Diagnosis not present

## 2017-03-20 DIAGNOSIS — R0789 Other chest pain: Secondary | ICD-10-CM | POA: Diagnosis not present

## 2017-03-20 DIAGNOSIS — Z87891 Personal history of nicotine dependence: Secondary | ICD-10-CM | POA: Insufficient documentation

## 2017-03-20 DIAGNOSIS — F419 Anxiety disorder, unspecified: Secondary | ICD-10-CM | POA: Diagnosis not present

## 2017-03-20 LAB — BASIC METABOLIC PANEL
Anion gap: 8 (ref 5–15)
BUN: 17 mg/dL (ref 6–20)
CALCIUM: 8.7 mg/dL — AB (ref 8.9–10.3)
CHLORIDE: 106 mmol/L (ref 101–111)
CO2: 24 mmol/L (ref 22–32)
CREATININE: 1.18 mg/dL (ref 0.61–1.24)
GFR calc Af Amer: >60 mL/min (ref >60)
GFR calc non Af Amer: 59 mL/min — ABNORMAL LOW (ref >60)
Glucose, Bld: 118 mg/dL — ABNORMAL HIGH (ref 65–99)
Potassium: 3.9 mmol/L (ref 3.5–5.1)
Sodium: 138 mmol/L (ref 135–145)

## 2017-03-20 LAB — CBC
HCT: 46.2 % (ref 40.0–52.0)
Hemoglobin: 15.5 g/dL (ref 13.0–18.0)
MCH: 31.1 pg (ref 26.0–34.0)
MCHC: 33.5 g/dL (ref 32.0–36.0)
MCV: 92.7 fL (ref 80.0–100.0)
PLATELETS: 199 10*3/uL (ref 150–440)
RBC: 4.98 MIL/uL (ref 4.40–5.90)
RDW: 14.9 % — ABNORMAL HIGH (ref 11.5–14.5)
WBC: 3.5 10*3/uL — ABNORMAL LOW (ref 3.8–10.6)

## 2017-03-20 LAB — TROPONIN I

## 2017-03-20 MED ORDER — NITROGLYCERIN 0.4 MG SL SUBL
0.4000 mg | SUBLINGUAL_TABLET | Freq: Once | SUBLINGUAL | Status: AC
  Administered 2017-03-20: 0.4 mg via SUBLINGUAL
  Filled 2017-03-20: qty 1

## 2017-03-20 MED ORDER — LORAZEPAM 1 MG PO TABS: 1.0000 mg | ORAL_TABLET | Freq: Three times a day (TID) | ORAL | 0 refills | Status: DC | PRN

## 2017-03-20 NOTE — ED Notes (Signed)
Pt reports chest pain has returned, pt clenching with episodes of pain.  Repeat EKG performed.  EDP notified.

## 2017-03-20 NOTE — ED Provider Notes (Signed)
[facility] Emergency Department Provider Note       Time seen: ----------------------------------------- 8:42 AM on 03/20/2017 -----------------------------------------   I have reviewed the triage vital signs and the nursing notes.  HISTORY   Chief Complaint Chest Pain    HPI Scott [name] is a 77 y.o. male with a history of arthritis, coronary artery disease, paroxysmal atrial fibrillation, ischemic cardiomyopathy, CHF and hyperlipidemia who presents to the ED for chest pain that radiates to his left shoulder and shortness of breath for the past 2 days.  He states he has chronic chest pain but this feels different and he is having increased shortness of breath.  Patient was seen by the pulmonologist recently for shortness of breath and it was thought that his shortness of breath was coming more from his heart than it was his lungs.  Past Medical History:  Diagnosis Date  . Arthritis    right hip, since a fall  . CAD (coronary artery disease)    a. 04/2012 Cath: 3VD->Med Rx;  b. 04/2013 PCI RCA (2 DES); c. 03/2014 PCI: LAD 80 (3.0x23 Xience Alpine DES), 30 ISR in RCA; d. 06/2014 CABG x 2 ([facility]) LIMA->LAD, VG->OM; e. 12/2014 Cath ([facility]): patent grafts->Med Rx; f. 08/2015 Cath: patent grafts; g. 11/2016 Cath: LM min irregs, LAD 20p, 90m, LCX 100p/m, RCA 20ost, 10p/m/d, VG->OM1 nl, LIMA->dLAD nl.  . Cardiomyopathy, ischemic    a. 04/2012 Echo: EF 40-45%;  b. 08/2013 Echo: EF 45-50%, mild glob HK, mod lat/post HK, diast dysfxn, mildly dil LA, mild MR, nl RVSP; c. 01/2015 Echo: EF 40-45%, Gr 1 DD, mildly dil LA; 07/2016 Echo: EF 60-65%, no rwma, mild MR, mildly dil LA.  . Carotid arterial disease ([place])    a. 05/2013 Carotid U/S; bilat 40-50% ICA stenosis.  . Chronic Chest Pain   . Chronic systolic CHF (congestive heart failure) ([place])    a. 01/2015 Echo: EF 40-45%; b. 07/2016 Echo: EF 60-65%.  . Cough   . Dyspnea    pt. unsure of exertion cause- that creates SOB at times    . Headache 1970's   migraine  . History of blood transfusion 2016   post op  . Hyperlipidemia   . Hypertension   . Iron deficiency anemia due to chronic blood loss 09/11/2016  . Myocardial infarction [facility])    unsure of when  . Pulmonary emboli ([place]) 07/2016   a. On Xarelto.  . Sternal pain    a. 03/2016 s/p Sternal wire removal; b. 05/2016 s/p redo median sternotomy for sternal debridement and sternal plating.    Patient Active Problem List   Diagnosis Date Noted  . Syncope 11/19/2016  . 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 Iron deficiency anemia due to chronic blood loss 09/11/2016  . Pulmonary embolus ([place]) 08/31/2016  . Panic disorder   . 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 Panic attack 08/04/2016  . 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 Coronary artery disease involving native coronary artery of native heart with angina pectoris with documented spasm ([place])   . Hx of CABG   . Ischemic cardiomyopathy   . Chronic systolic CHF (congestive heart failure) ([place])   . Chronic Chest Pain   . Coronary artery disease involving coronary bypass graft of native heart with angina pectoris with documented spasm ([place])   . Angina pectoris ([place])   . Anxiety   . Hyperlipemia   . Chronic diastolic heart failure ([place]) 12/21/2014  . Unstable angina ([place]) 12/08/2014  .  PAF (paroxysmal atrial fibrillation) ([place]) 12/08/2014  . Chronic anticoagulation 12/08/2014  . Anemia 04/20/2014  . 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 Cardiomyopathy, ischemic   . HTN (hypertension)   . Carotid arterial disease ([place])   . S/P drug eluting coronary stent placement 05/30/2013  . Chest pain with low risk for cardiac etiology 05/05/2013  . SOB (shortness of breath) 05/05/2013  . Coronary artery disease involving coronary bypass graft of native heart with angina pectoris ([place]) 05/05/2013  . Hyperlipidemia 05/05/2013  . History of smoking 05/05/2013  . Cardiac angina ([place]) 05/05/2013    Past  Surgical History:  Procedure Laterality Date  . CARDIAC CATHETERIZATION  05/01/2012  . CARDIAC CATHETERIZATION  04/2013   [facility]  . CARDIAC CATHETERIZATION  01/11/15    [facility]  . CARDIAC CATHETERIZATION N/A 09/16/2015   Procedure: LEFT HEART CATH AND CORS/GRAFTS ANGIOGRAPHY;  Surgeon: Minna Merritts, MD;  Location: [place] CV LAB;  Service: Cardiovascular;  Laterality: N/A;  . CARPAL TUNNEL RELEASE     right hand  . CATARACT EXTRACTION     LEFT  . CATARACT EXTRACTION W/PHACO Left 09/16/2014   Procedure: CATARACT EXTRACTION PHACO AND INTRAOCULAR LENS PLACEMENT (IOC);  Surgeon: Leandrew Koyanagi, MD;  Location: [place];  Service: Ophthalmology;  Laterality: Left;  . COLONOSCOPY    . COLONOSCOPY WITH PROPOFOL N/A 01/28/2016   Procedure: COLONOSCOPY WITH PROPOFOL;  Surgeon: Lollie Sails, MD;  Location: [facility] ENDOSCOPY;  Service: Endoscopy;  Laterality: N/A;  . CORONARY ANGIOPLASTY WITH STENT PLACEMENT  04/13/2014  . CORONARY ARTERY BYPASS GRAFT  06-26-14   x3 bypasses  . DE QUERVAIN'S RELEASE Left 08/22/2012  . ESOPHAGOGASTRODUODENOSCOPY (EGD) WITH PROPOFOL N/A 04/26/2015   Procedure: ESOPHAGOGASTRODUODENOSCOPY (EGD) WITH PROPOFOL;  Surgeon: Hulen Luster, MD;  Location: [facility] ENDOSCOPY;  Service: Gastroenterology;  Laterality: N/A;  . LEFT HEART CATH AND CORONARY ANGIOGRAPHY N/A 12/04/2016   Procedure: LEFT HEART CATH AND CORS/GRAFTS  ANGIOGRAPHY;  Surgeon: Wellington Hampshire, MD;  Location: [place] CV LAB;  Service: Cardiovascular;  Laterality: N/A;  . RIB PLATING N/A 05/25/2016   Procedure: STERNAL PLATING;  Surgeon: Melrose Nakayama, MD;  Location: [place];  Service: Thoracic;  Laterality: N/A;  . right shoulder    . STERNAL WIRES REMOVAL N/A 03/30/2016   Procedure: STERNAL WIRES REMOVAL;  Surgeon: Melrose Nakayama, MD;  Location: [place];  Service: Thoracic;  Laterality: N/A;  . TONSILLECTOMY      Allergies Contrast media [iodinated diagnostic agents]; Oxycodone  hcl; and Vicodin [hydrocodone-acetaminophen]  Social History Social History   Tobacco Use  . Smoking status: Former Smoker    Packs/day: 1.00    Years: 35.00    Pack years: 35.00    Types: Cigarettes    Start date: 05/02/2009  . Smokeless tobacco: Never Used  . Tobacco comment: quit in 2006  Substance Use Topics  . Alcohol use: No  . Drug use: No    Review of Systems Constitutional: Negative for fever. Cardiovascular: Positive for chest pain Respiratory: Positive for shortness of breath Gastrointestinal: Negative for abdominal pain, vomiting and diarrhea. Genitourinary: Negative for dysuria. Musculoskeletal: Negative for back pain. Skin: Negative for rash. Neurological: Negative for headaches, focal weakness or numbness.  All systems negative/normal/unremarkable except as stated in the HPI  ____________________________________________   PHYSICAL EXAM:  VITAL SIGNS: ED Triage Vitals  Enc Vitals Group     BP 03/20/17 0835 134/71     Pulse Rate 03/20/17 0835 75  Resp 03/20/17 0835 18     Temp 03/20/17 0835 98.2 F (36.8 C)     Temp Source 03/20/17 0835 Oral     SpO2 03/20/17 0835 98 %     Weight 03/20/17 0832 174 lb (78.9 kg)     Height 03/20/17 0832 5\' 10"  (1.778 m)     Head Circumference --      Peak Flow --      Pain Score 03/20/17 0832 6     Pain Loc --      Pain Edu? --      Excl. in [place]? --    Constitutional: Alert and oriented. Well appearing and in no distress. Eyes: Conjunctivae are normal. Normal extraocular movements. ENT   Head: Normocephalic and atraumatic.   Nose: No congestion/rhinnorhea.   Mouth/Throat: Mucous membranes are moist.   Neck: No stridor. Cardiovascular: Normal rate, regular rhythm. No murmurs, rubs, or gallops. Respiratory: Normal respiratory effort without tachypnea nor retractions. Breath sounds are clear and equal bilaterally. No wheezes/rales/rhonchi. Gastrointestinal: Soft and nontender. Normal bowel  sounds Musculoskeletal: Nontender with normal range of motion in extremities. No lower extremity tenderness nor edema. Neurologic:  Normal speech and language. No gross focal neurologic deficits are appreciated.  Skin:  Skin is warm, dry and intact. No rash noted. Psychiatric: Mood and affect are normal. Speech and behavior are normal.  ____________________________________________  EKG: Interpreted by me.  Sinus rhythm the rate 72 bpm, prolonged PR interval, wide QRS, normal QT.  Repeat EKG interpreted by me, sinus rhythm with baseline artifact, leftward axis, wide QRS, prolonged PR interval ____________________________________________  ED COURSE:  As part of my medical decision making, I reviewed the following data within the [place] History obtained from family if available, nursing notes, old chart and ekg, as well as notes from prior ED visits. Patient presented for chest pain or shortness of breath, we will assess with labs and imaging as indicated at this time.   Procedures ____________________________________________   LABS (pertinent positives/negatives)  Labs Reviewed  BASIC METABOLIC PANEL - Abnormal; Notable for the following components:      Result Value   Glucose, Bld 118 (*)    Calcium 8.7 (*)    GFR calc non Af Amer 59 (*)    All other components within normal limits  CBC - Abnormal; Notable for the following components:   WBC 3.5 (*)    RDW 14.9 (*)    All other components within normal limits  TROPONIN I    RADIOLOGY Images were viewed by me  Chest x-ray IMPRESSION: No active cardiopulmonary disease. ____________________________________________  DIFFERENTIAL DIAGNOSIS   Unstable angina, PE, MI, dissection, pneumothorax, cardiomyopathy, CHF  FINAL ASSESSMENT AND PLAN  Chest pain, shortness of breath   Plan: Patient had presented for chest pain shortness of breath. Patient's labs were negative. Patient's imaging is also  negative.  No clear etiology for chest pain or shortness of breath.  I did discuss with his cardiologist who agrees with outpatient follow-up.   Laurence Aly, MD   Note: This note was generated in part or whole with voice recognition software. Voice recognition is usually quite accurate but there are transcription errors that can and very often do occur. I apologize for any typographical errors that were not detected and corrected.     Earleen Newport, MD 03/20/17 1215

## 2017-03-20 NOTE — ED Notes (Signed)
EDP to bedside. 

## 2017-03-20 NOTE — ED Notes (Signed)
Patient transported to X-ray 

## 2017-03-20 NOTE — ED Triage Notes (Addendum)
Pt c/o central chest pain that radiates to L shoulder and SOB X2 days. HX CABG. Can talk in complete sentences with no distress. Patient takes blood thinners daily

## 2017-03-20 NOTE — Progress Notes (Signed)
Left voicemail message to call back to discuss cardiac rehab.

## 2017-03-26 ENCOUNTER — Other Ambulatory Visit: Payer: Self-pay | Admitting: Gastroenterology

## 2017-03-26 DIAGNOSIS — I2510 Atherosclerotic heart disease of native coronary artery without angina pectoris: Secondary | ICD-10-CM | POA: Diagnosis not present

## 2017-03-26 DIAGNOSIS — R079 Chest pain, unspecified: Secondary | ICD-10-CM | POA: Diagnosis not present

## 2017-03-26 DIAGNOSIS — Z0000 Encounter for general adult medical examination without abnormal findings: Secondary | ICD-10-CM | POA: Diagnosis not present

## 2017-03-26 DIAGNOSIS — Z86711 Personal history of pulmonary embolism: Secondary | ICD-10-CM | POA: Diagnosis not present

## 2017-03-26 DIAGNOSIS — D509 Iron deficiency anemia, unspecified: Secondary | ICD-10-CM | POA: Diagnosis not present

## 2017-03-26 DIAGNOSIS — R1013 Epigastric pain: Secondary | ICD-10-CM | POA: Diagnosis not present

## 2017-03-26 DIAGNOSIS — I10 Essential (primary) hypertension: Secondary | ICD-10-CM | POA: Diagnosis not present

## 2017-03-26 DIAGNOSIS — E782 Mixed hyperlipidemia: Secondary | ICD-10-CM | POA: Diagnosis not present

## 2017-03-26 DIAGNOSIS — R1310 Dysphagia, unspecified: Secondary | ICD-10-CM

## 2017-03-26 DIAGNOSIS — I480 Paroxysmal atrial fibrillation: Secondary | ICD-10-CM | POA: Diagnosis not present

## 2017-03-28 DIAGNOSIS — I2510 Atherosclerotic heart disease of native coronary artery without angina pectoris: Secondary | ICD-10-CM | POA: Diagnosis not present

## 2017-03-28 DIAGNOSIS — I5032 Chronic diastolic (congestive) heart failure: Secondary | ICD-10-CM | POA: Diagnosis not present

## 2017-03-28 DIAGNOSIS — Z951 Presence of aortocoronary bypass graft: Secondary | ICD-10-CM | POA: Diagnosis not present

## 2017-03-29 ENCOUNTER — Ambulatory Visit
Admission: RE | Admit: 2017-03-29 | Discharge: 2017-03-29 | Disposition: A | Discharge status: Home / Self Care | Payer: Medicare HMO | Source: Ambulatory Visit | Attending: Gastroenterology | Admitting: Gastroenterology

## 2017-03-29 ENCOUNTER — Other Ambulatory Visit: Payer: Self-pay | Admitting: Gastroenterology

## 2017-03-29 DIAGNOSIS — R1084 Generalized abdominal pain: Secondary | ICD-10-CM | POA: Diagnosis not present

## 2017-03-29 DIAGNOSIS — R140 Abdominal distension (gaseous): Secondary | ICD-10-CM

## 2017-03-30 ENCOUNTER — Other Ambulatory Visit: Payer: Self-pay | Admitting: Gastroenterology

## 2017-03-30 DIAGNOSIS — R1011 Right upper quadrant pain: Secondary | ICD-10-CM

## 2017-04-02 ENCOUNTER — Telehealth: Payer: Self-pay | Admitting: Cardiovascular Disease

## 2017-04-02 NOTE — Telephone Encounter (Signed)
Lmov for patient to call back and schedule apportionment with Dr Rockey Situ Were seen in ED on 03/20/17  Will try again at a later time

## 2017-04-03 ENCOUNTER — Ambulatory Visit
Admission: RE | Admit: 2017-04-03 | Discharge: 2017-04-03 | Disposition: A | Discharge status: Home / Self Care | Payer: Medicare HMO | Source: Ambulatory Visit | Attending: Gastroenterology | Admitting: Gastroenterology

## 2017-04-03 DIAGNOSIS — R1011 Right upper quadrant pain: Secondary | ICD-10-CM | POA: Insufficient documentation

## 2017-04-03 MED ORDER — IOPAMIDOL (ISOVUE-300) INJECTION 61%
100.0000 mL | Freq: Once | INTRAVENOUS | Status: AC | PRN
  Administered 2017-04-03: 100 mL via INTRAVENOUS

## 2017-04-03 MED ORDER — DIPHENHYDRAMINE HCL 50 MG/ML IJ SOLN
INTRAMUSCULAR | Status: AC
  Administered 2017-04-03: 50 mg via INTRAVENOUS
  Filled 2017-04-03: qty 1

## 2017-04-03 MED ORDER — DIPHENHYDRAMINE HCL 50 MG/ML IJ SOLN
50.0000 mg | Freq: Once | INTRAMUSCULAR | Status: AC
  Administered 2017-04-03: 50 mg via INTRAVENOUS
  Filled 2017-04-03: qty 1

## 2017-04-03 MED ORDER — SODIUM CHLORIDE FLUSH 0.9 % IV SOLN
INTRAVENOUS | Status: AC
  Administered 2017-04-03: 10 mL
  Filled 2017-04-03: qty 20

## 2017-04-03 NOTE — OR Nursing (Signed)
Pt reports he wasn't told to take Benadryl po pre CT, despite contrast medium allergy, He did take his steroid. Marguerite Olea PA gave verbal order to pre medicate with Benadryl 50 mg IV. Pt tolerated well. Encouraged to have wife drive home.

## 2017-04-04 ENCOUNTER — Other Ambulatory Visit: Payer: Self-pay | Admitting: Cardiovascular Disease

## 2017-04-04 NOTE — Telephone Encounter (Signed)
Refill Request.  

## 2017-04-04 NOTE — Telephone Encounter (Signed)
Lmov for patient to call back and schedule apportionment with Dr Rockey Situ Were seen in ED on 03/20/17  Will try again at a later time

## 2017-04-05 ENCOUNTER — Ambulatory Visit
Admission: RE | Admit: 2017-04-05 | Discharge: 2017-04-05 | Disposition: A | Discharge status: Home / Self Care | Payer: Medicare HMO | Source: Ambulatory Visit | Attending: Gastroenterology | Admitting: Gastroenterology

## 2017-04-05 ENCOUNTER — Encounter
Admission: RE | Admit: 2017-04-05 | Discharge: 2017-04-05 | Disposition: A | Discharge status: Home / Self Care | Payer: Medicare HMO | Source: Ambulatory Visit | Attending: Gastroenterology | Admitting: Gastroenterology

## 2017-04-05 DIAGNOSIS — R1013 Epigastric pain: Secondary | ICD-10-CM | POA: Insufficient documentation

## 2017-04-05 DIAGNOSIS — R1310 Dysphagia, unspecified: Secondary | ICD-10-CM | POA: Diagnosis not present

## 2017-04-05 DIAGNOSIS — R079 Chest pain, unspecified: Secondary | ICD-10-CM

## 2017-04-05 DIAGNOSIS — K8689 Other specified diseases of pancreas: Secondary | ICD-10-CM | POA: Insufficient documentation

## 2017-04-05 MED ORDER — TECHNETIUM TC 99M MEBROFENIN IV KIT
5.0000 | PACK | Freq: Once | INTRAVENOUS | Status: AC | PRN
  Administered 2017-04-05: 5.12 via INTRAVENOUS

## 2017-04-10 ENCOUNTER — Encounter: Payer: Self-pay | Admitting: Cardiovascular Disease

## 2017-04-10 NOTE — Telephone Encounter (Signed)
Sent letter

## 2017-04-11 ENCOUNTER — Other Ambulatory Visit: Payer: Self-pay | Admitting: Gastroenterology

## 2017-04-11 DIAGNOSIS — K838 Other specified diseases of biliary tract: Secondary | ICD-10-CM

## 2017-04-11 DIAGNOSIS — R935 Abnormal findings on diagnostic imaging of other abdominal regions, including retroperitoneum: Secondary | ICD-10-CM

## 2017-04-19 ENCOUNTER — Ambulatory Visit
Admission: RE | Admit: 2017-04-19 | Discharge: 2017-04-19 | Disposition: A | Discharge status: Home / Self Care | Payer: Medicare HMO | Source: Ambulatory Visit | Attending: Gastroenterology | Admitting: Gastroenterology

## 2017-04-19 ENCOUNTER — Other Ambulatory Visit: Payer: Self-pay | Admitting: Gastroenterology

## 2017-04-19 DIAGNOSIS — R935 Abnormal findings on diagnostic imaging of other abdominal regions, including retroperitoneum: Secondary | ICD-10-CM

## 2017-04-19 DIAGNOSIS — K838 Other specified diseases of biliary tract: Secondary | ICD-10-CM

## 2017-04-19 DIAGNOSIS — R932 Abnormal findings on diagnostic imaging of liver and biliary tract: Secondary | ICD-10-CM | POA: Diagnosis not present

## 2017-04-19 MED ORDER — GADOBENATE DIMEGLUMINE 529 MG/ML IV SOLN
15.0000 mL | Freq: Once | INTRAVENOUS | Status: AC | PRN
  Administered 2017-04-19: 15 mL via INTRAVENOUS

## 2017-04-25 NOTE — Progress Notes (Signed)
Cardiology Office Note  Date:  04/26/2017   ID:  Scott [name], DOB [birth year], MRN [id number]  PCP:  Baxter Hire, MD   Chief Complaint  Patient presents with  . other    Pt. was at [facility] ER in Feb. 2019 with chest pain and shortness of breath. Meds reviewed by the pt. verbally. Pt. c/o dizziness & chest pain with having to take NTG tablets.      HPI:  Scott [name] is a 77 year old gentleman with history of  Chronic chest pain syndrome/anxiety/panic attacks CAD, stenting CABG 2 with LIMA to the LAD 06/2014, vein graft to the OM at [facility],  chronic chest pain not relieved with bypass surgery,  Redo median sternotomy for sternal plating  on 05/25/2016 EF 40 to 45% 01/2015 Smoker, age 60 to 59 quit 2005 Pulmonary embolism July 2018, workup through hematology Postoperative atrial fibrillation at [facility] June 2016 following CABG who presents for follow-up of his anxiety and chronic stable angina pectoralis  In follow-up today reports having episodes of chest pain 3 severe episodes presenting typically at rest relieved with nitroglycerin Otherwise has been active, pulling up field rocks putting him in his Gator to make a garden Walked 1 mile the other day, sometimes more with no chest pain symptoms Planning cruise to [place] August 2019  Reports that he had pain on palpation underneath right ribs was sent for right upper quadrant ultrasound leading to additional scanning  Reports feeling dizzy today Does not drink much Orthostatics done today blood pressure dropped 129 supine down to 114 standing with no significant recovery after 3 minutes  EKG personally reviewed by myself on todays visit Shows normal sinus rhythm rate 67 bpm old inferior MI, consider old anterior MI  Notes reviewed for history as below ER visits Chest pain shortness of breath 03/20/17 Dizziness 01/26/2017 Chest pain 01/22/2017 Mental status change/anxiety  11/19/2016 Chest pain  11/07/2016 Chest pain   09/11/2016 SOB  08/04/2016 Chest pain 02/12/2016  Cath  12/04/2016 Cath 09/16/2015 Cath 01/11/2015  bypass surgery June 2016 at [facility]  Cath 05/29/2014, 50% LAD lesion, distal RCA disease Cath 04/13/2014, DES to the mid LAD Cath 05/16/2013,  stent to the RCA  Stress test 11/08/2016 Stress test 06/15/2015 Stress test 04/21/2015 Stress test 04/08/2014 Stress test 07/29/2012 Stress test 04/22/2012  March 2019 Numerous imaging studies of his abdomen, ultrasound, MRCP, All appeared essentially benign  hospitalization November 07, 2016 for chest pain Stress test showed no ischemia  Despite this he had cardiac catheterization 12/04/2016 for chest pain Medical management recommended  hospital November 19, 2016  Had involuntary leg movements, Described as myoclonus and mental status changes  CT scan of the head, was normal MRI performed, seen by neurology Showed sinusitis on MR  Felt to have possible panic attacks EEG unremarkable Zoloft was increased He was told not to drive by neurology for 1 month given acute onset of anxiety attack  Referred from pain clinic in [place] to [place] surg for consult Bone scan:diffuse increased uptake in the manubrium and upper sternum sternal wire removal on 03/30/2016. Redo median sternotomy for sternal plating by Dr. Roxan Hockey on 05/25/2016. lung densely adherent to the underside of the sternum and there was a small laceration of the left upper lobe  history of CAD dating back to April 2014 at which time he was found to have severe distal RCA disease with left to right collaterals, a subtotally occluded left circumflex, and moderate, diffuse LAD disease. He was initially medically  managed.   In April 2015, he underwent repeat catheterization secondary to ongoing chest pain, showing stable moderate to severe disease. He then underwent successful RCA stenting.   In March 2016, due to recurrent chest pain, he underwent successful stenting of the LAD.     he had recurrent chest pain again in June 2016 and was seen at [facility].  He underwent CABG 2 with a LIMA to the LAD and a vein graft to the obtuse marginal.   following CABG, he continued to have persistent/chronic low-level left-sided and midsternal chest pain.  He had repeat hospitalizations  with stress testing in November at [facility], which was negative,  catheterization at [facility] in December 2016, which apparently showed patent grafts.  Lab work in the hospital  showing total cholesterol 142, LDL 77, HDL 47  Echocardiogram 03/02/2014 showing normal LV function, essentially normal study  Echocardiogram 04/22/2012 showing ejection fraction 40%, no mention of wall motion abnormality, mild MR, TR  PMH:   has a past medical history of Arthritis, CAD (coronary artery disease), Cardiomyopathy, ischemic, Carotid arterial disease ([place]), Chronic Chest Pain, Chronic systolic CHF (congestive heart failure) ([place]), Cough, Dyspnea, Headache (1970's), History of blood transfusion (2016), Hyperlipidemia, Hypertension, Iron deficiency anemia due to chronic blood loss (09/11/2016), Myocardial infarction [facility]), Pulmonary emboli ([place]) (07/2016), and Sternal pain.  PSH:    Past Surgical History:  Procedure Laterality Date  . CARDIAC CATHETERIZATION  05/01/2012  . CARDIAC CATHETERIZATION  04/2013   [facility]  . CARDIAC CATHETERIZATION  01/11/15    [facility]  . CARDIAC CATHETERIZATION N/A 09/16/2015   Procedure: LEFT HEART CATH AND CORS/GRAFTS ANGIOGRAPHY;  Surgeon: Minna Merritts, MD;  Location: [place] CV LAB;  Service: Cardiovascular;  Laterality: N/A;  . CARPAL TUNNEL RELEASE     right hand  . CATARACT EXTRACTION     LEFT  . CATARACT EXTRACTION W/PHACO Left 09/16/2014   Procedure: CATARACT EXTRACTION PHACO AND INTRAOCULAR LENS PLACEMENT (IOC);  Surgeon: Leandrew Koyanagi, MD;  Location: [place];  Service: Ophthalmology;  Laterality: Left;  . COLONOSCOPY    . COLONOSCOPY WITH PROPOFOL N/A  01/28/2016   Procedure: COLONOSCOPY WITH PROPOFOL;  Surgeon: Lollie Sails, MD;  Location: [facility] ENDOSCOPY;  Service: Endoscopy;  Laterality: N/A;  . CORONARY ANGIOPLASTY WITH STENT PLACEMENT  04/13/2014  . CORONARY ARTERY BYPASS GRAFT  06-26-14   x3 bypasses  . DE QUERVAIN'S RELEASE Left 08/22/2012  . ESOPHAGOGASTRODUODENOSCOPY (EGD) WITH PROPOFOL N/A 04/26/2015   Procedure: ESOPHAGOGASTRODUODENOSCOPY (EGD) WITH PROPOFOL;  Surgeon: Hulen Luster, MD;  Location: [facility] ENDOSCOPY;  Service: Gastroenterology;  Laterality: N/A;  . LEFT HEART CATH AND CORONARY ANGIOGRAPHY N/A 12/04/2016   Procedure: LEFT HEART CATH AND CORS/GRAFTS  ANGIOGRAPHY;  Surgeon: Wellington Hampshire, MD;  Location: [place] CV LAB;  Service: Cardiovascular;  Laterality: N/A;  . RIB PLATING N/A 05/25/2016   Procedure: STERNAL PLATING;  Surgeon: Melrose Nakayama, MD;  Location: [place];  Service: Thoracic;  Laterality: N/A;  . right shoulder    . STERNAL WIRES REMOVAL N/A 03/30/2016   Procedure: STERNAL WIRES REMOVAL;  Surgeon: Melrose Nakayama, MD;  [facility] OR;  Service: Thoracic;  Laterality: N/A;  . TONSILLECTOMY      Current Outpatient Medications  Medication Sig Dispense Refill  . atorvastatin (LIPITOR) 80 MG tablet TAKE ONE TABLET AT BED- TIME. 90 tablet 3  . carvedilol (COREG) 6.25 MG tablet Take 6.25 mg by mouth 2 (two) times daily with a meal.    .  isosorbide mononitrate (IMDUR) 60 MG 24 hr tablet Take 60 mg by mouth 2 (two) times daily.     Marland Kitchen LORazepam (ATIVAN) 1 MG tablet Take 1 tablet (1 mg total) by mouth every 8 (eight) hours as needed for anxiety. 30 tablet 0  . nitroGLYCERIN (NITROSTAT) 0.4 MG SL tablet Place 1 tablet (0.4 mg total) under the tongue every 5 (five) minutes as needed for chest pain. 25 tablet 6  . traMADol (ULTRAM) 50 MG tablet Take 1-2 tablets (50-100 mg total) by mouth every 6 (six) hours as needed for moderate pain. (Patient taking differently: Take 50 mg at bedtime as needed by mouth for  moderate pain. ) 30 tablet 0  . zolpidem (AMBIEN) 5 MG tablet Take 1 tablet (5 mg total) by mouth at bedtime as needed for sleep. 30 tablet 0  . aspirin EC 81 MG tablet Take 1 tablet (81 mg total) by mouth daily. 90 tablet 3  . rivaroxaban (XARELTO) 10 MG TABS tablet Take 1 tablet (10 mg total) by mouth daily. 90 tablet 3   No current facility-administered medications for this visit.      Allergies:   Contrast media [iodinated diagnostic agents]; Oxycodone hcl; and Vicodin [hydrocodone-acetaminophen]   Social History:  The patient  reports that he has quit smoking. His smoking use included cigarettes. He started smoking about 7 years ago. He has a 35.00 pack-year smoking history. He has never used smokeless tobacco. He reports that he does not drink alcohol or use drugs.   Family History:   family history includes Cancer in his maternal uncle; Heart attack (age of onset: 88) in his father.    Review of Systems: Review of Systems  Cardiovascular: Positive for chest pain.  Gastrointestinal: Negative.   Musculoskeletal: Negative.   Neurological: Positive for dizziness.  Psychiatric/Behavioral: Negative.   All other systems reviewed and are negative.    PHYSICAL EXAM: VS:  BP 104/60 (BP Location: Left Arm, Patient Position: Sitting, Cuff Size: Normal)   Pulse 67   Ht 5\' 10"  (1.778 m)   Wt 176 lb 4 oz (79.9 kg)   BMI 25.29 kg/m  , BMI Body mass index is 25.29 kg/m. Constitutional:  oriented to person, place, and time. No distress.  HENT:  Head: Normocephalic and atraumatic.  Eyes:  no discharge. No scleral icterus.  Neck: Normal range of motion. Neck supple. No JVD present.  Cardiovascular: Normal rate, regular rhythm, normal heart sounds and intact distal pulses. Exam reveals no gallop and no friction rub. No edema No murmur heard. Pulmonary/Chest: Effort normal and breath sounds normal. No stridor. No respiratory distress.  no wheezes.  no rales.  no tenderness.  Abdominal:  Soft.  no distension.  no tenderness.  Musculoskeletal: Normal range of motion.  no  tenderness or deformity.  Neurological:  normal muscle tone. Coordination normal. No atrophy Skin: Skin is warm and dry. No rash noted. not diaphoretic.  Psychiatric:  normal mood and affect. behavior is normal. Thought content normal.   Recent Labs: 11/07/2016: Magnesium 1.8 11/19/2016: TSH 3.714 11/20/2016: ALT 17 03/20/2017: BUN 17; Creatinine, Ser 1.18; Hemoglobin 15.5; Platelets 199; Potassium 3.9; Sodium 138    Lipid Panel Lab Results  Component Value Date   CHOL 103 11/07/2016   HDL 41 11/07/2016   LDLCALC 56 11/07/2016   TRIG 31 11/07/2016      Wt Readings from Last 3 Encounters:  04/26/17 176 lb 4 oz (79.9 kg)  03/20/17 174 lb (78.9 kg)  03/16/17 174  lb (78.9 kg)     ASSESSMENT AND PLAN:  Chest pain, unspecified chest pain type -  Long history of chest pain Frequent cardiac catheterizations, stress testing Medical management recommended for recurrent pain symptoms Recommended he continue to take nitroglycerin as needed for chest pain symptoms Anxiety/panic likely playing a role in his presentations  Coronary artery disease involving native coronary artery of native heart  Medical management as detailed above Long history of repeated stress test and catheterizations for recurrent symptoms  Previously seen by psychiatry and neurology for anxiety  Hyperlipidemia Cholesterol is at goal on the current lipid regimen. No changes to the medications were made.  Stable We did discuss his weight which is up 10 pounds  Dizziness Dizziness is a chronic issue dating back several years Reports he does not drink much at all If symptoms persist will need to decrease his medications Does not seem to limit his activities  Shortness of breath 45 years of smoking, likely with COPD Reports walking without severe symptoms  Pulmonary embolism Completed 6 months of therapy on  Xarelto Presumably from DVT though etiology unclear  Postop atrial fibrillation Noted back in June 2016 No documented atrial fibrillation since that time For now will continue on Xarelto 20 daily  Outside records reviewed from [facility] encounter time more than 45 minutes  Greater than 50% was spent in counseling and coordination of care with the patient  Disposition:   F/U  6 month   Orders Placed This Encounter  Procedures  . EKG 12-Lead     Signed, Esmond Plants, M.D., Ph.D. 04/26/2017  [place], [place]

## 2017-04-26 ENCOUNTER — Telehealth: Payer: Self-pay | Admitting: Cardiovascular Disease

## 2017-04-26 ENCOUNTER — Ambulatory Visit: Payer: Medicare HMO | Admitting: Cardiovascular Disease

## 2017-04-26 ENCOUNTER — Encounter: Payer: Self-pay | Admitting: Cardiovascular Disease

## 2017-04-26 VITALS — BP 104/60 | HR 67 | Ht 70.0 in | Wt 176.2 lb

## 2017-04-26 DIAGNOSIS — I739 Peripheral vascular disease, unspecified: Secondary | ICD-10-CM

## 2017-04-26 DIAGNOSIS — I25709 Atherosclerosis of coronary artery bypass graft(s), unspecified, with unspecified angina pectoris: Secondary | ICD-10-CM | POA: Diagnosis not present

## 2017-04-26 DIAGNOSIS — I480 Paroxysmal atrial fibrillation: Secondary | ICD-10-CM

## 2017-04-26 DIAGNOSIS — I779 Disorder of arteries and arterioles, unspecified: Secondary | ICD-10-CM | POA: Diagnosis not present

## 2017-04-26 DIAGNOSIS — R0602 Shortness of breath: Secondary | ICD-10-CM | POA: Diagnosis not present

## 2017-04-26 DIAGNOSIS — I255 Ischemic cardiomyopathy: Secondary | ICD-10-CM

## 2017-04-26 DIAGNOSIS — I10 Essential (primary) hypertension: Secondary | ICD-10-CM | POA: Diagnosis not present

## 2017-04-26 DIAGNOSIS — E782 Mixed hyperlipidemia: Secondary | ICD-10-CM | POA: Diagnosis not present

## 2017-04-26 MED ORDER — RIVAROXABAN 20 MG PO TABS: 20.0000 mg | ORAL_TABLET | Freq: Every day | ORAL | 3 refills | Status: DC

## 2017-04-26 MED ORDER — RIVAROXABAN 10 MG PO TABS: 10.0000 mg | ORAL_TABLET | Freq: Every day | ORAL | 3 refills | Status: DC

## 2017-04-26 MED ORDER — NITROGLYCERIN 0.4 MG SL SUBL: 0.4000 mg | SUBLINGUAL_TABLET | SUBLINGUAL | 6 refills | Status: DC | PRN

## 2017-04-26 MED ORDER — ASPIRIN EC 81 MG PO TBEC: 81.0000 mg | DELAYED_RELEASE_TABLET | Freq: Every day | ORAL | 3 refills | Status: DC

## 2017-04-26 NOTE — Patient Instructions (Addendum)
Medication Instructions:  Your physician has recommended you make the following change in your medication:  1. START Aspirin 81 mg once daily 2. DECREASE Xarelto to 10 mg once daily   Labwork:  No new labs needed  Testing/Procedures:  No further testing at this time   Follow-Up: It was a pleasure seeing you in the office today. Please call us if you have new issues that need to be addressed before your next appt.  [phone]  Your physician wants you to follow-up in: 6  months.  You will receive a reminder letter in the mail two months in advance. If you don't receive a letter, please call our office to schedule the follow-up appointment.  If you need a refill on your cardiac medications before your next appointment, please call your pharmacy.  For educational health videos Log in to : [email or web address] Or : [email or web address], password : [email or web address]

## 2017-04-26 NOTE — Telephone Encounter (Signed)
Spoke with patient and advised that Dr. Rockey Situ reviewed data and recommends that he stay on the xarelto 20 mg with no aspirin. He verbalized understanding with no further questions at this time.

## 2017-04-27 ENCOUNTER — Ambulatory Visit: Payer: Medicare HMO | Admitting: Oncology

## 2017-04-27 ENCOUNTER — Other Ambulatory Visit: Payer: Medicare HMO

## 2017-04-27 ENCOUNTER — Telehealth: Payer: Self-pay | Admitting: Cardiovascular Disease

## 2017-04-27 NOTE — Telephone Encounter (Signed)
Please call to confirm  Or fax clarification of rx for xarelto 10 mg vs 20 mg po q day

## 2017-04-27 NOTE — Telephone Encounter (Signed)
Spoke with the pharmacist at [facility] per Dr. Rockey Situ the patient is to take Xarelto 20 mg one tablet daily.

## 2017-05-03 DIAGNOSIS — R140 Abdominal distension (gaseous): Secondary | ICD-10-CM | POA: Diagnosis not present

## 2017-05-03 DIAGNOSIS — M940 Chondrocostal junction syndrome [Tietze]: Secondary | ICD-10-CM | POA: Diagnosis not present

## 2017-05-04 ENCOUNTER — Inpatient Hospital Stay (HOSPITAL_BASED_OUTPATIENT_CLINIC_OR_DEPARTMENT_OTHER): Payer: Medicare HMO | Admitting: Oncology

## 2017-05-04 ENCOUNTER — Encounter: Payer: Self-pay | Admitting: Oncology

## 2017-05-04 ENCOUNTER — Other Ambulatory Visit: Payer: Self-pay

## 2017-05-04 ENCOUNTER — Inpatient Hospital Stay: Payer: Medicare HMO | Attending: Oncology

## 2017-05-04 VITALS — BP 98/58 | HR 70 | Temp 97.8°F | Wt 174.4 lb

## 2017-05-04 DIAGNOSIS — I2699 Other pulmonary embolism without acute cor pulmonale: Secondary | ICD-10-CM

## 2017-05-04 DIAGNOSIS — Z7901 Long term (current) use of anticoagulants: Secondary | ICD-10-CM | POA: Diagnosis not present

## 2017-05-04 DIAGNOSIS — I480 Paroxysmal atrial fibrillation: Secondary | ICD-10-CM

## 2017-05-04 DIAGNOSIS — D500 Iron deficiency anemia secondary to blood loss (chronic): Secondary | ICD-10-CM

## 2017-05-04 DIAGNOSIS — D509 Iron deficiency anemia, unspecified: Secondary | ICD-10-CM

## 2017-05-04 LAB — CBC WITH DIFFERENTIAL/PLATELET
BASOS PCT: 1 %
Basophils Absolute: 0 10*3/uL (ref 0–0.1)
EOS ABS: 0 10*3/uL (ref 0–0.7)
EOS PCT: 1 %
HCT: 44.5 % (ref 40.0–52.0)
Hemoglobin: 15.3 g/dL (ref 13.0–18.0)
Lymphocytes Relative: 34 %
Lymphs Abs: 1.8 10*3/uL (ref 1.0–3.6)
MCH: 31.6 pg (ref 26.0–34.0)
MCHC: 34.4 g/dL (ref 32.0–36.0)
MCV: 91.9 fL (ref 80.0–100.0)
MONOS PCT: 8 %
Monocytes Absolute: 0.4 10*3/uL (ref 0.2–1.0)
NEUTROS PCT: 56 %
Neutro Abs: 3 10*3/uL (ref 1.4–6.5)
PLATELETS: 191 10*3/uL (ref 150–440)
RBC: 4.84 MIL/uL (ref 4.40–5.90)
RDW: 13.8 % (ref 11.5–14.5)
WBC: 5.2 10*3/uL (ref 3.8–10.6)

## 2017-05-04 LAB — FERRITIN: Ferritin: 40 ng/mL (ref 24–336)

## 2017-05-04 NOTE — Progress Notes (Signed)
Patient here today for follow up.   

## 2017-05-04 NOTE — Progress Notes (Signed)
[facility]  Follow Up Visit:  Patient Care Team: Baxter Hire, MD as PCP - General (Internal Medicine) Madelyn Brunner, MD (Internal Medicine) Alisa Graff, FNP as Nurse Practitioner (Cardiology) Minna Merritts, MD as Consulting Physician (Cardiology)  REASON FOR VISIT Follow up for treatment of iron deficiency anemia  HISTORY OF PRESENTING ILLNESS: Scott [name] 77 y.o. male with PMH listed as below who is referred to me for evaluation and management of pulmonary embolism.  Scott [name] has a history paroxymal atrial fibrillation on Eliquis 5mg  BID until recently, history of CAD s/p CABG x 2 at [facility] in 2016 with chronic chest pain, s/p removal of sternal wire on 03/2016,  s/p sternal debridement and plating on 05/25/2016 in the setting of CT chest performed by pain management showing sternal nonunion of the manubrium with subsequent improvement in his pain. Perioperatively his anticoagulation with Eliquis was discontinue a few days prior surgery and resumed after surgery. Operation note mentioned that during the procedure, the left lung was densely adherent to the underside of the sternum and tear in the lung did occur and was sutured. He presents  to [facility] on 08/04/2016 for evaluation of SOB and was found to have a PE. Patient states that he was compliant with Eliquis.  CTA chest showed left upper lobe PE extending into the left main pulmonary artery. His Eliquis was changed to Xarelto. Lower extremity dopplers were negative for DVT. Hypercoagulable workup was sent and all tests were negative except a mild low protein S function.  No formal cancer workup.  Sed rate normal, heavy metals negative, TSH mildly reduced at 0.253. 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     Hypercoagulable work up: discussed with patient. He is tested negative for factor 5 leiden mutation, prothrombin mutation, anti  phospholipin antibody panels. Normal protein C activity. Normal total protein S, mildly low protein S function, can be falsely low in the acute setting of thrombosis.Congential protein S deficiency is unlikely given that he has no prior history of VTE. Repeat protein S total and free were normal.   # He has had one dose of Feraheme and during the treatment he had a chest tightness and end of infusion. Has received IV iron Venofer and tolerates well.   INTERVAL HISTORY Patient presents for follow-up of management of chronic anticoagulation, iron deficiency anemia. He has ongoing fatigue, and SOB with exertion.  This afternoon he reports mild dizziness which spontaneously resolved. He has chronic chest pain and takes nitroglycerin as needed for symptoms. Anxiety/panic also plays a role.   .  Review of Systems  Constitutional: Negative for fatigue.  HENT:   Negative for mouth sores and nosebleeds.   Eyes: Negative for eye problems and icterus.  Respiratory: Positive for shortness of breath.        SOB with exertion.   Cardiovascular: Negative for chest pain.       Chronic Intermittent chest pain.  Gastrointestinal: Negative for abdominal distention and abdominal pain.  Endocrine: Negative for hot flashes.  Genitourinary: Negative for dysuria and hematuria.   Musculoskeletal: Negative for arthralgias and back pain.  Skin: Negative for itching.  Neurological: Negative for dizziness and headaches.  Hematological: Negative for adenopathy.  Psychiatric/Behavioral: Negative for confusion. The patient is not nervous/anxious.     MEDICAL HISTORY: Past Medical History:  Diagnosis Date  . Arthritis    right hip,  since a fall  . CAD (coronary artery disease)    a. 04/2012 Cath: 3VD->Med Rx;  b. 04/2013 PCI RCA (2 DES); c. 03/2014 PCI: LAD 80 (3.0x23 Xience Alpine DES), 30 ISR in RCA; d. 06/2014 CABG x 2 ([facility]) LIMA->LAD, VG->OM; e. 12/2014 Cath ([facility]): patent grafts->Med Rx; f. 08/2015 Cath: patent  grafts; g. 11/2016 Cath: LM min irregs, LAD 20p, 27m, LCX 100p/m, RCA 20ost, 10p/m/d, VG->OM1 nl, LIMA->dLAD nl.  . Cardiomyopathy, ischemic    a. 04/2012 Echo: EF 40-45%;  b. 08/2013 Echo: EF 45-50%, mild glob HK, mod lat/post HK, diast dysfxn, mildly dil LA, mild MR, nl RVSP; c. 01/2015 Echo: EF 40-45%, Gr 1 DD, mildly dil LA; 07/2016 Echo: EF 60-65%, no rwma, mild MR, mildly dil LA.  . Carotid arterial disease ([place])    a. 05/2013 Carotid U/S; bilat 40-50% ICA stenosis.  . Chronic Chest Pain   . Chronic systolic CHF (congestive heart failure) ([place])    a. 01/2015 Echo: EF 40-45%; b. 07/2016 Echo: EF 60-65%.  . Cough   . Dyspnea    pt. unsure of exertion cause- that creates SOB at times   . Headache 1970's   migraine  . History of blood transfusion 2016   post op  . Hyperlipidemia   . Hypertension   . Iron deficiency anemia due to chronic blood loss 09/11/2016  . Myocardial infarction [facility])    unsure of when  . Pulmonary emboli ([place]) 07/2016   a. On Xarelto.  . Sternal pain    a. 03/2016 s/p Sternal wire removal; b. 05/2016 s/p redo median sternotomy for sternal debridement and sternal plating.    SURGICAL HISTORY: Past Surgical History:  Procedure Laterality Date  . CARDIAC CATHETERIZATION  05/01/2012  . CARDIAC CATHETERIZATION  04/2013   [facility];x3 stent  . CARDIAC CATHETERIZATION  01/11/15    [facility]  . CARDIAC CATHETERIZATION N/A 09/16/2015   Procedure: LEFT HEART CATH AND CORS/GRAFTS ANGIOGRAPHY;  Surgeon: Minna Merritts, MD;  Location: [place] CV LAB;  Service: Cardiovascular;  Laterality: N/A;  . CARPAL TUNNEL RELEASE     right hand  . CATARACT EXTRACTION     LEFT  . CATARACT EXTRACTION W/PHACO Left 09/16/2014   Procedure: CATARACT EXTRACTION PHACO AND INTRAOCULAR LENS PLACEMENT (IOC);  Surgeon: Leandrew Koyanagi, MD;  Location: [place];  Service: Ophthalmology;  Laterality: Left;  . COLONOSCOPY    . COLONOSCOPY WITH PROPOFOL N/A 01/28/2016   Procedure: COLONOSCOPY  WITH PROPOFOL;  Surgeon: Lollie Sails, MD;  Location: [facility] ENDOSCOPY;  Service: Endoscopy;  Laterality: N/A;  . CORONARY ANGIOPLASTY WITH STENT PLACEMENT  04/13/2014  . CORONARY ARTERY BYPASS GRAFT  06-26-14   x3 bypasses  . DE QUERVAIN'S RELEASE Left 08/22/2012  . ESOPHAGOGASTRODUODENOSCOPY (EGD) WITH PROPOFOL N/A 04/26/2015   Procedure: ESOPHAGOGASTRODUODENOSCOPY (EGD) WITH PROPOFOL;  Surgeon: Hulen Luster, MD;  Location: [facility] ENDOSCOPY;  Service: Gastroenterology;  Laterality: N/A;  . LEFT HEART CATH AND CORONARY ANGIOGRAPHY N/A 12/04/2016   Procedure: LEFT HEART CATH AND CORS/GRAFTS  ANGIOGRAPHY;  Surgeon: Wellington Hampshire, MD;  Location: [place] CV LAB;  Service: Cardiovascular;  Laterality: N/A;  . RIB PLATING N/A 05/25/2016   Procedure: STERNAL PLATING;  Surgeon: Melrose Nakayama, MD;  Location: [place];  Service: Thoracic;  Laterality: N/A;  . right shoulder    . STERNAL WIRES REMOVAL N/A 03/30/2016   Procedure: STERNAL WIRES REMOVAL;  Surgeon: Melrose Nakayama, MD;  Location: [place];  Service: Thoracic;  Laterality: N/A;  .  TONSILLECTOMY      SOCIAL HISTORY: Social History   Socioeconomic History  . Marital status: Married    Spouse name: Not on file  . Number of children: Not on file  . Years of education: Not on file  . Highest education level: Not on file  Occupational History  . Not on file  Social Needs  . Financial resource strain: Not on file  . Food insecurity:    Worry: Not on file    Inability: Not on file  . Transportation needs:    Medical: Not on file    Non-medical: Not on file  Tobacco Use  . Smoking status: Former Smoker    Packs/day: 1.00    Years: 35.00    Pack years: 35.00    Types: Cigarettes    Start date: 05/02/2009  . Smokeless tobacco: Never Used  . Tobacco comment: quit in 2006  Substance and Sexual Activity  . Alcohol use: No  . Drug use: No  . Sexual activity: Yes  Lifestyle  . Physical activity:    Days per week: Not on file     Minutes per session: Not on file  . Stress: Not on file  Relationships  . Social connections:    Talks on phone: Not on file    Gets together: Not on file    Attends religious service: Not on file    Active member of club or organization: Not on file    Attends meetings of clubs or organizations: Not on file    Relationship status: Not on file  . Intimate partner violence:    Fear of current or ex partner: Not on file    Emotionally abused: Not on file    Physically abused: Not on file    Forced sexual activity: Not on file  Other Topics Concern  . Not on file  Social History Narrative  . Not on file    FAMILY HISTORY Family History  Problem Relation Age of Onset  . Heart attack Father 75       MI  . Cancer Maternal Uncle     ALLERGIES:  is allergic to contrast media [iodinated diagnostic agents]; oxycodone hcl; and vicodin [hydrocodone-acetaminophen].  MEDICATIONS:  Current Outpatient Medications  Medication Sig Dispense Refill  . atorvastatin (LIPITOR) 80 MG tablet TAKE ONE TABLET AT BED- TIME. 90 tablet 3  . carvedilol (COREG) 6.25 MG tablet Take 6.25 mg by mouth 2 (two) times daily with a meal.    . isosorbide mononitrate (IMDUR) 60 MG 24 hr tablet Take 60 mg by mouth 2 (two) times daily.     Marland Kitchen LORazepam (ATIVAN) 1 MG tablet Take 1 tablet (1 mg total) by mouth every 8 (eight) hours as needed for anxiety. 30 tablet 0  . nitroGLYCERIN (NITROSTAT) 0.4 MG SL tablet Place 1 tablet (0.4 mg total) under the tongue every 5 (five) minutes as needed for chest pain. 25 tablet 6  . rivaroxaban (XARELTO) 20 MG TABS tablet Take 1 tablet (20 mg total) by mouth daily. 90 tablet 3  . traMADol (ULTRAM) 50 MG tablet Take 1-2 tablets (50-100 mg total) by mouth every 6 (six) hours as needed for moderate pain. (Patient taking differently: Take 50 mg at bedtime as needed by mouth for moderate pain. ) 30 tablet 0  . zolpidem (AMBIEN) 5 MG tablet Take 1 tablet (5 mg total) by mouth at bedtime  as needed for sleep. 30 tablet 0   No current facility-administered medications for  this visit.     PHYSICAL EXAMINATION:  ECOG PERFORMANCE STATUS: 1 - Symptomatic but completely ambulatory   Vitals:   05/04/17 1543  BP: (!) 98/58  Pulse: 70  Temp: 97.8 F (36.6 C)    Filed Weights   05/04/17 1543  Weight: 174 lb 7 oz (79.1 kg)     Physical Exam  Constitutional: He is oriented to person, place, and time and well-developed, well-nourished, and in no distress. No distress.  HENT:  Head: Normocephalic and atraumatic.  Mouth/Throat: No oropharyngeal exudate.  Eyes: Pupils are equal, round, and reactive to light. Conjunctivae and EOM are normal.  Neck: Normal range of motion. Neck supple.  Cardiovascular: Normal rate and regular rhythm.  Pulmonary/Chest: Effort normal and breath sounds normal. No respiratory distress.  Abdominal: Soft. Bowel sounds are normal.  Musculoskeletal: He exhibits no edema.  Neurological: He is alert and oriented to person, place, and time.  Skin: Skin is warm and dry.  Psychiatric: Affect and judgment normal.        LABORATORY DATA: I have personally reviewed the data as listed:  Appointment on 05/04/2017  Component Date Value Ref Range Status  . WBC 05/04/2017 5.2  3.8 - 10.6 K/uL Final  . RBC 05/04/2017 4.84  4.40 - 5.90 MIL/uL Final  . Hemoglobin 05/04/2017 15.3  13.0 - 18.0 g/dL Final  . HCT 05/04/2017 44.5  40.0 - 52.0 % Final  . MCV 05/04/2017 91.9  80.0 - 100.0 fL Final  . MCH 05/04/2017 31.6  26.0 - 34.0 pg Final  . MCHC 05/04/2017 34.4  32.0 - 36.0 g/dL Final  . RDW 05/04/2017 13.8  11.5 - 14.5 % Final  . Platelets 05/04/2017 191  150 - 440 K/uL Final  . Neutrophils Relative % 05/04/2017 56  % Final  . Neutro Abs 05/04/2017 3.0  1.4 - 6.5 K/uL Final  . Lymphocytes Relative 05/04/2017 34  % Final  . Lymphs Abs 05/04/2017 1.8  1.0 - 3.6 K/uL Final  . Monocytes Relative 05/04/2017 8  % Final  . Monocytes Absolute 05/04/2017 0.4   0.2 - 1.0 K/uL Final  . Eosinophils Relative 05/04/2017 1  % Final  . Eosinophils Absolute 05/04/2017 0.0  0 - 0.7 K/uL Final  . Basophils Relative 05/04/2017 1  % Final  . Basophils Absolute 05/04/2017 0.0  0 - 0.1 K/uL Final   Performed at [facility], [address] [place], [place] [address]   Lab Results  Component Value Date   IRON 91 01/24/2017   TIBC 280 01/24/2017   IRONPCTSAT 33 01/24/2017   Lab Results  Component Value Date   FERRITIN 96 01/26/2017     RADIOGRAPHIC STUDIES: I have personally reviewed the radiological images as listed and agree with the findings in the report 08/08/2016 1. 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 No CT evidence of right heart straining. 2. Multi vessel coronary vascular calcification and CABG surgery changes.  01/28/2016 colonoscopy showed diverticulosis of sigmoid colon and distant descending colon. 04/26/2015 EGD normal.  ASSESSMENT/PLAN 1. Chronic anticoagulation   2. Iron deficiency anemia due to chronic blood loss   # Hemoglobin is normal and stable #s/p Venofer 200mg . ferritin gradually decreased, last colonoscopy in January 2018 which showed benign polyps. Upper endoscopy was  In April 2017 was normal. Recommend patient to follow up with gastroenterology for further work up of possible blood loss from GI tract.  I will  give patient IV venofer 200mg  weekly x 2. After that he can take oral ferrous sulfate supplements.   # Follow up with me in 6 months with  CBC, iron TIBC, ferritin repeat prior to the visit.   All questions were answered. The patient knows to call the clinic with any problems, questions or concerns.  Earlie Server, MD, PhD Hematology Oncology [facility] at [facility] Pager- [phone] 05/04/2017

## 2017-05-05 MED ORDER — FERROUS SULFATE 325 (65 FE) MG PO TBEC: 325.0000 mg | DELAYED_RELEASE_TABLET | Freq: Two times a day (BID) | ORAL | 3 refills | Status: DC

## 2017-05-05 NOTE — Addendum Note (Signed)
Addended by: Earlie Server on: 05/05/2017 11:37 PM   Modules accepted: Orders

## 2017-05-07 ENCOUNTER — Telehealth: Payer: Self-pay | Admitting: Oncology

## 2017-05-07 NOTE — Telephone Encounter (Signed)
Schd and conf with wife Inez Catalina, for IV VENOFER weekly x 2, per Julie/schd msg. Appt dates are 05/10/17 and 05/17/17 @ 2pm.

## 2017-05-10 ENCOUNTER — Inpatient Hospital Stay: Payer: Medicare HMO

## 2017-05-10 VITALS — BP 114/68 | HR 67 | Temp 97.6°F | Resp 20

## 2017-05-10 DIAGNOSIS — D500 Iron deficiency anemia secondary to blood loss (chronic): Secondary | ICD-10-CM

## 2017-05-10 DIAGNOSIS — D509 Iron deficiency anemia, unspecified: Secondary | ICD-10-CM | POA: Diagnosis not present

## 2017-05-10 MED ORDER — IRON SUCROSE 20 MG/ML IV SOLN
200.0000 mg | Freq: Once | INTRAVENOUS | Status: AC
  Administered 2017-05-10: 200 mg via INTRAVENOUS
  Filled 2017-05-10: qty 10

## 2017-05-10 MED ORDER — SODIUM CHLORIDE 0.9 % IV SOLN
Freq: Once | INTRAVENOUS | Status: AC
  Administered 2017-05-10: 14:00:00 via INTRAVENOUS
  Filled 2017-05-10: qty 1000

## 2017-05-11 ENCOUNTER — Other Ambulatory Visit: Payer: Self-pay | Admitting: Pulmonary Disease

## 2017-05-11 NOTE — Telephone Encounter (Signed)
Pt is requesting refill on Ambien 5mg . Rx last refilled on 03/16/17. Pt last OV 03/16/17 with no pending apt.  DS please advise. Thanks

## 2017-05-14 DIAGNOSIS — D649 Anemia, unspecified: Secondary | ICD-10-CM | POA: Diagnosis not present

## 2017-05-17 ENCOUNTER — Inpatient Hospital Stay: Payer: Medicare HMO

## 2017-05-17 VITALS — BP 117/71 | HR 69 | Resp 20

## 2017-05-17 DIAGNOSIS — D509 Iron deficiency anemia, unspecified: Secondary | ICD-10-CM | POA: Diagnosis not present

## 2017-05-17 DIAGNOSIS — D500 Iron deficiency anemia secondary to blood loss (chronic): Secondary | ICD-10-CM

## 2017-05-17 MED ORDER — SODIUM CHLORIDE 0.9 % IV SOLN
Freq: Once | INTRAVENOUS | Status: AC
  Administered 2017-05-17: 14:00:00 via INTRAVENOUS
  Filled 2017-05-17: qty 1000

## 2017-05-17 MED ORDER — IRON SUCROSE 20 MG/ML IV SOLN
200.0000 mg | Freq: Once | INTRAVENOUS | Status: AC
  Administered 2017-05-17: 200 mg via INTRAVENOUS
  Filled 2017-05-17: qty 10

## 2017-05-18 ENCOUNTER — Telehealth: Payer: Self-pay | Admitting: Cardiovascular Disease

## 2017-05-18 DIAGNOSIS — K649 Unspecified hemorrhoids: Secondary | ICD-10-CM | POA: Diagnosis not present

## 2017-05-18 DIAGNOSIS — R195 Other fecal abnormalities: Secondary | ICD-10-CM | POA: Diagnosis not present

## 2017-05-18 DIAGNOSIS — R1013 Epigastric pain: Secondary | ICD-10-CM | POA: Diagnosis not present

## 2017-05-18 NOTE — Telephone Encounter (Signed)
° °  [facility] Pre-operative Risk Assessment    Request for surgical clearance:  1. What type of surgery is being performed? EGD   2. When is this surgery scheduled? 07-13-17  3. What type of clearance is required (medical clearance vs. Pharmacy clearance to hold med vs. Both)? BOTH  4. Are there any medications that need to be held prior to surgery and how long? Xarelto   5. Practice name and name of physician performing surgery? [facility] Dr. Gustavo Lah   6. What is your office phone number [phone]   7.   What is your office fax number [phone]  8.   Anesthesia type (None, local, MAC, general) ? Monitored    Clarisse Gouge 05/18/2017, 10:42 AM  _________________________________________________________________   (provider comments below)

## 2017-05-20 NOTE — Telephone Encounter (Signed)
Acceptable risk for egd Would stop xarelto 2 days prior to procedure Restart when GI doctor approves  Could take asa 81 mg daily when not on xarelto

## 2017-05-21 NOTE — Telephone Encounter (Signed)
Clearance routed to number listed. 

## 2017-05-24 ENCOUNTER — Telehealth: Payer: Self-pay | Admitting: *Deleted

## 2017-05-24 DIAGNOSIS — D500 Iron deficiency anemia secondary to blood loss (chronic): Secondary | ICD-10-CM

## 2017-05-24 NOTE — Telephone Encounter (Signed)
Patient called stating he is "having trouble going and feel like my hemoglobin is down" Requesting to come in for lab check. Please advise  He is scheduled for Upper Endoscopy 07/13/17

## 2017-05-24 NOTE — Telephone Encounter (Signed)
Per VO Dr Tasia Catchings, CBC check. Patient accepted appointment tomorrow at 1145

## 2017-05-25 ENCOUNTER — Other Ambulatory Visit: Payer: Self-pay

## 2017-05-25 ENCOUNTER — Inpatient Hospital Stay: Payer: Medicare HMO | Attending: Oncology

## 2017-05-25 DIAGNOSIS — D509 Iron deficiency anemia, unspecified: Secondary | ICD-10-CM | POA: Insufficient documentation

## 2017-05-25 DIAGNOSIS — D500 Iron deficiency anemia secondary to blood loss (chronic): Secondary | ICD-10-CM

## 2017-05-25 LAB — CBC WITH DIFFERENTIAL/PLATELET
Basophils Absolute: 0 10*3/uL (ref 0–0.1)
Basophils Relative: 0 %
EOS ABS: 0 10*3/uL (ref 0–0.7)
EOS PCT: 1 %
HCT: 45.8 % (ref 40.0–52.0)
Hemoglobin: 15.8 g/dL (ref 13.0–18.0)
LYMPHS ABS: 1.7 10*3/uL (ref 1.0–3.6)
Lymphocytes Relative: 31 %
MCH: 31.9 pg (ref 26.0–34.0)
MCHC: 34.5 g/dL (ref 32.0–36.0)
MCV: 92.5 fL (ref 80.0–100.0)
MONOS PCT: 8 %
Monocytes Absolute: 0.5 10*3/uL (ref 0.2–1.0)
Neutro Abs: 3.3 10*3/uL (ref 1.4–6.5)
Neutrophils Relative %: 60 %
PLATELETS: 186 10*3/uL (ref 150–440)
RBC: 4.96 MIL/uL (ref 4.40–5.90)
RDW: 14 % (ref 11.5–14.5)
WBC: 5.6 10*3/uL (ref 3.8–10.6)

## 2017-05-28 ENCOUNTER — Telehealth: Payer: Self-pay | Admitting: *Deleted

## 2017-05-28 ENCOUNTER — Encounter: Payer: Self-pay | Admitting: *Deleted

## 2017-05-28 NOTE — Telephone Encounter (Signed)
Patient called for results from Friday, Advised patient everything was nml

## 2017-05-29 ENCOUNTER — Ambulatory Visit: Payer: Medicare HMO | Admitting: Certified Registered Nurse Anesthetist

## 2017-05-29 ENCOUNTER — Encounter: Payer: Self-pay | Admitting: *Deleted

## 2017-05-29 ENCOUNTER — Encounter
Admission: RE | Disposition: A | Discharge status: Home / Self Care | Payer: Self-pay | Source: Ambulatory Visit | Attending: Gastroenterology

## 2017-05-29 ENCOUNTER — Ambulatory Visit
Admission: RE | Admit: 2017-05-29 | Discharge: 2017-05-29 | Disposition: A | Discharge status: Home / Self Care | Payer: Medicare HMO | Source: Ambulatory Visit | Attending: Gastroenterology | Admitting: Gastroenterology

## 2017-05-29 DIAGNOSIS — Z79891 Long term (current) use of opiate analgesic: Secondary | ICD-10-CM | POA: Diagnosis not present

## 2017-05-29 DIAGNOSIS — Z87891 Personal history of nicotine dependence: Secondary | ICD-10-CM | POA: Diagnosis not present

## 2017-05-29 DIAGNOSIS — K319 Disease of stomach and duodenum, unspecified: Secondary | ICD-10-CM | POA: Insufficient documentation

## 2017-05-29 DIAGNOSIS — I255 Ischemic cardiomyopathy: Secondary | ICD-10-CM | POA: Insufficient documentation

## 2017-05-29 DIAGNOSIS — K3189 Other diseases of stomach and duodenum: Secondary | ICD-10-CM | POA: Diagnosis not present

## 2017-05-29 DIAGNOSIS — K210 Gastro-esophageal reflux disease with esophagitis: Secondary | ICD-10-CM | POA: Diagnosis not present

## 2017-05-29 DIAGNOSIS — Z955 Presence of coronary angioplasty implant and graft: Secondary | ICD-10-CM | POA: Insufficient documentation

## 2017-05-29 DIAGNOSIS — R140 Abdominal distension (gaseous): Secondary | ICD-10-CM | POA: Diagnosis not present

## 2017-05-29 DIAGNOSIS — Z86711 Personal history of pulmonary embolism: Secondary | ICD-10-CM | POA: Diagnosis not present

## 2017-05-29 DIAGNOSIS — I739 Peripheral vascular disease, unspecified: Secondary | ICD-10-CM | POA: Insufficient documentation

## 2017-05-29 DIAGNOSIS — R195 Other fecal abnormalities: Secondary | ICD-10-CM | POA: Insufficient documentation

## 2017-05-29 DIAGNOSIS — I252 Old myocardial infarction: Secondary | ICD-10-CM | POA: Insufficient documentation

## 2017-05-29 DIAGNOSIS — Z79899 Other long term (current) drug therapy: Secondary | ICD-10-CM | POA: Insufficient documentation

## 2017-05-29 DIAGNOSIS — K2940 Chronic atrophic gastritis without bleeding: Secondary | ICD-10-CM | POA: Diagnosis not present

## 2017-05-29 DIAGNOSIS — K2960 Other gastritis without bleeding: Secondary | ICD-10-CM | POA: Diagnosis not present

## 2017-05-29 DIAGNOSIS — Q458 Other specified congenital malformations of digestive system: Secondary | ICD-10-CM | POA: Diagnosis not present

## 2017-05-29 DIAGNOSIS — I5022 Chronic systolic (congestive) heart failure: Secondary | ICD-10-CM | POA: Diagnosis not present

## 2017-05-29 DIAGNOSIS — I110 Hypertensive heart disease with heart failure: Secondary | ICD-10-CM | POA: Insufficient documentation

## 2017-05-29 DIAGNOSIS — R1013 Epigastric pain: Secondary | ICD-10-CM | POA: Diagnosis not present

## 2017-05-29 DIAGNOSIS — R6881 Early satiety: Secondary | ICD-10-CM | POA: Insufficient documentation

## 2017-05-29 DIAGNOSIS — I2510 Atherosclerotic heart disease of native coronary artery without angina pectoris: Secondary | ICD-10-CM | POA: Diagnosis not present

## 2017-05-29 DIAGNOSIS — K228 Other specified diseases of esophagus: Secondary | ICD-10-CM | POA: Diagnosis not present

## 2017-05-29 DIAGNOSIS — Z7901 Long term (current) use of anticoagulants: Secondary | ICD-10-CM | POA: Diagnosis not present

## 2017-05-29 DIAGNOSIS — E785 Hyperlipidemia, unspecified: Secondary | ICD-10-CM | POA: Insufficient documentation

## 2017-05-29 DIAGNOSIS — Z951 Presence of aortocoronary bypass graft: Secondary | ICD-10-CM | POA: Diagnosis not present

## 2017-05-29 HISTORY — DX: Gastro-esophageal reflux disease with esophagitis: K21.0

## 2017-05-29 HISTORY — DX: Benign neoplasm of colon, unspecified: D12.6

## 2017-05-29 HISTORY — DX: Diverticulosis of intestine, part unspecified, without perforation or abscess without bleeding: K57.90

## 2017-05-29 HISTORY — PX: ESOPHAGOGASTRODUODENOSCOPY (EGD) WITH PROPOFOL: SHX5813

## 2017-05-29 HISTORY — DX: Gastro-esophageal reflux disease with esophagitis, without bleeding: K21.00

## 2017-05-29 SURGERY — ESOPHAGOGASTRODUODENOSCOPY (EGD) WITH PROPOFOL
Anesthesia: General

## 2017-05-29 MED ORDER — LIDOCAINE HCL (PF) 2 % IJ SOLN
INTRAMUSCULAR | Status: AC
  Filled 2017-05-29: qty 10

## 2017-05-29 MED ORDER — SODIUM CHLORIDE 0.9 % IV SOLN
INTRAVENOUS | Status: DC
  Administered 2017-05-29: 1000 mL via INTRAVENOUS

## 2017-05-29 MED ORDER — LIDOCAINE HCL (CARDIAC) PF 100 MG/5ML IV SOSY
PREFILLED_SYRINGE | INTRAVENOUS | Status: DC | PRN
  Administered 2017-05-29: 50 mg via INTRAVENOUS

## 2017-05-29 MED ORDER — PROPOFOL 500 MG/50ML IV EMUL
INTRAVENOUS | Status: AC
  Filled 2017-05-29: qty 50

## 2017-05-29 MED ORDER — PROPOFOL 500 MG/50ML IV EMUL
INTRAVENOUS | Status: DC | PRN
  Administered 2017-05-29: 130 ug/kg/min via INTRAVENOUS

## 2017-05-29 MED ORDER — PROPOFOL 10 MG/ML IV BOLUS
INTRAVENOUS | Status: DC | PRN
  Administered 2017-05-29: 20 mg via INTRAVENOUS
  Administered 2017-05-29: 70 mg via INTRAVENOUS

## 2017-05-29 NOTE — Anesthesia Post-op Follow-up Note (Signed)
Anesthesia QCDR form completed.        

## 2017-05-29 NOTE — Anesthesia Postprocedure Evaluation (Signed)
Anesthesia Post Note  Patient: Scott [name]  Procedure(s) Performed: ESOPHAGOGASTRODUODENOSCOPY (EGD) WITH PROPOFOL (N/A )  Patient location during evaluation: Endoscopy Anesthesia Type: General Level of consciousness: awake and alert Pain management: pain level controlled Vital Signs Assessment: post-procedure vital signs reviewed and stable Respiratory status: spontaneous breathing, nonlabored ventilation, respiratory function stable and patient connected to nasal cannula oxygen Cardiovascular status: blood pressure returned to baseline and stable Postop Assessment: no apparent nausea or vomiting Anesthetic complications: no     Last Vitals:  Vitals:   05/29/17 1446 05/29/17 1456  BP: 97/74 104/78  Pulse: 70 68  Resp: 17 18  Temp:    SpO2: 95% 97%    Last Pain:  Vitals:   05/29/17 1456  TempSrc:   PainSc: 0-No pain                 Eleftherios Dudenhoeffer S

## 2017-05-29 NOTE — H&P (Signed)
Outpatient short stay form Pre-procedure 05/29/2017 1:57 PM Scott Sails MD  Primary Physician: Scott Lemon MD  Reason for visit: EGD  History of present illness: Scott [name] is a 77 year old male presenting today as above.  He has personal history of a trace heme positive stool as well as problems with epigastric bloating and dyspepsia.  He has had an extensive evaluation and he does have a history of urinary artery bypass grafting.  Scott [name] complains of some early satiety and epigastric bloating.  He does not connect the symptoms to either medications or foods particularly.  However they also do not awaken him at night.  They are more prominent in the evening.  Scott [name] does take Xarelto was held that for over 48 hours.    Current Facility-Administered Medications:  .  0.9 %  sodium chloride infusion, , Intravenous, Continuous, Scott Sails, MD, Last Rate: 20 mL/hr at 05/29/17 1345, 1,000 mL at 05/29/17 1345  Medications Prior to Admission  Medication Sig Dispense Refill Last Dose  . atorvastatin (LIPITOR) 80 MG tablet TAKE ONE TABLET AT BED- TIME. 90 tablet 3 05/28/2017 at Unknown time  . carvedilol (COREG) 6.25 MG tablet Take 6.25 mg by mouth daily.    05/29/2017 at 700  . dicyclomine (BENTYL) 10 MG capsule Take 10 mg by mouth 4 (four) times daily -  before meals and at bedtime.   Past Week at Unknown time  . isosorbide mononitrate (IMDUR) 60 MG 24 hr tablet Take 60 mg by mouth 2 (two) times daily.    Past Week at Unknown time  . nitroGLYCERIN (NITROSTAT) 0.4 MG SL tablet Place 1 tablet (0.4 mg total) under the tongue every 5 (five) minutes as needed for chest pain. 25 tablet 6 Past Week at Unknown time  . pantoprazole (PROTONIX) 40 MG tablet Take 40 mg by mouth daily.   05/28/2017 at Unknown time  . rivaroxaban (XARELTO) 20 MG TABS tablet Take 1 tablet (20 mg total) by mouth daily. 90 tablet 3 Past Week at Unknown time  . traMADol (ULTRAM) 50 MG tablet Take 1-2 tablets (50-100 mg  total) by mouth every 6 (six) hours as needed for moderate pain. (Scott [name] taking differently: Take 50 mg at bedtime as needed by mouth for moderate pain. ) 30 tablet 0 Past Week at Unknown time  . zolpidem (AMBIEN) 5 MG tablet Take 1 tablet (5 mg total) by mouth at bedtime as needed for sleep. 30 tablet 0 Past Week at Unknown time  . albuterol (PROVENTIL HFA;VENTOLIN HFA) 108 (90 Base) MCG/ACT inhaler Inhale 2 puffs into the lungs every 6 (six) hours as needed for wheezing or shortness of breath.   Completed Course at Unknown time  . ferrous sulfate 325 (65 FE) MG EC tablet Take 1 tablet (325 mg total) by mouth 2 (two) times daily with a meal. (Scott [name] not taking: Reported on 05/29/2017) 60 tablet 3 Completed Course at Unknown time  . tiotropium (SPIRIVA) 18 MCG inhalation capsule Place 18 mcg into inhaler and inhale daily.   Completed Course at Unknown time     Allergies  Allergen Reactions  . Contrast Media [Iodinated Diagnostic Agents] Hives  . Metrizamide   . Oxycodone Hcl Itching  . Vicodin [Hydrocodone-Acetaminophen] Itching     Past Medical History:  Diagnosis Date  . Arthritis    right hip, since a fall  . CAD (coronary artery disease)    a. 04/2012 Cath: 3VD->Med Rx;  b. 04/2013 PCI RCA (2 DES); c. 03/2014 PCI: LAD  80 (3.0x23 Xience Alpine DES), 30 ISR in RCA; d. 06/2014 CABG x 2 ([facility]) LIMA->LAD, VG->OM; e. 12/2014 Cath ([facility]): patent grafts->Med Rx; f. 08/2015 Cath: patent grafts; g. 11/2016 Cath: LM min irregs, LAD 20p, 68m, LCX 100p/m, RCA 20ost, 10p/m/d, VG->OM1 nl, LIMA->dLAD nl.  . Cardiomyopathy, ischemic    a. 04/2012 Echo: EF 40-45%;  b. 08/2013 Echo: EF 45-50%, mild glob HK, mod lat/post HK, diast dysfxn, mildly dil LA, mild MR, nl RVSP; c. 01/2015 Echo: EF 40-45%, Gr 1 DD, mildly dil LA; 07/2016 Echo: EF 60-65%, no rwma, mild MR, mildly dil LA.  . Carotid arterial disease ([place])    a. 05/2013 Carotid U/S; bilat 40-50% ICA stenosis.  . Chronic Chest Pain   . Chronic systolic CHF  (congestive heart failure) ([place])    a. 01/2015 Echo: EF 40-45%; b. 07/2016 Echo: EF 60-65%.  . Cough   . Diverticulosis   . Dyspnea    pt. unsure of exertion cause- that creates SOB at times   . Headache 1970's   migraine  . History of blood transfusion 2016   post op  . Hyperlipidemia   . Hypertension   . Iron deficiency anemia due to chronic blood loss 09/11/2016  . Myocardial infarction [facility])    unsure of when  . Pulmonary emboli ([place]) 07/2016   a. On Xarelto.  . Reflux esophagitis   . Sternal pain    a. 03/2016 s/p Sternal wire removal; b. 05/2016 s/p redo median sternotomy for sternal debridement and sternal plating.  . Tubular adenoma of colon     Review of systems:      Physical Exam    Heart and lungs: Regular rate and rhythm without rub or gallop, lungs are bilaterally clear.    HEENT: normoCephalic atraumatic eyes are anicteric    Other:    Pertinant exam for procedure: Soft.  There is some tenderness on palpation in the upper epigastrium.  Is a very quick response to apparently a very short and sharp pain on even very light palpation in this area.    Planned proceedures: EGD and indicated procedures. I have discussed the risks benefits and complications of procedures to include not limited to bleeding, infection, perforation and the risk of sedation and the Scott [name] wishes to proceed.    Scott Sails, MD Gastroenterology 05/29/2017  1:57 PM

## 2017-05-29 NOTE — Op Note (Signed)
[facility] Gastroenterology Patient Name: Scott [name] Procedure Date: 05/29/2017 1:59 PM MRN: [id number] Account #: [id number] Date of Birth: 1941/01/03 Admit Type: Outpatient Age: 77 Room: [facility] ENDO ROOM 2 Gender: Male Note Status: Finalized Procedure:            Upper GI endoscopy Indications:          Epigastric abdominal pain, Dyspepsia, Heme positive                        stool Providers:            Lollie Sails, MD Referring MD:         Baxter Hire, MD (Referring MD) Medicines:            Monitored Anesthesia Care Complications:        No immediate complications. Procedure:            Pre-Anesthesia Assessment:                       - ASA Grade Assessment: III - A patient with severe                        systemic disease.                       After obtaining informed consent, the endoscope was                        passed under direct vision. Throughout the procedure,                        the patient's blood pressure, pulse, and oxygen                        saturations were monitored continuously. The Endoscope                        was introduced through the mouth, and advanced to the                        fourth part of duodenum. The upper GI endoscopy was                        accomplished without difficulty. The patient tolerated                        the procedure well. Findings:      The Z-line was variable. Biopsies were taken with a cold forceps for       histology.      The GE junction is somewhat patulous.      The exam of the esophagus was otherwise normal.      Diffuse minimal inflammation characterized by congestion (edema) and       erythema was found in the stomach. Biopsies were taken with a cold       forceps for histology. Biopsies were taken with a cold forceps for       Helicobacter pylori testing.      The cardia and gastric fundus were normal on retroflexion.      The examined duodenum was normal. Impression:            -  Z-line variable. Biopsied.                       - Gastritis. Biopsied.                       - Normal examined duodenum. Recommendation:       - Await pathology results.                       - Continue present medications.                       - For continued symptoms, consider gastric empyting                        study Procedure Code(s):    --- Professional ---                       [phone], Esophagogastroduodenoscopy, flexible, transoral;                        with biopsy, single or multiple Diagnosis Code(s):    --- Professional ---                       K22.8, Other specified diseases of esophagus                       K29.70, Gastritis, unspecified, without bleeding                       R10.13, Epigastric pain                       R19.5, Other fecal abnormalities CPT copyright 2017 [employer]. All rights reserved. The codes documented in this report are preliminary and upon coder review may  be revised to meet current compliance requirements. Lollie Sails, MD 05/29/2017 2:28:09 PM This report has been signed electronically. Number of Addenda: 0 Note Initiated On: 05/29/2017 1:59 PM      [facility]

## 2017-05-29 NOTE — Anesthesia Preprocedure Evaluation (Signed)
Anesthesia Evaluation  Patient identified by MRN, date of birth, ID band Patient awake    Reviewed: Allergy & Precautions, H&P , NPO status , Patient's Chart, lab work & pertinent test results, reviewed documented beta blocker date and time   History of Anesthesia Complications Negative for: history of anesthetic complications  Airway Mallampati: III  TM Distance: >3 FB Neck ROM: full    Dental no notable dental hx. (+) Teeth Intact   Pulmonary shortness of breath and with exertion, neg sleep apnea, neg COPD, neg recent URI, former smoker,           Cardiovascular Exercise Tolerance: Good hypertension, + angina with exertion + CAD, + Past MI, + Cardiac Stents (4 stents), + CABG, + Peripheral Vascular Disease and +CHF  (-) dysrhythmias (-) Valvular Problems/Murmurs     Neuro/Psych negative neurological ROS  negative psych ROS   GI/Hepatic negative GI ROS, Neg liver ROS,   Endo/Other  negative endocrine ROS  Renal/GU negative Renal ROS  negative genitourinary   Musculoskeletal   Abdominal   Peds  Hematology negative hematology ROS (+)   Anesthesia Other Findings Past Medical History:   Cardiomyopathy, ischemic                                       Comment:a. 04/2012 Echo: EF 40-45%;  b. 08/2013 Echo: EF               45-50%, mild glob HK, mod lat/post HK, diast               dysfxn, mildly dil LA, mild MR, nl RVSP; c.               01/2015 Echo: EF 40-45%, Gr 1 DD, mildly dil LA.   Hyperlipidemia                                               Carotid arterial disease ([place])                                 Comment:a. 05/2013 Carotid U/S; bilat 40-50% ICA               stenosis.   CAD (coronary artery disease)                                  Comment:a. 04/2012 Cath: 3VD->Med Rx;  b. 04/2013               Cath/PCI: RCA 70p, 52m, 95d. Mid & distal RCA               treated with 2 DES; c. 03/2014 PCI: LAD 80   (3.0x23 Xience Alpine DES), 30 ISR in RCA; d.               06/2014 CABG x 2 ([facility]) LIMA->LAD, VG->OM; e.               11/2014 Neg MV; f. 12/2014 Cath ([facility]): patent               grafts->Med Rx.   Chronic Chest Pain  Chronic systolic CHF (congestive heart failure*                Comment:a. 01/2015 Echo: EF 40-45%.   Reproductive/Obstetrics negative OB ROS                             Anesthesia Physical  Anesthesia Plan  ASA: III  Anesthesia Plan: General   Post-op Pain Management:    Induction: Intravenous  PONV Risk Score and Plan: 2 and Propofol infusion  Airway Management Planned: Nasal Cannula  Additional Equipment:   Intra-op Plan:   Post-operative Plan:   Informed Consent: I have reviewed the patients History and Physical, chart, labs and discussed the procedure including the risks, benefits and alternatives for the proposed anesthesia with the patient or authorized representative who has indicated his/her understanding and acceptance.   Dental Advisory Given  Plan Discussed with: Anesthesiologist, CRNA and Surgeon  Anesthesia Plan Comments:         Anesthesia Quick Evaluation

## 2017-05-29 NOTE — Transfer of Care (Signed)
Immediate Anesthesia Transfer of Care Note  Patient: Scott [name]  Procedure(s) Performed: ESOPHAGOGASTRODUODENOSCOPY (EGD) WITH PROPOFOL (N/A )  Patient Location: PACU and Endoscopy Unit  Anesthesia Type:General  Level of Consciousness: drowsy  Airway & Oxygen Therapy: Patient Spontanous Breathing and Patient connected to nasal cannula oxygen  Post-op Assessment: Report given to RN and Post -op Vital signs reviewed and stable  Post vital signs: Reviewed and stable  Last Vitals:  Vitals Value Taken Time  BP    Temp    Pulse 69 05/29/2017  2:26 PM  Resp 12 05/29/2017  2:26 PM  SpO2 97 % 05/29/2017  2:26 PM  Vitals shown include unvalidated device data.  Last Pain:  Vitals:   05/29/17 1331  TempSrc: Tympanic  PainSc: 0-No pain      Patients Stated Pain Goal: 0 (96/78/93 8101)  Complications: No apparent anesthesia complications

## 2017-05-30 ENCOUNTER — Encounter: Payer: Self-pay | Admitting: Gastroenterology

## 2017-06-01 DIAGNOSIS — R1013 Epigastric pain: Secondary | ICD-10-CM | POA: Diagnosis not present

## 2017-06-01 DIAGNOSIS — K2970 Gastritis, unspecified, without bleeding: Secondary | ICD-10-CM | POA: Diagnosis not present

## 2017-06-01 DIAGNOSIS — R195 Other fecal abnormalities: Secondary | ICD-10-CM | POA: Diagnosis not present

## 2017-06-01 LAB — SURGICAL PATHOLOGY

## 2017-06-08 DIAGNOSIS — R195 Other fecal abnormalities: Secondary | ICD-10-CM | POA: Diagnosis not present

## 2017-06-11 DIAGNOSIS — H353131 Nonexudative age-related macular degeneration, bilateral, early dry stage: Secondary | ICD-10-CM | POA: Diagnosis not present

## 2017-06-19 DIAGNOSIS — I10 Essential (primary) hypertension: Secondary | ICD-10-CM | POA: Diagnosis not present

## 2017-06-25 DIAGNOSIS — I480 Paroxysmal atrial fibrillation: Secondary | ICD-10-CM | POA: Diagnosis not present

## 2017-06-25 DIAGNOSIS — R079 Chest pain, unspecified: Secondary | ICD-10-CM | POA: Diagnosis not present

## 2017-06-25 DIAGNOSIS — I10 Essential (primary) hypertension: Secondary | ICD-10-CM | POA: Diagnosis not present

## 2017-06-25 DIAGNOSIS — E782 Mixed hyperlipidemia: Secondary | ICD-10-CM | POA: Diagnosis not present

## 2017-06-25 DIAGNOSIS — D6859 Other primary thrombophilia: Secondary | ICD-10-CM | POA: Diagnosis not present

## 2017-06-25 DIAGNOSIS — R69 Illness, unspecified: Secondary | ICD-10-CM | POA: Diagnosis not present

## 2017-07-11 ENCOUNTER — Telehealth: Payer: Self-pay | Admitting: Cardiovascular Disease

## 2017-07-11 NOTE — Telephone Encounter (Signed)
Pt calling stating he had complained of CP to his PCP Dr Wynetta Emery  He states it has been going on for about a couple weeks He was placed on Isosorbide on 06/25/17 He was advised by PCP if CP didn't go away to call us He is calling to get advise  He is not sure what else to do    Pt c/o of Chest Pain: STAT if CP now or developed within 24 hours  1. Are you having CP right now? No   2. Are you experiencing any other symptoms (ex. SOB, nausea, vomiting, sweating)? He states he does get SOB  3. How long have you been experiencing CP?  Couple weeks, usually depends on what he is doing   4. Is your CP continuous or coming and going?  Coming and going   5. Have you taken Nitroglycerin?  He's take his nitro, he's taken up to 4 a day  Not many days he states that he doesn't take his nitro When he takes the nitro the pain does seem to go away Most time it will give him a headache but he understands with taking that much nitro it comes along with it.   ?

## 2017-07-12 NOTE — Telephone Encounter (Signed)
lmov to schedule  °

## 2017-07-12 NOTE — Telephone Encounter (Signed)
Scheduled 08/14/17 at 8:20a with Dr. Rockey Situ

## 2017-07-12 NOTE — Telephone Encounter (Signed)
Spoke with patient and and he reports that he had some chest pain with weed eating and now is some better that he is on the riding lawnmower. His chest pain with associated shortness of breath comes on especially with exertional activities and sometimes at rest. Advised him to try taking his nitroglycerin and reviewed signs and symptoms which require immediate evaluation in the ED. He declined ED at this time stating that it is not that bad. Also recommended he try some ibuprofen to see if that will help. He verbalized understanding and just wanted to see if he could come in to review this with provider. Advised that I would have scheduling call him to set up appointment. He was very appreciative for the call with no further questions at this time.

## 2017-08-02 ENCOUNTER — Encounter: Payer: Self-pay | Admitting: Emergency Medicine

## 2017-08-02 ENCOUNTER — Emergency Department: Payer: Medicare HMO

## 2017-08-02 ENCOUNTER — Emergency Department
Admission: EM | Admit: 2017-08-02 | Discharge: 2017-08-02 | Disposition: A | Discharge status: Home / Self Care | Payer: Medicare HMO | Attending: Emergency Medicine | Admitting: Emergency Medicine

## 2017-08-02 DIAGNOSIS — R079 Chest pain, unspecified: Secondary | ICD-10-CM

## 2017-08-02 DIAGNOSIS — Z7901 Long term (current) use of anticoagulants: Secondary | ICD-10-CM | POA: Insufficient documentation

## 2017-08-02 DIAGNOSIS — I5032 Chronic diastolic (congestive) heart failure: Secondary | ICD-10-CM | POA: Diagnosis not present

## 2017-08-02 DIAGNOSIS — I5022 Chronic systolic (congestive) heart failure: Secondary | ICD-10-CM | POA: Insufficient documentation

## 2017-08-02 DIAGNOSIS — Z87891 Personal history of nicotine dependence: Secondary | ICD-10-CM | POA: Diagnosis not present

## 2017-08-02 DIAGNOSIS — I259 Chronic ischemic heart disease, unspecified: Secondary | ICD-10-CM | POA: Diagnosis not present

## 2017-08-02 DIAGNOSIS — Z79899 Other long term (current) drug therapy: Secondary | ICD-10-CM | POA: Insufficient documentation

## 2017-08-02 DIAGNOSIS — I110 Hypertensive heart disease with heart failure: Secondary | ICD-10-CM | POA: Insufficient documentation

## 2017-08-02 DIAGNOSIS — R0989 Other specified symptoms and signs involving the circulatory and respiratory systems: Secondary | ICD-10-CM | POA: Diagnosis not present

## 2017-08-02 DIAGNOSIS — R0789 Other chest pain: Secondary | ICD-10-CM | POA: Diagnosis not present

## 2017-08-02 LAB — CK: CK TOTAL: 116 U/L (ref 49–397)

## 2017-08-02 LAB — CBC
HCT: 47.5 % (ref 40.0–52.0)
Hemoglobin: 16.3 g/dL (ref 13.0–18.0)
MCH: 31.8 pg (ref 26.0–34.0)
MCHC: 34.3 g/dL (ref 32.0–36.0)
MCV: 92.8 fL (ref 80.0–100.0)
Platelets: 196 10*3/uL (ref 150–440)
RBC: 5.12 MIL/uL (ref 4.40–5.90)
RDW: 14.1 % (ref 11.5–14.5)
WBC: 6.2 10*3/uL (ref 3.8–10.6)

## 2017-08-02 LAB — TROPONIN I: Troponin I: <0.03 ng/mL (ref <0.03)

## 2017-08-02 LAB — BASIC METABOLIC PANEL
ANION GAP: 8 (ref 5–15)
BUN: 18 mg/dL (ref 8–23)
CALCIUM: 9.3 mg/dL (ref 8.9–10.3)
CO2: 25 mmol/L (ref 22–32)
Chloride: 108 mmol/L (ref 98–111)
Creatinine, Ser: 1.11 mg/dL (ref 0.61–1.24)
Glucose, Bld: 106 mg/dL — ABNORMAL HIGH (ref 70–99)
Potassium: 3.6 mmol/L (ref 3.5–5.1)
Sodium: 141 mmol/L (ref 135–145)

## 2017-08-02 LAB — MAGNESIUM: MAGNESIUM: 2 mg/dL (ref 1.7–2.4)

## 2017-08-02 MED ORDER — NITROGLYCERIN 2 % TD OINT
1.0000 [in_us] | TOPICAL_OINTMENT | Freq: Once | TRANSDERMAL | Status: AC
  Administered 2017-08-02: 1 [in_us] via TOPICAL
  Filled 2017-08-02: qty 1

## 2017-08-02 NOTE — ED Provider Notes (Signed)
[facility] Emergency Department Provider Note  ____________________________________________   None    (approximate)  I have reviewed the triage vital signs and the nursing notes.   HISTORY  Chief Complaint Chest Pain    HPI Scott [name] is a 77 y.o. male whose medical history includes but is not limited to cardiac bypass surgery about 3 years ago now on Xarelto and his cardiologist is Dr. Rockey Situ.  He presents by EMS today for evaluation of acute onset and severe generalized chest pain that is both sharp and aching and pressure-like.  It was accompanied with shortness of breath.  He states that he has been having episodic chest pain as many as several times a day for more than a month but it is getting more frequent and more severe.  Due to the episodic chest pain he scheduled an appointment with Dr. Rockey Situ, but the appointment is not for another 2 weeks and was scheduled almost a month ago.  The chest pain can occur at rest and also occurs more frequently when he is exerting himself such as walking up a hill or doing some weed eating in the yard.  The onset of pain today occurred while he was mowing the lawn.  The pain became severe and he took several nitroglycerin and it did not get better.  He is very concerned because the pain was considerably more severe and debilitating that has been recently.  After receiving a spray of nitroglycerin, sublingual nitroglycerin, 324 mg of aspirin, and 50 mg of fentanyl prior to arrival by EMS, he reports that the pain is completely resolved at this time.  He denies recent illnesses.  He only has shortness of breath associated with the chest pain.  He denies fever/chills, nausea, vomiting, abdominal pain.  Past Medical History:  Diagnosis Date  . Arthritis    right hip, since a fall  . CAD (coronary artery disease)    a. 04/2012 Cath: 3VD->Med Rx;  b. 04/2013 PCI RCA (2 DES); c. 03/2014 PCI: LAD 80 (3.0x23 Xience Alpine DES),  30 ISR in RCA; d. 06/2014 CABG x 2 ([facility]) LIMA->LAD, VG->OM; e. 12/2014 Cath ([facility]): patent grafts->Med Rx; f. 08/2015 Cath: patent grafts; g. 11/2016 Cath: LM min irregs, LAD 20p, 69m, LCX 100p/m, RCA 20ost, 10p/m/d, VG->OM1 nl, LIMA->dLAD nl.  . Cardiomyopathy, ischemic    a. 04/2012 Echo: EF 40-45%;  b. 08/2013 Echo: EF 45-50%, mild glob HK, mod lat/post HK, diast dysfxn, mildly dil LA, mild MR, nl RVSP; c. 01/2015 Echo: EF 40-45%, Gr 1 DD, mildly dil LA; 07/2016 Echo: EF 60-65%, no rwma, mild MR, mildly dil LA.  . Carotid arterial disease ([place])    a. 05/2013 Carotid U/S; bilat 40-50% ICA stenosis.  . Chronic Chest Pain   . Chronic systolic CHF (congestive heart failure) ([place])    a. 01/2015 Echo: EF 40-45%; b. 07/2016 Echo: EF 60-65%.  . Cough   . Diverticulosis   . Dyspnea    pt. unsure of exertion cause- that creates SOB at times   . Headache 1970's   migraine  . History of blood transfusion 2016   post op  . Hyperlipidemia   . Hypertension   . Iron deficiency anemia due to chronic blood loss 09/11/2016  . Myocardial infarction [facility])    unsure of when  . Pulmonary emboli ([place]) 07/2016   a. On Xarelto.  . Reflux esophagitis   . Sternal pain    a. 03/2016 s/p Sternal wire removal;  b. 05/2016 s/p redo median sternotomy for sternal debridement and sternal plating.  . Tubular adenoma of colon     Patient Active Problem List   Diagnosis Date Noted  . Syncope 11/19/2016  . 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 Iron deficiency anemia due to chronic blood loss 09/11/2016  . Pulmonary embolus ([place]) 08/31/2016  . Panic disorder   . 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 Panic attack 08/04/2016  . 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 Coronary artery disease involving native coronary artery of native heart with angina pectoris with documented spasm ([place])   . Hx of CABG   . Ischemic cardiomyopathy   . Chronic systolic CHF (congestive heart failure) ([place])   . Chronic Chest Pain   .  Coronary artery disease involving coronary bypass graft of native heart with angina pectoris with documented spasm ([place])   . Angina pectoris ([place])   . Anxiety   . Hyperlipemia   . Chronic diastolic heart failure ([place]) 12/21/2014  . Unstable angina ([place]) 12/08/2014  . PAF (paroxysmal atrial fibrillation) ([place]) 12/08/2014  . Chronic anticoagulation 12/08/2014  . Anemia 04/20/2014  . 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 Cardiomyopathy, ischemic   . HTN (hypertension)   . Carotid arterial disease ([place])   . S/P drug eluting coronary stent placement 05/30/2013  . Chest pain with low risk for cardiac etiology 05/05/2013  . SOB (shortness of breath) 05/05/2013  . Coronary artery disease involving coronary bypass graft of native heart with angina pectoris ([place]) 05/05/2013  . Hyperlipidemia 05/05/2013  . History of smoking 05/05/2013  . Cardiac angina ([place]) 05/05/2013    Past Surgical History:  Procedure Laterality Date  . CARDIAC CATHETERIZATION  05/01/2012  . CARDIAC CATHETERIZATION  04/2013   [facility];x3 stent  . CARDIAC CATHETERIZATION  01/11/15    [facility]  . CARDIAC CATHETERIZATION N/A 09/16/2015   Procedure: LEFT HEART CATH AND CORS/GRAFTS ANGIOGRAPHY;  Surgeon: Minna Merritts, MD;  Location: [place] [facility];  Service: Cardiovascular;  Laterality: N/A;  . CARPAL TUNNEL RELEASE     right hand  . CATARACT EXTRACTION     LEFT  . CATARACT EXTRACTION W/PHACO Left 09/16/2014   Procedure: CATARACT EXTRACTION PHACO AND INTRAOCULAR LENS PLACEMENT (IOC);  Surgeon: Leandrew Koyanagi, MD;  Location: [place];  Service: Ophthalmology;  Laterality: Left;  . COLONOSCOPY    . COLONOSCOPY WITH PROPOFOL N/A 01/28/2016   Procedure: COLONOSCOPY WITH PROPOFOL;  Surgeon: Lollie Sails, MD;  Location: [facility] ENDOSCOPY;  Service: Endoscopy;  Laterality: N/A;  . CORONARY ANGIOPLASTY WITH STENT PLACEMENT  04/13/2014  . CORONARY ARTERY BYPASS GRAFT  06-26-14   x3 bypasses  . DE QUERVAIN'S RELEASE  Left 08/22/2012  . ESOPHAGOGASTRODUODENOSCOPY (EGD) WITH PROPOFOL N/A 04/26/2015   Procedure: ESOPHAGOGASTRODUODENOSCOPY (EGD) WITH PROPOFOL;  Surgeon: Hulen Luster, MD;  Location[facility]COPY;  Service: Gastroenterology;  Laterality: N/A;  . ESOPHAGOGASTRODUODENOSCOPY (EGD) WITH PROPOFOL N/A 05/29/2017   Procedure: ESOPHAGOGASTRODUODENOSCOPY (EGD) WITH PROPOFOL;  Surgeon: Lollie Sails, MD;  Location: [facility] ENDOSCOPY;  Service: Endoscopy;  Laterality: N/A;  . LEFT HEART CATH AND CORONARY ANGIOGRAPHY N/A 12/04/2016   Procedure: LEFT HEART CATH AND CORS/GRAFTS  ANGIOGRAPHY;  Surgeon: Wellington Hampshire, MD;  Location: [place] [facility];  Service: Cardiovascular;  Laterality: N/A;  . RIB PLATING N/A 05/25/2016   Procedure: STERNAL PLATING;  Surgeon: Melrose Nakayama, MD;  Location: [place];  Service: Thoracic;  Laterality: N/A;  . right shoulder    . STERNAL WIRES  REMOVAL N/A 03/30/2016   Procedure: STERNAL WIRES REMOVAL;  Surgeon: Melrose Nakayama, MD;  Location: [place];  Service: Thoracic;  Laterality: N/A;  . TONSILLECTOMY      Prior to Admission medications   Medication Sig Start Date End Date Taking? Authorizing Provider  albuterol (PROVENTIL HFA;VENTOLIN HFA) 108 (90 Base) MCG/ACT inhaler Inhale 2 puffs into the lungs every 6 (six) hours as needed for wheezing or shortness of breath.   Yes [provider]  atorvastatin (LIPITOR) 80 MG tablet TAKE ONE TABLET AT BED- TIME. 03/01/17  Yes Gollan, [name], MD  dicyclomine (BENTYL) 10 MG capsule Take 10 mg by mouth 4 (four) times daily -  before meals and at bedtime.   Yes [provider]  isosorbide mononitrate (IMDUR) 30 MG 24 hr tablet Take 30 mg by mouth daily.    Yes [provider]  pantoprazole (PROTONIX) 40 MG tablet Take 40 mg by mouth daily.   Yes [provider]  rivaroxaban (XARELTO) 20 MG TABS tablet Take 1 tablet (20 mg total) by mouth daily. 04/26/17  Yes Minna Merritts, MD  tiotropium  (SPIRIVA) 18 MCG inhalation capsule Place 18 mcg into inhaler and inhale daily.   Yes [provider]  ferrous sulfate 325 (65 FE) MG EC tablet Take 1 tablet (325 mg total) by mouth 2 (two) times daily with a meal. Patient not taking: Reported on 05/29/2017 05/05/17   Earlie Server, MD  nitroGLYCERIN (NITROSTAT) 0.4 MG SL tablet Place 1 tablet (0.4 mg total) under the tongue every 5 (five) minutes as needed for chest pain. 04/26/17   Minna Merritts, MD  traMADol (ULTRAM) 50 MG tablet Take 1-2 tablets (50-100 mg total) by mouth every 6 (six) hours as needed for moderate pain. Patient not taking: Reported on 08/02/2017 06/30/16   Melrose Nakayama, MD  zolpidem (AMBIEN) 5 MG tablet Take 1 tablet (5 mg total) by mouth at bedtime as needed for sleep. Patient not taking: Reported on 08/02/2017 03/16/17   Wilhelmina Mcardle, MD    Allergies Contrast media [iodinated diagnostic agents]; Metrizamide; Oxycodone hcl; and Vicodin [hydrocodone-acetaminophen]  Family History  Problem Relation Age of Onset  . Heart attack Father 59       MI  . Cancer Maternal Uncle     Social History Social History   Tobacco Use  . Smoking status: Former Smoker    Packs/day: 1.00    Years: 35.00    Pack years: 35.00    Types: Cigarettes    Start date: 05/02/2009  . Smokeless tobacco: Former Systems developer  . Tobacco comment: quit in 2006  Substance Use Topics  . Alcohol use: No  . Drug use: No    Review of Systems Constitutional: No fever/chills Eyes: No visual changes. ENT: No sore throat. Cardiovascular: Chest pain as described above Respiratory: Shortness of breath associated with chest pain Gastrointestinal: No abdominal pain.  No nausea, no vomiting.  No diarrhea.  No constipation. Genitourinary: Negative for dysuria. Musculoskeletal: Negative for neck pain.  Negative for back pain. Integumentary: Negative for rash. Neurological: Negative for headaches, focal weakness or  numbness.   ____________________________________________   PHYSICAL EXAM:  ED Triage Vitals  Enc Vitals Group     BP 08/02/17 1100 (!) 163/92     Pulse Rate 08/02/17 1100 (!) 58     Resp 08/02/17 1100 18     Temp 08/02/17 1104 98.7 F (37.1 C)     Temp Source 08/02/17 1104 Oral  SpO2 08/02/17 1100 99 %     Weight 08/02/17 1104 79.4 kg (175 lb)     Height 08/02/17 1104 1.778 m (5\' 10" )     Head Circumference --      Peak Flow --      Pain Score 08/02/17 1104 4     Pain Loc --      Pain Edu? --      Excl. in [place]? --     Constitutional: Alert and oriented. Well appearing and in no acute distress. Eyes: Conjunctivae are normal.  Head: Atraumatic. Nose: No congestion/rhinnorhea. Mouth/Throat: Mucous membranes are moist. Neck: No stridor.  No meningeal signs.   Cardiovascular: Normal rate, regular rhythm. Good peripheral circulation. Grossly normal heart sounds. Mild tenderness to palpation of anterior chest wall with well-healed sternotomy scars. Respiratory: Normal respiratory effort.  No retractions. Lungs CTAB. Gastrointestinal: Soft and nontender. No distention.  Musculoskeletal: No lower extremity tenderness nor edema. No gross deformities of extremities. Neurologic:  Normal speech and language. No gross focal neurologic deficits are appreciated.  Skin:  Skin is warm, dry and intact. No rash noted. Psychiatric: Mood and affect are normal. Speech and behavior are normal.  ____________________________________________   LABS (all labs ordered are listed, but only abnormal results are displayed)  Labs Reviewed  BASIC METABOLIC PANEL - Abnormal; Notable for the following components:      Result Value   Glucose, Bld 106 (*)    All other components within normal limits  CBC  TROPONIN I  CK  MAGNESIUM  TROPONIN I   ____________________________________________   EKG  ED ECG REPORT #1 I, Hinda Kehr, the attending physician, personally viewed and interpreted  this ECG.  Date: 08/02/2017 EKG Time: 10:54 AM Rate: 74 Rhythm: normal sinus rhythm QRS Axis: LAD Intervals: Nonspecific intraventricular conduction delay, less consistent with left bundle branch block ST/T Wave abnormalities: Non-specific ST segment / T-wave changes, but no evidence of acute ischemia. Narrative Interpretation: no evidence of acute ischemia.  Reviewed multiple prior EKGs, and there is no significant change.   ED ECG REPORT #2 I, Hinda Kehr, the attending physician, personally viewed and interpreted this ECG.  Date: 08/02/2017 EKG Time: 11:54 Rate: 63 Rhythm: sinus rhythm with PVCs QRS Axis: LAD Intervals: normal ST/T Wave abnormalities: Non-specific ST segment / T-wave changes, but no evidence of acute ischemia. Narrative Interpretation: no evidence of acute ischemia, but increased occurrence of PVCs and at least 1 pause between beats without a concurrent PVC.   ____________________________________________  RADIOLOGY I, Hinda Kehr, personally viewed and evaluated these images (plain radiographs) as part of my medical decision making, as well as reviewing the written report by the radiologist.  ED MD interpretation: No evidence of acute abnormality on chest x-ray  Official radiology report(s): Dg Chest Portable 1 View  Result Date: 08/02/2017 CLINICAL DATA:  77 year old male with history of intermittent chest pain for the past several weeks, with some relief from nitroglycerin. EXAM: PORTABLE CHEST 1 VIEW COMPARISON:  Chest x-ray 03/20/2017. FINDINGS: Lung volumes are normal. No consolidative airspace disease. No pleural effusions. No pneumothorax. No pulmonary nodule or mass noted. Pulmonary vasculature and the cardiomediastinal silhouette are within normal limits. Status post median sternotomy for CABG. Orthopedic fixation hardware in the manubrium and sternum. IMPRESSION: No radiographic evidence of acute cardiopulmonary disease. Electronically Signed   By:  Vinnie Langton M.D.   On: 08/02/2017 11:21    ____________________________________________   PROCEDURES  Critical Care performed: No   Procedure(s) performed:  Procedures   ____________________________________________   INITIAL IMPRESSION / ASSESSMENT AND PLAN / ED COURSE  As part of my medical decision making, I reviewed the following data within the [place] notes reviewed and incorporated, Labs reviewed , EKG interpreted , Old EKG reviewed, Old chart reviewed, Radiograph reviewed , Discussed with patient's cardiologist (Dr. Rockey Situ),  and Notes from prior ED visits    Differential diagnosis includes, but is not limited to, ACS, aortic dissection, pulmonary embolism, cardiac tamponade, pneumothorax, pneumonia, pericarditis, myocarditis, GI-related causes including esophagitis/gastritis, and musculoskeletal chest wall pain.    However, given the patient's risk factors for ACS and the fact that he has been having gradually worsening episodic chest pain both at rest and associated with exertion over the last 4 to 6 weeks which is now severe enough to cause him to come to the emergency department, I am concerned that his symptoms represent unstable angina.  He is already received a full dose of aspirin and he is currently chest pain-free.  No significant changes on EKG compared to prior.  I will await the results of his labs but I do anticipate speaking with Dr. Rockey Situ and initiating treatment for unstable angina and less a very close or short follow-up can be established with Dr. Rockey Situ.  Patient and his wife understand and agree with the plan.  Clinical Course as of Aug 03 1546  Thu Aug 02, 2017  1154 Recurrence of severe central chest pain and pressure with mild shortness of breath.  Administering morphine 4 mg IV and I will place nitroglycerin paste on his chest 1 inch.  Repeating EKG.   [CF]  1156 Chest pain solved after about 30 seconds, will not give  morphine but will put on the nitroglycerin paste.  I am also adding on a magnesium level and him calling Dr. Rockey Situ.  Anticipate admission for high risk chest pain /unstable angina.   [CF]  1202 Troponin is within normal limits which is reassuring.  The rest of his lab results are likewise reassuring and within normal limits in terms of metabolic panel and CBC.  Magnesium to be added on.  Awaiting callback from Dr. Rockey Situ.  Troponin I: <0.03 [CF]  1224 Magnesium: 2.0 [CF]  1229 I had an extensive conversation with Dr. Rockey Situ by phone about the patient, whom he knows well and is treated extensively over the years.  He pointed out that the patient has chronic chest pain and has had numerous cardiac catheterizations which are generally reassuring as well as normal stress tests over the years.  The patient is even been sent to a psychiatrist for the anxiety and PTSD component associated with his prior cardiac issues.  Dr. Rockey Situ of course stated that he would defer to my judgment since I am seeing the patient in person, but he said he would be willing to schedule an outpatient stress test and close follow-up in less than a week in clinic if the patient is comfortable with the plan to go home.  I will broach this issue with the patient because there does not seem to be anything new or different today and he has had to generally reassuring EKGs with no evidence of ischemia and normal lab work and normal vital signs.   [CF]  1307 Discussed the plan with the patient and his wife and they are both comfortable with discharge and outpatient follow-up.  In fact I had to convince him to stay for second troponin.  He is currently  pain-free.  Assuming the second troponin is negative he will be appropriate to follow-up with Dr. Rockey Situ, and  I will send Dr. Rockey Situ a message through [facility] to confirm the plan.   [CF]  1512 Second troponin is negative.  Patient is pain-free.  Will discharge as per plan described above   [CF]     Clinical Course User Index [CF] Hinda Kehr, MD    ____________________________________________  FINAL CLINICAL IMPRESSION(S) / ED DIAGNOSES  Final diagnoses:  Chest pain, unspecified type     MEDICATIONS GIVEN DURING THIS VISIT:  Medications  nitroGLYCERIN (NITROGLYN) 2 % ointment 1 inch (1 inch Topical Given 08/02/17 1203)     ED Discharge Orders    None       Note:  This document was prepared using Dragon voice recognition software and may include unintentional dictation errors.    Hinda Kehr, MD 08/02/17 [phone]

## 2017-08-02 NOTE — Discharge Instructions (Signed)

## 2017-08-02 NOTE — ED Notes (Signed)
ED Provider at bedside. 

## 2017-08-02 NOTE — ED Notes (Signed)
Pt called out, states pain to center chest, holding chest, labored breathing, tachypnea.  MD notified.  VORB for repeat EKG and nitro paste.

## 2017-08-02 NOTE — ED Triage Notes (Signed)
Pt arrived via ems from home with complaints of chest pain. Pt states he has had intermittent chest pain for weeks. Pt states he has been taking "2 or 3 nitro then it eases off." Pt states today the intensity was "worse." Pt states he was mowing and knew something was not right. Pt was given 324 of asa, 1 spray of nitroglycerin, 1 sublingual nitroglycerin, & 50 mcg of fentanyl prior to arrival.

## 2017-08-03 ENCOUNTER — Telehealth: Payer: Self-pay | Admitting: Cardiovascular Disease

## 2017-08-03 DIAGNOSIS — I25709 Atherosclerosis of coronary artery bypass graft(s), unspecified, with unspecified angina pectoris: Secondary | ICD-10-CM

## 2017-08-03 NOTE — Telephone Encounter (Signed)
Spoke with patients wife per release form and reviewed stress test was recommended due to ED visit yesterday. She confirmed this information and was ready to schedule at this time. Scheduled him to go on Tuesday 08/07/17 with arrival time of 07:45AM at the [facility] Entrance. Reviewed all other instructions in detail and she verbalized understanding with no further questions at this time.

## 2017-08-03 NOTE — Telephone Encounter (Signed)
Scott [name] wife stopped by and states Scott [name] was in ED yesterday, and said they told him Dr. Donivan Scull office would call to schedule a stress test for the 1st of next week. Scott [name] is scheduled for 7/23 to see Dr. Rockey Situ . Please call and advise of stress test

## 2017-08-07 ENCOUNTER — Encounter
Admission: RE | Admit: 2017-08-07 | Discharge: 2017-08-07 | Disposition: A | Discharge status: Home / Self Care | Payer: Medicare HMO | Source: Ambulatory Visit | Attending: Cardiovascular Disease | Admitting: Cardiovascular Disease

## 2017-08-07 DIAGNOSIS — I25709 Atherosclerosis of coronary artery bypass graft(s), unspecified, with unspecified angina pectoris: Secondary | ICD-10-CM | POA: Diagnosis not present

## 2017-08-07 LAB — NM MYOCAR MULTI W/SPECT W/WALL MOTION / EF
CHL CUP RESTING HR STRESS: 57 {beats}/min
CSEPHR: 53 %
LV dias vol: 118 mL (ref 62–150)
LV sys vol: 58 mL
Peak HR: 77 {beats}/min
TID: 1.03

## 2017-08-07 MED ORDER — NITROGLYCERIN 0.4 MG SL SUBL
0.4000 mg | SUBLINGUAL_TABLET | SUBLINGUAL | Status: DC | PRN
  Administered 2017-08-07 (×2): 0.4 mg via SUBLINGUAL
  Filled 2017-08-07: qty 25

## 2017-08-07 MED ORDER — TECHNETIUM TC 99M TETROFOSMIN IV KIT
13.8000 | PACK | Freq: Once | INTRAVENOUS | Status: AC | PRN
  Administered 2017-08-07: 13.8 via INTRAVENOUS

## 2017-08-07 MED ORDER — REGADENOSON 0.4 MG/5ML IV SOLN
0.4000 mg | Freq: Once | INTRAVENOUS | Status: AC
  Administered 2017-08-07: 0.4 mg via INTRAVENOUS

## 2017-08-07 MED ORDER — TECHNETIUM TC 99M TETROFOSMIN IV KIT
32.7100 | PACK | Freq: Once | INTRAVENOUS | Status: AC | PRN
  Administered 2017-08-07: 32.71 via INTRAVENOUS

## 2017-08-12 NOTE — Progress Notes (Signed)
Cardiology Office Note  Date:  08/14/2017   ID:  JERMELL HOLEMAN, DOB February 19, 1940, MRN [id number]  PCP:  Baxter Hire, MD   Chief Complaint  Patient presents with  . Other    Follow up from Stress test. Patient c/o Chest pain and SOB. Meds reviewed verbally with patient.     HPI:  Mr. Fugitt is a 77 year old gentleman with history of  Chronic chest pain syndrome/anxiety/panic attacks CAD, stenting CABG 2 with LIMA to the LAD 06/2014, vein graft to the OM at [facility],  chronic chest pain not relieved with bypass surgery,  Redo median sternotomy for sternal plating  on 05/25/2016 EF 40 to 45% 01/2015 Smoker, age 81 to 53 quit 2005 Pulmonary embolism July 2018, workup through hematology Postoperative atrial fibrillation at [facility] June 2016 following CABG who presents for follow-up of his anxiety and chronic stable angina pectoralis  Recently seen in the emergency room 08/02/2017 for chest pain Troponin negative, EKG unchanged Discharged home with outpatient stress test Hospital records reviewed with the patient in detail  Had stress test in follow-up showing no ischemia Fixed defect basal mid inferior and inferolateral wall consistent with previous scar Ejection fraction 51%  Chest pain typically relieved with nitroglycerin Continues to have periodic chest pain as he has for many years Periodic shortness of breath Having some episodes of coughing often at nighttime sometimes just sitting there in the day  On last office visit was orthostatic, does not drink much fluids Denies orthostasis symptoms on today's visit  Active at baseline Rebuilding a barn, Tore old barn down No significant pain with doing all these activities Nervous about going on a cruise in one month to [place] Often able to walk long distance without any chest pain Sometimes has some chest tightness walking up a hill. Does not take his inhalers  EKG personally reviewed by myself on todays visit Shows normal  sinus rhythm rate 67 bpm old inferior MI, consider old anterior MI  Notes reviewed for history as below ER visits Chest pain shortness of breath 03/20/17 Dizziness 01/26/2017 Chest pain 01/22/2017 Mental status change/anxiety  11/19/2016 Chest pain  11/07/2016 Chest pain  09/11/2016 SOB  08/04/2016 Chest pain 02/12/2016  Cath  12/04/2016 Cath 09/16/2015 Cath 01/11/2015  bypass surgery June 2016 at [facility]  Cath 05/29/2014, 50% LAD lesion, distal RCA disease Cath 04/13/2014, DES to the mid LAD Cath 05/16/2013,  stent to the RCA  Stress test July 2019 no ischemia Stress test 11/08/2016 Stress test 06/15/2015 Stress test 04/21/2015 Stress test 04/08/2014 Stress test 07/29/2012 Stress test 04/22/2012  March 2019 Numerous imaging studies of his abdomen, ultrasound, MRCP, All appeared essentially benign  hospitalization November 07, 2016 for chest pain Stress test showed no ischemia  Despite this he had cardiac catheterization 12/04/2016 for chest pain Medical management recommended  hospital November 19, 2016  Had involuntary leg movements, Described as myoclonus and mental status changes  CT scan of the head, was normal MRI performed, seen by neurology Showed sinusitis on MR  Felt to have possible panic attacks EEG unremarkable Zoloft was increased He was told not to drive by neurology for 1 month given acute onset of anxiety attack  Referred from pain clinic in [place] to CT surg for consult Bone scan:diffuse increased uptake in the manubrium and upper sternum sternal wire removal on 03/30/2016. Redo median sternotomy for sternal plating by Dr. Roxan Hockey on 05/25/2016. lung densely adherent to the underside of the sternum and there was a small laceration  of the left upper lobe  history of CAD dating back to April 2014 at which time he was found to have severe distal RCA disease with left to right collaterals, a subtotally occluded left circumflex, and moderate, diffuse LAD  disease. He was initially medically managed.   In April 2015, he underwent repeat catheterization secondary to ongoing chest pain, showing stable moderate to severe disease. He then underwent successful RCA stenting.   In March 2016, due to recurrent chest pain, he underwent successful stenting of the LAD.    he had recurrent chest pain again in June 2016 and was seen at [facility].  He underwent CABG 2 with a LIMA to the LAD and a vein graft to the obtuse marginal.   following CABG, he continued to have persistent/chronic low-level left-sided and midsternal chest pain.  He had repeat hospitalizations  with stress testing in November at [facility], which was negative,  catheterization at [facility] in December 2016, which apparently showed patent grafts.  Lab work in the hospital  showing total cholesterol 142, LDL 77, HDL 47  Echocardiogram 03/02/2014 showing normal LV function, essentially normal study  Echocardiogram 04/22/2012 showing ejection fraction 40%, no mention of wall motion abnormality, mild MR, TR  PMH:   has a past medical history of Arthritis, CAD (coronary artery disease), Cardiomyopathy, ischemic, Carotid arterial disease ([place]), Chronic Chest Pain, Chronic systolic CHF (congestive heart failure) ([place]), Cough, Diverticulosis, Dyspnea, Headache (1970's), History of blood transfusion (2016), Hyperlipidemia, Hypertension, Iron deficiency anemia due to chronic blood loss (09/11/2016), Myocardial infarction [facility]), Pulmonary emboli ([place]) (07/2016), Reflux esophagitis, Sternal pain, and Tubular adenoma of colon.  PSH:    Past Surgical History:  Procedure Laterality Date  . CARDIAC CATHETERIZATION  05/01/2012  . CARDIAC CATHETERIZATION  04/2013   [facility];x3 stent  . CARDIAC CATHETERIZATION  01/11/15    [facility]  . CARDIAC CATHETERIZATION N/A 09/16/2015   Procedure: LEFT HEART CATH AND CORS/GRAFTS ANGIOGRAPHY;  Surgeon: Minna Merritts, MD;  Location: [place] CV LAB;  Service: Cardiovascular;   Laterality: N/A;  . CARPAL TUNNEL RELEASE     right hand  . CATARACT EXTRACTION     LEFT  . CATARACT EXTRACTION W/PHACO Left 09/16/2014   Procedure: CATARACT EXTRACTION PHACO AND INTRAOCULAR LENS PLACEMENT (IOC);  Surgeon: Leandrew Koyanagi, MD;  Location: [facility];  Service: Ophthalmology;  Laterality: Left;  . COLONOSCOPY    . COLONOSCOPY WITH PROPOFOL N/A 01/28/2016   Procedure: COLONOSCOPY WITH PROPOFOL;  Surgeon: Lollie Sails, MD;  Location: [facility] ENDOSCOPY;  Service: Endoscopy;  Laterality: N/A;  . CORONARY ANGIOPLASTY WITH STENT PLACEMENT  04/13/2014  . CORONARY ARTERY BYPASS GRAFT  06-26-14   x3 bypasses  . DE QUERVAIN'S RELEASE Left 08/22/2012  . ESOPHAGOGASTRODUODENOSCOPY (EGD) WITH PROPOFOL N/A 04/26/2015   Procedure: ESOPHAGOGASTRODUODENOSCOPY (EGD) WITH PROPOFOL;  Surgeon: Hulen Luster, MD;  Location: [facility] ENDOSCOPY;  Service: Gastroenterology;  Laterality: N/A;  . ESOPHAGOGASTRODUODENOSCOPY (EGD) WITH PROPOFOL N/A 05/29/2017   Procedure: ESOPHAGOGASTRODUODENOSCOPY (EGD) WITH PROPOFOL;  Surgeon: Lollie Sails, MD;  Location: [facility] ENDOSCOPY;  Service: Endoscopy;  Laterality: N/A;  . LEFT HEART CATH AND CORONARY ANGIOGRAPHY N/A 12/04/2016   Procedure: LEFT HEART CATH AND CORS/GRAFTS  ANGIOGRAPHY;  Surgeon: Wellington Hampshire, MD;  Location: [place];  Service: Cardiovascular;  Laterality: N/A;  . RIB PLATING N/A 05/25/2016   Procedure: STERNAL PLATING;  Surgeon: Melrose Nakayama, MD;  Location: [facility];  Service: Thoracic;  Laterality: N/A;  . right shoulder    .  STERNAL WIRES REMOVAL N/A 03/30/2016   Procedure: STERNAL WIRES REMOVAL;  Surgeon: Melrose Nakayama, MD;  Location: [facility] OR;  Service: Thoracic;  Laterality: N/A;  . TONSILLECTOMY      Current Outpatient Medications  Medication Sig Dispense Refill  . atorvastatin (LIPITOR) 80 MG tablet TAKE ONE TABLET AT BED- TIME. 90 tablet 3  . dicyclomine (BENTYL) 10 MG capsule Take 10 mg by mouth 4 (four)  times daily -  before meals and at bedtime.    . isosorbide mononitrate (IMDUR) 60 MG 24 hr tablet Take 60 mg by mouth daily.     . nitroGLYCERIN (NITROSTAT) 0.4 MG SL tablet Place 1 tablet (0.4 mg total) under the tongue every 5 (five) minutes as needed for chest pain. 25 tablet 6  . pantoprazole (PROTONIX) 40 MG tablet Take 40 mg by mouth daily.    . rivaroxaban (XARELTO) 20 MG TABS tablet Take 1 tablet (20 mg total) by mouth daily. 90 tablet 3   No current facility-administered medications for this visit.      Allergies:   Contrast media [iodinated diagnostic agents]; Metrizamide; Oxycodone hcl; and Vicodin [hydrocodone-acetaminophen]   Social History:  The patient  reports that he has quit smoking. His smoking use included cigarettes. He started smoking about 8 years ago. He has a 35.00 pack-year smoking history. He has quit using smokeless tobacco. He reports that he does not drink alcohol or use drugs.   Family History:   family history includes Cancer in his maternal uncle; Heart attack (age of onset: 36) in his father.    Review of Systems: Review of Systems  Respiratory: Positive for shortness of breath.   Cardiovascular: Positive for chest pain.  Gastrointestinal: Negative.   Musculoskeletal: Negative.   Neurological: Negative.   Psychiatric/Behavioral: Negative.   All other systems reviewed and are negative.    PHYSICAL EXAM: VS:  BP 116/62 (BP Location: Left Arm, Patient Position: Sitting, Cuff Size: Normal)   Pulse (!) 59   Ht 5\' 10"  (1.778 m)   Wt 175 lb (79.4 kg)   BMI 25.11 kg/m  , BMI Body mass index is 25.11 kg/m.  no significant change in exam Constitutional:  oriented to person, place, and time. No distress.  HENT:  Head: Normocephalic and atraumatic.  Eyes:  no discharge. No scleral icterus.  Neck: Normal range of motion. Neck supple. No JVD present.  Cardiovascular: Normal rate, regular rhythm, normal heart sounds and intact distal pulses. Exam reveals  no gallop and no friction rub. No edema No murmur heard. Pulmonary/Chest: Effort normal and breath sounds normal. No stridor. No respiratory distress.  no wheezes.  no rales.  no tenderness.  Abdominal: Soft.  no distension.  no tenderness.  Musculoskeletal: Normal range of motion.  no  tenderness or deformity.  Neurological:  normal muscle tone. Coordination normal. No atrophy Skin: Skin is warm and dry. No rash noted. not diaphoretic.  Psychiatric:  normal mood and affect. behavior is normal. Thought content normal.   Recent Labs: 11/19/2016: TSH 3.714 11/20/2016: ALT 17 08/02/2017: BUN 18; Creatinine, Ser 1.11; Hemoglobin 16.3; Magnesium 2.0; Platelets 196; Potassium 3.6; Sodium 141    Lipid Panel Lab Results  Component Value Date   CHOL 103 11/07/2016   HDL 41 11/07/2016   LDLCALC 56 11/07/2016   TRIG 31 11/07/2016      Wt Readings from Last 3 Encounters:  08/14/17 175 lb (79.4 kg)  08/02/17 175 lb (79.4 kg)  05/29/17 173 lb (78.5 kg)  ASSESSMENT AND PLAN:  Chest pain, unspecified chest pain type -  Long history of chest pain Frequent cardiac catheterizations, stress testing Recent stress test with no ischemia Medical management recommended for recurrent pain symptoms Very few Options for him given low blood pressure He can try isosorbide 30 twice a day rather than 60 in the evening Suggested he could retry the ranexa Otherwise very active at baseline on his property Recommended he continue nitroglycerin for suspected small vessel disease or chest wall pain Anxiety/panic likely playing a role in his presentations No indication for cardiac catheterization given normal stress test several weeks ago, chronic history of pain  Coronary artery disease involving native coronary artery of native heart  Medical management as detailed above Long history of repeated stress test and catheterizations for recurrent symptoms  Previously seen by psychiatry and neurology for  anxiety No further testing at this time Recommended for further episodes he could call our office have troponin to the hospital lobby and  EKG through our officeto avoid the emergency room  Hyperlipidemia Cholesterol is at goal on the current lipid regimen. No changes to the medications were made.  Stable  Dizziness Dizziness is a chronic issue dating back several years Was not discussed today, did not seem to be an issue Recommended he drink more fluids Previously orthostatic  Shortness of breath 45 years of smoking, likely with COPD Reports walking without severe symptoms Previously seen by pulmonary Recommended he try albuterol inhaler for chest tightness  Pulmonary embolism on Xarelto, recent CT scan with no PE/has resolved  Postop atrial fibrillation Noted back in June 2016 No documented atrial fibrillation since that time For now will continue on Xarelto 20 daily  encounter time more than 45 minutes  Greater than 50% was spent in counseling and coordination of care with the patient  Disposition:   F/U  6 month   Orders Placed This Encounter  Procedures  . EKG 12-Lead     Signed, Esmond Plants, M.D., Ph.D. 08/14/2017  [place], [place]

## 2017-08-14 ENCOUNTER — Encounter: Payer: Self-pay | Admitting: Cardiovascular Disease

## 2017-08-14 ENCOUNTER — Ambulatory Visit: Payer: Medicare HMO | Admitting: Cardiovascular Disease

## 2017-08-14 ENCOUNTER — Telehealth: Payer: Self-pay

## 2017-08-14 VITALS — BP 116/62 | HR 59 | Ht 70.0 in | Wt 175.0 lb

## 2017-08-14 DIAGNOSIS — I255 Ischemic cardiomyopathy: Secondary | ICD-10-CM | POA: Diagnosis not present

## 2017-08-14 DIAGNOSIS — I25709 Atherosclerosis of coronary artery bypass graft(s), unspecified, with unspecified angina pectoris: Secondary | ICD-10-CM

## 2017-08-14 DIAGNOSIS — I10 Essential (primary) hypertension: Secondary | ICD-10-CM

## 2017-08-14 DIAGNOSIS — E782 Mixed hyperlipidemia: Secondary | ICD-10-CM | POA: Diagnosis not present

## 2017-08-14 DIAGNOSIS — R0602 Shortness of breath: Secondary | ICD-10-CM | POA: Diagnosis not present

## 2017-08-14 DIAGNOSIS — I209 Angina pectoris, unspecified: Secondary | ICD-10-CM

## 2017-08-14 DIAGNOSIS — I480 Paroxysmal atrial fibrillation: Secondary | ICD-10-CM

## 2017-08-14 DIAGNOSIS — I779 Disorder of arteries and arterioles, unspecified: Secondary | ICD-10-CM | POA: Diagnosis not present

## 2017-08-14 DIAGNOSIS — I739 Peripheral vascular disease, unspecified: Secondary | ICD-10-CM

## 2017-08-14 MED ORDER — RANOLAZINE ER 1000 MG PO TB12: 1000.0000 mg | ORAL_TABLET | Freq: Two times a day (BID) | ORAL | 6 refills | Status: DC

## 2017-08-14 MED ORDER — RIVAROXABAN 20 MG PO TABS: 20.0000 mg | ORAL_TABLET | Freq: Every day | ORAL | 3 refills | Status: DC

## 2017-08-14 NOTE — Telephone Encounter (Signed)
Spoke with pt. I let the pt know that we spoke with the Ranexa rep, and they no longer offer samples since the drug is transitioning to generic. Adv the pt that Dr.Gollan did send in the prescription to his pharmacy, he should go ahead and pick it up and start as instructed by Dr.Gollan. Pt should call the office if any issues with cost or if he feels he isn't tolerating the medication. Pt agreeable with plan and verbalized understanding.

## 2017-08-14 NOTE — Patient Instructions (Addendum)
Medication Instructions:   Consider trying ranexa 500 mg twice a day for one week Then up to 1000 mg twice a day  Try imdur/isosorbide in the AM Or 1/2 pill twice a day   Labwork:  No new labs needed  Testing/Procedures:  No further testing at this time   Follow-Up: It was a pleasure seeing you in the office today. Please call us if you have new issues that need to be addressed before your next appt.  [phone]  Your physician wants you to follow-up in: 6 months  If you need a refill on your cardiac medications before your next appointment, please call your pharmacy.  For educational health videos Log in to : [email or web address] Or : [email or web address], password : [email or web address]

## 2017-09-20 IMAGING — DX DG CHEST 2V
2 series · 2 of 2 positions shown · non-contrast
Comparison: PA and lateral chest x-ray March 28, 2016

CLINICAL DATA: Chest pain and shortness of breath since sternal
while removal in March 2016. History of coronary artery disease and
CABG.

EXAM:
CHEST  2 VIEW

[dg chest 2 view (1 of 2)]
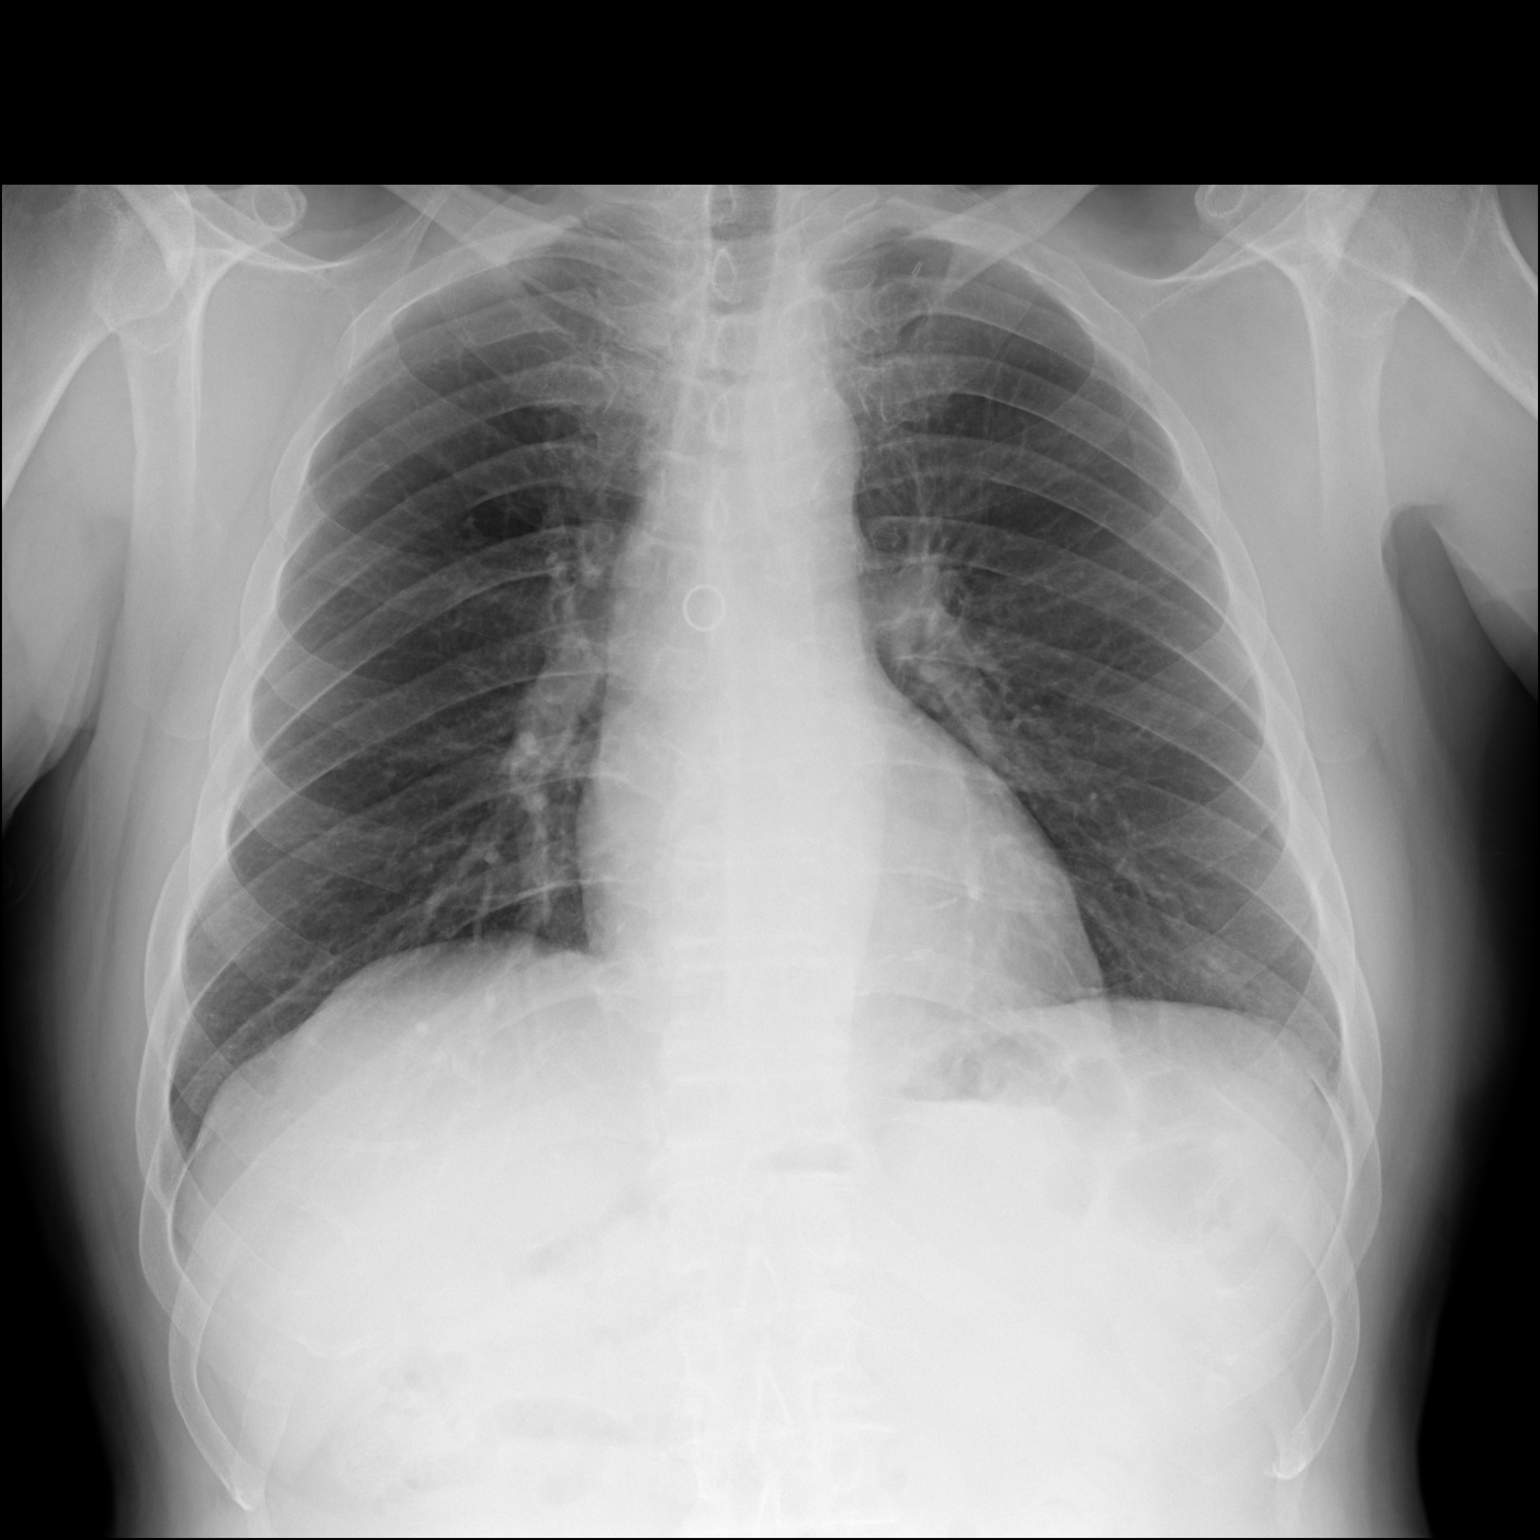

[dg chest 2 view (2 of 2)]
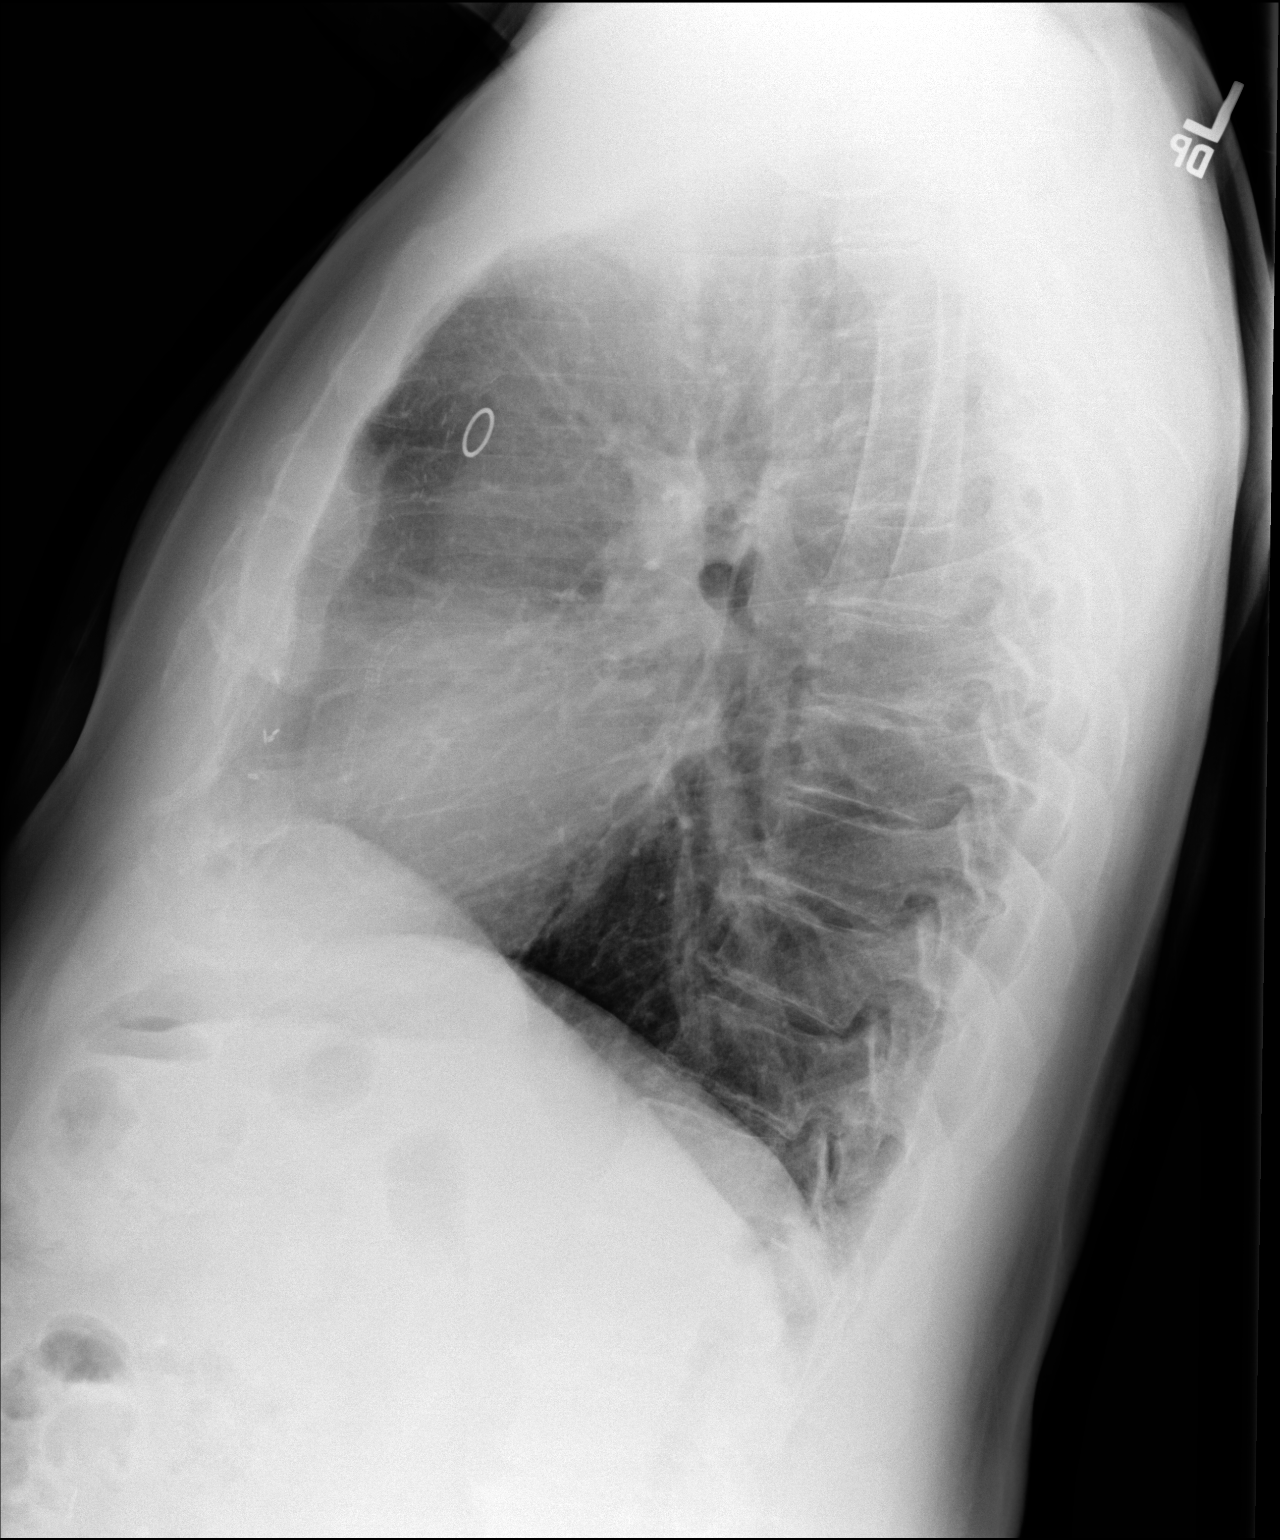

[2 of 2 positions shown; findings below may reference images not displayed]

FINDINGS: The lungs are adequately inflated and clear. The heart and pulmonary
vascularity are normal. The mediastinum is normal in width. There is
soft tissue fullness in the lower retrosternal region more
conspicuous today. There is no pleural effusion. The observed
portions of the thoracic spine exhibit no acute abnormalities.
IMPRESSION: Increased soft tissue density in the lower retrosternal region sense
sternal wire removal. This may reflect infection or bleeding. No
pneumothorax or pleural effusion. Given the patient's symptoms,
chest CT scanning is recommended.

These results will be called to the ordering clinician or
representative by the Radiologist Assistant, and communication
documented in the PACS or zVision Dashboard.

## 2017-10-02 ENCOUNTER — Emergency Department: Payer: Medicare HMO

## 2017-10-02 ENCOUNTER — Observation Stay (HOSPITAL_BASED_OUTPATIENT_CLINIC_OR_DEPARTMENT_OTHER)
Admit: 2017-10-02 | Discharge: 2017-10-02 | Disposition: A | Discharge status: Home / Self Care | Payer: Medicare HMO | Attending: Internal Medicine | Admitting: Internal Medicine

## 2017-10-02 ENCOUNTER — Observation Stay
Admission: EM | Admit: 2017-10-02 | Discharge: 2017-10-04 | Disposition: A | Discharge status: Home / Self Care | Payer: Medicare HMO | Attending: Internal Medicine | Admitting: Internal Medicine

## 2017-10-02 ENCOUNTER — Other Ambulatory Visit: Payer: Self-pay

## 2017-10-02 DIAGNOSIS — I2510 Atherosclerotic heart disease of native coronary artery without angina pectoris: Secondary | ICD-10-CM | POA: Diagnosis not present

## 2017-10-02 DIAGNOSIS — E785 Hyperlipidemia, unspecified: Secondary | ICD-10-CM | POA: Diagnosis not present

## 2017-10-02 DIAGNOSIS — E860 Dehydration: Secondary | ICD-10-CM | POA: Insufficient documentation

## 2017-10-02 DIAGNOSIS — Z87891 Personal history of nicotine dependence: Secondary | ICD-10-CM | POA: Diagnosis not present

## 2017-10-02 DIAGNOSIS — Z955 Presence of coronary angioplasty implant and graft: Secondary | ICD-10-CM | POA: Diagnosis not present

## 2017-10-02 DIAGNOSIS — Z79899 Other long term (current) drug therapy: Secondary | ICD-10-CM | POA: Diagnosis not present

## 2017-10-02 DIAGNOSIS — R0981 Nasal congestion: Secondary | ICD-10-CM | POA: Diagnosis not present

## 2017-10-02 DIAGNOSIS — R55 Syncope and collapse: Secondary | ICD-10-CM

## 2017-10-02 DIAGNOSIS — I951 Orthostatic hypotension: Secondary | ICD-10-CM | POA: Diagnosis not present

## 2017-10-02 DIAGNOSIS — R079 Chest pain, unspecified: Secondary | ICD-10-CM | POA: Diagnosis not present

## 2017-10-02 DIAGNOSIS — Z951 Presence of aortocoronary bypass graft: Secondary | ICD-10-CM | POA: Diagnosis not present

## 2017-10-02 DIAGNOSIS — I110 Hypertensive heart disease with heart failure: Secondary | ICD-10-CM | POA: Diagnosis not present

## 2017-10-02 DIAGNOSIS — I252 Old myocardial infarction: Secondary | ICD-10-CM | POA: Insufficient documentation

## 2017-10-02 DIAGNOSIS — Z7901 Long term (current) use of anticoagulants: Secondary | ICD-10-CM | POA: Insufficient documentation

## 2017-10-02 DIAGNOSIS — R0789 Other chest pain: Secondary | ICD-10-CM

## 2017-10-02 DIAGNOSIS — I25118 Atherosclerotic heart disease of native coronary artery with other forms of angina pectoris: Secondary | ICD-10-CM | POA: Diagnosis not present

## 2017-10-02 DIAGNOSIS — R072 Precordial pain: Secondary | ICD-10-CM | POA: Diagnosis not present

## 2017-10-02 DIAGNOSIS — I10 Essential (primary) hypertension: Secondary | ICD-10-CM | POA: Diagnosis not present

## 2017-10-02 DIAGNOSIS — Z86711 Personal history of pulmonary embolism: Secondary | ICD-10-CM | POA: Insufficient documentation

## 2017-10-02 DIAGNOSIS — Z8249 Family history of ischemic heart disease and other diseases of the circulatory system: Secondary | ICD-10-CM | POA: Insufficient documentation

## 2017-10-02 DIAGNOSIS — S60222A Contusion of left hand, initial encounter: Secondary | ICD-10-CM | POA: Diagnosis not present

## 2017-10-02 DIAGNOSIS — I5022 Chronic systolic (congestive) heart failure: Secondary | ICD-10-CM | POA: Insufficient documentation

## 2017-10-02 LAB — COMPREHENSIVE METABOLIC PANEL
ALK PHOS: 44 U/L (ref 38–126)
ALT: 23 U/L (ref 0–44)
AST: 24 U/L (ref 15–41)
Albumin: 3.2 g/dL — ABNORMAL LOW (ref 3.5–5.0)
Anion gap: 5 (ref 5–15)
BUN: 18 mg/dL (ref 8–23)
CALCIUM: 7.9 mg/dL — AB (ref 8.9–10.3)
CO2: 27 mmol/L (ref 22–32)
CREATININE: 1.29 mg/dL — AB (ref 0.61–1.24)
Chloride: 103 mmol/L (ref 98–111)
GFR, EST NON AFRICAN AMERICAN: 53 mL/min — AB (ref >60)
Glucose, Bld: 101 mg/dL — ABNORMAL HIGH (ref 70–99)
Potassium: 4.2 mmol/L (ref 3.5–5.1)
Sodium: 135 mmol/L (ref 135–145)
Total Bilirubin: 0.9 mg/dL (ref 0.3–1.2)
Total Protein: 6.1 g/dL — ABNORMAL LOW (ref 6.5–8.1)

## 2017-10-02 LAB — CBC
HCT: 43 % (ref 40.0–52.0)
Hemoglobin: 14.8 g/dL (ref 13.0–18.0)
MCH: 32.3 pg (ref 26.0–34.0)
MCHC: 34.5 g/dL (ref 32.0–36.0)
MCV: 93.8 fL (ref 80.0–100.0)
Platelets: 143 10*3/uL — ABNORMAL LOW (ref 150–440)
RBC: 4.59 MIL/uL (ref 4.40–5.90)
RDW: 14.5 % (ref 11.5–14.5)
WBC: 4.9 10*3/uL (ref 3.8–10.6)

## 2017-10-02 LAB — TROPONIN I: Troponin I: <0.03 ng/mL (ref <0.03)

## 2017-10-02 MED ORDER — ASPIRIN 81 MG PO CHEW
324.0000 mg | CHEWABLE_TABLET | ORAL | Status: AC
  Administered 2017-10-02: 324 mg via ORAL
  Filled 2017-10-02: qty 4

## 2017-10-02 MED ORDER — RANOLAZINE ER 500 MG PO TB12
1000.0000 mg | ORAL_TABLET | Freq: Two times a day (BID) | ORAL | Status: DC
  Administered 2017-10-02 – 2017-10-04 (×4): 1000 mg via ORAL
  Filled 2017-10-02 (×5): qty 2

## 2017-10-02 MED ORDER — ASPIRIN 300 MG RE SUPP: 300.0000 mg | RECTAL | Status: AC

## 2017-10-02 MED ORDER — SODIUM CHLORIDE 0.9% FLUSH
3.0000 mL | INTRAVENOUS | Status: DC | PRN
  Administered 2017-10-04 (×2): 3 mL via INTRAVENOUS
  Filled 2017-10-02 (×2): qty 3

## 2017-10-02 MED ORDER — ASPIRIN EC 81 MG PO TBEC
81.0000 mg | DELAYED_RELEASE_TABLET | Freq: Every day | ORAL | Status: DC
  Administered 2017-10-03 – 2017-10-04 (×2): 81 mg via ORAL
  Filled 2017-10-02 (×3): qty 1

## 2017-10-02 MED ORDER — NITROGLYCERIN 0.4 MG SL SUBL
0.4000 mg | SUBLINGUAL_TABLET | SUBLINGUAL | Status: DC | PRN
  Administered 2017-10-02 (×4): 0.4 mg via SUBLINGUAL
  Filled 2017-10-02 (×4): qty 1

## 2017-10-02 MED ORDER — SODIUM CHLORIDE 0.9 % IV BOLUS
500.0000 mL | Freq: Once | INTRAVENOUS | Status: AC
  Administered 2017-10-02: 500 mL via INTRAVENOUS

## 2017-10-02 MED ORDER — ONDANSETRON HCL 4 MG/2ML IJ SOLN
4.0000 mg | Freq: Four times a day (QID) | INTRAMUSCULAR | Status: DC | PRN
  Administered 2017-10-02: 4 mg via INTRAVENOUS
  Filled 2017-10-02: qty 2

## 2017-10-02 MED ORDER — RIVAROXABAN 20 MG PO TABS
20.0000 mg | ORAL_TABLET | Freq: Every day | ORAL | Status: DC
  Administered 2017-10-03 – 2017-10-04 (×2): 20 mg via ORAL
  Filled 2017-10-02 (×2): qty 1

## 2017-10-02 MED ORDER — SODIUM CHLORIDE 0.9 % IV SOLN: 250.0000 mL | INTRAVENOUS | Status: DC | PRN

## 2017-10-02 MED ORDER — SODIUM CHLORIDE 0.9% FLUSH
3.0000 mL | Freq: Two times a day (BID) | INTRAVENOUS | Status: DC
  Administered 2017-10-02 – 2017-10-04 (×3): 3 mL via INTRAVENOUS

## 2017-10-02 MED ORDER — NITROGLYCERIN 0.4 MG SL SUBL: 0.4000 mg | SUBLINGUAL_TABLET | SUBLINGUAL | Status: DC | PRN

## 2017-10-02 MED ORDER — ISOSORBIDE MONONITRATE ER 60 MG PO TB24
30.0000 mg | ORAL_TABLET | Freq: Every day | ORAL | Status: DC
  Administered 2017-10-02 – 2017-10-03 (×2): 30 mg via ORAL
  Filled 2017-10-02 (×2): qty 1

## 2017-10-02 MED ORDER — ATORVASTATIN CALCIUM 20 MG PO TABS
80.0000 mg | ORAL_TABLET | Freq: Every day | ORAL | Status: DC
  Administered 2017-10-02 – 2017-10-03 (×2): 80 mg via ORAL
  Filled 2017-10-02 (×2): qty 4

## 2017-10-02 MED ORDER — ACETAMINOPHEN 325 MG PO TABS
650.0000 mg | ORAL_TABLET | ORAL | Status: DC | PRN
  Administered 2017-10-03: 650 mg via ORAL
  Filled 2017-10-02: qty 2

## 2017-10-02 NOTE — ED Triage Notes (Signed)
Pt comes via [facility] from home with c/o chest pain. Pt states he took 1 nitro and apparently passed out. Pt doesn't recall what happened and also states some disorientation that occurred last night. EMS states Fire responded and pt's BP-65/48. EMS gave pt 4 zofran and about 500 fluids. Pt's vitals improved to BP-116/68. Pt is alert and oriented.

## 2017-10-02 NOTE — Progress Notes (Signed)
Patient with episode of chest pain at this time. Pain level = about "6 or 7", and patient states this episode isn't quite as bad as the original chest pain that brought him to the E.R. Vital signs taken, systolic B.P. only 375. Nitrostat PRN has been ordered, but isn't in stock on unit Pyxis. Called pharmacy and requested a couple doses be sent to the unit for this patient. Asked patient if he needed something for pain beyond the Nitrostat, patient declined. Will await Nitrostat dose and administer when becomes available. Will continue to monitor patient. Respiratory rate W.D.L. Multiple family members present. Wenda Low [facility]

## 2017-10-02 NOTE — ED Notes (Signed)
Pt started to have chest pain while Max PA student was speaking with him. [place] RN took EKG at this time.

## 2017-10-02 NOTE — H&P (Signed)
[facility] NAME: Scott [name]    MR#:  [id number]  DATE OF BIRTH:  09/27/1940  DATE OF ADMISSION:  10/02/2017  PRIMARY CARE PHYSICIAN: Baxter Hire, MD   REQUESTING/REFERRING PHYSICIAN:   CHIEF COMPLAINT:   Chief Complaint  Patient presents with  . Chest Pain    HISTORY OF PRESENT ILLNESS: Scott [name]  is a 77 y.o. male with a known history of coronary artery disease, cardiac stent placement, ischemic cardiomyopathy, carotid artery disease, chronic systolic heart failure follows up with [facility] health cardiology presented to the medicine for chest pain.  Patient this morning loaded the truck with frescue grass.  Later on he felt retrosternal chest discomfort.  He went to the sun room in his home then he passed out for a few seconds to a minute.  He took some nitroglycerin and his chest pain got relieved.  Patient has been compliant with his medications.  Had cardiac stress test in July 2019.  And he had last cardiac cath in November 2018.  Hospitalist service was consulted for further care.  PAST MEDICAL HISTORY:   Past Medical History:  Diagnosis Date  . Arthritis    right hip, since a fall  . CAD (coronary artery disease)    a. 04/2012 Cath: 3VD->Med Rx;  b. 04/2013 PCI RCA (2 DES); c. 03/2014 PCI: LAD 80 (3.0x23 Xience Alpine DES), 30 ISR in RCA; d. 06/2014 CABG x 2 ([facility]) LIMA->LAD, VG->OM; e. 12/2014 Cath ([facility]): patent grafts->Med Rx; f. 08/2015 Cath: patent grafts; g. 11/2016 Cath: LM min irregs, LAD 20p, 22m, LCX 100p/m, RCA 20ost, 10p/m/d, VG->OM1 nl, LIMA->dLAD nl.  . Cardiomyopathy, ischemic    a. 04/2012 Echo: EF 40-45%;  b. 08/2013 Echo: EF 45-50%, mild glob HK, mod lat/post HK, diast dysfxn, mildly dil LA, mild MR, nl RVSP; c. 01/2015 Echo: EF 40-45%, Gr 1 DD, mildly dil LA; 07/2016 Echo: EF 60-65%, no rwma, mild MR, mildly dil LA.  . Carotid arterial disease ([place])    a. 05/2013 Carotid U/S; bilat 40-50% ICA stenosis.  .  Chronic Chest Pain   . Chronic systolic CHF (congestive heart failure) ([place])    a. 01/2015 Echo: EF 40-45%; b. 07/2016 Echo: EF 60-65%.  . Cough   . Diverticulosis   . Dyspnea    pt. unsure of exertion cause- that creates SOB at times   . Headache 1970's   migraine  . History of blood transfusion 2016   post op  . Hyperlipidemia   . Hypertension   . Iron deficiency anemia due to chronic blood loss 09/11/2016  . Myocardial infarction [facility])    unsure of when  . Pulmonary emboli ([place]) 07/2016   a. On Xarelto.  . Reflux esophagitis   . Sternal pain    a. 03/2016 s/p Sternal wire removal; b. 05/2016 s/p redo median sternotomy for sternal debridement and sternal plating.  . Tubular adenoma of colon     PAST SURGICAL HISTORY:  Past Surgical History:  Procedure Laterality Date  . CARDIAC CATHETERIZATION  05/01/2012  . CARDIAC CATHETERIZATION  04/2013   [facility];x3 stent  . CARDIAC CATHETERIZATION  01/11/15    [facility]  . CARDIAC CATHETERIZATION N/A 09/16/2015   Procedure: LEFT HEART CATH AND CORS/GRAFTS ANGIOGRAPHY;  Surgeon: Minna Merritts, MD;  Location: [place] CV LAB;  Service: Cardiovascular;  Laterality: N/A;  . CARPAL TUNNEL RELEASE     right hand  . CATARACT EXTRACTION  LEFT  . CATARACT EXTRACTION W/PHACO Left 09/16/2014   Procedure: CATARACT EXTRACTION PHACO AND INTRAOCULAR LENS PLACEMENT (IOC);  Surgeon: Leandrew Koyanagi, MD;  Location: [place];  Service: Ophthalmology;  Laterality: Left;  . COLONOSCOPY    . COLONOSCOPY WITH PROPOFOL N/A 01/28/2016   Procedure: COLONOSCOPY WITH PROPOFOL;  Surgeon: Lollie Sails, MD;  Location[facility] ENDOSCOPY;  Service: Endoscopy;  Laterality: N/A;  . CORONARY ANGIOPLASTY WITH STENT PLACEMENT  04/13/2014  . CORONARY ARTERY BYPASS GRAFT  06-26-14   x3 bypasses  . DE QUERVAIN'S RELEASE Left 08/22/2012  . ESOPHAGOGASTRODUODENOSCOPY (EGD) WITH PROPOFOL N/A 04/26/2015   Procedure: ESOPHAGOGASTRODUODENOSCOPY (EGD) WITH PROPOFOL;   Surgeon: Hulen Luster, MD;  Location: [facility] ENDOSCOPY;  Service: Gastroenterology;  Laterality: N/A;  . ESOPHAGOGASTRODUODENOSCOPY (EGD) WITH PROPOFOL N/A 05/29/2017   Procedure: ESOPHAGOGASTRODUODENOSCOPY (EGD) WITH PROPOFOL;  Surgeon: Lollie Sails, MD;  Location: [facility] ENDOSCOPY;  Service: Endoscopy;  Laterality: N/A;  . LEFT HEART CATH AND CORONARY ANGIOGRAPHY N/A 12/04/2016   Procedure: LEFT HEART CATH AND CORS/GRAFTS  ANGIOGRAPHY;  Surgeon: Wellington Hampshire, MD;  Location: [place] CV LAB;  Service: Cardiovascular;  Laterality: N/A;  . RIB PLATING N/A 05/25/2016   Procedure: STERNAL PLATING;  Surgeon: Melrose Nakayama, MD;  Location: [place];  Service: Thoracic;  Laterality: N/A;  . right shoulder    . STERNAL WIRES REMOVAL N/A 03/30/2016   Procedure: STERNAL WIRES REMOVAL;  Surgeon: Melrose Nakayama, MD;  Location: [place];  Service: Thoracic;  Laterality: N/A;  . TONSILLECTOMY      SOCIAL HISTORY:  Social History   Tobacco Use  . Smoking status: Former Smoker    Packs/day: 1.00    Years: 35.00    Pack years: 35.00    Types: Cigarettes    Start date: 05/02/2009  . Smokeless tobacco: Former Systems developer  . Tobacco comment: quit in 2006  Substance Use Topics  . Alcohol use: No    FAMILY HISTORY:  Family History  Problem Relation Age of Onset  . Heart attack Father 56       MI  . Cancer Maternal Uncle     DRUG ALLERGIES:  Allergies  Allergen Reactions  . Contrast Media [Iodinated Diagnostic Agents] Hives  . Metrizamide   . Oxycodone Hcl Itching  . Vicodin [Hydrocodone-Acetaminophen] Itching    REVIEW OF SYSTEMS:   CONSTITUTIONAL: No fever, fatigue or weakness.  EYES: No blurred or double vision.  EARS, NOSE, AND THROAT: No tinnitus or ear pain.  RESPIRATORY: No cough, shortness of breath, wheezing or hemoptysis.  CARDIOVASCULAR: Has chest pain, no orthopnea, edema.  GASTROINTESTINAL: No nausea, vomiting, diarrhea or abdominal pain.  GENITOURINARY: No dysuria,  hematuria.  ENDOCRINE: No polyuria, nocturia,  HEMATOLOGY: No anemia, easy bruising or bleeding SKIN: No rash or lesion. MUSCULOSKELETAL: No joint pain or arthritis.   NEUROLOGIC: No tingling, numbness, weakness.  PSYCHIATRY: No anxiety or depression.   MEDICATIONS AT HOME:  Prior to Admission medications   Medication Sig Start Date End Date Taking? Authorizing Provider  atorvastatin (LIPITOR) 80 MG tablet TAKE ONE TABLET AT BED- TIME. Patient taking differently: Take 80 mg by mouth at bedtime.  03/01/17  Yes Minna Merritts, MD  isosorbide mononitrate (IMDUR) 30 MG 24 hr tablet Take 30 mg by mouth daily.   Yes [provider]  nitroGLYCERIN (NITROSTAT) 0.4 MG SL tablet Place 1 tablet (0.4 mg total) under the tongue every 5 (five) minutes as needed for chest pain. 04/26/17  Yes [name]  J, MD  ranolazine (RANEXA) 1000 MG SR tablet Take 1 tablet (1,000 mg total) by mouth 2 (two) times daily. 08/14/17  Yes Gollan, [name], MD  rivaroxaban (XARELTO) 20 MG TABS tablet Take 1 tablet (20 mg total) by mouth daily. 08/14/17  Yes Gollan, [name], MD      PHYSICAL EXAMINATION:   VITAL SIGNS: Blood pressure 123/89, pulse 65, temperature 99.1 F (37.3 C), temperature source Oral, resp. rate 18, height 5\' 10"  (1.778 m), weight 81.6 kg, SpO2 98 %.  GENERAL:  77 y.o.-year-old patient lying in the bed with no acute distress.  EYES: Pupils equal, round, reactive to light and accommodation. No scleral icterus. Extraocular muscles intact.  HEENT: Head atraumatic, normocephalic. Oropharynx and nasopharynx clear.  NECK:  Supple, no jugular venous distention. No thyroid enlargement, no tenderness.  LUNGS: Normal breath sounds bilaterally, no wheezing, rales,rhonchi or crepitation. No use of accessory muscles of respiration.  CARDIOVASCULAR: S1, S2 normal. No murmurs, rubs, or gallops.  ABDOMEN: Soft, nontender, nondistended. Bowel sounds present. No organomegaly or mass.  EXTREMITIES: No  pedal edema, cyanosis, or clubbing.  NEUROLOGIC: Cranial nerves II through XII are intact. Muscle strength 5/5 in all extremities. Sensation intact. Gait not checked.  PSYCHIATRIC: The patient is alert and oriented x 3.  SKIN: No obvious rash, lesion, or ulcer.   LABORATORY PANEL:   CBC Recent Labs  Lab 10/02/17 1310  WBC 4.9  HGB 14.8  HCT 43.0  PLT 143*  MCV 93.8  MCH 32.3  MCHC 34.5  RDW 14.5   ------------------------------------------------------------------------------------------------------------------  Chemistries  Recent Labs  Lab 10/02/17 1310  NA 135  K 4.2  CL 103  CO2 27  GLUCOSE 101*  BUN 18  CREATININE 1.29*  CALCIUM 7.9*  AST 24  ALT 23  ALKPHOS 44  BILITOT 0.9   ------------------------------------------------------------------------------------------------------------------ estimated creatinine clearance is 51.1 mL/min (A) (by C-G formula based on SCr of 1.29 mg/dL (H)). ------------------------------------------------------------------------------------------------------------------ No results for input(s): TSH, T4TOTAL, T3FREE, THYROIDAB in the last 72 hours.  Invalid input(s): FREET3   Coagulation profile No results for input(s): INR, PROTIME in the last 168 hours. ------------------------------------------------------------------------------------------------------------------- No results for input(s): DDIMER in the last 72 hours. -------------------------------------------------------------------------------------------------------------------  Cardiac Enzymes Recent Labs  Lab 10/02/17 1310  TROPONINI <0.03   ------------------------------------------------------------------------------------------------------------------ Invalid input(s): POCBNP  ---------------------------------------------------------------------------------------------------------------  Urinalysis    Component Value Date/Time   COLORURINE YELLOW (A)  12/08/2016 0938   APPEARANCEUR CLEAR (A) 12/08/2016 0938   LABSPEC 1.015 12/08/2016 0938   PHURINE 6.0 12/08/2016 0938   GLUCOSEU NEGATIVE 12/08/2016 0938   HGBUR SMALL (A) 12/08/2016 0938   BILIRUBINUR NEGATIVE 12/08/2016 0938   KETONESUR NEGATIVE 12/08/2016 0938   PROTEINUR NEGATIVE 12/08/2016 0938   NITRITE NEGATIVE 12/08/2016 0938   LEUKOCYTESUR NEGATIVE 12/08/2016 0938     RADIOLOGY: Dg Chest Portable 1 View  Result Date: 10/02/2017 CLINICAL DATA:  77 year old male with chest pain. Initial encounter. EXAM: PORTABLE CHEST 1 VIEW COMPARISON:  08/02/2017 chest x-ray. FINDINGS: Post CABG. Heart size within normal limits. Calcified minimally tortuous aorta. No infiltrate, congestive heart failure or pneumothorax. Acromioclavicular joint degenerative changes. No acute osseous abnormality. IMPRESSION: 1. Post CABG. 2. No infiltrate or congestive heart failure. 3.  Aortic Atherosclerosis (ICD10-I70.0). Electronically Signed   By: Genia Del M.D.   On: 10/02/2017 13:50   Dg Hand Complete Left  Result Date: 10/02/2017 CLINICAL DATA:  Fall with pain and swelling bruised and swollen EXAM: LEFT HAND - COMPLETE 3+ VIEW COMPARISON:  None. FINDINGS: No  fracture or malalignment. No radiopaque foreign body. Prominent cartilaginous calcification at the wrist. IMPRESSION: 1. No acute osseous abnormality. 2. Chondrocalcinosis Electronically Signed   By: Donavan Foil M.D.   On: 10/02/2017 14:50    EKG: Orders placed or performed during the hospital encounter of 10/02/17  . EKG 12-Lead  . EKG 12-Lead    IMPRESSION AND PLAN:  77 year old male patient with history of coronary disease, cardiac stent, hypertension, hyperlipidemia, ischemic cardiomyopathy, chronic systolic heart failure presented to the emergency room for chest pain  -Chest pain Admit patient to telemetry observation bed Start patient on aspirin, statin medication Cycle troponin cardiology consultation PRN morphine and nitrates  for chest pain Patient had recent cardiac stress testing in July 2019 Further work-up as per cardiology  -Chronic systolic heart failure Medical management continue  -Hyperlipidemia Continue statin medication  -DVT prophylaxis On anticoagulation with Xarelto  All the records are reviewed and case discussed with ED provider. Management plans discussed with the patient, family and they are in agreement.  CODE STATUS: Full code  Code Status History    Date Active Date Inactive Code Status Order ID Comments User Context   12/04/2016 1146 12/04/2016 1920 Full Code [id number]  Wellington Hampshire, MD Inpatient   11/19/2016 1715 11/21/2016 1731 Full Code [id number]  Idelle Crouch, MD Inpatient   11/07/2016 1614 11/08/2016 1957 Full Code [id number]  Vaughan Basta, MD Inpatient   08/04/2016 1535 08/09/2016 1723 Full Code [id number]  Epifanio Lesches, MD ED   05/25/2016 1422 05/26/2016 1712 Full Code [id number]  Nani Skillern, PA-C Inpatient   02/08/2015 1307 02/09/2015 1843 Full Code [id number]  Epifanio Lesches, MD ED   12/07/2014 1423 12/08/2014 1852 Full Code [id number]  Fritzi Mandes, MD Inpatient    Advance Directive Documentation     Most Recent Value  Type of Advance Directive  Healthcare Power of Attorney, Out of facility DNR (pink MOST or yellow form), Living will  Pre-existing out of facility DNR order (yellow form or pink MOST form)  -  "MOST" Form in Place?  -       TOTAL TIME TAKING CARE OF THIS PATIENT: 52 minutes.    Saundra Shelling M.D on 10/02/2017 at 3:02 PM  Between 7am to 6pm - Pager - [phone]  After 6pm go to [email or web address] - password [email or web address] [facility]  Office  [phone]  CC: Primary care physician; Baxter Hire, MD

## 2017-10-02 NOTE — ED Provider Notes (Signed)
[facility] Emergency Department Provider Note   ____________________________________________    I have reviewed the triage vital signs and the nursing notes.   HISTORY  Chief Complaint Chest Pain     HPI Scott [name] is a 77 y.o. male who presents with complaints of chest pain.  Patient reports that he was at the store picking up some feed for his animals and developed substernal pressure-like chest pain, he decided to take some nitroglycerin which he has done many times in the past for chest pain.  Apparently shortly thereafter he syncopized.  He did suffer an injury to his left lateral hand.  Denies headache or neck pain.  Intermittent chest pain continues.  History of a CABG   Past Medical History:  Diagnosis Date  . Arthritis    right hip, since a fall  . CAD (coronary artery disease)    a. 04/2012 Cath: 3VD->Med Rx;  b. 04/2013 PCI RCA (2 DES); c. 03/2014 PCI: LAD 80 (3.0x23 Xience Alpine DES), 30 ISR in RCA; d. 06/2014 CABG x 2 ([facility]) LIMA->LAD, VG->OM; e. 12/2014 Cath ([facility]): patent grafts->Med Rx; f. 08/2015 Cath: patent grafts; g. 11/2016 Cath: LM min irregs, LAD 20p, 47m, LCX 100p/m, RCA 20ost, 10p/m/d, VG->OM1 nl, LIMA->dLAD nl.  . Cardiomyopathy, ischemic    a. 04/2012 Echo: EF 40-45%;  b. 08/2013 Echo: EF 45-50%, mild glob HK, mod lat/post HK, diast dysfxn, mildly dil LA, mild MR, nl RVSP; c. 01/2015 Echo: EF 40-45%, Gr 1 DD, mildly dil LA; 07/2016 Echo: EF 60-65%, no rwma, mild MR, mildly dil LA.  . Carotid arterial disease ([place])    a. 05/2013 Carotid U/S; bilat 40-50% ICA stenosis.  . Chronic Chest Pain   . Chronic systolic CHF (congestive heart failure) ([place])    a. 01/2015 Echo: EF 40-45%; b. 07/2016 Echo: EF 60-65%.  . Cough   . Diverticulosis   . Dyspnea    pt. unsure of exertion cause- that creates SOB at times   . Headache 1970's   migraine  . History of blood transfusion 2016   post op  . Hyperlipidemia   . Hypertension   . Iron  deficiency anemia due to chronic blood loss 09/11/2016  . Myocardial infarction [facility])    unsure of when  . Pulmonary emboli ([place]) 07/2016   a. On Xarelto.  . Reflux esophagitis   . Sternal pain    a. 03/2016 s/p Sternal wire removal; b. 05/2016 s/p redo median sternotomy for sternal debridement and sternal plating.  . Tubular adenoma of colon     Patient Active Problem List   Diagnosis Date Noted  . Syncope 11/19/2016  . 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 Iron deficiency anemia due to chronic blood loss 09/11/2016  . Pulmonary embolus ([place]) 08/31/2016  . Panic disorder   . 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 Panic attack 08/04/2016  . 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 Coronary artery disease involving native coronary artery of native heart with angina pectoris with documented spasm ([place])   . Hx of CABG   . Ischemic cardiomyopathy   . Chronic systolic CHF (congestive heart failure) ([place])   . Chronic Chest Pain   . Coronary artery disease involving coronary bypass graft of native heart with angina pectoris with documented spasm ([place])   . Angina pectoris ([place])   . Anxiety   . Hyperlipemia   . Chronic diastolic heart failure ([place]) 12/21/2014  . Unstable angina ([place]) 12/08/2014  .  PAF (paroxysmal atrial fibrillation) ([place]) 12/08/2014  . Chronic anticoagulation 12/08/2014  . Anemia 04/20/2014  . 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 Cardiomyopathy, ischemic   . Essential hypertension   . Bilateral carotid artery disease ([place])   . S/P drug eluting coronary stent placement 05/30/2013  . Chest pain with low risk for cardiac etiology 05/05/2013  . SOB (shortness of breath) 05/05/2013  . Coronary artery disease involving coronary bypass graft of native heart with angina pectoris ([place]) 05/05/2013  . Hyperlipidemia 05/05/2013  . History of smoking 05/05/2013  . Cardiac angina ([place]) 05/05/2013    Past Surgical History:  Procedure Laterality Date  . CARDIAC  CATHETERIZATION  05/01/2012  . CARDIAC CATHETERIZATION  04/2013   [facility];x3 stent  . CARDIAC CATHETERIZATION  01/11/15    [facility]  . CARDIAC CATHETERIZATION N/A 09/16/2015   Procedure: LEFT HEART CATH AND CORS/GRAFTS ANGIOGRAPHY;  Surgeon: Minna Merritts, MD;  Location: [place] CV [facility];  Service: Cardiovascular;  Laterality: N/A;  . CARPAL TUNNEL RELEASE     right hand  . CATARACT EXTRACTION     LEFT  . CATARACT EXTRACTION W/PHACO Left 09/16/2014   Procedure: CATARACT EXTRACTION PHACO AND INTRAOCULAR LENS PLACEMENT (IOC);  Surgeon: Leandrew Koyanagi, MD;  Location: [place];  Service: Ophthalmology;  Laterality: Left;  . COLONOSCOPY    . COLONOSCOPY WITH PROPOFOL N/A 01/28/2016   Procedure: COLONOSCOPY WITH PROPOFOL;  Surgeon: Lollie Sails, MD;  Location: [facility] ENDOSCOPY;  Service: Endoscopy;  Laterality: N/A;  . CORONARY ANGIOPLASTY WITH STENT PLACEMENT  04/13/2014  . CORONARY ARTERY BYPASS GRAFT  06-26-14   x3 bypasses  . DE QUERVAIN'S RELEASE Left 08/22/2012  . ESOPHAGOGASTRODUODENOSCOPY (EGD) WITH PROPOFOL N/A 04/26/2015   Procedure: ESOPHAGOGASTRODUODENOSCOPY (EGD) WITH PROPOFOL;  Surgeon: Hulen Luster, MD;  Location[facility] ENDOSCOPY;  Service: Gastroenterology;  Laterality: N/A;  . ESOPHAGOGASTRODUODENOSCOPY (EGD) WITH PROPOFOL N/A 05/29/2017   Procedure: ESOPHAGOGASTRODUODENOSCOPY (EGD) WITH PROPOFOL;  Surgeon: Lollie Sails, MD;  Location: [facility] ENDOSCOPY;  Service: Endoscopy;  Laterality: N/A;  . LEFT HEART CATH AND CORONARY ANGIOGRAPHY N/A 12/04/2016   Procedure: LEFT HEART CATH AND CORS/GRAFTS  ANGIOGRAPHY;  Surgeon: Wellington Hampshire, MD;  Location: [place] CV [facility];  Service: Cardiovascular;  Laterality: N/A;  . RIB PLATING N/A 05/25/2016   Procedure: STERNAL PLATING;  Surgeon: Melrose Nakayama, MD;  Location: [place];  Service: Thoracic;  Laterality: N/A;  . right shoulder    . STERNAL WIRES REMOVAL N/A 03/30/2016   Procedure: STERNAL WIRES REMOVAL;  Surgeon: Melrose Nakayama, MD;  Location: [place];  Service: Thoracic;  Laterality: N/A;  . TONSILLECTOMY      Prior to Admission medications   Medication Sig Start Date End Date Taking? Authorizing Provider  atorvastatin (LIPITOR) 80 MG tablet TAKE ONE TABLET AT BED- TIME. 03/01/17   Minna Merritts, MD  dicyclomine (BENTYL) 10 MG capsule Take 10 mg by mouth 4 (four) times daily -  before meals and at bedtime.    [provider]  isosorbide mononitrate (IMDUR) 60 MG 24 hr tablet Take 60 mg by mouth daily.     [provider]  nitroGLYCERIN (NITROSTAT) 0.4 MG SL tablet Place 1 tablet (0.4 mg total) under the tongue every 5 (five) minutes as needed for chest pain. 04/26/17   Minna Merritts, MD  pantoprazole (PROTONIX) 40 MG tablet Take 40 mg by mouth daily.    [provider]  ranolazine (RANEXA) 1000 MG SR tablet Take 1 tablet (1,000  mg total) by mouth 2 (two) times daily. 08/14/17   Minna Merritts, MD  rivaroxaban (XARELTO) 20 MG TABS tablet Take 1 tablet (20 mg total) by mouth daily. 08/14/17   Minna Merritts, MD     Allergies Contrast media [iodinated diagnostic agents]; Metrizamide; Oxycodone hcl; and Vicodin [hydrocodone-acetaminophen]  Family History  Problem Relation Age of Onset  . Heart attack Father 50       MI  . Cancer Maternal Uncle     Social History Social History   Tobacco Use  . Smoking status: Former Smoker    Packs/day: 1.00    Years: 35.00    Pack years: 35.00    Types: Cigarettes    Start date: 05/02/2009  . Smokeless tobacco: Former Systems developer  . Tobacco comment: quit in 2006  Substance Use Topics  . Alcohol use: No  . Drug use: No    Review of Systems  Constitutional: No fever/chills Eyes: No visual changes.  ENT: No sore throat. Cardiovascular: As above Respiratory: Denies shortness of breath. Gastrointestinal: No abdominal pain.  Episode of diarrhea this morning Genitourinary: Negative for dysuria. Musculoskeletal: Negative for  back pain. Skin: Negative for rash. Neurological: Negative for headaches or weakness   ____________________________________________   PHYSICAL EXAM:  VITAL SIGNS: ED Triage Vitals  Enc Vitals Group     BP 10/02/17 1307 130/72     Pulse Rate 10/02/17 1307 63     Resp 10/02/17 1307 20     Temp 10/02/17 1307 99.1 F (37.3 C)     Temp Source 10/02/17 1307 Oral     SpO2 10/02/17 1307 99 %     Weight 10/02/17 1303 81.6 kg (180 lb)     Height 10/02/17 1303 1.778 m (5\' 10" )     Head Circumference --      Peak Flow --      Pain Score 10/02/17 1303 4     Pain Loc --      Pain Edu? --      Excl. in [place]? --     Constitutional: Alert and oriented. No acute distress. Pleasant and interactive  Nose: No congestion/rhinnorhea. Mouth/Throat: Mucous membranes are moist.   Neck:  Painless ROM Cardiovascular: Normal rate, regular rhythm. Grossly normal heart sounds.  Good peripheral circulation. Respiratory: Normal respiratory effort.  No retractions. Lungs CTAB. Gastrointestinal: Soft and nontender. No distention.  No CVA tenderness. Genitourinary: deferred Musculoskeletal: Left lateral dorsal hand swelling at the base of the fifth finger, warm and well perfused Neurologic:  Normal speech and language. No gross focal neurologic deficits are appreciated.  Skin:  Skin is warm, dry and intact.  Psychiatric: Mood and affect are normal. Speech and behavior are normal.  ____________________________________________   LABS (all labs ordered are listed, but only abnormal results are displayed)  Labs Reviewed  CBC - Abnormal; Notable for the following components:      Result Value   Platelets 143 (*)    All other components within normal limits  COMPREHENSIVE METABOLIC PANEL - Abnormal; Notable for the following components:   Glucose, Bld 101 (*)    Creatinine, Ser 1.29 (*)    Calcium 7.9 (*)    Total Protein 6.1 (*)    Albumin 3.2 (*)    GFR calc non Af Amer 53 (*)    All other  components within normal limits  TROPONIN I   ____________________________________________  EKG  ED ECG REPORT I, Lavonia Drafts, the attending physician, personally viewed and interpreted  this ECG.  Date: 10/02/2017  Rhythm: normal sinus rhythm QRS Axis: normal Intervals: normal ST/T Wave abnormalities: normal Narrative Interpretation: no evidence of acute ischemia  ____________________________________________  RADIOLOGY  Chest x-ray unremarkable Left hand x-ray no fracture on my read ____________________________________________   PROCEDURES  Procedure(s) performed: No  Procedures   Critical Care performed:No ____________________________________________   INITIAL IMPRESSION / ASSESSMENT AND PLAN / ED COURSE  Pertinent labs & imaging results that were available during my care of the patient were reviewed by me and considered in my medical decision making (see chart for details).  Patient with a history of CABG, sees Dr. Rockey Situ of cardiology who presents today with chest pain followed by syncope.  Initial EKG is reassuring, lab work is unremarkable.  Chest x-ray is benign.  Injury to the left hand appears to be a simple contusion, ice applied.  Given history and chest pain with syncope will admit to the hospital service    ____________________________________________   FINAL CLINICAL IMPRESSION(S) / ED DIAGNOSES  Final diagnoses:  Chest pain, unspecified type  Syncope, unspecified syncope type        Note:  This document was prepared using Dragon voice recognition software and may include unintentional dictation errors.    Lavonia Drafts, MD 10/02/17 [phone]

## 2017-10-02 NOTE — Progress Notes (Signed)
Pt was complaining of SOB and awas place on 2 liters oxygen. Nitrotablet was not available on pyxis. Called pharmacy x 2 to sent it to the floor. Pharmacy just sent nitro tablet. Will continue to monitor.

## 2017-10-02 NOTE — Progress Notes (Signed)
Advanced care plan. Purpose of the Encounter: CODE STATUS Parties in Attendance:Patient Patient's Decision Capacity: Good Subjective/Patient's story: Presented to the emergency room with chest pain Objective/Medical story Patient has a history of coronary disease, cardiac stent presented with chest pain relieved with nitroglycerin Needs cardiac work-up Goals of care determination:  Advance care directives and goals of care discussed with the patient Patient wants everything done which includes CPR, intubation and ventilator if the need arises CODE STATUS: Full code Time spent discussing advanced care planning: 16 minutes

## 2017-10-02 NOTE — ED Notes (Signed)
Called and attempted to give report. RN to call back shortly

## 2017-10-02 NOTE — Consult Note (Signed)
Cardiology Consultation:   Patient ID: DECKARD STUBER; [id number]; Dec 07, 1940   Admit date: 10/02/2017 Date of Consult: 10/02/2017  Primary Care Provider: Baxter Hire, MD Primary Cardiologist: Rockey Situ   Patient Profile:   Scott [name] is a 77 y.o. male with a hx of CAD s/p 2-vessel CABG with a LIMA to LAD and SVG to IM in 06/2014 with post-operative Afib with chronic chest pain not relieved by bypass surgery, post operative course was complicated by need to redo median sternotomy with sternal plating in 05/2016, chronic chest pain with several cardiac catheterizations since his CABG revealing stable coronary anatomy, ICM, PE in 07/2016 s/p Xarelto with workup through hematology, prior tobacco abuse, and anxiety who is being seen today for the evaluation of chronic angina at the request of Dr. Estanislado Pandy.  History of Present Illness:   Mr. Duce underwent cardiac cath in 04/2012 showed severe RCA disease with left-to-right collaterals, subtotally occluded LCx, and moderately diffuse LAD disease with medical management advised. Repeat LHC in 04/2013 with PCI/DES to the RCA. Repeat LHC in 03/2014 with PCI/DES to the mid LAD. Seen by [facility] in 05/2014 for 2nd opinion with cath showing 50% LAD stenosis and distal RCA disease. He underwent 2-vessel CABG in 06/2014 at [facility]. Follow up stress test in 11/2014 without ischemia. Cath at [facility] in 12/2014 without significant change. He continues to have frequent ED visits and hospital admissions for chest pain. He has underwent LHC in 08/2015 that shwoed a patent LIMA to LAD with a patent proximal to mid LAD stent, patent SVG to OM/ramus, small, diffusely diseased LCx not amenable to intervention with collaterals from left to left and right to left. The RCA was patent without significant disease. He most recently underwent LHC in 11/2016 that showed severe, underlying multivessel CAD with patent LIMA to LAD and patent SVG to OM. Patient stents in the LAD and RCA with  mild ISR. The native LAD was mostly filled via antegrade flow. He was noted to have CTO of the mid LCx with collaterals from the ramus. There was not significant change in his coronary anatomy since prior LHC in 2017. LVEF of 40%. Panic has been felt to be playing a role in his presentations with some of this improving with an SSRI. He was most recently admitted to the hospital in 07/2017 with chest pain. He underwent Lexiscan Myoview on 08/07/17 that showed no significant ischemia with a moderate in size, moderate in severity, fixed basal and mid inferior/inferolateral defect c/w scar vs artifact. EF 40-50%. This was an intermediate risk study and was not significant changed from prior study in 10/2016.   Patient recently traveled to [place] in 08/2017 for 2 weeks and did well while there. He denies having any symptoms concerning for angina. He has been in his usual state of health until this morning. He had picked up a 50 pound bag of grass seed along with some duck food. He was unloading these from his truck onto his Gator. He had alon laid some weights onto his Gator to assist with plugging the yard for over seeding. While doing this, he started to develop some substernal chest pain. He went into his sunroom and took a SL NTG. This seemed to help his chest pain. He then stood up and was walking towards the door to the dining room when he apparently suffered LOC. He thinks he hit his right hand on the door frame. He is uncertain how long he was out.  He denies any sensation of palpitations or worsening angina leading up to this. This is his first time ever passing out. He reports eating his typical breakfast of coffee and a muffin this morning. His wife called EMS. Per patient report, he was told his BP upon EMS arrival was "60/40."  Upon the patient's arrival to [facility] they were found to have stable vital signs. EKG showed NSR, 61 bpm, nonspecific IVCD, baseline wandering, no acute st/t changes, CXR showed no acute  process. Labs showed troponin negative x 1, Na 135, K+ 4.2, SCr 1.29, albumin 3.2. WBC 4.9, HGB 14.8, PLT 143. In the ED he was given IV fluids. Upon admission, he was continued on PTA medications and cardiology was asked to see. Currently, back to his baseline and is without chest pain. He is sitting in bed, comfortably watching TV.  Past Medical History:  Diagnosis Date  . Arthritis    right hip, since a fall  . CAD (coronary artery disease)    a. 04/2012 Cath: 3VD->Med Rx;  b. 04/2013 PCI RCA (2 DES); c. 03/2014 PCI: LAD 80 (3.0x23 Xience [place]pine DES), 30 ISR in RCA; d. 06/2014 CABG x 2 ([facility]) LIMA->LAD, VG->OM; e. 12/2014 Cath ([facility]): patent grafts->Med Rx; f. 08/2015 Cath: patent grafts; g. 11/2016 Cath: LM min irregs, LAD 20p, 29m, LCX 100p/m, RCA 20ost, 10p/m/d, VG->OM1 nl, LIMA->dLAD nl.  . Cardiomyopathy, ischemic    a. 04/2012 Echo: EF 40-45%;  b. 08/2013 Echo: EF 45-50%, mild glob HK, mod lat/post HK, diast dysfxn, mildly dil LA, mild MR, nl RVSP; c. 01/2015 Echo: EF 40-45%, Gr 1 DD, mildly dil LA; 07/2016 Echo: EF 60-65%, no rwma, mild MR, mildly dil LA.  . Carotid arterial disease ([place])    a. 05/2013 Carotid U/S; bilat 40-50% ICA stenosis.  . Chronic Chest Pain   . Chronic systolic CHF (congestive heart failure) ([place])    a. 01/2015 Echo: EF 40-45%; b. 07/2016 Echo: EF 60-65%.  . Cough   . Diverticulosis   . Dyspnea    pt. unsure of exertion cause- that creates SOB at times   . Headache 1970's   migraine  . History of blood transfusion 2016   post op  . Hyperlipidemia   . Hypertension   . Iron deficiency anemia due to chronic blood loss 09/11/2016  . Myocardial infarction [facility])    unsure of when  . Pulmonary emboli ([place]) 07/2016   a. On Xarelto.  . Reflux esophagitis   . Sternal pain    a. 03/2016 s/p Sternal wire removal; b. 05/2016 s/p redo median sternotomy for sternal debridement and sternal plating.  . Tubular adenoma of colon     Past Surgical History:  Procedure Laterality  Date  . CARDIAC CATHETERIZATION  05/01/2012  . CARDIAC CATHETERIZATION  04/2013   [facility];x3 stent  . CARDIAC CATHETERIZATION  01/11/15    [facility]  . CARDIAC CATHETERIZATION N/A 09/16/2015   Procedure: LEFT HEART CATH AND CORS/GRAFTS ANGIOGRAPHY;  Surgeon: Minna Merritts, MD;  Location: [place] CV LAB;  Service: Cardiovascular;  Laterality: N/A;  . CARPAL TUNNEL RELEASE     right hand  . CATARACT EXTRACTION     LEFT  . CATARACT EXTRACTION W/PHACO Left 09/16/2014   Procedure: CATARACT EXTRACTION PHACO AND INTRAOCULAR LENS PLACEMENT (IOC);  Surgeon: Leandrew Koyanagi, MD;  Location: [place];  Service: Ophthalmology;  Laterality: Left;  . COLONOSCOPY    . COLONOSCOPY WITH PROPOFOL N/A 01/28/2016   Procedure: COLONOSCOPY WITH PROPOFOL;  Surgeon:  Lollie Sails, MD;  Location: [facility] ENDOSCOPY;  Service: Endoscopy;  Laterality: N/A;  . CORONARY ANGIOPLASTY WITH STENT PLACEMENT  04/13/2014  . CORONARY ARTERY BYPASS GRAFT  06-26-14   x3 bypasses  . DE QUERVAIN'S RELEASE Left 08/22/2012  . ESOPHAGOGASTRODUODENOSCOPY (EGD) WITH PROPOFOL N/A 04/26/2015   Procedure: ESOPHAGOGASTRODUODENOSCOPY (EGD) WITH PROPOFOL;  Surgeon: Hulen Luster, MD;  Location: [facility] ENDOSCOPY;  Service: Gastroenterology;  Laterality: N/A;  . ESOPHAGOGASTRODUODENOSCOPY (EGD) WITH PROPOFOL N/A 05/29/2017   Procedure: ESOPHAGOGASTRODUODENOSCOPY (EGD) WITH PROPOFOL;  Surgeon: Lollie Sails, MD;  Location: [facility] ENDOSCOPY;  Service: Endoscopy;  Laterality: N/A;  . LEFT HEART CATH AND CORONARY ANGIOGRAPHY N/A 12/04/2016   Procedure: LEFT HEART CATH AND CORS/GRAFTS  ANGIOGRAPHY;  Surgeon: Wellington Hampshire, MD;  Location: [place] CV LAB;  Service: Cardiovascular;  Laterality: N/A;  . RIB PLATING N/A 05/25/2016   Procedure: STERNAL PLATING;  Surgeon: Melrose Nakayama, MD;  Location: [place];  Service: Thoracic;  Laterality: N/A;  . right shoulder    . STERNAL WIRES REMOVAL N/A 03/30/2016   Procedure: STERNAL WIRES REMOVAL;   Surgeon: Melrose Nakayama, MD;  Location: [place];  Service: Thoracic;  Laterality: N/A;  . TONSILLECTOMY       Home Meds: Prior to Admission medications   Medication Sig Start Date End Date Taking? Authorizing Provider  atorvastatin (LIPITOR) 80 MG tablet TAKE ONE TABLET AT BED- TIME. Patient taking differently: Take 80 mg by mouth at bedtime.  03/01/17  Yes Minna Merritts, MD  isosorbide mononitrate (IMDUR) 30 MG 24 hr tablet Take 30 mg by mouth daily.   Yes [provider]  nitroGLYCERIN (NITROSTAT) 0.4 MG SL tablet Place 1 tablet (0.4 mg total) under the tongue every 5 (five) minutes as needed for chest pain. 04/26/17  Yes Minna Merritts, MD  ranolazine (RANEXA) 1000 MG SR tablet Take 1 tablet (1,000 mg total) by mouth 2 (two) times daily. 08/14/17  Yes Gollan, [name], MD  rivaroxaban (XARELTO) 20 MG TABS tablet Take 1 tablet (20 mg total) by mouth daily. 08/14/17  Yes Minna Merritts, MD    Inpatient Medications: Scheduled Meds: . isosorbide mononitrate  30 mg Oral Daily   Continuous Infusions:  PRN Meds:   Allergies:   Allergies  Allergen Reactions  . Contrast Media [Iodinated Diagnostic Agents] Hives  . Metrizamide   . Oxycodone Hcl Itching  . Vicodin [Hydrocodone-Acetaminophen] Itching    Social History:   Social History   Socioeconomic History  . Marital status: Married    Spouse name: Not on file  . Number of children: Not on file  . Years of education: Not on file  . Highest education level: Not on file  Occupational History  . Not on file  Social Needs  . Financial resource strain: Not on file  . Food insecurity:    Worry: Not on file    Inability: Not on file  . Transportation needs:    Medical: Not on file    Non-medical: Not on file  Tobacco Use  . Smoking status: Former Smoker    Packs/day: 1.00    Years: 35.00    Pack years: 35.00    Types: Cigarettes    Start date: 05/02/2009  . Smokeless tobacco: Former Systems developer  . Tobacco  comment: quit in 2006  Substance and Sexual Activity  . Alcohol use: No  . Drug use: No  . Sexual activity: Yes  Lifestyle  . Physical activity:  Days per week: Not on file    Minutes per session: Not on file  . Stress: Not on file  Relationships  . Social connections:    Talks on phone: Not on file    Gets together: Not on file    Attends religious service: Not on file    Active member of club or organization: Not on file    Attends meetings of clubs or organizations: Not on file    Relationship status: Not on file  . Intimate partner violence:    Fear of current or ex partner: Not on file    Emotionally abused: Not on file    Physically abused: Not on file    Forced sexual activity: Not on file  Other Topics Concern  . Not on file  Social History Narrative  . Not on file     Family History:   Family History  Problem Relation Age of Onset  . Heart attack Father 50       MI  . Cancer Maternal Uncle     ROS:  Review of Systems  Constitutional: Positive for malaise/fatigue. Negative for chills, diaphoresis, fever and weight loss.  HENT: Negative for congestion.   Eyes: Negative for discharge and redness.  Respiratory: Negative for cough, hemoptysis, sputum production, shortness of breath and wheezing.   Cardiovascular: Positive for chest pain. Negative for palpitations, orthopnea, claudication, leg swelling and PND.  Gastrointestinal: Negative for abdominal pain, blood in stool, heartburn, melena, nausea and vomiting.  Genitourinary: Negative for hematuria.  Musculoskeletal: Positive for falls. Negative for myalgias.  Skin: Negative for rash.  Neurological: Positive for loss of consciousness and weakness. Negative for dizziness, tingling, tremors, sensory change, speech change and focal weakness.  Endo/Heme/Allergies: Does not bruise/bleed easily.  Psychiatric/Behavioral: Negative for substance abuse. The patient is nervous/anxious.   All other systems reviewed and  are negative.     Physical Exam/Data:   Vitals:   10/02/17 1330 10/02/17 1400 10/02/17 1430 10/02/17 1500  BP: 109/66 130/73 123/89 115/76  Pulse: 63 65 65 66  Resp: 18 (!) 28 18 17   Temp:      TempSrc:      SpO2: 97% 99% 98% 100%  Weight:      Height:        Intake/Output Summary (Last 24 hours) at 10/02/2017 1541 Last data filed at 10/02/2017 1511 Gross per 24 hour  Intake 543.14 ml  Output -  Net 543.14 ml   Filed Weights   10/02/17 1303  Weight: 81.6 kg   Body mass index is 25.83 kg/m.   Physical Exam: General: Well developed, well nourished, in no acute distress. Head: Normocephalic, atraumatic, sclera non-icteric, no xanthomas, nares without discharge.  Neck: Negative for carotid bruits. JVD not elevated. Lungs: Clear bilaterally to auscultation without wheezes, rales, or rhonchi. Breathing is unlabored. Heart: RRR with S1 S2. No murmurs, rubs, or gallops appreciated. Tenderness to palpation of the sternum.  Abdomen: Soft, non-tender, non-distended with normoactive bowel sounds. No hepatomegaly. No rebound/guarding. No obvious abdominal masses. Msk:  Strength and tone appear normal for age. Extremities: No clubbing or cyanosis. No edema. Distal pedal pulses are 2+ and equal bilaterally. Neuro: Alert and oriented X 3. No facial asymmetry. No focal deficit. Moves all extremities spontaneously. Psych:  Responds to questions appropriately with a normal affect.   EKG:  The EKG was personally reviewed and demonstrates: NSR, 61 bpm, nonspecific IVCD, baseline wandering, no acute st/t changes Telemetry:  Telemetry was personally reviewed and  demonstrates: NSR  Weights: Filed Weights   10/02/17 1303  Weight: 81.6 kg    Relevant CV Studies:  Myoview 07/2017:  Abnormal pharmacologic myocardial perfusion stress test.  There is a moderate in size, moderate in severity, fixed basal and mid inferior/inferolateral defect consistent with scar but cannot rule out an  element of artifact.  There is no significant ischemia.  Left ventricular systolic function is mildly to moderately reduced with basal and mid inferolateral hypokinesis (LVEF 51% by Siemens, 40% by [facility]).  This is an intermediate risk study.  No significant change since prior study on 11/08/16. __________   LHC 11/2016: Conclusion     Mid LAD lesion is 40% stenosed.  Post intervention, there is a -1% residual stenosis.  2nd Mrg lesion is 90% stenosed.  Post intervention, there is a -1% residual stenosis.  Ost RCA to Prox RCA lesion is 20% stenosed.  Prox RCA to Mid RCA lesion is 10% stenosed.  Post intervention, there is a -1% residual stenosis.  Mid RCA to Dist RCA lesion is 10% stenosed.  Post intervention, there is a -1% residual stenosis.  SVG and is normal in caliber.  LIMA and is normal in caliber.  The left ventricular ejection fraction is 35-45% by visual estimate.  There is mild to moderate left ventricular systolic dysfunction.  LV end diastolic pressure is normal.  Prox Cx to Mid Cx lesion is 100% stenosed.  Prox LAD lesion is 20% stenosed.   1.  Significant underlying three-vessel coronary artery disease with patent stents in the LAD and right coronary artery with only mild in-stent restenosis.  Patent LIMA to LAD but the native LAD is mostly filling via antegrade flow in the native vessel.  Patent SVG to ramus or high diagonal.  Chronically occluded mid left circumflex with collaterals from the ramus.  No significant change in coronary anatomy since most recent cardiac catheterization. 2.  Mildly to moderately reduced LV systolic function with an EF of 40%.  Normal left ventricular end-diastolic pressure.  Recommendations: Continue aggressive medical therapy.  The patient complained of significant dyspnea during the case that his vital signs were stable including oxygen saturation.  He was extremely anxious and appeared to have possibly a panic  attack.  __________   2-D echo 07/2016: Study Conclusions  - Left ventricle: The cavity size was normal. Systolic function was   normal. The estimated ejection fraction was in the range of 60%   to 65%. Wall motion was normal; there were no regional wall   motion abnormalities. - Mitral valve: There was mild regurgitation. - Left atrium: The atrium was mildly dilated. - Right ventricle: Systolic function was normal. - Pulmonary arteries: Systolic pressure was within the normal   range.  Impressions:  - Patient reportedly very anxious during the study. ___________   Laboratory Data:  Chemistry Recent Labs  Lab 10/02/17 1310  NA 135  K 4.2  CL 103  CO2 27  GLUCOSE 101*  BUN 18  CREATININE 1.29*  CALCIUM 7.9*  GFRNONAA 53*  GFRAA >60  ANIONGAP 5    Recent Labs  Lab 10/02/17 1310  PROT 6.1*  ALBUMIN 3.2*  AST 24  ALT 23  ALKPHOS 44  BILITOT 0.9   Hematology Recent Labs  Lab 10/02/17 1310  WBC 4.9  RBC 4.59  HGB 14.8  HCT 43.0  MCV 93.8  MCH 32.3  MCHC 34.5  RDW 14.5  PLT 143*   Cardiac Enzymes Recent Labs  Lab 10/02/17 1310  TROPONINI <0.03   No results for input(s): TROPIPOC in the last 168 hours.  BNPNo results for input(s): BNP, PROBNP in the last 168 hours.  DDimer No results for input(s): DDIMER in the last 168 hours.  Radiology/Studies:  Dg Chest Portable 1 View  Result Date: 10/02/2017 IMPRESSION: 1. Post CABG. 2. No infiltrate or congestive heart failure. 3.  Aortic Atherosclerosis (ICD10-I70.0). Electronically Signed   By: Genia Del M.D.   On: 10/02/2017 13:50   Dg Hand Complete Left  Result Date: 10/02/2017 IMPRESSION: 1. No acute osseous abnormality. 2. Chondrocalcinosis Electronically Signed   By: Donavan Foil M.D.   On: 10/02/2017 14:50    Assessment and Plan:   1. Syncope with suspected orthostatic hypotension: -Likely exacerbated by poor PO intake this morning coupled with working out in the heat followed by  taking a SL NTG -Recommend obtaining orthostatic vital signs -Hydrate as needed pending orthostatics -Monitor on telemetry  -Plan for outpatient cardiac monitoring at discharge -Labs consistent with dehydration   2. CAD s/p CABG with stable angina: -Currently, symptom free -Continue to cycle troponin to rule out -Numerous invasive and noninvasive studies over the past several years demonstrating stable coronary anatomy  -If troponin remains negative, no plans for inpatient ischemic evaluation -Continue PTA Xarelto (for unprovoked PE in 07/2016) in place of ASA -Continue Imdur and Ranexa -Not on beta blocker given relative hypotension in the past -Anxiety has been felt to be playing a significant role in the past  3. History of PE: -Remains on Xarelto -Per IM/PCP  4. HLD: -Lipitor   5. Anxiety: -Per IM   For questions or updates, please contact [facility] Please consult [email or web address] for contact info under Cardiology/STEMI.   Signed, Christell Faith, PA-C [place] Pager: [phone] 10/02/2017, 3:41 PM

## 2017-10-03 DIAGNOSIS — F419 Anxiety disorder, unspecified: Secondary | ICD-10-CM | POA: Diagnosis not present

## 2017-10-03 DIAGNOSIS — R69 Illness, unspecified: Secondary | ICD-10-CM | POA: Diagnosis not present

## 2017-10-03 DIAGNOSIS — I5022 Chronic systolic (congestive) heart failure: Secondary | ICD-10-CM | POA: Diagnosis not present

## 2017-10-03 DIAGNOSIS — I951 Orthostatic hypotension: Secondary | ICD-10-CM | POA: Diagnosis not present

## 2017-10-03 DIAGNOSIS — I25118 Atherosclerotic heart disease of native coronary artery with other forms of angina pectoris: Secondary | ICD-10-CM

## 2017-10-03 DIAGNOSIS — R55 Syncope and collapse: Secondary | ICD-10-CM

## 2017-10-03 DIAGNOSIS — R079 Chest pain, unspecified: Secondary | ICD-10-CM | POA: Diagnosis not present

## 2017-10-03 DIAGNOSIS — E785 Hyperlipidemia, unspecified: Secondary | ICD-10-CM | POA: Diagnosis not present

## 2017-10-03 LAB — BASIC METABOLIC PANEL
ANION GAP: 5 (ref 5–15)
BUN: 16 mg/dL (ref 8–23)
CALCIUM: 8.1 mg/dL — AB (ref 8.9–10.3)
CO2: 27 mmol/L (ref 22–32)
CREATININE: 1.17 mg/dL (ref 0.61–1.24)
Chloride: 105 mmol/L (ref 98–111)
GFR calc non Af Amer: 59 mL/min — ABNORMAL LOW (ref >60)
Glucose, Bld: 100 mg/dL — ABNORMAL HIGH (ref 70–99)
Potassium: 4.3 mmol/L (ref 3.5–5.1)
SODIUM: 137 mmol/L (ref 135–145)

## 2017-10-03 LAB — LIPID PANEL
CHOL/HDL RATIO: 2.8 ratio
Cholesterol: 128 mg/dL (ref 0–200)
HDL: 46 mg/dL (ref >40)
LDL Cholesterol: 68 mg/dL (ref 0–99)
Triglycerides: 70 mg/dL (ref <150)
VLDL: 14 mg/dL (ref 0–40)

## 2017-10-03 LAB — ECHOCARDIOGRAM COMPLETE
HEIGHTINCHES: 70 in
WEIGHTICAEL: 2880 [oz_av]

## 2017-10-03 LAB — TROPONIN I
Troponin I: <0.03 ng/mL (ref <0.03)
Troponin I: <0.03 ng/mL (ref <0.03)

## 2017-10-03 NOTE — Care Management Obs Status (Signed)
[place] NOTIFICATION   Patient Details  Name: Scott [name] MRN: [id number] Date of Birth: 1940-11-11   Medicare Observation Status Notification Given:  Yes    Elza Rafter, RN 10/03/2017, 10:22 AM

## 2017-10-03 NOTE — Plan of Care (Signed)
  Problem: Education: Goal: Knowledge of General Education information will improve Description Including pain rating scale, medication(s)/side effects and non-pharmacologic comfort measures 10/03/2017 0234 by Liliane Channel, RN Outcome: Progressing 10/03/2017 0233 by Liliane Channel, RN Outcome: Progressing   Problem: Health Behavior/Discharge Planning: Goal: Ability to manage health-related needs will improve 10/03/2017 0234 by Liliane Channel, RN Outcome: Progressing 10/03/2017 0233 by Liliane Channel, RN Outcome: Progressing   Problem: Clinical Measurements: Goal: Will remain free from infection Outcome: Progressing   Problem: Pain Managment: Goal: General experience of comfort will improve 10/03/2017 0234 by Liliane Channel, RN Outcome: Progressing 10/03/2017 0233 by Liliane Channel, RN Outcome: Progressing

## 2017-10-03 NOTE — Care Management Note (Signed)
Case Management Note  Patient Details  Name: Scott [name] MRN: [id number] Date of Birth: 1940-05-07  Subjective/Objective:      [name] is independent from home.  Lives with wife. Denies transportation issues.  Patient is on chronic Xarelto.  States after the next 7 pills he will be in the donut hole and his Xarelto will cost around $400.00.  Contacted Medication Management and they said he could fill out an application and bring by at discharge with a printout from his pharmacy.  Notified patient and provided application.  [name] is currently on 2L O2 [place] acute.  Spoke with RN about weaning O2.  No current services in the home.               Action/Plan:   Expected Discharge Date:  10/03/17               Expected Discharge Plan:  Home/Self Care  In-House Referral:     Discharge planning Services  Medication Assistance, CM Consult  Post Acute Care Choice:    Choice offered to:     DME Arranged:    DME Agency:     HH Arranged:    HH Agency:     Status of Service:  In process, will continue to follow  If discussed at Long Length of Stay Meetings, dates discussed:    Additional Comments:  Elza Rafter, RN 10/03/2017, 10:31 AM

## 2017-10-03 NOTE — Progress Notes (Addendum)
Progress Note  Patient Name: Scott [name] Date of Encounter: 10/03/2017  Primary Cardiologist: Rockey Situ  Subjective   No acute overnight events. Remains with positive orthostatic vital signs this morning with drop in SBP from 114-->94 from sitting to standing. Ruled out. Echo showed preserved LVSF as below.   Had some chest pain overnight requiring SL NTG. No chest pain currently. Renal function improved from 1.29-->1.17.   Inpatient Medications    Scheduled Meds: . aspirin EC  81 mg Oral Daily  . atorvastatin  80 mg Oral q1800  . isosorbide mononitrate  30 mg Oral Daily  . ranolazine  1,000 mg Oral BID  . rivaroxaban  20 mg Oral Daily  . sodium chloride flush  3 mL Intravenous Q12H   Continuous Infusions: . sodium chloride     PRN Meds: sodium chloride, acetaminophen, nitroGLYCERIN, ondansetron (ZOFRAN) IV, sodium chloride flush   Vital Signs    Vitals:   10/03/17 0432 10/03/17 0650 10/03/17 0651 10/03/17 0652  BP: 132/71 120/76 114/71 94/68  Pulse: 67 66 69 66  Resp: 18     Temp: 98.1 F (36.7 C)     TempSrc: Oral     SpO2: 99% 97% 97%   Weight:      Height:        Intake/Output Summary (Last 24 hours) at 10/03/2017 0944 Last data filed at 10/03/2017 0500 Gross per 24 hour  Intake 783.14 ml  Output 400 ml  Net 383.14 ml   Filed Weights   10/02/17 1303  Weight: 81.6 kg    Telemetry    NSR - Personally Reviewed  ECG    n/a - Personally Reviewed  Physical Exam   GEN: No acute distress.   Neck: No JVD. Cardiac: RRR, no murmurs, rubs, or gallops.  Respiratory: Clear to auscultation bilaterally.  GI: Soft, nontender, non-distended.   MS: No edema; No deformity. Neuro:  Alert and oriented x 3; Nonfocal.  Psych: Normal affect.  Labs    Chemistry Recent Labs  Lab 10/02/17 1310 10/03/17 0352  NA 135 137  K 4.2 4.3  CL 103 105  CO2 27 27  GLUCOSE 101* 100*  BUN 18 16  CREATININE 1.29* 1.17  CALCIUM 7.9* 8.1*  PROT 6.1*  --   ALBUMIN  3.2*  --   AST 24  --   ALT 23  --   ALKPHOS 44  --   BILITOT 0.9  --   GFRNONAA 53* 59*  GFRAA >60 >60  ANIONGAP 5 5     Hematology Recent Labs  Lab 10/02/17 1310  WBC 4.9  RBC 4.59  HGB 14.8  HCT 43.0  MCV 93.8  MCH 32.3  MCHC 34.5  RDW 14.5  PLT 143*    Cardiac Enzymes Recent Labs  Lab 10/02/17 1310 10/02/17 1617 10/02/17 2258 10/03/17 0352  TROPONINI <0.03 <0.03 <0.03 <0.03   No results for input(s): TROPIPOC in the last 168 hours.   BNPNo results for input(s): BNP, PROBNP in the last 168 hours.   DDimer No results for input(s): DDIMER in the last 168 hours.   Radiology    Dg Chest Portable 1 View  Result Date: 10/02/2017 IMPRESSION: 1. Post CABG. 2. No infiltrate or congestive heart failure. 3.  Aortic Atherosclerosis (ICD10-I70.0). Electronically Signed   By: Genia Del M.D.   On: 10/02/2017 13:50   Dg Hand Complete Left  Result Date: 10/02/2017 IMPRESSION: 1. No acute osseous abnormality. 2. Chondrocalcinosis Electronically Signed  By: Donavan Foil M.D.   On: 10/02/2017 14:50    Cardiac Studies   2-D echo 10/02/2017: Study Conclusions  - Left ventricle: The cavity size was normal. Systolic function was   normal. The estimated ejection fraction was in the range of 55%   to 60%. Wall motion was normal; there were no regional wall   motion abnormalities. Unable to exclude basal inferior wall   hypokineiss. Not well visualized. Doppler parameters are   consistent with abnormal left ventricular relaxation (grade 1   diastolic dysfunction). - Left atrium: The atrium was mildly dilated. - Right ventricle: Systolic function was normal. - Pulmonary arteries: Systolic pressure was within the normal   range.  Patient Profile     77 y.o. male with history of CAD s/p 2-vessel CABG with a LIMA to LAD and SVG to IM in 06/2014 with post-operative Afib with chronic chest pain not relieved by bypass surgery, post operative course was complicated by need  to redo median sternotomy with sternal plating in 05/2016, chronic chest pain with several cardiac catheterizations since his CABG revealing stable coronary anatomy, ICM, PE in 07/2016 s/p Xarelto with workup through hematology, prior tobacco abuse, and anxiety who is being seen today for the evaluation of chronic angina and orthostatic hypotension.   Assessment & Plan    1. Syncope with orthostatic hypotension: -Remains orthostatic despite IV fluids on 9/10 -Likely exacerbated by extra Imdur given at time of admission.. He had already taken his 60 mg of Imdur at home on 9/10. Upon admission, IM ordered an additional 30 mg of Imdur which was given in the PM of 9/10. He was given another 30 mg of Imdur this morning. This equals double of his typical dose -Push fluids -Will need to recheck orthostatic BP this afternoon  -Monitor on telemetry  -Plan for outpatient cardiac monitoring at discharge -Labs consistent with dehydration   2. CAD s/p CABG with stable angina: -Currently, symptom free -Has ruled out -Numerous invasive and noninvasive studies over the past several years demonstrating stable coronary anatomy  -No plans for inpatient ischemic evaluation  -Continue PTA Xarelto (for unprovoked PE in 07/2016) in place of ASA -Continue Ranexa -Hold Imdur for now -May need to decrease Imdur to 15 mg to 30 mg daily (PTA on 60 mg daily) -Not on beta blocker given relative hypotension in the past -Anxiety has been felt to be playing a significant role in the past  3. History of PE: -Remains on Xarelto -Per IM/PCP  4. HLD: -Lipitor  -LDL at goal   5. Anxiety: -Per IM   For questions or updates, please contact [facility] Please consult [email or web address] for contact info under Cardiology/STEMI.    Signed, Christell Faith, PA-C [place] Pager: [phone] 10/03/2017, 9:44 AM  Attending Note Patient seen and examined, agree with detailed note above,  Patient presentation and  plan discussed on rounds.   No events overnight Reports that he took isosorbide 60 mg yesterday morning, had additional 30 mg yesterday afternoon, was orthostatic this morning by nursing numbers,  Received additional isosorbide 30 mg this morning Comfortable in bed no dizziness when laying supine Discussed recent events that led him to coming to the hospital  Had syncope after getting up from a chair, with a Badik on arrival Poor fluid intake, when he working outside in the hot sun  On physical examination no significant JVD lungs clear to auscultation bilaterally heart sounds regular normal S1-S2 no murmurs appreciated abdomen soft nontender no  significant lower extremity edema  Lab work reviewed showing normal troponin x3 sodium 137 creatinine 1.17 BUN 16 total cholesterol 128  Echocardiogram reviewed with him in detail Essentially normal ejection fraction no significant valve disease EF 55%  A/p: Syncope Likely secondary to orthostasis, exacerbated by mild dehydration and isosorbide No significant arrhythmia on telemetry Normal echocardiogram -We will recommend we decrease isosorbide down to 30 mg daily Aggressive oral fluid hydration Stressed the importance that he drink fluids when he is out working in the garden  Chronic chest pain, angina No further ischemic work-up at this time Cardiac enzymes negative and normal ejection fraction  History of pulmonary embolism Would continue anticoagulation xarelto  Anxiety Prior history of panic attacks PTSD concerning his cardiac issues requiring frequent work-up for atypical symptoms  Long discussion with patient and patient's wife at the bedside Would recommend close monitoring with orthostatics  Before any discharge Greater than 50% was spent in counseling and coordination of care with patient Total encounter time 35 minutes or more   Signed: Esmond Plants  M.D., Ph.D. [facility]

## 2017-10-03 NOTE — Progress Notes (Signed)
[place] [facility] [place] NAME: Scott [name]    MR#:  [id number]  DATE OF BIRTH:  [birth year]  SUBJECTIVE:  CHIEF COMPLAINT: Patient's chest pain improved denies any dizziness but being orthostatic  REVIEW OF SYSTEMS:  CONSTITUTIONAL: No fever, fatigue or weakness.  EYES: No blurred or double vision.  EARS, NOSE, AND THROAT: No tinnitus or ear pain.  RESPIRATORY: No cough, shortness of breath, wheezing or hemoptysis.  CARDIOVASCULAR: No chest pain, orthopnea, edema.  GASTROINTESTINAL: No nausea, vomiting, diarrhea or abdominal pain.  GENITOURINARY: No dysuria, hematuria.  ENDOCRINE: No polyuria, nocturia,  HEMATOLOGY: No anemia, easy bruising or bleeding SKIN: No rash or lesion. MUSCULOSKELETAL: No joint pain or arthritis.   NEUROLOGIC: No tingling, numbness, weakness.  PSYCHIATRY: No anxiety or depression.   DRUG ALLERGIES:   Allergies  Allergen Reactions  . Contrast Media [Iodinated Diagnostic Agents] Hives  . Metrizamide   . Oxycodone Hcl Itching  . Vicodin [Hydrocodone-Acetaminophen] Itching    VITALS:  Blood pressure 94/68, pulse 66, temperature 98.1 F (36.7 C), temperature source Oral, resp. rate 18, height 5\' 10"  (1.778 m), weight 81.6 kg, SpO2 97 %.  PHYSICAL EXAMINATION:  GENERAL:  77 y.o.-year-old patient lying in the bed with no acute distress.  EYES: Pupils equal, round, reactive to light and accommodation. No scleral icterus. Extraocular muscles intact.  HEENT: Head atraumatic, normocephalic. Oropharynx and nasopharynx clear.  NECK:  Supple, no jugular venous distention. No thyroid enlargement, no tenderness.  LUNGS: Normal breath sounds bilaterally, no wheezing, rales,rhonchi or crepitation. No use of accessory muscles of respiration.  CARDIOVASCULAR: S1, S2 normal. No murmurs, rubs, or gallops.  ABDOMEN: Soft, nontender, nondistended. Bowel sounds present. No organomegaly or mass.  EXTREMITIES: No pedal edema,  cyanosis, or clubbing.  NEUROLOGIC: Cranial nerves II through XII are intact. Muscle strength 5/5 in all extremities. Sensation intact. Gait not checked.  PSYCHIATRIC: The patient is alert and oriented x 3.  SKIN: No obvious rash, lesion, or ulcer.    LABORATORY PANEL:   CBC Recent Labs  Lab 10/02/17 1310  WBC 4.9  HGB 14.8  HCT 43.0  PLT 143*   ------------------------------------------------------------------------------------------------------------------  Chemistries  Recent Labs  Lab 10/02/17 1310 10/03/17 0352  NA 135 137  K 4.2 4.3  CL 103 105  CO2 27 27  GLUCOSE 101* 100*  BUN 18 16  CREATININE 1.29* 1.17  CALCIUM 7.9* 8.1*  AST 24  --   ALT 23  --   ALKPHOS 44  --   BILITOT 0.9  --    ------------------------------------------------------------------------------------------------------------------  Cardiac Enzymes Recent Labs  Lab 10/03/17 0352  TROPONINI <0.03   ------------------------------------------------------------------------------------------------------------------  RADIOLOGY:  Dg Chest Portable 1 View  Result Date: 10/02/2017 CLINICAL DATA:  77 year old male with chest pain. Initial encounter. EXAM: PORTABLE CHEST 1 VIEW COMPARISON:  08/02/2017 chest x-ray. FINDINGS: Post CABG. Heart size within normal limits. Calcified minimally tortuous aorta. No infiltrate, congestive heart failure or pneumothorax. Acromioclavicular joint degenerative changes. No acute osseous abnormality. IMPRESSION: 1. Post CABG. 2. No infiltrate or congestive heart failure. 3.  Aortic Atherosclerosis (ICD10-I70.0). Electronically Signed   By: Genia Del M.D.   On: 10/02/2017 13:50   Dg Hand Complete Left  Result Date: 10/02/2017 CLINICAL DATA:  Fall with pain and swelling bruised and swollen EXAM: LEFT HAND - COMPLETE 3+ VIEW COMPARISON:  None. FINDINGS: No fracture or malalignment. No radiopaque foreign body. Prominent cartilaginous calcification at the wrist.  IMPRESSION: 1. No acute osseous abnormality.  2. Chondrocalcinosis Electronically Signed   By: Donavan Foil M.D.   On: 10/02/2017 14:50    EKG:   Orders placed or performed during the hospital encounter of 10/02/17  . EKG 12-Lead  . EKG 12-Lead    ASSESSMENT AND PLAN:   77 year old male patient with history of coronary disease, cardiac stent, hypertension, hyperlipidemia, ischemic cardiomyopathy, chronic systolic heart failure presented to the emergency room for chest pain  -Chest pain with history of coronary artery disease Acute MI ruled out with negative troponins  patient on aspirin, statin medication PRN morphine and nitrates for chest pain Patient had recent cardiac stress testing in July 2019 Echo with preserved left ventricular systolic function Further work-up as per cardiology-[facility]  -Syncope secondary to orthostatic hypotension Could be from Imdur , hold Imdur IV fluids  -Chronic systolic heart failure Medical management continue Not fluid overloaded  -History of pulmonary embolism continue Xarelto  -Hyperlipidemia Continue statin medication  -DVT prophylaxis On anticoagulation with Xarelto     All the records are reviewed and case discussed with Care Management/Social Workerr. Management plans discussed with the patient, family and they are in agreement.  CODE STATUS: fc   TOTAL TIME TAKING CARE OF THIS PATIENT: 36 minutes.   POSSIBLE D/C IN 1-2  DAYS, DEPENDING ON CLINICAL CONDITION.  Note: This dictation was prepared with Dragon dictation along with smaller phrase technology. Any transcriptional errors that result from this process are unintentional.   Nicholes Mango M.D on 10/03/2017 at 4:21 PM  Between 7am to 6pm - Pager - [phone] After 6pm go to [email or web address] - password [email or web address] [facility]  Office  [phone]  CC: Primary care physician; Baxter Hire, MD

## 2017-10-04 ENCOUNTER — Encounter: Payer: Self-pay | Admitting: Nurse Practitioner

## 2017-10-04 DIAGNOSIS — R69 Illness, unspecified: Secondary | ICD-10-CM | POA: Diagnosis not present

## 2017-10-04 DIAGNOSIS — I25118 Atherosclerotic heart disease of native coronary artery with other forms of angina pectoris: Secondary | ICD-10-CM | POA: Diagnosis not present

## 2017-10-04 DIAGNOSIS — R55 Syncope and collapse: Secondary | ICD-10-CM | POA: Diagnosis not present

## 2017-10-04 DIAGNOSIS — I5022 Chronic systolic (congestive) heart failure: Secondary | ICD-10-CM | POA: Diagnosis not present

## 2017-10-04 DIAGNOSIS — F411 Generalized anxiety disorder: Secondary | ICD-10-CM

## 2017-10-04 DIAGNOSIS — R079 Chest pain, unspecified: Secondary | ICD-10-CM | POA: Diagnosis not present

## 2017-10-04 DIAGNOSIS — E785 Hyperlipidemia, unspecified: Secondary | ICD-10-CM | POA: Diagnosis not present

## 2017-10-04 DIAGNOSIS — I951 Orthostatic hypotension: Secondary | ICD-10-CM | POA: Diagnosis not present

## 2017-10-04 LAB — GLUCOSE, CAPILLARY: Glucose-Capillary: 79 mg/dL (ref 70–99)

## 2017-10-04 MED ORDER — ISOSORBIDE MONONITRATE ER 30 MG PO TB24
30.0000 mg | ORAL_TABLET | Freq: Every day | ORAL | Status: DC
  Administered 2017-10-04: 30 mg via ORAL
  Filled 2017-10-04: qty 1

## 2017-10-04 MED ORDER — ATORVASTATIN CALCIUM 80 MG PO TABS: 80.0000 mg | ORAL_TABLET | Freq: Every day | ORAL | 0 refills | Status: DC

## 2017-10-04 MED ORDER — ASPIRIN 81 MG PO TBEC: 81.0000 mg | DELAYED_RELEASE_TABLET | Freq: Every day | ORAL | Status: DC

## 2017-10-04 MED ORDER — ISOSORBIDE MONONITRATE ER 30 MG PO TB24: 15.0000 mg | ORAL_TABLET | Freq: Every day | ORAL | 0 refills | Status: DC

## 2017-10-04 NOTE — Care Management Note (Signed)
Case Management Note  Patient Details  Name: Scott [name] MRN: [id number] Date of Birth: 10/22/1940  Subjective/Objective:    Possible patient discharge today after Cardiology sees patient.  Denies medications needs and transportation is not a problem.  Remains on room air.  No further needs identified at this time.                   Action/Plan:   Expected Discharge Date:  10/04/17               Expected Discharge Plan:  Home/Self Care  In-House Referral:     Discharge planning Services  Medication Assistance, CM Consult  Post Acute Care Choice:    Choice offered to:     DME Arranged:    DME Agency:     HH Arranged:    HH Agency:     Status of Service:  Completed, signed off  If discussed at [employer] of Stay Meetings, dates discussed:    Additional Comments:  Elza Rafter, RN 10/04/2017, 12:00 PM

## 2017-10-04 NOTE — Care Management Note (Signed)
Case Management Note  Patient Details  Name: OTHAR CURTO MRN: [id number] Date of Birth: May 10, 1940  Subjective/Objective:                    Action/Plan:   Expected Discharge Date:  10/04/17               Expected Discharge Plan:  Home/Self Care  In-House Referral:     Discharge planning Services  Medication Assistance, CM Consult  Post Acute Care Choice:    Choice offered to:     DME Arranged:    DME Agency:     HH Arranged:    [place] Agency:     Status of Service:  Completed, signed off  If discussed at [employer] of Stay Meetings, dates discussed:    Additional Comments:  Elza Rafter, RN 10/04/2017, 3:15 PM

## 2017-10-04 NOTE — Progress Notes (Signed)
Ambulated patient to the hall way, he complaints of dizziness, let him stand to rest and almost passed out. Patient's VSS, HR was at 62 per CCMD did not brady down. Talked to Dr. Margaretmary Eddy and Sharolyn Douglas, NP about this, will possibly hold discharge. Discontinue Imdur and will check orthostatics VS. RN will continue to monitor.

## 2017-10-04 NOTE — Progress Notes (Signed)
Went over discharge instructions including medications and follow-up appointment with the patient and wife. Discontinue peripheral IV and telemetry monitor. Volunteer was here for transport.

## 2017-10-04 NOTE — Discharge Instructions (Addendum)
°  Follow up with Primary care physician in 3 to 4 days Follow-up with cardiology Dr. Rockey Situ in 1 week    Information on my medicine - XARELTO (rivaroxaban)  [place]? Xarelto was prescribed to treat blood clots that may have been found in the veins of your legs (deep vein thrombosis) or in your lungs (pulmonary embolism) and to reduce the risk of them occurring again.  What do you need to know about Xarelto? Take one 20 mg tablet taken ONCE A DAY with your evening meal.  DO NOT stop taking Xarelto without talking to the health care provider who prescribed the medication.  Refill your prescription for 20 mg tablets before you run out.  After discharge, you should have regular check-up appointments with your healthcare provider that is prescribing your Xarelto.  In the future your dose may need to be changed if your kidney function changes by a significant amount.  What do you do if you miss a dose?  If you miss a dose, take it as soon as you remember on the same day then continue your regularly scheduled once daily regimen the next day. Do not take two doses of Xarelto at the same time.   Important Safety Information Xarelto is a blood thinner medicine that can cause bleeding. You should call your healthcare provider right away if you experience any of the following: ? Bleeding from an injury or your nose that does not stop. ? Unusual colored urine (red or dark brown) or unusual colored stools (red or black). ? Unusual bruising for unknown reasons. ? A serious fall or if you hit your head (even if there is no bleeding).  Some medicines may interact with Xarelto and might increase your risk of bleeding while on Xarelto. To help avoid this, consult your healthcare provider or pharmacist prior to using any new prescription or non-prescription medications, including herbals, vitamins, non-steroidal anti-inflammatory drugs (NSAIDs) and supplements.  This  website has more information on Xarelto: [email or web address]

## 2017-10-04 NOTE — Care Management (Signed)
Patient ambulating with nurse in hall and desires to go home.  Discharge was pended because he became dizzy while ambulating.

## 2017-10-04 NOTE — Discharge Summary (Signed)
[facility] NAME: Scott [name]    MR#:  [id number]  DATE OF BIRTH:  11/26/1940  DATE OF ADMISSION:  10/02/2017 ADMITTING PHYSICIAN: Saundra Shelling, MD  DATE OF DISCHARGE: 10/04/17  PRIMARY CARE PHYSICIAN: Baxter Hire, MD    ADMISSION DIAGNOSIS:  Syncope, unspecified syncope type [R55] Chest pain, unspecified type [R07.9]  DISCHARGE DIAGNOSIS:  Active Problems:   Chest pain syncope  SECONDARY DIAGNOSIS:   Past Medical History:  Diagnosis Date  . Arthritis    right hip, since a fall  . CAD (coronary artery disease)    a. 04/2012 Cath: 3VD->Med Rx;  b. 04/2013 PCI RCA (2 DES); c. 03/2014 PCI: LAD 80 (3.0x23 Xience [place] DES); d. 06/2014 CABG x 2 ([facility]) LIMA->LAD, VG->OM; e. 12/2014 Cath ([facility]): patent grafts->Med Rx; f. 08/2015 Cath: patent grafts; g. 11/2016 Cath: LM min irregs, LAD 20p, 33m, LCX 100p/m, RCA 20ost, 10p/m/d, VG->OM1 nl, LIMA->dLAD nl; f. 07/2017 MV: No isch, EF 51%.  . Cardiomyopathy, ischemic    a. 04/2012 Echo: EF 40-45%;  b. 08/2013 Echo: EF 45-50%, mild glob HK, mod lat/post HK, diast dysfxn, mildly dil LA, mild MR, nl RVSP; c. 01/2015 Echo: EF 40-45%, Gr 1 DD, mildly dil LA; 07/2016 Echo: EF 60-65%, no rwma, mild MR, mildly dil LA.  . Carotid arterial disease ([place])    a. 05/2013 Carotid U/S; bilat 40-50% ICA stenosis.  . Chronic Chest Pain   . Chronic systolic CHF (congestive heart failure) ([place])    a. 01/2015 Echo: EF 40-45%; b. 07/2016 Echo: EF 60-65%.  . Cough   . Diverticulosis   . Dyspnea    pt. unsure of exertion cause- that creates SOB at times   . Headache 1970's   migraine  . History of blood transfusion 2016   post op  . Hyperlipidemia   . Hypertension   . Iron deficiency anemia due to chronic blood loss 09/11/2016  . Myocardial infarction [facility])    unsure of when  . Pulmonary emboli ([place]) 07/2016   a. On Xarelto.  . Reflux esophagitis   . Sternal pain    a. 03/2016 s/p Sternal wire  removal; b. 05/2016 s/p redo median sternotomy for sternal debridement and sternal plating.  . Tubular adenoma of colon     HOSPITAL COURSE:   HISTORY OF PRESENT ILLNESS: Scott [name]  is a 77 y.o. male with a known history of coronary artery disease, cardiac stent placement, ischemic cardiomyopathy, carotid artery disease, chronic systolic heart failure follows up with [facility] cardiology presented to the medicine for chest pain.  Patient this morning loaded the truck with frescue grass.  Later on he felt retrosternal chest discomfort.  He went to the sun room in his home then he passed out for a few seconds to a minute.  He took some nitroglycerin and his chest pain got relieved.  Patient has been compliant with his medications.  Had cardiac stress test in July 2019.  And he had last cardiac cath in November 2018.  Hospitalist service was consulted for further care.   -Chest pain with history of coronary artery disease Acute MI ruled out with negative troponins  patient on aspirin, statin medication PRN morphine and nitrates for chest pain Patient had recent cardiac stress testinginJuly 2019 Echo with preserved left ventricular systolic function Further work-up as per cardiology-[facility]  -Syncope secondary to orthostatic hypotension  from Imdur ,Imdur dose reduced to  15 mg  Once  daily  IV fluids provided  -Chronic systolic heart failure Medical management continue Not fluid overloaded  -History of pulmonary embolism continue Xarelto  -Hyperlipidemia Continue statin medication  -DVT prophylaxis On anticoagulation with Xarelto  DISCHARGE CONDITIONS:     CONSULTS OBTAINED:  Treatment Team:  Nelva Bush, MD   PROCEDURES none  DRUG ALLERGIES:   Allergies  Allergen Reactions  . Contrast Media [Iodinated Diagnostic Agents] Hives  . Metrizamide   . Oxycodone Hcl Itching  . Vicodin [Hydrocodone-Acetaminophen] Itching    DISCHARGE MEDICATIONS:   Allergies  as of 10/04/2017      Reactions   Contrast Media [iodinated Diagnostic Agents] Hives   Metrizamide    Oxycodone Hcl Itching   Vicodin [hydrocodone-acetaminophen] Itching      Medication List    TAKE these medications   aspirin 81 MG EC tablet Take 1 tablet (81 mg total) by mouth daily. Start taking on:  10/05/2017   atorvastatin 80 MG tablet Commonly known as:  LIPITOR Take 1 tablet (80 mg total) by mouth daily at 6 PM. What changed:  See the new instructions.   isosorbide mononitrate 30 MG 24 hr tablet Commonly known as:  IMDUR Take 0.5 tablets (15 mg total) by mouth daily. What changed:  how much to take   nitroGLYCERIN 0.4 MG SL tablet Commonly known as:  NITROSTAT Place 1 tablet (0.4 mg total) under the tongue every 5 (five) minutes as needed for chest pain.   ranolazine 1000 MG SR tablet Commonly known as:  RANEXA Take 1 tablet (1,000 mg total) by mouth 2 (two) times daily.   rivaroxaban 20 MG Tabs tablet Commonly known as:  XARELTO Take 1 tablet (20 mg total) by mouth daily.        DISCHARGE INSTRUCTIONS:   Follow up with Primary care physician in 3 to 4 days Follow-up with cardiology Dr. Rockey Situ in 1 week  DIET:  Cardiac diet  DISCHARGE CONDITION:  Fair  ACTIVITY:  Activity as tolerated  OXYGEN:  Home Oxygen: No.   Oxygen Delivery: room air  DISCHARGE LOCATION:  home   If you experience worsening of your admission symptoms, develop shortness of breath, life threatening emergency, suicidal or homicidal thoughts you must seek medical attention immediately by calling 911 or calling your MD immediately  if symptoms less severe.  You Must read complete instructions/literature along with all the possible adverse reactions/side effects for all the Medicines you take and that have been prescribed to you. Take any new Medicines after you have completely understood and accpet all the possible adverse reactions/side effects.   Please note  You were cared  for by a hospitalist during your hospital stay. If you have any questions about your discharge medications or the care you received while you were in the hospital after you are discharged, you can call the unit and asked to speak with the hospitalist on call if the hospitalist that took care of you is not available. Once you are discharged, your primary care physician will handle any further medical issues. Please note that NO REFILLS for any discharge medications will be authorized once you are discharged, as it is imperative that you return to your primary care physician (or establish a relationship with a primary care physician if you do not have one) for your aftercare needs so that they can reassess your need for medications and monitor your lab values.     Today  Chief Complaint  Patient presents with  . Chest Pain  Patient is feeling much better.  Denies any complaints.  Ambulated in the hallway Wants to go home.  Okay to discharge patient from cardiology standpoint ROS:  CONSTITUTIONAL: Denies fevers, chills. Denies any fatigue, weakness.  EYES: Denies blurry vision, double vision, eye pain. EARS, NOSE, THROAT: Denies tinnitus, ear pain, hearing loss. RESPIRATORY: Denies cough, wheeze, shortness of breath.  CARDIOVASCULAR: Denies chest pain, palpitations, edema.  GASTROINTESTINAL: Denies nausea, vomiting, diarrhea, abdominal pain. Denies bright red blood per rectum. GENITOURINARY: Denies dysuria, hematuria. ENDOCRINE: Denies nocturia or thyroid problems. HEMATOLOGIC AND LYMPHATIC: Denies easy bruising or bleeding. SKIN: Denies rash or lesion. MUSCULOSKELETAL: Denies pain in neck, back, shoulder, knees, hips or arthritic symptoms.  NEUROLOGIC: Denies paralysis, paresthesias.  PSYCHIATRIC: Denies anxiety or depressive symptoms.   VITAL SIGNS:  Blood pressure (!) 100/55, pulse (!) 59, temperature 98.3 F (36.8 C), temperature source Oral, resp. rate 18, height 5\' 10"  (1.778 m),  weight 81.6 kg, SpO2 98 %.  I/O:    Intake/Output Summary (Last 24 hours) at 10/04/2017 1515 Last data filed at 10/04/2017 0558 Gross per 24 hour  Intake 240 ml  Output 800 ml  Net -560 ml    PHYSICAL EXAMINATION:  GENERAL:  77 y.o.-year-old patient lying in the bed with no acute distress.  EYES: Pupils equal, round, reactive to light and accommodation. No scleral icterus. Extraocular muscles intact.  HEENT: Head atraumatic, normocephalic. Oropharynx and nasopharynx clear.  NECK:  Supple, no jugular venous distention. No thyroid enlargement, no tenderness.  LUNGS: Normal breath sounds bilaterally, no wheezing, rales,rhonchi or crepitation. No use of accessory muscles of respiration.  CARDIOVASCULAR: S1, S2 normal. No murmurs, rubs, or gallops.  ABDOMEN: Soft, non-tender, non-distended. Bowel sounds present. No organomegaly or mass.  EXTREMITIES: No pedal edema, cyanosis, or clubbing.  NEUROLOGIC: Cranial nerves II through XII are intact. Muscle strength 5/5 in all extremities. Sensation intact. Gait not checked.  PSYCHIATRIC: The patient is alert and oriented x 3.  SKIN: No obvious rash, lesion, or ulcer.   DATA REVIEW:   CBC Recent Labs  Lab 10/02/17 1310  WBC 4.9  HGB 14.8  HCT 43.0  PLT 143*    Chemistries  Recent Labs  Lab 10/02/17 1310 10/03/17 0352  NA 135 137  K 4.2 4.3  CL 103 105  CO2 27 27  GLUCOSE 101* 100*  BUN 18 16  CREATININE 1.29* 1.17  CALCIUM 7.9* 8.1*  AST 24  --   ALT 23  --   ALKPHOS 44  --   BILITOT 0.9  --     Cardiac Enzymes Recent Labs  Lab 10/03/17 0352  TROPONINI <0.03    Microbiology Results  Results for orders placed or performed during the hospital encounter of 11/07/16  MRSA PCR Screening     Status: None   Collection Time: 11/07/16  4:17 PM  Result Value Ref Range Status   MRSA by PCR NEGATIVE NEGATIVE Final    Comment:        The GeneXpert MRSA Assay (FDA approved for NASAL specimens only), is one component of  a comprehensive MRSA colonization surveillance program. It is not intended to diagnose MRSA infection nor to guide or monitor treatment for MRSA infections.     RADIOLOGY:  Dg Chest Portable 1 View  Result Date: 10/02/2017 CLINICAL DATA:  77 year old male with chest pain. Initial encounter. EXAM: PORTABLE CHEST 1 VIEW COMPARISON:  08/02/2017 chest x-ray. FINDINGS: Post CABG. Heart size within normal limits. Calcified minimally tortuous aorta. No infiltrate, congestive heart  failure or pneumothorax. Acromioclavicular joint degenerative changes. No acute osseous abnormality. IMPRESSION: 1. Post CABG. 2. No infiltrate or congestive heart failure. 3.  Aortic Atherosclerosis (ICD10-I70.0). Electronically Signed   By: Genia Del M.D.   On: 10/02/2017 13:50   Dg Hand Complete Left  Result Date: 10/02/2017 CLINICAL DATA:  Fall with pain and swelling bruised and swollen EXAM: LEFT HAND - COMPLETE 3+ VIEW COMPARISON:  None. FINDINGS: No fracture or malalignment. No radiopaque foreign body. Prominent cartilaginous calcification at the wrist. IMPRESSION: 1. No acute osseous abnormality. 2. Chondrocalcinosis Electronically Signed   By: Donavan Foil M.D.   On: 10/02/2017 14:50    EKG:   Orders placed or performed during the hospital encounter of 10/02/17  . EKG 12-Lead  . EKG 12-Lead      Management plans discussed with the patient, family and they are in agreement.  CODE STATUS:     Code Status Orders  (From admission, onward)         Start     Ordered   10/02/17 1604  Full code  Continuous     10/02/17 1604        Code Status History    Date Active Date Inactive Code Status Order ID Comments User Context   12/04/2016 1146 12/04/2016 1920 Full Code [id number]  Wellington Hampshire, MD Inpatient   11/19/2016 1715 11/21/2016 1731 Full Code [id number]  Idelle Crouch, MD Inpatient   11/07/2016 1614 11/08/2016 1957 Full Code [id number]  Vaughan Basta, MD Inpatient    08/04/2016 1535 08/09/2016 1723 Full Code [id number]  Epifanio Lesches, MD ED   05/25/2016 1422 05/26/2016 1712 Full Code [id number]  Nani Skillern, PA-C Inpatient   02/08/2015 1307 02/09/2015 1843 Full Code [id number]  Epifanio Lesches, MD ED   12/07/2014 1423 12/08/2014 1852 Full Code [id number]  Fritzi Mandes, MD Inpatient    Advance Directive Documentation     Most Recent Value  Type of Advance Directive  Healthcare Power of Attorney, Living will  Pre-existing out of facility DNR order (yellow form or pink MOST form)  -  "MOST" Form in Place?  -      TOTAL TIME TAKING CARE OF THIS PATIENT: 43 minutes.   Note: This dictation was prepared with Dragon dictation along with smaller phrase technology. Any transcriptional errors that result from this process are unintentional.   @[facility] @  on 10/04/2017 at 3:15 PM  Between 7am to 6pm - Pager - [phone]  After 6pm go to [email or web address] - password [email or web address] [place] [facility]  Office  [phone]  CC: Primary care physician; Baxter Hire, MD

## 2017-10-04 NOTE — Progress Notes (Addendum)
Progress Note  Patient Name: Scott [name] Date of Encounter: 10/04/2017  Primary Cardiologist: [name], MD  Subjective   No further orthostatic lightheadedness.  Has ambulated to the bathroom and around his room several times w/o difficulty.  He has chronic, low level retrosternal chest discomfort, which hasn't changed and is not activity limiting.  He is very eager to go home.  He did note some blood when he blew his nose this AM and then noted blood streaked sputum when coughing.  Inpatient Medications    Scheduled Meds: . aspirin EC  81 mg Oral Daily  . atorvastatin  80 mg Oral q1800  . ranolazine  1,000 mg Oral BID  . rivaroxaban  20 mg Oral Daily  . sodium chloride flush  3 mL Intravenous Q12H   Continuous Infusions: . sodium chloride     PRN Meds: sodium chloride, acetaminophen, ondansetron (ZOFRAN) IV, sodium chloride flush   Vital Signs    Vitals:   10/04/17 0800 10/04/17 0918 10/04/17 0922 10/04/17 0925  BP: (!) 103/55 115/67 98/67 113/63  Pulse: 65     Resp: 18     Temp: 98.3 F (36.8 C)     TempSrc: Oral     SpO2: 93%     Weight:      Height:        Intake/Output Summary (Last 24 hours) at 10/04/2017 1043 Last data filed at 10/04/2017 0558 Gross per 24 hour  Intake 480 ml  Output 1000 ml  Net -520 ml   Filed Weights   10/02/17 1303  Weight: 81.6 kg    Physical Exam   GEN: Well nourished, well developed, in no acute distress.  HEENT: Grossly normal.  Neck: Supple, no JVD, carotid bruits, or masses. Cardiac: RRR, no murmurs, rubs, or gallops. No clubbing, cyanosis, edema.  Radials/DP/PT 2+ and equal bilaterally.  Respiratory:  Respirations regular and unlabored, clear to auscultation bilaterally. GI: Soft, nontender, nondistended, BS + x 4. MS: no deformity or atrophy. Skin: warm and dry, no rash. Neuro:  Strength and sensation are intact. Psych: AAOx3.  Normal affect.  Labs    Chemistry Recent Labs  Lab 10/02/17 1310  10/03/17 0352  NA 135 137  K 4.2 4.3  CL 103 105  CO2 27 27  GLUCOSE 101* 100*  BUN 18 16  CREATININE 1.29* 1.17  CALCIUM 7.9* 8.1*  PROT 6.1*  --   ALBUMIN 3.2*  --   AST 24  --   ALT 23  --   ALKPHOS 44  --   BILITOT 0.9  --   GFRNONAA 53* 59*  GFRAA >60 >60  ANIONGAP 5 5     Hematology Recent Labs  Lab 10/02/17 1310  WBC 4.9  RBC 4.59  HGB 14.8  HCT 43.0  MCV 93.8  MCH 32.3  MCHC 34.5  RDW 14.5  PLT 143*    Cardiac Enzymes Recent Labs  Lab 10/02/17 1310 10/02/17 1617 10/02/17 2258 10/03/17 0352  TROPONINI <0.03 <0.03 <0.03 <0.03      Radiology    Dg Chest Portable 1 View  Result Date: 10/02/2017 CLINICAL DATA:  77 year old male with chest pain. Initial encounter. EXAM: PORTABLE CHEST 1 VIEW COMPARISON:  08/02/2017 chest x-ray. FINDINGS: Post CABG. Heart size within normal limits. Calcified minimally tortuous aorta. No infiltrate, congestive heart failure or pneumothorax. Acromioclavicular joint degenerative changes. No acute osseous abnormality. IMPRESSION: 1. Post CABG. 2. No infiltrate or congestive heart failure. 3.  Aortic Atherosclerosis (ICD10-I70.0). Electronically  Signed   By: Genia Del M.D.   On: 10/02/2017 13:50   Dg Hand Complete Left  Result Date: 10/02/2017 CLINICAL DATA:  Fall with pain and swelling bruised and swollen EXAM: LEFT HAND - COMPLETE 3+ VIEW COMPARISON:  None. FINDINGS: No fracture or malalignment. No radiopaque foreign body. Prominent cartilaginous calcification at the wrist. IMPRESSION: 1. No acute osseous abnormality. 2. Chondrocalcinosis Electronically Signed   By: Donavan Foil M.D.   On: 10/02/2017 14:50    Telemetry    RSR - Personally Reviewed  Cardiac Studies   2-D echo 10/02/2017: Study Conclusions  - Left ventricle: The cavity size was normal. Systolic function was normal. The estimated ejection fraction was in the range of 55% to 60%. Wall motion was normal; there were no regional wall motion  abnormalities. Unable to exclude basal inferior wall hypokineiss. Not well visualized. Doppler parameters are consistent with abnormal left ventricular relaxation (grade 1 diastolic dysfunction). - Left atrium: The atrium was mildly dilated. - Right ventricle: Systolic function was normal. - Pulmonary arteries: Systolic pressure was within the normal range.  Patient Profile     77 y.o. male with history of CAD s/p 2-vessel CABG with a LIMA to LAD and SVG to IM in 06/2014 with post-operative Afib with chronic chest pain not relieved by bypass surgery, post operative course was complicated by need to redo median sternotomy with sternal plating in 05/2016, chronic chest pain with several cardiac catheterizations since his CABG revealing stable coronary anatomy, ICM, PE in 07/2016 s/p Xarelto with workup through hematology, prior tobacco abuse,andanxietywho is being seen today for the evaluation of chronic anginaand orthostatic hypotension.   Assessment & Plan    1.  Orthostatic hypotension/syncope:  Feeling better this AM.  Orthostatic performed but not in computer yet.  BP rose with standing and he has been asymptomatic.  Encouraged PO fluids @ home.  Will resume imdur @ 30 daily (prev on 60).  2.  CAD with chronic chest pain:  He continues to report mild, low level chest discomfort.  This is an ongoing issue with nonischemic MV in July. Trops nl this admission.  Cont med rx including asa, statin, ranexa.  Will resume imdur @ 30.  3.  H/o PE:  Remains on xarelto.  4.  HL:  LDL 68.  Cont statin rx. LFTs ok.  5.  Sinus congestion:  He has been congested and noted some blood streaking in nasal d/c and sputum this AM.  I suspect his nares are dry r/t hospitalization and blood in sputum may represent nosebleed w/ post-nasal drip.  No active bleeding @ this time.  Rec nasal saline.  Signed, Murray Hodgkins, NP  10/04/2017, 10:43 AM    For questions or updates, please contact   Please  consult [email or web address] for contact info under Cardiology/STEMI.   Attending Note Patient seen and examined, agree with detailed note above,  Patient presentation and plan discussed on rounds.   Walking earlier today, very dizzy walking around the unit Mildly orthostatic He did receive his isosorbide 30 mg x 1 today Otherwise reports that he feels well, no chest pain or shortness of breath on exertion  On physical examination no JVD lungs clear to auscultation bilaterally heart sounds regular normal S1-S2 no murmurs appreciated abdomen soft nontender no significant lower extremity edema, alert, communicative  Lab work reviewed creatinine 1.17 BUN 16 testing 4.3 sodium 137 troponin negative x3 total cholesterol 128  A/p: Syncope  secondary to orthostasis, exacerbated by mild dehydration  and isosorbide No significant arrhythmia on telemetry Normal echocardiogram -We will recommend we decrease isosorbide down to 15 mg daily Aggressive oral fluid hydration Mildly orthostatic on the floor, was improved after oral hydration through the day  Chronic chest pain, angina No further ischemic work-up at this time Cardiac enzymes negative and normal ejection fraction He has had numerous cardiac catheterizations, stress test  History of pulmonary embolism continue anticoagulation xarelto  Anxiety Prior history of panic attacks PTSD concerning his cardiac issues requiring frequent work-up for atypical symptoms  Long discussion with patient and patient's wife at the bedside Discharge planning discussed Greater than 50% was spent in counseling and coordination of care with patient Total encounter time 35 minutes or more   Signed: Esmond Plants  M.D., Ph.D. [facility]

## 2017-10-05 NOTE — Care Management (Signed)
Late entry- Patient walked again later in the evening and felt fine.  No dizziness.  Patient wants to be discharged.  MD ordered.  Wife transported patient home.

## 2017-10-09 ENCOUNTER — Telehealth: Payer: Self-pay | Admitting: *Deleted

## 2017-10-09 NOTE — Telephone Encounter (Signed)
Patient's wife answered, ok per DPR. Patient was out in the yard.  Patient's wife contacted regarding discharge from [facility] on 10/04/17.   Patient's wife understands to follow up with provider ? On 10/11/17 at 2pm at [place].  Patient's wife understands discharge instructions? Yes Patient's wife understands medications and regiment? Yes Patient's wife understands to bring all medications to this visit? Yes

## 2017-10-09 NOTE — Telephone Encounter (Signed)
-----   Message from Blain Pais sent at 10/08/2017 11:43 AM EDT ----- Regarding: tcm/ph 9/19 2:00 pm Murray Hodgkins, NP

## 2017-10-11 ENCOUNTER — Encounter: Payer: Self-pay | Admitting: Nurse Practitioner

## 2017-10-11 ENCOUNTER — Ambulatory Visit: Payer: Medicare HMO | Admitting: Nurse Practitioner

## 2017-10-11 VITALS — BP 132/70 | HR 58 | Ht 70.0 in | Wt 175.6 lb

## 2017-10-11 DIAGNOSIS — R55 Syncope and collapse: Secondary | ICD-10-CM | POA: Diagnosis not present

## 2017-10-11 DIAGNOSIS — I951 Orthostatic hypotension: Secondary | ICD-10-CM | POA: Diagnosis not present

## 2017-10-11 DIAGNOSIS — I2510 Atherosclerotic heart disease of native coronary artery without angina pectoris: Secondary | ICD-10-CM | POA: Diagnosis not present

## 2017-10-11 DIAGNOSIS — E785 Hyperlipidemia, unspecified: Secondary | ICD-10-CM | POA: Diagnosis not present

## 2017-10-11 NOTE — Patient Instructions (Signed)
Medication Instructions:  Your physician has recommended you make the following change in your medication:  1- STOP Isosorbide.   Labwork: none  Testing/Procedures: none  Follow-Up: Your physician recommends that you schedule a follow-up appointment in: [place].   If you need a refill on your cardiac medications before your next appointment, please call your pharmacy.

## 2017-10-11 NOTE — Progress Notes (Signed)
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  Past Medical History    Past Medical History:  Diagnosis Date  . Arthritis    right hip, since a fall  . CAD (coronary artery disease)    a. 04/2012 Cath: 3VD->Med Rx;  b. 04/2013 PCI RCA (2 DES); c. 03/2014 PCI: LAD 80 (3.0x23 Xience Alpine DES); d. 06/2014 CABG x 2 ([facility]) LIMA->LAD, VG->OM; e. 12/2014 Cath ([facility]): patent grafts->Med Rx; f. 08/2015 Cath: patent grafts; g. 11/2016 Cath: LM min irregs, LAD 20p, 70m, LCX 100p/m, RCA 20ost, 10p/m/d, VG->OM1 nl, LIMA->dLAD nl; f. 07/2017 MV: No isch, EF 51%.  . Cardiomyopathy, ischemic    a. 04/2012 Echo: EF 40-45%;  b. 08/2013 Echo: EF 45-50%; c. 01/2015 Echo: EF 40-45%; d. 07/2016 Echo: EF 60-65%; e. 09/2017 Echo: EF 55-60%, Gr1 DD.  . Carotid arterial disease ([place])    a. 05/2013 Carotid U/S; bilat 40-50% ICA stenosis.  . Chronic Chest Pain   . Chronic systolic CHF (congestive heart failure) ([place])    a. 01/2015 Echo: EF 40-45%; b. 07/2016 Echo: EF 60-65%; c. 09/2017 Echo: EF 55-60%, Gr1 DD, nl RV fxn.  . Cough   . Diverticulosis   . Headache 1970's   migraine  . History of blood transfusion 2016   post op  . Hyperlipidemia   . Hypertension   . Iron deficiency anemia due to chronic blood loss 09/11/2016  . Myocardial infarction [facility])    unsure of when  . Pulmonary emboli ([place]) 07/2016   a. On  Xarelto.  . Reflux esophagitis   . Sternal pain    a. 03/2016 s/p Sternal wire removal; b. 05/2016 s/p redo median sternotomy for sternal debridement and sternal plating.  . Tubular adenoma of colon    Past Surgical History:  Procedure Laterality Date  . CARDIAC CATHETERIZATION  05/01/2012  . CARDIAC CATHETERIZATION  04/2013   [facility];x3 stent  . CARDIAC CATHETERIZATION  01/11/15    [facility]  . CARDIAC CATHETERIZATION N/A 09/16/2015   Procedure: LEFT HEART CATH AND CORS/GRAFTS ANGIOGRAPHY;  Surgeon: Minna Merritts, MD;  Location: [place] [facility];  Service: Cardiovascular;  Laterality: N/A;  . CARPAL TUNNEL RELEASE     right hand  . CATARACT EXTRACTION     LEFT  . CATARACT EXTRACTION W/PHACO Left 09/16/2014   Procedure: CATARACT EXTRACTION PHACO AND INTRAOCULAR LENS PLACEMENT (IOC);  Surgeon: Leandrew Koyanagi, MD;  Location: [place];  Service: Ophthalmology;  Laterality: Left;  . COLONOSCOPY    . COLONOSCOPY WITH PROPOFOL N/A 01/28/2016   Procedure: COLONOSCOPY WITH PROPOFOL;  Surgeon: Lollie Sails, MD;  Location: [facility] ENDOSCOPY;  Service: Endoscopy;  Laterality: N/A;  . CORONARY ANGIOPLASTY WITH STENT PLACEMENT  04/13/2014  . CORONARY ARTERY BYPASS GRAFT  06-26-14   x3 bypasses  . DE QUERVAIN'S RELEASE Left 08/22/2012  . ESOPHAGOGASTRODUODENOSCOPY (EGD) WITH PROPOFOL N/A 04/26/2015   Procedure: ESOPHAGOGASTRODUODENOSCOPY (EGD) WITH PROPOFOL;  Surgeon:  Hulen Luster, MD;  Location: [facility] ENDOSCOPY;  Service: Gastroenterology;  Laterality: N/A;  . ESOPHAGOGASTRODUODENOSCOPY (EGD) WITH PROPOFOL N/A 05/29/2017   Procedure: ESOPHAGOGASTRODUODENOSCOPY (EGD) WITH PROPOFOL;  Surgeon: Lollie Sails, MD;  Location: [facility] ENDOSCOPY;  Service: Endoscopy;  Laterality: N/A;  . LEFT HEART CATH AND CORONARY ANGIOGRAPHY N/A 12/04/2016   Procedure: LEFT HEART CATH AND CORS/GRAFTS  ANGIOGRAPHY;  Surgeon: Wellington Hampshire, MD;  Location: [place] [facility];  Service: Cardiovascular;  Laterality:  N/A;  . RIB PLATING N/A 05/25/2016   Procedure: STERNAL PLATING;  Surgeon: Melrose Nakayama, MD;  Location: [place];  Service: Thoracic;  Laterality: N/A;  . right shoulder    . STERNAL WIRES REMOVAL N/A 03/30/2016   Procedure: STERNAL WIRES REMOVAL;  Surgeon: Melrose Nakayama, MD;  Location: [place];  Service: Thoracic;  Laterality: N/A;  . TONSILLECTOMY      Allergies  Allergies  Allergen Reactions  . Contrast Media [Iodinated Diagnostic Agents] Hives  . Metrizamide   . Oxycodone Hcl Itching  . Vicodin [Hydrocodone-Acetaminophen] Itching    History of Present Illness    77 year old male with the above complex past medical history.  He is status post PCI of the right coronary artery in April 2015 with subacute PCI of the LAD in March 2016.  He continued to have chest pain following procedures and subsequently underwent CABG x2 at [facility] in June 2016.  Since then, he has had chronic, intermittent chest discomfort and also dyspnea on exertion with subsequent catheterization in August 2017 revealing patent grafts and stable native disease. In the setting of chronic chest pain, he was evaluated in pain clinic and subsequently was seen by CT surgery and underwent sternal wire removal in March 2018, which was followed by redo sternotomy with sternal debridement and plating in the setting of nonunion of the manubrium and ongoing sternal pain.  In July 2018, he presented with chest pain and dyspnea and was found to have a pulmonary embolus and has been on Xarelto ever since.  In October 2018, he underwent stress testing due to ongoing chest pain and this was nonischemic.  Due to ongoing pain, he underwent diagnostic catheterization again in November 2018 which showed 2 of 2 patent grafts with an occluded left circumflex, and otherwise stable anatomy.  He has been managed with isosorbide and Ranexa ever since.  On September 10, he was working in his yard and developed substernal chest discomfort.  He  went and sat down and took a sublingual nitroglycerin and then stood up and lost consciousness.  His wife called EMS and he was found to be hypotensive with a blood pressure of 60/40.  He was taken to the [facility] ED where he was treated with IV fluids.  He was admitted for further evaluation.  Echocardiogram showed stable, normal LV function and troponin was normal.  We held his nitrate initially but when we resumed it at 30 mg in September 12, he had recurrent presyncope.  We therefore sent him home on 15 mg daily.  He says that since discharge, he has noted orthostatic lightheadedness when moving from lying down to sitting or from sitting to standing.  He has not had any recurrent syncope.  He said he worked in the yard all day yesterday and felt really good.  He has not had any chest discomfort but after working in the yard yesterday, he did feel little bit lightheadedness blood pressure was in the 80s.  He denies dyspnea, PND, orthopnea,  edema, or early satiety.  Home Medications    Prior to Admission medications   Medication Sig Start Date End Date Taking? Authorizing Provider  aspirin EC 81 MG EC tablet Take 1 tablet (81 mg total) by mouth daily. 10/05/17  Yes Gouru, Illene Silver, MD  atorvastatin (LIPITOR) 80 MG tablet Take 1 tablet (80 mg total) by mouth daily at 6 PM. 10/04/17  Yes Gouru, Aruna, MD  isosorbide mononitrate (IMDUR) 30 MG 24 hr tablet Take 0.5 tablets (15 mg total) by mouth daily. 10/04/17  Yes Gouru, Illene Silver, MD  nitroGLYCERIN (NITROSTAT) 0.4 MG SL tablet Place 1 tablet (0.4 mg total) under the tongue every 5 (five) minutes as needed for chest pain. 04/26/17  Yes Minna Merritts, MD  ranolazine (RANEXA) 1000 MG SR tablet Take 1 tablet (1,000 mg total) by mouth 2 (two) times daily. 08/14/17  Yes Gollan, [name], MD  rivaroxaban (XARELTO) 20 MG TABS tablet Take 1 tablet (20 mg total) by mouth daily. 08/14/17  Yes Minna Merritts, MD    Review of Systems    Ongoing presyncope.  He denies  chest pain, palpitations, dyspnea, pnd, orthopnea, n, v, edema, weight gain, or early satiety.  All other systems reviewed and are otherwise negative except as noted above.  Physical Exam    VS:  BP 132/70 (BP Location: Left Arm, Patient Position: Sitting, Cuff Size: Normal)   Pulse (!) 58   Ht 5\' 10"  (1.778 m)   Wt 175 lb 10 oz (79.7 kg)   BMI 25.20 kg/m  , BMI Body mass index is 25.2 kg/m.  Orthostatic VS for the past 24 hrs:  BP- Lying Pulse- Lying BP- Sitting Pulse- Sitting BP- Standing at 0 minutes Pulse- Standing at 0 minutes  10/11/17 1358 117/70 56 117/71 56 122/65 58  Blood pressure 109/69 with a heart rate of 59 after standing for 3 minutes.     GEN: Well nourished, well developed, in no acute distress. HEENT: normal. Neck: Supple, no JVD, carotid bruits, or masses. Cardiac: RRR, no murmurs, rubs, or gallops. No clubbing, cyanosis, edema.  Radials/DP/PT 2+ and equal bilaterally.  Respiratory:  Respirations regular and unlabored, clear to auscultation bilaterally. GI: Soft, nontender, nondistended, BS + x 4. MS: no deformity or atrophy. Skin: warm and dry, no rash. Neuro:  Strength and sensation are intact. Psych: Normal affect.  Accessory Clinical Findings    ECG personally reviewed by me today -sinus bradycardia, 58, nonspecific IVCD- no acute changes.  Assessment & Plan    1.  Presyncope/syncope/orthostatic hypotension: Patient recently hospitalized after experiencing syncope following sublingual nitroglycerin.  Review of notes also indicated that he was given additional isosorbide mononitrate that day.  He had some presyncope during hospitalization, including after isosorbide mononitrate 30 mg was resumed.  As result, we dropped his dose to 15 mg daily and encouraged adequate hydration.  Since his discharge, he has continued to have intermittent presyncope, occurring exclusively with changing positions and standing up.  On exam today, blood pressure did drop to 109/69  after standing for 3 minutes.  We discussed options for management.  At this point, in the absence of chest pain, I have asked him to discontinue isosorbide for the time being.  I also recommended that he may liberalize salt slightly and consider wearing compression socks.  If he continues to have symptoms despite these measures, we would have to consider initiation of Florinef.  Patient and wife were agreeable with this plan.  2.  Coronary artery disease:  Status post multiple interventions including CABG.  He has chronic chest pain though since discharge, he has not had any chest pain despite remaining active.  He remains on low-dose aspirin, statin, and Ranexa.  As above, discontinuing isosorbide in the setting of ongoing orthostasis and presyncope.  I advised that if he has chest pain requires sublingual nitro glycerin, he is to lie down before taking it.  3.  Hyperlipidemia: He remains on high potency statin therapy.  LDL was 68 earlier this month.  4.  Disposition: Follow-up in 3 months or sooner if necessary.  Murray Hodgkins, NP 10/11/2017, 2:57 PM

## 2017-10-19 DIAGNOSIS — I10 Essential (primary) hypertension: Secondary | ICD-10-CM | POA: Diagnosis not present

## 2017-10-26 ENCOUNTER — Telehealth: Payer: Self-pay | Admitting: Cardiovascular Disease

## 2017-10-26 DIAGNOSIS — D6859 Other primary thrombophilia: Secondary | ICD-10-CM | POA: Diagnosis not present

## 2017-10-26 DIAGNOSIS — I5032 Chronic diastolic (congestive) heart failure: Secondary | ICD-10-CM | POA: Diagnosis not present

## 2017-10-26 DIAGNOSIS — I480 Paroxysmal atrial fibrillation: Secondary | ICD-10-CM | POA: Diagnosis not present

## 2017-10-26 DIAGNOSIS — I10 Essential (primary) hypertension: Secondary | ICD-10-CM | POA: Diagnosis not present

## 2017-10-26 DIAGNOSIS — R42 Dizziness and giddiness: Secondary | ICD-10-CM | POA: Diagnosis not present

## 2017-10-26 DIAGNOSIS — I2510 Atherosclerotic heart disease of native coronary artery without angina pectoris: Secondary | ICD-10-CM | POA: Diagnosis not present

## 2017-10-26 DIAGNOSIS — R55 Syncope and collapse: Secondary | ICD-10-CM

## 2017-10-26 NOTE — Telephone Encounter (Signed)
I attempted to call the patient. I left a message for him to call back.

## 2017-10-26 NOTE — Telephone Encounter (Signed)
If he'd like, we can place a 14 day zio monitor to assess for possible arrhythmia.

## 2017-10-26 NOTE — Telephone Encounter (Signed)
I called and spoke with the patient. He is aware of Ignacia Bayley, NP's recommendations for a 14 day ZIO monitor.  Per Gerald Stabs, NP- due to pre-syncope- ok to order the ZIO- AT real time monitor.   The patient is aware I will sent this for pre-cert and it may be Monday before we can get him registered for this. I have advised him that he should receive the monitor by Wednesday next week. He is also aware that a rep from [employer] will call him once the monitor is received.  This may come from an unidentified #, so please pick up these calls.

## 2017-10-26 NOTE — Telephone Encounter (Signed)
Pt c/o of Chest Pain: STAT if CP now or developed within 24 hours  1. Are you having CP right now? Right now it is sore   2. Are you experiencing any other symptoms (ex. SOB, nausea, vomiting, sweating)? SOB, sweating, has been dizzy and almost felt like passing out yesterday afternoon  3. How long have you been experiencing CP? 1 week or so  4. Is your CP continuous or coming and going? Sore on the sides and it does seem continuous   5. Have you taken Nitroglycerin? no ? Patient will be volunteering today til 4, please call cell

## 2017-10-26 NOTE — Telephone Encounter (Signed)
I spoke with the patient. He states that he had an episode of chest pain yesterday afternoon that was different from anything he has had before. This was associated with feelings of pre-syncope and diaphoresis. He states he had just walked in his sun room when this happened. He states that his BP/ HR were checked at the time and were both "ok."  He has been volunteering today and also washed his truck this morning. He states he just feels "sore" in the middle of his chest today. He has been SOB for about a week as well. I inquired if he is unable to take a good breath in due to chest soreness, but he states he is unsure.  He did have a syncopal episode 2 weeks ago- per Ignacia Bayley, NP's office note on 10/11/17, this was after sublingual NTG use.   Per the patient, he saw his PCP today and explained the episode to him. The patient states that the PCP "didn't seem too concerned." PCP did decrease Ranexa to 500 mg BID as of today. The patient is currently off of imdur. All other medications confirmed with the patient.   I advised the patient I would review with Ignacia Bayley, NP. Per the patient, he states he already spoke with Gerald Stabs earlier today as he saw him in the hospital. He states that Gerald Stabs was to speak with Dr. Rockey Situ and call back with a plan.  The patient is aware I will speak with Gerald Stabs, NP and call him back. He is agreeable.

## 2017-10-29 NOTE — Telephone Encounter (Signed)
Per Caryl Pina, the patient does not require precert for his monitor. Monitor request placed in [employer]. Pending home enrollment.

## 2017-10-29 NOTE — Telephone Encounter (Signed)
Staff message sent to Dorian Pod for precert for ZIO AT monitor.

## 2017-11-01 ENCOUNTER — Inpatient Hospital Stay: Payer: Medicare HMO | Attending: Oncology

## 2017-11-01 DIAGNOSIS — Z7901 Long term (current) use of anticoagulants: Secondary | ICD-10-CM | POA: Insufficient documentation

## 2017-11-01 DIAGNOSIS — D509 Iron deficiency anemia, unspecified: Secondary | ICD-10-CM | POA: Diagnosis not present

## 2017-11-01 DIAGNOSIS — D500 Iron deficiency anemia secondary to blood loss (chronic): Secondary | ICD-10-CM

## 2017-11-01 DIAGNOSIS — I480 Paroxysmal atrial fibrillation: Secondary | ICD-10-CM | POA: Diagnosis not present

## 2017-11-01 DIAGNOSIS — R042 Hemoptysis: Secondary | ICD-10-CM | POA: Diagnosis not present

## 2017-11-01 LAB — CBC WITH DIFFERENTIAL/PLATELET
Abs Immature Granulocytes: 0.02 10*3/uL (ref 0.00–0.07)
BASOS ABS: 0 10*3/uL (ref 0.0–0.1)
Basophils Relative: 0 %
EOS PCT: 1 %
Eosinophils Absolute: 0.1 10*3/uL (ref 0.0–0.5)
HCT: 44.2 % (ref 39.0–52.0)
HEMOGLOBIN: 14.5 g/dL (ref 13.0–17.0)
IMMATURE GRANULOCYTES: 0 %
LYMPHS ABS: 1.4 10*3/uL (ref 0.7–4.0)
LYMPHS PCT: 28 %
MCH: 31.4 pg (ref 26.0–34.0)
MCHC: 32.8 g/dL (ref 30.0–36.0)
MCV: 95.7 fL (ref 80.0–100.0)
Monocytes Absolute: 0.5 10*3/uL (ref 0.1–1.0)
Monocytes Relative: 10 %
NRBC: 0 % (ref 0.0–0.2)
Neutro Abs: 3 10*3/uL (ref 1.7–7.7)
Neutrophils Relative %: 61 %
Platelets: 184 10*3/uL (ref 150–400)
RBC: 4.62 MIL/uL (ref 4.22–5.81)
RDW: 14 % (ref 11.5–15.5)
WBC: 5 10*3/uL (ref 4.0–10.5)

## 2017-11-01 LAB — IRON AND TIBC
Iron: 85 ug/dL (ref 45–182)
SATURATION RATIOS: 33 % (ref 17.9–39.5)
TIBC: 260 ug/dL (ref 250–450)
UIBC: 175 ug/dL

## 2017-11-01 LAB — FERRITIN: Ferritin: 131 ng/mL (ref 24–336)

## 2017-11-02 ENCOUNTER — Other Ambulatory Visit: Payer: Self-pay

## 2017-11-02 ENCOUNTER — Encounter: Payer: Self-pay | Admitting: Oncology

## 2017-11-02 ENCOUNTER — Inpatient Hospital Stay: Payer: Medicare HMO | Admitting: Oncology

## 2017-11-02 ENCOUNTER — Inpatient Hospital Stay (HOSPITAL_BASED_OUTPATIENT_CLINIC_OR_DEPARTMENT_OTHER): Payer: Medicare HMO | Admitting: Oncology

## 2017-11-02 VITALS — BP 122/73 | HR 61 | Temp 96.3°F | Resp 18 | Wt 175.0 lb

## 2017-11-02 DIAGNOSIS — I480 Paroxysmal atrial fibrillation: Secondary | ICD-10-CM | POA: Diagnosis not present

## 2017-11-02 DIAGNOSIS — D500 Iron deficiency anemia secondary to blood loss (chronic): Secondary | ICD-10-CM

## 2017-11-02 DIAGNOSIS — R042 Hemoptysis: Secondary | ICD-10-CM

## 2017-11-02 DIAGNOSIS — Z7901 Long term (current) use of anticoagulants: Secondary | ICD-10-CM | POA: Diagnosis not present

## 2017-11-02 DIAGNOSIS — D509 Iron deficiency anemia, unspecified: Secondary | ICD-10-CM | POA: Diagnosis not present

## 2017-11-02 NOTE — Progress Notes (Signed)
[facility]  Follow Up Visit:  Patient Care Team: Baxter Hire, MD as PCP - General (Internal Medicine) Rockey Situ [name], MD as PCP - Cardiology (Cardiology) Madelyn Brunner, MD (Internal Medicine) Alisa Graff, FNP as Nurse Practitioner (Cardiology) Minna Merritts, MD as Consulting Physician (Cardiology)  REASON FOR VISIT Follow up for treatment of iron deficiency anemia  HISTORY OF PRESENTING ILLNESS: Scott [name] 77 y.o. male with PMH listed as below who is referred to me for evaluation and management of pulmonary embolism.  Scott [name] has a history paroxymal atrial fibrillation on Eliquis 60m BID until recently, [facility], s/p removal of sternal wire on 03/2016,  s/p sternal debridement and plating on 05/25/2016 in the setting of CT chest performed by pain management showing sternal nonunion of the manubrium with subsequent improvement in his pain. Perioperatively his anticoagulation with Eliquis was discontinue a few days prior surgery and resumed after surgery. Operation note mentioned that during the procedure, the left lung was densely adherent to the underside of the sternum and tear in the lung did occur and was sutured. [facility] of SOB and was found to have a PE. Patient states that he was compliant with Eliquis.  CTA chest showed left upper lobe PE extending into the left main pulmonary artery. His Eliquis was changed to Xarelto. Lower extremity dopplers were negative for DVT. Hypercoagulable workup was sent and all tests were negative except a mild low protein S function.  No formal cancer workup.  Sed rate normal, heavy metals negative, TSH mildly reduced at 0.253. Patient is referred to see [place] of his protein S deficiency and management of his anticoagulation.     Hypercoagulable work up: discussed with patient. He is tested negative  for factor 5 leiden mutation, prothrombin mutation, anti phospholipin antibody panels. Normal protein C activity. Normal total protein S, mildly low protein S function, can be falsely low in the acute setting of thrombosis.Congential protein S deficiency is unlikely given that he has no prior history of VTE. Repeat protein S total and free were normal.   # He has had one dose of Feraheme and during the treatment he had a chest tightness and end of infusion. Has received IV iron Venofer and tolerates well.   INTERVAL HISTORY Patient presents for follow-up of management of chronic anticoagulation, iron deficiency anemia.  Accompanied by wife. Patient reports feeling at baseline today.  Continues to have chronic intermittent chest pain and shortness of breath that has been previously extensively worked up.  He follows up with Dr. GNyoka Cowdenfor his heart care.  Extensive cardiology disease CAD status post prior RCA and LAD stenting as well as CABG x2 chronic chest pain status post sternal wire removal with sternal debridement and plating, ischemia cardiomyopathy, chronic dyspnea. During the interval patient also had a hospitalization due to syncope due to orthostatic hypotension.  His cardiology medications were adjusted. He takes Xarelto for history of pulmonary embolism.  Reports easy bruising Also reports that occasionally he coughs out tiny amount of blood.  He had a chest x-ray done on 10/02/2017 which showed no infiltrate or congestive heart failure.  CT angios chest PE protocol done on 11/07/2016 showed no lung nodules or mass. .  Review of Systems  Constitutional: Positive for fatigue. Negative for chills, diaphoresis, fever and unexpected weight change.  HENT:   Negative for hearing loss, lump/mass, mouth sores, nosebleeds and sore throat.   Eyes: Negative for eye problems and icterus.  Respiratory: Positive for shortness of breath. Negative for chest tightness, cough, hemoptysis and wheezing.         SOB with exertion.   Cardiovascular: Negative for chest pain and leg swelling.       Chronic Intermittent chest pain.  Gastrointestinal: Negative for abdominal distention, abdominal pain, blood in stool, diarrhea, nausea and rectal pain.  Endocrine: Negative for hot flashes.  Genitourinary: Negative for bladder incontinence, difficulty urinating, dysuria, frequency, hematuria and nocturia.   Musculoskeletal: Negative for arthralgias, back pain, flank pain, gait problem and myalgias.  Skin: Negative for itching and rash.  Neurological: Negative for dizziness, gait problem, headaches, numbness and seizures.  Hematological: Negative for adenopathy. Does not bruise/bleed easily.  Psychiatric/Behavioral: Negative for confusion and decreased concentration. The patient is not nervous/anxious.     MEDICAL HISTORY: Past Medical History:  Diagnosis Date  . Arthritis    right hip, since a fall  . CAD (coronary artery disease)    a. 04/2012 Cath: 3VD->Med Rx;  b. 04/2013 PCI RCA (2 DES); c. 03/2014 PCI: LAD 80 (3.0x23 Xience Alpine DES); d. 06/2014 CABG x 2 ([facility]) LIMA->LAD, VG->OM; e. 12/2014 Cath ([facility]): patent grafts->Med Rx; f. 08/2015 Cath: patent grafts; g. 11/2016 Cath: LM min irregs, LAD 20p, 74m LCX 100p/m, RCA 20ost, 10p/m/d, VG->OM1 nl, LIMA->dLAD nl; f. 07/2017 MV: No isch, EF 51%.  . Cardiomyopathy, ischemic    a. 04/2012 Echo: EF 40-45%;  b. 08/2013 Echo: EF 45-50%; c. 01/2015 Echo: EF 40-45%; d. 07/2016 Echo: EF 60-65%; e. 09/2017 Echo: EF 55-60%, Gr1 DD.  . Carotid arterial disease ([place]    a. 05/2013 Carotid U/S; bilat 40-50% ICA stenosis.  . Chronic Chest Pain   . Chronic systolic CHF (congestive heart failure) ([facility] [place]    a. 01/2015 Echo: EF 40-45%; b. 07/2016 Echo: EF 60-65%; c. 09/2017 Echo: EF 55-60%, Gr1 DD, nl RV fxn.  . Cough   . Diverticulosis   . Headache 1970's   migraine  . History of blood transfusion 2016   post op  . Hyperlipidemia   . Hypertension   . Iron deficiency anemia  due to chronic blood loss 09/11/2016  . Myocardial infarction ([facility]    unsure of when  . Pulmonary emboli ([place] 07/2016   a. On Xarelto.  . Reflux esophagitis   . Sternal pain    a. 03/2016 s/p Sternal wire removal; b. 05/2016 s/p redo median sternotomy for sternal debridement and sternal plating.  . Tubular adenoma of colon     SURGICAL HISTORY: Past Surgical History:  Procedure Laterality Date  . CARDIAC CATHETERIZATION  05/01/2012  . CARDIAC CATHETERIZATION  04/2013   [facility];x3 stent  . CARDIAC CATHETERIZATION  01/11/15    [facility]  . CARDIAC CATHETERIZATION N/A 09/16/2015   Procedure: LEFT HEART CATH AND CORS/GRAFTS ANGIOGRAPHY;  Surgeon: TMinna Merritts MD;  [facility];  Service: Cardiovascular;  Laterality: N/A;  . CARPAL TUNNEL RELEASE     right hand  . CATARACT EXTRACTION     LEFT  . CATARACT EXTRACTION W/PHACO Left 09/16/2014   Procedure: CATARACT EXTRACTION PHACO AND INTRAOCULAR LENS PLACEMENT (IOC);  Surgeon: CLeandrew Koyanagi MD;  [place]  Service: Ophthalmology;  Laterality: Left;  . COLONOSCOPY    . COLONOSCOPY WITH PROPOFOL N/A 01/28/2016   Procedure: COLONOSCOPY WITH PROPOFOL;  Surgeon: MLollie Sails MD;  Location: [facility] ENDOSCOPY;  Service: Endoscopy;  Laterality: N/A;  . CORONARY ANGIOPLASTY WITH STENT PLACEMENT  04/13/2014  . CORONARY ARTERY BYPASS GRAFT  06-26-14   x3 bypasses  . DE QUERVAIN'S RELEASE Left 08/22/2012  . ESOPHAGOGASTRODUODENOSCOPY (EGD) WITH PROPOFOL N/A 04/26/2015   Procedure: ESOPHAGOGASTRODUODENOSCOPY (EGD) WITH PROPOFOL;  Surgeon: Hulen Luster, MD;  [facility]COPY;  Service: Gastroenterology;  Laterality: N/A;  . ESOPHAGOGASTRODUODENOSCOPY (EGD) WITH PROPOFOL N/A 05/29/2017   Procedure: ESOPHAGOGASTRODUODENOSCOPY (EGD) WITH PROPOFOL;  Surgeon: Lollie Sails, MD;  Location: [facility] ENDOSCOPY;  Service: Endoscopy;  Laterality: N/A;  . LEFT HEART CATH AND CORONARY ANGIOGRAPHY N/A 12/04/2016   Procedure:  LEFT HEART CATH AND CORS/GRAFTS  ANGIOGRAPHY;  Surgeon: Wellington Hampshire, MD;  Location: [place] [facility];  Service: Cardiovascular;  Laterality: N/A;  . RIB PLATING N/A 05/25/2016   Procedure: STERNAL PLATING;  Surgeon: Melrose Nakayama, MD;  Location: [place];  Service: Thoracic;  Laterality: N/A;  . right shoulder    . STERNAL WIRES REMOVAL N/A 03/30/2016   Procedure: STERNAL WIRES REMOVAL;  Surgeon: Melrose Nakayama, MD;  Location: [place];  Service: Thoracic;  Laterality: N/A;  . TONSILLECTOMY      SOCIAL HISTORY: Social History   Socioeconomic History  . Marital status: Married    Spouse name: Not on file  . Number of children: Not on file  . Years of education: Not on file  . Highest education level: Not on file  Occupational History  . Not on file  Social Needs  . Financial resource strain: Not on file  . Food insecurity:    Worry: Not on file    Inability: Not on file  . Transportation needs:    Medical: Not on file    Non-medical: Not on file  Tobacco Use  . Smoking status: Former Smoker    Packs/day: 1.00    Years: 35.00    Pack years: 35.00    Types: Cigarettes    Start date: 05/02/2009  . Smokeless tobacco: Former Systems developer  . Tobacco comment: quit in 2006  Substance and Sexual Activity  . Alcohol use: No  . Drug use: No  . Sexual activity: Yes  Lifestyle  . Physical activity:    Days per week: Not on file    Minutes per session: Not on file  . Stress: Not on file  Relationships  . Social connections:    Talks on phone: Not on file    Gets together: Not on file    Attends religious service: Not on file    Active member of club or organization: Not on file    Attends meetings of clubs or organizations: Not on file    Relationship status: Not on file  . Intimate partner violence:    Fear of current or ex partner: Not on file    Emotionally abused: Not on file    Physically abused: Not on file    Forced sexual activity: Not on file  Other Topics  Concern  . Not on file  Social History Narrative  . Not on file    FAMILY HISTORY Family History  Problem Relation Age of Onset  . Heart attack Father 75       MI  . Cancer Maternal Uncle     ALLERGIES:  is allergic to contrast media [iodinated diagnostic agents]; metrizamide; oxycodone hcl; and vicodin [hydrocodone-acetaminophen].  MEDICATIONS:  Current Outpatient Medications  Medication Sig Dispense Refill  . aspirin EC 81 MG  EC tablet Take 1 tablet (81 mg total) by mouth daily.    Marland Kitchen atorvastatin (LIPITOR) 80 MG tablet Take 1 tablet (80 mg total) by mouth daily at 6 PM. 30 tablet 0  . nitroGLYCERIN (NITROSTAT) 0.4 MG SL tablet Place 1 tablet (0.4 mg total) under the tongue every 5 (five) minutes as needed for chest pain. 25 tablet 6  . ranolazine (RANEXA) 500 MG 12 hr tablet Take 500 mg by mouth 2 (two) times daily.    . rivaroxaban (XARELTO) 20 MG TABS tablet Take 1 tablet (20 mg total) by mouth daily. 90 tablet 3   No current facility-administered medications for this visit.     PHYSICAL EXAMINATION:  ECOG PERFORMANCE STATUS: 1 - Symptomatic but completely ambulatory   Vitals:   11/02/17 0831  BP: 122/73  Pulse: 61  Resp: 18  Temp: (!) 96.3 F (35.7 C)  SpO2: 100%    Filed Weights   11/02/17 0831  Weight: 175 lb (79.4 kg)     Physical Exam  Constitutional: He is oriented to person, place, and time. No distress.  HENT:  Head: Normocephalic and atraumatic.  Nose: Nose normal.  Mouth/Throat: Oropharynx is clear and moist. No oropharyngeal exudate.  Eyes: Pupils are equal, round, and reactive to light. Conjunctivae and EOM are normal. No scleral icterus.  Neck: Normal range of motion. Neck supple.  Cardiovascular: Normal rate and regular rhythm.  No murmur heard. Pulmonary/Chest: Effort normal and breath sounds normal. No respiratory distress. He has no rales. He exhibits no tenderness.  Abdominal: Soft. Bowel sounds are normal. He exhibits no distension.  There is no tenderness.  Musculoskeletal: Normal range of motion. He exhibits no edema.  Neurological: He is alert and oriented to person, place, and time. No cranial nerve deficit. He exhibits normal muscle tone. Coordination normal.  Skin: Skin is warm and dry. He is not diaphoretic. No erythema.  Psychiatric: Affect and judgment normal.        LABORATORY DATA: I have personally reviewed the data as listed:  Appointment on 11/01/2017  Component Date Value Ref Range Status  . Iron 11/01/2017 85  45 - 182 ug/dL Final  . TIBC 11/01/2017 260  250 - 450 ug/dL Final  . Saturation Ratios 11/01/2017 33  17.9 - 39.5 % Final  . UIBC 11/01/2017 175  ug/dL Final   Performed at [facility], [address] [place], [place] [address]  . Ferritin 11/01/2017 131  24 - 336 ng/mL Final   Performed at [facility], [address] [place], [place] [address]  . WBC 11/01/2017 5.0  4.0 - 10.5 K/uL Final  . RBC 11/01/2017 4.62  4.22 - 5.81 MIL/uL Final  . Hemoglobin 11/01/2017 14.5  13.0 - 17.0 g/dL Final  . HCT 11/01/2017 44.2  39.0 - 52.0 % Final  . MCV 11/01/2017 95.7  80.0 - 100.0 fL Final  . MCH 11/01/2017 31.4  26.0 - 34.0 pg Final  . MCHC 11/01/2017 32.8  30.0 - 36.0 g/dL Final  . RDW 11/01/2017 14.0  11.5 - 15.5 % Final  . Platelets 11/01/2017 184  150 - 400 K/uL Final  . nRBC 11/01/2017 0.0  0.0 - 0.2 % Final  . Neutrophils Relative % 11/01/2017 61  % Final  . Neutro Abs 11/01/2017 3.0  1.7 - 7.7 K/uL Final  . Lymphocytes Relative 11/01/2017 28  % Final  . Lymphs Abs 11/01/2017 1.4  0.7 - 4.0 K/uL Final  . Monocytes Relative 11/01/2017 10  %  Final  . Monocytes Absolute 11/01/2017 0.5  0.1 - 1.0 K/uL Final  . Eosinophils Relative 11/01/2017 1  % Final  . Eosinophils Absolute 11/01/2017 0.1  0.0 - 0.5 K/uL Final  . Basophils Relative 11/01/2017 0  % Final  . Basophils Absolute 11/01/2017 0.0  0.0 - 0.1 K/uL Final  . Immature Granulocytes 11/01/2017 0  % Final  . Abs  Immature Granulocytes 11/01/2017 0.02  0.00 - 0.07 K/uL Final   Performed at [facility], [address] [place], [place] [address]  Admission on 10/02/2017, Discharged on 10/04/2017  Component Date Value Ref Range Status  . WBC 10/02/2017 4.9  3.8 - 10.6 K/uL Final  . RBC 10/02/2017 4.59  4.40 - 5.90 MIL/uL Final  . Hemoglobin 10/02/2017 14.8  13.0 - 18.0 g/dL Final  . HCT 10/02/2017 43.0  40.0 - 52.0 % Final  . MCV 10/02/2017 93.8  80.0 - 100.0 fL Final  . MCH 10/02/2017 32.3  26.0 - 34.0 pg Final  . MCHC 10/02/2017 34.5  32.0 - 36.0 g/dL Final  . RDW 10/02/2017 14.5  11.5 - 14.5 % Final  . Platelets 10/02/2017 143* 150 - 440 K/uL Final   Performed at [facility], [address] [place], [place] [address]  . Sodium 10/02/2017 135  135 - 145 mmol/L Final  . Potassium 10/02/2017 4.2  3.5 - 5.1 mmol/L Final  . Chloride 10/02/2017 103  98 - 111 mmol/L Final  . CO2 10/02/2017 27  22 - 32 mmol/L Final  . Glucose, Bld 10/02/2017 101* 70 - 99 mg/dL Final  . BUN 10/02/2017 18  8 - 23 mg/dL Final  . Creatinine, Ser 10/02/2017 1.29* 0.61 - 1.24 mg/dL Final  . Calcium 10/02/2017 7.9* 8.9 - 10.3 mg/dL Final  . Total Protein 10/02/2017 6.1* 6.5 - 8.1 g/dL Final  . Albumin 10/02/2017 3.2* 3.5 - 5.0 g/dL Final  . AST 10/02/2017 24  15 - 41 U/L Final  . ALT 10/02/2017 23  0 - 44 U/L Final  . Alkaline Phosphatase 10/02/2017 44  38 - 126 U/L Final  . Total Bilirubin 10/02/2017 0.9  0.3 - 1.2 mg/dL Final  . GFR calc non Af Amer 10/02/2017 53* >60 mL/min Final  . GFR calc Af Amer 10/02/2017 >60  >60 mL/min Final   Comment: (NOTE) The eGFR has been calculated using the CKD EPI equation. This calculation has not been validated in all clinical situations. eGFR's persistently <60 mL/min signify possible Chronic Kidney Disease.   Georgiann Hahn gap 10/02/2017 5  5 - 15 Final   Performed at [facility], [address] [place], [place] [address]  . Troponin I 10/02/2017 <0.03   <0.03 ng/mL Final   Performed at [facility], [address] [place], [place] [address]  . Troponin I 10/02/2017 <0.03  <0.03 ng/mL Final   Performed at [facility], [address] [place], [place] [address]  . Troponin I 10/02/2017 <0.03  <0.03 ng/mL Final   Performed at [facility], [address] [place], [place] [address]  . Troponin I 10/03/2017 <0.03  <0.03 ng/mL Final   Performed at [facility], [address] [place],  [address]  . Cholesterol 10/03/2017 128  0 - 200 mg/dL Final  . Triglycerides 10/03/2017 70  <150 mg/dL Final  . HDL 10/03/2017 46  >40 mg/dL Final  . Total CHOL/HDL Ratio 10/03/2017 2.8  RATIO Final  . VLDL 10/03/2017 14  0 - 40  mg/dL Final  . LDL Cholesterol 10/03/2017 68  0 - 99 mg/dL Final   Comment:        Total Cholesterol/HDL:CHD Risk Coronary Heart Disease Risk Table                     Men   Women  1/2 Average Risk   3.4   3.3  Average Risk       5.0   4.4  2 X Average Risk   9.6   7.1  3 X Average Risk  23.4   11.0        Use the calculated Patient Ratio above and the CHD Risk Table to determine the patient's CHD Risk.        ATP III CLASSIFICATION (LDL):  <100     mg/dL   Optimal  100-129  mg/dL   Near or Above                    Optimal  130-159  mg/dL   Borderline  160-189  mg/dL   High  >190     mg/dL   Very High Performed at [facility], [address], [place], [place] [address]   . Sodium 10/03/2017 137  135 - 145 mmol/L Final  . Potassium 10/03/2017 4.3  3.5 - 5.1 mmol/L Final  . Chloride 10/03/2017 105  98 - 111 mmol/L Final  . CO2 10/03/2017 27  22 - 32 mmol/L Final  . Glucose, Bld 10/03/2017 100* 70 - 99 mg/dL Final  . BUN 10/03/2017 16  8 - 23 mg/dL Final  . Creatinine, Ser 10/03/2017 1.17  0.61 - 1.24 mg/dL Final  . Calcium 10/03/2017 8.1* 8.9 - 10.3 mg/dL Final  . GFR calc non Af Amer 10/03/2017 59* >60 mL/min Final  . GFR calc Af Amer 10/03/2017 >60  >60  mL/min Final   Comment: (NOTE) The eGFR has been calculated using the CKD EPI equation. This calculation has not been validated in all clinical situations. eGFR's persistently <60 mL/min signify possible Chronic Kidney Disease.   Georgiann Hahn gap 10/03/2017 5  5 - 15 Final   Performed at [facility], [address] [place], [place] [address]  . Weight 10/02/2017 2,880  oz Final  . Height 10/02/2017 70  in Final  . BP 10/02/2017 95/57  mmHg Final  . Glucose-Capillary 10/04/2017 79  70 - 99 mg/dL Final   Lab Results  Component Value Date   IRON 85 11/01/2017   TIBC 260 11/01/2017   IRONPCTSAT 33 11/01/2017   Lab Results  Component Value Date   FERRITIN 131 11/01/2017     RADIOGRAPHIC STUDIES: I have personally reviewed the radiological images as listed and agree with the findings in the report 08/08/2016 1. 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 No CT evidence of right heart straining. 2. Multi vessel coronary vascular calcification and CABG surgery changes.  01/28/2016 colonoscopy showed diverticulosis of sigmoid colon and distant descending colon. 04/26/2015 EGD normal. 05/29/2017 EGD normal.  ASSESSMENT/PLAN 1. Iron deficiency anemia secondary to blood loss (chronic)   2. Chronic anticoagulation   3. Hemoptysis    #Labs reviewed and discussed with patient.  Hemoglobin stable.  Iron panel indicating adequate iron stores.  Hold additional IV iron. #Hemoptysis: No lung mass or nodules on multiple CT  Chest scans, with last one done 1 year ago.  Recent x-rays  reviewed which showed no acute process.  Patient is on anticoagulation.  I discussed with patient and for now I will hold additional imaging.  If he continues to cough out blood we will proceed with a CT scan.  # Follow up with me in 6 months with  CBC, iron TIBC, ferritin repeat prior to the visit.   All questions were  answered. The patient knows to call the clinic with any problems, questions or concerns. Total face to face encounter time for this patient visit was 15 min. >50% of the time was  spent in counseling and coordination of care.  Earlie Server, MD, PhD Hematology Oncology [facility] Pager- [phone] 11/02/2017

## 2017-11-02 NOTE — Progress Notes (Signed)
Patient here for follow up. Complains of having increased  shortness of breath with minimal exertion. Also, has been having chest pain.

## 2017-11-07 DIAGNOSIS — R69 Illness, unspecified: Secondary | ICD-10-CM | POA: Diagnosis not present

## 2017-11-09 ENCOUNTER — Emergency Department
Admission: EM | Admit: 2017-11-09 | Discharge: 2017-11-09 | Disposition: A | Discharge status: Home / Self Care | Payer: Medicare HMO | Attending: Emergency Medicine | Admitting: Emergency Medicine

## 2017-11-09 ENCOUNTER — Emergency Department: Payer: Medicare HMO

## 2017-11-09 ENCOUNTER — Ambulatory Visit (INDEPENDENT_AMBULATORY_CARE_PROVIDER_SITE_OTHER): Payer: Medicare HMO

## 2017-11-09 ENCOUNTER — Other Ambulatory Visit: Payer: Self-pay

## 2017-11-09 ENCOUNTER — Encounter: Payer: Self-pay | Admitting: Emergency Medicine

## 2017-11-09 DIAGNOSIS — R042 Hemoptysis: Secondary | ICD-10-CM | POA: Insufficient documentation

## 2017-11-09 DIAGNOSIS — Z79899 Other long term (current) drug therapy: Secondary | ICD-10-CM | POA: Diagnosis not present

## 2017-11-09 DIAGNOSIS — I5022 Chronic systolic (congestive) heart failure: Secondary | ICD-10-CM | POA: Diagnosis not present

## 2017-11-09 DIAGNOSIS — I2510 Atherosclerotic heart disease of native coronary artery without angina pectoris: Secondary | ICD-10-CM | POA: Insufficient documentation

## 2017-11-09 DIAGNOSIS — Z7901 Long term (current) use of anticoagulants: Secondary | ICD-10-CM | POA: Insufficient documentation

## 2017-11-09 DIAGNOSIS — I110 Hypertensive heart disease with heart failure: Secondary | ICD-10-CM | POA: Diagnosis not present

## 2017-11-09 DIAGNOSIS — R918 Other nonspecific abnormal finding of lung field: Secondary | ICD-10-CM | POA: Diagnosis not present

## 2017-11-09 DIAGNOSIS — R55 Syncope and collapse: Secondary | ICD-10-CM

## 2017-11-09 DIAGNOSIS — Z7982 Long term (current) use of aspirin: Secondary | ICD-10-CM | POA: Diagnosis not present

## 2017-11-09 DIAGNOSIS — R001 Bradycardia, unspecified: Secondary | ICD-10-CM | POA: Diagnosis not present

## 2017-11-09 DIAGNOSIS — R079 Chest pain, unspecified: Secondary | ICD-10-CM | POA: Diagnosis not present

## 2017-11-09 DIAGNOSIS — Z87891 Personal history of nicotine dependence: Secondary | ICD-10-CM | POA: Diagnosis not present

## 2017-11-09 DIAGNOSIS — Z951 Presence of aortocoronary bypass graft: Secondary | ICD-10-CM | POA: Insufficient documentation

## 2017-11-09 LAB — BASIC METABOLIC PANEL
Anion gap: 6 (ref 5–15)
BUN: 18 mg/dL (ref 8–23)
CALCIUM: 9.3 mg/dL (ref 8.9–10.3)
CO2: 28 mmol/L (ref 22–32)
CREATININE: 1.11 mg/dL (ref 0.61–1.24)
Chloride: 106 mmol/L (ref 98–111)
GLUCOSE: 85 mg/dL (ref 70–99)
Potassium: 4 mmol/L (ref 3.5–5.1)
Sodium: 140 mmol/L (ref 135–145)

## 2017-11-09 LAB — CBC
HCT: 46 % (ref 39.0–52.0)
Hemoglobin: 15.3 g/dL (ref 13.0–17.0)
MCH: 32.4 pg (ref 26.0–34.0)
MCHC: 33.3 g/dL (ref 30.0–36.0)
MCV: 97.5 fL (ref 80.0–100.0)
PLATELETS: 197 10*3/uL (ref 150–400)
RBC: 4.72 MIL/uL (ref 4.22–5.81)
RDW: 14 % (ref 11.5–15.5)
WBC: 5.3 10*3/uL (ref 4.0–10.5)
nRBC: 0 % (ref 0.0–0.2)

## 2017-11-09 LAB — TYPE AND SCREEN
ABO/RH(D): A POS
Antibody Screen: NEGATIVE

## 2017-11-09 LAB — TROPONIN I: Troponin I: <0.03 ng/mL (ref <0.03)

## 2017-11-09 LAB — PROTIME-INR
INR: 1.79
Prothrombin Time: 20.6 seconds — ABNORMAL HIGH (ref 11.4–15.2)

## 2017-11-09 MED ORDER — ONDANSETRON 4 MG PO TBDP: 4.0000 mg | ORAL_TABLET | Freq: Three times a day (TID) | ORAL | 0 refills | Status: DC | PRN

## 2017-11-09 MED ORDER — AMOXICILLIN-POT CLAVULANATE 875-125 MG PO TABS: 1.0000 | ORAL_TABLET | Freq: Two times a day (BID) | ORAL | 0 refills | Status: DC

## 2017-11-09 NOTE — ED Triage Notes (Signed)
Pt arrived via POV with reports of coughing up large amount of bright red blood last night, pt states he has not had any bloody sputum today. Pt is on Xarelto and has hx of PE in the LUL extending into pulmonary artery.  Pt is being followed by Dr. Tasia Catchings at the [facility] center and had an appt on 11/02/17.    Pt reports shortness of breath when exerting himself for extended period of time, pt also reports chest pain.

## 2017-11-09 NOTE — Discharge Instructions (Signed)
Discontinue your Xarelto for 3 days while starting the antibiotic.  This should allow the bleeding to stop, and then you can resume the Xarelto while finishing your antibiotic course.  Follow-up with your doctor this coming week for continued monitoring of your symptoms.

## 2017-11-09 NOTE — ED Provider Notes (Signed)
[facility] Emergency Department Provider Note  ____________________________________________  Time seen: Approximately 3:17 PM  I have reviewed the triage vital signs and the nursing notes.   HISTORY  Chief Complaint Hemoptysis and Chest Pain    HPI Scott [name] is a 77 y.o. male with a history of CAD ischemic cardiomyopathy diverticulosis hypertension and a PE from July 2018 on Xarelto who reports  some increasing shortness of breath and intermittent hemoptysis over the past several days.  He has a history of left upper lobe PE.  Has been compliant with Xarelto.  Denies fever chills sweats or chest pain.  No trauma.  Symptoms are intermittent, no aggravating or alleviating factors.  No radiating pain.     Past Medical History:  Diagnosis Date  . Arthritis    right hip, since a fall  . CAD (coronary artery disease)    a. 04/2012 Cath: 3VD->Med Rx;  b. 04/2013 PCI RCA (2 DES); c. 03/2014 PCI: LAD 80 (3.0x23 Xience [place] DES); d. 06/2014 CABG x 2 ([facility]) LIMA->LAD, VG->OM; e. 12/2014 Cath ([facility]): patent grafts->Med Rx; f. 08/2015 Cath: patent grafts; g. 11/2016 Cath: LM min irregs, LAD 20p, 21m, LCX 100p/m, RCA 20ost, 10p/m/d, VG->OM1 nl, LIMA->dLAD nl; f. 07/2017 MV: No isch, EF 51%.  . Cardiomyopathy, ischemic    a. 04/2012 Echo: EF 40-45%;  b. 08/2013 Echo: EF 45-50%; c. 01/2015 Echo: EF 40-45%; d. 07/2016 Echo: EF 60-65%; e. 09/2017 Echo: EF 55-60%, Gr1 DD.  . Carotid arterial disease ([place])    a. 05/2013 Carotid U/S; bilat 40-50% ICA stenosis.  . Chronic Chest Pain   . Chronic systolic CHF (congestive heart failure) ([place])    a. 01/2015 Echo: EF 40-45%; b. 07/2016 Echo: EF 60-65%; c. 09/2017 Echo: EF 55-60%, Gr1 DD, nl RV fxn.  . Cough   . Diverticulosis   . Headache 1970's   migraine  . History of blood transfusion 2016   post op  . Hyperlipidemia   . Hypertension   . Iron deficiency anemia due to chronic blood loss 09/11/2016  . Myocardial infarction [facility])    unsure of when  . Pulmonary emboli ([employer]) 07/2016   a. On Xarelto.  . Reflux esophagitis   . Sternal pain    a. 03/2016 s/p Sternal wire removal; b. 05/2016 s/p redo median sternotomy for sternal debridement and sternal plating.  . Tubular adenoma of colon      Patient Active Problem List   Diagnosis Date Noted  . Chest pain 10/02/2017  . Syncope 11/19/2016  . 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 Iron deficiency anemia due to chronic blood loss 09/11/2016  . Pulmonary embolus ([place]) 08/31/2016  . Panic disorder   . 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 Panic attack 08/04/2016  . 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 Coronary artery disease involving native coronary artery of native heart with angina pectoris with documented spasm ([place])   . Hx of CABG   . Ischemic cardiomyopathy   . Chronic systolic CHF (congestive heart failure) ([place])   . Chronic Chest Pain   . Coronary artery disease involving coronary bypass graft of native heart with angina pectoris with documented spasm ([place])   . Angina pectoris ([place])   . Anxiety   . Hyperlipemia   . Chronic diastolic heart failure ([place]) 12/21/2014  . Unstable angina ([place]) 12/08/2014  . PAF (paroxysmal atrial fibrillation) ([place]) 12/08/2014  . Chronic anticoagulation 12/08/2014  . Anemia 04/20/2014  . 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  Cardiomyopathy, ischemic   . Essential hypertension   . Bilateral carotid artery disease ([place])   . S/P drug eluting coronary stent placement 05/30/2013  . Chest pain with low risk for cardiac etiology 05/05/2013  . SOB (shortness of breath) 05/05/2013  . Coronary artery disease involving coronary bypass graft of native heart with angina pectoris ([place]) 05/05/2013  . Hyperlipidemia 05/05/2013  . History of smoking 05/05/2013  . Cardiac angina ([place]) 05/05/2013     Past Surgical History:  Procedure Laterality Date  . CARDIAC CATHETERIZATION  05/01/2012  . CARDIAC CATHETERIZATION  04/2013    [facility];x3 stent  . CARDIAC CATHETERIZATION  01/11/15    [facility]  . CARDIAC CATHETERIZATION N/A 09/16/2015   Procedure: LEFT HEART CATH AND CORS/GRAFTS ANGIOGRAPHY;  Surgeon: Minna Merritts, MD;  Location: [place] CV [facility];  Service: Cardiovascular;  Laterality: N/A;  . CARPAL TUNNEL RELEASE     right hand  . CATARACT EXTRACTION     LEFT  . CATARACT EXTRACTION W/PHACO Left 09/16/2014   Procedure: CATARACT EXTRACTION PHACO AND INTRAOCULAR LENS PLACEMENT (IOC);  Surgeon: Leandrew Koyanagi, MD;  Location: [place];  Service: Ophthalmology;  Laterality: Left;  . COLONOSCOPY    . COLONOSCOPY WITH PROPOFOL N/A 01/28/2016   Procedure: COLONOSCOPY WITH PROPOFOL;  Surgeon: Lollie Sails, MD;  Location: [facility] ENDOSCOPY;  Service: Endoscopy;  Laterality: N/A;  . CORONARY ANGIOPLASTY WITH STENT PLACEMENT  04/13/2014  . CORONARY ARTERY BYPASS GRAFT  06-26-14   x3 bypasses  . DE QUERVAIN'S RELEASE Left 08/22/2012  . ESOPHAGOGASTRODUODENOSCOPY (EGD) WITH PROPOFOL N/A 04/26/2015   Procedure: ESOPHAGOGASTRODUODENOSCOPY (EGD) WITH PROPOFOL;  Surgeon: Hulen Luster, MD;  Location: [facility] ENDOSCOPY;  Service: Gastroenterology;  Laterality: N/A;  . ESOPHAGOGASTRODUODENOSCOPY (EGD) WITH PROPOFOL N/A 05/29/2017   Procedure: ESOPHAGOGASTRODUODENOSCOPY (EGD) WITH PROPOFOL;  Surgeon: Lollie Sails, MD;  Location: [facility] ENDOSCOPY;  Service: Endoscopy;  Laterality: N/A;  . LEFT HEART CATH AND CORONARY ANGIOGRAPHY N/A 12/04/2016   Procedure: LEFT HEART CATH AND CORS/GRAFTS  ANGIOGRAPHY;  Surgeon: Wellington Hampshire, MD;  Location: [place] [facility];  Service: Cardiovascular;  Laterality: N/A;  . RIB PLATING N/A 05/25/2016   Procedure: STERNAL PLATING;  Surgeon: Melrose Nakayama, MD;  Location: [place];  Service: Thoracic;  Laterality: N/A;  . right shoulder    . STERNAL WIRES REMOVAL N/A 03/30/2016   Procedure: STERNAL WIRES REMOVAL;  Surgeon: Melrose Nakayama, MD;  Location: [place];  Service: Thoracic;   Laterality: N/A;  . TONSILLECTOMY       Prior to Admission medications   Medication Sig Start Date End Date Taking? Authorizing Provider  amoxicillin-clavulanate (AUGMENTIN) 875-125 MG tablet Take 1 tablet by mouth 2 (two) times daily. 11/09/17   Carrie Mew, MD  aspirin EC 81 MG EC tablet Take 1 tablet (81 mg total) by mouth daily. 10/05/17   Nicholes Mango, MD  atorvastatin (LIPITOR) 80 MG tablet Take 1 tablet (80 mg total) by mouth daily at 6 PM. 10/04/17   Gouru, Illene Silver, MD  nitroGLYCERIN (NITROSTAT) 0.4 MG SL tablet Place 1 tablet (0.4 mg total) under the tongue every 5 (five) minutes as needed for chest pain. 04/26/17   Minna Merritts, MD  ondansetron (ZOFRAN ODT) 4 MG disintegrating tablet Take 1 tablet (4 mg total) by mouth every 8 (eight) hours as needed for nausea or vomiting. 11/09/17   Carrie Mew, MD  ranolazine (RANEXA) 500 MG 12 hr tablet Take 500 mg by mouth 2 (two) times daily.  [provider]  rivaroxaban (XARELTO) 20 MG TABS tablet Take 1 tablet (20 mg total) by mouth daily. 08/14/17   Minna Merritts, MD     Allergies Contrast media [iodinated diagnostic agents]; Metrizamide; Oxycodone hcl; and Vicodin [hydrocodone-acetaminophen]   Family History  Problem Relation Age of Onset  . Heart attack Father 73       MI  . Cancer Maternal Uncle     Social History Social History   Tobacco Use  . Smoking status: Former Smoker    Packs/day: 1.00    Years: 35.00    Pack years: 35.00    Types: Cigarettes    Start date: 05/02/2009  . Smokeless tobacco: Former Systems developer  . Tobacco comment: quit in 2006  Substance Use Topics  . Alcohol use: No  . Drug use: No    Review of Systems  Constitutional:   No fever or chills.  ENT:   No sore throat. No rhinorrhea. Cardiovascular:   Positive chest discomfort with coughing, without syncope. Respiratory: Positive dyspnea on exertion with blood-tinged cough. Gastrointestinal:   Negative for abdominal pain,  vomiting and diarrhea.  Musculoskeletal:   Negative for focal pain or swelling All other systems reviewed and are negative except as documented above in ROS and HPI.  ____________________________________________   PHYSICAL EXAM:  VITAL SIGNS: ED Triage Vitals  Enc Vitals Group     BP 11/09/17 1420 131/74     Pulse Rate 11/09/17 1420 (!) 56     Resp 11/09/17 1420 20     Temp --      Temp src --      SpO2 11/09/17 1420 98 %     Weight 11/09/17 1229 175 lb (79.4 kg)     Height --      Head Circumference --      Peak Flow --      Pain Score 11/09/17 1230 1     Pain Loc --      Pain Edu? --      Excl. in [place]? --     Vital signs reviewed, nursing assessments reviewed.   Constitutional:   Alert and oriented. Non-toxic appearance. Eyes:   Conjunctivae are normal. EOMI. PERRL. ENT      Head:   Normocephalic and atraumatic.      Nose:   No congestion/rhinnorhea.       Mouth/Throat:   MMM, no pharyngeal erythema. No peritonsillar mass.       Neck:   No meningismus. Full ROM. Hematological/Lymphatic/Immunilogical:   No cervical lymphadenopathy. Cardiovascular:   RRR. Symmetric bilateral radial and DP pulses.  No murmurs. Cap refill less than 2 seconds. Respiratory:   Normal respiratory effort without tachypnea/retractions. Breath sounds are clear and equal bilaterally. No wheezes/rales/rhonchi. Gastrointestinal:   Soft and nontender. Non distended. There is no CVA tenderness.  No rebound, rigidity, or guarding.  Musculoskeletal:   Normal range of motion in all extremities. No joint effusions.  No lower extremity tenderness.  No edema. Neurologic:   Normal speech and language.  Motor grossly intact. No acute focal neurologic deficits are appreciated.  Skin:    Skin is warm, dry and intact. No rash noted.  No petechiae, purpura, or bullae.  ____________________________________________    LABS (pertinent positives/negatives) (all labs ordered are listed, but only abnormal  results are displayed) Labs Reviewed  PROTIME-INR - Abnormal; Notable for the following components:      Result Value   Prothrombin Time 20.6 (*)    All  other components within normal limits  BASIC METABOLIC PANEL  CBC  TROPONIN I  TYPE AND SCREEN   ____________________________________________   EKG Interpreted by me Sinus bradycardia rate of 58, left axis, first-degree AV block.  Normal QRS ST segments and T waves.   ____________________________________________    RADIOLOGY  Dg Chest 2 View  Result Date: 11/09/2017 CLINICAL DATA:  Chest pain and hemoptysis EXAM: CHEST - 2 VIEW COMPARISON:  10/02/2017 FINDINGS: Normal heart size and mediastinal contours. Prior CABG and coronary stenting. Both views considered there is no edema, consolidation, effusion, or pneumothorax. IMPRESSION: No evidence of active disease. Electronically Signed   By: Monte Fantasia M.D.   On: 11/09/2017 13:37   Ct Chest Wo Contrast  Result Date: 11/09/2017 CLINICAL DATA:  Gross hemoptysis. Chronic anticoagulation after acute pulmonary embolism in July, 2018. Allergy to IV contrast. EXAM: CT CHEST WITHOUT CONTRAST TECHNIQUE: Multidetector CT imaging of the chest was performed following the standard protocol without IV contrast. COMPARISON:  Prior CTA of the chest on 11/07/2016 and 08/08/2016. Visualized lung bases on CT of the abdomen dated 04/03/2017 FINDINGS: Cardiovascular: Normal heart size. Thoracic aorta and central pulmonary arteries are of normal caliber. No pericardial fluid. Mediastinum/Nodes: No lymphadenopathy identified. No mediastinal masses. Lungs/Pleura: No pulmonary nodules or masses. There is mild patchy ground-glass opacity in the posterior and inferior right lower lobe not present previously. This could relate to some hemorrhage, infectious process or inflammatory process. Some regional bronchi in this area also appear mildly dilated compared to other areas of the lungs. No pneumothorax. No  pleural effusions. Upper Abdomen: No acute abnormality. Musculoskeletal: No chest wall mass or suspicious bone lesions identified. IMPRESSION: Mild patchy ground-glass opacity in the posterior and inferior right lower lobe which was not present on prior imaging. This could relate to focal hemorrhage, infection or inflammatory process. Some adjacent bronchi also appear mildly dilated in this area. Electronically Signed   By: Aletta Edouard M.D.   On: 11/09/2017 14:46    ____________________________________________   PROCEDURES Procedures  ____________________________________________  DIFFERENTIAL DIAGNOSIS   Bronchitis, pneumonia, pneumothorax, pulmonary hemorrhage  CLINICAL IMPRESSION / ASSESSMENT AND PLAN / ED COURSE  Pertinent labs & imaging results that were available during my care of the patient were reviewed by me and considered in my medical decision making (see chart for details).    Patient presents with hemoptysis and increased shortness of breath.  Overall  not in distress.  Due to his hemoptysis on Xarelto, obtain CT scan which does show some opacity in the right lower lobe concerning for pneumonia versus a focal collection of blood.  Discussed with his cardiologist Dr. Rockey Situ who agrees with antibiotics and says would be fine to stop the Xarelto for a few days to allow the bleeding to stop at which point he could resume.  Recommend follow-up this week with his primary care doctor.  Return to ED if any symptoms worsen.      ____________________________________________   FINAL CLINICAL IMPRESSION(S) / ED DIAGNOSES    Final diagnoses:  Hemoptysis  Right middle lobe pulmonary infiltrate     ED Discharge Orders         Ordered    amoxicillin-clavulanate (AUGMENTIN) 875-125 MG tablet  2 times daily     11/09/17 1516    ondansetron (ZOFRAN ODT) 4 MG disintegrating tablet  Every 8 hours PRN     11/09/17 1516          Portions of this note were generated with  Lobbyist. Dictation errors may occur despite best attempts at proofreading.    Carrie Mew, MD 11/09/17 1530

## 2017-11-10 DIAGNOSIS — R55 Syncope and collapse: Secondary | ICD-10-CM | POA: Diagnosis not present

## 2017-12-06 NOTE — Progress Notes (Signed)
Cardiology Office Note  Date:  12/07/2017   ID:  RICKIE GANGE, DOB Dec 22, 1940, MRN [id number]  PCP:  Baxter Hire, MD   Chief Complaint  Patient presents with  . Other    2 month follow up. Patient wore monitor. Patient c/o SOB and Chest pain. Meds reviewed verbally with patient.     HPI:  Mr. Harker is a 77 year old gentleman with history of  Chronic chest pain syndrome/anxiety/panic attacks CAD, stenting CABG 2 with LIMA to the LAD 06/2014, vein graft to the OM at [facility],  chronic chest pain not relieved with bypass surgery,  Redo median sternotomy for sternal plating  on 05/25/2016 EF 40 to 45% 01/2015 Smoker, age 87 to 75 quit 2005 Pulmonary embolism July 2018, workup through hematology Postoperative atrial fibrillation at [facility] June 2016 following CABG Last ischemic work-up stress test July 2019 who presents for follow-up of his anxiety and chronic stable angina pectoralis  Rare dizzy spells, lasting for seconds at a time  feels that he has these daily  He is also concerned about taking more sublingual nitroglycerin Isosorbide recently held for orthostatic hypotension and hospitalization He is concerned that he needs to take nitroglycerin at all  Following recent hospitalization event monitor was ordered Details were pulled up and discussed with him That showed Normal sinus rhythm Avg HR of 67 bpm.  46 atrial tachycardia runs, the longest lasting 14.5 secs with an avg rate of 103 bpm. Patient triggered events appear to correlate with PVCs Isolated SVEs were rare (<1.0%), SVE Couplets were rare (<1.0%), and SVE Triplets were rare (<1.0%). Isolated VEs were occasional (1.8%, 23191), VE Couplets were rare (<1.0%, 275), and VE Triplets were rare (<1.0%, 16). Ventricular Bigeminy and Trigeminy were present.  We reviewed recent events from September September 10, he was working in his yard and developed substernal chest discomfort.  He went and sat down and took a  sublingual nitroglycerin and then stood up and lost consciousness.  His wife called EMS and he was found to be hypotensive with a blood pressure of 60/40.  He was taken to the [facility] ED where he was treated with IV fluids.   We also noticed evaluation for hemoptysis, bronchitis, emergency room October 1 He reports the symptoms have resolved  Numbers from today's visit 150/84 supine, rate 77 134/70 sitting, rate 64  122/88 rate 65 standing 132/76 rate 67 standing 3 min   EKG personally reviewed by myself on todays visit Shows sinus bradycardia rate 56 bpm PVCs  Other past medical history reviewed  emergency room 08/02/2017 for chest pain Troponin negative, EKG unchanged Discharged home with outpatient stress test  Had stress test July 2019 in follow-up showing no ischemia Fixed defect basal mid inferior and inferolateral wall consistent with previous scar Ejection fraction 51%  Chest pain typically relieved with nitroglycerin Continues to have periodic chest pain as he has for many years Periodic shortness of breath Having some episodes of coughing often at nighttime sometimes just sitting there in the day  On last office visit was orthostatic, does not drink much fluids Denies orthostasis symptoms on today's visit  Notes reviewed for history as below ER visits Chest pain shortness of breath 03/20/17 Dizziness 01/26/2017 Chest pain 01/22/2017 Mental status change/anxiety  11/19/2016 Chest pain  11/07/2016 Chest pain  09/11/2016 SOB  08/04/2016 Chest pain 02/12/2016  Cath  12/04/2016 Cath 09/16/2015 Cath 01/11/2015  bypass surgery June 2016 at [facility]  Cath 05/29/2014, 50% LAD lesion, distal RCA disease  Cath 04/13/2014, DES to the mid LAD Cath 05/16/2013,  stent to the RCA  Stress test July 2019 no ischemia Stress test 11/08/2016 Stress test 06/15/2015 Stress test 04/21/2015 Stress test 04/08/2014 Stress test 07/29/2012 Stress test 04/22/2012  March 2019 Numerous  imaging studies of his abdomen, ultrasound, MRCP, All appeared essentially benign  hospitalization November 07, 2016 for chest pain Stress test showed no ischemia  Despite this he had cardiac catheterization 12/04/2016 for chest pain Medical management recommended  hospital November 19, 2016  Had involuntary leg movements, Described as myoclonus and mental status changes  CT scan of the head, was normal MRI performed, seen by neurology Showed sinusitis on MR  Felt to have possible panic attacks EEG unremarkable Zoloft was increased He was told not to drive by neurology for 1 month given acute onset of anxiety attack  Referred from pain clinic in [place] to CT surg for consult Bone scan:diffuse increased uptake in the manubrium and upper sternum sternal wire removal on 03/30/2016. Redo median sternotomy for sternal plating by Dr. Roxan Hockey on 05/25/2016. lung densely adherent to the underside of the sternum and there was a small laceration of the left upper lobe  history of CAD dating back to April 2014 at which time he was found to have severe distal RCA disease with left to right collaterals, a subtotally occluded left circumflex, and moderate, diffuse LAD disease. He was initially medically managed.   In April 2015, he underwent repeat catheterization secondary to ongoing chest pain, showing stable moderate to severe disease. He then underwent successful RCA stenting.   In March 2016, due to recurrent chest pain, he underwent successful stenting of the LAD.    he had recurrent chest pain again in June 2016 and was seen at [facility].  He underwent CABG 2 with a LIMA to the LAD and a vein graft to the obtuse marginal.   following CABG, he continued to have persistent/chronic low-level left-sided and midsternal chest pain.  He had repeat hospitalizations  with stress testing in November at [facility], which was negative,  catheterization at [facility] in December 2016, which apparently showed patent  grafts.  Lab work in the hospital  showing total cholesterol 142, LDL 77, HDL 47  Echocardiogram 03/02/2014 showing normal LV function, essentially normal study  Echocardiogram 04/22/2012 showing ejection fraction 40%, no mention of wall motion abnormality, mild MR, TR  PMH:   has a past medical history of Arthritis, CAD (coronary artery disease), Cardiomyopathy, ischemic, Carotid arterial disease ([place]), Chronic Chest Pain, Chronic systolic CHF (congestive heart failure) ([place]), Cough, Diverticulosis, Headache (1970's), History of blood transfusion (2016), Hyperlipidemia, Hypertension, Iron deficiency anemia due to chronic blood loss (09/11/2016), Myocardial infarction [facility]), Pulmonary emboli ([place]) (07/2016), Reflux esophagitis, Sternal pain, and Tubular adenoma of colon.  PSH:    Past Surgical History:  Procedure Laterality Date  . CARDIAC CATHETERIZATION  05/01/2012  . CARDIAC CATHETERIZATION  04/2013   [facility];x3 stent  . CARDIAC CATHETERIZATION  01/11/15    [facility]  . CARDIAC CATHETERIZATION N/A 09/16/2015   Procedure: LEFT HEART CATH AND CORS/GRAFTS ANGIOGRAPHY;  Surgeon: Minna Merritts, MD;  Location: [place] CV LAB;  Service: Cardiovascular;  Laterality: N/A;  . CARPAL TUNNEL RELEASE     right hand  . CATARACT EXTRACTION     LEFT  . CATARACT EXTRACTION W/PHACO Left 09/16/2014   Procedure: CATARACT EXTRACTION PHACO AND INTRAOCULAR LENS PLACEMENT (IOC);  Surgeon: Leandrew Koyanagi, MD;  Location: [place];  Service: Ophthalmology;  Laterality: Left;  .  COLONOSCOPY    . COLONOSCOPY WITH PROPOFOL N/A 01/28/2016   Procedure: COLONOSCOPY WITH PROPOFOL;  Surgeon: Lollie Sails, MD;  Location[facility] ENDOSCOPY;  Service: Endoscopy;  Laterality: N/A;  . CORONARY ANGIOPLASTY WITH STENT PLACEMENT  04/13/2014  . CORONARY ARTERY BYPASS GRAFT  06-26-14   x3 bypasses  . DE QUERVAIN'S RELEASE Left 08/22/2012  . ESOPHAGOGASTRODUODENOSCOPY (EGD) WITH PROPOFOL N/A 04/26/2015   Procedure:  ESOPHAGOGASTRODUODENOSCOPY (EGD) WITH PROPOFOL;  Surgeon: Hulen Luster, MD;  Location: [facility] ENDOSCOPY;  Service: Gastroenterology;  Laterality: N/A;  . ESOPHAGOGASTRODUODENOSCOPY (EGD) WITH PROPOFOL N/A 05/29/2017   Procedure: ESOPHAGOGASTRODUODENOSCOPY (EGD) WITH PROPOFOL;  Surgeon: Lollie Sails, MD;  Location: [facility] ENDOSCOPY;  Service: Endoscopy;  Laterality: N/A;  . LEFT HEART CATH AND CORONARY ANGIOGRAPHY N/A 12/04/2016   Procedure: LEFT HEART CATH AND CORS/GRAFTS  ANGIOGRAPHY;  Surgeon: Wellington Hampshire, MD;  Location: [place] CV LAB;  Service: Cardiovascular;  Laterality: N/A;  . RIB PLATING N/A 05/25/2016   Procedure: STERNAL PLATING;  Surgeon: Melrose Nakayama, MD;  Location: [place];  Service: Thoracic;  Laterality: N/A;  . right shoulder    . STERNAL WIRES REMOVAL N/A 03/30/2016   Procedure: STERNAL WIRES REMOVAL;  Surgeon: Melrose Nakayama, MD;  Location: [facility] OR;  Service: Thoracic;  Laterality: N/A;  . TONSILLECTOMY      Current Outpatient Medications  Medication Sig Dispense Refill  . aspirin EC 81 MG EC tablet Take 1 tablet (81 mg total) by mouth daily.    Marland Kitchen atorvastatin (LIPITOR) 80 MG tablet Take 1 tablet (80 mg total) by mouth daily at 6 PM. 30 tablet 0  . nitroGLYCERIN (NITROSTAT) 0.4 MG SL tablet Place 1 tablet (0.4 mg total) under the tongue every 5 (five) minutes as needed for chest pain. 25 tablet 6  . ranolazine (RANEXA) 1000 MG SR tablet Take 1 tablet (1,000 mg total) by mouth 2 (two) times daily. 180 tablet 3  . rivaroxaban (XARELTO) 20 MG TABS tablet Take 1 tablet (20 mg total) by mouth daily. 90 tablet 3  . ezetimibe (ZETIA) 10 MG tablet Take 1 tablet (10 mg total) by mouth daily. 90 tablet 3   No current facility-administered medications for this visit.      Allergies:   Contrast media [iodinated diagnostic agents]; Metrizamide; Oxycodone hcl; and Vicodin [hydrocodone-acetaminophen]   Social History:  The patient  reports that he has quit smoking. His  smoking use included cigarettes. He started smoking about 8 years ago. He has a 35.00 pack-year smoking history. He has quit using smokeless tobacco. He reports that he does not drink alcohol or use drugs.   Family History:   family history includes Cancer in his maternal uncle; Heart attack (age of onset: 88) in his father.    Review of Systems: Review of Systems  Cardiovascular: Positive for chest pain.  Gastrointestinal: Negative.   Musculoskeletal: Negative.   Neurological: Positive for dizziness.  Psychiatric/Behavioral: Negative.   All other systems reviewed and are negative.    PHYSICAL EXAM: VS:  BP 132/68 (BP Location: Left Arm, Patient Position: Sitting, Cuff Size: Normal)   Pulse 60   Ht 5\' 10"  (1.778 m)   Wt 179 lb (81.2 kg)   BMI 25.68 kg/m  , BMI Body mass index is 25.68 kg/m.  Constitutional:  oriented to person, place, and time. No distress.  HENT:  Head: Grossly normal Eyes:  no discharge. No scleral icterus.  Neck: No JVD, no carotid bruits  Cardiovascular: Regular rate and  rhythm, no murmurs appreciated Pulmonary/Chest: Clear to auscultation bilaterally, no wheezes or rails Abdominal: Soft.  no distension.  no tenderness.  Musculoskeletal: Normal range of motion Neurological:  normal muscle tone. Coordination normal. No atrophy Skin: Skin warm and dry Psychiatric: normal affect, pleasant   Recent Labs: 08/02/2017: Magnesium 2.0 10/02/2017: ALT 23 11/09/2017: BUN 18; Creatinine, Ser 1.11; Hemoglobin 15.3; Platelets 197; Potassium 4.0; Sodium 140    Lipid Panel Lab Results  Component Value Date   CHOL 128 10/03/2017   HDL 46 10/03/2017   LDLCALC 68 10/03/2017   TRIG 70 10/03/2017      Wt Readings from Last 3 Encounters:  12/07/17 179 lb (81.2 kg)  11/09/17 175 lb (79.4 kg)  11/02/17 175 lb (79.4 kg)     ASSESSMENT AND PLAN:  Chest pain, unspecified chest pain type -  Long history of chest pain Frequent cardiac catheterizations, stress  testing Last stress test July 2019 with no ischemia Medical management recommended for recurrent pain symptoms He is concerned about now using his sublingual nitro Suspect this is secondary to decreased doses of ranexa and holding isosorbide He has not noticed any difference in his balance or dizziness by decreasing ranexa. We recommended he go back to 1000 mg twice daily Long history of anxiety, panic disorder Nitro sublingual for chest pain  Coronary artery disease involving native coronary artery of native heart  Long history of numerous stress test and catheterizations for recurrent symptoms  Previously seen by psychiatry and neurology for anxiety We will not add isosorbide given he is orthostatic in the office  Hyperlipidemia Cholesterol is at goal on the current lipid regimen. No changes to the medications were made.  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  Very difficult case given his chronic chest pain/anginal symptoms encounter time more than 45 minutes  Greater than 50% was spent in counseling and coordination of care with the patient  Disposition:   F/U  6 month   Orders Placed This Encounter  Procedures  . EKG 12-Lead     Signed, Esmond Plants, M.D., Ph.D. 12/07/2017  [place], [place]

## 2017-12-07 ENCOUNTER — Ambulatory Visit: Payer: Medicare HMO | Admitting: Cardiovascular Disease

## 2017-12-07 ENCOUNTER — Encounter: Payer: Self-pay | Admitting: Cardiovascular Disease

## 2017-12-07 VITALS — BP 132/68 | HR 60 | Ht 70.0 in | Wt 179.0 lb

## 2017-12-07 DIAGNOSIS — I25709 Atherosclerosis of coronary artery bypass graft(s), unspecified, with unspecified angina pectoris: Secondary | ICD-10-CM | POA: Diagnosis not present

## 2017-12-07 DIAGNOSIS — I951 Orthostatic hypotension: Secondary | ICD-10-CM | POA: Diagnosis not present

## 2017-12-07 DIAGNOSIS — I480 Paroxysmal atrial fibrillation: Secondary | ICD-10-CM

## 2017-12-07 DIAGNOSIS — I2699 Other pulmonary embolism without acute cor pulmonale: Secondary | ICD-10-CM

## 2017-12-07 DIAGNOSIS — E782 Mixed hyperlipidemia: Secondary | ICD-10-CM

## 2017-12-07 DIAGNOSIS — I779 Disorder of arteries and arterioles, unspecified: Secondary | ICD-10-CM

## 2017-12-07 DIAGNOSIS — R42 Dizziness and giddiness: Secondary | ICD-10-CM | POA: Diagnosis not present

## 2017-12-07 DIAGNOSIS — I739 Peripheral vascular disease, unspecified: Secondary | ICD-10-CM | POA: Diagnosis not present

## 2017-12-07 MED ORDER — NITROGLYCERIN 0.4 MG SL SUBL: 0.4000 mg | SUBLINGUAL_TABLET | SUBLINGUAL | 6 refills | Status: DC | PRN

## 2017-12-07 MED ORDER — RANOLAZINE ER 1000 MG PO TB12: 1000.0000 mg | ORAL_TABLET | Freq: Two times a day (BID) | ORAL | 3 refills | Status: DC

## 2017-12-07 MED ORDER — EZETIMIBE 10 MG PO TABS: 10.0000 mg | ORAL_TABLET | Freq: Every day | ORAL | 3 refills | Status: DC

## 2017-12-07 NOTE — Patient Instructions (Signed)
Medication Instructions:   Please increase the ranexa up to 1000 mg twice a day for anginal pain  Please add zetia 10 mg once a day for cholesterol  If you need a refill on your cardiac medications before your next appointment, please call your pharmacy.    Lab work: No new labs needed   If you have labs (blood work) drawn today and your tests are completely normal, you will receive your results only by: Marland Kitchen MyChart Message (if you have MyChart) OR . A paper copy in the mail If you have any lab test that is abnormal or we need to change your treatment, we will call you to review the results.   Testing/Procedures: No new testing needed   Follow-Up: At [facility], you and your health needs are our priority.  As part of our continuing mission to provide you with exceptional heart care, we have created designated Provider Care Teams.  These Care Teams include your primary Cardiologist (physician) and Advanced Practice Providers (APPs -  Physician Assistants and Nurse Practitioners) who all work together to provide you with the care you need, when you need it.  . You will need a follow up appointment in 6 months .   Please call our office 2 months in advance to schedule this appointment.    . Providers on your designated Care Team:   . Murray Hodgkins, NP . Christell Faith, PA-C . Marrianne Mood, PA-C  Any Other Special Instructions Will Be Listed Below (If Applicable).  For educational health videos Log in to : [email or web address] Or : [email or web address], password : [email or web address]]

## 2018-01-03 DIAGNOSIS — K219 Gastro-esophageal reflux disease without esophagitis: Secondary | ICD-10-CM | POA: Diagnosis not present

## 2018-01-03 DIAGNOSIS — R490 Dysphonia: Secondary | ICD-10-CM | POA: Diagnosis not present

## 2018-01-04 DIAGNOSIS — R310 Gross hematuria: Secondary | ICD-10-CM | POA: Diagnosis not present

## 2018-01-09 DIAGNOSIS — Z1283 Encounter for screening for malignant neoplasm of skin: Secondary | ICD-10-CM | POA: Diagnosis not present

## 2018-01-09 DIAGNOSIS — L578 Other skin changes due to chronic exposure to nonionizing radiation: Secondary | ICD-10-CM | POA: Diagnosis not present

## 2018-01-09 DIAGNOSIS — Z872 Personal history of diseases of the skin and subcutaneous tissue: Secondary | ICD-10-CM | POA: Diagnosis not present

## 2018-02-01 ENCOUNTER — Other Ambulatory Visit: Payer: Self-pay | Admitting: Internal Medicine

## 2018-02-01 ENCOUNTER — Ambulatory Visit
Admission: RE | Admit: 2018-02-01 | Discharge: 2018-02-01 | Disposition: A | Discharge status: Home / Self Care | Payer: Medicare HMO | Source: Ambulatory Visit | Attending: Internal Medicine | Admitting: Internal Medicine

## 2018-02-01 ENCOUNTER — Other Ambulatory Visit (HOSPITAL_COMMUNITY): Payer: Self-pay | Admitting: Internal Medicine

## 2018-02-01 DIAGNOSIS — R42 Dizziness and giddiness: Secondary | ICD-10-CM | POA: Diagnosis not present

## 2018-02-17 NOTE — Progress Notes (Signed)
02/18/2018 2:55 PM   Scott [name] 1940/08/20 [id number]  Referring provider: Baxter Hire, MD [place] [place], [place] [address]  Chief Complaint  Patient presents with  . Hematuria    new patient    HPI: Scott [name] is a 78 y.o. male with gross hematuria who presents today to establish urologic care.  He is currently taking asprin and rivaroxaban.  Patient reports today that it has been going on for 6 weeks and has been particularly bad the past 2 weeks.  Describes his urine as dark yellow with "something moving inside"; it has been at times red and pink.  Patient denies blood clots, urinary problems, and pain.  Being active makes it worse, improves with inactivity.  PCP tested to see if rivaroxaban might be causing it, and took him off of it; symptoms remained unchanged.  PMH: Past Medical History:  Diagnosis Date  . Arthritis    right hip, since a fall  . CAD (coronary artery disease)    a. 04/2012 Cath: 3VD->Med Rx;  b. 04/2013 PCI RCA (2 DES); c. 03/2014 PCI: LAD 80 (3.0x23 Xience [place] DES); d. 06/2014 CABG x 2 ([facility]) LIMA->LAD, VG->OM; e. 12/2014 Cath ([facility]): patent grafts->Med Rx; f. 08/2015 Cath: patent grafts; g. 11/2016 Cath: LM min irregs, LAD 20p, 17m, LCX 100p/m, RCA 20ost, 10p/m/d, VG->OM1 nl, LIMA->dLAD nl; f. 07/2017 MV: No isch, EF 51%.  . Cardiomyopathy, ischemic    a. 04/2012 Echo: EF 40-45%;  b. 08/2013 Echo: EF 45-50%; c. 01/2015 Echo: EF 40-45%; d. 07/2016 Echo: EF 60-65%; e. 09/2017 Echo: EF 55-60%, Gr1 DD.  . Carotid arterial disease ([place])    a. 05/2013 Carotid U/S; bilat 40-50% ICA stenosis.  . Chronic Chest Pain   . Chronic systolic CHF (congestive heart failure) ([place])    a. 01/2015 Echo: EF 40-45%; b. 07/2016 Echo: EF 60-65%; c. 09/2017 Echo: EF 55-60%, Gr1 DD, nl RV fxn.  . Cough   . Diverticulosis   . Headache 1970's   migraine  . History of blood transfusion 2016   post op  . Hyperlipidemia   . Hypertension   . Iron deficiency anemia due  to chronic blood loss 09/11/2016  . Myocardial infarction [facility])    unsure of when  . Pulmonary emboli ([place]) 07/2016   a. On Xarelto.  . Reflux esophagitis   . Sternal pain    a. 03/2016 s/p Sternal wire removal; b. 05/2016 s/p redo median sternotomy for sternal debridement and sternal plating.  . Tubular adenoma of colon     Surgical History: Past Surgical History:  Procedure Laterality Date  . CARDIAC CATHETERIZATION  05/01/2012  . CARDIAC CATHETERIZATION  04/2013   [facility];x3 stent  . CARDIAC CATHETERIZATION  01/11/15    [facility]  . CARDIAC CATHETERIZATION N/A 09/16/2015   Procedure: LEFT HEART CATH AND CORS/GRAFTS ANGIOGRAPHY;  Surgeon: Minna Merritts, MD;  Location: [place] [facility];  Service: Cardiovascular;  Laterality: N/A;  . CARPAL TUNNEL RELEASE     right hand  . CATARACT EXTRACTION     LEFT  . CATARACT EXTRACTION W/PHACO Left 09/16/2014   Procedure: CATARACT EXTRACTION PHACO AND INTRAOCULAR LENS PLACEMENT (IOC);  Surgeon: Leandrew Koyanagi, MD;  Location: [place];  Service: Ophthalmology;  Laterality: Left;  . COLONOSCOPY    . COLONOSCOPY WITH PROPOFOL N/A 01/28/2016   Procedure: COLONOSCOPY WITH PROPOFOL;  Surgeon: Lollie Sails, MD;  Location: [facility];  Service: Endoscopy;  Laterality: N/A;  . CORONARY  ANGIOPLASTY WITH STENT PLACEMENT  04/13/2014  . CORONARY ARTERY BYPASS GRAFT  06-26-14   x3 bypasses  . DE QUERVAIN'S RELEASE Left 08/22/2012  . ESOPHAGOGASTRODUODENOSCOPY (EGD) WITH PROPOFOL N/A 04/26/2015   Procedure: ESOPHAGOGASTRODUODENOSCOPY (EGD) WITH PROPOFOL;  Surgeon: Hulen Luster, MD;  Location[facility];  Service: Gastroenterology;  Laterality: N/A;  . ESOPHAGOGASTRODUODENOSCOPY (EGD) WITH PROPOFOL N/A 05/29/2017   Procedure: ESOPHAGOGASTRODUODENOSCOPY (EGD) WITH PROPOFOL;  Surgeon: Lollie Sails, MD;  Location: [facility] END[facility]COPY;  Service: Endoscopy;  Laterality: N/A;  . LEFT HEART CATH AND CORONARY ANGIOGRAPHY N/A 12/04/2016   Procedure: LEFT  HEART CATH AND CORS/GRAFTS  ANGIOGRAPHY;  Surgeon: Wellington Hampshire, MD;  Location: [place] [facility];  Service: Cardiovascular;  Laterality: N/A;  . RIB PLATING N/A 05/25/2016   Procedure: STERNAL PLATING;  Surgeon: Melrose Nakayama, MD;  Location: [facility];  Service: Thoracic;  Laterality: N/A;  . right shoulder    . STERNAL WIRES REMOVAL N/A 03/30/2016   Procedure: STERNAL WIRES REMOVAL;  Surgeon: Melrose Nakayama, MD;  Location: [place];  Service: Thoracic;  Laterality: N/A;  . TONSILLECTOMY      Home Medications:  Allergies as of 02/18/2018      Reactions   Contrast Media [iodinated Diagnostic Agents] Hives   Metrizamide    Oxycodone Hcl Itching   Vicodin [hydrocodone-acetaminophen] Itching      Medication List       Accurate as of February 18, 2018  2:55 PM. Always use your most recent med list.        aspirin 81 MG EC tablet Take 1 tablet (81 mg total) by mouth daily.   atorvastatin 80 MG tablet Commonly known as:  LIPITOR Take 1 tablet (80 mg total) by mouth daily at 6 PM.   ezetimibe 10 MG tablet Commonly known as:  ZETIA Take 1 tablet (10 mg total) by mouth daily.   meclizine 25 MG tablet Commonly known as:  ANTIVERT   nitroGLYCERIN 0.4 MG SL tablet Commonly known as:  NITROSTAT Place 1 tablet (0.4 mg total) under the tongue every 5 (five) minutes as needed for chest pain.   ranolazine 1000 MG SR tablet Commonly known as:  RANEXA Take 1 tablet (1,000 mg total) by mouth 2 (two) times daily.   rivaroxaban 20 MG Tabs tablet Commonly known as:  XARELTO Take 1 tablet (20 mg total) by mouth daily.       Allergies:  Allergies  Allergen Reactions  . Contrast Media [Iodinated Diagnostic Agents] Hives  . Metrizamide   . Oxycodone Hcl Itching  . Vicodin [Hydrocodone-Acetaminophen] Itching    Family History: Family History  Problem Relation Age of Onset  . Heart attack Father 61       MI  . Cancer Maternal Uncle     Social History:  reports that  he has quit smoking. His smoking use included cigarettes. He started smoking about 8 years ago. He has a 35.00 pack-year smoking history. He has quit using smokeless tobacco. He reports that he does not drink alcohol or use drugs.  ROS: UROLOGY Frequent Urination?: No Hard to postpone urination?: No Burning/pain with urination?: No Get up at night to urinate?: No Leakage of urine?: No Urine stream starts and stops?: No Trouble starting stream?: No Do you have to strain to urinate?: No Blood in urine?: Yes Urinary tract infection?: No Sexually transmitted disease?: No Injury to kidneys or bladder?: No Painful intercourse?: No Weak stream?: No Erection problems?: No Penile pain?: No  Gastrointestinal Nausea?: No Vomiting?: No Indigestion/heartburn?: No Diarrhea?: No Constipation?: No  Constitutional Fever: No Night sweats?: No Weight loss?: No Fatigue?: No  Skin Skin rash/lesions?: No Itching?: No  Eyes Blurred vision?: No Double vision?: No  Ears/Nose/Throat Sore throat?: No Sinus problems?: No  Hematologic/Lymphatic Swollen glands?: No Easy bruising?: Yes  Cardiovascular Leg swelling?: No Chest pain?: Yes  Respiratory Cough?: No Shortness of breath?: Yes  Endocrine Excessive thirst?: No  Musculoskeletal Back pain?: No Joint pain?: No  Neurological Headaches?: No Dizziness?: Yes  Psychologic Depression?: No Anxiety?: No  Physical Exam: BP 123/72   Pulse (!) 56   Ht 5\' 10"  (1.778 m)   Wt 181 lb (82.1 kg)   BMI 25.97 kg/m   Constitutional:  Well nourished. Alert and oriented, No acute distress. Cardiovascular: No clubbing, cyanosis, or edema. Respiratory: Normal respiratory effort, no increased work of breathing. Rectal: Patient with  normal sphincter tone. Anus and perineum without scarring or rashes. No rectal masses are appreciated. Prostate is approximately 45 grams, smooth, no nodules are appreciated. Seminal vesicles are  normal. Skin: No rashes, bruises or suspicious lesions. Neurologic: Grossly intact, no focal deficits, moving all 4 extremities. Psychiatric: Normal mood and affect.  Laboratory Data: Lab Results  Component Value Date   WBC 5.3 11/09/2017   HGB 15.3 11/09/2017   HCT 46.0 11/09/2017   MCV 97.5 11/09/2017   PLT 197 11/09/2017    Lab Results  Component Value Date   CREATININE 1.11 11/09/2017   Urinalysis: Dipstick/microscopy negative    Assessment & Plan:    1. Gross Hematuria  -We discussed potential causes of hematuria including both benign and malignant etiologies.  He had an MRI of the abdomen and CT of the abdomen in March however no pelvic imaging.   -Will proceed with a complete hematuria evaluation to include CT urogram and cystoscopy.   [facility] [address], [place], [place] [address] [phone]  I, Adele Schilder, am acting as a scribe for John Giovanni, MD.    I, Abbie Sons, MD, have reviewed all documentation for this visit. The documentation on 02/18/18 for the exam, diagnosis, procedures, and orders are all accurate and complete.

## 2018-02-18 ENCOUNTER — Encounter: Payer: Self-pay | Admitting: Urology

## 2018-02-18 ENCOUNTER — Ambulatory Visit: Payer: Medicare HMO | Admitting: Urology

## 2018-02-18 VITALS — BP 123/72 | HR 56 | Ht 70.0 in | Wt 181.0 lb

## 2018-02-18 DIAGNOSIS — R310 Gross hematuria: Secondary | ICD-10-CM

## 2018-02-18 LAB — URINALYSIS, COMPLETE
Bilirubin, UA: NEGATIVE
Glucose, UA: NEGATIVE
Ketones, UA: NEGATIVE
Leukocytes, UA: NEGATIVE
Nitrite, UA: NEGATIVE
PH UA: 5.5 (ref 5.0–7.5)
Protein, UA: NEGATIVE
RBC, UA: NEGATIVE
Specific Gravity, UA: >1.03 — ABNORMAL HIGH (ref 1.005–1.030)
Urobilinogen, Ur: 0.2 mg/dL (ref 0.2–1.0)

## 2018-02-18 LAB — MICROSCOPIC EXAMINATION: Bacteria, UA: NONE SEEN

## 2018-02-22 ENCOUNTER — Telehealth: Payer: Self-pay | Admitting: Urology

## 2018-02-22 DIAGNOSIS — R310 Gross hematuria: Secondary | ICD-10-CM

## 2018-02-22 NOTE — Telephone Encounter (Signed)
Per your last office note: "Will proceed with a complete hematuria evaluation to include CT urogram and cystoscopy".  Please place order for CT scan.

## 2018-02-25 NOTE — Telephone Encounter (Signed)
Order was entered 

## 2018-02-25 NOTE — Telephone Encounter (Signed)
Per patient's chart he is allergic to IV contrast, he will need to be premedicated for the CT scan. Can this be called into his pharmacy?  Thanks, Sharyn Lull

## 2018-02-26 MED ORDER — PREDNISONE 50 MG PO TABS: ORAL_TABLET | ORAL | 0 refills | Status: DC

## 2018-02-26 MED ORDER — RANITIDINE HCL 150 MG PO TABS: 150.0000 mg | ORAL_TABLET | Freq: Two times a day (BID) | ORAL | 0 refills | Status: DC

## 2018-02-26 MED ORDER — DIPHENHYDRAMINE HCL 50 MG PO TABS: 50.0000 mg | ORAL_TABLET | Freq: Every evening | ORAL | 0 refills | Status: DC | PRN

## 2018-02-26 NOTE — Telephone Encounter (Signed)
Judson Roch can you send in the premed for this patient please?   Thanks, Sharyn Lull

## 2018-02-26 NOTE — Telephone Encounter (Signed)
Please contact radiology to see what contrast premed radiology is currently recommending.

## 2018-02-26 NOTE — Telephone Encounter (Signed)
Premedication sent to pharm

## 2018-03-07 ENCOUNTER — Telehealth: Payer: Self-pay | Admitting: Cardiovascular Disease

## 2018-03-07 NOTE — Telephone Encounter (Signed)
Pt c/o of Chest Pain: STAT if CP now or developed within 24 hours  1. Are you having CP right now? Fine right now   2. Are you experiencing any other symptoms (ex. SOB, nausea, vomiting, sweating)? SOB  3. How long have you been experiencing CP? Started last week   4. Is your CP continuous or coming and going? Coming and going - notices the pain, it stays for a while then goes away   5. Have you taken Nitroglycerin? Yes  ?

## 2018-03-07 NOTE — Telephone Encounter (Signed)
Spoke with patient and he reports severe chest pains. His first episode was when loading lumber on Monday and he had to stop. Asked if it was a muscle strain and reviewed those signs and symptoms and he said that it was not that. He reports that this is not the same as his normal spasms and was more concerning to him. He also reports increased shortness of breath with dizziness as well. Advised that he should go to ED but he declined and just wants to see if he can get in to be seen. Scheduled him to see provider on Monday and confirmed. Reviewed signs and symptoms that he should monitor for and those which he should go to ED if they persist or worsen. He verbalized understanding of instructions, agreement with plan, and had no further questions at this time.

## 2018-03-11 ENCOUNTER — Encounter: Payer: Self-pay | Admitting: Cardiovascular Disease

## 2018-03-11 ENCOUNTER — Ambulatory Visit: Payer: Medicare HMO | Admitting: Cardiovascular Disease

## 2018-03-11 VITALS — BP 128/75 | HR 67 | Ht 70.0 in | Wt 183.5 lb

## 2018-03-11 DIAGNOSIS — R42 Dizziness and giddiness: Secondary | ICD-10-CM

## 2018-03-11 DIAGNOSIS — R079 Chest pain, unspecified: Secondary | ICD-10-CM

## 2018-03-11 DIAGNOSIS — I255 Ischemic cardiomyopathy: Secondary | ICD-10-CM

## 2018-03-11 DIAGNOSIS — I480 Paroxysmal atrial fibrillation: Secondary | ICD-10-CM

## 2018-03-11 DIAGNOSIS — I25709 Atherosclerosis of coronary artery bypass graft(s), unspecified, with unspecified angina pectoris: Secondary | ICD-10-CM | POA: Diagnosis not present

## 2018-03-11 DIAGNOSIS — I779 Disorder of arteries and arterioles, unspecified: Secondary | ICD-10-CM

## 2018-03-11 DIAGNOSIS — I739 Peripheral vascular disease, unspecified: Secondary | ICD-10-CM

## 2018-03-11 DIAGNOSIS — I2699 Other pulmonary embolism without acute cor pulmonale: Secondary | ICD-10-CM

## 2018-03-11 NOTE — Progress Notes (Signed)
Cardiology Office Note  Date:  03/11/2018   ID:  Scott [name], DOB 05/28/1940, MRN [id number]  PCP:  Scott Hire, MD   Chief Complaint  Patient presents with  . other    Sob, chest pain, elevated BP and dizziness. Meds reviewed verbally with pt.    HPI:  Scott [name] is a 78 year old gentleman with history of  COPD Chronic chest pain syndrome/anxiety/panic attacks CAD, stenting CABG 2 with LIMA to the LAD 06/2014, vein graft to the OM at [facility],  chronic chest pain not relieved with bypass surgery,  Redo median sternotomy for sternal plating  on 05/25/2016 EF 40 to 45% 01/2015 Smoker, age 27 to 11 quit 2005 Pulmonary embolism July 2018, workup through hematology Postoperative atrial fibrillation at [facility] June 2016 following CABG Last ischemic work-up stress test July 2019 who presents for follow-up of his anxiety and chronic stable angina pectoralis  INTERVAL HISTORY: The patient reports today for follow up. He is accompanied by his wife.   On Monday, 03/04/2018, he felt increased SOB and sudden chest tightness after helping to load lumbar. The patient has never felt pain to this extent and is not exactly sure if he wants to do an invasive or non invasive procedure to further investigate the cause of his symptoms. He would like to do what I recommend. Associated symptoms include SOB with exertion and dizziness. 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 Since 03/04/2018, he has had similar episodes, stating they are getting worse. He has not gone to the ED, because each episode seems to resolve after about 15 minutes. He only took NTG for one or two of the episodes. Yesterday, 03/10/2018, he experienced another episode and reports his blood pressure was the highest he has ever seen it.   He has not been taking nitroglycerin for chest pain Continues to have chronic dizzy spells     HR BP SYMPTOMS  Lying 67  146/69   Sitting 57  115/68 dizzy  Standing (37min) 60 122/70 dizzy  Standing (65min) 59 121/73     EKG personally reviewed by myself on todays visit Shows normal sinus rhythm with sinus arrhythmia. 67 bpm. LAD  OTHER PAST MEDICAL HISTORY REVIEWED BY ME FOR TODAY'S VISIT:  emergency room 08/02/2017 for chest pain Troponin negative, EKG unchanged Discharged home with outpatient stress test  Had stress test July 2019 in follow-up showing no ischemia Fixed defect basal mid inferior and inferolateral wall consistent with previous scar Ejection fraction 51%  Chest pain typically relieved with nitroglycerin Continues to have periodic chest pain as he has for many years Periodic shortness of breath  Notes reviewed for history as below ER visits Chest pain shortness of breath 03/20/17 Dizziness 01/26/2017 Chest pain 01/22/2017 Mental status change/anxiety  11/19/2016 Chest pain  11/07/2016 Chest pain  09/11/2016 SOB  08/04/2016 Chest pain 02/12/2016  Cath  12/04/2016, medical management recommended Cath 09/16/2015 Cath 01/11/2015  bypass surgery June 2016 at [facility]  Cath 05/29/2014, 50% LAD lesion, distal RCA disease Cath 04/13/2014, DES to the mid LAD Cath 05/16/2013,  stent to the RCA  Stress test July 2019 no ischemia Stress test 11/08/2016 no ischemia Stress test 06/15/2015 Stress test 04/21/2015 Stress test 04/08/2014 Stress test 07/29/2012 Stress test 04/22/2012  March 2019 Numerous imaging studies of his abdomen, ultrasound, MRCP, All appeared essentially benign  hospital November 19, 2016  Had  involuntary leg movements, Described as myoclonus and mental status changes  CT scan of the head, was normal MRI performed, seen by neurology Showed sinusitis on MR  Felt to have possible panic attacks EEG unremarkable Zoloft was increased He was told not to drive by neurology for 1 month given acute onset of anxiety attack  Referred from pain clinic  in [place] to CT surg for consult Bone scan:diffuse increased uptake in the manubrium and upper sternum sternal wire removal on 03/30/2016. Redo median sternotomy for sternal plating by Dr. Roxan Hockey on 05/25/2016. lung densely adherent to the underside of the sternum and there was a small laceration of the left upper lobe  history of CAD dating back to April 2014 at which time he was found to have severe distal RCA disease with left to right collaterals, a subtotally occluded left circumflex, and moderate, diffuse LAD disease. He was initially medically managed.   In April 2015, he underwent repeat catheterization secondary to ongoing chest pain, showing stable moderate to severe disease. He then underwent successful RCA stenting.   In March 2016, due to recurrent chest pain, he underwent successful stenting of the LAD.    he had recurrent chest pain again in June 2016 and was seen at [facility].  He underwent CABG 2 with a LIMA to the LAD and a vein graft to the obtuse marginal.   following CABG, he continued to have persistent/chronic low-level left-sided and midsternal chest pain.  He had repeat hospitalizations  with stress testing in November at [facility], which was negative,  catheterization at [facility] in December 2016, which apparently showed patent grafts.  Echocardiogram 03/02/2014 showing normal LV function, essentially normal study  Echocardiogram 04/22/2012 showing ejection fraction 40%, no mention of wall motion abnormality, mild MR, TR  PMH:   has a past medical history of Arthritis, CAD (coronary artery disease), Cardiomyopathy, ischemic, Carotid arterial disease ([place]), Chronic Chest Pain, Chronic systolic CHF (congestive heart failure) ([place]), Cough, Diverticulosis, Headache (1970's), History of blood transfusion (2016), Hyperlipidemia, Hypertension, Iron deficiency anemia due to chronic blood loss (09/11/2016), Myocardial infarction [facility] Pulmonary emboli ([place]) (07/2016), Reflux  esophagitis, Sternal pain, and Tubular adenoma of colon.  PSH:    Past Surgical History:  Procedure Laterality Date  . CARDIAC CATHETERIZATION  05/01/2012  . CARDIAC CATHETERIZATION  04/2013   [facility];x3 stent  . CARDIAC CATHETERIZATION  01/11/15    [facility]  . CARDIAC CATHETERIZATION N/A 09/16/2015   Procedure: LEFT HEART CATH AND CORS/GRAFTS ANGIOGRAPHY;  Surgeon: Minna Merritts, MD;  Location: [place] CV LAB;  Service: Cardiovascular;  Laterality: N/A;  . CARPAL TUNNEL RELEASE     right hand  . CATARACT EXTRACTION     LEFT  . CATARACT EXTRACTION W/PHACO Left 09/16/2014   Procedure: CATARACT EXTRACTION PHACO AND INTRAOCULAR LENS PLACEMENT (IOC);  Surgeon: Leandrew Koyanagi, MD;  Location: [place];  Service: Ophthalmology;  Laterality: Left;  . COLONOSCOPY    . COLONOSCOPY WITH PROPOFOL N/A 01/28/2016   Procedure: COLONOSCOPY WITH PROPOFOL;  Surgeon: Lollie Sails, MD;  Location: [facility] ENDOSCOPY;  Service: Endoscopy;  Laterality: N/A;  . CORONARY ANGIOPLASTY WITH STENT PLACEMENT  04/13/2014  . CORONARY ARTERY BYPASS GRAFT  06-26-14   x3 bypasses  . DE QUERVAIN'S RELEASE Left 08/22/2012  . ESOPHAGOGASTRODUODENOSCOPY (EGD) WITH PROPOFOL N/A 04/26/2015   Procedure: ESOPHAGOGASTRODUODENOSCOPY (EGD) WITH PROPOFOL;  Surgeon: Hulen Luster, MD;  Location: [facility] ENDOSCOPY;  Service: Gastroenterology;  Laterality: N/A;  . ESOPHAGOGASTRODUODENOSCOPY (EGD) WITH PROPOFOL N/A 05/29/2017  Procedure: ESOPHAGOGASTRODUODENOSCOPY (EGD) WITH PROPOFOL;  Surgeon: Lollie Sails, MD;  Location: [facility] ENDOSCOPY;  Service: Endoscopy;  Laterality: N/A;  . LEFT HEART CATH AND CORONARY ANGIOGRAPHY N/A 12/04/2016   Procedure: LEFT HEART CATH AND CORS/GRAFTS  ANGIOGRAPHY;  Surgeon: Wellington Hampshire, MD;  Location: [place] CV LAB;  Service: Cardiovascular;  Laterality: N/A;  . RIB PLATING N/A 05/25/2016   Procedure: STERNAL PLATING;  Surgeon: Melrose Nakayama, MD;  Location: [place];  Service: Thoracic;   Laterality: N/A;  . right shoulder    . STERNAL WIRES REMOVAL N/A 03/30/2016   Procedure: STERNAL WIRES REMOVAL;  Surgeon: Melrose Nakayama, MD;  Location: [facility] OR;  Service: Thoracic;  Laterality: N/A;  . TONSILLECTOMY      Current Outpatient Medications  Medication Sig Dispense Refill  . atorvastatin (LIPITOR) 80 MG tablet Take 1 tablet (80 mg total) by mouth daily at 6 PM. 30 tablet 0  . ezetimibe (ZETIA) 10 MG tablet Take 1 tablet (10 mg total) by mouth daily. 90 tablet 3  . nitroGLYCERIN (NITROSTAT) 0.4 MG SL tablet Place 1 tablet (0.4 mg total) under the tongue every 5 (five) minutes as needed for chest pain. 25 tablet 6  . ranolazine (RANEXA) 1000 MG SR tablet Take 1 tablet (1,000 mg total) by mouth 2 (two) times daily. 180 tablet 3  . rivaroxaban (XARELTO) 20 MG TABS tablet Take 1 tablet (20 mg total) by mouth daily. 90 tablet 3   No current facility-administered medications for this visit.      Allergies:   Contrast media [iodinated diagnostic agents]; Metrizamide; Oxycodone hcl; and Vicodin [hydrocodone-acetaminophen]   Social History:  The patient  reports that he has quit smoking. His smoking use included cigarettes. He started smoking about 8 years ago. He has a 35.00 pack-year smoking history. He has quit using smokeless tobacco. He reports that he does not drink alcohol or use drugs.   Family History:   family history includes Cancer in his maternal uncle; Heart attack (age of onset: 23) in his father.    Review of Systems: Review of Systems  Constitutional: Negative.   Eyes: Negative.   Respiratory: Positive for shortness of breath.   Cardiovascular: Positive for chest pain.  Gastrointestinal: Negative.   Genitourinary: Negative.   Musculoskeletal: Negative.   Neurological: Positive for dizziness.  Psychiatric/Behavioral: Negative.   All other systems reviewed and are negative.    PHYSICAL EXAM: VS:  BP 128/75 (BP Location: Right Arm, Patient Position:  Sitting, Cuff Size: Normal)   Pulse 67   Ht 5\' 10"  (1.778 m)   Wt 183 lb 8 oz (83.2 kg)   BMI 26.33 kg/m  , BMI Body mass index is 26.33 kg/m.   Constitutional:  oriented to person, place, and time. No distress.  HENT:  Head: Normocephalic and atraumatic.  Eyes:  no discharge. No scleral icterus.  Neck: Normal range of motion. Neck supple. No JVD present.  Cardiovascular: Normal rate, regular rhythm, normal heart sounds and intact distal pulses. Exam reveals no gallop and no friction rub. No edema No murmur heard. Pulmonary/Chest: [place] normal and breath sounds normal. No stridor. No respiratory distress.  no wheezes.  no rales.  no tenderness.  Abdominal: Soft.  no distension.  no tenderness.  Musculoskeletal: Normal range of motion.  no  tenderness or deformity.  Neurological:  normal muscle tone. Coordination normal. No atrophy Skin: Skin is warm and dry. No rash noted. not diaphoretic.  Psychiatric:  normal mood and affect.  behavior is normal. Thought content normal.   Recent Labs: 08/02/2017: Magnesium 2.0 10/02/2017: ALT 23 11/09/2017: BUN 18; Creatinine, Ser 1.11; Hemoglobin 15.3; Platelets 197; Potassium 4.0; Sodium 140    Lipid Panel Lab Results  Component Value Date   CHOL 128 10/03/2017   HDL 46 10/03/2017   LDLCALC 68 10/03/2017   TRIG 70 10/03/2017      Wt Readings from Last 3 Encounters:  03/11/18 183 lb 8 oz (83.2 kg)  02/18/18 181 lb (82.1 kg)  12/07/17 179 lb (81.2 kg)     ASSESSMENT AND PLAN:  Cad with unstable angina Angina symptoms over the last week Long history of chest pain Frequent cardiac catheterizations, stress testing Last stress test July 2019 with no ischemia Long discussion with him concerning his various treatment options cardiac catheterization versus noninvasive testing He was very noncommittal and after further discussion he appears to prefer stress testing treadmill Myoview will be ordered at his request  Dizziness We will  schedule a carotid u/s for known stenosis, dizziness Chronic issue dating back several years Mild drop in blood pressure but not significant  Anxiety Long history of anxiety, panic disorder Exacerbating chest pain Previously seen by psychiatry and neurology for anxiety  Hyperlipidemia Plan: Cholesterol is at goal on the current lipid regimen. No changes to the medications were made.  Stable  Shortness of breath Plan: SOB on excertion 45 years of smoking, likely with COPD Albuterol for chest tightness Also deconditioned, used to do regular walking program but does not do any regular exercise  Pulmonary embolism Plan: on Xarelto, recent CT scan with no PE/has resolved  Postop atrial fibrillation Plan:  Noted back in June 2016 on Xarelto 20 daily Maintaining normal sinus rhythm  Long discussion with patient and family concerning management of anginal symptoms, Etiology of his dizziness.  Total encounter time more than 25 minutes. Greater than 50% was spent in counseling and coordination of care with the patient.  Disposition:   F/U  6 month   Orders Placed This Encounter  Procedures  . EKG 12-Lead    I, Margit Banda am acting as a scribe for [name], M.D., Ph.D.  I have reviewed the above documentation for accuracy and completeness, and I agree with the above.   Signed, Esmond Plants, M.D., Ph.D. 03/11/2018  [place], [place]

## 2018-03-11 NOTE — Patient Instructions (Addendum)
Medication Instructions:  No changes  If you need a refill on your cardiac medications before your next appointment, please call your pharmacy.    Lab work: No new labs needed   If you have labs (blood work) drawn today and your tests are completely normal, you will receive your results only by: Marland Kitchen MyChart Message (if you have MyChart) OR . A paper copy in the mail If you have any lab test that is abnormal or we need to change your treatment, we will call you to review the results.   Testing/Procedures: We will schedule a treadmill myoview for stable angina pain  [employer] caregiver has ordered a Stress Test with nuclear imaging. The purpose of this test is to evaluate the blood supply to your heart muscle. This procedure is referred to as a "Non-Invasive Stress Test." This is because other than having an IV started in your vein, nothing is inserted or "invades" your body. Cardiac stress tests are done to find areas of poor blood flow to the heart by determining the extent of coronary artery disease (CAD). Some patients exercise on a treadmill, which naturally increases the blood flow to your heart, while others who are  unable to walk on a treadmill due to physical limitations have a pharmacologic/chemical stress agent called Lexiscan . This medicine will mimic walking on a treadmill by temporarily increasing your coronary blood flow.   Please note: these test may take anywhere between 2-4 hours to complete  PLEASE REPORT TO [place] ON THE RIGHT TO REGISTER    Date of Procedure: Friday 03/15/2018  Arrival Time for Procedure: 7:15 am  Instructions regarding medication:   _x___ : Dennis Bast may take all of your regular medications the morning of your procedure with enough water to get them down safely   PLEASE NOTIFY THE OFFICE AT LEAST 24 HOURS IN ADVANCE IF YOU ARE UNABLE TO [place].  [phone] AND  PLEASE NOTIFY NUCLEAR MEDICINE  AT [facility] AT LEAST 24 HOURS IN ADVANCE IF YOU ARE UNABLE TO KEEP YOUR APPOINTMENT. [phone]  How to prepare for your Myoview test:  1. Do not eat or drink after midnight 2. No caffeine for 24 hours prior to test 3. No smoking 24 hours prior to test. 4. Your medication may be taken with water.  If your doctor stopped a medication because of this test, do not take that medication. 5. Ladies, please do not wear dresses.  Skirts or pants are appropriate. Please wear a short sleeve shirt. 6. No perfume, cologne or lotion. 7. Wear comfortable walking shoes. No heels!   We will schedule a carotid u/s for known stenosis, dizziness Your physician has requested that you have a carotid duplex. This test is an ultrasound of the carotid arteries in your neck. It looks at blood flow through these arteries that supply the brain with blood. Allow one hour for this exam. There are no restrictions or special instructions.   Follow-Up: At [facility], you and your health needs are our priority.  As part of our continuing mission to provide you with exceptional heart care, we have created designated Provider Care Teams.  These Care Teams include your primary Cardiologist (physician) and Advanced Practice Providers (APPs -  Physician Assistants and Nurse Practitioners) who all work together to provide you with the care you need, when you need it.  . You will need a follow up appointment in 6 months .   Please  call our office 2 months in advance to schedule this appointment.    . Providers on your designated Care Team:   . Murray Hodgkins, NP . Christell Faith, PA-C . Marrianne Mood, PA-C  Any Other Special Instructions Will Be Listed Below (If Applicable).  For educational health videos Log in to : www.[email or web address] Or : [email or web address], password : [email or web address]   Cardiac Nuclear Scan A cardiac nuclear scan is a test that measures blood flow to the heart when a person is resting and when he or she is  exercising. The test looks for problems such as:  Not enough blood reaching a portion of the heart.  The heart muscle not working normally. You may need this test if:  You have heart disease.  You have had abnormal lab results.  You have had heart surgery or a balloon procedure to open up blocked arteries (angioplasty).  You have chest pain.  You have shortness of breath. In this test, a radioactive dye (tracer) is injected into your bloodstream. After the tracer has traveled to your heart, an imaging device is used to measure how much of the tracer is absorbed by or distributed to various areas of your heart. This procedure is usually done at a hospital and takes 2-4 hours. Tell a health care provider about:  Any allergies you have.  All medicines you are taking, including vitamins, herbs, eye drops, creams, and over-the-counter medicines.  Any problems you or family members have had with anesthetic medicines.  Any blood disorders you have.  Any surgeries you have had.  Any medical conditions you have.  Whether you are pregnant or may be pregnant. What are the risks? Generally, this is a safe procedure. However, problems may occur, including:  Serious chest pain and heart attack. This is only a risk if the stress portion of the test is done.  Rapid heartbeat.  Sensation of warmth in your chest. This usually passes quickly.  Allergic reaction to the tracer. What happens before the procedure?  Ask your health care provider about changing or stopping your regular medicines. This is especially important if you are taking diabetes medicines or blood thinners.  Follow instructions from your health care provider about eating or drinking restrictions.  Remove your jewelry on the day of the procedure. What happens during the procedure?  An IV will be inserted into one of your veins.  Your health care provider will inject a small amount of radioactive tracer through the  IV.  You will wait for 20-40 minutes while the tracer travels through your bloodstream.  Your heart activity will be monitored with an electrocardiogram (ECG).  You will lie down on an exam table.  Images of your heart will be taken for about 15-20 minutes.  You may also have a stress test. For this test, one of the following may be done: ? You will exercise on a treadmill or stationary bike. While you exercise, your heart's activity will be monitored with an ECG, and your blood pressure will be checked. ? You will be given medicines that will increase blood flow to parts of your heart. This is done if you are unable to exercise.  When blood flow to your heart has peaked, a tracer will again be injected through the IV.  After 20-40 minutes, you will get back on the exam table and have more images taken of your heart.  Depending on the type of tracer used, scans may need to be repeated 3-4 hours later.  Your IV line will be removed when the procedure is over. The procedure may vary among health care providers and hospitals. What happens after the procedure?  Unless your health care provider tells you otherwise, you may return to your normal schedule, including diet, activities, and medicines.  Unless your health care provider tells you otherwise, you may increase your fluid intake. This will help to flush the contrast dye from your body. Drink enough fluid to keep your urine pale yellow.  Ask your health care provider, or the department that is doing the test: ? When will my results be ready? ? How will I get my results? Summary  A cardiac nuclear scan measures the blood flow to the heart when a person is resting and when he or she is exercising.  Tell your health care provider if you are pregnant.  Before the procedure, ask your health care provider about changing or stopping your regular medicines. This is especially important if you are taking diabetes medicines or blood  thinners.  After the procedure, unless your health care provider tells you otherwise, increase your fluid intake. This will help flush the contrast dye from your body.  After the procedure, unless your health care provider tells you otherwise, you may return to your normal schedule, including diet, activities, and medicines. This information is not intended to replace advice given to you by your health care provider. Make sure you discuss any questions you have with your health care provider. Document Released: 02/04/2004 Document Revised: 06/25/2017 Document Reviewed: 06/25/2017 Elsevier Interactive Patient Education  2019 [employer].

## 2018-03-14 ENCOUNTER — Ambulatory Visit
Admission: RE | Admit: 2018-03-14 | Discharge: 2018-03-14 | Disposition: A | Discharge status: Home / Self Care | Payer: Medicare HMO | Source: Ambulatory Visit | Attending: Urology | Admitting: Urology

## 2018-03-14 DIAGNOSIS — R310 Gross hematuria: Secondary | ICD-10-CM | POA: Diagnosis not present

## 2018-03-14 LAB — POCT I-STAT CREATININE: Creatinine, Ser: 1.2 mg/dL (ref 0.61–1.24)

## 2018-03-14 MED ORDER — IOHEXOL 300 MG/ML  SOLN
125.0000 mL | Freq: Once | INTRAMUSCULAR | Status: AC | PRN
  Administered 2018-03-14: 125 mL via INTRAVENOUS

## 2018-03-15 ENCOUNTER — Encounter
Admission: RE | Admit: 2018-03-15 | Discharge: 2018-03-15 | Disposition: A | Discharge status: Home / Self Care | Payer: Medicare HMO | Source: Ambulatory Visit | Attending: Cardiovascular Disease | Admitting: Cardiovascular Disease

## 2018-03-15 DIAGNOSIS — R079 Chest pain, unspecified: Secondary | ICD-10-CM | POA: Insufficient documentation

## 2018-03-15 MED ORDER — REGADENOSON 0.4 MG/5ML IV SOLN
0.4000 mg | Freq: Once | INTRAVENOUS | Status: AC
  Administered 2018-03-15: 0.4 mg via INTRAVENOUS

## 2018-03-15 MED ORDER — TECHNETIUM TC 99M TETROFOSMIN IV KIT
31.2700 | PACK | Freq: Once | INTRAVENOUS | Status: AC | PRN
  Administered 2018-03-15: 31.27 via INTRAVENOUS

## 2018-03-15 MED ORDER — TECHNETIUM TC 99M TETROFOSMIN IV KIT
10.0000 | PACK | Freq: Once | INTRAVENOUS | Status: AC | PRN
  Administered 2018-03-15: 10.3 via INTRAVENOUS

## 2018-03-16 LAB — NM MYOCAR MULTI W/SPECT W/WALL MOTION / EF
LV dias vol: 138 mL (ref 62–150)
LV sys vol: 65 mL
NUC STRESS TID: 1.2
Peak HR: 80 {beats}/min
Percent HR: 55 %
Rest HR: 65 {beats}/min
SDS: 1
SRS: 12
SSS: 9

## 2018-03-18 ENCOUNTER — Telehealth: Payer: Self-pay | Admitting: *Deleted

## 2018-03-18 NOTE — Telephone Encounter (Signed)
Reviewed results and recommendations with patient and he verbalized understanding. He reports that chest pain yesterday was persistent and that he took 4 nitroglycerins with no relief. He would like to know what he can do to help with this pain. Asked if he had tried any over the counter medications for inflammation such as ibuprofen and he states that it doesn't help. Advised that I would make provider aware and call him back if any further recommendations. He verbalized understanding with no further questions at this time.

## 2018-03-18 NOTE — Telephone Encounter (Signed)
-----   Message from Minna Merritts, MD sent at 03/17/2018  1:31 PM EST ----- Stress test, No ischemia GI uptake artifact, making it difficult to determine EF/cardiac function but previously seen on echo and was stable No indication for cardiac cath at this time

## 2018-03-19 NOTE — Telephone Encounter (Signed)
Would he like to try isosorbide mononitrate 30 mg daily Long-acting nitro Also could try a pain pill

## 2018-03-20 NOTE — Telephone Encounter (Signed)
Spoke with patient and reviewed provider recommendations for trying isosorbide mononitrate. He reports that he has tried that in the past and it did not help and he declined trying this again at this time. He states that his pain has not been bad this week. Reviewed signs and symptoms to monitor for with instructions to call back if his pain persists or worsens. He verbalized understanding of our conversation and had no further questions at this time.

## 2018-03-22 ENCOUNTER — Other Ambulatory Visit: Payer: Self-pay | Admitting: Urology

## 2018-03-22 ENCOUNTER — Ambulatory Visit: Payer: Medicare HMO | Admitting: Urology

## 2018-03-22 ENCOUNTER — Encounter: Payer: Self-pay | Admitting: Urology

## 2018-03-22 VITALS — BP 121/74 | HR 61 | Ht 70.0 in | Wt 181.7 lb

## 2018-03-22 DIAGNOSIS — R310 Gross hematuria: Secondary | ICD-10-CM

## 2018-03-22 LAB — URINALYSIS, COMPLETE
BILIRUBIN UA: NEGATIVE
Glucose, UA: NEGATIVE
Ketones, UA: NEGATIVE
Leukocytes, UA: NEGATIVE
Nitrite, UA: NEGATIVE
Protein, UA: NEGATIVE
RBC, UA: NEGATIVE
SPEC GRAV UA: 1.025 (ref 1.005–1.030)
Urobilinogen, Ur: 0.2 mg/dL (ref 0.2–1.0)
pH, UA: 5 (ref 5.0–7.5)

## 2018-03-22 MED ORDER — LIDOCAINE HCL URETHRAL/MUCOSAL 2 % EX GEL: 1.0000 "application " | Freq: Once | CUTANEOUS | Status: DC

## 2018-03-22 NOTE — Progress Notes (Signed)
   03/22/18  CC:  Chief Complaint  Patient presents with  . Cysto    HPI: Intermittent gross hematuria.  See office note 02/18/2018.  CTU was felt to show subtle wall thickening in the central portion of the left renal pelvis  Blood pressure 121/74, pulse 61, height 5\' 10"  (1.778 m), weight 181 lb 11.2 oz (82.4 kg). NED. A&Ox3.   No respiratory distress   Abd soft, NT, ND Normal phallus with bilateral descended testicles  Cystoscopy Procedure Note  Patient identification was confirmed, informed consent was obtained, and patient was prepped using Betadine solution.  Lidocaine jelly was administered per urethral meatus.     Pre-Procedure: - Inspection reveals a normal caliber ureteral meatus.  Procedure: The flexible cystoscope was introduced without difficulty - No urethral strictures/lesions are present. - Moderate lateral lobe enlargement prostate; prominent hypervascularity - Moderate elevation bladder neck - Bilateral ureteral orifices identified - Bladder mucosa  reveals no ulcers, tumors, or lesions - No bladder stones -Mild trabeculation  Retroflexion shows no intravesical median lobe   Post-Procedure: - Patient tolerated the procedure well  Assessment/ Plan: 78 year old male with intermittent gross hematuria.  No significant abnormalities on cystoscopy and I suspect his hematuria is from the prostate.  A urine cytology was sent.  CTU was reviewed with with my partners and no obvious filling defect is seen.  Feel ureteroscopy would be low yield for this finding.  If his urine cytology is negative would recommend a follow-up CTU in 3-4 months.   Abbie Sons, MD

## 2018-03-27 ENCOUNTER — Other Ambulatory Visit: Payer: Self-pay | Admitting: Urology

## 2018-03-27 ENCOUNTER — Telehealth: Payer: Self-pay | Admitting: Urology

## 2018-03-27 DIAGNOSIS — R310 Gross hematuria: Secondary | ICD-10-CM

## 2018-03-27 NOTE — Telephone Encounter (Signed)
Patient called stating that the day of his cysto his was not given any information on the cysto results or his CT results? He would like a call back today regarding these, he states he is still passes blood and wants to know what is going on with his results.   Sharyn Lull

## 2018-03-28 ENCOUNTER — Telehealth: Payer: Self-pay | Admitting: Urology

## 2018-03-28 NOTE — Telephone Encounter (Signed)
Pt called back and states that he is passing blood every time he urinates now. Please advise.

## 2018-03-29 MED ORDER — FINASTERIDE 5 MG PO TABS: 5.0000 mg | ORAL_TABLET | Freq: Every day | ORAL | 1 refills | Status: DC

## 2018-03-29 NOTE — Telephone Encounter (Signed)
Findings were discussed as per my previous cystoscopy note.  He has elected to start finasteride for his hematuria.  Rx sent  Recommend follow-up CT and office visit in 4 months.  CT order was entered

## 2018-03-29 NOTE — Addendum Note (Signed)
Addended by: Abbie Sons on: 03/29/2018 04:02 PM   Modules accepted: Orders

## 2018-03-29 NOTE — Telephone Encounter (Signed)
Spoke with patient. He stated that Dr Bernardo Heater had just called him and sent in Rx for Finasteride. Patient states he will try medication for a few weeks and reassess symptoms at that time.

## 2018-03-29 NOTE — Telephone Encounter (Signed)
App made and mailed to patient ° °Scott [name] °

## 2018-04-09 ENCOUNTER — Other Ambulatory Visit: Payer: Self-pay

## 2018-04-09 ENCOUNTER — Encounter
Admission: RE | Disposition: A | Discharge status: Home / Self Care | Payer: Self-pay | Source: Home / Self Care | Attending: Cardiology

## 2018-04-09 ENCOUNTER — Encounter: Payer: Self-pay | Admitting: *Deleted

## 2018-04-09 ENCOUNTER — Ambulatory Visit
Admission: RE | Admit: 2018-04-09 | Discharge: 2018-04-09 | Disposition: A | Discharge status: Home / Self Care | Payer: Medicare HMO | Attending: Cardiology | Admitting: Cardiology

## 2018-04-09 DIAGNOSIS — I480 Paroxysmal atrial fibrillation: Secondary | ICD-10-CM | POA: Insufficient documentation

## 2018-04-09 DIAGNOSIS — I5032 Chronic diastolic (congestive) heart failure: Secondary | ICD-10-CM | POA: Insufficient documentation

## 2018-04-09 DIAGNOSIS — Z955 Presence of coronary angioplasty implant and graft: Secondary | ICD-10-CM | POA: Diagnosis not present

## 2018-04-09 DIAGNOSIS — E785 Hyperlipidemia, unspecified: Secondary | ICD-10-CM | POA: Insufficient documentation

## 2018-04-09 DIAGNOSIS — Z79899 Other long term (current) drug therapy: Secondary | ICD-10-CM | POA: Insufficient documentation

## 2018-04-09 DIAGNOSIS — R079 Chest pain, unspecified: Secondary | ICD-10-CM | POA: Diagnosis present

## 2018-04-09 DIAGNOSIS — I2510 Atherosclerotic heart disease of native coronary artery without angina pectoris: Secondary | ICD-10-CM | POA: Diagnosis not present

## 2018-04-09 DIAGNOSIS — Z7901 Long term (current) use of anticoagulants: Secondary | ICD-10-CM | POA: Insufficient documentation

## 2018-04-09 DIAGNOSIS — Z87891 Personal history of nicotine dependence: Secondary | ICD-10-CM | POA: Diagnosis not present

## 2018-04-09 DIAGNOSIS — R0602 Shortness of breath: Secondary | ICD-10-CM | POA: Diagnosis not present

## 2018-04-09 DIAGNOSIS — Z86711 Personal history of pulmonary embolism: Secondary | ICD-10-CM | POA: Insufficient documentation

## 2018-04-09 HISTORY — PX: LEFT HEART CATH AND CORS/GRAFTS ANGIOGRAPHY: CATH118250

## 2018-04-09 SURGERY — LEFT HEART CATH AND CORS/GRAFTS ANGIOGRAPHY
Anesthesia: Moderate Sedation

## 2018-04-09 MED ORDER — METHYLPREDNISOLONE SODIUM SUCC 125 MG IJ SOLR
INTRAMUSCULAR | Status: AC
  Administered 2018-04-09: 125 mg via INTRAVENOUS
  Filled 2018-04-09: qty 2

## 2018-04-09 MED ORDER — SODIUM CHLORIDE 0.9% FLUSH: 3.0000 mL | Freq: Two times a day (BID) | INTRAVENOUS | Status: DC

## 2018-04-09 MED ORDER — DIPHENHYDRAMINE HCL 50 MG/ML IJ SOLN
INTRAMUSCULAR | Status: AC
  Administered 2018-04-09: 50 mg via INTRAVENOUS
  Filled 2018-04-09: qty 1

## 2018-04-09 MED ORDER — SODIUM CHLORIDE 0.9 % WEIGHT BASED INFUSION
3.0000 mL/kg/h | INTRAVENOUS | Status: AC
  Administered 2018-04-09: 3 mL/kg/h via INTRAVENOUS

## 2018-04-09 MED ORDER — METHYLPREDNISOLONE SODIUM SUCC 125 MG IJ SOLR
125.0000 mg | Freq: Once | INTRAMUSCULAR | Status: AC
  Administered 2018-04-09: 125 mg via INTRAVENOUS

## 2018-04-09 MED ORDER — ASPIRIN 81 MG PO CHEW
CHEWABLE_TABLET | ORAL | Status: AC
  Administered 2018-04-09: 81 mg via ORAL
  Filled 2018-04-09: qty 1

## 2018-04-09 MED ORDER — SODIUM CHLORIDE 0.9 % WEIGHT BASED INFUSION: 1.0000 mL/kg/h | INTRAVENOUS | Status: DC

## 2018-04-09 MED ORDER — ONDANSETRON HCL 4 MG/2ML IJ SOLN: 4.0000 mg | Freq: Four times a day (QID) | INTRAMUSCULAR | Status: DC | PRN

## 2018-04-09 MED ORDER — HEPARIN (PORCINE) IN NACL 1000-0.9 UT/500ML-% IV SOLN
INTRAVENOUS | Status: AC
  Filled 2018-04-09: qty 1000

## 2018-04-09 MED ORDER — SODIUM CHLORIDE 0.9% FLUSH: 3.0000 mL | INTRAVENOUS | Status: DC | PRN

## 2018-04-09 MED ORDER — MIDAZOLAM HCL 2 MG/2ML IJ SOLN
INTRAMUSCULAR | Status: DC | PRN
  Administered 2018-04-09: 1 mg via INTRAVENOUS

## 2018-04-09 MED ORDER — SODIUM CHLORIDE 0.9 % IV SOLN: 250.0000 mL | INTRAVENOUS | Status: DC | PRN

## 2018-04-09 MED ORDER — ASPIRIN 81 MG PO CHEW
81.0000 mg | CHEWABLE_TABLET | ORAL | Status: AC
  Administered 2018-04-09: 81 mg via ORAL

## 2018-04-09 MED ORDER — FENTANYL CITRATE (PF) 100 MCG/2ML IJ SOLN
INTRAMUSCULAR | Status: DC | PRN
  Administered 2018-04-09: 25 ug via INTRAVENOUS

## 2018-04-09 MED ORDER — MIDAZOLAM HCL 2 MG/2ML IJ SOLN
INTRAMUSCULAR | Status: AC
  Filled 2018-04-09: qty 2

## 2018-04-09 MED ORDER — IOPAMIDOL (ISOVUE-300) INJECTION 61%
INTRAVENOUS | Status: DC | PRN
  Administered 2018-04-09: 150 mL via INTRA_ARTERIAL

## 2018-04-09 MED ORDER — HEPARIN (PORCINE) IN NACL 1000-0.9 UT/500ML-% IV SOLN
INTRAVENOUS | Status: DC | PRN
  Administered 2018-04-09: 500 mL

## 2018-04-09 MED ORDER — FENTANYL CITRATE (PF) 100 MCG/2ML IJ SOLN
INTRAMUSCULAR | Status: AC
  Filled 2018-04-09: qty 2

## 2018-04-09 MED ORDER — DIPHENHYDRAMINE HCL 50 MG/ML IJ SOLN
50.0000 mg | Freq: Once | INTRAMUSCULAR | Status: AC
  Administered 2018-04-09: 50 mg via INTRAVENOUS

## 2018-04-09 SURGICAL SUPPLY — 10 items
CATH INFINITI 5 FR 3DRC (CATHETERS) ×2 IMPLANT
CATH INFINITI 5FR ANG PIGTAIL (CATHETERS) ×2 IMPLANT
CATH INFINITI 5FR JL4 (CATHETERS) ×2 IMPLANT
CATH INFINITI JR4 5F (CATHETERS) ×2 IMPLANT
DEVICE CLOSURE MYNXGRIP 5F (Vascular Products) ×2 IMPLANT
KIT MANI 3VAL PERCEP (MISCELLANEOUS) ×2 IMPLANT
NEEDLE PERC 18GX7CM (NEEDLE) ×2 IMPLANT
PACK CARDIAC CATH (CUSTOM PROCEDURE TRAY) ×2 IMPLANT
SHEATH AVANTI 5FR X 11CM (SHEATH) ×2 IMPLANT
WIRE GUIDERIGHT .035X150 (WIRE) ×2 IMPLANT

## 2018-04-17 ENCOUNTER — Telehealth: Payer: Self-pay | Admitting: Urology

## 2018-04-17 NOTE — Telephone Encounter (Signed)
Pt still having hematuria despite use of finasteride, Please advise if pt needs OV or telephone call.

## 2018-04-17 NOTE — Telephone Encounter (Signed)
Patient notified and will continue the Finasteride. If he continues to experience any problems he will contact our office.

## 2018-04-17 NOTE — Telephone Encounter (Signed)
The finasteride was prescribed on 3/6.  Although sometimes improvement is seen within a few weeks it can take up to 3 months for this medication to become effective.  The only other option would be TURP however would not be able to schedule until we are given the go ahead to start performing elective surgery.  I am happy to repeat cystoscopy to make sure there is nothing else bleeding.

## 2018-04-17 NOTE — Telephone Encounter (Signed)
Pt called and states that he is still passing blood when he urinates. He states that the medicine is not working. Please advise.

## 2018-04-24 DIAGNOSIS — R55 Syncope and collapse: Secondary | ICD-10-CM

## 2018-04-24 HISTORY — DX: Syncope and collapse: R55

## 2018-05-01 ENCOUNTER — Other Ambulatory Visit: Payer: Self-pay

## 2018-05-02 ENCOUNTER — Other Ambulatory Visit: Payer: Self-pay

## 2018-05-02 ENCOUNTER — Inpatient Hospital Stay: Payer: Medicare HMO | Attending: Oncology

## 2018-05-02 DIAGNOSIS — I5022 Chronic systolic (congestive) heart failure: Secondary | ICD-10-CM | POA: Insufficient documentation

## 2018-05-02 DIAGNOSIS — Z7901 Long term (current) use of anticoagulants: Secondary | ICD-10-CM | POA: Insufficient documentation

## 2018-05-02 DIAGNOSIS — Z885 Allergy status to narcotic agent status: Secondary | ICD-10-CM | POA: Insufficient documentation

## 2018-05-02 DIAGNOSIS — E785 Hyperlipidemia, unspecified: Secondary | ICD-10-CM | POA: Insufficient documentation

## 2018-05-02 DIAGNOSIS — R310 Gross hematuria: Secondary | ICD-10-CM | POA: Insufficient documentation

## 2018-05-02 DIAGNOSIS — I255 Ischemic cardiomyopathy: Secondary | ICD-10-CM | POA: Diagnosis not present

## 2018-05-02 DIAGNOSIS — I110 Hypertensive heart disease with heart failure: Secondary | ICD-10-CM | POA: Insufficient documentation

## 2018-05-02 DIAGNOSIS — Z87891 Personal history of nicotine dependence: Secondary | ICD-10-CM | POA: Insufficient documentation

## 2018-05-02 DIAGNOSIS — Z809 Family history of malignant neoplasm, unspecified: Secondary | ICD-10-CM | POA: Insufficient documentation

## 2018-05-02 DIAGNOSIS — I25810 Atherosclerosis of coronary artery bypass graft(s) without angina pectoris: Secondary | ICD-10-CM | POA: Insufficient documentation

## 2018-05-02 DIAGNOSIS — Z955 Presence of coronary angioplasty implant and graft: Secondary | ICD-10-CM | POA: Insufficient documentation

## 2018-05-02 DIAGNOSIS — Z91041 Radiographic dye allergy status: Secondary | ICD-10-CM | POA: Insufficient documentation

## 2018-05-02 DIAGNOSIS — D500 Iron deficiency anemia secondary to blood loss (chronic): Secondary | ICD-10-CM

## 2018-05-02 DIAGNOSIS — Z951 Presence of aortocoronary bypass graft: Secondary | ICD-10-CM | POA: Insufficient documentation

## 2018-05-02 DIAGNOSIS — Z79899 Other long term (current) drug therapy: Secondary | ICD-10-CM | POA: Insufficient documentation

## 2018-05-02 DIAGNOSIS — Z8249 Family history of ischemic heart disease and other diseases of the circulatory system: Secondary | ICD-10-CM | POA: Insufficient documentation

## 2018-05-02 DIAGNOSIS — I252 Old myocardial infarction: Secondary | ICD-10-CM | POA: Diagnosis not present

## 2018-05-02 DIAGNOSIS — Z86711 Personal history of pulmonary embolism: Secondary | ICD-10-CM | POA: Diagnosis not present

## 2018-05-02 LAB — CBC WITH DIFFERENTIAL/PLATELET
Abs Immature Granulocytes: 0.01 10*3/uL (ref 0.00–0.07)
Basophils Absolute: 0 10*3/uL (ref 0.0–0.1)
Basophils Relative: 0 %
Eosinophils Absolute: 0 10*3/uL (ref 0.0–0.5)
Eosinophils Relative: 1 %
HCT: 39.4 % (ref 39.0–52.0)
Hemoglobin: 13.3 g/dL (ref 13.0–17.0)
Immature Granulocytes: 0 %
Lymphocytes Relative: 27 %
Lymphs Abs: 1.2 10*3/uL (ref 0.7–4.0)
MCH: 32.7 pg (ref 26.0–34.0)
MCHC: 33.8 g/dL (ref 30.0–36.0)
MCV: 96.8 fL (ref 80.0–100.0)
Monocytes Absolute: 0.3 10*3/uL (ref 0.1–1.0)
Monocytes Relative: 7 %
Neutro Abs: 3 10*3/uL (ref 1.7–7.7)
Neutrophils Relative %: 65 %
Platelets: 206 10*3/uL (ref 150–400)
RBC: 4.07 MIL/uL — ABNORMAL LOW (ref 4.22–5.81)
RDW: 13.6 % (ref 11.5–15.5)
WBC: 4.6 10*3/uL (ref 4.0–10.5)
nRBC: 0 % (ref 0.0–0.2)

## 2018-05-02 LAB — IRON AND TIBC
Iron: 76 ug/dL (ref 45–182)
Saturation Ratios: 29 % (ref 17.9–39.5)
TIBC: 259 ug/dL (ref 250–450)
UIBC: 183 ug/dL

## 2018-05-02 LAB — FERRITIN: Ferritin: 73 ng/mL (ref 24–336)

## 2018-05-03 ENCOUNTER — Ambulatory Visit: Payer: Medicare HMO | Admitting: Oncology

## 2018-05-06 ENCOUNTER — Inpatient Hospital Stay (HOSPITAL_BASED_OUTPATIENT_CLINIC_OR_DEPARTMENT_OTHER): Payer: Medicare HMO | Admitting: Oncology

## 2018-05-06 ENCOUNTER — Encounter: Payer: Self-pay | Admitting: Oncology

## 2018-05-06 ENCOUNTER — Other Ambulatory Visit: Payer: Self-pay | Admitting: *Deleted

## 2018-05-06 ENCOUNTER — Other Ambulatory Visit: Payer: Self-pay

## 2018-05-06 DIAGNOSIS — R310 Gross hematuria: Secondary | ICD-10-CM | POA: Diagnosis not present

## 2018-05-06 DIAGNOSIS — Z7901 Long term (current) use of anticoagulants: Secondary | ICD-10-CM

## 2018-05-06 DIAGNOSIS — Z86711 Personal history of pulmonary embolism: Secondary | ICD-10-CM | POA: Diagnosis not present

## 2018-05-06 DIAGNOSIS — D500 Iron deficiency anemia secondary to blood loss (chronic): Secondary | ICD-10-CM

## 2018-05-06 NOTE — Progress Notes (Signed)
HEMATOLOGY-ONCOLOGY TeleHEALTH VISIT PROGRESS NOTE  I connected with Scott [name] on 05/06/18 at 10:30 AM EDT by telephone visit and verified that I am speaking with the correct person using two identifiers. I discussed the limitations, risks, security and privacy concerns of performing an evaluation and management service by telemedicine and the availability of in-person appointments. I also discussed with the patient that there may be a patient responsible charge related to this service. The patient expressed understanding and agreed to proceed.   Other persons participating in the visit and their role in the encounter:  Geraldine Solar, CMA, check in patient     Patient's location: Home  Provider's location:home   Chief Complaint: Follow up for chronic anticoagulation, history of PE  INTERVAL HISTORY Scott [name] is a 78 y.o. male who has above history reviewed by me today presents for follow up visit for management of history of PE, chronic anticoagulation.  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 Hypercoagulable work up negative.   He has tolerated well until recently he reports having intermittent blood recently.   He has been seen by Dr.Stoiff in Feb 2020 and cystoscopy showed no significantly abnormalities and he suspect hematuria is from the prostate. Urine cytology is negative. Per EMR, Dr.Stoiff discussed with patient and Finasteride was prescribed.  Recent phone note on 04/17/2018, Dr.Stoiff feels finasteride can take up to 3 months to work. Only other options would be TURP but due to Covid 19 pandemic situation, elective surgery can not be done at this point.   Patient reports feeling tired and fatigued. He had labs done last week.   Review of Systems  Constitutional: Positive for fatigue. Negative for appetite change, chills, fever and unexpected weight  change.  HENT:   Negative for hearing loss and voice change.   Eyes: Negative for eye problems and icterus.  Respiratory: Negative for chest tightness, cough and shortness of breath.   Cardiovascular: Negative for chest pain and leg swelling.  Gastrointestinal: Negative for abdominal distention and abdominal pain.  Endocrine: Negative for hot flashes.  Genitourinary: Positive for hematuria. Negative for difficulty urinating, dysuria and frequency.   Musculoskeletal: Negative for arthralgias.  Skin: Negative for itching and rash.  Neurological: Negative for light-headedness and numbness.  Hematological: Negative for adenopathy. Does not bruise/bleed easily.  Psychiatric/Behavioral: Negative for confusion.    Past Medical History:  Diagnosis Date  . Arthritis    right hip, since a fall  . CAD (coronary artery disease)    a. 04/2012 Cath: 3VD->Med Rx;  b. 04/2013 PCI RCA (2 DES); c. 03/2014 PCI: LAD 80 (3.0x23 Xience [place] DES); d. 06/2014 CABG x 2 ([facility]) LIMA->LAD, VG->OM; e. 12/2014 Cath ([facility]): patent grafts->Med Rx; f. 08/2015 Cath: patent grafts; g. 11/2016 Cath: LM min irregs, LAD 20p, 75m, LCX 100p/m, RCA 20ost, 10p/m/d, VG->OM1 nl, LIMA->dLAD nl; f. 07/2017 MV: No isch, EF 51%.  . Cardiomyopathy, ischemic    a. 04/2012 Echo: EF 40-45%;  b. 08/2013 Echo: EF 45-50%; c. 01/2015 Echo: EF 40-45%; d. 07/2016 Echo: EF 60-65%; e. 09/2017 Echo: EF 55-60%, Gr1 DD.  . Carotid arterial disease ([place])    a. 05/2013 Carotid U/S; bilat 40-50% ICA stenosis.  . Chronic Chest Pain   . Chronic systolic CHF (congestive heart failure) ([place])    a. 01/2015 Echo: EF 40-45%; b. 07/2016 Echo: EF 60-65%; c. 09/2017 Echo: EF 55-60%, Gr1 DD,  nl RV fxn.  . Cough   . Diverticulosis   . Headache 1970's   migraine  . History of blood transfusion 2016   post op  . Hyperlipidemia   . Hypertension   . Iron deficiency anemia due to chronic blood loss 09/11/2016  . Myocardial infarction [facility])    unsure of when  . Pulmonary  emboli ([place]) 07/2016   a. On Xarelto.  . Reflux esophagitis   . Sternal pain    a. 03/2016 s/p Sternal wire removal; b. 05/2016 s/p redo median sternotomy for sternal debridement and sternal plating.  . Tubular adenoma of colon    Past Surgical History:  Procedure Laterality Date  . CARDIAC CATHETERIZATION  05/01/2012  . CARDIAC CATHETERIZATION  04/2013   [facility];x3 stent  . CARDIAC CATHETERIZATION  01/11/15    [facility]  . CARDIAC CATHETERIZATION N/A 09/16/2015   Procedure: LEFT HEART CATH AND CORS/GRAFTS ANGIOGRAPHY;  Surgeon: Minna Merritts, MD;  Location: [facility];  Service: Cardiovascular;  Laterality: N/A;  . CARPAL TUNNEL RELEASE     right hand  . CATARACT EXTRACTION     LEFT  . CATARACT EXTRACTION W/PHACO Left 09/16/2014   Procedure: CATARACT EXTRACTION PHACO AND INTRAOCULAR LENS PLACEMENT (IOC);  Surgeon: Leandrew Koyanagi, MD;  Location: [place];  Service: Ophthalmology;  Laterality: Left;  . COLONOSCOPY    . COLONOSCOPY WITH PROPOFOL N/A 01/28/2016   Procedure: COLONOSCOPY WITH PROPOFOL;  Surgeon: Lollie Sails, MD;  Location: [facility] ENDOSCOPY;  Service: Endoscopy;  Laterality: N/A;  . CORONARY ANGIOPLASTY WITH STENT PLACEMENT  04/13/2014  . CORONARY ARTERY BYPASS GRAFT  06-26-14   x3 bypasses  . DE QUERVAIN'S RELEASE Left 08/22/2012  . ESOPHAGOGASTRODUODENOSCOPY (EGD) WITH PROPOFOL N/A 04/26/2015   Procedure: ESOPHAGOGASTRODUODENOSCOPY (EGD) WITH PROPOFOL;  Surgeon: Hulen Luster, MD;  Location: [facility] ENDOSCOPY;  Service: Gastroenterology;  Laterality: N/A;  . ESOPHAGOGASTRODUODENOSCOPY (EGD) WITH PROPOFOL N/A 05/29/2017   Procedure: ESOPHAGOGASTRODUODENOSCOPY (EGD) WITH PROPOFOL;  Surgeon: Lollie Sails, MD;  Location: [facility] ENDOSCOPY;  Service: Endoscopy;  Laterality: N/A;  . LEFT HEART CATH AND CORONARY ANGIOGRAPHY N/A 12/04/2016   Procedure: LEFT HEART CATH AND CORS/GRAFTS  ANGIOGRAPHY;  Surgeon: Wellington Hampshire, MD;  Location: [place] [facility];  Service:  Cardiovascular;  Laterality: N/A;  . LEFT HEART CATH AND CORS/GRAFTS ANGIOGRAPHY N/A 04/09/2018   Procedure: LEFT HEART CATH AND CORS/GRAFTS ANGIOGRAPHY;  Surgeon: Isaias Cowman, MD;  Location: [place] [facility];  Service: Cardiovascular;  Laterality: N/A;  . RIB PLATING N/A 05/25/2016   Procedure: STERNAL PLATING;  Surgeon: Melrose Nakayama, MD;  Location: [place];  Service: Thoracic;  Laterality: N/A;  . right shoulder    . STERNAL WIRES REMOVAL N/A 03/30/2016   Procedure: STERNAL WIRES REMOVAL;  Surgeon: Melrose Nakayama, MD;  Location: [facility];  Service: Thoracic;  Laterality: N/A;  . TONSILLECTOMY      Family History  Problem Relation Age of Onset  . Heart attack Father 56       MI  . Cancer Maternal Uncle     Social History   Socioeconomic History  . Marital status: Married    Spouse name: Not on file  . Number of children: Not on file  . Years of education: Not on file  . Highest education level: Not on file  Occupational History  . Not on file  Social Needs  . Financial resource strain: Not on file  . Food insecurity:    Worry: Not  on file    Inability: Not on file  . Transportation needs:    Medical: Not on file    Non-medical: Not on file  Tobacco Use  . Smoking status: Former Smoker    Packs/day: 1.00    Years: 35.00    Pack years: 35.00    Types: Cigarettes    Start date: 05/02/2009  . Smokeless tobacco: Former Systems developer  . Tobacco comment: quit in 2006  Substance and Sexual Activity  . Alcohol use: No  . Drug use: No  . Sexual activity: Yes  Lifestyle  . Physical activity:    Days per week: Not on file    Minutes per session: Not on file  . Stress: Not on file  Relationships  . Social connections:    Talks on phone: Not on file    Gets together: Not on file    Attends religious service: Not on file    Active member of club or organization: Not on file    Attends meetings of clubs or organizations: Not on file    Relationship status: Not on  file  . Intimate partner violence:    Fear of current or ex partner: Not on file    Emotionally abused: Not on file    Physically abused: Not on file    Forced sexual activity: Not on file  Other Topics Concern  . Not on file  Social History Narrative  . Not on file    Current Outpatient Medications on File Prior to Visit  Medication Sig Dispense Refill  . atorvastatin (LIPITOR) 80 MG tablet Take 1 tablet (80 mg total) by mouth daily at 6 PM. 30 tablet 0  . diphenhydramine-acetaminophen (TYLENOL PM) 25-500 MG TABS tablet Take 1-2 tablets by mouth at bedtime as needed (pain.).    Marland Kitchen EXCEDRIN BACK & BODY 250-250 MG tablet Take 1 tablet by mouth every 4 (four) hours as needed for pain.    Marland Kitchen ezetimibe (ZETIA) 10 MG tablet Take 1 tablet (10 mg total) by mouth daily. 90 tablet 3  . finasteride (PROSCAR) 5 MG tablet Take 1 tablet (5 mg total) by mouth daily. (Patient taking differently: Take 5 mg by mouth every evening. ) 90 tablet 1  . isosorbide mononitrate (IMDUR) 30 MG 24 hr tablet Take 30 mg by mouth.    . nitroGLYCERIN (NITROSTAT) 0.4 MG SL tablet Place 1 tablet (0.4 mg total) under the tongue every 5 (five) minutes as needed for chest pain. 25 tablet 6  . ranolazine (RANEXA) 1000 MG SR tablet Take 1 tablet (1,000 mg total) by mouth 2 (two) times daily. 180 tablet 3  . rivaroxaban (XARELTO) 20 MG TABS tablet Take 1 tablet (20 mg total) by mouth daily. 90 tablet 3   Current Facility-Administered Medications on File Prior to Visit  Medication Dose Route Frequency Provider Last Rate Last Dose  . lidocaine (XYLOCAINE) 2 % jelly 1 application  1 application Urethral Once Stoioff, Ronda Fairly, MD        Allergies  Allergen Reactions  . Contrast Media [Iodinated Diagnostic Agents] Hives  . Metrizamide Hives  . Oxycodone Hcl Itching  . Vicodin [Hydrocodone-Acetaminophen] Itching       Observations/Objective: There were no vitals filed for this visit. There is no height or weight on file to  calculate BMI.  Pain 0  CBC    Component Value Date/Time   WBC 4.6 05/02/2018 1030   RBC 4.07 (L) 05/02/2018 1030   HGB 13.3 05/02/2018 1030  HGB 14.6 09/13/2015 1155   HCT 39.4 05/02/2018 1030   HCT 44.0 09/13/2015 1155   PLT 206 05/02/2018 1030   PLT 203 09/13/2015 1155   MCV 96.8 05/02/2018 1030   MCV 88 09/13/2015 1155   MCV 66 (L) 03/02/2014 0459   MCH 32.7 05/02/2018 1030   MCHC 33.8 05/02/2018 1030   RDW 13.6 05/02/2018 1030   RDW 14.8 09/13/2015 1155   RDW 19.8 (H) 03/02/2014 0459   LYMPHSABS 1.2 05/02/2018 1030   LYMPHSABS 2.0 09/13/2015 1155   LYMPHSABS 2.2 03/02/2014 0459   MONOABS 0.3 05/02/2018 1030   MONOABS 0.5 03/02/2014 0459   EOSABS 0.0 05/02/2018 1030   EOSABS 0.1 09/13/2015 1155   EOSABS 0.2 03/02/2014 0459   BASOSABS 0.0 05/02/2018 1030   BASOSABS 0.0 09/13/2015 1155   BASOSABS 0.0 03/02/2014 0459    CMP     Component Value Date/Time   NA 140 11/09/2017 1248   NA 141 09/13/2015 1155   NA 141 03/02/2014 0459   K 4.0 11/09/2017 1248   K 4.0 03/02/2014 0459   CL 106 11/09/2017 1248   CL 106 03/02/2014 0459   CO2 28 11/09/2017 1248   CO2 27 03/02/2014 0459   GLUCOSE 85 11/09/2017 1248   GLUCOSE 90 03/02/2014 0459   BUN 18 11/09/2017 1248   BUN 16 09/13/2015 1155   BUN 18 03/02/2014 0459   CREATININE 1.20 03/14/2018 1025   CREATININE 1.29 03/02/2014 0459   CALCIUM 9.3 11/09/2017 1248   CALCIUM 8.2 (L) 03/02/2014 0459   PROT 6.1 (L) 10/02/2017 1310   PROT 6.6 06/12/2016 0954   PROT 7.5 03/01/2014 1139   ALBUMIN 3.2 (L) 10/02/2017 1310   ALBUMIN 4.0 06/12/2016 0954   ALBUMIN 3.5 03/01/2014 1139   AST 24 10/02/2017 1310   AST 30 03/01/2014 1139   ALT 23 10/02/2017 1310   ALT 33 03/01/2014 1139   ALKPHOS 44 10/02/2017 1310   ALKPHOS 49 03/01/2014 1139   BILITOT 0.9 10/02/2017 1310   BILITOT 0.2 06/12/2016 0954   BILITOT 0.4 03/01/2014 1139   GFRNONAA >60 11/09/2017 1248   GFRNONAA 58 (L) 03/02/2014 0459   GFRNONAA 52 (L) 09/04/2013  0403   GFRAA >60 11/09/2017 1248   GFRAA >60 03/02/2014 0459   GFRAA >60 09/04/2013 0403     Assessment and Plan: 1. Chronic anticoagulation   2. History of pulmonary embolism   3. Gross hematuria     Labs are reviewed and discussed with patient.  Hemoglobin has decreased comparing to his baseline, but still within normal limit.  His iron panel shows adequate iron store.  I discussed with patient that with his ongoing intermittent blood loss from hematuria,  Will need to monitor his labs closely.   Follow Up Instructions: Follow up in 5 weeks, lab, MD assessment and +/- venofer I discussed the assessment and treatment plan with the patient. The patient was provided an opportunity to ask questions and all were answered. The patient agreed with the plan and demonstrated an understanding of the instructions.  The patient was advised to call back or seek an in-person evaluation if the symptoms worsen or if the condition fails to improve as anticipated.   I provided 14 minutes of non face-to-face telephone visit time during this encounter, and > 50% was spent counseling as documented under my assessment & plan.  Earlie Server, MD 05/06/2018 12:36 PM

## 2018-05-06 NOTE — Progress Notes (Signed)
Called patient for Televisit.  Patient c/o fatigue.  Patient states that he has had blood in urine that started in December, has seen urology for this.

## 2018-06-06 ENCOUNTER — Other Ambulatory Visit: Payer: Self-pay

## 2018-06-07 ENCOUNTER — Other Ambulatory Visit: Payer: Self-pay

## 2018-06-07 ENCOUNTER — Inpatient Hospital Stay: Payer: Medicare HMO | Attending: Oncology

## 2018-06-07 DIAGNOSIS — Z87891 Personal history of nicotine dependence: Secondary | ICD-10-CM | POA: Insufficient documentation

## 2018-06-07 DIAGNOSIS — Z7901 Long term (current) use of anticoagulants: Secondary | ICD-10-CM | POA: Diagnosis not present

## 2018-06-07 DIAGNOSIS — Z86711 Personal history of pulmonary embolism: Secondary | ICD-10-CM | POA: Diagnosis not present

## 2018-06-07 DIAGNOSIS — D500 Iron deficiency anemia secondary to blood loss (chronic): Secondary | ICD-10-CM

## 2018-06-07 DIAGNOSIS — D508 Other iron deficiency anemias: Secondary | ICD-10-CM | POA: Diagnosis not present

## 2018-06-07 DIAGNOSIS — I10 Essential (primary) hypertension: Secondary | ICD-10-CM | POA: Insufficient documentation

## 2018-06-07 DIAGNOSIS — Z79899 Other long term (current) drug therapy: Secondary | ICD-10-CM | POA: Diagnosis not present

## 2018-06-07 DIAGNOSIS — I252 Old myocardial infarction: Secondary | ICD-10-CM | POA: Insufficient documentation

## 2018-06-07 LAB — CBC WITH DIFFERENTIAL/PLATELET
Abs Immature Granulocytes: 0.01 10*3/uL (ref 0.00–0.07)
Basophils Absolute: 0 10*3/uL (ref 0.0–0.1)
Basophils Relative: 0 %
Eosinophils Absolute: 0 10*3/uL (ref 0.0–0.5)
Eosinophils Relative: 1 %
HCT: 41.7 % (ref 39.0–52.0)
Hemoglobin: 13.9 g/dL (ref 13.0–17.0)
Immature Granulocytes: 0 %
Lymphocytes Relative: 32 %
Lymphs Abs: 1.5 10*3/uL (ref 0.7–4.0)
MCH: 31.9 pg (ref 26.0–34.0)
MCHC: 33.3 g/dL (ref 30.0–36.0)
MCV: 95.6 fL (ref 80.0–100.0)
Monocytes Absolute: 0.5 10*3/uL (ref 0.1–1.0)
Monocytes Relative: 10 %
Neutro Abs: 2.7 10*3/uL (ref 1.7–7.7)
Neutrophils Relative %: 57 %
Platelets: 178 10*3/uL (ref 150–400)
RBC: 4.36 MIL/uL (ref 4.22–5.81)
RDW: 13.4 % (ref 11.5–15.5)
WBC: 4.8 10*3/uL (ref 4.0–10.5)
nRBC: 0 % (ref 0.0–0.2)

## 2018-06-07 LAB — IRON AND TIBC
Iron: 68 ug/dL (ref 45–182)
Saturation Ratios: 25 % (ref 17.9–39.5)
TIBC: 276 ug/dL (ref 250–450)
UIBC: 208 ug/dL

## 2018-06-07 LAB — FERRITIN: Ferritin: 38 ng/mL (ref 24–336)

## 2018-06-11 ENCOUNTER — Inpatient Hospital Stay: Payer: Medicare HMO | Attending: Oncology | Admitting: Oncology

## 2018-06-11 ENCOUNTER — Other Ambulatory Visit: Payer: Self-pay

## 2018-06-11 ENCOUNTER — Inpatient Hospital Stay: Payer: Medicare HMO

## 2018-06-11 ENCOUNTER — Encounter: Payer: Self-pay | Admitting: Oncology

## 2018-06-11 VITALS — BP 118/74 | HR 64 | Temp 98.2°F | Resp 18 | Ht 70.0 in

## 2018-06-11 DIAGNOSIS — Z87891 Personal history of nicotine dependence: Secondary | ICD-10-CM | POA: Diagnosis not present

## 2018-06-11 DIAGNOSIS — Z79899 Other long term (current) drug therapy: Secondary | ICD-10-CM | POA: Diagnosis not present

## 2018-06-11 DIAGNOSIS — I252 Old myocardial infarction: Secondary | ICD-10-CM | POA: Diagnosis not present

## 2018-06-11 DIAGNOSIS — Z86711 Personal history of pulmonary embolism: Secondary | ICD-10-CM

## 2018-06-11 DIAGNOSIS — I10 Essential (primary) hypertension: Secondary | ICD-10-CM | POA: Diagnosis not present

## 2018-06-11 DIAGNOSIS — D500 Iron deficiency anemia secondary to blood loss (chronic): Secondary | ICD-10-CM

## 2018-06-11 DIAGNOSIS — D508 Other iron deficiency anemias: Secondary | ICD-10-CM | POA: Diagnosis not present

## 2018-06-11 DIAGNOSIS — Z7901 Long term (current) use of anticoagulants: Secondary | ICD-10-CM | POA: Diagnosis not present

## 2018-06-15 NOTE — Progress Notes (Signed)
[facility]  Follow Up Visit:  Patient Care Team: Baxter Hire, MD as PCP - General (Internal Medicine) Rockey Situ [name], MD as PCP - Cardiology (Cardiology) Madelyn Brunner, MD (Internal Medicine) Alisa Graff, FNP as Nurse Practitioner (Cardiology) Minna Merritts, MD as Consulting Physician (Cardiology)  REASON FOR VISIT Follow up for treatment of iron deficiency anemia  HISTORY OF PRESENTING ILLNESS: Scott [name] 78 y.o. male with PMH listed as below who is referred to me for evaluation and management of pulmonary embolism.  Scott [name] has a history paroxymal atrial fibrillation on Eliquis 5mg  BID until recently, history of CAD s/p CABG x 2 at [facility] in 2016 with chronic chest pain, s/p removal of sternal wire on 03/2016,  s/p sternal debridement and plating on 05/25/2016 in the setting of CT chest performed by pain management showing sternal nonunion of the manubrium with subsequent improvement in his pain. Perioperatively his anticoagulation with Eliquis was discontinue a few days prior surgery and resumed after surgery. Operation note mentioned that during the procedure, the left lung was densely adherent to the underside of the sternum and tear in the lung did occur and was sutured. He presents  to [facility] on 08/04/2016 for evaluation of SOB and was found to have a PE. Patient states that he was compliant with Eliquis.  CTA chest showed left upper lobe PE extending into the left main pulmonary artery. His Eliquis was changed to Xarelto. Lower extremity dopplers were negative for DVT. Hypercoagulable workup was sent and all tests were negative except a mild low protein S function.  No formal cancer workup.  Sed rate normal, heavy metals negative, TSH mildly reduced at 0.253. 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     Hypercoagulable work up: discussed with patient. He is tested negative  for factor 5 leiden mutation, prothrombin mutation, anti phospholipin antibody panels. Normal protein C activity. Normal total protein S, mildly low protein S function, can be falsely low in the acute setting of thrombosis.Congential protein S deficiency is unlikely given that he has no prior history of VTE. Repeat protein S total and free were normal.   # He has had one dose of Feraheme and during the treatment he had a chest tightness and end of infusion. Has received IV iron Venofer and tolerates well.   #chronic intermittent chest pain and shortness of breath that has been previously extensively worked up.  He follows up with Dr. Nyoka Cowden for his heart care.  Extensive cardiology disease CAD status post prior RCA and LAD stenting as well as CABG x2 chronic chest pain status post sternal wire removal with sternal debridement and plating, ischemia cardiomyopathy, chronic dyspnea.  # CT angios chest PE protocol done on 11/07/2016 showed no lung nodules or mass.  INTERVAL HISTORY Patient presents for follow-up of chronic anticoagulation, iron deficiency present for follow up .  Reports feeling ok at baseline.  Anticoagulation with Xarelto for history of renal embolism.  Reports easy bruising. Chronic intermittent chest pain and shortness of breath at baseline.  Previously underwent extensive cardiology work-up. No further hemoptysis episodes.    Review of Systems  Constitutional: Positive for fatigue. Negative for appetite change, chills, diaphoresis, fever and unexpected weight change.  HENT:   Negative for hearing loss, lump/mass, mouth sores, nosebleeds, sore throat and voice change.   Eyes: Negative for eye problems and icterus.  Respiratory: Positive  for shortness of breath. Negative for chest tightness, cough, hemoptysis and wheezing.        SOB with exertion.   Cardiovascular: Negative for chest pain and leg swelling.       Chronic Intermittent chest pain.  Gastrointestinal: Negative for  abdominal distention, abdominal pain, blood in stool, diarrhea, nausea and rectal pain.  Endocrine: Negative for hot flashes.  Genitourinary: Negative for bladder incontinence, difficulty urinating, dysuria, frequency, hematuria and nocturia.   Musculoskeletal: Negative for arthralgias, back pain, flank pain, gait problem and myalgias.  Skin: Negative for itching and rash.  Neurological: Negative for dizziness, gait problem, headaches, light-headedness, numbness and seizures.  Hematological: Negative for adenopathy. Does not bruise/bleed easily.  Psychiatric/Behavioral: Negative for confusion and decreased concentration. The patient is not nervous/anxious.     MEDICAL HISTORY: Past Medical History:  Diagnosis Date  . Arthritis    right hip, since a fall  . CAD (coronary artery disease)    a. 04/2012 Cath: 3VD->Med Rx;  b. 04/2013 PCI RCA (2 DES); c. 03/2014 PCI: LAD 80 (3.0x23 Xience Alpine DES); d. 06/2014 CABG x 2 ([facility]) LIMA->LAD, VG->OM; e. 12/2014 Cath ([facility]): patent grafts->Med Rx; f. 08/2015 Cath: patent grafts; g. 11/2016 Cath: LM min irregs, LAD 20p, 30m, LCX 100p/m, RCA 20ost, 10p/m/d, VG->OM1 nl, LIMA->dLAD nl; f. 07/2017 MV: No isch, EF 51%.  . Cardiomyopathy, ischemic    a. 04/2012 Echo: EF 40-45%;  b. 08/2013 Echo: EF 45-50%; c. 01/2015 Echo: EF 40-45%; d. 07/2016 Echo: EF 60-65%; e. 09/2017 Echo: EF 55-60%, Gr1 DD.  . Carotid arterial disease ([place])    a. 05/2013 Carotid U/S; bilat 40-50% ICA stenosis.  . Chronic Chest Pain   . Chronic systolic CHF (congestive heart failure) ([place])    a. 01/2015 Echo: EF 40-45%; b. 07/2016 Echo: EF 60-65%; c. 09/2017 Echo: EF 55-60%, Gr1 DD, nl RV fxn.  . Cough   . Diverticulosis   . Headache 1970's   migraine  . History of blood transfusion 2016   post op  . Hyperlipidemia   . Hypertension   . Iron deficiency anemia due to chronic blood loss 09/11/2016  . Myocardial infarction [facility])    unsure of when  . Pulmonary emboli ([place]) 07/2016   a. On  Xarelto.  . Reflux esophagitis   . Sternal pain    a. 03/2016 s/p Sternal wire removal; b. 05/2016 s/p redo median sternotomy for sternal debridement and sternal plating.  . Tubular adenoma of colon     SURGICAL HISTORY: Past Surgical History:  Procedure Laterality Date  . CARDIAC CATHETERIZATION  05/01/2012  . CARDIAC CATHETERIZATION  04/2013   [facility];x3 stent  . CARDIAC CATHETERIZATION  01/11/15    [facility]  . CARDIAC CATHETERIZATION N/A 09/16/2015   Procedure: LEFT HEART CATH AND CORS/GRAFTS ANGIOGRAPHY;  Surgeon: Minna Merritts, MD;  Location: [facility];  Service: Cardiovascular;  Laterality: N/A;  . CARPAL TUNNEL RELEASE     right hand  . CATARACT EXTRACTION     LEFT  . CATARACT EXTRACTION W/PHACO Left 09/16/2014   Procedure: CATARACT EXTRACTION PHACO AND INTRAOCULAR LENS PLACEMENT (IOC);  Surgeon: Leandrew Koyanagi, MD;  Location: [facility];  Service: Ophthalmology;  Laterality: Left;  . COLONOSCOPY    . COLONOSCOPY WITH PROPOFOL N/A 01/28/2016   Procedure: COLONOSCOPY WITH PROPOFOL;  Surgeon: Lollie Sails, MD;  Location: [facility];  Service: Endoscopy;  Laterality: N/A;  . CORONARY ANGIOPLASTY WITH STENT PLACEMENT  04/13/2014  . CORONARY ARTERY BYPASS GRAFT  06-26-14   x3 bypasses  . DE QUERVAIN'S RELEASE Left 08/22/2012  . ESOPHAGOGASTRODUODENOSCOPY (EGD) WITH PROPOFOL N/A 04/26/2015   Procedure: ESOPHAGOGASTRODUODENOSCOPY (EGD) WITH PROPOFOL;  Surgeon: Hulen Luster, MD;  Location: [facility] ENDOSCOPY;  Service: Gastroenterology;  Laterality: N/A;  . ESOPHAGOGASTRODUODENOSCOPY (EGD) WITH PROPOFOL N/A 05/29/2017   Procedure: ESOPHAGOGASTRODUODENOSCOPY (EGD) WITH PROPOFOL;  Surgeon: Lollie Sails, MD;  Location: [facility] ENDOSCOPY;  Service: Endoscopy;  Laterality: N/A;  . LEFT HEART CATH AND CORONARY ANGIOGRAPHY N/A 12/04/2016   Procedure: LEFT HEART CATH AND CORS/GRAFTS  ANGIOGRAPHY;  Surgeon: Wellington Hampshire, MD;  Location: [place] [facility];  Service:  Cardiovascular;  Laterality: N/A;  . LEFT HEART CATH AND CORS/GRAFTS ANGIOGRAPHY N/A 04/09/2018   Procedure: LEFT HEART CATH AND CORS/GRAFTS ANGIOGRAPHY;  Surgeon: Isaias Cowman, MD;  Location: [place] [facility];  Service: Cardiovascular;  Laterality: N/A;  . RIB PLATING N/A 05/25/2016   Procedure: STERNAL PLATING;  Surgeon: Melrose Nakayama, MD;  Location: [facility];  Service: Thoracic;  Laterality: N/A;  . right shoulder    . STERNAL WIRES REMOVAL N/A 03/30/2016   Procedure: STERNAL WIRES REMOVAL;  Surgeon: Melrose Nakayama, MD;  Location: [facility];  Service: Thoracic;  Laterality: N/A;  . TONSILLECTOMY      SOCIAL HISTORY: Social History   Socioeconomic History  . Marital status: Married    Spouse name: Not on file  . Number of children: Not on file  . Years of education: Not on file  . Highest education level: Not on file  Occupational History  . Not on file  Social Needs  . Financial resource strain: Not on file  . Food insecurity:    Worry: Not on file    Inability: Not on file  . Transportation needs:    Medical: Not on file    Non-medical: Not on file  Tobacco Use  . Smoking status: Former Smoker    Packs/day: 1.00    Years: 35.00    Pack years: 35.00    Types: Cigarettes    Start date: 05/02/2009  . Smokeless tobacco: Former Systems developer  . Tobacco comment: quit in 2006  Substance and Sexual Activity  . Alcohol use: No  . Drug use: No  . Sexual activity: Yes  Lifestyle  . Physical activity:    Days per week: Not on file    Minutes per session: Not on file  . Stress: Not on file  Relationships  . Social connections:    Talks on phone: Not on file    Gets together: Not on file    Attends religious service: Not on file    Active member of club or organization: Not on file    Attends meetings of clubs or organizations: Not on file    Relationship status: Not on file  . Intimate partner violence:    Fear of current or ex partner: Not on file    Emotionally  abused: Not on file    Physically abused: Not on file    Forced sexual activity: Not on file  Other Topics Concern  . Not on file  Social History Narrative  . Not on file    FAMILY HISTORY Family History  Problem Relation Age of Onset  . Heart attack Father 13       MI  . Cancer Maternal Uncle     ALLERGIES:  is allergic to contrast media [iodinated diagnostic agents]; metrizamide; oxycodone hcl; and vicodin [hydrocodone-acetaminophen].  MEDICATIONS:  Current Outpatient Medications  Medication Sig Dispense Refill  . atorvastatin (LIPITOR) 80 MG tablet Take 1 tablet (80 mg total) by mouth daily at 6 PM. 30 tablet 0  . ezetimibe (ZETIA) 10 MG tablet Take 1 tablet (10 mg total) by mouth daily. 90 tablet 3  . finasteride (PROSCAR) 5 MG tablet Take 1 tablet (5 mg total) by mouth daily. (Patient taking differently: Take 5 mg by mouth every evening. ) 90 tablet 1  . isosorbide mononitrate (IMDUR) 60 MG 24 hr tablet Take 1 tablet by mouth 2 (two) times a day.    . ranolazine (RANEXA) 1000 MG SR tablet Take 1 tablet (1,000 mg total) by mouth 2 (two) times daily. 180 tablet 3  . rivaroxaban (XARELTO) 20 MG TABS tablet Take 1 tablet (20 mg total) by mouth daily. 90 tablet 3  . diphenhydramine-acetaminophen (TYLENOL PM) 25-500 MG TABS tablet Take 1-2 tablets by mouth at bedtime as needed (pain.).    Marland Kitchen EXCEDRIN BACK & BODY 250-250 MG tablet Take 1 tablet by mouth every 4 (four) hours as needed for pain.    . nitroGLYCERIN (NITROSTAT) 0.4 MG SL tablet Place 1 tablet (0.4 mg total) under the tongue every 5 (five) minutes as needed for chest pain. (Patient not taking: Reported on 06/11/2018) 25 tablet 6   Current Facility-Administered Medications  Medication Dose Route Frequency Provider Last Rate Last Dose  . lidocaine (XYLOCAINE) 2 % jelly 1 application  1 application Urethral Once Stoioff, Scott C, MD        PHYSICAL EXAMINATION:  ECOG PERFORMANCE STATUS: 1 - Symptomatic but completely  ambulatory   Vitals:   06/11/18 1251  BP: 118/74  Pulse: 64  Resp: 18  Temp: 98.2 F (36.8 C)    There were no vitals filed for this visit.   Physical Exam  Constitutional: He is oriented to person, place, and time. No distress.  HENT:  Head: Normocephalic and atraumatic.  Nose: Nose normal.  Mouth/Throat: Oropharynx is clear and moist. No oropharyngeal exudate.  Eyes: Pupils are equal, round, and reactive to light. Conjunctivae and EOM are normal. No scleral icterus.  Neck: Normal range of motion. Neck supple.  Cardiovascular: Normal rate and regular rhythm.  No murmur heard. Pulmonary/Chest: Effort normal and breath sounds normal. No respiratory distress.  Abdominal: Soft. Bowel sounds are normal. He exhibits no distension. There is no abdominal tenderness.  Musculoskeletal: Normal range of motion.        General: No edema.  Neurological: He is alert and oriented to person, place, and time. No cranial nerve deficit. He exhibits normal muscle tone. Coordination normal.  Skin: Skin is warm and dry. He is not diaphoretic. No erythema.  Psychiatric: Affect normal.        LABORATORY DATA: I have personally reviewed the data as listed:  Orders Only on 06/07/2018  Component Date Value Ref Range Status  . Iron 06/07/2018 68  45 - 182 ug/dL Final  . TIBC 06/07/2018 276  250 - 450 ug/dL Final  . Saturation Ratios 06/07/2018 25  17.9 - 39.5 % Final  . UIBC 06/07/2018 208  ug/dL Final   Performed at [facility], [address], [place], [place] [address]  . Ferritin 06/07/2018 38  24 - 336 ng/mL Final   Performed at [facility], [address] [place], [place] [address]  . WBC 06/07/2018 4.8  4.0 - 10.5 K/uL Final  . RBC 06/07/2018 4.36  4.22 - 5.81 MIL/uL Final  . Hemoglobin 06/07/2018 13.9  13.0 - 17.0 g/dL Final  . HCT 06/07/2018 41.7  39.0 - 52.0 % Final  . MCV 06/07/2018 95.6  80.0 - 100.0 fL Final  . MCH 06/07/2018 31.9  26.0 - 34.0 pg Final   . MCHC 06/07/2018 33.3  30.0 - 36.0 g/dL Final  . RDW 06/07/2018 13.4  11.5 - 15.5 % Final  . Platelets 06/07/2018 178  150 - 400 K/uL Final  . nRBC 06/07/2018 0.0  0.0 - 0.2 % Final  . Neutrophils Relative % 06/07/2018 57  % Final  . Neutro Abs 06/07/2018 2.7  1.7 - 7.7 K/uL Final  . Lymphocytes Relative 06/07/2018 32  % Final  . Lymphs Abs 06/07/2018 1.5  0.7 - 4.0 K/uL Final  . Monocytes Relative 06/07/2018 10  % Final  . Monocytes Absolute 06/07/2018 0.5  0.1 - 1.0 K/uL Final  . Eosinophils Relative 06/07/2018 1  % Final  . Eosinophils Absolute 06/07/2018 0.0  0.0 - 0.5 K/uL Final  . Basophils Relative 06/07/2018 0  % Final  . Basophils Absolute 06/07/2018 0.0  0.0 - 0.1 K/uL Final  . Immature Granulocytes 06/07/2018 0  % Final  . Abs Immature Granulocytes 06/07/2018 0.01  0.00 - 0.07 K/uL Final   Performed at [facility], [address] [place], [place] [address]   Lab Results  Component Value Date   IRON 68 06/07/2018   TIBC 276 06/07/2018   IRONPCTSAT 25 06/07/2018   Lab Results  Component Value Date   FERRITIN 38 06/07/2018    RADIOGRAPHIC STUDIES: I have personally reviewed the radiological images as listed and agree with the findings in the report 08/08/2016 1. 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 No CT evidence of right heart straining. 2. Multi vessel coronary vascular calcification and CABG surgery changes.  01/28/2016 colonoscopy showed diverticulosis of sigmoid colon and distant descending colon. 04/26/2015 EGD normal. 05/29/2017 EGD normal.  ASSESSMENT/PLAN 1. Iron deficiency anemia secondary to blood loss (chronic)   2. Chronic anticoagulation   3. History of pulmonary embolism   labs are reviewed and discussed with patient.  Cbc showed stable hemoglobin, iron panel indicates adequate iron store.  Hold additional IV iron tretaments.   Chronic  anticoagulation for pulmonary embolism, tolerating well    # Follow up with me in 6 months with  CBC, iron TIBC, ferritin repeat prior to the visit.   Earlie Server, MD, PhD Hematology Oncology [facility] Pager- [phone] 06/15/2018

## 2018-06-24 ENCOUNTER — Other Ambulatory Visit: Payer: Self-pay

## 2018-06-24 ENCOUNTER — Ambulatory Visit (INDEPENDENT_AMBULATORY_CARE_PROVIDER_SITE_OTHER): Payer: Medicare HMO

## 2018-06-24 DIAGNOSIS — R42 Dizziness and giddiness: Secondary | ICD-10-CM | POA: Diagnosis not present

## 2018-06-24 DIAGNOSIS — I739 Peripheral vascular disease, unspecified: Secondary | ICD-10-CM

## 2018-06-24 DIAGNOSIS — I779 Disorder of arteries and arterioles, unspecified: Secondary | ICD-10-CM

## 2018-07-16 ENCOUNTER — Telehealth: Payer: Self-pay | Admitting: Urology

## 2018-07-16 MED ORDER — FAMOTIDINE 40 MG PO TABS: ORAL_TABLET | ORAL | 0 refills | Status: DC

## 2018-07-16 MED ORDER — DIPHENHYDRAMINE HCL 50 MG PO TABS: 50.0000 mg | ORAL_TABLET | Freq: Every evening | ORAL | 0 refills | Status: DC | PRN

## 2018-07-16 MED ORDER — PREDNISONE 50 MG PO TABS: ORAL_TABLET | ORAL | 0 refills | Status: DC

## 2018-07-16 NOTE — Telephone Encounter (Signed)
RX has been sent to [facility]

## 2018-07-16 NOTE — Telephone Encounter (Signed)
Patient is having a ct scan but he is allergic to contrast and will need to be medicated  Can someone please call this in to his pharmacy? I need to know when this has been done so that I can notify scheduling please and thank you.   Sharyn Lull

## 2018-07-25 ENCOUNTER — Ambulatory Visit: Admission: RE | Admit: 2018-07-25 | Payer: Medicare HMO | Source: Ambulatory Visit

## 2018-07-31 ENCOUNTER — Ambulatory Visit
Admission: RE | Admit: 2018-07-31 | Discharge: 2018-07-31 | Disposition: A | Discharge status: Home / Self Care | Payer: Medicare HMO | Source: Ambulatory Visit | Attending: Urology | Admitting: Urology

## 2018-07-31 ENCOUNTER — Other Ambulatory Visit: Payer: Self-pay | Admitting: Cardiovascular Disease

## 2018-07-31 ENCOUNTER — Other Ambulatory Visit: Payer: Self-pay

## 2018-07-31 DIAGNOSIS — R310 Gross hematuria: Secondary | ICD-10-CM | POA: Diagnosis not present

## 2018-07-31 LAB — POCT I-STAT CREATININE: Creatinine, Ser: 1.2 mg/dL (ref 0.61–1.24)

## 2018-07-31 MED ORDER — IOHEXOL 300 MG/ML  SOLN
125.0000 mL | Freq: Once | INTRAMUSCULAR | Status: AC | PRN
  Administered 2018-07-31: 125 mL via INTRAVENOUS

## 2018-07-31 NOTE — Telephone Encounter (Signed)
Spoke with patient and ask him if he was in need of prescription because I see that he last saw Dr. Saralyn Pilar last. He stated that it has already been filled and then hung up the phone on me.

## 2018-07-31 NOTE — Telephone Encounter (Signed)
Refill Request.  

## 2018-07-31 NOTE — Telephone Encounter (Signed)
Is pt still seeing Dr. Rockey Situ or did he transfer to [facility]? He last saw Dr. Rockey Situ on 03/21/18, but has seen Dr. Saralyn Pilar on 3/3, 3/24, 4/20, & 06/03/18. If he is no longer a pt in our practice, will route refill request to [facility].

## 2018-08-05 ENCOUNTER — Other Ambulatory Visit: Payer: Self-pay | Admitting: Radiology

## 2018-08-05 ENCOUNTER — Other Ambulatory Visit: Payer: Self-pay

## 2018-08-05 ENCOUNTER — Encounter: Payer: Self-pay | Admitting: Urology

## 2018-08-05 ENCOUNTER — Ambulatory Visit: Payer: Medicare HMO | Admitting: Urology

## 2018-08-05 ENCOUNTER — Telehealth: Payer: Self-pay | Admitting: Urology

## 2018-08-05 VITALS — BP 105/63 | HR 67 | Ht 70.0 in | Wt 176.0 lb

## 2018-08-05 DIAGNOSIS — R310 Gross hematuria: Secondary | ICD-10-CM

## 2018-08-05 DIAGNOSIS — N401 Enlarged prostate with lower urinary tract symptoms: Secondary | ICD-10-CM

## 2018-08-05 DIAGNOSIS — Z01818 Encounter for other preprocedural examination: Secondary | ICD-10-CM

## 2018-08-05 NOTE — H&P (View-Only) (Signed)
08/05/2018 11:18 AM   Scott [name] 21-Mar-1940 [id number]  Referring provider: Baxter Hire, MD [place],  [place] [address]  Chief Complaint  Patient presents with  . Hematuria  . Results    HPI: 78 year old male presents for follow-up of gross hematuria.  He was initially seen January 2020 with a 6-week history of intermittent gross hematuria.  He is on aspirin and Xarelto.  CT urogram was felt to show septal thickening of the wall of the left renal pelvis.  Cystoscopy was remarkable for moderate lateral lobe enlargement with prominent hypervascularity.  No bladder mucosal lesions were identified.  It was recommended repeating his CT urogram in 4 months.  He continues to have intermittent gross hematuria and over the past 2 weeks notes blood in his urine on a daily basis.  He denies flank/abdominal/pelvic pain or dysuria.    PMH: Past Medical History:  Diagnosis Date  . Arthritis    right hip, since a fall  . CAD (coronary artery disease)    a. 04/2012 Cath: 3VD->Med Rx;  b. 04/2013 PCI RCA (2 DES); c. 03/2014 PCI: LAD 80 (3.0x23 Xience Alpine DES); d. 06/2014 CABG x 2 ([facility]) LIMA->LAD, VG->OM; e. 12/2014 Cath ([facility]): patent grafts->Med Rx; f. 08/2015 Cath: patent grafts; g. 11/2016 Cath: LM min irregs, LAD 20p, 35m, LCX 100p/m, RCA 20ost, 10p/m/d, VG->OM1 nl, LIMA->dLAD nl; f. 07/2017 MV: No isch, EF 51%.  . Cardiomyopathy, ischemic    a. 04/2012 Echo: EF 40-45%;  b. 08/2013 Echo: EF 45-50%; c. 01/2015 Echo: EF 40-45%; d. 07/2016 Echo: EF 60-65%; e. 09/2017 Echo: EF 55-60%, Gr1 DD.  . Carotid arterial disease ([place])    a. 05/2013 Carotid U/S; bilat 40-50% ICA stenosis.  . Chronic Chest Pain   . Chronic systolic CHF (congestive heart failure) ([place])    a. 01/2015 Echo: EF 40-45%; b. 07/2016 Echo: EF 60-65%; c. 09/2017 Echo: EF 55-60%, Gr1 DD, nl RV fxn.  . Cough   . Diverticulosis   . Headache 1970's   migraine  . History of blood transfusion 2016   post op  .  Hyperlipidemia   . Hypertension   . Iron deficiency anemia due to chronic blood loss 09/11/2016  . Myocardial infarction [facility])    unsure of when  . Pulmonary emboli ([place]) 07/2016   a. On Xarelto.  . Reflux esophagitis   . Sternal pain    a. 03/2016 s/p Sternal wire removal; b. 05/2016 s/p redo median sternotomy for sternal debridement and sternal plating.  . Tubular adenoma of colon     Surgical History: Past Surgical History:  Procedure Laterality Date  . CARDIAC CATHETERIZATION  05/01/2012  . CARDIAC CATHETERIZATION  04/2013   [facility]  . CARDIAC CATHETERIZATION  01/11/15    [facility]  . CARDIAC CATHETERIZATION N/A 09/16/2015   Procedure: LEFT HEART CATH AND CORS/GRAFTS ANGIOGRAPHY;  Surgeon: Minna Merritts, MD;  Location: [place] CV LAB;  Service: Cardiovascular;  Laterality: N/A;  . CARPAL TUNNEL RELEASE     right hand  . CATARACT EXTRACTION     LEFT  . CATARACT EXTRACTION W/PHACO Left 09/16/2014   Procedure: CATARACT EXTRACTION PHACO AND INTRAOCULAR LENS PLACEMENT (IOC);  Surgeon: Leandrew Koyanagi, MD;  Location: [facility];  Service: Ophthalmology;  Laterality: Left;  . COLONOSCOPY    . COLONOSCOPY WITH PROPOFOL N/A 01/28/2016   Procedure: COLONOSCOPY WITH PROPOFOL;  Surgeon: Lollie Sails, MD;  Location[facility] ENDOSCOPY;  Service: Endoscopy;  Laterality: N/A;  .  CORONARY ANGIOPLASTY WITH STENT PLACEMENT  04/13/2014  . CORONARY ARTERY BYPASS GRAFT  06-26-14   x3 bypasses  . DE QUERVAIN'S RELEASE Left 08/22/2012  . ESOPHAGOGASTRODUODENOSCOPY (EGD) WITH PROPOFOL N/A 04/26/2015   Procedure: ESOPHAGOGASTRODUODENOSCOPY (EGD) WITH PROPOFOL;  Surgeon: Hulen Luster, MD;  Location: [facility] ENDOSCOPY;  Service: Gastroenterology;  Laterality: N/A;  . ESOPHAGOGASTRODUODENOSCOPY (EGD) WITH PROPOFOL N/A 05/29/2017   Procedure: ESOPHAGOGASTRODUODENOSCOPY (EGD) WITH PROPOFOL;  Surgeon: Lollie Sails, MD;  Location: [name] ENDOSCOPY;  Service: Endoscopy;  Laterality: N/A;  . LEFT HEART  CATH AND CORONARY ANGIOGRAPHY N/A 12/04/2016   Procedure: LEFT HEART CATH AND CORS/GRAFTS  ANGIOGRAPHY;  Surgeon: Wellington Hampshire, MD;  Location: [facility] CV LAB;  Service: Cardiovascular;  Laterality: N/A;  . LEFT HEART CATH AND CORS/GRAFTS ANGIOGRAPHY N/A 04/09/2018   Procedure: LEFT HEART CATH AND CORS/GRAFTS ANGIOGRAPHY;  Surgeon: Isaias Cowman, MD;  Location: [place] CV LAB;  Service: Cardiovascular;  Laterality: N/A;  . RIB PLATING N/A 05/25/2016   Procedure: STERNAL PLATING;  Surgeon: Melrose Nakayama, MD;  Location: [facility];  Service: Thoracic;  Laterality: N/A;  . right shoulder    . STERNAL WIRES REMOVAL N/A 03/30/2016   Procedure: STERNAL WIRES REMOVAL;  Surgeon: Melrose Nakayama, MD;  Location: [facility];  Service: Thoracic;  Laterality: N/A;  . TONSILLECTOMY      Home Medications:  Allergies as of 08/05/2018      Reactions   Contrast Media [iodinated Diagnostic Agents] Hives   Metrizamide Hives   Oxycodone Hcl Itching   Vicodin [hydrocodone-acetaminophen] Itching      Medication List       Accurate as of August 05, 2018 11:18 AM. If you have any questions, ask your nurse or doctor.        STOP taking these medications   diphenhydramine-acetaminophen 25-500 MG Tabs tablet Commonly known as: TYLENOL PM Stopped by: Abbie Sons, MD   famotidine 20 MG tablet Commonly known as: PEPCID Stopped by: Abbie Sons, MD   famotidine 40 MG tablet Commonly known as: PEPCID Stopped by: Abbie Sons, MD   predniSONE 50 MG tablet Commonly known as: DELTASONE Stopped by: Abbie Sons, MD     TAKE these medications   atorvastatin 80 MG tablet Commonly known as: LIPITOR Take 1 tablet (80 mg total) by mouth daily at 6 PM.   diphenhydrAMINE 50 MG tablet Commonly known as: BENADRYL Take 1 tablet (50 mg total) by mouth at bedtime as needed for itching.   Excedrin Back & Body 250-250 MG tablet Generic drug: Acetaminophen-Aspirin Buffered Take 1  tablet by mouth every 4 (four) hours as needed for pain.   ezetimibe 10 MG tablet Commonly known as: ZETIA Take 1 tablet (10 mg total) by mouth daily.   finasteride 5 MG tablet Commonly known as: PROSCAR Take 1 tablet (5 mg total) by mouth daily. What changed: when to take this   isosorbide mononitrate 60 MG 24 hr tablet Commonly known as: IMDUR Take 1 tablet by mouth 2 (two) times a day.   nitroGLYCERIN 0.4 MG SL tablet Commonly known as: NITROSTAT Place 1 tablet (0.4 mg total) under the tongue every 5 (five) minutes as needed for chest pain.   ranolazine 1000 MG SR tablet Commonly known as: RANEXA Take 1 tablet (1,000 mg total) by mouth 2 (two) times daily.   rivaroxaban 20 MG Tabs tablet Commonly known as: Xarelto Take 1 tablet (20 mg total) by mouth daily.  Allergies:  Allergies  Allergen Reactions  . Contrast Media [Iodinated Diagnostic Agents] Hives  . Metrizamide Hives  . Oxycodone Hcl Itching  . Vicodin [Hydrocodone-Acetaminophen] Itching    Family History: Family History  Problem Relation Age of Onset  . Heart attack Father 44       MI  . Cancer Maternal Uncle     Social History:  reports that he has quit smoking. His smoking use included cigarettes. He started smoking about 9 years ago. He has a 35.00 pack-year smoking history. He has quit using smokeless tobacco. He reports that he does not drink alcohol or use drugs.  ROS: UROLOGY Frequent Urination?: No Hard to postpone urination?: No Burning/pain with urination?: No Get up at night to urinate?: No Leakage of urine?: No Urine stream starts and stops?: No Trouble starting stream?: No Do you have to strain to urinate?: No Blood in urine?: Yes Urinary tract infection?: No Sexually transmitted disease?: No Injury to kidneys or bladder?: No Painful intercourse?: No Weak stream?: No Erection problems?: No Penile pain?: No  Gastrointestinal Nausea?: No Vomiting?: No  Indigestion/heartburn?: No Diarrhea?: Yes Constipation?: No  Constitutional Fever: No Night sweats?: No Weight loss?: No Fatigue?: No  Skin Skin rash/lesions?: No Itching?: No  Eyes Blurred vision?: No Double vision?: No  Ears/Nose/Throat Sore throat?: No Sinus problems?: Yes  Hematologic/Lymphatic Swollen glands?: No Easy bruising?: Yes  Cardiovascular Leg swelling?: No Chest pain?: Yes  Respiratory Cough?: No Shortness of breath?: Yes  Endocrine Excessive thirst?: No  Musculoskeletal Back pain?: No Joint pain?: No  Neurological Headaches?: No Dizziness?: Yes  Psychologic Depression?: No Anxiety?: No  Physical Exam: BP 105/63 (BP Location: Left Arm, Patient Position: Sitting, Cuff Size: Normal)   Pulse 67   Ht 5\' 10"  (1.778 m)   Wt 176 lb (79.8 kg)   BMI 25.25 kg/m   Constitutional:  Alert and oriented, No acute distress. HEENT: [place] AT, moist mucus membranes.  Trachea midline, no masses. Cardiovascular: No clubbing, cyanosis, or edema.  RRR Respiratory: Normal respiratory effort, no increased work of breathing.  Clear GI: Abdomen is soft, nontender, nondistended, no abdominal masses GU: No CVA tenderness Lymph: No cervical or inguinal lymphadenopathy. Skin: No rashes, bruises or suspicious lesions. Neurologic: Grossly intact, no focal deficits, moving all 4 extremities. Psychiatric: Normal mood and affect.    Assessment & Plan:   Follow-up CTU showed no abnormalities.  His hematuria most likely is combination of BPH and his anticoagulant.  Since he is having increasing hematuria I did recommend a follow-up cystoscopy.  He requested to have this done under sedation and will schedule along with possible bladder biopsy.  The procedure was cussed in detail including potential risks of bleeding, infection, urethral stricture.  He indicated all questions were answered and desires to proceed.   Abbie Sons, [place] [address] [place], [place] [address] [phone]

## 2018-08-05 NOTE — Telephone Encounter (Signed)
Patient was given the [facility] Information form below as well as the Instructions for Pre-Admission Testing form & a map of [facility].    [place], [place], [place] [address] Telephone: [phone] Fax: [phone]   Thank you for choosing [facility] for your upcoming surgery!  We are always here to assist in your urological needs.  Please read the following information with specific details for your upcoming appointments related to your surgery. Please contact Leah at [phone] with any questions.  The Name of Your Surgery: Cystoscopy Possible bladder Biopsy Your Surgery Date: 08/14/18 Your Surgeon: John Giovanni  Please call Same Day Surgery at [phone] between the hours of 1pm-3pm one day prior to your surgery. They will inform you of the time to arrive at Same Day Surgery which is located on the second floor of the [facility].   Please refer to the attached letter regarding instructions for Pre-Admission Testing. You will receive a call from the [facility] office regarding your appointment with them.  The Pre-Admission Testing office is located at [place], on the first floor of the [facility] in [place] (office is to the right as you enter through the [employer] of the [facility]). Please have all medications you are currently taking and your insurance card available.  A COVID-19 test will be required prior to surgery on 08/09/2018 and once test is performed you will need to remain in quarantine until the day of surgery. Patient was advised to have nothing to eat or drink after midnight the night prior to surgery except that he may have only water until 2 hours before surgery with nothing to drink within 2 hours of surgery.  The patient states he currently takes Xarelto & was  informed to hold medication for 3 days prior to surgery beginning on 08/11/18 pending Dr. Saralyn Pilar clearance. Patient's questions were answered and he expressed understanding of these instructions.

## 2018-08-05 NOTE — Progress Notes (Signed)
08/05/2018 11:18 AM   Scott [name] 1940/05/20 [id number]  Referring provider: Baxter Hire, MD [place],  [place] [address]  Chief Complaint  Patient presents with  . Hematuria  . Results    HPI: 78 year old male presents for follow-up of gross hematuria.  He was initially seen January 2020 with a 6-week history of intermittent gross hematuria.  He is on aspirin and Xarelto.  CT urogram was felt to show septal thickening of the wall of the left renal pelvis.  Cystoscopy was remarkable for moderate lateral lobe enlargement with prominent hypervascularity.  No bladder mucosal lesions were identified.  It was recommended repeating his CT urogram in 4 months.  He continues to have intermittent gross hematuria and over the past 2 weeks notes blood in his urine on a daily basis.  He denies flank/abdominal/pelvic pain or dysuria.    PMH: Past Medical History:  Diagnosis Date  . Arthritis    right hip, since a fall  . CAD (coronary artery disease)    a. 04/2012 Cath: 3VD->Med Rx;  b. 04/2013 PCI RCA (2 DES); c. 03/2014 PCI: LAD 80 (3.0x23 Xience Alpine DES); d. 06/2014 CABG x 2 ([facility]) LIMA->LAD, VG->OM; e. 12/2014 Cath ([facility]): patent grafts->Med Rx; f. 08/2015 Cath: patent grafts; g. 11/2016 Cath: LM min irregs, LAD 20p, 34m, LCX 100p/m, RCA 20ost, 10p/m/d, VG->OM1 nl, LIMA->dLAD nl; f. 07/2017 MV: No isch, EF 51%.  . Cardiomyopathy, ischemic    a. 04/2012 Echo: EF 40-45%;  b. 08/2013 Echo: EF 45-50%; c. 01/2015 Echo: EF 40-45%; d. 07/2016 Echo: EF 60-65%; e. 09/2017 Echo: EF 55-60%, Gr1 DD.  . Carotid arterial disease ([place])    a. 05/2013 Carotid U/S; bilat 40-50% ICA stenosis.  . Chronic Chest Pain   . Chronic systolic CHF (congestive heart failure) ([place])    a. 01/2015 Echo: EF 40-45%; b. 07/2016 Echo: EF 60-65%; c. 09/2017 Echo: EF 55-60%, Gr1 DD, nl RV fxn.  . Cough   . Diverticulosis   . Headache 1970's   migraine  . History of blood transfusion 2016   post op  .  Hyperlipidemia   . Hypertension   . Iron deficiency anemia due to chronic blood loss 09/11/2016  . Myocardial infarction [facility])    unsure of when  . Pulmonary emboli ([place]) 07/2016   a. On Xarelto.  . Reflux esophagitis   . Sternal pain    a. 03/2016 s/p Sternal wire removal; b. 05/2016 s/p redo median sternotomy for sternal debridement and sternal plating.  . Tubular adenoma of colon     Surgical History: Past Surgical History:  Procedure Laterality Date  . CARDIAC CATHETERIZATION  05/01/2012  . CARDIAC CATHETERIZATION  [facility]  . C[facility]IAC CAT[facility]IZATION  01/11/15    [facility]  . CARDIAC CATHETERIZATION N/A 09/16/2015   Procedure: LEFT HEART CATH AND CORS/GRAFTS ANGIOGRAPHY;  Surgeon: Minna Merritts, MD;  Location: [place] CV LAB;  Service: Cardiovascular;  Laterality: N/A;  . CARPAL TUNNEL RELEASE     right hand  . CATARACT EXTRACTION     LEFT  . CATARACT EXTRACTION W/PHACO Left 09/16/2014   Procedure: CATARACT EXTRACTION PHACO AND INTRAOCULAR LENS PLACEMENT (IOC);  Surgeon: Leandrew Koyanagi, MD;  Location: [facility];  Service: Ophthalmology;  Laterality: Left;  . COLONOSCOPY    . COLONOSCOPY WITH PROPOFOL N/A 01/28/2016   Procedure: COLONOSCOPY WITH PROPOFOL;  Surgeon: Lollie Sails, MD;  Location: [facility] ENDOSCOPY;  Service: Endoscopy;  Laterality: N/A;  .  CORONARY ANGIOPLASTY WITH STENT PLACEMENT  04/13/2014  . CORONARY ARTERY BYPASS GRAFT  06-26-14   x3 bypasses  . DE QUERVAIN'S RELEASE Left 08/22/2012  . ESOPHAGOGASTRODUODENOSCOPY (EGD) WITH PROPOFOL N/A 04/26/2015   Procedure: ESOPHAGOGASTRODUODENOSCOPY (EGD) WITH PROPOFOL;  Surgeon: Hulen Luster, MD;  Location: [facility] ENDOSCOPY;  Service: Gastroenterology;  Laterality: N/A;  . ESOPHAGOGASTRODUODENOSCOPY (EGD) WITH PROPOFOL N/A 05/29/2017   Procedure: ESOPHAGOGASTRODUODENOSCOPY (EGD) WITH PROPOFOL;  Surgeon: Lollie Sails, MD;  Location: [facility] ENDOSCOPY;  Service: Endoscopy;  Laterality: N/A;  . LEFT HEART  CATH AND CORONARY ANGIOGRAPHY N/A 12/04/2016   Procedure: LEFT HEART CATH AND CORS/GRAFTS  ANGIOGRAPHY;  Surgeon: Wellington Hampshire, MD;  Location: [place] CV LAB;  Service: Cardiovascular;  Laterality: N/A;  . LEFT HEART CATH AND CORS/GRAFTS ANGIOGRAPHY N/A 04/09/2018   Procedure: LEFT HEART CATH AND CORS/GRAFTS ANGIOGRAPHY;  Surgeon: Isaias Cowman, MD;  Location: [place] CV LAB;  Service: Cardiovascular;  Laterality: N/A;  . RIB PLATING N/A 05/25/2016   Procedure: STERNAL PLATING;  Surgeon: Melrose Nakayama, MD;  Location: [facility];  Service: Thoracic;  Laterality: N/A;  . right shoulder    . STERNAL WIRES REMOVAL N/A 03/30/2016   Procedure: STERNAL WIRES REMOVAL;  Surgeon: Melrose Nakayama, MD;  Location: [facility];  Service: Thoracic;  Laterality: N/A;  . TONSILLECTOMY      Home Medications:  Allergies as of 08/05/2018      Reactions   Contrast Media [iodinated Diagnostic Agents] Hives   Metrizamide Hives   Oxycodone Hcl Itching   Vicodin [hydrocodone-acetaminophen] Itching      Medication List       Accurate as of August 05, 2018 11:18 AM. If you have any questions, ask your nurse or doctor.        STOP taking these medications   diphenhydramine-acetaminophen 25-500 MG Tabs tablet Commonly known as: TYLENOL PM Stopped by: Abbie Sons, MD   famotidine 20 MG tablet Commonly known as: PEPCID Stopped by: Abbie Sons, MD   famotidine 40 MG tablet Commonly known as: PEPCID Stopped by: Abbie Sons, MD   predniSONE 50 MG tablet Commonly known as: DELTASONE Stopped by: Abbie Sons, MD     TAKE these medications   atorvastatin 80 MG tablet Commonly known as: LIPITOR Take 1 tablet (80 mg total) by mouth daily at 6 PM.   diphenhydrAMINE 50 MG tablet Commonly known as: BENADRYL Take 1 tablet (50 mg total) by mouth at bedtime as needed for itching.   Excedrin Back & Body 250-250 MG tablet Generic drug: Acetaminophen-Aspirin Buffered Take 1  tablet by mouth every 4 (four) hours as needed for pain.   ezetimibe 10 MG tablet Commonly known as: ZETIA Take 1 tablet (10 mg total) by mouth daily.   finasteride 5 MG tablet Commonly known as: PROSCAR Take 1 tablet (5 mg total) by mouth daily. What changed: when to take this   isosorbide mononitrate 60 MG 24 hr tablet Commonly known as: IMDUR Take 1 tablet by mouth 2 (two) times a day.   nitroGLYCERIN 0.4 MG SL tablet Commonly known as: NITROSTAT Place 1 tablet (0.4 mg total) under the tongue every 5 (five) minutes as needed for chest pain.   ranolazine 1000 MG SR tablet Commonly known as: RANEXA Take 1 tablet (1,000 mg total) by mouth 2 (two) times daily.   rivaroxaban 20 MG Tabs tablet Commonly known as: Xarelto Take 1 tablet (20 mg total) by mouth daily.  Allergies:  Allergies  Allergen Reactions  . Contrast Media [Iodinated Diagnostic Agents] Hives  . Metrizamide Hives  . Oxycodone Hcl Itching  . Vicodin [Hydrocodone-Acetaminophen] Itching    Family History: Family History  Problem Relation Age of Onset  . Heart attack Father 53       MI  . Cancer Maternal Uncle     Social History:  reports that he has quit smoking. His smoking use included cigarettes. He started smoking about 9 years ago. He has a 35.00 pack-year smoking history. He has quit using smokeless tobacco. He reports that he does not drink alcohol or use drugs.  ROS: UROLOGY Frequent Urination?: No Hard to postpone urination?: No Burning/pain with urination?: No Get up at night to urinate?: No Leakage of urine?: No Urine stream starts and stops?: No Trouble starting stream?: No Do you have to strain to urinate?: No Blood in urine?: Yes Urinary tract infection?: No Sexually transmitted disease?: No Injury to kidneys or bladder?: No Painful intercourse?: No Weak stream?: No Erection problems?: No Penile pain?: No  Gastrointestinal Nausea?: No Vomiting?: No  Indigestion/heartburn?: No Diarrhea?: Yes Constipation?: No  Constitutional Fever: No Night sweats?: No Weight loss?: No Fatigue?: No  Skin Skin rash/lesions?: No Itching?: No  Eyes Blurred vision?: No Double vision?: No  Ears/Nose/Throat Sore throat?: No Sinus problems?: Yes  Hematologic/Lymphatic Swollen glands?: No Easy bruising?: Yes  Cardiovascular Leg swelling?: No Chest pain?: Yes  Respiratory Cough?: No Shortness of breath?: Yes  Endocrine Excessive thirst?: No  Musculoskeletal Back pain?: No Joint pain?: No  Neurological Headaches?: No Dizziness?: Yes  Psychologic Depression?: No Anxiety?: No  Physical Exam: BP 105/63 (BP Location: Left Arm, Patient Position: Sitting, Cuff Size: Normal)   Pulse 67   Ht 5\' 10"  (1.778 m)   Wt 176 lb (79.8 kg)   BMI 25.25 kg/m   Constitutional:  Alert and oriented, No acute distress. HEENT: [place] AT, moist mucus membranes.  Trachea midline, no masses. Cardiovascular: No clubbing, cyanosis, or edema.  RRR Respiratory: Normal respiratory effort, no increased work of breathing.  Clear GI: Abdomen is soft, nontender, nondistended, no abdominal masses GU: No CVA tenderness Lymph: No cervical or inguinal lymphadenopathy. Skin: No rashes, bruises or suspicious lesions. Neurologic: Grossly intact, no focal deficits, moving all 4 extremities. Psychiatric: Normal mood and affect.    Assessment & Plan:   Follow-up CTU showed no abnormalities.  His hematuria most likely is combination of BPH and his anticoagulant.  Since he is having increasing hematuria I did recommend a follow-up cystoscopy.  He requested to have this done under sedation and will schedule along with possible bladder biopsy.  The procedure was cussed in detail including potential risks of bleeding, infection, urethral stricture.  He indicated all questions were answered and desires to proceed.   Abbie Sons, [place] [address] [place], [place] [address] [phone]

## 2018-08-06 ENCOUNTER — Other Ambulatory Visit: Payer: Medicare HMO

## 2018-08-06 DIAGNOSIS — R310 Gross hematuria: Secondary | ICD-10-CM

## 2018-08-06 DIAGNOSIS — Z01818 Encounter for other preprocedural examination: Secondary | ICD-10-CM

## 2018-08-08 ENCOUNTER — Encounter
Admission: RE | Admit: 2018-08-08 | Discharge: 2018-08-08 | Disposition: A | Discharge status: Home / Self Care | Payer: Medicare HMO | Source: Ambulatory Visit | Attending: Urology | Admitting: Urology

## 2018-08-08 ENCOUNTER — Other Ambulatory Visit: Payer: Self-pay

## 2018-08-08 DIAGNOSIS — Z1159 Encounter for screening for other viral diseases: Secondary | ICD-10-CM | POA: Insufficient documentation

## 2018-08-08 DIAGNOSIS — Z01812 Encounter for preprocedural laboratory examination: Secondary | ICD-10-CM | POA: Insufficient documentation

## 2018-08-08 HISTORY — DX: Dizziness and giddiness: R42

## 2018-08-08 LAB — CULTURE, URINE COMPREHENSIVE

## 2018-08-08 LAB — BASIC METABOLIC PANEL
Anion gap: 7 (ref 5–15)
BUN: 25 mg/dL — ABNORMAL HIGH (ref 8–23)
CO2: 25 mmol/L (ref 22–32)
Calcium: 8.5 mg/dL — ABNORMAL LOW (ref 8.9–10.3)
Chloride: 105 mmol/L (ref 98–111)
Creatinine, Ser: 1.24 mg/dL (ref 0.61–1.24)
GFR calc Af Amer: >60 mL/min (ref >60)
GFR calc non Af Amer: 56 mL/min — ABNORMAL LOW (ref >60)
Glucose, Bld: 93 mg/dL (ref 70–99)
Potassium: 4.5 mmol/L (ref 3.5–5.1)
Sodium: 137 mmol/L (ref 135–145)

## 2018-08-08 NOTE — Pre-Procedure Instructions (Signed)
Progress Notes - documented in this encounter Isaias Cowman, MD - 06/03/2018 9:15 AM EDT Formatting of this note might be different from the original. Established Patient Visit   Chief Complaint: Chief Complaint  Patient presents with  . Follow-up  3 week  . Chest Pain  still has even with increase in imdur-tight,heavy,stabbing and pressure-taking nitro  . Shortness of Breath  with excertion  Date of Service: 06/03/2018 Date of Birth: 1941-01-06 PCP: Velna Ochs, MD  History of Present Illness: Mr. College is a 78 y.o.male patient who returns for  1. Status post stent RCA/20/2015 2. Status post DES to mid LAD 04/13/2014 3. Staus post CABG x 2, LIMA to LAD, SVG to ramus, 06/2014 4. Ischemic cardiomyopathy 5. Paroxysmal atrial fibrillation 6. History of pulmonary embolus 7. Patent stents, patent grafts 12/04/2016 8. Patent stents, patent SVG, atretic LIMA 04/09/2018 9. Sinus bradycardia  Patient recently seen for recurrent episodes of chest pain. ETT Myoview 03/16/2018 revealed fixed basal inferior, inferolateral and lateral wall defects with LVEF 29%. Patient underwent cardiac catheterization 04/09/2018 which revealed 50% stenosis ostium to mid left main, 40% stenosis proximal LAD, occluded left circumflex, stent RCA, patent SVG to ramus, and small atretic LIMA to LAD with competitive flow. Left ventriculography revealed LV ejection fraction of 45%.  The patient was recently seen 05/13/2018 which time he complained of intermittent episodes of chest pain. Isosorbide mononitrate was increased to 60 mg twice daily. He returns today, still complains of substernal chest pain, described as tightness or pressure, which occurs with and without exertion, relieved with sublingual nitroglycerin. He experiences 2 episodes per week. He is able to work outdoors without difficulty.  Patient has paroxysmal atrial fibrillation, on Xarelto for stroke prevention, which is tolerated well, without  bleeding side effects.  Patient has hyperlipidemia, on atorvastatin, which is well-tolerated without apparent side effects, followed by his primary care provider. The patient follows a low-cholesterol, low-fat diet.  Past Medical and Surgical History  Past Medical History Past Medical History:  Diagnosis Date  . Coronary artery disease  With occluded proximal left circumflex, 50% stenosis proximal LAD, and sequential 95% and 80% stenoses distal RCA.  . Diverticulosis 01/28/2016  . History of chickenpox  . History of colon polyps  . Internal hemorrhoids 01/28/2016  . Ischemic cardiomyopathy  . Reflux esophagitis 04/26/2015  . Tubular adenoma of colon, unspecified 01/28/2016   Past Surgical History He has a past surgical history that includes Right shoulder arthroscopy with subacromial decompression, open repair rotator cuff 03/14/2005; Left foot pinning 12/2007; Left de Quervain's release 08/22/2012; Right carpal tunnel release; Left carpal tunnel release 08/11/2011; Tonsillectomy; cardiac cath with 3 stents placed (N/A, 04/2013); Colonoscopy (08/30/2010); Colonoscopy (05/02/2004); Colonoscopy (11/22/2000); egd (08/30/2010, 02/02/2005); CABG x2 with LIMA (06/26/2014); coronary artery bypass w/single artery graft (N/A, 06/26/2014); coronary artery bypass w/venous & arterial grafts (N/A, 06/26/2014); egd (04/26/2015); Colonoscopy (01/28/2016); and egd (05/29/2017).   Medications and Allergies  Current Medications  Current Outpatient Medications  Medication Sig Dispense Refill  . aspirin 81 MG EC tablet Take 1 tablet (81 mg total) by mouth once daily  . atorvastatin (LIPITOR) 80 MG tablet Take 1 tablet (80 mg total) by mouth nightly 90 tablet 3  . diphenhydrAMINE-acetaminophen (TYLENOL PM) 25-500 mg per tablet Take by mouth  . ezetimibe (ZETIA) 10 mg tablet Take 10 mg by mouth once daily  . isosorbide mononitrate (IMDUR) 60 MG ER tablet Take 1 tablet (60 mg total) by mouth 2 (two) times daily 60  tablet 11  .  lidocaine (XYLOCAINE) 2 % jelly Place into the urethra  . nitroGLYcerin (NITROSTAT) 0.4 MG SL tablet Place under the tongue  . ranolazine (RANEXA) 1,000 mg ER tablet  . rivaroxaban (XARELTO) 20 mg tablet Take 20 mg by mouth daily with breakfast   No current facility-administered medications for this visit.   Allergies: Metrizamide; Oxycodone; Iodinated contrast media; Tramadol; Hydrocodone-acetaminophen; and Oxycodone hcl  Social and Family History  Social History reports that he quit smoking about 10 years ago. His smoking use included cigarettes and cigars. He has a 40.00 pack-year smoking history. He quit smokeless tobacco use about 13 years ago. He reports that he does not drink alcohol or use drugs.  Family History Family History  Problem Relation Age of Onset  . Myocardial Infarction (Heart attack) Father  . Brain cancer Other  Uncle  . Prostate cancer Other  Uncle  . Colon cancer Maternal Uncle   Review of Systems   Review of Systems: The patient reports torsional and nonexertional chest pain, with shortness of breath, without orthopnea, paroxysmal nocturnal dyspnea, pedal edema, palpitations, heart racing, presyncope, syncope. Review of 12 Systems is negative except as described above.  Physical Examination   Vitals:BP 118/60  Pulse 53  Ht 177.8 cm (5\' 10" )  Wt 80.9 kg (178 lb 6.4 oz)  SpO2 99%  BMI 25.60 kg/m  Ht:177.8 cm (5\' 10" ) Wt:80.9 kg (178 lb 6.4 oz) EXH:BZJI surface area is 2 meters squared. Body mass index is 25.6 kg/m.  HEENT: Pupils equally reactive to light and accomodation  Neck: Supple without thyromegaly, carotid pulses 2+ Lungs: clear to auscultation bilaterally; no wheezes, rales, rhonchi Heart: Regular rate and rhythm. No gallops, murmurs or rub Abdomen: soft nontender, nondistended, with normal bowel sounds Extremities: no cyanosis, clubbing, or edema Peripheral Pulses: 2+ in all extremities, 2+ femoral pulses  bilaterally  Assessment   78 y.o. male with  1. Essential hypertension  2. Paroxysmal A-fib (CMS-HCC)  3. Chronic diastolic heart failure (CMS-HCC)  4. Coronary artery disease involving native coronary artery of native heart with angina pectoris with documented spasm (CMS-HCC)  5. Protein S deficiency (CMS-HCC)  6. Mixed hyperlipidemia  7. S/P CABG x 82   78 year old with multiple stents, status post CABG x2, with residual 50% stenosis ostium left main, with atretic LIMA to LAD, occluded left circumflex, patent stent RCA, patent SVG to ramus, with recurrent episodes of chest pain, with typical and atypical features. The patient is on maximal isosorbide mononitrate and ranolazine. Beta-blocker deferred in light of patient's baseline bradycardia. The patient has paroxysmal atrial fibrillation, on Xarelto for stroke prevention. The patient has essential hypertension, blood pressure well controlled on current BP medications.  Plan   1. Continue current medications 2. Counseled patient about low-sodium diet 3. DASH diet printed instructions given to the patient 4. Counseled patient about low-cholesterol diet 5. Continue atorvastatin for hyperlipidemia adjuvant 6. Low-fat and cholesterol diet printed instructions given to the patient 7. Continue Xarelto for stroke prevention 8. Return to clinic for follow-up in 3 months  No orders of the defined types were placed in this encounter.  Return in about 3 months (around 09/03/2018).  Isaias Cowman, MD PhD [facility]    Electronically signed by Isaias Cowman, MD at 06/03/2018 9:33 AM EDT

## 2018-08-08 NOTE — Patient Instructions (Signed)
Your procedure is scheduled on: 08-14-18 [facility] Report to Same Day Surgery 2nd floor medical mall [facility] Entrance-take elevator on left to 2nd floor.  Check in with surgery information desk.) To find out your arrival time please call [phone] between 1PM - 3PM on 08-13-18 TUESDAY  Remember: Instructions that are not followed completely may result in serious medical risk, up to and including death, or upon the discretion of your surgeon and anesthesiologist your surgery may need to be rescheduled.    _x___ 1. Do not eat food after midnight the night before your procedure. NO GUM OR CANDY AFTER MIDNIGHT. You may drink clear liquids up to 2 hours before you are scheduled to arrive at the hospital for your procedure.  Do not drink clear liquids within 2 hours of your scheduled arrival to the hospital.  Clear liquids include  --Water or Apple juice without pulp  --Clear carbohydrate beverage such as ClearFast or Gatorade  --Black Coffee or Clear Tea (No milk, no creamers, do not add anything to the coffee or Tea   ____Ensure clear carbohydrate drink on the way to the hospital for bariatric patients  ____Ensure clear carbohydrate drink 3 hours before surgery for Dr Dwyane Luo patients if physician instructed.     __x__ 2. No Alcohol for 24 hours before or after surgery.   __x__3. No Smoking or e-cigarettes for 24 prior to surgery.  Do not use any chewable tobacco products for at least 6 hour prior to surgery   ____  4. Bring all medications with you on the day of surgery if instructed.    __x__ 5. Notify your doctor if there is any change in your medical condition     (cold, fever, infections).    x___6. On the morning of surgery brush your teeth with toothpaste and water.  You may rinse your mouth with mouth wash if you wish.  Do not swallow any toothpaste or mouthwash.   Do not wear jewelry, make-up, hairpins, clips or nail polish.  Do not wear lotions, powders, or perfumes.  You may wear deodorant.  Do not shave 48 hours prior to surgery. Men may shave face and neck.  Do not bring valuables to the hospital.    [facility] is not responsible for any belongings or valuables.               Contacts, dentures or bridgework may not be worn into surgery.  Leave your suitcase in the car. After surgery it may be brought to your room.  For patients admitted to the hospital, discharge time is determined by your treatment team.  _  Patients discharged the day of surgery will not be allowed to drive home.  You will need someone to drive you home and stay with you the night of your procedure.    Please read over the following fact sheets that you were given:   [facility] Preparing for Surgery   _x___ TAKE THE FOLLOWING MEDICATION THE MORNING OF SURGERY WITH A SMALL SIP OF WATER. These include:  1. ISOSORBIDE (IMDUR)  2. RANEXA (RANOLAZINE)  3.  4.  5.  6.  ____Fleets enema or Magnesium Citrate as directed.   ____ Use CHG Soap or sage wipes as directed on instruction sheet   ____ Use inhalers on the day of surgery and bring to hospital day of surgery  ____ Stop Metformin and Janumet 2 days prior to surgery.    ____ Take 1/2 of usual insulin dose the  night before surgery and none on the morning surgery.   _x___ Follow recommendations from Cardiologist, Pulmonologist or PCP regarding stopping Aspirin, Coumadin, Plavix ,Eliquis, Effient, or Pradaxa, and Pletal-INSTRUCTED BY DR PARASCHOS TO STOP XARELTO 3 DAYS PRIOR TO SURGERY  X____Stop Anti-inflammatories such as Advil, Aleve, Ibuprofen, Motrin, Naproxen, Naprosyn, Goodies powders or aspirin products NOW-OK to take Tylenol    ____ Stop supplements until after surgery.    ____ Bring C-Pap to the hospital.

## 2018-08-09 ENCOUNTER — Other Ambulatory Visit
Admission: RE | Admit: 2018-08-09 | Discharge: 2018-08-09 | Disposition: A | Discharge status: Home / Self Care | Payer: Medicare HMO | Source: Ambulatory Visit | Attending: Urology | Admitting: Urology

## 2018-08-09 DIAGNOSIS — Z01812 Encounter for preprocedural laboratory examination: Secondary | ICD-10-CM | POA: Diagnosis not present

## 2018-08-09 LAB — SARS CORONAVIRUS 2 (TAT 6-24 HRS): SARS Coronavirus 2: NEGATIVE

## 2018-08-13 ENCOUNTER — Telehealth: Payer: Self-pay | Admitting: Radiology

## 2018-08-13 ENCOUNTER — Other Ambulatory Visit: Payer: Self-pay | Admitting: Radiology

## 2018-08-13 DIAGNOSIS — R310 Gross hematuria: Secondary | ICD-10-CM

## 2018-08-13 DIAGNOSIS — N401 Enlarged prostate with lower urinary tract symptoms: Secondary | ICD-10-CM

## 2018-08-13 MED ORDER — SODIUM CHLORIDE 0.9 % IV SOLN
2.0000 g | INTRAVENOUS | Status: AC
  Administered 2018-08-14: 2 g via INTRAVENOUS
  Filled 2018-08-13: qty 2

## 2018-08-13 NOTE — Pre-Procedure Instructions (Signed)
Cardiac clearance on chart from dr Saralyn Pilar- low risk

## 2018-08-13 NOTE — Telephone Encounter (Signed)
preoperative antibiotics changed to ampicillin 2 g

## 2018-08-13 NOTE — Telephone Encounter (Signed)
-----   Message from Abbie Sons, MD sent at 08/12/2018  9:27 PM EDT ----- Urine culture grew a low level of strep. Please change preoperative antibiotics to ampicillin 2 g

## 2018-08-14 ENCOUNTER — Ambulatory Visit
Admission: RE | Admit: 2018-08-14 | Discharge: 2018-08-14 | Disposition: A | Discharge status: Home / Self Care | Payer: Medicare HMO | Attending: Urology | Admitting: Urology

## 2018-08-14 ENCOUNTER — Other Ambulatory Visit: Payer: Self-pay

## 2018-08-14 ENCOUNTER — Encounter
Admission: RE | Disposition: A | Discharge status: Home / Self Care | Payer: Self-pay | Source: Home / Self Care | Attending: Urology

## 2018-08-14 ENCOUNTER — Encounter: Payer: Self-pay | Admitting: *Deleted

## 2018-08-14 ENCOUNTER — Ambulatory Visit: Payer: Medicare HMO | Admitting: Certified Registered"

## 2018-08-14 DIAGNOSIS — N401 Enlarged prostate with lower urinary tract symptoms: Secondary | ICD-10-CM

## 2018-08-14 DIAGNOSIS — Z951 Presence of aortocoronary bypass graft: Secondary | ICD-10-CM | POA: Diagnosis not present

## 2018-08-14 DIAGNOSIS — I255 Ischemic cardiomyopathy: Secondary | ICD-10-CM | POA: Diagnosis not present

## 2018-08-14 DIAGNOSIS — Z79899 Other long term (current) drug therapy: Secondary | ICD-10-CM | POA: Insufficient documentation

## 2018-08-14 DIAGNOSIS — I2510 Atherosclerotic heart disease of native coronary artery without angina pectoris: Secondary | ICD-10-CM | POA: Insufficient documentation

## 2018-08-14 DIAGNOSIS — I5022 Chronic systolic (congestive) heart failure: Secondary | ICD-10-CM | POA: Diagnosis not present

## 2018-08-14 DIAGNOSIS — N35912 Unspecified bulbous urethral stricture, male: Secondary | ICD-10-CM | POA: Diagnosis not present

## 2018-08-14 DIAGNOSIS — Z7982 Long term (current) use of aspirin: Secondary | ICD-10-CM | POA: Diagnosis not present

## 2018-08-14 DIAGNOSIS — E785 Hyperlipidemia, unspecified: Secondary | ICD-10-CM | POA: Diagnosis not present

## 2018-08-14 DIAGNOSIS — N400 Enlarged prostate without lower urinary tract symptoms: Secondary | ICD-10-CM

## 2018-08-14 DIAGNOSIS — I252 Old myocardial infarction: Secondary | ICD-10-CM | POA: Insufficient documentation

## 2018-08-14 DIAGNOSIS — I110 Hypertensive heart disease with heart failure: Secondary | ICD-10-CM | POA: Diagnosis not present

## 2018-08-14 DIAGNOSIS — Z87891 Personal history of nicotine dependence: Secondary | ICD-10-CM | POA: Insufficient documentation

## 2018-08-14 DIAGNOSIS — R310 Gross hematuria: Secondary | ICD-10-CM

## 2018-08-14 DIAGNOSIS — Z7901 Long term (current) use of anticoagulants: Secondary | ICD-10-CM | POA: Diagnosis not present

## 2018-08-14 DIAGNOSIS — Z955 Presence of coronary angioplasty implant and graft: Secondary | ICD-10-CM | POA: Diagnosis not present

## 2018-08-14 DIAGNOSIS — Z86711 Personal history of pulmonary embolism: Secondary | ICD-10-CM | POA: Insufficient documentation

## 2018-08-14 HISTORY — PX: CYSTOSCOPY: SHX5120

## 2018-08-14 SURGERY — CYSTOSCOPY
Anesthesia: General | Site: Bladder

## 2018-08-14 MED ORDER — ONDANSETRON HCL 4 MG/2ML IJ SOLN: 4.0000 mg | Freq: Once | INTRAMUSCULAR | Status: DC | PRN

## 2018-08-14 MED ORDER — ONDANSETRON HCL 4 MG/2ML IJ SOLN
INTRAMUSCULAR | Status: DC | PRN
  Administered 2018-08-14: 4 mg via INTRAVENOUS

## 2018-08-14 MED ORDER — LIDOCAINE HCL (PF) 2 % IJ SOLN
INTRAMUSCULAR | Status: AC
  Filled 2018-08-14: qty 10

## 2018-08-14 MED ORDER — FENTANYL CITRATE (PF) 100 MCG/2ML IJ SOLN
INTRAMUSCULAR | Status: AC
  Filled 2018-08-14: qty 2

## 2018-08-14 MED ORDER — EPHEDRINE SULFATE 50 MG/ML IJ SOLN
INTRAMUSCULAR | Status: AC
  Filled 2018-08-14: qty 1

## 2018-08-14 MED ORDER — PROPOFOL 10 MG/ML IV BOLUS
INTRAVENOUS | Status: DC | PRN
  Administered 2018-08-14: 100 mg via INTRAVENOUS
  Administered 2018-08-14: 50 mg via INTRAVENOUS

## 2018-08-14 MED ORDER — LIDOCAINE HCL (CARDIAC) PF 100 MG/5ML IV SOSY
PREFILLED_SYRINGE | INTRAVENOUS | Status: DC | PRN
  Administered 2018-08-14: 100 mg via INTRAVENOUS

## 2018-08-14 MED ORDER — LACTATED RINGERS IV SOLN
INTRAVENOUS | Status: DC
  Administered 2018-08-14: 13:00:00 via INTRAVENOUS

## 2018-08-14 MED ORDER — FENTANYL CITRATE (PF) 100 MCG/2ML IJ SOLN: 25.0000 ug | INTRAMUSCULAR | Status: DC | PRN

## 2018-08-14 MED ORDER — FAMOTIDINE 20 MG PO TABS
ORAL_TABLET | ORAL | Status: AC
  Administered 2018-08-14: 20 mg via ORAL
  Filled 2018-08-14: qty 1

## 2018-08-14 MED ORDER — FAMOTIDINE 20 MG PO TABS
20.0000 mg | ORAL_TABLET | Freq: Once | ORAL | Status: AC
  Administered 2018-08-14: 20 mg via ORAL

## 2018-08-14 MED ORDER — PHENYLEPHRINE HCL (PRESSORS) 10 MG/ML IV SOLN
INTRAVENOUS | Status: DC | PRN
  Administered 2018-08-14 (×2): 100 ug via INTRAVENOUS

## 2018-08-14 MED ORDER — EPHEDRINE SULFATE 50 MG/ML IJ SOLN
INTRAMUSCULAR | Status: DC | PRN
  Administered 2018-08-14 (×3): 5 mg via INTRAVENOUS

## 2018-08-14 MED ORDER — ONDANSETRON HCL 4 MG/2ML IJ SOLN
INTRAMUSCULAR | Status: AC
  Filled 2018-08-14: qty 2

## 2018-08-14 MED ORDER — PHENYLEPHRINE HCL (PRESSORS) 10 MG/ML IV SOLN
INTRAVENOUS | Status: AC
  Filled 2018-08-14: qty 1

## 2018-08-14 MED ORDER — FENTANYL CITRATE (PF) 100 MCG/2ML IJ SOLN
INTRAMUSCULAR | Status: DC | PRN
  Administered 2018-08-14: 50 ug via INTRAVENOUS

## 2018-08-14 MED ORDER — DEXAMETHASONE SODIUM PHOSPHATE 10 MG/ML IJ SOLN
INTRAMUSCULAR | Status: AC
  Filled 2018-08-14: qty 1

## 2018-08-14 MED ORDER — DEXAMETHASONE SODIUM PHOSPHATE 10 MG/ML IJ SOLN
INTRAMUSCULAR | Status: DC | PRN
  Administered 2018-08-14: 5 mg via INTRAVENOUS

## 2018-08-14 SURGICAL SUPPLY — 20 items
BAG DRAIN CYSTO-URO LG1000N (MISCELLANEOUS) ×3 IMPLANT
BRUSH SCRUB EZ  4% CHG (MISCELLANEOUS) ×1
BRUSH SCRUB EZ 4% CHG (MISCELLANEOUS) ×2 IMPLANT
COVER WAND RF STERILE (DRAPES) ×3 IMPLANT
DRSG TELFA 4X3 1S NADH ST (GAUZE/BANDAGES/DRESSINGS) ×3 IMPLANT
ELECT REM PT RETURN 9FT ADLT (ELECTROSURGICAL) ×3
ELECTRODE REM PT RTRN 9FT ADLT (ELECTROSURGICAL) ×2 IMPLANT
GLOVE BIO SURGEON STRL SZ8 (GLOVE) ×3 IMPLANT
GOWN STRL REUS W/ TWL LRG LVL3 (GOWN DISPOSABLE) ×4 IMPLANT
GOWN STRL REUS W/ TWL XL LVL3 (GOWN DISPOSABLE) ×2 IMPLANT
GOWN STRL REUS W/TWL LRG LVL3 (GOWN DISPOSABLE) ×2
GOWN STRL REUS W/TWL XL LVL3 (GOWN DISPOSABLE) ×1
KIT TURNOVER CYSTO (KITS) ×3 IMPLANT
NDL SAFETY ECLIPSE 18X1.5 (NEEDLE) ×2 IMPLANT
NEEDLE HYPO 18GX1.5 SHARP (NEEDLE) ×1
PACK CYSTO AR (MISCELLANEOUS) ×3 IMPLANT
SET CYSTO W/LG BORE CLAMP LF (SET/KITS/TRAYS/PACK) ×3 IMPLANT
SURGILUBE 2OZ TUBE FLIPTOP (MISCELLANEOUS) ×3 IMPLANT
WATER STERILE IRR 1000ML POUR (IV SOLUTION) ×3 IMPLANT
WATER STERILE IRR 3000ML UROMA (IV SOLUTION) ×3 IMPLANT

## 2018-08-14 NOTE — Anesthesia Procedure Notes (Signed)
Procedure Name: LMA Insertion Date/Time: 08/14/2018 3:47 PM Performed by: Lavone Orn, CRNA Pre-anesthesia Checklist: Patient identified, Emergency Drugs available, Suction available, Patient being monitored and Timeout performed Patient Re-evaluated:Patient Re-evaluated prior to induction Oxygen Delivery Method: Circle system utilized Preoxygenation: Pre-oxygenation with 100% oxygen Induction Type: IV induction Ventilation: Mask ventilation without difficulty LMA: LMA inserted LMA Size: 4.0 Number of attempts: 1 Placement Confirmation: positive ETCO2 and breath sounds checked- equal and bilateral Tube secured with: Tape Dental Injury: Teeth and Oropharynx as per pre-operative assessment

## 2018-08-14 NOTE — Discharge Instructions (Signed)
AMBULATORY SURGERY  DISCHARGE INSTRUCTIONS   1) The drugs that you were given will stay in your system until tomorrow so for the next 24 hours you should not:  A) Drive an automobile B) Make any legal decisions C) Drink any alcoholic beverage   2) You may resume regular meals tomorrow.  Today it is better to start with liquids and gradually work up to solid foods.  You may eat anything you prefer, but it is better to start with liquids, then soup and crackers, and gradually work up to solid foods.   3) Please notify your doctor immediately if you have any unusual bleeding, trouble breathing, redness and pain at the surgery site, drainage, fever, or pain not relieved by medication. 4)   5) Your post-operative visit with Dr.                                     is: Date:                        Time:    Please call to schedule your post-operative visit.  6) Additional Instructions:      Cystoscopy patient instructions  You may pass blood clots in your urine, especially if you had a biopsy. It is not unusual to pass small blood clots and have some bloody urine a couple of weeks after your cystoscopy. Again, call your doctor if the bleeding does not subside. You may have: Dysuria (painful urination) Frequency (urinating often) Urgency (strong desire to urinate)  These symptoms are common especially if medicine is instilled into the bladder or a ureteral stent is placed. Avoiding alcohol and caffeine, such as coffee, tea, and chocolate, may help relieve these symptoms. Drink plenty of water, unless otherwise instructed. Your doctor may also prescribe an antibiotic or other medicine to reduce these symptoms.  Cystoscopy results are available soon after the procedure; biopsy results usually take two to four days. Your doctor will discuss the results of your exam with you. Before you go home, you will be given specific instructions for follow-up care. Special Instructions:  1  If  you are going home with a catheter in place do not take a tub bath until removed by your doctor.  2  You may resume your normal activities.  3  Do not drive or operate machinery if you are taking narcotic pain medicine.  4  Be sure to keep all follow-up appointments with your doctor.   5 Call Your Doctor If: The catheter is not draining  You have severe pain  You are unable to urinate  You have a fever over 101  You have severe bleeding

## 2018-08-14 NOTE — Anesthesia Post-op Follow-up Note (Signed)
Anesthesia QCDR form completed.        

## 2018-08-14 NOTE — Interval H&P Note (Signed)
History and Physical Interval Note:  08/14/2018 3:55 PM  Scott [name]  has presented today for surgery, with the diagnosis of Hematuria.  The various methods of treatment have been discussed with the patient and family. After consideration of risks, benefits and other options for treatment, the patient has consented to  Procedure(s): CYSTOSCOPY WITH POSSIBLE BLADDER BIOPSY (N/A) as a surgical intervention.  The patient's history has been reviewed, patient examined, no change in status, stable for surgery.  I have reviewed the patient's chart and labs.  Questions were answered to the patient's satisfaction.     Tina

## 2018-08-14 NOTE — Anesthesia Postprocedure Evaluation (Signed)
Anesthesia Post Note  Patient: Mattew Chriswell Laday  Procedure(s) Performed: CYSTOSCOPY (N/A Bladder)  Patient location during evaluation: PACU Anesthesia Type: General Level of consciousness: awake and alert Pain management: pain level controlled Vital Signs Assessment: post-procedure vital signs reviewed and stable Respiratory status: spontaneous breathing, nonlabored ventilation, respiratory function stable and patient connected to nasal cannula oxygen Cardiovascular status: blood pressure returned to baseline and stable Postop Assessment: no apparent nausea or vomiting Anesthetic complications: no     Last Vitals:  Vitals:   08/14/18 1641 08/14/18 1643  BP: 126/73 126/73  Pulse: (!) 59 (!) 57  Resp: 13 14  Temp:  36.4 C  SpO2: 99% 99%    Last Pain:  Vitals:   08/14/18 1641  TempSrc:   PainSc: 0-No pain                 Precious Haws Piscitello

## 2018-08-14 NOTE — Transfer of Care (Signed)
Immediate Anesthesia Transfer of Care Note  Patient: Scott [name]  Procedure(s) Performed: CYSTOSCOPY (N/A Bladder)  Patient Location: PACU  Anesthesia Type:General  Level of Consciousness: awake  Airway & Oxygen Therapy: Patient Spontanous Breathing and Patient connected to face mask oxygen  Post-op Assessment: Report given to RN and Post -op Vital signs reviewed and stable  Post vital signs: stable  Last Vitals:  Vitals Value Taken Time  BP 126/85 08/14/18 1621  Temp 36.4 C 08/14/18 1621  Pulse 64 08/14/18 1624  Resp 12 08/14/18 1624  SpO2 100 % 08/14/18 1624  Vitals shown include unvalidated device data.  Last Pain:  Vitals:   08/14/18 1621  TempSrc:   PainSc: 0-No pain         Complications: No apparent anesthesia complications

## 2018-08-14 NOTE — Op Note (Signed)
Preoperative diagnosis:  1. Gross hematuria 2. BPH  Postoperative diagnosis:  1. Gross hematuria 2. BPH  Procedure: 1. Cystoscopy  Surgeon: Abbie Sons, MD  Anesthesia: General  Complications: None  Intraoperative findings:  Urethra-wide caliber bulbar stricture Prostate-moderate lateral lobe enlargement with small median lobe.  Prominent hypervascularity and friability prostatic urethra Bladder-normal-appearing mucosa without erythema, solid or papillary lesions.  Ureteral orifices normal appearing bilaterally with clear efflux.  Mild trabeculation  EBL: Minimal  Specimens: None  Indication: Scott [name] is a 78 y.o. patient with a history of intermittent gross hematuria with office cystoscopy showing BPH and hypervascularity.  CT urogram was unremarkable.  At a recent six-month follow-up he complained of increasing gross hematuria.  Repeat cystoscopy was recommended and he requested to have performed under anesthesia.  He is on chronic anticoagulation with Xarelto.  After reviewing the management options for treatment, he elected to proceed with the above surgical procedure(s). We have discussed the potential benefits and risks of the procedure, side effects of the proposed treatment, the likelihood of the patient achieving the goals of the procedure, and any potential problems that might occur during the procedure or recuperation. Informed consent has been obtained.  Description of procedure:  The patient was taken to the operating room and general anesthesia was induced.  The patient was placed in the dorsal lithotomy position, prepped and draped in the usual sterile fashion, and preoperative antibiotics were administered. A preoperative time-out was performed.   A 21 French cystoscope was lubricated and passed under direct vision with findings as described above.  The cystoscope easily traversed the wide caliber bulbar stricture.  The prostate demonstrated prominent  hypervascularity and friability.  No bladder mucosal abnormalities were identified.  Plan: His bleeding at this point does not appear to be significant and will continue to monitor.  Follow-up office visit will be scheduled.   Abbie Sons, M.D.

## 2018-08-14 NOTE — Anesthesia Preprocedure Evaluation (Addendum)
Anesthesia Evaluation  Patient identified by MRN, date of birth, ID band Patient awake    Reviewed: Allergy & Precautions, NPO status , Patient's Chart, lab work & pertinent test results  History of Anesthesia Complications Negative for: history of anesthetic complications  Airway Mallampati: III       Dental   Pulmonary neg sleep apnea, neg COPD, former smoker,           Cardiovascular hypertension, Pt. on medications + angina with exertion + CAD, + Past MI, + Cardiac Stents, + CABG and +CHF (LVEF 45%)  (-) dysrhythmias (-) Valvular Problems/Murmurs     Neuro/Psych neg Seizures PSYCHIATRIC DISORDERS Anxiety    GI/Hepatic Neg liver ROS, neg GERD  ,  Endo/Other  neg diabetes  Renal/GU negative Renal ROS     Musculoskeletal   Abdominal   Peds  Hematology  (+) anemia ,   Anesthesia Other Findings   Reproductive/Obstetrics                            Anesthesia Physical Anesthesia Plan  ASA: III  Anesthesia Plan: General   Post-op Pain Management:    Induction: Intravenous  PONV Risk Score and Plan: 2 and Dexamethasone and Ondansetron  Airway Management Planned: LMA  Additional Equipment:   Intra-op Plan:   Post-operative Plan:   Informed Consent: I have reviewed the patients History and Physical, chart, labs and discussed the procedure including the risks, benefits and alternatives for the proposed anesthesia with the patient or authorized representative who has indicated his/her understanding and acceptance.       Plan Discussed with:   Anesthesia Plan Comments:         Anesthesia Quick Evaluation

## 2018-08-15 ENCOUNTER — Telehealth: Payer: Self-pay | Admitting: Urology

## 2018-08-15 ENCOUNTER — Telehealth: Payer: Self-pay | Admitting: *Deleted

## 2018-08-15 DIAGNOSIS — R310 Gross hematuria: Secondary | ICD-10-CM

## 2018-08-15 NOTE — Telephone Encounter (Signed)
Patient called reporting that he has had hematuria since September and was seen yesterday and they cannot find the cause of his bleeding. He is asking if he should come in for labs to see if he needs another infusion or be checked for cancer. Please advise

## 2018-08-15 NOTE — Telephone Encounter (Signed)
Patient would like to know how he is to follow up and what was seen yesterday in surgery

## 2018-08-15 NOTE — Telephone Encounter (Signed)
His hemoglobin and iron store have been stable despite having persistent hematuria (pink urine). Last blood work was done in may and was normal.  Unless he has developed significant gross hematuria, I don't think he needs repeat blood work for now.  If he insists, I am ok with check cbc, iron tibc and ferritin. Please order and arrange. Thanks.

## 2018-08-15 NOTE — Telephone Encounter (Signed)
Pt called and would like a call back to discuss the plan for after surgery that was done on 08/14/2018. Please advise.

## 2018-08-16 NOTE — Telephone Encounter (Addendum)
Call returned to patient and explained to him doctor response. He states OK and he will wait for any labs until his current appointment date in November. I advised him to call back if anything changes

## 2018-08-21 NOTE — Addendum Note (Signed)
Addended by: Abbie Sons on: 08/21/2018 04:59 PM   Modules accepted: Orders

## 2018-08-21 NOTE — Telephone Encounter (Signed)
I contacted Scott [name] regarding cystoscopy findings.  His prostate under anesthesia was only moderately large with hypervascularity.  I would not recommend an outlet procedure or PAE unless he was becoming anemic.  I recommended monitoring and have him follow-up in 1 month for a symptom check and hemoglobin/hematocrit.  Please cancel his 8/11 appointment with me and reschedule for approximately 1 month.  Lab visit prior to appointment.

## 2018-08-22 NOTE — Telephone Encounter (Signed)
appts made and mailed to patient  Scott [name]

## 2018-09-03 ENCOUNTER — Ambulatory Visit: Payer: Medicare HMO | Admitting: Urology

## 2018-09-20 ENCOUNTER — Other Ambulatory Visit: Payer: Self-pay

## 2018-09-20 ENCOUNTER — Other Ambulatory Visit: Payer: Medicare HMO

## 2018-09-20 DIAGNOSIS — R310 Gross hematuria: Secondary | ICD-10-CM

## 2018-09-21 LAB — HEMOGLOBIN: Hemoglobin: 15.1 g/dL (ref 13.0–17.7)

## 2018-09-21 LAB — HEMATOCRIT: Hematocrit: 45.3 % (ref 37.5–51.0)

## 2018-09-24 ENCOUNTER — Other Ambulatory Visit: Payer: Self-pay

## 2018-09-24 ENCOUNTER — Encounter: Payer: Self-pay | Admitting: Urology

## 2018-09-24 ENCOUNTER — Ambulatory Visit: Payer: Medicare HMO | Admitting: Urology

## 2018-09-24 VITALS — BP 101/63 | HR 68 | Ht 70.0 in | Wt 173.0 lb

## 2018-09-24 DIAGNOSIS — R310 Gross hematuria: Secondary | ICD-10-CM | POA: Diagnosis not present

## 2018-09-24 LAB — MICROSCOPIC EXAMINATION
Bacteria, UA: NONE SEEN
Epithelial Cells (non renal): NONE SEEN /hpf (ref 0–10)
RBC, Urine: NONE SEEN /hpf (ref 0–2)
WBC, UA: NONE SEEN /hpf (ref 0–5)

## 2018-09-24 LAB — URINALYSIS, COMPLETE
Bilirubin, UA: NEGATIVE
Glucose, UA: NEGATIVE
Ketones, UA: NEGATIVE
Leukocytes,UA: NEGATIVE
Nitrite, UA: NEGATIVE
Protein,UA: NEGATIVE
RBC, UA: NEGATIVE
Specific Gravity, UA: >1.03 — ABNORMAL HIGH (ref 1.005–1.030)
Urobilinogen, Ur: 1 mg/dL (ref 0.2–1.0)
pH, UA: 6 (ref 5.0–7.5)

## 2018-09-24 NOTE — Progress Notes (Signed)
09/24/2018 11:17 AM   Scott [name] 1940-06-22 [id number]  Referring provider: Baxter Hire, MD [place],  [place] [address]  Chief Complaint  Patient presents with  . Follow-up    HPI: 78 y.o. male presents for follow-up of gross hematuria.  At his initial evaluation CT urogram was unremarkable and office cystoscopy showed prostate enlargement with hypervascularity.  He underwent cystoscopy under anesthesia on 08/14/2018 for increasing gross hematuria.  He is on chronic anticoagulation with Xarelto.  Cystoscopy again showed moderate prostate enlargement with friability and hypervascularity.  Since his cystoscopy under anesthesia he denies recurrent gross hematuria.  He has no complaints today.  H&H performed last week were normal at 15.1/45.3.   PMH: Past Medical History:  Diagnosis Date  . Arthritis    right hip, since a fall  . CAD (coronary artery disease)    a. 04/2012 Cath: 3VD->Med Rx;  b. 04/2013 PCI RCA (2 DES); c. 03/2014 PCI: LAD 80 (3.0x23 Xience [employer]pine DES); d. 06/2014 CABG x 2 ([facility]) LIMA->LAD, VG->OM; e. 12/2014 Cath ([facility]): patent grafts->Med Rx; f. 08/2015 Cath: patent grafts; g. 11/2016 Cath: LM min irregs, LAD 20p, 33m, LCX 100p/m, RCA 20ost, 10p/m/d, VG->OM1 nl, LIMA->dLAD nl; f. 07/2017 MV: No isch, EF 51%.  . Cardiomyopathy, ischemic    a. 04/2012 Echo: EF 40-45%;  b. 08/2013 Echo: EF 45-50%; c. 01/2015 Echo: EF 40-45%; d. 07/2016 Echo: EF 60-65%; e. 09/2017 Echo: EF 55-60%, Gr1 DD.  . Carotid arterial disease ([place])    a. 05/2013 Carotid U/S; bilat 40-50% ICA stenosis.  . Chronic Chest Pain    USES NITRO  . Chronic systolic CHF (congestive heart failure) ([place])    a. 01/2015 Echo: EF 40-45%; b. 07/2016 Echo: EF 60-65%; c. 09/2017 Echo: EF 55-60%, Gr1 DD, nl RV fxn.  . Cough   . Diverticulosis   . Dizziness   . Dyspnea    WITH EXERTION  . Headache 1970's   migraine  . History of blood transfusion 2016   post op  . Hyperlipidemia   .  Hypertension   . Iron deficiency anemia due to chronic blood loss 09/11/2016  . Myocardial infarction [facility])    unsure of when  . Pulmonary emboli ([place]) 07/2016   a. On Xarelto.  . Reflux esophagitis   . Sternal pain    a. 03/2016 s/p Sternal wire removal; b. 05/2016 s/p redo median sternotomy for sternal debridement and sternal plating.  . Syncope 04/2018   CARDIOLOGIST WAS DR Rockey Situ AND HE WAS AWARE-NOW PT SEES PARASCHOS  . Tubular adenoma of colon     Surgical History: Past Surgical History:  Procedure Laterality Date  . CARDIAC CATHETERIZATION  05/01/2012  . CARDIAC CATHETERIZATION  04/2013   [facility];x3 stent  . CARDIAC CATHETERIZATION  01/11/15    [facility]  . CARDIAC CATHETERIZATION N/A 09/16/2015   Procedure: LEFT HEART CATH AND CORS/GRAFTS ANGIOGRAPHY;  Surgeon: Minna Merritts, MD;  Location: [place] [facility];  Service: Cardiovascular;  Laterality: N/A;  . CARPAL TUNNEL RELEASE     right hand  . CATARACT EXTRACTION     LEFT  . CATARACT EXTRACTION W/PHACO Left 09/16/2014   Procedure: CATARACT EXTRACTION PHACO AND INTRAOCULAR LENS PLACEMENT (IOC);  Surgeon: Leandrew Koyanagi, MD;  Location: [place];  Service: Ophthalmology;  Laterality: Left;  . COLONOSCOPY    . COLONOSCOPY WITH PROPOFOL N/A 01/28/2016   Procedure: COLONOSCOPY WITH PROPOFOL;  Surgeon: Lollie Sails, MD;  Location: [facility];  Service: Endoscopy;  Laterality: N/A;  . CORONARY ANGIOPLASTY WITH STENT PLACEMENT  04/13/2014  . CORONARY ARTERY BYPASS GRAFT  06-26-14   x3 bypasses  . CYSTOSCOPY N/A 08/14/2018   Procedure: CYSTOSCOPY;  Surgeon: Abbie Sons, MD;  Location: [facility];  Service: Urology;  Laterality: N/A;  . DE QUERVAIN'S RELEASE Left 08/22/2012  . ESOPHAGOGASTRODUODENOSCOPY (EGD) WITH PROPOFOL N/A 04/26/2015   Procedure: ESOPHAGOGASTRODUODENOSCOPY (EGD) WITH PROPOFOL;  Surgeon: Hulen Luster, MD;  Location: [facility] ENDOSCOPY;  Service: Gastroenterology;  Laterality: N/A;  .  ESOPHAGOGASTRODUODENOSCOPY (EGD) WITH PROPOFOL N/A 05/29/2017   Procedure: ESOPHAGOGASTRODUODENOSCOPY (EGD) WITH PROPOFOL;  Surgeon: Lollie Sails, MD;  Location: [facility] ENDOSCOPY;  Service: Endoscopy;  Laterality: N/A;  . LEFT HEART CATH AND CORONARY ANGIOGRAPHY N/A 12/04/2016   Procedure: LEFT HEART CATH AND CORS/GRAFTS  ANGIOGRAPHY;  Surgeon: Wellington Hampshire, MD;  Location: [place] [facility];  Service: Cardiovascular;  Laterality: N/A;  . LEFT HEART CATH AND CORS/GRAFTS ANGIOGRAPHY N/A 04/09/2018   Procedure: LEFT HEART CATH AND CORS/GRAFTS ANGIOGRAPHY;  Surgeon: Isaias Cowman, MD;  Location: [place] [facility];  Service: Cardiovascular;  Laterality: N/A;  . RIB PLATING N/A 05/25/2016   Procedure: STERNAL PLATING;  Surgeon: Melrose Nakayama, MD;  Location: [place];  Service: Thoracic;  Laterality: N/A;  . right shoulder    . STERNAL WIRES REMOVAL N/A 03/30/2016   Procedure: STERNAL WIRES REMOVAL;  Surgeon: Melrose Nakayama, MD;  Location: [place];  Service: Thoracic;  Laterality: N/A;  . TONSILLECTOMY      Home Medications:  Allergies as of 09/24/2018      Reactions   Contrast Media [iodinated Diagnostic Agents] Hives   Metrizamide Hives   Oxycodone Hcl Itching   Vicodin [hydrocodone-acetaminophen] Itching      Medication List       Accurate as of September 24, 2018 11:17 AM. If you have any questions, ask your nurse or doctor.        atorvastatin 80 MG tablet Commonly known as: LIPITOR Take 1 tablet (80 mg total) by mouth daily at 6 PM.   ezetimibe 10 MG tablet Commonly known as: ZETIA Take 1 tablet (10 mg total) by mouth daily.   finasteride 5 MG tablet Commonly known as: PROSCAR Take 1 tablet (5 mg total) by mouth daily. What changed: when to take this   isosorbide mononitrate 60 MG 24 hr tablet Commonly known as: IMDUR Take 1 tablet by mouth 2 (two) times a day.   nitroGLYCERIN 0.4 MG SL tablet Commonly known as: NITROSTAT Place 1 tablet (0.4 mg  total) under the tongue every 5 (five) minutes as needed for chest pain.   ranolazine 1000 MG SR tablet Commonly known as: RANEXA Take 1 tablet (1,000 mg total) by mouth 2 (two) times daily.   rivaroxaban 20 MG Tabs tablet Commonly known as: Xarelto Take 1 tablet (20 mg total) by mouth daily.       Allergies:  Allergies  Allergen Reactions  . Contrast Media [Iodinated Diagnostic Agents] Hives  . Metrizamide Hives  . Oxycodone Hcl Itching  . Vicodin [Hydrocodone-Acetaminophen] Itching    Family History: Family History  Problem Relation Age of Onset  . Heart attack Father 49       MI  . Cancer Maternal Uncle     Social History:  reports that he has quit smoking. His smoking use included cigarettes. He started smoking about 9 years ago. He has a 35.00 pack-year smoking history. He has quit using smokeless  tobacco. He reports that he does not drink alcohol or use drugs.  ROS: UROLOGY Frequent Urination?: No Hard to postpone urination?: No Burning/pain with urination?: No Get up at night to urinate?: No Leakage of urine?: No Urine stream starts and stops?: No Trouble starting stream?: No Do you have to strain to urinate?: No Blood in urine?: Yes Urinary tract infection?: No Sexually transmitted disease?: No Injury to kidneys or bladder?: No Painful intercourse?: No Weak stream?: No Erection problems?: No Penile pain?: No  Gastrointestinal Nausea?: No Vomiting?: No Indigestion/heartburn?: No Diarrhea?: No Constipation?: No  Constitutional Fever: No Night sweats?: No Weight loss?: No Fatigue?: No  Skin Skin rash/lesions?: No Itching?: No  Eyes Blurred vision?: No Double vision?: No  Ears/Nose/Throat Sore throat?: No Sinus problems?: No  Hematologic/Lymphatic Swollen glands?: No Easy bruising?: Yes  Cardiovascular Leg swelling?: Yes Chest pain?: No  Respiratory Cough?: No Shortness of breath?: Yes  Endocrine Excessive thirst?: No   Musculoskeletal Back pain?: No Joint pain?: No  Neurological Headaches?: No Dizziness?: Yes  Psychologic Depression?: No Anxiety?: No  Physical Exam: BP 101/63   Pulse 68   Ht 5\' 10"  (1.778 m)   Wt 173 lb (78.5 kg)   BMI 24.82 kg/m   Constitutional:  Alert and oriented, No acute distress. HEENT: [place] AT, moist mucus membranes.  Trachea midline, no masses. Cardiovascular: No clubbing, cyanosis, or edema. Respiratory: Normal respiratory effort, no increased work of breathing. Skin: No rashes, bruises or suspicious lesions. Neurologic: Grossly intact, no focal deficits, moving all 4 extremities. Psychiatric: Normal mood and affect.   Assessment & Plan:    - Gross hematuria Most likely secondary to BPH/anticoagulation.  No recurrent hematuria since his last cystoscopy in late July.  Follow-up 6 months and he was instructed to call earlier for recurrent gross hematuria.    Abbie Sons, [place] [address] [place], [place] [address] [phone]

## 2018-11-19 ENCOUNTER — Other Ambulatory Visit: Payer: Self-pay | Admitting: Cardiovascular Disease

## 2018-11-20 ENCOUNTER — Other Ambulatory Visit: Payer: Self-pay | Admitting: Cardiovascular Disease

## 2018-12-06 ENCOUNTER — Other Ambulatory Visit
Admission: RE | Admit: 2018-12-06 | Discharge: 2018-12-06 | Disposition: A | Discharge status: Home / Self Care | Payer: Medicare HMO | Source: Ambulatory Visit | Attending: Cardiology | Admitting: Cardiology

## 2018-12-06 ENCOUNTER — Other Ambulatory Visit: Payer: Self-pay

## 2018-12-06 DIAGNOSIS — Z20828 Contact with and (suspected) exposure to other viral communicable diseases: Secondary | ICD-10-CM | POA: Diagnosis not present

## 2018-12-06 DIAGNOSIS — Z01812 Encounter for preprocedural laboratory examination: Secondary | ICD-10-CM | POA: Insufficient documentation

## 2018-12-07 LAB — SARS CORONAVIRUS 2 (TAT 6-24 HRS): SARS Coronavirus 2: NEGATIVE

## 2018-12-10 ENCOUNTER — Encounter
Admission: RE | Disposition: A | Discharge status: Home / Self Care | Payer: Self-pay | Source: Home / Self Care | Attending: Cardiology

## 2018-12-10 ENCOUNTER — Encounter: Payer: Self-pay | Admitting: *Deleted

## 2018-12-10 ENCOUNTER — Ambulatory Visit
Admission: RE | Admit: 2018-12-10 | Discharge: 2018-12-10 | Disposition: A | Discharge status: Home / Self Care | Payer: Medicare HMO | Attending: Cardiology | Admitting: Cardiology

## 2018-12-10 ENCOUNTER — Other Ambulatory Visit: Payer: Self-pay

## 2018-12-10 DIAGNOSIS — Z79899 Other long term (current) drug therapy: Secondary | ICD-10-CM | POA: Diagnosis not present

## 2018-12-10 DIAGNOSIS — Z955 Presence of coronary angioplasty implant and graft: Secondary | ICD-10-CM | POA: Insufficient documentation

## 2018-12-10 DIAGNOSIS — Z885 Allergy status to narcotic agent status: Secondary | ICD-10-CM | POA: Diagnosis not present

## 2018-12-10 DIAGNOSIS — R55 Syncope and collapse: Secondary | ICD-10-CM | POA: Insufficient documentation

## 2018-12-10 DIAGNOSIS — Z91041 Radiographic dye allergy status: Secondary | ICD-10-CM | POA: Diagnosis not present

## 2018-12-10 DIAGNOSIS — Z8249 Family history of ischemic heart disease and other diseases of the circulatory system: Secondary | ICD-10-CM | POA: Diagnosis not present

## 2018-12-10 DIAGNOSIS — I110 Hypertensive heart disease with heart failure: Secondary | ICD-10-CM | POA: Insufficient documentation

## 2018-12-10 DIAGNOSIS — I2510 Atherosclerotic heart disease of native coronary artery without angina pectoris: Secondary | ICD-10-CM | POA: Diagnosis not present

## 2018-12-10 DIAGNOSIS — R079 Chest pain, unspecified: Secondary | ICD-10-CM

## 2018-12-10 DIAGNOSIS — Z87891 Personal history of nicotine dependence: Secondary | ICD-10-CM | POA: Insufficient documentation

## 2018-12-10 DIAGNOSIS — R0602 Shortness of breath: Secondary | ICD-10-CM | POA: Insufficient documentation

## 2018-12-10 DIAGNOSIS — I2584 Coronary atherosclerosis due to calcified coronary lesion: Secondary | ICD-10-CM | POA: Diagnosis not present

## 2018-12-10 DIAGNOSIS — E782 Mixed hyperlipidemia: Secondary | ICD-10-CM | POA: Diagnosis not present

## 2018-12-10 DIAGNOSIS — Z951 Presence of aortocoronary bypass graft: Secondary | ICD-10-CM | POA: Diagnosis not present

## 2018-12-10 DIAGNOSIS — Z888 Allergy status to other drugs, medicaments and biological substances status: Secondary | ICD-10-CM | POA: Insufficient documentation

## 2018-12-10 DIAGNOSIS — I5032 Chronic diastolic (congestive) heart failure: Secondary | ICD-10-CM | POA: Diagnosis not present

## 2018-12-10 DIAGNOSIS — Z7901 Long term (current) use of anticoagulants: Secondary | ICD-10-CM | POA: Diagnosis not present

## 2018-12-10 DIAGNOSIS — I480 Paroxysmal atrial fibrillation: Secondary | ICD-10-CM | POA: Insufficient documentation

## 2018-12-10 DIAGNOSIS — I255 Ischemic cardiomyopathy: Secondary | ICD-10-CM | POA: Insufficient documentation

## 2018-12-10 DIAGNOSIS — Z7982 Long term (current) use of aspirin: Secondary | ICD-10-CM | POA: Insufficient documentation

## 2018-12-10 HISTORY — PX: CORONARY PRESSURE WIRE/FFR WITH 3D MAPPING: CATH118309

## 2018-12-10 HISTORY — PX: INTRAVASCULAR ULTRASOUND/IVUS: CATH118244

## 2018-12-10 HISTORY — PX: LEFT HEART CATH AND CORS/GRAFTS ANGIOGRAPHY: CATH118250

## 2018-12-10 LAB — POCT ACTIVATED CLOTTING TIME: Activated Clotting Time: 335 seconds

## 2018-12-10 SURGERY — LEFT HEART CATH AND CORS/GRAFTS ANGIOGRAPHY
Anesthesia: Moderate Sedation

## 2018-12-10 MED ORDER — SODIUM CHLORIDE 0.9 % IV SOLN: 250.0000 mL | INTRAVENOUS | Status: DC | PRN

## 2018-12-10 MED ORDER — SODIUM CHLORIDE 0.9 % IV SOLN
INTRAVENOUS | Status: DC
  Administered 2018-12-10: 07:00:00 via INTRAVENOUS

## 2018-12-10 MED ORDER — BIVALIRUDIN TRIFLUOROACETATE 250 MG IV SOLR
INTRAVENOUS | Status: AC
  Filled 2018-12-10: qty 250

## 2018-12-10 MED ORDER — SODIUM CHLORIDE 0.9% FLUSH: 3.0000 mL | Freq: Two times a day (BID) | INTRAVENOUS | Status: DC

## 2018-12-10 MED ORDER — ASPIRIN 81 MG PO CHEW
CHEWABLE_TABLET | ORAL | Status: AC
  Filled 2018-12-10: qty 1

## 2018-12-10 MED ORDER — SODIUM CHLORIDE 0.9% FLUSH: 3.0000 mL | INTRAVENOUS | Status: DC | PRN

## 2018-12-10 MED ORDER — LABETALOL HCL 5 MG/ML IV SOLN: 10.0000 mg | INTRAVENOUS | Status: DC | PRN

## 2018-12-10 MED ORDER — SODIUM CHLORIDE 0.9 % IV SOLN
INTRAVENOUS | Status: DC | PRN
  Administered 2018-12-10: 08:00:00 1.75 mg/kg/h via INTRAVENOUS

## 2018-12-10 MED ORDER — ONDANSETRON HCL 4 MG/2ML IJ SOLN: 4.0000 mg | Freq: Four times a day (QID) | INTRAMUSCULAR | Status: DC | PRN

## 2018-12-10 MED ORDER — DIPHENHYDRAMINE HCL 25 MG PO CAPS
ORAL_CAPSULE | ORAL | Status: AC
  Administered 2018-12-10: 25 mg via ORAL
  Filled 2018-12-10: qty 1

## 2018-12-10 MED ORDER — ASPIRIN 81 MG PO CHEW
81.0000 mg | CHEWABLE_TABLET | ORAL | Status: AC
  Administered 2018-12-10: 07:00:00 81 mg via ORAL

## 2018-12-10 MED ORDER — SODIUM CHLORIDE 0.9 % WEIGHT BASED INFUSION: 1.0000 mL/kg/h | INTRAVENOUS | Status: DC

## 2018-12-10 MED ORDER — MIDAZOLAM HCL 2 MG/2ML IJ SOLN
INTRAMUSCULAR | Status: AC
  Filled 2018-12-10: qty 2

## 2018-12-10 MED ORDER — DIPHENHYDRAMINE HCL 25 MG PO CAPS
25.0000 mg | ORAL_CAPSULE | Freq: Once | ORAL | Status: AC
  Administered 2018-12-10: 08:00:00 25 mg via ORAL

## 2018-12-10 MED ORDER — FENTANYL CITRATE (PF) 100 MCG/2ML IJ SOLN
INTRAMUSCULAR | Status: DC | PRN
  Administered 2018-12-10 (×2): 50 ug via INTRAVENOUS

## 2018-12-10 MED ORDER — MIDAZOLAM HCL 2 MG/2ML IJ SOLN
INTRAMUSCULAR | Status: DC | PRN
  Administered 2018-12-10 (×2): 1 mg via INTRAVENOUS

## 2018-12-10 MED ORDER — METHYLPREDNISOLONE SODIUM SUCC 125 MG IJ SOLR: 125.0000 mg | Freq: Once | INTRAMUSCULAR | Status: DC

## 2018-12-10 MED ORDER — HEPARIN (PORCINE) IN NACL 1000-0.9 UT/500ML-% IV SOLN
INTRAVENOUS | Status: AC
  Filled 2018-12-10: qty 1000

## 2018-12-10 MED ORDER — HEPARIN (PORCINE) IN NACL 1000-0.9 UT/500ML-% IV SOLN
INTRAVENOUS | Status: DC | PRN
  Administered 2018-12-10: 1000 mL

## 2018-12-10 MED ORDER — FENTANYL CITRATE (PF) 100 MCG/2ML IJ SOLN
INTRAMUSCULAR | Status: AC
  Filled 2018-12-10: qty 2

## 2018-12-10 MED ORDER — METHYLPREDNISOLONE SODIUM SUCC 125 MG IJ SOLR
INTRAMUSCULAR | Status: AC
  Administered 2018-12-10: 125 mg
  Filled 2018-12-10: qty 2

## 2018-12-10 MED ORDER — HYDRALAZINE HCL 20 MG/ML IJ SOLN: 10.0000 mg | INTRAMUSCULAR | Status: DC | PRN

## 2018-12-10 MED ORDER — IOHEXOL 300 MG/ML  SOLN
INTRAMUSCULAR | Status: DC | PRN
  Administered 2018-12-10: 180 mL

## 2018-12-10 MED ORDER — BIVALIRUDIN BOLUS VIA INFUSION - CUPID
INTRAVENOUS | Status: DC | PRN
  Administered 2018-12-10: 08:00:00 59.85 mg via INTRAVENOUS

## 2018-12-10 SURGICAL SUPPLY — 16 items
CATH EAGLE EYE PLAT IMAGING (CATHETERS) ×2 IMPLANT
CATH INFINITI 5 FR 3DRC (CATHETERS) ×2 IMPLANT
CATH INFINITI 5FR ANG PIGTAIL (CATHETERS) ×2 IMPLANT
CATH INFINITI 5FR JL4 (CATHETERS) ×2 IMPLANT
CATH INFINITI JR4 5F (CATHETERS) ×2 IMPLANT
CATH VISTA GUIDE 6FR XB3.5 SH (CATHETERS) ×2 IMPLANT
DEVICE CLOSURE MYNXGRIP 6/7F (Vascular Products) ×2 IMPLANT
DEVICE INFLAT 30 PLUS (MISCELLANEOUS) ×2 IMPLANT
KIT MANI 3VAL PERCEP (MISCELLANEOUS) ×2 IMPLANT
NEEDLE PERC 18GX7CM (NEEDLE) ×2 IMPLANT
PACK CARDIAC CATH (CUSTOM PROCEDURE TRAY) ×2 IMPLANT
SHEATH AVANTI 5FR X 11CM (SHEATH) IMPLANT
SHEATH AVANTI 6FR X 11CM (SHEATH) ×2 IMPLANT
WIRE ASAHI PROWATER 180CM (WIRE) ×2 IMPLANT
WIRE GUIDERIGHT .035X150 (WIRE) ×2 IMPLANT
WIRE PRESSURE VERRATA (WIRE) ×2 IMPLANT

## 2018-12-13 ENCOUNTER — Inpatient Hospital Stay (HOSPITAL_BASED_OUTPATIENT_CLINIC_OR_DEPARTMENT_OTHER): Payer: Medicare HMO | Admitting: Oncology

## 2018-12-13 ENCOUNTER — Inpatient Hospital Stay: Payer: Medicare HMO | Attending: Oncology

## 2018-12-13 ENCOUNTER — Other Ambulatory Visit: Payer: Self-pay

## 2018-12-13 ENCOUNTER — Encounter: Payer: Self-pay | Admitting: Oncology

## 2018-12-13 VITALS — BP 128/82 | HR 69 | Temp 98.1°F | Wt 176.0 lb

## 2018-12-13 DIAGNOSIS — Z86711 Personal history of pulmonary embolism: Secondary | ICD-10-CM | POA: Diagnosis not present

## 2018-12-13 DIAGNOSIS — D509 Iron deficiency anemia, unspecified: Secondary | ICD-10-CM | POA: Insufficient documentation

## 2018-12-13 DIAGNOSIS — D6859 Other primary thrombophilia: Secondary | ICD-10-CM | POA: Insufficient documentation

## 2018-12-13 DIAGNOSIS — Z7901 Long term (current) use of anticoagulants: Secondary | ICD-10-CM

## 2018-12-13 DIAGNOSIS — D500 Iron deficiency anemia secondary to blood loss (chronic): Secondary | ICD-10-CM | POA: Diagnosis not present

## 2018-12-13 LAB — COMPREHENSIVE METABOLIC PANEL
ALT: 21 U/L (ref 0–44)
AST: 23 U/L (ref 15–41)
Albumin: 3.8 g/dL (ref 3.5–5.0)
Alkaline Phosphatase: 45 U/L (ref 38–126)
Anion gap: 3 — ABNORMAL LOW (ref 5–15)
BUN: 19 mg/dL (ref 8–23)
CO2: 28 mmol/L (ref 22–32)
Calcium: 8.7 mg/dL — ABNORMAL LOW (ref 8.9–10.3)
Chloride: 105 mmol/L (ref 98–111)
Creatinine, Ser: 1.26 mg/dL — ABNORMAL HIGH (ref 0.61–1.24)
GFR calc Af Amer: >60 mL/min (ref >60)
GFR calc non Af Amer: 55 mL/min — ABNORMAL LOW (ref >60)
Glucose, Bld: 80 mg/dL (ref 70–99)
Potassium: 4.3 mmol/L (ref 3.5–5.1)
Sodium: 136 mmol/L (ref 135–145)
Total Bilirubin: 0.8 mg/dL (ref 0.3–1.2)
Total Protein: 7 g/dL (ref 6.5–8.1)

## 2018-12-13 LAB — CBC WITH DIFFERENTIAL/PLATELET
Abs Immature Granulocytes: 0.02 10*3/uL (ref 0.00–0.07)
Basophils Absolute: 0 10*3/uL (ref 0.0–0.1)
Basophils Relative: 0 %
Eosinophils Absolute: 0 10*3/uL (ref 0.0–0.5)
Eosinophils Relative: 1 %
HCT: 45.2 % (ref 39.0–52.0)
Hemoglobin: 15.1 g/dL (ref 13.0–17.0)
Immature Granulocytes: 0 %
Lymphocytes Relative: 31 %
Lymphs Abs: 2 10*3/uL (ref 0.7–4.0)
MCH: 32.3 pg (ref 26.0–34.0)
MCHC: 33.4 g/dL (ref 30.0–36.0)
MCV: 96.6 fL (ref 80.0–100.0)
Monocytes Absolute: 0.6 10*3/uL (ref 0.1–1.0)
Monocytes Relative: 9 %
Neutro Abs: 3.9 10*3/uL (ref 1.7–7.7)
Neutrophils Relative %: 59 %
Platelets: 209 10*3/uL (ref 150–400)
RBC: 4.68 MIL/uL (ref 4.22–5.81)
RDW: 13 % (ref 11.5–15.5)
WBC: 6.5 10*3/uL (ref 4.0–10.5)
nRBC: 0 % (ref 0.0–0.2)

## 2018-12-13 LAB — IRON AND TIBC
Iron: 134 ug/dL (ref 45–182)
Saturation Ratios: 41 % — ABNORMAL HIGH (ref 17.9–39.5)
TIBC: 324 ug/dL (ref 250–450)
UIBC: 190 ug/dL

## 2018-12-13 LAB — FERRITIN: Ferritin: 35 ng/mL (ref 24–336)

## 2018-12-13 NOTE — Progress Notes (Signed)
Patient had a cardiac catheterization on 12/10/2018 by Dr. Saralyn Pilar. Patient was told that he did not have any blockages. Patient continues to have chest pain and it has been this way for several months.

## 2018-12-13 NOTE — Progress Notes (Signed)
[facility]  Follow Up Visit:  Patient Care Team: Baxter Hire, MD as PCP - General (Internal Medicine) Rockey Situ [name], MD as PCP - Cardiology (Cardiology) Madelyn Brunner, MD (Internal Medicine) Alisa Graff, FNP as Nurse Practitioner (Cardiology) Minna Merritts, MD as Consulting Physician (Cardiology)  REASON FOR VISIT Follow up for treatment of iron deficiency anemia  HISTORY OF PRESENTING ILLNESS: Scott [name] 78 y.o. male with PMH listed as below who is referred to me for evaluation and management of pulmonary embolism.  Scott [name] has a history paroxymal atrial fibrillation on Eliquis 5mg  BID until recently, history of CAD s/p CABG x 2 at [facility] in 2016 with chronic chest pain, s/p removal of sternal wire on 03/2016,  s/p sternal debridement and plating on 05/25/2016 in the setting of CT chest performed by pain management showing sternal nonunion of the manubrium with subsequent improvement in his pain. Perioperatively his anticoagulation with Eliquis was discontinue a few days prior surgery and resumed after surgery. Operation note mentioned that during the procedure, the left lung was densely adherent to the underside of the sternum and tear in the lung did occur and was sutured. He presents  to [facility] on 08/04/2016 for evaluation of SOB and was found to have a PE. Patient states that he was compliant with Eliquis.  CTA chest showed left upper lobe PE extending into the left main pulmonary artery. His Eliquis was changed to Xarelto. Lower extremity dopplers were negative for DVT. Hypercoagulable workup was sent and all tests were negative except a mild low protein S function.  No formal cancer workup.  Sed rate normal, heavy metals negative, TSH mildly reduced at 0.253. 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     Hypercoagulable work up: discussed with patient. He is tested negative  for factor 5 leiden mutation, prothrombin mutation, anti phospholipin antibody panels. Normal protein C activity. Normal total protein S, mildly low protein S function, can be falsely low in the acute setting of thrombosis.Congential protein S deficiency is unlikely given that he has no prior history of VTE. Repeat protein S total and free were normal.   # He has had one dose of Feraheme and during the treatment he had a chest tightness and end of infusion. Has received IV iron Venofer and tolerates well.   #chronic intermittent chest pain and shortness of breath that has been previously extensively worked up.  He follows up with Dr. Nyoka Cowden for his heart care.  Extensive cardiology disease CAD status post prior RCA and LAD stenting as well as CABG x2 chronic chest pain status post sternal wire removal with sternal debridement and plating, ischemia cardiomyopathy, chronic dyspnea.  # CT angios chest PE protocol done on 11/07/2016 showed no lung nodules or mass.  INTERVAL HISTORY Patient presents for follow-up of chronic anticoagulation, iron deficiency present for follow up .  Patient continues to have intermittent chest pain and shortness of breath. He recently status post cardiac cath by Dr. Saralyn Pilar  Denies any additional episodes of hematuria. Takes chronic anticoagulation with Xarelto for history of pulmonary embolism. Denies hematochezia, hematuria, hematemesis, epistaxis, black tarry stool     Review of Systems  Constitutional: Positive for fatigue. Negative for appetite change, chills, diaphoresis, fever and unexpected weight change.  HENT:   Negative for hearing loss, lump/mass, mouth sores, nosebleeds, sore throat and voice change.   Eyes: Negative  for eye problems and icterus.  Respiratory: Positive for shortness of breath. Negative for chest tightness, cough, hemoptysis and wheezing.        SOB with exertion.   Cardiovascular: Negative for chest pain and leg swelling.        Chronic Intermittent chest pain.  Gastrointestinal: Negative for abdominal distention, abdominal pain, blood in stool, diarrhea, nausea and rectal pain.  Endocrine: Negative for hot flashes.  Genitourinary: Negative for bladder incontinence, difficulty urinating, dysuria, frequency, hematuria and nocturia.   Musculoskeletal: Negative for arthralgias, back pain, flank pain, gait problem and myalgias.  Skin: Negative for itching and rash.  Neurological: Negative for dizziness, gait problem, headaches, light-headedness, numbness and seizures.  Hematological: Negative for adenopathy. Does not bruise/bleed easily.  Psychiatric/Behavioral: Negative for confusion and decreased concentration. The patient is not nervous/anxious.     MEDICAL HISTORY: Past Medical History:  Diagnosis Date  . Arthritis    right hip, since a fall  . CAD (coronary artery disease)    a. 04/2012 Cath: 3VD->Med Rx;  b. 04/2013 PCI RCA (2 DES); c. 03/2014 PCI: LAD 80 (3.0x23 Xience Alpine DES); d. 06/2014 CABG x 2 ([facility]) LIMA->LAD, VG->OM; e. 12/2014 Cath ([facility]): patent grafts->Med Rx; f. 08/2015 Cath: patent grafts; g. 11/2016 Cath: LM min irregs, LAD 20p, 13m, LCX 100p/m, RCA 20ost, 10p/m/d, VG->OM1 nl, LIMA->dLAD nl; f. 07/2017 MV: No isch, EF 51%.  . Cardiomyopathy, ischemic    a. 04/2012 Echo: EF 40-45%;  b. 08/2013 Echo: EF 45-50%; c. 01/2015 Echo: EF 40-45%; d. 07/2016 Echo: EF 60-65%; e. 09/2017 Echo: EF 55-60%, Gr1 DD.  . Carotid arterial disease ([place])    a. 05/2013 Carotid U/S; bilat 40-50% ICA stenosis.  . Chronic Chest Pain    USES NITRO  . Chronic systolic CHF (congestive heart failure) ([place])    a. 01/2015 Echo: EF 40-45%; b. 07/2016 Echo: EF 60-65%; c. 09/2017 Echo: EF 55-60%, Gr1 DD, nl RV fxn.  . Cough   . Diverticulosis   . Dizziness   . Dyspnea    WITH EXERTION  . Headache 1970's   migraine  . History of blood transfusion 2016   post op  . Hyperlipidemia   . Hypertension   . Iron deficiency anemia due to  chronic blood loss 09/11/2016  . Myocardial infarction [facility])    unsure of when  . Pulmonary emboli ([place]) 07/2016   a. On Xarelto.  . Reflux esophagitis   . Sternal pain    a. 03/2016 s/p Sternal wire removal; b. 05/2016 s/p redo median sternotomy for sternal debridement and sternal plating.  . Syncope 04/2018   CARDIOLOGIST WAS DR Rockey Situ AND HE WAS AWARE-NOW PT SEES PARASCHOS  . Tubular adenoma of colon     SURGICAL HISTORY: Past Surgical History:  Procedure Laterality Date  . CARDIAC CATHETERIZATION  05/01/2012  . CARDIAC CATHETERIZATION  04/2013   [facility];x3 stent  . CARDIAC CATHETERIZATION  01/11/15    [facility]  . CARDIAC CATHETERIZATION N/A 09/16/2015   Procedure: LEFT HEART CATH AND CORS/GRAFTS ANGIOGRAPHY;  Surgeon: Minna Merritts, MD;  Location: [place] [facility];  Service: Cardiovascular;  Laterality: N/A;  . CARPAL TUNNEL RELEASE     right hand  . CATARACT EXTRACTION     LEFT  . CATARACT EXTRACTION W/PHACO Left 09/16/2014   Procedure: CATARACT EXTRACTION PHACO AND INTRAOCULAR LENS PLACEMENT (IOC);  Surgeon: Leandrew Koyanagi, MD;  Location: [place];  Service: Ophthalmology;  Laterality: Left;  . COLONOSCOPY    . COLONOSCOPY  WITH PROPOFOL N/A 01/28/2016   Procedure: COLONOSCOPY WITH PROPOFOL;  Surgeon: Lollie Sails, MD;  Location: [facility] ENDOSCOPY;  Service: Endoscopy;  Laterality: N/A;  . CORONARY ANGIOPLASTY WITH STENT PLACEMENT  04/13/2014  . CORONARY ARTERY BYPASS GRAFT  06-26-14   x3 bypasses  . CORONARY PRESSURE WIRE/FFR WITH 3D MAPPING N/A 12/10/2018   Procedure: Suzette Battiest PRESSURE WIRE/FFR STUDY;  Surgeon: Isaias Cowman, MD;  Location: [place] [facility];  Service: Cardiovascular;  Laterality: N/A;  . CYSTOSCOPY N/A 08/14/2018   Procedure: CYSTOSCOPY;  Surgeon: Abbie Sons, MD;  Location: [facility];  Service: Urology;  Laterality: N/A;  . DE QUERVAIN'S RELEASE Left 08/22/2012  . ESOPHAGOGASTRODUODENOSCOPY (EGD) WITH PROPOFOL N/A 04/26/2015    Procedure: ESOPHAGOGASTRODUODENOSCOPY (EGD) WITH PROPOFOL;  Surgeon: Hulen Luster, MD;  Location: [facility] ENDOSCOPY;  Service: Gastroenterology;  Laterality: N/A;  . ESOPHAGOGASTRODUODENOSCOPY (EGD) WITH PROPOFOL N/A 05/29/2017   Procedure: ESOPHAGOGASTRODUODENOSCOPY (EGD) WITH PROPOFOL;  Surgeon: Lollie Sails, MD;  Location: [facility] ENDOSCOPY;  Service: Endoscopy;  Laterality: N/A;  . INTRAVASCULAR ULTRASOUND/IVUS N/A 12/10/2018   Procedure: Intravascular Ultrasound/IVUS;  Surgeon: Isaias Cowman, MD;  Location: [place] [facility];  Service: Cardiovascular;  Laterality: N/A;  . LEFT HEART CATH AND CORONARY ANGIOGRAPHY N/A 12/04/2016   Procedure: LEFT HEART CATH AND CORS/GRAFTS  ANGIOGRAPHY;  Surgeon: Wellington Hampshire, MD;  Location: [place] [facility];  Service: Cardiovascular;  Laterality: N/A;  . LEFT HEART CATH AND CORS/GRAFTS ANGIOGRAPHY N/A 04/09/2018   Procedure: LEFT HEART CATH AND CORS/GRAFTS ANGIOGRAPHY;  Surgeon: Isaias Cowman, MD;  Location: [place] [facility];  Service: Cardiovascular;  Laterality: N/A;  . LEFT HEART CATH AND CORS/GRAFTS ANGIOGRAPHY N/A 12/10/2018   Procedure: LEFT HEART CATH AND CORS/GRAFTS ANGIOGRAPHY;  Surgeon: Isaias Cowman, MD;  Location: [place] [facility];  Service: Cardiovascular;  Laterality: N/A;  . RIB PLATING N/A 05/25/2016   Procedure: STERNAL PLATING;  Surgeon: Melrose Nakayama, MD;  Location: [place];  Service: Thoracic;  Laterality: N/A;  . right shoulder    . STERNAL WIRES REMOVAL N/A 03/30/2016   Procedure: STERNAL WIRES REMOVAL;  Surgeon: Melrose Nakayama, MD;  Location: [place];  Service: Thoracic;  Laterality: N/A;  . TONSILLECTOMY      SOCIAL HISTORY: Social History   Socioeconomic History  . Marital status: Married    Spouse name: Not on file  . Number of children: Not on file  . Years of education: Not on file  . Highest education level: Not on file  Occupational History  . Not on file  Social Needs  .  Financial resource strain: Not on file  . Food insecurity    Worry: Not on file    Inability: Not on file  . Transportation needs    Medical: Not on file    Non-medical: Not on file  Tobacco Use  . Smoking status: Former Smoker    Packs/day: 1.00    Years: 35.00    Pack years: 35.00    Types: Cigarettes    Start date: 05/02/2009  . Smokeless tobacco: Former Network engineer and Sexual Activity  . Alcohol use: No  . Drug use: No  . Sexual activity: Yes  Lifestyle  . Physical activity    Days per week: Not on file    Minutes per session: Not on file  . Stress: Not on file  Relationships  . Social Herbalist on phone: Not on file    Gets together: Not on file  Attends religious service: Not on file    Active member of club or organization: Not on file    Attends meetings of clubs or organizations: Not on file    Relationship status: Not on file  . Intimate partner violence    Fear of current or ex partner: Not on file    Emotionally abused: Not on file    Physically abused: Not on file    Forced sexual activity: Not on file  Other Topics Concern  . Not on file  Social History Narrative  . Not on file    FAMILY HISTORY Family History  Problem Relation Age of Onset  . Heart attack Father 76       MI  . Cancer Maternal Uncle     ALLERGIES:  is allergic to contrast media [iodinated diagnostic agents]; metrizamide; oxycodone hcl; and vicodin [hydrocodone-acetaminophen].  MEDICATIONS:  Current Outpatient Medications  Medication Sig Dispense Refill  . atorvastatin (LIPITOR) 80 MG tablet Take 1 tablet (80 mg total) by mouth daily at 6 PM. 30 tablet 0  . ezetimibe (ZETIA) 10 MG tablet Take 1 tablet (10 mg total) by mouth daily. 90 tablet 3  . isosorbide mononitrate (IMDUR) 30 MG 24 hr tablet Take 60 mg by mouth daily.     . nitroGLYCERIN (NITROSTAT) 0.4 MG SL tablet Place 1 tablet (0.4 mg total) under the tongue every 5 (five) minutes as needed for chest pain.  25 tablet 6  . ranolazine (RANEXA) 1000 MG SR tablet Take 1 tablet (1,000 mg total) by mouth 2 (two) times daily. 180 tablet 3  . rivaroxaban (XARELTO) 20 MG TABS tablet Take 1 tablet (20 mg total) by mouth daily. 90 tablet 3   Current Facility-Administered Medications  Medication Dose Route Frequency Provider Last Rate Last Dose  . lidocaine (XYLOCAINE) 2 % jelly 1 application  1 application Urethral Once Stoioff, Scott C, MD        PHYSICAL EXAMINATION:  ECOG PERFORMANCE STATUS: 1 - Symptomatic but completely ambulatory   Vitals:   12/13/18 1325  BP: 128/82  Pulse: 69  Temp: 98.1 F (36.7 C)    Filed Weights   12/13/18 1325  Weight: 176 lb (79.8 kg)     Physical Exam  Constitutional: He is oriented to person, place, and time. No distress.  HENT:  Head: Normocephalic and atraumatic.  Nose: Nose normal.  Mouth/Throat: Oropharynx is clear and moist. No oropharyngeal exudate.  Eyes: Pupils are equal, round, and reactive to light. Conjunctivae and EOM are normal. No scleral icterus.  Neck: Normal range of motion. Neck supple.  Cardiovascular: Normal rate and regular rhythm.  No murmur heard. Pulmonary/Chest: Effort normal and breath sounds normal. No respiratory distress.  Abdominal: Soft. Bowel sounds are normal. He exhibits no distension. There is no abdominal tenderness.  Musculoskeletal: Normal range of motion.        General: No edema.  Neurological: He is alert and oriented to person, place, and time. No cranial nerve deficit. He exhibits normal muscle tone. Coordination normal.  Skin: Skin is warm and dry. He is not diaphoretic. No erythema.  Psychiatric: Affect normal.        LABORATORY DATA: I have personally reviewed the data as listed:  Appointment on 12/13/2018  Component Date Value Ref Range Status  . Iron 12/13/2018 134  45 - 182 ug/dL Final  . TIBC 12/13/2018 324  250 - 450 ug/dL Final  . Saturation Ratios 12/13/2018 41* 17.9 - 39.5 % Final  .  UIBC 12/13/2018 190  ug/dL Final   Performed at [facility] [address] [place], [place] [address]  . Sodium 12/13/2018 136  135 - 145 mmol/L Final  . Potassium 12/13/2018 4.3  3.5 - 5.1 mmol/L Final  . Chloride 12/13/2018 105  98 - 111 mmol/L Final  . CO2 12/13/2018 28  22 - 32 mmol/L Final  . Glucose, Bld 12/13/2018 80  70 - 99 mg/dL Final  . BUN 12/13/2018 19  8 - 23 mg/dL Final  . Creatinine, Ser 12/13/2018 1.26* 0.61 - 1.24 mg/dL Final  . Calcium 12/13/2018 8.7* 8.9 - 10.3 mg/dL Final  . Total Protein 12/13/2018 7.0  6.5 - 8.1 g/dL Final  . Albumin 12/13/2018 3.8  3.5 - 5.0 g/dL Final  . AST 12/13/2018 23  15 - 41 U/L Final  . ALT 12/13/2018 21  0 - 44 U/L Final  . Alkaline Phosphatase 12/13/2018 45  38 - 126 U/L Final  . Total Bilirubin 12/13/2018 0.8  0.3 - 1.2 mg/dL Final  . GFR calc non Af Amer 12/13/2018 55* >60 mL/min Final  . GFR calc Af Amer 12/13/2018 >60  >60 mL/min Final  . Anion gap 12/13/2018 3* 5 - 15 Final   Performed at [facility], [address], [place], [place] [address]  . WBC 12/13/2018 6.5  4.0 - 10.5 K/uL Final  . RBC 12/13/2018 4.68  4.22 - 5.81 MIL/uL Final  . Hemoglobin 12/13/2018 15.1  13.0 - 17.0 g/dL Final  . HCT 12/13/2018 45.2  39.0 - 52.0 % Final  . MCV 12/13/2018 96.6  80.0 - 100.0 fL Final  . MCH 12/13/2018 32.3  26.0 - 34.0 pg Final  . MCHC 12/13/2018 33.4  30.0 - 36.0 g/dL Final  . RDW 12/13/2018 13.0  11.5 - 15.5 % Final  . Platelets 12/13/2018 209  150 - 400 K/uL Final  . nRBC 12/13/2018 0.0  0.0 - 0.2 % Final  . Neutrophils Relative % 12/13/2018 59  % Final  . Neutro Abs 12/13/2018 3.9  1.7 - 7.7 K/uL Final  . Lymphocytes Relative 12/13/2018 31  % Final  . Lymphs Abs 12/13/2018 2.0  0.7 - 4.0 K/uL Final  . Monocytes Relative 12/13/2018 9  % Final  . Monocytes Absolute 12/13/2018 0.6  0.1 - 1.0 K/uL Final  . Eosinophils Relative 12/13/2018 1  % Final  . Eosinophils Absolute 12/13/2018 0.0  0.0 - 0.5 K/uL Final   . Basophils Relative 12/13/2018 0  % Final  . Basophils Absolute 12/13/2018 0.0  0.0 - 0.1 K/uL Final  . Immature Granulocytes 12/13/2018 0  % Final  . Abs Immature Granulocytes 12/13/2018 0.02  0.00 - 0.07 K/uL Final   Performed at [facility], [address], [place], [place] [address]  . Ferritin 12/13/2018 35  24 - 336 ng/mL Final   Performed at [facility], [address]  [place], [place] [address]  Admission on 12/10/2018, Discharged on 12/10/2018  Component Date Value Ref Range Status  . Activated Clotting Time 12/10/2018 335  seconds Final  Hospital Outpatient Visit on 12/06/2018  Component Date Value Ref Range Status  . SARS Coronavirus 2 12/06/2018 NEGATIVE  NEGATIVE Final   Comment: (NOTE) SARS-CoV-2 target nucleic acids are NOT DETECTED. The SARS-CoV-2 RNA is generally detectable in upper and lower respiratory specimens during the acute phase of infection. Negative results do not preclude SARS-CoV-2 infection, do not rule out co-infections with other pathogens, and should not be used as  the sole basis for treatment or other patient management decisions. Negative results must be combined with clinical observations, patient history, and epidemiological information. The expected result is Negative. Fact Sheet for Patients: SugarRoll.be Fact Sheet for Healthcare Providers: https://www.woods-mathews.com/ This test is not yet approved or cleared by the [place] FDA and  has been authorized for detection and/or diagnosis of SARS-CoV-2 by FDA under an Emergency Use Authorization (EUA). This EUA will remain  in effect (meaning this test can be used) for the duration of the COVID-19 declaration under Section 56                          4(b)(1) of the Act, 21 U.S.C. section 360bbb-3(b)(1), unless the authorization is terminated or revoked sooner. Performed at [facility], [place] [address], [place],  [place] [address]    Lab Results  Component Value Date   IRON 134 12/13/2018   TIBC 324 12/13/2018   IRONPCTSAT 41 (H) 12/13/2018   Lab Results  Component Value Date   FERRITIN 35 12/13/2018    RADIOGRAPHIC STUDIES: I have personally reviewed the radiological images as listed and agree with the findings in the report No results found.   ASSESSMENT/PLAN 1. Iron deficiency anemia secondary to blood loss (chronic)   2. Chronic anticoagulation   3. History of pulmonary embolism   Labs are reviewed and discussed with patient. Patient has normal hemoglobin.  Iron panel was pending during the clinic and available afterwards. Iron panel shows stable and adequate iron level.  No need for IV iron. Chronic anticoagulation for pulmonary embolism.  Continue Xarelto. # Follow up with me in  1 year with  CBC, iron TIBC, ferritin repeat prior to the visit.   Earlie Server, MD, PhD Hematology Oncology [facility] Pager- [phone] 12/13/2018

## 2018-12-25 DIAGNOSIS — Z9889 Other specified postprocedural states: Secondary | ICD-10-CM | POA: Insufficient documentation

## 2019-01-05 ENCOUNTER — Emergency Department: Payer: Medicare HMO

## 2019-01-05 ENCOUNTER — Inpatient Hospital Stay
Admission: EM | Admit: 2019-01-05 | Discharge: 2019-01-07 | DRG: 069 | Disposition: A | Discharge status: Home / Self Care | Payer: Medicare HMO | Attending: Internal Medicine | Admitting: Internal Medicine

## 2019-01-05 ENCOUNTER — Encounter: Payer: Self-pay | Admitting: Emergency Medicine

## 2019-01-05 ENCOUNTER — Other Ambulatory Visit: Payer: Self-pay

## 2019-01-05 DIAGNOSIS — R079 Chest pain, unspecified: Secondary | ICD-10-CM | POA: Diagnosis not present

## 2019-01-05 DIAGNOSIS — Z7901 Long term (current) use of anticoagulants: Secondary | ICD-10-CM

## 2019-01-05 DIAGNOSIS — Z91041 Radiographic dye allergy status: Secondary | ICD-10-CM

## 2019-01-05 DIAGNOSIS — I5022 Chronic systolic (congestive) heart failure: Secondary | ICD-10-CM | POA: Diagnosis present

## 2019-01-05 DIAGNOSIS — E538 Deficiency of other specified B group vitamins: Secondary | ICD-10-CM | POA: Diagnosis present

## 2019-01-05 DIAGNOSIS — I2510 Atherosclerotic heart disease of native coronary artery without angina pectoris: Secondary | ICD-10-CM | POA: Diagnosis present

## 2019-01-05 DIAGNOSIS — D6859 Other primary thrombophilia: Secondary | ICD-10-CM

## 2019-01-05 DIAGNOSIS — R531 Weakness: Secondary | ICD-10-CM | POA: Diagnosis not present

## 2019-01-05 DIAGNOSIS — E785 Hyperlipidemia, unspecified: Secondary | ICD-10-CM | POA: Diagnosis present

## 2019-01-05 DIAGNOSIS — I480 Paroxysmal atrial fibrillation: Secondary | ICD-10-CM | POA: Diagnosis present

## 2019-01-05 DIAGNOSIS — Z20828 Contact with and (suspected) exposure to other viral communicable diseases: Secondary | ICD-10-CM | POA: Diagnosis present

## 2019-01-05 DIAGNOSIS — Z888 Allergy status to other drugs, medicaments and biological substances status: Secondary | ICD-10-CM

## 2019-01-05 DIAGNOSIS — G459 Transient cerebral ischemic attack, unspecified: Secondary | ICD-10-CM | POA: Diagnosis not present

## 2019-01-05 DIAGNOSIS — Z955 Presence of coronary angioplasty implant and graft: Secondary | ICD-10-CM

## 2019-01-05 DIAGNOSIS — Z8249 Family history of ischemic heart disease and other diseases of the circulatory system: Secondary | ICD-10-CM

## 2019-01-05 DIAGNOSIS — Z79899 Other long term (current) drug therapy: Secondary | ICD-10-CM

## 2019-01-05 DIAGNOSIS — Z9181 History of falling: Secondary | ICD-10-CM

## 2019-01-05 DIAGNOSIS — Z9842 Cataract extraction status, left eye: Secondary | ICD-10-CM

## 2019-01-05 DIAGNOSIS — Z87891 Personal history of nicotine dependence: Secondary | ICD-10-CM

## 2019-01-05 DIAGNOSIS — M1990 Unspecified osteoarthritis, unspecified site: Secondary | ICD-10-CM | POA: Diagnosis present

## 2019-01-05 DIAGNOSIS — I110 Hypertensive heart disease with heart failure: Secondary | ICD-10-CM | POA: Diagnosis present

## 2019-01-05 DIAGNOSIS — J8489 Other specified interstitial pulmonary diseases: Secondary | ICD-10-CM

## 2019-01-05 DIAGNOSIS — R55 Syncope and collapse: Secondary | ICD-10-CM

## 2019-01-05 DIAGNOSIS — K219 Gastro-esophageal reflux disease without esophagitis: Secondary | ICD-10-CM | POA: Diagnosis present

## 2019-01-05 DIAGNOSIS — I25709 Atherosclerosis of coronary artery bypass graft(s), unspecified, with unspecified angina pectoris: Secondary | ICD-10-CM | POA: Diagnosis present

## 2019-01-05 DIAGNOSIS — I255 Ischemic cardiomyopathy: Secondary | ICD-10-CM | POA: Diagnosis present

## 2019-01-05 DIAGNOSIS — I421 Obstructive hypertrophic cardiomyopathy: Secondary | ICD-10-CM

## 2019-01-05 DIAGNOSIS — R42 Dizziness and giddiness: Secondary | ICD-10-CM | POA: Diagnosis not present

## 2019-01-05 DIAGNOSIS — G40409 Other generalized epilepsy and epileptic syndromes, not intractable, without status epilepticus: Secondary | ICD-10-CM

## 2019-01-05 DIAGNOSIS — Z86711 Personal history of pulmonary embolism: Secondary | ICD-10-CM

## 2019-01-05 DIAGNOSIS — I252 Old myocardial infarction: Secondary | ICD-10-CM

## 2019-01-05 DIAGNOSIS — F410 Panic disorder [episodic paroxysmal anxiety] without agoraphobia: Secondary | ICD-10-CM | POA: Diagnosis present

## 2019-01-05 DIAGNOSIS — Z885 Allergy status to narcotic agent status: Secondary | ICD-10-CM

## 2019-01-05 DIAGNOSIS — Z961 Presence of intraocular lens: Secondary | ICD-10-CM | POA: Diagnosis present

## 2019-01-05 DIAGNOSIS — I779 Disorder of arteries and arterioles, unspecified: Secondary | ICD-10-CM | POA: Diagnosis present

## 2019-01-05 LAB — BASIC METABOLIC PANEL
Anion gap: 10 (ref 5–15)
BUN: 22 mg/dL (ref 8–23)
CO2: 23 mmol/L (ref 22–32)
Calcium: 8.7 mg/dL — ABNORMAL LOW (ref 8.9–10.3)
Chloride: 105 mmol/L (ref 98–111)
Creatinine, Ser: 1.2 mg/dL (ref 0.61–1.24)
GFR calc Af Amer: >60 mL/min (ref >60)
GFR calc non Af Amer: 58 mL/min — ABNORMAL LOW (ref >60)
Glucose, Bld: 96 mg/dL (ref 70–99)
Potassium: 4.2 mmol/L (ref 3.5–5.1)
Sodium: 138 mmol/L (ref 135–145)

## 2019-01-05 LAB — CBC
HCT: 43.1 % (ref 39.0–52.0)
Hemoglobin: 15.1 g/dL (ref 13.0–17.0)
MCH: 32.1 pg (ref 26.0–34.0)
MCHC: 35 g/dL (ref 30.0–36.0)
MCV: 91.7 fL (ref 80.0–100.0)
Platelets: 203 10*3/uL (ref 150–400)
RBC: 4.7 MIL/uL (ref 4.22–5.81)
RDW: 13.4 % (ref 11.5–15.5)
WBC: 4.9 10*3/uL (ref 4.0–10.5)
nRBC: 0 % (ref 0.0–0.2)

## 2019-01-05 LAB — VITAMIN B12: Vitamin B-12: 182 pg/mL (ref 180–914)

## 2019-01-05 LAB — TROPONIN I (HIGH SENSITIVITY)
Troponin I (High Sensitivity): 16 ng/L (ref <18)
Troponin I (High Sensitivity): 18 ng/L — ABNORMAL HIGH (ref <18)

## 2019-01-05 LAB — TSH: TSH: 1.799 u[IU]/mL (ref 0.350–4.500)

## 2019-01-05 MED ORDER — EZETIMIBE 10 MG PO TABS
10.0000 mg | ORAL_TABLET | Freq: Every day | ORAL | Status: DC
  Administered 2019-01-06 – 2019-01-07 (×2): 10 mg via ORAL
  Filled 2019-01-05 (×3): qty 1

## 2019-01-05 MED ORDER — RANOLAZINE ER 500 MG PO TB12
1000.0000 mg | ORAL_TABLET | Freq: Two times a day (BID) | ORAL | Status: DC
  Administered 2019-01-05 – 2019-01-07 (×4): 1000 mg via ORAL
  Filled 2019-01-05 (×6): qty 2

## 2019-01-05 MED ORDER — RIVAROXABAN 20 MG PO TABS
20.0000 mg | ORAL_TABLET | Freq: Every day | ORAL | Status: DC
  Administered 2019-01-06 – 2019-01-07 (×2): 20 mg via ORAL
  Filled 2019-01-05 (×2): qty 1

## 2019-01-05 MED ORDER — ATORVASTATIN CALCIUM 20 MG PO TABS
80.0000 mg | ORAL_TABLET | Freq: Every day | ORAL | Status: DC
  Administered 2019-01-06: 18:00:00 80 mg via ORAL
  Filled 2019-01-05: qty 4

## 2019-01-05 MED ORDER — SODIUM CHLORIDE 0.9% FLUSH: 3.0000 mL | Freq: Once | INTRAVENOUS | Status: DC

## 2019-01-05 MED ORDER — ISOSORBIDE MONONITRATE ER 30 MG PO TB24
60.0000 mg | ORAL_TABLET | Freq: Every day | ORAL | Status: DC
  Administered 2019-01-06 – 2019-01-07 (×2): 60 mg via ORAL
  Filled 2019-01-05 (×2): qty 2

## 2019-01-05 NOTE — ED Notes (Signed)
Patient transported to CT 

## 2019-01-05 NOTE — ED Triage Notes (Signed)
Pt to ED via [facility] from home for chest pain that started last night. Pt had to use 3 of his Nitro last night. Pt states that he pain went away and he was able to go to bed. This morning he states that he got up and went out to get the paper. Pt was reading the paper and felt ok, when he got up the room was spinning. Pt states that he was very weak and felt off balance. Pt appears to be having some trouble getting this thoughts out. Pt is currently on blood thinners. Denies recent fall. Pt states that he is currently having pain in his central chest, pt gets "shocks" of pain into his chest as well. Pt states that he does not have defibrillator. Pt anxious on arrival to ED, pt is in NAD. Pt is alert, unable to recall the day of the week but is able to recall year.

## 2019-01-05 NOTE — ED Notes (Signed)
Pt states he was having visual changes but is not at present. Pt alert and oriented x4, speaking appropriately

## 2019-01-05 NOTE — ED Provider Notes (Addendum)
[facility] Emergency Department Provider Note   ____________________________________________   First MD Initiated Contact with Patient 01/05/19 1038     (approximate)  I have reviewed the triage vital signs and the nursing notes.   HISTORY  Chief Complaint Chest Pain    HPI Scott [name] is a 78 y.o. male who comes in reporting some dizziness yesterday today he was sitting in his chair and developed some more dizziness and chest pain.  Did not seem to radiate.  When he tried to get up he was very weak and could not stand.  He tried several times and finally had to get his wife to help him to try to get up but he still could not get up and in fact pulled his wife down into the chair with him.  Initial blood pressure on arrival of EMS was [id number] systolic.  Patient reports he is never had a blood pressure that high.  Reports in the emergency room that he is seeing patterns on the white sheet that look like there and blurred braided.  He knows that there is nothing there but he is seeing it anyway.  I discussed him with Dr. Irish Elders the neurologist.  Although his pressure is lower now Dr. Irish Elders is worried about press or something similar.  We will get an MRI.         Past Medical History:  Diagnosis Date  . Arthritis    right hip, since a fall  . CAD (coronary artery disease)    a. 04/2012 Cath: 3VD->Med Rx;  b. 04/2013 PCI RCA (2 DES); c. 03/2014 PCI: LAD 80 (3.0x23 Xience [place] DES); d. 06/2014 CABG x 2 ([facility]) LIMA->LAD, VG->OM; e. 12/2014 Cath ([facility]): patent grafts->Med Rx; f. 08/2015 Cath: patent grafts; g. 11/2016 Cath: LM min irregs, LAD 20p, 36m, LCX 100p/m, RCA 20ost, 10p/m/d, VG->OM1 nl, LIMA->dLAD nl; f. 07/2017 MV: No isch, EF 51%.  . Cardiomyopathy, ischemic    a. 04/2012 Echo: EF 40-45%;  b. 08/2013 Echo: EF 45-50%; c. 01/2015 Echo: EF 40-45%; d. 07/2016 Echo: EF 60-65%; e. 09/2017 Echo: EF 55-60%, Gr1 DD.  . Carotid arterial disease ([place])    a. 05/2013  Carotid U/S; bilat 40-50% ICA stenosis.  . Chronic Chest Pain    USES NITRO  . Chronic systolic CHF (congestive heart failure) ([place])    a. 01/2015 Echo: EF 40-45%; b. 07/2016 Echo: EF 60-65%; c. 09/2017 Echo: EF 55-60%, Gr1 DD, nl RV fxn.  . Cough   . Diverticulosis   . Dizziness   . Dyspnea    WITH EXERTION  . Headache 1970's   migraine  . History of blood transfusion 2016   post op  . Hyperlipidemia   . Hypertension   . Iron deficiency anemia due to chronic blood loss 09/11/2016  . Myocardial infarction [facility])    unsure of when  . Pulmonary emboli ([place]) 07/2016   a. On Xarelto.  . Reflux esophagitis   . Sternal pain    a. 03/2016 s/p Sternal wire removal; b. 05/2016 s/p redo median sternotomy for sternal debridement and sternal plating.  . Syncope 04/2018   CARDIOLOGIST WAS DR Rockey Situ AND HE WAS AWARE-NOW PT SEES PARASCHOS  . Tubular adenoma of colon     Patient Active Problem List   Diagnosis Date Noted  . Weakness 01/05/2019  . Dizziness 12/07/2017  . Orthostatic hypotension 12/07/2017  . Chest pain 10/02/2017  . Syncope 11/19/2016  . 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  Iron deficiency anemia due to chronic blood loss 09/11/2016  . Pulmonary embolus ([place]) 08/31/2016  . History of pulmonary embolism 08/16/2016  . 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 Panic disorder   . 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 Panic attack 08/04/2016  . 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 Coronary artery disease involving native coronary artery of native heart with angina pectoris with documented spasm ([place])   . Hx of CABG   . Ischemic cardiomyopathy   . Chronic systolic CHF (congestive heart failure) ([place])   . Chronic Chest Pain   . Coronary artery disease involving coronary bypass graft of native heart with angina pectoris with documented spasm ([place])   . Angina pectoris ([place])   . Anxiety   . Hyperlipemia   . Chronic diastolic heart failure ([place]) 12/21/2014  . Unstable  angina ([place]) 12/08/2014  . PAF (paroxysmal atrial fibrillation) ([place]) 12/08/2014  . Chronic anticoagulation 12/08/2014  . Anemia 04/20/2014  . 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 Cardiomyopathy, ischemic   . Essential hypertension   . Bilateral carotid artery disease ([place])   . S/P drug eluting coronary stent placement 05/30/2013  . Chest pain with low risk for cardiac etiology 05/05/2013  . SOB (shortness of breath) 05/05/2013  . Coronary artery disease involving coronary bypass graft of native heart with angina pectoris ([place]) 05/05/2013  . Hyperlipidemia 05/05/2013  . History of smoking 05/05/2013  . Cardiac angina ([place]) 05/05/2013    Past Surgical History:  Procedure Laterality Date  . CARDIAC CATHETERIZATION  05/01/2012  . CARDIAC CATHETERIZATION  04/2013   [facility];x3 stent  . CARDIAC CATHETERIZATION  01/11/15    [facility]  . CARDIAC CATHETERIZATION N/A 09/16/2015   Procedure: LEFT HEART CATH AND CORS/GRAFTS ANGIOGRAPHY;  Surgeon: Minna Merritts, MD;  Location: [place] [facility];  Service: Cardiovascular;  Laterality: N/A;  . CARPAL TUNNEL RELEASE     right hand  . CATARACT EXTRACTION     LEFT  . CATARACT EXTRACTION W/PHACO Left 09/16/2014   Procedure: CATARACT EXTRACTION PHACO AND INTRAOCULAR LENS PLACEMENT (IOC);  Surgeon: Leandrew Koyanagi, MD;  Location: [place];  Service: Ophthalmology;  Laterality: Left;  . COLONOSCOPY    . COLONOSCOPY WITH PROPOFOL N/A 01/28/2016   Procedure: COLONOSCOPY WITH PROPOFOL;  Surgeon: Lollie Sails, MD;  Location: [facility] ENDOSCOPY;  Service: Endoscopy;  Laterality: N/A;  . CORONARY ANGIOPLASTY WITH STENT PLACEMENT  04/13/2014  . CORONARY ARTERY BYPASS GRAFT  06-26-14   x3 bypasses  . CORONARY PRESSURE WIRE/FFR WITH 3D MAPPING N/A 12/10/2018   Procedure: Suzette Battiest PRESSURE WIRE/FFR STUDY;  Surgeon: Isaias Cowman, MD;  Location: [place] [facility];  Service: Cardiovascular;  Laterality: N/A;  . CYSTOSCOPY N/A 08/14/2018    Procedure: CYSTOSCOPY;  Surgeon: Abbie Sons, MD;  Location: [facility] ORS;  Service: Urology;  Laterality: N/A;  . DE QUERVAIN'S RELEASE Left 08/22/2012  . ESOPHAGOGASTRODUODENOSCOPY (EGD) WITH PROPOFOL N/A 04/26/2015   Procedure: ESOPHAGOGASTRODUODENOSCOPY (EGD) WITH PROPOFOL;  Surgeon: Hulen Luster, MD;  Location: [facility] ENDOSCOPY;  Service: Gastroenterology;  Laterality: N/A;  . ESOPHAGOGASTRODUODENOSCOPY (EGD) WITH PROPOFOL N/A 05/29/2017   Procedure: ESOPHAGOGASTRODUODENOSCOPY (EGD) WITH PROPOFOL;  Surgeon: Lollie Sails, MD;  Location: [facility] ENDOSCOPY;  Service: Endoscopy;  Laterality: N/A;  . INTRAVASCULAR ULTRASOUND/IVUS N/A 12/10/2018   Procedure: Intravascular Ultrasound/IVUS;  Surgeon: Isaias Cowman, MD;  Location: [place] [facility];  Service: Cardiovascular;  Laterality: N/A;  . LEFT HEART CATH AND CORONARY ANGIOGRAPHY N/A 12/04/2016   Procedure: LEFT HEART CATH  AND CORS/GRAFTS  ANGIOGRAPHY;  Surgeon: Wellington Hampshire, MD;  Location: [place] [facility];  Service: Cardiovascular;  Laterality: N/A;  . LEFT HEART CATH AND CORS/GRAFTS ANGIOGRAPHY N/A 04/09/2018   Procedure: LEFT HEART CATH AND CORS/GRAFTS ANGIOGRAPHY;  Surgeon: Isaias Cowman, MD;  Location: [place] [facility];  Service: Cardiovascular;  Laterality: N/A;  . LEFT HEART CATH AND CORS/GRAFTS ANGIOGRAPHY N/A 12/10/2018   Procedure: LEFT HEART CATH AND CORS/GRAFTS ANGIOGRAPHY;  Surgeon: Isaias Cowman, MD;  Location: [place] [facility];  Service: Cardiovascular;  Laterality: N/A;  . RIB PLATING N/A 05/25/2016   Procedure: STERNAL PLATING;  Surgeon: Melrose Nakayama, MD;  Location: [place];  Service: Thoracic;  Laterality: N/A;  . right shoulder    . STERNAL WIRES REMOVAL N/A 03/30/2016   Procedure: STERNAL WIRES REMOVAL;  Surgeon: Melrose Nakayama, MD;  Location: [place];  Service: Thoracic;  Laterality: N/A;  . TONSILLECTOMY      Prior to Admission medications   Medication Sig Start Date End Date  Taking? Authorizing Provider  atorvastatin (LIPITOR) 80 MG tablet Take 1 tablet (80 mg total) by mouth daily at 6 PM. 10/04/17   Gouru, Aruna, MD  ezetimibe (ZETIA) 10 MG tablet Take 1 tablet (10 mg total) by mouth daily. 12/07/17   Minna Merritts, MD  isosorbide mononitrate (IMDUR) 30 MG 24 hr tablet Take 60 mg by mouth daily.  05/13/18 05/13/19  [provider]  nitroGLYCERIN (NITROSTAT) 0.4 MG SL tablet Place 1 tablet (0.4 mg total) under the tongue every 5 (five) minutes as needed for chest pain. 12/07/17   Minna Merritts, MD  ranolazine (RANEXA) 1000 MG SR tablet Take 1 tablet (1,000 mg total) by mouth 2 (two) times daily. 12/07/17   Minna Merritts, MD  rivaroxaban (XARELTO) 20 MG TABS tablet Take 1 tablet (20 mg total) by mouth daily. 08/14/17   Minna Merritts, MD    Allergies Contrast media [iodinated diagnostic agents], Metrizamide, Oxycodone hcl, and Vicodin [hydrocodone-acetaminophen]  Family History  Problem Relation Age of Onset  . Heart attack Father 60       MI  . Cancer Maternal Uncle     Social History Social History   Tobacco Use  . Smoking status: Former Smoker    Packs/day: 1.00    Years: 35.00    Pack years: 35.00    Types: Cigarettes    Start date: 05/02/2009  . Smokeless tobacco: Former Network engineer Use Topics  . Alcohol use: No  . Drug use: No    Review of Systems  Constitutional: No fever/chills Eyes: No visual changes. ENT: No sore throat. Cardiovascular: Denies chest pain. Respiratory: Denies shortness of breath. Gastrointestinal: Suprapubic abdominal pain no nausea, no vomiting.  No diarrhea.  No constipation. Genitourinary: Negative for dysuria unable to pass urine just dribbles. Musculoskeletal: Negative for back pain. Skin: Negative for rash. Neurological: Negative for headaches, focal weakness   ____________________________________________   PHYSICAL EXAM:  VITAL SIGNS: ED Triage Vitals  Enc Vitals Group     BP  01/05/19 1035 (!) 161/100     Pulse Rate 01/05/19 1035 61     Resp 01/05/19 1035 20     Temp 01/05/19 1035 98.3 F (36.8 C)     Temp Source 01/05/19 1035 Oral     SpO2 01/05/19 1035 100 %     Weight 01/05/19 1036 176 lb 12.8 oz (80.2 kg)     Height 01/05/19 1036 5\' 10"  (1.778 m)  Head Circumference --      Peak Flow --      Pain Score 01/05/19 1035 2     Pain Loc --      Pain Edu? --      Excl. in [place]? --     Constitutional: Alert and oriented.  Looks well Eyes: Conjunctivae are normal. Head: Atraumatic. Nose: No congestion/rhinnorhea. Mouth/Throat: Mucous membranes are moist.  Oropharynx non-erythematous. Neck: No stridor.  Cardiovascular: Normal rate, regular rhythm. Grossly normal heart sounds.  Good peripheral circulation. Respiratory: Normal respiratory effort.  No retractions. Lungs CTAB. Gastrointestinal: Soft and nontender. No distention. No abdominal bruits. No CVA tenderness. .   Musculoskeletal: No lower extremity tenderness  Neurologic:  Normal speech and language. No gross focal neurologic deficits are appreciated.  Skin:  Skin is warm, dry and intact. No rash noted.   ____________________________________________   LABS (all labs ordered are listed, but only abnormal results are displayed)  Labs Reviewed  BASIC METABOLIC PANEL - Abnormal; Notable for the following components:      Result Value   Calcium 8.7 (*)    GFR calc non Af Amer 58 (*)    All other components within normal limits  TROPONIN I (HIGH SENSITIVITY) - Abnormal; Notable for the following components:   Troponin I (High Sensitivity) 18 (*)    All other components within normal limits  SARS CORONAVIRUS 2 (TAT 6-24 HRS)  CBC  TSH  VITAMIN B12  TROPONIN I (HIGH SENSITIVITY)   ____________________________________________  EKG   ____________________________________________  RADIOLOGY  ED MD interpretation:  Official radiology report(s): DG Chest 2 View  Result Date:  01/05/2019 CLINICAL DATA:  Chest pain that started last night, weakness this morning. Pt states that he is currently having pain in his central chest, pt gets "shocks" of pain into his chest as well. Hx of CAD, cardiomyopathy, CHF, HTN, MI, pulmonary emboli, cardiac cath, CABG, coronary pressure wire, sternal plating- 2018. Former smoker. EXAM: CHEST - 2 VIEW COMPARISON:  11/09/2017. FINDINGS: Stable changes from previous CABG surgery. Cardiac silhouette is normal in size and configuration. No mediastinal or hilar masses. No evidence of adenopathy. Mild prominence of the interstitial markings at the bases. Lungs otherwise clear. No pleural effusion or pneumothorax. Skeletal structures are intact. IMPRESSION: No acute cardiopulmonary disease. Electronically Signed   By: Lajean Manes M.D.   On: 01/05/2019 11:38   CT Head Wo Contrast  Result Date: 01/05/2019 CLINICAL DATA:  Pt to ED via [facility] from home for chest pain that started last night. Pt had to use 3 of his Nitro last night. Pt states that he pain went away and he was able to go to bed. This morning he states that he got up and went out to get the paper. Pt was reading the paper and felt ok, when he got up the room was spinning. Pt states that he was very weak and felt off balance. Pt appears to be having some trouble getting this thoughts out. EXAM: CT HEAD WITHOUT CONTRAST TECHNIQUE: Contiguous axial images were obtained from the base of the skull through the vertex without intravenous contrast. COMPARISON:  02/01/2018 FINDINGS: Brain: No evidence of acute infarction, hemorrhage, hydrocephalus, extra-axial collection or mass lesion/mass effect. Vascular: No hyperdense vessel or unexpected calcification. Skull: Normal. Negative for fracture or focal lesion. Sinuses/Orbits: Globes and orbits are unremarkable. Visualized sinuses and mastoid air cells are clear. Other: None. IMPRESSION: 1. Normal CT scan of the brain for age. No change from the prior  study.  Electronically Signed   By: Lajean Manes M.D.   On: 01/05/2019 11:28    ____________________________________________   PROCEDURES  Procedure(s) performed (including Critical Care):  Procedures   ____________________________________________   INITIAL IMPRESSION / ASSESSMENT AND PLAN / ED COURSE Patient with episode of severe chest pain with hypertension and unable to stand because of weakness.  Patient is troponins going up slightly but the history is very worrisome.  I will get him in the hospital on watch him carefully tonight.  MRI is pending still.  He is doing quite well though at this point.               ____________________________________________   FINAL CLINICAL IMPRESSION(S) / ED DIAGNOSES  Final diagnoses:  Chest pain, unspecified type  Dizziness     ED Discharge Orders    None       Note:  This document was prepared using Dragon voice recognition software and may include unintentional dictation errors.    Nena Polio, MD 01/05/19 1531    Nena Polio, MD 01/05/19 [id number]

## 2019-01-05 NOTE — H&P (Addendum)
History and Physical    Scott [name] [id number] DOB: [birth year] DOA: 01/05/2019  I have briefly reviewed the patient's prior medical records in [place]  PCP: Baxter Hire, MD  Patient coming from: Home  Chief Complaint: Weakness  HPI: Scott [name] is a 78 y.o. 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  Patient reports several episodes of sharp chest pain last night as well as this morning, resolved on its own.  During the middle of his chest without radiation.  There were not associated with shortness of breath.  He denies any recent fever or chills, no sick contacts, no sore throat, chest congestion.  This morning he also noticed that he is unable to stand, does not feel weak but feels like he cannot coordinate his body well.  He also has been having visual changes, when looking at the bed sheets which were white he would see floral patterns, this currently has resolved.  His speech seems a bit pressured, he is repetitive, and tells me that "he cannot get his thoughts together".  He knows what he needs to say words 1,.  He denies any focal weakness, denies any numbness or tingling in arms, feet or face.  This is never happened to him before.  He does not smoke currently and he does not drink alcohol.  There are no new medication changes.  In addition, patient and his wife reports that in the last several months he has started to have spells in which he feels completely out staring in space for a few seconds and not interacting with people around him.  This can happen several times per week  ED Course: In the ED he is afebrile, blood pressure ranging between 111 and 160, and he is satting 100% on room air.  His blood work shows flat high-sensitivity troponin  initial value was 16 followed by 18 at 2 hours, BMP and CBC fairly unremarkable.  SARS-CoV-2 pending.  CT scan of the brain without acute findings.  Chest x-ray without active disease.  His EKG shows sinus rhythm.  Review of Systems: All systems reviewed, and apart from HPI, all negative  Past Medical History:  Diagnosis Date  . Arthritis    right hip, since a fall  . CAD (coronary artery disease)    a. 04/2012 Cath: 3VD->Med Rx;  b. 04/2013 PCI RCA (2 DES); c. 03/2014 PCI: LAD 80 (3.0x23 Xience [place]pine DES); d. 06/2014 CABG x 2 ([facility]) LIMA->LAD, VG->OM; e. 12/2014 Cath ([facility]): patent grafts->Med Rx; f. 08/2015 Cath: patent grafts; g. 11/2016 Cath: LM min irregs, LAD 20p, 60m, LCX 100p/m, RCA 20ost, 10p/m/d, VG->OM1 nl, LIMA->dLAD nl; f. 07/2017 MV: No isch, EF 51%.  . Cardiomyopathy, ischemic    a. 04/2012 Echo: EF 40-45%;  b. 08/2013 Echo: EF 45-50%; c. 01/2015 Echo: EF 40-45%; d. 07/2016 Echo: EF 60-65%; e. 09/2017 Echo: EF 55-60%, Gr1 DD.  . Carotid arterial disease ([place])    a. 05/2013 Carotid U/S; bilat 40-50% ICA stenosis.  . Chronic Chest Pain    USES NITRO  . Chronic systolic CHF (congestive heart failure) ([place])    a. 01/2015 Echo: EF 40-45%; b. 07/2016 Echo: EF 60-65%; c. 09/2017 Echo: EF 55-60%, Gr1 DD, nl RV fxn.  . Cough   . Diverticulosis   . Dizziness   .  Dyspnea    WITH EXERTION  . Headache 1970's   migraine  . History of blood transfusion 2016   post op  . Hyperlipidemia   . Hypertension   . Iron deficiency anemia due to chronic blood loss 09/11/2016  . Myocardial infarction [facility])    unsure of when  . Pulmonary emboli ([place]) 07/2016   a. On Xarelto.  . Reflux esophagitis   . Sternal pain    a. 03/2016 s/p Sternal wire removal; b. 05/2016 s/p redo median sternotomy for sternal debridement and sternal plating.  . Syncope 04/2018   CARDIOLOGIST WAS DR Rockey Situ AND HE WAS AWARE-NOW PT SEES PARASCHOS  . Tubular adenoma of colon     Past Surgical History:  Procedure Laterality Date  .  CARDIAC CATHETERIZATION  05/01/2012  . CARDIAC CATHETERIZATION  04/2013   [facility];x3 stent  . CARDIAC CATHETERIZATION  01/11/15    [facility]  . CARDIAC CATHETERIZATION N/A 09/16/2015   Procedure: LEFT HEART CATH AND CORS/GRAFTS ANGIOGRAPHY;  Surgeon: Minna Merritts, MD;  Location: [place] [facility];  Service: Cardiovascular;  Laterality: N/A;  . CARPAL TUNNEL RELEASE     right hand  . CATARACT EXTRACTION     LEFT  . CATARACT EXTRACTION W/PHACO Left 09/16/2014   Procedure: CATARACT EXTRACTION PHACO AND INTRAOCULAR LENS PLACEMENT (IOC);  Surgeon: Leandrew Koyanagi, MD;  Location: [place];  Service: Ophthalmology;  Laterality: Left;  . COLONOSCOPY    . COLONOSCOPY WITH PROPOFOL N/A 01/28/2016   Procedure: COLONOSCOPY WITH PROPOFOL;  Surgeon: Lollie Sails, MD;  Location: [facility] ENDOSCOPY;  Service: Endoscopy;  Laterality: N/A;  . CORONARY ANGIOPLASTY WITH STENT PLACEMENT  04/13/2014  . CORONARY ARTERY BYPASS GRAFT  06-26-14   x3 bypasses  . CORONARY PRESSURE WIRE/FFR WITH 3D MAPPING N/A 12/10/2018   Procedure: Suzette Battiest PRESSURE WIRE/FFR STUDY;  Surgeon: Isaias Cowman, MD;  Location: [place] [facility];  Service: Cardiovascular;  Laterality: N/A;  . CYSTOSCOPY N/A 08/14/2018   Procedure: CYSTOSCOPY;  Surgeon: Abbie Sons, MD;  Location: [facility];  Service: Urology;  Laterality: N/A;  . DE QUERVAIN'S RELEASE Left 08/22/2012  . ESOPHAGOGASTRODUODENOSCOPY (EGD) WITH PROPOFOL N/A 04/26/2015   Procedure: ESOPHAGOGASTRODUODENOSCOPY (EGD) WITH PROPOFOL;  Surgeon: Hulen Luster, MD;  Location: [facility] ENDOSCOPY;  Service: Gastroenterology;  Laterality: N/A;  . ESOPHAGOGASTRODUODENOSCOPY (EGD) WITH PROPOFOL N/A 05/29/2017   Procedure: ESOPHAGOGASTRODUODENOSCOPY (EGD) WITH PROPOFOL;  Surgeon: Lollie Sails, MD;  Location: [facility] ENDOSCOPY;  Service: Endoscopy;  Laterality: N/A;  . INTRAVASCULAR ULTRASOUND/IVUS N/A 12/10/2018   Procedure: Intravascular Ultrasound/IVUS;  Surgeon: Isaias Cowman, MD;  Location: [place] [facility];  Service: Cardiovascular;  Laterality: N/A;  . LEFT HEART CATH AND CORONARY ANGIOGRAPHY N/A 12/04/2016   Procedure: LEFT HEART CATH AND CORS/GRAFTS  ANGIOGRAPHY;  Surgeon: Wellington Hampshire, MD;  Location: [place] [facility];  Service: Cardiovascular;  Laterality: N/A;  . LEFT HEART CATH AND CORS/GRAFTS ANGIOGRAPHY N/A 04/09/2018   Procedure: LEFT HEART CATH AND CORS/GRAFTS ANGIOGRAPHY;  Surgeon: Isaias Cowman, MD;  Location: [place] [facility];  Service: Cardiovascular;  Laterality: N/A;  . LEFT HEART CATH AND CORS/GRAFTS ANGIOGRAPHY N/A 12/10/2018   Procedure: LEFT HEART CATH AND CORS/GRAFTS ANGIOGRAPHY;  Surgeon: Isaias Cowman, MD;  Location: [place] [facility];  Service: Cardiovascular;  Laterality: N/A;  . RIB PLATING N/A 05/25/2016   Procedure: STERNAL PLATING;  Surgeon: Melrose Nakayama, MD;  Location: [place];  Service: Thoracic;  Laterality: N/A;  . right shoulder    .  STERNAL WIRES REMOVAL N/A 03/30/2016   Procedure: STERNAL WIRES REMOVAL;  Surgeon: Melrose Nakayama, MD;  Location: [place];  Service: Thoracic;  Laterality: N/A;  . TONSILLECTOMY       reports that he has quit smoking. His smoking use included cigarettes. He started smoking about 9 years ago. He has a 35.00 pack-year smoking history. He has quit using smokeless tobacco. He reports that he does not drink alcohol or use drugs.  Allergies  Allergen Reactions  . Contrast Media [Iodinated Diagnostic Agents] Hives  . Metrizamide Hives  . Oxycodone Hcl Itching  . Vicodin [Hydrocodone-Acetaminophen] Itching    Family History  Problem Relation Age of Onset  . Heart attack Father 72       MI  . Cancer Maternal Uncle     Prior to Admission medications   Medication Sig Start Date End Date Taking? Authorizing Provider  atorvastatin (LIPITOR) 80 MG tablet Take 1 tablet (80 mg total) by mouth daily at 6 PM. 10/04/17  Yes Gouru, Aruna, MD  ezetimibe (ZETIA)  10 MG tablet Take 1 tablet (10 mg total) by mouth daily. 12/07/17  Yes Minna Merritts, MD  ipratropium (ATROVENT) 0.06 % nasal spray Place 2 sprays into both nostrils 3 (three) times daily as needed for rhinitis or allergies. 12/31/18  Yes [provider]  isosorbide mononitrate (IMDUR) 60 MG 24 hr tablet Take 60 mg by mouth 2 (two) times daily.    Yes [provider]  nitroGLYCERIN (NITROSTAT) 0.4 MG SL tablet Place 1 tablet (0.4 mg total) under the tongue every 5 (five) minutes as needed for chest pain. 12/07/17  Yes Minna Merritts, MD  ranolazine (RANEXA) 1000 MG SR tablet Take 1 tablet (1,000 mg total) by mouth 2 (two) times daily. 12/07/17  Yes Gollan, [name], MD  rivaroxaban (XARELTO) 20 MG TABS tablet Take 1 tablet (20 mg total) by mouth daily. 08/14/17  Yes Minna Merritts, MD    Physical Exam: Vitals:   01/05/19 1415 01/05/19 1445 01/05/19 1600 01/05/19 1617  BP: 127/72 (!) 143/78  (!) 152/83  Pulse: 62 60 (!) 56 (!) 57  Resp: (!) 24 19 17 16   Temp:      TempSrc:      SpO2: 99% 100% 100% 99%  Weight:      Height:          Constitutional: NAD, calm, comfortable Eyes: PERRL, lids and conjunctivae normal ENMT: Mucous membranes are moist.  Neck: normal, supple Respiratory: clear to auscultation bilaterally, no wheezing, no crackles. Normal respiratory effort. No accessory muscle use.  Cardiovascular: Regular rate and rhythm, no murmurs / rubs / gallops. No extremity edema. 2+ pedal pulses.  Abdomen: no tenderness, no masses palpated. Bowel sounds positive.  Musculoskeletal: no clubbing / cyanosis. Normal muscle tone.  Skin: no rashes Neurologic: CN 2-12 grossly intact. Strength 5/5 in all 4.  Negative pronator drift. Psychiatric: Normal judgment and insight. Alert and oriented x 3. Normal mood.   Labs on Admission: I have personally reviewed following labs and imaging studies  CBC: Recent Labs  Lab 01/05/19 1031  WBC 4.9  HGB 15.1  HCT 43.1    MCV 91.7  PLT 123456   Basic Metabolic Panel: Recent Labs  Lab 01/05/19 1236  NA 138  K 4.2  CL 105  CO2 23  GLUCOSE 96  BUN 22  CREATININE 1.20  CALCIUM 8.7*   Liver Function Tests: No results for input(s): AST, ALT, ALKPHOS, BILITOT, PROT, ALBUMIN  in the last 168 hours. Coagulation Profile: No results for input(s): INR, PROTIME in the last 168 hours. BNP (last 3 results) No results for input(s): PROBNP in the last 8760 hours. CBG: No results for input(s): GLUCAP in the last 168 hours. Thyroid Function Tests: No results for input(s): TSH, T4TOTAL, FREET4, T3FREE, THYROIDAB in the last 72 hours. Urine analysis:    Component Value Date/Time   COLORURINE YELLOW (A) 12/08/2016 0938   APPEARANCEUR Hazy (A) 09/24/2018 1108   LABSPEC 1.015 12/08/2016 0938   PHURINE 6.0 12/08/2016 0938   GLUCOSEU Negative 09/24/2018 1108   HGBUR SMALL (A) 12/08/2016 0938   BILIRUBINUR Negative 09/24/2018 1108   KETONESUR NEGATIVE 12/08/2016 0938   PROTEINUR Negative 09/24/2018 1108   PROTEINUR NEGATIVE 12/08/2016 0938   NITRITE Negative 09/24/2018 1108   NITRITE NEGATIVE 12/08/2016 0938   LEUKOCYTESUR Negative 09/24/2018 1108     Radiological Exams on Admission: DG Chest 2 View  Result Date: 01/05/2019 CLINICAL DATA:  Chest pain that started last night, weakness this morning. Pt states that he is currently having pain in his central chest, pt gets "shocks" of pain into his chest as well. Hx of CAD, cardiomyopathy, CHF, HTN, MI, pulmonary emboli, cardiac cath, CABG, coronary pressure wire, sternal plating- 2018. Former smoker. EXAM: CHEST - 2 VIEW COMPARISON:  11/09/2017. FINDINGS: Stable changes from previous CABG surgery. Cardiac silhouette is normal in size and configuration. No mediastinal or hilar masses. No evidence of adenopathy. Mild prominence of the interstitial markings at the bases. Lungs otherwise clear. No pleural effusion or pneumothorax. Skeletal structures are intact.  IMPRESSION: No acute cardiopulmonary disease. Electronically Signed   By: Lajean Manes M.D.   On: 01/05/2019 11:38   CT Head Wo Contrast  Result Date: 01/05/2019 CLINICAL DATA:  Pt to ED via [facility] from home for chest pain that started last night. Pt had to use 3 of his Nitro last night. Pt states that he pain went away and he was able to go to bed. This morning he states that he got up and went out to get the paper. Pt was reading the paper and felt ok, when he got up the room was spinning. Pt states that he was very weak and felt off balance. Pt appears to be having some trouble getting this thoughts out. EXAM: CT HEAD WITHOUT CONTRAST TECHNIQUE: Contiguous axial images were obtained from the base of the skull through the vertex without intravenous contrast. COMPARISON:  02/01/2018 FINDINGS: Brain: No evidence of acute infarction, hemorrhage, hydrocephalus, extra-axial collection or mass lesion/mass effect. Vascular: No hyperdense vessel or unexpected calcification. Skull: Normal. Negative for fracture or focal lesion. Sinuses/Orbits: Globes and orbits are unremarkable. Visualized sinuses and mastoid air cells are clear. Other: None. IMPRESSION: 1. Normal CT scan of the brain for age. No change from the prior study. Electronically Signed   By: Lajean Manes M.D.   On: 01/05/2019 11:28   Assessment/Plan  Principal Problem Chest pain with underlying CAD, hyperlipidemia -High-sensitivity troponin essentially flat.  He was recently evaluated by cardiology with a cardiac catheterization last month with recommendations for medical therapy -Continue atorvastatin, Zetia, Imdur and Ranexa  Active Problems Weakness /word finding difficulties /visual changes -Unclear etiology, broad differential.  It does not seem like he has focal deficits but is more of a core body balance issues -MRI of the brain obtained, results pending, if negative will probably need an EEG given reported spells  History of  PE -Continue Xarelto  Chronic systolic CHF -Most  recent echo showed that the EF is normalized.  Appears euvolemic  DVT prophylaxis: On Xarelto Code Status: Full code Family Communication: Wife present at bedside Disposition Plan: Home when ready Bed Type: MedSurg Consults called: None Obs/Inp: Observation   Marzetta Board, MD, PhD [facility]  Contact via [email or web address]  01/05/2019, 5:14 PM

## 2019-01-05 NOTE — ED Notes (Signed)
Pt given a tray of food and something to drink. Pt denies any other needs.

## 2019-01-05 NOTE — ED Notes (Signed)
Patient transported to MRI 

## 2019-01-05 NOTE — ED Notes (Signed)
ED TO INPATIENT HANDOFF REPORT  ED Nurse Name and Phone #:  Dorothyann Gibbs [phone]  S Name/Age/Gender Scott [name] 78 y.o. male Room/Bed: ED34A/ED34A  Code Status   Code Status: Full Code  Home/SNF/Other Home Patient oriented to: self, place, time and situation Is this baseline? Yes   Triage Complete: Triage complete  Chief Complaint Weakness [R53.1]  Triage Note Pt to ED via [facility] from home for chest pain that started last night. Pt had to use 3 of his Nitro last night. Pt states that he pain went away and he was able to go to bed. This morning he states that he got up and went out to get the paper. Pt was reading the paper and felt ok, when he got up the room was spinning. Pt states that he was very weak and felt off balance. Pt appears to be having some trouble getting this thoughts out. Pt is currently on blood thinners. Denies recent fall. Pt states that he is currently having pain in his central chest, pt gets "shocks" of pain into his chest as well. Pt states that he does not have defibrillator. Pt anxious on arrival to ED, pt is in NAD. Pt is alert, unable to recall the day of the week but is able to recall year.       Allergies Allergies  Allergen Reactions  . Contrast Media [Iodinated Diagnostic Agents] Hives  . Metrizamide Hives  . Oxycodone Hcl Itching  . Vicodin [Hydrocodone-Acetaminophen] Itching    Level of Care/Admitting Diagnosis ED Disposition    ED Disposition Condition [facility] Area: [place] [[id number]]  Level of Care: Med-Surg [16]  Covid Evaluation: Asymptomatic Screening Protocol (No Symptoms)  Diagnosis: Weakness [id number]  Admitting Physician: Caren Griffins [phone]  Attending Physician: Caren Griffins [[id number]  PT Class (Do Not Modify): Observation [104]  PT Acc Code (Do Not Modify): Observation [10022]       B Medical/Surgery History Past Medical History:  Diagnosis Date  . Arthritis    right hip,  since a fall  . CAD (coronary artery disease)    a. 04/2012 Cath: 3VD->Med Rx;  b. 04/2013 PCI RCA (2 DES); c. 03/2014 PCI: LAD 80 (3.0x23 Xience Alpine DES); d. 06/2014 CABG x 2 ([facility]) LIMA->LAD, VG->OM; e. 12/2014 Cath ([facility]): patent grafts->Med Rx; f. 08/2015 Cath: patent grafts; g. 11/2016 Cath: LM min irregs, LAD 20p, 59m, LCX 100p/m, RCA 20ost, 10p/m/d, VG->OM1 nl, LIMA->dLAD nl; f. 07/2017 MV: No isch, EF 51%.  . Cardiomyopathy, ischemic    a. 04/2012 Echo: EF 40-45%;  b. 08/2013 Echo: EF 45-50%; c. 01/2015 Echo: EF 40-45%; d. 07/2016 Echo: EF 60-65%; e. 09/2017 Echo: EF 55-60%, Gr1 DD.  . Carotid arterial disease ([place])    a. 05/2013 Carotid U/S; bilat 40-50% ICA stenosis.  . Chronic Chest Pain    USES NITRO  . Chronic systolic CHF (congestive heart failure) ([place])    a. 01/2015 Echo: EF 40-45%; b. 07/2016 Echo: EF 60-65%; c. 09/2017 Echo: EF 55-60%, Gr1 DD, nl RV fxn.  . Cough   . Diverticulosis   . Dizziness   . Dyspnea    WITH EXERTION  . Headache 1970's   migraine  . History of blood transfusion 2016   post op  . Hyperlipidemia   . Hypertension   . Iron deficiency anemia due to chronic blood loss 09/11/2016  . Myocardial infarction [facility])    unsure of when  . Pulmonary emboli ([place])  07/2016   a. On Xarelto.  . Reflux esophagitis   . Sternal pain    a. 03/2016 s/p Sternal wire removal; b. 05/2016 s/p redo median sternotomy for sternal debridement and sternal plating.  . Syncope 04/2018   CARDIOLOGIST WAS DR Rockey Situ AND HE WAS AWARE-NOW PT SEES PARASCHOS  . Tubular adenoma of colon    Past Surgical History:  Procedure Laterality Date  . CARDIAC CATHETERIZATION  05/01/2012  . CARDIAC CATHETERIZATION  04/2013   [facility];x3 stent  . CARDIAC CATHETERIZATION  01/11/15    [facility]  . CARDIAC CATHETERIZATION N/A 09/16/2015   Procedure: LEFT HEART CATH AND CORS/GRAFTS ANGIOGRAPHY;  Surgeon: Minna Merritts, MD;  Location: [place] CV LAB;  Service: Cardiovascular;  Laterality: N/A;  . CARPAL TUNNEL  RELEASE     right hand  . CATARACT EXTRACTION     LEFT  . CATARACT EXTRACTION W/PHACO Left 09/16/2014   Procedure: CATARACT EXTRACTION PHACO AND INTRAOCULAR LENS PLACEMENT (IOC);  Surgeon: Leandrew Koyanagi, MD;  Location: [place];  Service: Ophthalmology;  Laterality: Left;  . COLONOSCOPY    . COLONOSCOPY WITH PROPOFOL N/A 01/28/2016   Procedure: COLONOSCOPY WITH PROPOFOL;  Surgeon: Lollie Sails, MD;  Location: [facility] ENDOSCOPY;  Service: Endoscopy;  Laterality: N/A;  . CORONARY ANGIOPLASTY WITH STENT PLACEMENT  04/13/2014  . CORONARY ARTERY BYPASS GRAFT  06-26-14   x3 bypasses  . CORONARY PRESSURE WIRE/FFR WITH 3D MAPPING N/A 12/10/2018   Procedure: Suzette Battiest PRESSURE WIRE/FFR STUDY;  Surgeon: Isaias Cowman, MD;  Location: [place] CV LAB;  Service: Cardiovascular;  Laterality: N/A;  . CYSTOSCOPY N/A 08/14/2018   Procedure: CYSTOSCOPY;  Surgeon: Abbie Sons, MD;  Location: [facility] ORS;  Service: Urology;  Laterality: N/A;  . DE QUERVAIN'S RELEASE Left 08/22/2012  . ESOPHAGOGASTRODUODENOSCOPY (EGD) WITH PROPOFOL N/A 04/26/2015   Procedure: ESOPHAGOGASTRODUODENOSCOPY (EGD) WITH PROPOFOL;  Surgeon: Hulen Luster, MD;  Location: [facility] ENDOSCOPY;  Service: Gastroenterology;  Laterality: N/A;  . ESOPHAGOGASTRODUODENOSCOPY (EGD) WITH PROPOFOL N/A 05/29/2017   Procedure: ESOPHAGOGASTRODUODENOSCOPY (EGD) WITH PROPOFOL;  Surgeon: Lollie Sails, MD;  Location: [facility] ENDOSCOPY;  Service: Endoscopy;  Laterality: N/A;  . INTRAVASCULAR ULTRASOUND/IVUS N/A 12/10/2018   Procedure: Intravascular Ultrasound/IVUS;  Surgeon: Isaias Cowman, MD;  Location: [place] CV LAB;  Service: Cardiovascular;  Laterality: N/A;  . LEFT HEART CATH AND CORONARY ANGIOGRAPHY N/A 12/04/2016   Procedure: LEFT HEART CATH AND CORS/GRAFTS  ANGIOGRAPHY;  Surgeon: Wellington Hampshire, MD;  Location: [place] CV LAB;  Service: Cardiovascular;  Laterality: N/A;  . LEFT HEART CATH AND CORS/GRAFTS  ANGIOGRAPHY N/A 04/09/2018   Procedure: LEFT HEART CATH AND CORS/GRAFTS ANGIOGRAPHY;  Surgeon: Isaias Cowman, MD;  Location: [place] CV LAB;  Service: Cardiovascular;  Laterality: N/A;  . LEFT HEART CATH AND CORS/GRAFTS ANGIOGRAPHY N/A 12/10/2018   Procedure: LEFT HEART CATH AND CORS/GRAFTS ANGIOGRAPHY;  Surgeon: Isaias Cowman, MD;  Location: [place] CV LAB;  Service: Cardiovascular;  Laterality: N/A;  . RIB PLATING N/A 05/25/2016   Procedure: STERNAL PLATING;  Surgeon: Melrose Nakayama, MD;  Location: [place];  Service: Thoracic;  Laterality: N/A;  . right shoulder    . STERNAL WIRES REMOVAL N/A 03/30/2016   Procedure: STERNAL WIRES REMOVAL;  Surgeon: Melrose Nakayama, MD;  Location: [place];  Service: Thoracic;  Laterality: N/A;  . TONSILLECTOMY       A IV Location/Drains/Wounds Patient Lines/Drains/Airways Status   Active Line/Drains/Airways    Name:   Placement date:   Placement time:  Site:   Days:   Airway   08/14/18    1547     144   Incision (Closed) 08/14/18 Penis Other (Comment)   08/14/18    1609     144          Intake/Output Last 24 hours No intake or output data in the 24 hours ending 01/05/19 1813  Labs/Imaging Results for orders placed or performed during the hospital encounter of 01/05/19 (from the past 48 hour(s))  CBC     Status: None   Collection Time: 01/05/19 10:31 AM  Result Value Ref Range   WBC 4.9 4.0 - 10.5 K/uL   RBC 4.70 4.22 - 5.81 MIL/uL   Hemoglobin 15.1 13.0 - 17.0 g/dL   HCT 43.1 39.0 - 52.0 %   MCV 91.7 80.0 - 100.0 fL   MCH 32.1 26.0 - 34.0 pg   MCHC 35.0 30.0 - 36.0 g/dL   RDW 13.4 11.5 - 15.5 %   Platelets 203 150 - 400 K/uL   nRBC 0.0 0.0 - 0.2 %    Comment: Performed at [facility], [address] [place], [place] [id number]  Basic metabolic panel     Status: Abnormal   Collection Time: 01/05/19 12:36 PM  Result Value Ref Range   Sodium 138 135 - 145 mmol/L   Potassium 4.2 3.5 - 5.1 mmol/L    Chloride 105 98 - 111 mmol/L   CO2 23 22 - 32 mmol/L   Glucose, Bld 96 70 - 99 mg/dL   BUN 22 8 - 23 mg/dL   Creatinine, Ser 1.20 0.61 - 1.24 mg/dL   Calcium 8.7 (L) 8.9 - 10.3 mg/dL   GFR calc non Af Amer 58 (L) >60 mL/min   GFR calc Af Amer >60 >60 mL/min   Anion gap 10 5 - 15    Comment: Performed at [facility], [place], [place] [address]  Troponin I (High Sensitivity)     Status: None   Collection Time: 01/05/19 12:36 PM  Result Value Ref Range   Troponin I (High Sensitivity) 16 <18 ng/L    Comment: (NOTE) Elevated high sensitivity troponin I (hsTnI) values and significant  changes across serial measurements may suggest ACS but many other  chronic and acute conditions are known to elevate hsTnI results.  Refer to the "Links" section for chest pain algorithms and additional  guidance. Performed at [facility], [place], [place] [address]   Troponin I (High Sensitivity)     Status: Abnormal   Collection Time: 01/05/19  2:37 PM  Result Value Ref Range   Troponin I (High Sensitivity) 18 (H) <18 ng/L    Comment: (NOTE) Elevated high sensitivity troponin I (hsTnI) values and significant  changes across serial measurements may suggest ACS but many other  chronic and acute conditions are known to elevate hsTnI results.  Refer to the "Links" section for chest pain algorithms and additional  guidance. Performed at [facility], [address] [place], [place] [address]   TSH     Status: None   Collection Time: 01/05/19  5:32 PM  Result Value Ref Range   TSH 1.799 0.350 - 4.500 uIU/mL    Comment: Performed by a 3rd Generation assay with a functional sensitivity of <=0.01 uIU/mL. Performed at [facility], [address] [place], [place] [address]    DG Chest 2 View  Result Date: 01/05/2019 CLINICAL DATA:  Chest pain that  started last night, weakness this morning. Pt states that he is currently having  pain in his central chest, pt gets "shocks" of pain into his chest as well. Hx of CAD, cardiomyopathy, CHF, HTN, MI, pulmonary emboli, cardiac cath, CABG, coronary pressure wire, sternal plating- 2018. Former smoker. EXAM: CHEST - 2 VIEW COMPARISON:  11/09/2017. FINDINGS: Stable changes from previous CABG surgery. Cardiac silhouette is normal in size and configuration. No mediastinal or hilar masses. No evidence of adenopathy. Mild prominence of the interstitial markings at the bases. Lungs otherwise clear. No pleural effusion or pneumothorax. Skeletal structures are intact. IMPRESSION: No acute cardiopulmonary disease. Electronically Signed   By: Lajean Manes M.D.   On: 01/05/2019 11:38   CT Head Wo Contrast  Result Date: 01/05/2019 CLINICAL DATA:  Pt to ED via [facility] from home for chest pain that started last night. Pt had to use 3 of his Nitro last night. Pt states that he pain went away and he was able to go to bed. This morning he states that he got up and went out to get the paper. Pt was reading the paper and felt ok, when he got up the room was spinning. Pt states that he was very weak and felt off balance. Pt appears to be having some trouble getting this thoughts out. EXAM: CT HEAD WITHOUT CONTRAST TECHNIQUE: Contiguous axial images were obtained from the base of the skull through the vertex without intravenous contrast. COMPARISON:  02/01/2018 FINDINGS: Brain: No evidence of acute infarction, hemorrhage, hydrocephalus, extra-axial collection or mass lesion/mass effect. Vascular: No hyperdense vessel or unexpected calcification. Skull: Normal. Negative for fracture or focal lesion. Sinuses/Orbits: Globes and orbits are unremarkable. Visualized sinuses and mastoid air cells are clear. Other: None. IMPRESSION: 1. Normal CT scan of the brain for age. No change from the prior study. Electronically Signed   By: Lajean Manes M.D.   On: 01/05/2019 11:28    Pending Labs Unresulted Labs (From admission,  onward)    Start     Ordered   01/06/19 0500  Comprehensive metabolic panel  Tomorrow morning,   STAT     01/05/19 1616   01/06/19 0500  CBC  Tomorrow morning,   STAT     01/05/19 1616   01/05/19 1617  Vitamin B12  Once,   STAT     01/05/19 1616   01/05/19 1233  SARS CORONAVIRUS 2 (TAT 6-24 HRS) Nasopharyngeal Nasopharyngeal Swab  (Tier 3 (TAT 6-24 hrs))  Once,   STAT    Question Answer Comment  Is this test for diagnosis or screening Screening   Symptomatic for COVID-19 as defined by CDC No   Hospitalized for COVID-19 No   Admitted to ICU for COVID-19 No   Previously tested for COVID-19 Yes   Resident in a congregate (group) care setting No   Employed in healthcare setting No      01/05/19 1232          Vitals/Pain Today's Vitals   01/05/19 1600 01/05/19 1617 01/05/19 1645 01/05/19 1813  BP:  (!) 152/83 (!) 152/73 (!) 150/75  Pulse: (!) 56 (!) 57 (!) 56 70  Resp: 17 16 (!) 25 18  Temp:      TempSrc:      SpO2: 100% 99% 99% 97%  Weight:      Height:      PainSc:        Isolation Precautions No active isolations  Medications Medications  sodium chloride flush (NS) 0.9 %  injection 3 mL (3 mLs Intravenous Not Given 01/05/19 1048)  atorvastatin (LIPITOR) tablet 80 mg (has no administration in time range)  ezetimibe (ZETIA) tablet 10 mg (has no administration in time range)  isosorbide mononitrate (IMDUR) 24 hr tablet 60 mg (has no administration in time range)  ranolazine (RANEXA) 12 hr tablet 1,000 mg (has no administration in time range)  rivaroxaban (XARELTO) tablet 20 mg (has no administration in time range)    Mobility walks High fall risk   Focused Assessments n/a   R Recommendations: See Admitting Provider Note  Report given to:   Additional Notes: n/a

## 2019-01-05 NOTE — ED Notes (Signed)
Pt asking for something to eat, pt told he cannot have anything yet per EDP. Pt ok with this response.

## 2019-01-05 NOTE — ED Notes (Signed)
Pt with regular diet order, pt given diet soda and crackers while he waits for tray from dietary. Dietary called for tray. Pt tolerating po well.

## 2019-01-06 DIAGNOSIS — Z8249 Family history of ischemic heart disease and other diseases of the circulatory system: Secondary | ICD-10-CM | POA: Diagnosis not present

## 2019-01-06 DIAGNOSIS — J8489 Other specified interstitial pulmonary diseases: Secondary | ICD-10-CM | POA: Insufficient documentation

## 2019-01-06 DIAGNOSIS — G459 Transient cerebral ischemic attack, unspecified: Secondary | ICD-10-CM | POA: Diagnosis present

## 2019-01-06 DIAGNOSIS — I252 Old myocardial infarction: Secondary | ICD-10-CM | POA: Diagnosis not present

## 2019-01-06 DIAGNOSIS — Z7901 Long term (current) use of anticoagulants: Secondary | ICD-10-CM | POA: Diagnosis not present

## 2019-01-06 DIAGNOSIS — Z961 Presence of intraocular lens: Secondary | ICD-10-CM | POA: Diagnosis present

## 2019-01-06 DIAGNOSIS — D519 Vitamin B12 deficiency anemia, unspecified: Secondary | ICD-10-CM | POA: Diagnosis not present

## 2019-01-06 DIAGNOSIS — R079 Chest pain, unspecified: Secondary | ICD-10-CM | POA: Diagnosis not present

## 2019-01-06 DIAGNOSIS — R42 Dizziness and giddiness: Secondary | ICD-10-CM | POA: Diagnosis present

## 2019-01-06 DIAGNOSIS — E538 Deficiency of other specified B group vitamins: Secondary | ICD-10-CM | POA: Diagnosis present

## 2019-01-06 DIAGNOSIS — Z86711 Personal history of pulmonary embolism: Secondary | ICD-10-CM | POA: Diagnosis not present

## 2019-01-06 DIAGNOSIS — F410 Panic disorder [episodic paroxysmal anxiety] without agoraphobia: Secondary | ICD-10-CM | POA: Diagnosis present

## 2019-01-06 DIAGNOSIS — Z79899 Other long term (current) drug therapy: Secondary | ICD-10-CM | POA: Diagnosis not present

## 2019-01-06 DIAGNOSIS — I5022 Chronic systolic (congestive) heart failure: Secondary | ICD-10-CM | POA: Diagnosis present

## 2019-01-06 DIAGNOSIS — Z20828 Contact with and (suspected) exposure to other viral communicable diseases: Secondary | ICD-10-CM | POA: Diagnosis present

## 2019-01-06 DIAGNOSIS — M1990 Unspecified osteoarthritis, unspecified site: Secondary | ICD-10-CM | POA: Diagnosis present

## 2019-01-06 DIAGNOSIS — Z955 Presence of coronary angioplasty implant and graft: Secondary | ICD-10-CM | POA: Diagnosis not present

## 2019-01-06 DIAGNOSIS — R55 Syncope and collapse: Secondary | ICD-10-CM | POA: Diagnosis not present

## 2019-01-06 DIAGNOSIS — E785 Hyperlipidemia, unspecified: Secondary | ICD-10-CM | POA: Diagnosis present

## 2019-01-06 DIAGNOSIS — K219 Gastro-esophageal reflux disease without esophagitis: Secondary | ICD-10-CM | POA: Diagnosis present

## 2019-01-06 DIAGNOSIS — Z9842 Cataract extraction status, left eye: Secondary | ICD-10-CM | POA: Diagnosis not present

## 2019-01-06 DIAGNOSIS — Z87891 Personal history of nicotine dependence: Secondary | ICD-10-CM | POA: Diagnosis not present

## 2019-01-06 DIAGNOSIS — Z91041 Radiographic dye allergy status: Secondary | ICD-10-CM | POA: Diagnosis not present

## 2019-01-06 DIAGNOSIS — I255 Ischemic cardiomyopathy: Secondary | ICD-10-CM | POA: Diagnosis present

## 2019-01-06 DIAGNOSIS — I110 Hypertensive heart disease with heart failure: Secondary | ICD-10-CM | POA: Diagnosis present

## 2019-01-06 DIAGNOSIS — I480 Paroxysmal atrial fibrillation: Secondary | ICD-10-CM | POA: Diagnosis present

## 2019-01-06 DIAGNOSIS — Z9181 History of falling: Secondary | ICD-10-CM | POA: Diagnosis not present

## 2019-01-06 DIAGNOSIS — R531 Weakness: Secondary | ICD-10-CM | POA: Diagnosis present

## 2019-01-06 DIAGNOSIS — I2510 Atherosclerotic heart disease of native coronary artery without angina pectoris: Secondary | ICD-10-CM | POA: Diagnosis present

## 2019-01-06 LAB — CBC
HCT: 40.2 % (ref 39.0–52.0)
Hemoglobin: 14.2 g/dL (ref 13.0–17.0)
MCH: 32.5 pg (ref 26.0–34.0)
MCHC: 35.3 g/dL (ref 30.0–36.0)
MCV: 92 fL (ref 80.0–100.0)
Platelets: 189 10*3/uL (ref 150–400)
RBC: 4.37 MIL/uL (ref 4.22–5.81)
RDW: 13.5 % (ref 11.5–15.5)
WBC: 5 10*3/uL (ref 4.0–10.5)
nRBC: 0 % (ref 0.0–0.2)

## 2019-01-06 LAB — COMPREHENSIVE METABOLIC PANEL
ALT: 17 U/L (ref 0–44)
AST: 18 U/L (ref 15–41)
Albumin: 3.2 g/dL — ABNORMAL LOW (ref 3.5–5.0)
Alkaline Phosphatase: 33 U/L — ABNORMAL LOW (ref 38–126)
Anion gap: 8 (ref 5–15)
BUN: 20 mg/dL (ref 8–23)
CO2: 24 mmol/L (ref 22–32)
Calcium: 8.5 mg/dL — ABNORMAL LOW (ref 8.9–10.3)
Chloride: 108 mmol/L (ref 98–111)
Creatinine, Ser: 1.14 mg/dL (ref 0.61–1.24)
GFR calc Af Amer: >60 mL/min (ref >60)
GFR calc non Af Amer: >60 mL/min (ref >60)
Glucose, Bld: 96 mg/dL (ref 70–99)
Potassium: 4.1 mmol/L (ref 3.5–5.1)
Sodium: 140 mmol/L (ref 135–145)
Total Bilirubin: 0.9 mg/dL (ref 0.3–1.2)
Total Protein: 6.1 g/dL — ABNORMAL LOW (ref 6.5–8.1)

## 2019-01-06 LAB — TROPONIN I (HIGH SENSITIVITY)
Troponin I (High Sensitivity): 15 ng/L (ref <18)
Troponin I (High Sensitivity): 14 ng/L (ref <18)

## 2019-01-06 LAB — SARS CORONAVIRUS 2 (TAT 6-24 HRS): SARS Coronavirus 2: NEGATIVE

## 2019-01-06 MED ORDER — SIMETHICONE 80 MG PO CHEW
80.0000 mg | CHEWABLE_TABLET | Freq: Four times a day (QID) | ORAL | Status: DC | PRN
  Administered 2019-01-06: 22:00:00 80 mg via ORAL
  Filled 2019-01-06 (×2): qty 1

## 2019-01-06 MED ORDER — CYANOCOBALAMIN 1000 MCG/ML IJ SOLN
1000.0000 ug | Freq: Once | INTRAMUSCULAR | Status: AC
  Administered 2019-01-06: 1000 ug via INTRAMUSCULAR
  Filled 2019-01-06: qty 1

## 2019-01-06 NOTE — Progress Notes (Signed)
eeg completed ° °

## 2019-01-06 NOTE — Progress Notes (Addendum)
PT Cancellation Note  Patient Details Name: Scott [name] MRN: [id number] DOB: 02/11/1940   Cancelled Treatment:    Reason Eval/Treat Not Completed: Medical issues which prohibited therapy(Per care team, patient with near syncopal episode with recent toileting episode.  Now with orders for stat troponin, EKG, EEG.  Will hold at this time and re-attempt at later time/date as medcially appropriate and available.)   Salvador Bigbee H. Owens Shark, PT, DPT, NCS 01/06/19, 10:39 AM [phone]

## 2019-01-06 NOTE — Consult Note (Signed)
Reason for Consult:Lightheadedness Referring Physician: Dhungel  CC: Lightheadedness, chest pain  HPI: Scott [name] is an 78 y.o. male presenting with episodes of lightheadedness and chest pain.  Patient reports he has had episodes of lightheadedness for months that are intermittent and cause him to have to hold onto things at times.  On yesterday was unable to stand after multiple attempts due to the fact that he became so lightheaded on standing.  Patient also had chest pain, visual obscuration and difficulty getting his words out.  Does well as long has he is sitting.  Today was able to get up and go to the bathroom but on return again became lightheaded and had to hold on.   Although patient reports these symptoms have only been present for a few months on review of the record he had these symptoms in 2018 when he was admitted as well.  Work up was unremarkable.  Despite having this long history of "dizzy" spells he continues to do high risk things such as get up on roofs, etc and has had multiple falls.    Patient has also had some starring episodes.    Past Medical History:  Diagnosis Date  . Arthritis    right hip, since a fall  . CAD (coronary artery disease)    a. 04/2012 Cath: 3VD->Med Rx;  b. 04/2013 PCI RCA (2 DES); c. 03/2014 PCI: LAD 80 (3.0x23 Xience [place] DES); d. 06/2014 CABG x 2 ([facility]) LIMA->LAD, VG->OM; e. 12/2014 Cath ([facility]): patent grafts->Med Rx; f. 08/2015 Cath: patent grafts; g. 11/2016 Cath: LM min irregs, LAD 20p, 16m, LCX 100p/m, RCA 20ost, 10p/m/d, VG->OM1 nl, LIMA->dLAD nl; f. 07/2017 MV: No isch, EF 51%.  . Cardiomyopathy, ischemic    a. 04/2012 Echo: EF 40-45%;  b. 08/2013 Echo: EF 45-50%; c. 01/2015 Echo: EF 40-45%; d. 07/2016 Echo: EF 60-65%; e. 09/2017 Echo: EF 55-60%, Gr1 DD.  . Carotid arterial disease ([place])    a. 05/2013 Carotid U/S; bilat 40-50% ICA stenosis.  . Chronic Chest Pain    USES NITRO  . Chronic systolic CHF (congestive heart failure) ([place])    a. 01/2015  Echo: EF 40-45%; b. 07/2016 Echo: EF 60-65%; c. 09/2017 Echo: EF 55-60%, Gr1 DD, nl RV fxn.  . Cough   . Diverticulosis   . Dizziness   . Dyspnea    WITH EXERTION  . Headache 1970's   migraine  . History of blood transfusion 2016   post op  . Hyperlipidemia   . Hypertension   . Iron deficiency anemia due to chronic blood loss 09/11/2016  . Myocardial infarction [facility])    unsure of when  . Pulmonary emboli ([place]) 07/2016   a. On Xarelto.  . Reflux esophagitis   . Sternal pain    a. 03/2016 s/p Sternal wire removal; b. 05/2016 s/p redo median sternotomy for sternal debridement and sternal plating.  . Syncope 04/2018   CARDIOLOGIST WAS DR Rockey Situ AND HE WAS AWARE-NOW PT SEES PARASCHOS  . Tubular adenoma of colon     Past Surgical History:  Procedure Laterality Date  . CARDIAC CATHETERIZATION  05/01/2012  . CARDIAC CATHETERIZATION  04/2013   [facility];x3 stent  . CARDIAC CATHETERIZATION  01/11/15    [facility]  . CARDIAC CATHETERIZATION N/A 09/16/2015   Procedure: LEFT HEART CATH AND CORS/GRAFTS ANGIOGRAPHY;  Surgeon: Minna Merritts, MD;  Location: [place] [facility];  Service: Cardiovascular;  Laterality: N/A;  . CARPAL TUNNEL RELEASE     right hand  .  CATARACT EXTRACTION     LEFT  . CATARACT EXTRACTION W/PHACO Left 09/16/2014   Procedure: CATARACT EXTRACTION PHACO AND INTRAOCULAR LENS PLACEMENT (IOC);  Surgeon: Leandrew Koyanagi, MD;  Location: [place];  Service: Ophthalmology;  Laterality: Left;  . COLONOSCOPY    . COLONOSCOPY WITH PROPOFOL N/A 01/28/2016   Procedure: COLONOSCOPY WITH PROPOFOL;  Surgeon: Lollie Sails, MD;  Location: [facility] ENDOSCOPY;  Service: Endoscopy;  Laterality: N/A;  . CORONARY ANGIOPLASTY WITH STENT PLACEMENT  04/13/2014  . CORONARY ARTERY BYPASS GRAFT  06-26-14   x3 bypasses  . CORONARY PRESSURE WIRE/FFR WITH 3D MAPPING N/A 12/10/2018   Procedure: Suzette Battiest PRESSURE WIRE/FFR STUDY;  Surgeon: Isaias Cowman, MD;  Location: [place] [facility];   Service: Cardiovascular;  Laterality: N/A;  . CYSTOSCOPY N/A 08/14/2018   Procedure: CYSTOSCOPY;  Surgeon: Abbie Sons, MD;  Location: [facility]S;  Service: Urology;  Laterality: N/A;  . DE QUERVAIN'S RELEASE Left 08/22/2012  . ESOPHAGOGASTRODUODENOSCOPY (EGD) WITH PROPOFOL N/A 04/26/2015   Procedure: ESOPHAGOGASTRODUODENOSCOPY (EGD) WITH PROPOFOL;  Surgeon: Hulen Luster, MD;  Location: [facility];  Service: Gastroenterology;  Laterality: N/A;  . ESOPHAGOGASTRODUODENOSCOPY (EGD) WITH PROPOFOL N/A 05/29/2017   Procedure: ESOPHAGOGASTRODUODENOSCOPY (EGD) WITH PROPOFOL;  Surgeon: Lollie Sails, MD;  Location: [facility] ENDOSCOPY;  Service: Endoscopy;  Laterality: N/A;  . INTRAVASCULAR ULTRASOUND/IVUS N/A 12/10/2018   Procedure: Intravascular Ultrasound/IVUS;  Surgeon: Isaias Cowman, MD;  Location: [place] [facility];  Service: Cardiovascular;  Laterality: N/A;  . LEFT HEART CATH AND CORONARY ANGIOGRAPHY N/A 12/04/2016   Procedure: LEFT HEART CATH AND CORS/GRAFTS  ANGIOGRAPHY;  Surgeon: Wellington Hampshire, MD;  Location: [place] [facility];  Service: Cardiovascular;  Laterality: N/A;  . LEFT HEART CATH AND CORS/GRAFTS ANGIOGRAPHY N/A 04/09/2018   Procedure: LEFT HEART CATH AND CORS/GRAFTS ANGIOGRAPHY;  Surgeon: Isaias Cowman, MD;  Location: [place] [facility];  Service: Cardiovascular;  Laterality: N/A;  . LEFT HEART CATH AND CORS/GRAFTS ANGIOGRAPHY N/A 12/10/2018   Procedure: LEFT HEART CATH AND CORS/GRAFTS ANGIOGRAPHY;  Surgeon: Isaias Cowman, MD;  Location: [place] [facility];  Service: Cardiovascular;  Laterality: N/A;  . RIB PLATING N/A 05/25/2016   Procedure: STERNAL PLATING;  Surgeon: Melrose Nakayama, MD;  Location: [place];  Service: Thoracic;  Laterality: N/A;  . right shoulder    . STERNAL WIRES REMOVAL N/A 03/30/2016   Procedure: STERNAL WIRES REMOVAL;  Surgeon: Melrose Nakayama, MD;  Location: [facility] OR;  Service: Thoracic;  Laterality: N/A;  . TONSILLECTOMY       Family History  Problem Relation Age of Onset  . Heart attack Father 8       MI  . Cancer Maternal Uncle     Social History:  reports that he has quit smoking. His smoking use included cigarettes. He started smoking about 9 years ago. He has a 35.00 pack-year smoking history. He has quit using smokeless tobacco. He reports that he does not drink alcohol or use drugs.  Allergies  Allergen Reactions  . Contrast Media [Iodinated Diagnostic Agents] Hives  . Metrizamide Hives  . Oxycodone Hcl Itching  . Vicodin [Hydrocodone-Acetaminophen] Itching    Medications:  I have reviewed the patient's current medications. Prior to Admission:  Facility-Administered Medications Prior to Admission  Medication Dose Route Frequency Provider Last Rate Last Admin  . lidocaine (XYLOCAINE) 2 % jelly 1 application  1 application Urethral Once Stoioff, Ronda Fairly, MD       Medications Prior to Admission  Medication Sig Dispense Refill  Last Dose  . atorvastatin (LIPITOR) 80 MG tablet Take 1 tablet (80 mg total) by mouth daily at 6 PM. 30 tablet 0 01/04/2019 at 1800  . ezetimibe (ZETIA) 10 MG tablet Take 1 tablet (10 mg total) by mouth daily. 90 tablet 3 01/05/2019 at 0730  . ipratropium (ATROVENT) 0.06 % nasal spray Place 2 sprays into both nostrils 3 (three) times daily as needed for rhinitis or allergies.   Unknown at PRN  . isosorbide mononitrate (IMDUR) 60 MG 24 hr tablet Take 60 mg by mouth 2 (two) times daily.    01/05/2019 at 0730  . nitroGLYCERIN (NITROSTAT) 0.4 MG SL tablet Place 1 tablet (0.4 mg total) under the tongue every 5 (five) minutes as needed for chest pain. 25 tablet 6 Unknown at PRN  . ranolazine (RANEXA) 1000 MG SR tablet Take 1 tablet (1,000 mg total) by mouth 2 (two) times daily. 180 tablet 3 01/05/2019 at 0730  . rivaroxaban (XARELTO) 20 MG TABS tablet Take 1 tablet (20 mg total) by mouth daily. 90 tablet 3 01/05/2019 at 0730   Scheduled: . atorvastatin  80 mg Oral q1800  .  ezetimibe  10 mg Oral Daily  . isosorbide mononitrate  60 mg Oral Daily  . ranolazine  1,000 mg Oral BID  . rivaroxaban  20 mg Oral Daily  . sodium chloride flush  3 mL Intravenous Once    ROS: History obtained from the patient  General ROS: negative for - chills, fatigue, fever, night sweats, weight gain or weight loss Psychological ROS: negative for - behavioral disorder, hallucinations, memory difficulties, mood swings or suicidal ideation Ophthalmic ROS: negative for - blurry vision, double vision, eye pain or loss of vision ENT ROS: vertigo Allergy and Immunology ROS: negative for - hives or itchy/watery eyes Hematological and Lymphatic ROS: negative for - bleeding problems, bruising or swollen lymph nodes Endocrine ROS: negative for - galactorrhea, hair pattern changes, polydipsia/polyuria or temperature intolerance Respiratory ROS: negative for - cough, hemoptysis, shortness of breath or wheezing Cardiovascular ROS: chest pain Gastrointestinal ROS: negative for - abdominal pain, diarrhea, hematemesis, nausea/vomiting or stool incontinence Genito-Urinary ROS: negative for - dysuria, hematuria, incontinence or urinary frequency/urgency Musculoskeletal ROS: negative for - joint swelling or muscular weakness Neurological ROS: as noted in HPI Dermatological ROS: negative for rash and skin lesion changes  Physical Examination: Blood pressure 111/69, pulse (!) 58, temperature 97.9 F (36.6 C), temperature source Oral, resp. rate 18, height 5\' 10"  (1.778 m), weight 80.2 kg, SpO2 99 %.  HEENT-  Normocephalic, no lesions, without obvious abnormality.  Normal external eye and conjunctiva.  Normal TM's bilaterally.  Normal auditory canals and external ears. Normal external nose, mucus membranes and septum.  Normal pharynx. Cardiovascular- S1, S2 normal, pulses palpable throughout   Lungs- chest clear, no wheezing, rales, normal symmetric air entry Abdomen- soft, non-tender; bowel sounds  normal; no masses,  no organomegaly Extremities- no edema Lymph-no adenopathy palpable Musculoskeletal-no joint tenderness, deformity or swelling Skin-warm and dry, no hyperpigmentation, vitiligo, or suspicious lesions  Neurological Examination   Mental Status: Alert, oriented, thought content appropriate.  Speech fluent without evidence of aphasia.  Able to follow 3 step commands without difficulty. Cranial Nerves: II: Discs flat bilaterally; Visual fields grossly normal, pupils equal, round, reactive to light and accommodation III,IV, VI: ptosis not present, extra-ocular motions intact bilaterally V,VII: smile symmetric, facial light touch sensation normal bilaterally VIII: hearing normal bilaterally IX,X: gag reflex present XI: bilateral shoulder shrug XII: midline tongue extension Motor: Right :  Upper extremity   5/5    Left:     Upper extremity   5/5  Lower extremity   5/5     Lower extremity   5/5 Tone and bulk:normal tone throughout; no atrophy noted Sensory: Pinprick and light touch intact throughout, bilaterally Deep Tendon Reflexes: Symmetric throughout Plantars: Right: mute   Left: mute Cerebellar: Normal finger-to-nose and normal heel-to-shin testing bilaterally Gait: not tested due to safety concerns    Laboratory Studies:   Basic Metabolic Panel: Recent Labs  Lab 01/05/19 1236 01/06/19 0510  NA 138 140  K 4.2 4.1  CL 105 108  CO2 23 24  GLUCOSE 96 96  BUN 22 20  CREATININE 1.20 1.14  CALCIUM 8.7* 8.5*    Liver Function Tests: Recent Labs  Lab 01/06/19 0510  AST 18  ALT 17  ALKPHOS 33*  BILITOT 0.9  PROT 6.1*  ALBUMIN 3.2*   No results for input(s): LIPASE, AMYLASE in the last 168 hours. No results for input(s): AMMONIA in the last 168 hours.  CBC: Recent Labs  Lab 01/05/19 1031 01/06/19 0510  WBC 4.9 5.0  HGB 15.1 14.2  HCT 43.1 40.2  MCV 91.7 92.0  PLT 203 189    Cardiac Enzymes: No results for input(s): CKTOTAL, CKMB, CKMBINDEX,  TROPONINI in the last 168 hours.  BNP: Invalid input(s): POCBNP  CBG: No results for input(s): GLUCAP in the last 168 hours.  Microbiology: Results for orders placed or performed during the hospital encounter of 01/05/19  SARS CORONAVIRUS 2 (TAT 6-24 HRS) Nasopharyngeal Nasopharyngeal Swab     Status: None   Collection Time: 01/05/19 12:36 PM   Specimen: Nasopharyngeal Swab  Result Value Ref Range Status   SARS Coronavirus 2 NEGATIVE NEGATIVE Final    Comment: (NOTE) SARS-CoV-2 target nucleic acids are NOT DETECTED. The SARS-CoV-2 RNA is generally detectable in upper and lower respiratory specimens during the acute phase of infection. Negative results do not preclude SARS-CoV-2 infection, do not rule out co-infections with other pathogens, and should not be used as the sole basis for treatment or other patient management decisions. Negative results must be combined with clinical observations, patient history, and epidemiological information. The expected result is Negative. Fact Sheet for Patients: SugarRoll.be Fact Sheet for Healthcare Providers: https://www.woods-mathews.com/ This test is not yet approved or cleared by the [place] FDA and  has been authorized for detection and/or diagnosis of SARS-CoV-2 by FDA under an Emergency Use Authorization (EUA). This EUA will remain  in effect (meaning this test can be used) for the duration of the COVID-19 declaration under Section 56 4(b)(1) of the Act, 21 U.S.C. section 360bbb-3(b)(1), unless the authorization is terminated or revoked sooner. Performed at [facility], [place] [address] [place], [place] [address]     Coagulation Studies: No results for input(s): LABPROT, INR in the last 72 hours.  Urinalysis: No results for input(s): COLORURINE, LABSPEC, PHURINE, GLUCOSEU, HGBUR, BILIRUBINUR, KETONESUR, PROTEINUR, UROBILINOGEN, NITRITE, LEUKOCYTESUR in the last 168  hours.  Invalid input(s): APPERANCEUR  Lipid Panel:     Component Value Date/Time   CHOL 128 10/03/2017 0352   CHOL 111 06/12/2016 0954   CHOL 122 12/15/2013 0748   TRIG 70 10/03/2017 0352   TRIG 76 12/15/2013 0748   HDL 46 10/03/2017 0352   HDL 41 06/12/2016 0954   HDL 51 12/15/2013 0748   CHOLHDL 2.8 10/03/2017 0352   VLDL 14 10/03/2017 0352   VLDL 15 12/15/2013 0748   LDLCALC 68 10/03/2017  0352   LDLCALC 53 06/12/2016 0954   LDLCALC 56 12/15/2013 0748    HgbA1C:  Lab Results  Component Value Date   HGBA1C 5.9 (H) 11/07/2016    Urine Drug Screen:  No results found for: LABOPIA, COCAINSCRNUR, LABBENZ, AMPHETMU, THCU, LABBARB  Alcohol Level: No results for input(s): ETH in the last 168 hours.  Other results: EKG: sinus rhythm at 62 bpm.  Imaging: DG Chest 2 View  Result Date: 01/05/2019 CLINICAL DATA:  Chest pain that started last night, weakness this morning. Pt states that he is currently having pain in his central chest, pt gets "shocks" of pain into his chest as well. Hx of CAD, cardiomyopathy, CHF, HTN, MI, pulmonary emboli, cardiac cath, CABG, coronary pressure wire, sternal plating- 2018. Former smoker. EXAM: CHEST - 2 VIEW COMPARISON:  11/09/2017. FINDINGS: Stable changes from previous CABG surgery. Cardiac silhouette is normal in size and configuration. No mediastinal or hilar masses. No evidence of adenopathy. Mild prominence of the interstitial markings at the bases. Lungs otherwise clear. No pleural effusion or pneumothorax. Skeletal structures are intact. IMPRESSION: No acute cardiopulmonary disease. Electronically Signed   By: Lajean Manes M.D.   On: 01/05/2019 11:38   CT Head Wo Contrast  Result Date: 01/05/2019 CLINICAL DATA:  Pt to ED via [facility] from home for chest pain that started last night. Pt had to use 3 of his Nitro last night. Pt states that he pain went away and he was able to go to bed. This morning he states that he got up and went out to get  the paper. Pt was reading the paper and felt ok, when he got up the room was spinning. Pt states that he was very weak and felt off balance. Pt appears to be having some trouble getting this thoughts out. EXAM: CT HEAD WITHOUT CONTRAST TECHNIQUE: Contiguous axial images were obtained from the base of the skull through the vertex without intravenous contrast. COMPARISON:  02/01/2018 FINDINGS: Brain: No evidence of acute infarction, hemorrhage, hydrocephalus, extra-axial collection or mass lesion/mass effect. Vascular: No hyperdense vessel or unexpected calcification. Skull: Normal. Negative for fracture or focal lesion. Sinuses/Orbits: Globes and orbits are unremarkable. Visualized sinuses and mastoid air cells are clear. Other: None. IMPRESSION: 1. Normal CT scan of the brain for age. No change from the prior study. Electronically Signed   By: Lajean Manes M.D.   On: 01/05/2019 11:28   MR BRAIN WO CONTRAST  Result Date: 01/05/2019 CLINICAL DATA:  Neuro deficit.  Rule out stroke. EXAM: MRI HEAD WITHOUT CONTRAST TECHNIQUE: Multiplanar, multiecho pulse sequences of the brain and surrounding structures were obtained without intravenous contrast. COMPARISON:  MRI head 11/20/2016 FINDINGS: Brain: Negative for acute infarct. Few small white matter hyperintensities bilaterally unchanged from the prior study. Chronic microhemorrhage left cerebellum. Negative for mass or edema. Vascular: Normal arterial flow voids. Hypoplastic distal right vertebral artery unchanged. Skull and upper cervical spine: No focal skeletal lesion. Sinuses/Orbits: Mild mucosal edema paranasal sinuses. Bilateral cataract surgery Other: None IMPRESSION: Negative for acute infarct. 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 Electronically Signed   By: Franchot Gallo M.D.   On: 01/05/2019 15:56     Assessment/Plan: 78 year old male with recurrent, intermittent episodes of lightheadedness, yesterday to the point that he  could not stand.  Improved today.  Neurological examination is nonfocal.  MRI of the brain reviewed and shows no acute changes.  Patient also with chest pain and starring episodes.  Cardiology following for  chest pain.  Patient not orthostatic.  TSH, B12 normal.  Do not suspect these events represent TIA.  Patient on statins and Xarelto.  With history of multiple falls and PN, may very well be multifactorial in etiology.    Recommendations: 1. ENT evaluation 2. EEG 3. Patient to refrain from activities performed at heights   4. Continue Xarelto  Alexis Goodell, MD Neurology [phone] 01/06/2019, 3:54 PM

## 2019-01-06 NOTE — Consult Note (Signed)
[facility]  CARDIOLOGY CONSULT NOTE  Patient ID: Scott [name] MRN: [id number] DOB/AGE: November 18, 1940 78 y.o.  Admit date: 01/05/2019 Referring Physician Marzetta Board MD Primary Physician Dr Meade Maw primary Primary Cardiologist Dr Saralyn Pilar Reason for Consultation chest pain weakness  HPI: Patient 78 year old white male known coronary disease known coronary bypass surgery chronic systolic congestive heart failure hypertension lipidemia previous PE on chronic Xarelto for anticoagulation most recently had cardiac evaluation about a month ago including cardiac cath with flow wire which showed no physiologic significant obstructive disease even though he had severe disease in the circumflex.  Patient was treated medically and has done reasonably well over the last few days it developed generalized weakness with vertigo lightheadedness.  He has had the same sort of chest pain is chronic been maintained on Imdur and Ranexa.  Denies previous syncope denies any seizure activity with the worsening symptoms he was brought to the emergency room and admitted the patient also had a separate episode this morning while in the hospital of just generalized weakness not able to stand and falling back.. Denies any significant fever chills or sweats no nausea vomiting or diarrhea  Review of systems complete and found to be negative unless listed above     Past Medical History:  Diagnosis Date  . Arthritis    right hip, since a fall  . CAD (coronary artery disease)    a. 04/2012 Cath: 3VD->Med Rx;  b. 04/2013 PCI RCA (2 DES); c. 03/2014 PCI: LAD 80 (3.0x23 Xience Alpine DES); d. 06/2014 CABG x 2 ([facility]) LIMA->LAD, VG->OM; e. 12/2014 Cath ([facility]): patent grafts->Med Rx; f. 08/2015 Cath: patent grafts; g. 11/2016 Cath: LM min irregs, LAD 20p, 33m, LCX 100p/m, RCA 20ost, 10p/m/d, VG->OM1 nl, LIMA->dLAD nl; f. 07/2017 MV: No isch, EF 51%.  . Cardiomyopathy, ischemic    a. 04/2012 Echo: EF 40-45%;  b. 08/2013 Echo:  EF 45-50%; c. 01/2015 Echo: EF 40-45%; d. 07/2016 Echo: EF 60-65%; e. 09/2017 Echo: EF 55-60%, Gr1 DD.  . Carotid arterial disease ([place])    a. 05/2013 Carotid U/S; bilat 40-50% ICA stenosis.  . Chronic Chest Pain    USES NITRO  . Chronic systolic CHF (congestive heart failure) ([place])    a. 01/2015 Echo: EF 40-45%; b. 07/2016 Echo: EF 60-65%; c. 09/2017 Echo: EF 55-60%, Gr1 DD, nl RV fxn.  . Cough   . Diverticulosis   . Dizziness   . Dyspnea    WITH EXERTION  . Headache 1970's   migraine  . History of blood transfusion 2016   post op  . Hyperlipidemia   . Hypertension   . Iron deficiency anemia due to chronic blood loss 09/11/2016  . Myocardial infarction [facility])    unsure of when  . Pulmonary emboli ([place]) 07/2016   a. On Xarelto.  . Reflux esophagitis   . Sternal pain    a. 03/2016 s/p Sternal wire removal; b. 05/2016 s/p redo median sternotomy for sternal debridement and sternal plating.  . Syncope 04/2018   CARDIOLOGIST WAS DR Rockey Situ AND HE WAS AWARE-NOW PT SEES PARASCHOS  . Tubular adenoma of colon     Past Surgical History:  Procedure Laterality Date  . CARDIAC CATHETERIZATION  05/01/2012  . CARDIAC CATHETERIZATION  04/2013   [facility];x3 stent  . CARDIAC CATHETERIZATION  01/11/15    [facility]  . CARDIAC CATHETERIZATION N/A 09/16/2015   Procedure: LEFT HEART CATH AND CORS/GRAFTS ANGIOGRAPHY;  Surgeon: Minna Merritts, MD;  Location: [place] [facility];  Service: Cardiovascular;  Laterality: N/A;  . CARPAL TUNNEL RELEASE     right hand  . CATARACT EXTRACTION     LEFT  . CATARACT EXTRACTION W/PHACO Left 09/16/2014   Procedure: CATARACT EXTRACTION PHACO AND INTRAOCULAR LENS PLACEMENT (IOC);  Surgeon: Leandrew Koyanagi, MD;  Location: [place];  Service: Ophthalmology;  Laterality: Left;  . COLONOSCOPY    . COLONOSCOPY WITH PROPOFOL N/A 01/28/2016   Procedure: COLONOSCOPY WITH PROPOFOL;  Surgeon: Lollie Sails, MD;  Location: [facility] ENDOSCOPY;  Service: Endoscopy;  Laterality: N/A;   . CORONARY ANGIOPLASTY WITH STENT PLACEMENT  04/13/2014  . CORONARY ARTERY BYPASS GRAFT  06-26-14   x3 bypasses  . CORONARY PRESSURE WIRE/FFR WITH 3D MAPPING N/A 12/10/2018   Procedure: Suzette Battiest PRESSURE WIRE/FFR STUDY;  Surgeon: Isaias Cowman, MD;  Location: [place] [facility];  Service: Cardiovascular;  Laterality: N/A;  . CYSTOSCOPY N/A 08/14/2018   Procedure: CYSTOSCOPY;  Surgeon: Abbie Sons, MD;  Location: [facility];  Service: Urology;  Laterality: N/A;  . DE QUERVAIN'S RELEASE Left 08/22/2012  . ESOPHAGOGASTRODUODENOSCOPY (EGD) WITH PROPOFOL N/A 04/26/2015   Procedure: ESOPHAGOGASTRODUODENOSCOPY (EGD) WITH PROPOFOL;  Surgeon: Hulen Luster, MD;  Location: [facility] ENDOSCOPY;  Service: Gastroenterology;  Laterality: N/A;  . ESOPHAGOGASTRODUODENOSCOPY (EGD) WITH PROPOFOL N/A 05/29/2017   Procedure: ESOPHAGOGASTRODUODENOSCOPY (EGD) WITH PROPOFOL;  Surgeon: Lollie Sails, MD;  Location: [facility] ENDOSCOPY;  Service: Endoscopy;  Laterality: N/A;  . INTRAVASCULAR ULTRASOUND/IVUS N/A 12/10/2018   Procedure: Intravascular Ultrasound/IVUS;  Surgeon: Isaias Cowman, MD;  Location: [place] [facility];  Service: Cardiovascular;  Laterality: N/A;  . LEFT HEART CATH AND CORONARY ANGIOGRAPHY N/A 12/04/2016   Procedure: LEFT HEART CATH AND CORS/GRAFTS  ANGIOGRAPHY;  Surgeon: Wellington Hampshire, MD;  Location: [place] [facility];  Service: Cardiovascular;  Laterality: N/A;  . LEFT HEART CATH AND CORS/GRAFTS ANGIOGRAPHY N/A 04/09/2018   Procedure: LEFT HEART CATH AND CORS/GRAFTS ANGIOGRAPHY;  Surgeon: Isaias Cowman, MD;  Location: [place] [facility];  Service: Cardiovascular;  Laterality: N/A;  . LEFT HEART CATH AND CORS/GRAFTS ANGIOGRAPHY N/A 12/10/2018   Procedure: LEFT HEART CATH AND CORS/GRAFTS ANGIOGRAPHY;  Surgeon: Isaias Cowman, MD;  Location: [place] [facility];  Service: Cardiovascular;  Laterality: N/A;  . RIB PLATING N/A 05/25/2016   Procedure: STERNAL PLATING;   Surgeon: Melrose Nakayama, MD;  Location: [place];  Service: Thoracic;  Laterality: N/A;  . right shoulder    . STERNAL WIRES REMOVAL N/A 03/30/2016   Procedure: STERNAL WIRES REMOVAL;  Surgeon: Melrose Nakayama, MD;  Location: [place];  Service: Thoracic;  Laterality: N/A;  . TONSILLECTOMY      Facility-Administered Medications Prior to Admission  Medication Dose Route Frequency Provider Last Rate Last Admin  . lidocaine (XYLOCAINE) 2 % jelly 1 application  1 application Urethral Once Stoioff, Ronda Fairly, MD       Medications Prior to Admission  Medication Sig Dispense Refill Last Dose  . atorvastatin (LIPITOR) 80 MG tablet Take 1 tablet (80 mg total) by mouth daily at 6 PM. 30 tablet 0 01/04/2019 at 1800  . ezetimibe (ZETIA) 10 MG tablet Take 1 tablet (10 mg total) by mouth daily. 90 tablet 3 01/05/2019 at 0730  . ipratropium (ATROVENT) 0.06 % nasal spray Place 2 sprays into both nostrils 3 (three) times daily as needed for rhinitis or allergies.   Unknown at PRN  . isosorbide mononitrate (IMDUR) 60 MG 24 hr tablet Take 60 mg by mouth 2 (two) times daily.  01/05/2019 at 0730  . nitroGLYCERIN (NITROSTAT) 0.4 MG SL tablet Place 1 tablet (0.4 mg total) under the tongue every 5 (five) minutes as needed for chest pain. 25 tablet 6 Unknown at PRN  . ranolazine (RANEXA) 1000 MG SR tablet Take 1 tablet (1,000 mg total) by mouth 2 (two) times daily. 180 tablet 3 01/05/2019 at 0730  . rivaroxaban (XARELTO) 20 MG TABS tablet Take 1 tablet (20 mg total) by mouth daily. 90 tablet 3 01/05/2019 at 0730   Social History   Socioeconomic History  . Marital status: Married    Spouse name: Not on file  . Number of children: Not on file  . Years of education: Not on file  . Highest education level: Not on file  Occupational History  . Not on file  Tobacco Use  . Smoking status: Former Smoker    Packs/day: 1.00    Years: 35.00    Pack years: 35.00    Types: Cigarettes    Start date: 05/02/2009  .  Smokeless tobacco: Former Network engineer and Sexual Activity  . Alcohol use: No  . Drug use: No  . Sexual activity: Yes  Other Topics Concern  . Not on file  Social History Narrative  . Not on file   Social Determinants of Health   Financial Resource Strain:   . Difficulty of Paying Living Expenses: Not on file  Food Insecurity:   . Worried About Charity fundraiser in the Last Year: Not on file  . Ran Out of Food in the Last Year: Not on file  Transportation Needs:   . Lack of Transportation (Medical): Not on file  . Lack of Transportation (Non-Medical): Not on file  Physical Activity:   . Days of Exercise per Week: Not on file  . Minutes of Exercise per Session: Not on file  Stress:   . Feeling of Stress : Not on file  Social Connections:   . Frequency of Communication with Friends and Family: Not on file  . Frequency of Social Gatherings with Friends and Family: Not on file  . Attends Religious Services: Not on file  . Active Member of Clubs or Organizations: Not on file  . Attends Archivist Meetings: Not on file  . Marital Status: Not on file  Intimate Partner Violence:   . Fear of Current or Ex-Partner: Not on file  . Emotionally Abused: Not on file  . Physically Abused: Not on file  . Sexually Abused: Not on file    Family History  Problem Relation Age of Onset  . Heart attack Father 21       MI  . Cancer Maternal Uncle       Review of systems complete and found to be negative unless listed above      PHYSICAL EXAM  General: Well developed, well nourished, in no acute distress HEENT:  Normocephalic and atramatic Neck:  No JVD.  Lungs: Clear bilaterally to auscultation and percussion. Heart: HRRR . Normal S1 and S2 without gallops or murmurs.  Abdomen: Bowel sounds are positive, abdomen soft and non-tender  Msk:  Back normal, normal gait. Normal strength and tone for age. Extremities: No clubbing, cyanosis or edema.   Neuro: Alert and  oriented X 3. Psych:  Good affect, responds appropriately  Labs:   Lab Results  Component Value Date   WBC 5.0 01/06/2019   HGB 14.2 01/06/2019   HCT 40.2 01/06/2019   MCV 92.0 01/06/2019   PLT  189 01/06/2019    Recent Labs  Lab 01/06/19 0510  NA 140  K 4.1  CL 108  CO2 24  BUN 20  CREATININE 1.14  CALCIUM 8.5*  PROT 6.1*  BILITOT 0.9  ALKPHOS 33*  ALT 17  AST 18  GLUCOSE 96   Lab Results  Component Value Date   CKTOTAL 116 08/02/2017   CKMB 1.2 03/01/2014   TROPONINI <0.03 11/09/2017    Lab Results  Component Value Date   CHOL 128 10/03/2017   CHOL 103 11/07/2016   CHOL 111 06/12/2016   Lab Results  Component Value Date   HDL 46 10/03/2017   HDL 41 11/07/2016   HDL 41 06/12/2016   Lab Results  Component Value Date   LDLCALC 68 10/03/2017   LDLCALC 56 11/07/2016   LDLCALC 53 06/12/2016   Lab Results  Component Value Date   TRIG 70 10/03/2017   TRIG 31 11/07/2016   TRIG 84 06/12/2016   Lab Results  Component Value Date   CHOLHDL 2.8 10/03/2017   CHOLHDL 2.5 11/07/2016   CHOLHDL 2.7 06/12/2016   No results found for: LDLDIRECT    Radiology: DG Chest 2 View  Result Date: 01/05/2019 CLINICAL DATA:  Chest pain that started last night, weakness this morning. Pt states that he is currently having pain in his central chest, pt gets "shocks" of pain into his chest as well. Hx of CAD, cardiomyopathy, CHF, HTN, MI, pulmonary emboli, cardiac cath, CABG, coronary pressure wire, sternal plating- 2018. Former smoker. EXAM: CHEST - 2 VIEW COMPARISON:  11/09/2017. FINDINGS: Stable changes from previous CABG surgery. Cardiac silhouette is normal in size and configuration. No mediastinal or hilar masses. No evidence of adenopathy. Mild prominence of the interstitial markings at the bases. Lungs otherwise clear. No pleural effusion or pneumothorax. Skeletal structures are intact. IMPRESSION: No acute cardiopulmonary disease. Electronically Signed   By: Lajean Manes  M.D.   On: 01/05/2019 11:38   CT Head Wo Contrast  Result Date: 01/05/2019 CLINICAL DATA:  Pt to ED via [facility] from home for chest pain that started last night. Pt had to use 3 of his Nitro last night. Pt states that he pain went away and he was able to go to bed. This morning he states that he got up and went out to get the paper. Pt was reading the paper and felt ok, when he got up the room was spinning. Pt states that he was very weak and felt off balance. Pt appears to be having some trouble getting this thoughts out. EXAM: CT HEAD WITHOUT CONTRAST TECHNIQUE: Contiguous axial images were obtained from the base of the skull through the vertex without intravenous contrast. COMPARISON:  02/01/2018 FINDINGS: Brain: No evidence of acute infarction, hemorrhage, hydrocephalus, extra-axial collection or mass lesion/mass effect. Vascular: No hyperdense vessel or unexpected calcification. Skull: Normal. Negative for fracture or focal lesion. Sinuses/Orbits: Globes and orbits are unremarkable. Visualized sinuses and mastoid air cells are clear. Other: None. IMPRESSION: 1. Normal CT scan of the brain for age. No change from the prior study. Electronically Signed   By: Lajean Manes M.D.   On: 01/05/2019 11:28   MR BRAIN WO CONTRAST  Result Date: 01/05/2019 CLINICAL DATA:  Neuro deficit.  Rule out stroke. EXAM: MRI HEAD WITHOUT CONTRAST TECHNIQUE: Multiplanar, multiecho pulse sequences of the brain and surrounding structures were obtained without intravenous contrast. COMPARISON:  MRI head 11/20/2016 FINDINGS: Brain: Negative for acute infarct. Few small white matter hyperintensities bilaterally unchanged  from the prior study. Chronic microhemorrhage left cerebellum. Negative for mass or edema. Vascular: Normal arterial flow voids. Hypoplastic distal right vertebral artery unchanged. Skull and upper cervical spine: No focal skeletal lesion. Sinuses/Orbits: Mild mucosal edema paranasal sinuses. Bilateral cataract  surgery Other: None IMPRESSION: Negative for acute infarct. 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 Electronically Signed   By: Franchot Gallo M.D.   On: 01/05/2019 15:56   CARDIAC CATHETERIZATION  Result Date: 12/10/2018  Ost LM to Mid LM lesion is 40% stenosed.  Ost Cx to Prox Cx lesion is 90% stenosed.  2nd Mrg lesion is 100% stenosed.  Prox LAD lesion is 20% stenosed.  Previously placed Ost RCA to Mid RCA stent (unknown type) is widely patent.  Previously placed Mid RCA stent (unknown type) is widely patent.  1.  One-vessel coronary artery disease with 95% stenosis ostium small caliber left circumflex with occluded OM 2 which appears unchanged compared to prior angiogram 2.  40% stenosis left main, with negative IFR 3.  Patent LIMA to distal LAD, with competitive flow via native vessel 4.  Patent SVG to ramus intermedius branch 5.  Mildly reduced left ventricular function, with estimated LVEF 40 to 45% Recommendations 1.  Continue medical therapy    EKG: Normal sinus rhythm nonspecific ST-T wave changes  ASSESSMENT AND PLAN:  Generalized weakness Vertigo Atypical chest pain Coronary artery disease History of coronary bypass surgery Hyperlipidemia Near syncope. . Plan Agree with admit follow-up cardiac enzymes and EKGs Consider neurology evaluation for generalized weakness Doubt a significant cardiac component to his symptoms Continue current medical therapy for coronary disease and chest pain Agree with maintenance of hyperlipidemia medications Zetia and Lipitor Maintain Imdur and Ranexa for chronic angina changes A significant cardiac component to his symptoms is very doubtful Recommend conservative medical therapy  Signed: Yolonda Kida MD 01/06/2019, 1:52 PM

## 2019-01-06 NOTE — Progress Notes (Signed)
OT Cancellation Note  Patient Details Name: Scott [name] MRN: [id number] DOB: [birth year]   Cancelled Treatment:    Reason Eval/Treat Not Completed: Patient at procedure or test/ unavailable Order received for OT evaluation and chart reviewed.  Pt had just gone back into bed after attempting to ambulate with CNA to bathroom and did well until he had to return and felt chest pain again and needed assist to get back to bed.  EKG ordered due to this which was about to be completed upon OTs arrival.  Will attempt to see patient again later today after update with EKG results.  PT notified as well.  Chrys Racer, OTR/L, NTMTC ascom [phone] 01/06/19, 10:38 AM

## 2019-01-06 NOTE — Progress Notes (Addendum)
PROGRESS NOTE                                                                                                                                                                                                             Patient Demographics:    Scott [name], is a 78 y.o. male, DOB - 06-08-1940, [id number]  Admit date - 01/05/2019   Admitting Physician Costin Karlyne Greenspan, MD  Outpatient Primary MD for the patient is Baxter Hire, MD  LOS - 0  Outpatient Specialists: [facility] clinic cardiology  Chief Complaint  Patient presents with  . 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  He reports that he sharp substernal chest pain which subsided on its own.  He was mildly dizzy but on the morning of admission he was unable to stand from the chair on 2 occasions.  He informs that he was having difficulty getting his thoughts together which is new.  No focal deficit, tingling or numbness.  Per his wife he has had several months of spells where he stares out into space for a few seconds and does not interact with people around. In the ED vitals were stable.  High-sensitivity troponin was negative.  COVID-19 negative.  Head CT without acute findings.  MRI brain done was negative for acute findings.  Placed on observation.   Subjective:   Patient complained of being dizzy.  Off-and-on chest pain.  Became dizzy with near syncopal episode this morning while working with PT.   Assessment  & Plan :   Principal problem Chest pain with history of CAD Troponin negative.  Complain of off-and-on chest pain.  Recent cardiac cath with 95% stenosis of the ostium and 40% stenosis of the left main.  EF of 40-45%.   Recommended for medical management. Continue Ranexa twice daily.  EKG unremarkable and troponins flat.  Patient reports he is not on blood pressure medications due to dizziness and drops in blood pressure. Cardiology consulted for evaluation.  Near syncope and? TIA CT head and MRI brain negative for acute finding.  Gets periodic carotid ultrasound with his cardiologist with nonsignificant stenosis. Continue statin. Orthostasis  negative. Patient does have low B12 (184).  Started supplement.  TSH normal. EEG ordered to rule out for acute seizures. No history of alcohol use, illicit drug use. Neurology consulted for further evaluation.  History of PE Continue Xarelto  B12 deficiency Added supplement.  Patient status: Inpatient Patient having persistent chest pain symptoms and near syncope with amaurosis fugax-like symptoms concerning for TIA versus postictal phenomenon.  Patient is at high risk of further deteriorating of his neurological function, ongoing dizziness with fall risk and needs cardiac and neurological evaluation.  Patient is unsafe to be discharged home at this time.  He needs to be monitored in an inpatient setting for at least >2 midnight.  Code Status : Full code  Family Communication  : None  Disposition Plan  : Pending clinical improvement, cardiology and neurology evaluation.  Barriers For Discharge : Active symptoms  Consults  : Cardiology, neurology  Procedures  : CT head, MRI brain, EEG  DVT Prophylaxis  : Xarelto  Lab Results  Component Value Date   PLT 189 01/06/2019    Antibiotics  :   Anti-infectives (From admission, onward)   None        Objective:   Vitals:   01/05/19 1956 01/06/19 0544 01/06/19 0750 01/06/19 1001  BP: 131/65 127/75 124/68 (!) 156/73  Pulse: 62 (!) 59 60 67  Resp: 16 16 18    Temp: 97.9 F (36.6 C) 98.4 F (36.9 C) 98.5 F (36.9 C)   TempSrc: Oral Oral Oral   SpO2: 100% 100% 100%   Weight:      Height:         Wt Readings from Last 3 Encounters:  01/05/19 80.2 kg  12/13/18 79.8 kg  12/10/18 79.8 kg     Intake/Output Summary (Last 24 hours) at 01/06/2019 1250 Last data filed at 01/06/2019 1006 Gross per 24 hour  Intake 360 ml  Output --  Net 360 ml     Physical Exam  Gen: Anxious, HEENT:  moist mucosa, supple neck Chest: clear b/l, no added sounds CVS: N S1&S2, no murmurs, rubs or gallop GI: soft, NT, ND, BS+ Musculoskeletal: warm, no edema     Data Review:    CBC Recent Labs  Lab 01/05/19 1031 01/06/19 0510  WBC 4.9 5.0  HGB 15.1 14.2  HCT 43.1 40.2  PLT 203 189  MCV 91.7 92.0  MCH 32.1 32.5  MCHC 35.0 35.3  RDW 13.4 13.5    Chemistries  Recent Labs  Lab 01/05/19 1236 01/06/19 0510  NA 138 140  K 4.2 4.1  CL 105 108  CO2 23 24  GLUCOSE 96 96  BUN 22 20  CREATININE 1.20 1.14  CALCIUM 8.7* 8.5*  AST  --  18  ALT  --  17  ALKPHOS  --  33*  BILITOT  --  0.9   ------------------------------------------------------------------------------------------------------------------ No results for input(s): CHOL, HDL, LDLCALC, TRIG, CHOLHDL, LDLDIRECT in the last 72 hours.  Lab Results  Component Value Date   HGBA1C 5.9 (H) 11/07/2016   ------------------------------------------------------------------------------------------------------------------ Recent Labs    01/05/19 1732  TSH 1.799   ------------------------------------------------------------------------------------------------------------------ Recent Labs    01/05/19 1732  VITAMINB12 182    Coagulation profile No results for input(s): INR, PROTIME in the last 168 hours.  No results for input(s): DDIMER in the last 72 hours.  Cardiac Enzymes No results for input(s): CKMB, TROPONINI, MYOGLOBIN in the last 168 hours.  Invalid input(s): CK ------------------------------------------------------------------------------------------------------------------    Component Value Date/Time  BNP 28.0 01/07/2016 1249    Inpatient Medications  Scheduled Meds: . atorvastatin  80 mg Oral q1800  . ezetimibe  10 mg Oral Daily  . isosorbide mononitrate  60 mg Oral Daily  . ranolazine  1,000 mg Oral BID  . rivaroxaban  20 mg Oral Daily  . sodium chloride flush  3 mL Intravenous Once   Continuous Infusions: PRN Meds:.  Micro Results Recent Results (from the past 240 hour(s))  SARS CORONAVIRUS 2 (TAT 6-24 HRS) Nasopharyngeal Nasopharyngeal Swab     Status: None   Collection Time: 01/05/19 12:36 PM   Specimen: Nasopharyngeal Swab  Result Value Ref Range Status   SARS Coronavirus 2 NEGATIVE NEGATIVE Final    Comment: (NOTE) SARS-CoV-2 target nucleic acids are NOT DETECTED. The SARS-CoV-2 RNA is generally detectable in upper and lower respiratory specimens during the acute phase of infection. Negative results do not preclude SARS-CoV-2 infection, do not rule out co-infections with other pathogens, and should not be used as the sole basis for treatment or other patient management decisions. Negative results must be combined with clinical observations, patient history, and epidemiological information. The expected result is Negative. Fact Sheet for Patients: SugarRoll.be Fact Sheet for Healthcare Providers: https://www.woods-mathews.com/ This test is not yet approved or cleared by the [place] FDA and  has been authorized for detection and/or diagnosis of SARS-CoV-2 by FDA under an Emergency Use Authorization (EUA). This EUA will remain  in effect (meaning this test can be used) for the duration of the COVID-19 declaration under Section 56 4(b)(1) of the Act, 21 U.S.C. section 360bbb-3(b)(1), unless the authorization is terminated or revoked sooner. Performed at [facility], [place] [address] [place], [place] [address]     Radiology Reports DG Chest 2 View  Result Date: 01/05/2019 CLINICAL DATA:  Chest pain that  started last night, weakness this morning. Pt states that he is currently having pain in his central chest, pt gets "shocks" of pain into his chest as well. Hx of CAD, cardiomyopathy, CHF, HTN, MI, pulmonary emboli, cardiac cath, CABG, coronary pressure wire, sternal plating- 2018. Former smoker. EXAM: CHEST - 2 VIEW COMPARISON:  11/09/2017. FINDINGS: Stable changes from previous CABG surgery. Cardiac silhouette is normal in size and configuration. No mediastinal or hilar masses. No evidence of adenopathy. Mild prominence of the interstitial markings at the bases. Lungs otherwise clear. No pleural effusion or pneumothorax. Skeletal structures are intact. IMPRESSION: No acute cardiopulmonary disease. Electronically Signed   By: Lajean Manes M.D.   On: 01/05/2019 11:38   CT Head Wo Contrast  Result Date: 01/05/2019 CLINICAL DATA:  Pt to ED via [facility] from home for chest pain that started last night. Pt had to use 3 of his Nitro last night. Pt states that he pain went away and he was able to go to bed. This morning he states that he got up and went out to get the paper. Pt was reading the paper and felt ok, when he got up the room was spinning. Pt states that he was very weak and felt off balance. Pt appears to be having some trouble getting this thoughts out. EXAM: CT HEAD WITHOUT CONTRAST TECHNIQUE: Contiguous axial images were obtained from the base of the skull through the vertex without intravenous contrast. COMPARISON:  02/01/2018 FINDINGS: Brain: No evidence of acute infarction, hemorrhage, hydrocephalus, extra-axial collection or mass lesion/mass effect. Vascular: No hyperdense vessel or unexpected calcification. Skull: Normal. Negative for fracture or focal lesion. Sinuses/Orbits: Globes and orbits  are unremarkable. Visualized sinuses and mastoid air cells are clear. Other: None. IMPRESSION: 1. Normal CT scan of the brain for age. No change from the prior study. Electronically Signed   By: Lajean Manes  M.D.   On: 01/05/2019 11:28   MR BRAIN WO CONTRAST  Result Date: 01/05/2019 CLINICAL DATA:  Neuro deficit.  Rule out stroke. EXAM: MRI HEAD WITHOUT CONTRAST TECHNIQUE: Multiplanar, multiecho pulse sequences of the brain and surrounding structures were obtained without intravenous contrast. COMPARISON:  MRI head 11/20/2016 FINDINGS: Brain: Negative for acute infarct. Few small white matter hyperintensities bilaterally unchanged from the prior study. Chronic microhemorrhage left cerebellum. Negative for mass or edema. Vascular: Normal arterial flow voids. Hypoplastic distal right vertebral artery unchanged. Skull and upper cervical spine: No focal skeletal lesion. Sinuses/Orbits: Mild mucosal edema paranasal sinuses. Bilateral cataract surgery Other: None IMPRESSION: Negative for acute infarct. 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 Electronically Signed   By: Franchot Gallo M.D.   On: 01/05/2019 15:56   CARDIAC CATHETERIZATION  Result Date: 12/10/2018  Ost LM to Mid LM lesion is 40% stenosed.  Ost Cx to Prox Cx lesion is 90% stenosed.  2nd Mrg lesion is 100% stenosed.  Prox LAD lesion is 20% stenosed.  Previously placed Ost RCA to Mid RCA stent (unknown type) is widely patent.  Previously placed Mid RCA stent (unknown type) is widely patent.  1.  One-vessel coronary artery disease with 95% stenosis ostium small caliber left circumflex with occluded OM 2 which appears unchanged compared to prior angiogram 2.  40% stenosis left main, with negative IFR 3.  Patent LIMA to distal LAD, with competitive flow via native vessel 4.  Patent SVG to ramus intermedius branch 5.  Mildly reduced left ventricular function, with estimated LVEF 40 to 45% Recommendations 1.  Continue medical therapy    Time Spent in minutes 35   Vikash Nest M.D on 01/06/2019 at 12:50 PM  Between 7am to 7pm - Pager - [phone]  After 7pm go to [email or web address] - password [facility]  [facility] -   Office  [phone]

## 2019-01-06 NOTE — Procedures (Signed)
ELECTROENCEPHALOGRAM REPORT   Patient: Scott [name]       Room #: 112A-AA EEG No. ID: [id number] Age: 78 y.o.        Sex: male Referring Physician: Dhungel Report Date:  01/06/2019        Interpreting Physician: Alexis Goodell  History: [name] is an 78 y.o. male with episodic dizziness  Medications:  Atorvastatin, Zetia, Imdur, Ranexa, Xarelto  Conditions of Recording:  This is a 21 channel routine scalp EEG performed with bipolar and monopolar montages arranged in accordance to the international 10/20 system of electrode placement. One channel was dedicated to EKG recording.  The patient is in the awake and drowsy states.  Description:  Artifact is prominent during the recording often obscuring the background rhythm. When able to be visualized the waking background activity consists of a low voltage, symmetrical, fairly well organized, 9 Hz alpha activity, seen from the parieto-occipital and posterior temporal regions.  This is poorly sustained.  Low voltage fast activity, poorly organized, is seen anteriorly and is at times superimposed on more posterior regions.  A mixture of theta and alpha rhythms are seen from the central and temporal regions. The patient drowses with slowing to irregular, low voltage theta and beta activity.   Stage II sleep is not obtained. No epileptiform activity is noted.   Hyperventilation was not performed.  Intermittent photic stimulation was performed but failed to illicit any abnormalities.  IMPRESSION: Normal electroencephalogram, awake, drowsy and with activation procedures. There are no focal lateralizing or epileptiform features.   Alexis Goodell, MD Neurology [phone] 01/06/2019, 8:45 PM

## 2019-01-07 DIAGNOSIS — R55 Syncope and collapse: Secondary | ICD-10-CM

## 2019-01-07 DIAGNOSIS — R42 Dizziness and giddiness: Secondary | ICD-10-CM

## 2019-01-07 DIAGNOSIS — D519 Vitamin B12 deficiency anemia, unspecified: Secondary | ICD-10-CM

## 2019-01-07 DIAGNOSIS — F410 Panic disorder [episodic paroxysmal anxiety] without agoraphobia: Secondary | ICD-10-CM

## 2019-01-07 MED ORDER — CYANOCOBALAMIN 1000 MCG PO TABS: 1000.0000 ug | ORAL_TABLET | Freq: Every day | ORAL | 3 refills | Status: AC

## 2019-01-07 NOTE — Progress Notes (Signed)
IV removed before discharge. Educated patient on discharge instructions and discharge medications. Patient and wife both agreed that they understood and did not have any questions.

## 2019-01-07 NOTE — Discharge Summary (Addendum)
Physician Discharge Summary  Scott [name] [id number] DOB: 27-Mar-1940 DOA: 01/05/2019  PCP: Baxter Hire, MD  Admit date: 01/05/2019 Discharge date: 01/07/2019  Admitted From: Home Disposition: Home  Recommendations for Outpatient Follow-up:  1. Follow up with PCP in 1-2 weeks 2. Outpatient follow-up with ENT in 4 weeks. 3. Patient instructed to refrain from heights and possibly avoiding operating heavy machinery.  Home Health: None Equipment/Devices: Has walker at home for as needed use  Discharge Condition: Fair CODE STATUS: Full code Diet recommendation: Heart Healthy     Discharge Diagnoses:  Principal Problem:   Postural dizziness with near syncope   Active Problems:   Chest pain with low risk for cardiac etiology   Coronary artery disease involving coronary bypass graft of native heart with angina pectoris (HCC)   Bilateral carotid artery disease (HCC)   PAF (paroxysmal atrial fibrillation) (HCC)   Panic attack   Weakness  Brief narrative/HPI 78 year old male with coronary artery disease, recent cardiac catheter chest pain, chronic systolic CHF, hyperlipidemia, hypertension, 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  He reports that he sharp substernal chest pain which subsided on its own.  He was mildly dizzy but on the morning of admission he was unable to stand from the chair on 2 occasions.  He informs that he was having difficulty getting his thoughts together which is new.  No focal deficit, tingling or numbness.  Per his wife he has had several months of spells where he stares out into space for a few seconds and does not interact with people around. In the ED vitals were stable.  High-sensitivity troponin was negative.  COVID-19 negative.  Head CT without acute findings.  MRI brain done was negative for acute findings.    Hospital course  Principal problem  Near syncope and? TIA CT head  and MRI brain negative for acute finding.  Gets periodic carotid ultrasound with his cardiologist with nonsignificant stenosis. Continue statin. Orthostasis negative. Patient does have low B12 (184).  Started supplement.  TSH normal. EEG negative for seizures. No history of alcohol use, illicit drug use. Neurology consult appreciated.  Recommend this is likely multifactorial associated with panic attacks with polyneuropathy symptoms.  Patient has been evaluated by neurology over the past 2 years on 2 different occasions for similar symptoms. Patient reports that he uses some machinery at work and even gets on a ladder to work on the ceiling.  We have instructed him to refrain from heights if possible and also avoid heavy machinery use.  Spoke with ENT to evaluate for possible inner ear pathology and recommend vestibular rehab if symptoms concerning and outpatient follow-up.  Patient symptoms have currently resolved and able to ambulate without difficulty so did not require any vestibular rehab per PT.  He will follow up with ENT.   Active problems Chest pain with history of CAD Chest pain symptoms appear noncardiac.  Troponin negative and EKG unremarkable. Recent cardiac cath with 95% stenosis of the ostium and 40% stenosis of the left main.  EF of 40-45%.  Patient euvolemic. Cardiology have recommended for medical management. Continue Ranexa twice daily.    History of PE Continue Xarelto  B12 deficiency (184) Added supplement.    Family Communication  : daughter updated on the phone  Disposition Plan  :  Home  Consults  : Cardiology, neurology  Procedures  : CT head, MRI brain, EEG   Discharge Instructions  Allergies as of 01/07/2019      Reactions   Contrast Media [iodinated Diagnostic Agents] Hives   Metrizamide Hives   Oxycodone Hcl Itching   Vicodin [hydrocodone-acetaminophen] Itching      Medication List    TAKE these medications   atorvastatin 80 MG  tablet Commonly known as: LIPITOR Take 1 tablet (80 mg total) by mouth daily at 6 PM.   cyanocobalamin 1000 MCG tablet Take 1 tablet (1,000 mcg total) by mouth daily.   ezetimibe 10 MG tablet Commonly known as: ZETIA Take 1 tablet (10 mg total) by mouth daily.   ipratropium 0.06 % nasal spray Commonly known as: ATROVENT Place 2 sprays into both nostrils 3 (three) times daily as needed for rhinitis or allergies.   isosorbide mononitrate 60 MG 24 hr tablet Commonly known as: IMDUR Take 60 mg by mouth 2 (two) times daily.   nitroGLYCERIN 0.4 MG SL tablet Commonly known as: NITROSTAT Place 1 tablet (0.4 mg total) under the tongue every 5 (five) minutes as needed for chest pain.   ranolazine 1000 MG SR tablet Commonly known as: RANEXA Take 1 tablet (1,000 mg total) by mouth 2 (two) times daily.   rivaroxaban 20 MG Tabs tablet Commonly known as: Xarelto Take 1 tablet (20 mg total) by mouth daily.      Follow-up Information    Baxter Hire, MD Follow up in 2 week(s).   Specialty: Internal Medicine Contact information: [place] [address] [phone]        Minna Merritts, MD .   Specialty: Cardiology Contact information: [place] [address] [phone]        Beverly Gust, MD. Call in 4 week(s).   Specialty: Otolaryngology Contact information: [place] [address] [phone]          Allergies  Allergen Reactions  . Contrast Media [Iodinated Diagnostic Agents] Hives  . Metrizamide Hives  . Oxycodone Hcl Itching  . Vicodin [Hydrocodone-Acetaminophen] Itching      Procedures/Studies: DG Chest 2 View  Result Date: 01/05/2019 CLINICAL DATA:  Chest pain that started last night, weakness this morning. Pt states that he is currently having pain in his central chest, pt gets "shocks" of pain into his chest as well. Hx of CAD, cardiomyopathy, CHF,  HTN, MI, pulmonary emboli, cardiac cath, CABG, coronary pressure wire, sternal plating- 2018. Former smoker. EXAM: CHEST - 2 VIEW COMPARISON:  11/09/2017. FINDINGS: Stable changes from previous CABG surgery. Cardiac silhouette is normal in size and configuration. No mediastinal or hilar masses. No evidence of adenopathy. Mild prominence of the interstitial markings at the bases. Lungs otherwise clear. No pleural effusion or pneumothorax. Skeletal structures are intact. IMPRESSION: No acute cardiopulmonary disease. Electronically Signed   By: Lajean Manes M.D.   On: 01/05/2019 11:38   CT Head Wo Contrast  Result Date: 01/05/2019 CLINICAL DATA:  Pt to ED via [facility] from home for chest pain that started last night. Pt had to use 3 of his Nitro last night. Pt states that he pain went away and he was able to go to bed. This morning he states that he got up and went out to get the paper. Pt was reading the paper and felt ok, when he got up the room was spinning. Pt states that he was very weak and felt off balance. Pt appears to be having some trouble getting this thoughts out. EXAM: CT HEAD WITHOUT  CONTRAST TECHNIQUE: Contiguous axial images were obtained from the base of the skull through the vertex without intravenous contrast. COMPARISON:  02/01/2018 FINDINGS: Brain: No evidence of acute infarction, hemorrhage, hydrocephalus, extra-axial collection or mass lesion/mass effect. Vascular: No hyperdense vessel or unexpected calcification. Skull: Normal. Negative for fracture or focal lesion. Sinuses/Orbits: Globes and orbits are unremarkable. Visualized sinuses and mastoid air cells are clear. Other: None. IMPRESSION: 1. Normal CT scan of the brain for age. No change from the prior study. Electronically Signed   By: Lajean Manes M.D.   On: 01/05/2019 11:28   MR BRAIN WO CONTRAST  Result Date: 01/05/2019 CLINICAL DATA:  Neuro deficit.  Rule out stroke. EXAM: MRI HEAD WITHOUT CONTRAST TECHNIQUE: Multiplanar,  multiecho pulse sequences of the brain and surrounding structures were obtained without intravenous contrast. COMPARISON:  MRI head 11/20/2016 FINDINGS: Brain: Negative for acute infarct. Few small white matter hyperintensities bilaterally unchanged from the prior study. Chronic microhemorrhage left cerebellum. Negative for mass or edema. Vascular: Normal arterial flow voids. Hypoplastic distal right vertebral artery unchanged. Skull and upper cervical spine: No focal skeletal lesion. Sinuses/Orbits: Mild mucosal edema paranasal sinuses. Bilateral cataract surgery Other: None IMPRESSION: Negative for acute infarct. 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 Electronically Signed   By: Franchot Gallo M.D.   On: 01/05/2019 15:56   CARDIAC CATHETERIZATION  Result Date: 12/10/2018  Ost LM to Mid LM lesion is 40% stenosed.  Ost Cx to Prox Cx lesion is 90% stenosed.  2nd Mrg lesion is 100% stenosed.  Prox LAD lesion is 20% stenosed.  Previously placed Ost RCA to Mid RCA stent (unknown type) is widely patent.  Previously placed Mid RCA stent (unknown type) is widely patent.  1.  One-vessel coronary artery disease with 95% stenosis ostium small caliber left circumflex with occluded OM 2 which appears unchanged compared to prior angiogram 2.  40% stenosis left main, with negative IFR 3.  Patent LIMA to distal LAD, with competitive flow via native vessel 4.  Patent SVG to ramus intermedius branch 5.  Mildly reduced left ventricular function, with estimated LVEF 40 to 45% Recommendations 1.  Continue medical therapy   EEG adult  Result Date: 01/06/2019 Alexis Goodell, MD     01/06/2019  8:49 PM ELECTROENCEPHALOGRAM REPORT Patient: Scott [name]       Room #: 112A-AA EEG No. ID: [id number] Age: 78 y.o.        Sex: male Referring Physician: Pearley Millington Report Date:  01/06/2019       Interpreting Physician: Alexis Goodell History: DYSEN HEINTZELMAN is an 78 y.o. male with episodic dizziness  Medications: Atorvastatin, Zetia, Imdur, Ranexa, Xarelto Conditions of Recording:  This is a 21 channel routine scalp EEG performed with bipolar and monopolar montages arranged in accordance to the international 10/20 system of electrode placement. One channel was dedicated to EKG recording. The patient is in the awake and drowsy states. Description:  Artifact is prominent during the recording often obscuring the background rhythm. When able to be visualized the waking background activity consists of a low voltage, symmetrical, fairly well organized, 9 Hz alpha activity, seen from the parieto-occipital and posterior temporal regions.  This is poorly sustained.  Low voltage fast activity, poorly organized, is seen anteriorly and is at times superimposed on more posterior regions.  A mixture of theta and alpha rhythms are seen from the central and temporal regions. The patient drowses with slowing to irregular, low voltage theta and beta activity.  Stage  II sleep is not obtained. No epileptiform activity is noted.  Hyperventilation was not performed.  Intermittent photic stimulation was performed but failed to illicit any abnormalities. IMPRESSION: Normal electroencephalogram, awake, drowsy and with activation procedures. There are no focal lateralizing or epileptiform features. Alexis Goodell, MD Neurology [phone] 01/06/2019, 8:45 PM     Subjective: Seen and examined.  Denies any dizziness or weakness.  Able to ambulate without difficulty.  Discharge Exam: Vitals:   01/07/19 0423 01/07/19 0850  BP: 108/75 126/63  Pulse: 69 63  Resp: 17 20  Temp: 97.8 F (36.6 C) 97.6 F (36.4 C)  SpO2: 99% 100%   Vitals:   01/06/19 1443 01/06/19 1944 01/07/19 0423 01/07/19 0850  BP: 111/69 105/61 108/75 126/63  Pulse: (!) 58 (!) 57 69 63  Resp: 18 16 17 20   Temp: 97.9 F (36.6 C) 98.3 F (36.8 C) 97.8 F (36.6 C) 97.6 F (36.4 C)  TempSrc: Oral Oral Oral   SpO2: 99% 99% 99% 100%  Weight:       Height:        General: Elderly male not in distress HEENT: Moist mucosa, supple neck Chest: Clear CVs: Normal S1-S2, no murmur GI: Soft, nondistended, nontender Musculoskeletal: Warm, no edema CNS: Alert and oriented, nonfocal     The results of significant diagnostics from this hospitalization (including imaging, microbiology, ancillary and laboratory) are listed below for reference.     Microbiology: Recent Results (from the past 240 hour(s))  SARS CORONAVIRUS 2 (TAT 6-24 HRS) Nasopharyngeal Nasopharyngeal Swab     Status: None   Collection Time: 01/05/19 12:36 PM   Specimen: Nasopharyngeal Swab  Result Value Ref Range Status   SARS Coronavirus 2 NEGATIVE NEGATIVE Final    Comment: (NOTE) SARS-CoV-2 target nucleic acids are NOT DETECTED. The SARS-CoV-2 RNA is generally detectable in upper and lower respiratory specimens during the acute phase of infection. Negative results do not preclude SARS-CoV-2 infection, do not rule out co-infections with other pathogens, and should not be used as the sole basis for treatment or other patient management decisions. Negative results must be combined with clinical observations, patient history, and epidemiological information. The expected result is Negative. Fact Sheet for Patients: SugarRoll.be Fact Sheet for Healthcare Providers: https://www.woods-mathews.com/ This test is not yet approved or cleared by the [place] FDA and  has been authorized for detection and/or diagnosis of SARS-CoV-2 by FDA under an Emergency Use Authorization (EUA). This EUA will remain  in effect (meaning this test can be used) for the duration of the COVID-19 declaration under Section 56 4(b)(1) of the Act, 21 U.S.C. section 360bbb-3(b)(1), unless the authorization is terminated or revoked sooner. Performed at [facility], [place] [address] [place], [place] [address]      Labs: BNP (last 3  results) No results for input(s): BNP in the last 8760 hours. Basic Metabolic Panel: Recent Labs  Lab 01/05/19 1236 01/06/19 0510  NA 138 140  K 4.2 4.1  CL 105 108  CO2 23 24  GLUCOSE 96 96  BUN 22 20  CREATININE 1.20 1.14  CALCIUM 8.7* 8.5*   Liver Function Tests: Recent Labs  Lab 01/06/19 0510  AST 18  ALT 17  ALKPHOS 33*  BILITOT 0.9  PROT 6.1*  ALBUMIN 3.2*   No results for input(s): LIPASE, AMYLASE in the last 168 hours. No results for input(s): AMMONIA in the last 168 hours. CBC: Recent Labs  Lab 01/05/19 1031 01/06/19 0510  WBC 4.9 5.0  HGB  15.1 14.2  HCT 43.1 40.2  MCV 91.7 92.0  PLT 203 189   Cardiac Enzymes: No results for input(s): CKTOTAL, CKMB, CKMBINDEX, TROPONINI in the last 168 hours. BNP: Invalid input(s): POCBNP CBG: No results for input(s): GLUCAP in the last 168 hours. D-Dimer No results for input(s): DDIMER in the last 72 hours. Hgb A1c No results for input(s): HGBA1C in the last 72 hours. Lipid Profile No results for input(s): CHOL, HDL, LDLCALC, TRIG, CHOLHDL, LDLDIRECT in the last 72 hours. Thyroid function studies Recent Labs    01/05/19 1732  TSH 1.799   Anemia work up Recent Labs    01/05/19 1732  VITAMINB12 182   Urinalysis    Component Value Date/Time   COLORURINE YELLOW (A) 12/08/2016 0938   APPEARANCEUR Hazy (A) 09/24/2018 1108   LABSPEC 1.015 12/08/2016 0938   PHURINE 6.0 12/08/2016 0938   GLUCOSEU Negative 09/24/2018 1108   HGBUR SMALL (A) 12/08/2016 0938   BILIRUBINUR Negative 09/24/2018 1108   KETONESUR NEGATIVE 12/08/2016 0938   PROTEINUR Negative 09/24/2018 1108   PROTEINUR NEGATIVE 12/08/2016 0938   NITRITE Negative 09/24/2018 1108   NITRITE NEGATIVE 12/08/2016 0938   LEUKOCYTESUR Negative 09/24/2018 1108   Sepsis Labs Invalid input(s): PROCALCITONIN,  WBC,  LACTICIDVEN Microbiology Recent Results (from the past 240 hour(s))  SARS CORONAVIRUS 2 (TAT 6-24 HRS) Nasopharyngeal Nasopharyngeal Swab      Status: None   Collection Time: 01/05/19 12:36 PM   Specimen: Nasopharyngeal Swab  Result Value Ref Range Status   SARS Coronavirus 2 NEGATIVE NEGATIVE Final    Comment: (NOTE) SARS-CoV-2 target nucleic acids are NOT DETECTED. The SARS-CoV-2 RNA is generally detectable in upper and lower respiratory specimens during the acute phase of infection. Negative results do not preclude SARS-CoV-2 infection, do not rule out co-infections with other pathogens, and should not be used as the sole basis for treatment or other patient management decisions. Negative results must be combined with clinical observations, patient history, and epidemiological information. The expected result is Negative. Fact Sheet for Patients: SugarRoll.be Fact Sheet for Healthcare Providers: https://www.woods-mathews.com/ This test is not yet approved or cleared by the [place] FDA and  has been authorized for detection and/or diagnosis of SARS-CoV-2 by FDA under an Emergency Use Authorization (EUA). This EUA will remain  in effect (meaning this test can be used) for the duration of the COVID-19 declaration under Section 56 4(b)(1) of the Act, 21 U.S.C. section 360bbb-3(b)(1), unless the authorization is terminated or revoked sooner. Performed at [facility], [place] [address], [place], [place] [address]      Time coordinating discharge: 35 minutes  SIGNED:   Louellen Molder, MD  [facility] 01/07/2019, 11:44 AM Pager   If 7PM-7AM, please contact night-coverage [email or web address] Password [email or web address]

## 2019-01-07 NOTE — Evaluation (Signed)
Physical Therapy Evaluation Patient Details Name: Scott [name] MRN: [id number] DOB: Mar 20, 1940 Today's Date: 01/07/2019   History of Present Illness  Patient is a pleasant 78 year old male admitted to the hospital for weakness and syncope. PMH includes CAD, CHF, HTN, HLD, prior PE on chronic Xarelto, and MI. Nursing cleared patient for PT due to EKG and EEG.  Clinical Impression  Patient is a pleasant 78 year old male who was in bed with wife at bedside upon PT arrival. Patient is eager to participate in PT as he wants to return home. Patient states he is at his baseline. He is able to transition supine to sit with minimal deficit noted and only slight increase in dizziness with transfer. Patient requires cueing for breathing and pausing between transitions from supine to sit and sit to stand to decrease episodes of dizziness and increase safety. Patient agreeable to attempt and reports no dizziness upon transition from sit to stand after 4-5 seconds. Patient ambulated without AD two laps around nursing station with CGA and no episodes of dizziness, LOB, or unsteadiness. Due to patient being at his baseline functioning patient does not need further PT at this time. We will be happy to see this patient again in the future as needed/consulted.     Follow Up Recommendations No PT follow up    Equipment Recommendations  Other (comment)(shower chair due to history of dizziness)    Recommendations for Other Services       Precautions / Restrictions Precautions Precautions: Fall Restrictions Weight Bearing Restrictions: No      Mobility  Bed Mobility Overal bed mobility: Independent             General bed mobility comments: supine to sit EOB without use of hands, no fatigue, slight dizziness initially upon sitting EOB, faded quickly.  Transfers Overall transfer level: Independent               General transfer comment: Transfers with CGA due to history of dizziness, able to  perform without use of hands, slight dizziness initally that stopped within 4-5 seconds  Ambulation/Gait Ambulation/Gait assistance: Independent;Min guard Gait Distance (Feet): 280 Feet Assistive device: None   Gait velocity: >1.0 m/s   General Gait Details: Patient ambulates with functional gait speed. slight kick out of LLE with initial swing phase, no episodes of dizziness or LOB.  Stairs            Wheelchair Mobility    Modified Rankin (Stroke Patients Only)       Balance Overall balance assessment: No apparent balance deficits (not formally assessed)(static stand 2 minutes no LOB no sway)                                           Pertinent Vitals/Pain Pain Assessment: No/denies pain    Home Living Family/patient expects to be discharged to:: Private residence Living Arrangements: Spouse/significant other Available Help at Discharge: Family Type of Home: House Home Access: Level entry     Home Layout: One level Home Equipment: Walker - 2 wheels;Grab bars - tub/shower;Grab bars - toilet Additional Comments: Patient has a walker but does not use it.    Prior Function Level of Independence: Independent         Comments: Patient reports indep without AD for ADLs and mobility. 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     Hand Dominance   Dominant Hand: Right    Extremity/Trunk Assessment   Upper Extremity Assessment Upper Extremity Assessment: Overall WFL for tasks assessed    Lower Extremity Assessment Lower Extremity Assessment: Overall WFL for tasks assessed(grossly 4+/5 bilaterally with good coordination bilaterally.)    Cervical / Trunk Assessment Cervical / Trunk Assessment: Normal  Communication   Communication: No difficulties  Cognition Arousal/Alertness: Awake/alert Behavior During Therapy: WFL for tasks assessed/performed Overall Cognitive Status: Within Functional Limits for tasks  assessed                                 General Comments: Eager to participate in PT, eager to return home      General Comments General comments (skin integrity, edema, etc.): bruising of L hand , appears well nourished and cared for    Exercises General Exercises - Lower Extremity Ankle Circles/Pumps: Strengthening;Both;10 reps;Supine Heel Slides: AROM;Both;5 reps;Supine Other Exercises Other Exercises: Patient educated on breathing techniques with tranfsers to decrease episodes of dizziness. Educated on safety awareness with dizziness.   Assessment/Plan    PT Assessment Patent does not need any further PT services  PT Problem List         PT Treatment Interventions      PT Goals (Current goals can be found in the Care Plan section)  Acute Rehab PT Goals Patient Stated Goal: to return home PT Goal Formulation: With patient Time For Goal Achievement: 01/21/19 Potential to Achieve Goals: Fair Additional Goals Additional Goal #1: Patient will perform 5 breaths between transitions supine to sit and sit to stand to decrease episodes of dizziness with mobility.    Frequency     Barriers to discharge        Co-evaluation               AM-PAC PT "6 Clicks" Mobility  Outcome Measure Help needed turning from your back to your side while in a flat bed without using bedrails?: None Help needed moving from lying on your back to sitting on the side of a flat bed without using bedrails?: None Help needed moving to and from a bed to a chair (including a wheelchair)?: None Help needed standing up from a chair using your arms (e.g., wheelchair or bedside chair)?: None Help needed to walk in hospital room?: None Help needed climbing 3-5 steps with a railing? : A Little 6 Click Score: 23    End of Session Equipment Utilized During Treatment: Gait belt Activity Tolerance: Patient tolerated treatment well Patient left: in bed;with call bell/phone within  reach;with nursing/sitter in room;with bed alarm set Nurse Communication: Mobility status PT Visit Diagnosis: Unsteadiness on feet (R26.81)    Time: [id number] PT Time Calculation (min) (ACUTE ONLY): 12 min   Charges:   PT Evaluation $PT Eval Low Complexity: 1 Low          Janna Arch, PT, DPT    Janna Arch 01/07/2019, 11:25 AM

## 2019-01-07 NOTE — Discharge Instructions (Signed)
Near-Syncope Near-syncope is when you suddenly get weak or dizzy, or you feel like you might pass out (faint). This may also be called presyncope. This is due to a lack of blood flow to the brain. During an episode of near-syncope, you may:  Feel dizzy, weak, or light-headed.  Feel sick to your stomach (nauseous).  See all white or all black.  See spots.  Have cold, clammy skin. This condition is caused by a sudden decrease in blood flow to the brain. This decrease can result from various causes, but most of those causes are not dangerous. However, near-syncope may be a sign of a serious medical problem, so it is important to seek medical care. Follow these instructions at home: Medicines  Take over-the-counter and prescription medicines only as told by your doctor.  If you are taking blood pressure or heart medicine, get up slowly and spend many minutes getting ready to sit and then stand. This can help with dizziness. General instructions  Be aware of any changes in your symptoms.  Talk with your doctor about your symptoms. You may need to have testing to find the cause of your near-syncope.  If you start to feel like you might pass out, lie down right away. Raise (elevate) your feet above the level of your heart. Breathe deeply and steadily. Wait until all of the symptoms are gone.  Have someone stay with you until you feel stable.  Do not drive, use machinery, or play sports until your doctor says it is okay.  Drink enough fluid to keep your pee (urine) pale yellow.  Keep all follow-up visits as told by your doctor. This is important. Get help right away if you:  Have a seizure.  Have pain in your: ? Chest. ? Belly (abdomen). ? Back.  Faint once or more than once.  Have a very bad headache.  Are bleeding from your mouth or butt.  Have black or tarry poop (stool).  Have a very fast or uneven heartbeat (palpitations).  Are mixed up (confused).  Have trouble  walking.  Are very weak.  Have trouble seeing. These symptoms may be an emergency. Do not wait to see if the symptoms will go away. Get medical help right away. Call your local emergency services (911 in the [place] Do not drive yourself to the hospital. Summary  Near-syncope is when you suddenly get weak or dizzy, or you feel like you might pass out (faint).  This condition is caused by a lack of blood flow to the brain.  Near-syncope may be a sign of a serious medical problem, so it is important to seek medical care. This information is not intended to replace advice given to you by your health care provider. Make sure you discuss any questions you have with your health care provider. Document Released: 06/28/2007 Document Revised: 05/03/2018 Document Reviewed: 11/28/2017 Elsevier Patient Education  2020 [employer]  

## 2019-01-07 NOTE — Progress Notes (Signed)
OT Screen Note  Patient Details Name: Scott [name] MRN: [id number] DOB: [birth year]   OT Screen:    Reason Eval/Treat Not Completed: OT screened, no needs identified, will sign off;Other (comment). Thank you for the OT consult. Order received and chart reviewed. This [facility]ia spoke with physical therapist who indicated pt is generally back to baseline level for completing ADL tasks with good strength and mobility throughout BUE. Pt reporting symptoms have resolved. No skilled OT needs identified. Will sign off at this time. Please re-consult if additional OT needs arise during this admission.   Shara Blazing, M.S., OTR/L Ascom: [phone] 01/07/19, 11:55 AM

## 2019-01-07 NOTE — Progress Notes (Signed)
Subjective: Patient able to stand but still experiences some imbalance at times.    Objective: Current vital signs: BP 126/63 (BP Location: Right Arm)   Pulse 63   Temp 97.6 F (36.4 C)   Resp 20   Ht 5\' 10"  (1.778 m)   Wt 80.2 kg   SpO2 100%   BMI 25.37 kg/m  Vital signs in last 24 hours: Temp:  [97.6 F (36.4 C)-98.3 F (36.8 C)] 97.6 F (36.4 C) (12/15 0850) Pulse Rate:  [57-69] 63 (12/15 0850) Resp:  [16-20] 20 (12/15 0850) BP: (105-126)/(61-75) 126/63 (12/15 0850) SpO2:  [99 %-100 %] 100 % (12/15 0850)  Intake/Output from previous day: 12/14 0701 - 12/15 0700 In: 720 [P.O.:720] Out: -  Intake/Output this shift: Total I/O In: 480 [P.O.:480] Out: -  Nutritional status:  Diet Order            Diet Heart Room service appropriate? Yes; Fluid consistency: Thin  Diet effective now              Neurologic Exam: Mental Status: Alert, oriented, thought content appropriate.  Speech fluent without evidence of aphasia.  Able to follow 3 step commands without difficulty. Cranial Nerves: II: Discs flat bilaterally; Visual fields grossly normal, pupils equal, round, reactive to light and accommodation III,IV, VI: ptosis not present, extra-ocular motions intact bilaterally V,VII: smile symmetric, facial light touch sensation normal bilaterally VIII: hearing normal bilaterally IX,X: gag reflex present XI: bilateral shoulder shrug XII: midline tongue extension Motor: Right :  Upper extremity   5/5                                      Left:     Upper extremity   5/5             Lower extremity   5/5                                                  Lower extremity   5/5 Tone and bulk:normal tone throughout; no atrophy noted Sensory: Pinprick and light touch intact throughout, bilaterally Deep Tendon Reflexes: Symmetric throughout Plantars: Right: mute                              Left: mute Cerebellar: Initial waving with standing but able to auto-correct   Lab  Results: Basic Metabolic Panel: Recent Labs  Lab 01/05/19 1236 01/06/19 0510  NA 138 140  K 4.2 4.1  CL 105 108  CO2 23 24  GLUCOSE 96 96  BUN 22 20  CREATININE 1.20 1.14  CALCIUM 8.7* 8.5*    Liver Function Tests: Recent Labs  Lab 01/06/19 0510  AST 18  ALT 17  ALKPHOS 33*  BILITOT 0.9  PROT 6.1*  ALBUMIN 3.2*   No results for input(s): LIPASE, AMYLASE in the last 168 hours. No results for input(s): AMMONIA in the last 168 hours.  CBC: Recent Labs  Lab 01/05/19 1031 01/06/19 0510  WBC 4.9 5.0  HGB 15.1 14.2  HCT 43.1 40.2  MCV 91.7 92.0  PLT 203 189    Cardiac Enzymes: No results for input(s): CKTOTAL, CKMB, CKMBINDEX, TROPONINI in the last 168 hours.  Lipid Panel: No  results for input(s): CHOL, TRIG, HDL, CHOLHDL, VLDL, LDLCALC in the last 168 hours.  CBG: No results for input(s): GLUCAP in the last 168 hours.  Microbiology: Results for orders placed or performed during the hospital encounter of 01/05/19  SARS CORONAVIRUS 2 (TAT 6-24 HRS) Nasopharyngeal Nasopharyngeal Swab     Status: None   Collection Time: 01/05/19 12:36 PM   Specimen: Nasopharyngeal Swab  Result Value Ref Range Status   SARS Coronavirus 2 NEGATIVE NEGATIVE Final    Comment: (NOTE) SARS-CoV-2 target nucleic acids are NOT DETECTED. The SARS-CoV-2 RNA is generally detectable in upper and lower respiratory specimens during the acute phase of infection. Negative results do not preclude SARS-CoV-2 infection, do not rule out co-infections with other pathogens, and should not be used as the sole basis for treatment or other patient management decisions. Negative results must be combined with clinical observations, patient history, and epidemiological information. The expected result is Negative. Fact Sheet for Patients: SugarRoll.be Fact Sheet for Healthcare Providers: https://www.woods-mathews.com/ This test is not yet approved or cleared by  the [place] FDA and  has been authorized for detection and/or diagnosis of SARS-CoV-2 by FDA under an Emergency Use Authorization (EUA). This EUA will remain  in effect (meaning this test can be used) for the duration of the COVID-19 declaration under Section 56 4(b)(1) of the Act, 21 U.S.C. section 360bbb-3(b)(1), unless the authorization is terminated or revoked sooner. Performed at [facility], [place] [address], [place], [place] [address]     Coagulation Studies: No results for input(s): LABPROT, INR in the last 72 hours.  Imaging: MR BRAIN WO CONTRAST  Result Date: 01/05/2019 CLINICAL DATA:  Neuro deficit.  Rule out stroke. EXAM: MRI HEAD WITHOUT CONTRAST TECHNIQUE: Multiplanar, multiecho pulse sequences of the brain and surrounding structures were obtained without intravenous contrast. COMPARISON:  MRI head 11/20/2016 FINDINGS: Brain: Negative for acute infarct. Few small white matter hyperintensities bilaterally unchanged from the prior study. Chronic microhemorrhage left cerebellum. Negative for mass or edema. Vascular: Normal arterial flow voids. Hypoplastic distal right vertebral artery unchanged. Skull and upper cervical spine: No focal skeletal lesion. Sinuses/Orbits: Mild mucosal edema paranasal sinuses. Bilateral cataract surgery Other: None IMPRESSION: Negative for acute infarct. 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 Electronically Signed   By: Franchot Gallo M.D.   On: 01/05/2019 15:56   EEG adult  Result Date: 01/06/2019 Alexis Goodell, MD     01/06/2019  8:49 PM ELECTROENCEPHALOGRAM REPORT Patient: Scott [name]       Room #: 112A-AA EEG No. ID: [id number] Age: 78 y.o.        Sex: male Referring Physician: Dhungel Report Date:  01/06/2019       Interpreting Physician: Alexis Goodell History: Scott [name] is an 78 y.o. male with episodic dizziness Medications: Atorvastatin, Zetia, Imdur, Ranexa, Xarelto Conditions of Recording:  This is  a 21 channel routine scalp EEG performed with bipolar and monopolar montages arranged in accordance to the international 10/20 system of electrode placement. One channel was dedicated to EKG recording. The patient is in the awake and drowsy states. Description:  Artifact is prominent during the recording often obscuring the background rhythm. When able to be visualized the waking background activity consists of a low voltage, symmetrical, fairly well organized, 9 Hz alpha activity, seen from the parieto-occipital and posterior temporal regions.  This is poorly sustained.  Low voltage fast activity, poorly organized, is seen anteriorly and is at times superimposed on more  posterior regions.  A mixture of theta and alpha rhythms are seen from the central and temporal regions. The patient drowses with slowing to irregular, low voltage theta and beta activity.  Stage II sleep is not obtained. No epileptiform activity is noted.  Hyperventilation was not performed.  Intermittent photic stimulation was performed but failed to illicit any abnormalities. IMPRESSION: Normal electroencephalogram, awake, drowsy and with activation procedures. There are no focal lateralizing or epileptiform features. Alexis Goodell, MD Neurology [phone] 01/06/2019, 8:45 PM    Medications:  I have reviewed the patient's current medications. Scheduled: . atorvastatin  80 mg Oral q1800  . ezetimibe  10 mg Oral Daily  . isosorbide mononitrate  60 mg Oral Daily  . ranolazine  1,000 mg Oral BID  . rivaroxaban  20 mg Oral Daily  . sodium chloride flush  3 mL Intravenous Once    Assessment/Plan: 78 year old male with recurrent, intermittent episodes of lightheadedness, yesterday to the point that he could not stand.  Improved today.  Neurological examination is nonfocal.  MRI of the brain reviewed and shows no acute changes.  Patient also with chest pain and starring episodes.  Cardiology following for chest pain.  Patient not  orthostatic.  TSH, B12 normal.  EEG shows no evidence of epileptiform activity. With history of multiple falls and PN, likely multifactorial in etiology.    Recommendations: 1. Vestibular therapy 2. Outpatient ENT evaluation 3. Patient to refrain from activities performed at heights   LOS: 1 day   Alexis Goodell, MD Neurology [phone] 01/07/2019  2:24 PM

## 2019-02-13 ENCOUNTER — Ambulatory Visit: Payer: Medicare HMO

## 2019-02-14 ENCOUNTER — Other Ambulatory Visit: Payer: Self-pay | Admitting: Infectious Diseases

## 2019-02-14 DIAGNOSIS — I6529 Occlusion and stenosis of unspecified carotid artery: Secondary | ICD-10-CM

## 2019-02-14 DIAGNOSIS — R42 Dizziness and giddiness: Secondary | ICD-10-CM

## 2019-02-17 ENCOUNTER — Ambulatory Visit
Admission: RE | Admit: 2019-02-17 | Discharge: 2019-02-17 | Disposition: A | Discharge status: Home / Self Care | Payer: Medicare HMO | Source: Ambulatory Visit | Attending: Infectious Diseases | Admitting: Infectious Diseases

## 2019-02-17 ENCOUNTER — Other Ambulatory Visit: Payer: Self-pay

## 2019-02-17 DIAGNOSIS — R42 Dizziness and giddiness: Secondary | ICD-10-CM | POA: Insufficient documentation

## 2019-02-17 DIAGNOSIS — I6782 Cerebral ischemia: Secondary | ICD-10-CM | POA: Diagnosis not present

## 2019-02-17 DIAGNOSIS — I6529 Occlusion and stenosis of unspecified carotid artery: Secondary | ICD-10-CM | POA: Diagnosis present

## 2019-02-18 ENCOUNTER — Other Ambulatory Visit: Payer: Self-pay

## 2019-02-18 ENCOUNTER — Ambulatory Visit
Admission: RE | Admit: 2019-02-18 | Discharge: 2019-02-18 | Disposition: A | Discharge status: Home / Self Care | Payer: Medicare HMO | Source: Ambulatory Visit | Attending: Pulmonary Disease | Admitting: Pulmonary Disease

## 2019-02-18 ENCOUNTER — Ambulatory Visit: Payer: Medicare HMO

## 2019-02-18 ENCOUNTER — Other Ambulatory Visit: Payer: Self-pay | Admitting: Pulmonary Disease

## 2019-02-18 ENCOUNTER — Other Ambulatory Visit (HOSPITAL_COMMUNITY): Payer: Self-pay | Admitting: Pulmonary Disease

## 2019-02-18 DIAGNOSIS — R079 Chest pain, unspecified: Secondary | ICD-10-CM | POA: Insufficient documentation

## 2019-02-18 DIAGNOSIS — R0602 Shortness of breath: Secondary | ICD-10-CM | POA: Diagnosis not present

## 2019-02-18 DIAGNOSIS — R918 Other nonspecific abnormal finding of lung field: Secondary | ICD-10-CM | POA: Diagnosis not present

## 2019-02-18 DIAGNOSIS — R0902 Hypoxemia: Secondary | ICD-10-CM | POA: Insufficient documentation

## 2019-02-18 LAB — POCT I-STAT CREATININE: Creatinine, Ser: 0.9 mg/dL (ref 0.61–1.24)

## 2019-02-18 MED ORDER — DIPHENHYDRAMINE HCL 25 MG PO CAPS: 50.0000 mg | ORAL_CAPSULE | Freq: Once | ORAL | Status: DC

## 2019-02-18 MED ORDER — METHYLPREDNISOLONE SODIUM SUCC 125 MG IJ SOLR: 40.0000 mg | Freq: Once | INTRAMUSCULAR | Status: AC

## 2019-02-18 MED ORDER — METHYLPREDNISOLONE SODIUM SUCC 125 MG IJ SOLR
INTRAMUSCULAR | Status: AC
  Administered 2019-02-18: 40 mg via INTRAVENOUS
  Filled 2019-02-18: qty 2

## 2019-02-18 MED ORDER — SODIUM CHLORIDE FLUSH 0.9 % IV SOLN
INTRAVENOUS | Status: AC
  Filled 2019-02-18: qty 10

## 2019-02-18 MED ORDER — DIPHENHYDRAMINE HCL 50 MG/ML IJ SOLN
50.0000 mg | Freq: Once | INTRAMUSCULAR | Status: AC
  Administered 2019-02-18: 50 mg via INTRAVENOUS

## 2019-02-18 MED ORDER — IOHEXOL 350 MG/ML SOLN
75.0000 mL | Freq: Once | INTRAVENOUS | Status: AC | PRN
  Administered 2019-02-18: 15:00:00 75 mL via INTRAVENOUS

## 2019-02-18 MED ORDER — DIPHENHYDRAMINE HCL 25 MG PO CAPS
ORAL_CAPSULE | ORAL | Status: AC
  Filled 2019-02-18: qty 2

## 2019-02-18 MED ORDER — DIPHENHYDRAMINE HCL 50 MG/ML IJ SOLN
INTRAMUSCULAR | Status: AC
  Filled 2019-02-18: qty 1

## 2019-02-20 ENCOUNTER — Other Ambulatory Visit: Payer: Self-pay | Admitting: Infectious Diseases

## 2019-02-20 DIAGNOSIS — I6529 Occlusion and stenosis of unspecified carotid artery: Secondary | ICD-10-CM

## 2019-02-24 ENCOUNTER — Other Ambulatory Visit: Payer: Self-pay

## 2019-02-24 ENCOUNTER — Ambulatory Visit
Admission: RE | Admit: 2019-02-24 | Discharge: 2019-02-24 | Disposition: A | Discharge status: Home / Self Care | Payer: Medicare HMO | Source: Ambulatory Visit | Attending: Infectious Diseases | Admitting: Infectious Diseases

## 2019-02-24 ENCOUNTER — Ambulatory Visit: Payer: Medicare HMO

## 2019-02-24 DIAGNOSIS — R42 Dizziness and giddiness: Secondary | ICD-10-CM | POA: Insufficient documentation

## 2019-02-24 DIAGNOSIS — I6523 Occlusion and stenosis of bilateral carotid arteries: Secondary | ICD-10-CM | POA: Insufficient documentation

## 2019-02-24 DIAGNOSIS — I6529 Occlusion and stenosis of unspecified carotid artery: Secondary | ICD-10-CM

## 2019-02-24 MED ORDER — GADOBUTROL 1 MMOL/ML IV SOLN
8.0000 mL | Freq: Once | INTRAVENOUS | Status: AC | PRN
  Administered 2019-02-24: 12:00:00 8 mL via INTRAVENOUS

## 2019-03-03 ENCOUNTER — Ambulatory Visit: Payer: Medicare HMO

## 2019-03-20 ENCOUNTER — Emergency Department: Payer: Medicare HMO

## 2019-03-20 ENCOUNTER — Inpatient Hospital Stay
Admission: EM | Admit: 2019-03-20 | Discharge: 2019-03-25 | DRG: 177 | Disposition: A | Discharge status: Home Health | Payer: Medicare HMO | Attending: Internal Medicine | Admitting: Internal Medicine

## 2019-03-20 ENCOUNTER — Other Ambulatory Visit: Payer: Medicare HMO

## 2019-03-20 ENCOUNTER — Encounter: Payer: Self-pay | Admitting: Emergency Medicine

## 2019-03-20 ENCOUNTER — Other Ambulatory Visit: Payer: Self-pay

## 2019-03-20 DIAGNOSIS — R042 Hemoptysis: Secondary | ICD-10-CM | POA: Diagnosis present

## 2019-03-20 DIAGNOSIS — I252 Old myocardial infarction: Secondary | ICD-10-CM | POA: Diagnosis not present

## 2019-03-20 DIAGNOSIS — Z0184 Encounter for antibody response examination: Secondary | ICD-10-CM

## 2019-03-20 DIAGNOSIS — U071 COVID-19: Principal | ICD-10-CM | POA: Diagnosis present

## 2019-03-20 DIAGNOSIS — J984 Other disorders of lung: Secondary | ICD-10-CM

## 2019-03-20 DIAGNOSIS — I340 Nonrheumatic mitral (valve) insufficiency: Secondary | ICD-10-CM | POA: Diagnosis present

## 2019-03-20 DIAGNOSIS — I480 Paroxysmal atrial fibrillation: Secondary | ICD-10-CM | POA: Diagnosis present

## 2019-03-20 DIAGNOSIS — J432 Centrilobular emphysema: Secondary | ICD-10-CM | POA: Diagnosis present

## 2019-03-20 DIAGNOSIS — J189 Pneumonia, unspecified organism: Secondary | ICD-10-CM | POA: Diagnosis present

## 2019-03-20 DIAGNOSIS — I10 Essential (primary) hypertension: Secondary | ICD-10-CM | POA: Diagnosis not present

## 2019-03-20 DIAGNOSIS — I959 Hypotension, unspecified: Secondary | ICD-10-CM | POA: Diagnosis present

## 2019-03-20 DIAGNOSIS — Z86718 Personal history of other venous thrombosis and embolism: Secondary | ICD-10-CM

## 2019-03-20 DIAGNOSIS — I2699 Other pulmonary embolism without acute cor pulmonale: Secondary | ICD-10-CM | POA: Diagnosis present

## 2019-03-20 DIAGNOSIS — E785 Hyperlipidemia, unspecified: Secondary | ICD-10-CM | POA: Diagnosis present

## 2019-03-20 DIAGNOSIS — Z79899 Other long term (current) drug therapy: Secondary | ICD-10-CM

## 2019-03-20 DIAGNOSIS — I255 Ischemic cardiomyopathy: Secondary | ICD-10-CM | POA: Diagnosis present

## 2019-03-20 DIAGNOSIS — I110 Hypertensive heart disease with heart failure: Secondary | ICD-10-CM | POA: Diagnosis present

## 2019-03-20 DIAGNOSIS — Z885 Allergy status to narcotic agent status: Secondary | ICD-10-CM | POA: Diagnosis not present

## 2019-03-20 DIAGNOSIS — I739 Peripheral vascular disease, unspecified: Secondary | ICD-10-CM | POA: Diagnosis present

## 2019-03-20 DIAGNOSIS — Z87891 Personal history of nicotine dependence: Secondary | ICD-10-CM

## 2019-03-20 DIAGNOSIS — I5032 Chronic diastolic (congestive) heart failure: Secondary | ICD-10-CM | POA: Diagnosis not present

## 2019-03-20 DIAGNOSIS — Z9981 Dependence on supplemental oxygen: Secondary | ICD-10-CM

## 2019-03-20 DIAGNOSIS — J1282 Pneumonia due to coronavirus disease 2019: Secondary | ICD-10-CM | POA: Diagnosis present

## 2019-03-20 DIAGNOSIS — Z951 Presence of aortocoronary bypass graft: Secondary | ICD-10-CM

## 2019-03-20 DIAGNOSIS — I5042 Chronic combined systolic (congestive) and diastolic (congestive) heart failure: Secondary | ICD-10-CM | POA: Diagnosis present

## 2019-03-20 DIAGNOSIS — R0602 Shortness of breath: Secondary | ICD-10-CM

## 2019-03-20 DIAGNOSIS — I2510 Atherosclerotic heart disease of native coronary artery without angina pectoris: Secondary | ICD-10-CM | POA: Diagnosis present

## 2019-03-20 DIAGNOSIS — J9621 Acute and chronic respiratory failure with hypoxia: Secondary | ICD-10-CM | POA: Diagnosis present

## 2019-03-20 DIAGNOSIS — J188 Other pneumonia, unspecified organism: Secondary | ICD-10-CM | POA: Diagnosis present

## 2019-03-20 DIAGNOSIS — Z955 Presence of coronary angioplasty implant and graft: Secondary | ICD-10-CM | POA: Diagnosis not present

## 2019-03-20 DIAGNOSIS — Z7901 Long term (current) use of anticoagulants: Secondary | ICD-10-CM

## 2019-03-20 DIAGNOSIS — R0489 Hemorrhage from other sites in respiratory passages: Secondary | ICD-10-CM | POA: Diagnosis present

## 2019-03-20 DIAGNOSIS — Z91041 Radiographic dye allergy status: Secondary | ICD-10-CM

## 2019-03-20 DIAGNOSIS — I2583 Coronary atherosclerosis due to lipid rich plaque: Secondary | ICD-10-CM | POA: Diagnosis not present

## 2019-03-20 DIAGNOSIS — Z8249 Family history of ischemic heart disease and other diseases of the circulatory system: Secondary | ICD-10-CM

## 2019-03-20 DIAGNOSIS — Z86711 Personal history of pulmonary embolism: Secondary | ICD-10-CM

## 2019-03-20 LAB — BASIC METABOLIC PANEL
Anion gap: 10 (ref 5–15)
BUN: 18 mg/dL (ref 8–23)
CO2: 27 mmol/L (ref 22–32)
Calcium: 8.7 mg/dL — ABNORMAL LOW (ref 8.9–10.3)
Chloride: 98 mmol/L (ref 98–111)
Creatinine, Ser: 1.21 mg/dL (ref 0.61–1.24)
GFR calc Af Amer: >60 mL/min (ref >60)
GFR calc non Af Amer: 58 mL/min — ABNORMAL LOW (ref >60)
Glucose, Bld: 137 mg/dL — ABNORMAL HIGH (ref 70–99)
Potassium: 4.5 mmol/L (ref 3.5–5.1)
Sodium: 135 mmol/L (ref 135–145)

## 2019-03-20 LAB — HEPATIC FUNCTION PANEL
ALT: 15 U/L (ref 0–44)
AST: 22 U/L (ref 15–41)
Albumin: 3.1 g/dL — ABNORMAL LOW (ref 3.5–5.0)
Alkaline Phosphatase: 55 U/L (ref 38–126)
Bilirubin, Direct: 0.2 mg/dL (ref 0.0–0.2)
Indirect Bilirubin: 0.8 mg/dL (ref 0.3–0.9)
Total Bilirubin: 1 mg/dL (ref 0.3–1.2)
Total Protein: 7.7 g/dL (ref 6.5–8.1)

## 2019-03-20 LAB — SARS CORONAVIRUS 2 (TAT 6-24 HRS): SARS Coronavirus 2: NEGATIVE

## 2019-03-20 LAB — TROPONIN I (HIGH SENSITIVITY)
Troponin I (High Sensitivity): 14 ng/L (ref <18)
Troponin I (High Sensitivity): 15 ng/L (ref <18)

## 2019-03-20 LAB — EXPECTORATED SPUTUM ASSESSMENT W GRAM STAIN, RFLX TO RESP C

## 2019-03-20 LAB — CBC
HCT: 42 % (ref 39.0–52.0)
Hemoglobin: 14.1 g/dL (ref 13.0–17.0)
MCH: 32 pg (ref 26.0–34.0)
MCHC: 33.6 g/dL (ref 30.0–36.0)
MCV: 95.5 fL (ref 80.0–100.0)
Platelets: 315 10*3/uL (ref 150–400)
RBC: 4.4 MIL/uL (ref 4.22–5.81)
RDW: 14.2 % (ref 11.5–15.5)
WBC: 7.5 10*3/uL (ref 4.0–10.5)
nRBC: 0 % (ref 0.0–0.2)

## 2019-03-20 LAB — POC SARS CORONAVIRUS 2 AG: SARS Coronavirus 2 Ag: NEGATIVE

## 2019-03-20 LAB — BRAIN NATRIURETIC PEPTIDE: B Natriuretic Peptide: 119 pg/mL — ABNORMAL HIGH (ref 0.0–100.0)

## 2019-03-20 MED ORDER — IPRATROPIUM BROMIDE HFA 17 MCG/ACT IN AERS: 2.0000 | INHALATION_SPRAY | RESPIRATORY_TRACT | Status: DC

## 2019-03-20 MED ORDER — SODIUM CHLORIDE 0.9 % IV SOLN
500.0000 mg | Freq: Every day | INTRAVENOUS | Status: DC
  Administered 2019-03-20 – 2019-03-22 (×3): 500 mg via INTRAVENOUS
  Filled 2019-03-20 (×4): qty 500

## 2019-03-20 MED ORDER — SODIUM CHLORIDE 0.9 % IV SOLN
INTRAVENOUS | Status: DC | PRN
  Administered 2019-03-20 – 2019-03-23 (×3): 250 mL via INTRAVENOUS

## 2019-03-20 MED ORDER — SODIUM CHLORIDE 0.9 % IV SOLN
2.0000 g | Freq: Every day | INTRAVENOUS | Status: DC
  Administered 2019-03-20 – 2019-03-23 (×4): 2 g via INTRAVENOUS
  Filled 2019-03-20 (×2): qty 20
  Filled 2019-03-20 (×2): qty 2

## 2019-03-20 MED ORDER — ISOSORBIDE MONONITRATE ER 30 MG PO TB24
60.0000 mg | ORAL_TABLET | Freq: Two times a day (BID) | ORAL | Status: DC
  Administered 2019-03-20 – 2019-03-22 (×4): 60 mg via ORAL
  Filled 2019-03-20 (×4): qty 2

## 2019-03-20 MED ORDER — ACETAMINOPHEN 325 MG PO TABS
650.0000 mg | ORAL_TABLET | Freq: Four times a day (QID) | ORAL | Status: DC | PRN
  Administered 2019-03-20 – 2019-03-22 (×2): 650 mg via ORAL
  Filled 2019-03-20 (×2): qty 2

## 2019-03-20 MED ORDER — ATORVASTATIN CALCIUM 20 MG PO TABS
80.0000 mg | ORAL_TABLET | Freq: Every day | ORAL | Status: DC
  Administered 2019-03-21 – 2019-03-24 (×4): 80 mg via ORAL
  Filled 2019-03-20 (×4): qty 4

## 2019-03-20 MED ORDER — METOPROLOL SUCCINATE ER 25 MG PO TB24
25.0000 mg | ORAL_TABLET | Freq: Every evening | ORAL | Status: DC
  Administered 2019-03-20 – 2019-03-21 (×2): 25 mg via ORAL
  Filled 2019-03-20 (×3): qty 1

## 2019-03-20 MED ORDER — EZETIMIBE 10 MG PO TABS
10.0000 mg | ORAL_TABLET | Freq: Every day | ORAL | Status: DC
  Administered 2019-03-21 – 2019-03-25 (×5): 10 mg via ORAL
  Filled 2019-03-20 (×5): qty 1

## 2019-03-20 MED ORDER — IPRATROPIUM BROMIDE 0.02 % IN SOLN
0.5000 mg | RESPIRATORY_TRACT | Status: DC
  Administered 2019-03-20 – 2019-03-21 (×3): 0.5 mg via RESPIRATORY_TRACT
  Filled 2019-03-20 (×3): qty 2.5

## 2019-03-20 MED ORDER — DM-GUAIFENESIN ER 30-600 MG PO TB12
1.0000 | ORAL_TABLET | Freq: Two times a day (BID) | ORAL | Status: DC
  Administered 2019-03-20 – 2019-03-24 (×8): 1 via ORAL
  Filled 2019-03-20 (×8): qty 1

## 2019-03-20 MED ORDER — HYDRALAZINE HCL 25 MG PO TABS: 25.0000 mg | ORAL_TABLET | Freq: Three times a day (TID) | ORAL | Status: DC | PRN

## 2019-03-20 MED ORDER — ALBUTEROL SULFATE (2.5 MG/3ML) 0.083% IN NEBU: 2.5000 mg | INHALATION_SOLUTION | RESPIRATORY_TRACT | Status: DC | PRN

## 2019-03-20 MED ORDER — RANOLAZINE ER 500 MG PO TB12
1000.0000 mg | ORAL_TABLET | Freq: Two times a day (BID) | ORAL | Status: DC
  Administered 2019-03-20 – 2019-03-22 (×4): 1000 mg via ORAL
  Filled 2019-03-20 (×5): qty 2

## 2019-03-20 MED ORDER — APIXABAN 2.5 MG PO TABS
2.5000 mg | ORAL_TABLET | Freq: Two times a day (BID) | ORAL | Status: DC
  Administered 2019-03-20 – 2019-03-21 (×2): 2.5 mg via ORAL
  Filled 2019-03-20 (×3): qty 1

## 2019-03-20 MED ORDER — ASCORBIC ACID 500 MG PO TABS
500.0000 mg | ORAL_TABLET | Freq: Every day | ORAL | Status: DC
  Administered 2019-03-20 – 2019-03-24 (×5): 500 mg via ORAL
  Filled 2019-03-20 (×5): qty 1

## 2019-03-20 MED ORDER — SODIUM CHLORIDE 0.9% FLUSH: 3.0000 mL | Freq: Once | INTRAVENOUS | Status: DC

## 2019-03-20 MED ORDER — ZINC SULFATE 220 (50 ZN) MG PO CAPS
220.0000 mg | ORAL_CAPSULE | Freq: Every day | ORAL | Status: DC
  Administered 2019-03-20 – 2019-03-24 (×5): 220 mg via ORAL
  Filled 2019-03-20 (×5): qty 1

## 2019-03-20 MED ORDER — SODIUM CHLORIDE 0.9 % IV SOLN
100.0000 mg | Freq: Every day | INTRAVENOUS | Status: DC
  Filled 2019-03-20: qty 20

## 2019-03-20 MED ORDER — NITROGLYCERIN 0.4 MG SL SUBL: 0.4000 mg | SUBLINGUAL_TABLET | SUBLINGUAL | Status: DC | PRN

## 2019-03-20 MED ORDER — METHYLPREDNISOLONE SODIUM SUCC 40 MG IJ SOLR
40.0000 mg | Freq: Two times a day (BID) | INTRAMUSCULAR | Status: DC
  Administered 2019-03-20 – 2019-03-21 (×2): 40 mg via INTRAVENOUS
  Filled 2019-03-20 (×2): qty 1

## 2019-03-20 MED ORDER — SODIUM CHLORIDE 0.9 % IV SOLN
200.0000 mg | Freq: Once | INTRAVENOUS | Status: AC
  Administered 2019-03-20: 22:00:00 200 mg via INTRAVENOUS
  Filled 2019-03-20: qty 200

## 2019-03-20 MED ORDER — ONDANSETRON HCL 4 MG/2ML IJ SOLN: 4.0000 mg | Freq: Three times a day (TID) | INTRAMUSCULAR | Status: DC | PRN

## 2019-03-20 NOTE — Consult Note (Signed)
Remdesivir - Pharmacy Brief Note   highly suspected. pending pCR. Per Dr. Blaine Hamper. Antigen test negative.   A/P:  Remdesivir 200 mg IVPB once followed by 100 mg IVPB daily x 4 days.   Scott [name] 03/20/2019 6:08 PM

## 2019-03-20 NOTE — ED Provider Notes (Signed)
[facility] Emergency Department Provider Note  ____________________________________________  Time seen: Approximately 2:27 PM  I have reviewed the triage vital signs and the nursing notes.   HISTORY  Chief Complaint Chest Pain and Shortness of Breath    HPI Scott [name] is a 79 y.o. male with a history of CAD CHF  and PE who comes the ED complaining of shortness of breath with exertion.  He is chronically on 3 L nasal cannula, but for the past week he has noticed that he gets more short of breath whenever he walks even a short distance in his house, associated with a feeling of central chest pressure.  Yesterday he took nitroglycerin which seemed to resolve the symptoms.  Today while short of breath, he checked his oxygen saturation at home and found it to be 73% on 3 L nasal cannula.  Symptoms are better with rest.  Severe when present.    Past Medical History:  Diagnosis Date  . Arthritis    right hip, since a fall  . CAD (coronary artery disease)    a. 04/2012 Cath: 3VD->Med Rx;  b. 04/2013 PCI RCA (2 DES); c. 03/2014 PCI: LAD 80 (3.0x23 Xience Alpine DES); d. 06/2014 CABG x 2 ([facility]) LIMA->LAD, VG->OM; e. 12/2014 Cath ([facility]): patent grafts->Med Rx; f. 08/2015 Cath: patent grafts; g. 11/2016 Cath: LM min irregs, LAD 20p, 38m, LCX 100p/m, RCA 20ost, 10p/m/d, VG->OM1 nl, LIMA->dLAD nl; f. 07/2017 MV: No isch, EF 51%.  . Cardiomyopathy, ischemic    a. 04/2012 Echo: EF 40-45%;  b. 08/2013 Echo: EF 45-50%; c. 01/2015 Echo: EF 40-45%; d. 07/2016 Echo: EF 60-65%; e. 09/2017 Echo: EF 55-60%, Gr1 DD.  . Carotid arterial disease ([place])    a. 05/2013 Carotid U/S; bilat 40-50% ICA stenosis.  . Chronic Chest Pain    USES NITRO  . Chronic systolic CHF (congestive heart failure) ([place])    a. 01/2015 Echo: EF 40-45%; b. 07/2016 Echo: EF 60-65%; c. 09/2017 Echo: EF 55-60%, Gr1 DD, nl RV fxn.  . Cough   . Diverticulosis   . Dizziness   . Dyspnea    WITH EXERTION  . Headache 1970's    migraine  . History of blood transfusion 2016   post op  . Hyperlipidemia   . Hypertension   . Iron deficiency anemia due to chronic blood loss 09/11/2016  . Myocardial infarction [facility])    unsure of when  . Pulmonary emboli ([place]) 07/2016   a. On Xarelto.  . Reflux esophagitis   . Sternal pain    a. 03/2016 s/p Sternal wire removal; b. 05/2016 s/p redo median sternotomy for sternal debridement and sternal plating.  . Syncope 04/2018   CARDIOLOGIST WAS DR Rockey Situ AND HE WAS AWARE-NOW PT SEES PARASCHOS  . Tubular adenoma of colon      Patient Active Problem List   Diagnosis Date Noted  . Multifocal pneumonia 03/20/2019  . CAD (coronary artery disease) 03/20/2019  . Acute on chronic respiratory failure with hypoxia ([place])   . Postural dizziness with near syncope 01/07/2019  . NSIP (nonspecific interstitial pneumonitis) ([place]) 01/06/2019  . Weakness 01/05/2019  . Dizziness 12/07/2017  . Orthostatic hypotension 12/07/2017  . Chest pain 10/02/2017  . 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 Iron deficiency anemia due to chronic blood loss 09/11/2016  . Pulmonary embolus ([place]) 08/31/2016  . History of pulmonary embolism 08/16/2016  . 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 Panic disorder   . 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  Panic attack 08/04/2016  . 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 Coronary artery disease involving native coronary artery of native heart with angina pectoris with documented spasm ([place])   . Hx of CABG   . Ischemic cardiomyopathy   . Chronic systolic CHF (congestive heart failure) ([place])   . Chronic Chest Pain   . Coronary artery disease involving coronary bypass graft of native heart with angina pectoris with documented spasm ([place])   . Angina pectoris ([place])   . Anxiety   . Hyperlipemia   . Chronic diastolic heart failure ([place]) 12/21/2014  . Unstable angina ([place]) 12/08/2014  . PAF (paroxysmal atrial fibrillation) ([place]) 12/08/2014  . Chronic  anticoagulation 12/08/2014  . Anemia 04/20/2014  . 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 Cardiomyopathy, ischemic   . Essential hypertension   . Bilateral carotid artery disease ([place])   . S/P drug eluting coronary stent placement 05/30/2013  . Chest pain with low risk for cardiac etiology 05/05/2013  . SOB (shortness of breath) 05/05/2013  . Coronary artery disease involving coronary bypass graft of native heart with angina pectoris ([place]) 05/05/2013  . Hyperlipidemia 05/05/2013  . History of smoking 05/05/2013  . Cardiac angina ([place]) 05/05/2013     Past Surgical History:  Procedure Laterality Date  . CARDIAC CATHETERIZATION  05/01/2012  . CARDIAC CATHETERIZATION  04/2013   [facility];x3 stent  . CARDIAC CATHETERIZATION  01/11/15    [facility]  . CARDIAC CATHETERIZATION N/A 09/16/2015   Procedure: LEFT HEART CATH AND CORS/GRAFTS ANGIOGRAPHY;  Surgeon: Minna Merritts, MD;  Location: [place] [facility];  Service: Cardiovascular;  Laterality: N/A;  . CARPAL TUNNEL RELEASE     right hand  . CATARACT EXTRACTION     LEFT  . CATARACT EXTRACTION W/PHACO Left 09/16/2014   Procedure: CATARACT EXTRACTION PHACO AND INTRAOCULAR LENS PLACEMENT (IOC);  Surgeon: Leandrew Koyanagi, MD;  Location: [place];  Service: Ophthalmology;  Laterality: Left;  . COLONOSCOPY    . COLONOSCOPY WITH PROPOFOL N/A 01/28/2016   Procedure: COLONOSCOPY WITH PROPOFOL;  Surgeon: Lollie Sails, MD;  Location: [facility] ENDOSCOPY;  Service: Endoscopy;  Laterality: N/A;  . CORONARY ANGIOPLASTY WITH STENT PLACEMENT  04/13/2014  . CORONARY ARTERY BYPASS GRAFT  06-26-14   x3 bypasses  . CORONARY PRESSURE WIRE/FFR WITH 3D MAPPING N/A 12/10/2018   Procedure: Suzette Battiest PRESSURE WIRE/FFR STUDY;  Surgeon: Isaias Cowman, MD;  Location: [place] [facility];  Service: Cardiovascular;  Laterality: N/A;  . CYSTOSCOPY N/A 08/14/2018   Procedure: CYSTOSCOPY;  Surgeon: Abbie Sons, MD;  Location: [facility] ORS;  Service:  Urology;  Laterality: N/A;  . DE QUERVAIN'S RELEASE Left 08/22/2012  . ESOPHAGOGASTRODUODENOSCOPY (EGD) WITH PROPOFOL N/A 04/26/2015   Procedure: ESOPHAGOGASTRODUODENOSCOPY (EGD) WITH PROPOFOL;  Surgeon: Hulen Luster, MD;  Location: [facility] ENDOSCOPY;  Service: Gastroenterology;  Laterality: N/A;  . ESOPHAGOGASTRODUODENOSCOPY (EGD) WITH PROPOFOL N/A 05/29/2017   Procedure: ESOPHAGOGASTRODUODENOSCOPY (EGD) WITH PROPOFOL;  Surgeon: Lollie Sails, MD;  Location: [facility] ENDOSCOPY;  Service: Endoscopy;  Laterality: N/A;  . INTRAVASCULAR ULTRASOUND/IVUS N/A 12/10/2018   Procedure: Intravascular Ultrasound/IVUS;  Surgeon: Isaias Cowman, MD;  Location: [place] [facility];  Service: Cardiovascular;  Laterality: N/A;  . LEFT HEART CATH AND CORONARY ANGIOGRAPHY N/A 12/04/2016   Procedure: LEFT HEART CATH AND CORS/GRAFTS  ANGIOGRAPHY;  Surgeon: Wellington Hampshire, MD;  Location: [place] [facility];  Service: Cardiovascular;  Laterality: N/A;  . LEFT HEART CATH AND CORS/GRAFTS ANGIOGRAPHY N/A 04/09/2018   Procedure: LEFT HEART CATH AND CORS/GRAFTS ANGIOGRAPHY;  Surgeon: Isaias Cowman, MD;  Location: [place] [facility];  Service: Cardiovascular;  Laterality: N/A;  . LEFT HEART CATH AND CORS/GRAFTS ANGIOGRAPHY N/A 12/10/2018   Procedure: LEFT HEART CATH AND CORS/GRAFTS ANGIOGRAPHY;  Surgeon: Isaias Cowman, MD;  Location: [place] [facility];  Service: Cardiovascular;  Laterality: N/A;  . RIB PLATING N/A 05/25/2016   Procedure: STERNAL PLATING;  Surgeon: Melrose Nakayama, MD;  Location: [place];  Service: Thoracic;  Laterality: N/A;  . right shoulder    . STERNAL WIRES REMOVAL N/A 03/30/2016   Procedure: STERNAL WIRES REMOVAL;  Surgeon: Melrose Nakayama, MD;  Location: [place];  Service: Thoracic;  Laterality: N/A;  . TONSILLECTOMY       Prior to Admission medications   Medication Sig Start Date End Date Taking? Authorizing Provider  atorvastatin (LIPITOR) 80 MG tablet Take 1 tablet (80 mg  total) by mouth daily at 6 PM. 10/04/17  Yes Gouru, Aruna, MD  ezetimibe (ZETIA) 10 MG tablet Take 1 tablet (10 mg total) by mouth daily. 12/07/17  Yes Minna Merritts, MD  ipratropium (ATROVENT) 0.06 % nasal spray Place 2 sprays into both nostrils 3 (three) times daily as needed for rhinitis or allergies. 12/31/18  Yes [provider]  isosorbide mononitrate (IMDUR) 60 MG 24 hr tablet Take 60 mg by mouth 2 (two) times daily.    Yes [provider]  metoprolol succinate (TOPROL-XL) 25 MG 24 hr tablet Take 25 mg by mouth every evening. 03/18/19  Yes [provider]  nitroGLYCERIN (NITROSTAT) 0.4 MG SL tablet Place 1 tablet (0.4 mg total) under the tongue every 5 (five) minutes as needed for chest pain. 12/07/17  Yes Minna Merritts, MD  ranolazine (RANEXA) 1000 MG SR tablet Take 1 tablet (1,000 mg total) by mouth 2 (two) times daily. 12/07/17  Yes Gollan, [name], MD  sulfamethoxazole-trimethoprim (BACTRIM) 400-80 MG tablet Take 1 tablet by mouth 2 (two) times daily. 03/11/19 03/25/19 Yes [provider]  ELIQUIS 2.5 MG TABS tablet Take 2.5 mg by mouth 2 (two) times daily. 03/19/19   [provider]     Allergies Contrast media [iodinated diagnostic agents], Metrizamide, Oxycodone hcl, and Vicodin [hydrocodone-acetaminophen]   Family History  Problem Relation Age of Onset  . Heart attack Father 1       MI  . Cancer Maternal Uncle     Social History Social History   Tobacco Use  . Smoking status: Former Smoker    Packs/day: 1.00    Years: 35.00    Pack years: 35.00    Types: Cigarettes    Start date: 05/02/2009  . Smokeless tobacco: Former Network engineer Use Topics  . Alcohol use: No  . Drug use: No    Review of Systems  Constitutional:   No fever or chills.  ENT:   No sore throat. No rhinorrhea. Cardiovascular:   Positive chest pain as above without syncope. Respiratory:   Positive shortness of breath without  cough. Gastrointestinal:   Negative for abdominal pain, vomiting and diarrhea.  Musculoskeletal:   Negative for focal pain or swelling All other systems reviewed and are negative except as documented above in ROS and HPI.  ____________________________________________   PHYSICAL EXAM:  VITAL SIGNS: ED Triage Vitals  Enc Vitals Group     BP 03/20/19 1053 (!) 125/57     Pulse Rate 03/20/19 1104 77     Resp 03/20/19 1053 (!) 81     Temp 03/20/19 1053 99.7 F (  37.6 C)     Temp Source 03/20/19 1053 Oral     SpO2 03/20/19 1053 95 %     Weight 03/20/19 1051 168 lb (76.2 kg)     Height 03/20/19 1051 5\' 10"  (1.778 m)     Head Circumference --      Peak Flow --      Pain Score 03/20/19 1050 4     Pain Loc --      Pain Edu? --      Excl. in [place]? --     Vital signs reviewed, nursing assessments reviewed.   Constitutional:   Alert and oriented. Non-toxic appearance. Eyes:   Conjunctivae are normal. EOMI. PERRL. ENT      Head:   Normocephalic and atraumatic.      Nose:   Wearing a mask.      Mouth/Throat:   Wearing a mask.      Neck:   No meningismus. Full ROM. Hematological/Lymphatic/Immunilogical:   No cervical lymphadenopathy. Cardiovascular:   RRR. Symmetric bilateral radial and DP pulses.  No murmurs. Cap refill less than 2 seconds. Respiratory:   Normal respiratory effort without tachypnea/retractions. Breath sounds are clear and equal bilaterally. No wheezes/rales/rhonchi. Gastrointestinal:   Soft and nontender. Non distended. There is no CVA tenderness.  No rebound, rigidity, or guarding.  Musculoskeletal:   Normal range of motion in all extremities. No joint effusions.  No lower extremity tenderness.  No edema.  Chest wall nontender Neurologic:   Normal speech and language.  Motor grossly intact. No acute focal neurologic deficits are appreciated.  Skin:    Skin is warm, dry and intact. No rash noted.  No petechiae, purpura, or  bullae.  ____________________________________________    LABS (pertinent positives/negatives) (all labs ordered are listed, but only abnormal results are displayed) Labs Reviewed  BASIC METABOLIC PANEL - Abnormal; Notable for the following components:      Result Value   Glucose, Bld 137 (*)    Calcium 8.7 (*)    GFR calc non Af Amer 58 (*)    All other components within normal limits  BRAIN NATRIURETIC PEPTIDE - Abnormal; Notable for the following components:   B Natriuretic Peptide 119.0 (*)    All other components within normal limits  SARS CORONAVIRUS 2 (TAT 6-24 HRS)  CULTURE, BLOOD (ROUTINE X 2)  CULTURE, BLOOD (ROUTINE X 2)  CBC  POC SARS CORONAVIRUS 2 AG  POC SARS CORONAVIRUS 2 AG -  ED  TROPONIN I (HIGH SENSITIVITY)  TROPONIN I (HIGH SENSITIVITY)   ____________________________________________   EKG  Interpreted by me Normal sinus rhythm rate of 78, left axis, normal intervals.  Left bundle branch block.  No acute ischemic changes.  ____________________________________________    [id number]  DG Chest Port 1 View  Result Date: 03/20/2019 CLINICAL DATA:  Chest pain and shortness of breath. EXAM: PORTABLE CHEST 1 VIEW COMPARISON:  01/05/2019 FINDINGS: 11:19 a.m. Low volume film. Patchy peripheral airspace opacity noted bilaterally, right greater than left. Cardiopericardial silhouette is at upper limits of normal for size. No substantial pleural effusion. Sternal fusion hardware evident. Telemetry leads overlie the chest. IMPRESSION: Right greater than left patchy bilateral ground-glass airspace disease, peripherally predominant. Imaging features suggest multifocal pneumonia and atypical etiology would be a consideration. Electronically Signed   By: Misty Stanley M.D.   On: 03/20/2019 11:34    ____________________________________________   PROCEDURES .Critical Care Performed by: Carrie Mew, MD Authorized by: Carrie Mew, MD   Critical care provider  statement:  Critical care time (minutes):  35   Critical care time was exclusive of:  Separately billable procedures and treating other patients   Critical care was necessary to treat or prevent imminent or life-threatening deterioration of the following conditions:  Respiratory failure   Critical care was time spent personally by me on the following activities:  Development of treatment plan with patient or surrogate, discussions with consultants, evaluation of patient's response to treatment, examination of patient, obtaining history from patient or surrogate, ordering and performing treatments and interventions, ordering and review of laboratory studies, ordering and review of radiographic studies, pulse oximetry, re-evaluation of patient's condition and review of old charts    ____________________________________________  DIFFERENTIAL DIAGNOSIS   Non-STEMI, pneumonia, COVID-19, pulmonary edema, pleural effusion, pneumothorax, unlikely PE  CLINICAL IMPRESSION / ASSESSMENT AND PLAN / ED COURSE  Medications ordered in the ED: Medications  sodium chloride flush (NS) 0.9 % injection 3 mL (3 mLs Intravenous Not Given 03/20/19 1114)  cefTRIAXone (ROCEPHIN) 2 g in sodium chloride 0.9 % 100 mL IVPB (0 g Intravenous Stopped 03/20/19 1309)  azithromycin (ZITHROMAX) 500 mg in sodium chloride 0.9 % 250 mL IVPB (500 mg Intravenous New Bag/Given 03/20/19 1324)  albuterol (PROVENTIL) (2.5 MG/3ML) 0.083% nebulizer solution 2.5 mg (has no administration in time range)  dextromethorphan-guaiFENesin (MUCINEX DM) 30-600 MG per 12 hr tablet 1 tablet (has no administration in time range)  ipratropium (ATROVENT) nebulizer solution 0.5 mg (has no administration in time range)    Pertinent labs & imaging results that were available during my care of the patient were reviewed by me and considered in my medical decision making (see chart for details).  CHANS EWERT was evaluated in Emergency Department on  03/20/2019 for the symptoms described in the history of present illness. He was evaluated in the context of the global COVID-19 pandemic, which necessitated consideration that the patient might be at risk for infection with the SARS-CoV-2 virus that causes COVID-19. Institutional protocols and algorithms that pertain to the evaluation of patients at risk for COVID-19 are in a state of rapid change based on information released by regulatory bodies including the CDC and federal and state organizations. These policies and algorithms were followed during the patient's care in the ED.   Based on initial assessment patient is not septic.  Clinical Course as of Mar 19 1425  Thu Mar 20, 2019  1132 Patient presents with intermittent chest pain and shortness of breath, worse with exertion, better with rest.  Noted walking pulse ox at home to be 73% today on his usual 3 L nasal cannula.  He got his first Covid vaccine yesterday.  Will check labs, chest x-ray, Covid testing, will need to admit.   [PS]    Clinical Course User Index [PS] Carrie Mew, MD     ----------------------------------------- 2:30 PM on 03/20/2019 -----------------------------------------  Rapid Covid antigen test negative.  Chest x-ray shows multifocal pneumonia.  Start ceftriaxone and azithromycin.  Covid PCR ordered.  Plan to admit.  Maintains oxygen saturation in the 90s on his chronic 3 L nasal cannula at rest.  I still doubt sepsis at present time.  ____________________________________________   FINAL CLINICAL IMPRESSION(S) / ED DIAGNOSES    Final diagnoses:  Acute on chronic respiratory failure with hypoxia [facility])  Multifocal pneumonia     ED Discharge Orders    None      Portions of this note were generated with dragon dictation software. Dictation errors may occur despite best attempts at proofreading.  Carrie Mew, MD 03/20/19 [phone]

## 2019-03-20 NOTE — ED Notes (Signed)
ED TO INPATIENT HANDOFF REPORT  ED Nurse Name and Phone #: Karena Addison [phone]  S Name/Age/Gender Scott [name] 79 y.o. male Room/Bed: ED13A/ED13A  Code Status   Code Status: Prior  Home/SNF/Other Home Patient oriented to: self, place, time and situation Is this baseline? Yes   Triage Complete: Triage complete  Chief Complaint chest pain  Triage Note Chest and SOB for about 1 week    States having increased SOB with movement  Low grade fever on arrival   Also having prod cough    Allergies Allergies  Allergen Reactions  . Contrast Media [Iodinated Diagnostic Agents] Hives  . Metrizamide Hives  . Oxycodone Hcl Itching  . Vicodin [Hydrocodone-Acetaminophen] Itching    Level of Care/Admitting Diagnosis ED Disposition    ED Disposition Condition Comment   Admit        B Medical/Surgery History Past Medical History:  Diagnosis Date  . Arthritis    right hip, since a fall  . CAD (coronary artery disease)    a. 04/2012 Cath: 3VD->Med Rx;  b. 04/2013 PCI RCA (2 DES); c. 03/2014 PCI: LAD 80 (3.0x23 Xience Alpine DES); d. 06/2014 CABG x 2 ([facility]) LIMA->LAD, VG->OM; e. 12/2014 Cath ([facility]): patent grafts->Med Rx; f. 08/2015 Cath: patent grafts; g. 11/2016 Cath: LM min irregs, LAD 20p, 71m, LCX 100p/m, RCA 20ost, 10p/m/d, VG->OM1 nl, LIMA->dLAD nl; f. 07/2017 MV: No isch, EF 51%.  . Cardiomyopathy, ischemic    a. 04/2012 Echo: EF 40-45%;  b. 08/2013 Echo: EF 45-50%; c. 01/2015 Echo: EF 40-45%; d. 07/2016 Echo: EF 60-65%; e. 09/2017 Echo: EF 55-60%, Gr1 DD.  . Carotid arterial disease ([place])    a. 05/2013 Carotid U/S; bilat 40-50% ICA stenosis.  . Chronic Chest Pain    USES NITRO  . Chronic systolic CHF (congestive heart failure) ([place])    a. 01/2015 Echo: EF 40-45%; b. 07/2016 Echo: EF 60-65%; c. 09/2017 Echo: EF 55-60%, Gr1 DD, nl RV fxn.  . Cough   . Diverticulosis   . Dizziness   . Dyspnea    WITH EXERTION  . Headache 1970's   migraine  . History of blood transfusion 2016   post op  .  Hyperlipidemia   . Hypertension   . Iron deficiency anemia due to chronic blood loss 09/11/2016  . Myocardial infarction [facility])    unsure of when  . Pulmonary emboli ([place]) 07/2016   a. On Xarelto.  . Reflux esophagitis   . Sternal pain    a. 03/2016 s/p Sternal wire removal; b. 05/2016 s/p redo median sternotomy for sternal debridement and sternal plating.  . Syncope 04/2018   CARDIOLOGIST WAS DR Rockey Situ AND HE WAS AWARE-NOW PT SEES PARASCHOS  . Tubular adenoma of colon    Past Surgical History:  Procedure Laterality Date  . CARDIAC CATHETERIZATION  05/01/2012  . CARDIAC CATHETERIZATION  04/2013   [facility];x3 stent  . CARDIAC CATHETERIZATION  01/11/15    [facility]  . CARDIAC CATHETERIZATION N/A 09/16/2015   Procedure: LEFT HEART CATH AND CORS/GRAFTS ANGIOGRAPHY;  Surgeon: Minna Merritts, MD;  Location: [place] [facility];  Service: Cardiovascular;  Laterality: N/A;  . CARPAL TUNNEL RELEASE     right hand  . CATARACT EXTRACTION     LEFT  . CATARACT EXTRACTION W/PHACO Left 09/16/2014   Procedure: CATARACT EXTRACTION PHACO AND INTRAOCULAR LENS PLACEMENT (IOC);  Surgeon: Leandrew Koyanagi, MD;  Location: [place];  Service: Ophthalmology;  Laterality: Left;  . COLONOSCOPY    . COLONOSCOPY WITH  PROPOFOL N/A 01/28/2016   Procedure: COLONOSCOPY WITH PROPOFOL;  Surgeon: Lollie Sails, MD;  Location: [facility] ENDOSCOPY;  Service: Endoscopy;  Laterality: N/A;  . CORONARY ANGIOPLASTY WITH STENT PLACEMENT  04/13/2014  . CORONARY ARTERY BYPASS GRAFT  06-26-14   x3 bypasses  . CORONARY PRESSURE WIRE/FFR WITH 3D MAPPING N/A 12/10/2018   Procedure: Suzette Battiest PRESSURE WIRE/FFR STUDY;  Surgeon: Isaias Cowman, MD;  Location: [place] [facility];  Service: Cardiovascular;  Laterality: N/A;  . CYSTOSCOPY N/A 08/14/2018   Procedure: CYSTOSCOPY;  Surgeon: Abbie Sons, MD;  Location: [facility] ORS;  Service: Urology;  Laterality: N/A;  . DE QUERVAIN'S RELEASE Left 08/22/2012  .  ESOPHAGOGASTRODUODENOSCOPY (EGD) WITH PROPOFOL N/A 04/26/2015   Procedure: ESOPHAGOGASTRODUODENOSCOPY (EGD) WITH PROPOFOL;  Surgeon: Hulen Luster, MD;  Location: [facility] ENDOSCOPY;  Service: Gastroenterology;  Laterality: N/A;  . ESOPHAGOGASTRODUODENOSCOPY (EGD) WITH PROPOFOL N/A 05/29/2017   Procedure: ESOPHAGOGASTRODUODENOSCOPY (EGD) WITH PROPOFOL;  Surgeon: Lollie Sails, MD;  Location: [facility] ENDOSCOPY;  Service: Endoscopy;  Laterality: N/A;  . INTRAVASCULAR ULTRASOUND/IVUS N/A 12/10/2018   Procedure: Intravascular Ultrasound/IVUS;  Surgeon: Isaias Cowman, MD;  Location: [place] [facility];  Service: Cardiovascular;  Laterality: N/A;  . LEFT HEART CATH AND CORONARY ANGIOGRAPHY N/A 12/04/2016   Procedure: LEFT HEART CATH AND CORS/GRAFTS  ANGIOGRAPHY;  Surgeon: Wellington Hampshire, MD;  Location: [place] [facility];  Service: Cardiovascular;  Laterality: N/A;  . LEFT HEART CATH AND CORS/GRAFTS ANGIOGRAPHY N/A 04/09/2018   Procedure: LEFT HEART CATH AND CORS/GRAFTS ANGIOGRAPHY;  Surgeon: Isaias Cowman, MD;  Location: [place] [facility];  Service: Cardiovascular;  Laterality: N/A;  . LEFT HEART CATH AND CORS/GRAFTS ANGIOGRAPHY N/A 12/10/2018   Procedure: LEFT HEART CATH AND CORS/GRAFTS ANGIOGRAPHY;  Surgeon: Isaias Cowman, MD;  Location: [place] [facility];  Service: Cardiovascular;  Laterality: N/A;  . RIB PLATING N/A 05/25/2016   Procedure: STERNAL PLATING;  Surgeon: Melrose Nakayama, MD;  Location: [place];  Service: Thoracic;  Laterality: N/A;  . right shoulder    . STERNAL WIRES REMOVAL N/A 03/30/2016   Procedure: STERNAL WIRES REMOVAL;  Surgeon: Melrose Nakayama, MD;  Location: [place];  Service: Thoracic;  Laterality: N/A;  . TONSILLECTOMY       A IV Location/Drains/Wounds Patient Lines/Drains/Airways Status   Active Line/Drains/Airways    Name:   Placement date:   Placement time:   Site:   Days:   Peripheral IV 03/20/19 Left Antecubital   03/20/19    1114     Antecubital   less than 1   Airway   08/14/18    1547     218   Incision (Closed) 08/14/18 Penis Other (Comment)   08/14/18    1609     218          Intake/Output Last 24 hours No intake or output data in the 24 hours ending 03/20/19 1318  Labs/Imaging Results for orders placed or performed during the hospital encounter of 03/20/19 (from the past 48 hour(s))  Basic metabolic panel     Status: Abnormal   Collection Time: 03/20/19 11:05 AM  Result Value Ref Range   Sodium 135 135 - 145 mmol/L   Potassium 4.5 3.5 - 5.1 mmol/L   Chloride 98 98 - 111 mmol/L   CO2 27 22 - 32 mmol/L   Glucose, Bld 137 (H) 70 - 99 mg/dL    Comment: Glucose reference range applies only to samples taken after fasting for at least 8 hours.  BUN 18 8 - 23 mg/dL   Creatinine, Ser 1.21 0.61 - 1.24 mg/dL   Calcium 8.7 (L) 8.9 - 10.3 mg/dL   GFR calc non Af Amer 58 (L) >60 mL/min   GFR calc Af Amer >60 >60 mL/min   Anion gap 10 5 - 15    Comment: Performed at [facility], [address] [place], [place] [address]  CBC     Status: None   Collection Time: 03/20/19 11:05 AM  Result Value Ref Range   WBC 7.5 4.0 - 10.5 K/uL   RBC 4.40 4.22 - 5.81 MIL/uL   Hemoglobin 14.1 13.0 - 17.0 g/dL   HCT 42.0 39.0 - 52.0 %   MCV 95.5 80.0 - 100.0 fL   MCH 32.0 26.0 - 34.0 pg   MCHC 33.6 30.0 - 36.0 g/dL   RDW 14.2 11.5 - 15.5 %   Platelets 315 150 - 400 K/uL   nRBC 0.0 0.0 - 0.2 %    Comment: Performed at [facility], [place], [place] [address]  Troponin I (High Sensitivity)     Status: None   Collection Time: 03/20/19 11:05 AM  Result Value Ref Range   Troponin I (High Sensitivity) 14 <18 ng/L    Comment: (NOTE) Elevated high sensitivity troponin I (hsTnI) values and significant  changes across serial measurements may suggest ACS but many other  chronic and acute conditions are known to elevate hsTnI results.  Refer to the "Links" section for chest pain algorithms and  additional  guidance. Performed at [facility], [address] [place], [place] [id number]   POC SARS Coronavirus 2 Ag     Status: None   Collection Time: 03/20/19 12:23 PM  Result Value Ref Range   SARS Coronavirus 2 Ag NEGATIVE NEGATIVE    Comment: (NOTE) SARS-CoV-2 antigen NOT DETECTED.  Negative results are presumptive.  Negative results do not preclude SARS-CoV-2 infection and should not be used as the sole basis for treatment or other patient management decisions, including infection  control decisions, particularly in the presence of clinical signs and  symptoms consistent with COVID-19, or in those who have been in contact with the virus.  Negative results must be combined with clinical observations, patient history, and epidemiological information. The expected result is Negative. Fact Sheet for Patients: PodPark.tn Fact Sheet for Healthcare Providers: GiftContent.is This test is not yet approved or cleared by the [place] FDA and  has been authorized for detection and/or diagnosis of SARS-CoV-2 by FDA under an Emergency Use Authorization (EUA).  This EUA will remain in effect (meaning this test can be used) for the duration of  the COVID-19 de claration under Section 564(b)(1) of the Act, 21 U.S.C. section 360bbb-3(b)(1), unless the authorization is terminated or revoked sooner.    DG Chest Port 1 View  Result Date: 03/20/2019 CLINICAL DATA:  Chest pain and shortness of breath. EXAM: PORTABLE CHEST 1 VIEW COMPARISON:  01/05/2019 FINDINGS: 11:19 a.m. Low volume film. Patchy peripheral airspace opacity noted bilaterally, right greater than left. Cardiopericardial silhouette is at upper limits of normal for size. No substantial pleural effusion. Sternal fusion hardware evident. Telemetry leads overlie the chest. IMPRESSION: Right greater than left patchy bilateral ground-glass airspace disease,  peripherally predominant. Imaging features suggest multifocal pneumonia and atypical etiology would be a consideration. Electronically Signed   By: Misty Stanley M.D.   On: 03/20/2019 11:34    Pending Labs Unresulted Labs (From admission, onward)  Start     Ordered   03/20/19 1312  CULTURE, BLOOD (ROUTINE X 2) w Reflex to ID Panel  BLOOD CULTURE X 2,   STAT     03/20/19 1311   03/20/19 1312  Brain natriuretic peptide  Add-on,   AD     03/20/19 1311   03/20/19 1242  SARS CORONAVIRUS 2 (TAT 6-24 HRS) Nasopharyngeal Nasopharyngeal Swab  (Tier 3 (TAT 6-24 hrs))  ONCE - STAT,   STAT    Question Answer Comment  Is this test for diagnosis or screening Diagnosis of ill patient   Symptomatic for COVID-19 as defined by CDC Yes   Date of Symptom Onset 03/17/2019   Hospitalized for COVID-19 Yes   Admitted to ICU for COVID-19 No   Previously tested for COVID-19 Yes   Resident in a congregate (group) care setting No   Employed in healthcare setting No      03/20/19 1241          Vitals/Pain Today's Vitals   03/20/19 1051 03/20/19 1053 03/20/19 1104 03/20/19 1117  BP:  (!) 125/57 130/67   Pulse:   77   Resp:  (!) 81 (!) 35   Temp:  99.7 F (37.6 C)    TempSrc:  Oral    SpO2:  95% 97%   Weight: 76.2 kg     Height: 5\' 10"  (1.778 m)     PainSc:    3     Isolation Precautions Airborne and Contact precautions  Medications Medications  sodium chloride flush (NS) 0.9 % injection 3 mL (3 mLs Intravenous Not Given 03/20/19 1114)  cefTRIAXone (ROCEPHIN) 2 g in sodium chloride 0.9 % 100 mL IVPB (0 g Intravenous Stopped 03/20/19 1309)  azithromycin (ZITHROMAX) 500 mg in sodium chloride 0.9 % 250 mL IVPB (has no administration in time range)  ipratropium (ATROVENT HFA) inhaler 2 puff (has no administration in time range)  albuterol (VENTOLIN HFA) 108 (90 Base) MCG/ACT inhaler 2 puff (has no administration in time range)  dextromethorphan-guaiFENesin (MUCINEX DM) 30-600 MG per 12 hr tablet 1  tablet (has no administration in time range)    Mobility walks Low fall risk   Focused Assessments    R Recommendations: See Admitting Provider Note  Report given to:

## 2019-03-20 NOTE — ED Triage Notes (Addendum)
Chest and SOB for about 1 week    States having increased SOB with movement  Low grade fever on arrival   Also having prod cough

## 2019-03-20 NOTE — H&P (Signed)
History and Physical    Scott [name] [id number] DOB: Apr 07, 1940 DOA: 03/20/2019  Referring MD/NP/PA:   PCP: Baxter Hire, MD   Patient coming from:  The patient is coming from home.  At baseline, pt is independent for most of ADL.        Chief Complaint: SOB  HPI: Scott [name] is a 79 y.o. male with medical history significant of hypertension, hyperlipidemia, PE on Xarelto, CAD, CABG, stent placement, dCHF, PAF, GERD, who presents with shortness breath.  Patient states that he has been having shortness of breath intermittently for about 1 week.  He has low grade fever and chills.  He has productive cough.  He also reports some intermittent moderate chest pain on coughing.  He states that occasionally he has little nosebleeding on heavy coughing.  Patient has 1 time of loose stool bowel movement today. He has nausea, but no vomiting or abdominal pain.  No symptoms of UTI or unilateral weakness. Of note, patient is on 3 L nasal cannula oxygen at home, he had oxygen desaturation to 73% on home 3 L oxygen at one time.  Currently oxygen saturation 95-97% on 3 L nasal cannula oxygen.  ED Course: pt was found to have Covid Ag test negative, pending PCR for Covid, troponin 14, WBC 7.5, creatinine 1.21, BUN 18, GFR >60, temperature 99.7, blood pressure 130/67, heart rate 77, tachypnea, oxygen saturation 95% on 3 L nasal cannula oxygen.  Chest x-ray showed bilateral multifocal infiltration.  Patient is admitted to telemetry bed as inpatient.  Review of Systems:   General: has fevers, chills, no body weight gain, has fatigue HEENT: no blurry vision, hearing changes or sore throat Respiratory: has dyspnea, coughing, no wheezing CV: has chest pain, no palpitations GI: has nausea,  diarrhea, no constipation, vomiting, abdominal pain, GU: no dysuria, burning on urination, increased urinary frequency, hematuria  Ext: no leg edema Neuro: no unilateral weakness, numbness, or tingling, no vision  change or hearing loss Skin: no rash, no skin tear. MSK: No muscle spasm, no deformity, no limitation of range of movement in spin Heme: No easy bruising.  Travel history: No recent long distant travel.  Allergy:  Allergies  Allergen Reactions  . Contrast Media [Iodinated Diagnostic Agents] Hives  . Metrizamide Hives  . Oxycodone Hcl Itching  . Vicodin [Hydrocodone-Acetaminophen] Itching    Past Medical History:  Diagnosis Date  . Arthritis    right hip, since a fall  . CAD (coronary artery disease)    a. 04/2012 Cath: 3VD->Med Rx;  b. 04/2013 PCI RCA (2 DES); c. 03/2014 PCI: LAD 80 (3.0x23 Xience Alpine DES); d. 06/2014 CABG x 2 ([facility]) LIMA->LAD, VG->OM; e. 12/2014 Cath ([facility]): patent grafts->Med Rx; f. 08/2015 Cath: patent grafts; g. 11/2016 Cath: LM min irregs, LAD 20p, 77m, LCX 100p/m, RCA 20ost, 10p/m/d, VG->OM1 nl, LIMA->dLAD nl; f. 07/2017 MV: No isch, EF 51%.  . Cardiomyopathy, ischemic    a. 04/2012 Echo: EF 40-45%;  b. 08/2013 Echo: EF 45-50%; c. 01/2015 Echo: EF 40-45%; d. 07/2016 Echo: EF 60-65%; e. 09/2017 Echo: EF 55-60%, Gr1 DD.  . Carotid arterial disease ([place])    a. 05/2013 Carotid U/S; bilat 40-50% ICA stenosis.  . Chronic Chest Pain    USES NITRO  . Chronic systolic CHF (congestive heart failure) ([place])    a. 01/2015 Echo: EF 40-45%; b. 07/2016 Echo: EF 60-65%; c. 09/2017 Echo: EF 55-60%, Gr1 DD, nl RV fxn.  . Cough   . Diverticulosis   .  Dizziness   . Dyspnea    WITH EXERTION  . Headache 1970's   migraine  . History of blood transfusion 2016   post op  . Hyperlipidemia   . Hypertension   . Iron deficiency anemia due to chronic blood loss 09/11/2016  . Myocardial infarction [facility])    unsure of when  . Pulmonary emboli ([place]) 07/2016   a. On Xarelto.  . Reflux esophagitis   . Sternal pain    a. 03/2016 s/p Sternal wire removal; b. 05/2016 s/p redo median sternotomy for sternal debridement and sternal plating.  . Syncope 04/2018   CARDIOLOGIST WAS DR Rockey Situ AND HE WAS  AWARE-NOW PT SEES PARASCHOS  . Tubular adenoma of colon     Past Surgical History:  Procedure Laterality Date  . CARDIAC CATHETERIZATION  05/01/2012  . CARDIAC CATHETERIZATION  04/2013   [facility];x3 stent  . CARDIAC CATHETERIZATION  01/11/15    [facility]  . CARDIAC CATHETERIZATION N/A 09/16/2015   Procedure: LEFT HEART CATH AND CORS/GRAFTS ANGIOGRAPHY;  Surgeon: Minna Merritts, MD;  Location: [place] [facility];  Service: Cardiovascular;  Laterality: N/A;  . CARPAL TUNNEL RELEASE     right hand  . CATARACT EXTRACTION     LEFT  . CATARACT EXTRACTION W/PHACO Left 09/16/2014   Procedure: CATARACT EXTRACTION PHACO AND INTRAOCULAR LENS PLACEMENT (IOC);  Surgeon: Leandrew Koyanagi, MD;  Location: [place];  Service: Ophthalmology;  Laterality: Left;  . COLONOSCOPY    . COLONOSCOPY WITH PROPOFOL N/A 01/28/2016   Procedure: COLONOSCOPY WITH PROPOFOL;  Surgeon: Lollie Sails, MD;  Location: [facility] ENDOSCOPY;  Service: Endoscopy;  Laterality: N/A;  . CORONARY ANGIOPLASTY WITH STENT PLACEMENT  04/13/2014  . CORONARY ARTERY BYPASS GRAFT  06-26-14   x3 bypasses  . CORONARY PRESSURE WIRE/FFR WITH 3D MAPPING N/A 12/10/2018   Procedure: Suzette Battiest PRESSURE WIRE/FFR STUDY;  Surgeon: Isaias Cowman, MD;  Location: [place] [facility] LAB;  Service: Cardiovascular;  Laterality: N/A;  . CYSTOSCOPY N/A 08/14/2018   Procedure: CYSTOSCOPY;  Surgeon: Abbie Sons, MD;  Location: [facility] ORS;  Service: Urology;  Laterality: N/A;  . DE QUERVAIN'S RELEASE Left 08/22/2012  . ESOPHAGOGASTRODUODENOSCOPY (EGD) WITH PROPOFOL N/A 04/26/2015   Procedure: ESOPHAGOGASTRODUODENOSCOPY (EGD) WITH PROPOFOL;  Surgeon: Hulen Luster, MD;  Location: [facility] ENDOSCOPY;  Service: Gastroenterology;  Laterality: N/A;  . ESOPHAGOGASTRODUODENOSCOPY (EGD) WITH PROPOFOL N/A 05/29/2017   Procedure: ESOPHAGOGASTRODUODENOSCOPY (EGD) WITH PROPOFOL;  Surgeon: Lollie Sails, MD;  Location: [facility] ENDOSCOPY;  Service: Endoscopy;  Laterality:  N/A;  . INTRAVASCULAR ULTRASOUND/IVUS N/A 12/10/2018   Procedure: Intravascular Ultrasound/IVUS;  Surgeon: Isaias Cowman, MD;  Location: [place] [facility];  Service: Cardiovascular;  Laterality: N/A;  . LEFT HEART CATH AND CORONARY ANGIOGRAPHY N/A 12/04/2016   Procedure: LEFT HEART CATH AND CORS/GRAFTS  ANGIOGRAPHY;  Surgeon: Wellington Hampshire, MD;  Location: [place] CV LAB;  Service: Cardiovascular;  Laterality: N/A;  . LEFT HEART CATH AND CORS/GRAFTS ANGIOGRAPHY N/A 04/09/2018   Procedure: LEFT HEART CATH AND CORS/GRAFTS ANGIOGRAPHY;  Surgeon: Isaias Cowman, MD;  Location: [place] [facility];  Service: Cardiovascular;  Laterality: N/A;  . LEFT HEART CATH AND CORS/GRAFTS ANGIOGRAPHY N/A 12/10/2018   Procedure: LEFT HEART CATH AND CORS/GRAFTS ANGIOGRAPHY;  Surgeon: Isaias Cowman, MD;  Location: [place] [facility];  Service: Cardiovascular;  Laterality: N/A;  . RIB PLATING N/A 05/25/2016   Procedure: STERNAL PLATING;  Surgeon: Melrose Nakayama, MD;  Location: [place];  Service: Thoracic;  Laterality: N/A;  . right shoulder    .  STERNAL WIRES REMOVAL N/A 03/30/2016   Procedure: STERNAL WIRES REMOVAL;  Surgeon: Melrose Nakayama, MD;  Location: [place];  Service: Thoracic;  Laterality: N/A;  . TONSILLECTOMY      Social History:  reports that he has quit smoking. His smoking use included cigarettes. He started smoking about 9 years ago. He has a 35.00 pack-year smoking history. He has quit using smokeless tobacco. He reports that he does not drink alcohol or use drugs.  Family History:  Family History  Problem Relation Age of Onset  . Heart attack Father 3       MI  . Cancer Maternal Uncle      Prior to Admission medications   Medication Sig Start Date End Date Taking? Authorizing Provider  atorvastatin (LIPITOR) 80 MG tablet Take 1 tablet (80 mg total) by mouth daily at 6 PM. 10/04/17   Gouru, Aruna, MD  ezetimibe (ZETIA) 10 MG tablet Take 1 tablet (10 mg  total) by mouth daily. 12/07/17   Minna Merritts, MD  ipratropium (ATROVENT) 0.06 % nasal spray Place 2 sprays into both nostrils 3 (three) times daily as needed for rhinitis or allergies. 12/31/18   [provider]  isosorbide mononitrate (IMDUR) 60 MG 24 hr tablet Take 60 mg by mouth 2 (two) times daily.     [provider]  nitroGLYCERIN (NITROSTAT) 0.4 MG SL tablet Place 1 tablet (0.4 mg total) under the tongue every 5 (five) minutes as needed for chest pain. 12/07/17   Minna Merritts, MD  ranolazine (RANEXA) 1000 MG SR tablet Take 1 tablet (1,000 mg total) by mouth 2 (two) times daily. 12/07/17   Minna Merritts, MD  rivaroxaban (XARELTO) 20 MG TABS tablet Take 1 tablet (20 mg total) by mouth daily. 08/14/17   Minna Merritts, MD    Physical Exam: Vitals:   03/20/19 1430 03/20/19 1530 03/20/19 1725 03/20/19 1726  BP: 101/62 97/60 114/61   Pulse: 78 74 70   Resp: (!) 28 (!) 27 (!) 21   Temp:   98.9 F (37.2 C)   TempSrc:   Oral   SpO2: 99% 99% 92% 97%  Weight:      Height:       General: Not in acute distress HEENT:       Eyes: PERRL, EOMI, no scleral icterus.       ENT: No discharge from the ears and nose, no pharynx injection, no tonsillar enlargement.        Neck: No JVD, no bruit, no mass felt. Heme: No neck lymph node enlargement. Cardiac: S1/S2, RRR, No murmurs, No gallops or rubs. Respiratory: No rales, wheezing, rhonchi or rubs. GI: Soft, nondistended, nontender, no rebound pain, no organomegaly, BS present. GU: No hematuria Ext: No pitting leg edema bilaterally. 2+DP/PT pulse bilaterally. Musculoskeletal: No joint deformities, No joint redness or warmth, no limitation of ROM in spin. Skin: No rashes.  Neuro: Alert, oriented X3, cranial nerves II-XII grossly intact, moves all extremities normally.  Psych: Patient is not psychotic, no suicidal or hemocidal ideation.  Labs on Admission: I have personally reviewed following labs and imaging  studies  CBC: Recent Labs  Lab 03/20/19 1105  WBC 7.5  HGB 14.1  HCT 42.0  MCV 95.5  PLT 123456   Basic Metabolic Panel: Recent Labs  Lab 03/20/19 1105  NA 135  K 4.5  CL 98  CO2 27  GLUCOSE 137*  BUN 18  CREATININE 1.21  CALCIUM 8.7*   GFR:  Estimated Creatinine Clearance: 53.6 mL/min (by C-G formula based on SCr of 1.21 mg/dL). Liver Function Tests: No results for input(s): AST, ALT, ALKPHOS, BILITOT, PROT, ALBUMIN in the last 168 hours. No results for input(s): LIPASE, AMYLASE in the last 168 hours. No results for input(s): AMMONIA in the last 168 hours. Coagulation Profile: No results for input(s): INR, PROTIME in the last 168 hours. Cardiac Enzymes: No results for input(s): CKTOTAL, CKMB, CKMBINDEX, TROPONINI in the last 168 hours. BNP (last 3 results) No results for input(s): PROBNP in the last 8760 hours. HbA1C: No results for input(s): HGBA1C in the last 72 hours. CBG: No results for input(s): GLUCAP in the last 168 hours. Lipid Profile: No results for input(s): CHOL, HDL, LDLCALC, TRIG, CHOLHDL, LDLDIRECT in the last 72 hours. Thyroid Function Tests: No results for input(s): TSH, T4TOTAL, FREET4, T3FREE, THYROIDAB in the last 72 hours. Anemia Panel: No results for input(s): VITAMINB12, FOLATE, FERRITIN, TIBC, IRON, RETICCTPCT in the last 72 hours. Urine analysis:    Component Value Date/Time   COLORURINE YELLOW (A) 12/08/2016 0938   APPEARANCEUR Hazy (A) 09/24/2018 1108   LABSPEC 1.015 12/08/2016 0938   PHURINE 6.0 12/08/2016 0938   GLUCOSEU Negative 09/24/2018 1108   HGBUR SMALL (A) 12/08/2016 0938   BILIRUBINUR Negative 09/24/2018 1108   KETONESUR NEGATIVE 12/08/2016 0938   PROTEINUR Negative 09/24/2018 1108   PROTEINUR NEGATIVE 12/08/2016 0938   NITRITE Negative 09/24/2018 1108   NITRITE NEGATIVE 12/08/2016 0938   LEUKOCYTESUR Negative 09/24/2018 1108   Sepsis Labs: @LABRCNTIP (procalcitonin:4,lacticidven:4) )No results found for this or any  previous visit (from the past 240 hour(s)).   Radiological Exams on Admission: DG Chest Port 1 View  Result Date: 03/20/2019 CLINICAL DATA:  Chest pain and shortness of breath. EXAM: PORTABLE CHEST 1 VIEW COMPARISON:  01/05/2019 FINDINGS: 11:19 a.m. Low volume film. Patchy peripheral airspace opacity noted bilaterally, right greater than left. Cardiopericardial silhouette is at upper limits of normal for size. No substantial pleural effusion. Sternal fusion hardware evident. Telemetry leads overlie the chest. IMPRESSION: Right greater than left patchy bilateral ground-glass airspace disease, peripherally predominant. Imaging features suggest multifocal pneumonia and atypical etiology would be a consideration. Electronically Signed   By: Misty Stanley M.D.   On: 03/20/2019 11:34     EKG: Independently reviewed.  Sinus rhythm, QTC 447, left bundle blockade, poor R wave progression, LAD.   Assessment/Plan Principal Problem:   Multifocal pneumonia Active Problems:   Essential hypertension   PAF (paroxysmal atrial fibrillation) (HCC)   Chronic diastolic heart failure (HCC)   Hyperlipemia   Pulmonary embolus (HCC)   CAD (coronary artery disease)   Multifocal pneumonia: Chest x-ray showed bilateral multifocal pneumonia.  Highly suspect COVID-19 infection, pending Covid PCR.  Covid Ag test negative.  Will start empiric treatment for possible Covid 19 infection.  - will admit as inpt - IV Rocephin and azithromycin - Mucinex for cough  -Bronchodilators - Urine legionella and S. pneumococcal antigen - Follow up blood culture x2, sputum culture - Start remdesivir empirically, pending COVID-19 PCR - Vitamin C and zinc sulfate -f/u LFT  HTN:  -Continue home medications: Metoprolol -hydralazine prn  PAF (paroxysmal atrial fibrillation) ([place]): pt seems to be on Xarelto before, now changed to Eliquis, but not started yet. -will start Eliquis since pt is high risk of developing blood clot due  to possible Covid19 infetion and hx of PE -continue metoprolol  Chronic diastolic heart failure ([place]): 2D echo 10/02/2017 showed EF of 50-60%.  Patient does not  have leg edema's.  CHF seem to be compensated. -Check BNP -Watch volume status closely  Hyperlipemia: -Lipitor and Zetia  Hx of Pulmonary embolus ([place]): -started Eliquis  CAD (coronary artery disease): Trop normal.  -Continue Lipitor, Zetia, Imdur, Ranexa, as needed nitroglycerin   Inpatient status:  # Patient requires inpatient status due to high intensity of service, high risk for further deterioration and high frequency of surveillance required.  I certify that at the point of admission it is my clinical judgment that the patient will require inpatient hospital care spanning beyond 2 midnights from the point of admission.  . This patient has multiple chronic comorbidities including hypertension, hyperlipidemia, PE on Xarelto, CAD, CABG, stent placement, dCHF, PAF, GERD . Now patient has presenting with multifocal pneumonia, suspecting COVID-19 infection . The initial radiographic and laboratory data are worrisome because of multifocal infiltration on chest x-ray. . Current medical needs: please see my assessment and plan . Predictability of an adverse outcome (risk):  Patient has multiple comorbidities as listed above. Now presents with multifocal pneumonia, suspecting COVID-19 infection. Pending Covid 19 PCR. Patient's presentation is highly complicated.  Patient is at high risk of deteriorating.  Will need to be treated in hospital for at least 2 days.        DVT ppx: on Eliquis Code Status: Full code Family Communication: not done, no family member is at bed side.    Disposition Plan:  Anticipate discharge back to previous home environment Consults called:  none Admission status: Tele bed  as inpt      Date of Service 03/20/2019    [facility]   If 7PM-7AM, please contact  night-coverage [email or web address] 03/20/2019, 6:10 PM

## 2019-03-20 NOTE — Progress Notes (Signed)
Ch visited with Pt in response to Consult for prayer. Upon arrival, Ch met with Pt and wife Inez Catalina. Pt reported that he is not doing too good; that he got pneumonia again. Pt reported that he got a negative result for one test, but is still awaiting another one for the virus. Pt looked dejected, and concerned. Pt's wife seemed to be sad. Pt reported that he feels okay now, but struggles so much to breathe; and it is worse when he stands up. Pt asked if we have bibles here. When Ch told Pt that we do, Pt requested a [place], and that he is reading Corinthians. Pt requested for prayer. Ch prayed with Pt, and left. Ch brought Bible to Pt.    03/20/19 1749  Clinical Encounter Type  Visited With Patient and family together  Visit Type Initial;Psychological support;Spiritual support;Social support  Referral From Patient  Consult/Referral To Chaplain  Spiritual Encounters  Spiritual Needs Sacred text;Prayer;Emotional  Stress Factors  Patient Stress Factors Health changes;Loss of control;Major life changes  Family Stress Factors Health changes;Loss of control

## 2019-03-21 ENCOUNTER — Inpatient Hospital Stay
Admit: 2019-03-21 | Discharge: 2019-03-21 | Disposition: A | Discharge status: Home / Self Care | Payer: Medicare HMO | Attending: Pulmonary Disease | Admitting: Pulmonary Disease

## 2019-03-21 DIAGNOSIS — I480 Paroxysmal atrial fibrillation: Secondary | ICD-10-CM

## 2019-03-21 DIAGNOSIS — I2583 Coronary atherosclerosis due to lipid rich plaque: Secondary | ICD-10-CM

## 2019-03-21 DIAGNOSIS — I2510 Atherosclerotic heart disease of native coronary artery without angina pectoris: Secondary | ICD-10-CM

## 2019-03-21 LAB — EXPECTORATED SPUTUM ASSESSMENT W GRAM STAIN, RFLX TO RESP C: Special Requests: NORMAL

## 2019-03-21 LAB — PROCALCITONIN: Procalcitonin: <0.1 ng/mL

## 2019-03-21 LAB — STREP PNEUMONIAE URINARY ANTIGEN: Strep Pneumo Urinary Antigen: NEGATIVE

## 2019-03-21 MED ORDER — IPRATROPIUM-ALBUTEROL 0.5-2.5 (3) MG/3ML IN SOLN
3.0000 mL | Freq: Three times a day (TID) | RESPIRATORY_TRACT | Status: DC
  Administered 2019-03-21 – 2019-03-25 (×13): 3 mL via RESPIRATORY_TRACT
  Filled 2019-03-21 (×13): qty 3

## 2019-03-21 MED ORDER — APIXABAN 5 MG PO TABS: 5.0000 mg | ORAL_TABLET | Freq: Two times a day (BID) | ORAL | Status: DC

## 2019-03-21 MED ORDER — MOMETASONE FURO-FORMOTEROL FUM 100-5 MCG/ACT IN AERO
2.0000 | INHALATION_SPRAY | Freq: Two times a day (BID) | RESPIRATORY_TRACT | Status: DC
  Administered 2019-03-21 – 2019-03-25 (×8): 2 via RESPIRATORY_TRACT
  Filled 2019-03-21 (×2): qty 8.8

## 2019-03-21 MED ORDER — ENSURE ENLIVE PO LIQD
237.0000 mL | Freq: Two times a day (BID) | ORAL | Status: DC
  Administered 2019-03-21 – 2019-03-23 (×5): 237 mL via ORAL

## 2019-03-21 MED ORDER — FUROSEMIDE 10 MG/ML IJ SOLN
40.0000 mg | Freq: Once | INTRAMUSCULAR | Status: AC
  Administered 2019-03-22: 40 mg via INTRAVENOUS
  Filled 2019-03-21: qty 4

## 2019-03-21 NOTE — Progress Notes (Signed)
   03/21/19 1145  Clinical Encounter Type  Visited With Patient and family together  Visit Type Follow-up  Referral From Chaplain  Consult/Referral To [place] visited with patient and wife as a follow-up from a visit with Oneita Hurt. Patient said that he is feeling much better, but not rushing home. Patient wants to make sure he is doing really well before going home because he doesn't want to be readmitted. Chaplain enjoyed conversation with patient and wife. We talked about patient enjoying reading and who some of his favorite authors are.The Beckoms have been married since 2014 and both of their previous spouses died from cancer.Chaplain told Mrs. Service that she is here to minister to her as well as her husband. Patient believes he is being discharged over the weekend.  Chaplain offered pastoral presence, empathy, and prayer.

## 2019-03-21 NOTE — Consult Note (Addendum)
Pulmonary Medicine          Date: 03/21/2019,   MRN# [id number] Scott [name] [birth year]  AdmissionWeight: 76.2 kg                 CurrentWeight: 76.2 kg  Referring physician: Dr. Loleta Books    CHIEF COMPLAINT:   Acute on chronic hypoxemic respiratory failure   HISTORY OF PRESENT ILLNESS   This is very pleasant 79 year old male with a history of dyspnea on exertion with chronic hypoxemia, 46-pack-year smoking history and centrilobular emphysema but without COPD.  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  He had PE in 2018 which was from the left upper lobe subsegmental artery up to the left main pulmonary artery, this has been reimaged since then has resolved as patient has been on Xarelto for chronic paroxysmal atrial fibrillation.  Most recently in the last month he has redeveloped hemoptysis and epistaxis.  He had hematuria in July 2020 and required urology evaluation with cystoscopy.  Previous to that he had hemoptysis on 11/09/2017, was found to have right middle lobe infiltrate and was admitted to the ED at that time this was thought secondary to Dresser which was held.  Patient states that he has had episodes of intermittent hemoptysis in the last several weeks on and off, his epistaxis has resolved and most recently hemoptysis has subsided in the last 24 hours. Patient denies having fevers, sick contacts he has been tested for coronavirus multiple times in the hospital and in clinic which is negative each time, does not have an elevated white count and increased lactate, does not appear to toxic on clinical exam, and has had a subacute process thus far ranging from 4 to 6 weeks in timeline at this point.  PAST MEDICAL HISTORY   Past Medical History:  Diagnosis Date  . Arthritis    right hip, since a fall  . CAD (coronary artery disease)    a.  04/2012 Cath: 3VD->Med Rx;  b. 04/2013 PCI RCA (2 DES); c. 03/2014 PCI: LAD 80 (3.0x23 Xience Alpine DES); d. 06/2014 CABG x 2 ([facility]) LIMA->LAD, VG->OM; e. 12/2014 Cath ([facility]): patent grafts->Med Rx; f. 08/2015 Cath: patent grafts; g. 11/2016 Cath: LM min irregs, LAD 20p, 34m, LCX 100p/m, RCA 20ost, 10p/m/d, VG->OM1 nl, LIMA->dLAD nl; f. 07/2017 MV: No isch, EF 51%.  . Cardiomyopathy, ischemic    a. 04/2012 Echo: EF 40-45%;  b. 08/2013 Echo: EF 45-50%; c. 01/2015 Echo: EF 40-45%; d. 07/2016 Echo: EF 60-65%; e. 09/2017 Echo: EF 55-60%, Gr1 DD.  . Carotid arterial disease ([place])    a. 05/2013 Carotid U/S; bilat 40-50% ICA stenosis.  . Chronic Chest Pain    USES NITRO  . Chronic systolic CHF (congestive heart failure) ([place])    a. 01/2015 Echo: EF 40-45%; b. 07/2016 Echo: EF 60-65%; c. 09/2017 Echo: EF 55-60%, Gr1 DD, nl RV fxn.  . Cough   . Diverticulosis   . Dizziness   . Dyspnea    WITH EXERTION  . Headache 1970's   migraine  . History of blood transfusion 2016   post op  . Hyperlipidemia   . Hypertension   . Iron deficiency anemia due to chronic blood loss 09/11/2016  . Myocardial infarction [facility])    unsure of when  . Pulmonary emboli ([place]) 07/2016   a. On Xarelto.  . Reflux esophagitis   .  Sternal pain    a. 03/2016 s/p Sternal wire removal; b. 05/2016 s/p redo median sternotomy for sternal debridement and sternal plating.  . Syncope 04/2018   CARDIOLOGIST WAS DR Rockey Situ AND HE WAS AWARE-NOW PT SEES PARASCHOS  . Tubular adenoma of colon      SURGICAL HISTORY   Past Surgical History:  Procedure Laterality Date  . CARDIAC CATHETERIZATION  05/01/2012  . CARDIAC CATHETERIZATION  04/2013   [facility];x3 stent  . CARDIAC CATHETERIZATION  01/11/15    [facility]  . CARDIAC CATHETERIZATION N/A 09/16/2015   Procedure: LEFT HEART CATH AND CORS/GRAFTS ANGIOGRAPHY;  Surgeon: Minna Merritts, MD;  Location: [place] [facility];  Service: Cardiovascular;  Laterality: N/A;  . CARPAL TUNNEL RELEASE     right hand  .  CATARACT EXTRACTION     LEFT  . CATARACT EXTRACTION W/PHACO Left 09/16/2014   Procedure: CATARACT EXTRACTION PHACO AND INTRAOCULAR LENS PLACEMENT (IOC);  Surgeon: Leandrew Koyanagi, MD;  Location: [place];  Service: Ophthalmology;  Laterality: Left;  . COLONOSCOPY    . COLONOSCOPY WITH PROPOFOL N/A 01/28/2016   Procedure: COLONOSCOPY WITH PROPOFOL;  Surgeon: Lollie Sails, MD;  Location: [facility] ENDOSCOPY;  Service: Endoscopy;  Laterality: N/A;  . CORONARY ANGIOPLASTY WITH STENT PLACEMENT  04/13/2014  . CORONARY ARTERY BYPASS GRAFT  06-26-14   x3 bypasses  . CORONARY PRESSURE WIRE/FFR WITH 3D MAPPING N/A 12/10/2018   Procedure: Suzette Battiest PRESSURE WIRE/FFR STUDY;  Surgeon: Isaias Cowman, MD;  Location: [place] [facility];  Service: Cardiovascular;  Laterality: N/A;  . CYSTOSCOPY N/A 08/14/2018   Procedure: CYSTOSCOPY;  Surgeon: Abbie Sons, MD;  Location: [facility];  Service: Urology;  Laterality: N/A;  . DE QUERVAIN'S RELEASE Left 08/22/2012  . ESOPHAGOGASTRODUODENOSCOPY (EGD) WITH PROPOFOL N/A 04/26/2015   Procedure: ESOPHAGOGASTRODUODENOSCOPY (EGD) WITH PROPOFOL;  Surgeon: Hulen Luster, MD;  [facility] END[facility]COPY;  Service: Gastroenterology;  Laterality: N/A;  . ESOPHAGOGASTRODUODENOSCOPY (EGD) WITH PROPOFOL N/A 05/29/2017   Procedure: ESOPHAGOGASTRODUODENOSCOPY (EGD) WITH PROPOFOL;  Surgeon: Lollie Sails, MD;  Location: [facility] ENDOSCOPY;  Service: Endoscopy;  Laterality: N/A;  . INTRAVASCULAR ULTRASOUND/IVUS N/A 12/10/2018   Procedure: Intravascular Ultrasound/IVUS;  Surgeon: Isaias Cowman, MD;  Location: [place] [facility];  Service: Cardiovascular;  Laterality: N/A;  . LEFT HEART CATH AND CORONARY ANGIOGRAPHY N/A 12/04/2016   Procedure: LEFT HEART CATH AND CORS/GRAFTS  ANGIOGRAPHY;  Surgeon: Wellington Hampshire, MD;  Location: [place] [facility];  Service: Cardiovascular;  Laterality: N/A;  . LEFT HEART CATH AND CORS/GRAFTS ANGIOGRAPHY N/A 04/09/2018    Procedure: LEFT HEART CATH AND CORS/GRAFTS ANGIOGRAPHY;  Surgeon: Isaias Cowman, MD;  Location: [place] [facility];  Service: Cardiovascular;  Laterality: N/A;  . LEFT HEART CATH AND CORS/GRAFTS ANGIOGRAPHY N/A 12/10/2018   Procedure: LEFT HEART CATH AND CORS/GRAFTS ANGIOGRAPHY;  Surgeon: Isaias Cowman, MD;  Location: [place] [facility];  Service: Cardiovascular;  Laterality: N/A;  . RIB PLATING N/A 05/25/2016   Procedure: STERNAL PLATING;  Surgeon: Melrose Nakayama, MD;  Location: [place];  Service: Thoracic;  Laterality: N/A;  . right shoulder    . STERNAL WIRES REMOVAL N/A 03/30/2016   Procedure: STERNAL WIRES REMOVAL;  Surgeon: Melrose Nakayama, MD;  Location: [place];  Service: Thoracic;  Laterality: N/A;  . TONSILLECTOMY       FAMILY HISTORY   Family History  Problem Relation Age of Onset  . Heart attack Father 87       MI  . Cancer Maternal Uncle  SOCIAL HISTORY   Social History   Tobacco Use  . Smoking status: Former Smoker    Packs/day: 1.00    Years: 35.00    Pack years: 35.00    Types: Cigarettes    Start date: 05/02/2009  . Smokeless tobacco: Former Network engineer Use Topics  . Alcohol use: No  . Drug use: No     MEDICATIONS    Home Medication:    Current Medication:  Current Facility-Administered Medications:  .  0.9 %  sodium chloride infusion, , Intravenous, PRN, Ivor Costa, MD, Last Rate: 10 mL/hr at 03/20/19 2131, 250 mL at 03/20/19 2131 .  acetaminophen (TYLENOL) tablet 650 mg, 650 mg, Oral, Q6H PRN, Ivor Costa, MD, 650 mg at 03/20/19 2138 .  apixaban (ELIQUIS) tablet 5 mg, 5 mg, Oral, BID, Danford, Suann Larry, MD .  ascorbic acid (VITAMIN C) tablet 500 mg, 500 mg, Oral, Daily, Ivor Costa, MD, 500 mg at 03/21/19 1000 .  atorvastatin (LIPITOR) tablet 80 mg, 80 mg, Oral, q1800, Ivor Costa, MD .  azithromycin (ZITHROMAX) 500 mg in sodium chloride 0.9 % 250 mL IVPB, 500 mg, Intravenous, Daily, Ivor Costa, MD, Stopped at  03/20/19 1557 .  cefTRIAXone (ROCEPHIN) 2 g in sodium chloride 0.9 % 100 mL IVPB, 2 g, Intravenous, Daily, Ivor Costa, MD, Last Rate: 200 mL/hr at 03/21/19 0958, 2 g at 03/21/19 0958 .  dextromethorphan-guaiFENesin (MUCINEX DM) 30-600 MG per 12 hr tablet 1 tablet, 1 tablet, Oral, BID, Ivor Costa, MD, 1 tablet at 03/21/19 1000 .  ezetimibe (ZETIA) tablet 10 mg, 10 mg, Oral, Daily, Ivor Costa, MD, 10 mg at 03/21/19 1000 .  hydrALAZINE (APRESOLINE) tablet 25 mg, 25 mg, Oral, TID PRN, Ivor Costa, MD .  ipratropium-albuterol (DUONEB) 0.5-2.5 (3) MG/3ML nebulizer solution 3 mL, 3 mL, Nebulization, TID, Danford, Suann Larry, MD .  isosorbide mononitrate (IMDUR) 24 hr tablet 60 mg, 60 mg, Oral, BID, Ivor Costa, MD, 60 mg at 03/21/19 1000 .  metoprolol succinate (TOPROL-XL) 24 hr tablet 25 mg, 25 mg, Oral, QPM, Ivor Costa, MD, 25 mg at 03/20/19 1853 .  nitroGLYCERIN (NITROSTAT) SL tablet 0.4 mg, 0.4 mg, Sublingual, Q5 min PRN, Ivor Costa, MD .  ondansetron [facility]) injection 4 mg, 4 mg, Intravenous, Q8H PRN, Ivor Costa, MD .  ranolazine (RANEXA) 12 hr tablet 1,000 mg, 1,000 mg, Oral, BID, Ivor Costa, MD, 1,000 mg at 03/21/19 1000 .  sodium chloride flush (NS) 0.9 % injection 3 mL, 3 mL, Intravenous, Once, Ivor Costa, MD .  zinc sulfate capsule 220 mg, 220 mg, Oral, Daily, Ivor Costa, MD, 220 mg at 03/21/19 1059    ALLERGIES   Contrast media [iodinated diagnostic agents], Metrizamide, Oxycodone hcl, and Vicodin [hydrocodone-acetaminophen]     REVIEW OF SYSTEMS    Review of Systems:  Gen:  Denies  fever, sweats, chills weigh loss  HEENT: Denies blurred vision, double vision, ear pain, eye pain, hearing loss, nose bleeds, sore throat Cardiac:  No dizziness, chest pain or heaviness, chest tightness,edema Resp:   Denies cough or sputum porduction, shortness of breath,wheezing, hemoptysis,  Gi: Denies swallowing difficulty, stomach pain, nausea or vomiting, diarrhea, constipation, bowel  incontinence Gu:  Denies bladder incontinence, burning urine Ext:   Denies Joint pain, stiffness or swelling Skin: Denies  skin rash, easy bruising or bleeding or hives Endoc:  Denies polyuria, polydipsia , polyphagia or weight change Psych:   Denies depression, insomnia or hallucinations   Other:  All other systems negative   VS:  BP 112/69 (BP Location: Right Arm)   Pulse 70   Temp 97.7 F (36.5 C) (Oral)   Resp 16   Ht 5\' 10"  (1.778 m)   Wt 76.2 kg   SpO2 93%   BMI 24.11 kg/m      PHYSICAL EXAM    GENERAL:NAD, no fevers, chills, no weakness no fatigue HEAD: Normocephalic, atraumatic.  EYES: Pupils equal, round, reactive to light. Extraocular muscles intact. No scleral icterus.  MOUTH: Moist mucosal membrane. Dentition intact. No abscess noted.  EAR, NOSE, THROAT: Clear without exudates. No external lesions.  NECK: Supple. No thyromegaly. No nodules. No JVD.  PULMONARY: Decreased breath sounds bilaterally without adventitious lung sounds CARDIOVASCULAR: S1 and S2. Regular rate and rhythm. No murmurs, rubs, or gallops. No edema. Pedal pulses 2+ bilaterally.  GASTROINTESTINAL: Soft, nontender, nondistended. No masses. Positive bowel sounds. No hepatosplenomegaly.  MUSCULOSKELETAL: No swelling, clubbing, or edema. Range of motion full in all extremities.  NEUROLOGIC: Cranial nerves II through XII are intact. No gross focal neurological deficits. Sensation intact. Reflexes intact.  SKIN: No ulceration, lesions, rashes, or cyanosis. Skin warm and dry. Turgor intact.  PSYCHIATRIC: Mood, affect within normal limits. The patient is awake, alert and oriented x 3. Insight, judgment intact.       IMAGING    MR ANGIO NECK W WO CONTRAST  Result Date: 02/24/2019 CLINICAL DATA:  Dizziness over the last 3-5 weeks. Balance disturbance. Occluded carotid artery. EXAM: MRA NECK WITHOUT AND WITH CONTRAST TECHNIQUE: Multiplanar and multiecho pulse sequences of the neck were obtained without  and with intravenous contrast. Angiographic images of the neck were obtained using MRA technique without and with intravenous contrast. CONTRAST:  58mL GADAVIST GADOBUTROL 1 MMOL/ML IV SOLN COMPARISON:  02/17/2019.  02/18/2019 FINDINGS: Branching pattern of the arch is normal. No origin stenoses. Both common carotid arteries are widely patent to their respective bifurcation. On the right, there is mild atherosclerotic irregularity of the ICA bulb but no stenosis. Cervical ICA widely patent beyond that. On the left, there is mild atherosclerotic irregularity in the ICA bulb but no stenosis. Cervical ICA widely patent beyond that. Both vertebral artery origins are patent. The left vertebral artery is dominant. Both vertebral arteries appear normal through the cervical region to the foramen magnum. The examination also includes the majority of the intracranial circulation. No anterior circulation stenosis or occlusion is seen. No basilar stenosis. Posterior circulation branch vessels show flow. Right PCA takes fetal origin the anterior circulation. IMPRESSION: Mild atherosclerotic irregularity of both ICA bulb regions but no carotid stenosis. No evidence of occlusion or significant focal vascular lesion. These results will be called to the ordering clinician or representative by the Radiologist Assistant, and communication documented in the PACS or zVision Dashboard. Electronically Signed   By: Nelson Chimes M.D.   On: 02/24/2019 12:30   DG Chest Port 1 View  Result Date: 03/20/2019 CLINICAL DATA:  Chest pain and shortness of breath. EXAM: PORTABLE CHEST 1 VIEW COMPARISON:  01/05/2019 FINDINGS: 11:19 a.m. Low volume film. Patchy peripheral airspace opacity noted bilaterally, right greater than left. Cardiopericardial silhouette is at upper limits of normal for size. No substantial pleural effusion. Sternal fusion hardware evident. Telemetry leads overlie the chest. IMPRESSION: Right greater than left patchy bilateral  ground-glass airspace disease, peripherally predominant. Imaging features suggest multifocal pneumonia and atypical etiology would be a consideration. Electronically Signed   By: Misty Stanley M.D.   On: 03/20/2019 11:34  ASSESSMENT/PLAN   Acute on chronic hypoxemic respiratory failure -Clinical presentation less likely infectious due to absence of fevers, leukocytosis, significant abnormalities in blood pressure or vital signs sites hypoxemia -Septic work-up has been done and is in process -Agree with empiric community-acquired pneumonia therapy currently on Zithromax Rocephin -Most likely related to mitral valve regurgitation and anticoagulation with resultant pulmonary hemorrhage - will hold Eliquis at this time and will discuss with with cardiology -We will repeat transthoracic echo -Patient sees Dr. Saralyn Pilar for cardiology will consult for additional evaluation -Complicated cardiac history including CABG, multivessel CAD, PAF, mitral valve regurgitation,  CHF with reduced EF -Patient may need bronchoscopy when in chronic stable state on outpatient basis    Non-massive hemoptysis -Patient's had recurrent low volume hemoptysis in the past and recently last month which has self resolved    History of pulmonary embolism -Patient had left upper lobe segmental PE in 2018 which tracked all the way up to left main PA, repeat CT chest showed resolution of clot burden. 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   Patient/Family are satisfied with care plan and all questions have been answered.  This document was prepared using Dragon voice recognition software and may include unintentional dictation errors.     Ottie Glazier, M.D.  Division of [place]

## 2019-03-21 NOTE — Progress Notes (Signed)
PROGRESS NOTE    Scott [name]  [id number] DOB: 02-Jul-1940 DOA: 03/20/2019 PCP: Scott Hire, MD      Brief Narrative:  Scott [name] is a 79 y.o. M with CAD last PCI >55yr, ischemic CM, sCHF EF 55% with chronic respiratory failure on 3L at baseline, PVD, HTN, and hx PE on Eliquis who presented with fever, cough, SOB for 1 week.  IN the ER, COVID PCR negative, CXR showed multifocal pneumonia.  Sats stable on home O2 (but had desaturated to 70s at home).       Assessment & Plan:   Acute on chronic hypoxic respiratory failure likely due to CAP Community-acquired pneumonia, multifocal Patient admitted and started on empiric antibiotics, remdesivir and steroids.  COVID PCR negative.    Strep pneumo antigen negative, sputum culture pending.  At baseline, patient is on 3L O2, but interestingly, only in the last month.  No orthopnea.  Very dyspneic and desaturated to 70s today with ambulating a short distance. -Continue ceftriaxone and azithromycin -Continue pulmonary toilet -Follow urine Legionella and strep pneumo antigens -Follow blood culture and sputum culture -Stop remdesivir and steroids -Trend procalcitonin -Consult Pulmonology -Lasix x1   Atrial fibrillation, paroxysmal -Continue Eliquis, metoprolol  Hypertension Coronary disease secondary prevention Peripheral vascular disease secondary prevention -Continue atorvastatin, Zetia -Continue Imdur, metoprolol, ranolazine  History of venous thromboembolism -Continue Eliquis  Chronic systolic CHF Appears euvolemic, not on diuretics normally, BNP low, EF previously <45%, has returned to normal on last echo 2019. -Lasix x1 -Strict I/Os, daily weights -BMP tomorrow        Disposition: The patient was admitted with tachypnea 30 breaths/min, severe dyspnea, chills, subjective fevers, no opacities on chest x-ray, and desaturations to the 70s on minimal exertion.  This is a acute illness with severe threat to  life or bodily function, pneumonia with high risk of mortality.  He is still having severe desaturations with minimal exertion and is severely dyspneic with walking just a few feet, nearly passing out.         MDM: The below labs and imaging reports were reviewed and summarized above.  Medication management as above.   DVT prophylaxis: Not applicable, on Eliquis Code Status: Full code Family Communication: Scott [name] at the bedside    Consultants:   Pulmonary  Procedures:   2/25 chest x-ray-multifocal pneumonia  Antimicrobials:   Ceftriaxone and azithromycin 2/25>>  Culture data:   2/25 blood culture x2-no growth to date  2/25 sputum culture-pending          Subjective: Patient still feeling dyspneic.  He nearly passed out today walking with nursing.  He has no confusion, fever, vomiting.  No orthopnea.  No leg swelling.  Objective: Vitals:   03/20/19 1926 03/20/19 2146 03/21/19 0648 03/21/19 0751  BP:  115/64 112/69   Pulse:  78 69 70  Resp:  (!) 24 20 16   Temp:  98.9 F (37.2 C) 97.7 F (36.5 C)   TempSrc:  Oral Oral   SpO2: 98% 97% 95% 93%  Weight:      Height:        Intake/Output Summary (Last 24 hours) at 03/21/2019 1220 Last data filed at 03/21/2019 1019 Gross per 24 hour  Intake 480 ml  Output 1875 ml  Net -1395 ml   Filed Weights   03/20/19 1051  Weight: 76.2 kg    Examination: General appearance: Elderly adult male, alert and in no acute distress.   HEENT: Anicteric, conjunctiva pink, lids and lashes normal.  No nasal deformity, discharge, epistaxis.  Lips moist, dentition normal, oropharynx moist, no oral lesions, hearing normal.   Skin: Warm and dry.  No jaundice.  No suspicious rashes or lesions. Cardiac: RRR, nl S1-S2, no murmurs appreciated.  Capillary refill is brisk.  JVP normal.  No LE edema.  Radial pulses 2+ and symmetric. Respiratory: Tachypneic at rest, belly breathing noted.  No accessory muscle use.  Lungs clear, but  diminished, no rales or wheezing.   Abdomen: Abdomen soft.  No TTP or guarding. No ascites, distension, hepatosplenomegaly.   MSK: No deformities or effusions.  Normal muscle bulk and tone for age. Neuro: Awake and alert.  EOMI, moves all extremities. Speech fluent.    Psych: Sensorium intact and responding to questions, attention normal. Affect normal.  Judgment and insight appear normal.    Data Reviewed: I have personally reviewed following labs and imaging studies:  CBC: Recent Labs  Lab 03/20/19 1105  WBC 7.5  HGB 14.1  HCT 42.0  MCV 95.5  PLT 123456   Basic Metabolic Panel: Recent Labs  Lab 03/20/19 1105  NA 135  K 4.5  CL 98  CO2 27  GLUCOSE 137*  BUN 18  CREATININE 1.21  CALCIUM 8.7*   GFR: Estimated Creatinine Clearance: 53.6 mL/min (by C-G formula based on SCr of 1.21 mg/dL). Liver Function Tests: Recent Labs  Lab 03/20/19 1146  AST 22  ALT 15  ALKPHOS 55  BILITOT 1.0  PROT 7.7  ALBUMIN 3.1*   No results for input(s): LIPASE, AMYLASE in the last 168 hours. No results for input(s): AMMONIA in the last 168 hours. Coagulation Profile: No results for input(s): INR, PROTIME in the last 168 hours. Cardiac Enzymes: No results for input(s): CKTOTAL, CKMB, CKMBINDEX, TROPONINI in the last 168 hours. BNP (last 3 results) No results for input(s): PROBNP in the last 8760 hours. HbA1C: No results for input(s): HGBA1C in the last 72 hours. CBG: No results for input(s): GLUCAP in the last 168 hours. Lipid Profile: No results for input(s): CHOL, HDL, LDLCALC, TRIG, CHOLHDL, LDLDIRECT in the last 72 hours. Thyroid Function Tests: No results for input(s): TSH, T4TOTAL, FREET4, T3FREE, THYROIDAB in the last 72 hours. Anemia Panel: No results for input(s): VITAMINB12, FOLATE, FERRITIN, TIBC, IRON, RETICCTPCT in the last 72 hours. Urine analysis:    Component Value Date/Time   COLORURINE YELLOW (A) 12/08/2016 0938   APPEARANCEUR Hazy (A) 09/24/2018 1108    LABSPEC 1.015 12/08/2016 0938   PHURINE 6.0 12/08/2016 0938   GLUCOSEU Negative 09/24/2018 1108   HGBUR SMALL (A) 12/08/2016 0938   BILIRUBINUR Negative 09/24/2018 1108   KETONESUR NEGATIVE 12/08/2016 0938   PROTEINUR Negative 09/24/2018 1108   PROTEINUR NEGATIVE 12/08/2016 0938   NITRITE Negative 09/24/2018 1108   NITRITE NEGATIVE 12/08/2016 0938   LEUKOCYTESUR Negative 09/24/2018 1108   Sepsis Labs: @LABRCNTIP (procalcitonin:4,lacticacidven:4)  ) Recent Results (from the past 240 hour(s))  SARS CORONAVIRUS 2 (TAT 6-24 HRS) Nasopharyngeal Nasopharyngeal Swab     Status: None   Collection Time: 03/20/19  1:21 PM   Specimen: Nasopharyngeal Swab  Result Value Ref Range Status   SARS Coronavirus 2 NEGATIVE NEGATIVE Final    Comment: (NOTE) SARS-CoV-2 target nucleic acids are NOT DETECTED. The SARS-CoV-2 RNA is generally detectable in upper and lower respiratory specimens during the acute phase of infection. Negative results do not preclude SARS-CoV-2 infection, do not rule out co-infections with other pathogens, and should not be used as the sole basis for treatment or other patient management  decisions. Negative results must be combined with clinical observations, patient history, and epidemiological information. The expected result is Negative. Fact Sheet for Patients: SugarRoll.be Fact Sheet for Healthcare Providers: https://www.woods-mathews.com/ This test is not yet approved or cleared by the [place] FDA and  has been authorized for detection and/or diagnosis of SARS-CoV-2 by FDA under an Emergency Use Authorization (EUA). This EUA will remain  in effect (meaning this test can be used) for the duration of the COVID-19 declaration under Section 56 4(b)(1) of the Act, 21 U.S.C. section 360bbb-3(b)(1), unless the authorization is terminated or revoked sooner. Performed at [facility], [place] [address], [place],  [place] [address]   CULTURE, BLOOD (ROUTINE X 2) w Reflex to ID Panel     Status: None (Preliminary result)   Collection Time: 03/20/19  5:48 PM   Specimen: BLOOD  Result Value Ref Range Status   Specimen Description BLOOD BLOOD RIGHT HAND  Final   Special Requests   Final    BOTTLES DRAWN AEROBIC AND ANAEROBIC Blood Culture adequate volume   Culture   Final    NO GROWTH < 12 HOURS Performed at [facility], [address] [place], [place] [address]    Report Status PENDING  Incomplete  CULTURE, BLOOD (ROUTINE X 2) w Reflex to ID Panel     Status: None (Preliminary result)   Collection Time: 03/20/19  5:48 PM   Specimen: BLOOD  Result Value Ref Range Status   Specimen Description BLOOD RIGHT ANTECUBITAL  Final   Special Requests   Final    BOTTLES DRAWN AEROBIC AND ANAEROBIC Blood Culture adequate volume   Culture   Final    NO GROWTH < 12 HOURS Performed at [facility], [address] [place], [place] [address]    Report Status PENDING  Incomplete  Culture, sputum-assessment     Status: None   Collection Time: 03/20/19  9:45 PM   Specimen: Expectorated Sputum  Result Value Ref Range Status   Specimen Description EXPECTORATED SPUTUM  Final   Special Requests NONE  Final   Sputum evaluation   Final    THIS SPECIMEN IS ACCEPTABLE FOR SPUTUM CULTURE Performed at [facility], [address] [place], [place] [address]    Report Status 03/20/2019 FINAL  Final         Radiology Studies: [facility] Chest Port 1 View  Result Date: 03/20/2019 CLINICAL DATA:  Chest pain and shortness of breath. EXAM: PORTABLE CHEST 1 VIEW COMPARISON:  01/05/2019 FINDINGS: 11:19 a.m. Low volume film. Patchy peripheral airspace opacity noted bilaterally, right greater than left. Cardiopericardial silhouette is at upper limits of normal for size. No substantial pleural effusion. Sternal fusion hardware evident. Telemetry leads overlie the chest. IMPRESSION: Right greater than left  patchy bilateral ground-glass airspace disease, peripherally predominant. Imaging features suggest multifocal pneumonia and atypical etiology would be a consideration. Electronically Signed   By: Misty Stanley M.D.   On: 03/20/2019 11:34        Scheduled Meds: . apixaban  5 mg Oral BID  . vitamin C  500 mg Oral Daily  . atorvastatin  80 mg Oral q1800  . dextromethorphan-guaiFENesin  1 tablet Oral BID  . ezetimibe  10 mg Oral Daily  . ipratropium-albuterol  3 mL Nebulization TID  . isosorbide mononitrate  60 mg Oral BID  . metoprolol succinate  25 mg Oral QPM  . ranolazine  1,000 mg Oral BID  . sodium chloride flush  3  mL Intravenous Once  . zinc sulfate  220 mg Oral Daily   Continuous Infusions: . sodium chloride 250 mL (03/20/19 2131)  . azithromycin Stopped (03/20/19 1557)  . cefTRIAXone (ROCEPHIN)  IV 2 g (03/21/19 0958)     LOS: 1 day    Time spent: 35 minutes    Edwin Dada, MD [facility] 03/21/2019, 12:20 PM     Please page though [place] or Epic secure chat:  For [employer], Adult nurse

## 2019-03-21 NOTE — Progress Notes (Signed)
Initial Nutrition Assessment  DOCUMENTATION CODES:   Not applicable  INTERVENTION:   Ensure Enlive po BID, each supplement provides 350 kcal and 20 grams of protein  Magic cup TID with meals, each supplement provides 290 kcal and 9 grams of protein  NUTRITION DIAGNOSIS:   Increased nutrient needs related to acute illness(CAP) as evidenced by increased estimated needs.  GOAL:   Patient will meet greater than or equal to 90% of their needs  MONITOR:   PO intake, Supplement acceptance, Labs, Weight trends, Skin, I & O's  REASON FOR ASSESSMENT:   Malnutrition Screening Tool    ASSESSMENT:   79 y.o. male with medical history significant of hypertension, hyperlipidemia, PE on Xarelto, CAD, CABG, stent placement, dCHF, PAF, GERD, who presents with shortness breath and was found to have CAP   Nutrition related history provided by patients wife at bedside who reports pt with poor appetite and oral intake for several days pta. Wife reports that pt's appetite is improved in hospital but he is still not eating like normal. Per chart, pt is documented to have eaten 100% of his breakfast and lunch today. RD will add supplements to help pt meet his estimated needs. Pt has been compliant with supplements in the past. Per chart, pt has lost 9lbs(5%) over the past 2 months; RD unsure how recently weight loss actually occurred. Pt appears fairly weight stable at baseline.   Medications reviewed and include: vitamin C, zinc, azithromycin, ceftriaxone   Labs reviewed:    NUTRITION - FOCUSED PHYSICAL EXAM:    Most Recent Value  Orbital Region  No depletion  Upper Arm Region  No depletion  Thoracic and Lumbar Region  No depletion  Buccal Region  No depletion  Temple Region  Mild depletion  Clavicle Bone Region  Mild depletion  Clavicle and Acromion Bone Region  Mild depletion  Scapular Bone Region  Unable to assess  Dorsal Hand  Mild depletion  Patellar Region  Mild depletion  Anterior  Thigh Region  Mild depletion  Posterior Calf Region  Mild depletion  Edema (RD Assessment)  None  Hair  Reviewed  Eyes  Reviewed  Mouth  Reviewed  Skin  Reviewed  Nails  Reviewed     Diet Order:   Diet Order            Diet Heart Room service appropriate? Yes; Fluid consistency: Thin  Diet effective now             EDUCATION NEEDS:   No education needs have been identified at this time  Skin:  Skin Assessment: Reviewed RN Assessment  Last BM:  2/25  Height:   Ht Readings from Last 1 Encounters:  03/20/19 5\' 10"  (1.778 m)    Weight:   Wt Readings from Last 1 Encounters:  03/20/19 76.2 kg    Ideal Body Weight:  75.4 kg  BMI:  Body mass index is 24.11 kg/m.  Estimated Nutritional Needs:   Kcal:  2000-2300kcal/day  Protein:  100-115g/day  Fluid:  >1.9L/day  Koleen Distance MS, RD, LDN Contact information available in Amion

## 2019-03-22 LAB — ECHOCARDIOGRAM COMPLETE
Height: 70 in
Weight: 2688 oz

## 2019-03-22 LAB — BASIC METABOLIC PANEL
Anion gap: 6 (ref 5–15)
BUN: 28 mg/dL — ABNORMAL HIGH (ref 8–23)
CO2: 25 mmol/L (ref 22–32)
Calcium: 8.3 mg/dL — ABNORMAL LOW (ref 8.9–10.3)
Chloride: 107 mmol/L (ref 98–111)
Creatinine, Ser: 0.95 mg/dL (ref 0.61–1.24)
GFR calc Af Amer: >60 mL/min (ref >60)
GFR calc non Af Amer: >60 mL/min (ref >60)
Glucose, Bld: 144 mg/dL — ABNORMAL HIGH (ref 70–99)
Potassium: 4.3 mmol/L (ref 3.5–5.1)
Sodium: 138 mmol/L (ref 135–145)

## 2019-03-22 LAB — CBC
HCT: 35.5 % — ABNORMAL LOW (ref 39.0–52.0)
Hemoglobin: 11.9 g/dL — ABNORMAL LOW (ref 13.0–17.0)
MCH: 31.8 pg (ref 26.0–34.0)
MCHC: 33.5 g/dL (ref 30.0–36.0)
MCV: 94.9 fL (ref 80.0–100.0)
Platelets: 304 10*3/uL (ref 150–400)
RBC: 3.74 MIL/uL — ABNORMAL LOW (ref 4.22–5.81)
RDW: 14 % (ref 11.5–15.5)
WBC: 11.3 10*3/uL — ABNORMAL HIGH (ref 4.0–10.5)
nRBC: 0 % (ref 0.0–0.2)

## 2019-03-22 LAB — LEGIONELLA PNEUMOPHILA SEROGP 1 UR AG: L. pneumophila Serogp 1 Ur Ag: NEGATIVE

## 2019-03-22 LAB — PROCALCITONIN: Procalcitonin: 0.12 ng/mL

## 2019-03-22 MED ORDER — ALBUTEROL SULFATE (2.5 MG/3ML) 0.083% IN NEBU
2.5000 mg | INHALATION_SOLUTION | RESPIRATORY_TRACT | Status: DC | PRN
  Administered 2019-03-23: 06:00:00 2.5 mg via RESPIRATORY_TRACT
  Filled 2019-03-22 (×2): qty 3

## 2019-03-22 MED ORDER — AZITHROMYCIN 250 MG PO TABS
500.0000 mg | ORAL_TABLET | Freq: Every day | ORAL | Status: DC
  Administered 2019-03-23: 11:00:00 500 mg via ORAL
  Filled 2019-03-22: qty 2

## 2019-03-22 MED ORDER — METHYLPREDNISOLONE SODIUM SUCC 40 MG IJ SOLR
40.0000 mg | Freq: Two times a day (BID) | INTRAMUSCULAR | Status: DC
  Administered 2019-03-22 – 2019-03-23 (×2): 40 mg via INTRAVENOUS
  Filled 2019-03-22 (×2): qty 1

## 2019-03-22 NOTE — Progress Notes (Signed)
PROGRESS NOTE    Scott [name]  [id number] DOB: September 22, 1940 DOA: 03/20/2019 PCP: Baxter Hire, MD      Brief Narrative:  Scott [name] is a 79 y.o. M with CAD last PCI >58yr, ischemic CM, sCHF EF 55% with chronic respiratory failure on 3L at baseline, PVD, HTN, and hx PE on Eliquis who presented with fever, cough, SOB for 1 week.  IN the ER, COVID PCR negative, CXR showed multifocal pneumonia.  Sats stable on home O2 (but had desaturated to 70s at home).       Assessment & Plan:   Acute on chronic hypoxic respiratory failure  Initially thought to be CAP or COVID, started on empiric antibiotics, remdesivir and steroids.  COVID PCR subsequently negative.  Remdesivir stopped.  Strep pneumo and legionella antigens negative, sputum culture pending.  Procalcitonins very low, and without leukocytosis or fever, Pulmonology were consulted to broaden differential.    At baseline, patient is on 3L O2, but interestingly, only in the last month.  No orthopnea or peripheral edema, but today desaturating substantially with minimal exertion.    Pulmonary hemorrhage, COVID fibrosis, atypical/fungal pneumonia are all being considered.  -Consult Cardiology -Hold ELiquis  -Continue ceftriaxone and azithromycin for now, day 3 of 5 -Continue pulmonary toilet -Follow urine Legionella and strep pneumo antigens -Follow blood culture and sputum culture  -Trend procalcitonin -Consult Pulmonology -Lasix x1   Atrial fibrillation, paroxysmal -Continue metoprolol -Hold Eliquis  Hypertension Coronary disease secondary prevention Peripheral vascular disease secondary prevention -Continue atorvastatin, Zetia -Continue Imdur, metoprolol, ranolazine  History of venous thromboembolism This was one time PE in 2018, can defer further anticoagulation.   -Hold Eliquis  Acute on chronic systolic CHF Appears euvolemic, not on diuretics normally, BNP low, EF previously <45%, has returned to normal  on last echo 2019. -IV Lasix -Strict I/Os, daily weights -BMP tomorrow -Obtain Echo -Consult Cardiology     Disposition: He is still having severe desaturations with minimal exertion and is severely dyspneic with walking just a few feet, nearly passing out.  Will consult Cardiology and follow echo.        MDM: The below labs and imaging reports reviewed and summarized above.  Medication management as above.    DVT prophylaxis: Not applicable, on Eliquis Code Status: Full code Family Communication: Wife at the bedside    Consultants:   Pulmonary  Procedures:   2/25 chest x-ray-multifocal pneumonia  Antimicrobials:   Ceftriaxone and azithromycin 2/25>>  Culture data:   2/25 blood culture x2-no growth to date  2/25 sputum culture-pending          Subjective: Patient extremely dyspneic with ambulation.  No confusion, fever, sputum.  Scant hemoptysis again.  No leg swelling.  Objective: Vitals:   03/22/19 0518 03/22/19 0825 03/22/19 0845 03/22/19 1209  BP: 104/62 116/67  101/65  Pulse: 72 65 68 88  Resp: 20 (!) 21 (!) 24   Temp: 97.7 F (36.5 C)   97.9 F (36.6 C)  TempSrc: Oral   Oral  SpO2: 92% 97% (!) 89% 97%  Weight:      Height:        Intake/Output Summary (Last 24 hours) at 03/22/2019 1533 Last data filed at 03/22/2019 1400 Gross per 24 hour  Intake 1432.38 ml  Output 1600 ml  Net -167.62 ml   Filed Weights   03/20/19 1051  Weight: 76.2 kg    Examination: General appearance: Elderly adult male, alert and in no acute distress.   HEENT:  Anicteric, conjunctiva pink, lids and lashes normal. No nasal deformity, discharge, epistaxis.  Lips moist, teeth normal. OP normal, no oral lesions.   Skin: Warm and dry.  No suspicious rashes or lesions. Cardiac: RRR, no murmurs appreciated.  No LE edema.    Respiratory: Normal respiratory rate and rhythm.  Rales at bilateral bases, worse on the left.   Abdomen: Abdomen soft.  No tenderness  palpation or guarding. No ascites, distension, hepatosplenomegaly.   MSK: No deformities or effusions of the large joints of the upper or lower extremities bilaterally. Neuro: Awake and alert. Naming is grossly intact, and the patient's recall, recent and remote, as well as general fund of knowledge seem within normal limits.  Muscle tone normal, without fasciculations.  Moves all extremities equally and with normal coordination.  Speech fluent.    Psych: Sensorium intact and responding to questions, attention normal. Affect normal.  Judgment and insight appear normal.      Data Reviewed: I have personally reviewed following labs and imaging studies:  CBC: Recent Labs  Lab 03/20/19 1105 03/22/19 0352  WBC 7.5 11.3*  HGB 14.1 11.9*  HCT 42.0 35.5*  MCV 95.5 94.9  PLT 315 [id number]   Basic Metabolic Panel: Recent Labs  Lab 03/20/19 1105 03/22/19 0352  NA 135 138  K 4.5 4.3  CL 98 107  CO2 27 25  GLUCOSE 137* 144*  BUN 18 28*  CREATININE 1.21 0.95  CALCIUM 8.7* 8.3*   GFR: Estimated Creatinine Clearance: 68.3 mL/min (by C-G formula based on SCr of 0.95 mg/dL). Liver Function Tests: Recent Labs  Lab 03/20/19 1146  AST 22  ALT 15  ALKPHOS 55  BILITOT 1.0  PROT 7.7  ALBUMIN 3.1*   No results for input(s): LIPASE, AMYLASE in the last 168 hours. No results for input(s): AMMONIA in the last 168 hours. Coagulation Profile: No results for input(s): INR, PROTIME in the last 168 hours. Cardiac Enzymes: No results for input(s): CKTOTAL, CKMB, CKMBINDEX, TROPONINI in the last 168 hours. BNP (last 3 results) No results for input(s): PROBNP in the last 8760 hours. HbA1C: No results for input(s): HGBA1C in the last 72 hours. CBG: No results for input(s): GLUCAP in the last 168 hours. Lipid Profile: No results for input(s): CHOL, HDL, LDLCALC, TRIG, CHOLHDL, LDLDIRECT in the last 72 hours. Thyroid Function Tests: No results for input(s): TSH, T4TOTAL, FREET4, T3FREE, THYROIDAB  in the last 72 hours. Anemia Panel: No results for input(s): VITAMINB12, FOLATE, FERRITIN, TIBC, IRON, RETICCTPCT in the last 72 hours. Urine analysis:    Component Value Date/Time   COLORURINE YELLOW (A) 12/08/2016 0938   APPEARANCEUR Hazy (A) 09/24/2018 1108   LABSPEC 1.015 12/08/2016 0938   PHURINE 6.0 12/08/2016 0938   GLUCOSEU Negative 09/24/2018 1108   HGBUR SMALL (A) 12/08/2016 0938   BILIRUBINUR Negative 09/24/2018 1108   KETONESUR NEGATIVE 12/08/2016 0938   PROTEINUR Negative 09/24/2018 1108   PROTEINUR NEGATIVE 12/08/2016 0938   NITRITE Negative 09/24/2018 1108   NITRITE NEGATIVE 12/08/2016 0938   LEUKOCYTESUR Negative 09/24/2018 1108   Sepsis Labs: @LABRCNTIP (procalcitonin:4,lacticacidven:4)  ) Recent Results (from the past 240 hour(s))  SARS CORONAVIRUS 2 (TAT 6-24 HRS) Nasopharyngeal Nasopharyngeal Swab     Status: None   Collection Time: 03/20/19  1:21 PM   Specimen: Nasopharyngeal Swab  Result Value Ref Range Status   SARS Coronavirus 2 NEGATIVE NEGATIVE Final    Comment: (NOTE) SARS-CoV-2 target nucleic acids are NOT DETECTED. The SARS-CoV-2 RNA is generally detectable in upper and  lower respiratory specimens during the acute phase of infection. Negative results do not preclude SARS-CoV-2 infection, do not rule out co-infections with other pathogens, and should not be used as the sole basis for treatment or other patient management decisions. Negative results must be combined with clinical observations, patient history, and epidemiological information. The expected result is Negative. Fact Sheet for Patients: SugarRoll.be Fact Sheet for Healthcare Providers: https://www.woods-mathews.com/ This test is not yet approved or cleared by the [place] FDA and  has been authorized for detection and/or diagnosis of SARS-CoV-2 by FDA under an Emergency Use Authorization (EUA). This EUA will remain  in effect (meaning  this test can be used) for the duration of the COVID-19 declaration under Section 56 4(b)(1) of the Act, 21 U.S.C. section 360bbb-3(b)(1), unless the authorization is terminated or revoked sooner. Performed at [facility], [place] [address] [place], [place] [address]   CULTURE, BLOOD (ROUTINE X 2) w Reflex to ID Panel     Status: None (Preliminary result)   Collection Time: 03/20/19  5:48 PM   Specimen: BLOOD  Result Value Ref Range Status   Specimen Description BLOOD BLOOD RIGHT HAND  Final   Special Requests   Final    BOTTLES DRAWN AEROBIC AND ANAEROBIC Blood Culture adequate volume   Culture   Final    NO GROWTH 2 DAYS Performed at [facility], [address] [place], [place] [address]    Report Status PENDING  Incomplete  CULTURE, BLOOD (ROUTINE X 2) w Reflex to ID Panel     Status: None (Preliminary result)   Collection Time: 03/20/19  5:48 PM   Specimen: BLOOD  Result Value Ref Range Status   Specimen Description BLOOD RIGHT ANTECUBITAL  Final   Special Requests   Final    BOTTLES DRAWN AEROBIC AND ANAEROBIC Blood Culture adequate volume   Culture   Final    NO GROWTH 2 DAYS Performed at [facility], [address] [place], [place] [address]    Report Status PENDING  Incomplete  Culture, sputum-assessment     Status: None   Collection Time: 03/20/19  9:45 PM   Specimen: Expectorated Sputum  Result Value Ref Range Status   Specimen Description EXPECTORATED SPUTUM  Final   Special Requests NONE  Final   Sputum evaluation   Final    THIS SPECIMEN IS ACCEPTABLE FOR SPUTUM CULTURE Performed at [facility], [address] [place], [place] [address]    Report Status 03/20/2019 FINAL  Final  Culture, respiratory     Status: None (Preliminary result)   Collection Time: 03/20/19  9:45 PM  Result Value Ref Range Status   Specimen Description   Final    EXPECTORATED SPUTUM Performed at [facility], [address]  [place], [place] [address]    Special Requests   Final    NONE Reflexed from [phone] Performed at [facility], [address] [place], [place] [address]    Gram Stain   Final    MODERATE WBC PRESENT,BOTH PMN AND MONONUCLEAR MODERATE GRAM POSITIVE COCCI MODERATE GRAM NEGATIVE RODS FEW GRAM VARIABLE ROD FEW SQUAMOUS EPITHELIAL CELLS PRESENT    Culture   Final    CULTURE REINCUBATED FOR BETTER GROWTH Performed at [facility], [place] [address] [place], [place] [address]    Report Status PENDING  Incomplete  Expectorated sputum assessment w rflx to resp cult     Status: None   Collection Time: 03/21/19  7:21 PM   Specimen: Sputum  Result Value Ref Range Status   Specimen Description SPU  Final   Special Requests Normal  Final   Sputum evaluation   Final    Sputum specimen not acceptable for testing.  Please recollect.   NOTIFIED MARSHA [facility] 03/21/19 AT 2027 HS Performed at [facility], [address] [place], [place] [address]    Report Status 03/21/2019 FINAL  Final         Radiology Studies: ECHOCARDIOGRAM COMPLETE  Result Date: 03/22/2019    ECHOCARDIOGRAM REPORT   Patient Name:   Scott [name] Date of Exam: 03/21/2019 Medical Rec #:  [id number]      Height:       70.0 in Accession #:    [id number]     Weight:       168.0 lb Date of Birth:  Jul 20, 1940       BSA:          1.938 m Patient Age:    58 years       BP:           113/59 mmHg Patient Gender: M              HR:           81 bpm. Exam Location:  [facility] Procedure: 2D Echo Indications:     Mitral Valve Insufficiency 424.0/134.0  History:         Patient has prior history of Echocardiogram examinations, most                  recent 10/02/2017. Prior CABG, Signs/Symptoms:Shortness of                  Breath and Chest Pain; Risk Factors:Dyslipidemia.  Sonographer:     Avanell Shackleton Referring Phys:  [id number] Ottie Glazier Diagnosing Phys: Isaias Cowman MD  Sonographer Comments: Technically difficult study  due to poor echo windows. IMPRESSIONS  1. Left ventricular ejection fraction, by estimation, is 40 to 45%. The left ventricle has mildly decreased function. The left ventricle has no regional wall motion abnormalities. Left ventricular diastolic parameters are consistent with Grade I diastolic dysfunction (impaired relaxation).  2. Right ventricular systolic function is normal. The right ventricular size is normal.  3. The mitral valve is normal in structure and function. Mild mitral valve regurgitation. No evidence of mitral stenosis.  4. The aortic valve is normal in structure and function. Aortic valve regurgitation is not visualized. No aortic stenosis is present.  5. The inferior vena cava is normal in size with greater than 50% respiratory variability, suggesting right atrial pressure of 3 mmHg. FINDINGS  Left Ventricle: Left ventricular ejection fraction, by estimation, is 40 to 45%. The left ventricle has mildly decreased function. The left ventricle has no regional wall motion abnormalities. The left ventricular internal cavity size was normal in size. There is no left ventricular hypertrophy. Left ventricular diastolic parameters are consistent with Grade I diastolic dysfunction (impaired relaxation). Right Ventricle: The right ventricular size is normal. No increase in right ventricular wall thickness. Right ventricular systolic function is normal. Left Atrium: Left atrial size was normal in size. Right Atrium: Right atrial size was normal in size. Pericardium: There is no evidence of pericardial effusion. Mitral Valve: The mitral valve is normal in structure and function. Normal mobility of the mitral valve leaflets. Mild mitral valve regurgitation. No evidence of mitral valve stenosis. Tricuspid Valve: The tricuspid valve is normal in structure.  Tricuspid valve regurgitation is mild . No evidence of tricuspid stenosis. Aortic Valve: The aortic valve is normal in structure and function. Aortic valve  regurgitation is not visualized. No aortic stenosis is present. Pulmonic Valve: The pulmonic valve was normal in structure. Pulmonic valve regurgitation is not visualized. No evidence of pulmonic stenosis. Aorta: The aortic root is normal in size and structure. Venous: The inferior vena cava is normal in size with greater than 50% respiratory variability, suggesting right atrial pressure of 3 mmHg. IAS/Shunts: No atrial level shunt detected by color flow Doppler.  LEFT VENTRICLE PLAX 2D LVIDd:         5.33 cm      Diastology LVIDs:         4.54 cm      LV e' lateral:   11.10 cm/s LV PW:         0.90 cm      LV E/e' lateral: 4.8 LV IVS:        1.01 cm      LV e' medial:    6.96 cm/s LVOT diam:     1.80 cm      LV E/e' medial:  7.7 LVOT Area:     2.54 cm  LV Volumes (MOD) LV vol d, MOD A2C: 148.0 ml LV vol d, MOD A4C: 141.0 ml LV vol s, MOD A2C: 88.0 ml LV vol s, MOD A4C: 85.7 ml LV SV MOD A2C:     60.0 ml LV SV MOD A4C:     141.0 ml LV SV MOD BP:      59.4 ml RIGHT VENTRICLE            IVC RV S prime:     8.27 cm/s  IVC diam: 1.98 cm LEFT ATRIUM             Index LA diam:        4.70 cm 2.42 cm/m LA Vol (A2C):   42.7 ml 22.03 ml/m LA Vol (A4C):   46.0 ml 23.73 ml/m LA Biplane Vol: 45.4 ml 23.42 ml/m   AORTA Ao Root diam: 3.30 cm MITRAL VALVE MV Area (PHT): 3.21 cm    SHUNTS MV Decel Time: 236 msec    Systemic Diam: 1.80 cm MV E velocity: 53.80 cm/s MV A velocity: 84.90 cm/s MV E/A ratio:  0.63 Isaias Cowman MD Electronically signed by Isaias Cowman MD Signature Date/Time: 03/22/2019/12:37:17 PM    Final         Scheduled Meds: . vitamin C  500 mg Oral Daily  . atorvastatin  80 mg Oral q1800  . [START ON 03/23/2019] azithromycin  500 mg Oral Daily  . dextromethorphan-guaiFENesin  1 tablet Oral BID  . ezetimibe  10 mg Oral Daily  . feeding supplement (ENSURE ENLIVE)  237 mL Oral BID BM  . ipratropium-albuterol  3 mL Nebulization TID  . isosorbide mononitrate  60 mg Oral BID  . metoprolol  succinate  25 mg Oral QPM  . mometasone-formoterol  2 puff Inhalation BID  . ranolazine  1,000 mg Oral BID  . sodium chloride flush  3 mL Intravenous Once  . zinc sulfate  220 mg Oral Daily   Continuous Infusions: . sodium chloride 5 mL/hr at 03/22/19 0935  . cefTRIAXone (ROCEPHIN)  IV Stopped (03/22/19 0900)     LOS: 2 days    Time spent: 25 minutes    Edwin Dada, MD [facility] 03/22/2019, 3:33 PM     Please page though  AMION or Epic secure chat:  For [employer], Adult nurse

## 2019-03-22 NOTE — Evaluation (Addendum)
Physical Therapy Evaluation Patient Details Name: Scott [name] MRN: [id number] DOB: [birth year] Today's Date: 03/22/2019   History of Present Illness  Scott [name] is a 79 y.o. M with CAD last PCI >18yr, ischemic CM, sCHF EF 55% with chronic respiratory failure on 3L at baseline, PVD, HTN, and hx PE on Eliquis who presented with fever, cough, SOB for 1 week. Patient reports over the past 6 weeks his pulmonologist placed him on 6L of O2 and he has been using a RW at home for stability since, becauase he gets "dizzy" with his SOB  Clinical Impression  PT session limited d/t patient O2 levels with minimal exertion. Patient needing to use restroom on PT arrival, desatting to 73% following supine>sit>stand with RW. Patient able to comply with all cuing for RW sequencing and bed mobility, but ultilimately returned to seated position in order to return O2 levels to normal: 28mins of pursed lip breathing needed on 5L. Chair <> commode transfer completed with O2 de-sat 80% with O2 on 5L. PT completed cleaning in order for patient ot focus on PLB, and patient is able to complete sit > supine with ease. PT waited for O2 levels to resume to 93% before decreasing O2 to 4L with sats monitored >90%. Nursing aware. Would benefit from skilled PT to address above deficits and promote optimal return to PLOF.     Follow Up Recommendations SNF    Equipment Recommendations  Rolling walker with 5" wheels    Recommendations for Other Services       Precautions / Restrictions Precautions Precautions: Fall Restrictions Weight Bearing Restrictions: No      Mobility  Bed Mobility Overal bed mobility: Needs Assistance Bed Mobility: Sidelying to Sit;Supine to Sit;Sit to Supine   Sidelying to sit: Modified independent (Device/Increase time) Supine to sit: Modified independent (Device/Increase time) Sit to supine: Modified independent (Device/Increase time)   General bed mobility comments: Increased time, use of  bedrails  Transfers Overall transfer level: Needs assistance Equipment used: Rolling walker (2 wheeled) Transfers: Sit to/from [facility] Sit to Stand: Modified independent (Device/Increase time) Stand pivot transfers: Modified independent (Device/Increase time)       General transfer comment: Cuing needed for set up and sequencing with RW for STS and chair <> commode transfer  Ambulation/Gait Ambulation/Gait assistance: Modified independent (Device/Increase time) Gait Distance (Feet): 2 Feet Assistive device: Rolling walker (2 wheeled)   Gait velocity: decreased   General Gait Details: shuffle chair to chair  Stairs            Wheelchair Mobility    Modified Rankin (Stroke Patients Only)       Balance Overall balance assessment: Needs assistance   Sitting balance-Leahy Scale: Good       Standing balance-Leahy Scale: Poor                               Pertinent Vitals/Pain Pain Assessment: No/denies pain    Home Living Family/patient expects to be discharged to:: Private residence Living Arrangements: Spouse/significant other Available Help at Discharge: Family Type of Home: House Home Access: Level entry     Home Layout: One level Home Equipment: Environmental consultant - 2 wheels;Grab bars - tub/shower;Grab bars - toilet Additional Comments: Has a RW that he has been using for past 6 weeks d/t SOB    Prior Function Level of Independence: Independent         Comments: Patient reports indep without AD  for ADLs and mobility. 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     Hand Dominance   Dominant Hand: Right    Extremity/Trunk Assessment   Upper Extremity Assessment Upper Extremity Assessment: Overall WFL for tasks assessed    Lower Extremity Assessment Lower Extremity Assessment: Overall WFL for tasks assessed    Cervical / Trunk Assessment Cervical / Trunk Assessment: Lordotic   Communication   Communication: No difficulties  Cognition Arousal/Alertness: Awake/alert Behavior During Therapy: WFL for tasks assessed/performed Overall Cognitive Status: Within Functional Limits for tasks assessed                                        General Comments      Exercises Other Exercises Other Exercises: Supine <> sit modI with cuing for handplacement and O2 monitored without desat Other Exercises: Sit > stand with RW with cuing prior for safety and set up; following STS desat to 73%, returned to sitting cued through PLB which patient needs 33mins of eduaction to complete with accuracy to raise O2 levels with O2 on 5L to 90% Other Exercises: Chair <> commode transfer modI with cuing for set up and sequencing, PT completed toileting so that patient could focus on breath control with O2 dropping to 80% during BM but is able to return >90% following PLB in sitting before returning to bed   Assessment/Plan    PT Assessment Patient needs continued PT services  PT Problem List Decreased strength;Decreased range of motion;Decreased activity tolerance;Decreased balance;Decreased mobility;Decreased coordination;Cardiopulmonary status limiting activity       PT Treatment Interventions DME instruction;Therapeutic activities;Gait training;Therapeutic exercise;Patient/family education;Stair training;Balance training;Functional mobility training;Neuromuscular re-education;Manual techniques    PT Goals (Current goals can be found in the Care Plan section)  Acute Rehab PT Goals Patient Stated Goal: Return home PT Goal Formulation: With patient Time For Goal Achievement: 04/05/19 Potential to Achieve Goals: Fair    Frequency Min 2X/week   Barriers to discharge Decreased caregiver support O2 levels    Co-evaluation               AM-PAC PT "6 Clicks" Mobility  Outcome Measure Help needed turning from your back to your side while in a flat bed without  using bedrails?: A Little Help needed moving from lying on your back to sitting on the side of a flat bed without using bedrails?: A Little Help needed moving to and from a bed to a chair (including a wheelchair)?: A Little Help needed standing up from a chair using your arms (e.g., wheelchair or bedside chair)?: A Little Help needed to walk in hospital room?: Total Help needed climbing 3-5 steps with a railing? : Total 6 Click Score: 14    End of Session Equipment Utilized During Treatment: Gait belt Activity Tolerance: Patient limited by fatigue;Treatment limited secondary to medical complications (Comment) Patient left: in bed;with call bell/phone within reach;with bed alarm set Nurse Communication: Mobility status;Precautions PT Visit Diagnosis: Unsteadiness on feet (R26.81);Other abnormalities of gait and mobility (R26.89);Muscle weakness (generalized) (M62.81)    Time: [id number] PT Time Calculation (min) (ACUTE ONLY): 32 min   Charges:   PT Evaluation $PT Eval Moderate Complexity: 1 Mod PT Treatments $Therapeutic Activity: 23-37 mins        Shelton Silvas PT, DPT Shelton Silvas 03/22/2019, 10:48 AM

## 2019-03-22 NOTE — Consult Note (Signed)
Pulmonary Medicine          Date: 03/22/2019,   MRN# [id number] Scott [name] 06/04/1940  AdmissionWeight: 76.2 kg                 CurrentWeight: 76.2 kg  Referring physician: Dr. Loleta Books    CHIEF COMPLAINT:   Acute on chronic hypoxemic respiratory failure   SUBJECTIVE   Patient sitting up in bed with wife at bedside.    He is still hypoxemic with spO2 dropping to 83% on 4L [place] when using commode to urinate.     I have discussed his case with radiology to review imaging and cardiology today - unusual case overall.  Plan is to hold anticoagulation with possibly switch to coumadin to confirm therapeutic level.   Patient may still need bronchoscopy with airway inspection and BAL to rule out indolent atypical infection but we will wait for few weeks to evaluate level of improvement post AC change.  He coughed up several times with dry clots today which also points to alveolar hemmorhage as etiology of current acute hypoxemic episode.     PAST MEDICAL HISTORY   Past Medical History:  Diagnosis Date  . Arthritis    right hip, since a fall  . CAD (coronary artery disease)    a. 04/2012 Cath: 3VD->Med Rx;  b. 04/2013 PCI RCA (2 DES); c. 03/2014 PCI: LAD 80 (3.0x23 Xience Alpine DES); d. 06/2014 CABG x 2 ([facility]) LIMA->LAD, VG->OM; e. 12/2014 Cath ([facility]): patent grafts->Med Rx; f. 08/2015 Cath: patent grafts; g. 11/2016 Cath: LM min irregs, LAD 20p, 26m, LCX 100p/m, RCA 20ost, 10p/m/d, VG->OM1 nl, LIMA->dLAD nl; f. 07/2017 MV: No isch, EF 51%.  . Cardiomyopathy, ischemic    a. 04/2012 Echo: EF 40-45%;  b. 08/2013 Echo: EF 45-50%; c. 01/2015 Echo: EF 40-45%; d. 07/2016 Echo: EF 60-65%; e. 09/2017 Echo: EF 55-60%, Gr1 DD.  . Carotid arterial disease ([place])    a. 05/2013 Carotid U/S; bilat 40-50% ICA stenosis.  . Chronic Chest Pain    USES NITRO  . Chronic systolic CHF (congestive heart failure) ([place])    a. 01/2015 Echo: EF 40-45%; b. 07/2016 Echo: EF 60-65%; c. 09/2017 Echo: EF 55-60%, Gr1 DD,  nl RV fxn.  . Cough   . Diverticulosis   . Dizziness   . Dyspnea    WITH EXERTION  . Headache 1970's   migraine  . History of blood transfusion 2016   post op  . Hyperlipidemia   . Hypertension   . Iron deficiency anemia due to chronic blood loss 09/11/2016  . Myocardial infarction [facility])    unsure of when  . Pulmonary emboli ([place]) 07/2016   a. On Xarelto.  . Reflux esophagitis   . Sternal pain    a. 03/2016 s/p Sternal wire removal; b. 05/2016 s/p redo median sternotomy for sternal debridement and sternal plating.  . Syncope 04/2018   CARDIOLOGIST WAS DR Rockey Situ AND HE WAS AWARE-NOW PT SEES PARASCHOS  . Tubular adenoma of colon      SURGICAL HISTORY   Past Surgical History:  Procedure Laterality Date  . CARDIAC CATHETERIZATION  05/01/2012  . CARDIAC CATHETERIZATION  04/2013   [facility];x3 stent  . CARDIAC CATHETERIZATION  01/11/15    [facility]  . CARDIAC CATHETERIZATION N/A 09/16/2015   Procedure: LEFT HEART CATH AND CORS/GRAFTS ANGIOGRAPHY;  Surgeon: Minna Merritts, MD;  Location: [place] CV LAB;  Service: Cardiovascular;  Laterality: N/A;  . CARPAL TUNNEL RELEASE  right hand  . CATARACT EXTRACTION     LEFT  . CATARACT EXTRACTION W/PHACO Left 09/16/2014   Procedure: CATARACT EXTRACTION PHACO AND INTRAOCULAR LENS PLACEMENT (IOC);  Surgeon: Leandrew Koyanagi, MD;  Location: [place];  Service: Ophthalmology;  Laterality: Left;  . COLONOSCOPY    . COLONOSCOPY WITH PROPOFOL N/A 01/28/2016   Procedure: COLONOSCOPY WITH PROPOFOL;  Surgeon: Lollie Sails, MD;  Location: [facility] ENDOSCOPY;  Service: Endoscopy;  Laterality: N/A;  . CORONARY ANGIOPLASTY WITH STENT PLACEMENT  04/13/2014  . CORONARY ARTERY BYPASS GRAFT  06-26-14   x3 bypasses  . CORONARY PRESSURE WIRE/FFR WITH 3D MAPPING N/A 12/10/2018   Procedure: Suzette Battiest PRESSURE WIRE/FFR STUDY;  Surgeon: Isaias Cowman, MD;  Location: [place] CV [facility];  Service: Cardiovascular;  Laterality: N/A;  .  CYSTOSCOPY N/A 08/14/2018   Procedure: CYSTOSCOPY;  Surgeon: Abbie Sons, MD;  Location: [facility] ORS;  Service: Urology;  Laterality: N/A;  . DE QUERVAIN'S RELEASE Left 08/22/2012  . ESOPHAGOGASTRODUODENOSCOPY (EGD) WITH PROPOFOL N/A 04/26/2015   Procedure: ESOPHAGOGASTRODUODENOSCOPY (EGD) WITH PROPOFOL;  Surgeon: Hulen Luster, MD;  Location: [facility] ENDOSCOPY;  Service: Gastroenterology;  Laterality: N/A;  . ESOPHAGOGASTRODUODENOSCOPY (EGD) WITH PROPOFOL N/A 05/29/2017   Procedure: ESOPHAGOGASTRODUODENOSCOPY (EGD) WITH PROPOFOL;  Surgeon: Lollie Sails, MD;  Location: [facility] ENDOSCOPY;  Service: Endoscopy;  Laterality: N/A;  . INTRAVASCULAR ULTRASOUND/IVUS N/A 12/10/2018   Procedure: Intravascular Ultrasound/IVUS;  Surgeon: Isaias Cowman, MD;  Location: [place] CV LAB;  Service: Cardiovascular;  Laterality: N/A;  . LEFT HEART CATH AND CORONARY ANGIOGRAPHY N/A 12/04/2016   Procedure: LEFT HEART CATH AND CORS/GRAFTS  ANGIOGRAPHY;  Surgeon: Wellington Hampshire, MD;  Location: [place] CV LAB;  Service: Cardiovascular;  Laterality: N/A;  . LEFT HEART CATH AND CORS/GRAFTS ANGIOGRAPHY N/A 04/09/2018   Procedure: LEFT HEART CATH AND CORS/GRAFTS ANGIOGRAPHY;  Surgeon: Isaias Cowman, MD;  Location: [place] [facility];  Service: Cardiovascular;  Laterality: N/A;  . LEFT HEART CATH AND CORS/GRAFTS ANGIOGRAPHY N/A 12/10/2018   Procedure: LEFT HEART CATH AND CORS/GRAFTS ANGIOGRAPHY;  Surgeon: Isaias Cowman, MD;  Location: [place] CV LAB;  Service: Cardiovascular;  Laterality: N/A;  . RIB PLATING N/A 05/25/2016   Procedure: STERNAL PLATING;  Surgeon: Melrose Nakayama, MD;  Location: [place];  Service: Thoracic;  Laterality: N/A;  . right shoulder    . STERNAL WIRES REMOVAL N/A 03/30/2016   Procedure: STERNAL WIRES REMOVAL;  Surgeon: Melrose Nakayama, MD;  Location: [place];  Service: Thoracic;  Laterality: N/A;  . TONSILLECTOMY       FAMILY HISTORY   Family History   Problem Relation Age of Onset  . Heart attack Father 67       MI  . Cancer Maternal Uncle      SOCIAL HISTORY   Social History   Tobacco Use  . Smoking status: Former Smoker    Packs/day: 1.00    Years: 35.00    Pack years: 35.00    Types: Cigarettes    Start date: 05/02/2009  . Smokeless tobacco: Former Network engineer Use Topics  . Alcohol use: No  . Drug use: No     MEDICATIONS    Home Medication:    Current Medication:  Current Facility-Administered Medications:  .  0.9 %  sodium chloride infusion, , Intravenous, PRN, Ivor Costa, MD, Last Rate: 5 mL/hr at 03/22/19 0935, Rate Verify at 03/22/19 0935 .  acetaminophen (TYLENOL) tablet 650 mg, 650 mg, Oral, Q6H PRN, Ivor Costa, MD, 650 mg  at 03/22/19 1052 .  albuterol (PROVENTIL) (2.5 MG/3ML) 0.083% nebulizer solution 2.5 mg, 2.5 mg, Nebulization, Q4H PRN, Danford, Suann Larry, MD .  ascorbic acid (VITAMIN C) tablet 500 mg, 500 mg, Oral, Daily, Ivor Costa, MD, 500 mg at 03/22/19 0825 .  atorvastatin (LIPITOR) tablet 80 mg, 80 mg, Oral, q1800, Ivor Costa, MD, 80 mg at 03/21/19 1919 .  azithromycin (ZITHROMAX) 500 mg in sodium chloride 0.9 % 250 mL IVPB, 500 mg, Intravenous, Daily, Ivor Costa, MD, Last Rate: 250 mL/hr at 03/22/19 1032, 500 mg at 03/22/19 1032 .  cefTRIAXone (ROCEPHIN) 2 g in sodium chloride 0.9 % 100 mL IVPB, 2 g, Intravenous, Daily, Ivor Costa, MD, Stopped at 03/22/19 0900 .  dextromethorphan-guaiFENesin (MUCINEX DM) 30-600 MG per 12 hr tablet 1 tablet, 1 tablet, Oral, BID, Ivor Costa, MD, 1 tablet at 03/22/19 [phone]  ezetimibe (ZETIA) tablet 10 mg, 10 mg, Oral, Daily, Ivor Costa, MD, 10 mg at 03/22/19 0824 .  feeding supplement (ENSURE ENLIVE) (ENSURE ENLIVE) liquid 237 mL, 237 mL, Oral, BID BM, Danford, Suann Larry, MD, 237 mL at 03/22/19 1001 .  hydrALAZINE (APRESOLINE) tablet 25 mg, 25 mg, Oral, TID PRN, Ivor Costa, MD .  ipratropium-albuterol (DUONEB) 0.5-2.5 (3) MG/3ML nebulizer solution 3 mL, 3  mL, Nebulization, TID, Danford, Suann Larry, MD, 3 mL at 03/22/19 0845 .  isosorbide mononitrate (IMDUR) 24 hr tablet 60 mg, 60 mg, Oral, BID, Ivor Costa, MD, 60 mg at 03/22/19 0824 .  metoprolol succinate (TOPROL-XL) 24 hr tablet 25 mg, 25 mg, Oral, QPM, Ivor Costa, MD, 25 mg at 03/21/19 1920 .  mometasone-formoterol (DULERA) 100-5 MCG/ACT inhaler 2 puff, 2 puff, Inhalation, BID, Ottie Glazier, MD, 2 puff at 03/22/19 0827 .  nitroGLYCERIN (NITROSTAT) SL tablet 0.4 mg, 0.4 mg, Sublingual, Q5 min PRN, Ivor Costa, MD .  ondansetron [facility]) injection 4 mg, 4 mg, Intravenous, Q8H PRN, Ivor Costa, MD .  ranolazine (RANEXA) 12 hr tablet 1,000 mg, 1,000 mg, Oral, BID, Ivor Costa, MD, 1,000 mg at 03/22/19 [id number] .  sodium chloride flush (NS) 0.9 % injection 3 mL, 3 mL, Intravenous, Once, Ivor Costa, MD .  zinc sulfate capsule 220 mg, 220 mg, Oral, Daily, Ivor Costa, MD, 220 mg at 03/22/19 [id number]    ALLERGIES   Contrast media [iodinated diagnostic agents], Metrizamide, Oxycodone hcl, and Vicodin [hydrocodone-acetaminophen]     REVIEW OF SYSTEMS    Review of Systems:  Gen:  Denies  fever, sweats, chills weigh loss  HEENT: Denies blurred vision, double vision, ear pain, eye pain, hearing loss, nose bleeds, sore throat Cardiac:  No dizziness, chest pain or heaviness, chest tightness,edema Resp:   Denies cough or sputum porduction, shortness of breath,wheezing, hemoptysis,  Gi: Denies swallowing difficulty, stomach pain, nausea or vomiting, diarrhea, constipation, bowel incontinence Gu:  Denies bladder incontinence, burning urine Ext:   Denies Joint pain, stiffness or swelling Skin: Denies  skin rash, easy bruising or bleeding or hives Endoc:  Denies polyuria, polydipsia , polyphagia or weight change Psych:   Denies depression, insomnia or hallucinations   Other:  All other systems negative   VS: BP 101/65 (BP Location: Left Arm)   Pulse 88   Temp 97.9 F (36.6 C) (Oral)   Resp (!) 24    Ht 5\' 10"  (1.778 m)   Wt 76.2 kg   SpO2 97%   BMI 24.11 kg/m      PHYSICAL EXAM    GENERAL:NAD, no fevers, chills, no weakness no fatigue HEAD: Normocephalic, atraumatic.  EYES: Pupils equal, round, reactive to light. Extraocular muscles intact. No scleral icterus.  MOUTH: Moist mucosal membrane. Dentition intact. No abscess noted.  EAR, NOSE, THROAT: Clear without exudates. No external lesions.  NECK: Supple. No thyromegaly. No nodules. No JVD.  PULMONARY: Decreased breath sounds bilaterally without adventitious lung sounds CARDIOVASCULAR: S1 and S2. Regular rate and rhythm. No murmurs, rubs, or gallops. No edema. Pedal pulses 2+ bilaterally.  GASTROINTESTINAL: Soft, nontender, nondistended. No masses. Positive bowel sounds. No hepatosplenomegaly.  MUSCULOSKELETAL: No swelling, clubbing, or edema. Range of motion full in all extremities.  NEUROLOGIC: Cranial nerves II through XII are intact. No gross focal neurological deficits. Sensation intact. Reflexes intact.  SKIN: No ulceration, lesions, rashes, or cyanosis. Skin warm and dry. Turgor intact.  PSYCHIATRIC: Mood, affect within normal limits. The patient is awake, alert and oriented x 3. Insight, judgment intact.       IMAGING    MR ANGIO NECK W WO CONTRAST  Result Date: 02/24/2019 CLINICAL DATA:  Dizziness over the last 3-5 weeks. Balance disturbance. Occluded carotid artery. EXAM: MRA NECK WITHOUT AND WITH CONTRAST TECHNIQUE: Multiplanar and multiecho pulse sequences of the neck were obtained without and with intravenous contrast. Angiographic images of the neck were obtained using MRA technique without and with intravenous contrast. CONTRAST:  59mL GADAVIST GADOBUTROL 1 MMOL/ML IV SOLN COMPARISON:  02/17/2019.  02/18/2019 FINDINGS: Branching pattern of the arch is normal. No origin stenoses. Both common carotid arteries are widely patent to their respective bifurcation. On the right, there is mild atherosclerotic irregularity  of the ICA bulb but no stenosis. Cervical ICA widely patent beyond that. On the left, there is mild atherosclerotic irregularity in the ICA bulb but no stenosis. Cervical ICA widely patent beyond that. Both vertebral artery origins are patent. The left vertebral artery is dominant. Both vertebral arteries appear normal through the cervical region to the foramen magnum. The examination also includes the majority of the intracranial circulation. No anterior circulation stenosis or occlusion is seen. No basilar stenosis. Posterior circulation branch vessels show flow. Right PCA takes fetal origin the anterior circulation. IMPRESSION: Mild atherosclerotic irregularity of both ICA bulb regions but no carotid stenosis. No evidence of occlusion or significant focal vascular lesion. These results will be called to the ordering clinician or representative by the Radiologist Assistant, and communication documented in the PACS or zVision Dashboard. Electronically Signed   By: Nelson Chimes M.D.   On: 02/24/2019 12:30   DG Chest Port 1 View  Result Date: 03/20/2019 CLINICAL DATA:  Chest pain and shortness of breath. EXAM: PORTABLE CHEST 1 VIEW COMPARISON:  01/05/2019 FINDINGS: 11:19 a.m. Low volume film. Patchy peripheral airspace opacity noted bilaterally, right greater than left. Cardiopericardial silhouette is at upper limits of normal for size. No substantial pleural effusion. Sternal fusion hardware evident. Telemetry leads overlie the chest. IMPRESSION: Right greater than left patchy bilateral ground-glass airspace disease, peripherally predominant. Imaging features suggest multifocal pneumonia and atypical etiology would be a consideration. Electronically Signed   By: Misty Stanley M.D.   On: 03/20/2019 11:34   ECHOCARDIOGRAM COMPLETE  Result Date: 03/22/2019    ECHOCARDIOGRAM REPORT   Patient Name:   Scott [name] Date of Exam: 03/21/2019 Medical Rec #:  [id number]      Height:       70.0 in Accession #:     [id number]     Weight:       168.0 lb Date of Birth:  December 24, 1940  BSA:          1.938 m Patient Age:    34 years       BP:           113/59 mmHg Patient Gender: M              HR:           81 bpm. Exam Location:  [facility] Procedure: 2D Echo Indications:     Mitral Valve Insufficiency 424.0/134.0  History:         Patient has prior history of Echocardiogram examinations, most                  recent 10/02/2017. Prior CABG, Signs/Symptoms:Shortness of                  Breath and Chest Pain; Risk Factors:Dyslipidemia.  Sonographer:     Avanell Shackleton Referring Phys:  [id number] Ottie Glazier Diagnosing Phys: Isaias Cowman MD  Sonographer Comments: Technically difficult study due to poor echo windows. IMPRESSIONS  1. Left ventricular ejection fraction, by estimation, is 40 to 45%. The left ventricle has mildly decreased function. The left ventricle has no regional wall motion abnormalities. Left ventricular diastolic parameters are consistent with Grade I diastolic dysfunction (impaired relaxation).  2. Right ventricular systolic function is normal. The right ventricular size is normal.  3. The mitral valve is normal in structure and function. Mild mitral valve regurgitation. No evidence of mitral stenosis.  4. The aortic valve is normal in structure and function. Aortic valve regurgitation is not visualized. No aortic stenosis is present.  5. The inferior vena cava is normal in size with greater than 50% respiratory variability, suggesting right atrial pressure of 3 mmHg. FINDINGS  Left Ventricle: Left ventricular ejection fraction, by estimation, is 40 to 45%. The left ventricle has mildly decreased function. The left ventricle has no regional wall motion abnormalities. The left ventricular internal cavity size was normal in size. There is no left ventricular hypertrophy. Left ventricular diastolic parameters are consistent with Grade I diastolic dysfunction (impaired relaxation). Right Ventricle: The right  ventricular size is normal. No increase in right ventricular wall thickness. Right ventricular systolic function is normal. Left Atrium: Left atrial size was normal in size. Right Atrium: Right atrial size was normal in size. Pericardium: There is no evidence of pericardial effusion. Mitral Valve: The mitral valve is normal in structure and function. Normal mobility of the mitral valve leaflets. Mild mitral valve regurgitation. No evidence of mitral valve stenosis. Tricuspid Valve: The tricuspid valve is normal in structure. Tricuspid valve regurgitation is mild . No evidence of tricuspid stenosis. Aortic Valve: The aortic valve is normal in structure and function. Aortic valve regurgitation is not visualized. No aortic stenosis is present. Pulmonic Valve: The pulmonic valve was normal in structure. Pulmonic valve regurgitation is not visualized. No evidence of pulmonic stenosis. Aorta: The aortic root is normal in size and structure. Venous: The inferior vena cava is normal in size with greater than 50% respiratory variability, suggesting right atrial pressure of 3 mmHg. IAS/Shunts: No atrial level shunt detected by color flow Doppler.  LEFT VENTRICLE PLAX 2D LVIDd:         5.33 cm      Diastology LVIDs:         4.54 cm      LV e' lateral:   11.10 cm/s LV PW:         0.90 cm      LV  E/e' lateral: 4.8 LV IVS:        1.01 cm      LV e' medial:    6.96 cm/s LVOT diam:     1.80 cm      LV E/e' medial:  7.7 LVOT Area:     2.54 cm  LV Volumes (MOD) LV vol d, MOD A2C: 148.0 ml LV vol d, MOD A4C: 141.0 ml LV vol s, MOD A2C: 88.0 ml LV vol s, MOD A4C: 85.7 ml LV SV MOD A2C:     60.0 ml LV SV MOD A4C:     141.0 ml LV SV MOD BP:      59.4 ml RIGHT VENTRICLE            IVC RV S prime:     8.27 cm/s  IVC diam: 1.98 cm LEFT ATRIUM             Index LA diam:        4.70 cm 2.42 cm/m LA Vol (A2C):   42.7 ml 22.03 ml/m LA Vol (A4C):   46.0 ml 23.73 ml/m LA Biplane Vol: 45.4 ml 23.42 ml/m   AORTA Ao Root diam: 3.30 cm MITRAL  VALVE MV Area (PHT): 3.21 cm    SHUNTS MV Decel Time: 236 msec    Systemic Diam: 1.80 cm MV E velocity: 53.80 cm/s MV A velocity: 84.90 cm/s MV E/A ratio:  0.63 Isaias Cowman MD Electronically signed by Isaias Cowman MD Signature Date/Time: 03/22/2019/12:37:17 PM    Final              ASSESSMENT/PLAN   Acute on chronic hypoxemic respiratory failure -Clinical presentation less likely infectious due to absence of fevers, leukocytosis, significant abnormalities in blood pressure or vital signs sites hypoxemia -Septic work-up has been done and is in process -Agree with empiric community-acquired pneumonia therapy currently on Zithromax Rocephin -possibly related to mitral valve regurgitation and anticoagulation with resultant pulmonary hemorrhage - will hold Eliquis at this time and have discussed case with Dr Saralyn Pilar - transthoracic echo has been repeated and report in progress -Complicated cardiac history including CABG, multivessel CAD, PAF, mitral valve regurgitation,  CHF with reduced EF -Patient may need bronchoscopy when in chronic stable state on outpatient basis    Non-massive hemoptysis -Patient's had recurrent low volume hemoptysis in the past and recently last month which has self resolved    History of pulmonary embolism -Patient had left upper lobe segmental PE in 2018 which tracked all the way up to left main PA, repeat CT chest showed resolution of clot burden. 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   Patient/Family are satisfied with care plan and all questions have been answered.  This document was prepared using Dragon voice recognition software and may include unintentional dictation errors.     Ottie Glazier, M.D.  Division of  [place]

## 2019-03-22 NOTE — Progress Notes (Signed)
Around 18:20  , pt c/o"not feeling good" and " short of breath" while at rest. Respiration was 25, BP was 99/61. See flow sheet.  MD was notified and ordered solumedrol and duoneb for breathing treatment. New orders administered. Night Rn made aware.

## 2019-03-22 NOTE — Consult Note (Signed)
[name]  CARDIOLOGY CONSULT NOTE  Patient ID: Scott [name] MRN: [id number] DOB/AGE: 1940/06/08 79 y.o.  Admit date: 03/20/2019 Referring Physician [name] Primary Physician [name] Primary Cardiologist [name] Reason for Consultation atrial fibrillation, coronary artery disease  HPI: 79 year old gentleman referred to manage anticoagulation for atrial fibrillation in the setting of hemoptysis, pulmonary hemorrhage.  The patient has known coronary artery disease, status post CABG x2 with LIMA to LAD, SVG to ramus 06/2014.  Recent cardiac catheterization 04/09/2018 revealed patent stents mid LAD and RCA, patent SVG to ramus, atretic LIMA to LAD, diffuse 95% still stenosis small caliber left circumflex, felt to be best treated medically.  Patient has a history of ischemic cardiomyopathy, with LV ejection fraction already to 45%.  Patient has paroxysmal atrial fibrillation, as well as, history of pulmonary embolus, on Xarelto, with recent history of hemoptysis.  Xarelto has been held, and patient transitioned to low-dose Eliquis 2.5 mg twice daily.  However, patient presented to [facility] ED with shortness of breath, chest x-ray revealed right greater than left patchy bilateral groundglass airspace disease suggesting multifocal pneumonia.  Patient seen by Dr. [name], who feels infectious process less likely in the absence of fever and leukocytosis, with clinical presentation more consistent with recent hemoptysis, pulmonary hemorrhage secondary to chronic anticoagulation.  Review of systems complete and found to be negative unless listed above     Past Medical History:  Diagnosis Date  . Arthritis    right hip, since a fall  . CAD (coronary artery disease)    a. 04/2012 Cath: 3VD->Med Rx;  b. 04/2013 PCI RCA (2 DES); c. 03/2014 PCI: LAD 80 (3.0x23 Xience Alpine DES); d. 06/2014 CABG x 2 ([facility]) LIMA->LAD, VG->OM; e. 12/2014 Cath ([facility]): patent grafts->Med Rx; f. 08/2015 Cath: patent grafts; g.  11/2016 Cath: LM min irregs, LAD 20p, 36m, LCX 100p/m, RCA 20ost, 10p/m/d, VG->OM1 nl, LIMA->dLAD nl; f. 07/2017 MV: No isch, EF 51%.  . Cardiomyopathy, ischemic    a. 04/2012 Echo: EF 40-45%;  b. 08/2013 Echo: EF 45-50%; c. 01/2015 Echo: EF 40-45%; d. 07/2016 Echo: EF 60-65%; e. 09/2017 Echo: EF 55-60%, Gr1 DD.  . Carotid arterial disease ([place])    a. 05/2013 Carotid U/S; bilat 40-50% ICA stenosis.  . Chronic Chest Pain    USES NITRO  . Chronic systolic CHF (congestive heart failure) ([place])    a. 01/2015 Echo: EF 40-45%; b. 07/2016 Echo: EF 60-65%; c. 09/2017 Echo: EF 55-60%, Gr1 DD, nl RV fxn.  . Cough   . Diverticulosis   . Dizziness   . Dyspnea    WITH EXERTION  . Headache 1970's   migraine  . History of blood transfusion 2016   post op  . Hyperlipidemia   . Hypertension   . Iron deficiency anemia due to chronic blood loss 09/11/2016  . Myocardial infarction [facility])    unsure of when  . Pulmonary emboli ([place]) 07/2016   a. On Xarelto.  . Reflux esophagitis   . Sternal pain    a. 03/2016 s/p Sternal wire removal; b. 05/2016 s/p redo median sternotomy for sternal debridement and sternal plating.  . Syncope 04/2018   CARDIOLOGIST WAS DR Rockey Situ AND HE WAS AWARE-NOW PT SEES Yusuke Beza  . Tubular adenoma of colon     Past Surgical History:  Procedure Laterality Date  . CARDIAC CATHETERIZATION  05/01/2012  . CARDIAC CATHETERIZATION  04/2013   [facility];x3 stent  . CARDIAC CATHETERIZATION  01/11/15    [facility]  . CARDIAC CATHETERIZATION N/A 09/16/2015  Procedure: LEFT HEART CATH AND CORS/GRAFTS ANGIOGRAPHY;  Surgeon: Minna Merritts, MD;  Location: [place] CV LAB;  Service: Cardiovascular;  Laterality: N/A;  . CARPAL TUNNEL RELEASE     right hand  . CATARACT EXTRACTION     LEFT  . CATARACT EXTRACTION W/PHACO Left 09/16/2014   Procedure: CATARACT EXTRACTION PHACO AND INTRAOCULAR LENS PLACEMENT (IOC);  Surgeon: Leandrew Koyanagi, MD;  Location: [place];  Service: Ophthalmology;   Laterality: Left;  . COLONOSCOPY    . COLONOSCOPY WITH PROPOFOL N/A 01/28/2016   Procedure: COLONOSCOPY WITH PROPOFOL;  Surgeon: Lollie Sails, MD;  Location: [facility] ENDOSCOPY;  Service: Endoscopy;  Laterality: N/A;  . CORONARY ANGIOPLASTY WITH STENT PLACEMENT  04/13/2014  . CORONARY ARTERY BYPASS GRAFT  06-26-14   x3 bypasses  . CORONARY PRESSURE WIRE/FFR WITH 3D MAPPING N/A 12/10/2018   Procedure: Suzette Battiest PRESSURE WIRE/FFR STUDY;  Surgeon: Isaias Cowman, MD;  Location: [place] [facility] LAB;  Service: Cardiovascular;  Laterality: N/A;  . CYSTOSCOPY N/A 08/14/2018   Procedure: CYSTOSCOPY;  Surgeon: Abbie Sons, MD;  Location: [facility] ORS;  Service: Urology;  Laterality: N/A;  . DE QUERVAIN'S RELEASE Left 08/22/2012  . ESOPHAGOGASTRODUODENOSCOPY (EGD) WITH PROPOFOL N/A 04/26/2015   Procedure: ESOPHAGOGASTRODUODENOSCOPY (EGD) WITH PROPOFOL;  Surgeon: Hulen Luster, MD;  Location: [facility] ENDOSCOPY;  Service: Gastroenterology;  Laterality: N/A;  . ESOPHAGOGASTRODUODENOSCOPY (EGD) WITH PROPOFOL N/A 05/29/2017   Procedure: ESOPHAGOGASTRODUODENOSCOPY (EGD) WITH PROPOFOL;  Surgeon: Lollie Sails, MD;  Location: [facility] ENDOSCOPY;  Service: Endoscopy;  Laterality: N/A;  . INTRAVASCULAR ULTRASOUND/IVUS N/A 12/10/2018   Procedure: Intravascular Ultrasound/IVUS;  Surgeon: Isaias Cowman, MD;  Location: [place] CV LAB;  Service: Cardiovascular;  Laterality: N/A;  . LEFT HEART CATH AND CORONARY ANGIOGRAPHY N/A 12/04/2016   Procedure: LEFT HEART CATH AND CORS/GRAFTS  ANGIOGRAPHY;  Surgeon: Wellington Hampshire, MD;  Location: [place] CV LAB;  Service: Cardiovascular;  Laterality: N/A;  . LEFT HEART CATH AND CORS/GRAFTS ANGIOGRAPHY N/A 04/09/2018   Procedure: LEFT HEART CATH AND CORS/GRAFTS ANGIOGRAPHY;  Surgeon: Isaias Cowman, MD;  Location: [place] [facility] LAB;  Service: Cardiovascular;  Laterality: N/A;  . LEFT HEART CATH AND CORS/GRAFTS ANGIOGRAPHY N/A 12/10/2018   Procedure: LEFT  HEART CATH AND CORS/GRAFTS ANGIOGRAPHY;  Surgeon: Isaias Cowman, MD;  Location: [place] [facility] LAB;  Service: Cardiovascular;  Laterality: N/A;  . RIB PLATING N/A 05/25/2016   Procedure: STERNAL PLATING;  Surgeon: Melrose Nakayama, MD;  Location: [place];  Service: Thoracic;  Laterality: N/A;  . right shoulder    . STERNAL WIRES REMOVAL N/A 03/30/2016   Procedure: STERNAL WIRES REMOVAL;  Surgeon: Melrose Nakayama, MD;  Location: [place];  Service: Thoracic;  Laterality: N/A;  . TONSILLECTOMY      Facility-Administered Medications Prior to Admission  Medication Dose Route Frequency Provider Last Rate Last Admin  . lidocaine (XYLOCAINE) 2 % jelly 1 application  1 application Urethral Once Stoioff, Ronda Fairly, MD       Medications Prior to Admission  Medication Sig Dispense Refill Last Dose  . atorvastatin (LIPITOR) 80 MG tablet Take 1 tablet (80 mg total) by mouth daily at 6 PM. 30 tablet 0 03/19/2019 at 1800  . ezetimibe (ZETIA) 10 MG tablet Take 1 tablet (10 mg total) by mouth daily. 90 tablet 3 03/20/2019 at 0800  . ipratropium (ATROVENT) 0.06 % nasal spray Place 2 sprays into both nostrils 3 (three) times daily as needed for rhinitis or allergies.   Unknown at PRN  .  isosorbide mononitrate (IMDUR) 60 MG 24 hr tablet Take 60 mg by mouth 2 (two) times daily.    03/20/2019 at 0800  . metoprolol succinate (TOPROL-XL) 25 MG 24 hr tablet Take 25 mg by mouth every evening.   03/19/2019 at 1900  . nitroGLYCERIN (NITROSTAT) 0.4 MG SL tablet Place 1 tablet (0.4 mg total) under the tongue every 5 (five) minutes as needed for chest pain. 25 tablet 6 Unknown at PRN  . ranolazine (RANEXA) 1000 MG SR tablet Take 1 tablet (1,000 mg total) by mouth 2 (two) times daily. 180 tablet 3 03/20/2019 at 0800  . sulfamethoxazole-trimethoprim (BACTRIM) 400-80 MG tablet Take 1 tablet by mouth 2 (two) times daily.   03/19/2019 at Unknown time  . ELIQUIS 2.5 MG TABS tablet Take 2.5 mg by mouth 2 (two) times daily.       Social History   Socioeconomic History  . Marital status: Married    Spouse name: Not on file  . Number of children: Not on file  . Years of education: Not on file  . Highest education level: Not on file  Occupational History  . Not on file  Tobacco Use  . Smoking status: Former Smoker    Packs/day: 1.00    Years: 35.00    Pack years: 35.00    Types: Cigarettes    Start date: 05/02/2009  . Smokeless tobacco: Former Network engineer and Sexual Activity  . Alcohol use: No  . Drug use: No  . Sexual activity: Yes  Other Topics Concern  . Not on file  Social History Narrative  . Not on file   Social Determinants of Health   Financial Resource Strain:   . Difficulty of Paying Living Expenses: Not on file  Food Insecurity:   . Worried About Charity fundraiser in the Last Year: Not on file  . Ran Out of Food in the Last Year: Not on file  Transportation Needs:   . Lack of Transportation (Medical): Not on file  . Lack of Transportation (Non-Medical): Not on file  Physical Activity:   . Days of Exercise per Week: Not on file  . Minutes of Exercise per Session: Not on file  Stress:   . Feeling of Stress : Not on file  Social Connections:   . Frequency of Communication with Friends and Family: Not on file  . Frequency of Social Gatherings with Friends and Family: Not on file  . Attends Religious Services: Not on file  . Active Member of Clubs or Organizations: Not on file  . Attends Archivist Meetings: Not on file  . Marital Status: Not on file  Intimate Partner Violence:   . Fear of Current or Ex-Partner: Not on file  . Emotionally Abused: Not on file  . Physically Abused: Not on file  . Sexually Abused: Not on file    Family History  Problem Relation Age of Onset  . Heart attack Father 57       MI  . Cancer Maternal Uncle       Review of systems complete and found to be negative unless listed above      PHYSICAL EXAM  General: Well developed,  well nourished, in no acute distress HEENT:  Normocephalic and atramatic Neck:  No JVD.  Lungs: Clear bilaterally to auscultation and percussion. Heart: HRRR . Normal S1 and S2 without gallops or murmurs.  Abdomen: Bowel sounds are positive, abdomen soft and non-tender  Msk:  Back normal, normal  gait. Normal strength and tone for age. Extremities: No clubbing, cyanosis or edema.   Neuro: Alert and oriented X 3. Psych:  Good affect, responds appropriately  Labs:   Lab Results  Component Value Date   WBC 11.3 (H) 03/22/2019   HGB 11.9 (L) 03/22/2019   HCT 35.5 (L) 03/22/2019   MCV 94.9 03/22/2019   PLT 304 03/22/2019    Recent Labs  Lab 03/20/19 1105 03/20/19 1146 03/22/19 0352  NA   < >  --  138  K   < >  --  4.3  CL   < >  --  107  CO2   < >  --  25  BUN   < >  --  28*  CREATININE   < >  --  0.95  CALCIUM   < >  --  8.3*  PROT  --  7.7  --   BILITOT  --  1.0  --   ALKPHOS  --  55  --   ALT  --  15  --   AST  --  22  --   GLUCOSE   < >  --  144*   < > = values in this interval not displayed.   Lab Results  Component Value Date   CKTOTAL 116 08/02/2017   CKMB 1.2 03/01/2014   TROPONINI <0.03 11/09/2017    Lab Results  Component Value Date   CHOL 128 10/03/2017   CHOL 103 11/07/2016   CHOL 111 06/12/2016   Lab Results  Component Value Date   HDL 46 10/03/2017   HDL 41 11/07/2016   HDL 41 06/12/2016   Lab Results  Component Value Date   LDLCALC 68 10/03/2017   LDLCALC 56 11/07/2016   LDLCALC 53 06/12/2016   Lab Results  Component Value Date   TRIG 70 10/03/2017   TRIG 31 11/07/2016   TRIG 84 06/12/2016   Lab Results  Component Value Date   CHOLHDL 2.8 10/03/2017   CHOLHDL 2.5 11/07/2016   CHOLHDL 2.7 06/12/2016   No results found for: LDLDIRECT    Radiology: MR ANGIO NECK W WO CONTRAST  Result Date: 02/24/2019 CLINICAL DATA:  Dizziness over the last 3-5 weeks. Balance disturbance. Occluded carotid artery. EXAM: MRA NECK WITHOUT AND WITH  CONTRAST TECHNIQUE: Multiplanar and multiecho pulse sequences of the neck were obtained without and with intravenous contrast. Angiographic images of the neck were obtained using MRA technique without and with intravenous contrast. CONTRAST:  81mL GADAVIST GADOBUTROL 1 MMOL/ML IV SOLN COMPARISON:  02/17/2019.  02/18/2019 FINDINGS: Branching pattern of the arch is normal. No origin stenoses. Both common carotid arteries are widely patent to their respective bifurcation. On the right, there is mild atherosclerotic irregularity of the ICA bulb but no stenosis. Cervical ICA widely patent beyond that. On the left, there is mild atherosclerotic irregularity in the ICA bulb but no stenosis. Cervical ICA widely patent beyond that. Both vertebral artery origins are patent. The left vertebral artery is dominant. Both vertebral arteries appear normal through the cervical region to the foramen magnum. The examination also includes the majority of the intracranial circulation. No anterior circulation stenosis or occlusion is seen. No basilar stenosis. Posterior circulation branch vessels show flow. Right PCA takes fetal origin the anterior circulation. IMPRESSION: Mild atherosclerotic irregularity of both ICA bulb regions but no carotid stenosis. No evidence of occlusion or significant focal vascular lesion. These results will be called to the ordering clinician or representative by the  Psychologist, clinical, and communication documented in the PACS or zVision Dashboard. Electronically Signed   By: Nelson Chimes M.D.   On: 02/24/2019 12:30   DG Chest Port 1 View  Result Date: 03/20/2019 CLINICAL DATA:  Chest pain and shortness of breath. EXAM: PORTABLE CHEST 1 VIEW COMPARISON:  01/05/2019 FINDINGS: 11:19 a.m. Low volume film. Patchy peripheral airspace opacity noted bilaterally, right greater than left. Cardiopericardial silhouette is at upper limits of normal for size. No substantial pleural effusion. Sternal fusion hardware  evident. Telemetry leads overlie the chest. IMPRESSION: Right greater than left patchy bilateral ground-glass airspace disease, peripherally predominant. Imaging features suggest multifocal pneumonia and atypical etiology would be a consideration. Electronically Signed   By: Misty Stanley M.D.   On: 03/20/2019 11:34    EKG: Sinus rhythm with left bundle branch block  ASSESSMENT AND PLAN:   1.  Respiratory failure, etiology unclear, chest x-ray revealed bilateral patchy airspace disease with recent history of hemoptysis secondary to Xarelto, now on Eliquis 2.  Coronary artery disease, status post multiple stents and CABG, currently appears clinically stable, high-sensitivity troponin 15, ECG nondiagnostic 3.  Paroxysmal atrial fibrillation, currently in sinus rhythm, was on Xarelto, transitioned to low-dose Eliquis 2.5 mg twice daily  Recommendations  1.  Agree with current therapy 2.  Hold Eliquis for now 3.  Continue isosorbide mononitrate, metoprolol succinate, and Ranexa 4.  Review 2D echocardiogram 5.  Further recommendations pending echocardiogram results 6.  May consider warfarin for stroke prevention with target INR 1.8-2.2 to minimize hemoptysis  Signed: Isaias Cowman MD,PhD, [facility] 03/22/2019, 10:23 AM

## 2019-03-22 NOTE — Progress Notes (Signed)
PHARMACIST - PHYSICIAN COMMUNICATION DR:   Loleta Books CONCERNING: Antibiotic IV to Oral Route Change Policy  RECOMMENDATION: This patient is receiving Azithromycin by the intravenous route.  Based on criteria approved by the Pharmacy and Therapeutics Committee, the antibiotic(s) is/are being converted to the equivalent oral dose form(s).  DESCRIPTION: These criteria include:  Patient being treated for a respiratory tract infection, urinary tract infection, cellulitis or clostridium difficile associated diarrhea if on metronidazole  The patient is not neutropenic and does not exhibit a GI malabsorption state  The patient is eating (either orally or via tube) and/or has been taking other orally administered medications for a least 24 hours  The patient is improving clinically and has a Tmax < 100.5  If you have questions about this conversion, please contact the [place], PharmD, BCPS Clinical Pharmacist 03/22/2019 1:52 PM

## 2019-03-23 LAB — CBC
HCT: 40 % (ref 39.0–52.0)
Hemoglobin: 13.3 g/dL (ref 13.0–17.0)
MCH: 31.4 pg (ref 26.0–34.0)
MCHC: 33.3 g/dL (ref 30.0–36.0)
MCV: 94.6 fL (ref 80.0–100.0)
Platelets: 320 10*3/uL (ref 150–400)
RBC: 4.23 MIL/uL (ref 4.22–5.81)
RDW: 14.4 % (ref 11.5–15.5)
WBC: 7.1 10*3/uL (ref 4.0–10.5)
nRBC: 0 % (ref 0.0–0.2)

## 2019-03-23 LAB — CULTURE, RESPIRATORY W GRAM STAIN: Culture: NORMAL

## 2019-03-23 LAB — BASIC METABOLIC PANEL
Anion gap: 13 (ref 5–15)
BUN: 29 mg/dL — ABNORMAL HIGH (ref 8–23)
CO2: 27 mmol/L (ref 22–32)
Calcium: 8.8 mg/dL — ABNORMAL LOW (ref 8.9–10.3)
Chloride: 99 mmol/L (ref 98–111)
Creatinine, Ser: 1.06 mg/dL (ref 0.61–1.24)
GFR calc Af Amer: >60 mL/min (ref >60)
GFR calc non Af Amer: >60 mL/min (ref >60)
Glucose, Bld: 150 mg/dL — ABNORMAL HIGH (ref 70–99)
Potassium: 4.2 mmol/L (ref 3.5–5.1)
Sodium: 139 mmol/L (ref 135–145)

## 2019-03-23 LAB — SAR COV2 SEROLOGY (COVID19)AB(IGG),IA: SARS-CoV-2 Ab, IgG: REACTIVE — AB

## 2019-03-23 MED ORDER — PREDNISONE 20 MG PO TABS
20.0000 mg | ORAL_TABLET | Freq: Every day | ORAL | Status: DC
  Administered 2019-03-23 – 2019-03-24 (×2): 20 mg via ORAL
  Filled 2019-03-23 (×2): qty 1

## 2019-03-23 MED ORDER — COLCHICINE 0.6 MG PO TABS
0.6000 mg | ORAL_TABLET | Freq: Every day | ORAL | Status: DC
  Administered 2019-03-23 – 2019-03-25 (×3): 0.6 mg via ORAL
  Filled 2019-03-23 (×3): qty 1

## 2019-03-23 MED ORDER — SULFAMETHOXAZOLE-TRIMETHOPRIM 400-80 MG PO TABS
1.0000 | ORAL_TABLET | Freq: Two times a day (BID) | ORAL | Status: DC
  Administered 2019-03-23 – 2019-03-25 (×4): 1 via ORAL
  Filled 2019-03-23 (×5): qty 1

## 2019-03-23 NOTE — Progress Notes (Signed)
[facility] Cardiology  SUBJECTIVE: Patient laying in bed, denies chest pain, reports episode of hemoptysis earlier in a.m., with episode of shortness of breath and hypoxia last night   Vitals:   03/22/19 2039 03/23/19 0520 03/23/19 0742 03/23/19 0820  BP: 99/66 120/82  106/89  Pulse: 75 81  100  Resp: 20 19  (!) 26  Temp: 97.7 F (36.5 C) 97.8 F (36.6 C)  97.9 F (36.6 C)  TempSrc: Oral Oral  Oral  SpO2: 98% 90% 91% 95%  Weight:      Height:         Intake/Output Summary (Last 24 hours) at 03/23/2019 0911 Last data filed at 03/23/2019 0536 Gross per 24 hour  Intake 343.22 ml  Output 1550 ml  Net -1206.78 ml      PHYSICAL EXAM  General: Well developed, well nourished, in no acute distress HEENT:  Normocephalic and atramatic Neck:  No JVD.  Lungs: Clear bilaterally to auscultation and percussion. Heart: HRRR . Normal S1 and S2 without gallops or murmurs.  Abdomen: Bowel sounds are positive, abdomen soft and non-tender  Msk:  Back normal, normal gait. Normal strength and tone for age. Extremities: No clubbing, cyanosis or edema.   Neuro: Alert and oriented X 3. Psych:  Good affect, responds appropriately   LABS: Basic Metabolic Panel: Recent Labs    03/22/19 0352 03/23/19 0829  NA 138 139  K 4.3 4.2  CL 107 99  CO2 25 27  GLUCOSE 144* 150*  BUN 28* 29*  CREATININE 0.95 1.06  CALCIUM 8.3* 8.8*   Liver Function Tests: Recent Labs    03/20/19 1146  AST 22  ALT 15  ALKPHOS 55  BILITOT 1.0  PROT 7.7  ALBUMIN 3.1*   No results for input(s): LIPASE, AMYLASE in the last 72 hours. CBC: Recent Labs    03/22/19 0352 03/23/19 0829  WBC 11.3* 7.1  HGB 11.9* 13.3  HCT 35.5* 40.0  MCV 94.9 94.6  PLT 304 320   Cardiac Enzymes: No results for input(s): CKTOTAL, CKMB, CKMBINDEX, TROPONINI in the last 72 hours. BNP: Invalid input(s): POCBNP D-Dimer: No results for input(s): DDIMER in the last 72 hours. Hemoglobin A1C: No results for input(s): HGBA1C in the  last 72 hours. Fasting Lipid Panel: No results for input(s): CHOL, HDL, LDLCALC, TRIG, CHOLHDL, LDLDIRECT in the last 72 hours. Thyroid Function Tests: No results for input(s): TSH, T4TOTAL, T3FREE, THYROIDAB in the last 72 hours.  Invalid input(s): FREET3 Anemia Panel: No results for input(s): VITAMINB12, FOLATE, FERRITIN, TIBC, IRON, RETICCTPCT in the last 72 hours.  ECHOCARDIOGRAM COMPLETE  Result Date: 03/22/2019    ECHOCARDIOGRAM REPORT   Patient Name:   Scott [name] Date of Exam: 03/21/2019 Medical Rec #:  [id number]      Height:       70.0 in Accession #:    [id number]     Weight:       168.0 lb Date of Birth:  March 30, 1940       BSA:          1.938 m Patient Age:    79 years       BP:           113/59 mmHg Patient Gender: M              HR:           81 bpm. Exam Location:  [facility] Procedure: 2D Echo Indications:     Mitral Valve Insufficiency 424.0/134.0  History:  Patient has prior history of Echocardiogram examinations, most                  recent 10/02/2017. Prior CABG, Signs/Symptoms:Shortness of                  Breath and Chest Pain; Risk Factors:Dyslipidemia.  Sonographer:     Avanell Shackleton Referring Phys:  [id number] Ottie Glazier Diagnosing Phys: Isaias Cowman MD  Sonographer Comments: Technically difficult study due to poor echo windows. IMPRESSIONS  1. Left ventricular ejection fraction, by estimation, is 40 to 45%. The left ventricle has mildly decreased function. The left ventricle has no regional wall motion abnormalities. Left ventricular diastolic parameters are consistent with Grade I diastolic dysfunction (impaired relaxation).  2. Right ventricular systolic function is normal. The right ventricular size is normal.  3. The mitral valve is normal in structure and function. Mild mitral valve regurgitation. No evidence of mitral stenosis.  4. The aortic valve is normal in structure and function. Aortic valve regurgitation is not visualized. No aortic stenosis is present.  5.  The inferior vena cava is normal in size with greater than 50% respiratory variability, suggesting right atrial pressure of 3 mmHg. FINDINGS  Left Ventricle: Left ventricular ejection fraction, by estimation, is 40 to 45%. The left ventricle has mildly decreased function. The left ventricle has no regional wall motion abnormalities. The left ventricular internal cavity size was normal in size. There is no left ventricular hypertrophy. Left ventricular diastolic parameters are consistent with Grade I diastolic dysfunction (impaired relaxation). Right Ventricle: The right ventricular size is normal. No increase in right ventricular wall thickness. Right ventricular systolic function is normal. Left Atrium: Left atrial size was normal in size. Right Atrium: Right atrial size was normal in size. Pericardium: There is no evidence of pericardial effusion. Mitral Valve: The mitral valve is normal in structure and function. Normal mobility of the mitral valve leaflets. Mild mitral valve regurgitation. No evidence of mitral valve stenosis. Tricuspid Valve: The tricuspid valve is normal in structure. Tricuspid valve regurgitation is mild . No evidence of tricuspid stenosis. Aortic Valve: The aortic valve is normal in structure and function. Aortic valve regurgitation is not visualized. No aortic stenosis is present. Pulmonic Valve: The pulmonic valve was normal in structure. Pulmonic valve regurgitation is not visualized. No evidence of pulmonic stenosis. Aorta: The aortic root is normal in size and structure. Venous: The inferior vena cava is normal in size with greater than 50% respiratory variability, suggesting right atrial pressure of 3 mmHg. IAS/Shunts: No atrial level shunt detected by color flow Doppler.  LEFT VENTRICLE PLAX 2D LVIDd:         5.33 cm      Diastology LVIDs:         4.54 cm      LV e' lateral:   11.10 cm/s LV PW:         0.90 cm      LV E/e' lateral: 4.8 LV IVS:        1.01 cm      LV e' medial:    6.96  cm/s LVOT diam:     1.80 cm      LV E/e' medial:  7.7 LVOT Area:     2.54 cm  LV Volumes (MOD) LV vol d, MOD A2C: 148.0 ml LV vol d, MOD A4C: 141.0 ml LV vol s, MOD A2C: 88.0 ml LV vol s, MOD A4C: 85.7 ml LV SV MOD A2C:  60.0 ml LV SV MOD A4C:     141.0 ml LV SV MOD BP:      59.4 ml RIGHT VENTRICLE            IVC RV S prime:     8.27 cm/s  IVC diam: 1.98 cm LEFT ATRIUM             Index LA diam:        4.70 cm 2.42 cm/m LA Vol (A2C):   42.7 ml 22.03 ml/m LA Vol (A4C):   46.0 ml 23.73 ml/m LA Biplane Vol: 45.4 ml 23.42 ml/m   AORTA Ao Root diam: 3.30 cm MITRAL VALVE MV Area (PHT): 3.21 cm    SHUNTS MV Decel Time: 236 msec    Systemic Diam: 1.80 cm MV E velocity: 53.80 cm/s MV A velocity: 84.90 cm/s MV E/A ratio:  0.63 Isaias Cowman MD Electronically signed by Isaias Cowman MD Signature Date/Time: 03/22/2019/12:37:17 PM    Final      Echo LVEF 40 to 45%, mild mitral regurgitation  TELEMETRY: Sinus rhythm with left bundle branch block:  ASSESSMENT AND PLAN:  Principal Problem:   Multifocal pneumonia Active Problems:   Essential hypertension   PAF (paroxysmal atrial fibrillation) (HCC)   Chronic diastolic heart failure (HCC)   Hyperlipemia   Pulmonary embolus (HCC)   CAD (coronary artery disease)    1.  Respiratory failure, etiology unclear, chest x-ray revealed bilateral patchy airspace disease, with recent history of hemoptysis on Xarelto and Eliquis, now being held, with episodic shortness of breath and hypoxia, being empirically treated for community-acquired pneumonia 2.  Coronary artery disease, status post multiple stents and CABG, medically stable, with normal high-sensitivity troponin, ECG nondiagnostic 3.  Ischemic cardiomyopathy, LVEF 40 to 45%, no recent change by 2D echocardiogram, does not appear to be fluid overloaded 4.  Paroxysmal atrial fibrillation, currently in sinus rhythm, previously on Xarelto, transition to low-dose Eliquis, being held due to  hemoptysis 5.  COPD, followed by Dr. [name]  Recommendations  1.  Agree with current therapy 2.  Continue to hold Eliquis 3.  Continue isosorbide mononitrate, metoprolol succinate, and Ranexa 4.  Consider warfarin for stroke prevention, with target INR 1.8-2.2 to minimize hemoptysis, may be initiated as outpatient 5.  Follow-up as outpatient in 1 week     Isaias Cowman, MD, PhD, [facility] 03/23/2019 9:11 AM

## 2019-03-23 NOTE — Progress Notes (Signed)
Pulmonary Medicine          Date: 03/23/2019,   MRN# [id number] Scott [name] 04-23-1940  AdmissionWeight: 76.2 kg                 CurrentWeight: 76.2 kg  Referring physician: Dr. Loleta Books    CHIEF COMPLAINT:   Acute on chronic hypoxemic respiratory failure   SUBJECTIVE   Patient sitting up in bed with wife at bedside.    03/23/19- He is still hypoxemic with spO2 dropping to 83% on 4L [place] when using commode to urinate.     -Discussed plan for bronchoscopy after eliquis is fully out of circulation - 5 more days and will need to schedule with anesthesia for general.       PAST MEDICAL HISTORY   Past Medical History:  Diagnosis Date  . Arthritis    right hip, since a fall  . CAD (coronary artery disease)    a. 04/2012 Cath: 3VD->Med Rx;  b. 04/2013 PCI RCA (2 DES); c. 03/2014 PCI: LAD 80 (3.0x23 Xience Alpine DES); d. 06/2014 CABG x 2 ([facility]) LIMA->LAD, VG->OM; e. 12/2014 Cath ([facility]): patent grafts->Med Rx; f. 08/2015 Cath: patent grafts; g. 11/2016 Cath: LM min irregs, LAD 20p, 3m, LCX 100p/m, RCA 20ost, 10p/m/d, VG->OM1 nl, LIMA->dLAD nl; f. 07/2017 MV: No isch, EF 51%.  . Cardiomyopathy, ischemic    a. 04/2012 Echo: EF 40-45%;  b. 08/2013 Echo: EF 45-50%; c. 01/2015 Echo: EF 40-45%; d. 07/2016 Echo: EF 60-65%; e. 09/2017 Echo: EF 55-60%, Gr1 DD.  . Carotid arterial disease ([place])    a. 05/2013 Carotid U/S; bilat 40-50% ICA stenosis.  . Chronic Chest Pain    USES NITRO  . Chronic systolic CHF (congestive heart failure) ([place])    a. 01/2015 Echo: EF 40-45%; b. 07/2016 Echo: EF 60-65%; c. 09/2017 Echo: EF 55-60%, Gr1 DD, nl RV fxn.  . Cough   . Diverticulosis   . Dizziness   . Dyspnea    WITH EXERTION  . Headache 1970's   migraine  . History of blood transfusion 2016   post op  . Hyperlipidemia   . Hypertension   . Iron deficiency anemia due to chronic blood loss 09/11/2016  . Myocardial infarction [facility])    unsure of when  . Pulmonary emboli ([place]) 07/2016   a. On Xarelto.   . Reflux esophagitis   . Sternal pain    a. 03/2016 s/p Sternal wire removal; b. 05/2016 s/p redo median sternotomy for sternal debridement and sternal plating.  . Syncope 04/2018   CARDIOLOGIST WAS DR Rockey Situ AND HE WAS AWARE-NOW PT SEES PARASCHOS  . Tubular adenoma of colon      SURGICAL HISTORY   Past Surgical History:  Procedure Laterality Date  . CARDIAC CATHETERIZATION  05/01/2012  . CARDIAC CATHETERIZATION  04/2013   [facility];x3 stent  . CARDIAC CATHETERIZATION  01/11/15    [facility]  . CARDIAC CATHETERIZATION N/A 09/16/2015   Procedure: LEFT HEART CATH AND CORS/GRAFTS ANGIOGRAPHY;  Surgeon: Minna Merritts, MD;  Location: [place] [facility];  Service: Cardiovascular;  Laterality: N/A;  . CARPAL TUNNEL RELEASE     right hand  . CATARACT EXTRACTION     LEFT  . CATARACT EXTRACTION W/PHACO Left 09/16/2014   Procedure: CATARACT EXTRACTION PHACO AND INTRAOCULAR LENS PLACEMENT (IOC);  Surgeon: Leandrew Koyanagi, MD;  Location: [place];  Service: Ophthalmology;  Laterality: Left;  . COLONOSCOPY    . COLONOSCOPY WITH PROPOFOL N/A 01/28/2016  Procedure: COLONOSCOPY WITH PROPOFOL;  Surgeon: Lollie Sails, MD;  Location: [facility] ENDOSCOPY;  Service: Endoscopy;  Laterality: N/A;  . CORONARY ANGIOPLASTY WITH STENT PLACEMENT  04/13/2014  . CORONARY ARTERY BYPASS GRAFT  06-26-14   x3 bypasses  . CORONARY PRESSURE WIRE/FFR WITH 3D MAPPING N/A 12/10/2018   Procedure: Suzette Battiest PRESSURE WIRE/FFR STUDY;  Surgeon: Isaias Cowman, MD;  Location: [place] [facility];  Service: Cardiovascular;  Laterality: N/A;  . CYSTOSCOPY N/A 08/14/2018   Procedure: CYSTOSCOPY;  Surgeon: Abbie Sons, MD;  Location: [facility] ORS;  Service: Urology;  Laterality: N/A;  . DE QUERVAIN'S RELEASE Left 08/22/2012  . ESOPHAGOGASTRODUODENOSCOPY (EGD) WITH PROPOFOL N/A 04/26/2015   Procedure: ESOPHAGOGASTRODUODENOSCOPY (EGD) WITH PROPOFOL;  Surgeon: Hulen Luster, MD;  Location: [facility] ENDOSCOPY;  Service:  Gastroenterology;  Laterality: N/A;  . ESOPHAGOGASTRODUODENOSCOPY (EGD) WITH PROPOFOL N/A 05/29/2017   Procedure: ESOPHAGOGASTRODUODENOSCOPY (EGD) WITH PROPOFOL;  Surgeon: Lollie Sails, MD;  Location: [facility] ENDOSCOPY;  Service: Endoscopy;  Laterality: N/A;  . INTRAVASCULAR ULTRASOUND/IVUS N/A 12/10/2018   Procedure: Intravascular Ultrasound/IVUS;  Surgeon: Isaias Cowman, MD;  Location: [place] [facility];  Service: Cardiovascular;  Laterality: N/A;  . LEFT HEART CATH AND CORONARY ANGIOGRAPHY N/A 12/04/2016   Procedure: LEFT HEART CATH AND CORS/GRAFTS  ANGIOGRAPHY;  Surgeon: Wellington Hampshire, MD;  Location: [place] [facility];  Service: Cardiovascular;  Laterality: N/A;  . LEFT HEART CATH AND CORS/GRAFTS ANGIOGRAPHY N/A 04/09/2018   Procedure: LEFT HEART CATH AND CORS/GRAFTS ANGIOGRAPHY;  Surgeon: Isaias Cowman, MD;  Location: [place] [facility];  Service: Cardiovascular;  Laterality: N/A;  . LEFT HEART CATH AND CORS/GRAFTS ANGIOGRAPHY N/A 12/10/2018   Procedure: LEFT HEART CATH AND CORS/GRAFTS ANGIOGRAPHY;  Surgeon: Isaias Cowman, MD;  Location: [place] [facility];  Service: Cardiovascular;  Laterality: N/A;  . RIB PLATING N/A 05/25/2016   Procedure: STERNAL PLATING;  Surgeon: Melrose Nakayama, MD;  Location: [place];  Service: Thoracic;  Laterality: N/A;  . right shoulder    . STERNAL WIRES REMOVAL N/A 03/30/2016   Procedure: STERNAL WIRES REMOVAL;  Surgeon: Melrose Nakayama, MD;  Location: [place];  Service: Thoracic;  Laterality: N/A;  . TONSILLECTOMY       FAMILY HISTORY   Family History  Problem Relation Age of Onset  . Heart attack Father 1       MI  . Cancer Maternal Uncle      SOCIAL HISTORY   Social History   Tobacco Use  . Smoking status: Former Smoker    Packs/day: 1.00    Years: 35.00    Pack years: 35.00    Types: Cigarettes    Start date: 05/02/2009  . Smokeless tobacco: Former Network engineer Use Topics  . Alcohol use: No  .  Drug use: No     MEDICATIONS    Home Medication:    Current Medication:  Current Facility-Administered Medications:  .  0.9 %  sodium chloride infusion, , Intravenous, PRN, Ivor Costa, MD, Stopped at 03/23/19 1056 .  acetaminophen (TYLENOL) tablet 650 mg, 650 mg, Oral, Q6H PRN, Ivor Costa, MD, 650 mg at 03/22/19 1052 .  albuterol (PROVENTIL) (2.5 MG/3ML) 0.083% nebulizer solution 2.5 mg, 2.5 mg, Nebulization, Q4H PRN, Danford, Suann Larry, MD, 2.5 mg at 03/23/19 0553 .  ascorbic acid (VITAMIN C) tablet 500 mg, 500 mg, Oral, Daily, Ivor Costa, MD, 500 mg at 03/23/19 1034 .  atorvastatin (LIPITOR) tablet 80 mg, 80 mg, Oral, q1800, Ivor Costa, MD, 80 mg at 03/22/19  1837 .  azithromycin (ZITHROMAX) tablet 500 mg, 500 mg, Oral, Daily, Lu Duffel, RPH, 500 mg at 03/23/19 1034 .  cefTRIAXone (ROCEPHIN) 2 g in sodium chloride 0.9 % 100 mL IVPB, 2 g, Intravenous, Daily, Ivor Costa, MD, Last Rate: 200 mL/hr at 03/23/19 1108, Rate Verify at 03/23/19 1108 .  dextromethorphan-guaiFENesin (MUCINEX DM) 30-600 MG per 12 hr tablet 1 tablet, 1 tablet, Oral, BID, Ivor Costa, MD, 1 tablet at 03/23/19 1033 .  ezetimibe (ZETIA) tablet 10 mg, 10 mg, Oral, Daily, Ivor Costa, MD, 10 mg at 03/23/19 1034 .  feeding supplement (ENSURE ENLIVE) (ENSURE ENLIVE) liquid 237 mL, 237 mL, Oral, BID BM, Danford, Suann Larry, MD, 237 mL at 03/23/19 1052 .  ipratropium-albuterol (DUONEB) 0.5-2.5 (3) MG/3ML nebulizer solution 3 mL, 3 mL, Nebulization, TID, Danford, Suann Larry, MD, 3 mL at 03/23/19 0742 .  methylPREDNISolone sodium succinate (SOLU-MEDROL) 40 mg/mL injection 40 mg, 40 mg, Intravenous, Q12H, Danford, Suann Larry, MD, 40 mg at 03/23/19 0600 .  mometasone-formoterol (DULERA) 100-5 MCG/ACT inhaler 2 puff, 2 puff, Inhalation, BID, Ottie Glazier, MD, 2 puff at 03/23/19 1047 .  nitroGLYCERIN (NITROSTAT) SL tablet 0.4 mg, 0.4 mg, Sublingual, Q5 min PRN, Ivor Costa, MD .  ondansetron [facility]) injection  4 mg, 4 mg, Intravenous, Q8H PRN, Ivor Costa, MD .  sodium chloride flush (NS) 0.9 % injection 3 mL, 3 mL, Intravenous, Once, Ivor Costa, MD .  zinc sulfate capsule 220 mg, 220 mg, Oral, Daily, Ivor Costa, MD, 220 mg at 03/23/19 1034    ALLERGIES   Contrast media [iodinated diagnostic agents], Metrizamide, Oxycodone hcl, and Vicodin [hydrocodone-acetaminophen]     REVIEW OF SYSTEMS    Review of Systems:  Gen:  Denies  fever, sweats, chills weigh loss  HEENT: Denies blurred vision, double vision, ear pain, eye pain, hearing loss, nose bleeds, sore throat Cardiac:  No dizziness, chest pain or heaviness, chest tightness,edema Resp:   Denies cough or sputum porduction, shortness of breath,wheezing, hemoptysis,  Gi: Denies swallowing difficulty, stomach pain, nausea or vomiting, diarrhea, constipation, bowel incontinence Gu:  Denies bladder incontinence, burning urine Ext:   Denies Joint pain, stiffness or swelling Skin: Denies  skin rash, easy bruising or bleeding or hives Endoc:  Denies polyuria, polydipsia , polyphagia or weight change Psych:   Denies depression, insomnia or hallucinations   Other:  All other systems negative   VS: BP 111/63 (BP Location: Right Arm)   Pulse 84   Temp 97.9 F (36.6 C) (Oral)   Resp (!) 26   Ht 5\' 10"  (1.778 m)   Wt 76.2 kg   SpO2 97%   BMI 24.11 kg/m      PHYSICAL EXAM    GENERAL:NAD, no fevers, chills, no weakness no fatigue HEAD: Normocephalic, atraumatic.  EYES: Pupils equal, round, reactive to light. Extraocular muscles intact. No scleral icterus.  MOUTH: Moist mucosal membrane. Dentition intact. No abscess noted.  EAR, NOSE, THROAT: Clear without exudates. No external lesions.  NECK: Supple. No thyromegaly. No nodules. No JVD.  PULMONARY: Decreased breath sounds bilaterally without adventitious lung sounds CARDIOVASCULAR: S1 and S2. Regular rate and rhythm. No murmurs, rubs, or gallops. No edema. Pedal pulses 2+ bilaterally.   GASTROINTESTINAL: Soft, nontender, nondistended. No masses. Positive bowel sounds. No hepatosplenomegaly.  MUSCULOSKELETAL: No swelling, clubbing, or edema. Range of motion full in all extremities.  NEUROLOGIC: Cranial nerves II through XII are intact. No gross focal neurological deficits. Sensation intact. Reflexes intact.  SKIN: No ulceration, lesions, rashes, or  cyanosis. Skin warm and dry. Turgor intact.  PSYCHIATRIC: Mood, affect within normal limits. The patient is awake, alert and oriented x 3. Insight, judgment intact.       IMAGING    MR ANGIO NECK W WO CONTRAST  Result Date: 02/24/2019 CLINICAL DATA:  Dizziness over the last 3-5 weeks. Balance disturbance. Occluded carotid artery. EXAM: MRA NECK WITHOUT AND WITH CONTRAST TECHNIQUE: Multiplanar and multiecho pulse sequences of the neck were obtained without and with intravenous contrast. Angiographic images of the neck were obtained using MRA technique without and with intravenous contrast. CONTRAST:  78mL GADAVIST GADOBUTROL 1 MMOL/ML IV SOLN COMPARISON:  02/17/2019.  02/18/2019 FINDINGS: Branching pattern of the arch is normal. No origin stenoses. Both common carotid arteries are widely patent to their respective bifurcation. On the right, there is mild atherosclerotic irregularity of the ICA bulb but no stenosis. Cervical ICA widely patent beyond that. On the left, there is mild atherosclerotic irregularity in the ICA bulb but no stenosis. Cervical ICA widely patent beyond that. Both vertebral artery origins are patent. The left vertebral artery is dominant. Both vertebral arteries appear normal through the cervical region to the foramen magnum. The examination also includes the majority of the intracranial circulation. No anterior circulation stenosis or occlusion is seen. No basilar stenosis. Posterior circulation branch vessels show flow. Right PCA takes fetal origin the anterior circulation. IMPRESSION: Mild atherosclerotic irregularity  of both ICA bulb regions but no carotid stenosis. No evidence of occlusion or significant focal vascular lesion. These results will be called to the ordering clinician or representative by the Radiologist Assistant, and communication documented in the PACS or zVision Dashboard. Electronically Signed   By: Nelson Chimes M.D.   On: 02/24/2019 12:30   DG Chest Port 1 View  Result Date: 03/20/2019 CLINICAL DATA:  Chest pain and shortness of breath. EXAM: PORTABLE CHEST 1 VIEW COMPARISON:  01/05/2019 FINDINGS: 11:19 a.m. Low volume film. Patchy peripheral airspace opacity noted bilaterally, right greater than left. Cardiopericardial silhouette is at upper limits of normal for size. No substantial pleural effusion. Sternal fusion hardware evident. Telemetry leads overlie the chest. IMPRESSION: Right greater than left patchy bilateral ground-glass airspace disease, peripherally predominant. Imaging features suggest multifocal pneumonia and atypical etiology would be a consideration. Electronically Signed   By: Misty Stanley M.D.   On: 03/20/2019 11:34   ECHOCARDIOGRAM COMPLETE  Result Date: 03/22/2019    ECHOCARDIOGRAM REPORT   Patient Name:   AMICHAI ELKS Date of Exam: 03/21/2019 Medical Rec #:  [id number]      Height:       70.0 in Accession #:    [id number]     Weight:       168.0 lb Date of Birth:  August 09, 1940       BSA:          1.938 m Patient Age:    56 years       BP:           113/59 mmHg Patient Gender: M              HR:           81 bpm. Exam Location:  [facility] Procedure: 2D Echo Indications:     Mitral Valve Insufficiency 424.0/134.0  History:         Patient has prior history of Echocardiogram examinations, most                  recent 10/02/2017. Prior CABG, Signs/Symptoms:Shortness  of                  Breath and Chest Pain; Risk Factors:Dyslipidemia.  Sonographer:     Avanell Shackleton Referring Phys:  [id number] Ottie Glazier Diagnosing Phys: Isaias Cowman MD  Sonographer Comments: Technically difficult  study due to poor echo windows. IMPRESSIONS  1. Left ventricular ejection fraction, by estimation, is 40 to 45%. The left ventricle has mildly decreased function. The left ventricle has no regional wall motion abnormalities. Left ventricular diastolic parameters are consistent with Grade I diastolic dysfunction (impaired relaxation).  2. Right ventricular systolic function is normal. The right ventricular size is normal.  3. The mitral valve is normal in structure and function. Mild mitral valve regurgitation. No evidence of mitral stenosis.  4. The aortic valve is normal in structure and function. Aortic valve regurgitation is not visualized. No aortic stenosis is present.  5. The inferior vena cava is normal in size with greater than 50% respiratory variability, suggesting right atrial pressure of 3 mmHg. FINDINGS  Left Ventricle: Left ventricular ejection fraction, by estimation, is 40 to 45%. The left ventricle has mildly decreased function. The left ventricle has no regional wall motion abnormalities. The left ventricular internal cavity size was normal in size. There is no left ventricular hypertrophy. Left ventricular diastolic parameters are consistent with Grade I diastolic dysfunction (impaired relaxation). Right Ventricle: The right ventricular size is normal. No increase in right ventricular wall thickness. Right ventricular systolic function is normal. Left Atrium: Left atrial size was normal in size. Right Atrium: Right atrial size was normal in size. Pericardium: There is no evidence of pericardial effusion. Mitral Valve: The mitral valve is normal in structure and function. Normal mobility of the mitral valve leaflets. Mild mitral valve regurgitation. No evidence of mitral valve stenosis. Tricuspid Valve: The tricuspid valve is normal in structure. Tricuspid valve regurgitation is mild . No evidence of tricuspid stenosis. Aortic Valve: The aortic valve is normal in structure and function. Aortic  valve regurgitation is not visualized. No aortic stenosis is present. Pulmonic Valve: The pulmonic valve was normal in structure. Pulmonic valve regurgitation is not visualized. No evidence of pulmonic stenosis. Aorta: The aortic root is normal in size and structure. Venous: The inferior vena cava is normal in size with greater than 50% respiratory variability, suggesting right atrial pressure of 3 mmHg. IAS/Shunts: No atrial level shunt detected by color flow Doppler.  LEFT VENTRICLE PLAX 2D LVIDd:         5.33 cm      Diastology LVIDs:         4.54 cm      LV e' lateral:   11.10 cm/s LV PW:         0.90 cm      LV E/e' lateral: 4.8 LV IVS:        1.01 cm      LV e' medial:    6.96 cm/s LVOT diam:     1.80 cm      LV E/e' medial:  7.7 LVOT Area:     2.54 cm  LV Volumes (MOD) LV vol d, MOD A2C: 148.0 ml LV vol d, MOD A4C: 141.0 ml LV vol s, MOD A2C: 88.0 ml LV vol s, MOD A4C: 85.7 ml LV SV MOD A2C:     60.0 ml LV SV MOD A4C:     141.0 ml LV SV MOD BP:      59.4 ml RIGHT VENTRICLE  IVC RV S prime:     8.27 cm/s  IVC diam: 1.98 cm LEFT ATRIUM             Index LA diam:        4.70 cm 2.42 cm/m LA Vol (A2C):   42.7 ml 22.03 ml/m LA Vol (A4C):   46.0 ml 23.73 ml/m LA Biplane Vol: 45.4 ml 23.42 ml/m   AORTA Ao Root diam: 3.30 cm MITRAL VALVE MV Area (PHT): 3.21 cm    SHUNTS MV Decel Time: 236 msec    Systemic Diam: 1.80 cm MV E velocity: 53.80 cm/s MV A velocity: 84.90 cm/s MV E/A ratio:  0.63 Isaias Cowman MD Electronically signed by Isaias Cowman MD Signature Date/Time: 03/22/2019/12:37:17 PM    Final              ASSESSMENT/PLAN   Acute on chronic hypoxemic respiratory failure -Clinical presentation less likely infectious due to absence of fevers, leukocytosis, significant abnormalities in blood pressure or vital signs sites hypoxemia -Septic work-up has been done and is in process -Agree with empiric community-acquired pneumonia therapy currently on Zithromax  Rocephin -possibly related to mitral valve regurgitation and anticoagulation with resultant pulmonary hemorrhage - will hold Eliquis at this time and have discussed case with Dr Saralyn Pilar - repeat transthoracic echo - unchanged from previous with mild MR -Complicated cardiac history including CABG, multivessel CAD, PAF, mitral valve regurgitation,  CHF with reduced EF -Patient may need bronchoscopy when in chronic stable state on outpatient basis    Non-massive hemoptysis -Patient's had recurrent low volume hemoptysis in the past and recently last month which has self resolved    History of pulmonary embolism -Patient had left upper lobe segmental PE in 2018 which tracked all the way up to left main PA, repeat CT chest showed resolution of clot burden. 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   Patient/Family are satisfied with care plan and all questions have been answered.  This document was prepared using Dragon voice recognition software and may include unintentional dictation errors.     Ottie Glazier, M.D.  Division of [place]

## 2019-03-23 NOTE — Progress Notes (Addendum)
PROGRESS NOTE    Scott [name]  [id number] DOB: 04/20/1940 DOA: 03/20/2019 PCP: Baxter Hire, MD      Brief Narrative:  [name] is a 79 y.o. M with CAD last PCI >51yr, ischemic CM, sCHF EF 55% with chronic respiratory failure on 3L at baseline, PVD, HTN, and hx PE on Eliquis who presented with fever, cough, SOB for 1 week.  IN the ER, COVID PCR negative, CXR showed multifocal pneumonia.  Sats stable on home O2 (but had desaturated to 70s at home).       Assessment & Plan:   Acute on chronic hypoxic respiratory failure  Initially thought to be CAP or COVID, started on empiric antibiotics, remdesivir and steroids.  COVID PCR subsequently negative.  Remdesivir stopped.  Strep pneumo and legionella antigens negative, sputum culture pending.  Procalcitonins very low, and without leukocytosis or fever, Pulmonology were consulted to broaden differential.    At baseline, patient is on 3L O2, but interestingly, only in the last month.    Pulmonary hemorrhage, COVID fibrosis, atypical/fungal pneumonia are all being considered.  Cardiogenic hypoxia has been ruled out (normal echo, no response to Lasix, no signs of HF on exam).  -Hold all anticoagulation, Cardiology prefer to revisit this after an interval, in the office  -Continue empiric ceftriaxone and azithromycin for now, day 4 of 5 -Continue Solu-medrol -Continue supplemental O2   -Continue pulmonary toilet -Follow sputum culture  -Consult Pulmonology    ADDENDUM: Reviewed with Dr. [name], positive COVID IgG unlikely to be from vaccination, as his first shot was just days ago.  It appears that he has had COVID despite multiple previous negative COVID PCRs.  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      Atrial fibrillation, paroxysmal HR controlled -Hold metoprolol due to hypotension -Hold ELiquis   Hypertension Coronary disease secondary prevention Peripheral  vascular disease secondary prevention BP soft -Continue atorvastatin, Zetia -Hold Imdur, metoprolol, ranolazine given hypotension  History of venous thromboembolism This was one time PE in 2018 -Hold Eliquis  Chronic systolic CHF Appears euvolemic, not on diuretics normally, BNP low,  Echo this hospital stay shows EF back to 40-45%.  Normal MV.    -Strict I/Os, daily weights -Consult Cardiology     Disposition: THe patient presented with acute hypoxic respiratory failure.  He has had evaluation by Cardiology who feel this is not cardiogenic, but differential remains somewhat unclear.  He still has significant desaturations with exertion, will remain inpatient.  Pulmonlogy are weighing bronchoscopy, possibly inpatient vs outpatient.  In the meantime, we will await his culture results, continue IV antibiotics and steroids.        MDM: The below labs and imaging reports reviewed and summarized above.  Medication management as above. This is a severe acute illness with threat to life.   DVT prophylaxis: SCDs  Code Status: Full code Family Communication: Wife at the bedside    Consultants:   Pulmonary  Cardiology  Procedures:   2/25 chest x-ray-multifocal pneumonia  2/26 echo -- EF 40-45%, normal valves  Antimicrobials:   Ceftriaxone and azithromycin 2/25>>  Culture data:   2/25 blood culture x2-no growth to date  2/25 sputum culture-pending          Subjective: Episode of respiratory distress last evening, resolved with nebulaized bronchodilator and steroids.  SpO2 requirements increasing. Still some intermittent atypical CP, central.  Still some scant hemoptysis.  No fever, orthopnea, leg swelling.  No confusion.  Objective: Vitals:   03/22/19 2039 03/23/19 0520 03/23/19 0742 03/23/19 0820  BP: 99/66 120/82  106/89  Pulse: 75 81  100  Resp: 20 19  (!) 26  Temp: 97.7 F (36.5 C) 97.8 F (36.6 C)  97.9 F (36.6 C)  TempSrc: Oral  Oral  Oral  SpO2: 98% 90% 91% 95%  Weight:      Height:        Intake/Output Summary (Last 24 hours) at 03/23/2019 0934 Last data filed at 03/23/2019 0536 Gross per 24 hour  Intake 343.22 ml  Output 1550 ml  Net -1206.78 ml   Filed Weights   03/20/19 1051  Weight: 76.2 kg    Examination: General appearance: elderly adult male, alert and in mild respiratory distress.   HEENT: Anicteric, conjunctiva pink, lids and lashes normal. No nasal deformity, discharge, epistaxis.  Lips moist, teeth normal. OP normal, no oral lesions.   Skin: Warm and dry.  No suspicious rashes or lesions. Cardiac: RRR, no murmurs appreciated.  JVP normal.  No LE edema.    Respiratory: Tachypneic.  Appears out of breath.  Rales at left base.  DIminished on right, no wheezing. Abdomen: Abdomen soft.  No TTP or guarding. No ascites, distension, hepatosplenomegaly.   MSK: No deformities or effusions of the large joints of the upper or lower extremities bilaterally. Neuro: Awake and alert. Naming is grossly intact, and the patient's recall, recent and remote, as well as general fund of knowledge seem within normal limits.  Muscle tone normal, without fasciculations.  Moves all extremities equally and with normal coordination.  Speech fluent.    Psych: Sensorium intact and responding to questions, attention normal. Affect anxious.  Judgment and insight appear normal.        Data Reviewed: I have personally reviewed following labs and imaging studies:  CBC: Recent Labs  Lab 03/20/19 1105 03/22/19 0352 03/23/19 0829  WBC 7.5 11.3* 7.1  HGB 14.1 11.9* 13.3  HCT 42.0 35.5* 40.0  MCV 95.5 94.9 94.6  PLT 315 304 [id number]   Basic Metabolic Panel: Recent Labs  Lab 03/20/19 1105 03/22/19 0352 03/23/19 0829  NA 135 138 139  K 4.5 4.3 4.2  CL 98 107 99  CO2 27 25 27   GLUCOSE 137* 144* 150*  BUN 18 28* 29*  CREATININE 1.21 0.95 1.06  CALCIUM 8.7* 8.3* 8.8*   GFR: Estimated Creatinine Clearance: 61.2 mL/min  (by C-G formula based on SCr of 1.06 mg/dL). Liver Function Tests: Recent Labs  Lab 03/20/19 1146  AST 22  ALT 15  ALKPHOS 55  BILITOT 1.0  PROT 7.7  ALBUMIN 3.1*   No results for input(s): LIPASE, AMYLASE in the last 168 hours. No results for input(s): AMMONIA in the last 168 hours. Coagulation Profile: No results for input(s): INR, PROTIME in the last 168 hours. Cardiac Enzymes: No results for input(s): CKTOTAL, CKMB, CKMBINDEX, TROPONINI in the last 168 hours. BNP (last 3 results) No results for input(s): PROBNP in the last 8760 hours. HbA1C: No results for input(s): HGBA1C in the last 72 hours. CBG: No results for input(s): GLUCAP in the last 168 hours. Lipid Profile: No results for input(s): CHOL, HDL, LDLCALC, TRIG, CHOLHDL, LDLDIRECT in the last 72 hours. Thyroid Function Tests: No results for input(s): TSH, T4TOTAL, FREET4, T3FREE, THYROIDAB in the last 72 hours. Anemia Panel: No results for input(s): VITAMINB12, FOLATE, FERRITIN, TIBC, IRON, RETICCTPCT in the last 72 hours. Urine analysis:    Component Value Date/Time   COLORURINE YELLOW (  A) 12/08/2016 0938   APPEARANCEUR Hazy (A) 09/24/2018 1108   LABSPEC 1.015 12/08/2016 0938   PHURINE 6.0 12/08/2016 0938   GLUCOSEU Negative 09/24/2018 1108   HGBUR SMALL (A) 12/08/2016 0938   BILIRUBINUR Negative 09/24/2018 [place] 12/08/2016 0938   PROTEINUR Negative 09/24/2018 1108   PROTEINUR NEGATIVE 12/08/2016 0938   NITRITE Negative 09/24/2018 1108   NITRITE NEGATIVE 12/08/2016 0938   LEUKOCYTESUR Negative 09/24/2018 1108   Sepsis Labs: @LABRCNTIP (procalcitonin:4,lacticacidven:4)  ) Recent Results (from the past 240 hour(s))  SARS CORONAVIRUS 2 (TAT 6-24 HRS) Nasopharyngeal Nasopharyngeal Swab     Status: None   Collection Time: 03/20/19  1:21 PM   Specimen: Nasopharyngeal Swab  Result Value Ref Range Status   SARS Coronavirus 2 NEGATIVE NEGATIVE Final    Comment: (NOTE) SARS-CoV-2 target  nucleic acids are NOT DETECTED. The SARS-CoV-2 RNA is generally detectable in upper and lower respiratory specimens during the acute phase of infection. Negative results do not preclude SARS-CoV-2 infection, do not rule out co-infections with other pathogens, and should not be used as the sole basis for treatment or other patient management decisions. Negative results must be combined with clinical observations, patient history, and epidemiological information. The expected result is Negative. Fact Sheet for Patients: SugarRoll.be Fact Sheet for Healthcare Providers: https://www.woods-mathews.com/ This test is not yet approved or cleared by the [place] FDA and  has been authorized for detection and/or diagnosis of SARS-CoV-2 by FDA under an Emergency Use Authorization (EUA). This EUA will remain  in effect (meaning this test can be used) for the duration of the COVID-19 declaration under Section 56 4(b)(1) of the Act, 21 U.S.C. section 360bbb-3(b)(1), unless the authorization is terminated or revoked sooner. Performed at [facility], [place] [address] [place], [place] [address]   CULTURE, BLOOD (ROUTINE X 2) w Reflex to ID Panel     Status: None (Preliminary result)   Collection Time: 03/20/19  5:48 PM   Specimen: BLOOD  Result Value Ref Range Status   Specimen Description BLOOD BLOOD RIGHT HAND  Final   Special Requests   Final    BOTTLES DRAWN AEROBIC AND ANAEROBIC Blood Culture adequate volume   Culture   Final    NO GROWTH 3 DAYS Performed at [facility], [address], [place], [place] [address]    Report Status PENDING  Incomplete  CULTURE, BLOOD (ROUTINE X 2) w Reflex to ID Panel     Status: None (Preliminary result)   Collection Time: 03/20/19  5:48 PM   Specimen: BLOOD  Result Value Ref Range Status   Specimen Description BLOOD RIGHT ANTECUBITAL  Final   Special Requests   Final    BOTTLES DRAWN AEROBIC  AND ANAEROBIC Blood Culture adequate volume   Culture   Final    NO GROWTH 3 DAYS Performed at [facility], [address] [place], [place] [address]    Report Status PENDING  Incomplete  Culture, sputum-assessment     Status: None   Collection Time: 03/20/19  9:45 PM   Specimen: Expectorated Sputum  Result Value Ref Range Status   Specimen Description EXPECTORATED SPUTUM  Final   Special Requests NONE  Final   Sputum evaluation   Final    THIS SPECIMEN IS ACCEPTABLE FOR SPUTUM CULTURE Performed at [facility], [address] [place], [place] [address]    Report Status 03/20/2019 FINAL  Final  Culture, respiratory     Status: None (Preliminary result)  Collection Time: 03/20/19  9:45 PM  Result Value Ref Range Status   Specimen Description   Final    EXPECTORATED SPUTUM Performed at [facility], [address] [place], [place] [address]    Special Requests   Final    NONE Reflexed from [phone] Performed at [facility], [address] [place], [place] [address]    Gram Stain   Final    MODERATE WBC PRESENT,BOTH PMN AND MONONUCLEAR MODERATE GRAM POSITIVE COCCI MODERATE GRAM NEGATIVE RODS FEW GRAM VARIABLE ROD FEW SQUAMOUS EPITHELIAL CELLS PRESENT    Culture   Final    CULTURE REINCUBATED FOR BETTER GROWTH Performed at [facility], [place] [address], [place], [place] [address]    Report Status PENDING  Incomplete  Expectorated sputum assessment w rflx to resp cult     Status: None   Collection Time: 03/21/19  7:21 PM   Specimen: Sputum  Result Value Ref Range Status   Specimen Description SPU  Final   Special Requests Normal  Final   Sputum evaluation   Final    Sputum specimen not acceptable for testing.  Please recollect.   NOTIFIED [facility] 03/21/19 AT 2027 HS Performed at [facility], [address] [place], [place] [address]    Report Status 03/21/2019 FINAL  Final         Radiology  Studies: ECHOCARDIOGRAM COMPLETE  Result Date: 03/22/2019    ECHOCARDIOGRAM REPORT   Patient Name:   HAKAN BRACKEEN Date of Exam: 03/21/2019 Medical Rec #:  [id number]      Height:       70.0 in Accession #:    [id number]     Weight:       168.0 lb Date of Birth:  [birth year]       BSA:          1.938 m Patient Age:    47 years       BP:           113/59 mmHg Patient Gender: M              HR:           81 bpm. Exam Location:  [facility] Procedure: 2D Echo Indications:     Mitral Valve Insufficiency 424.0/134.0  History:         Patient has prior history of Echocardiogram examinations, most                  recent 10/02/2017. Prior CABG, Signs/Symptoms:Shortness of                  Breath and Chest Pain; Risk Factors:Dyslipidemia.  Sonographer:     Avanell Shackleton Referring Phys:  [id number] Ottie Glazier Diagnosing Phys: Isaias Cowman MD  Sonographer Comments: Technically difficult study due to poor echo windows. IMPRESSIONS  1. Left ventricular ejection fraction, by estimation, is 40 to 45%. The left ventricle has mildly decreased function. The left ventricle has no regional wall motion abnormalities. Left ventricular diastolic parameters are consistent with Grade I diastolic dysfunction (impaired relaxation).  2. Right ventricular systolic function is normal. The right ventricular size is normal.  3. The mitral valve is normal in structure and function. Mild mitral valve regurgitation. No evidence of mitral stenosis.  4. The aortic valve is normal in structure and function. Aortic valve regurgitation is not visualized. No aortic stenosis is present.  5. The inferior vena cava is normal in size with greater  than 50% respiratory variability, suggesting right atrial pressure of 3 mmHg. FINDINGS  Left Ventricle: Left ventricular ejection fraction, by estimation, is 40 to 45%. The left ventricle has mildly decreased function. The left ventricle has no regional wall motion abnormalities. The left ventricular internal cavity  size was normal in size. There is no left ventricular hypertrophy. Left ventricular diastolic parameters are consistent with Grade I diastolic dysfunction (impaired relaxation). Right Ventricle: The right ventricular size is normal. No increase in right ventricular wall thickness. Right ventricular systolic function is normal. Left Atrium: Left atrial size was normal in size. Right Atrium: Right atrial size was normal in size. Pericardium: There is no evidence of pericardial effusion. Mitral Valve: The mitral valve is normal in structure and function. Normal mobility of the mitral valve leaflets. Mild mitral valve regurgitation. No evidence of mitral valve stenosis. Tricuspid Valve: The tricuspid valve is normal in structure. Tricuspid valve regurgitation is mild . No evidence of tricuspid stenosis. Aortic Valve: The aortic valve is normal in structure and function. Aortic valve regurgitation is not visualized. No aortic stenosis is present. Pulmonic Valve: The pulmonic valve was normal in structure. Pulmonic valve regurgitation is not visualized. No evidence of pulmonic stenosis. Aorta: The aortic root is normal in size and structure. Venous: The inferior vena cava is normal in size with greater than 50% respiratory variability, suggesting right atrial pressure of 3 mmHg. IAS/Shunts: No atrial level shunt detected by color flow Doppler.  LEFT VENTRICLE PLAX 2D LVIDd:         5.33 cm      Diastology LVIDs:         4.54 cm      LV e' lateral:   11.10 cm/s LV PW:         0.90 cm      LV E/e' lateral: 4.8 LV IVS:        1.01 cm      LV e' medial:    6.96 cm/s LVOT diam:     1.80 cm      LV E/e' medial:  7.7 LVOT Area:     2.54 cm  LV Volumes (MOD) LV vol d, MOD A2C: 148.0 ml LV vol d, MOD A4C: 141.0 ml LV vol s, MOD A2C: 88.0 ml LV vol s, MOD A4C: 85.7 ml LV SV MOD A2C:     60.0 ml LV SV MOD A4C:     141.0 ml LV SV MOD BP:      59.4 ml RIGHT VENTRICLE            IVC RV S prime:     8.27 cm/s  IVC diam: 1.98 cm LEFT  ATRIUM             Index LA diam:        4.70 cm 2.42 cm/m LA Vol (A2C):   42.7 ml 22.03 ml/m LA Vol (A4C):   46.0 ml 23.73 ml/m LA Biplane Vol: 45.4 ml 23.42 ml/m   AORTA Ao Root diam: 3.30 cm MITRAL VALVE MV Area (PHT): 3.21 cm    SHUNTS MV Decel Time: 236 msec    Systemic Diam: 1.80 cm MV E velocity: 53.80 cm/s MV A velocity: 84.90 cm/s MV E/A ratio:  0.63 Isaias Cowman MD Electronically signed by Isaias Cowman MD Signature Date/Time: 03/22/2019/12:37:17 PM    Final         Scheduled Meds: . vitamin C  500 mg Oral Daily  . atorvastatin  80 mg Oral q1800  .  azithromycin  500 mg Oral Daily  . dextromethorphan-guaiFENesin  1 tablet Oral BID  . ezetimibe  10 mg Oral Daily  . feeding supplement (ENSURE ENLIVE)  237 mL Oral BID BM  . ipratropium-albuterol  3 mL Nebulization TID  . methylPREDNISolone (SOLU-MEDROL) injection  40 mg Intravenous Q12H  . mometasone-formoterol  2 puff Inhalation BID  . sodium chloride flush  3 mL Intravenous Once  . zinc sulfate  220 mg Oral Daily   Continuous Infusions: . sodium chloride 5 mL/hr at 03/22/19 0935  . cefTRIAXone (ROCEPHIN)  IV Stopped (03/22/19 0900)     LOS: 3 days    Time spent:35  minutes    Edwin Dada, MD [facility] 03/23/2019, 9:34 AM     Please page though Livengood or Epic secure chat:  For [employer], Adult nurse

## 2019-03-23 NOTE — Progress Notes (Signed)
   03/23/19 1020  Clinical Encounter Type  Visited With Patient and family together  Visit Type Follow-up;Spiritual support  Referral From Chaplain  Consult/Referral To [place] visited with patient and wife. Patient's wife was on her cell phone when Chaplain knocked on the second door, but patient waved, telling her to come in.  Patient said he had a rough night, but was better now. Patient said that it will be a while before he is all better and that he must do what the doctors are telling him if he wants to be around. We discussed him taking things slow and easy and not rushing to be discharged.  Patient spoke again about one of his favorite authors. After talking for a while, Chaplain mentioned prayer and patient beckoned Chaplain to come to the right side of the bed and grabbed her hand. He needed to hold Chaplain's hand during prayer. Although we are social distancing Chaplain could not pull her hand away from  patient, therefore she started praying. After praying Chaplain explained to Mrs. Ovando that she is available for her as well as her husband and that if she needed anything or needed to talk that she would be available for her. Chaplain offered pastoral presence, empathy, and prayer.

## 2019-03-24 ENCOUNTER — Ambulatory Visit: Payer: Medicare HMO | Admitting: Urology

## 2019-03-24 LAB — COMPREHENSIVE METABOLIC PANEL
ALT: 21 U/L (ref 0–44)
AST: 20 U/L (ref 15–41)
Albumin: 2.7 g/dL — ABNORMAL LOW (ref 3.5–5.0)
Alkaline Phosphatase: 49 U/L (ref 38–126)
Anion gap: 9 (ref 5–15)
BUN: 28 mg/dL — ABNORMAL HIGH (ref 8–23)
CO2: 26 mmol/L (ref 22–32)
Calcium: 8.5 mg/dL — ABNORMAL LOW (ref 8.9–10.3)
Chloride: 102 mmol/L (ref 98–111)
Creatinine, Ser: 0.93 mg/dL (ref 0.61–1.24)
GFR calc Af Amer: >60 mL/min (ref >60)
GFR calc non Af Amer: >60 mL/min (ref >60)
Glucose, Bld: 138 mg/dL — ABNORMAL HIGH (ref 70–99)
Potassium: 4.7 mmol/L (ref 3.5–5.1)
Sodium: 137 mmol/L (ref 135–145)
Total Bilirubin: 0.6 mg/dL (ref 0.3–1.2)
Total Protein: 6.8 g/dL (ref 6.5–8.1)

## 2019-03-24 LAB — CBC
HCT: 39.3 % (ref 39.0–52.0)
Hemoglobin: 13.1 g/dL (ref 13.0–17.0)
MCH: 31.3 pg (ref 26.0–34.0)
MCHC: 33.3 g/dL (ref 30.0–36.0)
MCV: 93.8 fL (ref 80.0–100.0)
Platelets: 326 10*3/uL (ref 150–400)
RBC: 4.19 MIL/uL — ABNORMAL LOW (ref 4.22–5.81)
RDW: 14.3 % (ref 11.5–15.5)
WBC: 11.1 10*3/uL — ABNORMAL HIGH (ref 4.0–10.5)
nRBC: 0 % (ref 0.0–0.2)

## 2019-03-24 MED ORDER — FUROSEMIDE 10 MG/ML IJ SOLN
20.0000 mg | Freq: Every day | INTRAMUSCULAR | Status: DC
  Administered 2019-03-25: 20 mg via INTRAVENOUS
  Filled 2019-03-24: qty 4

## 2019-03-24 NOTE — NC FL2 (Signed)
[place] LEVEL OF CARE SCREENING TOOL     IDENTIFICATION  Patient Name: Scott [name] Birthdate: 02-Nov-1940 Sex: male Admission Date (Current Location): 03/20/2019  [place] and [place] Number:  Engineering geologist and Address:  [facility], [address] [place], [place] [address]      Provider Number: [id number]  Attending Physician Name and Address:  Wyvonnia Dusky, MD  Relative Name and Phone Number:  Ido Glasson [phone]    Current Level of Care: Hospital Recommended Level of Care: [place] Prior Approval Number:    Date Approved/Denied:   PASRR Number:    Discharge Plan: SNF    Current Diagnoses: Patient Active Problem List   Diagnosis Date Noted  . Multifocal pneumonia 03/20/2019  . CAD (coronary artery disease) 03/20/2019  . Acute on chronic respiratory failure with hypoxia ([place])   . Postural dizziness with near syncope 01/07/2019  . 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 Weakness 01/05/2019  . Dizziness 12/07/2017  . Orthostatic hypotension 12/07/2017  . Chest pain 10/02/2017  . 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 Iron deficiency anemia due to chronic blood loss 09/11/2016  . Pulmonary embolus ([place]) 08/31/2016  . History of pulmonary embolism 08/16/2016  . 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 Panic disorder   . 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 Panic attack 08/04/2016  . 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 Coronary artery disease involving native coronary artery of native heart with angina pectoris with documented spasm ([place])   . Hx of CABG   . Ischemic cardiomyopathy   . Chronic systolic CHF (congestive heart failure) ([place])   . Chronic Chest Pain   . Coronary artery disease involving coronary bypass graft of native heart with angina pectoris with documented spasm ([place])   . Angina pectoris ([place])   . Anxiety   . Hyperlipemia   . Chronic  diastolic heart failure ([place]) 12/21/2014  . Unstable angina ([place]) 12/08/2014  . PAF (paroxysmal atrial fibrillation) ([place]) 12/08/2014  . Chronic anticoagulation 12/08/2014  . Anemia 04/20/2014  . 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 Cardiomyopathy, ischemic   . Essential hypertension   . Bilateral carotid artery disease ([place])   . S/P drug eluting coronary stent placement 05/30/2013  . Chest pain with low risk for cardiac etiology 05/05/2013  . SOB (shortness of breath) 05/05/2013  . Coronary artery disease involving coronary bypass graft of native heart with angina pectoris ([place]) 05/05/2013  . Hyperlipidemia 05/05/2013  . History of smoking 05/05/2013  . Cardiac angina ( [place]) 05/05/2013    Orientation RESPIRATION BLADDER Height & Weight     Self, Time, Situation, Place  O2(4L) Continent Weight: 76.2 kg Height:  5\' 10"  (177.8 cm)  BEHAVIORAL SYMPTOMS/MOOD NEUROLOGICAL BOWEL NUTRITION STATUS      Continent Diet(Heart Healthy)  AMBULATORY STATUS COMMUNICATION OF NEEDS Skin   Extensive Assist Verbally Normal                       Personal Care Assistance Level of Assistance  Bathing, Feeding, Dressing Bathing Assistance: Limited assistance Feeding assistance: Limited assistance Dressing Assistance: Limited assistance     Functional Limitations Info             SPECIAL CARE FACTORS FREQUENCY  OT (By licensed OT), PT (By licensed PT)                    Contractures Contractures Info: Not present  Additional Factors Info  Code Status, Allergies Code Status Info: Full Allergies Info: Contrast Dye, Oxycodone, Vicodin, Metrizamide           Current Medications (03/24/2019):  This is the current hospital active medication list Current Facility-Administered Medications  Medication Dose Route Frequency Provider Last Rate Last Admin  . 0.9 %  sodium chloride infusion   Intravenous PRN Ivor Costa, MD   Stopped at 03/23/19 1320  . acetaminophen (TYLENOL)  tablet 650 mg  650 mg Oral Q6H PRN Ivor Costa, MD   650 mg at 03/22/19 1052  . albuterol (PROVENTIL) (2.5 MG/3ML) 0.083% nebulizer solution 2.5 mg  2.5 mg Nebulization Q4H PRN Edwin Dada, MD   2.5 mg at 03/23/19 0553  . atorvastatin (LIPITOR) tablet 80 mg  80 mg Oral q1800 Ivor Costa, MD   80 mg at 03/23/19 1837  . colchicine tablet 0.6 mg  0.6 mg Oral Daily Aleskerov, Fuad, MD   0.6 mg at 03/24/19 0930  . ezetimibe (ZETIA) tablet 10 mg  10 mg Oral Daily Ivor Costa, MD   10 mg at 03/24/19 0930  . feeding supplement (ENSURE ENLIVE) (ENSURE ENLIVE) liquid 237 mL  237 mL Oral BID BM Danford, Suann Larry, MD   237 mL at 03/23/19 1400  . furosemide (LASIX) injection 20 mg  20 mg Intravenous Daily Aleskerov, Fuad, MD      . ipratropium-albuterol (DUONEB) 0.5-2.5 (3) MG/3ML nebulizer solution 3 mL  3 mL Nebulization TID Edwin Dada, MD   3 mL at 03/24/19 1357  . mometasone-formoterol (DULERA) 100-5 MCG/ACT inhaler 2 puff  2 puff Inhalation BID Ottie Glazier, MD   2 puff at 03/24/19 0930  . nitroGLYCERIN (NITROSTAT) SL tablet 0.4 mg  0.4 mg Sublingual Q5 min PRN Ivor Costa, MD      . ondansetron [facility]) injection 4 mg  4 mg Intravenous Q8H PRN Ivor Costa, MD      . predniSONE (DELTASONE) tablet 20 mg  20 mg Oral Q breakfast Ottie Glazier, MD   20 mg at 03/23/19 1837  . sodium chloride flush (NS) 0.9 % injection 3 mL  3 mL Intravenous Once Ivor Costa, MD      . sulfamethoxazole-trimethoprim (BACTRIM) 400-80 MG per tablet 1 tablet  1 tablet Oral Q12H Ottie Glazier, MD   1 tablet at 03/24/19 0930     Discharge Medications: Please see discharge summary for a list of discharge medications.  Relevant Imaging Results:  Relevant Lab Results:   Additional Information    Shelbie Ammons, RN

## 2019-03-24 NOTE — Progress Notes (Signed)
PROGRESS NOTE    Scott [name]  [id number] DOB: Aug 14, 1940 DOA: 03/20/2019 PCP: Baxter Hire, MD      Assessment & Plan:   Principal Problem:   Multifocal pneumonia Active Problems:   Essential hypertension   PAF (paroxysmal atrial fibrillation) (HCC)   Chronic diastolic heart failure (HCC)   Hyperlipemia   Pulmonary embolus (HCC)   CAD (coronary artery disease)   Acute on chronic hypoxic respiratory failure: initially thought to be CAP or COVID, started on empiric antibiotics, remdesivir and steroids.  COVID PCR subsequently negative.  Remdesivir stopped. Strep pneumo and legionella antigens negative. DDx: pulmonary hemorrhage, COVID fibrosis, atypical/fungal pneumonia. IgG is positive for COVID. Hold all anticoagulation,cardiology prefer to revisit this after an interval, in the office. Changed abxs to po bactrim as per pumon & continue on IV steroids. Bronchoscopy in chronic stable state on an outpatient basis as per pulmon. Pulmon following and recs apprec  PAF: continue hold metoprolol due to hypotension. 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 Continue to hold eliquis  Chronic systolic CHF: continue on IV lasix x today. Echo shows EF back to 40-45%. Strict I/Os, daily weights     DVT prophylaxis: SCDs Code Status: full Family Communication:  Disposition Plan:  Will likely need several days more as pt clinically improves   Consultants:   pulmon   Procedures:   Antimicrobials: bactrim   Subjective: Pt c/o shortness of breath   Objective: Vitals:   03/23/19 1946 03/23/19 2003 03/24/19 0507 03/24/19 0738  BP:  (!) 104/59 128/69   Pulse:  90 77   Resp:  20 (!) 22   Temp:  98.6 F (37 C) 98.1 F (36.7 C)   TempSrc:  Oral Oral   SpO2: 94% 97% 94% 91%  Weight:      Height:         Intake/Output Summary (Last 24 hours) at 03/24/2019 0809 Last data filed at 03/24/2019 0655 Gross per 24 hour  Intake 332.14 ml  Output 1500 ml  Net -1167.86 ml   Filed Weights   03/20/19 1051  Weight: 76.2 kg    Examination:  General exam: Appears calm and comfortable  Respiratory system: diminished breath sounds b/l Cardiovascular system: S1 & S2 +. No rubs, gallops or clicks.  Gastrointestinal system: Abdomen is nondistended, soft and nontender.  Normal bowel sounds heard. Central nervous system: Alert and oriented. Moves all 4 extremities Psychiatry: Judgement and insight appear normal. Flat mood and affect     Data Reviewed: I have personally reviewed following labs and imaging studies  CBC: Recent Labs  Lab 03/20/19 1105 03/22/19 0352 03/23/19 0829 03/24/19 0458  WBC 7.5 11.3* 7.1 11.1*  HGB 14.1 11.9* 13.3 13.1  HCT 42.0 35.5* 40.0 39.3  MCV 95.5 94.9 94.6 93.8  PLT 315 304 320 [id number]   Basic Metabolic Panel: Recent Labs  Lab 03/20/19 1105 03/22/19 0352 03/23/19 0829 03/24/19 0458  NA 135 138 139 137  K 4.5 4.3 4.2 4.7  CL 98 107 99 102  CO2 27 25 27 26   GLUCOSE 137* 144* 150* 138*  BUN 18 28* 29* 28*  CREATININE 1.21 0.95 1.06 0.93  CALCIUM 8.7* 8.3* 8.8* 8.5*   GFR: Estimated Creatinine Clearance: 69.8 mL/min (by C-G formula based on SCr of 0.93 mg/dL). Liver Function Tests: Recent Labs  Lab 03/20/19 1146 03/24/19  0458  AST 22 20  ALT 15 21  ALKPHOS 55 49  BILITOT 1.0 0.6  PROT 7.7 6.8  ALBUMIN 3.1* 2.7*   No results for input(s): LIPASE, AMYLASE in the last 168 hours. No results for input(s): AMMONIA in the last 168 hours. Coagulation Profile: No results for input(s): INR, PROTIME in the last 168 hours. Cardiac Enzymes: No results for input(s): CKTOTAL, CKMB, CKMBINDEX, TROPONINI in the last 168 hours. BNP (last 3 results) No results for input(s): PROBNP in the last 8760 hours. HbA1C: No results for input(s): HGBA1C in the last 72  hours. CBG: No results for input(s): GLUCAP in the last 168 hours. Lipid Profile: No results for input(s): CHOL, HDL, LDLCALC, TRIG, CHOLHDL, LDLDIRECT in the last 72 hours. Thyroid Function Tests: No results for input(s): TSH, T4TOTAL, FREET4, T3FREE, THYROIDAB in the last 72 hours. Anemia Panel: No results for input(s): VITAMINB12, FOLATE, FERRITIN, TIBC, IRON, RETICCTPCT in the last 72 hours. Sepsis Labs: Recent Labs  Lab 03/20/19 1322 03/22/19 0352  PROCALCITON <0.10 0.12    Recent Results (from the past 240 hour(s))  SARS CORONAVIRUS 2 (TAT 6-24 HRS) Nasopharyngeal Nasopharyngeal Swab     Status: None   Collection Time: 03/20/19  1:21 PM   Specimen: Nasopharyngeal Swab  Result Value Ref Range Status   SARS Coronavirus 2 NEGATIVE NEGATIVE Final    Comment: (NOTE) SARS-CoV-2 target nucleic acids are NOT DETECTED. The SARS-CoV-2 RNA is generally detectable in upper and lower respiratory specimens during the acute phase of infection. Negative results do not preclude SARS-CoV-2 infection, do not rule out co-infections with other pathogens, and should not be used as the sole basis for treatment or other patient management decisions. Negative results must be combined with clinical observations, patient history, and epidemiological information. The expected result is Negative. Fact Sheet for Patients: SugarRoll.be Fact Sheet for Healthcare Providers: https://www.woods-mathews.com/ This test is not yet approved or cleared by the [place] FDA and  has been authorized for detection and/or diagnosis of SARS-CoV-2 by FDA under an Emergency Use Authorization (EUA). This EUA will remain  in effect (meaning this test can be used) for the duration of the COVID-19 declaration under Section 56 4(b)(1) of the Act, 21 U.S.C. section 360bbb-3(b)(1), unless the authorization is terminated or revoked sooner. Performed at [facility],  [place] [address], [place], [place] [address]   CULTURE, BLOOD (ROUTINE X 2) w Reflex to ID Panel     Status: None (Preliminary result)   Collection Time: 03/20/19  5:48 PM   Specimen: BLOOD  Result Value Ref Range Status   Specimen Description BLOOD BLOOD RIGHT HAND  Final   Special Requests   Final    BOTTLES DRAWN AEROBIC AND ANAEROBIC Blood Culture adequate volume   Culture   Final    NO GROWTH 3 DAYS Performed at [facility], [address], [place], [place] [address]    Report Status PENDING  Incomplete  CULTURE, BLOOD (ROUTINE X 2) w Reflex to ID Panel     Status: None (Preliminary result)   Collection Time: 03/20/19  5:48 PM   Specimen: BLOOD  Result Value Ref Range Status   Specimen Description BLOOD RIGHT ANTECUBITAL  Final   Special Requests   Final    BOTTLES DRAWN AEROBIC AND ANAEROBIC Blood Culture adequate volume   Culture   Final    NO GROWTH 3 DAYS Performed at [facility], [address] [place], [place] [address]    Report  Status PENDING  Incomplete  Culture, sputum-assessment     Status: None   Collection Time: 03/20/19  9:45 PM   Specimen: Expectorated Sputum  Result Value Ref Range Status   Specimen Description EXPECTORATED SPUTUM  Final   Special Requests NONE  Final   Sputum evaluation   Final    THIS SPECIMEN IS ACCEPTABLE FOR SPUTUM CULTURE Performed at [facility], [address], [place], [place] [address]    Report Status 03/20/2019 FINAL  Final  Culture, respiratory     Status: None   Collection Time: 03/20/19  9:45 PM  Result Value Ref Range Status   Specimen Description   Final    EXPECTORATED SPUTUM Performed at [facility], [address] [place], [place] [address]    Special Requests   Final    NONE Reflexed from [id number] Performed at [facility], [address] [place], [place] [address]    Gram Stain   Final    MODERATE WBC PRESENT,BOTH PMN AND MONONUCLEAR MODERATE GRAM POSITIVE  COCCI MODERATE GRAM NEGATIVE RODS FEW GRAM VARIABLE ROD FEW SQUAMOUS EPITHELIAL CELLS PRESENT    Culture   Final    Consistent with normal respiratory flora. Performed at [facility], [place] [address], [place], [place] [address]    Report Status 03/23/2019 FINAL  Final  Expectorated sputum assessment w rflx to resp cult     Status: None   Collection Time: 03/21/19  7:21 PM   Specimen: Sputum  Result Value Ref Range Status   Specimen Description SPU  Final   Special Requests Normal  Final   Sputum evaluation   Final    Sputum specimen not acceptable for testing.  Please recollect.   NOTIFIED [facility] 03/21/19 AT 2027 HS Performed at [facility] : [facility], [address], [place], [place] [address]    Report Status 03/21/2019 FINAL  Final         Radiology Studies: No results found.      Scheduled Meds: . vitamin C  500 mg Oral Daily  . atorvastatin  80 mg Oral q1800  . colchicine  0.6 mg Oral Daily  . dextromethorphan-guaiFENesin  1 tablet Oral BID  . ezetimibe  10 mg Oral Daily  . feeding supplement (ENSURE ENLIVE)  237 mL Oral BID BM  . ipratropium-albuterol  3 mL Nebulization TID  . mometasone-formoterol  2 puff Inhalation BID  . predniSONE  20 mg Oral Q breakfast  . sodium chloride flush  3 mL Intravenous Once  . sulfamethoxazole-trimethoprim  1 tablet Oral Q12H  . zinc sulfate  220 mg Oral Daily   Continuous Infusions: . sodium chloride Stopped (03/23/19 1320)     LOS: 4 days    Time spent: 33 mins    Wyvonnia Dusky, MD [facility] Pager [phone]  If 7PM-7AM, please contact night-coverage [email or web address] 03/24/2019, 8:09 AM

## 2019-03-24 NOTE — TOC Initial Note (Signed)
Transition of Care [facility]) - Initial/Assessment Note    Patient Details  Name: Scott [name] MRN: [id number] Date of Birth: [birth year]  [facility] CM/SW Contact:    Shelbie Ammons, RN Phone Number: 03/24/2019, 2:51 PM  Clinical Narrative:   RNCM assessed patient at bedside, Patient had just completed PT and was sitting up in chair. Wife present in room for assessment. Patient admitted to hospital due to respiratory failure and is currently on 4L of oxygen. 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 Discussed that PT is recommending he go to rehab for a short term after discharge. Initially he was not interested however after talking with his wife he was agreeable to at least starting a search however he hopes to improve to the point he does not need to go.  RNCM completed, PASSR, FL2 and intiated bed search. RNCM will continue to follow.           Expected Discharge Plan: Skilled Nursing Facility Barriers to Discharge: Continued Medical Work up   Patient Goals and CMS Choice        Expected Discharge Plan and Services Expected Discharge Plan: [place]   Discharge Planning Services: CM Consult   Living arrangements for the past 2 months: Single Family Home                                      Prior Living Arrangements/Services Living arrangements for the past 2 months: Single Family Home Lives with:: Spouse Patient language and need for interpreter reviewed:: Yes        Need for Family Participation in Patient Care: Yes (Comment) Care giver support system in place?: Yes (comment)   Criminal Activity/Legal Involvement Pertinent to Current Situation/Hospitalization: No - Comment as needed  Activities of Daily Living Home Assistive Devices/Equipment: Oxygen ADL Screening (condition at time of admission) Patient's cognitive ability adequate to safely complete daily activities?: Yes Is the patient deaf  or have difficulty hearing?: No Does the patient have difficulty seeing, even when wearing glasses/contacts?: No Does the patient have difficulty concentrating, remembering, or making decisions?: No Patient able to express need for assistance with ADLs?: Yes Does the patient have difficulty dressing or bathing?: No Independently performs ADLs?: Yes (appropriate for developmental age) Does the patient have difficulty walking or climbing stairs?: No Weakness of Legs: None Weakness of Arms/Hands: None  Permission Sought/Granted Permission sought to share information with : Family Supports    Share Information with NAME: Lauretta Chester           Emotional Assessment Appearance:: Appears stated age Attitude/Demeanor/Rapport: Apprehensive Affect (typically observed): Appropriate Orientation: : Oriented to Self, Oriented to Place, Oriented to  Time, Oriented to Situation Alcohol / Substance Use: Not Applicable Psych Involvement: No (comment)  Admission diagnosis:  SOB (shortness of breath) [R06.02] Acute on chronic respiratory failure with hypoxia (HCC) [J96.21] Multifocal pneumonia [J18.9] Patient Active Problem List   Diagnosis Date Noted  . Multifocal pneumonia 03/20/2019  . CAD (coronary artery disease) 03/20/2019  . Acute on chronic respiratory failure with hypoxia ([place])   . Postural dizziness with near syncope 01/07/2019  . 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 Weakness 01/05/2019  . Dizziness 12/07/2017  . Orthostatic hypotension 12/07/2017  . Chest pain 10/02/2017  . 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 Iron deficiency anemia due to chronic blood  loss 09/11/2016  . Pulmonary embolus ([place]) 08/31/2016  . History of pulmonary embolism 08/16/2016  . 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 Panic disorder   . 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 Panic attack 08/04/2016  . 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 Coronary artery disease  involving native coronary artery of native heart with angina pectoris with documented spasm ([place])   . Hx of CABG   . Ischemic cardiomyopathy   . Chronic systolic CHF (congestive heart failure) ([place])   . Chronic Chest Pain   . Coronary artery disease involving coronary bypass graft of native heart with angina pectoris with documented spasm ([place])   . Angina pectoris ([place])   . Anxiety   . Hyperlipemia   . Chronic diastolic heart failure ([place]) 12/21/2014  . Unstable angina ([place]) 12/08/2014  . PAF (paroxysmal atrial fibrillation) ([place]) 12/08/2014  . Chronic anticoagulation 12/08/2014  . Anemia 04/20/2014  . 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 Cardiomyopathy, ischemic   . Essential hypertension   . Bilateral carotid artery disease ([place])   . S/P drug eluting coronary stent placement 05/30/2013  . Chest pain with low risk for cardiac etiology 05/05/2013  . SOB (shortness of breath) 05/05/2013  . Coronary artery disease involving coronary bypass graft of native heart with angina pectoris ([place]) 05/05/2013  . Hyperlipidemia 05/05/2013  . History of smoking 05/05/2013  . Cardiac angina ([place]) 05/05/2013   PCP:  Baxter Hire, MD Pharmacy:   [facility] [address], [place] [address] Phone: [phone] Fax: [place], [place] [address] Phone: [phone] Fax: [phone]     Social Determinants of Health (SDOH) Interventions    Readmission Risk Interventions No flowsheet data found.

## 2019-03-24 NOTE — Progress Notes (Signed)
Physical Therapy Treatment Patient Details Name: Scott [name] MRN: [id number] DOB: 10/06/1940 Today's Date: 03/24/2019    History of Present Illness Scott [name] is a 79 y.o. M with CAD last PCI >52yr, ischemic CM, sCHF EF 55% with chronic respiratory failure on 3L at baseline, PVD, HTN, and hx PE on Eliquis who presented with fever, cough, SOB for 1 week. Patient reports over the past 6 weeks his pulmonologist placed him on 6L of O2 and he has been using a RW at home for stability since, becauase he gets "dizzy" with his SOB    PT Comments    Pt alert, in bed, family at bedside. Pt stated he had gotten up with RN this AM, PT called and confirmed, at that time pt desaturated to 70s, recovered within 1-2 minutes. On 4L via  throughout session, spO2 monitored continuously.  Pt instructed in ankle pumps/heel slides prior to mobility to assess spO2 response to exertion, >90%. Pt mobilized to EOB independently, able to sit 2-3 minutes without desaturation, pt asymptomatic. Sit <> stand with supervision, ~30secs and then transferred to chair with use of chair arm, without desaturation. Pt up in chair, all needs in reach. PT and pt discussed benefits of being out of bed, agreeable to eat lunch in chair and call RN if/when he wanted to return to bed. Family and other staff in room at end of session. The patient would benefit from further skilled PT intervention to maximize mobility, safety, endurance/activity tolerance, and independence. Current recommendation remains appropriate pending pt progress.     Follow Up Recommendations  SNF     Equipment Recommendations  Rolling walker with 5" wheels    Recommendations for Other Services       Precautions / Restrictions Precautions Precautions: Fall Restrictions Weight Bearing Restrictions: No    Mobility  Bed Mobility Overal bed mobility: Independent                Transfers Overall transfer level: Modified independent   Transfers: Sit  to/from Stand;Stand Pivot Transfers Sit to Stand: Modified independent (Device/Increase time) Stand pivot transfers: Modified independent (Device/Increase time)       General transfer comment: use of bed rails/chair arms to complete safe transfer  Ambulation/Gait Ambulation/Gait assistance: Modified independent (Device/Increase time) Gait Distance (Feet): 2 Feet Assistive device: None           Stairs             Wheelchair Mobility    Modified Rankin (Stroke Patients Only)       Balance Overall balance assessment: Mild deficits observed, not formally tested                                          Cognition Arousal/Alertness: Awake/alert Behavior During Therapy: WFL for tasks assessed/performed Overall Cognitive Status: Within Functional Limits for tasks assessed                                 General Comments: Pt did exhibit some frustration during session      Exercises General Exercises - Lower Extremity Ankle Circles/Pumps: AROM;10 reps;Both Heel Slides: AROM;10 reps;Both Other Exercises Other Exercises: Pt and PT discussed importance of continued breath with exertion Other Exercises: spO2 monitored continuously, sitting EOB >90% for 2 minutes, sit <> stand ~30sec in 90s, and transferred  to chair. No desaturation noted.    General Comments        Pertinent Vitals/Pain Pain Assessment: No/denies pain    Home Living                      Prior Function            PT Goals (current goals can now be found in the care plan section) Progress towards PT goals: Progressing toward goals    Frequency    Min 2X/week      PT Plan Current plan remains appropriate    Co-evaluation              AM-PAC PT "6 Clicks" Mobility   Outcome Measure  Help needed turning from your back to your side while in a flat bed without using bedrails?: A Little Help needed moving from lying on your back to sitting  on the side of a flat bed without using bedrails?: A Little Help needed moving to and from a bed to a chair (including a wheelchair)?: A Little Help needed standing up from a chair using your arms (e.g., wheelchair or bedside chair)?: A Little Help needed to walk in hospital room?: A Little Help needed climbing 3-5 steps with a railing? : A Little 6 Click Score: 18    End of Session Equipment Utilized During Treatment: Gait belt Activity Tolerance: Patient tolerated treatment well Patient left: in chair;with call bell/phone within reach;with family/visitor present Nurse Communication: Mobility status PT Visit Diagnosis: Unsteadiness on feet (R26.81);Other abnormalities of gait and mobility (R26.89);Muscle weakness (generalized) (M62.81)     Time: [id number] PT Time Calculation (min) (ACUTE ONLY): 15 min  Charges:  $Therapeutic Activity: 8-22 mins                     Scott [name] PT, DPT 11:45 AM,03/24/19

## 2019-03-24 NOTE — Care Management Important Message (Signed)
Important Message  Patient Details  Name: SWAN HANDZEL MRN: [id number] Date of Birth: [birth year]   Medicare Important Message Given:  Yes     Dannette Barbara 03/24/2019, 11:07 AM

## 2019-03-24 NOTE — Progress Notes (Signed)
Pulmonary Medicine          Date: 03/24/2019,   MRN# [id number] Scott [name] September 11, 1940  AdmissionWeight: 76.2 kg                 CurrentWeight: 76.2 kg  Referring physician: Dr. Loleta Books    CHIEF COMPLAINT:   Acute on chronic hypoxemic respiratory failure   SUBJECTIVE   Patient sitting up in bed with wife at bedside.    He did much better with PT today.    He asks regarding plan for d/c and I discussed with him Dr Jimmye Norman is in charge and will make this decision.     PAST MEDICAL HISTORY   Past Medical History:  Diagnosis Date  . Arthritis    right hip, since a fall  . CAD (coronary artery disease)    a. 04/2012 Cath: 3VD->Med Rx;  b. 04/2013 PCI RCA (2 DES); c. 03/2014 PCI: LAD 80 (3.0x23 Xience Alpine DES); d. 06/2014 CABG x 2 ([facility]) LIMA->LAD, VG->OM; e. 12/2014 Cath ([facility]): patent grafts->Med Rx; f. 08/2015 Cath: patent grafts; g. 11/2016 Cath: LM min irregs, LAD 20p, 54m, LCX 100p/m, RCA 20ost, 10p/m/d, VG->OM1 nl, LIMA->dLAD nl; f. 07/2017 MV: No isch, EF 51%.  . Cardiomyopathy, ischemic    a. 04/2012 Echo: EF 40-45%;  b. 08/2013 Echo: EF 45-50%; c. 01/2015 Echo: EF 40-45%; d. 07/2016 Echo: EF 60-65%; e. 09/2017 Echo: EF 55-60%, Gr1 DD.  . Carotid arterial disease ([place])    a. 05/2013 Carotid U/S; bilat 40-50% ICA stenosis.  . Chronic Chest Pain    USES NITRO  . Chronic systolic CHF (congestive heart failure) ([place])    a. 01/2015 Echo: EF 40-45%; b. 07/2016 Echo: EF 60-65%; c. 09/2017 Echo: EF 55-60%, Gr1 DD, nl RV fxn.  . Cough   . Diverticulosis   . Dizziness   . Dyspnea    WITH EXERTION  . Headache 1970's   migraine  . History of blood transfusion 2016   post op  . Hyperlipidemia   . Hypertension   . Iron deficiency anemia due to chronic blood loss 09/11/2016  . Myocardial infarction [facility])    unsure of when  . Pulmonary emboli ([place]) 07/2016   a. On Xarelto.  . Reflux esophagitis   . Sternal pain    a. 03/2016 s/p Sternal wire removal; b. 05/2016 s/p redo  median sternotomy for sternal debridement and sternal plating.  . Syncope 04/2018   CARDIOLOGIST WAS DR Rockey Situ AND HE WAS AWARE-NOW PT SEES PARASCHOS  . Tubular adenoma of colon      SURGICAL HISTORY   Past Surgical History:  Procedure Laterality Date  . CARDIAC CATHETERIZATION  05/01/2012  . CARDIAC CATHETERIZATION  04/2013   [facility];x3 stent  . CARDIAC CATHETERIZATION  01/11/15    [facility]  . CARDIAC CATHETERIZATION N/A 09/16/2015   Procedure: LEFT HEART CATH AND CORS/GRAFTS ANGIOGRAPHY;  Surgeon: Minna Merritts, MD;  Location: [place] CV LAB;  Service: Cardiovascular;  Laterality: N/A;  . CARPAL TUNNEL RELEASE     right hand  . CATARACT EXTRACTION     LEFT  . CATARACT EXTRACTION W/PHACO Left 09/16/2014   Procedure: CATARACT EXTRACTION PHACO AND INTRAOCULAR LENS PLACEMENT (IOC);  Surgeon: Leandrew Koyanagi, MD;  Location: [place];  Service: Ophthalmology;  Laterality: Left;  . COLONOSCOPY    . COLONOSCOPY WITH PROPOFOL N/A 01/28/2016   Procedure: COLONOSCOPY WITH PROPOFOL;  Surgeon: Lollie Sails, MD;  Location: [facility] ENDOSCOPY;  Service:  Endoscopy;  Laterality: N/A;  . CORONARY ANGIOPLASTY WITH STENT PLACEMENT  04/13/2014  . CORONARY ARTERY BYPASS GRAFT  06-26-14   x3 bypasses  . CORONARY PRESSURE WIRE/FFR WITH 3D MAPPING N/A 12/10/2018   Procedure: Suzette Battiest PRESSURE WIRE/FFR STUDY;  Surgeon: Isaias Cowman, MD;  Location: [place] CV LAB;  Service: Cardiovascular;  Laterality: N/A;  . CYSTOSCOPY N/A 08/14/2018   Procedure: CYSTOSCOPY;  Surgeon: Abbie Sons, MD;  Location: [facility] ORS;  Service: Urology;  Laterality: N/A;  . DE QUERVAIN'S RELEASE Left 08/22/2012  . ESOPHAGOGASTRODUODENOSCOPY (EGD) WITH PROPOFOL N/A 04/26/2015   Procedure: ESOPHAGOGASTRODUODENOSCOPY (EGD) WITH PROPOFOL;  Surgeon: Hulen Luster, MD;  Location: [facility] ENDOSCOPY;  Service: Gastroenterology;  Laterality: N/A;  . ESOPHAGOGASTRODUODENOSCOPY (EGD) WITH PROPOFOL N/A 05/29/2017    Procedure: ESOPHAGOGASTRODUODENOSCOPY (EGD) WITH PROPOFOL;  Surgeon: Lollie Sails, MD;  Location: [facility] ENDOSCOPY;  Service: Endoscopy;  Laterality: N/A;  . INTRAVASCULAR ULTRASOUND/IVUS N/A 12/10/2018   Procedure: Intravascular Ultrasound/IVUS;  Surgeon: Isaias Cowman, MD;  Location: [place] [facility] LAB;  Service: Cardiovascular;  Laterality: N/A;  . LEFT HEART CATH AND CORONARY ANGIOGRAPHY N/A 12/04/2016   Procedure: LEFT HEART CATH AND CORS/GRAFTS  ANGIOGRAPHY;  Surgeon: Wellington Hampshire, MD;  Location: [place] CV LAB;  Service: Cardiovascular;  Laterality: N/A;  . LEFT HEART CATH AND CORS/GRAFTS ANGIOGRAPHY N/A 04/09/2018   Procedure: LEFT HEART CATH AND CORS/GRAFTS ANGIOGRAPHY;  Surgeon: Isaias Cowman, MD;  Location: [place] [facility];  Service: Cardiovascular;  Laterality: N/A;  . LEFT HEART CATH AND CORS/GRAFTS ANGIOGRAPHY N/A 12/10/2018   Procedure: LEFT HEART CATH AND CORS/GRAFTS ANGIOGRAPHY;  Surgeon: Isaias Cowman, MD;  Location: [place] [facility] LAB;  Service: Cardiovascular;  Laterality: N/A;  . RIB PLATING N/A 05/25/2016   Procedure: STERNAL PLATING;  Surgeon: Melrose Nakayama, MD;  Location: [place];  Service: Thoracic;  Laterality: N/A;  . right shoulder    . STERNAL WIRES REMOVAL N/A 03/30/2016   Procedure: STERNAL WIRES REMOVAL;  Surgeon: Melrose Nakayama, MD;  Location: [place];  Service: Thoracic;  Laterality: N/A;  . TONSILLECTOMY       FAMILY HISTORY   Family History  Problem Relation Age of Onset  . Heart attack Father 89       MI  . Cancer Maternal Uncle      SOCIAL HISTORY   Social History   Tobacco Use  . Smoking status: Former Smoker    Packs/day: 1.00    Years: 35.00    Pack years: 35.00    Types: Cigarettes    Start date: 05/02/2009  . Smokeless tobacco: Former Network engineer Use Topics  . Alcohol use: No  . Drug use: No     MEDICATIONS    Home Medication:    Current Medication:  Current  Facility-Administered Medications:  .  0.9 %  sodium chloride infusion, , Intravenous, PRN, Ivor Costa, MD, Stopped at 03/23/19 1320 .  acetaminophen (TYLENOL) tablet 650 mg, 650 mg, Oral, Q6H PRN, Ivor Costa, MD, 650 mg at 03/22/19 1052 .  albuterol (PROVENTIL) (2.5 MG/3ML) 0.083% nebulizer solution 2.5 mg, 2.5 mg, Nebulization, Q4H PRN, Danford, Suann Larry, MD, 2.5 mg at 03/23/19 0553 .  ascorbic acid (VITAMIN C) tablet 500 mg, 500 mg, Oral, Daily, Ivor Costa, MD, 500 mg at 03/24/19 0929 .  atorvastatin (LIPITOR) tablet 80 mg, 80 mg, Oral, q1800, Ivor Costa, MD, 80 mg at 03/23/19 1837 .  colchicine tablet 0.6 mg, 0.6 mg, Oral, Daily, Refujio Haymer, MD, 0.6 mg  at 03/24/19 0930 .  dextromethorphan-guaiFENesin (MUCINEX DM) 30-600 MG per 12 hr tablet 1 tablet, 1 tablet, Oral, BID, Ivor Costa, MD, 1 tablet at 03/24/19 0929 .  ezetimibe (ZETIA) tablet 10 mg, 10 mg, Oral, Daily, Ivor Costa, MD, 10 mg at 03/24/19 0930 .  feeding supplement (ENSURE ENLIVE) (ENSURE ENLIVE) liquid 237 mL, 237 mL, Oral, BID BM, Danford, Suann Larry, MD, 237 mL at 03/23/19 1400 .  ipratropium-albuterol (DUONEB) 0.5-2.5 (3) MG/3ML nebulizer solution 3 mL, 3 mL, Nebulization, TID, Danford, Suann Larry, MD, 3 mL at 03/24/19 0738 .  mometasone-formoterol (DULERA) 100-5 MCG/ACT inhaler 2 puff, 2 puff, Inhalation, BID, Ottie Glazier, MD, 2 puff at 03/24/19 0930 .  nitroGLYCERIN (NITROSTAT) SL tablet 0.4 mg, 0.4 mg, Sublingual, Q5 min PRN, Ivor Costa, MD .  ondansetron [facility]) injection 4 mg, 4 mg, Intravenous, Q8H PRN, Ivor Costa, MD .  predniSONE (DELTASONE) tablet 20 mg, 20 mg, Oral, Q breakfast, Ottie Glazier, MD, 20 mg at 03/23/19 1837 .  sodium chloride flush (NS) 0.9 % injection 3 mL, 3 mL, Intravenous, Once, Ivor Costa, MD .  sulfamethoxazole-trimethoprim (BACTRIM) 400-80 MG per tablet 1 tablet, 1 tablet, Oral, Q12H, Ottie Glazier, MD, 1 tablet at 03/24/19 0930 .  zinc sulfate capsule 220 mg, 220 mg, Oral,  Daily, Ivor Costa, MD, 220 mg at 03/24/19 0930    ALLERGIES   Contrast media [iodinated diagnostic agents], Metrizamide, Oxycodone hcl, and Vicodin [hydrocodone-acetaminophen]     REVIEW OF SYSTEMS    Review of Systems:  Gen:  Denies  fever, sweats, chills weigh loss  HEENT: Denies blurred vision, double vision, ear pain, eye pain, hearing loss, nose bleeds, sore throat Cardiac:  No dizziness, chest pain or heaviness, chest tightness,edema Resp:   Denies cough or sputum porduction, shortness of breath,wheezing, hemoptysis,  Gi: Denies swallowing difficulty, stomach pain, nausea or vomiting, diarrhea, constipation, bowel incontinence Gu:  Denies bladder incontinence, burning urine Ext:   Denies Joint pain, stiffness or swelling Skin: Denies  skin rash, easy bruising or bleeding or hives Endoc:  Denies polyuria, polydipsia , polyphagia or weight change Psych:   Denies depression, insomnia or hallucinations   Other:  All other systems negative   VS: BP 128/69 (BP Location: Right Arm)   Pulse 77   Temp 98.1 F (36.7 C) (Oral)   Resp (!) 22   Ht 5\' 10"  (1.778 m)   Wt 76.2 kg   SpO2 91%   BMI 24.11 kg/m      PHYSICAL EXAM    GENERAL:NAD, no fevers, chills, no weakness no fatigue HEAD: Normocephalic, atraumatic.  EYES: Pupils equal, round, reactive to light. Extraocular muscles intact. No scleral icterus.  MOUTH: Moist mucosal membrane. Dentition intact. No abscess noted.  EAR, NOSE, THROAT: Clear without exudates. No external lesions.  NECK: Supple. No thyromegaly. No nodules. No JVD.  PULMONARY: Decreased breath sounds bilaterally without adventitious lung sounds CARDIOVASCULAR: S1 and S2. Regular rate and rhythm. No murmurs, rubs, or gallops. No edema. Pedal pulses 2+ bilaterally.  GASTROINTESTINAL: Soft, nontender, nondistended. No masses. Positive bowel sounds. No hepatosplenomegaly.  MUSCULOSKELETAL: No swelling, clubbing, or edema. Range of motion full in all  extremities.  NEUROLOGIC: Cranial nerves II through XII are intact. No gross focal neurological deficits. Sensation intact. Reflexes intact.  SKIN: No ulceration, lesions, rashes, or cyanosis. Skin warm and dry. Turgor intact.  PSYCHIATRIC: Mood, affect within normal limits. The patient is awake, alert and oriented x 3. Insight, judgment intact.       IMAGING  MR ANGIO NECK W WO CONTRAST  Result Date: 02/24/2019 CLINICAL DATA:  Dizziness over the last 3-5 weeks. Balance disturbance. Occluded carotid artery. EXAM: MRA NECK WITHOUT AND WITH CONTRAST TECHNIQUE: Multiplanar and multiecho pulse sequences of the neck were obtained without and with intravenous contrast. Angiographic images of the neck were obtained using MRA technique without and with intravenous contrast. CONTRAST:  76mL GADAVIST GADOBUTROL 1 MMOL/ML IV SOLN COMPARISON:  02/17/2019.  02/18/2019 FINDINGS: Branching pattern of the arch is normal. No origin stenoses. Both common carotid arteries are widely patent to their respective bifurcation. On the right, there is mild atherosclerotic irregularity of the ICA bulb but no stenosis. Cervical ICA widely patent beyond that. On the left, there is mild atherosclerotic irregularity in the ICA bulb but no stenosis. Cervical ICA widely patent beyond that. Both vertebral artery origins are patent. The left vertebral artery is dominant. Both vertebral arteries appear normal through the cervical region to the foramen magnum. The examination also includes the majority of the intracranial circulation. No anterior circulation stenosis or occlusion is seen. No basilar stenosis. Posterior circulation branch vessels show flow. Right PCA takes fetal origin the anterior circulation. IMPRESSION: Mild atherosclerotic irregularity of both ICA bulb regions but no carotid stenosis. No evidence of occlusion or significant focal vascular lesion. These results will be called to the ordering clinician or representative  by the Radiologist Assistant, and communication documented in the PACS or zVision Dashboard. Electronically Signed   By: Nelson Chimes M.D.   On: 02/24/2019 12:30   DG Chest Port 1 View  Result Date: 03/20/2019 CLINICAL DATA:  Chest pain and shortness of breath. EXAM: PORTABLE CHEST 1 VIEW COMPARISON:  01/05/2019 FINDINGS: 11:19 a.m. Low volume film. Patchy peripheral airspace opacity noted bilaterally, right greater than left. Cardiopericardial silhouette is at upper limits of normal for size. No substantial pleural effusion. Sternal fusion hardware evident. Telemetry leads overlie the chest. IMPRESSION: Right greater than left patchy bilateral ground-glass airspace disease, peripherally predominant. Imaging features suggest multifocal pneumonia and atypical etiology would be a consideration. Electronically Signed   By: Misty Stanley M.D.   On: 03/20/2019 11:34   ECHOCARDIOGRAM COMPLETE  Result Date: 03/22/2019    ECHOCARDIOGRAM REPORT   Patient Name:   Scott [name] Date of Exam: 03/21/2019 Medical Rec #:  [id number]      Height:       70.0 in Accession #:    [id number]     Weight:       168.0 lb Date of Birth:  05-30-1940       BSA:          1.938 m Patient Age:    8 years       BP:           113/59 mmHg Patient Gender: M              HR:           81 bpm. Exam Location:  [facility] Procedure: 2D Echo Indications:     Mitral Valve Insufficiency 424.0/134.0  History:         Patient has prior history of Echocardiogram examinations, most                  recent 10/02/2017. Prior CABG, Signs/Symptoms:Shortness of                  Breath and Chest Pain; Risk Factors:Dyslipidemia.  Sonographer:     Avanell Shackleton Referring Phys:  [id number]  Dorthey Depace Diagnosing Phys: Isaias Cowman MD  Sonographer Comments: Technically difficult study due to poor echo windows. IMPRESSIONS  1. Left ventricular ejection fraction, by estimation, is 40 to 45%. The left ventricle has mildly decreased function. The left ventricle has  no regional wall motion abnormalities. Left ventricular diastolic parameters are consistent with Grade I diastolic dysfunction (impaired relaxation).  2. Right ventricular systolic function is normal. The right ventricular size is normal.  3. The mitral valve is normal in structure and function. Mild mitral valve regurgitation. No evidence of mitral stenosis.  4. The aortic valve is normal in structure and function. Aortic valve regurgitation is not visualized. No aortic stenosis is present.  5. The inferior vena cava is normal in size with greater than 50% respiratory variability, suggesting right atrial pressure of 3 mmHg. FINDINGS  Left Ventricle: Left ventricular ejection fraction, by estimation, is 40 to 45%. The left ventricle has mildly decreased function. The left ventricle has no regional wall motion abnormalities. The left ventricular internal cavity size was normal in size. There is no left ventricular hypertrophy. Left ventricular diastolic parameters are consistent with Grade I diastolic dysfunction (impaired relaxation). Right Ventricle: The right ventricular size is normal. No increase in right ventricular wall thickness. Right ventricular systolic function is normal. Left Atrium: Left atrial size was normal in size. Right Atrium: Right atrial size was normal in size. Pericardium: There is no evidence of pericardial effusion. Mitral Valve: The mitral valve is normal in structure and function. Normal mobility of the mitral valve leaflets. Mild mitral valve regurgitation. No evidence of mitral valve stenosis. Tricuspid Valve: The tricuspid valve is normal in structure. Tricuspid valve regurgitation is mild . No evidence of tricuspid stenosis. Aortic Valve: The aortic valve is normal in structure and function. Aortic valve regurgitation is not visualized. No aortic stenosis is present. Pulmonic Valve: The pulmonic valve was normal in structure. Pulmonic valve regurgitation is not visualized. No evidence  of pulmonic stenosis. Aorta: The aortic root is normal in size and structure. Venous: The inferior vena cava is normal in size with greater than 50% respiratory variability, suggesting right atrial pressure of 3 mmHg. IAS/Shunts: No atrial level shunt detected by color flow Doppler.  LEFT VENTRICLE PLAX 2D LVIDd:         5.33 cm      Diastology LVIDs:         4.54 cm      LV e' lateral:   11.10 cm/s LV PW:         0.90 cm      LV E/e' lateral: 4.8 LV IVS:        1.01 cm      LV e' medial:    6.96 cm/s LVOT diam:     1.80 cm      LV E/e' medial:  7.7 LVOT Area:     2.54 cm  LV Volumes (MOD) LV vol d, MOD A2C: 148.0 ml LV vol d, MOD A4C: 141.0 ml LV vol s, MOD A2C: 88.0 ml LV vol s, MOD A4C: 85.7 ml LV SV MOD A2C:     60.0 ml LV SV MOD A4C:     141.0 ml LV SV MOD BP:      59.4 ml RIGHT VENTRICLE            IVC RV S prime:     8.27 cm/s  IVC diam: 1.98 cm LEFT ATRIUM  Index LA diam:        4.70 cm 2.42 cm/m LA Vol (A2C):   42.7 ml 22.03 ml/m LA Vol (A4C):   46.0 ml 23.73 ml/m LA Biplane Vol: 45.4 ml 23.42 ml/m   AORTA Ao Root diam: 3.30 cm MITRAL VALVE MV Area (PHT): 3.21 cm    SHUNTS MV Decel Time: 236 msec    Systemic Diam: 1.80 cm MV E velocity: 53.80 cm/s MV A velocity: 84.90 cm/s MV E/A ratio:  0.63 Isaias Cowman MD Electronically signed by Isaias Cowman MD Signature Date/Time: 03/22/2019/12:37:17 PM    Final              ASSESSMENT/PLAN   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  -will do lasix 20 IV x today to help with O2 requirement. -will d/c zinc and vitaminC  to reduce nephrotoxicity while diuresing.    Non-massive hemoptysis -Patient's had recurrent low volume hemoptysis in the past and recently last month which has self resolved -plan to resume Eliquis after some time off to help resolution of hemoptysis   History of pulmonary embolism -Patient had left upper lobe segmental PE in 2018 which tracked all the way up to left main PA, repeat CT chest showed resolution of clot burden. 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   Patient/Family are satisfied with care plan and all questions have been answered.  This document was prepared using Dragon voice recognition software and may include unintentional dictation errors.     Ottie Glazier, M.D.  Division of [place]

## 2019-03-25 ENCOUNTER — Inpatient Hospital Stay: Payer: Medicare HMO

## 2019-03-25 LAB — CBC
HCT: 41.6 % (ref 39.0–52.0)
Hemoglobin: 13.9 g/dL (ref 13.0–17.0)
MCH: 31.7 pg (ref 26.0–34.0)
MCHC: 33.4 g/dL (ref 30.0–36.0)
MCV: 94.8 fL (ref 80.0–100.0)
Platelets: 321 10*3/uL (ref 150–400)
RBC: 4.39 MIL/uL (ref 4.22–5.81)
RDW: 14.2 % (ref 11.5–15.5)
WBC: 9.2 10*3/uL (ref 4.0–10.5)
nRBC: 0 % (ref 0.0–0.2)

## 2019-03-25 LAB — BASIC METABOLIC PANEL
Anion gap: 8 (ref 5–15)
BUN: 24 mg/dL — ABNORMAL HIGH (ref 8–23)
CO2: 28 mmol/L (ref 22–32)
Calcium: 8.7 mg/dL — ABNORMAL LOW (ref 8.9–10.3)
Chloride: 103 mmol/L (ref 98–111)
Creatinine, Ser: 0.97 mg/dL (ref 0.61–1.24)
GFR calc Af Amer: >60 mL/min (ref >60)
GFR calc non Af Amer: >60 mL/min (ref >60)
Glucose, Bld: 131 mg/dL — ABNORMAL HIGH (ref 70–99)
Potassium: 4.7 mmol/L (ref 3.5–5.1)
Sodium: 139 mmol/L (ref 135–145)

## 2019-03-25 LAB — CULTURE, BLOOD (ROUTINE X 2)
Culture: NO GROWTH
Culture: NO GROWTH
Special Requests: ADEQUATE
Special Requests: ADEQUATE

## 2019-03-25 LAB — FUNGITELL, SERUM: Fungitell Result: <31 pg/mL (ref <80)

## 2019-03-25 MED ORDER — MOMETASONE FURO-FORMOTEROL FUM 100-5 MCG/ACT IN AERO: 2.0000 | INHALATION_SPRAY | Freq: Two times a day (BID) | RESPIRATORY_TRACT | 0 refills | Status: DC

## 2019-03-25 MED ORDER — ALBUTEROL SULFATE HFA 108 (90 BASE) MCG/ACT IN AERS: 2.0000 | INHALATION_SPRAY | Freq: Four times a day (QID) | RESPIRATORY_TRACT | 0 refills | Status: DC | PRN

## 2019-03-25 MED ORDER — PREDNISONE 20 MG PO TABS: 20.0000 mg | ORAL_TABLET | Freq: Every day | ORAL | 0 refills | Status: AC

## 2019-03-25 NOTE — Discharge Summary (Signed)
Physician Discharge Summary  RIOT SIPP [id number] DOB: 1940/10/20 DOA: 03/20/2019  PCP: Baxter Hire, MD  Admit date: 03/20/2019 Discharge date: 03/25/2019  Admitted From: home Disposition: home w/ home health  Recommendations for Outpatient Follow-up:  1. Follow up with PCP in 1-2 weeks 2. F/u pulmon (Dr. [name]) in 1 week 3.  Home Health: yes Equipment/Devices: oxygen  Discharge Condition: stable CODE STATUS: full  Diet recommendation: Heart Healthy  Brief/Interim Summary: HPI was taken from Dr. Blaine Hamper: Scott [name] is a 79 y.o. male with medical history significant of hypertension, hyperlipidemia, PE on Xarelto, CAD, CABG, stent placement, dCHF, PAF, GERD, who presents with shortness breath.  Patient states that he has been having shortness of breath intermittently for about 1 week.  He has low grade fever and chills.  He has productive cough.  He also reports some intermittent moderate chest pain on coughing.  He states that occasionally he has little nosebleeding on heavy coughing.  Patient has 1 time of loose stool bowel movement today. He has nausea, but no vomiting or abdominal pain.  No symptoms of UTI or unilateral weakness. Of note, patient is on 3 L nasal cannula oxygen at home, he had oxygen desaturation to 73% on home 3 L oxygen at one time.  Currently oxygen saturation 95-97% on 3 L nasal cannula oxygen.  ED Course: pt was found to have Covid Ag test negative, pending PCR for Covid, troponin 14, WBC 7.5, creatinine 1.21, BUN 18, GFR >60, temperature 99.7, blood pressure 130/67, heart rate 77, tachypnea, oxygen saturation 95% on 3 L nasal cannula oxygen.  Chest x-ray showed bilateral multifocal infiltration.  Patient is admitted to telemetry bed as inpatient.  Hospital Course from Dr. Lenise Herald 03/24/19-03/25/19: Pt was found to have acute on chronic hypoxic respiratory failure secondary of unknown etiology, possibly to COVID fibrosis vs atypical/fungal pneumonia  vs pulmonary hemorrhage. IgG was positive for COVID19. All anticoagulation was held inpatient and pt will f/u outpatient w/ his cardiologist to determine if he will be restarted back on it. Pt verbalized his understanding. Pt was initially started on IV remdesivir and then it was stopped after COVID19 PCR was neg. Pt was also treated w/ IV abxs, IV steroids, bronchodilators as well as supplemental oxygen. Pt will f/u outpatient w/ pulmon for further evaluation and treatment. Also, PT saw the pt and initially recommended SNF but repeat evaluation PT recommended home health. Home health was set up by CM prior to d/c. For more information please see previous progress notes   Discharge Diagnoses:  Principal Problem:   Multifocal pneumonia Active Problems:   Essential hypertension   PAF (paroxysmal atrial fibrillation) (HCC)   Chronic diastolic heart failure (HCC)   Hyperlipemia   Pulmonary embolus (HCC)   CAD (coronary artery disease)  Acute on chronic hypoxic respiratory failure: initially thought to be CAP or COVID, started on empiric antibiotics, remdesivir and steroids. COVID PCR subsequently negative. Remdesivir stopped. Strep pneumo and legionella antigens negative. DDx: pulmonary hemorrhage, COVID fibrosis, atypical/fungal pneumonia. IgG is positive for COVID. Holdall anticoagulation,cardiology prefer to revisit this after an interval, in the office. Continue on bactrim, prednisone, & colchicine as per pulmon. Bronchoscopy in chronic stable state on an outpatient basis as per pulmon. Pulmon following and recs apprec  PAF: continue holdmetoprolol due to hypotension. 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 Continue to holdeliquis  Chronic  systolic CHF: continue on IV lasix. Echo shows EF back to 40-45%. Strict I/Os,  daily weights  Generalized weakness: PT initially recs SNF but now recs home health  Discharge Instructions\  ####HOLD ELIQUIS UNTIL YOU SEE YOUR CARDIOLOGIST####  Discharge Instructions    Diet - low sodium heart healthy   Complete by: As directed    Discharge instructions   Complete by: As directed    F/u PCP in 1-2 weeks; F/u pulmon in 1 week   Increase activity slowly   Complete by: As directed      Allergies as of 03/25/2019      Reactions   Contrast Media [iodinated Diagnostic Agents] Hives   Metrizamide Hives   Oxycodone Hcl Itching   Vicodin [hydrocodone-acetaminophen] Itching      Medication List    TAKE these medications   albuterol 108 (90 Base) MCG/ACT inhaler Commonly known as: VENTOLIN HFA Inhale 2 puffs into the lungs every 6 (six) hours as needed for wheezing or shortness of breath.   atorvastatin 80 MG tablet Commonly known as: LIPITOR Take 1 tablet (80 mg total) by mouth daily at 6 PM.   Eliquis 2.5 MG Tabs tablet Generic drug: apixaban Take 2.5 mg by mouth 2 (two) times daily.   ezetimibe 10 MG tablet Commonly known as: ZETIA Take 1 tablet (10 mg total) by mouth daily.   ipratropium 0.06 % nasal spray Commonly known as: ATROVENT Place 2 sprays into both nostrils 3 (three) times daily as needed for rhinitis or allergies.   isosorbide mononitrate 60 MG 24 hr tablet Commonly known as: IMDUR Take 60 mg by mouth 2 (two) times daily.   metoprolol succinate 25 MG 24 hr tablet Commonly known as: TOPROL-XL Take 25 mg by mouth every evening.   mometasone-formoterol 100-5 MCG/ACT Aero Commonly known as: DULERA Inhale 2 puffs into the lungs 2 (two) times daily.   nitroGLYCERIN 0.4 MG SL tablet Commonly known as: NITROSTAT Place 1 tablet (0.4 mg total) under the tongue every 5 (five) minutes as needed for chest pain.   predniSONE 20 MG tablet Commonly known as: DELTASONE Take 1 tablet (20 mg total) by mouth daily with breakfast for 14 days.    ranolazine 1000 MG SR tablet Commonly known as: RANEXA Take 1 tablet (1,000 mg total) by mouth 2 (two) times daily.   sulfamethoxazole-trimethoprim 400-80 MG tablet Commonly known as: BACTRIM Take 1 tablet by mouth 2 (two) times daily.      Follow-up Information    Ottie Glazier, MD Follow up in 1 week(s).   Specialty: Pulmonary Disease Contact information: [place] [address] [phone]          Allergies  Allergen Reactions  . Contrast Media [Iodinated Diagnostic Agents] Hives  . Metrizamide Hives  . Oxycodone Hcl Itching  . Vicodin [Hydrocodone-Acetaminophen] Itching    Consultations:  PULMON  CARDIO   Procedures/Studies: MR ANGIO NECK W WO CONTRAST  Result Date: 02/24/2019 CLINICAL DATA:  Dizziness over the last 3-5 weeks. Balance disturbance. Occluded carotid artery. EXAM: MRA NECK WITHOUT AND WITH CONTRAST TECHNIQUE: Multiplanar and multiecho pulse sequences of the neck were obtained without and with intravenous contrast. Angiographic images of the neck were obtained using MRA technique without and with intravenous contrast. CONTRAST:  13mL GADAVIST GADOBUTROL 1 MMOL/ML IV SOLN COMPARISON:  02/17/2019.  02/18/2019 FINDINGS: Branching pattern of the arch is normal. No origin stenoses. Both common carotid arteries are widely patent to their respective bifurcation. On the right, there  is mild atherosclerotic irregularity of the ICA bulb but no stenosis. Cervical ICA widely patent beyond that. On the left, there is mild atherosclerotic irregularity in the ICA bulb but no stenosis. Cervical ICA widely patent beyond that. Both vertebral artery origins are patent. The left vertebral artery is dominant. Both vertebral arteries appear normal through the cervical region to the foramen magnum. The examination also includes the majority of the intracranial circulation. No anterior circulation stenosis or occlusion is seen. No basilar stenosis. Posterior  circulation branch vessels show flow. Right PCA takes fetal origin the anterior circulation. IMPRESSION: Mild atherosclerotic irregularity of both ICA bulb regions but no carotid stenosis. No evidence of occlusion or significant focal vascular lesion. These results will be called to the ordering clinician or representative by the Radiologist Assistant, and communication documented in the PACS or zVision Dashboard. Electronically Signed   By: Nelson Chimes M.D.   On: 02/24/2019 12:30   DG Chest Port 1 View  Result Date: 03/25/2019 CLINICAL DATA:  Chest pain and shortness of breath. The patient is COVID-19 positive. EXAM: PORTABLE CHEST 1 VIEW COMPARISON:  PA and lateral chest 01/05/2019. Single-view of the chest 03/20/2019. CT chest 02/18/2019. FINDINGS: Extensive bilateral airspace disease persists and appears slightly improved compared to the most recent exam. No pneumothorax or pleural effusion. Heart size is normal. The patient is status post CABG. IMPRESSION: Bilateral airspace disease is mildly improved since the most recent exam. No new abnormality. Electronically Signed   By: Inge Rise M.D.   On: 03/25/2019 14:35   DG Chest Port 1 View  Result Date: 03/20/2019 CLINICAL DATA:  Chest pain and shortness of breath. EXAM: PORTABLE CHEST 1 VIEW COMPARISON:  01/05/2019 FINDINGS: 11:19 a.m. Low volume film. Patchy peripheral airspace opacity noted bilaterally, right greater than left. Cardiopericardial silhouette is at upper limits of normal for size. No substantial pleural effusion. Sternal fusion hardware evident. Telemetry leads overlie the chest. IMPRESSION: Right greater than left patchy bilateral ground-glass airspace disease, peripherally predominant. Imaging features suggest multifocal pneumonia and atypical etiology would be a consideration. Electronically Signed   By: Misty Stanley M.D.   On: 03/20/2019 11:34   ECHOCARDIOGRAM COMPLETE  Result Date: 03/22/2019    ECHOCARDIOGRAM REPORT    Patient Name:   Scott [name] Date of Exam: 03/21/2019 Medical Rec #:  [id number]      Height:       70.0 in Accession #:    [id number]     Weight:       168.0 lb Date of Birth:  1940/03/24       BSA:          1.938 m Patient Age:    79 years       BP:           113/59 mmHg Patient Gender: M              HR:           81 bpm. Exam Location:  [facility] Procedure: 2D Echo Indications:     Mitral Valve Insufficiency 424.0/134.0  History:         Patient has prior history of Echocardiogram examinations, most                  recent 10/02/2017. Prior CABG, Signs/Symptoms:Shortness of                  Breath and Chest Pain; Risk Factors:Dyslipidemia.  Sonographer:     Avanell Shackleton  Referring Phys:  [id number] Ottie Glazier Diagnosing Phys: Isaias Cowman MD  Sonographer Comments: Technically difficult study due to poor echo windows. IMPRESSIONS  1. Left ventricular ejection fraction, by estimation, is 40 to 45%. The left ventricle has mildly decreased function. The left ventricle has no regional wall motion abnormalities. Left ventricular diastolic parameters are consistent with Grade I diastolic dysfunction (impaired relaxation).  2. Right ventricular systolic function is normal. The right ventricular size is normal.  3. The mitral valve is normal in structure and function. Mild mitral valve regurgitation. No evidence of mitral stenosis.  4. The aortic valve is normal in structure and function. Aortic valve regurgitation is not visualized. No aortic stenosis is present.  5. The inferior vena cava is normal in size with greater than 50% respiratory variability, suggesting right atrial pressure of 3 mmHg. FINDINGS  Left Ventricle: Left ventricular ejection fraction, by estimation, is 40 to 45%. The left ventricle has mildly decreased function. The left ventricle has no regional wall motion abnormalities. The left ventricular internal cavity size was normal in size. There is no left ventricular hypertrophy. Left ventricular  diastolic parameters are consistent with Grade I diastolic dysfunction (impaired relaxation). Right Ventricle: The right ventricular size is normal. No increase in right ventricular wall thickness. Right ventricular systolic function is normal. Left Atrium: Left atrial size was normal in size. Right Atrium: Right atrial size was normal in size. Pericardium: There is no evidence of pericardial effusion. Mitral Valve: The mitral valve is normal in structure and function. Normal mobility of the mitral valve leaflets. Mild mitral valve regurgitation. No evidence of mitral valve stenosis. Tricuspid Valve: The tricuspid valve is normal in structure. Tricuspid valve regurgitation is mild . No evidence of tricuspid stenosis. Aortic Valve: The aortic valve is normal in structure and function. Aortic valve regurgitation is not visualized. No aortic stenosis is present. Pulmonic Valve: The pulmonic valve was normal in structure. Pulmonic valve regurgitation is not visualized. No evidence of pulmonic stenosis. Aorta: The aortic root is normal in size and structure. Venous: The inferior vena cava is normal in size with greater than 50% respiratory variability, suggesting right atrial pressure of 3 mmHg. IAS/Shunts: No atrial level shunt detected by color flow Doppler.  LEFT VENTRICLE PLAX 2D LVIDd:         5.33 cm      Diastology LVIDs:         4.54 cm      LV e' lateral:   11.10 cm/s LV PW:         0.90 cm      LV E/e' lateral: 4.8 LV IVS:        1.01 cm      LV e' medial:    6.96 cm/s LVOT diam:     1.80 cm      LV E/e' medial:  7.7 LVOT Area:     2.54 cm  LV Volumes (MOD) LV vol d, MOD A2C: 148.0 ml LV vol d, MOD A4C: 141.0 ml LV vol s, MOD A2C: 88.0 ml LV vol s, MOD A4C: 85.7 ml LV SV MOD A2C:     60.0 ml LV SV MOD A4C:     141.0 ml LV SV MOD BP:      59.4 ml RIGHT VENTRICLE            IVC RV S prime:     8.27 cm/s  IVC diam: 1.98 cm LEFT ATRIUM  Index LA diam:        4.70 cm 2.42 cm/m LA Vol (A2C):   42.7 ml  22.03 ml/m LA Vol (A4C):   46.0 ml 23.73 ml/m LA Biplane Vol: 45.4 ml 23.42 ml/m   AORTA Ao Root diam: 3.30 cm MITRAL VALVE MV Area (PHT): 3.21 cm    SHUNTS MV Decel Time: 236 msec    Systemic Diam: 1.80 cm MV E velocity: 53.80 cm/s MV A velocity: 84.90 cm/s MV E/A ratio:  0.63 Isaias Cowman MD Electronically signed by Isaias Cowman MD Signature Date/Time: 03/22/2019/12:37:17 PM    Final       Subjective: Pt c/o shortness of breath especially w/ exertion   Discharge Exam: Vitals:   03/25/19 1130 03/25/19 1411  BP: 107/82 121/77  Pulse: 72 88  Resp:  16  Temp:  97.9 F (36.6 C)  SpO2:  97%   Vitals:   03/25/19 0413 03/25/19 0754 03/25/19 1130 03/25/19 1411  BP: 137/81  107/82 121/77  Pulse: 65  72 88  Resp: 20   16  Temp: 97.8 F (36.6 C)   97.9 F (36.6 C)  TempSrc: Oral   Oral  SpO2: 92% 91%  97%  Weight:      Height:       General exam: Appears calm and comfortable  Respiratory system: decreased breath sounds b/l Cardiovascular system: S1 & S2 +. No rubs, gallops or clicks.  Gastrointestinal system: Abdomen is nondistended, soft and nontender.  Hypoactive bowel sounds heard. Central nervous system: Alert and oriented. Moves all 4 extremities Psychiatry: Judgement and insight appear normal. Flat mood and affect     The results of significant diagnostics from this hospitalization (including imaging, microbiology, ancillary and laboratory) are listed below for reference.     Microbiology: Recent Results (from the past 240 hour(s))  SARS CORONAVIRUS 2 (TAT 6-24 HRS) Nasopharyngeal Nasopharyngeal Swab     Status: None   Collection Time: 03/20/19  1:21 PM   Specimen: Nasopharyngeal Swab  Result Value Ref Range Status   SARS Coronavirus 2 NEGATIVE NEGATIVE Final    Comment: (NOTE) SARS-CoV-2 target nucleic acids are NOT DETECTED. The SARS-CoV-2 RNA is generally detectable in upper and lower respiratory specimens during the acute phase of infection.  Negative results do not preclude SARS-CoV-2 infection, do not rule out co-infections with other pathogens, and should not be used as the sole basis for treatment or other patient management decisions. Negative results must be combined with clinical observations, patient history, and epidemiological information. The expected result is Negative. Fact Sheet for Patients: SugarRoll.be Fact Sheet for Healthcare Providers: https://www.woods-mathews.com/ This test is not yet approved or cleared by the [place] FDA and  has been authorized for detection and/or diagnosis of SARS-CoV-2 by FDA under an Emergency Use Authorization (EUA). This EUA will remain  in effect (meaning this test can be used) for the duration of the COVID-19 declaration under Section 56 4(b)(1) of the Act, 21 U.S.C. section 360bbb-3(b)(1), unless the authorization is terminated or revoked sooner. Performed at [facility], [place] [address], [place], [place] [address]   CULTURE, BLOOD (ROUTINE X 2) w Reflex to ID Panel     Status: None   Collection Time: 03/20/19  5:48 PM   Specimen: BLOOD  Result Value Ref Range Status   Specimen Description BLOOD BLOOD RIGHT HAND  Final   Special Requests   Final    BOTTLES DRAWN AEROBIC AND ANAEROBIC Blood Culture adequate volume   Culture  Final    NO GROWTH 5 DAYS Performed at [facility], [address] [place], [place] [address]    Report Status 03/25/2019 FINAL  Final  CULTURE, BLOOD (ROUTINE X 2) w Reflex to ID Panel     Status: None   Collection Time: 03/20/19  5:48 PM   Specimen: BLOOD  Result Value Ref Range Status   Specimen Description BLOOD RIGHT ANTECUBITAL  Final   Special Requests   Final    BOTTLES DRAWN AEROBIC AND ANAEROBIC Blood Culture adequate volume   Culture   Final    NO GROWTH 5 DAYS Performed at [facility], [address] [place], [place] [address]    Report Status 03/25/2019  FINAL  Final  Culture, sputum-assessment     Status: None   Collection Time: 03/20/19  9:45 PM   Specimen: Expectorated Sputum  Result Value Ref Range Status   Specimen Description EXPECTORATED SPUTUM  Final   Special Requests NONE  Final   Sputum evaluation   Final    THIS SPECIMEN IS ACCEPTABLE FOR SPUTUM CULTURE Performed at [facility], [address], [place], [place] [address]    Report Status 03/20/2019 FINAL  Final  Culture, respiratory     Status: None   Collection Time: 03/20/19  9:45 PM  Result Value Ref Range Status   Specimen Description   Final    EXPECTORATED SPUTUM Performed at [facility], [address], [place], [place] [address]    Special Requests   Final    NONE Reflexed from [id number] Performed at [facility], [address] [place], [place] [address]    Gram Stain   Final    MODERATE WBC PRESENT,BOTH PMN AND MONONUCLEAR MODERATE GRAM POSITIVE COCCI MODERATE GRAM NEGATIVE RODS FEW GRAM VARIABLE ROD FEW SQUAMOUS EPITHELIAL CELLS PRESENT    Culture   Final    Consistent with normal respiratory flora. Performed at [facility], [place] [address], [place], [place] [address]    Report Status 03/23/2019 FINAL  Final  Expectorated sputum assessment w rflx to resp cult     Status: None   Collection Time: 03/21/19  7:21 PM   Specimen: Expectorated Sputum  Result Value Ref Range Status   Specimen Description SPU  Final   Special Requests Normal  Final   Sputum evaluation   Final    Sputum specimen not acceptable for testing.  Please recollect.   NOTIFIED [facility] 03/21/19 AT 2027 HS Performed at [facility], [address] [place], [place] [address]    Report Status 03/21/2019 FINAL  Final     Labs: BNP (last 3 results) Recent Labs    03/20/19 1105  BNP [id number]*   Basic Metabolic Panel: Recent Labs  Lab 03/20/19 1105 03/22/19 0352 03/23/19 0829 03/24/19 0458 03/25/19 0512  NA 135 138 139 137 139   K 4.5 4.3 4.2 4.7 4.7  CL 98 107 99 102 103  CO2 27 25 27 26 28   GLUCOSE 137* 144* 150* 138* 131*  BUN 18 28* 29* 28* 24*  CREATININE 1.21 0.95 1.06 0.93 0.97  CALCIUM 8.7* 8.3* 8.8* 8.5* 8.7*   Liver Function Tests: Recent Labs  Lab 03/20/19 1146 03/24/19 0458  AST 22 20  ALT 15 21  ALKPHOS 55 49  BILITOT 1.0 0.6  PROT 7.7 6.8  ALBUMIN 3.1* 2.7*   No results for input(s): LIPASE, AMYLASE in the last 168 hours. No results for input(s): AMMONIA  in the last 168 hours. CBC: Recent Labs  Lab 03/20/19 1105 03/22/19 0352 03/23/19 0829 03/24/19 0458 03/25/19 0512  WBC 7.5 11.3* 7.1 11.1* 9.2  HGB 14.1 11.9* 13.3 13.1 13.9  HCT 42.0 35.5* 40.0 39.3 41.6  MCV 95.5 94.9 94.6 93.8 94.8  PLT 315 304 320 326 321   Cardiac Enzymes: No results for input(s): CKTOTAL, CKMB, CKMBINDEX, TROPONINI in the last 168 hours. BNP: Invalid input(s): POCBNP CBG: No results for input(s): GLUCAP in the last 168 hours. D-Dimer No results for input(s): DDIMER in the last 72 hours. Hgb A1c No results for input(s): HGBA1C in the last 72 hours. Lipid Profile No results for input(s): CHOL, HDL, LDLCALC, TRIG, CHOLHDL, LDLDIRECT in the last 72 hours. Thyroid function studies No results for input(s): TSH, T4TOTAL, T3FREE, THYROIDAB in the last 72 hours.  Invalid input(s): FREET3 Anemia work up No results for input(s): VITAMINB12, FOLATE, FERRITIN, TIBC, IRON, RETICCTPCT in the last 72 hours. Urinalysis    Component Value Date/Time   COLORURINE YELLOW (A) 12/08/2016 0938   APPEARANCEUR Hazy (A) 09/24/2018 1108   LABSPEC 1.015 12/08/2016 0938   PHURINE 6.0 12/08/2016 0938   GLUCOSEU Negative 09/24/2018 1108   HGBUR SMALL (A) 12/08/2016 0938   BILIRUBINUR Negative 09/24/2018 1108   KETONESUR NEGATIVE 12/08/2016 0938   PROTEINUR Negative 09/24/2018 1108   PROTEINUR NEGATIVE 12/08/2016 0938   NITRITE Negative 09/24/2018 1108   NITRITE NEGATIVE 12/08/2016 0938   LEUKOCYTESUR Negative  09/24/2018 1108   Sepsis Labs Invalid input(s): PROCALCITONIN,  WBC,  LACTICIDVEN Microbiology Recent Results (from the past 240 hour(s))  SARS CORONAVIRUS 2 (TAT 6-24 HRS) Nasopharyngeal Nasopharyngeal Swab     Status: None   Collection Time: 03/20/19  1:21 PM   Specimen: Nasopharyngeal Swab  Result Value Ref Range Status   SARS Coronavirus 2 NEGATIVE NEGATIVE Final    Comment: (NOTE) SARS-CoV-2 target nucleic acids are NOT DETECTED. The SARS-CoV-2 RNA is generally detectable in upper and lower respiratory specimens during the acute phase of infection. Negative results do not preclude SARS-CoV-2 infection, do not rule out co-infections with other pathogens, and should not be used as the sole basis for treatment or other patient management decisions. Negative results must be combined with clinical observations, patient history, and epidemiological information. The expected result is Negative. Fact Sheet for Patients: SugarRoll.be Fact Sheet for Healthcare Providers: https://www.woods-mathews.com/ This test is not yet approved or cleared by the [place] FDA and  has been authorized for detection and/or diagnosis of SARS-CoV-2 by FDA under an Emergency Use Authorization (EUA). This EUA will remain  in effect (meaning this test can be used) for the duration of the COVID-19 declaration under Section 56 4(b)(1) of the Act, 21 U.S.C. section 360bbb-3(b)(1), unless the authorization is terminated or revoked sooner. Performed at [facility], [place] [address], [place], [place] [address]   CULTURE, BLOOD (ROUTINE X 2) w Reflex to ID Panel     Status: None   Collection Time: 03/20/19  5:48 PM   Specimen: BLOOD  Result Value Ref Range Status   Specimen Description BLOOD BLOOD RIGHT HAND  Final   Special Requests   Final    BOTTLES DRAWN AEROBIC AND ANAEROBIC Blood Culture adequate volume   Culture   Final    NO GROWTH 5 DAYS Performed  at [facility], [address] [place], [place] [address]    Report Status 03/25/2019 FINAL  Final  CULTURE, BLOOD (ROUTINE X 2) w Reflex to ID  Panel     Status: None   Collection Time: 03/20/19  5:48 PM   Specimen: BLOOD  Result Value Ref Range Status   Specimen Description BLOOD RIGHT ANTECUBITAL  Final   Special Requests   Final    BOTTLES DRAWN AEROBIC AND ANAEROBIC Blood Culture adequate volume   Culture   Final    NO GROWTH 5 DAYS Performed at [facility], [address] [place], [place] [address]    Report Status 03/25/2019 FINAL  Final  Culture, sputum-assessment     Status: None   Collection Time: 03/20/19  9:45 PM   Specimen: Expectorated Sputum  Result Value Ref Range Status   Specimen Description EXPECTORATED SPUTUM  Final   Special Requests NONE  Final   Sputum evaluation   Final    THIS SPECIMEN IS ACCEPTABLE FOR SPUTUM CULTURE Performed at [facility], [address], [place], [place] [address]    Report Status 03/20/2019 FINAL  Final  Culture, respiratory     Status: None   Collection Time: 03/20/19  9:45 PM  Result Value Ref Range Status   Specimen Description   Final    EXPECTORATED SPUTUM Performed at [facility], [address], [place], [place] [address]    Special Requests   Final    NONE Reflexed from [id number] Performed at [facility], [address] [place], [place] [address]    Gram Stain   Final    MODERATE WBC PRESENT,BOTH PMN AND MONONUCLEAR MODERATE GRAM POSITIVE COCCI MODERATE GRAM NEGATIVE RODS FEW GRAM VARIABLE ROD FEW SQUAMOUS EPITHELIAL CELLS PRESENT    Culture   Final    Consistent with normal respiratory flora. Performed at [facility], [place] [address], [place], [place] [address]    Report Status 03/23/2019 FINAL  Final  Expectorated sputum assessment w rflx to resp cult     Status: None   Collection Time: 03/21/19  7:21 PM   Specimen: Expectorated Sputum  Result Value  Ref Range Status   Specimen Description SPU  Final   Special Requests Normal  Final   Sputum evaluation   Final    Sputum specimen not acceptable for testing.  Please recollect.   NOTIFIED MARSHA [facility] 03/21/19 AT 2027 HS Performed at [facility], [address] [place],  [address]    Report Status 03/21/2019 FINAL  Final     Time coordinating discharge: Over 30 minutes  SIGNED:   Wyvonnia Dusky, MD  [facility] 03/25/2019, 4:27 PM Pager   If 7PM-7AM, please contact night-coverage [email or web address]

## 2019-03-25 NOTE — Progress Notes (Signed)
Physical Therapy Treatment Patient Details Name: Scott [name] MRN: [id number] DOB: 24-May-1940 Today's Date: 03/25/2019    History of Present Illness Scott [name] is a 79 y.o. M with CAD last PCI >69yr, ischemic CM, sCHF EF 55% with chronic respiratory failure on 3L at baseline, PVD, HTN, and hx PE on Eliquis who presented with fever, cough, SOB for 1 week. Patient reports over the past 6 weeks his pulmonologist placed him on 6L of O2 and he has been using a RW at home for stability since, becauase he gets "dizzy" with his SOB    PT Comments    Pt alert, oriented, agreeable to PT. Pt on 4L via [place] throughout session, spO2 monitored continuously. Pt performed  Bed mobility and transfers modI/I. Pt able to ambulated >131ft with CGA/supervision, no AD. Initially cued for activity pacing to assess spO2 response;second lap able to complete without standing rest break but did exhibit increased fatigue/SOB without the breaks. spO2 monitored during ambulation, first lap around hallways >88%, after second lap without standing rest breaks pt desatted to 86%, able to recover to at least 90% in 2 minutes with cues for PLB and rest. PT and pt discussed importance of activity modification for energy conservation and pursed lip breathing to address SOB/desaturation, pt verbalized understanding. Pt in bed, with lab staff in room at end of session. Discharge plan updated to HHPT with supervision for mobility/OOB due to pt progress, MD/RN/CM informed.    Follow Up Recommendations  Home health PT;Supervision for mobility/OOB     Equipment Recommendations  Rolling walker with 5" wheels    Recommendations for Other Services       Precautions / Restrictions Precautions Precautions: Fall Precaution Comments: watch O2 Restrictions Weight Bearing Restrictions: No    Mobility  Bed Mobility Overal bed mobility: Independent                Transfers Overall transfer level: Modified independent                   Ambulation/Gait Ambulation/Gait assistance: Min guard;Supervision           General Gait Details: Pt able to ambulated >167ft with CGA/supervision. Initially cued for activity pacing to assess spO2 response;second lap able to complete without standing rest break but did exhibit increased fatigue/SOB without the breaks.   Stairs             Wheelchair Mobility    Modified Rankin (Stroke Patients Only)       Balance Overall balance assessment: Mild deficits observed, not formally tested                                          Cognition Arousal/Alertness: Awake/alert Behavior During Therapy: WFL for tasks assessed/performed Overall Cognitive Status: Within Functional Limits for tasks assessed                                        Exercises Other Exercises Other Exercises: Pt and PT discussed importance of continued breath with exertion Other Exercises: spO2 monitored continuously, during ambulation pt exhibited mild SOB, >88%, after >15ft pt in bed, desatted to 86%, able to recover to at least 90% in 2 minutes with cues for PLB. Other Exercises: Pt educated on pursed lip breathing, fair carryover noted  General Comments        Pertinent Vitals/Pain Pain Assessment: No/denies pain    Home Living                      Prior Function            PT Goals (current goals can now be found in the care plan section) Progress towards PT goals: Progressing toward goals    Frequency    Min 2X/week      PT Plan Discharge plan needs to be updated    Co-evaluation              AM-PAC PT "6 Clicks" Mobility   Outcome Measure  Help needed turning from your back to your side while in a flat bed without using bedrails?: A Little Help needed moving from lying on your back to sitting on the side of a flat bed without using bedrails?: A Little Help needed moving to and from a bed to a chair (including  a wheelchair)?: A Little Help needed standing up from a chair using your arms (e.g., wheelchair or bedside chair)?: A Little Help needed to walk in hospital room?: A Little Help needed climbing 3-5 steps with a railing? : A Little 6 Click Score: 18    End of Session Equipment Utilized During Treatment: Gait belt Activity Tolerance: Patient tolerated treatment well Patient left: in bed;with call bell/phone within reach;with family/visitor present;Other (comment)(lab at bedside) Nurse Communication: Mobility status PT Visit Diagnosis: Unsteadiness on feet (R26.81);Other abnormalities of gait and mobility (R26.89);Muscle weakness (generalized) (M62.81)     Time: [id number] PT Time Calculation (min) (ACUTE ONLY): 25 min  Charges:  $Therapeutic Exercise: 23-37 mins                    Lieutenant Diego PT, DPT 12:10 PM,03/25/19

## 2019-03-25 NOTE — Progress Notes (Signed)
Pulmonary Medicine          Date: 03/25/2019,   MRN# [id number] Scott [name] 06-Oct-1940  AdmissionWeight: 76.2 kg                 CurrentWeight: 76.2 kg  Referring physician: Dr. Loleta Books    CHIEF COMPLAINT:   Acute on chronic hypoxemic respiratory failure   SUBJECTIVE   Patient sitting up in bed with wife at bedside.     Patient did well, he was able to walk >131ft and had very mild and only transient hypoxemia from >90 to 86 then back to >90% within 2 min.    He is cleared for d/c home and to have home health PT , I do not think he needs inpatient SNF/rehab   PAST MEDICAL HISTORY   Past Medical History:  Diagnosis Date  . Arthritis    right hip, since a fall  . CAD (coronary artery disease)    a. 04/2012 Cath: 3VD->Med Rx;  b. 04/2013 PCI RCA (2 DES); c. 03/2014 PCI: LAD 80 (3.0x23 Xience Alpine DES); d. 06/2014 CABG x 2 ([facility]) LIMA->LAD, VG->OM; e. 12/2014 Cath ([facility]): patent grafts->Med Rx; f. 08/2015 Cath: patent grafts; g. 11/2016 Cath: LM min irregs, LAD 20p, 49m, LCX 100p/m, RCA 20ost, 10p/m/d, VG->OM1 nl, LIMA->dLAD nl; f. 07/2017 MV: No isch, EF 51%.  . Cardiomyopathy, ischemic    a. 04/2012 Echo: EF 40-45%;  b. 08/2013 Echo: EF 45-50%; c. 01/2015 Echo: EF 40-45%; d. 07/2016 Echo: EF 60-65%; e. 09/2017 Echo: EF 55-60%, Gr1 DD.  . Carotid arterial disease ([place])    a. 05/2013 Carotid U/S; bilat 40-50% ICA stenosis.  . Chronic Chest Pain    USES NITRO  . Chronic systolic CHF (congestive heart failure) ([place])    a. 01/2015 Echo: EF 40-45%; b. 07/2016 Echo: EF 60-65%; c. 09/2017 Echo: EF 55-60%, Gr1 DD, nl RV fxn.  . Cough   . Diverticulosis   . Dizziness   . Dyspnea    WITH EXERTION  . Headache 1970's   migraine  . History of blood transfusion 2016   post op  . Hyperlipidemia   . Hypertension   . Iron deficiency anemia due to chronic blood loss 09/11/2016  . Myocardial infarction [facility])    unsure of when  . Pulmonary emboli ([place]) 07/2016   a. On Xarelto.  .  Reflux esophagitis   . Sternal pain    a. 03/2016 s/p Sternal wire removal; b. 05/2016 s/p redo median sternotomy for sternal debridement and sternal plating.  . Syncope 04/2018   CARDIOLOGIST WAS DR Rockey Situ AND HE WAS AWARE-NOW PT SEES PARASCHOS  . Tubular adenoma of colon      SURGICAL HISTORY   Past Surgical History:  Procedure Laterality Date  . CARDIAC CATHETERIZATION  05/01/2012  . CARDIAC CATHETERIZATION  04/2013   [facility];x3 stent  . CARDIAC CATHETERIZATION  01/11/15    [facility]  . CARDIAC CATHETERIZATION N/A 09/16/2015   Procedure: LEFT HEART CATH AND CORS/GRAFTS ANGIOGRAPHY;  Surgeon: Minna Merritts, MD;  Location: [place] [facility];  Service: Cardiovascular;  Laterality: N/A;  . CARPAL TUNNEL RELEASE     right hand  . CATARACT EXTRACTION     LEFT  . CATARACT EXTRACTION W/PHACO Left 09/16/2014   Procedure: CATARACT EXTRACTION PHACO AND INTRAOCULAR LENS PLACEMENT (IOC);  Surgeon: Leandrew Koyanagi, MD;  Location: [place];  Service: Ophthalmology;  Laterality: Left;  . COLONOSCOPY    . COLONOSCOPY WITH PROPOFOL  N/A 01/28/2016   Procedure: COLONOSCOPY WITH PROPOFOL;  Surgeon: Lollie Sails, MD;  Location: [facility];  Service: Endoscopy;  Laterality: N/A;  . CORONARY ANGIOPLASTY WITH STENT PLACEMENT  04/13/2014  . CORONARY ARTERY BYPASS GRAFT  06-26-14   x3 bypasses  . CORONARY PRESSURE WIRE/FFR WITH 3D MAPPING N/A 12/10/2018   Procedure: Suzette Battiest PRESSURE WIRE/FFR STUDY;  Surgeon: Isaias Cowman, MD;  Location: [place] [facility];  Service: Cardiovascular;  Laterality: N/A;  . CYSTOSCOPY N/A 08/14/2018   Procedure: CYSTOSCOPY;  Surgeon: Abbie Sons, MD;  Location: [facility];  Service: Urology;  Laterality: N/A;  . DE QUERVAIN'S RELEASE Left 08/22/2012  . ESOPHAGOGASTRODUODENOSCOPY (EGD) WITH PROPOFOL N/A 04/26/2015   Procedure: ESOPHAGOGASTRODUODENOSCOPY (EGD) WITH PROPOFOL;  Surgeon: Hulen Luster, MD;  Location[facility];  Service:  Gastroenterology;  Laterality: N/A;  . ESOPHAGOGASTRODUODENOSCOPY (EGD) WITH PROPOFOL N/A 05/29/2017   Procedure: ESOPHAGOGASTRODUODENOSCOPY (EGD) WITH PROPOFOL;  Surgeon: Lollie Sails, MD;  Location: [facility] ENDOSCOPY;  Service: Endoscopy;  Laterality: N/A;  . INTRAVASCULAR ULTRASOUND/IVUS N/A 12/10/2018   Procedure: Intravascular Ultrasound/IVUS;  Surgeon: Isaias Cowman, MD;  Location: [place] [facility];  Service: Cardiovascular;  Laterality: N/A;  . LEFT HEART CATH AND CORONARY ANGIOGRAPHY N/A 12/04/2016   Procedure: LEFT HEART CATH AND CORS/GRAFTS  ANGIOGRAPHY;  Surgeon: Wellington Hampshire, MD;  Location: [place] [facility];  Service: Cardiovascular;  Laterality: N/A;  . LEFT HEART CATH AND CORS/GRAFTS ANGIOGRAPHY N/A 04/09/2018   Procedure: LEFT HEART CATH AND CORS/GRAFTS ANGIOGRAPHY;  Surgeon: Isaias Cowman, MD;  Location: [place] [facility];  Service: Cardiovascular;  Laterality: N/A;  . LEFT HEART CATH AND CORS/GRAFTS ANGIOGRAPHY N/A 12/10/2018   Procedure: LEFT HEART CATH AND CORS/GRAFTS ANGIOGRAPHY;  Surgeon: Isaias Cowman, MD;  Location: [place] [facility];  Service: Cardiovascular;  Laterality: N/A;  . RIB PLATING N/A 05/25/2016   Procedure: STERNAL PLATING;  Surgeon: Melrose Nakayama, MD;  Location: [place];  Service: Thoracic;  Laterality: N/A;  . right shoulder    . STERNAL WIRES REMOVAL N/A 03/30/2016   Procedure: STERNAL WIRES REMOVAL;  Surgeon: Melrose Nakayama, MD;  Location: [place];  Service: Thoracic;  Laterality: N/A;  . TONSILLECTOMY       FAMILY HISTORY   Family History  Problem Relation Age of Onset  . Heart attack Father 42       MI  . Cancer Maternal Uncle      SOCIAL HISTORY   Social History   Tobacco Use  . Smoking status: Former Smoker    Packs/day: 1.00    Years: 35.00    Pack years: 35.00    Types: Cigarettes    Start date: 05/02/2009  . Smokeless tobacco: Former Network engineer Use Topics  . Alcohol use: No  .  Drug use: No     MEDICATIONS    Home Medication:    Current Medication:  Current Facility-Administered Medications:  .  0.9 %  sodium chloride infusion, , Intravenous, PRN, Ivor Costa, MD, Stopped at 03/23/19 1320 .  acetaminophen (TYLENOL) tablet 650 mg, 650 mg, Oral, Q6H PRN, Ivor Costa, MD, 650 mg at 03/22/19 1052 .  albuterol (PROVENTIL) (2.5 MG/3ML) 0.083% nebulizer solution 2.5 mg, 2.5 mg, Nebulization, Q4H PRN, Danford, Suann Larry, MD, 2.5 mg at 03/23/19 0553 .  atorvastatin (LIPITOR) tablet 80 mg, 80 mg, Oral, q1800, Ivor Costa, MD, 80 mg at 03/24/19 1755 .  colchicine tablet 0.6 mg, 0.6 mg, Oral, Daily, Darnise Montag, MD, 0.6 mg at  03/25/19 1023 .  ezetimibe (ZETIA) tablet 10 mg, 10 mg, Oral, Daily, Ivor Costa, MD, 10 mg at 03/25/19 1022 .  feeding supplement (ENSURE ENLIVE) (ENSURE ENLIVE) liquid 237 mL, 237 mL, Oral, BID BM, Danford, Suann Larry, MD, Stopped at 03/25/19 1058 .  furosemide (LASIX) injection 20 mg, 20 mg, Intravenous, Daily, [name], Kaidence Sant, MD, 20 mg at 03/25/19 1131 .  ipratropium-albuterol (DUONEB) 0.5-2.5 (3) MG/3ML nebulizer solution 3 mL, 3 mL, Nebulization, TID, Danford, Suann Larry, MD, 3 mL at 03/25/19 0754 .  mometasone-formoterol (DULERA) 100-5 MCG/ACT inhaler 2 puff, 2 puff, Inhalation, BID, Ottie Glazier, MD, 2 puff at 03/25/19 1022 .  nitroGLYCERIN (NITROSTAT) SL tablet 0.4 mg, 0.4 mg, Sublingual, Q5 min PRN, Ivor Costa, MD .  ondansetron [facility]) injection 4 mg, 4 mg, Intravenous, Q8H PRN, Ivor Costa, MD .  predniSONE (DELTASONE) tablet 20 mg, 20 mg, Oral, Q breakfast, [name], Rubye Strohmeyer, MD, 20 mg at 03/24/19 1755 .  sodium chloride flush (NS) 0.9 % injection 3 mL, 3 mL, Intravenous, Once, Ivor Costa, MD .  sulfamethoxazole-trimethoprim (BACTRIM) 400-80 MG per tablet 1 tablet, 1 tablet, Oral, Q12H, Ottie Glazier, MD, 1 tablet at 03/25/19 1023    ALLERGIES   Contrast media [iodinated diagnostic agents], Metrizamide, Oxycodone hcl, and  Vicodin [hydrocodone-acetaminophen]     REVIEW OF SYSTEMS    Review of Systems:  Gen:  Denies  fever, sweats, chills weigh loss  HEENT: Denies blurred vision, double vision, ear pain, eye pain, hearing loss, nose bleeds, sore throat Cardiac:  No dizziness, chest pain or heaviness, chest tightness,edema Resp:   Denies cough or sputum porduction, shortness of breath,wheezing, hemoptysis,  Gi: Denies swallowing difficulty, stomach pain, nausea or vomiting, diarrhea, constipation, bowel incontinence Gu:  Denies bladder incontinence, burning urine Ext:   Denies Joint pain, stiffness or swelling Skin: Denies  skin rash, easy bruising or bleeding or hives Endoc:  Denies polyuria, polydipsia , polyphagia or weight change Psych:   Denies depression, insomnia or hallucinations   Other:  All other systems negative   VS: BP 107/82   Pulse 72   Temp 97.8 F (36.6 C) (Oral)   Resp 20   Ht 5\' 10"  (1.778 m)   Wt 76.2 kg   SpO2 91%   BMI 24.11 kg/m      PHYSICAL EXAM    GENERAL:NAD, no fevers, chills, no weakness no fatigue HEAD: Normocephalic, atraumatic.  EYES: Pupils equal, round, reactive to light. Extraocular muscles intact. No scleral icterus.  MOUTH: Moist mucosal membrane. Dentition intact. No abscess noted.  EAR, NOSE, THROAT: Clear without exudates. No external lesions.  NECK: Supple. No thyromegaly. No nodules. No JVD.  PULMONARY: Decreased breath sounds bilaterally without adventitious lung sounds CARDIOVASCULAR: S1 and S2. Regular rate and rhythm. No murmurs, rubs, or gallops. No edema. Pedal pulses 2+ bilaterally.  GASTROINTESTINAL: Soft, nontender, nondistended. No masses. Positive bowel sounds. No hepatosplenomegaly.  MUSCULOSKELETAL: No swelling, clubbing, or edema. Range of motion full in all extremities.  NEUROLOGIC: Cranial nerves II through XII are intact. No gross focal neurological deficits. Sensation intact. Reflexes intact.  SKIN: No ulceration, lesions,  rashes, or cyanosis. Skin warm and dry. Turgor intact.  PSYCHIATRIC: Mood, affect within normal limits. The patient is awake, alert and oriented x 3. Insight, judgment intact.       IMAGING    MR ANGIO NECK W WO CONTRAST  Result Date: 02/24/2019 CLINICAL DATA:  Dizziness over the last 3-5 weeks. Balance disturbance. Occluded carotid artery. EXAM: MRA NECK WITHOUT AND  WITH CONTRAST TECHNIQUE: Multiplanar and multiecho pulse sequences of the neck were obtained without and with intravenous contrast. Angiographic images of the neck were obtained using MRA technique without and with intravenous contrast. CONTRAST:  53mL GADAVIST GADOBUTROL 1 MMOL/ML IV SOLN COMPARISON:  02/17/2019.  02/18/2019 FINDINGS: Branching pattern of the arch is normal. No origin stenoses. Both common carotid arteries are widely patent to their respective bifurcation. On the right, there is mild atherosclerotic irregularity of the ICA bulb but no stenosis. Cervical ICA widely patent beyond that. On the left, there is mild atherosclerotic irregularity in the ICA bulb but no stenosis. Cervical ICA widely patent beyond that. Both vertebral artery origins are patent. The left vertebral artery is dominant. Both vertebral arteries appear normal through the cervical region to the foramen magnum. The examination also includes the majority of the intracranial circulation. No anterior circulation stenosis or occlusion is seen. No basilar stenosis. Posterior circulation branch vessels show flow. Right PCA takes fetal origin the anterior circulation. IMPRESSION: Mild atherosclerotic irregularity of both ICA bulb regions but no carotid stenosis. No evidence of occlusion or significant focal vascular lesion. These results will be called to the ordering clinician or representative by the Radiologist Assistant, and communication documented in the PACS or zVision Dashboard. Electronically Signed   By: [place] Chimes M.D.   On: 02/24/2019 12:30   DG Chest  Port 1 View  Result Date: 03/20/2019 CLINICAL DATA:  Chest pain and shortness of breath. EXAM: PORTABLE CHEST 1 VIEW COMPARISON:  01/05/2019 FINDINGS: 11:19 a.m. Low volume film. Patchy peripheral airspace opacity noted bilaterally, right greater than left. Cardiopericardial silhouette is at upper limits of normal for size. No substantial pleural effusion. Sternal fusion hardware evident. Telemetry leads overlie the chest. IMPRESSION: Right greater than left patchy bilateral ground-glass airspace disease, peripherally predominant. Imaging features suggest multifocal pneumonia and atypical etiology would be a consideration. Electronically Signed   By: Misty Stanley M.D.   On: 03/20/2019 11:34   ECHOCARDIOGRAM COMPLETE  Result Date: 03/22/2019    ECHOCARDIOGRAM REPORT   Patient Name:   Scott [name] Date of Exam: 03/21/2019 Medical Rec #:  [id number]      Height:       70.0 in Accession #:    [id number]     Weight:       168.0 lb Date of Birth:  17-Jan-1941       BSA:          1.938 m Patient Age:    79 years       BP:           113/59 mmHg Patient Gender: M              HR:           81 bpm. Exam Location:  [facility] Procedure: 2D Echo Indications:     Mitral Valve Insufficiency 424.0/134.0  History:         Patient has prior history of Echocardiogram examinations, most                  recent 10/02/2017. Prior CABG, Signs/Symptoms:Shortness of                  Breath and Chest Pain; Risk Factors:Dyslipidemia.  Sonographer:     Avanell Shackleton Referring Phys:  [id number] Ottie Glazier Diagnosing Phys: Isaias Cowman MD  Sonographer Comments: Technically difficult study due to poor echo windows. IMPRESSIONS  1. Left ventricular ejection fraction, by estimation, is 40  to 45%. The left ventricle has mildly decreased function. The left ventricle has no regional wall motion abnormalities. Left ventricular diastolic parameters are consistent with Grade I diastolic dysfunction (impaired relaxation).  2. Right ventricular  systolic function is normal. The right ventricular size is normal.  3. The mitral valve is normal in structure and function. Mild mitral valve regurgitation. No evidence of mitral stenosis.  4. The aortic valve is normal in structure and function. Aortic valve regurgitation is not visualized. No aortic stenosis is present.  5. The inferior vena cava is normal in size with greater than 50% respiratory variability, suggesting right atrial pressure of 3 mmHg. FINDINGS  Left Ventricle: Left ventricular ejection fraction, by estimation, is 40 to 45%. The left ventricle has mildly decreased function. The left ventricle has no regional wall motion abnormalities. The left ventricular internal cavity size was normal in size. There is no left ventricular hypertrophy. Left ventricular diastolic parameters are consistent with Grade I diastolic dysfunction (impaired relaxation). Right Ventricle: The right ventricular size is normal. No increase in right ventricular wall thickness. Right ventricular systolic function is normal. Left Atrium: Left atrial size was normal in size. Right Atrium: Right atrial size was normal in size. Pericardium: There is no evidence of pericardial effusion. Mitral Valve: The mitral valve is normal in structure and function. Normal mobility of the mitral valve leaflets. Mild mitral valve regurgitation. No evidence of mitral valve stenosis. Tricuspid Valve: The tricuspid valve is normal in structure. Tricuspid valve regurgitation is mild . No evidence of tricuspid stenosis. Aortic Valve: The aortic valve is normal in structure and function. Aortic valve regurgitation is not visualized. No aortic stenosis is present. Pulmonic Valve: The pulmonic valve was normal in structure. Pulmonic valve regurgitation is not visualized. No evidence of pulmonic stenosis. Aorta: The aortic root is normal in size and structure. Venous: The inferior vena cava is normal in size with greater than 50% respiratory  variability, suggesting right atrial pressure of 3 mmHg. IAS/Shunts: No atrial level shunt detected by color flow Doppler.  LEFT VENTRICLE PLAX 2D LVIDd:         5.33 cm      Diastology LVIDs:         4.54 cm      LV e' lateral:   11.10 cm/s LV PW:         0.90 cm      LV E/e' lateral: 4.8 LV IVS:        1.01 cm      LV e' medial:    6.96 cm/s LVOT diam:     1.80 cm      LV E/e' medial:  7.7 LVOT Area:     2.54 cm  LV Volumes (MOD) LV vol d, MOD A2C: 148.0 ml LV vol d, MOD A4C: 141.0 ml LV vol s, MOD A2C: 88.0 ml LV vol s, MOD A4C: 85.7 ml LV SV MOD A2C:     60.0 ml LV SV MOD A4C:     141.0 ml LV SV MOD BP:      59.4 ml RIGHT VENTRICLE            IVC RV S prime:     8.27 cm/s  IVC diam: 1.98 cm LEFT ATRIUM             Index LA diam:        4.70 cm 2.42 cm/m LA Vol (A2C):   42.7 ml 22.03 ml/m LA Vol (A4C):   46.0  ml 23.73 ml/m LA Biplane Vol: 45.4 ml 23.42 ml/m   AORTA Ao Root diam: 3.30 cm MITRAL VALVE MV Area (PHT): 3.21 cm    SHUNTS MV Decel Time: 236 msec    Systemic Diam: 1.80 cm MV E velocity: 53.80 cm/s MV A velocity: 84.90 cm/s MV E/A ratio:  0.63 Isaias Cowman MD Electronically signed by Isaias Cowman MD Signature Date/Time: 03/22/2019/12:37:17 PM    Final              ASSESSMENT/PLAN   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  -will do lasix 20 IV x today to help with O2 requirement. -will d/c zinc and vitaminC to reduce nephrotoxicity while diuresing.    Non-massive hemoptysis -Patient's had recurrent low volume hemoptysis in the past and recently last month which  has self resolved -plan to resume Eliquis after some time off to help resolution of hemoptysis   History of pulmonary embolism -Patient had left upper lobe segmental PE in 2018 which tracked all the way up to left main PA, repeat CT chest showed resolution of clot burden. 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   Patient/Family are satisfied with care plan and all questions have been answered.  This document was prepared using Dragon voice recognition software and may include unintentional dictation errors.     Ottie Glazier, M.D.  Division of [facility]

## 2019-03-25 NOTE — Progress Notes (Signed)
PROGRESS NOTE    Scott [name]  [id number] DOB: [birth year] DOA: 03/20/2019 PCP: Baxter Hire, MD      Assessment & Plan:   Principal Problem:   Multifocal pneumonia Active Problems:   Essential hypertension   PAF (paroxysmal atrial fibrillation) (HCC)   Chronic diastolic heart failure (HCC)   Hyperlipemia   Pulmonary embolus (HCC)   CAD (coronary artery disease)   Acute on chronic hypoxic respiratory failure: initially thought to be CAP or COVID, started on empiric antibiotics, remdesivir and steroids.  COVID PCR subsequently negative.  Remdesivir stopped. Strep pneumo and legionella antigens negative. DDx: pulmonary hemorrhage, COVID fibrosis, atypical/fungal pneumonia. IgG is positive for COVID. Hold all anticoagulation,cardiology prefer to revisit this after an interval, in the office. Continue on bactrim, prednisone, & colchicine as per pulmon. Bronchoscopy in chronic stable state on an outpatient basis as per pulmon. Pulmon following and recs apprec  PAF: continue hold metoprolol due to hypotension. 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 Continue to hold eliquis  Chronic systolic CHF: continue on IV lasix. Echo shows EF back to 40-45%. Strict I/Os, daily weights  Generalized weakness: PT initially recs SNF but now recs home health. Home health orders will be place     DVT prophylaxis: SCDs Code Status: full Family Communication:  Disposition Plan:  Can likely d/c tomorrow if pt continues to clinically improve   Consultants:   pulmon   Procedures:   Antimicrobials: bactrim   Subjective: Pt c/o shortness of breath especially w/ exertion   Objective: Vitals:   03/24/19 1951 03/24/19 2051 03/25/19 0413 03/25/19 0754  BP:  (!) 107/57 137/81   Pulse:  80 65   Resp:  20 20   Temp:   97.8 F (36.6 C) 97.8 F (36.6 C)   TempSrc:  Oral Oral   SpO2: 96% 96% 92% 91%  Weight:      Height:        Intake/Output Summary (Last 24 hours) at 03/25/2019 0810 Last data filed at 03/25/2019 0531 Gross per 24 hour  Intake 118 ml  Output 800 ml  Net -682 ml   Filed Weights   03/20/19 1051  Weight: 76.2 kg    Examination:  General exam: Appears calm and comfortable  Respiratory system: decreased breath sounds b/l Cardiovascular system: S1 & S2 +. No rubs, gallops or clicks.  Gastrointestinal system: Abdomen is nondistended, soft and nontender.  Hypoactive bowel sounds heard. Central nervous system: Alert and oriented. Moves all 4 extremities Psychiatry: Judgement and insight appear normal. Flat mood and affect     Data Reviewed: I have personally reviewed following labs and imaging studies  CBC: Recent Labs  Lab 03/20/19 1105 03/22/19 0352 03/23/19 0829 03/24/19 0458 03/25/19 0512  WBC 7.5 11.3* 7.1 11.1* 9.2  HGB 14.1 11.9* 13.3 13.1 13.9  HCT 42.0 35.5* 40.0 39.3 41.6  MCV 95.5 94.9 94.6 93.8 94.8  PLT 315 304 320 326 [id number]   Basic Metabolic Panel: Recent Labs  Lab 03/20/19 1105 03/22/19 0352 03/23/19 0829 03/24/19 0458 03/25/19 0512  NA 135 138 139 137 139  K 4.5 4.3 4.2 4.7 4.7  CL 98 107 99 102 103  CO2 27 25 27 26 28   GLUCOSE 137* 144* 150* 138* 131*  BUN 18 28* 29* 28* 24*  CREATININE 1.21 0.95 1.06 0.93 0.97  CALCIUM 8.7*  8.3* 8.8* 8.5* 8.7*   GFR: Estimated Creatinine Clearance: 66.9 mL/min (by C-G formula based on SCr of 0.97 mg/dL). Liver Function Tests: Recent Labs  Lab 03/20/19 1146 03/24/19 0458  AST 22 20  ALT 15 21  ALKPHOS 55 49  BILITOT 1.0 0.6  PROT 7.7 6.8  ALBUMIN 3.1* 2.7*   No results for input(s): LIPASE, AMYLASE in the last 168 hours. No results for input(s): AMMONIA in the last 168 hours. Coagulation Profile: No results for input(s): INR, PROTIME in the last 168 hours. Cardiac Enzymes: No results for input(s):  CKTOTAL, CKMB, CKMBINDEX, TROPONINI in the last 168 hours. BNP (last 3 results) No results for input(s): PROBNP in the last 8760 hours. HbA1C: No results for input(s): HGBA1C in the last 72 hours. CBG: No results for input(s): GLUCAP in the last 168 hours. Lipid Profile: No results for input(s): CHOL, HDL, LDLCALC, TRIG, CHOLHDL, LDLDIRECT in the last 72 hours. Thyroid Function Tests: No results for input(s): TSH, T4TOTAL, FREET4, T3FREE, THYROIDAB in the last 72 hours. Anemia Panel: No results for input(s): VITAMINB12, FOLATE, FERRITIN, TIBC, IRON, RETICCTPCT in the last 72 hours. Sepsis Labs: Recent Labs  Lab 03/20/19 1322 03/22/19 0352  PROCALCITON <0.10 0.12    Recent Results (from the past 240 hour(s))  SARS CORONAVIRUS 2 (TAT 6-24 HRS) Nasopharyngeal Nasopharyngeal Swab     Status: None   Collection Time: 03/20/19  1:21 PM   Specimen: Nasopharyngeal Swab  Result Value Ref Range Status   SARS Coronavirus 2 NEGATIVE NEGATIVE Final    Comment: (NOTE) SARS-CoV-2 target nucleic acids are NOT DETECTED. The SARS-CoV-2 RNA is generally detectable in upper and lower respiratory specimens during the acute phase of infection. Negative results do not preclude SARS-CoV-2 infection, do not rule out co-infections with other pathogens, and should not be used as the sole basis for treatment or other patient management decisions. Negative results must be combined with clinical observations, patient history, and epidemiological information. The expected result is Negative. Fact Sheet for Patients: SugarRoll.be Fact Sheet for Healthcare Providers: https://www.woods-mathews.com/ This test is not yet approved or cleared by the [place] FDA and  has been authorized for detection and/or diagnosis of SARS-CoV-2 by FDA under an Emergency Use Authorization (EUA). This EUA will remain  in effect (meaning this test can be used) for the duration of  the COVID-19 declaration under Section 56 4(b)(1) of the Act, 21 U.S.C. section 360bbb-3(b)(1), unless the authorization is terminated or revoked sooner. Performed at [facility], [place] [address], [place], [place] [address]   CULTURE, BLOOD (ROUTINE X 2) w Reflex to ID Panel     Status: None   Collection Time: 03/20/19  5:48 PM   Specimen: BLOOD  Result Value Ref Range Status   Specimen Description BLOOD BLOOD RIGHT HAND  Final   Special Requests   Final    BOTTLES DRAWN AEROBIC AND ANAEROBIC Blood Culture adequate volume   Culture   Final    NO GROWTH 5 DAYS Performed at [facility], [address] [place], [place] [address]    Report Status 03/25/2019 FINAL  Final  CULTURE, BLOOD (ROUTINE X 2) w Reflex to ID Panel     Status: None   Collection Time: 03/20/19  5:48 PM   Specimen: BLOOD  Result Value Ref Range Status   Specimen Description BLOOD RIGHT ANTECUBITAL  Final   Special Requests   Final    BOTTLES DRAWN AEROBIC AND ANAEROBIC Blood Culture adequate volume  Culture   Final    NO GROWTH 5 DAYS Performed at [facility], [address] [place], [place] [address]    Report Status 03/25/2019 FINAL  Final  Culture, sputum-assessment     Status: None   Collection Time: 03/20/19  9:45 PM   Specimen: Expectorated Sputum  Result Value Ref Range Status   Specimen Description EXPECTORATED SPUTUM  Final   Special Requests NONE  Final   Sputum evaluation   Final    THIS SPECIMEN IS ACCEPTABLE FOR SPUTUM CULTURE Performed at [facility], [address], [place], [place] [address]    Report Status 03/20/2019 FINAL  Final  Culture, respiratory     Status: None   Collection Time: 03/20/19  9:45 PM  Result Value Ref Range Status   Specimen Description   Final    EXPECTORATED SPUTUM Performed at [facility], [address] [place], [place] [address]    Special Requests   Final    NONE Reflexed from [id number] Performed at  [facility], [address] [place], [place] [address]    Gram Stain   Final    MODERATE WBC PRESENT,BOTH PMN AND MONONUCLEAR MODERATE GRAM POSITIVE COCCI MODERATE GRAM NEGATIVE RODS FEW GRAM VARIABLE ROD FEW SQUAMOUS EPITHELIAL CELLS PRESENT    Culture   Final    Consistent with normal respiratory flora. Performed at [facility], [place] [address], [place], [place] [address]    Report Status 03/23/2019 FINAL  Final  Expectorated sputum assessment w rflx to resp cult     Status: None   Collection Time: 03/21/19  7:21 PM   Specimen: Expectorated Sputum  Result Value Ref Range Status   Specimen Description SPU  Final   Special Requests Normal  Final   Sputum evaluation   Final    Sputum specimen not acceptable for testing.  Please recollect.   NOTIFIED [facility] 03/21/19 AT 2027 HS Performed at [facility], [address] [place],  [address]    Report Status 03/21/2019 FINAL  Final         Radiology Studies: No results found.      Scheduled Meds: . atorvastatin  80 mg Oral q1800  . colchicine  0.6 mg Oral Daily  . ezetimibe  10 mg Oral Daily  . feeding supplement (ENSURE ENLIVE)  237 mL Oral BID BM  . furosemide  20 mg Intravenous Daily  . ipratropium-albuterol  3 mL Nebulization TID  . mometasone-formoterol  2 puff Inhalation BID  . predniSONE  20 mg Oral Q breakfast  . sodium chloride flush  3 mL Intravenous Once  . sulfamethoxazole-trimethoprim  1 tablet Oral Q12H   Continuous Infusions: . sodium chloride Stopped (03/23/19 1320)     LOS: 5 days    Time spent: 31 mins    Wyvonnia Dusky, MD [facility] Pager [phone]  If 7PM-7AM, please contact night-coverage [email or web address] 03/25/2019, 8:10 AM

## 2019-03-25 NOTE — Progress Notes (Signed)
   03/25/19 1135  Clinical Encounter Type  Visited With Patient and family together  Visit Type Follow-up  Referral From Chaplain  Consult/Referral To Chaplain  While doing rounds Chaplain visited with patient and his wife. Patient said he is doing better and was up walking. Patient said he might be going home tomorrow. Chaplain spoke to wife about taking care of herself and patient agreed. Chaplain offered pastoral presence, empathy, and prayer.

## 2019-03-25 NOTE — TOC Progression Note (Addendum)
Transition of Care [facility]) - Progression Note    Patient Details  Name: Scott [name] MRN: [id number] Date of Birth: 16-Jul-1940  [facility] CM/SW Contact  Beverly Sessions, RN Phone Number: 03/25/2019, 2:07 PM  Clinical Narrative:     PT has seen patient and upgraded recommendations to home health    Patient agreeable to home health services.  Patient states that he does not have a preference of home health agency.  Referral made and accepted by [place] with [facility].  Patient will have a $0 copay   Update: notified by MD patient to DC today.  [place] with [facility] notified   Expected Discharge Plan: [place] Barriers to Discharge: Continued Medical Work up  Expected Discharge Plan and Services Expected Discharge Plan: [place]   Discharge Planning Services: CM Consult   Living arrangements for the past 2 months: Single Family Home                           HH Arranged: RN, PT [facility] Agency: [facility] Date [place]: 03/25/19 Time [place]: 1406 Representative spoke with at [place]: [place] ([place]) Interventions    Readmission Risk Interventions No flowsheet data found.

## 2019-03-25 NOTE — Progress Notes (Signed)
03/25/2019 [place] to be D/C'd Home per MD order.  Discussed prescriptions and follow up appointments with the patient. Prescriptions given to patient, medication list explained in detail. Pt verbalized understanding.  Allergies as of 03/25/2019      Reactions   Contrast Media [iodinated Diagnostic Agents] Hives   Metrizamide Hives   Oxycodone Hcl Itching   Vicodin [hydrocodone-acetaminophen] Itching      Medication List    TAKE these medications   albuterol 108 (90 Base) MCG/ACT inhaler Commonly known as: VENTOLIN HFA Inhale 2 puffs into the lungs every 6 (six) hours as needed for wheezing or shortness of breath.   atorvastatin 80 MG tablet Commonly known as: LIPITOR Take 1 tablet (80 mg total) by mouth daily at 6 PM.   Eliquis 2.5 MG Tabs tablet Generic drug: apixaban Take 2.5 mg by mouth 2 (two) times daily.   ezetimibe 10 MG tablet Commonly known as: ZETIA Take 1 tablet (10 mg total) by mouth daily.   ipratropium 0.06 % nasal spray Commonly known as: ATROVENT Place 2 sprays into both nostrils 3 (three) times daily as needed for rhinitis or allergies.   isosorbide mononitrate 60 MG 24 hr tablet Commonly known as: IMDUR Take 60 mg by mouth 2 (two) times daily.   metoprolol succinate 25 MG 24 hr tablet Commonly known as: TOPROL-XL Take 25 mg by mouth every evening.   mometasone-formoterol 100-5 MCG/ACT Aero Commonly known as: DULERA Inhale 2 puffs into the lungs 2 (two) times daily.   nitroGLYCERIN 0.4 MG SL tablet Commonly known as: NITROSTAT Place 1 tablet (0.4 mg total) under the tongue every 5 (five) minutes as needed for chest pain.   predniSONE 20 MG tablet Commonly known as: DELTASONE Take 1 tablet (20 mg total) by mouth daily with breakfast for 14 days.   ranolazine 1000 MG SR tablet Commonly known as: RANEXA Take 1 tablet (1,000 mg total) by mouth 2 (two) times daily.   sulfamethoxazole-trimethoprim 400-80 MG tablet Commonly known as:  BACTRIM Take 1 tablet by mouth 2 (two) times daily.       Vitals:   03/25/19 1130 03/25/19 1411  BP: 107/82 121/77  Pulse: 72 88  Resp:  16  Temp:  97.9 F (36.6 C)  SpO2:  97%    Skin clean, dry and intact without evidence of skin break down, no evidence of skin tears noted. IV catheter discontinued intact. Site without signs and symptoms of complications. Dressing and pressure applied. Pt denies pain at this time. No complaints noted.  An After Visit Summary was printed and given to the patient. Patient escorted via [place], and D/C home via private auto.  Dola Argyle

## 2019-03-26 ENCOUNTER — Ambulatory Visit: Payer: Self-pay | Admitting: Urology

## 2019-05-15 ENCOUNTER — Emergency Department: Payer: Medicare HMO

## 2019-05-15 ENCOUNTER — Emergency Department
Admission: EM | Admit: 2019-05-15 | Discharge: 2019-05-15 | Disposition: A | Discharge status: Home / Self Care | Payer: Medicare HMO | Attending: Emergency Medicine | Admitting: Emergency Medicine

## 2019-05-15 ENCOUNTER — Other Ambulatory Visit: Payer: Self-pay

## 2019-05-15 DIAGNOSIS — Z79899 Other long term (current) drug therapy: Secondary | ICD-10-CM | POA: Diagnosis not present

## 2019-05-15 DIAGNOSIS — Z955 Presence of coronary angioplasty implant and graft: Secondary | ICD-10-CM | POA: Insufficient documentation

## 2019-05-15 DIAGNOSIS — Z951 Presence of aortocoronary bypass graft: Secondary | ICD-10-CM | POA: Insufficient documentation

## 2019-05-15 DIAGNOSIS — R05 Cough: Secondary | ICD-10-CM | POA: Insufficient documentation

## 2019-05-15 DIAGNOSIS — I2510 Atherosclerotic heart disease of native coronary artery without angina pectoris: Secondary | ICD-10-CM | POA: Insufficient documentation

## 2019-05-15 DIAGNOSIS — R0602 Shortness of breath: Secondary | ICD-10-CM | POA: Insufficient documentation

## 2019-05-15 DIAGNOSIS — Z87891 Personal history of nicotine dependence: Secondary | ICD-10-CM | POA: Diagnosis not present

## 2019-05-15 DIAGNOSIS — I110 Hypertensive heart disease with heart failure: Secondary | ICD-10-CM | POA: Diagnosis not present

## 2019-05-15 DIAGNOSIS — Z8616 Personal history of COVID-19: Secondary | ICD-10-CM | POA: Insufficient documentation

## 2019-05-15 DIAGNOSIS — I5042 Chronic combined systolic (congestive) and diastolic (congestive) heart failure: Secondary | ICD-10-CM | POA: Diagnosis not present

## 2019-05-15 DIAGNOSIS — R0789 Other chest pain: Secondary | ICD-10-CM | POA: Insufficient documentation

## 2019-05-15 DIAGNOSIS — Z7901 Long term (current) use of anticoagulants: Secondary | ICD-10-CM | POA: Diagnosis not present

## 2019-05-15 LAB — CBC
HCT: 41.3 % (ref 39.0–52.0)
Hemoglobin: 13.4 g/dL (ref 13.0–17.0)
MCH: 31.3 pg (ref 26.0–34.0)
MCHC: 32.4 g/dL (ref 30.0–36.0)
MCV: 96.5 fL (ref 80.0–100.0)
Platelets: 222 10*3/uL (ref 150–400)
RBC: 4.28 MIL/uL (ref 4.22–5.81)
RDW: 15.5 % (ref 11.5–15.5)
WBC: 6.5 10*3/uL (ref 4.0–10.5)
nRBC: 0 % (ref 0.0–0.2)

## 2019-05-15 LAB — BASIC METABOLIC PANEL
Anion gap: 6 (ref 5–15)
BUN: 15 mg/dL (ref 8–23)
CO2: 27 mmol/L (ref 22–32)
Calcium: 8.6 mg/dL — ABNORMAL LOW (ref 8.9–10.3)
Chloride: 106 mmol/L (ref 98–111)
Creatinine, Ser: 1.24 mg/dL (ref 0.61–1.24)
GFR calc Af Amer: >60 mL/min (ref >60)
GFR calc non Af Amer: 56 mL/min — ABNORMAL LOW (ref >60)
Glucose, Bld: 105 mg/dL — ABNORMAL HIGH (ref 70–99)
Potassium: 4.3 mmol/L (ref 3.5–5.1)
Sodium: 139 mmol/L (ref 135–145)

## 2019-05-15 LAB — TROPONIN I (HIGH SENSITIVITY)
Troponin I (High Sensitivity): 11 ng/L (ref <18)
Troponin I (High Sensitivity): 11 ng/L (ref <18)

## 2019-05-15 LAB — BRAIN NATRIURETIC PEPTIDE: B Natriuretic Peptide: 60 pg/mL (ref 0.0–100.0)

## 2019-05-15 MED ORDER — DOXYCYCLINE HYCLATE 100 MG PO CAPS: 100.0000 mg | ORAL_CAPSULE | Freq: Two times a day (BID) | ORAL | 0 refills | Status: AC

## 2019-05-15 NOTE — ED Triage Notes (Signed)
pt arrives via ems from home with c/o intermittenet chest pain starting yesterday after yardwork. Hx of MI. Pt a&o x 4 on arrival NAD noted at this time.  Pt states been having pain on and off for several weeks but pain increased yesterday. Pt reports hx of pneumonia in jan and has been on 4L nasal [place]ula oxygen when up moving around.   ems admin 324 asprin  2 nitro spray 20g l as  bp 142/74 hr 70'S 98% room air

## 2019-05-15 NOTE — ED Provider Notes (Addendum)
[facility] Emergency Department Provider Note  ____________________________________________   First MD Initiated Contact with Patient 05/15/19 1249     (approximate)  I have reviewed the triage vital signs and the nursing notes.   HISTORY  Chief Complaint Chest Pain    HPI Scott [name] is a 79 y.o. male  Here with SOB. Pt has a known, extensive h/o CAD, as well as chronic hypoxia related to recent COVID/PNA. He states that over th epast week he has noticed increasing SOB with exertion, particularly when he is out working outside. He has had a mild increased cough occasionally productive of yellow-green sputum, but no fevers. No rigors. Normal appetite. This AM, he had some more sharp, acute pain in his chest which he has felt before with his angina as well as PNA, so he presents for evaluation. Denies leg pain or swelling. Sx improved with nitro and ASA en route and he is now pain free.         Past Medical History:  Diagnosis Date   Arthritis    right hip, since a fall   CAD (coronary artery disease)    a. 04/2012 Cath: 3VD->Med Rx;  b. 04/2013 PCI RCA (2 DES); c. 03/2014 PCI: LAD 80 (3.0x23 Xience Alpine DES); d. 06/2014 CABG x 2 ([facility]) LIMA->LAD, VG->OM; e. 12/2014 Cath ([facility]): patent grafts->Med Rx; f. 08/2015 Cath: patent grafts; g. 11/2016 Cath: LM min irregs, LAD 20p, 68m, LCX 100p/m, RCA 20ost, 10p/m/d, VG->OM1 nl, LIMA->dLAD nl; f. 07/2017 MV: No isch, EF 51%.   Cardiomyopathy, ischemic    a. 04/2012 Echo: EF 40-45%;  b. 08/2013 Echo: EF 45-50%; c. 01/2015 Echo: EF 40-45%; d. 07/2016 Echo: EF 60-65%; e. 09/2017 Echo: EF 55-60%, Gr1 DD.   Carotid arterial disease ([place])    a. 05/2013 Carotid U/S; bilat 40-50% ICA stenosis.   Chronic Chest Pain    USES NITRO   Chronic systolic CHF (congestive heart failure) ([place])    a. 01/2015 Echo: EF 40-45%; b. 07/2016 Echo: EF 60-65%; c. 09/2017 Echo: EF 55-60%, Gr1 DD, nl RV fxn.   Cough    Diverticulosis     Dizziness    Dyspnea    WITH EXERTION   Headache 1970's   migraine   History of blood transfusion 2016   post op   Hyperlipidemia    Hypertension    Iron deficiency anemia due to chronic blood loss 09/11/2016   Myocardial infarction [facility])    unsure of when   Pulmonary emboli ([place]) 07/2016   a. On Xarelto.   Reflux esophagitis    Sternal pain    a. 03/2016 s/p Sternal wire removal; b. 05/2016 s/p redo median sternotomy for sternal debridement and sternal plating.   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   Iron deficiency anemia due to chronic blood loss 09/11/2016   Pulmonary embolus ([place]) 08/31/2016   History of pulmonary embolism 08/16/2016   Protein S deficiency ([place]) 08/16/2016  Panic disorder    Cardiac asthma ([place]) 08/04/2016   Panic attack 08/04/2016   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   Coronary artery disease involving native coronary artery of native heart with angina pectoris with documented spasm (HCC)    Hx of CABG    Ischemic cardiomyopathy    Chronic systolic CHF (congestive heart failure) (HCC)    Chronic Chest Pain    Coronary artery disease involving coronary bypass graft of native heart with angina pectoris with documented spasm (HCC)    Angina pectoris (HCC)    Anxiety    Hyperlipemia    Chronic diastolic heart failure ([place]) 12/21/2014   Unstable angina ([place]) 12/08/2014   PAF  (paroxysmal atrial fibrillation) ([place]) 12/08/2014   Chronic anticoagulation 12/08/2014   Anemia 04/20/2014   Osteoarthritis, 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   Cardiomyopathy, ischemic    Essential hypertension    Bilateral carotid artery disease (HCC)    S/P drug eluting coronary stent placement 05/30/2013   Chest pain with low risk for cardiac etiology 05/05/2013   SOB (shortness of breath) 05/05/2013   Coronary artery disease involving coronary bypass graft of native heart with angina pectoris ([place]) 05/05/2013   Hyperlipidemia 05/05/2013   History of smoking 05/05/2013   Cardiac angina ([place]) 05/05/2013    Past Surgical History:  Procedure Laterality Date   CARDIAC CATHETERIZATION  05/01/2012   CARDIAC CATHETERIZATION  04/2013   [facility];x3 stent   CARDIAC CATHETERIZATION  01/11/15    [facility]   CARDIAC CATHETERIZATION N/A 09/16/2015   Procedure: LEFT HEART CATH AND CORS/GRAFTS ANGIOGRAPHY;  Surgeon: Minna Merritts, MD;  Location: [place] [facility];  Service: Cardiovascular;  Laterality: N/A;   CARPAL TUNNEL RELEASE     right hand   CATARACT EXTRACTION     LEFT   CATARACT EXTRACTION W/PHACO Left 09/16/2014   Procedure: CATARACT EXTRACTION PHACO AND INTRAOCULAR LENS PLACEMENT (IOC);  Surgeon: Leandrew Koyanagi, MD;  Location: [place];  Service: Ophthalmology;  Laterality: Left;   COLONOSCOPY     COLONOSCOPY WITH PROPOFOL N/A 01/28/2016   Procedure: COLONOSCOPY WITH PROPOFOL;  Surgeon: Lollie Sails, MD;  Location: [facility] ENDOSCOPY;  Service: Endoscopy;  Laterality: N/A;   CORONARY ANGIOPLASTY WITH STENT PLACEMENT  04/13/2014   CORONARY ARTERY BYPASS GRAFT  06-26-14   x3 bypasses   CORONARY PRESSURE WIRE/FFR WITH 3D MAPPING N/A 12/10/2018   Procedure: Suzette Battiest PRESSURE WIRE/FFR STUDY;  Surgeon: Isaias Cowman, MD;  Location: [place] [facility];  Service: Cardiovascular;  Laterality: N/A;   CYSTOSCOPY N/A 08/14/2018   Procedure: CYSTOSCOPY;  Surgeon:  Abbie Sons, MD;  Location: [facility] ORS;  Service: Urology;  Laterality: N/A;   DE QUERVAIN'S RELEASE Left 08/22/2012   ESOPHAGOGASTRODUODENOSCOPY (EGD) WITH PROPOFOL N/A 04/26/2015   Procedure: ESOPHAGOGASTRODUODENOSCOPY (EGD) WITH PROPOFOL;  Surgeon: Hulen Luster, MD;  Location: [facility] ENDOSCOPY;  Service: Gastroenterology;  Laterality: N/A;   ESOPHAGOGASTRODUODENOSCOPY (EGD) WITH PROPOFOL N/A 05/29/2017   Procedure: ESOPHAGOGASTRODUODENOSCOPY (EGD) WITH PROPOFOL;  Surgeon: Lollie Sails, MD;  Location: [facility] ENDOSCOPY;  Service: Endoscopy;  Laterality: N/A;   INTRAVASCULAR ULTRASOUND/IVUS N/A 12/10/2018   Procedure: Intravascular Ultrasound/IVUS;  Surgeon: Isaias Cowman, MD;  Location: [place] [facility];  Service: Cardiovascular;  Laterality: N/A;   LEFT HEART CATH AND CORONARY ANGIOGRAPHY N/A 12/04/2016   Procedure: LEFT HEART CATH AND CORS/GRAFTS  ANGIOGRAPHY;  Surgeon: Wellington Hampshire, MD;  Location: [place] [facility];  Service: Cardiovascular;  Laterality: N/A;   LEFT HEART CATH AND CORS/GRAFTS ANGIOGRAPHY N/A  04/09/2018   Procedure: LEFT HEART CATH AND CORS/GRAFTS ANGIOGRAPHY;  Surgeon: Isaias Cowman, MD;  Location: [place] [facility];  Service: Cardiovascular;  Laterality: N/A;   LEFT HEART CATH AND CORS/GRAFTS ANGIOGRAPHY N/A 12/10/2018   Procedure: LEFT HEART CATH AND CORS/GRAFTS ANGIOGRAPHY;  Surgeon: Isaias Cowman, MD;  Location: [place] [facility];  Service: Cardiovascular;  Laterality: N/A;   RIB PLATING N/A 05/25/2016   Procedure: STERNAL PLATING;  Surgeon: Melrose Nakayama, MD;  Location: [place];  Service: Thoracic;  Laterality: N/A;   right shoulder     STERNAL WIRES REMOVAL N/A 03/30/2016   Procedure: STERNAL WIRES REMOVAL;  Surgeon: Melrose Nakayama, MD;  Location: [place];  Service: Thoracic;  Laterality: N/A;   TONSILLECTOMY      Prior to Admission medications   Medication Sig Start Date End Date Taking? Authorizing Provider    albuterol (VENTOLIN HFA) 108 (90 Base) MCG/ACT inhaler Inhale 2 puffs into the lungs every 6 (six) hours as needed for wheezing or shortness of breath. 03/25/19 04/24/19  Wyvonnia Dusky, MD  atorvastatin (LIPITOR) 80 MG tablet Take 1 tablet (80 mg total) by mouth daily at 6 PM. 10/04/17   Gouru, Illene Silver, MD  doxycycline (VIBRAMYCIN) 100 MG capsule Take 1 capsule (100 mg total) by mouth 2 (two) times daily for 7 days. 05/15/19 05/22/19  Duffy Bruce, MD  ELIQUIS 2.5 MG TABS tablet Take 2.5 mg by mouth 2 (two) times daily. 03/19/19   [provider]  ezetimibe (ZETIA) 10 MG tablet Take 1 tablet (10 mg total) by mouth daily. 12/07/17   Minna Merritts, MD  ipratropium (ATROVENT) 0.06 % nasal spray Place 2 sprays into both nostrils 3 (three) times daily as needed for rhinitis or allergies. 12/31/18   [provider]  isosorbide mononitrate (IMDUR) 60 MG 24 hr tablet Take 60 mg by mouth 2 (two) times daily.     [provider]  metoprolol succinate (TOPROL-XL) 25 MG 24 hr tablet Take 25 mg by mouth every evening. 03/18/19   [provider]  mometasone-formoterol (DULERA) 100-5 MCG/ACT AERO Inhale 2 puffs into the lungs 2 (two) times daily. 03/25/19 04/24/19  Wyvonnia Dusky, MD  nitroGLYCERIN (NITROSTAT) 0.4 MG SL tablet Place 1 tablet (0.4 mg total) under the tongue every 5 (five) minutes as needed for chest pain. 12/07/17   Minna Merritts, MD  ranolazine (RANEXA) 1000 MG SR tablet Take 1 tablet (1,000 mg total) by mouth 2 (two) times daily. 12/07/17   Minna Merritts, MD    Allergies Contrast media [iodinated diagnostic agents], Metrizamide, Oxycodone hcl, and Vicodin [hydrocodone-acetaminophen]  Family History  Problem Relation Age of Onset   Heart attack Father 35       MI   Cancer Maternal Uncle     Social History Social History   Tobacco Use   Smoking status: Former Smoker    Packs/day: 1.00    Years: 35.00    Pack years: 35.00    Types:  Cigarettes    Start date: 05/02/2009   Smokeless tobacco: Former Systems developer  Substance Use Topics   Alcohol use: No   Drug use: No    Review of Systems  Review of Systems  Constitutional: Positive for fatigue. Negative for chills and fever.  HENT: Negative for sore throat.   Respiratory: Positive for chest tightness and shortness of breath.   Cardiovascular: Positive for chest pain.  Gastrointestinal: Negative for abdominal pain.  Genitourinary: Negative for flank pain.  Musculoskeletal: Negative for neck pain.  Skin: Negative for rash and wound.  Allergic/Immunologic: Negative for immunocompromised state.  Neurological: Negative for weakness and numbness.  Hematological: Does not bruise/bleed easily.  All other systems reviewed and are negative.    ____________________________________________  PHYSICAL EXAM:      VITAL SIGNS: ED Triage Vitals  Enc Vitals Group     BP 05/15/19 1240 (!) 142/68     Pulse Rate 05/15/19 1240 72     Resp 05/15/19 1240 (!) 21     Temp 05/15/19 1241 98.1 F (36.7 C)     Temp Source 05/15/19 1241 Oral     SpO2 05/15/19 1240 96 %     Weight 05/15/19 1242 172 lb (78 kg)     Height 05/15/19 1242 5\' 10"  (1.778 m)     Head Circumference --      Peak Flow --      Pain Score 05/15/19 1242 0     Pain Loc --      Pain Edu? --      Excl. in [place]? --      Physical Exam Vitals and nursing note reviewed.  Constitutional:      General: He is not in acute distress.    Appearance: He is well-developed.  HENT:     Head: Normocephalic and atraumatic.  Eyes:     Conjunctiva/sclera: Conjunctivae normal.  Cardiovascular:     Rate and Rhythm: Normal rate and regular rhythm.     Heart sounds: Normal heart sounds.  Pulmonary:     Effort: Pulmonary effort is normal. Tachypnea present. No respiratory distress.     Breath sounds: Examination of the right-lower field reveals rales. Examination of the left-lower field reveals rales. Rales present. No wheezing.    Abdominal:     General: There is no distension.  Musculoskeletal:     Cervical back: Neck supple.  Skin:    General: Skin is warm.     Capillary Refill: Capillary refill takes less than 2 seconds.     Findings: No rash.  Neurological:     Mental Status: He is alert and oriented to person, place, and time.     Motor: No abnormal muscle tone.       ____________________________________________   LABS (all labs ordered are listed, but only abnormal results are displayed)  Labs Reviewed  BASIC METABOLIC PANEL - Abnormal; Notable for the following components:      Result Value   Glucose, Bld 105 (*)    Calcium 8.6 (*)    GFR calc non Af Amer 56 (*)    All other components within normal limits  CBC  BRAIN NATRIURETIC PEPTIDE  TROPONIN I (HIGH SENSITIVITY)  TROPONIN I (HIGH SENSITIVITY)    ____________________________________________  EKG: Normal ainus rhythm, VR 69. QRS 145, QTc 481. Intraventricular block noted, unchanged form prior. No acute ST elevations or depressions. ________________________________________  RADIOLOGY All imaging, including plain films, CT scans, and ultrasounds, independently reviewed by me, and interpretations confirmed via formal radiology reads.  ED MD interpretation:   CXR: Chronic interstitial thickening, query ILD, no new consolidation  Official radiology report(s): DG Chest 2 View  Result Date: 05/15/2019 CLINICAL DATA:  c/o intermittenet chest pain starting yesterday after yardwork. Hx of MI. Pt a&o x 4 on arrival NAD noted at this time. Pt states been having pain on and off for several weeks but pain increased yesterday. Pt reports hx of pneumonia in jan and has been on 4L nasal  [place] oxygen when up moving around. EXAM: CHEST - 2 VIEW COMPARISON:  03/25/2019 and older exams. FINDINGS: Changes from prior CABG surgery are stable. The cardiac silhouette is normal in size. No mediastinal or hilar masses or convincing adenopathy. There are  bilateral irregular interstitial type opacities and subtle intervening hazy airspace opacities that predominate in the lower lungs, particularly the lower lobes, similar to the prior radiograph. No new lung abnormalities. No pleural effusion or pneumothorax. Skeletal structures are grossly intact. IMPRESSION: 1. Bilateral lung opacities, primarily interstitial type opacities, which predominate in the lower lungs. Although the findings may reflect residual infection, interstitial fibrosis should be considered. There are no new lung abnormalities. Electronically Signed   By: Lajean Manes M.D.   On: 05/15/2019 13:48    ____________________________________________  PROCEDURES   Procedure(s) performed (including Critical Care):  .1-3 Lead EKG Interpretation Performed by: Duffy Bruce, MD Authorized by: Duffy Bruce, MD     Interpretation: normal     ECG rate:  60-70s   ECG rate assessment: normal     Rhythm: sinus rhythm     Ectopy: none   Comments:     Indication: SOB, chest pain    ____________________________________________  INITIAL IMPRESSION / MDM / ASSESSMENT AND PLAN / ED COURSE  As part of my medical decision making, I reviewed the following data within the [place] notes reviewed and incorporated, Old chart reviewed, Notes from prior ED visits, and [place] Controlled Substance Database       *Scott [name] was evaluated in Emergency Department on 05/15/2019 for the symptoms described in the history of present illness. He was evaluated in the context of the global COVID-19 pandemic, which necessitated consideration that the patient might be at risk for infection with the SARS-CoV-2 virus that causes COVID-19. Institutional protocols and algorithms that pertain to the evaluation of patients at risk for COVID-19 are in a state of rapid change based on information released by regulatory bodies including the CDC and federal and state organizations. These  policies and algorithms were followed during the patient's care in the ED.  Some ED evaluations and interventions may be delayed as a result of limited staffing during the pandemic.*     Medical Decision Making:  79 yo M here with recurrent, acute on chronic CP and SOB. EKG is nonischemic, and trop neg x 2 in ED. CP free after ASA and nitroglycerin. He is on his baseline O2 PRN and reports no increased WOB at rest. CXR shows baseline ILD. Labs otherwise overall reassuring without signs of CHF, renal failure, or alternative etiologies.  Discussed with Dr. Saralyn Pilar, pt's Cardiologist. Pt also sees Dr. [name] with Pulmonary. Per their report, pt has known, long h/o recurrent SOB and CP of similar quality. Given neg trop x 2, Dr. Saralyn Pilar does not suspect he needs cardiac eval or admission and can be managed as outpt. From a pulm perspective, given his sputum production, will give brief course of doxy but he is satting well in no distress. Will have him f/u in clinic. Doubt acute significant PNA. Return precautions given.  ____________________________________________  FINAL CLINICAL IMPRESSION(S) / ED DIAGNOSES  Final diagnoses:  Shortness of breath  Atypical chest pain     MEDICATIONS GIVEN DURING THIS VISIT:  Medications - No data to display   ED Discharge Orders         Ordered    doxycycline (VIBRAMYCIN) 100 MG capsule  2 times daily     05/15/19  1532           Note:  This document was prepared using Dragon voice recognition software and may include unintentional dictation errors.   Duffy Bruce, MD 05/15/19 Jon Billings, MD 05/15/19 2005

## 2019-05-15 NOTE — Discharge Instructions (Signed)
I would recommend calling your pulmonary doctor to set up a follow-up in the next week.  For now, given your increased sputum, we are going to prescribe a short course of an antibiotic.  Continue your inhalers.  Call Dr. Saralyn Pilar for follow-up.  Continue your usual medications with nitroglycerin as needed.

## 2019-07-22 ENCOUNTER — Other Ambulatory Visit: Payer: Self-pay | Admitting: Pulmonary Disease

## 2019-07-22 DIAGNOSIS — J849 Interstitial pulmonary disease, unspecified: Secondary | ICD-10-CM

## 2019-08-01 ENCOUNTER — Ambulatory Visit
Admission: RE | Admit: 2019-08-01 | Discharge: 2019-08-01 | Disposition: A | Discharge status: Home / Self Care | Payer: Medicare HMO | Source: Ambulatory Visit | Attending: Pulmonary Disease | Admitting: Pulmonary Disease

## 2019-08-01 ENCOUNTER — Other Ambulatory Visit: Payer: Self-pay

## 2019-08-01 DIAGNOSIS — J849 Interstitial pulmonary disease, unspecified: Secondary | ICD-10-CM | POA: Diagnosis not present

## 2019-12-08 ENCOUNTER — Other Ambulatory Visit: Payer: Self-pay

## 2019-12-08 ENCOUNTER — Encounter: Payer: Self-pay | Admitting: Oncology

## 2019-12-08 ENCOUNTER — Inpatient Hospital Stay: Payer: Medicare HMO | Attending: Oncology

## 2019-12-08 ENCOUNTER — Inpatient Hospital Stay: Payer: Medicare HMO | Admitting: Oncology

## 2019-12-08 VITALS — BP 112/66 | HR 57 | Temp 96.8°F | Resp 16 | Wt 174.9 lb

## 2019-12-08 DIAGNOSIS — Z86711 Personal history of pulmonary embolism: Secondary | ICD-10-CM | POA: Insufficient documentation

## 2019-12-08 DIAGNOSIS — Z7901 Long term (current) use of anticoagulants: Secondary | ICD-10-CM | POA: Diagnosis not present

## 2019-12-08 DIAGNOSIS — D509 Iron deficiency anemia, unspecified: Secondary | ICD-10-CM | POA: Diagnosis not present

## 2019-12-08 DIAGNOSIS — D500 Iron deficiency anemia secondary to blood loss (chronic): Secondary | ICD-10-CM | POA: Diagnosis not present

## 2019-12-08 LAB — IRON AND TIBC
Iron: 61 ug/dL (ref 45–182)
Saturation Ratios: 18 % (ref 17.9–39.5)
TIBC: 336 ug/dL (ref 250–450)
UIBC: 275 ug/dL

## 2019-12-08 LAB — CBC WITH DIFFERENTIAL/PLATELET
Abs Immature Granulocytes: 0.01 10*3/uL (ref 0.00–0.07)
Basophils Absolute: 0 10*3/uL (ref 0.0–0.1)
Basophils Relative: 0 %
Eosinophils Absolute: 0.1 10*3/uL (ref 0.0–0.5)
Eosinophils Relative: 1 %
HCT: 41.5 % (ref 39.0–52.0)
Hemoglobin: 13.9 g/dL (ref 13.0–17.0)
Immature Granulocytes: 0 %
Lymphocytes Relative: 31 %
Lymphs Abs: 1.9 10*3/uL (ref 0.7–4.0)
MCH: 31.2 pg (ref 26.0–34.0)
MCHC: 33.5 g/dL (ref 30.0–36.0)
MCV: 93.3 fL (ref 80.0–100.0)
Monocytes Absolute: 0.6 10*3/uL (ref 0.1–1.0)
Monocytes Relative: 9 %
Neutro Abs: 3.7 10*3/uL (ref 1.7–7.7)
Neutrophils Relative %: 59 %
Platelets: 219 10*3/uL (ref 150–400)
RBC: 4.45 MIL/uL (ref 4.22–5.81)
RDW: 14 % (ref 11.5–15.5)
WBC: 6.2 10*3/uL (ref 4.0–10.5)
nRBC: 0 % (ref 0.0–0.2)

## 2019-12-08 LAB — FERRITIN: Ferritin: 14 ng/mL — ABNORMAL LOW (ref 24–336)

## 2019-12-08 NOTE — Progress Notes (Signed)
Patient is feeling fatigued. 

## 2019-12-08 NOTE — Progress Notes (Signed)
[facility]  Follow Up Visit:  Patient Care Team: Baxter Hire, MD as PCP - General (Internal Medicine) Rockey Situ [name], MD as PCP - Cardiology (Cardiology) Madelyn Brunner, MD (Internal Medicine) Alisa Graff, FNP as Nurse Practitioner (Cardiology) Minna Merritts, MD as Consulting Physician (Cardiology)  REASON FOR VISIT Follow up for treatment of iron deficiency anemia  HISTORY OF PRESENTING ILLNESS: Scott [name] 79 y.o. male with PMH listed as below who is referred to me for evaluation and management of pulmonary embolism.  Scott [name] has a history paroxymal atrial fibrillation on Eliquis 5mg  BID until recently, history of CAD s/p CABG x 2 at [facility] in 2016 with chronic chest pain, s/p removal of sternal wire on 03/2016,  s/p sternal debridement and plating on 05/25/2016 in the setting of CT chest performed by pain management showing sternal nonunion of the manubrium with subsequent improvement in his pain. Perioperatively his anticoagulation with Eliquis was discontinue a few days prior surgery and resumed after surgery. Operation note mentioned that during the procedure, the left lung was densely adherent to the underside of the sternum and tear in the lung did occur and was sutured. He presents  to [facility] on 08/04/2016 for evaluation of SOB and was found to have a PE. Patient states that he was compliant with Eliquis.  CTA chest showed left upper lobe PE extending into the left main pulmonary artery. His Eliquis was changed to Xarelto. Lower extremity dopplers were negative for DVT. Hypercoagulable workup was sent and all tests were negative except a mild low protein S function.  No formal cancer workup.  Sed rate normal, heavy metals negative, TSH mildly reduced at 0.253. 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     Hypercoagulable work up: discussed with patient. He is tested negative  for factor 5 leiden mutation, prothrombin mutation, anti phospholipin antibody panels. Normal protein C activity. Normal total protein S, mildly low protein S function, can be falsely low in the acute setting of thrombosis.Congential protein S deficiency is unlikely given that he has no prior history of VTE. Repeat protein S total and free were normal.   # He has had one dose of Feraheme and during the treatment he had a chest tightness and end of infusion. Has received IV iron Venofer and tolerates well.   #chronic intermittent chest pain and shortness of breath that has been previously extensively worked up.  He follows up with Dr. Nyoka Cowden for his heart care.  Extensive cardiology disease CAD status post prior RCA and LAD stenting as well as CABG x2 chronic chest pain status post sternal wire removal with sternal debridement and plating, ischemia cardiomyopathy, chronic dyspnea.  # CT angios chest PE protocol done on 11/07/2016 showed no lung nodules or mass.  INTERVAL HISTORY Patient presents for follow-up of chronic anticoagulation, iron deficiency present for follow up .  Patient reports chest pain and feels fatigued. He reports that now he has left chest pain, intermittent.  On  Eliquis 2.5mg  BID for history of pulmonary embolism.  Denies hematochezia, hematuria, hematemesis, epistaxis, black tarry stool     Review of Systems  Constitutional: Positive for fatigue. Negative for appetite change, chills, fever and unexpected weight change.  HENT:   Negative for hearing loss and voice change.   Eyes: Negative for eye problems and icterus.  Respiratory: Positive for shortness of breath. Negative for chest  tightness and cough.        Intermittent chest pain.   Cardiovascular: Negative for chest pain and leg swelling.  Gastrointestinal: Negative for abdominal distention and abdominal pain.  Endocrine: Negative for hot flashes.  Genitourinary: Negative for difficulty urinating, dysuria and  frequency.   Musculoskeletal: Negative for arthralgias.  Skin: Negative for itching and rash.  Neurological: Negative for light-headedness and numbness.  Hematological: Negative for adenopathy. Does not bruise/bleed easily.  Psychiatric/Behavioral: Negative for confusion.    MEDICAL HISTORY: Past Medical History:  Diagnosis Date  . Arthritis    right hip, since a fall  . CAD (coronary artery disease)    a. 04/2012 Cath: 3VD->Med Rx;  b. 04/2013 PCI RCA (2 DES); c. 03/2014 PCI: LAD 80 (3.0x23 Xience Alpine DES); d. 06/2014 CABG x 2 ([facility]) LIMA->LAD, VG->OM; e. 12/2014 Cath ([facility]): patent grafts->Med Rx; f. 08/2015 Cath: patent grafts; g. 11/2016 Cath: LM min irregs, LAD 20p, 74m, LCX 100p/m, RCA 20ost, 10p/m/d, VG->OM1 nl, LIMA->dLAD nl; f. 07/2017 MV: No isch, EF 51%.  . Cardiomyopathy, ischemic    a. 04/2012 Echo: EF 40-45%;  b. 08/2013 Echo: EF 45-50%; c. 01/2015 Echo: EF 40-45%; d. 07/2016 Echo: EF 60-65%; e. 09/2017 Echo: EF 55-60%, Gr1 DD.  . Carotid arterial disease ([place])    a. 05/2013 Carotid U/S; bilat 40-50% ICA stenosis.  . Chronic Chest Pain    USES NITRO  . Chronic systolic CHF (congestive heart failure) ([place])    a. 01/2015 Echo: EF 40-45%; b. 07/2016 Echo: EF 60-65%; c. 09/2017 Echo: EF 55-60%, Gr1 DD, nl RV fxn.  . Cough   . Diverticulosis   . Dizziness   . Dyspnea    WITH EXERTION  . Headache 1970's   migraine  . History of blood transfusion 2016   post op  . Hyperlipidemia   . Hypertension   . Iron deficiency anemia due to chronic blood loss 09/11/2016  . Myocardial infarction [facility])    unsure of when  . Pulmonary emboli ([place]) 07/2016   a. On Xarelto.  . Reflux esophagitis   . Sternal pain    a. 03/2016 s/p Sternal wire removal; b. 05/2016 s/p redo median sternotomy for sternal debridement and sternal plating.  . Syncope 04/2018   CARDIOLOGIST WAS DR Rockey Situ AND HE WAS AWARE-NOW PT SEES PARASCHOS  . Tubular adenoma of colon     SURGICAL HISTORY: Past Surgical History:   Procedure Laterality Date  . CARDIAC CATHETERIZATION  05/01/2012  . CARDIAC CATHETERIZATION  04/2013   [facility];x3 stent  . CARDIAC CATHETERIZATION  01/11/15    [facility]  . CARDIAC CATHETERIZATION N/A 09/16/2015   Procedure: LEFT HEART CATH AND CORS/GRAFTS ANGIOGRAPHY;  Surgeon: Minna Merritts, MD;  Location: [place] CV LAB;  Service: Cardiovascular;  Laterality: N/A;  . CARPAL TUNNEL RELEASE     right hand  . CATARACT EXTRACTION     LEFT  . CATARACT EXTRACTION W/PHACO Left 09/16/2014   Procedure: CATARACT EXTRACTION PHACO AND INTRAOCULAR LENS PLACEMENT (IOC);  Surgeon: Leandrew Koyanagi, MD;  Location: [place];  Service: Ophthalmology;  Laterality: Left;  . COLONOSCOPY    . COLONOSCOPY WITH PROPOFOL N/A 01/28/2016   Procedure: COLONOSCOPY WITH PROPOFOL;  Surgeon: Lollie Sails, MD;  Location: [facility] ENDOSCOPY;  Service: Endoscopy;  Laterality: N/A;  . CORONARY ANGIOPLASTY WITH STENT PLACEMENT  04/13/2014  . CORONARY ARTERY BYPASS GRAFT  06-26-14   x3 bypasses  . CORONARY PRESSURE WIRE/FFR WITH 3D MAPPING N/A 12/10/2018   Procedure:  INTRAVASULAR PRESSURE WIRE/FFR STUDY;  Surgeon: Isaias Cowman, MD;  Location: [place] CV LAB;  Service: Cardiovascular;  Laterality: N/A;  . CYSTOSCOPY N/A 08/14/2018   Procedure: CYSTOSCOPY;  Surgeon: Abbie Sons, MD;  Location: [facility] ORS;  Service: Urology;  Laterality: N/A;  . DE QUERVAIN'S RELEASE Left 08/22/2012  . ESOPHAGOGASTRODUODENOSCOPY (EGD) WITH PROPOFOL N/A 04/26/2015   Procedure: ESOPHAGOGASTRODUODENOSCOPY (EGD) WITH PROPOFOL;  Surgeon: Hulen Luster, MD;  Location: [facility] ENDOSCOPY;  Service: Gastroenterology;  Laterality: N/A;  . ESOPHAGOGASTRODUODENOSCOPY (EGD) WITH PROPOFOL N/A 05/29/2017   Procedure: ESOPHAGOGASTRODUODENOSCOPY (EGD) WITH PROPOFOL;  Surgeon: Lollie Sails, MD;  Location: [facility] ENDOSCOPY;  Service: Endoscopy;  Laterality: N/A;  . INTRAVASCULAR ULTRASOUND/IVUS N/A 12/10/2018   Procedure: Intravascular  Ultrasound/IVUS;  Surgeon: Isaias Cowman, MD;  Location: [place] CV LAB;  Service: Cardiovascular;  Laterality: N/A;  . LEFT HEART CATH AND CORONARY ANGIOGRAPHY N/A 12/04/2016   Procedure: LEFT HEART CATH AND CORS/GRAFTS  ANGIOGRAPHY;  Surgeon: Wellington Hampshire, MD;  Location: [place] CV LAB;  Service: Cardiovascular;  Laterality: N/A;  . LEFT HEART CATH AND CORS/GRAFTS ANGIOGRAPHY N/A 04/09/2018   Procedure: LEFT HEART CATH AND CORS/GRAFTS ANGIOGRAPHY;  Surgeon: Isaias Cowman, MD;  Location: [place] CV LAB;  Service: Cardiovascular;  Laterality: N/A;  . LEFT HEART CATH AND CORS/GRAFTS ANGIOGRAPHY N/A 12/10/2018   Procedure: LEFT HEART CATH AND CORS/GRAFTS ANGIOGRAPHY;  Surgeon: Isaias Cowman, MD;  Location: [place] CV LAB;  Service: Cardiovascular;  Laterality: N/A;  . RIB PLATING N/A 05/25/2016   Procedure: STERNAL PLATING;  Surgeon: Melrose Nakayama, MD;  Location: [place];  Service: Thoracic;  Laterality: N/A;  . right shoulder    . STERNAL WIRES REMOVAL N/A 03/30/2016   Procedure: STERNAL WIRES REMOVAL;  Surgeon: Melrose Nakayama, MD;  Location: [place];  Service: Thoracic;  Laterality: N/A;  . TONSILLECTOMY      SOCIAL HISTORY: Social History   Socioeconomic History  . Marital status: Married    Spouse name: Not on file  . Number of children: Not on file  . Years of education: Not on file  . Highest education level: Not on file  Occupational History  . Not on file  Tobacco Use  . Smoking status: Former Smoker    Packs/day: 1.00    Years: 35.00    Pack years: 35.00    Types: Cigarettes    Start date: 05/02/2009  . Smokeless tobacco: Former Network engineer  . Vaping Use: Never used  Substance and Sexual Activity  . Alcohol use: No  . Drug use: No  . Sexual activity: Yes  Other Topics Concern  . Not on file  Social History Narrative  . Not on file   Social Determinants of Health   Financial Resource Strain:   . Difficulty  of Paying Living Expenses: Not on file  Food Insecurity:   . Worried About Charity fundraiser in the Last Year: Not on file  . Ran Out of Food in the Last Year: Not on file  Transportation Needs:   . Lack of Transportation (Medical): Not on file  . Lack of Transportation (Non-Medical): Not on file  Physical Activity:   . Days of Exercise per Week: Not on file  . Minutes of Exercise per Session: Not on file  Stress:   . Feeling of Stress : Not on file  Social Connections:   . Frequency of Communication with Friends and Family: Not on file  . Frequency of Social  Gatherings with Friends and Family: Not on file  . Attends Religious Services: Not on file  . Active Member of Clubs or Organizations: Not on file  . Attends Archivist Meetings: Not on file  . Marital Status: Not on file  Intimate Partner Violence:   . Fear of Current or Ex-Partner: Not on file  . Emotionally Abused: Not on file  . Physically Abused: Not on file  . Sexually Abused: Not on file    FAMILY HISTORY Family History  Problem Relation Age of Onset  . Heart attack Father 42       MI  . Cancer Maternal Uncle     ALLERGIES:  is allergic to contrast media [iodinated diagnostic agents], metrizamide, oxycodone hcl, and vicodin [hydrocodone-acetaminophen].  MEDICATIONS:  Current Outpatient Medications  Medication Sig Dispense Refill  . atorvastatin (LIPITOR) 80 MG tablet Take 1 tablet (80 mg total) by mouth daily at 6 PM. 30 tablet 0  . ELIQUIS 2.5 MG TABS tablet Take 2.5 mg by mouth 2 (two) times daily.    Marland Kitchen ezetimibe (ZETIA) 10 MG tablet Take 1 tablet (10 mg total) by mouth daily. 90 tablet 3  . isosorbide mononitrate (IMDUR) 60 MG 24 hr tablet Take 60 mg by mouth 2 (two) times daily.     . metoprolol succinate (TOPROL-XL) 25 MG 24 hr tablet Take 25 mg by mouth every evening.    . nitroGLYCERIN (NITROSTAT) 0.4 MG SL tablet Place 1 tablet (0.4 mg total) under the tongue every 5 (five) minutes as needed  for chest pain. 25 tablet 6  . ranolazine (RANEXA) 1000 MG SR tablet Take 1 tablet (1,000 mg total) by mouth 2 (two) times daily. 180 tablet 3  . albuterol (VENTOLIN HFA) 108 (90 Base) MCG/ACT inhaler Inhale 2 puffs into the lungs every 6 (six) hours as needed for wheezing or shortness of breath. (Patient not taking: Reported on 12/08/2019) 8 g 0  . ipratropium (ATROVENT) 0.06 % nasal spray Place 2 sprays into both nostrils 3 (three) times daily as needed for rhinitis or allergies. (Patient not taking: Reported on 12/08/2019)    . mometasone-formoterol (DULERA) 100-5 MCG/ACT AERO Inhale 2 puffs into the lungs 2 (two) times daily. (Patient not taking: Reported on 12/08/2019) 0.1 g 0   Current Facility-Administered Medications  Medication Dose Route Frequency Provider Last Rate Last Admin  . lidocaine (XYLOCAINE) 2 % jelly 1 application  1 application Urethral Once Stoioff, Scott C, MD        PHYSICAL EXAMINATION:  ECOG PERFORMANCE STATUS: 1 - Symptomatic but completely ambulatory   Vitals:   12/08/19 1334  BP: 112/66  Pulse: (!) 57  Resp: 16  Temp: (!) 96.8 F (36 C)  SpO2: 97%    Filed Weights   12/08/19 1334  Weight: 174 lb 14.4 oz (79.3 kg)     Physical Exam Constitutional:      General: He is not in acute distress.    Appearance: He is not diaphoretic.  HENT:     Head: Normocephalic and atraumatic.     Nose: Nose normal.     Mouth/Throat:     Pharynx: No oropharyngeal exudate.  Eyes:     General: No scleral icterus.    Conjunctiva/sclera: Conjunctivae normal.     Pupils: Pupils are equal, round, and reactive to light.  Cardiovascular:     Rate and Rhythm: Normal rate and regular rhythm.     Heart sounds: No murmur heard.   Pulmonary:  Effort: Pulmonary effort is normal. No respiratory distress.     Breath sounds: Normal breath sounds.  Abdominal:     General: Bowel sounds are normal. There is no distension.     Palpations: Abdomen is soft.     Tenderness:  There is no abdominal tenderness.  Musculoskeletal:        General: Normal range of motion.     Cervical back: Normal range of motion and neck supple.  Skin:    General: Skin is warm and dry.     Findings: No erythema.  Neurological:     Mental Status: He is alert and oriented to person, place, and time.     Cranial Nerves: No cranial nerve deficit.     Motor: No abnormal muscle tone.     Coordination: Coordination normal.  Psychiatric:        Mood and Affect: Affect normal.         LABORATORY DATA: I have personally reviewed the data as listed:  Appointment on 12/08/2019  Component Date Value Ref Range Status  . Ferritin 12/08/2019 14* 24 - 336 ng/mL Final   Performed at [facility], [address] [place], [place] [address]  . WBC 12/08/2019 6.2  4.0 - 10.5 K/uL Final  . RBC 12/08/2019 4.45  4.22 - 5.81 MIL/uL Final  . Hemoglobin 12/08/2019 13.9  13.0 - 17.0 g/dL Final  . HCT 12/08/2019 41.5  39 - 52 % Final  . MCV 12/08/2019 93.3  80.0 - 100.0 fL Final  . MCH 12/08/2019 31.2  26.0 - 34.0 pg Final  . MCHC 12/08/2019 33.5  30.0 - 36.0 g/dL Final  . RDW 12/08/2019 14.0  11.5 - 15.5 % Final  . Platelets 12/08/2019 219  150 - 400 K/uL Final  . nRBC 12/08/2019 0.0  0.0 - 0.2 % Final  . Neutrophils Relative % 12/08/2019 59  % Final  . Neutro Abs 12/08/2019 3.7  1.7 - 7.7 K/uL Final  . Lymphocytes Relative 12/08/2019 31  % Final  . Lymphs Abs 12/08/2019 1.9  0.7 - 4.0 K/uL Final  . Monocytes Relative 12/08/2019 9  % Final  . Monocytes Absolute 12/08/2019 0.6  0.1 - 1.0 K/uL Final  . Eosinophils Relative 12/08/2019 1  % Final  . Eosinophils Absolute 12/08/2019 0.1  0.0 - 0.5 K/uL Final  . Basophils Relative 12/08/2019 0  % Final  . Basophils Absolute 12/08/2019 0.0  0.0 - 0.1 K/uL Final  . Immature Granulocytes 12/08/2019 0  % Final  . Abs Immature Granulocytes 12/08/2019 0.01  0.00 - 0.07 K/uL Final   Performed at [facility], [address]  [place], [place] [address]  . Iron 12/08/2019 61  45 - 182 ug/dL Final  . TIBC 12/08/2019 336  250 - 450 ug/dL Final  . Saturation Ratios 12/08/2019 18  17.9 - 39.5 % Final  . UIBC 12/08/2019 275  ug/dL Final   Performed at [facility] [address] [place], [place] [address]   Lab Results  Component Value Date   IRON 61 12/08/2019   TIBC 336 12/08/2019   IRONPCTSAT 18 12/08/2019   Lab Results  Component Value Date   FERRITIN 14 (L) 12/08/2019    RADIOGRAPHIC STUDIES: I have personally reviewed the radiological images as listed and agree with the findings in the report No results found.   ASSESSMENT/PLAN 1. Iron deficiency anemia secondary to blood loss (chronic)   2. History of pulmonary embolism    #  History of iron deficiency anemia.  Labs are reviewed and discussed with patient. He has stable normal hemoglobin. Iron panel results were available after his visit.  Low ferritin of 14. He previously did not tolerate oral iron. Proceed with 1 dose of Venofer 200mg  x 1.   History of PE on chronic anticoagulation with Eliquis 2.5mg  BID.  Intermittent chest pain, recommend him to continue follow up with PCP and cardiology.   # Follow up with me in  1 year with  CBC, iron TIBC, ferritin repeat prior to the visit.   Earlie Server, MD, PhD Hematology Oncology [facility] Pager- [phone] 12/08/2019

## 2019-12-09 ENCOUNTER — Telehealth: Payer: Self-pay

## 2019-12-09 NOTE — Telephone Encounter (Signed)
Pt ok with proceeding with venofer. Pt prefers to follow up with PCP, since he follows up with them and has labs drawn every 6 month.

## 2019-12-09 NOTE — Telephone Encounter (Signed)
-----   Message from Earlie Server, MD sent at 12/08/2019  7:28 PM EST ----- Let him know that his iron level is slightly low. Please arrange him to get 1 dose of IV venofer, non urgent. Offer him to do follow up with labs prior and MD  in 1 year. Or continue to follow up with PCP and if Hb gets low come back. Thanks.

## 2019-12-09 NOTE — Telephone Encounter (Signed)
Done.. Pt was made aware of his sched 12/25/19 2p appt for Venofer

## 2019-12-25 ENCOUNTER — Ambulatory Visit: Payer: Medicare HMO

## 2019-12-29 ENCOUNTER — Ambulatory Visit: Payer: Medicare HMO

## 2020-01-02 ENCOUNTER — Other Ambulatory Visit: Payer: Self-pay

## 2020-01-02 ENCOUNTER — Inpatient Hospital Stay: Payer: Medicare HMO | Attending: Oncology

## 2020-01-02 ENCOUNTER — Telehealth: Payer: Self-pay

## 2020-01-02 VITALS — BP 128/73 | HR 80

## 2020-01-02 DIAGNOSIS — D500 Iron deficiency anemia secondary to blood loss (chronic): Secondary | ICD-10-CM

## 2020-01-02 DIAGNOSIS — D509 Iron deficiency anemia, unspecified: Secondary | ICD-10-CM | POA: Diagnosis not present

## 2020-01-02 MED ORDER — SODIUM CHLORIDE 0.9 % IV SOLN
Freq: Once | INTRAVENOUS | Status: AC
  Filled 2020-01-02: qty 250

## 2020-01-02 MED ORDER — IRON SUCROSE 20 MG/ML IV SOLN
200.0000 mg | Freq: Once | INTRAVENOUS | Status: AC
  Administered 2020-01-02: 200 mg via INTRAVENOUS
  Filled 2020-01-02: qty 10

## 2020-01-02 NOTE — Telephone Encounter (Signed)
Patient was here today for Iron infusion and is requesting a follow up app. Spoke to him in November and he had decided to follow up with PCP instead,, but today he told infusion RN Colletta Maryland he has changed his mind and would like to be followed here... states his energy is lower and feels Dr Tasia Catchings would follow more closely.

## 2020-01-05 NOTE — Telephone Encounter (Signed)
Done. patient has been sched as requested  And is aware of his sched appt for lab in 6 months (cbc,iron,ferr) and Md/venofer 1-2 days after  A NEW reminder letter will be mailed out

## 2020-05-06 ENCOUNTER — Telehealth: Payer: Self-pay | Admitting: *Deleted

## 2020-05-06 NOTE — Telephone Encounter (Signed)
Has he talked to his pcp yet? I see him for history of PE he is on eliquis.

## 2020-05-06 NOTE — Telephone Encounter (Signed)
Dr. Tasia Catchings, please advise.  Patient was last seen on 12/08/19 for follow-up of chronic anticoagulation, iron deficiency.

## 2020-05-06 NOTE — Telephone Encounter (Signed)
Patient called reporting that he is having pain and fullness under his left arm and soreness under his ribs. He states that this has been going on for "a while now" and would like to speak with Dr Tasia Catchings about this, not wanting to wait until his June 17 th appointment. Please advise

## 2020-05-07 NOTE — Telephone Encounter (Signed)
Call returned to patient and he states he has not called PCP about this recently, but had discussed this with PCP 2 months ago. He stated that he will contact his PCP today and thaked me for calling back

## 2020-05-10 ENCOUNTER — Other Ambulatory Visit
Admission: RE | Admit: 2020-05-10 | Discharge: 2020-05-10 | Disposition: A | Discharge status: Home / Self Care | Payer: Medicare HMO | Source: Ambulatory Visit | Attending: Cardiology | Admitting: Cardiology

## 2020-05-10 DIAGNOSIS — I5032 Chronic diastolic (congestive) heart failure: Secondary | ICD-10-CM | POA: Insufficient documentation

## 2020-05-10 DIAGNOSIS — I480 Paroxysmal atrial fibrillation: Secondary | ICD-10-CM | POA: Diagnosis present

## 2020-05-10 DIAGNOSIS — I2510 Atherosclerotic heart disease of native coronary artery without angina pectoris: Secondary | ICD-10-CM | POA: Diagnosis present

## 2020-05-10 DIAGNOSIS — I25118 Atherosclerotic heart disease of native coronary artery with other forms of angina pectoris: Secondary | ICD-10-CM | POA: Insufficient documentation

## 2020-05-10 LAB — BRAIN NATRIURETIC PEPTIDE: B Natriuretic Peptide: 95 pg/mL (ref 0.0–100.0)

## 2020-05-12 ENCOUNTER — Other Ambulatory Visit: Payer: Self-pay | Admitting: Internal Medicine

## 2020-05-12 DIAGNOSIS — N644 Mastodynia: Secondary | ICD-10-CM

## 2020-05-14 ENCOUNTER — Other Ambulatory Visit
Admission: RE | Admit: 2020-05-14 | Discharge: 2020-05-14 | Disposition: A | Discharge status: Home / Self Care | Payer: Medicare HMO | Source: Ambulatory Visit | Attending: Cardiology | Admitting: Cardiology

## 2020-05-14 ENCOUNTER — Other Ambulatory Visit: Payer: Self-pay

## 2020-05-14 DIAGNOSIS — Z01812 Encounter for preprocedural laboratory examination: Secondary | ICD-10-CM | POA: Diagnosis present

## 2020-05-14 DIAGNOSIS — Z20822 Contact with and (suspected) exposure to covid-19: Secondary | ICD-10-CM | POA: Diagnosis not present

## 2020-05-14 LAB — SARS CORONAVIRUS 2 (TAT 6-24 HRS): SARS Coronavirus 2: NEGATIVE

## 2020-05-18 DIAGNOSIS — R079 Chest pain, unspecified: Secondary | ICD-10-CM

## 2020-05-20 ENCOUNTER — Other Ambulatory Visit: Payer: Self-pay

## 2020-05-20 ENCOUNTER — Ambulatory Visit
Admission: RE | Admit: 2020-05-20 | Discharge: 2020-05-20 | Disposition: A | Discharge status: Home / Self Care | Payer: Medicare HMO | Source: Ambulatory Visit | Attending: Internal Medicine | Admitting: Internal Medicine

## 2020-05-20 DIAGNOSIS — N644 Mastodynia: Secondary | ICD-10-CM

## 2020-05-21 ENCOUNTER — Other Ambulatory Visit
Admission: RE | Admit: 2020-05-21 | Discharge: 2020-05-21 | Disposition: A | Discharge status: Home / Self Care | Payer: Medicare HMO | Source: Ambulatory Visit | Attending: Cardiology | Admitting: Cardiology

## 2020-05-21 DIAGNOSIS — Z01812 Encounter for preprocedural laboratory examination: Secondary | ICD-10-CM | POA: Insufficient documentation

## 2020-05-21 DIAGNOSIS — Z20822 Contact with and (suspected) exposure to covid-19: Secondary | ICD-10-CM | POA: Diagnosis not present

## 2020-05-21 LAB — SARS CORONAVIRUS 2 (TAT 6-24 HRS): SARS Coronavirus 2: NEGATIVE

## 2020-05-25 ENCOUNTER — Encounter: Payer: Self-pay | Admitting: Cardiology

## 2020-05-25 ENCOUNTER — Ambulatory Visit
Admission: RE | Admit: 2020-05-25 | Discharge: 2020-05-25 | Disposition: A | Discharge status: Home / Self Care | Payer: Medicare HMO | Source: Ambulatory Visit | Attending: Cardiology | Admitting: Cardiology

## 2020-05-25 ENCOUNTER — Encounter
Admission: RE | Disposition: A | Discharge status: Home / Self Care | Payer: Self-pay | Source: Ambulatory Visit | Attending: Cardiology

## 2020-05-25 ENCOUNTER — Other Ambulatory Visit: Payer: Self-pay

## 2020-05-25 DIAGNOSIS — Z955 Presence of coronary angioplasty implant and graft: Secondary | ICD-10-CM | POA: Insufficient documentation

## 2020-05-25 DIAGNOSIS — R079 Chest pain, unspecified: Secondary | ICD-10-CM | POA: Diagnosis present

## 2020-05-25 DIAGNOSIS — I2510 Atherosclerotic heart disease of native coronary artery without angina pectoris: Secondary | ICD-10-CM | POA: Diagnosis not present

## 2020-05-25 HISTORY — PX: LEFT HEART CATH AND CORS/GRAFTS ANGIOGRAPHY: CATH118250

## 2020-05-25 SURGERY — LEFT HEART CATH AND CORS/GRAFTS ANGIOGRAPHY
Anesthesia: Moderate Sedation | Laterality: Left

## 2020-05-25 MED ORDER — DIPHENHYDRAMINE HCL 50 MG/ML IJ SOLN: 25.0000 mg | Freq: Once | INTRAMUSCULAR | Status: DC

## 2020-05-25 MED ORDER — METHYLPREDNISOLONE SODIUM SUCC 125 MG IJ SOLR
INTRAMUSCULAR | Status: AC
  Filled 2020-05-25: qty 2

## 2020-05-25 MED ORDER — IOHEXOL 300 MG/ML  SOLN
INTRAMUSCULAR | Status: DC | PRN
  Administered 2020-05-25: 149 mL

## 2020-05-25 MED ORDER — DIPHENHYDRAMINE HCL 50 MG/ML IJ SOLN
INTRAMUSCULAR | Status: DC | PRN
  Administered 2020-05-25: 25 mg via INTRAVENOUS

## 2020-05-25 MED ORDER — SODIUM CHLORIDE 0.9% FLUSH: 3.0000 mL | Freq: Two times a day (BID) | INTRAVENOUS | Status: DC

## 2020-05-25 MED ORDER — HEPARIN (PORCINE) IN NACL 1000-0.9 UT/500ML-% IV SOLN
INTRAVENOUS | Status: DC | PRN
  Administered 2020-05-25 (×2): 500 mL

## 2020-05-25 MED ORDER — DIPHENHYDRAMINE HCL 50 MG/ML IJ SOLN
INTRAMUSCULAR | Status: AC
  Filled 2020-05-25: qty 1

## 2020-05-25 MED ORDER — SODIUM CHLORIDE 0.9 % IV SOLN: 250.0000 mL | INTRAVENOUS | Status: DC | PRN

## 2020-05-25 MED ORDER — ONDANSETRON HCL 4 MG/2ML IJ SOLN: 4.0000 mg | Freq: Four times a day (QID) | INTRAMUSCULAR | Status: DC | PRN

## 2020-05-25 MED ORDER — FENTANYL CITRATE (PF) 100 MCG/2ML IJ SOLN
INTRAMUSCULAR | Status: AC
  Filled 2020-05-25: qty 2

## 2020-05-25 MED ORDER — LABETALOL HCL 5 MG/ML IV SOLN: 10.0000 mg | INTRAVENOUS | Status: DC | PRN

## 2020-05-25 MED ORDER — SODIUM CHLORIDE 0.9 % WEIGHT BASED INFUSION
3.0000 mL/kg/h | INTRAVENOUS | Status: AC
  Administered 2020-05-25: 3 mL/kg/h via INTRAVENOUS

## 2020-05-25 MED ORDER — METHYLPREDNISOLONE SODIUM SUCC 125 MG IJ SOLR: 125.0000 mg | Freq: Once | INTRAMUSCULAR | Status: DC

## 2020-05-25 MED ORDER — HYDRALAZINE HCL 20 MG/ML IJ SOLN: 10.0000 mg | INTRAMUSCULAR | Status: DC | PRN

## 2020-05-25 MED ORDER — METHYLPREDNISOLONE SODIUM SUCC 125 MG IJ SOLR
INTRAMUSCULAR | Status: DC | PRN
  Administered 2020-05-25: 125 mg via INTRAVENOUS

## 2020-05-25 MED ORDER — ASPIRIN 81 MG PO CHEW
81.0000 mg | CHEWABLE_TABLET | ORAL | Status: DC
Start: 2020-05-26 — End: 2020-05-25

## 2020-05-25 MED ORDER — HEPARIN (PORCINE) IN NACL 1000-0.9 UT/500ML-% IV SOLN
INTRAVENOUS | Status: AC
  Filled 2020-05-25: qty 1000

## 2020-05-25 MED ORDER — SODIUM CHLORIDE 0.9% FLUSH: 3.0000 mL | INTRAVENOUS | Status: DC | PRN

## 2020-05-25 MED ORDER — SODIUM CHLORIDE 0.9 % WEIGHT BASED INFUSION: 1.0000 mL/kg/h | INTRAVENOUS | Status: DC

## 2020-05-25 MED ORDER — FENTANYL CITRATE (PF) 100 MCG/2ML IJ SOLN
INTRAMUSCULAR | Status: DC | PRN
  Administered 2020-05-25: 25 ug via INTRAVENOUS

## 2020-05-25 MED ORDER — MIDAZOLAM HCL 2 MG/2ML IJ SOLN
INTRAMUSCULAR | Status: AC
  Filled 2020-05-25: qty 2

## 2020-05-25 MED ORDER — MIDAZOLAM HCL 2 MG/2ML IJ SOLN
INTRAMUSCULAR | Status: DC | PRN
  Administered 2020-05-25: 1 mg via INTRAVENOUS

## 2020-05-25 SURGICAL SUPPLY — 11 items
CATH INFINITI 5 FR 3DRC (CATHETERS) ×2 IMPLANT
CATH INFINITI 5FR ANG PIGTAIL (CATHETERS) ×2 IMPLANT
CATH INFINITI 5FR JL4 (CATHETERS) ×2 IMPLANT
CATH INFINITI JR4 5F (CATHETERS) ×2 IMPLANT
DEVICE CLOSURE MYNXGRIP 5F (Vascular Products) ×2 IMPLANT
KIT SYRINGE INJ CVI SPIKEX1 (MISCELLANEOUS) ×2 IMPLANT
NEEDLE PERC 18GX7CM (NEEDLE) ×2 IMPLANT
PACK CARDIAC CATH (CUSTOM PROCEDURE TRAY) ×2 IMPLANT
SET ATX SIMPLICITY (MISCELLANEOUS) ×2 IMPLANT
SHEATH AVANTI 5FR X 11CM (SHEATH) ×2 IMPLANT
WIRE GUIDERIGHT .035X150 (WIRE) ×2 IMPLANT

## 2020-05-25 NOTE — Discharge Instructions (Signed)
Resume Eliquis on 05/26/2020.

## 2020-06-08 IMAGING — MR MR HEAD W/O CM
12 series · 48 of 48 positions shown · non-contrast
Comparison: MRI head 11/20/2016

CLINICAL DATA: Neuro deficit.  Rule out stroke.

EXAM:
MRI HEAD WITHOUT CONTRAST
TECHNIQUE: Multiplanar, multiecho pulse sequences of the brain and surrounding
structures were obtained without intravenous contrast.

[Series 5: ax dwi_tracew · axial · 3.0mm · 0.83mm/px · z∈[-123,+26]mm · 4 of 52 slices shown]
[im 1/52]
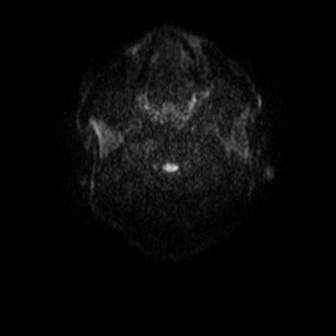
[im 18/52]
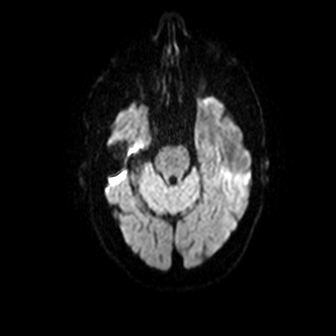
[im 35/52]
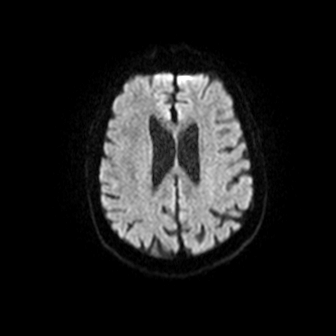
[im 52/52]
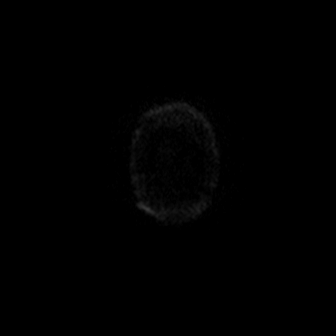

[Series 6: ax dwi_adc · axial · 3.0mm · 0.83mm/px · z∈[-123,+26]mm · 4 of 52 slices shown]
[im 1/52]
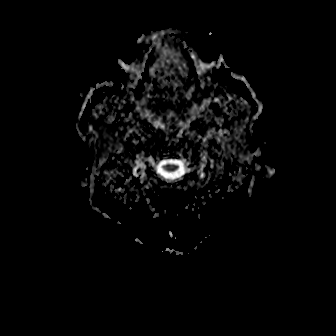
[im 18/52]
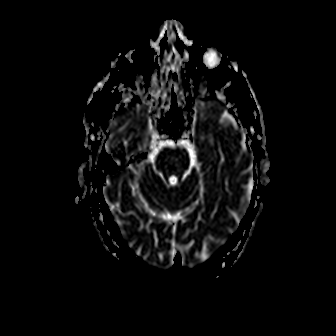
[im 35/52]
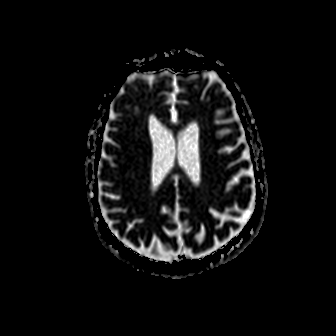
[im 52/52]
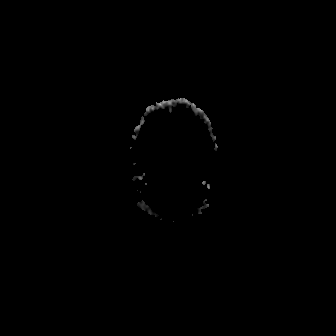

[Series 7: cor dwi_tracew · coronal · 5.0mm · 0.68mm/px · 3 of 38 slices shown]
[im 1/38]
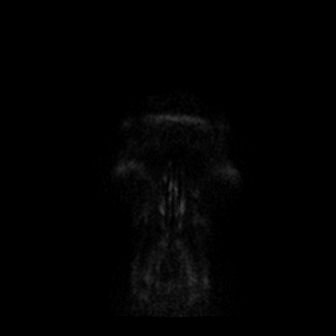
[im 19/38]
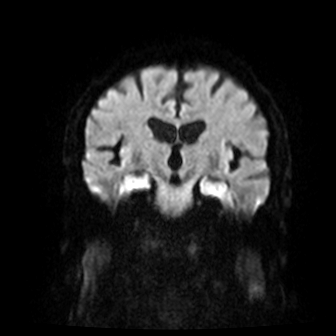
[im 38/38]
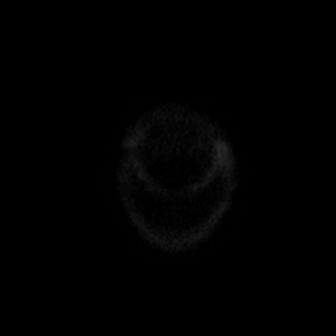

[Series 8: cor dwi_adc · coronal · 5.0mm · 0.68mm/px · 3 of 38 slices shown]
[im 1/38]
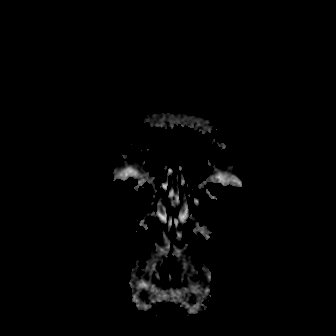
[im 19/38]
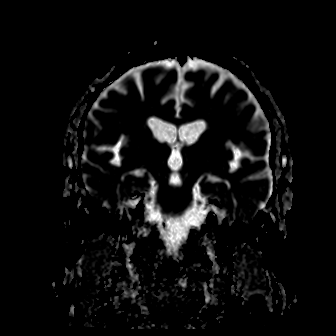
[im 38/38]
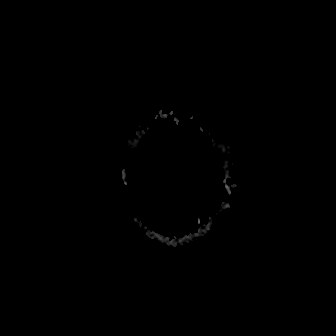

[Series 9: T1 · sagittal · 5.0mm · 0.62mm/px · 2 of 23 slices shown (1 of 2)]
[im 1/23]
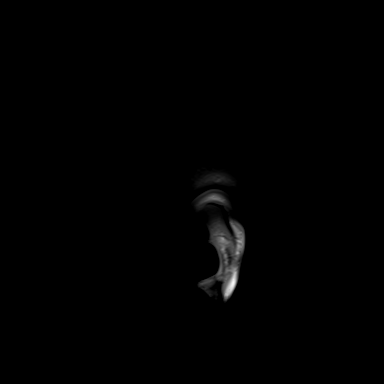
[im 23/23]
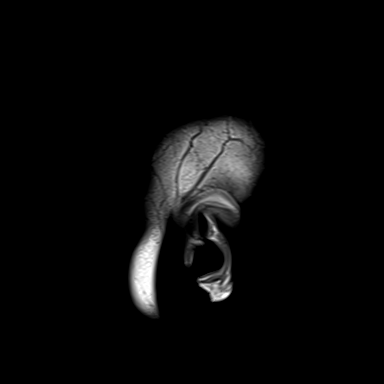

[Series 10: T2 · axial · 5.0mm · 0.53mm/px · z∈[-95,+47]mm · 2 of 25 slices shown (1 of 2)]
[im 1/25]
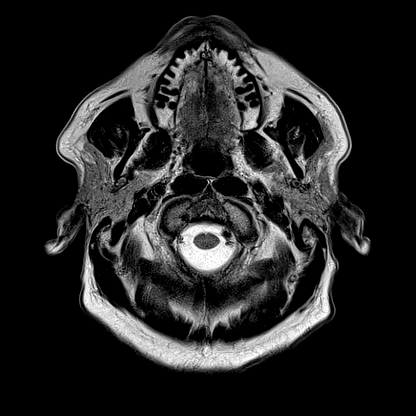
[im 25/25]
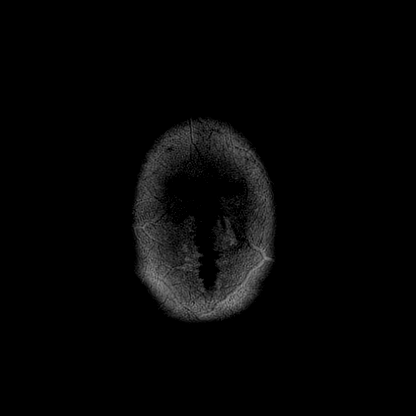

[Series 11: mag_images · axial · 3.0mm · 0.90mm/px · z∈[-113,+62]mm · 4 of 60 slices shown]
[im 1/60]
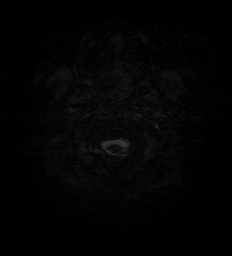
[im 20/60]
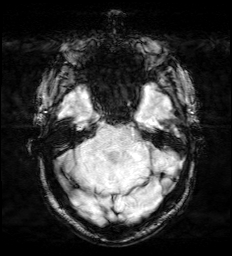
[im 40/60]
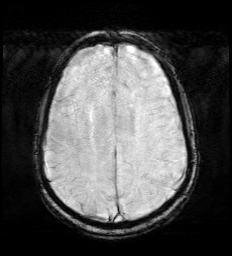
[im 60/60]
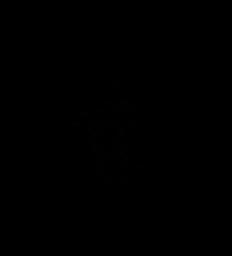

[Series 12: pha_images · axial · 3.0mm · 0.90mm/px · z∈[-113,+62]mm · 4 of 59 slices shown]
[im 1/59]
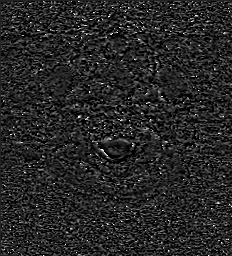
[im 20/59]
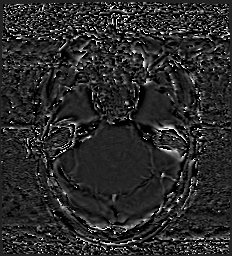
[im 39/59]
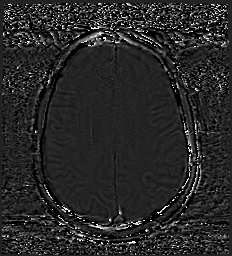
[im 59/59]
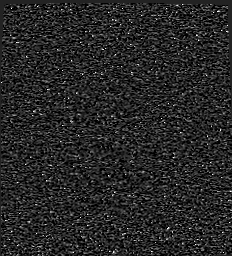

[Series 13: swi_images · axial · 3.0mm · 0.90mm/px · z∈[-113,+62]mm · 4 of 60 slices shown]
[im 1/60]
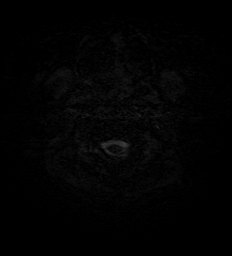
[im 20/60]
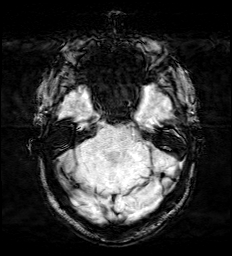
[im 40/60]
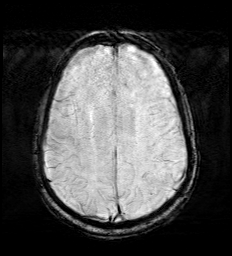
[im 60/60]
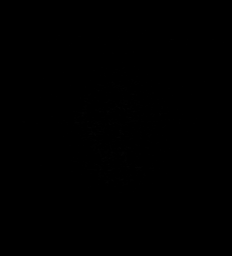

[Series 15: FLAIR · axial · 3.0mm · 0.53mm/px · z∈[-104,+56]mm · 4 of 55 slices shown]
[im 1/55]
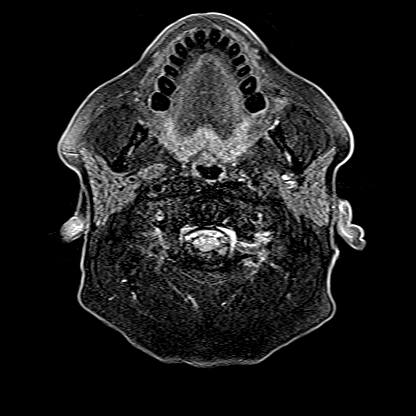
[im 19/55]
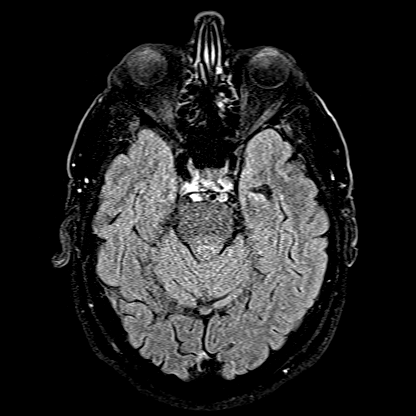
[im 37/55]
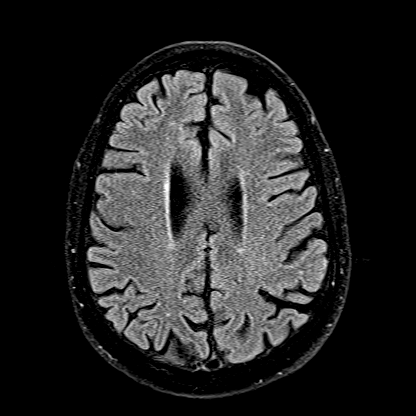
[im 55/55]
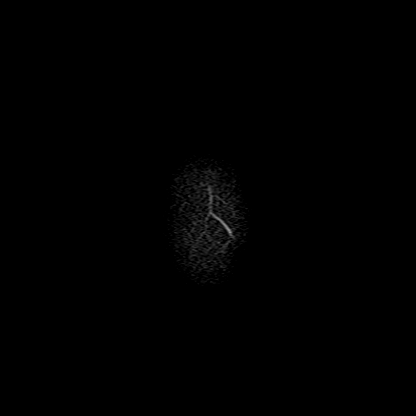

[Series 16: T1 · axial · 1.0mm · 0.98mm/px · z∈[-107,+50]mm · 12 of 160 slices shown (2 of 2)]
[im 1/160]
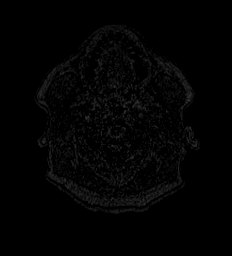
[im 15/160]
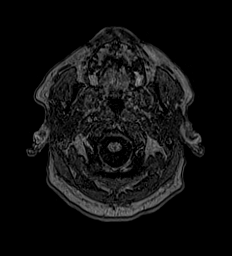
[im 29/160]
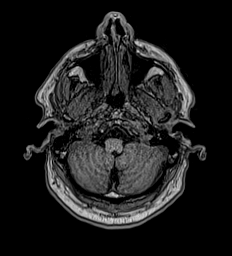
[im 44/160]
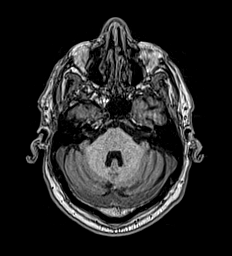
[im 58/160]
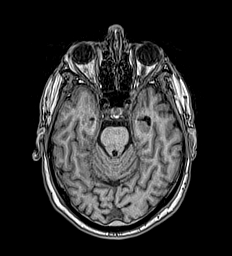
[im 73/160]
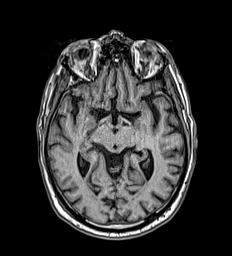
[im 87/160]
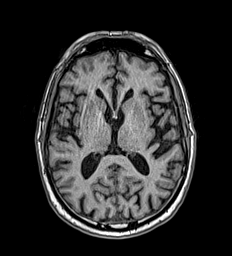
[im 102/160]
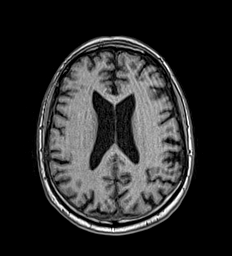
[im 116/160]
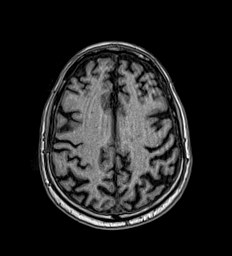
[im 131/160]
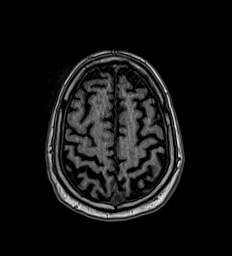
[im 145/160]
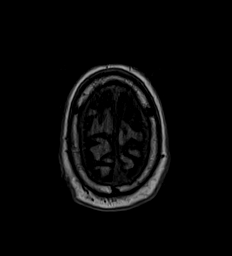
[im 160/160]
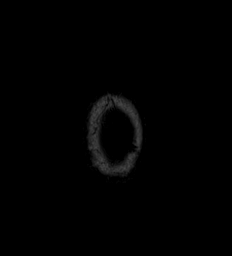

[Series 17: T2 · coronal · 5.0mm · 0.57mm/px · 2 of 29 slices shown (2 of 2)]
[im 1/29]
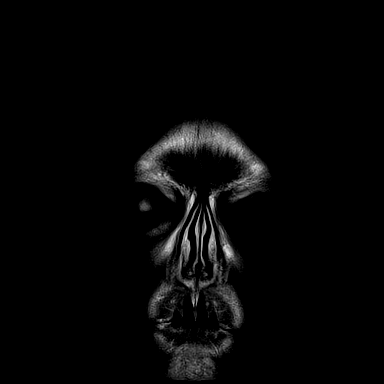
[im 29/29]
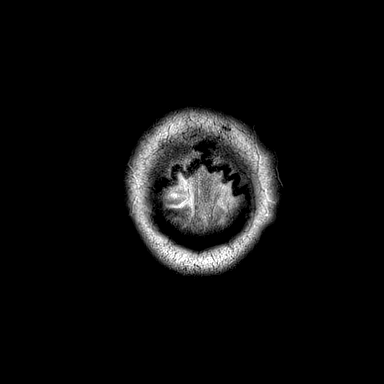

[48 of 48 positions shown; findings below may reference images not displayed]

FINDINGS: Brain: Negative for acute infarct. Few small white matter
hyperintensities bilaterally unchanged from the prior study. Chronic
microhemorrhage left cerebellum. Negative for mass or edema.

Vascular: Normal arterial flow voids. Hypoplastic distal right
vertebral artery unchanged.

Skull and upper cervical spine: No focal skeletal lesion.

Sinuses/Orbits: Mild mucosal edema paranasal sinuses. Bilateral
cataract surgery

Other: None
IMPRESSION: Negative for acute infarct. Mild chronic microvascular ischemic
changes in the white matter.

## 2020-06-28 ENCOUNTER — Other Ambulatory Visit: Payer: Medicare HMO

## 2020-06-30 ENCOUNTER — Ambulatory Visit: Payer: Medicare HMO

## 2020-06-30 ENCOUNTER — Ambulatory Visit: Payer: Medicare HMO | Admitting: Oncology

## 2020-07-07 ENCOUNTER — Inpatient Hospital Stay: Payer: Medicare HMO | Attending: Oncology

## 2020-07-07 ENCOUNTER — Other Ambulatory Visit: Payer: Self-pay

## 2020-07-07 DIAGNOSIS — D509 Iron deficiency anemia, unspecified: Secondary | ICD-10-CM | POA: Diagnosis present

## 2020-07-07 DIAGNOSIS — Z86711 Personal history of pulmonary embolism: Secondary | ICD-10-CM | POA: Diagnosis not present

## 2020-07-07 DIAGNOSIS — Z7901 Long term (current) use of anticoagulants: Secondary | ICD-10-CM | POA: Insufficient documentation

## 2020-07-07 DIAGNOSIS — D500 Iron deficiency anemia secondary to blood loss (chronic): Secondary | ICD-10-CM

## 2020-07-07 LAB — CBC WITH DIFFERENTIAL/PLATELET
Abs Immature Granulocytes: 0.01 10*3/uL (ref 0.00–0.07)
Basophils Absolute: 0 10*3/uL (ref 0.0–0.1)
Basophils Relative: 0 %
Eosinophils Absolute: 0.1 10*3/uL (ref 0.0–0.5)
Eosinophils Relative: 1 %
HCT: 42.7 % (ref 39.0–52.0)
Hemoglobin: 14.4 g/dL (ref 13.0–17.0)
Immature Granulocytes: 0 %
Lymphocytes Relative: 35 %
Lymphs Abs: 1.9 10*3/uL (ref 0.7–4.0)
MCH: 31.4 pg (ref 26.0–34.0)
MCHC: 33.7 g/dL (ref 30.0–36.0)
MCV: 93 fL (ref 80.0–100.0)
Monocytes Absolute: 0.6 10*3/uL (ref 0.1–1.0)
Monocytes Relative: 11 %
Neutro Abs: 2.8 10*3/uL (ref 1.7–7.7)
Neutrophils Relative %: 53 %
Platelets: 185 10*3/uL (ref 150–400)
RBC: 4.59 MIL/uL (ref 4.22–5.81)
RDW: 14.8 % (ref 11.5–15.5)
WBC: 5.4 10*3/uL (ref 4.0–10.5)
nRBC: 0 % (ref 0.0–0.2)

## 2020-07-07 LAB — IRON AND TIBC
Iron: 78 ug/dL (ref 45–182)
Saturation Ratios: 22 % (ref 17.9–39.5)
TIBC: 351 ug/dL (ref 250–450)
UIBC: 273 ug/dL

## 2020-07-07 LAB — FERRITIN: Ferritin: 12 ng/mL — ABNORMAL LOW (ref 24–336)

## 2020-07-09 ENCOUNTER — Inpatient Hospital Stay: Payer: Medicare HMO | Admitting: Nurse Practitioner

## 2020-07-09 ENCOUNTER — Encounter: Payer: Self-pay | Admitting: Nurse Practitioner

## 2020-07-09 ENCOUNTER — Other Ambulatory Visit: Payer: Self-pay

## 2020-07-09 ENCOUNTER — Inpatient Hospital Stay: Payer: Medicare HMO

## 2020-07-09 VITALS — BP 108/64 | HR 45 | Temp 97.0°F | Resp 18

## 2020-07-09 VITALS — BP 109/54 | HR 51 | Temp 98.0°F | Resp 20 | Wt 175.0 lb

## 2020-07-09 DIAGNOSIS — Z86711 Personal history of pulmonary embolism: Secondary | ICD-10-CM

## 2020-07-09 DIAGNOSIS — D509 Iron deficiency anemia, unspecified: Secondary | ICD-10-CM | POA: Diagnosis not present

## 2020-07-09 DIAGNOSIS — D500 Iron deficiency anemia secondary to blood loss (chronic): Secondary | ICD-10-CM

## 2020-07-09 DIAGNOSIS — Z87891 Personal history of nicotine dependence: Secondary | ICD-10-CM | POA: Diagnosis not present

## 2020-07-09 DIAGNOSIS — Z7901 Long term (current) use of anticoagulants: Secondary | ICD-10-CM | POA: Diagnosis not present

## 2020-07-09 MED ORDER — SODIUM CHLORIDE 0.9 % IV SOLN
Freq: Once | INTRAVENOUS | Status: AC
Start: 2020-07-09 — End: 2020-07-09
  Filled 2020-07-09: qty 250

## 2020-07-09 MED ORDER — IRON SUCROSE 20 MG/ML IV SOLN
200.0000 mg | Freq: Once | INTRAVENOUS | Status: AC
  Administered 2020-07-09: 200 mg via INTRAVENOUS
  Filled 2020-07-09: qty 10

## 2020-07-09 NOTE — Patient Instructions (Signed)

## 2020-07-09 NOTE — Progress Notes (Signed)
Patient here today for follow up regarding anemia. Patient reports shortness of breath, chest tightness and tightness and under left arm.

## 2020-07-09 NOTE — Progress Notes (Signed)
[facility] Follow Up Visit  Patient Care Team: Baxter Hire, MD as PCP - General (Internal Medicine) Rockey Situ [name], MD as PCP - Cardiology (Cardiology) Madelyn Brunner, MD (Internal Medicine) Alisa Graff, FNP as Nurse Practitioner (Cardiology) Minna Merritts, MD as Consulting Physician (Cardiology)  REASON FOR VISIT Follow up for treatment of iron deficiency anemia  HISTORY OF PRESENTING ILLNESS: Scott [name] 80 y.o. male with PMH listed as below who is referred to me for evaluation and management of pulmonary embolism.  Scott [name] has a history paroxymal atrial fibrillation on Eliquis 5mg  BID until recently, history of CAD s/p CABG x 2 at [facility] in 2016 with chronic chest pain, s/p removal of sternal wire on 03/2016,  s/p sternal debridement and plating on 05/25/2016 in the setting of CT chest performed by pain management showing sternal nonunion of the manubrium with subsequent improvement in his pain. Perioperatively his anticoagulation with Eliquis was discontinue a few days prior surgery and resumed after surgery. Operation note mentioned that during the procedure, the left lung was densely adherent to the underside of the sternum and tear in the lung did occur and was sutured. He presents  to [facility] on 08/04/2016 for evaluation of SOB and was found to have a PE. Patient states that he was compliant with Eliquis.  CTA chest showed left upper lobe PE extending into the left main pulmonary artery. His Eliquis was changed to Xarelto. Lower extremity dopplers were negative for DVT. Hypercoagulable workup was sent and all tests were negative except a mild low protein S function.  No formal cancer workup.  Sed rate normal, heavy metals negative, TSH mildly reduced at 0.253. 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     Hypercoagulable work up: discussed with patient. He is tested  negative for factor 5 leiden mutation, prothrombin mutation, anti phospholipin antibody panels. Normal protein C activity. Normal total protein S, mildly low protein S function, can be falsely low in the acute setting of thrombosis.Congential protein S deficiency is unlikely given that he has no prior history of VTE. Repeat protein S total and free were normal.   # He has had one dose of Feraheme and during the treatment he had a chest tightness and end of infusion. Has received IV iron Venofer and tolerates well.   #chronic intermittent chest pain and shortness of breath that has been previously extensively worked up.  He follows up with Dr. Nyoka Cowden for his heart care.  Extensive cardiology disease CAD status post prior RCA and LAD stenting as well as CABG x2 chronic chest pain status post sternal wire removal with sternal debridement and plating, ischemia cardiomyopathy, chronic dyspnea.  # CT angios chest PE protocol done on 11/07/2016 showed no lung nodules or mass.  INTERVAL HISTORY: Patient is 80 year old male who returns to clinic for follow-up for chronic anticoagulation and iron deficiency.  He reports left-sided chest tightness and intermittent shortness of breath.  Chronic fatigue.  He continues Eliquis for history of pulmonary embolism.  He is concerned about lung cancer given his previous history of smoking and exposure to dust and chemicals.  Had a CT last year that was negative.  Symptoms are roughly the same.  Has also discussed the symptoms with his PCP.  Intermittent black stools.  No hematuria dysuria, hematemesis, epistaxis, hematochezia.  Last colonoscopy in 2018 with recommendation to repeat in  5 years.  He also has a history of angina.   Review of Systems  Constitutional:  Positive for fatigue. Negative for appetite change, chills, fever and unexpected weight change.  HENT:   Negative for hearing loss and voice change.   Eyes:  Negative for eye problems and icterus.  Respiratory:   Positive for shortness of breath. Negative for chest tightness and cough.        Intermittent chest pain.   Cardiovascular:  Negative for chest pain and leg swelling.  Gastrointestinal:  Negative for abdominal distention and abdominal pain.  Endocrine: Negative for hot flashes.  Genitourinary:  Negative for difficulty urinating, dysuria and frequency.   Musculoskeletal:  Negative for arthralgias.  Skin:  Negative for itching and rash.  Neurological:  Negative for light-headedness and numbness.  Hematological:  Negative for adenopathy. Does not bruise/bleed easily.  Psychiatric/Behavioral:  Negative for confusion.    MEDICAL HISTORY: Past Medical History:  Diagnosis Date   Arthritis    right hip, since a fall   CAD (coronary artery disease)    a. 04/2012 Cath: 3VD->Med Rx;  b. 04/2013 PCI RCA (2 DES); c. 03/2014 PCI: LAD 80 (3.0x23 Xience Alpine DES); d. 06/2014 CABG x 2 ([facility]) LIMA->LAD, VG->OM; e. 12/2014 Cath ([facility]): patent grafts->Med Rx; f. 08/2015 Cath: patent grafts; g. 11/2016 Cath: LM min irregs, LAD 20p, 74m, LCX 100p/m, RCA 20ost, 10p/m/d, VG->OM1 nl, LIMA->dLAD nl; f. 07/2017 MV: No isch, EF 51%.   Cardiomyopathy, ischemic    a. 04/2012 Echo: EF 40-45%;  b. 08/2013 Echo: EF 45-50%; c. 01/2015 Echo: EF 40-45%; d. 07/2016 Echo: EF 60-65%; e. 09/2017 Echo: EF 55-60%, Gr1 DD.   Carotid arterial disease ([place])    a. 05/2013 Carotid U/S; bilat 40-50% ICA stenosis.   Chronic Chest Pain    USES NITRO   Chronic systolic CHF (congestive heart failure) ([place])    a. 01/2015 Echo: EF 40-45%; b. 07/2016 Echo: EF 60-65%; c. 09/2017 Echo: EF 55-60%, Gr1 DD, nl RV fxn.   Cough    Diverticulosis    Dizziness    Dyspnea    WITH EXERTION   Headache 1970's   migraine   History of blood transfusion 2016   post op   Hyperlipidemia    Hypertension    Iron deficiency anemia due to chronic blood loss 09/11/2016   Myocardial infarction [facility])    unsure of when   Pulmonary emboli ([place]) 07/2016   a. On  Xarelto.   Reflux esophagitis    Sternal pain    a. 03/2016 s/p Sternal wire removal; b. 05/2016 s/p redo median sternotomy for sternal debridement and sternal plating.   Syncope 04/2018   CARDIOLOGIST WAS DR Rockey Situ AND HE WAS AWARE-NOW PT SEES PARASCHOS   Tubular adenoma of colon     SURGICAL HISTORY: Past Surgical History:  Procedure Laterality Date   CARDIAC CATHETERIZATION  05/01/2012   CARDIAC CATHETERIZATION  04/2013   [facility];x3 stent   CARDIAC CATHETERIZATION  01/11/15    [facility]   CARDIAC CATHETERIZATION N/A 09/16/2015   Procedure: LEFT HEART CATH AND CORS/GRAFTS ANGIOGRAPHY;  Surgeon: Minna Merritts, MD;  Location: [place] [facility];  Service: Cardiovascular;  Laterality: N/A;   CARPAL TUNNEL RELEASE     right hand   CATARACT EXTRACTION     LEFT   CATARACT EXTRACTION W/PHACO Left 09/16/2014   Procedure: CATARACT EXTRACTION PHACO AND INTRAOCULAR LENS PLACEMENT (IOC);  Surgeon: Leandrew Koyanagi, MD;  Location: [place];  Service: Ophthalmology;  Laterality: Left;   COLONOSCOPY     COLONOSCOPY WITH PROPOFOL N/A 01/28/2016   Procedure: COLONOSCOPY WITH PROPOFOL;  Surgeon: Lollie Sails, MD;  Location: [facility] ENDOSCOPY;  Service: Endoscopy;  Laterality: N/A;   CORONARY ANGIOPLASTY WITH STENT PLACEMENT  04/13/2014   CORONARY ARTERY BYPASS GRAFT  06-26-14   x3 bypasses   CORONARY PRESSURE WIRE/FFR WITH 3D MAPPING N/A 12/10/2018   Procedure: Suzette Battiest PRESSURE WIRE/FFR STUDY;  Surgeon: Isaias Cowman, MD;  Location: [place] [facility];  Service: Cardiovascular;  Laterality: N/A;   CYSTOSCOPY N/A 08/14/2018   Procedure: CYSTOSCOPY;  Surgeon: Abbie Sons, MD;  Location: [facility];  Service: Urology;  Laterality: N/A;   DE QUERVAIN'S RELEASE Left 08/22/2012   ESOPHAGOGASTRODUODENOSCOPY (EGD) WITH PROPOFOL N/A 04/26/2015   Procedure: ESOPHAGOGASTRODUODENOSCOPY (EGD) WITH PROPOFOL;  Surgeon: Hulen Luster, MD;  Location: [facility] ENDOSCOPY;  Service: Gastroenterology;   Laterality: N/A;   ESOPHAGOGASTRODUODENOSCOPY (EGD) WITH PROPOFOL N/A 05/29/2017   Procedure: ESOPHAGOGASTRODUODENOSCOPY (EGD) WITH PROPOFOL;  Surgeon: Lollie Sails, MD;  Location: [facility] ENDOSCOPY;  Service: Endoscopy;  Laterality: N/A;   INTRAVASCULAR ULTRASOUND/IVUS N/A 12/10/2018   Procedure: Intravascular Ultrasound/IVUS;  Surgeon: Isaias Cowman, MD;  Location: [place] [facility];  Service: Cardiovascular;  Laterality: N/A;   LEFT HEART CATH AND CORONARY ANGIOGRAPHY N/A 12/04/2016   Procedure: LEFT HEART CATH AND CORS/GRAFTS  ANGIOGRAPHY;  Surgeon: Wellington Hampshire, MD;  Location: [place] [facility];  Service: Cardiovascular;  Laterality: N/A;   LEFT HEART CATH AND CORS/GRAFTS ANGIOGRAPHY N/A 04/09/2018   Procedure: LEFT HEART CATH AND CORS/GRAFTS ANGIOGRAPHY;  Surgeon: Isaias Cowman, MD;  Location: [place] [facility];  Service: Cardiovascular;  Laterality: N/A;   LEFT HEART CATH AND CORS/GRAFTS ANGIOGRAPHY N/A 12/10/2018   Procedure: LEFT HEART CATH AND CORS/GRAFTS ANGIOGRAPHY;  Surgeon: Isaias Cowman, MD;  Location: [place] [facility];  Service: Cardiovascular;  Laterality: N/A;   LEFT HEART CATH AND CORS/GRAFTS ANGIOGRAPHY Left 05/25/2020   Procedure: LEFT HEART CATH AND CORS/GRAFTS ANGIOGRAPHY;  Surgeon: Isaias Cowman, MD;  Location: [place] [facility];  Service: Cardiovascular;  Laterality: Left;   RIB PLATING N/A 05/25/2016   Procedure: STERNAL PLATING;  Surgeon: Melrose Nakayama, MD;  Location: [place];  Service: Thoracic;  Laterality: N/A;   right shoulder     STERNAL WIRES REMOVAL N/A 03/30/2016   Procedure: STERNAL WIRES REMOVAL;  Surgeon: Melrose Nakayama, MD;  Location: [place];  Service: Thoracic;  Laterality: N/A;   TONSILLECTOMY      SOCIAL HISTORY: Social History   Socioeconomic History   Marital status: Married    Spouse name: Not on file   Number of children: Not on file   Years of education: Not on file   Highest education  level: Not on file  Occupational History   Not on file  Tobacco Use   Smoking status: Former    Packs/day: 1.00    Years: 35.00    Pack years: 35.00    Types: Cigarettes    Start date: 05/02/2009   Smokeless tobacco: Former  Scientific laboratory technician Use: Never used  Substance and Sexual Activity   Alcohol use: No   Drug use: No   Sexual activity: Yes  Other Topics Concern   Not on file  Social History Narrative   Not on file   Social Determinants of Health   Financial Resource Strain: Not on file  Food Insecurity: Not on file  Transportation Needs: Not on file  Physical Activity:  Not on file  Stress: Not on file  Social Connections: Not on file  Intimate Partner Violence: Not on file    FAMILY HISTORY Family History  Problem Relation Age of Onset   Heart attack Father 62       MI   Cancer Maternal Uncle     ALLERGIES:  is allergic to contrast media [iodinated diagnostic agents], metrizamide, oxycodone hcl, and vicodin [hydrocodone-acetaminophen].  MEDICATIONS:  Current Outpatient Medications  Medication Sig Dispense Refill   atorvastatin (LIPITOR) 80 MG tablet Take 1 tablet (80 mg total) by mouth daily at 6 PM. 30 tablet 0   ELIQUIS 2.5 MG TABS tablet Take 2.5 mg by mouth 2 (two) times daily.     ezetimibe (ZETIA) 10 MG tablet Take 1 tablet (10 mg total) by mouth daily. (Patient taking differently: Take 10 mg by mouth at bedtime.) 90 tablet 3   isosorbide mononitrate (IMDUR) 60 MG 24 hr tablet Take 60 mg by mouth 2 (two) times daily.      metoprolol succinate (TOPROL-XL) 25 MG 24 hr tablet Take 25 mg by mouth every evening.     nitroGLYCERIN (NITROSTAT) 0.4 MG SL tablet Place 1 tablet (0.4 mg total) under the tongue every 5 (five) minutes as needed for chest pain. 25 tablet 6   ranolazine (RANEXA) 1000 MG SR tablet Take 1 tablet (1,000 mg total) by mouth 2 (two) times daily. 180 tablet 3   mometasone-formoterol (DULERA) 100-5 MCG/ACT AERO Inhale 2 puffs into the lungs 2  (two) times daily. (Patient not taking: Reported on 12/08/2019) 0.1 g 0   Current Facility-Administered Medications  Medication Dose Route Frequency Provider Last Rate Last Admin   lidocaine (XYLOCAINE) 2 % jelly 1 application  1 application Urethral Once Stoioff, Scott C, MD        PHYSICAL EXAMINATION:  ECOG PERFORMANCE STATUS: 1 - Symptomatic but completely ambulatory   Vitals:   07/09/20 1339  BP: (!) 109/54  Pulse: (!) 51  Resp: 20  Temp: 98 F (36.7 C)    Filed Weights   07/09/20 1339  Weight: 175 lb (79.4 kg)     Physical Exam Constitutional:      General: He is not in acute distress.    Appearance: He is not diaphoretic.  HENT:     Head: Normocephalic and atraumatic.     Nose: Nose normal.     Mouth/Throat:     Pharynx: No oropharyngeal exudate.  Eyes:     General: No scleral icterus.    Conjunctiva/sclera: Conjunctivae normal.     Pupils: Pupils are equal, round, and reactive to light.  Cardiovascular:     Rate and Rhythm: Normal rate and regular rhythm.     Heart sounds: No murmur heard. Pulmonary:     Effort: Pulmonary effort is normal. No respiratory distress.     Breath sounds: Normal breath sounds.  Abdominal:     General: Bowel sounds are normal. There is no distension.     Palpations: Abdomen is soft.     Tenderness: There is no abdominal tenderness.  Musculoskeletal:        General: Normal range of motion.     Cervical back: Normal range of motion and neck supple.  Skin:    General: Skin is warm and dry.     Findings: No erythema.  Neurological:     Mental Status: He is alert and oriented to person, place, and time.     Cranial Nerves: No cranial nerve deficit.  Motor: No abnormal muscle tone.     Coordination: Coordination normal.  Psychiatric:        Mood and Affect: Affect normal.     LABORATORY DATA: I have personally reviewed the data as listed:  Appointment on 07/07/2020  Component Date Value Ref Range Status   Iron  07/07/2020 78  45 - 182 ug/dL Final   TIBC 07/07/2020 351  250 - 450 ug/dL Final   Saturation Ratios 07/07/2020 22  17.9 - 39.5 % Final   UIBC 07/07/2020 273  ug/dL Final   Performed at [facility], [address] [place], [place] [address]   Ferritin 07/07/2020 12 (A) 24 - 336 ng/mL Final   Performed at [facility] [address] [place], [place] [address]   WBC 07/07/2020 5.4  4.0 - 10.5 K/uL Final   RBC 07/07/2020 4.59  4.22 - 5.81 MIL/uL Final   Hemoglobin 07/07/2020 14.4  13.0 - 17.0 g/dL Final   HCT 07/07/2020 42.7  39.0 - 52.0 % Final   MCV 07/07/2020 93.0  80.0 - 100.0 fL Final   MCH 07/07/2020 31.4  26.0 - 34.0 pg Final   MCHC 07/07/2020 33.7  30.0 - 36.0 g/dL Final   RDW 07/07/2020 14.8  11.5 - 15.5 % Final   Platelets 07/07/2020 185  150 - 400 K/uL Final   nRBC 07/07/2020 0.0  0.0 - 0.2 % Final   Neutrophils Relative % 07/07/2020 53  % Final   Neutro Abs 07/07/2020 2.8  1.7 - 7.7 K/uL Final   Lymphocytes Relative 07/07/2020 35  % Final   Lymphs Abs 07/07/2020 1.9  0.7 - 4.0 K/uL Final   Monocytes Relative 07/07/2020 11  % Final   Monocytes Absolute 07/07/2020 0.6  0.1 - 1.0 K/uL Final   Eosinophils Relative 07/07/2020 1  % Final   Eosinophils Absolute 07/07/2020 0.1  0.0 - 0.5 K/uL Final   Basophils Relative 07/07/2020 0  % Final   Basophils Absolute 07/07/2020 0.0  0.0 - 0.1 K/uL Final   Immature Granulocytes 07/07/2020 0  % Final   Abs Immature Granulocytes 07/07/2020 0.01  0.00 - 0.07 K/uL Final   Performed at [facility], [address] [place], [place] [address]   Lab Results  Component Value Date   IRON 78 07/07/2020   TIBC 351 07/07/2020   IRONPCTSAT 22 07/07/2020   Lab Results  Component Value Date   FERRITIN 12 (L) 07/07/2020    RADIOGRAPHIC STUDIES: I have personally reviewed the radiological images as listed and agree with the findings in the report CARDIAC CATHETERIZATION  Result Date: 05/25/2020  Ost LM to Mid LM  lesion is 30% stenosed.  Prox LAD lesion is 40% stenosed.  Ost Cx to Prox Cx lesion is 80% stenosed.  2nd Mrg lesion is 100% stenosed.  Non-stenotic Ost RCA to Mid RCA lesion was previously treated.  Non-stenotic Mid RCA lesion was previously treated.  Previously placed Prox LAD to Mid LAD stent (unknown type) is widely patent.  1.  Three-vessel coronary artery disease 2.  Patent stents mid LAD, and proximal and mid RCA 3.  Patent LIMA to distal LAD, patent SVG to ramus intermedius 4.  Nonobstructive stenosis proximal LAD 5.  Chronic diffuse 80% stenosis ostial small caliber left circumflex with occluded OM 2 with ipsi-collaterals from ramus intermedius branch to OM2 6.  Mildly reduced left ventricular function, with posterior wall akinesis, estimated LVEF 40% Recommendations 1.  Continue medical therapy 2.  Aggressive  risk factor modification   US BREAST LTD UNI LEFT INC AXILLA  Result Date: 05/20/2020 CLINICAL DATA:  80 year old male presenting with intermittent right breast pain and left axillary tenderness/fullness for 2 months. EXAM: DIGITAL DIAGNOSTIC BILATERAL MAMMOGRAM WITH TOMOSYNTHESIS AND CAD; ULTRASOUND LEFT BREAST LIMITED TECHNIQUE: Bilateral digital diagnostic mammography and breast tomosynthesis was performed. The images were evaluated with computer-aided detection.; Targeted ultrasound examination of the left breast was performed COMPARISON:  None. ACR Breast Density Category a: The breast tissue is almost entirely fatty. FINDINGS: Mammogram: Right breast: No suspicious mass, distortion, or microcalcifications are identified to suggest presence of malignancy. Left breast: No suspicious mass, distortion, or microcalcifications are identified to suggest presence of malignancy. There is no abnormality in the visualized portion of the left axilla. On physical exam at site of concern reported by the patient in the low left axilla I do not feel a fixed discrete mass or focal area of thickening.  Targeted ultrasound performed at the site of concern reported by the patient in the left axilla demonstrating no cystic or solid mass. No fluid collection. There are normal lymph nodes. IMPRESSION: No mammographic or sonographic evidence of malignancy or imaging abnormality to explain the patient's intermittent right breast pain and left axillary tenderness/fullness. RECOMMENDATION: Clinical follow-up as needed for the patient's bilateral breast/axillary symptoms. I have discussed the findings and recommendations with the patient. If applicable, a reminder letter will be sent to the patient regarding the next appointment. BI-RADS CATEGORY  1: Negative. Electronically Signed   By: Audie Pinto M.D.   On: 05/20/2020 15:36   MM DIAG BREAST TOMO BILATERAL  Result Date: 05/20/2020 CLINICAL DATA:  80 year old male presenting with intermittent right breast pain and left axillary tenderness/fullness for 2 months. EXAM: DIGITAL DIAGNOSTIC BILATERAL MAMMOGRAM WITH TOMOSYNTHESIS AND CAD; ULTRASOUND LEFT BREAST LIMITED TECHNIQUE: Bilateral digital diagnostic mammography and breast tomosynthesis was performed. The images were evaluated with computer-aided detection.; Targeted ultrasound examination of the left breast was performed COMPARISON:  None. ACR Breast Density Category a: The breast tissue is almost entirely fatty. FINDINGS: Mammogram: Right breast: No suspicious mass, distortion, or microcalcifications are identified to suggest presence of malignancy. Left breast: No suspicious mass, distortion, or microcalcifications are identified to suggest presence of malignancy. There is no abnormality in the visualized portion of the left axilla. On physical exam at site of concern reported by the patient in the low left axilla I do not feel a fixed discrete mass or focal area of thickening. Targeted ultrasound performed at the site of concern reported by the patient in the left axilla demonstrating no cystic or solid  mass. No fluid collection. There are normal lymph nodes. IMPRESSION: No mammographic or sonographic evidence of malignancy or imaging abnormality to explain the patient's intermittent right breast pain and left axillary tenderness/fullness. RECOMMENDATION: Clinical follow-up as needed for the patient's bilateral breast/axillary symptoms. I have discussed the findings and recommendations with the patient. If applicable, a reminder letter will be sent to the patient regarding the next appointment. BI-RADS CATEGORY  1: Negative. Electronically Signed   By: Audie Pinto M.D.   On: 05/20/2020 15:36     ASSESSMENT/PLAN 1. Iron deficiency anemia due to chronic blood loss    1.  History of iron deficiency anemia-labs reviewed and discussed today.  Hemoglobin is normal.  Ferritin persistently low at 12.  Last received 1 dose of Venofer last year.  Said he felt no different.  Iron saturation 22.  TIBC normal.  Unclear if symptoms are  related to iron deficiency.  We will give him 2 doses of Venofer with short follow-up in 8 weeks to recheck labs and reevaluate symptoms.  2.  History of smoking-patient qualifies for low-dose CT screening for lung cancer.  Says he smoked for roughly 50 years and quit approximately 10 years ago.  Will place order for low-dose CT today until have a shared decision making visit prior to study.  3.  History of PE-on chronic anticoagulation.  History of A. fib as well.  Continue Eliquis 2.5 mg twice daily  Plan: Low dose CT lung screening Iron today and next week 8 weeks-labs & see NP 1-2 days later   Beckey Rutter, DNP, AGNP-C [place] at [facility] [phone] (clinic)

## 2020-07-15 ENCOUNTER — Telehealth: Payer: Self-pay | Admitting: *Deleted

## 2020-07-15 DIAGNOSIS — Z87891 Personal history of nicotine dependence: Secondary | ICD-10-CM

## 2020-07-15 DIAGNOSIS — Z122 Encounter for screening for malignant neoplasm of respiratory organs: Secondary | ICD-10-CM

## 2020-07-15 NOTE — Telephone Encounter (Signed)
Received referral for initial lung cancer screening scan. Contacted patient and obtained smoking history,(former, quit 2009, 35 pack year) as well as answering questions related to screening process. Patient denies signs of lung cancer such as weight loss or hemoptysis. Patient denies comorbidity that would prevent curative treatment if lung cancer were found. Patient is scheduled for shared decision making visit and CT scan on 07/23/20.

## 2020-07-16 ENCOUNTER — Inpatient Hospital Stay: Payer: Medicare HMO

## 2020-07-23 ENCOUNTER — Ambulatory Visit
Admission: RE | Admit: 2020-07-23 | Discharge: 2020-07-23 | Disposition: A | Discharge status: Home / Self Care | Payer: Medicare HMO | Source: Ambulatory Visit | Attending: Nurse Practitioner | Admitting: Nurse Practitioner

## 2020-07-23 ENCOUNTER — Inpatient Hospital Stay: Payer: Medicare HMO | Attending: Nurse Practitioner | Admitting: Nurse Practitioner

## 2020-07-23 ENCOUNTER — Other Ambulatory Visit: Payer: Self-pay

## 2020-07-23 DIAGNOSIS — Z122 Encounter for screening for malignant neoplasm of respiratory organs: Secondary | ICD-10-CM | POA: Diagnosis present

## 2020-07-23 DIAGNOSIS — Z87891 Personal history of nicotine dependence: Secondary | ICD-10-CM

## 2020-07-23 NOTE — Progress Notes (Signed)
Virtual Visit via Video Enabled Telemedicine Note   I connected with Scott [name] on 07/23/20 at 8:30 AM EST by video enabled telemedicine visit and verified that I am speaking with the correct person using two identifiers.   I discussed the limitations, risks, security and privacy concerns of performing an evaluation and management service by telemedicine and the availability of in-person appointments. I also discussed with the patient that there may be a patient responsible charge related to this service. The patient expressed understanding and agreed to proceed.   Other persons participating in the visit and their role in the encounter: Burgess Estelle, RN- checking in patient & navigation  Patient's location: [place]  Provider's location: Clinic  Chief Complaint: Low Dose CT Screening  Patient agreed to evaluation by telemedicine to discuss shared decision making for consideration of low dose CT lung cancer screening.    In accordance with CMS guidelines, patient has met eligibility criteria including age, absence of signs or symptoms of lung cancer.  Social History   Tobacco Use   Smoking status: Former    Packs/day: 1.00    Years: 35.00    Pack years: 35.00    Types: Cigarettes    Quit date: 2009    Years since quitting: 13.5   Smokeless tobacco: Former  Substance Use Topics   Alcohol use: No     A shared decision-making session was conducted prior to the performance of CT scan. This includes one or more decision aids, includes benefits and harms of screening, follow-up diagnostic testing, over-diagnosis, false positive rate, and total radiation exposure.   Counseling on the importance of adherence to annual lung cancer LDCT screening, impact of co-morbidities, and ability or willingness to undergo diagnosis and treatment is imperative for compliance of the program.   Counseling on the importance of continued smoking cessation for former smokers; the importance of  smoking cessation for current smokers, and information about tobacco cessation interventions have been given to patient including [place] and [address] [place] programs.   Written order for lung cancer screening with LDCT has been given to the patient and any and all questions have been answered to the best of my abilities.    Yearly follow up will be coordinated by Burgess Estelle, Thoracic Navigator.  I discussed the assessment and treatment plan with the patient. The patient was provided an opportunity to ask questions and all were answered. The patient agreed with the plan and demonstrated an understanding of the instructions.   The patient was advised to call back or seek an in-person evaluation if the symptoms worsen or if the condition fails to improve as anticipated.   I provided 15 minutes of face-to-face video visit time dedicated to the care of this patient on the date of this encounter to include pre-visit review of smoking history, face-to-face time with the patient, and post visit ordering of testing/documentation.   Beckey Rutter, DNP, AGNP-C [place] at [facility] [phone] (clinic)

## 2020-07-27 ENCOUNTER — Telehealth: Payer: Self-pay | Admitting: *Deleted

## 2020-07-27 NOTE — Telephone Encounter (Signed)
Notified patient of LDCT lung cancer screening program results with recommendation for 12 month follow up imaging. Also notified of incidental findings noted below and is encouraged to discuss further with PCP who will receive a copy of this note and/or the CT report. Patient verbalizes understanding.    IMPRESSION: 1. Lung-RADS 2, benign appearance or behavior. Continue annual screening with low-dose chest CT without contrast in 12 months. 2. 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   Aortic Atherosclerosis (ICD10-I70.0) and Emphysema (ICD10-J43.9).

## 2020-08-25 ENCOUNTER — Other Ambulatory Visit: Payer: Medicare HMO

## 2020-08-31 ENCOUNTER — Inpatient Hospital Stay: Payer: Medicare HMO | Attending: Oncology

## 2020-08-31 DIAGNOSIS — I4891 Unspecified atrial fibrillation: Secondary | ICD-10-CM | POA: Diagnosis not present

## 2020-08-31 DIAGNOSIS — Z86711 Personal history of pulmonary embolism: Secondary | ICD-10-CM | POA: Diagnosis not present

## 2020-08-31 DIAGNOSIS — Z7901 Long term (current) use of anticoagulants: Secondary | ICD-10-CM | POA: Insufficient documentation

## 2020-08-31 DIAGNOSIS — D509 Iron deficiency anemia, unspecified: Secondary | ICD-10-CM | POA: Insufficient documentation

## 2020-08-31 DIAGNOSIS — D500 Iron deficiency anemia secondary to blood loss (chronic): Secondary | ICD-10-CM

## 2020-08-31 LAB — CBC
HCT: 44.1 % (ref 39.0–52.0)
Hemoglobin: 14.8 g/dL (ref 13.0–17.0)
MCH: 32.2 pg (ref 26.0–34.0)
MCHC: 33.6 g/dL (ref 30.0–36.0)
MCV: 95.9 fL (ref 80.0–100.0)
Platelets: 186 10*3/uL (ref 150–400)
RBC: 4.6 MIL/uL (ref 4.22–5.81)
RDW: 14.2 % (ref 11.5–15.5)
WBC: 7 10*3/uL (ref 4.0–10.5)
nRBC: 0 % (ref 0.0–0.2)

## 2020-08-31 LAB — IRON AND TIBC
Iron: 73 ug/dL (ref 45–182)
Saturation Ratios: 21 % (ref 17.9–39.5)
TIBC: 350 ug/dL (ref 250–450)
UIBC: 277 ug/dL

## 2020-08-31 LAB — FERRITIN: Ferritin: 19 ng/mL — ABNORMAL LOW (ref 24–336)

## 2020-09-02 ENCOUNTER — Other Ambulatory Visit: Payer: Self-pay

## 2020-09-02 ENCOUNTER — Inpatient Hospital Stay: Payer: Medicare HMO

## 2020-09-02 ENCOUNTER — Inpatient Hospital Stay: Payer: Medicare HMO | Admitting: Nurse Practitioner

## 2020-09-02 VITALS — BP 118/72 | HR 62 | Resp 18

## 2020-09-02 VITALS — BP 106/64 | HR 54 | Temp 99.0°F | Resp 18 | Wt 180.3 lb

## 2020-09-02 DIAGNOSIS — Z87891 Personal history of nicotine dependence: Secondary | ICD-10-CM | POA: Diagnosis not present

## 2020-09-02 DIAGNOSIS — D500 Iron deficiency anemia secondary to blood loss (chronic): Secondary | ICD-10-CM | POA: Diagnosis not present

## 2020-09-02 DIAGNOSIS — Z7901 Long term (current) use of anticoagulants: Secondary | ICD-10-CM

## 2020-09-02 DIAGNOSIS — D509 Iron deficiency anemia, unspecified: Secondary | ICD-10-CM | POA: Diagnosis not present

## 2020-09-02 MED ORDER — IRON SUCROSE 20 MG/ML IV SOLN
200.0000 mg | Freq: Once | INTRAVENOUS | Status: AC
  Administered 2020-09-02: 200 mg via INTRAVENOUS
  Filled 2020-09-02: qty 10

## 2020-09-02 NOTE — Progress Notes (Signed)
[facility] Follow Up Visit  Patient Care Team: Baxter Hire, MD as PCP - General (Internal Medicine) Rockey Situ [name], MD as PCP - Cardiology (Cardiology) Madelyn Brunner, MD (Internal Medicine) Alisa Graff, FNP as Nurse Practitioner (Cardiology) Minna Merritts, MD as Consulting Physician (Cardiology)  REASON FOR VISIT Follow up for treatment of iron deficiency anemia  HISTORY OF PRESENTING ILLNESS: Scott [name] 80 y.o. male with PMH listed as below who is referred to me for evaluation and management of pulmonary embolism.  Scott [name] has a history paroxymal atrial fibrillation on Eliquis '5mg'$  BID until recently, history of CAD s/p CABG x 2 at [facility] in 2016 with chronic chest pain, s/p removal of sternal wire on 03/2016,  s/p sternal debridement and plating on 05/25/2016 in the setting of CT chest performed by pain management showing sternal nonunion of the manubrium with subsequent improvement in his pain. Perioperatively his anticoagulation with Eliquis was discontinue a few days prior surgery and resumed after surgery. Operation note mentioned that during the procedure, the left lung was densely adherent to the underside of the sternum and tear in the lung did occur and was sutured. He presents  to [facility] on 08/04/2016 for evaluation of SOB and was found to have a PE. Patient states that he was compliant with Eliquis.  CTA chest showed left upper lobe PE extending into the left main pulmonary artery. His Eliquis was changed to Xarelto. Lower extremity dopplers were negative for DVT. Hypercoagulable workup was sent and all tests were negative except a mild low protein S function.  No formal cancer workup.  Sed rate normal, heavy metals negative, TSH mildly reduced at 0.253. 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   Hypercoagulable work up: discussed with patient. He is tested  negative for factor 5 leiden mutation, prothrombin mutation, anti phospholipin antibody panels. Normal protein C activity. Normal total protein S, mildly low protein S function, can be falsely low in the acute setting of thrombosis.Congential protein S deficiency is unlikely given that he has no prior history of VTE. Repeat protein S total and free were normal.   # He has had one dose of Feraheme and during the treatment he had a chest tightness and end of infusion. Has received IV iron Venofer and tolerates well.   #chronic intermittent chest pain and shortness of breath that has been previously extensively worked up.  He follows up with Dr. Nyoka Cowden for his heart care.  Extensive cardiology disease CAD status post prior RCA and LAD stenting as well as CABG x2 chronic chest pain status post sternal wire removal with sternal debridement and plating, ischemia cardiomyopathy, chronic dyspnea.  # CT angios chest PE protocol done on 11/07/2016 showed no lung nodules or mass.  INTERVAL HISTORY: Patient is 80 year old male who returns to clinic for follow-up for chronic anticoagulation and iron deficiency.  Persistent left-sided chest type of some intermittent shortness of breath.  Chronic fatigue.  He continues Eliquis for history of pulmonary embolism.  He feels more reassured having had low-dose CT but this is about lung cancer related.  He is highly anxious.  He does feel somewhat better after getting his IV iron.  The symptoms are chronic and ongoing.  No fevers, chills, infections.  No black or bloody stools.  No abdominal pain, nausea, vomiting, constipation, diarrhea.  No hematuria.  Review of Systems  Constitutional:  Positive for fatigue. Negative for appetite change, chills, fever and unexpected weight change.  HENT:   Negative for hearing loss and voice change.   Eyes:  Negative for eye problems and icterus.  Respiratory:  Positive for shortness of breath. Negative for chest tightness and cough.         Intermittent chest pain.   Cardiovascular:  Negative for chest pain and leg swelling.  Gastrointestinal:  Negative for abdominal distention and abdominal pain.  Endocrine: Negative for hot flashes.  Genitourinary:  Negative for difficulty urinating, dysuria and frequency.   Musculoskeletal:  Negative for arthralgias.  Skin:  Negative for itching and rash.  Neurological:  Negative for light-headedness and numbness.  Hematological:  Negative for adenopathy. Does not bruise/bleed easily.  Psychiatric/Behavioral:  Negative for confusion. The patient is nervous/anxious.    MEDICAL HISTORY: Past Medical History:  Diagnosis Date   Arthritis    right hip, since a fall   CAD (coronary artery disease)    a. 04/2012 Cath: 3VD->Med Rx;  b. 04/2013 PCI RCA (2 DES); c. 03/2014 PCI: LAD 80 (3.0x23 Xience Alpine DES); d. 06/2014 CABG x 2 ([facility]) LIMA->LAD, VG->OM; e. 12/2014 Cath ([facility]): patent grafts->Med Rx; f. 08/2015 Cath: patent grafts; g. 11/2016 Cath: LM min irregs, LAD 20p, 85m LCX 100p/m, RCA 20ost, 10p/m/d, VG->OM1 nl, LIMA->dLAD nl; f. 07/2017 MV: No isch, EF 51%.   Cardiomyopathy, ischemic    a. 04/2012 Echo: EF 40-45%;  b. 08/2013 Echo: EF 45-50%; c. 01/2015 Echo: EF 40-45%; d. 07/2016 Echo: EF 60-65%; e. 09/2017 Echo: EF 55-60%, Gr1 DD.   Carotid arterial disease ([facility]    a. 05/2013 Carotid U/S; bilat 40-50% ICA stenosis.   Chronic Chest Pain    USES NITRO   Chronic systolic CHF (congestive heart failure) [place]    a. 01/2015 Echo: EF 40-45%; b. 07/2016 Echo: EF 60-65%; c. 09/2017 Echo: EF 55-60%, Gr1 DD, nl RV fxn.   Cough    Diverticulosis    Dizziness    Dyspnea    WITH EXERTION   Headache 1970's   migraine   History of blood transfusion 2016   post op   Hyperlipidemia    Hypertension    Iron deficiency anemia due to chronic blood loss 09/11/2016   Myocardial infarction ([facility]    unsure of when   Pulmonary [place]oli ([place] 07[place]2016   a. On Xarelto.   Reflux esophagitis    Sternal pain     a. 03/2016 s/p Sternal wire removal; b. 05/2016 s/p redo median sternotomy for sternal debridement and sternal plating.   Syncope 04/2018   CARDIOLOGIST WAS DR GRockey SituAND HE WAS AWARE-NOW PT SEES PARASCHOS   Tubular adenoma of colon     SURGICAL HISTORY: Past Surgical History:  Procedure Laterality Date   CARDIAC CATHETERIZATION  05/01/2012   CARDIAC CATHETERIZATION  04/2013   [facility];x3 stent   CARDIAC CATHETERIZATION  01/11/15    [facility]   CARDIAC CATHETERIZATION N/A 09/16/2015   Procedure: LEFT HEART CATH AND CORS/GRAFTS ANGIOGRAPHY;  Surgeon: TMinna Merritts MD;  [facility];  Service: Cardiovascular;  Laterality: N/A;   CARPAL TUNNEL RELEASE     right hand   CATARACT EXTRACTION     LEFT   CATARACT EXTRACTION W/PHACO Left 09/16/2014   Procedure: CATARACT EXTRACTION PHACO AND INTRAOCULAR LENS PLACEMENT (IOC);  Surgeon: CLeandrew Koyanagi MD;  [place] [facility]  Service: Ophthalmology;  Laterality: Left;   COLONOSCOPY     COLONOSCOPY WITH  PROPOFOL N/A 01/28/2016   Procedure: COLONOSCOPY WITH PROPOFOL;  Surgeon: Lollie Sails, MD;  Location[facility] END[facility];  Service: Endoscopy;  Laterality: N/A;   CORONARY ANGIOPLASTY WITH STENT PLACEMENT  04/13/2014   CORONARY ARTERY BYPASS GRAFT  06-26-14   x3 bypasses   CORONARY PRESSURE WIRE/FFR WITH 3D MAPPING N/A 12/10/2018   Procedure: Suzette Battiest PRESSURE WIRE/FFR STUDY;  Surgeon: Isaias Cowman, MD;  Location: [place] [facility];  Service: Cardiovascular;  Laterality: N/A;   CYSTOSCOPY N/A 08/14/2018   Procedure: CYSTOSCOPY;  Surgeon: Abbie Sons, MD;  Location: [facility]S;  Service: Urology;  Laterality: N/A;   DE QUERVAIN'S RELEASE Left 08/22/2012   ESOPHAGOGASTRODUODENOSCOPY (EGD) WITH PROPOFOL N/A 04/26/2015   Procedure: ESOPHAGOGASTRODUODENOSCOPY (EGD) WITH PROPOFOL;  Surgeon: Hulen Luster, MD;  Location: [facility]OSCOPY;  Service: Gastroenterology;  Laterality: N/A;   ESOPHAGOGASTRODUODENOSCOPY (EGD) WITH  PROPOFOL N/A 05/29/2017   Procedure: ESOPHAGOGASTRODUODENOSCOPY (EGD) WITH PROPOFOL;  Surgeon: Lollie Sails, MD;  Location: [facility] ENDOSCOPY;  Service: Endoscopy;  Laterality: N/A;   INTRAVASCULAR ULTRASOUND/IVUS N/A 12/10/2018   Procedure: Intravascular Ultrasound/IVUS;  Surgeon: Isaias Cowman, MD;  Location: [place] [facility];  Service: Cardiovascular;  Laterality: N/A;   LEFT HEART CATH AND CORONARY ANGIOGRAPHY N/A 12/04/2016   Procedure: LEFT HEART CATH AND CORS/GRAFTS  ANGIOGRAPHY;  Surgeon: Wellington Hampshire, MD;  Location: [place] [facility];  Service: Cardiovascular;  Laterality: N/A;   LEFT HEART CATH AND CORS/GRAFTS ANGIOGRAPHY N/A 04/09/2018   Procedure: LEFT HEART CATH AND CORS/GRAFTS ANGIOGRAPHY;  Surgeon: Isaias Cowman, MD;  Location: [place] [facility];  Service: Cardiovascular;  Laterality: N/A;   LEFT HEART CATH AND CORS/GRAFTS ANGIOGRAPHY N/A 12/10/2018   Procedure: LEFT HEART CATH AND CORS/GRAFTS ANGIOGRAPHY;  Surgeon: Isaias Cowman, MD;  Location: [place] [facility];  Service: Cardiovascular;  Laterality: N/A;   LEFT HEART CATH AND CORS/GRAFTS ANGIOGRAPHY Left 05/25/2020   Procedure: LEFT HEART CATH AND CORS/GRAFTS ANGIOGRAPHY;  Surgeon: Isaias Cowman, MD;  Location: [place] CV LAB;  Service: Cardiovascular;  Laterality: Left;   RIB PLATING N/A 05/25/2016   Procedure: STERNAL PLATING;  Surgeon: Melrose Nakayama, MD;  Location: [place];  Service: Thoracic;  Laterality: N/A;   right shoulder     STERNAL WIRES REMOVAL N/A 03/30/2016   Procedure: STERNAL WIRES REMOVAL;  Surgeon: Melrose Nakayama, MD;  [facility];  Service: Thoracic;  Laterality: N/A;   TONSILLECTOMY      SOCIAL HISTORY: Social History   Socioeconomic History   Marital status: Married    Spouse name: Not on file   Number of children: Not on file   Years of education: Not on file   Highest education level: Not on file  Occupational History   Not on file   Tobacco Use   Smoking status: Former    Packs/day: 1.00    Years: 35.00    Pack years: 35.00    Types: Cigarettes    Quit date: 2009    Years since quitting: 13.6   Smokeless tobacco: Former  Scientific laboratory technician Use: Never used  Substance and Sexual Activity   Alcohol use: No   Drug use: No   Sexual activity: Yes  Other Topics Concern   Not on file  Social History Narrative   Not on file   Social Determinants of Health   Financial Resource Strain: Not on file  Food Insecurity: Not on file  Transportation Needs: Not on file  Physical Activity: Not on file  Stress: Not on file  Social Connections: Not on file  Intimate Partner Violence: Not on file    FAMILY HISTORY Family History  Problem Relation Age of Onset   Heart attack Father 52       MI   Cancer Maternal Uncle     ALLERGIES:  is allergic to contrast media [iodinated diagnostic agents], metrizamide, oxycodone hcl, and vicodin [hydrocodone-acetaminophen].  MEDICATIONS:  Current Outpatient Medications  Medication Sig Dispense Refill   atorvastatin (LIPITOR) 80 MG tablet Take 1 tablet (80 mg total) by mouth daily at 6 PM. 30 tablet 0   ELIQUIS 2.5 MG TABS tablet Take 2.5 mg by mouth 2 (two) times daily.     ezetimibe (ZETIA) 10 MG tablet Take 1 tablet (10 mg total) by mouth daily. (Patient taking differently: Take 10 mg by mouth at bedtime.) 90 tablet 3   isosorbide mononitrate (IMDUR) 60 MG 24 hr tablet Take 60 mg by mouth 2 (two) times daily.      metoprolol succinate (TOPROL-XL) 25 MG 24 hr tablet Take 25 mg by mouth every evening.     nitroGLYCERIN (NITROSTAT) 0.4 MG SL tablet Place 1 tablet (0.4 mg total) under the tongue every 5 (five) minutes as needed for chest pain. 25 tablet 6   ranolazine (RANEXA) 1000 MG SR tablet Take 1 tablet (1,000 mg total) by mouth 2 (two) times daily. 180 tablet 3   Current Facility-Administered Medications  Medication Dose Route Frequency Provider Last Rate Last Admin    lidocaine (XYLOCAINE) 2 % jelly 1 application  1 application Urethral Once Stoioff, Scott C, MD        PHYSICAL EXAMINATION:  ECOG PERFORMANCE STATUS: 1 - Symptomatic but completely ambulatory Vitals:   09/02/20 1406  Temp: 99 F (37.2 C)   Filed Weights   09/02/20 1406  Weight: 180 lb 5 oz (81.8 kg)   Physical Exam Constitutional:      Appearance: He is not ill-appearing.  Eyes:     General: No scleral icterus.    Conjunctiva/sclera: Conjunctivae normal.  Cardiovascular:     Rate and Rhythm: Normal rate. Rhythm irregular.  Abdominal:     General: There is no distension.     Palpations: Abdomen is soft.     Tenderness: There is no abdominal tenderness. There is no guarding.  Musculoskeletal:        General: No deformity.     Right lower leg: No edema.     Left lower leg: No edema.  Lymphadenopathy:     Cervical: No cervical adenopathy.  Skin:    General: Skin is warm and dry.  Neurological:     Mental Status: He is alert and oriented to person, place, and time. Mental status is at baseline.  Psychiatric:        Mood and Affect: Mood is anxious.        Behavior: Behavior normal.     LABORATORY DATA: I have personally reviewed the data as listed:  Appointment on 08/31/2020  Component Date Value Ref Range Status   Iron 08/31/2020 73  45 - 182 ug/dL Final   TIBC 08/31/2020 350  250 - 450 ug/dL Final   Saturation Ratios 08/31/2020 21  17.9 - 39.5 % Final   UIBC 08/31/2020 277  ug/dL Final   Performed at [facility], [address] [place], [place] [address]   Ferritin 08/31/2020 19 (A) 24 - 336 ng/mL Final   Performed at [facility], [employer],  [place], [place] [address]   WBC 08/31/2020 7.0  4.0 - 10.5 K/uL Final   RBC 08/31/2020 4.60  4.22 - 5.81 MIL/uL Final   Hemoglobin 08/31/2020 14.8  13.0 - 17.0 g/dL Final   HCT 08/31/2020 44.1  39.0 - 52.0 % Final   MCV 08/31/2020 95.9  80.0 - 100.0 fL Final   MCH 08/31/2020 32.2  26.0 - 34.0  pg Final   MCHC 08/31/2020 33.6  30.0 - 36.0 g/dL Final   RDW 08/31/2020 14.2  11.5 - 15.5 % Final   Platelets 08/31/2020 186  150 - 400 K/uL Final   nRBC 08/31/2020 0.0  0.0 - 0.2 % Final   Performed at [facility], [address] [place], [place] [address]   Lab Results  Component Value Date   IRON 73 08/31/2020   TIBC 350 08/31/2020   IRONPCTSAT 21 08/31/2020   Lab Results  Component Value Date   FERRITIN 19 (L) 08/31/2020    RADIOGRAPHIC STUDIES: I have personally reviewed the radiological images as listed and agree with the findings in the report CT CHEST LUNG CANCER SCREENING LOW DOSE WO CONTRAST  Result Date: 07/25/2020 CLINICAL DATA:  Lung cancer screening. Thirty-five pack-year history. Former asymptomatic smoker. EXAM: CT CHEST WITHOUT CONTRAST LOW-DOSE FOR LUNG CANCER SCREENING TECHNIQUE: Multidetector CT imaging of the chest was performed following the standard protocol without IV contrast. COMPARISON:  08/01/2019 FINDINGS: Cardiovascular: Status post median sternotomy and CABG. Normal heart size. No pericardial effusion. Aortic atherosclerosis. Mediastinum/Nodes: No enlarged mediastinal, hilar, or axillary lymph nodes. Calcified right paratracheal lymph node compatible with prior granulomatous disease. Thyroid gland, trachea, and esophagus demonstrate no significant findings. Lungs/Pleura: No pleural effusion, airspace consolidation, or atelectasis. Mild centrilobular and paraseptal emphysema. 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 There are few small pulmonary nodules identified. The largest is in the anteromedial right upper lobe with a mean derived diameter of 5.6 mm. Upper Abdomen: No acute abnormality. Musculoskeletal: Spondylosis identified within the thoracic spine. No acute or suspicious osseous findings. IMPRESSION: 1.  Lung-RADS 2, benign appearance or behavior. Continue annual screening with low-dose chest CT without contrast in 12 months. 2. 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 Aortic Atherosclerosis (ICD10-I70.0) and Emphysema (ICD10-J43.9). Electronically Signed   By: Kerby Moors M.D.   On: 07/25/2020 14:38     ASSESSMENT/PLAN No diagnosis found.  1.  History of iron deficiency anemia-hemoglobin has improved to 14.8.  Normocytic.  Ferritin improved to 19 (previously 12).  Iron saturation 21%.  Plan to give him Venofer x 2. RTC in 12 weeks for labs (cbc, ferritin, iron studies). He can see MD to establish care day to week later with possible venofer at taht time.   2.  History of smoking- Low dose ct for lung cancer screening was negative for malignancy. Can repeat in 1 year so long as he meets eligibility criteria otherwise.  3.  History of PE-on chronic anticoagulation.  History of A. fib.  Continue Eliquis 2.5 mg twice daily  Beckey Rutter, DNP, AGNP-C [place] at [facility] [phone] (clinic)

## 2020-09-02 NOTE — Progress Notes (Signed)
Upon walking pt out after infusion, pt became lightheaded, dizzy and pale.  Called for ;nursing team to bring me a chair.  Pt sat down still pale, bp 141/78 HR 50 , sats 100%. Pt appeared SOB, denies it though". Did state he had chest discomfort "that is my normal th."  Faythe Casa here and assessing pt.  Recheck BP 171/106. Pt color is better   1525 bp 144/74. oK to allow pt to leave. Did tell Pt wife about incident. And to follow up if pt continues with dizzyness.

## 2020-09-02 NOTE — Progress Notes (Signed)
Follow-up anemia. Pt reports that he gets constipated a lot lately. Also reports intermittent dizziness.

## 2020-09-02 NOTE — Patient Instructions (Signed)
[facility]  Discharge Instructions: Thank you for choosing [facility] to provide your oncology and hematology care.  If you have a lab appointment with the Cancer Center, please go directly to the Cancer Center and check in at the registration area.  Wear comfortable clothing and clothing appropriate for easy access to any Portacath or PICC line.   We strive to give you quality time with your provider. You may need to reschedule your appointment if you arrive late (15 or more minutes).  Arriving late affects you and other patients whose appointments are after yours.  Also, if you miss three or more appointments without notifying the office, you may be dismissed from the clinic at the provider's discretion.      For prescription refill requests, have your pharmacy contact our office and allow 72 hours for refills to be completed.    Today you received the following chemotherapy and/or immunotherapy agents VENOFER      To help prevent nausea and vomiting after your treatment, we encourage you to take your nausea medication as directed.  BELOW ARE SYMPTOMS THAT SHOULD BE REPORTED IMMEDIATELY: *FEVER GREATER THAN 100.4 F (38 C) OR HIGHER *CHILLS OR SWEATING *NAUSEA AND VOMITING THAT IS NOT CONTROLLED WITH YOUR NAUSEA MEDICATION *UNUSUAL SHORTNESS OF BREATH *UNUSUAL BRUISING OR BLEEDING *URINARY PROBLEMS (pain or burning when urinating, or frequent urination) *BOWEL PROBLEMS (unusual diarrhea, constipation, pain near the anus) TENDERNESS IN MOUTH AND THROAT WITH OR WITHOUT PRESENCE OF ULCERS (sore throat, sores in mouth, or a toothache) UNUSUAL RASH, SWELLING OR PAIN  UNUSUAL VAGINAL DISCHARGE OR ITCHING   Items with * indicate a potential emergency and should be followed up as soon as possible or go to the Emergency Department if any problems should occur.  Please show the CHEMOTHERAPY ALERT CARD or IMMUNOTHERAPY ALERT CARD at check-in to  the Emergency Department and triage nurse.  Should you have questions after your visit or need to cancel or reschedule your appointment, please contact [facility]  [phone] and follow the prompts.  Office hours are 8:00 a.m. to 4:30 p.m. Monday - Friday. Please note that voicemails left after 4:00 p.m. may not be returned until the following business day.  We are closed weekends and major holidays. You have access to a nurse at all times for urgent questions. Please call the main number to the clinic [phone] and follow the prompts.  For any non-urgent questions, you may also contact your provider using MyChart. We now offer e-Visits for anyone 18 and older to request care online for non-urgent symptoms. For details visit [email or web address].   Also download the MyChart app! Go to the app store, search "MyChart", open the app, select [facility], and log in with your MyChart username and password.  Due to Covid, a mask is required upon entering the hospital/clinic. If you do not have a mask, one will be given to you upon arrival. For doctor visits, patients may have 1 support person aged 18 or older with them. For treatment visits, patients cannot have anyone with them due to current Covid guidelines and our immunocompromised population.   Iron Sucrose injection What is this medication? IRON SUCROSE (AHY ern SOO krohs) is an iron complex. Iron is used to make healthy red blood cells, which carry oxygen and nutrients throughout the body. This medicine is used to treat iron deficiency anemia in people with chronickidney disease. This medicine may be used for other   purposes; ask your health care provider orpharmacist if you have questions. COMMON BRAND NAME(S): Venofer What should I tell my care team before I take this medication? They need to know if you have any of these conditions: anemia not caused by low iron levels heart disease high levels of  iron in the blood kidney disease liver disease an unusual or allergic reaction to iron, other medicines, foods, dyes, or preservatives pregnant or trying to get pregnant breast-feeding How should I use this medication? This medicine is for infusion into a vein. It is given by a health careprofessional in a hospital or clinic setting. Talk to your pediatrician regarding the use of this medicine in children. While this drug may be prescribed for children as young as 2 years for selectedconditions, precautions do apply. Overdosage: If you think you have taken too much of this medicine contact apoison control center or emergency room at once. NOTE: This medicine is only for you. Do not share this medicine with others. What if I miss a dose? It is important not to miss your dose. Call your doctor or health careprofessional if you are unable to keep an appointment. What may interact with this medication? Do not take this medicine with any of the following medications: deferoxamine dimercaprol other iron products This medicine may also interact with the following medications: chloramphenicol deferasirox This list may not describe all possible interactions. Give your health care provider a list of all the medicines, herbs, non-prescription drugs, or dietary supplements you use. Also tell them if you smoke, drink alcohol, or use illegaldrugs. Some items may interact with your medicine. What should I watch for while using this medication? Visit your doctor or healthcare professional regularly. Tell your doctor or healthcare professional if your symptoms do not start to get better or if theyget worse. You may need blood work done while you are taking this medicine. You may need to follow a special diet. Talk to your doctor. Foods that contain iron include: whole grains/cereals, dried fruits, beans, or peas, leafy greenvegetables, and organ meats (liver, kidney). What side effects may I notice from  receiving this medication? Side effects that you should report to your doctor or health care professionalas soon as possible: allergic reactions like skin rash, itching or hives, swelling of the face, lips, or tongue breathing problems changes in blood pressure cough fast, irregular heartbeat feeling faint or lightheaded, falls fever or chills flushing, sweating, or hot feelings joint or muscle aches/pains seizures swelling of the ankles or feet unusually weak or tired Side effects that usually do not require medical attention (report to yourdoctor or health care professional if they continue or are bothersome): diarrhea feeling achy headache irritation at site where injected nausea, vomiting stomach upset tiredness This list may not describe all possible side effects. Call your doctor for medical advice about side effects. You may report side effects to FDA at1-800-FDA-1088. Where should I keep my medication? This drug is given in a hospital or clinic and will not be stored at home. NOTE: This sheet is a summary. It may not cover all possible information. If you have questions about this medicine, talk to your doctor, pharmacist, orhealth care provider.  2022 Elsevier/Gold Standard (2010-10-20 17:14:35)  

## 2020-09-03 ENCOUNTER — Encounter: Payer: Self-pay | Admitting: Oncology

## 2020-09-09 ENCOUNTER — Ambulatory Visit: Payer: Medicare HMO

## 2020-09-15 ENCOUNTER — Inpatient Hospital Stay: Payer: Medicare HMO

## 2020-09-15 VITALS — BP 128/64 | HR 58 | Temp 96.3°F | Resp 16

## 2020-09-15 DIAGNOSIS — D509 Iron deficiency anemia, unspecified: Secondary | ICD-10-CM | POA: Diagnosis not present

## 2020-09-15 DIAGNOSIS — D500 Iron deficiency anemia secondary to blood loss (chronic): Secondary | ICD-10-CM

## 2020-09-15 MED ORDER — SODIUM CHLORIDE 0.9 % IV SOLN
Freq: Once | INTRAVENOUS | Status: AC
Start: 2020-09-15 — End: 2020-09-15
  Filled 2020-09-15: qty 250

## 2020-09-15 MED ORDER — IRON SUCROSE 20 MG/ML IV SOLN
200.0000 mg | Freq: Once | INTRAVENOUS | Status: AC
  Administered 2020-09-15: 200 mg via INTRAVENOUS
  Filled 2020-09-15: qty 10

## 2020-09-15 NOTE — Patient Instructions (Signed)
[facility]  Discharge Instructions: Thank you for choosing [facility] to provide your oncology and hematology care.  If you have a lab appointment with the [facility], please go directly to the [facility] and check in at the registration area.  Wear comfortable clothing and clothing appropriate for easy access to any Portacath or PICC line.   We strive to give you quality time with your provider. You may need to reschedule your appointment if you arrive late (15 or more minutes).  Arriving late affects you and other patients whose appointments are after yours.  Also, if you miss three or more appointments without notifying the office, you may be dismissed from the clinic at the provider's discretion.      For prescription refill requests, have your pharmacy contact our office and allow 72 hours for refills to be completed.    BELOW ARE SYMPTOMS THAT SHOULD BE REPORTED IMMEDIATELY: *FEVER GREATER THAN 100.4 F (38 C) OR HIGHER *CHILLS OR SWEATING *NAUSEA AND VOMITING THAT IS NOT CONTROLLED WITH YOUR NAUSEA MEDICATION *UNUSUAL SHORTNESS OF BREATH *UNUSUAL BRUISING OR BLEEDING *URINARY PROBLEMS (pain or burning when urinating, or frequent urination) *BOWEL PROBLEMS (unusual diarrhea, constipation, pain near the anus) TENDERNESS IN MOUTH AND THROAT WITH OR WITHOUT PRESENCE OF ULCERS (sore throat, sores in mouth, or a toothache) UNUSUAL RASH, SWELLING OR PAIN  UNUSUAL VAGINAL DISCHARGE OR ITCHING   Items with * indicate a potential emergency and should be followed up as soon as possible or go to the Emergency Department if any problems should occur.  Please show the CHEMOTHERAPY ALERT CARD or IMMUNOTHERAPY ALERT CARD at check-in to the Emergency Department and triage nurse.  Should you have questions after your visit or need to cancel or reschedule your appointment, please contact [facility]   [phone] and follow the prompts.  Office hours are 8:00 a.m. to 4:30 p.m. Monday - Friday. Please note that voicemails left after 4:00 p.m. may not be returned until the following business day.  We are closed weekends and major holidays. You have access to a nurse at all times for urgent questions. Please call the main number to the clinic [phone] and follow the prompts.  For any non-urgent questions, you may also contact your provider using MyChart. We now offer e-Visits for anyone 60 and older to request care online for non-urgent symptoms. For details visit [email or web address].   Also download the MyChart app! Go to the app store, search "MyChart", open the app, select [facility], and log in with your MyChart username and password.  Due to Covid, a mask is required upon entering the hospital/clinic. If you do not have a mask, one will be given to you upon arrival. For doctor visits, patients may have 1 support person aged 73 or older with them. For treatment visits, patients cannot have anyone with them due to current Covid guidelines and our immunocompromised population.

## 2020-11-18 ENCOUNTER — Telehealth: Payer: Self-pay | Admitting: *Deleted

## 2020-11-18 NOTE — Telephone Encounter (Signed)
Patient called reporting that he is having light headedness as he does when his iron levels are low and is asking if he can get his labs checked to see if he needs iron infusion His next appointment is not until 11/3. Please advise

## 2020-11-18 NOTE — Telephone Encounter (Signed)
Please move appt as requested by MD. Thanks

## 2020-11-19 ENCOUNTER — Inpatient Hospital Stay: Payer: Medicare HMO | Attending: Oncology

## 2020-11-19 ENCOUNTER — Other Ambulatory Visit: Payer: Self-pay

## 2020-11-19 DIAGNOSIS — Z86711 Personal history of pulmonary embolism: Secondary | ICD-10-CM

## 2020-11-19 DIAGNOSIS — D509 Iron deficiency anemia, unspecified: Secondary | ICD-10-CM | POA: Diagnosis not present

## 2020-11-19 DIAGNOSIS — D500 Iron deficiency anemia secondary to blood loss (chronic): Secondary | ICD-10-CM

## 2020-11-19 LAB — CBC WITH DIFFERENTIAL/PLATELET
Abs Immature Granulocytes: 0.01 10*3/uL (ref 0.00–0.07)
Basophils Absolute: 0 10*3/uL (ref 0.0–0.1)
Basophils Relative: 1 %
Eosinophils Absolute: 0.1 10*3/uL (ref 0.0–0.5)
Eosinophils Relative: 1 %
HCT: 45.7 % (ref 39.0–52.0)
Hemoglobin: 15.1 g/dL (ref 13.0–17.0)
Immature Granulocytes: 0 %
Lymphocytes Relative: 30 %
Lymphs Abs: 1.7 10*3/uL (ref 0.7–4.0)
MCH: 32.1 pg (ref 26.0–34.0)
MCHC: 33 g/dL (ref 30.0–36.0)
MCV: 97.2 fL (ref 80.0–100.0)
Monocytes Absolute: 0.5 10*3/uL (ref 0.1–1.0)
Monocytes Relative: 9 %
Neutro Abs: 3.3 10*3/uL (ref 1.7–7.7)
Neutrophils Relative %: 59 %
Platelets: 228 10*3/uL (ref 150–400)
RBC: 4.7 MIL/uL (ref 4.22–5.81)
RDW: 13.9 % (ref 11.5–15.5)
WBC: 5.7 10*3/uL (ref 4.0–10.5)
nRBC: 0 % (ref 0.0–0.2)

## 2020-11-19 LAB — FERRITIN: Ferritin: 107 ng/mL (ref 24–336)

## 2020-11-19 LAB — IRON AND TIBC
Iron: 121 ug/dL (ref 45–182)
Saturation Ratios: 41 % — ABNORMAL HIGH (ref 17.9–39.5)
TIBC: 297 ug/dL (ref 250–450)
UIBC: 176 ug/dL

## 2020-11-25 ENCOUNTER — Inpatient Hospital Stay: Payer: Medicare HMO

## 2020-11-29 ENCOUNTER — Inpatient Hospital Stay: Payer: Medicare HMO

## 2020-11-29 ENCOUNTER — Other Ambulatory Visit: Payer: Self-pay

## 2020-11-29 ENCOUNTER — Inpatient Hospital Stay: Payer: Medicare HMO | Attending: Oncology | Admitting: Oncology

## 2020-11-29 ENCOUNTER — Encounter: Payer: Self-pay | Admitting: Oncology

## 2020-11-29 VITALS — BP 104/72 | HR 57 | Temp 97.8°F | Resp 18 | Wt 174.8 lb

## 2020-11-29 DIAGNOSIS — Z86711 Personal history of pulmonary embolism: Secondary | ICD-10-CM | POA: Insufficient documentation

## 2020-11-29 DIAGNOSIS — R0602 Shortness of breath: Secondary | ICD-10-CM | POA: Diagnosis not present

## 2020-11-29 DIAGNOSIS — D509 Iron deficiency anemia, unspecified: Secondary | ICD-10-CM | POA: Diagnosis not present

## 2020-11-29 DIAGNOSIS — Z7901 Long term (current) use of anticoagulants: Secondary | ICD-10-CM | POA: Insufficient documentation

## 2020-11-29 DIAGNOSIS — Z862 Personal history of diseases of the blood and blood-forming organs and certain disorders involving the immune mechanism: Secondary | ICD-10-CM

## 2020-11-29 NOTE — Progress Notes (Signed)
[facility]  Follow Up Visit:  Patient Care Team: Baxter Hire, MD as PCP - General (Internal Medicine) Rockey Situ [name], MD as PCP - Cardiology (Cardiology) Madelyn Brunner, MD (Internal Medicine) Alisa Graff, FNP as Nurse Practitioner (Cardiology) Minna Merritts, MD as Consulting Physician (Cardiology)  REASON FOR VISIT Follow up for iron deficiency anemia  HISTORY OF PRESENTING ILLNESS: Scott [name] 80 y.o. male with PMH listed as below who is referred to me for evaluation and management of pulmonary embolism.  Scott [name] has a history paroxymal atrial fibrillation on Eliquis 5mg  BID until recently, history of CAD s/p CABG x 2 at [facility] in 2016 with chronic chest pain, s/p removal of sternal wire on 03/2016,  s/p sternal debridement and plating on 05/25/2016 in the setting of CT chest performed by pain management showing sternal nonunion of the manubrium with subsequent improvement in his pain. Perioperatively his anticoagulation with Eliquis was discontinue a few days prior surgery and resumed after surgery. Operation note mentioned that during the procedure, the left lung was densely adherent to the underside of the sternum and tear in the lung did occur and was sutured. He presents  to [facility] on 08/04/2016 for evaluation of SOB and was found to have a PE. Patient states that he was compliant with Eliquis.  CTA chest showed left upper lobe PE extending into the left main pulmonary artery. His Eliquis was changed to Xarelto. Lower extremity dopplers were negative for DVT. Hypercoagulable workup was sent and all tests were negative except a mild low protein S function.  No formal cancer workup.  Sed rate normal, heavy metals negative, TSH mildly reduced at 0.253. 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     Hypercoagulable work up: discussed with patient. He is tested negative for factor 5  leiden mutation, prothrombin mutation, anti phospholipin antibody panels. Normal protein C activity. Normal total protein S, mildly low protein S function, can be falsely low in the acute setting of thrombosis.Congential protein S deficiency is unlikely given that he has no prior history of VTE. Repeat protein S total and free were normal.   # He has had one dose of Feraheme and during the treatment he had a chest tightness and end of infusion. Has received IV iron Venofer and tolerates well.   #chronic intermittent chest pain and shortness of breath that has been previously extensively worked up.  He follows up with Dr. Nyoka Cowden for his heart care.  Extensive cardiology disease CAD status post prior RCA and LAD stenting as well as CABG x2 chronic chest pain status post sternal wire removal with sternal debridement and plating, ischemia cardiomyopathy, chronic dyspnea.  # CT angios chest PE protocol done on 11/07/2016 showed no lung nodules or mass.  INTERVAL HISTORY Patient presents for follow-up of chronic anticoagulation, iron deficiency present for follow up .  Denies hematochezia, hematuria, hematemesis, epistaxis, black tarry stool  He feels more SOB lately.  On Eliquis 2.5mg  BID.   Review of Systems  Constitutional:  Positive for fatigue. Negative for appetite change, chills, fever and unexpected weight change.  HENT:   Negative for hearing loss and voice change.   Eyes:  Negative for eye problems and icterus.  Respiratory:  Positive for shortness of breath. Negative for chest tightness and cough.   Cardiovascular:  Negative for chest pain and leg swelling.  Gastrointestinal:  Negative for  abdominal distention and abdominal pain.  Endocrine: Negative for hot flashes.  Genitourinary:  Negative for difficulty urinating, dysuria and frequency.   Musculoskeletal:  Negative for arthralgias.  Skin:  Negative for itching and rash.  Neurological:  Negative for light-headedness and numbness.   Hematological:  Negative for adenopathy. Does not bruise/bleed easily.  Psychiatric/Behavioral:  Negative for confusion.    MEDICAL HISTORY: Past Medical History:  Diagnosis Date   Arthritis    right hip, since a fall   CAD (coronary artery disease)    a. 04/2012 Cath: 3VD->Med Rx;  b. 04/2013 PCI RCA (2 DES); c. 03/2014 PCI: LAD 80 (3.0x23 Xience Alpine DES); d. 06/2014 CABG x 2 ([facility]) LIMA->LAD, VG->OM; e. 12/2014 Cath ([facility]): patent grafts->Med Rx; f. 08/2015 Cath: patent grafts; g. 11/2016 Cath: LM min irregs, LAD 20p, 17m, LCX 100p/m, RCA 20ost, 10p/m/d, VG->OM1 nl, LIMA->dLAD nl; f. 07/2017 MV: No isch, EF 51%.   Cardiomyopathy, ischemic    a. 04/2012 Echo: EF 40-45%;  b. 08/2013 Echo: EF 45-50%; c. 01/2015 Echo: EF 40-45%; d. 07/2016 Echo: EF 60-65%; e. 09/2017 Echo: EF 55-60%, Gr1 DD.   Carotid arterial disease ([place])    a. 05/2013 Carotid U/S; bilat 40-50% ICA stenosis.   Chronic Chest Pain    USES NITRO   Chronic systolic CHF (congestive heart failure) ([place])    a. 01/2015 Echo: EF 40-45%; b. 07/2016 Echo: EF 60-65%; c. 09/2017 Echo: EF 55-60%, Gr1 DD, nl RV fxn.   Cough    Diverticulosis    Dizziness    Dyspnea    WITH EXERTION   Headache 1970's   migraine   History of blood transfusion 2016   post op   Hyperlipidemia    Hypertension    Iron deficiency anemia due to chronic blood loss 09/11/2016   Myocardial infarction [facility])    unsure of when   Pulmonary emboli ([place]) 07/2016   a. On Xarelto.   Reflux esophagitis    Sternal pain    a. 03/2016 s/p Sternal wire removal; b. 05/2016 s/p redo median sternotomy for sternal debridement and sternal plating.   Syncope 04/2018   CARDIOLOGIST WAS DR Rockey Situ AND HE WAS AWARE-NOW PT SEES PARASCHOS   Tubular adenoma of colon     SURGICAL HISTORY: Past Surgical History:  Procedure Laterality Date   CARDIAC CATHETERIZATION  05/01/2012   CARDIAC CATHETERIZATION  04/2013   [facility];x3 stent   CARDIAC CATHETERIZATION  01/11/15    [facility]   CARDIAC  CATHETERIZATION N/A 09/16/2015   Procedure: LEFT HEART CATH AND CORS/GRAFTS ANGIOGRAPHY;  Surgeon: Minna Merritts, MD;  Location: [place] [facility];  Service: Cardiovascular;  Laterality: N/A;   CARPAL TUNNEL RELEASE     right hand   CATARACT EXTRACTION     LEFT   CATARACT EXTRACTION W/PHACO Left 09/16/2014   Procedure: CATARACT EXTRACTION PHACO AND INTRAOCULAR LENS PLACEMENT (IOC);  Surgeon: Leandrew Koyanagi, MD;  Location: [place];  Service: Ophthalmology;  Laterality: Left;   COLONOSCOPY     COLONOSCOPY WITH PROPOFOL N/A 01/28/2016   Procedure: COLONOSCOPY WITH PROPOFOL;  Surgeon: Lollie Sails, MD;  Location: [facility] ENDOSCOPY;  Service: Endoscopy;  Laterality: N/A;   CORONARY ANGIOPLASTY WITH STENT PLACEMENT  04/13/2014   CORONARY ARTERY BYPASS GRAFT  06-26-14   x3 bypasses   CORONARY PRESSURE WIRE/FFR WITH 3D MAPPING N/A 12/10/2018   Procedure: Suzette Battiest PRESSURE WIRE/FFR STUDY;  Surgeon: Isaias Cowman, MD;  Location: [place] [facility];  Service: Cardiovascular;  Laterality: N/A;  CYSTOSCOPY N/A 08/14/2018   Procedure: CYSTOSCOPY;  Surgeon: Abbie Sons, MD;  Location: [facility] ORS;  Service: Urology;  Laterality: N/A;   DE QUERVAIN'S RELEASE Left 08/22/2012   ESOPHAGOGASTRODUODENOSCOPY (EGD) WITH PROPOFOL N/A 04/26/2015   Procedure: ESOPHAGOGASTRODUODENOSCOPY (EGD) WITH PROPOFOL;  Surgeon: Hulen Luster, MD;  Location: [facility];  Service: Gastroenterology;  Laterality: N/A;   ESOPHAGOGASTRODUODENOSCOPY (EGD) WITH PROPOFOL N/A 05/29/2017   Procedure: ESOPHAGOGASTRODUODENOSCOPY (EGD) WITH PROPOFOL;  Surgeon: Lollie Sails, MD;  Location: [facility] ENDOSCOPY;  Service: Endoscopy;  Laterality: N/A;   INTRAVASCULAR ULTRASOUND/IVUS N/A 12/10/2018   Procedure: Intravascular Ultrasound/IVUS;  Surgeon: Isaias Cowman, MD;  Location: [place] [facility];  Service: Cardiovascular;  Laterality: N/A;   LEFT HEART CATH AND CORONARY ANGIOGRAPHY N/A 12/04/2016    Procedure: LEFT HEART CATH AND CORS/GRAFTS  ANGIOGRAPHY;  Surgeon: [place] Hampshire, MD;  Location: [place] [facility];  Service: Cardiovascular;  Laterality: N/A;   LEFT HEART CATH AND CORS/GRAFTS ANGIOGRAPHY N/A 04/09/2018   Procedure: LEFT HEART CATH AND CORS/GRAFTS ANGIOGRAPHY;  Surgeon: Isaias Cowman, MD;  Location: [place] [facility];  Service: Cardiovascular;  Laterality: N/A;   LEFT HEART CATH AND CORS/GRAFTS ANGIOGRAPHY N/A 12/10/2018   Procedure: LEFT HEART CATH AND CORS/GRAFTS ANGIOGRAPHY;  Surgeon: Isaias Cowman, MD;  Location: [place] [facility];  Service: Cardiovascular;  Laterality: N/A;   LEFT HEART CATH AND CORS/GRAFTS ANGIOGRAPHY Left 05/25/2020   Procedure: LEFT HEART CATH AND CORS/GRAFTS ANGIOGRAPHY;  Surgeon: Isaias Cowman, MD;  Location: [place] [facility];  Service: Cardiovascular;  Laterality: Left;   RIB PLATING N/A 05/25/2016   Procedure: STERNAL PLATING;  Surgeon: Melrose Nakayama, MD;  Location: [place];  Service: Thoracic;  Laterality: N/A;   right shoulder     STERNAL WIRES REMOVAL N/A 03/30/2016   Procedure: STERNAL WIRES REMOVAL;  Surgeon: [place] Nakayama, MD;  Location: [facility] OR;  Service: Thoracic;  Laterality: N/A;   TONSILLECTOMY      SOCIAL HISTORY: Social History   Socioeconomic History   Marital status: Married    Spouse name: Not on file   Number of children: Not on file   Years of education: Not on file   Highest education level: Not on file  Occupational History   Not on file  Tobacco Use   Smoking status: Former    Packs/day: 1.00    Years: 35.00    Pack years: 35.00    Types: Cigarettes    Quit date: 2009    Years since quitting: 13.8   Smokeless tobacco: Former  Scientific laboratory technician Use: Never used  Substance and Sexual Activity   Alcohol use: No   Drug use: No   Sexual activity: Yes  Other Topics Concern   Not on file  Social History Narrative   Not on file   Social Determinants of Health    Financial Resource Strain: Not on file  Food Insecurity: Not on file  Transportation Needs: Not on file  Physical Activity: Not on file  Stress: Not on file  Social Connections: Not on file  Intimate Partner Violence: Not on file    FAMILY HISTORY Family History  Problem Relation Age of Onset   Heart attack Father 62       MI   Cancer Maternal Uncle     ALLERGIES:  is allergic to contrast media [iodinated diagnostic agents], metrizamide, oxycodone hcl, and vicodin [hydrocodone-acetaminophen].  MEDICATIONS:  Current Outpatient Medications  Medication Sig Dispense Refill   atorvastatin (  LIPITOR) 80 MG tablet Take 1 tablet (80 mg total) by mouth daily at 6 PM. 30 tablet 0   ELIQUIS 2.5 MG TABS tablet Take 2.5 mg by mouth 2 (two) times daily.     ezetimibe (ZETIA) 10 MG tablet Take 1 tablet (10 mg total) by mouth daily. (Patient taking differently: Take 10 mg by mouth at bedtime.) 90 tablet 3   isosorbide mononitrate (IMDUR) 60 MG 24 hr tablet Take 60 mg by mouth 2 (two) times daily.      metoprolol succinate (TOPROL-XL) 25 MG 24 hr tablet Take 25 mg by mouth every evening.     nitroGLYCERIN (NITROSTAT) 0.4 MG SL tablet Place 1 tablet (0.4 mg total) under the tongue every 5 (five) minutes as needed for chest pain. 25 tablet 6   ranolazine (RANEXA) 1000 MG SR tablet Take 1 tablet (1,000 mg total) by mouth 2 (two) times daily. 180 tablet 3   Current Facility-Administered Medications  Medication Dose Route Frequency Provider Last Rate Last Admin   lidocaine (XYLOCAINE) 2 % jelly 1 application  1 application Urethral Once Stoioff, Scott C, MD        PHYSICAL EXAMINATION:  ECOG PERFORMANCE STATUS: 1 - Symptomatic but completely ambulatory   Vitals:   11/29/20 1308  BP: 104/72  Pulse: (!) 57  Resp: 18  Temp: 97.8 F (36.6 C)  SpO2: 98%    Filed Weights   11/29/20 1308  Weight: 174 lb 12.8 oz (79.3 kg)     Physical Exam Constitutional:      General: He is not in  acute distress.    Appearance: He is not diaphoretic.  HENT:     Head: Normocephalic and atraumatic.     Nose: Nose normal.     Mouth/Throat:     Pharynx: No oropharyngeal exudate.  Eyes:     General: No scleral icterus.    Conjunctiva/sclera: Conjunctivae normal.     Pupils: Pupils are equal, round, and reactive to light.  Cardiovascular:     Rate and Rhythm: Normal rate and regular rhythm.     Heart sounds: No murmur heard. Pulmonary:     Effort: Pulmonary effort is normal. No respiratory distress.     Breath sounds: Normal breath sounds.  Abdominal:     General: Bowel sounds are normal. There is no distension.     Palpations: Abdomen is soft.     Tenderness: There is no abdominal tenderness.  Musculoskeletal:        General: Normal range of motion.     Cervical back: Normal range of motion and neck supple.  Skin:    General: Skin is warm and dry.     Findings: No erythema.  Neurological:     Mental Status: He is alert and oriented to person, place, and time.     Cranial Nerves: No cranial nerve deficit.     Motor: No abnormal muscle tone.     Coordination: Coordination normal.  Psychiatric:        Mood and Affect: Affect normal.        LABORATORY DATA: I have personally reviewed the data as listed:  Appointment on 11/19/2020  Component Date Value Ref Range Status   WBC 11/19/2020 5.7  4.0 - 10.5 K/uL Final   RBC 11/19/2020 4.70  4.22 - 5.81 MIL/uL Final   Hemoglobin 11/19/2020 15.1  13.0 - 17.0 g/dL Final   HCT 11/19/2020 45.7  39.0 - 52.0 % Final   MCV 11/19/2020 97.2  80.0 - 100.0 fL Final   MCH 11/19/2020 32.1  26.0 - 34.0 pg Final   MCHC 11/19/2020 33.0  30.0 - 36.0 g/dL Final   RDW 11/19/2020 13.9  11.5 - 15.5 % Final   Platelets 11/19/2020 228  150 - 400 K/uL Final   nRBC 11/19/2020 0.0  0.0 - 0.2 % Final   Neutrophils Relative % 11/19/2020 59  % Final   Neutro Abs 11/19/2020 3.3  1.7 - 7.7 K/uL Final   Lymphocytes Relative 11/19/2020 30  % Final    Lymphs Abs 11/19/2020 1.7  0.7 - 4.0 K/uL Final   Monocytes Relative 11/19/2020 9  % Final   Monocytes Absolute 11/19/2020 0.5  0.1 - 1.0 K/uL Final   Eosinophils Relative 11/19/2020 1  % Final   Eosinophils Absolute 11/19/2020 0.1  0.0 - 0.5 K/uL Final   Basophils Relative 11/19/2020 1  % Final   Basophils Absolute 11/19/2020 0.0  0.0 - 0.1 K/uL Final   Immature Granulocytes 11/19/2020 0  % Final   Abs Immature Granulocytes 11/19/2020 0.01  0.00 - 0.07 K/uL Final   Performed at [facility], [address] [place], [place] [address]   Iron 11/19/2020 121  45 - 182 ug/dL Final   TIBC 11/19/2020 297  250 - 450 ug/dL Final   Saturation Ratios 11/19/2020 41 (A)  17.9 - 39.5 % Final   UIBC 11/19/2020 176  ug/dL Final   Performed at [facility], [address] [place], [place] [address]   Ferritin 11/19/2020 107  24 - 336 ng/mL Final   Performed at [facility], [address] [place], [place] [address]   Lab Results  Component Value Date   IRON 121 11/19/2020   TIBC 297 11/19/2020   IRONPCTSAT 41 (H) 11/19/2020   Lab Results  Component Value Date   FERRITIN 107 11/19/2020    RADIOGRAPHIC STUDIES: I have personally reviewed the radiological images as listed and agree with the findings in the report No results found.   ASSESSMENT/PLAN 1. History of iron deficiency anemia   2. SOB (shortness of breath) on exertion   3. History of pulmonary embolism    # History of iron deficiency anemia.  Labs are reviewed and discussed with patient. Stable iron panel and hemoglobin level.  No need for IV venofer at this point.   History of PE on chronic anticoagulation with Eliquis 2.5mg  BID.  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 Recommend him to make a follow up appointment with pulmonology Dr.Aleskerov.    # Follow up with me in  6 months  with   CBC, iron TIBC, ferritin repeat prior to the visit.   Earlie Server, MD, PhD Hematology Oncology  11/29/2020

## 2021-02-09 DIAGNOSIS — I495 Sick sinus syndrome: Secondary | ICD-10-CM | POA: Insufficient documentation

## 2021-03-30 ENCOUNTER — Other Ambulatory Visit
Admission: RE | Admit: 2021-03-30 | Discharge: 2021-03-30 | Disposition: A | Discharge status: Home / Self Care | Payer: Medicare HMO | Source: Ambulatory Visit | Attending: Physician Assistant | Admitting: Physician Assistant

## 2021-03-30 DIAGNOSIS — R0789 Other chest pain: Secondary | ICD-10-CM | POA: Insufficient documentation

## 2021-03-30 LAB — TROPONIN I (HIGH SENSITIVITY): Troponin I (High Sensitivity): 12 ng/L (ref <18)

## 2021-05-20 ENCOUNTER — Ambulatory Visit
Admission: EM | Admit: 2021-05-20 | Discharge: 2021-05-20 | Disposition: A | Discharge status: Home / Self Care | Payer: Medicare HMO | Attending: Family Medicine | Admitting: Family Medicine

## 2021-05-20 ENCOUNTER — Ambulatory Visit (INDEPENDENT_AMBULATORY_CARE_PROVIDER_SITE_OTHER): Payer: Medicare HMO

## 2021-05-20 ENCOUNTER — Encounter: Payer: Self-pay | Admitting: Emergency Medicine

## 2021-05-20 DIAGNOSIS — R0602 Shortness of breath: Secondary | ICD-10-CM

## 2021-05-20 DIAGNOSIS — I208 Other forms of angina pectoris: Secondary | ICD-10-CM | POA: Diagnosis not present

## 2021-05-20 DIAGNOSIS — R0609 Other forms of dyspnea: Secondary | ICD-10-CM

## 2021-05-20 DIAGNOSIS — J441 Chronic obstructive pulmonary disease with (acute) exacerbation: Secondary | ICD-10-CM

## 2021-05-20 MED ORDER — DOXYCYCLINE HYCLATE 100 MG PO CAPS: 100.0000 mg | ORAL_CAPSULE | Freq: Two times a day (BID) | ORAL | 0 refills | Status: AC

## 2021-05-20 MED ORDER — IPRATROPIUM-ALBUTEROL 0.5-2.5 (3) MG/3ML IN SOLN: 3.0000 mL | Freq: Four times a day (QID) | RESPIRATORY_TRACT | 0 refills | Status: DC | PRN

## 2021-05-20 MED ORDER — PREDNISONE 20 MG PO TABS: 20.0000 mg | ORAL_TABLET | Freq: Every day | ORAL | 0 refills | Status: AC

## 2021-05-20 MED ORDER — BENZONATATE 100 MG PO CAPS: 100.0000 mg | ORAL_CAPSULE | Freq: Three times a day (TID) | ORAL | 0 refills | Status: DC

## 2021-05-20 NOTE — ED Triage Notes (Signed)
Pt here with a cough, dizziness, headache, and chest pain x 6 days.  ?

## 2021-05-20 NOTE — Discharge Instructions (Addendum)
Give nebulizer treatment every 6 hours as needed for wheezing and persistent coughing and or shortness of breath.  Complete entire course of prednisone and antibiotic.  I prescribed you benzonatate Perles this is for cough you can take this up to 3 times a day as needed for cough.  If at any point any of your symptoms worsen or you have worsening of shortness of breath, chest pain, or worsening of dizziness go immediately to the emergency department. ?Your x-ray was negative for pneumonia. ?

## 2021-05-20 NOTE — ED Provider Notes (Addendum)
Renaldo Fiddler    CSN: [id number] Arrival date & time: 05/20/21  1412      History   Chief Complaint Chief Complaint  Patient presents with   Chest Pain   Headache   Cough   Dizziness    HPI ADRICK SCHNEIDERS is a 81 y.o. male.   HPI Patient presents for evaluation of cough, dizziness, headache, and chest pain x 6 days.  Patient's spouse is assisting with history.  Patient reports he has had chronic shortness of breath and chest pain for quite some time.  He was last seen by his cardiologist on 05/10/21  and has had an extensive cardiac work-up.  He suffers from chronic angina.  Although denies he has not had to take any nitroglycerin since last week.  He is mostly concerned about his persistent cough which is occasionally productive of mucus.  He denies any known sick contacts.  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 He has had wheezing since cough onset along with runny nose and nasal congestion. He initially told the nurse that he was having chest pain but further clarified that he has had chest pain for a long time but not having any chest pain today and has not needed any nitroglycerin in greater than a week. Past Medical History:  Diagnosis Date   Arthritis    right hip, since a fall   CAD (coronary artery disease)    a. 04/2012 Cath: 3VD->Med Rx;  b. 04/2013 PCI RCA (2 DES); c. 03/2014 PCI: LAD 80 (3.0x23 Xience Alpine DES); d. 06/2014 CABG x 2 ([facility]) LIMA->LAD, VG->OM; e. 12/2014 Cath ([facility]): patent grafts->Med Rx; f. 08/2015 Cath: patent grafts; g. 11/2016 Cath: LM min irregs, LAD 20p, 30m, LCX 100p/m, RCA 20ost, 10p/m/d, VG->OM1 nl, LIMA->dLAD nl; f. 07/2017 MV: No isch, EF 51%.   Cardiomyopathy, ischemic    a. 04/2012 Echo: EF 40-45%;  b. 08/2013 Echo: EF 45-50%; c. 01/2015 Echo: EF 40-45%; d. 07/2016 Echo: EF 60-65%; e. 09/2017 Echo: EF 55-60%, Gr1 DD.   Carotid arterial disease (HCC)     a. 05/2013 Carotid U/S; bilat 40-50% ICA stenosis.   Chronic Chest Pain    USES NITRO   Chronic systolic CHF (congestive heart failure) (HCC)    a. 01/2015 Echo: EF 40-45%; b. 07/2016 Echo: EF 60-65%; c. 09/2017 Echo: EF 55-60%, Gr1 DD, nl RV fxn.   Cough    Diverticulosis    Dizziness    Dyspnea    WITH EXERTION   Headache 1970's   migraine   History of blood transfusion 2016   post op   Hyperlipidemia    Hypertension    Iron deficiency anemia due to chronic blood loss 09/11/2016   Myocardial infarction [facility])    unsure of when   Pulmonary emboli (HCC) 07/2016   a. On Xarelto.   Reflux esophagitis    Sternal pain    a. 03/2016 s/p Sternal wire removal; b. 05/2016 s/p redo median sternotomy for sternal debridement and sternal plating.   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   Iron deficiency anemia due to chronic blood loss 09/11/2016   Pulmonary embolus (HCC) 08/31/2016   History of pulmonary embolism 08/16/2016   Protein S deficiency (HCC) 08/16/2016   Panic disorder    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   Panic attack 08/04/2016   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   Coronary artery disease involving native coronary artery of native heart with angina pectoris with documented spasm (HCC)    Hx of CABG    Ischemic cardiomyopathy    Chronic systolic CHF (congestive heart failure) (HCC)    Chronic Chest Pain    Coronary artery disease involving  coronary bypass graft of native heart with angina pectoris with documented spasm (HCC)    Angina pectoris (HCC)    Anxiety    Hyperlipemia    Chronic diastolic heart failure (HCC) 12/21/2014   Unstable angina (HCC) 12/08/2014   PAF (paroxysmal atrial fibrillation) (HCC) 12/08/2014   Chronic anticoagulation 12/08/2014   Anemia 04/20/2014   Osteoarthritis, 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   Cardiomyopathy, ischemic    Essential hypertension    Bilateral carotid artery disease (HCC)    S/P drug eluting coronary stent placement 05/30/2013   Chest pain with low risk for cardiac etiology 05/05/2013   SOB (shortness of breath) 05/05/2013   Coronary artery disease involving coronary bypass graft of native heart with angina pectoris (HCC) 05/05/2013   Hyperlipidemia 05/05/2013   History of smoking 05/05/2013   Cardiac angina (HCC) 05/05/2013    Past Surgical History:  Procedure Laterality Date   CARDIAC CATHETERIZATION  05/01/2012   CARDIAC CATHETERIZATION  04/2013   [facility];x3 stent   CARDIAC CATHETERIZATION  01/11/15    [facility]   CARDIAC CATHETERIZATION N/A 09/16/2015   Procedure: LEFT HEART CATH AND CORS/GRAFTS ANGIOGRAPHY;  Surgeon: Antonieta Iba, MD;  Location: [facility] INVASIVE CV LAB;  Service: Cardiovascular;  Laterality: N/A;   CARPAL TUNNEL RELEASE     right hand   CATARACT EXTRACTION     LEFT   CATARACT EXTRACTION W/PHACO Left 09/16/2014   Procedure: CATARACT EXTRACTION PHACO AND INTRAOCULAR LENS PLACEMENT (IOC);  Surgeon: Lockie Mola, MD;  Location: [facility];  Service: Ophthalmology;  Laterality: Left;   COLONOSCOPY     COLONOSCOPY WITH PROPOFOL N/A 01/28/2016   Procedure: COLONOSCOPY WITH PROPOFOL;  Surgeon: Christena Deem, MD;  Location: [facility] ENDOSCOPY;  Service: Endoscopy;  Laterality: N/A;   CORONARY ANGIOPLASTY WITH STENT PLACEMENT  04/13/2014   CORONARY ARTERY BYPASS GRAFT  06-26-14   x3 bypasses   CORONARY PRESSURE WIRE/FFR WITH 3D MAPPING N/A 12/10/2018   Procedure:  Mellody Dance PRESSURE WIRE/FFR STUDY;  Surgeon: Marcina Millard, MD;  Location: [facility] INV[facility] CV LAB;  Service: Cardiovascular;  Laterality: N/A;   CYSTOSCOPY N/A 08/14/2018   Procedure: CYSTOSCOPY;  Surgeon: Riki Altes, MD;  Location: [facility] ORS;  Service: Urology;  Laterality: N/A;   DE QUERVAIN'S RELEASE Left 08/22/2012   ESOPHAGOGASTRODUODENOSCOPY (EGD) WITH PROPOFOL N/A 04/26/2015   Procedure: ESOPHAGOGASTRODUODENOSCOPY (EGD) WITH PROPOFOL;  Surgeon: Wallace Cullens, MD;  Location: [facility] ENDOSCOPY;  Service: Gastroenterology;  Laterality: N/A;   ESOPHAGOGASTRODUODENOSCOPY (EGD) WITH PROPOFOL N/A 05/29/2017   Procedure: ESOPHAGOGASTRODUODENOSCOPY (EGD) WITH PROPOFOL;  Surgeon: Christena Deem, MD;  Location: [facility] ENDOSCOPY;  Service: Endoscopy;  Laterality: N/A;   INTRAVASCULAR ULTRASOUND/IVUS N/A 12/10/2018   Procedure: Intravascular Ultrasound/IVUS;  Surgeon: Marcina Millard, MD;  Location: [facility] CV LAB;  Service: Cardiovascular;  Laterality: N/A;   LEFT HEART CATH AND CORONARY ANGIOGRAPHY N/A 12/04/2016   Procedure: LEFT HEART CATH AND CORS/GRAFTS  ANGIOGRAPHY;  Surgeon: Iran Ouch, MD;  Location: [facility];  Service: Cardiovascular;  Laterality: N/A;   LEFT HEART CATH AND CORS/GRAFTS ANGIOGRAPHY N/A 04/09/2018   Procedure: LEFT HEART CATH AND CORS/GRAFTS ANGIOGRAPHY;  Surgeon: Marcina Millard, MD;  Location: [facility];  Service: Cardiovascular;  Laterality: N/A;   LEFT HEART CATH AND CORS/GRAFTS ANGIOGRAPHY N/A 12/10/2018   Procedure: LEFT HEART CATH AND CORS/GRAFTS ANGIOGRAPHY;  Surgeon: Marcina Millard, MD;  Location: [facility];  Service: Cardiovascular;  Laterality: N/A;   LEFT HEART CATH AND CORS/GRAFTS ANGIOGRAPHY Left 05/25/2020   Procedure: LEFT HEART CATH AND CORS/GRAFTS ANGIOGRAPHY;  Surgeon: Marcina Millard, MD;  Location: [facility] CV LAB;  Service: Cardiovascular;  Laterality: Left;   RIB PLATING N/A 05/25/2016    Procedure: STERNAL PLATING;  Surgeon: Loreli Slot, MD;  Location[facility] OR;  Service: Thoracic;  Laterality: N/A;   right shoulder     STERNAL WIRES REMOVAL N/A 03/30/2016   Procedure: STERNAL WIRES REMOVAL;  Surgeon: Loreli Slot, MD;  Location: [facility] OR;  Service: Thoracic;  Laterality: N/A;   TONSILLECTOMY         Home Medications    Prior to Admission medications   Medication Sig Start Date End Date Taking? Authorizing Provider  benzonatate (TESSALON) 100 MG capsule Take 1 capsule (100 mg total) by mouth every 8 (eight) hours. 05/20/21  Yes Bing Neighbors, FNP  doxycycline (VIBRAMYCIN) 100 MG capsule Take 1 capsule (100 mg total) by mouth 2 (two) times daily for 7 days. 05/20/21 05/27/21 Yes Bing Neighbors, FNP  ipratropium-albuterol (DUONEB) 0.5-2.5 (3) MG/3ML SOLN Take 3 mLs by nebulization every 6 (six) hours as needed. 05/20/21  Yes Bing Neighbors, FNP  predniSONE (DELTASONE) 20 MG tablet Take 1 tablet (20 mg total) by mouth daily with breakfast for 5 days. 05/20/21 05/25/21 Yes Bing Neighbors, FNP  atorvastatin (LIPITOR) 80 MG tablet Take 1 tablet (80 mg total) by mouth daily at 6 PM. 10/04/17   Gouru, Aruna, MD  ELIQUIS 2.5 MG TABS tablet Take 2.5 mg by mouth 2 (two) times daily. 03/19/19   [provider]  ezetimibe (ZETIA) 10 MG tablet Take 1 tablet (10 mg total) by mouth daily. Patient taking differently: Take 10 mg by mouth at bedtime. 12/07/17   Antonieta Iba, MD  isosorbide mononitrate (IMDUR) 60 MG 24 hr tablet Take 60 mg by mouth 2 (two) times daily.     [provider]  metoprolol succinate (TOPROL-XL) 25 MG 24 hr tablet Take 25 mg by mouth every evening. 03/18/19   [provider]  nitroGLYCERIN (NITROSTAT) 0.4 MG SL tablet Place 1 tablet (0.4 mg total) under the tongue every 5 (five) minutes as needed for chest pain. 12/07/17   Antonieta Iba, MD  ranolazine (RANEXA) 1000 MG SR tablet Take 1 tablet (1,000 mg total) by mouth  2 (two) times daily. 12/07/17   Antonieta Iba, MD    Family History Family History  Problem Relation Age of Onset   Heart attack Father 75       MI   Cancer Maternal Uncle     Social History Social History   Tobacco Use   Smoking status: Former    Packs/day: 1.00  Years: 35.00    Pack years: 35.00    Types: Cigarettes    Quit date: 2009    Years since quitting: 14.3   Smokeless tobacco: Former  Building services engineer Use: Never used  Substance Use Topics   Alcohol use: No   Drug use: No     Allergies   Contrast media [iodinated contrast media], Metrizamide, Oxycodone hcl, and Vicodin [hydrocodone-acetaminophen]   Review of Systems Review of Systems Pertinent negatives listed in HPI  Physical Exam Triage Vital Signs ED Triage Vitals  Enc Vitals Group     BP 05/20/21 1428 112/67     Pulse Rate 05/20/21 1428 (!) 59     Resp 05/20/21 1428 (!) 24     Temp 05/20/21 1428 97.7 F (36.5 C)     Temp src --      SpO2 05/20/21 1428 96 %     Weight --      Height --      Head Circumference --      Peak Flow --      Pain Score 05/20/21 1417 4     Pain Loc --      Pain Edu? --      Excl. in GC? --    No data found.  Updated Vital Signs BP 112/67   Pulse (!) 59   Temp 97.7 F (36.5 C)   Resp (!) 24   SpO2 96%   Visual Acuity Right Eye Distance:   Left Eye Distance:   Bilateral Distance:    Right Eye Near:   Left Eye Near:    Bilateral Near:     Physical Exam Constitutional:      Appearance: He is well-developed. He is ill-appearing.  HENT:     Head: Normocephalic and atraumatic.  Eyes:     Extraocular Movements: Extraocular movements intact.     Pupils: Pupils are equal, round, and reactive to light.  Cardiovascular:     Rate and Rhythm: Regular rhythm. Bradycardia present.  Pulmonary:     Breath sounds: Examination of the right-upper field reveals wheezing. Examination of the left-upper field reveals wheezing. Examination of the right-middle  field reveals wheezing. Examination of the left-middle field reveals wheezing. Wheezing and rhonchi present. No rales.  Musculoskeletal:        General: Normal range of motion.     Cervical back: Normal range of motion and neck supple.  Skin:    General: Skin is warm and dry.  Neurological:     Mental Status: He is alert and oriented to person, place, and time.     GCS: GCS eye subscore is 4. GCS verbal subscore is 5. GCS motor subscore is 6.  Psychiatric:        Mood and Affect: Mood normal.        Speech: Speech normal.     UC Treatments / Results  Labs (all labs ordered are listed, but only abnormal results are displayed) Labs Reviewed - No data to display  EKG   Radiology DG Chest 2 View  Result Date: 05/20/2021 CLINICAL DATA:  CHF. Cardiomyopathy. Cough and shortness of breath for 6 days. Ex-smoker. EXAM: CHEST - 2 VIEW COMPARISON:  07/23/2020 chest CT. Most recent plain film of 05/15/2019 FINDINGS: median sternotomy for CABG. Patient rotated minimally right. Normal heart size with stable mediastinal contours. No pleural effusion or pneumothorax. Moderate lower lung and peripheral predominant interstitial thickening with somewhat more confluent subpleural interstitial opacity in the inferior  right upper lobe. No superimposed lobar consolidation. IMPRESSION: Chronic interstitial thickening which is consistent with interstitial lung disease when correlated with the prior CT of 07/23/2020. No acute superimposed process. Electronically Signed   By: Jeronimo Greaves M.D.   On: 05/20/2021 15:05    Procedures Procedures (including critical care time)  Medications Ordered in UC Medications - No data to display  Initial Impression / Assessment and Plan / UC Course  I have reviewed the triage vital signs and the nursing notes.  Pertinent labs & imaging results that were available during my care of the patient were reviewed by me and considered in my medical decision making (see chart for  details).    Chest x-ray negative for acute pneumonia.  However given patient's age and comorbidities strict ER precautions given if his symptoms do not readily improve with treatment prescribed.  Patient is actively wheezing and has a diagnosis of COPD therefore I am trialing him on a nebulizer treatments with DuoNebs every 6 hours as needed.  I am also prescribing him a low-dose of prednisone to take once daily for 5 days.  He has some upper respiratory nasal symptoms therefore will cover with doxycycline to cover for any potential bacterial sinusitis that may be worsening current symptoms.  Benzonatate Perles for cough.  Did advise that I would like for him to follow-up with his primary care doctor within 2 weeks or so following completion of treatment here.  Patient was stable at discharge and verbalized understanding and agreement with plan today. Final Clinical Impressions(s) / UC Diagnoses   Final diagnoses:  Bronchitis, chronic obstructive, with exacerbation (HCC)  Chronic dyspnea  Chronic stable angina [facility])     Discharge Instructions      Give nebulizer treatment every 6 hours as needed for wheezing and persistent coughing and or shortness of breath.  Complete entire course of prednisone and antibiotic.  I prescribed you benzonatate Perles this is for cough you can take this up to 3 times a day as needed for cough.  If at any point any of your symptoms worsen or you have worsening of shortness of breath, chest pain, or worsening of dizziness go immediately to the emergency department. Your x-ray was negative for pneumonia.     ED Prescriptions     Medication Sig Dispense Auth. Provider   ipratropium-albuterol (DUONEB) 0.5-2.5 (3) MG/3ML SOLN Take 3 mLs by nebulization every 6 (six) hours as needed. 75 mL Bing Neighbors, FNP   benzonatate (TESSALON) 100 MG capsule Take 1 capsule (100 mg total) by mouth every 8 (eight) hours. 21 capsule Bing Neighbors, FNP   doxycycline  (VIBRAMYCIN) 100 MG capsule Take 1 capsule (100 mg total) by mouth 2 (two) times daily for 7 days. 14 capsule Bing Neighbors, FNP   predniSONE (DELTASONE) 20 MG tablet Take 1 tablet (20 mg total) by mouth daily with breakfast for 5 days. 5 tablet Bing Neighbors, FNP      PDMP not reviewed this encounter.   Bing Neighbors, FNP 05/20/21 1539    Bing Neighbors, FNP 05/20/21 1540

## 2021-05-31 ENCOUNTER — Inpatient Hospital Stay: Payer: Medicare HMO | Attending: Oncology

## 2021-05-31 DIAGNOSIS — Z7901 Long term (current) use of anticoagulants: Secondary | ICD-10-CM | POA: Insufficient documentation

## 2021-05-31 DIAGNOSIS — I255 Ischemic cardiomyopathy: Secondary | ICD-10-CM | POA: Diagnosis not present

## 2021-05-31 DIAGNOSIS — D509 Iron deficiency anemia, unspecified: Secondary | ICD-10-CM | POA: Insufficient documentation

## 2021-05-31 DIAGNOSIS — K5900 Constipation, unspecified: Secondary | ICD-10-CM | POA: Insufficient documentation

## 2021-05-31 DIAGNOSIS — D6859 Other primary thrombophilia: Secondary | ICD-10-CM | POA: Diagnosis not present

## 2021-05-31 DIAGNOSIS — I480 Paroxysmal atrial fibrillation: Secondary | ICD-10-CM | POA: Insufficient documentation

## 2021-05-31 DIAGNOSIS — I2510 Atherosclerotic heart disease of native coronary artery without angina pectoris: Secondary | ICD-10-CM | POA: Diagnosis not present

## 2021-05-31 DIAGNOSIS — I2699 Other pulmonary embolism without acute cor pulmonale: Secondary | ICD-10-CM | POA: Diagnosis not present

## 2021-05-31 DIAGNOSIS — R0602 Shortness of breath: Secondary | ICD-10-CM

## 2021-05-31 DIAGNOSIS — Z86711 Personal history of pulmonary embolism: Secondary | ICD-10-CM

## 2021-05-31 DIAGNOSIS — Z862 Personal history of diseases of the blood and blood-forming organs and certain disorders involving the immune mechanism: Secondary | ICD-10-CM

## 2021-05-31 LAB — COMPREHENSIVE METABOLIC PANEL
ALT: 15 U/L (ref 0–44)
AST: 18 U/L (ref 15–41)
Albumin: 3.2 g/dL — ABNORMAL LOW (ref 3.5–5.0)
Alkaline Phosphatase: 42 U/L (ref 38–126)
Anion gap: 8 (ref 5–15)
BUN: 22 mg/dL (ref 8–23)
CO2: 26 mmol/L (ref 22–32)
Calcium: 8.6 mg/dL — ABNORMAL LOW (ref 8.9–10.3)
Chloride: 102 mmol/L (ref 98–111)
Creatinine, Ser: 1.2 mg/dL (ref 0.61–1.24)
GFR, Estimated: >60 mL/min (ref >60)
Glucose, Bld: 118 mg/dL — ABNORMAL HIGH (ref 70–99)
Potassium: 3.9 mmol/L (ref 3.5–5.1)
Sodium: 136 mmol/L (ref 135–145)
Total Bilirubin: 1 mg/dL (ref 0.3–1.2)
Total Protein: 6.6 g/dL (ref 6.5–8.1)

## 2021-05-31 LAB — CBC WITH DIFFERENTIAL/PLATELET
Abs Immature Granulocytes: 0.02 10*3/uL (ref 0.00–0.07)
Basophils Absolute: 0 10*3/uL (ref 0.0–0.1)
Basophils Relative: 0 %
Eosinophils Absolute: 0.1 10*3/uL (ref 0.0–0.5)
Eosinophils Relative: 1 %
HCT: 44.4 % (ref 39.0–52.0)
Hemoglobin: 14.7 g/dL (ref 13.0–17.0)
Immature Granulocytes: 0 %
Lymphocytes Relative: 31 %
Lymphs Abs: 2 10*3/uL (ref 0.7–4.0)
MCH: 32.8 pg (ref 26.0–34.0)
MCHC: 33.1 g/dL (ref 30.0–36.0)
MCV: 99.1 fL (ref 80.0–100.0)
Monocytes Absolute: 0.5 10*3/uL (ref 0.1–1.0)
Monocytes Relative: 8 %
Neutro Abs: 3.9 10*3/uL (ref 1.7–7.7)
Neutrophils Relative %: 60 %
Platelets: 194 10*3/uL (ref 150–400)
RBC: 4.48 MIL/uL (ref 4.22–5.81)
RDW: 14.2 % (ref 11.5–15.5)
WBC: 6.6 10*3/uL (ref 4.0–10.5)
nRBC: 0 % (ref 0.0–0.2)

## 2021-05-31 LAB — IRON AND TIBC
Iron: 71 ug/dL (ref 45–182)
Saturation Ratios: 28 % (ref 17.9–39.5)
TIBC: 256 ug/dL (ref 250–450)
UIBC: 185 ug/dL

## 2021-05-31 LAB — FERRITIN: Ferritin: 61 ng/mL (ref 24–336)

## 2021-06-02 ENCOUNTER — Inpatient Hospital Stay: Payer: Medicare HMO | Admitting: Oncology

## 2021-06-02 ENCOUNTER — Encounter: Payer: Self-pay | Admitting: Oncology

## 2021-06-02 VITALS — BP 143/86 | HR 50 | Temp 97.8°F | Resp 18 | Wt 175.9 lb

## 2021-06-02 DIAGNOSIS — Z862 Personal history of diseases of the blood and blood-forming organs and certain disorders involving the immune mechanism: Secondary | ICD-10-CM | POA: Diagnosis not present

## 2021-06-02 DIAGNOSIS — K5900 Constipation, unspecified: Secondary | ICD-10-CM

## 2021-06-02 DIAGNOSIS — D509 Iron deficiency anemia, unspecified: Secondary | ICD-10-CM | POA: Diagnosis not present

## 2021-06-02 DIAGNOSIS — R0602 Shortness of breath: Secondary | ICD-10-CM

## 2021-06-02 DIAGNOSIS — Z86711 Personal history of pulmonary embolism: Secondary | ICD-10-CM

## 2021-06-02 NOTE — Progress Notes (Signed)
[name]  Follow Up Visit: ? ?Patient Care Team: ?Baxter Hire, MD as PCP - General (Internal Medicine) ?Minna Merritts, MD as PCP - Cardiology (Cardiology) ?Madelyn Brunner, MD (Internal Medicine) ?Alisa Graff, FNP as Nurse Practitioner (Cardiology) ?Minna Merritts, MD as Consulting Physician (Cardiology) ? ?REASON FOR VISIT ?Follow up for iron deficiency anemia ? ?HISTORY OF PRESENTING ILLNESS: ?Scott [name] 81 y.o. male with PMH listed as below who is referred to me for evaluation and management of pulmonary embolism.  ?Scott [name] has a history paroxymal atrial fibrillation on Eliquis '5mg'$  BID until recently, history of CAD s/p CABG x [facility] in [facility] with chronic chest pain, s/p removal of sternal wire on 03/2016,  s/p sternal debridement and plating on 05/25/2016 in the setting of CT chest performed by pain management showing sternal nonunion of the manubrium with subsequent improvement in his pain. Perioperatively his anticoagulation with Eliquis was discontinue a few days prior surgery and resumed after surgery. Operation note mentioned that during the procedure, the left lung was densely adherent to the underside of the sternum and tear in the lung did occur and was sutured. He presents  to [facility] on 08/04/2016 for evaluation of SOB and was found to have a PE. Patient states that he was compliant with Eliquis.  ?CTA chest showed left upper lobe PE extending into the left main pulmonary artery. His Eliquis was changed to Xarelto. Lower extremity dopplers were negative for DVT. Hypercoagulable workup was sent and all tests were negative except a mild low protein S function.  No formal cancer workup.  Sed rate normal, heavy metals negative, TSH mildly reduced at 0.253. 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  ? ? ? Hypercoagulable work up: discussed with patient. He is tested negative for factor 5  leiden mutation, prothrombin mutation, anti phospholipin antibody panels. Normal protein C activity. Normal total protein S, mildly low protein S function, can be falsely low in the acute setting of thrombosis.Congential protein S deficiency is unlikely given that he has no prior history of VTE. Repeat protein S total and free were normal.  ? ?# He has had one dose of Feraheme and during the treatment he had a chest tightness and end of infusion. Has received IV iron Venofer and tolerates well.  ? ?#chronic intermittent chest pain and shortness of breath that has been previously extensively worked up.  He follows up with Dr. Nyoka Cowden for his heart care.  Extensive cardiology disease CAD status post prior RCA and LAD stenting as well as CABG x2 chronic chest pain status post sternal wire removal with sternal debridement and plating, ischemia cardiomyopathy, chronic dyspnea. ? ?# CT angios chest PE protocol done on 11/07/2016 showed no lung nodules or mass. ? ?INTERVAL HISTORY ?Patient presents for follow-up of chronic anticoagulation, iron deficiency present for follow up .  ?Denies hematochezia, hematuria, hematemesis, epistaxis, black tarry stool  ?He has noticed some blood in his nasal.  No continuous bleeding from neighbor. ?+ Constipation, straining. ?+ Chronic fatigue and shortness of breath. ? ?Review of Systems  ?Constitutional:  Positive for fatigue. Negative for appetite change, chills, fever and unexpected weight change.  ?HENT:   Negative for hearing loss and voice change.   ?Eyes:  Negative for eye problems and icterus.  ?Respiratory:  Positive for shortness of breath. Negative for chest tightness and cough.   ?Cardiovascular:  Negative for chest pain and leg swelling.  ?Gastrointestinal:  Negative for abdominal distention and abdominal pain.  ?Endocrine: Negative for hot flashes.  ?Genitourinary:  Negative for difficulty urinating, dysuria and frequency.   ?Musculoskeletal:  Negative for arthralgias.   ?Skin:  Negative for itching and rash.  ?Neurological:  Negative for light-headedness and numbness.  ?Hematological:  Negative for adenopathy. Does not bruise/bleed easily.  ?Psychiatric/Behavioral:  Negative for confusion.   ? ?MEDICAL HISTORY: ?Past Medical History:  ?Diagnosis Date  ? Arthritis   ? right hip, since a fall  ? CAD (coronary artery disease)   ? a. 04/2012 Cath: 3VD->Med Rx;  b. 04/2013 PCI RCA (2 DES); c. 03/2014 PCI: LAD 80 (3.0x23 Xience Alpine DES); d. 06/2014 CABG x 2 ([facility]) LIMA->LAD, VG->OM; e. 12/2014 Cath ([facility]): patent grafts->Med Rx; f. 08/2015 Cath: patent grafts; g. 11/2016 Cath: LM min irregs, LAD 20p, 4m LCX 100p/m, RCA 20ost, 10p/m/d, VG->OM1 nl, LIMA->dLAD nl; f. 07/2017 MV: No isch, EF 51%.  ? Cardiomyopathy, ischemic   ? a. 04/2012 Echo: EF 40-45%;  b. 08/2013 Echo: EF 45-50%; c. 01/2015 Echo: EF 40-45%; d. 07/2016 Echo: EF 60-65%; e. 09/2017 Echo: EF 55-60%, Gr1 DD.  ? Carotid arterial disease ([place]   ? a. 05/2013 Carotid U/S; bilat 40-50% ICA stenosis.  ? Chronic Chest Pain   ? USES NITRO  ? Chronic systolic CHF (congestive heart failure) ([place]   ? a. 01/2015 Echo: EF 40-45%; b. 07/2016 Echo: EF 60-65%; c. 09/2017 Echo: EF 55-60%, Gr1 DD, nl RV fxn.  ? Cough   ? Diverticulosis   ? Dizziness   ? Dyspnea   ? WITH EXERTION  ? Headache 1970's  ? migraine  ? History of blood transfusion 2016  ? post op  ? Hyperlipidemia   ? Hypertension   ? Iron deficiency anemia due to chronic blood loss 09/11/2016  ? Myocardial infarction ([facility]   ? unsure of when  ? Pulmonary emboli ([place] 07/2016  ? a. On Xarelto.  ? Reflux esophagitis   ? Sternal pain   ? a. 03/2016 s/p Sternal wire removal; b. 05/2016 s/p redo median sternotomy for sternal debridement and sternal plating.  ? Syncope 04/2018  ? CARDIOLOGIST WAS DR GRockey SituAND HE WAS AWARE-NOW PT SEES PARASCHOS  ? Tubular adenoma of colon   ? ? ?SURGICAL HISTORY: ?Past Surgical History:  ?Procedure Laterality Date  ? CARDIAC CATHETERIZATION  05/01/2012  ? CARDIAC  CATHETERIZATION  04/2013  ? [facility];x3 stent  ? CARDIAC CATHETERIZATION  01/11/15   ? [facility]  ? CARDIAC CATHETERIZATION N/A 09/16/2015  ? Procedure: LEFT HEART CATH AND CORS/GRAFTS ANGIOGRAPHY;  Surgeon: TMinna Merritts MD;  [facility] [place] [place];  Service: Cardiovascular;  Laterality: N/A;  ? CARPAL TUNNEL RELEASE    ? right hand  ? CATARACT EXTRACTION    ? LEFT  ? CATARACT EXTRACTION W/PHACO Left 09/16/2014  ? Procedure: CATARACT EXTRACTION PHACO AND INTRAOCULAR LENS PLACEMENT (IOC);  Surgeon: CLeandrew Koyanagi MD;  [place]  Service: Ophthalmology;  Laterality: Left;  ? COLONOSCOPY    ? COLONOSCOPY WITH PROPOFOL N/A 01/28/2016  ? Procedure: COLONOSCOPY WITH PROPOFOL;  Surgeon: MLollie Sails MD;  [facility];  Service: Endoscopy;  Laterality: N/A;  ? CORONARY ANGIOPLASTY WITH STENT PLACEMENT  04/13/2014  ? CORONARY ARTERY BYPASS GRAFT  06-26-14  ? x3 bypasses  ? CORONARY PRESSURE WIRE/FFR WITH 3D MAPPING N/A 12/10/2018  ? Procedure: INTRAVASULAR PRESSURE WIRE/FFR STUDY;  Surgeon: PIsaias Cowman MD;  Location: [place] [facility] LAB;  Service: Cardiovascular;  Laterality: N/A;  ? CYSTOSCOPY N/A 08/14/2018  ? Procedure: CYSTOSCOPY;  Surgeon: Abbie Sons, MD;  Location: [facility] ORS;  Service: Urology;  Laterality: N/A;  ? DE QUERVAIN'S RELEASE Left 08/22/2012  ? ESOPHAGOGASTRODUODENOSCOPY (EGD) WITH PROPOFOL N/A 04/26/2015  ? Procedure: ESOPHAGOGASTRODUODENOSCOPY (EGD) WITH PROPOFOL;  Surgeon: Hulen Luster, MD;  Location: [facility]COPY;  Service: Gastroenterology;  Laterality: N/A;  ? ESOPHAGOGASTRODUODENOSCOPY (EGD) WITH PROPOFOL N/A 05/29/2017  ? Procedure: ESOPHAGOGASTRODUODENOSCOPY (EGD) WITH PROPOFOL;  Surgeon: Lollie Sails, MD;  Location: [facility] ENDOSCOPY;  Service: Endoscopy;  Laterality: N/A;  ? INTRAVASCULAR ULTRASOUND/IVUS N/A 12/10/2018  ? Procedure: Intravascular Ultrasound/IVUS;  Surgeon: Isaias Cowman, MD;  Location: [place] [facility] LAB;  Service:  Cardiovascular;  Laterality: N/A;  ? LEFT HEART CATH AND CORONARY ANGIOGRAPHY N/A 12/04/2016  ? Procedure: LEFT HEART CATH AND CORS/GRAFTS  ANGIOGRAPHY;  Surgeon: Wellington Hampshire, MD;  Location: [place] INVASIVE C

## 2021-06-02 NOTE — Progress Notes (Signed)
Patient here for follow up. Pt reports that he had seen some blood when cleaning his navel.  ? ?

## 2021-06-04 ENCOUNTER — Other Ambulatory Visit: Payer: Self-pay

## 2021-06-04 ENCOUNTER — Emergency Department
Admission: EM | Admit: 2021-06-04 | Discharge: 2021-06-04 | Disposition: A | Discharge status: Home / Self Care | Payer: Medicare HMO | Attending: Emergency Medicine | Admitting: Emergency Medicine

## 2021-06-04 ENCOUNTER — Emergency Department: Payer: Medicare HMO

## 2021-06-04 ENCOUNTER — Encounter: Payer: Self-pay | Admitting: Emergency Medicine

## 2021-06-04 DIAGNOSIS — I509 Heart failure, unspecified: Secondary | ICD-10-CM | POA: Insufficient documentation

## 2021-06-04 DIAGNOSIS — R42 Dizziness and giddiness: Secondary | ICD-10-CM | POA: Diagnosis present

## 2021-06-04 DIAGNOSIS — R2689 Other abnormalities of gait and mobility: Secondary | ICD-10-CM

## 2021-06-04 DIAGNOSIS — I110 Hypertensive heart disease with heart failure: Secondary | ICD-10-CM | POA: Diagnosis not present

## 2021-06-04 DIAGNOSIS — Z20822 Contact with and (suspected) exposure to covid-19: Secondary | ICD-10-CM | POA: Insufficient documentation

## 2021-06-04 DIAGNOSIS — R2990 Unspecified symptoms and signs involving the nervous system: Secondary | ICD-10-CM | POA: Diagnosis not present

## 2021-06-04 DIAGNOSIS — R2681 Unsteadiness on feet: Secondary | ICD-10-CM | POA: Insufficient documentation

## 2021-06-04 DIAGNOSIS — Z951 Presence of aortocoronary bypass graft: Secondary | ICD-10-CM | POA: Diagnosis not present

## 2021-06-04 DIAGNOSIS — I2510 Atherosclerotic heart disease of native coronary artery without angina pectoris: Secondary | ICD-10-CM | POA: Insufficient documentation

## 2021-06-04 DIAGNOSIS — H81399 Other peripheral vertigo, unspecified ear: Secondary | ICD-10-CM | POA: Insufficient documentation

## 2021-06-04 DIAGNOSIS — H532 Diplopia: Secondary | ICD-10-CM | POA: Diagnosis present

## 2021-06-04 DIAGNOSIS — Z7901 Long term (current) use of anticoagulants: Secondary | ICD-10-CM | POA: Diagnosis not present

## 2021-06-04 LAB — RESP PANEL BY RT-PCR (FLU A&B, COVID) ARPGX2
Influenza A by PCR: NEGATIVE
Influenza B by PCR: NEGATIVE
SARS Coronavirus 2 by RT PCR: NEGATIVE

## 2021-06-04 LAB — DIFFERENTIAL
Abs Immature Granulocytes: 0.01 10*3/uL (ref 0.00–0.07)
Basophils Absolute: 0 10*3/uL (ref 0.0–0.1)
Basophils Relative: 0 %
Eosinophils Absolute: 0.1 10*3/uL (ref 0.0–0.5)
Eosinophils Relative: 1 %
Immature Granulocytes: 0 %
Lymphocytes Relative: 33 %
Lymphs Abs: 2.2 10*3/uL (ref 0.7–4.0)
Monocytes Absolute: 0.7 10*3/uL (ref 0.1–1.0)
Monocytes Relative: 11 %
Neutro Abs: 3.5 10*3/uL (ref 1.7–7.7)
Neutrophils Relative %: 55 %

## 2021-06-04 LAB — VITAMIN B12: Vitamin B-12: 214 pg/mL (ref 180–914)

## 2021-06-04 LAB — CBC
HCT: 45.4 % (ref 39.0–52.0)
Hemoglobin: 15 g/dL (ref 13.0–17.0)
MCH: 32.8 pg (ref 26.0–34.0)
MCHC: 33 g/dL (ref 30.0–36.0)
MCV: 99.3 fL (ref 80.0–100.0)
Platelets: 213 10*3/uL (ref 150–400)
RBC: 4.57 MIL/uL (ref 4.22–5.81)
RDW: 13.5 % (ref 11.5–15.5)
WBC: 6.5 10*3/uL (ref 4.0–10.5)
nRBC: 0 % (ref 0.0–0.2)

## 2021-06-04 LAB — PROTIME-INR
INR: 1.2 (ref 0.8–1.2)
Prothrombin Time: 15.3 seconds — ABNORMAL HIGH (ref 11.4–15.2)

## 2021-06-04 LAB — CBG MONITORING, ED: Glucose-Capillary: 155 mg/dL — ABNORMAL HIGH (ref 70–99)

## 2021-06-04 LAB — COMPREHENSIVE METABOLIC PANEL
ALT: 18 U/L (ref 0–44)
AST: 22 U/L (ref 15–41)
Albumin: 3.4 g/dL — ABNORMAL LOW (ref 3.5–5.0)
Alkaline Phosphatase: 47 U/L (ref 38–126)
Anion gap: 7 (ref 5–15)
BUN: 20 mg/dL (ref 8–23)
CO2: 26 mmol/L (ref 22–32)
Calcium: 9.1 mg/dL (ref 8.9–10.3)
Chloride: 104 mmol/L (ref 98–111)
Creatinine, Ser: 1.14 mg/dL (ref 0.61–1.24)
GFR, Estimated: >60 mL/min (ref >60)
Glucose, Bld: 64 mg/dL — ABNORMAL LOW (ref 70–99)
Potassium: 4.6 mmol/L (ref 3.5–5.1)
Sodium: 137 mmol/L (ref 135–145)
Total Bilirubin: 0.9 mg/dL (ref 0.3–1.2)
Total Protein: 6.8 g/dL (ref 6.5–8.1)

## 2021-06-04 LAB — ETHANOL: Alcohol, Ethyl (B): <10 mg/dL (ref <10)

## 2021-06-04 LAB — APTT: aPTT: 33 seconds (ref 24–36)

## 2021-06-04 MED ORDER — GADOBUTROL 1 MMOL/ML IV SOLN
7.5000 mL | Freq: Once | INTRAVENOUS | Status: AC | PRN
  Administered 2021-06-04: 7.5 mL via INTRAVENOUS

## 2021-06-04 MED ORDER — MECLIZINE HCL 25 MG PO TABS
25.0000 mg | ORAL_TABLET | Freq: Once | ORAL | Status: AC
Start: 2021-06-04 — End: 2021-06-04
  Administered 2021-06-04: 25 mg via ORAL
  Filled 2021-06-04: qty 1

## 2021-06-04 MED ORDER — MECLIZINE HCL 25 MG PO TABS: 25.0000 mg | ORAL_TABLET | Freq: Three times a day (TID) | ORAL | 0 refills | Status: DC | PRN

## 2021-06-04 NOTE — ED Provider Notes (Signed)
? ?[facility] ?Provider Note ? ? ? Event Date/Time  ? First MD Initiated Contact with Patient 06/04/21 1054   ?  (approximate) ? ? ?History  ? ?Chief Complaint ?Dizziness ? ? ?HPI ? ?Scott [name] is a 81 y.o. male with past medical history of hypertension, hyperlipidemia, CAD status post CABG, atrial fibrillation on Eliquis, PE, CHF, and protein S deficiency who presents to the ED complaining of dizziness.  Patient reports that he was going to blow leaves off of his driveway this morning when he had sudden onset of severe dizziness.  He denies feeling lightheaded like he is going to pass out or feeling like the room is spinning around him, but instead states that he feels very unsteady on his feet with difficulty walking.  He states it does not seem to affect 1 side of his body more than the other and he denies any associated numbness or weakness in his extremities.  He has not noticed any changes in his vision or speech, does state it is "hard for me to think."  He reports taking his last dose of Eliquis earlier this morning. ?  ? ? ?Physical Exam  ? ?Triage Vital Signs: ?ED Triage Vitals  ?Enc Vitals Group  ?   BP 06/04/21 1045 (!) 172/65  ?   Pulse Rate 06/04/21 1045 (!) 55  ?   Resp 06/04/21 1045 18  ?   Temp 06/04/21 1045 97.6 ?F (36.4 ?C)  ?   Temp src --   ?   SpO2 06/04/21 1045 97 %  ?   Weight 06/04/21 1044 176 lb (79.8 kg)  ?   Height 06/04/21 1044 '5\' 10"'$  (1.778 m)  ?   Head Circumference --   ?   Peak Flow --   ?   Pain Score 06/04/21 1044 0  ?   Pain Loc --   ?   Pain Edu? --   ?   Excl. in [address]? --   ? ? ?Most recent vital signs: ?Vitals:  ? 06/04/21 1045 06/04/21 1240  ?BP: (!) 172/65 (!) 154/72  ?Pulse: (!) 55 (!) 49  ?Resp: 18 20  ?Temp: 97.6 ?F (36.4 ?C) (!) 97.5 ?F (36.4 ?C)  ?SpO2: 97% 100%  ? ? ?Constitutional: Alert and oriented. ?Eyes: Conjunctivae are normal.  Pupils equal, round, and reactive to light bilaterally.  Extraocular movements intact. ?Head: Atraumatic. ?Nose:  No congestion/rhinnorhea. ?Mouth/Throat: Mucous membranes are moist.  ?Cardiovascular: Normal rate, regular rhythm. Grossly normal heart sounds.  2+ radial pulses bilaterally. ?Respiratory: Normal respiratory effort.  No retractions. Lungs CTAB. ?Gastrointestinal: Soft and nontender. No distention. ?Musculoskeletal: No lower extremity tenderness nor edema.  ?Neurologic:  Normal speech and language. No gross focal neurologic deficits are appreciated. ? ? ? ?ED Results / Procedures / Treatments  ? ?Labs ?(all labs ordered are listed, but only abnormal results are displayed) ?Labs Reviewed  ?PROTIME-INR - Abnormal; Notable for the following components:  ?    Result Value  ? Prothrombin Time 15.3 (*)   ? All other components within normal limits  ?COMPREHENSIVE METABOLIC PANEL - Abnormal; Notable for the following components:  ? Glucose, Bld 64 (*)   ? Albumin 3.4 (*)   ? All other components within normal limits  ?CBG MONITORING, ED - Abnormal; Notable for the following components:  ? Glucose-Capillary 155 (*)   ? All other components within normal limits  ?RESP PANEL BY RT-PCR (FLU A&B, COVID) ARPGX2  ?ETHANOL  ?APTT  ?  CBC  ?DIFFERENTIAL  ?URINE DRUG SCREEN, QUALITATIVE ([facility] ONLY)  ?URINALYSIS, ROUTINE W REFLEX MICROSCOPIC  ?VITAMIN B12  ? ? ?RADIOLOGY ?CT head reviewed and interpreted by me with no hemorrhage or midline shift. ? ?PROCEDURES: ? ?Critical Care performed: Yes, see critical care procedure note(s) ? ?.Critical Care ?Performed by: Blake Divine, MD ?Authorized by: Blake Divine, MD  ? ?Critical care provider statement:  ?  Critical care time (minutes):  30 ?  Critical care time was exclusive of:  Separately billable procedures and treating other patients and teaching time ?  Critical care was necessary to treat or prevent imminent or life-threatening deterioration of the following conditions:  CNS failure or compromise ?  Critical care was time spent personally by me on the following activities:   Development of treatment plan with patient or surrogate, discussions with consultants, evaluation of patient's response to treatment, examination of patient, ordering and review of laboratory studies, ordering and review of radiographic studies, ordering and performing treatments and interventions, pulse oximetry, re-evaluation of patient's condition and review of old charts ?  I assumed direction of critical care for this patient from another provider in my specialty: no   ? ? ?MEDICATIONS ORDERED IN ED: ?Medications  ?gadobutrol (GADAVIST) 1 MMOL/ML injection 7.5 mL (7.5 mLs Intravenous Contrast Given 06/04/21 1226)  ?meclizine (ANTIVERT) tablet 25 mg (25 mg Oral Given 06/04/21 1352)  ? ? ? ?IMPRESSION / MDM / ASSESSMENT AND PLAN / ED COURSE  ?I reviewed the triage vital signs and the nursing notes. ?             ?               ? ?81 y.o. male with past medical history of hypertension, hyperlipidemia, CAD status post CABG, atrial fibrillation on Eliquis, PE, CHF, and protein S deficiency who presents to the ED complaining of sudden onset severe dizziness around 9:00 this morning with feeling of unsteadiness while walking. ? ?Differential diagnosis includes, but is not limited to, stroke, TIA, large vessel occlusion, intracranial hemorrhage, peripheral vertigo, electrolyte abnormality, arrhythmia. ? ?Patient well-appearing and in no acute distress, vital signs are unremarkable and he does not appear to have any focal neurologic deficits on exam but does seem extremely dizzy at times.  Code stroke was called upon arrival due to concern for central cause of dizziness.  CT head is negative for acute process and care discussed with Dr. Curly Shores of neurology.  Given patient's contrast allergy, we will proceed with MRI and MRA to rule out basilar artery occlusion as well as other central cause of his symptoms.  If this is unremarkable, suspect peripheral vertigo but labs are also pending to rule out electrolyte  abnormality. ? ?MRI of brain and MRA are unremarkable with no acute stroke or arterial occlusion.  On reassessment, patient reports that his dizziness has resolved and he is feeling much better.  Labs are reassuring with CBC showing no anemia or leukocytosis, BMP without electrolyte abnormality or AKI, LFTs within normal limits.  Suspect symptoms are due to a peripheral vertigo and patient is appropriate for discharge home with outpatient follow-up.  Neurology had added on B12 levels but patient prefers to follow-up this result with his PCP.  He was counseled to return to the ED for new worsening symptoms, patient agrees with plan. ? ?  ? ? ?FINAL CLINICAL IMPRESSION(S) / ED DIAGNOSES  ? ?Final diagnoses:  ?Dizziness  ?Peripheral vertigo, unspecified laterality  ? ? ? ?Rx / DC  Orders  ? ?ED Discharge Orders   ? ?      Ordered  ?  meclizine (ANTIVERT) 25 MG tablet  3 times daily PRN       ? 06/04/21 1432  ? ?  ?  ? ?  ? ? ? ?Note:  This document was prepared using Dragon voice recognition software and may include unintentional dictation errors. ?  ?Blake Divine, MD ?06/04/21 1434 ? ?

## 2021-06-04 NOTE — Progress Notes (Addendum)
Chaplain responded to Code Stroke.  Checked in on pt and greeted family. Offered support and provided hospitality. Please contact as needed. ? ?Minus Liberty, Chaplain ?Pager:  [phone] ? ? ? 06/04/21 1100  ?Clinical Encounter Type  ?Visited With Family  ?Visit Type Initial  ?Referral From Nurse  ?Consult/Referral To Chaplain  ?Stress Factors  ?Patient Stress Factors Health changes  ? ? ?

## 2021-06-04 NOTE — ED Notes (Signed)
Pt A&O, pt given discharge instructions, IV's removed, pt steady on his feet. ?

## 2021-06-04 NOTE — ED Notes (Signed)
Pt transported to MRI by RN

## 2021-06-04 NOTE — ED Triage Notes (Signed)
Pt reports woke up at 0700 this am and was fine and then at 0830 he went to his shop and had a sudden onset of dizziness to where he can't even stand up. Pt leaning to the right side in the wheel chair.Pt reports difficult to explain the dizziness but does not appear like the room is spinning.  ?

## 2021-06-04 NOTE — Consult Note (Addendum)
Neurology Consultation ?Reason for Consult: Dizziness  ?Requesting Physician: Blake Divine ? ?CC: Dizziness, diplopia, speech change ? ?History is obtained from: Patient and chart review and wife at bedside ? ?HPI: Scott [name] is a 81 y.o. male with a past medical history significant for paroxysmal atrial fibrillation and PE on Eliquis, coronary artery disease s/p CABG (2016, 2017), hypertension, hyperlipidemia, prior episodes of dizziness, migraine headaches. ? ?Normal state of health recently, took Elqiuis at 7 AM and and walked to his shop at 9 AM.  9:10 AM while using a leaf blower he began to feel dizzy and off balance.  He was also having some double vision and came to the ED for further evaluation. ? ?On ROS he had bronchitis recently and his cough has been improving; he has been needing nebs for shortness of breath. He has also been somewhat constipated. Otherwise ROS is negative ? ?On review of records, he had similar presentations in 2018 and 2020. EEG and MRI brain both times were unremarkable and he was referred for outpatient ENT eval, vestibular therapy and stress management. When discussing this prior presentations with patients, he yells "no it has never been so bad I cannot walk!" In Dr. Doy Mince notes she documented "... to the point that he could not stand." ? ?LKW: 9 AM ?tPA given?: No, Eliquis at 9 AM ?IA performed?: No, no LVO  ?Premorbid modified rankin scale: 0-1 ?    0 - No symptoms. ?    1 - No significant disability. Able to carry out all usual activities, despite some symptoms. ? ?ROS: All other review of systems was negative except as noted in the HPI.  ? ?Past Medical History:  ?Diagnosis Date  ? Arthritis   ? right hip, since a fall  ? CAD (coronary artery disease)   ? a. 04/2012 Cath: 3VD->Med Rx;  b. 04/2013 PCI RCA (2 DES); c. 03/2014 PCI: LAD 80 (3.0x23 Xience Alpine DES); d. 06/2014 CABG x 2 ([facility]) LIMA->LAD, VG->OM; e. 12/2014 Cath ([facility]): patent grafts->Med Rx; f. 08/2015 Cath:  patent grafts; g. 11/2016 Cath: LM min irregs, LAD 20p, 82m LCX 100p/m, RCA 20ost, 10p/m/d, VG->OM1 nl, LIMA->dLAD nl; f. 07/2017 MV: No isch, EF 51%.  ? Cardiomyopathy, ischemic   ? a. 04/2012 Echo: EF 40-45%;  b. 08/2013 Echo: EF 45-50%; c. 01/2015 Echo: EF 40-45%; d. 07/2016 Echo: EF 60-65%; e. 09/2017 Echo: EF 55-60%, Gr1 DD.  ? Carotid arterial disease ([place]   ? a. 05/2013 Carotid U/S; bilat 40-50% ICA stenosis.  ? Chronic Chest Pain   ? USES NITRO  ? Chronic systolic CHF (congestive heart failure) ([place]   ? a. 01/2015 Echo: EF 40-45%; b. 07/2016 Echo: EF 60-65%; c. 09/2017 Echo: EF 55-60%, Gr1 DD, nl RV fxn.  ? Cough   ? Diverticulosis   ? Dizziness   ? Dyspnea   ? WITH EXERTION  ? Headache 1970's  ? migraine  ? History of blood transfusion 2016  ? post op  ? Hyperlipidemia   ? Hypertension   ? Iron deficiency anemia due to chronic blood loss 09/11/2016  ? Myocardial infarction ([facility]   ? unsure of when  ? Pulmonary emboli ([place] 07/2016  ? a. On Xarelto.  ? Reflux esophagitis   ? Sternal pain   ? a. 03/2016 s/p Sternal wire removal; b. 05/2016 s/p redo median sternotomy for sternal debridement and sternal plating.  ? Syncope 04/2018  ? CARDIOLOGIST WAS DR GRockey SituAND HE WAS AWARE-NOW PT SEES PARASCHOS  ?  Tubular adenoma of colon   ? ?Past Surgical History:  ?Procedure Laterality Date  ? CARDIAC CATHETERIZATION  05/01/2012  ? CARDIAC CATHETERIZATION  04/2013  ? [facility];x3 stent  ? CARDIAC CATHETERIZATION  01/11/15   ? [facility]  ? CARDIAC CATHETERIZATION N/A 09/16/2015  ? Procedure: LEFT HEART CATH AND CORS/GRAFTS ANGIOGRAPHY;  Surgeon: Minna Merritts, MD;  Location: [place] CV LAB;  Service: Cardiovascular;  Laterality: N/A;  ? CARPAL TUNNEL RELEASE    ? right hand  ? CATARACT EXTRACTION    ? LEFT  ? CATARACT EXTRACTION W/PHACO Left 09/16/2014  ? Procedure: CATARACT EXTRACTION PHACO AND INTRAOCULAR LENS PLACEMENT (IOC);  Surgeon: Leandrew Koyanagi, MD;  Location: [place];  Service: Ophthalmology;  Laterality: Left;   ? COLONOSCOPY    ? COLONOSCOPY WITH PROPOFOL N/A 01/28/2016  ? Procedure: COLONOSCOPY WITH PROPOFOL;  Surgeon: Lollie Sails, MD;  Location: [facility] ENDOSCOPY;  Service: Endoscopy;  Laterality: N/A;  ? CORONARY ANGIOPLASTY WITH STENT PLACEMENT  04/13/2014  ? CORONARY ARTERY BYPASS GRAFT  06-26-14  ? x3 bypasses  ? CORONARY PRESSURE WIRE/FFR WITH 3D MAPPING N/A 12/10/2018  ? Procedure: INTRAVASULAR PRESSURE WIRE/FFR STUDY;  Surgeon: Isaias Cowman, MD;  Location: [place] CV LAB;  Service: Cardiovascular;  Laterality: N/A;  ? CYSTOSCOPY N/A 08/14/2018  ? Procedure: CYSTOSCOPY;  Surgeon: Abbie Sons, MD;  Location: [facility] ORS;  Service: Urology;  Laterality: N/A;  ? DE QUERVAIN'S RELEASE Left 08/22/2012  ? ESOPHAGOGASTRODUODENOSCOPY (EGD) WITH PROPOFOL N/A 04/26/2015  ? Procedure: ESOPHAGOGASTRODUODENOSCOPY (EGD) WITH PROPOFOL;  Surgeon: Hulen Luster, MD;  Location: [facility] ENDOSCOPY;  Service: Gastroenterology;  Laterality: N/A;  ? ESOPHAGOGASTRODUODENOSCOPY (EGD) WITH PROPOFOL N/A 05/29/2017  ? Procedure: ESOPHAGOGASTRODUODENOSCOPY (EGD) WITH PROPOFOL;  Surgeon: Lollie Sails, MD;  Location: [facility] ENDOSCOPY;  Service: Endoscopy;  Laterality: N/A;  ? INTRAVASCULAR ULTRASOUND/IVUS N/A 12/10/2018  ? Procedure: Intravascular Ultrasound/IVUS;  Surgeon: Isaias Cowman, MD;  Location: [place] CV LAB;  Service: Cardiovascular;  Laterality: N/A;  ? LEFT HEART CATH AND CORONARY ANGIOGRAPHY N/A 12/04/2016  ? Procedure: LEFT HEART CATH AND CORS/GRAFTS  ANGIOGRAPHY;  Surgeon: Wellington Hampshire, MD;  Location: [place] CV LAB;  Service: Cardiovascular;  Laterality: N/A;  ? LEFT HEART CATH AND CORS/GRAFTS ANGIOGRAPHY N/A 04/09/2018  ? Procedure: LEFT HEART CATH AND CORS/GRAFTS ANGIOGRAPHY;  Surgeon: Isaias Cowman, MD;  Location: [place] CV LAB;  Service: Cardiovascular;  Laterality: N/A;  ? LEFT HEART CATH AND CORS/GRAFTS ANGIOGRAPHY N/A 12/10/2018  ? Procedure: LEFT HEART CATH AND CORS/GRAFTS  ANGIOGRAPHY;  Surgeon: Isaias Cowman, MD;  Location: [place] CV LAB;  Service: Cardiovascular;  Laterality: N/A;  ? LEFT HEART CATH AND CORS/GRAFTS ANGIOGRAPHY Left 05/25/2020  ? Procedure: LEFT HEART CATH AND CORS/GRAFTS ANGIOGRAPHY;  Surgeon: Isaias Cowman, MD;  Location: [place] CV LAB;  Service: Cardiovascular;  Laterality: Left;  ? RIB PLATING N/A 05/25/2016  ? Procedure: STERNAL PLATING;  Surgeon: Melrose Nakayama, MD;  Location: [place];  Service: Thoracic;  Laterality: N/A;  ? right shoulder    ? STERNAL WIRES REMOVAL N/A 03/30/2016  ? Procedure: STERNAL WIRES REMOVAL;  Surgeon: Melrose Nakayama, MD;  Location: [place];  Service: Thoracic;  Laterality: N/A;  ? TONSILLECTOMY    ? ?Current Outpatient Medications  ?Medication Instructions  ? atorvastatin (LIPITOR) 80 mg, Oral, Daily-1800  ? benzonatate (TESSALON) 100 mg, Oral, Every 8 hours  ? Eliquis 2.5 mg, Oral, 2 times daily  ? ezetimibe (ZETIA) 10 mg, Oral, Daily  ? ipratropium-albuterol (DUONEB)  0.5-2.5 (3) MG/3ML SOLN 3 mLs, Nebulization, Every 6 hours PRN  ? isosorbide mononitrate (IMDUR) 60 mg, Oral, 2 times daily  ? metoprolol succinate (TOPROL-XL) 25 mg, Oral, Every evening  ? nitroGLYCERIN (NITROSTAT) 0.4 mg, Sublingual, Every 5 min PRN  ? ranolazine (RANEXA) 1,000 mg, Oral, 2 times daily  ? ? ? ?Family History  ?Problem Relation Age of Onset  ? Heart attack Father 35  ?     MI  ? Cancer Maternal Uncle   ? ? ?Social History:  reports that he quit smoking about 14 years ago. His smoking use included cigarettes. He has a 35.00 pack-year smoking history. He has quit using smokeless tobacco. He reports that he does not drink alcohol and does not use drugs. ? ?Exam: ?Current vital signs: ?BP (!) 172/65 (BP Location: Left Arm)   Pulse (!) 55   Temp 97.6 ?F (36.4 ?C)   Resp 18   Ht '5\' 10"'$  (1.778 m)   Wt 79.8 kg   SpO2 97%   BMI 25.25 kg/m?  ?Vital signs in last 24 hours: ?Temp:  [97.6 ?F (36.4 ?C)] 97.6 ?F (36.4 ?C) (05/13  1045) ?Pulse Rate:  [55] 55 (05/13 1045) ?Resp:  [18] 18 (05/13 1045) ?BP: (172)/(65) 172/65 (05/13 1045) ?SpO2:  [97 %] 97 % (05/13 1045) ?Weight:  [79.8 kg] 79.8 kg (05/13 1044) ? ? ?Physical Exam  ?Constitution

## 2021-08-03 ENCOUNTER — Ambulatory Visit: Payer: Medicare HMO | Admitting: Gastroenterology

## 2021-08-03 ENCOUNTER — Other Ambulatory Visit: Payer: Self-pay

## 2021-08-03 ENCOUNTER — Encounter: Payer: Self-pay | Admitting: Gastroenterology

## 2021-08-03 VITALS — BP 126/72 | HR 61 | Temp 97.6°F | Ht 70.0 in | Wt 176.0 lb

## 2021-08-03 DIAGNOSIS — R1031 Right lower quadrant pain: Secondary | ICD-10-CM

## 2021-08-03 DIAGNOSIS — R194 Change in bowel habit: Secondary | ICD-10-CM

## 2021-08-03 DIAGNOSIS — E538 Deficiency of other specified B group vitamins: Secondary | ICD-10-CM | POA: Diagnosis not present

## 2021-08-03 DIAGNOSIS — K5909 Other constipation: Secondary | ICD-10-CM | POA: Diagnosis not present

## 2021-08-03 MED ORDER — NA SULFATE-K SULFATE-MG SULF 17.5-3.13-1.6 GM/177ML PO SOLN: 354.0000 mL | Freq: Once | ORAL | 0 refills | Status: AC

## 2021-08-03 NOTE — Patient Instructions (Signed)
Your CT scan is schedule for 08/11/2021 arrive to medical mall at 8:00am. Nothing to eat or drink after midnight.

## 2021-08-03 NOTE — Progress Notes (Unsigned)
Scott Darby, MD [address]  [place]  [place], [place] [address]  Main: [phone]  Fax: [phone]    Gastroenterology Consultation  Referring Provider:     Baxter Hire, MD Primary Care Physician:  Baxter Hire, MD Primary Gastroenterologist:  Dr. Cephas [name] Reason for Consultation: B12 deficiency        HPI:   EULAS SCHWEITZER is a 81 y.o. male referred by Dr. Edwina Barth, Chrystie Nose, MD  for consultation & management of B12 deficiency.  Patient is recently diagnosed with macrocytosis, found to have severe B12 deficiency.  Patient is known to have atrophic gastritis based on the upper endoscopy in 2019.  Patient reports right lower quadrant discomfort and constipation.  Has not tried MiraLAX. No known history of anemia.  Patient denies any rectal bleeding.  NSAIDs: None  Antiplts/Anticoagulants/Anti thrombotics: Eliquis  GI Procedures:  EGD 05/29/2017 - Z-line variable. Biopsied. - Gastritis. Biopsied. - Normal examined duodenum. DIAGNOSIS:  A.  GE JUNCTION; COLD BIOPSY:  - SQUAMOCOLUMNAR MUCOSA WITH REFLUX GASTROESOPHAGITIS.  - NEGATIVE FOR GOBLET CELLS, DYSPLASIA AND MALIGNANCY.   DIAGNOSIS:  A.  STOMACH, ANTRUM; COLD BIOPSY:  - REACTIVE GASTROPATHY WITH INTESTINAL METAPLASIA; INTESTINAL METAPLASIA  INVOLVES 90% OF 1 OF 2 FRAGMENTS.  - NEGATIVE FOR H. PYLORI BY IMMUNOHISTOCHEMISTRY (IHC).  - NEGATIVE FOR DYSPLASIA AND MALIGNANCY.   B.  STOMACH, BODY; COLD BIOPSY:  - ATROPHIC GASTRITIS WITH FOCAL INTESTINAL METAPLASIA.  - NEGATIVE FOR H. PYLORI BY IHC.  - NEGATIVE FOR DYSPLASIA AND MALIGNANCY.   C.  GASTROESOPHAGEAL JUNCTION; COLD BIOPSY:  - SQUAMOCOLUMNAR MUCOSA WITH CHRONIC INFLAMMATION INCLUDING LYMPHOID  FOLLICLES.  - NEGATIVE FOR GOBLET CELLS, DYSPLASIA, AND MALIGNANCY.   Colonoscopy 01/28/2016 - Preparation of the colon was fair. - Diverticulosis in the sigmoid colon and in the distal descending colon. - One 2 mm polyp in the proximal  sigmoid colon, removed with a cold biopsy forceps. Resected and retrieved. - One less than 1 mm polyp in the cecum, removed with a cold biopsy forceps. Resected and retrieved. - One less than 1 mm polyp in the proximal ascending colon, removed with a cold biopsy forceps. Resected and retrieved. - One 1 mm polyp in the distal descending colon, removed with a cold biopsy forceps. Resected and retrieved. - One less than 1 mm polyp in the rectum, removed with a cold biopsy forceps. Resected and retrieved. DIAGNOSIS:  A. COLON POLYP, PROXIMAL SIGMOID; COLD BIOPSY:  - TUBULAR ADENOMA.  - NEGATIVE FOR HIGH-GRADE DYSPLASIA AND MALIGNANCY.   B. COLON POLYP, CECUM; COLD BIOPSY:  - TUBULAR ADENOMA.  - NEGATIVE FOR HIGH-GRADE DYSPLASIA AND MALIGNANCY.   C. COLON POLYP, PROXIMAL ASCENDING; COLD BIOPSY:  - SMALL LYMPHOID AGGREGATE.  - MULTIPLE DEEPER LEVELS WERE EXAMINED.   D. COLON POLYP, DISTAL DESCENDING; COLD BIOPSY:  - SMALL LYMPHOID AGGREGATE.  - MULTIPLE DEEPER LEVELS WERE EXAMINED.   E. COLON POLYP, RECTUM; COLD BIOPSY:  - NO PATHOLOGIC CHANGE.  - MULTIPLE DEEPER LEVELS WERE EXAMINED.    Past Medical History:  Diagnosis Date   Arthritis    right hip, since a fall   CAD (coronary artery disease)    a. 04/2012 Cath: 3VD->Med Rx;  b. 04/2013 PCI RCA (2 DES); c. 03/2014 PCI: LAD 80 (3.0x23 Xience Alpine DES); d. 06/2014 CABG x 2 ([facility]) LIMA->LAD, VG->OM; e. 12/2014 Cath ([facility]): patent grafts->Med Rx; f. 08/2015 Cath: patent grafts; g. 11/2016 Cath: LM min irregs, LAD 20p, 67m LCX 100p/m,  RCA 20ost, 10p/m/d, VG->OM1 nl, LIMA->dLAD nl; f. 07/2017 MV: No isch, EF 51%.   Cardiomyopathy, ischemic    a. 04/2012 Echo: EF 40-45%;  b. 08/2013 Echo: EF 45-50%; c. 01/2015 Echo: EF 40-45%; d. 07/2016 Echo: EF 60-65%; e. 09/2017 Echo: EF 55-60%, Gr1 DD.   Carotid arterial disease ([place])    a. 05/2013 Carotid U/S; bilat 40-50% ICA stenosis.   Chronic Chest Pain    USES NITRO   Chronic systolic CHF (congestive  heart failure) ([place])    a. 01/2015 Echo: EF 40-45%; b. 07/2016 Echo: EF 60-65%; c. 09/2017 Echo: EF 55-60%, Gr1 DD, nl RV fxn.   Cough    Diverticulosis    Dizziness    Dyspnea    WITH EXERTION   Headache 1970's   migraine   History of blood transfusion 2016   post op   Hyperlipidemia    Hypertension    Iron deficiency anemia due to chronic blood loss 09/11/2016   Myocardial infarction [facility])    unsure of when   Pulmonary emboli ([place]) 07/2016   a. On Xarelto.   Reflux esophagitis    Sternal pain    a. 03/2016 s/p Sternal wire removal; b. 05/2016 s/p redo median sternotomy for sternal debridement and sternal plating.   Syncope 04/2018   CARDIOLOGIST WAS DR Rockey Situ AND HE WAS AWARE-NOW PT SEES PARASCHOS   Tubular adenoma of colon     Past Surgical History:  Procedure Laterality Date   CARDIAC CATHETERIZATION  05/01/2012   CARDIAC CATHETERIZATION  04/2013   [facility] stent   CARDIAC CATHETERIZATION  01/11/15    [facility]   CARDIAC CATHETERIZATION N/A 09/16/2015   Procedure: LEFT HEART CATH AND CORS/GRAFTS ANGIOGRAPHY;  Surgeon: Minna Merritts, MD;  Location: [place] CV LAB;  Service: Cardiovascular;  Laterality: N/A;   CARPAL TUNNEL RELEASE     right hand   CATARACT EXTRACTION     LEFT   CATARACT EXTRACTION W/PHACO Left 09/16/2014   Procedure: CATARACT EXTRACTION PHACO AND INTRAOCULAR LENS PLACEMENT (IOC);  Surgeon: Leandrew Koyanagi, MD;  Location: [place];  Service: Ophthalmology;  Laterality: Left;   COLONOSCOPY     COLONOSCOPY WITH PROPOFOL N/A 01/28/2016   Procedure: COLONOSCOPY WITH PROPOFOL;  Surgeon: Lollie Sails, MD;  Location: [facility] ENDOSCOPY;  Service: Endoscopy;  Laterality: N/A;   CORONARY ANGIOPLASTY WITH STENT PLACEMENT  04/13/2014   CORONARY ARTERY BYPASS GRAFT  06-26-14   x3 bypasses   CORONARY PRESSURE WIRE/FFR WITH 3D MAPPING N/A 12/10/2018   Procedure: Suzette Battiest PRESSURE WIRE/FFR STUDY;  Surgeon: Isaias Cowman, MD;  Location: [place]  CV LAB;  Service: Cardiovascular;  Laterality: N/A;   CYSTOSCOPY N/A 08/14/2018   Procedure: CYSTOSCOPY;  Surgeon: Abbie Sons, MD;  Location: [facility] ORS;  Service: Urology;  Laterality: N/A;   DE QUERVAIN'S RELEASE Left 08/22/2012   ESOPHAGOGASTRODUODENOSCOPY (EGD) WITH PROPOFOL N/A 04/26/2015   Procedure: ESOPHAGOGASTRODUODENOSCOPY (EGD) WITH PROPOFOL;  Surgeon: Hulen Luster, MD;  Location: [facility] ENDOSCOPY;  Service: Gastroenterology;  Laterality: N/A;   ESOPHAGOGASTRODUODENOSCOPY (EGD) WITH PROPOFOL N/A 05/29/2017   Procedure: ESOPHAGOGASTRODUODENOSCOPY (EGD) WITH PROPOFOL;  Surgeon: Lollie Sails, MD;  Location: [facility] ENDOSCOPY;  Service: Endoscopy;  Laterality: N/A;   INTRAVASCULAR ULTRASOUND/IVUS N/A 12/10/2018   Procedure: Intravascular Ultrasound/IVUS;  Surgeon: Isaias Cowman, MD;  Location: [place] CV LAB;  Service: Cardiovascular;  Laterality: N/A;   LEFT HEART CATH AND CORONARY ANGIOGRAPHY N/A 12/04/2016   Procedure: LEFT HEART CATH AND CORS/GRAFTS  ANGIOGRAPHY;  Surgeon: Fletcher Anon,  Mertie Clause, MD;  Location: [place] CV LAB;  Service: Cardiovascular;  Laterality: N/A;   LEFT HEART CATH AND CORS/GRAFTS ANGIOGRAPHY N/A 04/09/2018   Procedure: LEFT HEART CATH AND CORS/GRAFTS ANGIOGRAPHY;  Surgeon: Isaias Cowman, MD;  Location: [place] CV LAB;  Service: Cardiovascular;  Laterality: N/A;   LEFT HEART CATH AND CORS/GRAFTS ANGIOGRAPHY N/A 12/10/2018   Procedure: LEFT HEART CATH AND CORS/GRAFTS ANGIOGRAPHY;  Surgeon: Isaias Cowman, MD;  Location: [place] CV LAB;  Service: Cardiovascular;  Laterality: N/A;   LEFT HEART CATH AND CORS/GRAFTS ANGIOGRAPHY Left 05/25/2020   Procedure: LEFT HEART CATH AND CORS/GRAFTS ANGIOGRAPHY;  Surgeon: Isaias Cowman, MD;  Location: [place] CV LAB;  Service: Cardiovascular;  Laterality: Left;   RIB PLATING N/A 05/25/2016   Procedure: STERNAL PLATING;  Surgeon: Melrose Nakayama, MD;  Location: [place];  Service:  Thoracic;  Laterality: N/A;   right shoulder     STERNAL WIRES REMOVAL N/A 03/30/2016   Procedure: STERNAL WIRES REMOVAL;  Surgeon: Melrose Nakayama, MD;  Location: [place];  Service: Thoracic;  Laterality: N/A;   TONSILLECTOMY       Current Outpatient Medications:    atorvastatin (LIPITOR) 80 MG tablet, Take by mouth., Disp: , Rfl:    ELIQUIS 2.5 MG TABS tablet, Take 2.5 mg by mouth 2 (two) times daily., Disp: , Rfl:    ezetimibe (ZETIA) 10 MG tablet, Take 1 tablet (10 mg total) by mouth daily. (Patient taking differently: Take 10 mg by mouth at bedtime.), Disp: 90 tablet, Rfl: 3   isosorbide mononitrate (IMDUR) 60 MG 24 hr tablet, Take 60 mg by mouth 2 (two) times daily. , Disp: , Rfl:    metoprolol succinate (TOPROL-XL) 25 MG 24 hr tablet, Take 25 mg by mouth every evening., Disp: , Rfl:    ranolazine (RANEXA) 1000 MG SR tablet, Take 1 tablet (1,000 mg total) by mouth 2 (two) times daily., Disp: 180 tablet, Rfl: 3   pantoprazole (PROTONIX) 40 MG tablet, Take by mouth. (Patient not taking: Reported on 08/03/2021), Disp: , Rfl:   Current Facility-Administered Medications:    lidocaine (XYLOCAINE) 2 % jelly 1 application, 1 application , Urethral, Once, Stoioff, Scott C, MD   Family History  Problem Relation Age of Onset   Heart attack Father 21       MI   Cancer Maternal Uncle      Social History   Tobacco Use   Smoking status: Former    Packs/day: 1.00    Years: 35.00    Total pack years: 35.00    Types: Cigarettes    Quit date: 2009    Years since quitting: 14.5   Smokeless tobacco: Former  Scientific laboratory technician Use: Never used  Substance Use Topics   Alcohol use: No   Drug use: No    Allergies as of 08/03/2021 - Review Complete 08/03/2021  Allergen Reaction Noted   Contrast media [iodinated contrast media] Hives 06/08/2014   Metrizamide Hives 05/28/2017   Oxycodone hcl Itching 09/09/2014   Vicodin [hydrocodone-acetaminophen] Itching 03/23/2016    Review of  Systems:    All systems reviewed and negative except where noted in HPI.   Physical Exam:  BP 126/72 (BP Location: Left Arm, Patient Position: Sitting, Cuff Size: Normal)   Pulse 61   Temp 97.6 F (36.4 C) (Oral)   Ht '5\' 10"'$  (1.778 m)   Wt 176 lb (79.8 kg)   BMI 25.25 kg/m  No LMP for male patient.  General:   Alert,  Well-developed, well-nourished, pleasant and cooperative in NAD Head:  Normocephalic and atraumatic. Eyes:  Sclera clear, no icterus.   Conjunctiva pink. Ears:  Normal auditory acuity. Nose:  No deformity, discharge, or lesions. Mouth:  No deformity or lesions,oropharynx pink & moist. Neck:  Supple; no masses or thyromegaly. Lungs:  Respirations even and unlabored.  Clear throughout to auscultation.   No wheezes, crackles, or rhonchi. No acute distress. Heart:  Regular rate and rhythm; no murmurs, clicks, rubs, or gallops. Abdomen:  Normal bowel sounds. Soft, non-tender and non-distended without masses, hepatosplenomegaly or hernias noted.  No guarding or rebound tenderness.   Rectal: Not performed Msk:  Symmetrical without gross deformities. Good, equal movement & strength bilaterally. Pulses:  Normal pulses noted. Extremities:  No clubbing or edema.  No cyanosis. Neurologic:  Alert and oriented x3;  grossly normal neurologically. Skin:  Intact without significant lesions or rashes. No jaundice. Psych:  Alert and cooperative. Normal mood and affect.  Imaging Studies: No abdominal imaging  Assessment and Plan:   DAVIDSON PALMIERI is a 81 y.o. male with history of coronary artery disease, on Eliquis, atrophic gastritis is seen in consultation for right lower quadrant pain, abdominal bloating and 2 months history of severe constipation  Recommend CT abdomen pelvis without contrast, patient is allergic to iodine Recommend colonoscopy with 2-day prep Advised patient to start taking MiraLAX 1-2 doses daily with adequate intake of water  B12 deficiency without anemia,  atrophic gastritis, no evidence of H. pylori in the past Check parietal cell and intrinsic factor antibodies  Follow up based on the above work-up   Scott Darby, MD

## 2021-08-04 ENCOUNTER — Encounter: Payer: Self-pay | Admitting: Gastroenterology

## 2021-08-04 ENCOUNTER — Telehealth: Payer: Self-pay

## 2021-08-04 NOTE — Telephone Encounter (Signed)
Per Dr. Saralyn Pilar patient needs to stop the Eliquis 3 days prior to procedure and restart it as soon as possible after.

## 2021-08-04 NOTE — Telephone Encounter (Signed)
Patient called back and verbalized understanding.

## 2021-08-04 NOTE — Telephone Encounter (Signed)
Called and left a message for call back  

## 2021-08-07 LAB — INTRINSIC FACTOR ANTIBODIES: Intrinsic Factor Abs, Serum: 36.2 AU/mL — ABNORMAL HIGH (ref 0.0–1.1)

## 2021-08-07 LAB — ANTI-PARIETAL ANTIBODY: Parietal Cell Ab: 72.2 U — ABNORMAL HIGH (ref 0.0–20.0)

## 2021-08-11 ENCOUNTER — Ambulatory Visit
Admission: RE | Admit: 2021-08-11 | Discharge: 2021-08-11 | Disposition: A | Discharge status: Home / Self Care | Payer: Medicare HMO | Source: Ambulatory Visit | Attending: Gastroenterology | Admitting: Gastroenterology

## 2021-08-11 DIAGNOSIS — R1031 Right lower quadrant pain: Secondary | ICD-10-CM | POA: Insufficient documentation

## 2021-08-15 ENCOUNTER — Telehealth: Payer: Self-pay

## 2021-08-15 ENCOUNTER — Other Ambulatory Visit: Payer: Self-pay | Admitting: Family Medicine

## 2021-08-15 DIAGNOSIS — R140 Abdominal distension (gaseous): Secondary | ICD-10-CM

## 2021-08-15 DIAGNOSIS — K5900 Constipation, unspecified: Secondary | ICD-10-CM

## 2021-08-15 DIAGNOSIS — R1013 Epigastric pain: Secondary | ICD-10-CM

## 2021-08-15 NOTE — Telephone Encounter (Signed)
Patient verbalized understanding of results  

## 2021-08-15 NOTE — Telephone Encounter (Signed)
-----   Message from Lin Landsman, MD sent at 08/12/2021 10:17 AM EDT ----- CT scan of his abdomen did not reveal any pathology to explain right lower quadrant pain.  I will see him for the colonoscopy as scheduled  RV

## 2021-08-17 ENCOUNTER — Telehealth: Payer: Self-pay

## 2021-08-17 NOTE — Telephone Encounter (Signed)
Patient states they only got one box of the Suprep and they needed 2 boxes since it was a 2 day prep called the pharmacy and she states that insurance will only cover 1 box at at time. They re Runed the claim and they paid for it. Informed patient and they will go pick up the second box

## 2021-08-17 NOTE — Telephone Encounter (Signed)
Patients wife contacted office requesting a call back to help with her with her husbands colonoscopy instructions.  Please call patients wife back.   Thanks,  [place], [place]

## 2021-08-23 ENCOUNTER — Encounter: Payer: Self-pay | Admitting: Gastroenterology

## 2021-08-23 ENCOUNTER — Encounter
Admission: RE | Disposition: A | Discharge status: Home / Self Care | Payer: Self-pay | Source: Home / Self Care | Attending: Gastroenterology

## 2021-08-23 ENCOUNTER — Ambulatory Visit: Payer: Medicare HMO | Admitting: General Practice

## 2021-08-23 ENCOUNTER — Ambulatory Visit
Admission: RE | Admit: 2021-08-23 | Discharge: 2021-08-23 | Disposition: A | Discharge status: Home / Self Care | Payer: Medicare HMO | Attending: Gastroenterology | Admitting: Gastroenterology

## 2021-08-23 DIAGNOSIS — I252 Old myocardial infarction: Secondary | ICD-10-CM | POA: Diagnosis not present

## 2021-08-23 DIAGNOSIS — Z955 Presence of coronary angioplasty implant and graft: Secondary | ICD-10-CM | POA: Insufficient documentation

## 2021-08-23 DIAGNOSIS — I5022 Chronic systolic (congestive) heart failure: Secondary | ICD-10-CM | POA: Diagnosis not present

## 2021-08-23 DIAGNOSIS — I255 Ischemic cardiomyopathy: Secondary | ICD-10-CM | POA: Diagnosis not present

## 2021-08-23 DIAGNOSIS — Z1211 Encounter for screening for malignant neoplasm of colon: Secondary | ICD-10-CM | POA: Insufficient documentation

## 2021-08-23 DIAGNOSIS — I739 Peripheral vascular disease, unspecified: Secondary | ICD-10-CM | POA: Diagnosis not present

## 2021-08-23 DIAGNOSIS — I2510 Atherosclerotic heart disease of native coronary artery without angina pectoris: Secondary | ICD-10-CM | POA: Insufficient documentation

## 2021-08-23 DIAGNOSIS — E785 Hyperlipidemia, unspecified: Secondary | ICD-10-CM | POA: Insufficient documentation

## 2021-08-23 DIAGNOSIS — Z86010 Personal history of colonic polyps: Secondary | ICD-10-CM | POA: Insufficient documentation

## 2021-08-23 DIAGNOSIS — Z87891 Personal history of nicotine dependence: Secondary | ICD-10-CM | POA: Diagnosis not present

## 2021-08-23 DIAGNOSIS — Z951 Presence of aortocoronary bypass graft: Secondary | ICD-10-CM | POA: Insufficient documentation

## 2021-08-23 DIAGNOSIS — I110 Hypertensive heart disease with heart failure: Secondary | ICD-10-CM | POA: Diagnosis not present

## 2021-08-23 DIAGNOSIS — R194 Change in bowel habit: Secondary | ICD-10-CM | POA: Diagnosis not present

## 2021-08-23 HISTORY — PX: COLONOSCOPY WITH PROPOFOL: SHX5780

## 2021-08-23 SURGERY — COLONOSCOPY WITH PROPOFOL
Anesthesia: General

## 2021-08-23 MED ORDER — STERILE WATER FOR IRRIGATION IR SOLN
Status: DC | PRN
  Administered 2021-08-23: 60 mL

## 2021-08-23 MED ORDER — PROPOFOL 10 MG/ML IV BOLUS
INTRAVENOUS | Status: DC | PRN
  Administered 2021-08-23: 60 mg via INTRAVENOUS

## 2021-08-23 MED ORDER — EPHEDRINE SULFATE (PRESSORS) 50 MG/ML IJ SOLN
INTRAMUSCULAR | Status: DC | PRN
  Administered 2021-08-23: 10 mg via INTRAVENOUS

## 2021-08-23 MED ORDER — LIDOCAINE HCL (CARDIAC) PF 100 MG/5ML IV SOSY
PREFILLED_SYRINGE | INTRAVENOUS | Status: DC | PRN
  Administered 2021-08-23: 100 mg via INTRAVENOUS

## 2021-08-23 MED ORDER — SODIUM CHLORIDE 0.9 % IV SOLN: INTRAVENOUS | Status: DC

## 2021-08-23 MED ORDER — PROPOFOL 500 MG/50ML IV EMUL
INTRAVENOUS | Status: DC | PRN
  Administered 2021-08-23: 80 ug/kg/min via INTRAVENOUS

## 2021-08-23 NOTE — H&P (Signed)
Scott Darby, MD [address]  [place]  [place], [place] [address]  Main: [phone]  Fax: [phone] Pager: [phone]  Primary Care Physician:  Baxter Hire, MD Primary Gastroenterologist:  Dr. Cephas [name]  Pre-Procedure History & Physical: HPI:  Scott [name] is a 81 y.o. male is here for an colonoscopy.   Past Medical History:  Diagnosis Date   Arthritis    right hip, since a fall   CAD (coronary artery disease)    a. 04/2012 Cath: 3VD->Med Rx;  b. 04/2013 PCI RCA (2 DES); c. 03/2014 PCI: LAD 80 (3.0x23 Xience Alpine DES); d. 06/2014 CABG x 2 ([facility]) LIMA->LAD, VG->OM; e. 12/2014 Cath ([facility]): patent grafts->Med Rx; f. 08/2015 Cath: patent grafts; g. 11/2016 Cath: LM min irregs, LAD 20p, 45m LCX 100p/m, RCA 20ost, 10p/m/d, VG->OM1 nl, LIMA->dLAD nl; f. 07/2017 MV: No isch, EF 51%.   Cardiomyopathy, ischemic    a. 04/2012 Echo: EF 40-45%;  b. 08/2013 Echo: EF 45-50%; c. 01/2015 Echo: EF 40-45%; d. 07/2016 Echo: EF 60-65%; e. 09/2017 Echo: EF 55-60%, Gr1 DD.   Car[place]id arterial [place] ([place]    a. 05/2013 Carotid U/S; bilat 40-50% ICA stenosis.   Chronic Chest Pain    USES NITRO   Chronic systolic CHF (congestive heart failure) ([place]    a. 01/2015 Echo: EF 40-45%; b. 07/2016 Echo: EF 60-65%; c. 09/2017 Echo: EF 55-60%, Gr1 DD, nl RV fxn.   Cough    Diverticulosis    Dizziness    Dyspnea    WITH EXERTION   Headache 1970's   migraine   History of blood transfusion 2016   post op   Hyperlipidemia    Hypertension    Iron deficiency anemia due to chronic blood loss 09/11/2016   Myocardial infarction ([facility]    unsure of when   Pulmonary emboli ([place] 07/[place]   a. On Xarelto.   Reflux esophagitis    Sternal pain    a. 03/2016 s/p Sternal wire removal; b. 05/2016 s/p redo median sternotomy for sternal debridement and sternal plating.   Syncope 04/2018   CARDIOLOGIST WAS DR GRockey SituAND HE WAS AWARE-NOW PT SEES PARASCHOS   Tubular adenoma of colon     Past Surgical  History:  Procedure Laterality Date   CARDIAC CATHETERIZATION  05/01/2012   CARDIAC CATHETERIZATION  04/2013   [facility] stent   CARDIAC CATHETERIZATION  01/11/15    [facility]   CARDIAC CATHETERIZATION N/A 09/16/2015   Procedure: LEFT HEART CATH AND CORS/GRAFTS ANGIOGRAPHY;  Surgeon: TMinna Merritts MD;  [facility] [place] [facility];  Service: Cardiovascular;  Laterality: N/A;   CARPAL TUNNEL RELEASE     right hand   CATARACT EXTRACTION     LEFT   CATARACT EXTRACTION W/PHACO Left 09/16/2014   Procedure: CATARACT EXTRACTION PHACO AND INTRAOCULAR LENS PLACEMENT (IOC);  Surgeon: CLeandrew Koyanagi MD;  [place]  Service: Ophthalmology;  Laterality: Left;   COLONOSCOPY     COLONOSCOPY WITH PROPOFOL N/A 01/28/2016   Procedure: COLONOSCOPY WITH PROPOFOL;  Surgeon: MLollie Sails MD;  [facility];  Service: Endoscopy;  Laterality: N/A;   CORONARY ANGIOPLASTY WITH STENT PLACEMENT  04/13/2014   CORONARY ARTERY BYPASS GRAFT  06-26-14   x3 bypasses   CORONARY PRESSURE WIRE/FFR WITH 3D MAPPING N/A 12/10/2018   Procedure: ISuzette BattiestPRESSURE WIRE/FFR STUDY;  Surgeon: PIsaias Cowman MD;  [place] [facility];  Service: Cardiovascular;  Laterality: N/A;   CYSTOSCOPY N/A 08/14/2018  Procedure: CYSTOSCOPY;  Surgeon: Abbie Sons, MD;  Location: [facility];  Service: Urology;  Laterality: N/A;   DE QUERVAIN'S RELEASE Left 08/22/2012   ESOPHAGOGASTRODUODENOSCOPY (EGD) WITH PROPOFOL N/A 04/26/2015   Procedure: ESOPHAGOGASTRODUODENOSCOPY (EGD) WITH PROPOFOL;  Surgeon: Hulen Luster, MD;  Location: [facility] ENDOSCOPY;  Service: Gastroenterology;  Laterality: N/A;   ESOPHAGOGASTRODUODENOSCOPY (EGD) WITH PROPOFOL N/A 05/29/2017   Procedure: ESOPHAGOGASTRODUODENOSCOPY (EGD) WITH PROPOFOL;  Surgeon: Lollie Sails, MD;  Location: [facility] ENDOSCOPY;  Service: Endoscopy;  Laterality: N/A;   INTRAVASCULAR ULTRASOUND/IVUS N/A 12/10/2018   Procedure: Intravascular  Ultrasound/IVUS;  Surgeon: Isaias Cowman, MD;  Location: [place] CV LAB;  Service: Cardiovascular;  Laterality: N/A;   LEFT HEART CATH AND CORONARY ANGIOGRAPHY N/A 12/04/2016   Procedure: LEFT HEART CATH AND CORS/GRAFTS  ANGIOGRAPHY;  Surgeon: Wellington Hampshire, MD;  Location: [place] CV LAB;  Service: Cardiovascular;  Laterality: N/A;   LEFT HEART CATH AND CORS/GRAFTS ANGIOGRAPHY N/A 04/09/2018   Procedure: LEFT HEART CATH AND CORS/GRAFTS ANGIOGRAPHY;  Surgeon: Isaias Cowman, MD;  Location: [place] CV LAB;  Service: Cardiovascular;  Laterality: N/A;   LEFT HEART CATH AND CORS/GRAFTS ANGIOGRAPHY N/A 12/10/2018   Procedure: LEFT HEART CATH AND CORS/GRAFTS ANGIOGRAPHY;  Surgeon: Isaias Cowman, MD;  Location: [place] CV LAB;  Service: Cardiovascular;  Laterality: N/A;   LEFT HEART CATH AND CORS/GRAFTS ANGIOGRAPHY Left 05/25/2020   Procedure: LEFT HEART CATH AND CORS/GRAFTS ANGIOGRAPHY;  Surgeon: Isaias Cowman, MD;  Location: [place] CV LAB;  Service: Cardiovascular;  Laterality: Left;   RIB PLATING N/A 05/25/2016   Procedure: STERNAL PLATING;  Surgeon: Melrose Nakayama, MD;  Location: [place];  Service: Thoracic;  Laterality: N/A;   right shoulder     STERNAL WIRES REMOVAL N/A 03/30/2016   Procedure: STERNAL WIRES REMOVAL;  Surgeon: Melrose Nakayama, MD;  Location: [place];  Service: Thoracic;  Laterality: N/A;   TONSILLECTOMY      Prior to Admission medications   Medication Sig Start Date End Date Taking? Authorizing Provider  atorvastatin (LIPITOR) 80 MG tablet Take by mouth. 10/20/20   [provider]  ELIQUIS 2.5 MG TABS tablet Take 2.5 mg by mouth 2 (two) times daily. 03/19/19   [provider]  ezetimibe (ZETIA) 10 MG tablet Take 1 tablet (10 mg total) by mouth daily. Patient taking differently: Take 10 mg by mouth at bedtime. 12/07/17   Minna Merritts, MD  isosorbide mononitrate (IMDUR) 60 MG 24 hr tablet Take 60 mg  by mouth 2 (two) times daily.     [provider]  metoprolol succinate (TOPROL-XL) 25 MG 24 hr tablet Take 25 mg by mouth every evening. 03/18/19   [provider]  pantoprazole (PROTONIX) 40 MG tablet Take by mouth. Patient not taking: Reported on 08/03/2021 07/14/21 07/14/22  [provider]  ranolazine (RANEXA) 1000 MG SR tablet Take 1 tablet (1,000 mg total) by mouth 2 (two) times daily. 12/07/17   Minna Merritts, MD    Allergies as of 08/03/2021 - Review Complete 08/03/2021  Allergen Reaction Noted   Contrast media [iodinated contrast media] Hives 06/08/2014   Metrizamide Hives 05/28/2017   Oxycodone hcl Itching 09/09/2014   Vicodin [hydrocodone-acetaminophen] Itching 03/23/2016    Family History  Problem Relation Age of Onset   Heart attack Father 59       MI   Cancer Maternal Uncle     Social History   Socioeconomic History   Marital status: Married    Spouse name:  Not on file   Number of children: Not on file   Years of education: Not on file   Highest education level: Not on file  Occupational History   Not on file  Tobacco Use   Smoking status: Former    Packs/day: 1.00    Years: 35.00    Total pack years: 35.00    Types: Cigarettes    Quit date: 2009    Years since quitting: 14.5   Smokeless tobacco: Former  Scientific laboratory technician Use: Never used  Substance and Sexual Activity   Alcohol use: No   Drug use: No   Sexual activity: Yes  Other Topics Concern   Not on file  Social History Narrative   Not on file   Social Determinants of Health   Financial Resource Strain: Not on file  Food Insecurity: Not on file  Transportation Needs: Not on file  Physical Activity: Not on file  Stress: Not on file  Social Connections: Not on file  Intimate Partner Violence: Not on file    Review of Systems: See HPI, otherwise negative ROS  Physical Exam: BP 139/79   Pulse (!) 57   Temp (!) 96.9 F (36.1 C) (Temporal)   Resp 16   Ht  '5\' 10"'$  (1.778 m)   Wt 78.9 kg   SpO2 99%   BMI 24.97 kg/m  General:   Alert,  pleasant and cooperative in NAD Head:  Normocephalic and atraumatic. Neck:  Supple; no masses or thyromegaly. Lungs:  Clear throughout to auscultation.    Heart:  Regular rate and rhythm. Abdomen:  Soft, nontender and nondistended. Normal bowel sounds, without guarding, and without rebound.   Neurologic:  Alert and  oriented x4;  grossly normal neurologically.  Impression/Plan: Scott [name] is here for an colonoscopy to be performed for h/o colon adenoma  Risks, benefits, limitations, and alternatives regarding  colonoscopy have been reviewed with the patient.  Questions have been answered.  All parties agreeable.   Sherri Sear, MD  08/23/2021, 10:42 AM

## 2021-08-23 NOTE — Anesthesia Preprocedure Evaluation (Signed)
Anesthesia Evaluation  Patient identified by MRN, date of birth, ID band Patient awake    Reviewed: Allergy & Precautions, H&P , NPO status , Patient's Chart, lab work & pertinent test results, reviewed documented beta blocker date and time   History of Anesthesia Complications Negative for: history of anesthetic complications  Airway Mallampati: III  TM Distance: >3 FB Neck ROM: full    Dental no notable dental hx. (+) Teeth Intact   Pulmonary shortness of breath and with exertion, neg sleep apnea, neg COPD, neg recent URI, former smoker,           Cardiovascular Exercise Tolerance: Good hypertension, + angina with exertion + CAD, + Past MI, + Cardiac Stents (4 stents), + CABG, + Peripheral Vascular Disease and +CHF  (-) dysrhythmias (-) Valvular Problems/Murmurs     Neuro/Psych PSYCHIATRIC DISORDERS negative neurological ROS     GI/Hepatic negative GI ROS, Neg liver ROS,   Endo/Other  negative endocrine ROS  Renal/GU negative Renal ROS  negative genitourinary   Musculoskeletal   Abdominal   Peds  Hematology negative hematology ROS (+)   Anesthesia Other Findings Past Medical History:   Cardiomyopathy, ischemic                                       Comment:a. 04/2012 Echo: EF 40-45%;  b. 08/2013 Echo: EF               45-50%, mild glob HK, mod lat/post HK, diast               dysfxn, mildly dil LA, mild MR, nl RVSP; c.               01/2015 Echo: EF 40-45%, Gr 1 DD, mildly dil LA.   Hyperlipidemia                                               Carotid arterial disease ([place])                                 Comment:a. 05/2013 Carotid U/S; bilat 40-50% ICA               stenosis.   CAD (coronary artery disease)                                  Comment:a. 04/2012 Cath: 3VD->Med Rx;  b. 04/2013               Cath/PCI: RCA 70p, 20m 95d. Mid & distal RCA               treated with 2 DES; c. 03/2014 PCI: LAD 80                (3.0x23 Xience Alpine DES), 30 ISR in RCA; d.               06/2014 CABG x 2 ([facility]) LIMA->LAD, VG->OM; e.               11/2014 Neg MV; f. 12/2014 Cath ([facility]): patent  grafts->Med Rx.   Chronic Chest Pain                                           Chronic systolic CHF (congestive heart failure*                Comment:a. 01/2015 Echo: EF 40-45%.   Reproductive/Obstetrics negative OB ROS                            Anesthesia Physical  Anesthesia Plan  ASA: III  Anesthesia Plan: General   Post-op Pain Management: Minimal or no pain anticipated   Induction: Intravenous  PONV Risk Score and Plan: 2 and Propofol infusion and TIVA  Airway Management Planned: Nasal Cannula  Additional Equipment: None  Intra-op Plan:   Post-operative Plan:   Informed Consent: I have reviewed the patients History and Physical, chart, labs and discussed the procedure including the risks, benefits and alternatives for the proposed anesthesia with the patient or authorized representative who has indicated his/her understanding and acceptance.     Dental Advisory Given  Plan Discussed with: Anesthesiologist, CRNA and Surgeon  Anesthesia Plan Comments: (Discussed risks of anesthesia with patient, including possibility of difficulty with spontaneous ventilation under anesthesia necessitating airway intervention, PONV, and rare risks such as cardiac or respiratory or neurological events, and allergic reactions. Discussed the role of CRNA in patient's perioperative care. Patient understands.)        Anesthesia Quick Evaluation

## 2021-08-23 NOTE — Op Note (Signed)
[facility] Gastroenterology Patient Name: Scott [name] Procedure Date: 08/23/2021 10:50 AM MRN: [id number] Account #: [id number] Date of Birth: 07-22-1940 Admit Type: Outpatient Age: 81 Room: [facility] ENDO ROOM 3 Gender: Male Note Status: Finalized Instrument Name: Jasper Riling [id number] Procedure:             Colonoscopy Indications:           Surveillance: Personal history of adenomatous polyps                         on last colonoscopy 5 years ago, Last colonoscopy:                         January 2018 Providers:             Lin Landsman MD, MD Referring MD:          Andres Labrum, MD (Referring MD) Medicines:             General Anesthesia Complications:         No immediate complications. Estimated blood loss: None. Procedure:             Pre-Anesthesia Assessment:                        - Prior to the procedure, a History and Physical was                         performed, and patient medications and allergies were                         reviewed. The patient is competent. The risks and                         benefits of the procedure and the sedation options and                         risks were discussed with the patient. All questions                         were answered and informed consent was obtained.                         Patient identification and proposed procedure were                         verified by the physician, the nurse, the                         anesthesiologist, the anesthetist and the technician                         in the pre-procedure area in the procedure room in the                         endoscopy suite. Mental Status Examination: alert and                         oriented. Airway Examination: normal oropharyngeal  airway and neck mobility. Respiratory Examination:                         clear to auscultation. CV Examination: normal.                         Prophylactic Antibiotics: The patient  does not require                         prophylactic antibiotics. Prior Anticoagulants: The                         patient has taken Eliquis (apixaban), last dose was 3                         days prior to procedure. ASA Grade Assessment: III - A                         patient with severe systemic disease. After reviewing                         the risks and benefits, the patient was deemed in                         satisfactory condition to undergo the procedure. The                         anesthesia plan was to use general anesthesia.                         Immediately prior to administration of medications,                         the patient was re-assessed for adequacy to receive                         sedatives. The heart rate, respiratory rate, oxygen                         saturations, blood pressure, adequacy of pulmonary                         ventilation, and response to care were monitored                         throughout the procedure. The physical status of the                         patient was re-assessed after the procedure.                        After obtaining informed consent, the colonoscope was                         passed under direct vision. Throughout the procedure,                         the patient's blood pressure, pulse, and oxygen  saturations were monitored continuously. The                         Colonoscope was introduced through the anus and                         advanced to the the terminal ileum, with                         identification of the appendiceal orifice and IC                         valve. The colonoscopy was performed without                         difficulty. The patient tolerated the procedure well.                         The quality of the bowel preparation was evaluated                         using the BBPS [facility] Bowel Preparation Scale) with                         scores of: Right Colon  = 3, Transverse Colon = 3 and                         Left Colon = 3 (entire mucosa seen well with no                         residual staining, small fragments of stool or opaque                         liquid). The total BBPS score equals 9. Findings:      The perianal and digital rectal examinations were normal. Pertinent       negatives include normal sphincter tone and no palpable rectal lesions.      The terminal ileum appeared normal.      A diminutive polyp was found in the transverse colon. The polyp was       sessile. The polyp was removed with a cold biopsy forceps. Resection and       retrieval were complete.      A 4 mm polyp was found in the descending colon. The polyp was sessile.       The polyp was removed with a cold snare. Resection and retrieval were       complete.      The retroflexed view of the distal rectum and anal verge was normal and       showed no anal or rectal abnormalities. Impression:            - The examined portion of the ileum was normal.                        - One diminutive polyp in the transverse colon,                         removed with a cold biopsy forceps. Resected and  retrieved.                        - One 4 mm polyp in the descending colon, removed with                         a cold snare. Resected and retrieved.                        - The distal rectum and anal verge are normal on                         retroflexion view. Recommendation:        - Discharge patient to home (with escort).                        - Resume previous diet today.                        - Continue present medications.                        - Await pathology results.                        - Repeat colonoscopy in 5 to 7 years for surveillance                         based on pathology results. Procedure Code(s):     --- Professional ---                        [phone], Colonoscopy, flexible; with removal of                          tumor(s), polyp(s), or other lesion(s) by snare                         technique                        [id number], 57, Colonoscopy, flexible; with biopsy, single                         or multiple Diagnosis Code(s):     --- Professional ---                        Z86.010, Personal history of colonic polyps                        K63.5, Polyp of colon CPT copyright 2019 American Medical Association. All rights reserved. The codes documented in this report are preliminary and upon coder review may  be revised to meet current compliance requirements. Dr. Ulyess Mort Lin Landsman MD, MD 08/23/2021 11:16:02 AM This report has been signed electronically. Number of Addenda: 0 Note Initiated On: 08/23/2021 10:50 AM Scope Withdrawal Time: 0 hours 10 minutes 32 seconds  Total Procedure Duration: 0 hours 13 minutes 17 seconds  Estimated Blood Loss:  Estimated blood loss: none.      [facility]

## 2021-08-23 NOTE — Anesthesia Postprocedure Evaluation (Signed)
Anesthesia Post Note  Patient: Scott [name]  Procedure(s) Performed: COLONOSCOPY WITH PROPOFOL  Patient location during evaluation: Endoscopy Anesthesia Type: General Level of consciousness: awake and alert Pain management: pain level controlled Vital Signs Assessment: post-procedure vital signs reviewed and stable Respiratory status: spontaneous breathing, nonlabored ventilation, respiratory function stable and patient connected to nasal cannula oxygen Cardiovascular status: blood pressure returned to baseline and stable Postop Assessment: no apparent nausea or vomiting Anesthetic complications: no   No notable events documented.   Last Vitals:  Vitals:   08/23/21 1126 08/23/21 1136  BP: 122/73 134/70  Pulse: 60 (!) 52  Resp: 17 12  Temp:    SpO2: 99% 99%    Last Pain:  Vitals:   08/23/21 1136  TempSrc:   PainSc: 0-No pain                 Dimas Millin

## 2021-08-23 NOTE — Transfer of Care (Signed)
Immediate Anesthesia Transfer of Care Note  Patient: Scott [name]  Procedure(s) Performed: COLONOSCOPY WITH PROPOFOL  Patient Location: PACU and Endoscopy Unit  Anesthesia Type:General  Level of Consciousness: awake  Airway & Oxygen Therapy: Patient Spontanous Breathing  Post-op Assessment: Report given to RN  Post vital signs: stable  Last Vitals:  Vitals Value Taken Time  BP    Temp    Pulse    Resp    SpO2      Last Pain:  Vitals:   08/23/21 0934  TempSrc: Temporal  PainSc: 0-No pain         Complications: No notable events documented.

## 2021-08-24 ENCOUNTER — Encounter: Payer: Self-pay | Admitting: Gastroenterology

## 2021-08-24 LAB — SURGICAL PATHOLOGY

## 2021-08-25 ENCOUNTER — Ambulatory Visit: Payer: Medicare HMO

## 2021-09-20 ENCOUNTER — Other Ambulatory Visit: Payer: Self-pay

## 2021-09-20 ENCOUNTER — Telehealth: Payer: Self-pay

## 2021-09-20 ENCOUNTER — Telehealth: Payer: Self-pay | Admitting: Acute Care

## 2021-09-20 DIAGNOSIS — Z1210 Encounter for screening for malignant neoplasm of intestinal tract, unspecified: Secondary | ICD-10-CM

## 2021-09-20 DIAGNOSIS — Z87891 Personal history of nicotine dependence: Secondary | ICD-10-CM

## 2021-09-20 NOTE — Telephone Encounter (Signed)
Attempted to reach pt to schedule annual LDCT-LVMM.  Prior to call pt d/t age of 81-order was placed in first and accepted.  According to last yr LDCT he has 29 pk yr, but noted as former smoker--number of years unspecified.

## 2021-09-20 NOTE — Telephone Encounter (Signed)
Pt left message on program voice mail.  Attempted to call back to schedule annual LDCT, call went to voicemail again.

## 2021-10-03 ENCOUNTER — Telehealth: Payer: Self-pay

## 2021-10-03 ENCOUNTER — Inpatient Hospital Stay: Admission: RE | Admit: 2021-10-03 | Payer: Medicare HMO | Source: Ambulatory Visit

## 2021-10-03 NOTE — Telephone Encounter (Signed)
Insurance denied continuation of LDCT.  Patient is no longer eligible for LCS due to age 81 years, per insurance plan.  Future alternative would be diagnostic CT chest wo contrast, per PCP recommendation.

## 2021-10-03 NOTE — Telephone Encounter (Signed)
Patient was notified by Vella Kohler [facility] that [facility] [facility] will not approve his next LDCT due to over age 81 years.  Will copy PCP on this message.  Future CT chest, if recommended, would have to be ordered under PCP as diagnostic CT.  Patient is removed from [facility]CS LDCT services.

## 2021-10-04 ENCOUNTER — Other Ambulatory Visit: Payer: Self-pay | Admitting: Pulmonary Disease

## 2021-10-04 DIAGNOSIS — R0609 Other forms of dyspnea: Secondary | ICD-10-CM

## 2021-10-05 ENCOUNTER — Ambulatory Visit
Admission: RE | Admit: 2021-10-05 | Discharge: 2021-10-05 | Disposition: A | Discharge status: Home / Self Care | Payer: Medicare HMO | Source: Ambulatory Visit | Attending: Pulmonary Disease | Admitting: Pulmonary Disease

## 2021-10-05 DIAGNOSIS — R0609 Other forms of dyspnea: Secondary | ICD-10-CM | POA: Diagnosis present

## 2022-06-07 ENCOUNTER — Other Ambulatory Visit: Payer: Self-pay

## 2022-06-07 ENCOUNTER — Emergency Department
Admission: EM | Admit: 2022-06-07 | Discharge: 2022-06-07 | Disposition: A | Discharge status: Home / Self Care | Payer: Medicare HMO | Attending: Emergency Medicine | Admitting: Emergency Medicine

## 2022-06-07 ENCOUNTER — Encounter: Payer: Self-pay | Admitting: Emergency Medicine

## 2022-06-07 ENCOUNTER — Emergency Department: Payer: Medicare HMO

## 2022-06-07 DIAGNOSIS — Z951 Presence of aortocoronary bypass graft: Secondary | ICD-10-CM | POA: Diagnosis not present

## 2022-06-07 DIAGNOSIS — Y990 Civilian activity done for income or pay: Secondary | ICD-10-CM | POA: Diagnosis not present

## 2022-06-07 DIAGNOSIS — S61211A Laceration without foreign body of left index finger without damage to nail, initial encounter: Secondary | ICD-10-CM

## 2022-06-07 DIAGNOSIS — W312XXA Contact with powered woodworking and forming machines, initial encounter: Secondary | ICD-10-CM | POA: Diagnosis not present

## 2022-06-07 DIAGNOSIS — Z7901 Long term (current) use of anticoagulants: Secondary | ICD-10-CM | POA: Diagnosis not present

## 2022-06-07 DIAGNOSIS — S62522B Displaced fracture of distal phalanx of left thumb, initial encounter for open fracture: Secondary | ICD-10-CM | POA: Insufficient documentation

## 2022-06-07 DIAGNOSIS — Z23 Encounter for immunization: Secondary | ICD-10-CM | POA: Diagnosis not present

## 2022-06-07 DIAGNOSIS — S61321A Laceration with foreign body of left index finger with damage to nail, initial encounter: Secondary | ICD-10-CM | POA: Insufficient documentation

## 2022-06-07 DIAGNOSIS — S61012A Laceration without foreign body of left thumb without damage to nail, initial encounter: Secondary | ICD-10-CM | POA: Diagnosis not present

## 2022-06-07 DIAGNOSIS — S6992XA Unspecified injury of left wrist, hand and finger(s), initial encounter: Secondary | ICD-10-CM | POA: Diagnosis present

## 2022-06-07 MED ORDER — CEPHALEXIN 500 MG PO CAPS: 500.0000 mg | ORAL_CAPSULE | Freq: Four times a day (QID) | ORAL | 0 refills | Status: AC

## 2022-06-07 MED ORDER — LIDOCAINE HCL (PF) 1 % IJ SOLN
5.0000 mL | Freq: Once | INTRAMUSCULAR | Status: DC
  Filled 2022-06-07: qty 5

## 2022-06-07 MED ORDER — TETANUS-DIPHTH-ACELL PERTUSSIS 5-2.5-18.5 LF-MCG/0.5 IM SUSY
0.5000 mL | PREFILLED_SYRINGE | Freq: Once | INTRAMUSCULAR | Status: AC
  Administered 2022-06-07: 0.5 mL via INTRAMUSCULAR
  Filled 2022-06-07: qty 0.5

## 2022-06-07 MED ORDER — TRAMADOL HCL 50 MG PO TABS: 50.0000 mg | ORAL_TABLET | Freq: Three times a day (TID) | ORAL | 0 refills | Status: DC | PRN

## 2022-06-07 MED ORDER — CEFAZOLIN SODIUM-DEXTROSE 1-4 GM/50ML-% IV SOLN
1.0000 g | Freq: Once | INTRAVENOUS | Status: AC
  Administered 2022-06-07: 1 g via INTRAVENOUS
  Filled 2022-06-07: qty 50

## 2022-06-07 NOTE — ED Notes (Signed)
Laceration present to left thumb and left index finger from saw. Bleeding controlled at this time

## 2022-06-07 NOTE — ED Provider Notes (Addendum)
[facility] Provider Note    Event Date/Time   First MD Initiated Contact with Patient 06/07/22 1001     (approximate)   History   Laceration   HPI  Scott [name] is a 82 y.o. male who presents to the emergency department after cutting his hand on a table saw while working on a rocking chair this morning.  He states that without thinking he reached up and touched the saw with his left hand and cut his left thumb and left index finger.  He has a cardiac history including a CABG, multiple stents and is on Eliquis.  He was able to control the bleeding at home with some paper towel.  He reports some mild pain to the thumb but is otherwise in no distress.  His wife is present with him at bedside.      Physical Exam   Triage Vital Signs: ED Triage Vitals  Enc Vitals Group     BP 06/07/22 0952 (!) 144/77     Pulse Rate 06/07/22 0952 67     Resp 06/07/22 0952 20     Temp 06/07/22 0952 98.1 F (36.7 C)     Temp src --      SpO2 06/07/22 0952 98 %     Weight 06/07/22 0951 170 lb (77.1 kg)     Height 06/07/22 0951 5\' 10"  (1.778 m)     Head Circumference --      Peak Flow --      Pain Score 06/07/22 0951 0     Pain Loc --      Pain Edu? --      Excl. in GC? --     Most recent vital signs: Vitals:   06/07/22 0952  BP: (!) 144/77  Pulse: 67  Resp: 20  Temp: 98.1 F (36.7 C)  SpO2: 98%    General: Awake, no distress.  CV:  Good peripheral perfusion.  Resp:  Normal effort.  Abd:  No distention.  Left Hand: Deep 5 cm laceration of the left thumb on the palmar side.  3 cm laceration of the left index finger on the palmar side.  Full range of motion in flexion and extension abduction and adduction of the left index finger and left thumb.  Patient does have some sensation deficits on the tip of the left thumb.  Left thumb bleeding copiously.    ED Results / Procedures / Treatments   Labs (all labs ordered are listed, but only abnormal results are  displayed) Labs Reviewed - No data to display   RADIOLOGY Left hand x-rays obtained in the ED today.  I individually interpreted and reviewed the images which show an open fracture of the first distal phalanx.  No evidence of retained foreign body.  This is consistent with the radiology report.   PROCEDURES:  Critical Care performed: No  ..Laceration Repair  Date/Time: 06/07/2022 1:43 PM  Performed by: Cruz Condon, PA-C Authorized by: Cruz Condon, PA-C   Consent:    Consent obtained:  Verbal   Consent given by:  Patient   Risks discussed:  Infection, need for additional repair, poor cosmetic result and pain   Alternatives discussed:  No treatment Universal protocol:    Procedure explained and questions answered to patient or proxy's satisfaction: yes     Imaging studies available: yes     Patient identity confirmed:  Verbally with patient Anesthesia:    Anesthesia method:  Nerve block and local  infiltration   Local anesthetic:  Lidocaine 1% w/o epi   Block location:  Base of the left thumb   Block needle gauge:  25 G   Block anesthetic:  Lidocaine 1% w/o epi   Block injection procedure:  Negative aspiration for blood, anatomic landmarks identified, introduced needle and incremental injection   Block outcome:  Anesthesia achieved Laceration details:    Location:  Finger   Finger location:  L thumb (and L index finger)   Length (cm):  5   Depth (mm):  5 Pre-procedure details:    Preparation:  Patient was prepped and draped in usual sterile fashion and imaging obtained to evaluate for foreign bodies Exploration:    Hemostasis achieved with:  Tourniquet   Imaging obtained: x-ray     Imaging outcome: foreign body not noted     Wound extent: underlying fracture     Wound extent: no foreign body and no tendon damage     Wound extent comment:  Some loss of sensation across the tip of the thumb Treatment:    Area cleansed with:  Povidone-iodine   Amount of  cleaning:  Extensive   Irrigation solution:  Sterile saline   Irrigation volume:  10 ml   Irrigation method:  Syringe   Debridement:  None Skin repair:    Repair method:  Sutures   Suture size:  4-0   Suture material:  Prolene   Suture technique:  Simple interrupted   Number of sutures:  18 (12 in thumb and 6 and index finger) Approximation:    Approximation:  Close Repair type:    Repair type:  Simple Post-procedure details:    Dressing:  Non-adherent dressing and splint for protection (Splint on thumb for protection of open fracture)   Procedure completion:  Tolerated   MEDICATIONS ORDERED IN ED: Medications  lidocaine (PF) (XYLOCAINE) 1 % injection 5 mL (has no administration in time range)  Tdap (BOOSTRIX) injection 0.5 mL (0.5 mLs Intramuscular Given 06/07/22 1033)  ceFAZolin (ANCEF) IVPB 1 g/50 mL premix (0 g Intravenous Stopped 06/07/22 1133)     IMPRESSION / MDM / ASSESSMENT AND PLAN / ED COURSE  I reviewed the triage vital signs and the nursing notes.                              Differential diagnosis includes, but is not limited to, laceration, open fracture, tendon injury.  Patient's presentation is most consistent with acute, uncomplicated illness.  [name], is a pleasant 82 year old male with cardiac history on Eliquis presents to the ED for a laceration repair of the left thumb and index finger.  I reviewed the patient's chart and medical history.  Left hand x-rays were obtained and independently reviewed by me, they showed evidence of an open fracture of the first distal phalanx.  Patient was given 1 g of Ancef for infection prophylaxis and a tetanus booster.  Prior to laceration repair patient soaked hand in cup of Betadine and sterile water.  A digital block was [name] on the thumb and local anesthetic for the index finger.  Wounds were closed with good approximation using 4-0 Prolene.  Nonadherent dressing applied.  Thumb splint applied.  Patient will be sent  home with antibiotics and medicine for pain control.  Patient was stable for discharge and instructed to follow-up with orthopedics.     FINAL CLINICAL IMPRESSION(S) / ED DIAGNOSES   Final diagnoses:  Laceration of  left thumb without foreign body without damage to nail, initial encounter  Laceration of left index finger, foreign body presence unspecified, nail damage status unspecified, initial encounter  Open displaced fracture of distal phalanx of left thumb, initial encounter     Rx / DC Orders   ED Discharge Orders          Ordered    cephALEXin (KEFLEX) 500 MG capsule  4 times daily        06/07/22 1348    traMADol (ULTRAM) 50 MG tablet  Every 8 hours PRN        06/07/22 1349             Note:  This document was prepared using Dragon voice recognition software and may include unintentional dictation errors.   Cruz Condon, PA-C 06/07/22 1455    Cruz Condon, PA-C 06/07/22 1503    [name], MD 06/07/22 [phone]

## 2022-06-07 NOTE — ED Triage Notes (Signed)
Pt via POV from home. Pt c/o laceration to the L thumb and L index finger. Pt cut it on saw. States he does take Eliquis. Bleeding controlled. Unknown last tetanus. Pt is A&Ox4 and NAD

## 2022-06-07 NOTE — Discharge Instructions (Addendum)
Please keep wound clean and dry.   Follow up with orthopedics for evaluation of the open fracture of your thumb.  They will also be able to help with the suture removal. No use of the thumb until evaluated by orthopedics.

## 2022-09-01 ENCOUNTER — Emergency Department
Admission: EM | Admit: 2022-09-01 | Discharge: 2022-09-01 | Disposition: A | Discharge status: Home / Self Care | Payer: Medicare HMO | Source: Home / Self Care | Attending: Emergency Medicine | Admitting: Emergency Medicine

## 2022-09-01 ENCOUNTER — Emergency Department: Payer: Medicare HMO

## 2022-09-01 ENCOUNTER — Ambulatory Visit
Admission: EM | Admit: 2022-09-01 | Discharge: 2022-09-01 | Disposition: A | Discharge status: Home / Self Care | Payer: Medicare HMO

## 2022-09-01 ENCOUNTER — Other Ambulatory Visit: Payer: Self-pay

## 2022-09-01 DIAGNOSIS — R197 Diarrhea, unspecified: Secondary | ICD-10-CM | POA: Diagnosis not present

## 2022-09-01 DIAGNOSIS — R109 Unspecified abdominal pain: Secondary | ICD-10-CM

## 2022-09-01 DIAGNOSIS — R1011 Right upper quadrant pain: Secondary | ICD-10-CM

## 2022-09-01 DIAGNOSIS — R1031 Right lower quadrant pain: Secondary | ICD-10-CM | POA: Insufficient documentation

## 2022-09-01 LAB — URINALYSIS, ROUTINE W REFLEX MICROSCOPIC
Bilirubin Urine: NEGATIVE
Glucose, UA: NEGATIVE mg/dL
Hgb urine dipstick: NEGATIVE
Ketones, ur: NEGATIVE mg/dL
Leukocytes,Ua: NEGATIVE
Nitrite: NEGATIVE
Protein, ur: NEGATIVE mg/dL
Specific Gravity, Urine: 1.008 (ref 1.005–1.030)
pH: 5 (ref 5.0–8.0)

## 2022-09-01 LAB — COMPREHENSIVE METABOLIC PANEL
ALT: 21 U/L (ref 0–44)
AST: 28 U/L (ref 15–41)
Albumin: 3.5 g/dL (ref 3.5–5.0)
Alkaline Phosphatase: 51 U/L (ref 38–126)
Anion gap: 8 (ref 5–15)
BUN: 21 mg/dL (ref 8–23)
CO2: 22 mmol/L (ref 22–32)
Calcium: 8.7 mg/dL — ABNORMAL LOW (ref 8.9–10.3)
Chloride: 105 mmol/L (ref 98–111)
Creatinine, Ser: 1.17 mg/dL (ref 0.61–1.24)
GFR, Estimated: >60 mL/min (ref >60)
Glucose, Bld: 100 mg/dL — ABNORMAL HIGH (ref 70–99)
Potassium: 4.2 mmol/L (ref 3.5–5.1)
Sodium: 135 mmol/L (ref 135–145)
Total Bilirubin: 1 mg/dL (ref 0.3–1.2)
Total Protein: 7.2 g/dL (ref 6.5–8.1)

## 2022-09-01 LAB — CBC
HCT: 46.2 % (ref 39.0–52.0)
Hemoglobin: 15 g/dL (ref 13.0–17.0)
MCH: 31.4 pg (ref 26.0–34.0)
MCHC: 32.5 g/dL (ref 30.0–36.0)
MCV: 96.9 fL (ref 80.0–100.0)
Platelets: 208 10*3/uL (ref 150–400)
RBC: 4.77 MIL/uL (ref 4.22–5.81)
RDW: 14.4 % (ref 11.5–15.5)
WBC: 6.8 10*3/uL (ref 4.0–10.5)
nRBC: 0 % (ref 0.0–0.2)

## 2022-09-01 LAB — LIPASE, BLOOD: Lipase: 30 U/L (ref 11–51)

## 2022-09-01 MED ORDER — SODIUM CHLORIDE 0.9 % IV BOLUS
1000.0000 mL | Freq: Once | INTRAVENOUS | Status: AC
  Administered 2022-09-01: 1000 mL via INTRAVENOUS

## 2022-09-01 NOTE — ED Triage Notes (Signed)
Pt to ED for diarrhea x 4 days. Reports went to UC and they told him he was having abd pain that he was not aware of and sent to ED.

## 2022-09-01 NOTE — Discharge Instructions (Signed)
Fall 3 primary care doctor for further evaluation of your symptoms.  Return to the ER for new or worsening symptoms.

## 2022-09-01 NOTE — ED Triage Notes (Signed)
Pt presents with diarrhea x4 days, pt states he has not be able to eat or drink much in past 4 days. Taking Pepto bismol with no relief.

## 2022-09-01 NOTE — ED Provider Notes (Signed)
Scott [name]    CSN: [id number] Arrival date & time: 09/01/22  5284      History   Chief Complaint Chief Complaint  Patient presents with   Diarrhea    HPI Scott [name] is a 82 y.o. male, with a medical history of CAD, COPD, CHF, fibrillation. HPI Presents today accompanied by his wife with a 4-day history of watery diarrhea.  Patient reports intermittent abdominal pain since the onset of symptoms.  He is also endorsed poor appetite and endorses at baseline he does not drink a lot of fluids.  Patient reports not recently taking any antibiotic therapy, changes in medication regimen, or changes in diet.  He reports that typically he is constipated however since current symptoms have started he has had watery diarrhea with small stool present.  On examination patient endorsed no tenderness on the left side however when palpation occurred on the right upper and lower quadrant patient had rebound tenderness exhibited frequent discomfort on exam.  Patient is afebrile.  Given severity of tenderness patient has been directed to the emergency department for further workup and evaluation of abdominal pain including blood work and advanced imaging. Past Medical History:  Diagnosis Date   Arthritis    right hip, since a fall   CAD (coronary artery disease)    a. 04/2012 Cath: 3VD->Med Rx;  b. 04/2013 PCI RCA (2 DES); c. 03/2014 PCI: LAD 80 (3.0x23 Xience Alpine DES); d. 06/2014 CABG x 2 ([facility]) LIMA->LAD, VG->OM; e. 12/2014 Cath ([facility]): patent grafts->Med Rx; f. 08/2015 Cath: patent grafts; g. 11/2016 Cath: LM min irregs, LAD 20p, 18m, LCX 100p/m, RCA 20ost, 10p/m/d, VG->OM1 nl, LIMA->dLAD nl; f. 07/2017 MV: No isch, EF 51%.   Cardiomyopathy, ischemic    a. 04/2012 Echo: EF 40-45%;  b. 08/2013 Echo: EF 45-50%; c. 01/2015 Echo: EF 40-45%; d. 07/2016 Echo: EF 60-65%; e. 09/2017 Echo: EF 55-60%, Gr1 DD.   Carotid arterial disease (HCC)    a. 05/2013 Carotid U/S; bilat 40-50% ICA stenosis.   Chronic Chest  Pain    USES NITRO   Chronic systolic CHF (congestive heart failure) (HCC)    a. 01/2015 Echo: EF 40-45%; b. 07/2016 Echo: EF 60-65%; c. 09/2017 Echo: EF 55-60%, Gr1 DD, nl RV fxn.   Cough    Diverticulosis    Dizziness    Dyspnea    WITH EXERTION   Headache 1970's   migraine   History of blood transfusion 2016   post op   Hyperlipidemia    Hypertension    Iron deficiency anemia due to chronic blood loss 09/11/2016   Myocardial infarction [facility])    unsure of when   Pulmonary emboli (HCC) 07/2016   a. On Xarelto.   Reflux esophagitis    Sternal pain    a. 03/2016 s/p Sternal wire removal; b. 05/2016 s/p redo median sternotomy for sternal debridement and sternal plating.   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   Weakness 01/05/2019   Status post cardiac catheterization 12/25/2018   Dizziness  12/07/2017   Orthostatic hypotension 12/07/2017   Chest pain 10/02/2017   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   Iron deficiency anemia due to chronic blood loss 09/11/2016   Pulmonary embolus (HCC) 08/31/2016   History of pulmonary embolism 08/16/2016   Protein S deficiency (HCC) 08/16/2016   Panic disorder    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   Panic attack 08/04/2016   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   Coronary artery disease involving native coronary artery of native heart with angina pectoris with documented spasm (HCC)    Hx of CABG    Ischemic cardiomyopathy    Chronic systolic CHF (congestive heart failure) (HCC)    Chronic Chest  Pain    Coronary artery disease involving coronary bypass graft of native heart with angina pectoris with documented spasm (HCC)    Angina pectoris (HCC)    Anxiety    Hyperlipemia    Chronic diastolic heart failure (HCC) 12/21/2014   Unstable angina (HCC) 12/08/2014   PAF (paroxysmal atrial fibrillation) (HCC) 12/08/2014   Chronic anticoagulation 12/08/2014   Anemia 04/20/2014   Osteoarthritis, 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   Cardiomyopathy, ischemic    Essential hypertension    Bilateral carotid artery disease (HCC)    S/P drug eluting coronary stent placement 05/30/2013   Chest pain with low risk for cardiac etiology 05/05/2013   SOB (shortness of breath) 05/05/2013   Coronary artery disease involving coronary bypass graft of native heart with angina pectoris (HCC) 05/05/2013   Hyperlipidemia 05/05/2013   History of smoking 05/05/2013   Cardiac angina (HCC) 05/05/2013    Past Surgical History:  Procedure Laterality Date   CARDIAC CATHETERIZATION  05/01/2012   CARDIAC CATHETERIZATION  04/2013   [facility];x3 stent   CARDIAC CATHETERIZATION  01/11/15    [facility]   CARDIAC CATHETERIZATION N/A 09/16/2015   Procedure: LEFT HEART CATH AND CORS/GRAFTS ANGIOGRAPHY;  Surgeon: Antonieta Iba, MD;  Location: [facility] INVASIVE CV LAB;  Service: Cardiovascular;  Laterality: N/A;   CARPAL TUNNEL RELEASE     right hand   CATARACT EXTRACTION     LEFT   CATARACT EXTRACTION W/PHACO Left 09/16/2014   Procedure: CATARACT EXTRACTION PHACO AND INTRAOCULAR LENS PLACEMENT (IOC);  Surgeon: Lockie Mola, MD;  Location: [facility];  Service: Ophthalmology;  Laterality: Left;   COLONOSCOPY     COLONOSCOPY WITH PROPOFOL N/A 01/28/2016   Procedure: COLONOSCOPY WITH PROPOFOL;  Surgeon: Christena Deem, MD;  Location: [facility] ENDOSCOPY;  Service: Endoscopy;  Laterality: N/A;   COLONOSCOPY WITH PROPOFOL N/A 08/23/2021   Procedure: COLONOSCOPY WITH PROPOFOL;  Surgeon: Toney Reil, MD;  Location: [facility] ENDOSCOPY;   Service: Gastroenterology;  Laterality: N/A;  REQUESTS 9AM OR LATER   CORONARY ANGIOPLASTY WITH STENT PLACEMENT  04/13/2014   CORONARY ARTERY BYPASS GRAFT  06-26-14   x3 bypasses   CORONARY PRESSURE/FFR WITH 3D MAPPING N/A 12/10/2018   Procedure: Mellody Dance PRESSURE WIRE/FFR STUDY;  Surgeon: Marcina Millard, MD;  Location: [facility] INVASIVE CV LAB;  Service: Cardiovascular;  Laterality: N/A;   CORONARY ULTRASOUND/IVUS N/A 12/10/2018   Procedure: Intravascular Ultrasound/IVUS;  Surgeon: Marcina Millard, MD;  Location: [facility] INVAS[facility] CV LAB;  Service: Cardiovascular;  Laterality: N/A;   CYSTOSCOPY N/A 08/14/2018   Procedure: CYSTOSCOPY;  Surgeon: Riki Altes, MD;  Location: [facility] ORS;  Service: Urology;  Laterality: N/A;   DE QUERVAIN'S RELEASE Left 08/22/2012   ESOPHAGOGASTRODUODENOSCOPY (EGD) WITH PROPOFOL N/A 04/26/2015   Procedure: ESOPHAGOGASTRODUODENOSCOPY (EGD) WITH PROPOFOL;  Surgeon: Wallace Cullens, MD;  Location: [facility] ENDOSCOPY;  Service: Gastroenterology;  Laterality: N/A;   ESOPHAGOGASTRODUODENOSCOPY (EGD) WITH PROPOFOL N/A 05/29/2017   Procedure: ESOPHAGOGASTRODUODENOSCOPY (EGD) WITH PROPOFOL;  Surgeon: Christena Deem, MD;  Location: [facility] ENDOSCOPY;  Service: Endoscopy;  Laterality: N/A;   LEFT HEART CATH AND CORONARY ANGIOGRAPHY N/A 12/04/2016   Procedure: LEFT HEART CATH AND CORS/GRAFTS  ANGIOGRAPHY;  Surgeon: Iran Ouch, MD;  Location: [facility] INVAS[facility] CV LAB;  Service: Cardiovascular;  Laterality: N/A;   LEFT HEART CATH AND CORS/GRAFTS ANGIOGRAPHY N/A 04/09/2018   Procedure: LEFT HEART CATH AND CORS/GRAFTS ANGIOGRAPHY;  Surgeon: Marcina Millard, MD;  Location: [facility] INV[facility];  Service: Cardiovascular;  Laterality: N/A;   LEFT HEART CATH AND CORS/GRAFTS ANGIOGRAPHY N/A 12/10/2018   Procedure: LEFT HEART CATH AND CORS/GRAFTS ANGIOGRAPHY;  Surgeon: Marcina Millard, MD;  Location: [facility] INVAS[facility] CV LAB;  Service: Cardiovascular;  Laterality: N/A;   LEFT HEART CATH  AND CORS/GRAFTS ANGIOGRAPHY Left 05/25/2020   Procedure: LEFT HEART CATH AND CORS/GRAFTS ANGIOGRAPHY;  Surgeon: Marcina Millard, MD;  Location: [facility] INV[facility] CV LAB;  Service: Cardiovascular;  Laterality: Left;   RIB PLATING N/A 05/25/2016   Procedure: STERNAL PLATING;  Surgeon: Loreli Slot, MD;  Location[facility] OR;  Service: Thoracic;  Laterality: N/A;   right shoulder     STERNAL WIRES REMOVAL N/A 03/30/2016   Procedure: STERNAL WIRES REMOVAL;  Surgeon: Loreli Slot, MD;  Location: [facility] OR;  Service: Thoracic;  Laterality: N/A;   TONSILLECTOMY         Home Medications    Prior to Admission medications   Medication Sig Start Date End Date Taking? Authorizing Provider  atorvastatin (LIPITOR) 80 MG tablet Take by mouth. 10/20/20  Yes [provider]  ELIQUIS 2.5 MG TABS tablet Take 2.5 mg by mouth 2 (two) times daily. 03/19/19  Yes [provider]  ezetimibe (ZETIA) 10 MG tablet Take 1 tablet (10 mg total) by mouth daily. Patient taking differently: Take 10 mg by mouth at bedtime. 12/07/17  Yes Antonieta Iba, MD  isosorbide mononitrate (IMDUR) 60 MG 24 hr tablet Take 60 mg by mouth 2 (two) times daily.    Yes [provider]  ranolazine (RANEXA) 1000 MG SR tablet Take 1 tablet (1,000 mg total) by mouth 2 (two) times daily. 12/07/17  Yes Antonieta Iba, MD  metoprolol succinate (TOPROL-XL) 25 MG 24 hr tablet Take 25 mg by mouth every evening. 03/18/19   [provider]  traMADol (ULTRAM) 50 MG tablet Take 1 tablet (50 mg total) by mouth every 8 (eight) hours as needed for moderate pain. 06/07/22 06/07/23  Tommi Rumps, PA-C    Family History Family History  Problem Relation Age of Onset   Heart attack Father 34       MI   Cancer Maternal Uncle     Social History Social History   Tobacco Use   Smoking status: Former    Current packs/day: 0.00    Average packs/day: 1 pack/day for 35.0 years (35.0 ttl pk-yrs)    Types:  Cigarettes    Start date: 72    Quit date: 2009    Years since quitting: 15.6   Smokeless tobacco: Former  Building services engineer status: Never Used  Substance Use Topics   Alcohol use: No   Drug use: No     Allergies   Contrast media [iodinated contrast media], Metrizamide, Oxycodone hcl, and Vicodin [hydrocodone-acetaminophen]   Review of Systems Review of Systems   Physical Exam Triage Vital Signs  ED Triage Vitals  Encounter Vitals Group     BP 09/01/22 0957 107/62     Systolic BP Percentile --      Diastolic BP Percentile --      Pulse Rate 09/01/22 0957 (!) 56     Resp 09/01/22 0957 18     Temp 09/01/22 0957 97.9 F (36.6 C)     Temp src --      SpO2 09/01/22 0957 96 %     Weight --      Height --      Head Circumference --      Peak Flow --      Pain Score 09/01/22 1003 0     Pain Loc --      Pain Education --      Exclude from Growth Chart --    No data found.  Updated Vital Signs BP 107/62   Pulse (!) 56   Temp 97.9 F (36.6 C)   Resp 18   SpO2 96%   Visual Acuity Right Eye Distance:   Left Eye Distance:   Bilateral Distance:    Right Eye Near:   Left Eye Near:    Bilateral Near:     Physical Exam Constitutional:      Comments: Chronically ill appearing   Cardiovascular:     Rate and Rhythm: Regular rhythm. Tachycardia present.  Pulmonary:     Effort: Pulmonary effort is normal.     Breath sounds: Normal breath sounds and air entry.  Abdominal:     General: Abdomen is protuberant. There is no distension.     Palpations: Abdomen is soft. There is no shifting dullness, hepatomegaly or mass.     Tenderness: There is abdominal tenderness in the right upper quadrant, right lower quadrant and suprapubic area. There is right CVA tenderness, guarding and rebound.     Hernia: No hernia is present.  Musculoskeletal:     Cervical back: Normal range of motion and neck supple.  Neurological:     Mental Status: He is alert.      UC  Treatments / Results  Labs (all labs ordered are listed, but only abnormal results are displayed) Labs Reviewed - No data to display  EKG   Radiology No results found.  Procedures Procedures (including critical care time)  Medications Ordered in UC Medications - No data to display  Initial Impression / Assessment and Plan / UC Course  I have reviewed the triage vital signs and the nursing notes.  Pertinent labs & imaging results that were available during my care of the patient were reviewed by me and considered in my medical decision making (see chart for details).    Based on medical evaluation patient has been advised to go immediately to [facility] emergency department for further workup and evaluation of right upper and lower quadrant abdominal pain with watery diarrhea.  Patient is stable for transport and personal automobile.  Verbalized understanding the importance of following up at the emergency department rule out active gallbladder infection versus appendicitis.  Final Clinical Impressions(s) / UC Diagnoses   Final diagnoses:  Right lower quadrant abdominal pain  Abdominal pain, right upper quadrant  Watery diarrhea     Discharge Instructions      You are tender in the right upper and lower quadrant of your abdomen. I am concerned for possible gallbladder or appendicitis infection.  This requires further workup and evaluation in the setting of the emergency room including blood work and  imaging Go immediately to [facility] for further workup and evaluation of your symptoms.   ED Prescriptions   None    PDMP not reviewed this encounter.   Bing Neighbors, NP 09/01/22 1051

## 2022-09-01 NOTE — Discharge Instructions (Signed)
You are tender in the right upper and lower quadrant of your abdomen. I am concerned for possible gallbladder or appendicitis infection.  This requires further workup and evaluation in the setting of the emergency room including blood work and imaging Go immediately to [facility] for further workup and evaluation of your symptoms.

## 2022-09-01 NOTE — ED Provider Notes (Signed)
[facility] Provider Note    Event Date/Time   First MD Initiated Contact with Patient 09/01/22 1140     (approximate)   History   Diarrhea and Abdominal Pain   HPI  Scott [name] is a 82 y.o. male presenting to the emergency department for evaluation of diarrhea.  Patient reports that for the past several days he has had multiple episodes of watery diarrhea.  No recent travel, fevers.  Reports some decreased appetite, no vomiting.  He was initially seen at urgent care where was noted to have some right upper and lower quadrant abdominal pain, so he was sent to the ER for further evaluation.      Physical Exam   Triage Vital Signs: ED Triage Vitals  Encounter Vitals Group     BP 09/01/22 1057 (!) 154/71     Systolic BP Percentile --      Diastolic BP Percentile --      Pulse Rate 09/01/22 1057 (!) 52     Resp 09/01/22 1057 18     Temp 09/01/22 1102 97.7 F (36.5 C)     Temp src --      SpO2 09/01/22 1057 100 %     Weight 09/01/22 1056 175 lb (79.4 kg)     Height 09/01/22 1056 5\' 10"  (1.778 m)     Head Circumference --      Peak Flow --      Pain Score 09/01/22 1056 0     Pain Loc --      Pain Education --      Exclude from Growth Chart --     Most recent vital signs: Vitals:   09/01/22 1057 09/01/22 1102  BP: (!) 154/71   Pulse: (!) 52   Resp: 18   Temp:  97.7 F (36.5 C)  SpO2: 100%      General: Awake, interactive  CV:  Mild bradycardia, good peripheral perfusion.  Resp:  Lungs clear, unlabored respirations.  Abd:  Soft, nondistended, tender to palpation in the right upper quadrant as well as in the right lower quadrant, remainder of abdomen nontender, no guarding or rebound Neuro:  Symmetric facial movement, fluid speech   ED Results / Procedures / Treatments   Labs (all labs ordered are listed, but only abnormal results are displayed) Labs Reviewed  COMPREHENSIVE METABOLIC PANEL - Abnormal; Notable for the following  components:      Result Value   Glucose, Bld 100 (*)    Calcium 8.7 (*)    All other components within normal limits  URINALYSIS, ROUTINE W REFLEX MICROSCOPIC - Abnormal; Notable for the following components:   Color, Urine YELLOW (*)    APPearance CLEAR (*)    All other components within normal limits  SARS CORONAVIRUS 2 BY RT PCR  C DIFFICILE QUICK SCREEN W PCR REFLEX    GASTROINTESTINAL PANEL BY PCR, STOOL (REPLACES STOOL CULTURE)  LIPASE, BLOOD  CBC     EKG EKG independently reviewed interpreted by myself (ER attending) demonstrates:    RADIOLOGY Imaging independently reviewed and interpreted by myself demonstrates:  Right upper quadrant ultrasound without evidence of gallstones CT abdomen/pelvis without evidence of bowel obstruction   PROCEDURES:  Critical Care performed: No  Procedures   MEDICATIONS ORDERED IN ED: Medications  sodium chloride 0.9 % bolus 1,000 mL (0 mLs Intravenous Stopped 09/01/22 1403)     IMPRESSION / MDM / ASSESSMENT AND PLAN / ED COURSE  I reviewed the triage  vital signs and the nursing notes.  Differential diagnosis includes, but is not limited to, biliary pathology, appendicitis, viral illness, diverticulitis, other acute intra-abdominal process  Patient's presentation is most consistent with acute presentation with potential threat to life or bodily function.  82 year old male presenting with diarrhea and abdominal pain.  Declined pain or nausea medication on presentation.  Ordered for IV fluids.  Labs overall reassuring.  Urine without evidence of infection.  Imaging reassuring.  Patient reevaluated and is without new complaints.  He is eager to discharge home.  Do think this is reasonable given his reassuring workup.  Strict return precautions provided.  Patient discharged in stable condition.      FINAL CLINICAL IMPRESSION(S) / ED DIAGNOSES   Final diagnoses:  Diarrhea, unspecified type  Right sided abdominal pain     Rx / DC  Orders   ED Discharge Orders     None        Note:  This document was prepared using Dragon voice recognition software and may include unintentional dictation errors.   [name], MD 09/01/22 1537

## 2022-09-01 NOTE — ED Notes (Signed)
Patient is being discharged from the Urgent Care and sent to the Emergency Department via [facility] . Per provider Laurel Dimmer., patient is in need of higher level of care due to tenderness in RUQ & RLQ with diarrhea x4 days. Patient is aware and verbalizes understanding of plan of care.  Vitals:   09/01/22 0957  BP: 107/62  Pulse: (!) 56  Resp: 18  Temp: 97.9 F (36.6 C)  SpO2: 96%

## 2022-12-08 ENCOUNTER — Other Ambulatory Visit: Payer: Self-pay

## 2022-12-08 ENCOUNTER — Emergency Department: Payer: Medicare HMO

## 2022-12-08 ENCOUNTER — Emergency Department
Admission: EM | Admit: 2022-12-08 | Discharge: 2022-12-08 | Disposition: A | Discharge status: Home / Self Care | Payer: Medicare HMO | Attending: Emergency Medicine | Admitting: Emergency Medicine

## 2022-12-08 DIAGNOSIS — Z86711 Personal history of pulmonary embolism: Secondary | ICD-10-CM | POA: Diagnosis not present

## 2022-12-08 DIAGNOSIS — Z7901 Long term (current) use of anticoagulants: Secondary | ICD-10-CM | POA: Insufficient documentation

## 2022-12-08 DIAGNOSIS — R0602 Shortness of breath: Secondary | ICD-10-CM | POA: Diagnosis not present

## 2022-12-08 DIAGNOSIS — R079 Chest pain, unspecified: Secondary | ICD-10-CM | POA: Insufficient documentation

## 2022-12-08 LAB — BASIC METABOLIC PANEL
Anion gap: 8 (ref 5–15)
BUN: 18 mg/dL (ref 8–23)
CO2: 25 mmol/L (ref 22–32)
Calcium: 8.8 mg/dL — ABNORMAL LOW (ref 8.9–10.3)
Chloride: 104 mmol/L (ref 98–111)
Creatinine, Ser: 1.24 mg/dL (ref 0.61–1.24)
GFR, Estimated: 59 mL/min — ABNORMAL LOW (ref >60)
Glucose, Bld: 121 mg/dL — ABNORMAL HIGH (ref 70–99)
Potassium: 4.4 mmol/L (ref 3.5–5.1)
Sodium: 137 mmol/L (ref 135–145)

## 2022-12-08 LAB — CBC
HCT: 42.5 % (ref 39.0–52.0)
Hemoglobin: 13.9 g/dL (ref 13.0–17.0)
MCH: 31.9 pg (ref 26.0–34.0)
MCHC: 32.7 g/dL (ref 30.0–36.0)
MCV: 97.5 fL (ref 80.0–100.0)
Platelets: 213 10*3/uL (ref 150–400)
RBC: 4.36 MIL/uL (ref 4.22–5.81)
RDW: 13.3 % (ref 11.5–15.5)
WBC: 6.1 10*3/uL (ref 4.0–10.5)
nRBC: 0 % (ref 0.0–0.2)

## 2022-12-08 LAB — BRAIN NATRIURETIC PEPTIDE: B Natriuretic Peptide: 112.6 pg/mL — ABNORMAL HIGH (ref 0.0–100.0)

## 2022-12-08 LAB — TROPONIN I (HIGH SENSITIVITY)
Troponin I (High Sensitivity): 11 ng/L (ref <18)
Troponin I (High Sensitivity): 11 ng/L (ref <18)

## 2022-12-08 MED ORDER — ONDANSETRON HCL 4 MG/2ML IJ SOLN
4.0000 mg | Freq: Once | INTRAMUSCULAR | Status: AC
  Administered 2022-12-08: 4 mg via INTRAVENOUS
  Filled 2022-12-08: qty 2

## 2022-12-08 MED ORDER — ASPIRIN 81 MG PO CHEW
324.0000 mg | CHEWABLE_TABLET | Freq: Once | ORAL | Status: AC
  Administered 2022-12-08: 324 mg via ORAL
  Filled 2022-12-08: qty 4

## 2022-12-08 MED ORDER — MORPHINE SULFATE (PF) 4 MG/ML IV SOLN
4.0000 mg | Freq: Once | INTRAVENOUS | Status: AC
  Administered 2022-12-08: 4 mg via INTRAVENOUS
  Filled 2022-12-08: qty 1

## 2022-12-08 MED ORDER — ACETAMINOPHEN 500 MG PO TABS
1000.0000 mg | ORAL_TABLET | Freq: Once | ORAL | Status: AC
  Administered 2022-12-08: 1000 mg via ORAL
  Filled 2022-12-08: qty 2

## 2022-12-08 NOTE — ED Notes (Signed)
Pt placed on monitor, CCMD called

## 2022-12-08 NOTE — Discharge Instructions (Addendum)
We discussed admission versus going home but given your negative cardiac markers and you are feeling better you have opted to feel comfortable going home.  However if you develop return of this pain or symptoms that are worsening you should return to the ER for repeat evaluation.  Otherwise call your cardiologist on Monday to make a follow-up appointment  Return to the ER for worsening pain, fevers, shortness of breath or any other concerns

## 2022-12-08 NOTE — ED Provider Triage Note (Signed)
Emergency Medicine Provider Triage Evaluation Note  Scott [name] , a 82 y.o. male  was evaluated in triage.  Pt complains of chest pain, began at 5:30 am. He took 3 nitros without relief.   Review of Systems  Positive: Chest pain, SOB, headache, dizziness Negative:   Physical Exam  BP 130/61   Pulse (!) 52   Temp 98.6 F (37 C)   Resp 20   Ht 5\' 10"  (1.778 m)   Wt 79.4 kg   SpO2 98%   BMI 25.11 kg/m  Gen:   Awake, no distress   Resp:  Normal effort  MSK:   Moves extremities without difficulty  Other:    Medical Decision Making  Medically screening exam initiated at 11:07 AM.  Appropriate orders placed.  KHARTER REXROTH was informed that the remainder of the evaluation will be completed by another provider, this initial triage assessment does not replace that evaluation, and the importance of remaining in the ED until their evaluation is complete.     Cameron Ali, PA-C 12/08/22 1108

## 2022-12-08 NOTE — ED Triage Notes (Addendum)
Pt to ED with chest pain and shob started this am, woke up out of sleep. Has taken nitroglycerin x3 with no relief. +h/a NAD noted, RR even and unlabored Hx multiple heart cath States pain is worse than other MI

## 2022-12-08 NOTE — ED Provider Notes (Signed)
[facility] Provider Note    Event Date/Time   First MD Initiated Contact with Patient 12/08/22 1127     (approximate)   History   Chest Pain   HPI  Scott [name] is a 82 y.o. male with paroxysmal A-fib, prior history of PE on Eliquis, coronary disease status post bypass, stent placement followed by Dr. Darrold Junker who comes in with chest pain.  Patient reports chest pain that started around 530 this morning that woke him up from sleep.  He reports it is a stabbing sensation in the center of his chest.  He denies any new shortness of breath.  Denies any new leg swelling.  Physical Exam   Triage Vital Signs: ED Triage Vitals  Encounter Vitals Group     BP 12/08/22 1101 130/61     Systolic BP Percentile --      Diastolic BP Percentile --      Pulse Rate 12/08/22 1101 (!) 52     Resp 12/08/22 1101 20     Temp 12/08/22 1103 98.6 F (37 C)     Temp src --      SpO2 12/08/22 1101 98 %     Weight 12/08/22 1101 175 lb (79.4 kg)     Height 12/08/22 1101 5\' 10"  (1.778 m)     Head Circumference --      Peak Flow --      Pain Score 12/08/22 1101 7     Pain Loc --      Pain Education --      Exclude from Growth Chart --     Most recent vital signs: Vitals:   12/08/22 1101 12/08/22 1103  BP: 130/61   Pulse: (!) 52   Resp: 20   Temp:  98.6 F (37 C)  SpO2: 98%      General: Awake, no distress.  CV:  Good peripheral perfusion.  Resp:  Normal effort.  Abd:  No distention.  Soft and nontender Other:  Some mild swelling (worse on left per patient) no calf tenderness    ED Results / Procedures / Treatments   Labs (all labs ordered are listed, but only abnormal results are displayed) Labs Reviewed  CBC  BASIC METABOLIC PANEL  TROPONIN I (HIGH SENSITIVITY)     EKG  My interpretation of EKG:  Sinus bradycardia with a rate of 51 without any ST elevation or T wave inversions, looks a left bundle branch block and type I AV block  Reviewed  prior EKG and he is got a widened QRS at that time it also looks made with a low bit of a left bundle branch block  RADIOLOGY I have reviewed the xray personally and interpreted and no pneumonia  PROCEDURES:  Critical Care performed: No  .1-3 Lead EKG Interpretation  Performed by: Concha Se, MD Authorized by: Concha Se, MD     Interpretation: abnormal     ECG rate:  50   ECG rate assessment: normal     Rhythm: sinus bradycardia     Ectopy: none     Conduction: normal      MEDICATIONS ORDERED IN ED: Medications  morphine (PF) 4 MG/ML injection 4 mg (4 mg Intravenous Given 12/08/22 1155)  ondansetron (ZOFRAN) injection 4 mg (4 mg Intravenous Given 12/08/22 1155)     IMPRESSION / MDM / ASSESSMENT AND PLAN / ED COURSE  I reviewed the triage vital signs and the nursing notes.   Patient's presentation  is most consistent with acute presentation with potential threat to life or bodily function.   Patient comes in with chest pain.  Patient had significant cardiac history.  Do not see where he has been seen in the ER for chest pain recently.  He is already on blood thinner so doubt PE.  I reviewed where he had a catheterization last done in March 2022 by Dr. Adrienne Mocha.  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  He is already on a blood thinner and this is been ongoing for over a year and he denies any missed doses of Eliquis and at this time declined an ultrasound.  1.  Three-vessel coronary artery disease 2.  Patent stents mid LAD, and proximal and mid RCA 3.  Patent LIMA to distal LAD, patent SVG to ramus intermedius 4.  Nonobstructive stenosis proximal LAD 5.  Chronic diffuse 80% stenosis ostial small caliber left circumflex with occluded OM 2 with ipsi-collaterals from ramus intermedius branch to OM2 6.  Mildly reduced left ventricular  function, with posterior wall akinesis, estimated LVEF 40%  Troponin was negative.  BMP reassuring CBC normal  12:26 PM reevaluated patient he still is having the discomfort he reports that the morphine kind of made him feel woozy and like he was drunk.  He denies any significant headache.  He reported a mild headache after the nitro of this, feels similar.  Will give a dose of Tylenol.  Reevaluated the patient does not have any headache any longer so suspect from the nitro.  He denies any significant pain at this time.  We discussed admission for cardiac monitoring, echo given his risk factors versus going home given his negative troponins.  Patient states that he lives close and he would prefer to go home.  He understands that he is high risk for heart problems and if things are changing he can return to the ER but at this time he feels comfortable with discharge home.  We discussed that he is already on maximum therapy with Imdur and Ranexa and to continue take these medications and to call his cardiologist on Monday to try to get a follow-up.  He understands this and will come back if the pain is returning for repeat evaluation consideration of admission but at this time he is opted to want to go home.   The patient is on the cardiac monitor to evaluate for evidence of arrhythmia and/or significant heart rate changes.      FINAL CLINICAL IMPRESSION(S) / ED DIAGNOSES   Final diagnoses:  Nonspecific chest pain     Rx / DC Orders   ED Discharge Orders     None        Note:  This document was prepared using Dragon voice recognition software and may include unintentional dictation errors.   Concha Se, MD 12/08/22 1500

## 2023-01-01 ENCOUNTER — Telehealth: Payer: Self-pay | Admitting: *Deleted

## 2023-01-01 NOTE — Telephone Encounter (Signed)
Patient called asking if he can come in for lab check as he is feeling his counts are down . Having chills and feeling dizzy like he does when they drop. He was last seen in May 2023 and no follow up appointment was scheduled ordered. Please advise

## 2023-01-02 ENCOUNTER — Encounter: Payer: Self-pay | Admitting: Oncology

## 2023-01-02 ENCOUNTER — Other Ambulatory Visit: Payer: Self-pay

## 2023-01-02 DIAGNOSIS — Z862 Personal history of diseases of the blood and blood-forming organs and certain disorders involving the immune mechanism: Secondary | ICD-10-CM

## 2023-01-02 NOTE — Telephone Encounter (Signed)
Pt will need to re-establish care.  Lab/MD  (cbc, lft, iron, ferr)- just next available.

## 2023-01-03 ENCOUNTER — Inpatient Hospital Stay (HOSPITAL_BASED_OUTPATIENT_CLINIC_OR_DEPARTMENT_OTHER): Payer: Medicare HMO | Admitting: Oncology

## 2023-01-03 ENCOUNTER — Inpatient Hospital Stay: Payer: Medicare HMO | Attending: Oncology

## 2023-01-03 ENCOUNTER — Encounter: Payer: Self-pay | Admitting: Oncology

## 2023-01-03 VITALS — BP 118/68 | HR 53 | Temp 96.1°F | Resp 18 | Wt 174.5 lb

## 2023-01-03 DIAGNOSIS — Z862 Personal history of diseases of the blood and blood-forming organs and certain disorders involving the immune mechanism: Secondary | ICD-10-CM

## 2023-01-03 DIAGNOSIS — Z7901 Long term (current) use of anticoagulants: Secondary | ICD-10-CM | POA: Diagnosis not present

## 2023-01-03 DIAGNOSIS — I480 Paroxysmal atrial fibrillation: Secondary | ICD-10-CM | POA: Insufficient documentation

## 2023-01-03 DIAGNOSIS — E611 Iron deficiency: Secondary | ICD-10-CM

## 2023-01-03 DIAGNOSIS — D509 Iron deficiency anemia, unspecified: Secondary | ICD-10-CM | POA: Insufficient documentation

## 2023-01-03 DIAGNOSIS — R0602 Shortness of breath: Secondary | ICD-10-CM

## 2023-01-03 DIAGNOSIS — Z86711 Personal history of pulmonary embolism: Secondary | ICD-10-CM | POA: Insufficient documentation

## 2023-01-03 DIAGNOSIS — I2699 Other pulmonary embolism without acute cor pulmonale: Secondary | ICD-10-CM | POA: Diagnosis not present

## 2023-01-03 LAB — CBC WITH DIFFERENTIAL (CANCER CENTER ONLY)
Abs Immature Granulocytes: 0.02 10*3/uL (ref 0.00–0.07)
Basophils Absolute: 0 10*3/uL (ref 0.0–0.1)
Basophils Relative: 1 %
Eosinophils Absolute: 0.1 10*3/uL (ref 0.0–0.5)
Eosinophils Relative: 1 %
HCT: 40.2 % (ref 39.0–52.0)
Hemoglobin: 13.3 g/dL (ref 13.0–17.0)
Immature Granulocytes: 0 %
Lymphocytes Relative: 32 %
Lymphs Abs: 1.9 10*3/uL (ref 0.7–4.0)
MCH: 31.9 pg (ref 26.0–34.0)
MCHC: 33.1 g/dL (ref 30.0–36.0)
MCV: 96.4 fL (ref 80.0–100.0)
Monocytes Absolute: 0.5 10*3/uL (ref 0.1–1.0)
Monocytes Relative: 8 %
Neutro Abs: 3.5 10*3/uL (ref 1.7–7.7)
Neutrophils Relative %: 58 %
Platelet Count: 214 10*3/uL (ref 150–400)
RBC: 4.17 MIL/uL — ABNORMAL LOW (ref 4.22–5.81)
RDW: 13.8 % (ref 11.5–15.5)
WBC Count: 6 10*3/uL (ref 4.0–10.5)
nRBC: 0 % (ref 0.0–0.2)

## 2023-01-03 LAB — IRON AND TIBC
Iron: 55 ug/dL (ref 45–182)
Saturation Ratios: 14 % — ABNORMAL LOW (ref 17.9–39.5)
TIBC: 382 ug/dL (ref 250–450)
UIBC: 327 ug/dL

## 2023-01-03 LAB — FERRITIN: Ferritin: 9 ng/mL — ABNORMAL LOW (ref 24–336)

## 2023-01-03 LAB — HEPATIC FUNCTION PANEL
ALT: 16 U/L (ref 0–44)
AST: 19 U/L (ref 15–41)
Albumin: 3.5 g/dL (ref 3.5–5.0)
Alkaline Phosphatase: 47 U/L (ref 38–126)
Bilirubin, Direct: 0.2 mg/dL (ref 0.0–0.2)
Indirect Bilirubin: 0.5 mg/dL (ref 0.3–0.9)
Total Bilirubin: 0.7 mg/dL (ref <1.2)
Total Protein: 6.7 g/dL (ref 6.5–8.1)

## 2023-01-03 NOTE — Assessment & Plan Note (Signed)
Labs are reviewed and discussed with patient. Lab Results  Component Value Date   HGB 13.3 01/03/2023   TIBC 382 01/03/2023   IRONPCTSAT 14 (L) 01/03/2023   FERRITIN 9 (L) 01/03/2023    Hemoglobin is within normal limit.  Iron panel is consistent with iron deficiency. He is not able to tolerate oral iron supplementation Previously tolerated IV Venofer. Recommend IV Venofer weekly x 3

## 2023-01-03 NOTE — Progress Notes (Signed)
Hematology/Oncology Progress note [phone] [phone]     Patient Care Team: [name], MD as PCP - General (Internal Medicine) Mariah Milling Tollie Pizza, MD as PCP - Cardiology (Cardiology) Rafael Bihari, MD (Internal Medicine) Delma Freeze, FNP as Nurse Practitioner (Cardiology) Antonieta Iba, MD as Consulting Physician (Cardiology) Rickard Patience, MD as Consulting Physician (Oncology)  REASON FOR VISIT Follow up for iron deficiency anemia, pulmonary embolism   ASSESSMENT & PLAN:   Iron deficiency Labs are reviewed and discussed with patient. Lab Results  Component Value Date   HGB 13.3 01/03/2023   TIBC 382 01/03/2023   IRONPCTSAT 14 (L) 01/03/2023   FERRITIN 9 (L) 01/03/2023    Hemoglobin is within normal limit.  Iron panel is consistent with iron deficiency. He is not able to tolerate oral iron supplementation Previously tolerated IV Venofer. Recommend IV Venofer weekly x 3  Pulmonary embolus (HCC) History of PE  Recommend patient to continue Eliquis 2.5mg  BID.   Eliquis prescription is not managed by me.  SOB (shortness of breath) Multifactorial. Fatigue and shortness of breath level is out of proportion to his normal hemoglobin and mild iron deficiency. Likely secondary to his CAD and emphysema. Recommend patient to follow-up with cardiology and pulmonologist.   Orders Placed This Encounter  Procedures   Ferritin    Standing Status:   Future    Standing Expiration Date:   07/04/2023   Iron and TIBC    Standing Status:   Future    Standing Expiration Date:   01/03/2024   CBC with Differential/Platelet    Standing Status:   Future    Standing Expiration Date:   01/03/2024   Follow-up in 4 months. All questions were answered. The patient knows to call the clinic with any problems, questions or concerns.  Rickard Patience, MD, PhD [facility] Hematology Oncology 01/03/2023  '   HISTORY OF PRESENTING ILLNESS: Scott [name] 82  y.o. male with PMH listed as below who is referred to me for evaluation and management of pulmonary embolism.  Scott [name] has a history paroxymal atrial fibrillation on Eliquis 5mg  BID until recently, history of CAD s/p CABG x 2 at [facility] in 2016 with chronic chest pain, s/p removal of sternal wire on 03/2016,  s/p sternal debridement and plating on 05/25/2016 in the setting of CT chest performed by pain management showing sternal nonunion of the manubrium with subsequent improvement in his pain. Perioperatively his anticoagulation with Eliquis was discontinue a few days prior surgery and resumed after surgery. Operation note mentioned that during the procedure, the left lung was densely adherent to the underside of the sternum and tear in the lung did occur and was sutured. He presents  to [facility] on 08/04/2016 for evaluation of SOB and was found to have a PE. Patient states that he was compliant with Eliquis.  CTA chest showed left upper lobe PE extending into the left main pulmonary artery. His Eliquis was changed to Xarelto. Lower extremity dopplers were negative for DVT. Hypercoagulable workup was sent and all tests were negative except a mild low protein S function.  No formal cancer workup.  Sed rate normal, heavy metals negative, TSH mildly reduced at 0.253. 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     Hypercoagulable work up: discussed with patient. He is tested negative for factor 5 leiden mutation, prothrombin mutation, anti phospholipin antibody panels. Normal protein C  activity. Normal total protein S, mildly low protein S function, can be falsely low in the acute setting of thrombosis.Congential protein S deficiency is unlikely given that he has no prior history of VTE. Repeat protein S total and free were normal.   # He has had one dose of Feraheme and during the treatment he had a chest tightness and end of infusion. Has  received IV iron Venofer and tolerates well.   #chronic intermittent chest pain and shortness of breath that has been previously extensively worked up.  He follows up with Dr. Elonda Husky for his heart care.  Extensive cardiology disease CAD status post prior RCA and LAD stenting as well as CABG x2 chronic chest pain status post sternal wire removal with sternal debridement and plating, ischemia cardiomyopathy, chronic dyspnea.  # CT angios chest PE protocol done on 11/07/2016 showed no lung nodules or mass.  INTERVAL HISTORY Patient presents for follow-up of chronic anticoagulation, iron deficiency present for follow up .  + Chronic fatigue and shortness of breath. Patient reports that he has been feeling weak and shortness of breath is worse. Denies hematochezia, hematuria, hematemesis, epistaxis, black tarry stool  experiencing insomnia, often waking up at 2 AM and unable to return to sleep. He denies feelings of depression, but acknowledges a worsening of his overall mood.  He has been compliant with his medication regimen, including Eliquis, and has not missed any doses.  Review of Systems  Constitutional:  Positive for fatigue. Negative for appetite change, chills, fever and unexpected weight change.  HENT:   Negative for hearing loss and voice change.   Eyes:  Negative for eye problems and icterus.  Respiratory:  Positive for shortness of breath. Negative for chest tightness and cough.   Cardiovascular:  Negative for chest pain and leg swelling.  Gastrointestinal:  Negative for abdominal distention and abdominal pain.  Endocrine: Negative for hot flashes.  Genitourinary:  Negative for difficulty urinating, dysuria and frequency.   Musculoskeletal:  Negative for arthralgias.  Skin:  Negative for itching and rash.  Neurological:  Negative for light-headedness and numbness.  Hematological:  Negative for adenopathy. Does not bruise/bleed easily.  Psychiatric/Behavioral:  Negative for  confusion.     MEDICAL HISTORY: Past Medical History:  Diagnosis Date   Arthritis    right hip, since a fall   CAD (coronary artery disease)    a. 04/2012 Cath: 3VD->Med Rx;  b. 04/2013 PCI RCA (2 DES); c. 03/2014 PCI: LAD 80 (3.0x23 Xience Alpine DES); d. 06/2014 CABG x 2 ([facility]) LIMA->LAD, VG->OM; e. 12/2014 Cath ([facility]): patent grafts->Med Rx; f. 08/2015 Cath: patent grafts; g. 11/2016 Cath: LM min irregs, LAD 20p, 42m, LCX 100p/m, RCA 20ost, 10p/m/d, VG->OM1 nl, LIMA->dLAD nl; f. 07/2017 MV: No isch, EF 51%.   Cardiomyopathy, ischemic    a. 04/2012 Echo: EF 40-45%;  b. 08/2013 Echo: EF 45-50%; c. 01/2015 Echo: EF 40-45%; d. 07/2016 Echo: EF 60-65%; e. 09/2017 Echo: EF 55-60%, Gr1 DD.   Carotid arterial disease (HCC)    a. 05/2013 Carotid U/S; bilat 40-50% ICA stenosis.   Chronic Chest Pain    USES NITRO   Chronic systolic CHF (congestive heart failure) (HCC)    a. 01/2015 Echo: EF 40-45%; b. 07/2016 Echo: EF 60-65%; c. 09/2017 Echo: EF 55-60%, Gr1 DD, nl RV fxn.   Cough    Diverticulosis    Dizziness    Dyspnea    WITH EXERTION   Headache 1970's   migraine   History of blood  transfusion 2016   post op   Hyperlipidemia    Hypertension    Iron deficiency anemia due to chronic blood loss 09/11/2016   Myocardial infarction [facility])    unsure of when   Pulmonary emboli (HCC) 07/2016   a. On Xarelto.   Reflux esophagitis    Sternal pain    a. 03/2016 s/p Sternal wire removal; b. 05/2016 s/p redo median sternotomy for sternal debridement and sternal plating.   Syncope 04/2018   CARDIOLOGIST WAS DR Mariah Milling AND HE WAS AWARE-NOW PT SEES PARASCHOS   Tubular adenoma of colon     SURGICAL HISTORY: Past Surgical History:  Procedure Laterality Date   CARDIAC CATHETERIZATION  05/01/2012   CARDIAC CATHETERIZATION  04/2013   [facility];x3 stent   CARDIAC CATHETERIZATION  01/11/15    [facility]   CARDIAC CATHETERIZATION N/A 09/16/2015   Procedure: LEFT HEART CATH AND CORS/GRAFTS ANGIOGRAPHY;  Surgeon: Antonieta Iba,  MD;  Location: [facility] INVASIVE CV LAB;  Service: Cardiovascular;  Laterality: N/A;   CARPAL TUNNEL RELEASE     right hand   CATARACT EXTRACTION     LEFT   CATARACT EXTRACTION W/PHACO Left 09/16/2014   Procedure: CATARACT EXTRACTION PHACO AND INTRAOCULAR LENS PLACEMENT (IOC);  Surgeon: Lockie Mola, MD;  Location: [facility];  Service: Ophthalmology;  Laterality: Left;   COLONOSCOPY     COLONOSCOPY WITH PROPOFOL N/A 01/28/2016   Procedure: COLONOSCOPY WITH PROPOFOL;  Surgeon: Christena Deem, MD;  Location[facility] ENDOSCOPY;  Service: Endoscopy;  Laterality: N/A;   COLONOSCOPY WITH PROPOFOL N/A 08/23/2021   Procedure: COLONOSCOPY WITH PROPOFOL;  Surgeon: Toney Reil, MD;  Location: [facility] ENDOSCOPY;  Service: Gastroenterology;  Laterality: N/A;  REQUESTS 9AM OR LATER   CORONARY ANGIOPLASTY WITH STENT PLACEMENT  04/13/2014   CORONARY ARTERY BYPASS GRAFT  06-26-14   x3 bypasses   CORONARY PRESSURE/FFR WITH 3D MAPPING N/A 12/10/2018   Procedure: Mellody Dance PRESSURE WIRE/FFR STUDY;  Surgeon: Marcina Millard, MD;  Location: [facility] INVASIVE CV LAB;  Service: Cardiovascular;  Laterality: N/A;   CORONARY ULTRASOUND/IVUS N/A 12/10/2018   Procedure: Intravascular Ultrasound/IVUS;  Surgeon: Marcina Millard, MD;  Location: [facility]VAS[facility] CV LAB;  Service: Cardiovascular;  Laterality: N/A;   CYSTOSCOPY N/A 08/14/2018   Procedure: CYSTOSCOPY;  Surgeon: Riki Altes, MD;  Location: [facility] ORS;  Service: Urology;  Laterality: N/A;   DE QUERVAIN'S RELEASE Left 08/22/2012   ESOPHAGOGASTRODUODENOSCOPY (EGD) WITH PROPOFOL N/A 04/26/2015   Procedure: ESOPHAGOGASTRODUODENOSCOPY (EGD) WITH PROPOFOL;  Surgeon: Wallace Cullens, MD;  Location: [facility] ENDOSCOPY;  Service: Gastroenterology;  Laterality: N/A;   ESOPHAGOGASTRODUODENOSCOPY (EGD) WITH PROPOFOL N/A 05/29/2017   Procedure: ESOPHAGOGASTRODUODENOSCOPY (EGD) WITH PROPOFOL;  Surgeon: Christena Deem, MD;  Location: [facility] ENDOSCOPY;  Service: Endoscopy;   Laterality: N/A;   LEFT HEART CATH AND CORONARY ANGIOGRAPHY N/A 12/04/2016   Procedure: LEFT HEART CATH AND CORS/GRAFTS  ANGIOGRAPHY;  Surgeon: Iran Ouch, MD;  Location: [facility]VASIVE CV LAB;  Service: Cardiovascular;  Laterality: N/A;   LEFT HEART CATH AND CORS/GRAFTS ANGIOGRAPHY N/A 04/09/2018   Procedure: LEFT HEART CATH AND CORS/GRAFTS ANGIOGRAPHY;  Surgeon: Marcina Millard, MD;  Location: [facility]VAS[facility] CV LAB;  Service: Cardiovascular;  Laterality: N/A;   LEFT HEART CATH AND CORS/GRAFTS ANGIOGRAPHY N/A 12/10/2018   Procedure: LEFT HEART CATH AND CORS/GRAFTS ANGIOGRAPHY;  Surgeon: Marcina Millard, MD;  Location: [facility] INVASIVE CV LAB;  Service: Cardiovascular;  Laterality: N/A;   LEFT HEART CATH AND CORS/GRAFTS ANGIOGRAPHY Left 05/25/2020   Procedure: LEFT HEART CATH AND CORS/GRAFTS  ANGIOGRAPHY;  Surgeon: Marcina Millard, MD;  Location: [facility] INVAS[facility] CV LAB;  Service: Cardiovascular;  Laterality: Left;   RIB PLATING N/A 05/25/2016   Procedure: STERNAL PLATING;  Surgeon: Loreli Slot, MD;  Location[facility] OR;  Service: Thoracic;  Laterality: N/A;   right shoulder     STERNAL WIRES REMOVAL N/A 03/30/2016   Procedure: STERNAL WIRES REMOVAL;  Surgeon: Loreli Slot, MD;  Location: [facility] OR;  Service: Thoracic;  Laterality: N/A;   TONSILLECTOMY      SOCIAL HISTORY: Social History   Socioeconomic History   Marital status: Married    Spouse name: Not on file   Number of children: Not on file   Years of education: Not on file   Highest education level: Not on file  Occupational History   Not on file  Tobacco Use   Smoking status: Former    Current packs/day: 0.00    Average packs/day: 1 pack/day for 35.0 years (35.0 ttl pk-yrs)    Types: Cigarettes    Start date: 44    Quit date: 2009    Years since quitting: 15.9   Smokeless tobacco: Former  Building services engineer status: Never Used  Substance and Sexual Activity   Alcohol use: No   Drug use: No   Sexual  activity: Yes  Other Topics Concern   Not on file  Social History Narrative   Not on file   Social Determinants of Health   Financial Resource Strain: Not on file  Food Insecurity: Not on file  Transportation Needs: Not on file  Physical Activity: Not on file  Stress: Not on file  Social Connections: Not on file  Intimate Partner Violence: Not on file    FAMILY HISTORY Family History  Problem Relation Age of Onset   Heart attack Father 34       MI   Cancer Maternal Uncle     ALLERGIES:  is allergic to contrast media [iodinated contrast media], metrizamide, oxycodone hcl, and vicodin [hydrocodone-acetaminophen].  MEDICATIONS:  Current Outpatient Medications  Medication Sig Dispense Refill   atorvastatin (LIPITOR) 80 MG tablet Take by mouth.     ELIQUIS 2.5 MG TABS tablet Take 2.5 mg by mouth 2 (two) times daily.     ezetimibe (ZETIA) 10 MG tablet Take 1 tablet (10 mg total) by mouth daily. (Patient taking differently: Take 10 mg by mouth at bedtime.) 90 tablet 3   isosorbide mononitrate (IMDUR) 60 MG 24 hr tablet Take 60 mg by mouth 2 (two) times daily.      metoprolol succinate (TOPROL-XL) 25 MG 24 hr tablet Take 25 mg by mouth every evening.     ranolazine (RANEXA) 1000 MG SR tablet Take 1 tablet (1,000 mg total) by mouth 2 (two) times daily. 180 tablet 3   nitroGLYCERIN (NITRO-BID) 2 % ointment Apply 0.5 inches topically daily as needed. (Patient not taking: Reported on 01/03/2023)     Current Facility-Administered Medications  Medication Dose Route Frequency Provider Last Rate Last Admin   lidocaine (XYLOCAINE) 2 % jelly 1 application  1 application  Urethral Once Stoioff, Scott C, MD        PHYSICAL EXAMINATION:  ECOG PERFORMANCE STATUS: 1 - Symptomatic but completely ambulatory   Vitals:   01/03/23 1355  BP: 118/68  Pulse: (!) 53  Resp: 18  Temp: (!) 96.1 F (35.6 C)  SpO2: 100%    Filed Weights   01/03/23 1355  Weight: 174 lb 8 oz (79.2 kg)  Physical Exam Constitutional:      General: He is not in acute distress.    Appearance: He is not diaphoretic.  HENT:     Head: Normocephalic and atraumatic.  Eyes:     General: No scleral icterus. Cardiovascular:     Rate and Rhythm: Normal rate and regular rhythm.  Pulmonary:     Effort: Pulmonary effort is normal. No respiratory distress.     Breath sounds: Normal breath sounds. No wheezing.  Abdominal:     General: Bowel sounds are normal. There is no distension.     Palpations: Abdomen is soft.  Musculoskeletal:        General: Normal range of motion.     Cervical back: Normal range of motion and neck supple.  Skin:    General: Skin is warm and dry.     Findings: No erythema.  Neurological:     Mental Status: He is alert and oriented to person, place, and time. Mental status is at baseline.     Cranial Nerves: No cranial nerve deficit.     Motor: No abnormal muscle tone.  Psychiatric:        Mood and Affect: Affect normal.         LABORATORY DATA: I have personally reviewed the data as listed:  Appointment on 01/03/2023  Component Date Value Ref Range Status   Total Protein 01/03/2023 6.7  6.5 - 8.1 g/dL Final   Albumin 40/98/1191 3.5  3.5 - 5.0 g/dL Final   AST 47/82/9562 19  15 - 41 U/L Final   ALT 01/03/2023 16  0 - 44 U/L Final   Alkaline Phosphatase 01/03/2023 47  38 - 126 U/L Final   Total Bilirubin 01/03/2023 0.7  <1.2 mg/dL Final   Bilirubin, Direct 01/03/2023 0.2  0.0 - 0.2 mg/dL Final   Indirect Bilirubin 01/03/2023 0.5  0.3 - 0.9 mg/dL Final   Performed at [facility], [address] [place], [place] [address]   Iron 01/03/2023 55  45 - 182 ug/dL Final   TIBC 57/84/6962 382  250 - 450 ug/dL Final   Saturation Ratios 01/03/2023 14 (L)  17.9 - 39.5 % Final   UIBC 01/03/2023 327  ug/dL Final   Performed at [facility], [address] [place], [place] [address]   Ferritin 01/03/2023 9 (L)  24 - 336 ng/mL Final   Performed  at [facility], [address] [place], [place] [address]   WBC Count 01/03/2023 6.0  4.0 - 10.5 K/uL Final   RBC 01/03/2023 4.17 (L)  4.22 - 5.81 MIL/uL Final   Hemoglobin 01/03/2023 13.3  13.0 - 17.0 g/dL Final   HCT 01/25/7251 40.2  39.0 - 52.0 % Final   MCV 01/03/2023 96.4  80.0 - 100.0 fL Final   MCH 01/03/2023 31.9  26.0 - 34.0 pg Final   MCHC 01/03/2023 33.1  30.0 - 36.0 g/dL Final   RDW 66/44/0347 13.8  11.5 - 15.5 % Final   Platelet Count 01/03/2023 214  150 - 400 K/uL Final   nRBC 01/03/2023 0.0  0.0 - 0.2 % Final   Neutrophils Relative % 01/03/2023 58  % Final   Neutro Abs 01/03/2023 3.5  1.7 - 7.7 K/uL Final   Lymphocytes Relative 01/03/2023 32  % Final   Lymphs Abs 01/03/2023 1.9  0.7 - 4.0 K/uL Final   Monocytes Relative 01/03/2023 8  % Final   Monocytes Absolute 01/03/2023 0.5  0.1 - 1.0 K/uL Final  Eosinophils Relative 01/03/2023 1  % Final   Eosinophils Absolute 01/03/2023 0.1  0.0 - 0.5 K/uL Final   Basophils Relative 01/03/2023 1  % Final   Basophils Absolute 01/03/2023 0.0  0.0 - 0.1 K/uL Final   Immature Granulocytes 01/03/2023 0  % Final   Abs Immature Granulocytes 01/03/2023 0.02  0.00 - 0.07 K/uL Final   Performed at [facility], [address] [place], [place] [address]  Admission on 12/08/2022, Discharged on 12/08/2022  Component Date Value Ref Range Status   Sodium 12/08/2022 137  135 - 145 mmol/L Final   Potassium 12/08/2022 4.4  3.5 - 5.1 mmol/L Final   Chloride 12/08/2022 104  98 - 111 mmol/L Final   CO2 12/08/2022 25  22 - 32 mmol/L Final   Glucose, Bld 12/08/2022 121 (H)  70 - 99 mg/dL Final   Glucose reference range applies only to samples taken after fasting for at least 8 hours.   BUN 12/08/2022 18  8 - 23 mg/dL Final   Creatinine, Ser 12/08/2022 1.24  0.61 - 1.24 mg/dL Final   Calcium 74/25/9563 8.8 (L)  8.9 - 10.3 mg/dL Final   GFR, Estimated 12/08/2022 59 (L)  >60 mL/min Final   Comment: (NOTE) Calculated using the  CKD-EPI Creatinine Equation (2021)    Anion gap 12/08/2022 8  5 - 15 Final   Performed at [facility], [address] [place], [place] [address]   WBC 12/08/2022 6.1  4.0 - 10.5 K/uL Final   RBC 12/08/2022 4.36  4.22 - 5.81 MIL/uL Final   Hemoglobin 12/08/2022 13.9  13.0 - 17.0 g/dL Final   HCT 33/29/5188 42.5  39.0 - 52.0 % Final   MCV 12/08/2022 97.5  80.0 - 100.0 fL Final   MCH 12/08/2022 31.9  26.0 - 34.0 pg Final   MCHC 12/08/2022 32.7  30.0 - 36.0 g/dL Final   RDW 41/66/0630 13.3  11.5 - 15.5 % Final   Platelets 12/08/2022 213  150 - 400 K/uL Final   nRBC 12/08/2022 0.0  0.0 - 0.2 % Final   Performed at [facility], [address] [place], [place] [address]   Troponin I (High Sensitivity) 12/08/2022 11  <18 ng/L Final   Comment: (NOTE) Elevated high sensitivity troponin I (hsTnI) values and significant  changes across serial measurements may suggest ACS but many other  chronic and acute conditions are known to elevate hsTnI results.  Refer to the "Links" section for chest pain algorithms and additional  guidance. Performed at [facility], [address] [place], [place] [address]    Troponin I (High Sensitivity) 12/08/2022 11  <18 ng/L Final   Comment: (NOTE) Elevated high sensitivity troponin I (hsTnI) values and significant  changes across serial measurements may suggest ACS but many other  chronic and acute conditions are known to elevate hsTnI results.  Refer to the "Links" section for chest pain algorithms and additional  guidance. Performed at [facility], [address] [place], [place] [address]    B Natriuretic Peptide 12/08/2022 112.6 (H)  0.0 - 100.0 pg/mL Final   Performed at [facility], [address] [place], [place] [address]   Lab Results  Component Value Date   IRON 55 01/03/2023   TIBC 382 01/03/2023   IRONPCTSAT 14 (L) 01/03/2023   Lab Results  Component Value Date   FERRITIN 9 (L)  01/03/2023    RADIOGRAPHIC STUDIES: I have personally reviewed the radiological images as  listed and agree with the findings in the report DG Chest 2 View  Result Date: 12/08/2022 CLINICAL DATA:  Chest pain and shortness of breath beginning this morning. EXAM: CHEST - 2 VIEW COMPARISON:  05/20/2021 FINDINGS: The heart size and mediastinal contours are within normal limits. Prior CABG again noted. Chronic interstitial lung disease is unchanged in appearance. No evidence of acute or superimposed pulmonary infiltrate or edema. The visualized skeletal structures are unremarkable. IMPRESSION: Stable chronic interstitial lung disease. No active disease. Electronically Signed   By: Danae Orleans M.D.   On: 12/08/2022 14:36

## 2023-01-03 NOTE — Assessment & Plan Note (Signed)
Multifactorial. Fatigue and shortness of breath level is out of proportion to his normal hemoglobin and mild iron deficiency. Likely secondary to his CAD and emphysema. Recommend patient to follow-up with cardiology and pulmonologist.

## 2023-01-03 NOTE — Progress Notes (Signed)
Pt here for follow up. Pt reports that he has been feeling dizzy, fatigued and short of breath.

## 2023-01-03 NOTE — Assessment & Plan Note (Addendum)
History of PE  Recommend patient to continue Eliquis 2.5mg  BID.   Eliquis prescription is not managed by me.

## 2023-01-04 ENCOUNTER — Telehealth: Payer: Self-pay

## 2023-01-04 NOTE — Telephone Encounter (Signed)
-----   Message from Rickard Patience sent at 01/03/2023  9:09 PM EST ----- Please let patient know that his iron panel showed decreased iron level.  I recommend IV Venofer weekly x 3.  Please arrange for him to follow-up with me in 4 months labs prior to MD plus Venofer.  Labs are ordered.  Thank you

## 2023-01-04 NOTE — Telephone Encounter (Signed)
pt informed of plan, please schedule:   Venofer weekly x3 4 months : labs prior to MD/Venfoer

## 2023-01-08 ENCOUNTER — Encounter: Payer: Self-pay | Admitting: Oncology

## 2023-01-10 ENCOUNTER — Inpatient Hospital Stay: Payer: Medicare HMO

## 2023-01-10 VITALS — BP 111/60 | HR 52 | Resp 18

## 2023-01-10 DIAGNOSIS — D509 Iron deficiency anemia, unspecified: Secondary | ICD-10-CM | POA: Diagnosis not present

## 2023-01-10 DIAGNOSIS — E611 Iron deficiency: Secondary | ICD-10-CM

## 2023-01-10 MED ORDER — IRON SUCROSE 20 MG/ML IV SOLN
200.0000 mg | Freq: Once | INTRAVENOUS | Status: AC
  Administered 2023-01-10: 200 mg via INTRAVENOUS
  Filled 2023-01-10: qty 10

## 2023-01-10 NOTE — Progress Notes (Signed)
Pt tolerated iron without incident. Pt gets dizzy when standing and walking. Pt refused wheelchair. This nurse walked with patient and wife until he got in the car. Pt encouraged to slow down and change positions slowly. Wife states this happens all the time.

## 2023-01-15 ENCOUNTER — Inpatient Hospital Stay: Payer: Medicare HMO

## 2023-01-15 VITALS — BP 117/60 | HR 49 | Temp 97.3°F | Resp 16

## 2023-01-15 DIAGNOSIS — D509 Iron deficiency anemia, unspecified: Secondary | ICD-10-CM | POA: Diagnosis not present

## 2023-01-15 DIAGNOSIS — E611 Iron deficiency: Secondary | ICD-10-CM

## 2023-01-15 MED ORDER — IRON SUCROSE 20 MG/ML IV SOLN
200.0000 mg | Freq: Once | INTRAVENOUS | Status: AC
Start: 2023-01-15 — End: 2023-01-15
  Administered 2023-01-15: 200 mg via INTRAVENOUS
  Filled 2023-01-15: qty 10

## 2023-01-15 NOTE — Patient Instructions (Signed)
Iron Sucrose Injection What is this medication? IRON SUCROSE (EYE ern SOO krose) treats low levels of iron (iron deficiency anemia) in people with kidney disease. Iron is a mineral that plays an important role in making red blood cells, which carry oxygen from your lungs to the rest of your body. This medicine may be used for other purposes; ask your health care provider or pharmacist if you have questions. COMMON BRAND NAME(S): Venofer What should I tell my care team before I take this medication? They need to know if you have any of these conditions: Anemia not caused by low iron levels Heart disease High levels of iron in the blood Kidney disease Liver disease An unusual or allergic reaction to iron, other medications, foods, dyes, or preservatives Pregnant or trying to get pregnant Breastfeeding How should I use this medication? This medication is for infusion into a vein. It is given in a hospital or clinic setting. Talk to your care team about the use of this medication in children. While this medication may be prescribed for children as young as 2 years for selected conditions, precautions do apply. Overdosage: If you think you have taken too much of this medicine contact a poison control center or emergency room at once. NOTE: This medicine is only for you. Do not share this medicine with others. What if I miss a dose? Keep appointments for follow-up doses. It is important not to miss your dose. Call your care team if you are unable to keep an appointment. What may interact with this medication? Do not take this medication with any of the following: Deferoxamine Dimercaprol Other iron products This medication may also interact with the following: Chloramphenicol Deferasirox This list may not describe all possible interactions. Give your health care provider a list of all the medicines, herbs, non-prescription drugs, or dietary supplements you use. Also tell them if you smoke,  drink alcohol, or use illegal drugs. Some items may interact with your medicine. What should I watch for while using this medication? Visit your care team regularly. Tell your care team if your symptoms do not start to get better or if they get worse. You may need blood work done while you are taking this medication. You may need to follow a special diet. Talk to your care team. Foods that contain iron include: whole grains/cereals, dried fruits, beans, or peas, leafy green vegetables, and organ meats (liver, kidney). What side effects may I notice from receiving this medication? Side effects that you should report to your care team as soon as possible: Allergic reactions--skin rash, itching, hives, swelling of the face, lips, tongue, or throat Low blood pressure--dizziness, feeling faint or lightheaded, blurry vision Shortness of breath Side effects that usually do not require medical attention (report to your care team if they continue or are bothersome): Flushing Headache Joint pain Muscle pain Nausea Pain, redness, or irritation at injection site This list may not describe all possible side effects. Call your doctor for medical advice about side effects. You may report side effects to FDA at 1-800-FDA-1088. Where should I keep my medication? This medication is given in a hospital or clinic. It will not be stored at home. NOTE: This sheet is a summary. It may not cover all possible information. If you have questions about this medicine, talk to your doctor, pharmacist, or health care provider.  2024 Elsevier/Gold Standard (2022-06-16 00:00:00)

## 2023-01-22 ENCOUNTER — Inpatient Hospital Stay: Payer: Medicare HMO

## 2023-01-22 VITALS — BP 105/58 | HR 54 | Temp 97.4°F | Resp 18

## 2023-01-22 DIAGNOSIS — D509 Iron deficiency anemia, unspecified: Secondary | ICD-10-CM | POA: Diagnosis not present

## 2023-01-22 DIAGNOSIS — E611 Iron deficiency: Secondary | ICD-10-CM

## 2023-01-22 MED ORDER — IRON SUCROSE 20 MG/ML IV SOLN
200.0000 mg | Freq: Once | INTRAVENOUS | Status: AC
  Administered 2023-01-22: 200 mg via INTRAVENOUS
  Filled 2023-01-22: qty 10

## 2023-01-22 MED ORDER — SODIUM CHLORIDE 0.9% FLUSH
10.0000 mL | INTRAVENOUS | Status: DC | PRN
Start: 2023-01-22 — End: 2023-01-22
  Administered 2023-01-22: 10 mL
  Filled 2023-01-22: qty 10

## 2023-01-22 NOTE — Patient Instructions (Signed)
Iron Sucrose Injection What is this medication? IRON SUCROSE (EYE ern SOO krose) treats low levels of iron (iron deficiency anemia) in people with kidney disease. Iron is a mineral that plays an important role in making red blood cells, which carry oxygen from your lungs to the rest of your body. This medicine may be used for other purposes; ask your health care provider or pharmacist if you have questions. COMMON BRAND NAME(S): Venofer What should I tell my care team before I take this medication? They need to know if you have any of these conditions: Anemia not caused by low iron levels Heart disease High levels of iron in the blood Kidney disease Liver disease An unusual or allergic reaction to iron, other medications, foods, dyes, or preservatives Pregnant or trying to get pregnant Breastfeeding How should I use this medication? This medication is for infusion into a vein. It is given in a hospital or clinic setting. Talk to your care team about the use of this medication in children. While this medication may be prescribed for children as young as 2 years for selected conditions, precautions do apply. Overdosage: If you think you have taken too much of this medicine contact a poison control center or emergency room at once. NOTE: This medicine is only for you. Do not share this medicine with others. What if I miss a dose? Keep appointments for follow-up doses. It is important not to miss your dose. Call your care team if you are unable to keep an appointment. What may interact with this medication? Do not take this medication with any of the following: Deferoxamine Dimercaprol Other iron products This medication may also interact with the following: Chloramphenicol Deferasirox This list may not describe all possible interactions. Give your health care provider a list of all the medicines, herbs, non-prescription drugs, or dietary supplements you use. Also tell them if you smoke,  drink alcohol, or use illegal drugs. Some items may interact with your medicine. What should I watch for while using this medication? Visit your care team regularly. Tell your care team if your symptoms do not start to get better or if they get worse. You may need blood work done while you are taking this medication. You may need to follow a special diet. Talk to your care team. Foods that contain iron include: whole grains/cereals, dried fruits, beans, or peas, leafy green vegetables, and organ meats (liver, kidney). What side effects may I notice from receiving this medication? Side effects that you should report to your care team as soon as possible: Allergic reactions--skin rash, itching, hives, swelling of the face, lips, tongue, or throat Low blood pressure--dizziness, feeling faint or lightheaded, blurry vision Shortness of breath Side effects that usually do not require medical attention (report to your care team if they continue or are bothersome): Flushing Headache Joint pain Muscle pain Nausea Pain, redness, or irritation at injection site This list may not describe all possible side effects. Call your doctor for medical advice about side effects. You may report side effects to FDA at 1-800-FDA-1088. Where should I keep my medication? This medication is given in a hospital or clinic. It will not be stored at home. NOTE: This sheet is a summary. It may not cover all possible information. If you have questions about this medicine, talk to your doctor, pharmacist, or health care provider.  2024 Elsevier/Gold Standard (2022-06-16 00:00:00)

## 2023-03-27 ENCOUNTER — Inpatient Hospital Stay
Admission: EM | Admit: 2023-03-27 | Discharge: 2023-04-05 | DRG: 312 | Disposition: A | Discharge status: Skilled Nursing Facility | Attending: Internal Medicine | Admitting: Internal Medicine

## 2023-03-27 ENCOUNTER — Observation Stay
Admit: 2023-03-27 | Discharge: 2023-03-27 | Disposition: A | Discharge status: Home / Self Care | Attending: Internal Medicine | Admitting: Internal Medicine

## 2023-03-27 ENCOUNTER — Observation Stay

## 2023-03-27 DIAGNOSIS — M48061 Spinal stenosis, lumbar region without neurogenic claudication: Secondary | ICD-10-CM | POA: Diagnosis present

## 2023-03-27 DIAGNOSIS — N179 Acute kidney failure, unspecified: Secondary | ICD-10-CM | POA: Insufficient documentation

## 2023-03-27 DIAGNOSIS — Z961 Presence of intraocular lens: Secondary | ICD-10-CM | POA: Diagnosis present

## 2023-03-27 DIAGNOSIS — Z7951 Long term (current) use of inhaled steroids: Secondary | ICD-10-CM

## 2023-03-27 DIAGNOSIS — R29818 Other symptoms and signs involving the nervous system: Secondary | ICD-10-CM | POA: Insufficient documentation

## 2023-03-27 DIAGNOSIS — Z87891 Personal history of nicotine dependence: Secondary | ICD-10-CM

## 2023-03-27 DIAGNOSIS — Z7952 Long term (current) use of systemic steroids: Secondary | ICD-10-CM

## 2023-03-27 DIAGNOSIS — M47812 Spondylosis without myelopathy or radiculopathy, cervical region: Secondary | ICD-10-CM | POA: Diagnosis present

## 2023-03-27 DIAGNOSIS — I25701 Atherosclerosis of coronary artery bypass graft(s), unspecified, with angina pectoris with documented spasm: Secondary | ICD-10-CM | POA: Diagnosis present

## 2023-03-27 DIAGNOSIS — I440 Atrioventricular block, first degree: Secondary | ICD-10-CM | POA: Diagnosis present

## 2023-03-27 DIAGNOSIS — H6120 Impacted cerumen, unspecified ear: Secondary | ICD-10-CM | POA: Insufficient documentation

## 2023-03-27 DIAGNOSIS — I209 Angina pectoris, unspecified: Secondary | ICD-10-CM | POA: Diagnosis present

## 2023-03-27 DIAGNOSIS — Z860101 Personal history of adenomatous and serrated colon polyps: Secondary | ICD-10-CM

## 2023-03-27 DIAGNOSIS — I252 Old myocardial infarction: Secondary | ICD-10-CM

## 2023-03-27 DIAGNOSIS — Z955 Presence of coronary angioplasty implant and graft: Secondary | ICD-10-CM

## 2023-03-27 DIAGNOSIS — I6523 Occlusion and stenosis of bilateral carotid arteries: Secondary | ICD-10-CM | POA: Diagnosis present

## 2023-03-27 DIAGNOSIS — F419 Anxiety disorder, unspecified: Secondary | ICD-10-CM | POA: Diagnosis present

## 2023-03-27 DIAGNOSIS — I10 Essential (primary) hypertension: Secondary | ICD-10-CM | POA: Diagnosis present

## 2023-03-27 DIAGNOSIS — I493 Ventricular premature depolarization: Secondary | ICD-10-CM | POA: Diagnosis present

## 2023-03-27 DIAGNOSIS — I110 Hypertensive heart disease with heart failure: Secondary | ICD-10-CM | POA: Diagnosis present

## 2023-03-27 DIAGNOSIS — I480 Paroxysmal atrial fibrillation: Secondary | ICD-10-CM | POA: Diagnosis present

## 2023-03-27 DIAGNOSIS — I951 Orthostatic hypotension: Principal | ICD-10-CM | POA: Diagnosis present

## 2023-03-27 DIAGNOSIS — Z951 Presence of aortocoronary bypass graft: Secondary | ICD-10-CM

## 2023-03-27 DIAGNOSIS — R079 Chest pain, unspecified: Secondary | ICD-10-CM | POA: Diagnosis present

## 2023-03-27 DIAGNOSIS — R008 Other abnormalities of heart beat: Secondary | ICD-10-CM | POA: Diagnosis present

## 2023-03-27 DIAGNOSIS — I255 Ischemic cardiomyopathy: Secondary | ICD-10-CM | POA: Diagnosis present

## 2023-03-27 DIAGNOSIS — Z8249 Family history of ischemic heart disease and other diseases of the circulatory system: Secondary | ICD-10-CM

## 2023-03-27 DIAGNOSIS — Z7901 Long term (current) use of anticoagulants: Secondary | ICD-10-CM

## 2023-03-27 DIAGNOSIS — Z86711 Personal history of pulmonary embolism: Secondary | ICD-10-CM

## 2023-03-27 DIAGNOSIS — G253 Myoclonus: Secondary | ICD-10-CM | POA: Diagnosis present

## 2023-03-27 DIAGNOSIS — I25110 Atherosclerotic heart disease of native coronary artery with unstable angina pectoris: Secondary | ICD-10-CM | POA: Diagnosis present

## 2023-03-27 DIAGNOSIS — Z79899 Other long term (current) drug therapy: Secondary | ICD-10-CM

## 2023-03-27 DIAGNOSIS — G8929 Other chronic pain: Secondary | ICD-10-CM | POA: Diagnosis present

## 2023-03-27 DIAGNOSIS — Z91041 Radiographic dye allergy status: Secondary | ICD-10-CM

## 2023-03-27 DIAGNOSIS — I5042 Chronic combined systolic (congestive) and diastolic (congestive) heart failure: Secondary | ICD-10-CM | POA: Diagnosis present

## 2023-03-27 DIAGNOSIS — I2510 Atherosclerotic heart disease of native coronary artery without angina pectoris: Secondary | ICD-10-CM | POA: Diagnosis present

## 2023-03-27 DIAGNOSIS — R54 Age-related physical debility: Secondary | ICD-10-CM | POA: Diagnosis present

## 2023-03-27 DIAGNOSIS — R001 Bradycardia, unspecified: Secondary | ICD-10-CM | POA: Diagnosis not present

## 2023-03-27 DIAGNOSIS — E785 Hyperlipidemia, unspecified: Secondary | ICD-10-CM | POA: Diagnosis present

## 2023-03-27 DIAGNOSIS — R519 Headache, unspecified: Secondary | ICD-10-CM | POA: Insufficient documentation

## 2023-03-27 DIAGNOSIS — D6859 Other primary thrombophilia: Secondary | ICD-10-CM | POA: Diagnosis present

## 2023-03-27 DIAGNOSIS — E611 Iron deficiency: Secondary | ICD-10-CM | POA: Diagnosis present

## 2023-03-27 DIAGNOSIS — E538 Deficiency of other specified B group vitamins: Secondary | ICD-10-CM | POA: Insufficient documentation

## 2023-03-27 DIAGNOSIS — Z888 Allergy status to other drugs, medicaments and biological substances status: Secondary | ICD-10-CM

## 2023-03-27 DIAGNOSIS — R55 Syncope and collapse: Secondary | ICD-10-CM | POA: Insufficient documentation

## 2023-03-27 DIAGNOSIS — Z9842 Cataract extraction status, left eye: Secondary | ICD-10-CM

## 2023-03-27 DIAGNOSIS — Z885 Allergy status to narcotic agent status: Secondary | ICD-10-CM

## 2023-03-27 DIAGNOSIS — R42 Dizziness and giddiness: Principal | ICD-10-CM

## 2023-03-27 LAB — TROPONIN I (HIGH SENSITIVITY)
Troponin I (High Sensitivity): 13 ng/L (ref <18)
Troponin I (High Sensitivity): 14 ng/L (ref <18)

## 2023-03-27 LAB — URINALYSIS, ROUTINE W REFLEX MICROSCOPIC
Bilirubin Urine: NEGATIVE
Glucose, UA: NEGATIVE mg/dL
Hgb urine dipstick: NEGATIVE
Ketones, ur: NEGATIVE mg/dL
Leukocytes,Ua: NEGATIVE
Nitrite: NEGATIVE
Protein, ur: NEGATIVE mg/dL
Specific Gravity, Urine: 1.016 (ref 1.005–1.030)
pH: 6 (ref 5.0–8.0)

## 2023-03-27 LAB — COMPREHENSIVE METABOLIC PANEL
ALT: 17 U/L (ref 0–44)
AST: 21 U/L (ref 15–41)
Albumin: 3.7 g/dL (ref 3.5–5.0)
Alkaline Phosphatase: 44 U/L (ref 38–126)
Anion gap: 10 (ref 5–15)
BUN: 30 mg/dL — ABNORMAL HIGH (ref 8–23)
CO2: 23 mmol/L (ref 22–32)
Calcium: 9.1 mg/dL (ref 8.9–10.3)
Chloride: 103 mmol/L (ref 98–111)
Creatinine, Ser: 1.42 mg/dL — ABNORMAL HIGH (ref 0.61–1.24)
GFR, Estimated: 50 mL/min — ABNORMAL LOW (ref >60)
Glucose, Bld: 128 mg/dL — ABNORMAL HIGH (ref 70–99)
Potassium: 5 mmol/L (ref 3.5–5.1)
Sodium: 136 mmol/L (ref 135–145)
Total Bilirubin: 0.9 mg/dL (ref 0.0–1.2)
Total Protein: 7.5 g/dL (ref 6.5–8.1)

## 2023-03-27 LAB — CBC
HCT: 43.2 % (ref 39.0–52.0)
Hemoglobin: 14.4 g/dL (ref 13.0–17.0)
MCH: 32.5 pg (ref 26.0–34.0)
MCHC: 33.3 g/dL (ref 30.0–36.0)
MCV: 97.5 fL (ref 80.0–100.0)
Platelets: 223 10*3/uL (ref 150–400)
RBC: 4.43 MIL/uL (ref 4.22–5.81)
RDW: 15.3 % (ref 11.5–15.5)
WBC: 10.9 10*3/uL — ABNORMAL HIGH (ref 4.0–10.5)
nRBC: 0 % (ref 0.0–0.2)

## 2023-03-27 MED ORDER — PREDNISONE 10 MG PO TABS
10.0000 mg | ORAL_TABLET | Freq: Every day | ORAL | Status: AC
  Administered 2023-03-28 – 2023-04-01 (×5): 10 mg via ORAL
  Filled 2023-03-27 (×5): qty 1

## 2023-03-27 MED ORDER — ONDANSETRON HCL 4 MG PO TABS: 4.0000 mg | ORAL_TABLET | Freq: Four times a day (QID) | ORAL | Status: AC | PRN

## 2023-03-27 MED ORDER — SODIUM CHLORIDE 0.9% FLUSH
3.0000 mL | Freq: Two times a day (BID) | INTRAVENOUS | Status: DC
  Administered 2023-03-27 – 2023-04-05 (×18): 3 mL via INTRAVENOUS

## 2023-03-27 MED ORDER — ONDANSETRON HCL 4 MG/2ML IJ SOLN: 4.0000 mg | Freq: Four times a day (QID) | INTRAMUSCULAR | Status: AC | PRN

## 2023-03-27 MED ORDER — NITROGLYCERIN 0.4 MG SL SUBL
0.4000 mg | SUBLINGUAL_TABLET | SUBLINGUAL | Status: DC | PRN
  Administered 2023-03-28 – 2023-04-03 (×12): 0.4 mg via SUBLINGUAL
  Filled 2023-03-27 (×9): qty 1

## 2023-03-27 MED ORDER — NITROGLYCERIN 2 % TD OINT: 0.5000 [in_us] | TOPICAL_OINTMENT | Freq: Every day | TRANSDERMAL | Status: DC | PRN

## 2023-03-27 MED ORDER — ACETAMINOPHEN 325 MG PO TABS
650.0000 mg | ORAL_TABLET | Freq: Four times a day (QID) | ORAL | Status: AC | PRN
  Administered 2023-03-27: 650 mg via ORAL
  Filled 2023-03-27: qty 2

## 2023-03-27 MED ORDER — ACETAMINOPHEN 650 MG RE SUPP: 650.0000 mg | Freq: Four times a day (QID) | RECTAL | Status: AC | PRN

## 2023-03-27 MED ORDER — SODIUM CHLORIDE 0.9 % IV BOLUS
1000.0000 mL | Freq: Once | INTRAVENOUS | Status: AC
  Administered 2023-03-27: 1000 mL via INTRAVENOUS

## 2023-03-27 MED ORDER — HEPARIN SODIUM (PORCINE) 5000 UNIT/ML IJ SOLN
5000.0000 [IU] | Freq: Three times a day (TID) | INTRAMUSCULAR | Status: DC
  Administered 2023-03-27 – 2023-03-28 (×2): 5000 [IU] via SUBCUTANEOUS
  Filled 2023-03-27 (×2): qty 1

## 2023-03-27 MED ORDER — SENNOSIDES-DOCUSATE SODIUM 8.6-50 MG PO TABS: 1.0000 | ORAL_TABLET | Freq: Every evening | ORAL | Status: DC | PRN

## 2023-03-27 NOTE — Assessment & Plan Note (Deleted)
 Home Imdur 60 mg daily, metoprolol succinate 25 mg daily will not be resumed on admission AM team to resume when the benefits outweigh the risks; medications may need readjustment pending cardiology evaluation

## 2023-03-27 NOTE — Assessment & Plan Note (Addendum)
 Continue Imdur and Ranexa.  Cardiology cleared for disposition home.  Started Voltaren gel.

## 2023-03-27 NOTE — H&P (Addendum)
 History and Physical   Scott [name] GNF:[id number] DOB: 1941-01-15 DOA: 03/27/2023  PCP: [name], MD  Outpatient Specialists: Dr. Gillermo Murdoch clinic cardiology; Dr. Bennie Hind clinic pulmonology Patient coming from: Home via POV  I have personally briefly reviewed patient's old medical records in [facility] EMR.  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  Vitals in the ED showed temperature of 98.4, respiration of 18, heart rate 57, blood pressure 136/71, SpO2 of 98% on room air.  Serum sodium is 136, potassium 5.0, chloride 103, bicarb 23, BUN of 30, serum creatinine 1.42, EGFR 50, nonfasting blood glucose 128, WBC 10.9, hemoglobin 14.4, platelets of 223.  High-sensitivity troponin is 13 and on repeat is 14.  UA was negative for leukocytes and nitrates manage  ED treatment: Sodium chloride 1 L bolus. --------------------------------- At bedside, patient able to tell me his first and last name, age, location, current calendar year.  He reports he has had prior episode of near syncope, dizziness before however they would normally resolve after a few minutes or seconds.  However today the symptoms continued.  In addition he endorses a new headache that he is never experienced before.  He reports he does not get headaches regularly.  He reports this is new for him.  He denies trauma to his person.  He denies chest pain, dysuria, hematuria, shortness of breath, blood in his stool, diarrhea, swelling of his lower extremities.  Social history: He denies tobacco, EtOH, recreational drug use.  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  ROS: Constitutional: no weight change, no fever ENT/Mouth: no sore throat, no rhinorrhea Eyes: no eye pain, no vision changes Cardiovascular: no chest pain, no dyspnea,  no edema, no palpitations Respiratory: no cough, no sputum, no wheezing Gastrointestinal: no nausea, no vomiting, no diarrhea, no constipation Genitourinary: no urinary incontinence, no dysuria, no hematuria Musculoskeletal: no arthralgias, no myalgias Skin: no skin lesions, no pruritus, Neuro: + weakness, no loss of consciousness, + near syncope, + dizziness, + headache Psych: no anxiety, no depression, no decrease appetite Heme/Lymph: no bruising, no bleeding  ED Course: Discussed with EDP, patient requiring hospitalization for chief concerns of symptomatic bradycardia, near syncope.  Assessment/Plan  Principal Problem:   Symptomatic sinus bradycardia Active Problems:   Hyperlipidemia   History of smoking   Cardiac angina (HCC)   S/P drug eluting coronary stent placement   Essential hypertension   PAF (paroxysmal atrial fibrillation) (HCC)   Chronic anticoagulation   Coronary artery disease involving coronary bypass graft of native heart with angina pectoris with documented spasm (HCC)   Anxiety   Hyperlipemia   Ischemic cardiomyopathy   Chronic Chest Pain   Hx of CABG   Iron deficiency   Protein S deficiency (HCC)   CAD (coronary artery disease)   Near syncope   Headache with neurologic deficit   Assessment and Plan:  * Symptomatic sinus bradycardia Holding home metoprolol, Imdur, Ranexa on admission Staff message timed for 03/28/2023 at 6 AM to Dr. Juliann Pares and Epic IP cardiology consult order placed Complete echo ordered Admit to telemetry cardiac, observation  Headache with neurologic deficit He reports he had difficulty gathering his thoughts, gathering his words today Reports the symptoms are  new for him and he has never experienced this before Reports the new headache is still  persistent CT head wo contrast ordered  PAF (paroxysmal atrial fibrillation) (HCC) Home Eliquis held on admission pending cardiology evaluation for possible cardiac intervention/procedure Heparin 5000 units subcutaneous every 8 hours AM team to resume home Eliquis when the benefits outweigh the risk  Essential hypertension Home Imdur 60 mg daily, metoprolol succinate 25 mg daily will not be resumed on admission AM team to resume when the benefits outweigh the risks; medications may need readjustment pending cardiology evaluation  Cardiac angina (HCC) Home Ranexa not resumed on admission due to possible side effects of dizziness and bradycardia Nitroglycerin sublingual every 5 minutes as needed for chest pain ordered  Chart reviewed.   DVT prophylaxis: Heparin 5000 units subcutaneous every 8 hours Code Status: full code Diet: Heart healthy diet Family Communication: no  Disposition Plan: Pending clinical course Consults called: Cardiology Admission status: Telemetry cardiac, observation  Past Medical History:  Diagnosis Date   Arthritis    right hip, since a fall   CAD (coronary artery disease)    a. 04/2012 Cath: 3VD->Med Rx;  b. 04/2013 PCI RCA (2 DES); c. 03/2014 PCI: LAD 80 (3.0x23 Xience Alpine DES); d. 06/2014 CABG x 2 ([facility]) LIMA->LAD, VG->OM; e. 12/2014 Cath ([facility]): patent grafts->Med Rx; f. 08/2015 Cath: patent grafts; g. 11/2016 Cath: LM min irregs, LAD 20p, 56m, LCX 100p/m, RCA 20ost, 10p/m/d, VG->OM1 nl, LIMA->dLAD nl; f. 07/2017 MV: No isch, EF 51%.   Cardiomyopathy, ischemic    a. 04/2012 Echo: EF 40-45%;  b. 08/2013 Echo: EF 45-50%; c. 01/2015 Echo: EF 40-45%; d. 07/2016 Echo: EF 60-65%; e. 09/2017 Echo: EF 55-60%, Gr1 DD.   Carotid arterial disease (HCC)    a. 05/2013 Carotid U/S; bilat 40-50% ICA stenosis.   Chronic Chest Pain    USES NITRO   Chronic systolic CHF (congestive heart failure) (HCC)    a. 01/2015 Echo: EF 40-45%; b. 07/2016 Echo: EF 60-65%; c. 09/2017 Echo: EF  55-60%, Gr1 DD, nl RV fxn.   Cough    Diverticulosis    Dizziness    Dyspnea    WITH EXERTION   Headache 1970's   migraine   History of blood transfusion 2016   post op   Hyperlipidemia    Hypertension    Iron deficiency anemia due to chronic blood loss 09/11/2016   Myocardial infarction [facility])    unsure of when   Pulmonary emboli (HCC) 07/2016   a. On Xarelto.   Reflux esophagitis    Sternal pain    a. 03/2016 s/p Sternal wire removal; b. 05/2016 s/p redo median sternotomy for sternal debridement and sternal plating.   Syncope 04/2018   CARDIOLOGIST WAS DR Mariah Milling AND HE WAS AWARE-NOW PT SEES PARASCHOS   Tubular adenoma of colon    Past Surgical History:  Procedure Laterality Date   CARDIAC CATHETERIZATION  05/01/2012   CARDIAC CATHETERIZATION  04/2013   [facility];x3 stent   CARDIAC CATHETERIZATION  01/11/15    [facility]   CARDIAC CATHETERIZATION N/A 09/16/2015   Procedure: LEFT HEART CATH AND CORS/GRAFTS ANGIOGRAPHY;  Surgeon: Antonieta Iba, MD;  Location: [facility] INVASIVE CV LAB;  Service: Cardiovascular;  Laterality: N/A;   CARPAL TUNNEL RELEASE     right hand   CATARACT EXTRACTION     LEFT   CATARACT EXTRACTION W/PHACO Left 09/16/2014   Procedure: CATARACT EXTRACTION PHACO AND INTRAOCULAR LENS PLACEMENT (IOC);  Surgeon: Lockie Mola, MD;  Location: [facility]  SURGERY CNTR;  Service: Ophthalmology;  Laterality: Left;   COLONOSCOPY     COLONOSCOPY WITH PROPOFOL N/A 01/28/2016   Procedure: COLONOSCOPY WITH PROPOFOL;  Surgeon: Christena Deem, MD;  Location: [facility] ENDOSCOPY;  Service: Endoscopy;  Laterality: N/A;   COLONOSCOPY WITH PROPOFOL N/A 08/23/2021   Procedure: COLONOSCOPY WITH PROPOFOL;  Surgeon: Toney Reil, MD;  Location: [facility] ENDOSCOPY;  Service: Gastroenterology;  Laterality: N/A;  REQUESTS 9AM OR LATER   CORONARY ANGIOPLASTY WITH STENT PLACEMENT  04/13/2014   CORONARY ARTERY BYPASS GRAFT  06-26-14   x3 bypasses   CORONARY PRESSURE/FFR WITH 3D MAPPING N/A 12/10/2018    Procedure: Mellody Dance PRESSURE WIRE/FFR STUDY;  Surgeon: Marcina Millard, MD;  Location: [facility] INVASIVE CV LAB;  Service: Cardiovascular;  Laterality: N/A;   CORONARY ULTRASOUND/IVUS N/A 12/10/2018   Procedure: Intravascular Ultrasound/IVUS;  Surgeon: Marcina Millard, MD;  Location: [facility] INVASIVE CV LAB;  Service: Cardiovascular;  Laterality: N/A;   CYSTOSCOPY N/A 08/14/2018   Procedure: CYSTOSCOPY;  Surgeon: Riki Altes, MD;  Location: [facility] ORS;  Service: Urology;  Laterality: N/A;   DE QUERVAIN'S RELEASE Left 08/22/2012   ESOPHAGOGASTRODUODENOSCOPY (EGD) WITH PROPOFOL N/A 04/26/2015   Procedure: ESOPHAGOGASTRODUODENOSCOPY (EGD) WITH PROPOFOL;  Surgeon: Wallace Cullens, MD;  Location: [facility] ENDOSCOPY;  Service: Gastroenterology;  Laterality: N/A;   ESOPHAGOGASTRODUODENOSCOPY (EGD) WITH PROPOFOL N/A 05/29/2017   Procedure: ESOPHAGOGASTRODUODENOSCOPY (EGD) WITH PROPOFOL;  Surgeon: Christena Deem, MD;  Location: [facility] ENDOSCOPY;  Service: Endoscopy;  Laterality: N/A;   LEFT HEART CATH AND CORONARY ANGIOGRAPHY N/A 12/04/2016   Procedure: LEFT HEART CATH AND CORS/GRAFTS  ANGIOGRAPHY;  Surgeon: Iran Ouch, MD;  Location: [facility] INVASIVE CV LAB;  Service: Cardiovascular;  Laterality: N/A;   LEFT HEART CATH AND CORS/GRAFTS ANGIOGRAPHY N/A 04/09/2018   Procedure: LEFT HEART CATH AND CORS/GRAFTS ANGIOGRAPHY;  Surgeon: Marcina Millard, MD;  Location: [facility] INVASIVE CV LAB;  Service: Cardiovascular;  Laterality: N/A;   LEFT HEART CATH AND CORS/GRAFTS ANGIOGRAPHY N/A 12/10/2018   Procedure: LEFT HEART CATH AND CORS/GRAFTS ANGIOGRAPHY;  Surgeon: Marcina Millard, MD;  Location: [facility] INVASIVE CV LAB;  Service: Cardiovascular;  Laterality: N/A;   LEFT HEART CATH AND CORS/GRAFTS ANGIOGRAPHY Left 05/25/2020   Procedure: LEFT HEART CATH AND CORS/GRAFTS ANGIOGRAPHY;  Surgeon: Marcina Millard, MD;  Location: [facility] INVASIVE CV LAB;  Service: Cardiovascular;  Laterality: Left;   RIB PLATING N/A  05/25/2016   Procedure: STERNAL PLATING;  Surgeon: Loreli Slot, MD;  Location: [facility] OR;  Service: Thoracic;  Laterality: N/A;   right shoulder     STERNAL WIRES REMOVAL N/A 03/30/2016   Procedure: STERNAL WIRES REMOVAL;  Surgeon: Loreli Slot, MD;  Location: [facility] OR;  Service: Thoracic;  Laterality: N/A;   TONSILLECTOMY     Social History:  reports that he quit smoking about 16 years ago. His smoking use included cigarettes. He started smoking about 51 years ago. He has a 35 pack-year smoking history. He has quit using smokeless tobacco. He reports that he does not drink alcohol and does not use drugs.  Allergies  Allergen Reactions   Contrast Media [Iodinated Contrast Media] Hives   Metrizamide Hives   Oxycodone Hcl Itching   Vicodin [Hydrocodone-Acetaminophen] Itching   Family History  Problem Relation Age of Onset   Heart attack Father 31       MI   Cancer Maternal Uncle    Family history: Family history reviewed and not pertinent.  Prior to Admission medications   Medication Sig Start Date End Date Taking? Authorizing  Provider  azithromycin (ZITHROMAX) 250 MG tablet Take 250 mg by mouth daily. 03/23/23 03/28/23 Yes [provider]  predniSONE (DELTASONE) 10 MG tablet Take 10 mg by mouth. 03/23/23 04/02/23 Yes [provider]  predniSONE (DELTASONE) 2.5 MG tablet Take 2.5 mg by mouth. 02/16/23  Yes [provider]  sulfamethoxazole-trimethoprim (BACTRIM) 400-80 MG tablet Take 1 tablet by mouth. 02/19/23 03/30/23 Yes [provider]  atorvastatin (LIPITOR) 80 MG tablet Take by mouth. 10/20/20   [provider]  ELIQUIS 2.5 MG TABS tablet Take 2.5 mg by mouth 2 (two) times daily. 03/19/19   [provider]  ezetimibe (ZETIA) 10 MG tablet Take 1 tablet (10 mg total) by mouth daily. Patient taking differently: Take 10 mg by mouth at bedtime. 12/07/17   Antonieta Iba, MD  isosorbide mononitrate (IMDUR) 60 MG 24 hr tablet Take 60  mg by mouth 2 (two) times daily.     [provider]  metoprolol succinate (TOPROL-XL) 25 MG 24 hr tablet Take 25 mg by mouth every evening. 03/18/19   [provider]  nitroGLYCERIN (NITRO-BID) 2 % ointment Apply 0.5 inches topically daily as needed. Patient not taking: Reported on 01/03/2023 10/17/21   [provider]  ranolazine (RANEXA) 1000 MG SR tablet Take 1 tablet (1,000 mg total) by mouth 2 (two) times daily. 12/07/17   Antonieta Iba, MD   Physical Exam: Vitals:   03/27/23 2018 03/27/23 2142 03/27/23 2145 03/27/23 2148  BP: 136/71 (!) 144/77 (!) 142/67 (!) 156/73  Pulse: (!) 57     Resp: 18 18 18 18   Temp: 98.4 F (36.9 C) 97.9 F (36.6 C)    TempSrc: Oral Oral    SpO2: 98% 100%    Weight:  79.9 kg    Height:  5\' 10"  (1.778 m)     Constitutional: appears age-appropriate, frail, NAD , calm Eyes: PERRL, lids and conjunctivae normal ENMT: Mucous membranes are moist. Posterior pharynx clear of any exudate or lesions. Age-appropriate dentition. Hearing appropriate Neck: normal, supple, no masses, no thyromegaly Respiratory: clear to auscultation bilaterally, no wheezing, no crackles. Normal respiratory effort. No accessory muscle use.  Cardiovascular: Regular rate and rhythm, no murmurs / rubs / gallops. No extremity edema. 2+ pedal pulses. No carotid bruits.  Abdomen: no tenderness, no masses palpated, no hepatosplenomegaly. Bowel sounds positive.  Musculoskeletal: no clubbing / cyanosis. No joint deformity upper and lower extremities. Good ROM, no contractures, no atrophy. Normal muscle tone.  Skin: no rashes, lesions, ulcers. No induration Neurologic: Sensation intact. Strength 5/5 in all 4.  Psychiatric: Normal judgment and insight. Alert and oriented x 3. Normal mood.   EKG: independently reviewed, showing sinus bradycardia with first-degree AV block, rate of 50, QTc 468  Chest x-ray on Admission: I personally reviewed and I agree with  radiologist reading as below.  Portable Chest 1 View Result Date: 03/27/2023 CLINICAL DATA:  Dizziness with chest pain, initial encounter EXAM: PORTABLE CHEST 1 VIEW COMPARISON:  12/08/2022 FINDINGS: Postsurgical changes are again noted. Cardiac shadow is mildly prominent but accentuated by the portable technique. Lungs are well aerated bilaterally. No focal infiltrate or effusion is seen. No bony abnormality is noted. Mild interstitial changes are again seen. IMPRESSION: No active disease. Electronically Signed   By: Alcide Clever M.D.   On: 03/27/2023 22:26   Labs on Admission: I have personally reviewed following labs  CBC: Recent Labs  Lab 03/27/23 1412  WBC 10.9*  HGB 14.4  HCT 43.2  MCV  97.5  PLT 223   Basic Metabolic Panel: Recent Labs  Lab 03/27/23 1412  NA 136  K 5.0  CL 103  CO2 23  GLUCOSE 128*  BUN 30*  CREATININE 1.42*  CALCIUM 9.1   GFR: Estimated Creatinine Clearance: 42.8 mL/min (A) (by C-G formula based on SCr of 1.42 mg/dL (H)).  Liver Function Tests: Recent Labs  Lab 03/27/23 1412  AST 21  ALT 17  ALKPHOS 44  BILITOT 0.9  PROT 7.5  ALBUMIN 3.7   Urine analysis:    Component Value Date/Time   COLORURINE YELLOW (A) 03/27/2023 1454   APPEARANCEUR CLEAR (A) 03/27/2023 1454   APPEARANCEUR Hazy (A) 09/24/2018 1108   LABSPEC 1.016 03/27/2023 1454   PHURINE 6.0 03/27/2023 1454   GLUCOSEU NEGATIVE 03/27/2023 1454   HGBUR NEGATIVE 03/27/2023 1454   BILIRUBINUR NEGATIVE 03/27/2023 1454   BILIRUBINUR Negative 09/24/2018 1108   KETONESUR NEGATIVE 03/27/2023 1454   PROTEINUR NEGATIVE 03/27/2023 1454   NITRITE NEGATIVE 03/27/2023 1454   LEUKOCYTESUR NEGATIVE 03/27/2023 1454   This document was prepared using Dragon Voice Recognition software and may include unintentional dictation errors.  Dr. Sedalia Muta [facility]  If 7PM-7AM, please contact overnight-coverage provider If 7AM-7PM, please contact day attending provider [email or web address]  03/27/2023,  11:16 PM

## 2023-03-27 NOTE — ED Notes (Signed)
 Spoke with Derrill Kay, MD regarding patient. Orders placed accordingly.

## 2023-03-27 NOTE — ED Notes (Signed)
 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 Pt states that they have been eating and drinking per their usual and nothing seems to be out of the ordinary. Pt is A&Ox4.

## 2023-03-27 NOTE — Assessment & Plan Note (Addendum)
 Repeat MRI of the brain with contrast with AIC.

## 2023-03-27 NOTE — Assessment & Plan Note (Addendum)
 Patient has a history of bradycardia I do not think this is causing the patient's symptoms.

## 2023-03-27 NOTE — ED Triage Notes (Signed)
 Pt presents to the ED POV from home with wife for dizziness. 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 Pt reports that the dizziness is still present and it feels like he is going to pass out.

## 2023-03-27 NOTE — ED Notes (Signed)
 While obtaining orthostatic vitals signs Scott [name] got unsteady on his feet twice while standing.

## 2023-03-27 NOTE — ED Provider Notes (Signed)
 [facility] Provider Note   Event Date/Time   First MD Initiated Contact with Patient 03/27/23 1511     (approximate) History  Dizziness  HPI Scott [name] is a 83 y.o. male with a past medical history of paroxysmal atrial fibrillation, coronary artery disease, hypertension, hyperlipidemia, and cardiomyopathy who presents complaining of an episode of dizziness and hypertension that occurred prior to arrival.  Patient states that he was doing work in his garage when he had sudden onset of lightheadedness that required him to sit down and eventually get to the floor before getting back into his house.  Patient then called EMS had to help him to a stretcher as he could not stand up without becoming extremely lightheaded.  Patient states that he is still feeling lightheaded upon getting up from a seated position however does not feel lightheaded sitting in the stretcher.  Patient states that he has had symptoms similar to this in the past that caused him to lose consciousness however he did not lose consciousness today ROS: Patient currently denies any vision changes, tinnitus, difficulty speaking, facial droop, sore throat, chest pain, shortness of breath, abdominal pain, nausea/vomiting/diarrhea, dysuria, or numbness/paresthesias in any extremity   Physical Exam  Triage Vital Signs: ED Triage Vitals  Encounter Vitals Group     BP 03/27/23 1406 127/63     Systolic BP Percentile --      Diastolic BP Percentile --      Pulse Rate 03/27/23 1406 (!) 49     Resp 03/27/23 1406 18     Temp 03/27/23 1406 98.5 F (36.9 C)     Temp Source 03/27/23 1406 Oral     SpO2 03/27/23 1406 99 %     Weight 03/27/23 1407 180 lb (81.6 kg)     Height 03/27/23 1407 5\' 10"  (1.778 m)     Head Circumference --      Peak Flow --      Pain Score 03/27/23 1407 0     Pain Loc --      Pain Education --      Exclude from Growth Chart --    Most recent vital signs: Vitals:   03/27/23 2145  03/27/23 2148  BP: (!) 142/67 (!) 156/73  Pulse:    Resp: 14 16  Temp:    SpO2:     General: Awake, oriented x4. CV:  Good peripheral perfusion.  Resp:  Normal effort.  Abd:  No distention.  Other:  Elderly well-developed, well-nourished Caucasian male resting comfortably in no acute distress ED Results / Procedures / Treatments  Labs (all labs ordered are listed, but only abnormal results are displayed) Labs Reviewed  CBC - Abnormal; Notable for the following components:      Result Value   WBC 10.9 (*)    All other components within normal limits  URINALYSIS, ROUTINE W REFLEX MICROSCOPIC - Abnormal; Notable for the following components:   Color, Urine YELLOW (*)    APPearance CLEAR (*)    All other components within normal limits  COMPREHENSIVE METABOLIC PANEL - Abnormal; Notable for the following components:   Glucose, Bld 128 (*)    BUN 30 (*)    Creatinine, Ser 1.42 (*)    GFR, Estimated 50 (*)    All other components within normal limits  BASIC METABOLIC PANEL  CBC  TROPONIN I (HIGH SENSITIVITY)  TROPONIN I (HIGH SENSITIVITY)   EKG ED ECG REPORT I, Merwyn Katos, the attending physician,  personally viewed and interpreted this ECG. Date: 03/27/2023 EKG Time: 1404 Rate: 50 Rhythm: Bradycardic sinus rhythm QRS Axis: normal Intervals: First-degree AV block ST/T Wave abnormalities: normal Narrative Interpretation: Bradycardic sinus rhythm with first-degree AV block.  No evidence of acute ischemia RADIOLOGY ED MD interpretation: One-view portable chest x-ray interpreted by me shows no evidence of acute abnormalities including no pneumonia, pneumothorax, or widened mediastinum -Agree with radiology assessment Official radiology report(s): Portable Chest 1 View Result Date: 03/27/2023 CLINICAL DATA:  Dizziness with chest pain, initial encounter EXAM: PORTABLE CHEST 1 VIEW COMPARISON:  12/08/2022 FINDINGS: Postsurgical changes are again noted. Cardiac shadow is mildly  prominent but accentuated by the portable technique. Lungs are well aerated bilaterally. No focal infiltrate or effusion is seen. No bony abnormality is noted. Mild interstitial changes are again seen. IMPRESSION: No active disease. Electronically Signed   By: Alcide Clever M.D.   On: 03/27/2023 22:26   PROCEDURES: Critical Care performed: No .1-3 Lead EKG Interpretation  Performed by: Merwyn Katos, MD Authorized by: Merwyn Katos, MD     Interpretation: abnormal     ECG rate:  51   ECG rate assessment: bradycardic     Rhythm: sinus bradycardia     Ectopy: none     Conduction: normal    MEDICATIONS ORDERED IN ED: Medications  sodium chloride flush (NS) 0.9 % injection 3 mL (3 mLs Intravenous Given 03/27/23 2202)  heparin injection 5,000 Units (5,000 Units Subcutaneous Given 03/27/23 2202)  senna-docusate (Senokot-S) tablet 1 tablet (has no administration in time range)  acetaminophen (TYLENOL) tablet 650 mg (has no administration in time range)    Or  acetaminophen (TYLENOL) suppository 650 mg (has no administration in time range)  ondansetron (ZOFRAN) tablet 4 mg (has no administration in time range)    Or  ondansetron (ZOFRAN) injection 4 mg (has no administration in time range)  predniSONE (DELTASONE) tablet 10 mg (has no administration in time range)  nitroGLYCERIN (NITROSTAT) SL tablet 0.4 mg (has no administration in time range)  sodium chloride 0.9 % bolus 1,000 mL (0 mLs Intravenous Stopped 03/27/23 1957)   IMPRESSION / MDM / ASSESSMENT AND PLAN / ED COURSE  I reviewed the triage vital signs and the nursing notes.                             The patient is on the cardiac monitor to evaluate for evidence of arrhythmia and/or significant heart rate changes. Patient's presentation is most consistent with acute presentation with potential threat to life or bodily function. The patient is suffering from bradycardia without concerning signs of instability on exam such as altered  mental status, hypotension, evidence of cardiac end organ dysfunction, or acute heart failure.  Ddx: sick sinus syndrome, vasovagal, unstable heart block (ekg with no signs of Mobitz II, complete heart block), right coronary artery myocardial infarction (neg trop, non STEMI, no chest pain), infection (afebrile, no leukocytosis, no recent illness), hypothyroidism, hyperkalemia, hypoglycemia, dehydration, or intoxication (beta blockade, calcium channel blockade, clonidine, digoxin, opiates, alcohol or other). Workup: CBC, CMP, Trop, EKG, telemetry Results: CBC WBC 10.9 CMP creatinine 1.42 Trop Neg x1 EKG interpreted by me and shows sinus bradycardia  Patient had persistent orthostatic lightheadedness despite fluid resuscitation.  I spoke to Dr. Juliann Pares in cardiology who recommends admission and evaluation Spoke to Dr. Sedalia Muta and hospital medicine who has graciously agreed to accept this patient onto their service for further evaluation and  management Dispo: Admit to medicine   FINAL CLINICAL IMPRESSION(S) / ED DIAGNOSES   Final diagnoses:  Orthostatic lightheadedness  Bradycardia  Current use of beta blocker   Rx / DC Orders   ED Discharge Orders     None      Note:  This document was prepared using Dragon voice recognition software and may include unintentional dictation errors.   Merwyn Katos, MD 03/27/23 2236

## 2023-03-27 NOTE — Assessment & Plan Note (Addendum)
 Continue Eliquis.

## 2023-03-27 NOTE — Hospital Course (Addendum)
 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  3/12.  Patient standing up and getting dizzy almost fell back into the bed.  Finger-nose slightly impaired.  Will get a repeat MRI of the brain with contrast with AIC.  Will increase midodrine 10 mg 3 times a day.  Will give another vitamin B12 injection today and tomorrow. 3/13.  Patient feeling better today.  Continue increased dose of midodrine.  Will give another B12 injection today.  Oral B12 starting tomorrow.

## 2023-03-27 NOTE — Plan of Care (Signed)
  Problem: Education: Goal: Knowledge of condition and prescribed therapy will improve Outcome: Progressing   Problem: Clinical Measurements: Goal: Respiratory complications will improve Outcome: Progressing   Problem: Clinical Measurements: Goal: Cardiovascular complication will be avoided Outcome: Progressing   Problem: Elimination: Goal: Will not experience complications related to urinary retention Outcome: Progressing   Problem: Pain Managment: Goal: General experience of comfort will improve and/or be controlled Outcome: Progressing   Problem: Safety: Goal: Ability to remain free from injury will improve Outcome: Progressing

## 2023-03-27 NOTE — ED Notes (Signed)
 Pt used urinal w/ stand-by assist. Pt back in bed. Requesting something to eat. Requested sandwich tray via clerk since ED stock empty. Pt denies other needs. Wife at bedside.

## 2023-03-27 NOTE — Assessment & Plan Note (Deleted)
 Home Eliquis held on admission pending cardiology evaluation for possible cardiac intervention/procedure Heparin 5000 units subcutaneous every 8 hours,

## 2023-03-27 NOTE — ED Notes (Signed)
 Pt stood at the bedside to use the urinal and had 2 dizzy spells while this RN and RN Delaney Meigs were standing at their side. Pt sat down suddenly when both spells happened. Pt was placed back in the bed and the bed is in the lowest, locked position with the call bell in reach.

## 2023-03-28 ENCOUNTER — Observation Stay

## 2023-03-28 DIAGNOSIS — R001 Bradycardia, unspecified: Secondary | ICD-10-CM | POA: Diagnosis not present

## 2023-03-28 LAB — ECHOCARDIOGRAM COMPLETE
Area-P 1/2: 3.78 cm2
Height: 70 in
S' Lateral: 4.8 cm
Weight: 2818.36 [oz_av]

## 2023-03-28 LAB — BASIC METABOLIC PANEL
Anion gap: 6 (ref 5–15)
BUN: 29 mg/dL — ABNORMAL HIGH (ref 8–23)
CO2: 23 mmol/L (ref 22–32)
Calcium: 8.6 mg/dL — ABNORMAL LOW (ref 8.9–10.3)
Chloride: 109 mmol/L (ref 98–111)
Creatinine, Ser: 1.26 mg/dL — ABNORMAL HIGH (ref 0.61–1.24)
GFR, Estimated: 58 mL/min — ABNORMAL LOW (ref >60)
Glucose, Bld: 91 mg/dL (ref 70–99)
Potassium: 4.3 mmol/L (ref 3.5–5.1)
Sodium: 138 mmol/L (ref 135–145)

## 2023-03-28 LAB — GLUCOSE, CAPILLARY: Glucose-Capillary: 106 mg/dL — ABNORMAL HIGH (ref 70–99)

## 2023-03-28 LAB — TROPONIN I (HIGH SENSITIVITY)
Troponin I (High Sensitivity): 16 ng/L (ref <18)
Troponin I (High Sensitivity): 16 ng/L (ref <18)

## 2023-03-28 LAB — CBC
HCT: 38.4 % — ABNORMAL LOW (ref 39.0–52.0)
Hemoglobin: 13.3 g/dL (ref 13.0–17.0)
MCH: 32.4 pg (ref 26.0–34.0)
MCHC: 34.6 g/dL (ref 30.0–36.0)
MCV: 93.7 fL (ref 80.0–100.0)
Platelets: 199 10*3/uL (ref 150–400)
RBC: 4.1 MIL/uL — ABNORMAL LOW (ref 4.22–5.81)
RDW: 15.4 % (ref 11.5–15.5)
WBC: 8.7 10*3/uL (ref 4.0–10.5)
nRBC: 0 % (ref 0.0–0.2)

## 2023-03-28 MED ORDER — ATORVASTATIN CALCIUM 80 MG PO TABS
80.0000 mg | ORAL_TABLET | Freq: Every day | ORAL | Status: DC
  Administered 2023-03-28 – 2023-03-30 (×3): 80 mg via ORAL
  Filled 2023-03-28 (×3): qty 1

## 2023-03-28 MED ORDER — APIXABAN 2.5 MG PO TABS
2.5000 mg | ORAL_TABLET | Freq: Two times a day (BID) | ORAL | Status: DC
  Administered 2023-03-28 – 2023-04-05 (×17): 2.5 mg via ORAL
  Filled 2023-03-28 (×17): qty 1

## 2023-03-28 MED ORDER — TRAZODONE HCL 50 MG PO TABS
50.0000 mg | ORAL_TABLET | Freq: Every evening | ORAL | Status: DC | PRN
  Administered 2023-03-28 – 2023-04-04 (×8): 50 mg via ORAL
  Filled 2023-03-28 (×8): qty 1

## 2023-03-28 MED ORDER — EZETIMIBE 10 MG PO TABS
10.0000 mg | ORAL_TABLET | Freq: Every day | ORAL | Status: DC
  Administered 2023-03-28 – 2023-03-29 (×2): 10 mg via ORAL
  Filled 2023-03-28 (×2): qty 1

## 2023-03-28 NOTE — Consult Note (Signed)
 [facility] CLINIC CARDIOLOGY CONSULT NOTE       Patient ID: Scott [name] MRN: [id number] DOB/AGE: [birth year] 83 y.o.  Admit date: 03/27/2023 Referring Physician Dr. Londell Moh Primary Physician [name], MD  Primary Cardiologist Dr. Darrold Junker Reason for Consultation dizziness, presyncope  HPI: Scott [name] is a 83 y.o. male  with a past medical history of coronary artery disease s/p CABG, hypertension, hyperlipidemia, ischemic cardiomyopathy, paroxysmal atrial fibrillation, history of pulmonary embolism, sinus bradycardia who presented to the ED on 03/27/2023 for dizziness and presyncope. Cardiology was consulted for further evaluation.   Is that yesterday he was working in his yard and in his workshop.  States that he was walking around and then suddenly had onset of dizziness.  Reports a history of dizziness with episodes typically only last a few seconds, this episode lasted much longer.  Tried to walk into his house and felt "drunk", when trying to sit on the couch he states that he just fell over but did not lose consciousness.  Decided to come to the ED for further evaluation.  Workup in the ED notable for creatinine 1.42, potassium 5.0, hemoglobin 14.4, WBC 10.9.  Troponins normal x 2 at 13, 14.  Chest x-ray without acute abnormality.  Head was also without any acute abnormality.  EKG revealed sinus bradycardia with a rate of 50, he was noted to be bradycardic on telemetry in the ED.  At the time of my evaluation this morning patient is resting comfortably in hospital bed with wife present at bedside.  We discussed his symptoms in further detail.  He states he has a long history of dizzy spells that only usually last for few seconds.  He states he has had 1 syncopal episode but this was many months ago.  Yesterday his dizziness episode lasted much longer, had not episode of near syncope but did not actually lose consciousness.  He reports noticing some mild chest pain when he first had the  onset of dizziness but this resolved relatively quickly.  Also reports shortness of breath that has been mildly worsening over the last few months.  Denies any significant functional limitations and is able to do all activities he wishes.  Review of systems complete and found to be negative unless listed above    Past Medical History:  Diagnosis Date   Arthritis    right hip, since a fall   CAD (coronary artery disease)    a. 04/2012 Cath: 3VD->Med Rx;  b. 04/2013 PCI RCA (2 DES); c. 03/2014 PCI: LAD 80 (3.0x23 Xience Alpine DES); d. 06/2014 CABG x 2 ([facility]) LIMA->LAD, VG->OM; e. 12/2014 Cath ([facility]): patent grafts->Med Rx; f. 08/2015 Cath: patent grafts; g. 11/2016 Cath: LM min irregs, LAD 20p, 34m, LCX 100p/m, RCA 20ost, 10p/m/d, VG->OM1 nl, LIMA->dLAD nl; f. 07/2017 MV: No isch, EF 51%.   Cardiomyopathy, ischemic    a. 04/2012 Echo: EF 40-45%;  b. 08/2013 Echo: EF 45-50%; c. 01/2015 Echo: EF 40-45%; d. 07/2016 Echo: EF 60-65%; e. 09/2017 Echo: EF 55-60%, Gr1 DD.   Carotid arterial disease (HCC)    a. 05/2013 Carotid U/S; bilat 40-50% ICA stenosis.   Chronic Chest Pain    USES NITRO   Chronic systolic CHF (congestive heart failure) (HCC)    a. 01/2015 Echo: EF 40-45%; b. 07/2016 Echo: EF 60-65%; c. 09/2017 Echo: EF 55-60%, Gr1 DD, nl RV fxn.   Cough    Diverticulosis    Dizziness    Dyspnea    WITH  EXERTION   Headache 1970's   migraine   History of blood transfusion 2016   post op   Hyperlipidemia    Hypertension    Iron deficiency anemia due to chronic blood loss 09/11/2016   Myocardial infarction [facility])    unsure of when   Pulmonary emboli (HCC) 07/2016   a. On Xarelto.   Reflux esophagitis    Sternal pain    a. 03/2016 s/p Sternal wire removal; b. 05/2016 s/p redo median sternotomy for sternal debridement and sternal plating.   Syncope 04/2018   CARDIOLOGIST WAS DR Mariah Milling AND HE WAS AWARE-NOW PT SEES PARASCHOS   Tubular adenoma of colon     Past Surgical History:  Procedure Laterality Date    CARDIAC CATHETERIZATION  05/01/2012   CARDIAC CATHETERIZATION  04/2013   [facility];x3 stent   CARDIAC CATHETERIZATION  01/11/15    [facility]   CARDIAC CATHETERIZATION N/A 09/16/2015   Procedure: LEFT HEART CATH AND CORS/GRAFTS ANGIOGRAPHY;  Surgeon: Antonieta Iba, MD;  Location: [facility] INVASIVE CV LAB;  Service: Cardiovascular;  Laterality: N/A;   CARPAL TUNNEL RELEASE     right hand   CATARACT EXTRACTION     LEFT   CATARACT EXTRACTION W/PHACO Left 09/16/2014   Procedure: CATARACT EXTRACTION PHACO AND INTRAOCULAR LENS PLACEMENT (IOC);  Surgeon: Lockie Mola, MD;  [facility];  Service: Ophthalmology;  Laterality: Left;   COLONOSCOPY     COLONOSCOPY WITH PROPOFOL N/A 01/28/2016   Procedure: COLONOSCOPY WITH PROPOFOL;  Surgeon: Christena Deem, MD;  Location: [facility] ENDOSCOPY;  Service: Endoscopy;  Laterality: N/A;   COLONOSCOPY WITH PROPOFOL N/A 08/23/2021   Procedure: COLONOSCOPY WITH PROPOFOL;  Surgeon: Toney Reil, MD;  Location: [facility] ENDOSCOPY;  Service: Gastroenterology;  Laterality: N/A;  REQUESTS 9AM OR LATER   CORONARY ANGIOPLASTY WITH STENT PLACEMENT  04/13/2014   CORONARY ARTERY BYPASS GRAFT  06-26-14   x3 bypasses   CORONARY PRESSURE/FFR WITH 3D MAPPING N/A 12/10/2018   Procedure: Mellody Dance PRESSURE WIRE/FFR STUDY;  Surgeon: Marcina Millard, MD;  Location: [facility] INVASIVE CV LAB;  Service: Cardiovascular;  Laterality: N/A;   CORONARY ULTRASOUND/IVUS N/A 12/10/2018   Procedure: Intravascular Ultrasound/IVUS;  Surgeon: Marcina Millard, MD;  Location: [facility] INVASIVE CV LAB;  Service: Cardiovascular;  Laterality: N/A;   CYSTOSCOPY N/A 08/14/2018   Procedure: CYSTOSCOPY;  Surgeon: Riki Altes, MD;  Location: [facility] ORS;  Service: Urology;  Laterality: N/A;   DE QUERVAIN'S RELEASE Left 08/22/2012   ESOPHAGOGASTRODUODENOSCOPY (EGD) WITH PROPOFOL N/A 04/26/2015   Procedure: ESOPHAGOGASTRODUODENOSCOPY (EGD) WITH PROPOFOL;  Surgeon: Wallace Cullens, MD;  Location: [facility]  ENDOSCOPY;  Service: Gastroenterology;  Laterality: N/A;   ESOPHAGOGASTRODUODENOSCOPY (EGD) WITH PROPOFOL N/A 05/29/2017   Procedure: ESOPHAGOGASTRODUODENOSCOPY (EGD) WITH PROPOFOL;  Surgeon: Christena Deem, MD;  Location: [facility] ENDOSCOPY;  Service: Endoscopy;  Laterality: N/A;   LEFT HEART CATH AND CORONARY ANGIOGRAPHY N/A 12/04/2016   Procedure: LEFT HEART CATH AND CORS/GRAFTS  ANGIOGRAPHY;  Surgeon: Iran Ouch, MD;  Location: [facility] INVASIVE CV LAB;  Service: Cardiovascular;  Laterality: N/A;   LEFT HEART CATH AND CORS/GRAFTS ANGIOGRAPHY N/A 04/09/2018   Procedure: LEFT HEART CATH AND CORS/GRAFTS ANGIOGRAPHY;  Surgeon: Marcina Millard, MD;  Location: [facility] INVASIVE CV LAB;  Service: Cardiovascular;  Laterality: N/A;   LEFT HEART CATH AND CORS/GRAFTS ANGIOGRAPHY N/A 12/10/2018   Procedure: LEFT HEART CATH AND CORS/GRAFTS ANGIOGRAPHY;  Surgeon: Marcina Millard, MD;  Location: [facility] INVASIVE CV LAB;  Service: Cardiovascular;  Laterality: N/A;   LEFT HEART CATH AND CORS/GRAFTS  ANGIOGRAPHY Left 05/25/2020   Procedure: LEFT HEART CATH AND CORS/GRAFTS ANGIOGRAPHY;  Surgeon: Marcina Millard, MD;  Location: [facility] INVASIVE CV LAB;  Service: Cardiovascular;  Laterality: Left;   RIB PLATING N/A 05/25/2016   Procedure: STERNAL PLATING;  Surgeon: Loreli Slot, MD;  Location: [facility] OR;  Service: Thoracic;  Laterality: N/A;   right shoulder     STERNAL WIRES REMOVAL N/A 03/30/2016   Procedure: STERNAL WIRES REMOVAL;  Surgeon: Loreli Slot, MD;  Location: [facility] OR;  Service: Thoracic;  Laterality: N/A;   TONSILLECTOMY      Facility-Administered Medications Prior to Admission  Medication Dose Route Frequency Provider Last Rate Last Admin   lidocaine (XYLOCAINE) 2 % jelly 1 application  1 application  Urethral Once Stoioff, Verna Czech, MD       Medications Prior to Admission  Medication Sig Dispense Refill Last Dose/Taking   azithromycin (ZITHROMAX) 250 MG tablet Take 250 mg by mouth daily.    Taking   predniSONE (DELTASONE) 10 MG tablet Take 10 mg by mouth.   Taking   predniSONE (DELTASONE) 2.5 MG tablet Take 2.5 mg by mouth.   Taking   sulfamethoxazole-trimethoprim (BACTRIM) 400-80 MG tablet Take 1 tablet by mouth.   Taking   atorvastatin (LIPITOR) 80 MG tablet Take by mouth.      ELIQUIS 2.5 MG TABS tablet Take 2.5 mg by mouth 2 (two) times daily.      ezetimibe (ZETIA) 10 MG tablet Take 1 tablet (10 mg total) by mouth daily. (Patient taking differently: Take 10 mg by mouth at bedtime.) 90 tablet 3    isosorbide mononitrate (IMDUR) 60 MG 24 hr tablet Take 60 mg by mouth 2 (two) times daily.       metoprolol succinate (TOPROL-XL) 25 MG 24 hr tablet Take 25 mg by mouth every evening.      nitroGLYCERIN (NITRO-BID) 2 % ointment Apply 0.5 inches topically daily as needed. (Patient not taking: Reported on 01/03/2023)      ranolazine (RANEXA) 1000 MG SR tablet Take 1 tablet (1,000 mg total) by mouth 2 (two) times daily. 180 tablet 3    Social History   Socioeconomic History   Marital status: Married    Spouse name: Not on file   Number of children: Not on file   Years of education: Not on file   Highest education level: Not on file  Occupational History   Not on file  Tobacco Use   Smoking status: Former    Current packs/day: 0.00    Average packs/day: 1 pack/day for 35.0 years (35.0 ttl pk-yrs)    Types: Cigarettes    Start date: 66    Quit date: 2009    Years since quitting: 16.1   Smokeless tobacco: Former  Building services engineer status: Never Used  Substance and Sexual Activity   Alcohol use: No   Drug use: No   Sexual activity: Yes  Other Topics Concern   Not on file  Social History Narrative   Not on file   Social Drivers of Health   Financial Resource Strain: Not on file  Food Insecurity: No Food Insecurity (03/27/2023)   Hunger Vital Sign    Worried About Running Out of Food in the Last Year: Never true    Ran Out of Food in the Last Year: Never true   Transportation Needs: No Transportation Needs (03/27/2023)   PRAPARE - Transportation    Lack of Transportation (Medical): No    Lack of  Transportation (Non-Medical): No  Physical Activity: Not on file  Stress: Not on file  Social Connections: Unknown (03/27/2023)   Social Connection and Isolation Panel [NHANES]    Frequency of Communication with Friends and Family: Three times a week    Frequency of Social Gatherings with Friends and Family: More than three times a week    Attends Religious Services: More than 4 times per year    Active Member of Clubs or Organizations: No    Attends Banker Meetings: Not on file    Marital Status: Not on file  Intimate Partner Violence: Not At Risk (03/27/2023)   Humiliation, Afraid, Rape, and Kick questionnaire    Fear of Current or Ex-Partner: No    Emotionally Abused: No    Physically Abused: No    Sexually Abused: No    Family History  Problem Relation Age of Onset   Heart attack Father 70       MI   Cancer Maternal Uncle      Vitals:   03/27/23 2313 03/28/23 0500 03/28/23 0523 03/28/23 0755  BP: (!) 139/92  (!) 113/56 (!) 140/66  Pulse:    (!) 59  Resp: 18  18 18   Temp: 97.6 F (36.4 C)  98.2 F (36.8 C) 97.7 F (36.5 C)  TempSrc: Oral  Oral   SpO2: 97%  95% 97%  Weight:  79.4 kg    Height:        PHYSICAL EXAM General: Well-appearing elderly male, well nourished, in no acute distress. HEENT: Normocephalic and atraumatic. Neck: No JVD.  Lungs: Normal respiratory effort on room air. Clear bilaterally to auscultation. No wheezes, crackles, rhonchi.  Heart: HRRR. Normal S1 and S2 without gallops or murmurs.  Abdomen: Non-distended appearing.  Msk: Normal strength and tone for age. Extremities: Warm and well perfused. No clubbing, cyanosis.  No edema.  Neuro: Alert and oriented X 3. Psych: Answers questions appropriately.   Labs: Basic Metabolic Panel: Recent Labs    03/27/23 1412 03/28/23 0534  NA 136 138  K  5.0 4.3  CL 103 109  CO2 23 23  GLUCOSE 128* 91  BUN 30* 29*  CREATININE 1.42* 1.26*  CALCIUM 9.1 8.6*   Liver Function Tests: Recent Labs    03/27/23 1412  AST 21  ALT 17  ALKPHOS 44  BILITOT 0.9  PROT 7.5  ALBUMIN 3.7   No results for input(s): "LIPASE", "AMYLASE" in the last 72 hours. CBC: Recent Labs    03/27/23 1412 03/28/23 0534  WBC 10.9* 8.7  HGB 14.4 13.3  HCT 43.2 38.4*  MCV 97.5 93.7  PLT 223 199   Cardiac Enzymes: Recent Labs    03/27/23 1412 03/27/23 1718  TROPONINIHS 13 14   BNP: No results for input(s): "BNP" in the last 72 hours. D-Dimer: No results for input(s): "DDIMER" in the last 72 hours. Hemoglobin A1C: No results for input(s): "HGBA1C" in the last 72 hours. Fasting Lipid Panel: No results for input(s): "CHOL", "HDL", "LDLCALC", "TRIG", "CHOLHDL", "LDLDIRECT" in the last 72 hours. Thyroid Function Tests: No results for input(s): "TSH", "T4TOTAL", "T3FREE", "THYROIDAB" in the last 72 hours.  Invalid input(s): "FREET3" Anemia Panel: No results for input(s): "VITAMINB12", "FOLATE", "FERRITIN", "TIBC", "IRON", "RETICCTPCT" in the last 72 hours.   Radiology: CT HEAD WO CONTRAST ( ) Result Date: 03/28/2023 CLINICAL DATA:  Neuro deficit, acute, stroke suspected Headache, neuro deficit Headache, new onset (Age >= 51y) EXAM: CT HEAD WITHOUT CONTRAST TECHNIQUE: Contiguous axial images were obtained  from the base of the skull through the vertex without intravenous contrast. RADIATION DOSE REDUCTION: This exam was performed according to the departmental dose-optimization program which includes automated exposure control, adjustment of the mA and/or kV according to patient size and/or use of iterative reconstruction technique. COMPARISON:  MRI head and CT head Jun 04, 2021. FINDINGS: Brain: No evidence of acute infarction, hemorrhage, hydrocephalus, extra-axial collection or mass lesion/mass effect. Vascular: No hyperdense vessel. Skull: No acute  fracture. Sinuses/Orbits: Clear sinuses.  No acute orbital findings. Other: No mastoid effusions. IMPRESSION: No evidence of acute intracranial abnormality. Electronically Signed   By: Feliberto Harts M.D.   On: 03/28/2023 03:32   Portable Chest 1 View Result Date: 03/27/2023 CLINICAL DATA:  Dizziness with chest pain, initial encounter EXAM: PORTABLE CHEST 1 VIEW COMPARISON:  12/08/2022 FINDINGS: Postsurgical changes are again noted. Cardiac shadow is mildly prominent but accentuated by the portable technique. Lungs are well aerated bilaterally. No focal infiltrate or effusion is seen. No bony abnormality is noted. Mild interstitial changes are again seen. IMPRESSION: No active disease. Electronically Signed   By: Alcide Clever M.D.   On: 03/27/2023 22:26    ECHO pending  TELEMETRY reviewed by me 03/28/2023: Sinus rhythm rate 60s  EKG reviewed by me: Sinus bradycardia with first-degree AV block rate 50 bpm  Data reviewed by me 03/28/2023: last 24h vitals tele labs imaging I/O ED provider note, admission H&P  Principal Problem:   Symptomatic sinus bradycardia Active Problems:   Hyperlipidemia   History of smoking   Cardiac angina (HCC)   S/P drug eluting coronary stent placement   Essential hypertension   PAF (paroxysmal atrial fibrillation) (HCC)   Chronic anticoagulation   Coronary artery disease involving coronary bypass graft of native heart with angina pectoris with documented spasm (HCC)   Anxiety   Hyperlipemia   Ischemic cardiomyopathy   Chronic Chest Pain   Hx of CABG   Iron deficiency   Protein S deficiency (HCC)   CAD (coronary artery disease)   Near syncope   Headache with neurologic deficit    ASSESSMENT AND PLAN:  Scott [name] is a 83 y.o. male  with a past medical history of coronary artery disease s/p CABG, hypertension, hyperlipidemia, ischemic cardiomyopathy, paroxysmal atrial fibrillation, history of pulmonary embolism, sinus bradycardia who presented to the ED  on 03/27/2023 for dizziness and presyncope. Cardiology was consulted for further evaluation.   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  Noted to be bradycardic in the 40-50s on telemetry.  No evidence of any high-grade AV block. -Echo pending review. -Avoid AV nodal blockers, advised patient to not resume metoprolol on discharge. -No indication for permanent pacemaker implantation at this time. -Continue Eliquis 2.5 mg twice daily for stroke risk reduction. -Will plan for Holter monitor on discharge for further evaluation.  # Coronary artery disease s/p CABG # Ischemic cardiomyopathy Patient with history of CAD and ischemic cardiomyopathy presented for dizziness as above.  Troponins normal x 2 at 13, 14.  EKG without acute ischemic changes. -Continue atorvastatin 80 mg daily, Zetia 10 mg daily. -See plan for Eliquis as above, patient has been on monotherapy.  Pending results of echocardiogram patient can be discharged home today with monitor for additional evaluation and plan for follow-up in clinic.  This patient's plan of care was discussed and created with Dr.  Callwood and he is in agreement.  Signed: Gale Journey, PA-C  03/28/2023, 9:26 AM [facility] Cardiology

## 2023-03-28 NOTE — Evaluation (Signed)
 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  Clinical Impression  Patient admitted with the above. PTA, patient lives with wife and reports independence with no AD. Patient reports hx of dizzy episodes with associated mild jerking. Patient fluctuating between minA-maxA+2 for ambulation ~50' x 2 due to high amplitude whole body jerking movements. He also experienced intermittent jerking movements in unsupported sitting. Patient reports 6/10 dizziness and endorses feeling as if he was drunk. Patient will benefit from skilled PT services during acute stay to address listed deficits. Patient will benefit from ongoing therapy at discharge to maximize functional independence and safety.       If plan is discharge home, recommend the following: Two people to help with walking and/or transfers;A lot of help with bathing/dressing/bathroom;Assistance with cooking/housework;Help with stairs or ramp for entrance;Assist for transportation   Can travel by private vehicle        Equipment Recommendations Rolling Theadora Noyes (2 wheels);BSC/3in1  Recommendations for Other Services  Rehab consult    Functional Status Assessment Patient has had a recent decline in their functional status and demonstrates the ability to make significant improvements in function in a reasonable and predictable amount of time.     Precautions / Restrictions Precautions Precautions: Fall Recall of Precautions/Restrictions: Intact Restrictions Weight Bearing Restrictions Per Provider Order: No      Mobility  Bed Mobility                General bed mobility comments: seated on EOB with OT on arrival    Transfers Overall transfer level: Needs assistance Equipment used: Rolling Tobin Cadiente (2 wheels) Transfers: Sit to/from Stand Sit to Stand: Min assist                Ambulation/Gait Ambulation/Gait assistance: Min assist, Mod assist, Max assist, +2 safety/equipment, +2 physical assistance Gait Distance (Feet): 50 Feet (+50') Assistive device: Rolling Erasmo Vertz (2 wheels) Gait Pattern/deviations: Step-through pattern, Decreased stride length Gait velocity: decreased     General Gait Details: fluctuates from min-maxA during high amplitude jerking body movements to prevent LOB. Improved with keeping one eye closed, however continued to fluctuate  Stairs            Wheelchair Mobility     Tilt Bed    Modified Rankin (Stroke Patients Only)       Balance Overall balance assessment: Needs assistance Sitting-balance support: No upper extremity supported, Feet supported Sitting balance-Leahy Scale: Fair     Standing balance support: Bilateral upper extremity supported Standing balance-Leahy Scale: Poor                               Pertinent Vitals/Pain Pain Assessment Pain Assessment: No/denies pain    Home Living Family/patient expects to be discharged to:: Private residence Living Arrangements: Spouse/significant other Available Help at Discharge: Family;Available 24 hours/day Type of Home: House Home Access: Ramped entrance       Home Layout: One level Home Equipment: Agricultural consultant (2 wheels);Cane - single point      Prior Function Prior Level of Function : Independent/Modified Independent;History of Falls (last six months)  Extremity/Trunk Assessment   Upper Extremity Assessment Upper Extremity Assessment: Defer to OT evaluation    Lower Extremity Assessment Lower Extremity Assessment: RLE deficits/detail;LLE deficits/detail RLE Deficits /  Details: strength grossly 4+/5, high amplitude jerky movements with standing (endorses 6/10 dizziness) LLE Deficits / Details: strength grossly 4+/5, high amplitude jerky movements with standing (endorses 6/10 dizziness)       Communication   Communication Communication: No apparent difficulties    Cognition Arousal: Alert Behavior During Therapy: WFL for tasks assessed/performed   PT - Cognitive impairments: No apparent impairments                         Following commands: Intact       Cueing Cueing Techniques: Verbal cues     General Comments      Exercises     Assessment/Plan    PT Assessment Patient needs continued PT services  PT Problem List Decreased strength;Decreased activity tolerance;Decreased balance;Decreased mobility;Decreased knowledge of precautions;Decreased safety awareness       PT Treatment Interventions DME instruction;Gait training;Functional mobility training;Therapeutic activities;Therapeutic exercise;Balance training;Patient/family education    PT Goals (Current goals can be found in the Care Plan section)  Acute Rehab PT Goals Patient Stated Goal: to go home PT Goal Formulation: With patient/family Time For Goal Achievement: 04/11/23 Potential to Achieve Goals: Fair    Frequency Min 3X/week     Co-evaluation PT/OT/SLP Co-Evaluation/Treatment: Yes Reason for Co-Treatment: For patient/therapist safety;To address functional/ADL transfers PT goals addressed during session: Mobility/safety with mobility OT goals addressed during session: ADL's and self-care       AM-PAC PT "6 Clicks" Mobility  Outcome Measure Help needed turning from your back to your side while in a flat bed without using bedrails?: None Help needed moving from lying on your back to sitting on the side of a flat bed without using bedrails?: None Help needed moving to and from a bed to a chair (including a wheelchair)?: A Little Help needed standing up from  a chair using your arms (e.g., wheelchair or bedside chair)?: A Little Help needed to walk in hospital room?: Total Help needed climbing 3-5 steps with a railing? : Total 6 Click Score: 16    End of Session Equipment Utilized During Treatment: Gait belt Activity Tolerance: Patient tolerated treatment well Patient left: in chair;with call bell/phone within reach;with chair alarm set;with family/visitor present Nurse Communication: Mobility status PT Visit Diagnosis: Unsteadiness on feet (R26.81);Other abnormalities of gait and mobility (R26.89);Difficulty in walking, not elsewhere classified (R26.2);Muscle weakness (generalized) (M62.81);Dizziness and giddiness (R42)    Time: 8295-6213 PT Time Calculation (min) (ACUTE ONLY): 30 min   Charges:   PT Evaluation $PT Eval High Complexity: 1 High PT Treatments $Therapeutic Activity: 8-22 mins PT General Charges $$ ACUTE PT VISIT: 1 Visit         Maylon Peppers, PT, DPT Physical Therapist - [facility]  [facility]   Maheen Cwikla A Clois Treanor 03/28/2023, 1:11 PM

## 2023-03-28 NOTE — Evaluation (Signed)
 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    Clinical Impressions Scott [name] was seen for OT evaluation this date. Prior to hospital admission, pt was IND however hx of dizzy episodes. Pt lives with spouse. Pt currently fluctuates from MIN A - MAX A x2 for ADL t/f ~50 ft x2 with multiple standing and 1 seated rest break - increased assist to prevent fall 2/2 high amplitude jerky movement. Pt endorses feeling as if he is drunk or on a ship (6/10 dizziness, improves with 1 eye occluded. MOD I seated grooming or feeding tasks however intermittent jerking noted in unsupported sitting, self corrects. Pt would benefit from skilled OT to address noted impairments and functional limitations (see below for any additional details). Upon hospital discharge, recommend OT follow up >3 hours/day.     If plan is discharge home, recommend the following:   Two people to help with walking and/or transfers;Two people to help with bathing/dressing/bathroom;Help with stairs or ramp for entrance     Functional Status Assessment   Patient has had a recent decline in their functional status and demonstrates the ability to make significant improvements in function in a reasonable and predictable amount of time.     Equipment Recommendations   Other (comment) (defer)     Recommendations for Other Services   Rehab consult     Precautions/Restrictions   Precautions Precautions: Fall Recall of Precautions/Restrictions: Intact Restrictions Weight Bearing Restrictions Per Provider Order: No     Mobility Bed  Mobility Overal bed mobility: Modified Independent                  Transfers Overall transfer level: Needs assistance Equipment used: Rolling walker (2 wheels) Transfers: Sit to/from Stand Sit to Stand: Min assist                  Balance Overall balance assessment: Needs assistance Sitting-balance support: No upper extremity supported, Feet supported Sitting balance-Leahy Scale: Fair     Standing balance support: Bilateral upper extremity supported Standing balance-Leahy Scale: Poor                             ADL either performed or assessed with clinical judgement   ADL Overall ADL's : Needs assistance/impaired                                       General ADL Comments: Fluctuates from MIN A - MAX A x2 for ADL t/f, increased assist to prevent fall 2/2 high amplitude jerky movement (Pt endorses feeling as if he is drunk or on a ship). MOD I seated grooming or feeding tasks.      Pertinent Vitals/Pain Pain Assessment Pain Assessment: No/denies pain     Extremity/Trunk Assessment Upper Extremity Assessment Upper Extremity Assessment: Overall WFL for tasks assessed   Lower Extremity Assessment Lower Extremity Assessment: RLE deficits/detail;LLE deficits/detail RLE Deficits / Details: strength grossly 4+/5, high amplitude jerky movements with standing (endorses 6/10 dizziness) LLE Deficits / Details: strength grossly 4+/5, high amplitude jerky movements with standing (endorses  6/10 dizziness)       Communication Communication Communication: No apparent difficulties   Cognition Arousal: Alert Behavior During Therapy: WFL for tasks assessed/performed Cognition: No apparent impairments                               Following commands: Intact       Cueing  General Comments   Cueing Techniques: Verbal cues      Exercises     Shoulder Instructions      Home Living Family/patient expects to be  discharged to:: Private residence Living Arrangements: Spouse/significant other Available Help at Discharge: Family;Available 24 hours/day Type of Home: House Home Access: Ramped entrance     Home Layout: One level     Bathroom Shower/Tub: Walk-in shower;Tub/shower unit         Home Equipment: Agricultural consultant (2 wheels);Cane - single point          Prior Functioning/Environment Prior Level of Function : Independent/Modified Independent;History of Falls (last six months)                    OT Problem List: Decreased activity tolerance;Impaired balance (sitting and/or standing);Decreased safety awareness   OT Treatment/Interventions: Self-care/ADL training;Therapeutic exercise;Energy conservation;DME and/or AE instruction;Therapeutic activities;Balance training;Patient/family education      OT Goals(Current goals can be found in the care plan section)   Acute Rehab OT Goals Patient Stated Goal: to go home OT Goal Formulation: With patient/family Time For Goal Achievement: 04/11/23 Potential to Achieve Goals: Good ADL Goals Pt Will Perform Grooming: with modified independence;standing Pt Will Perform Lower Body Dressing: with modified independence;sit to/from stand Pt Will Transfer to Toilet: with modified independence;ambulating;regular height toilet   OT Frequency:  Min 1X/week    Co-evaluation PT/OT/SLP Co-Evaluation/Treatment: Yes Reason for Co-Treatment: For patient/therapist safety;To address functional/ADL transfers PT goals addressed during session: Mobility/safety with mobility OT goals addressed during session: ADL's and self-care      AM-PAC OT "6 Clicks" Daily Activity     Outcome Measure Help from another person eating meals?: None Help from another person taking care of personal grooming?: A Little Help from another person toileting, which includes using toliet, bedpan, or urinal?: A Lot Help from another person bathing (including washing,  rinsing, drying)?: A Lot Help from another person to put on and taking off regular upper body clothing?: A Little Help from another person to put on and taking off regular lower body clothing?: A Lot 6 Click Score: 16   End of Session Equipment Utilized During Treatment: Gait belt;Rolling walker (2 wheels) Nurse Communication: Mobility status  Activity Tolerance: Patient tolerated treatment well Patient left: in chair;with call bell/phone within reach;with chair alarm set;with family/visitor present  OT Visit Diagnosis: Other abnormalities of gait and mobility (R26.89);Muscle weakness (generalized) (M62.81)                Time: 1000-1047 OT Time Calculation (min): 47 min Charges:  OT General Charges $OT Visit: 1 Visit OT Evaluation $OT Eval High Complexity: 1 High OT Treatments $Self Care/Home Management : 8-22 mins  Kathie Dike, M.S. OTR/L  03/28/23, 1:01 PM  ascom [phone]

## 2023-03-28 NOTE — Progress Notes (Signed)
 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 ... has had prior episode of near syncope, dizziness before however they would normally resolve after a few minutes or seconds. However today the symptoms continued. In addition he endorses a new headache that he has never experienced before. ..." See H&P for full HPI on admission & ED course.  Pt was admitted for further evaluation and management. Further hospital course and management as outlined below.   Consults: Cardiology Neurology   Assessment and Plan:  Symptomatic sinus bradycardia Hx of bradycardia Pre-syncope / Dizziness Paroxysmal A-fib --Cardiology consulted --Hold metoprolol, Imdur, Ranexa --Echo pending - follow up results --Telemetry --Ambulatory monitor to be placed for d/c --Indication for permanent pacemaker --Avoid AV nodal blockers - stop metoprolol on d/c --Continue Eliquis  Headache with neurologic deficit Involuntary Myoclonic Jerking Movements Acute ambulatory dysfunction due to jerking History of dizziness - current episode not resolving as typical episodes will, per pt.   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 He reports still feeling dizzy.  Denies room spinning or visual changes like objects shifting.  Does report difficulty with his walking and equilibrium but states due to sudden jerking movements that are new.  Denies headache currently.   No chest pain.   Physical Exam: Vitals:   03/28/23 0931 03/28/23 1325 03/28/23 1518 03/28/23 1530  BP: 115/71 121/64 131/69 (!) 100/53  Pulse: (!) 58 60    Resp:      Temp:  98 F (36.7 C)    TempSrc:  Oral    SpO2:  96%    Weight:      Height:       General exam: awake, alert, no acute distress HEENT: atraumatic, clear conjunctiva, anicteric sclera,moist mucus membranes, hearing grossly normal  Respiratory system: CTAB, no wheezes, rales or rhonchi, normal respiratory effort. Cardiovascular system: normal S1/S2, RRR, no JVD, murmurs, rubs, gallops, no pedal edema.   Gastrointestinal system: soft, NT, ND, no HSM felt, +bowel sounds. Central nervous system: A&O x 4. no gross focal neurologic deficits, normal speech Extremities: moves all, no edema, normal tone Skin: dry, intact, normal temperature Psychiatry: normal mood, congruent affect, judgement and insight appear normal   Data Reviewed:  Notable labs -- BMP with BUN 29, Cr 1.26, Ca 8.6 CBC unremarkable  Troponin: 16 >> 16 today normal  MRI brain - negative for stroke.  Mild small vessel ischemic changes,  mild generalized atrophy --Incompletely assessed cervical spondylosis at c3-c4 with mild ventral cord flattening consistent with  moderate spinal  canal stenosis   Family Communication: wife at bedside  Disposition: Status is: Observation Remains admitted due to persistent symptoms and ongoing evaluation as outlined above   Planned Discharge Destination: Home    Time spent: 45 minutes  Author: Pennie Banter, DO 03/28/2023 3:41 PM  For on call review [email or web address].

## 2023-03-29 ENCOUNTER — Inpatient Hospital Stay

## 2023-03-29 DIAGNOSIS — H6123 Impacted cerumen, bilateral: Secondary | ICD-10-CM | POA: Diagnosis not present

## 2023-03-29 DIAGNOSIS — R42 Dizziness and giddiness: Secondary | ICD-10-CM

## 2023-03-29 DIAGNOSIS — Z87891 Personal history of nicotine dependence: Secondary | ICD-10-CM | POA: Diagnosis not present

## 2023-03-29 DIAGNOSIS — Z91041 Radiographic dye allergy status: Secondary | ICD-10-CM | POA: Diagnosis not present

## 2023-03-29 DIAGNOSIS — E785 Hyperlipidemia, unspecified: Secondary | ICD-10-CM | POA: Diagnosis present

## 2023-03-29 DIAGNOSIS — N179 Acute kidney failure, unspecified: Secondary | ICD-10-CM | POA: Diagnosis present

## 2023-03-29 DIAGNOSIS — G8929 Other chronic pain: Secondary | ICD-10-CM | POA: Diagnosis present

## 2023-03-29 DIAGNOSIS — I25110 Atherosclerotic heart disease of native coronary artery with unstable angina pectoris: Secondary | ICD-10-CM | POA: Diagnosis present

## 2023-03-29 DIAGNOSIS — I110 Hypertensive heart disease with heart failure: Secondary | ICD-10-CM | POA: Diagnosis present

## 2023-03-29 DIAGNOSIS — R55 Syncope and collapse: Secondary | ICD-10-CM

## 2023-03-29 DIAGNOSIS — Z79899 Other long term (current) drug therapy: Secondary | ICD-10-CM | POA: Diagnosis not present

## 2023-03-29 DIAGNOSIS — I5042 Chronic combined systolic (congestive) and diastolic (congestive) heart failure: Secondary | ICD-10-CM | POA: Diagnosis present

## 2023-03-29 DIAGNOSIS — R519 Headache, unspecified: Secondary | ICD-10-CM | POA: Diagnosis present

## 2023-03-29 DIAGNOSIS — Z885 Allergy status to narcotic agent status: Secondary | ICD-10-CM | POA: Diagnosis not present

## 2023-03-29 DIAGNOSIS — Z7951 Long term (current) use of inhaled steroids: Secondary | ICD-10-CM | POA: Diagnosis not present

## 2023-03-29 DIAGNOSIS — E538 Deficiency of other specified B group vitamins: Secondary | ICD-10-CM | POA: Diagnosis present

## 2023-03-29 DIAGNOSIS — Z7901 Long term (current) use of anticoagulants: Secondary | ICD-10-CM | POA: Diagnosis not present

## 2023-03-29 DIAGNOSIS — I6523 Occlusion and stenosis of bilateral carotid arteries: Secondary | ICD-10-CM | POA: Diagnosis present

## 2023-03-29 DIAGNOSIS — R001 Bradycardia, unspecified: Secondary | ICD-10-CM | POA: Diagnosis present

## 2023-03-29 DIAGNOSIS — G253 Myoclonus: Secondary | ICD-10-CM | POA: Diagnosis present

## 2023-03-29 DIAGNOSIS — R569 Unspecified convulsions: Secondary | ICD-10-CM | POA: Diagnosis not present

## 2023-03-29 DIAGNOSIS — I255 Ischemic cardiomyopathy: Secondary | ICD-10-CM | POA: Diagnosis present

## 2023-03-29 DIAGNOSIS — Z955 Presence of coronary angioplasty implant and graft: Secondary | ICD-10-CM | POA: Diagnosis not present

## 2023-03-29 DIAGNOSIS — H6120 Impacted cerumen, unspecified ear: Secondary | ICD-10-CM | POA: Diagnosis present

## 2023-03-29 DIAGNOSIS — Z8249 Family history of ischemic heart disease and other diseases of the circulatory system: Secondary | ICD-10-CM | POA: Diagnosis not present

## 2023-03-29 DIAGNOSIS — F419 Anxiety disorder, unspecified: Secondary | ICD-10-CM | POA: Diagnosis present

## 2023-03-29 DIAGNOSIS — I951 Orthostatic hypotension: Secondary | ICD-10-CM | POA: Diagnosis present

## 2023-03-29 DIAGNOSIS — I480 Paroxysmal atrial fibrillation: Secondary | ICD-10-CM | POA: Diagnosis present

## 2023-03-29 DIAGNOSIS — I209 Angina pectoris, unspecified: Secondary | ICD-10-CM | POA: Diagnosis not present

## 2023-03-29 LAB — VITAMIN B12: Vitamin B-12: 142 pg/mL — ABNORMAL LOW (ref 180–914)

## 2023-03-29 LAB — TSH: TSH: 3.114 u[IU]/mL (ref 0.350–4.500)

## 2023-03-29 LAB — AMMONIA: Ammonia: 31 umol/L (ref 9–35)

## 2023-03-29 MED ORDER — CYANOCOBALAMIN 1000 MCG/ML IJ SOLN
1000.0000 ug | Freq: Once | INTRAMUSCULAR | Status: AC
  Administered 2023-03-29: 1000 ug via INTRAMUSCULAR
  Filled 2023-03-29: qty 1

## 2023-03-29 MED ORDER — ISOSORBIDE MONONITRATE ER 60 MG PO TB24
60.0000 mg | ORAL_TABLET | Freq: Two times a day (BID) | ORAL | Status: DC
  Administered 2023-03-29 – 2023-03-30 (×3): 60 mg via ORAL
  Filled 2023-03-29 (×3): qty 1

## 2023-03-29 MED ORDER — RANOLAZINE ER 500 MG PO TB12
1000.0000 mg | ORAL_TABLET | Freq: Two times a day (BID) | ORAL | Status: DC
  Administered 2023-03-29 – 2023-03-30 (×3): 1000 mg via ORAL
  Filled 2023-03-29 (×4): qty 2

## 2023-03-29 NOTE — Progress Notes (Signed)
 Eeg done

## 2023-03-29 NOTE — Progress Notes (Signed)
 [facility] CLINIC CARDIOLOGY PROGRESS NOTE       Patient ID: Scott [name] MRN: [id number] DOB/AGE: 81-Jan-1942 81 y.o.  Admit date: 03/27/2023 Referring Physician Dr. Londell Moh Primary Physician [name], MD  Primary Cardiologist Dr. Darrold Junker Reason for Consultation dizziness, presyncope  HPI: Scott [name] is a 83 y.o. male  with a past medical history of coronary artery disease s/p CABG, hypertension, hyperlipidemia, ischemic cardiomyopathy, paroxysmal atrial fibrillation, history of pulmonary embolism, sinus bradycardia who presented to the ED on 03/27/2023 for dizziness and presyncope. Cardiology was consulted for further evaluation.   Interval history: -Patient seen and examined this AM, sitting upright in bed with wife present at bedside.  -Reports he feels better overall today, had some dizziness yesterday with standing.  -HR remains in the 50-60s, no evidence of high grade AVB.  -BP stable.   Review of systems complete and found to be negative unless listed above    Past Medical History:  Diagnosis Date   Arthritis    right hip, since a fall   CAD (coronary artery disease)    a. 04/2012 Cath: 3VD->Med Rx;  b. 04/2013 PCI RCA (2 DES); c. 03/2014 PCI: LAD 80 (3.0x23 Xience Alpine DES); d. 06/2014 CABG x 2 ([facility]) LIMA->LAD, VG->OM; e. 12/2014 Cath ([facility]): patent grafts->Med Rx; f. 08/2015 Cath: patent grafts; g. 11/2016 Cath: LM min irregs, LAD 20p, 54m, LCX 100p/m, RCA 20ost, 10p/m/d, VG->OM1 nl, LIMA->dLAD nl; f. 07/2017 MV: No isch, EF 51%.   Cardiomyopathy, ischemic    a. 04/2012 Echo: EF 40-45%;  b. 08/2013 Echo: EF 45-50%; c. 01/2015 Echo: EF 40-45%; d. 07/2016 Echo: EF 60-65%; e. 09/2017 Echo: EF 55-60%, Gr1 DD.   Carotid arterial disease (HCC)    a. 05/2013 Carotid U/S; bilat 40-50% ICA stenosis.   Chronic Chest Pain    USES NITRO   Chronic systolic CHF (congestive heart failure) (HCC)    a. 01/2015 Echo: EF 40-45%; b. 07/2016 Echo: EF 60-65%; c. 09/2017 Echo: EF 55-60%, Gr1 DD,  nl RV fxn.   Cough    Diverticulosis    Dizziness    Dyspnea    WITH EXERTION   Headache 1970's   migraine   History of blood transfusion 2016   post op   Hyperlipidemia    Hypertension    Iron deficiency anemia due to chronic blood loss 09/11/2016   Myocardial infarction [facility])    unsure of when   Pulmonary emboli (HCC) 07/2016   a. On Xarelto.   Reflux esophagitis    Sternal pain    a. 03/2016 s/p Sternal wire removal; b. 05/2016 s/p redo median sternotomy for sternal debridement and sternal plating.   Syncope 04/2018   CARDIOLOGIST WAS DR Mariah Milling AND HE WAS AWARE-NOW PT SEES PARASCHOS   Tubular adenoma of colon     Past Surgical History:  Procedure Laterality Date   CARDIAC CATHETERIZATION  05/01/2012   CARDIAC CATHETERIZATION  04/2013   [facility];x3 stent   CARDIAC CATHETERIZATION  01/11/15    [facility]   CARDIAC CATHETERIZATION N/A 09/16/2015   Procedure: LEFT HEART CATH AND CORS/GRAFTS ANGIOGRAPHY;  Surgeon: Antonieta Iba, MD;  Location: [facility] INVASIVE CV LAB;  Service: Cardiovascular;  Laterality: N/A;   CARPAL TUNNEL RELEASE     right hand   CATARACT EXTRACTION     LEFT   CATARACT EXTRACTION W/PHACO Left 09/16/2014   Procedure: CATARACT EXTRACTION PHACO AND INTRAOCULAR LENS PLACEMENT (IOC);  Surgeon: Lockie Mola, MD;  Location: [facility];  Service: Ophthalmology;  Laterality: Left;   COLONOSCOPY     COLONOSCOPY WITH PROPOFOL N/A 01/28/2016   Procedure: COLONOSCOPY WITH PROPOFOL;  Surgeon: Christena Deem, MD;  Location: [facility] END[facility]COPY;  Service: Endoscopy;  Laterality: N/A;   COLONOSCOPY WITH PROPOFOL N/A 08/23/2021   Procedure: COLONOSCOPY WITH PROPOFOL;  Surgeon: Toney Reil, MD;  Location: [facility] ENDOSCOPY;  Service: Gastroenterology;  Laterality: N/A;  REQUESTS 9AM OR LATER   CORONARY ANGIOPLASTY WITH STENT PLACEMENT  04/13/2014   CORONARY ARTERY BYPASS GRAFT  06-26-14   x3 bypasses   CORONARY PRESSURE/FFR WITH 3D MAPPING N/A 12/10/2018   Procedure:  Mellody Dance PRESSURE WIRE/FFR STUDY;  Surgeon: Marcina Millard, MD;  Location: [facility] INVASIVE CV LAB;  Service: Cardiovascular;  Laterality: N/A;   CORONARY ULTRASOUND/IVUS N/A 12/10/2018   Procedure: Intravascular Ultrasound/IVUS;  Surgeon: Marcina Millard, MD;  Location: [facility] INVASIVE CV LAB;  Service: Cardiovascular;  Laterality: N/A;   CYSTOSCOPY N/A 08/14/2018   Procedure: CYSTOSCOPY;  Surgeon: Riki Altes, MD;  Location: [facility] ORS;  Service: Urology;  Laterality: N/A;   DE QUERVAIN'S RELEASE Left 08/22/2012   ESOPHAGOGASTRODUODENOSCOPY (EGD) WITH PROPOFOL N/A 04/26/2015   Procedure: ESOPHAGOGASTRODUODENOSCOPY (EGD) WITH PROPOFOL;  Surgeon: Wallace Cullens, MD;  Location[facility]COPY;  Service: Gastroenterology;  Laterality: N/A;   ESOPHAGOGASTRODUODENOSCOPY (EGD) WITH PROPOFOL N/A 05/29/2017   Procedure: ESOPHAGOGASTRODUODENOSCOPY (EGD) WITH PROPOFOL;  Surgeon: Christena Deem, MD;  Location: [facility] ENDOSCOPY;  Service: Endoscopy;  Laterality: N/A;   LEFT HEART CATH AND CORONARY ANGIOGRAPHY N/A 12/04/2016   Procedure: LEFT HEART CATH AND CORS/GRAFTS  ANGIOGRAPHY;  Surgeon: Iran Ouch, MD;  Location: [facility] INVASIVE CV LAB;  Service: Cardiovascular;  Laterality: N/A;   LEFT HEART CATH AND CORS/GRAFTS ANGIOGRAPHY N/A 04/09/2018   Procedure: LEFT HEART CATH AND CORS/GRAFTS ANGIOGRAPHY;  Surgeon: Marcina Millard, MD;  Location: [facility] INVASIVE CV LAB;  Service: Cardiovascular;  Laterality: N/A;   LEFT HEART CATH AND CORS/GRAFTS ANGIOGRAPHY N/A 12/10/2018   Procedure: LEFT HEART CATH AND CORS/GRAFTS ANGIOGRAPHY;  Surgeon: Marcina Millard, MD;  Location: [facility] INVASIVE CV LAB;  Service: Cardiovascular;  Laterality: N/A;   LEFT HEART CATH AND CORS/GRAFTS ANGIOGRAPHY Left 05/25/2020   Procedure: LEFT HEART CATH AND CORS/GRAFTS ANGIOGRAPHY;  Surgeon: Marcina Millard, MD;  Location: [facility] INVASIVE CV LAB;  Service: Cardiovascular;  Laterality: Left;   RIB PLATING N/A 05/25/2016    Procedure: STERNAL PLATING;  Surgeon: Loreli Slot, MD;  Location[facility] OR;  Service: Thoracic;  Laterality: N/A;   right shoulder     STERNAL WIRES REMOVAL N/A 03/30/2016   Procedure: STERNAL WIRES REMOVAL;  Surgeon: Loreli Slot, MD;  Location: [facility] OR;  Service: Thoracic;  Laterality: N/A;   TONSILLECTOMY      Facility-Administered Medications Prior to Admission  Medication Dose Route Frequency Provider Last Rate Last Admin   lidocaine (XYLOCAINE) 2 % jelly 1 application  1 application  Urethral Once Stoioff, Verna Czech, MD       Medications Prior to Admission  Medication Sig Dispense Refill Last Dose/Taking   [EXPIRED] azithromycin (ZITHROMAX) 250 MG tablet Take 250 mg by mouth daily.   03/27/2023 Morning   ELIQUIS 2.5 MG TABS tablet Take 2.5 mg by mouth 2 (two) times daily.   03/27/2023 Morning   ezetimibe (ZETIA) 10 MG tablet Take 1 tablet (10 mg total) by mouth daily. (Patient taking differently: Take 10 mg by mouth at bedtime.) 90 tablet 3 03/26/2023   fluticasone-salmeterol (ADVAIR) 250-50 MCG/ACT AEPB Inhale 1 puff into the lungs in  the morning and at bedtime.   03/27/2023 Morning   Ipratropium-Albuterol (COMBIVENT) 20-100 MCG/ACT AERS respimat Inhale 2 puffs into the lungs 4 (four) times daily as needed for wheezing.   03/27/2023   isosorbide mononitrate (IMDUR) 60 MG 24 hr tablet Take 60 mg by mouth 2 (two) times daily.    03/27/2023 Morning   predniSONE (DELTASONE) 10 MG tablet Take 10 mg by mouth.   03/27/2023 Morning   predniSONE (DELTASONE) 2.5 MG tablet Take 2.5 mg by mouth.   Taking   ranolazine (RANEXA) 1000 MG SR tablet Take 1 tablet (1,000 mg total) by mouth 2 (two) times daily. 180 tablet 3 03/27/2023 Morning   sulfamethoxazole-trimethoprim (BACTRIM) 400-80 MG tablet Take 1 tablet by mouth.   03/26/2023   atorvastatin (LIPITOR) 80 MG tablet Take 80 mg by mouth daily.   03/26/2023   metoprolol succinate (TOPROL-XL) 25 MG 24 hr tablet Take 25 mg by mouth every evening.   03/26/2023    nitroGLYCERIN (NITRO-BID) 2 % ointment Apply 0.5 inches topically daily as needed. (Patient not taking: Reported on 01/03/2023)      Social History   Socioeconomic History   Marital status: Married    Spouse name: Not on file   Number of children: Not on file   Years of education: Not on file   Highest education level: Not on file  Occupational History   Not on file  Tobacco Use   Smoking status: Former    Current packs/day: 0.00    Average packs/day: 1 pack/day for 35.0 years (35.0 ttl pk-yrs)    Types: Cigarettes    Start date: 27    Quit date: 2009    Years since quitting: 16.1   Smokeless tobacco: Former  Building services engineer status: Never Used  Substance and Sexual Activity   Alcohol use: No   Drug use: No   Sexual activity: Yes  Other Topics Concern   Not on file  Social History Narrative   Not on file   Social Drivers of Health   Financial Resource Strain: Not on file  Food Insecurity: No Food Insecurity (03/27/2023)   Hunger Vital Sign    Worried About Running Out of Food in the Last Year: Never true    Ran Out of Food in the Last Year: Never true  Transportation Needs: No Transportation Needs (03/27/2023)   PRAPARE - Administrator, Civil Service (Medical): No    Lack of Transportation (Non-Medical): No  Physical Activity: Not on file  Stress: Not on file  Social Connections: Unknown (03/27/2023)   Social Connection and Isolation Panel [NHANES]    Frequency of Communication with Friends and Family: Three times a week    Frequency of Social Gatherings with Friends and Family: More than three times a week    Attends Religious Services: More than 4 times per year    Active Member of [employer] or Organizations: No    Attends Banker Meetings: Not on file    Marital Status: Not on file  Intimate Partner Violence: Not At Risk (03/27/2023)   Humiliation, Afraid, Rape, and Kick questionnaire    Fear of Current or Ex-Partner: No    Emotionally  Abused: No    Physically Abused: No    Sexually Abused: No    Family History  Problem Relation Age of Onset   Heart attack Father 18       MI   Cancer Maternal Uncle  Vitals:   03/29/23 0415 03/29/23 0500 03/29/23 0745 03/29/23 0955  BP: 128/66  127/72 (!) 140/57  Pulse: (!) 53  (!) 52 (!) 58  Resp: 18     Temp: 97.9 F (36.6 C)     TempSrc: Oral     SpO2: 97%  96% 99%  Weight:  78.8 kg    Height:        PHYSICAL EXAM General: Well-appearing elderly male, well nourished, in no acute distress. HEENT: Normocephalic and atraumatic. Neck: No JVD.  Lungs: Normal respiratory effort on room air. Clear bilaterally to auscultation. No wheezes, crackles, rhonchi.  Heart: HRRR. Normal S1 and S2 without gallops or murmurs.  Abdomen: Non-distended appearing.  Msk: Normal strength and tone for age. Extremities: Warm and well perfused. No clubbing, cyanosis.  No edema.  Neuro: Alert and oriented X 3. Psych: Answers questions appropriately.   Labs: Basic Metabolic Panel: Recent Labs    03/27/23 1412 03/28/23 0534  NA 136 138  K 5.0 4.3  CL 103 109  CO2 23 23  GLUCOSE 128* 91  BUN 30* 29*  CREATININE 1.42* 1.26*  CALCIUM 9.1 8.6*   Liver Function Tests: Recent Labs    03/27/23 1412  AST 21  ALT 17  ALKPHOS 44  BILITOT 0.9  PROT 7.5  ALBUMIN 3.7   No results for input(s): "LIPASE", "AMYLASE" in the last 72 hours. CBC: Recent Labs    03/27/23 1412 03/28/23 0534  WBC 10.9* 8.7  HGB 14.4 13.3  HCT 43.2 38.4*  MCV 97.5 93.7  PLT 223 199   Cardiac Enzymes: Recent Labs    03/27/23 1718 03/28/23 1545 03/28/23 1716  TROPONINIHS 14 16 16    BNP: No results for input(s): "BNP" in the last 72 hours. D-Dimer: No results for input(s): "DDIMER" in the last 72 hours. Hemoglobin A1C: No results for input(s): "HGBA1C" in the last 72 hours. Fasting Lipid Panel: No results for input(s): "CHOL", "HDL", "LDLCALC", "TRIG", "CHOLHDL", "LDLDIRECT" in the last 72  hours. Thyroid Function Tests: No results for input(s): "TSH", "T4TOTAL", "T3FREE", "THYROIDAB" in the last 72 hours.  Invalid input(s): "FREET3" Anemia Panel: No results for input(s): "VITAMINB12", "FOLATE", "FERRITIN", "TIBC", "IRON", "RETICCTPCT" in the last 72 hours.   Radiology: MR BRAIN WO CONTRAST Result Date: 03/28/2023 CLINICAL DATA:  Provided history: Headache, new onset. Persistent dizziness. EXAM: MRI HEAD WITHOUT CONTRAST TECHNIQUE: Multiplanar, multiecho pulse sequences of the brain and surrounding structures were obtained without intravenous contrast. COMPARISON:  Head CT 03/27/2023. FINDINGS: Intermittently motion degraded examination. Most notably, the axial T2 and axial FLAIR sequences are moderately motion degraded. Within this limitation, findings are as follows. Brain: Mild generalized cerebral atrophy. Multifocal T2 FLAIR hyperintense signal abnormality within the cerebral white matter, nonspecific but compatible with mild chronic small vessel ischemic disease. Punctate chronic microhemorrhage within the left cerebellar hemisphere (series 10, image 14). No cortical encephalomalacia is identified. There is no acute infarct. No evidence of an intracranial mass. No extra-axial fluid collection. No midline shift. Vascular: Maintained flow voids within the proximal large arterial vessels. Skull and upper cervical spine: No focal worrisome marrow lesion. Incompletely assessed cervical spondylosis. At C3-C4, a central disc protrusion contributes to apparent moderate spinal canal stenosis (with mild flattening of the ventral spinal cord). 3 mm C2-C3 grade 1 anterolisthesis. Sinuses/Orbits: No mass or acute finding within the imaged orbits. Prior bilateral ocular lens replacement. Minimal mucosal thickening scattered within bilateral ethmoid air cells. IMPRESSION: 1. Intermittently motion degraded examination. 2. No evidence of an  acute intracranial abnormality. The diffusion-weighted imaging is  of good quality and there is no evidence of an acute infarct. 3. Mild chronic small vessel ischemic changes within the cerebral white matter. 4. Mild generalized cerebral atrophy. 5. Minor ethmoid sinus mucosal thickening bilaterally. 6. Incompletely assessed cervical spondylosis. At C3-C4, a central disc protrusion contributes to apparent moderate spinal canal stenosis (with mild flattening of the ventral spinal cord). Electronically Signed   By: Jackey Loge D.O.   On: 03/28/2023 14:12   ECHOCARDIOGRAM COMPLETE Result Date: 03/28/2023    ECHOCARDIOGRAM REPORT   Patient Name:   Scott [name] Date of Exam: 03/27/2023 Medical Rec #:  [id number]      Height:       70.0 in Accession #:    [id number]     Weight:       176.1 lb Date of Birth:  17-Jan-1941       BSA:          1.978 m Patient Age:    82 years       BP:           127/63 mmHg Patient Gender: M              HR:           57 bpm. Exam Location:  [facility] Procedure: 2D Echo, Cardiac Doppler and Color Doppler (Both Spectral and Color            Flow Doppler were utilized during procedure). Indications:     R55 Syncope  History:         Patient has prior history of Echocardiogram examinations, most                  recent 03/21/2019. Cardiomyopathy, CAD, Signs/Symptoms:Dyspnea                  and Syncope; Risk Factors:Hypertension and Dyslipidemia.                  Pulmonary Emboli.  Sonographer:     Daphine Deutscher RDCS Referring Phys:  [id number] AMY N COX Diagnosing Phys: Alwyn Pea MD IMPRESSIONS  1. Left ventricular ejection fraction, by estimation, is 40 to 45%. The left ventricle has normal function. The left ventricle demonstrates global hypokinesis. Left ventricular diastolic parameters are consistent with Grade III diastolic dysfunction (restrictive).  2. Right ventricular systolic function is normal. The right ventricular size is normal.  3. Left atrial size was moderately dilated.  4. Right atrial size was moderately dilated.  5. The mitral valve  is normal in structure. Mild mitral valve regurgitation.  6. The aortic valve is normal in structure. Aortic valve regurgitation is not visualized. FINDINGS  Left Ventricle: Left ventricular ejection fraction, by estimation, is 40 to 45%. The left ventricle has normal function. The left ventricle demonstrates global hypokinesis. The left ventricular internal cavity size was normal in size. There is no left ventricular hypertrophy. Left ventricular diastolic parameters are consistent with Grade III diastolic dysfunction (restrictive). Right Ventricle: The right ventricular size is normal. No increase in right ventricular wall thickness. Right ventricular systolic function is normal. Left Atrium: Left atrial size was moderately dilated. Right Atrium: Right atrial size was moderately dilated. Pericardium: There is no evidence of pericardial effusion. Mitral Valve: The mitral valve is normal in structure. Mild mitral valve regurgitation. Tricuspid Valve: The tricuspid valve is normal in structure. Tricuspid valve regurgitation is mild. Aortic Valve: The aortic valve is normal in structure.  Aortic valve regurgitation is not visualized. Pulmonic Valve: The pulmonic valve was normal in structure. Pulmonic valve regurgitation is not visualized. Aorta: The ascending aorta was not well visualized. IAS/Shunts: No atrial level shunt detected by color flow Doppler.  LEFT VENTRICLE PLAX 2D LVIDd:         5.30 cm   Diastology LVIDs:         4.80 cm   LV e' medial:    5.60 cm/s LV PW:         1.10 cm   LV E/e' medial:  16.8 LV IVS:        1.10 cm   LV e' lateral:   10.30 cm/s LVOT diam:     2.20 cm   LV E/e' lateral: 9.1 LV SV:         59 LV SV Index:   30 LVOT Area:     3.80 cm  RIGHT VENTRICLE            IVC RV Basal diam:  4.30 cm    IVC diam: 1.70 cm RV S prime:     9.20 cm/s TAPSE (M-mode): 2.2 cm LEFT ATRIUM           Index        RIGHT ATRIUM           Index LA diam:      4.90 cm 2.48 cm/m   RA Area:     18.50 cm LA Vol  (A2C): 23.0 ml 11.63 ml/m  RA Volume:   51.30 ml  25.94 ml/m LA Vol (A4C): 44.5 ml 22.50 ml/m  AORTIC VALVE LVOT Vmax:   62.20 cm/s LVOT Vmean:  41.600 cm/s LVOT VTI:    0.156 m  AORTA Ao Root diam: 3.30 cm Ao Asc diam:  3.90 cm MITRAL VALVE MV Area (PHT): 3.78 cm    SHUNTS MV Decel Time: 201 msec    Systemic VTI:  0.16 m MV E velocity: 93.80 cm/s  Systemic Diam: 2.20 cm MV A velocity: 47.95 cm/s MV E/A ratio:  1.96 Dwayne D Callwood MD Electronically signed by Alwyn Pea MD Signature Date/Time: 03/28/2023/12:41:21 PM    Final    CT HEAD WO CONTRAST ( ) Result Date: 03/28/2023 CLINICAL DATA:  Neuro deficit, acute, stroke suspected Headache, neuro deficit Headache, new onset (Age >= 51y) EXAM: CT HEAD WITHOUT CONTRAST TECHNIQUE: Contiguous axial images were obtained from the base of the skull through the vertex without intravenous contrast. RADIATION DOSE REDUCTION: This exam was performed according to the departmental dose-optimization program which includes automated exposure control, adjustment of the mA and/or kV according to patient size and/or use of iterative reconstruction technique. COMPARISON:  MRI head and CT head Jun 04, 2021. FINDINGS: Brain: No evidence of acute infarction, hemorrhage, hydrocephalus, extra-axial collection or mass lesion/mass effect. Vascular: No hyperdense vessel. Skull: No acute fracture. Sinuses/Orbits: Clear sinuses.  No acute orbital findings. Other: No mastoid effusions. IMPRESSION: No evidence of acute intracranial abnormality. Electronically Signed   By: Feliberto Harts M.D.   On: 03/28/2023 03:32   Portable Chest 1 View Result Date: 03/27/2023 CLINICAL DATA:  Dizziness with chest pain, initial encounter EXAM: PORTABLE CHEST 1 VIEW COMPARISON:  12/08/2022 FINDINGS: Postsurgical changes are again noted. Cardiac shadow is mildly prominent but accentuated by the portable technique. Lungs are well aerated bilaterally. No focal infiltrate or effusion is seen. No bony  abnormality is noted. Mild interstitial changes are again seen. IMPRESSION: No active disease. Electronically Signed  By: Alcide Clever M.D.   On: 03/27/2023 22:26    ECHO as above  TELEMETRY reviewed by me 03/29/2023: Sinus rhythm rate 50-60s  EKG reviewed by me: Sinus bradycardia with first-degree AV block rate 50 bpm  Data reviewed by me 03/29/2023: last 24h vitals tele labs imaging I/O hospitalist progress note, neurology note  Principal Problem:   Symptomatic sinus bradycardia Active Problems:   Hyperlipidemia   History of smoking   Cardiac angina (HCC)   S/P drug eluting coronary stent placement   Essential hypertension   PAF (paroxysmal atrial fibrillation) (HCC)   Chronic anticoagulation   Coronary artery disease involving coronary bypass graft of native heart with angina pectoris with documented spasm (HCC)   Anxiety   Hyperlipemia   Ischemic cardiomyopathy   Chronic Chest Pain   Hx of CABG   Iron deficiency   Protein S deficiency (HCC)   CAD (coronary artery disease)   Near syncope   Headache with neurologic deficit    ASSESSMENT AND PLAN:  Scott [name] is a 83 y.o. male  with a past medical history of coronary artery disease s/p CABG, hypertension, hyperlipidemia, ischemic cardiomyopathy, paroxysmal atrial fibrillation, history of pulmonary embolism, sinus bradycardia who presented to the ED on 03/27/2023 for dizziness and presyncope. Cardiology was consulted for further evaluation.   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  Noted to be bradycardic in the 40-50s on telemetry.  No evidence of any high-grade AV block. Echo this admission with EF 40-45%, grade III diastolic dysfunction.  -Avoid AV nodal blockers, advised patient to not resume metoprolol on discharge. -No  indication for permanent pacemaker implantation at this time. -Continue Eliquis 2.5 mg twice daily for stroke risk reduction. -Holter monitor on discharge for further evaluation. -Further workup of dizziness per primary, neurology.   # Coronary artery disease s/p CABG # Ischemic cardiomyopathy Patient with history of CAD and ischemic cardiomyopathy presented for dizziness as above.  Troponins normal x 2 at 13, 14.  EKG without acute ischemic changes. -Continue atorvastatin 80 mg daily, Zetia 10 mg daily. -See plan for Eliquis as above, patient has been on monotherapy.  Stable for discharge from cardiac perspective however patient is pending further neurologic workup.  This patient's plan of care was discussed and created with Dr. Juliann Pares and he is in agreement.  Signed: Gale Journey, PA-C  03/29/2023, 10:44 AM [facility] Cardiology

## 2023-03-29 NOTE — Progress Notes (Addendum)
 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 ... has had prior episode of near syncope, dizziness before however they would normally resolve after a few minutes or seconds. However today the symptoms continued. In addition he endorses a new headache that he has never experienced before. ..." See H&P for full HPI on admission & ED course.  Pt was admitted for further evaluation and management. Further hospital course and management as outlined below.   Consults: Cardiology Neurology   Assessment and Plan:  Symptomatic sinus bradycardia Hx of bradycardia Pre-syncope / Dizziness Paroxysmal A-fib Chronic combined systolic/diastolic CHF ECHO - EF 40-45%, grade III DD, mild MR --Cardiology consulted - have signed off on d/c from cardiac perspective --Hold metoprolol --Avoid AV nodal blockers - stop metoprolol on d/c --Resume Imdur, Ranexa --Echo pending - follow up results --Telemetry --Ambulatory monitor has been placed per cardiology --Indication for permanent pacemaker --Continue Eliquis  Headache with neurologic deficit Involuntary Myoclonic Jerking Movements / Ataxia Acute ambulatory dysfunction due to above History of dizziness - current episode not resolving as typical episodes will, per pt.   Also reported noticing difficulty gathering his thoughts, gathering his words on day of admission. CT head wo contrast was negative MRI brain negative for stroke. TSH normal B12 is low - started on  supplementation PLAN: --Neurology consulted -- appreciate input --Check Orthostatic vitals --EEG pending --MRI C-spine pending --U/S carotid doppler pending --PT/OT evaluations - recommending Acute Inpatient Rehab --TOC consulted  --Check ammonia level  Vitamin B12 deficiency - vit B12 level 132 --Vitamin B12 1000 mcg  IM ordered   Essential hypertension --Holding Imdur metoprolol  --BP's are labile, intermittently soft - continue holding --Discontinue metoprolol on d/c  Cardiac angina  --Resume Ranexa and Imdur  --SL nitroglycerin PRN chest pain  Cervical spondylosis - noted on MRI brain, incompletely assessed --MRI cervical spine - pending --PT/OT    Subjective: Pt awake sitting up in recliner, wife at bedside when seen this AM. He reports still feeling dizzy.  Denies room spinning or visual changes like objects shifting.  Does report difficulty with his walking and equilibrium but states due to sudden jerking movements that are new.  Denies headache currently.   No chest pain.   Physical Exam: Vitals:   03/29/23 0500 03/29/23 0745 03/29/23 0955 03/29/23 1250  BP:  127/72 (!) 140/57 121/72  Pulse:  (!) 52 (!) 58 (!) 43  Resp:    16  Temp:    98.1 F (36.7 C)  TempSrc:      SpO2:  96% 99% 95%  Weight: 78.8 kg     Height:       General exam: awake, alert, no acute distress HEENT: atraumatic, clear conjunctiva, anicteric sclera,moist mucus membranes, hearing grossly normal  Respiratory system: CTAB, no wheezes, rales or rhonchi, normal respiratory effort. Cardiovascular system: normal S1/S2, RRR, no JVD, murmurs, rubs, gallops, no pedal edema.   Gastrointestinal system: soft, NT, ND, no HSM felt, +bowel sounds. Central nervous system: A&O x 4. no gross focal neurologic deficits, normal speech Extremities: moves all, no edema, normal tone Skin: dry,  intact, normal temperature Psychiatry: normal mood, congruent affect, judgement and insight appear normal   Data  Reviewed:  Notable labs -- BMP with BUN 29, Cr 1.26, Ca 8.6 CBC unremarkable  TSH normal 3.114 Vitamin B12 deficient 142 (ref range 180-914) Troponin: 16 >> 16 today normal  MRI brain 3/5 - negative for stroke.  Mild small vessel ischemic changes, mild generalized atrophy.  Incompletely assessed cervical spondylosis at c3-c4 with mild ventral cord flattening consistent with  moderate spinal canal stenosis  Pending --  MRI C-spine EEG U/S carotids  Family Communication: wife at bedside on rounds. Daughter and son-in-law in room this AM also updated.  Disposition: Status is: Inpatient Remains inpatient appropriate because: Ongoing neurologic evaluation as above     Planned Discharge Destination: Home    Time spent: 52 minutes including time at bedside and in coordination of care with staff and consultants, review interpretation and time spent updating pt and family on diagnostic test results.   Author: Pennie Banter, DO 03/29/2023 2:38 PM  For on call review [email or web address].

## 2023-03-29 NOTE — Progress Notes (Signed)
 Heart Failure Navigator Progress Note  Assessed for Heart & Vascular TOC clinic readiness.  Patient does not meet criteria due to current [facility] patient of Dr. Darrold Junker.   Navigator will sign off at this time.  Roxy Horseman, RN, BSN [facility] Heart Failure Navigator Secure Chat Only

## 2023-03-29 NOTE — Plan of Care (Signed)
  Problem: Education: Goal: Knowledge of condition and prescribed therapy will improve Outcome: Progressing   Problem: Cardiac: Goal: Will achieve and/or maintain adequate cardiac output Outcome: Progressing   Problem: Education: Goal: Knowledge of General Education information will improve Description: Including pain rating scale, medication(s)/side effects and non-pharmacologic comfort measures Outcome: Progressing   Problem: Activity: Goal: Risk for activity intolerance will decrease Outcome: Progressing   Problem: Coping: Goal: Level of anxiety will decrease Outcome: Progressing   Problem: Elimination: Goal: Will not experience complications related to bowel motility Outcome: Progressing   Problem: Pain Managment: Goal: General experience of comfort will improve and/or be controlled Outcome: Progressing   Problem: Safety: Goal: Ability to remain free from injury will improve Outcome: Progressing

## 2023-03-29 NOTE — Progress Notes (Signed)
 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    PT Comments  Patient in bed on arrival and agreeable to PT/OT treatment session. Required fluctuating assistance from CGA-maxA+2 during ambulation with RW to prevent 2/2 intermittent high amplitude myoclonic jerking. Chair follow for safety with patient taking seated and standing rest breaks during mobility. On return to room, patient continued to experience myoclonic jerking, however this time while complaining of 5/10 chest pain and grasping at chest. RN notified. Discharge recommendations remain appropriate.    If plan is discharge home, recommend the following: Two people to help with walking and/or transfers;A lot of help with bathing/dressing/bathroom;Assistance with cooking/housework;Help with stairs or ramp for entrance;Assist for transportation   Can travel by private vehicle        Equipment Recommendations  Rolling Littie Chiem (2 wheels);BSC/3in1    Recommendations for Other Services Rehab consult     Precautions / Restrictions Precautions Precautions: Fall Recall of Precautions/Restrictions: Intact Restrictions Weight Bearing Restrictions Per Provider Order: No     Mobility  Bed Mobility Overal bed mobility: Modified Independent                  Transfers Overall transfer level: Needs assistance Equipment used: Rolling Aryeh Butterfield (2 wheels) Transfers: Sit to/from Stand Sit to Stand: Contact guard assist                Ambulation/Gait Ambulation/Gait assistance: Contact guard assist, Max assist, +2 physical assistance, +2  safety/equipment Gait Distance (Feet): 120 Feet (+100) Assistive device: Rolling Valene Villa (2 wheels) Gait Pattern/deviations: Step-through pattern, Decreased stride length Gait velocity: decreased     General Gait Details: fluctuates from CGA-maxA during ambulation 2/2 high amplitude jerking body movements. This session movements became more apparent after ~90'   Stairs             Wheelchair Mobility     Tilt Bed    Modified Rankin (Stroke Patients Only)       Balance Overall balance assessment: Needs assistance Sitting-balance support: No upper extremity supported, Feet supported Sitting balance-Leahy Scale: Fair     Standing balance support: Bilateral upper extremity supported Standing balance-Leahy Scale: Poor                              Communication Communication Communication: No apparent difficulties  Cognition Arousal: Alert Behavior During Therapy: WFL for tasks assessed/performed   PT - Cognitive impairments: No apparent impairments                         Following commands: Intact      Cueing    Exercises      General Comments        Pertinent Vitals/Pain Pain Assessment Pain Assessment: 0-10 Pain Score: 5  Pain Location: chest pain Pain Descriptors / Indicators: Discomfort, Tightness Pain Intervention(s): Limited activity within patient's tolerance, Patient requesting pain meds-RN notified    Home Living                          Prior Function  PT Goals (current goals can now be found in the care plan section) Acute Rehab PT Goals PT Goal Formulation: With patient/family Time For Goal Achievement: 04/11/23 Potential to Achieve Goals: Fair Progress towards PT goals: Progressing toward goals    Frequency    Min 3X/week      PT Plan      Co-evaluation PT/OT/SLP Co-Evaluation/Treatment: Yes Reason for Co-Treatment: For patient/therapist safety;To address functional/ADL  transfers PT goals addressed during session: Mobility/safety with mobility OT goals addressed during session: ADL's and self-care      AM-PAC PT "6 Clicks" Mobility   Outcome Measure  Help needed turning from your back to your side while in a flat bed without using bedrails?: None Help needed moving from lying on your back to sitting on the side of a flat bed without using bedrails?: None Help needed moving to and from a bed to a chair (including a wheelchair)?: A Little Help needed standing up from a chair using your arms (e.g., wheelchair or bedside chair)?: A Little Help needed to walk in hospital room?: Total Help needed climbing 3-5 steps with a railing? : Total 6 Click Score: 16    End of Session Equipment Utilized During Treatment: Gait belt Activity Tolerance: Patient tolerated treatment well Patient left: in chair;with call bell/phone within reach;with chair alarm set;with family/visitor present Nurse Communication: Mobility status PT Visit Diagnosis: Unsteadiness on feet (R26.81);Other abnormalities of gait and mobility (R26.89);Difficulty in walking, not elsewhere classified (R26.2);Muscle weakness (generalized) (M62.81);Dizziness and giddiness (R42)     Time: 3086-5784 PT Time Calculation (min) (ACUTE ONLY): 25 min  Charges:    $Therapeutic Activity: 8-22 mins PT General Charges $$ ACUTE PT VISIT: 1 Visit                     Maylon Peppers, PT, DPT Physical Therapist - [facility]  [facility]    Nikeya Maxim A Trayton Szabo 03/29/2023, 3:56 PM

## 2023-03-29 NOTE — Progress Notes (Signed)
 Patient had similar episode of jerking and striking in his chest, nitroglycerin x 1 was given for chest pain, he had relief ,  seems really anxious and worried, states he wish they could figure out what going on. States he did not sleep the night before, offered something to sleep, I informed the night provide, patient resting, will defer am glucose check to am, will notify staff

## 2023-03-29 NOTE — Procedures (Signed)
 Patient Name: KAYEN GRABEL  MRN: [id number]  Epilepsy Attending: Charlsie Quest  Referring Physician/Provider: Pennie Banter, DO  Date: 03/29/2023 Duration: 31.39 mins  Patient history: 82yo m with syncope. EEG to evaluate for seizure  Level of alertness: Awake, asleep  AEDs during EEG study: None  Technical aspects: This EEG study was done with scalp electrodes positioned according to the 10-20 International system of electrode placement. Electrical activity was reviewed with band pass filter of 1-70Hz , sensitivity of 7 uV/mm, display speed of 67mm/sec with a 60Hz  notched filter applied as appropriate. EEG data were recorded continuously and digitally stored.  Video monitoring was available and reviewed as appropriate.  Description: The posterior dominant rhythm consists of 8-9 Hz activity of moderate voltage (25-35 uV) seen predominantly in posterior head regions, symmetric and reactive to eye opening and eye closing. Sleep was characterized by vertex waves, sleep spindles (12 to 14 Hz), maximal frontocentral region.  There is an excessive amount of 15 to 18 Hz, 2-3 uV beta activity Hyperventilation and photic stimulation were not performed.     IMPRESSION: This study is within normal limits. No seizures or epileptiform discharges were seen throughout the recording.  A normal interictal EEG does not exclude the diagnosis of epilepsy.  Jaylena Holloway Annabelle Harman

## 2023-03-29 NOTE — Plan of Care (Signed)

## 2023-03-29 NOTE — Consult Note (Addendum)
 NEUROLOGY CONSULT NOTE   Date of service: March 29, 2023 Patient Name: Scott [name] MRN:  [id number] DOB:  01/19/1941 Chief Complaint: "Dizziness, jerkiness" Requesting Provider: Pennie Banter, DO  History of Present Illness  Scott [name] is a 83 y.o. male with hx of coronary artery disease, ischemic cardiomyopathy, bilateral carotid stenosis, chronic systolic heart failure, chronic dizziness, prior history of headaches with no recent history, hyperlipidemia, hypertension, PE on DOAC, presented to the emergency department for evaluation of dizziness and near syncope.  Cardiology is following him. Neurology was specifically consulted for about 1 year history of whole body jerking movements.  From the description it seems like these episodes are brought on with no forewarning, last for just a brief seconds and have no pre or post ictal phenomenology associated. He reports some chest discomfort and chest pain when he gets dizzy spells but they are not necessarily associated with the myoclonic jerking. He came in this time because of difficulty with walking and feeling of near syncope but the myoclonic jerking movements has been having for about 1 year.   ROS  Comprehensive ROS performed and pertinent positives documented in HPI  Past History   Past Medical History:  Diagnosis Date   Arthritis    right hip, since a fall   CAD (coronary artery disease)    a. 04/2012 Cath: 3VD->Med Rx;  b. 04/2013 PCI RCA (2 DES); c. 03/2014 PCI: LAD 80 (3.0x23 Xience Alpine DES); d. 06/2014 CABG x 2 ([facility]) LIMA->LAD, VG->OM; e. 12/2014 Cath ([facility]): patent grafts->Med Rx; f. 08/2015 Cath: patent grafts; g. 11/2016 Cath: LM min irregs, LAD 20p, 64m, LCX 100p/m, RCA 20ost, 10p/m/d, VG->OM1 nl, LIMA->dLAD nl; f. 07/2017 MV: No isch, EF 51%.   Cardiomyopathy, ischemic    a. 04/2012 Echo: EF 40-45%;  b. 08/2013 Echo: EF 45-50%; c. 01/2015 Echo: EF 40-45%; d. 07/2016 Echo: EF 60-65%; e. 09/2017 Echo: EF 55-60%, Gr1 DD.    Carotid arterial disease (HCC)    a. 05/2013 Carotid U/S; bilat 40-50% ICA stenosis.   Chronic Chest Pain    USES NITRO   Chronic systolic CHF (congestive heart failure) (HCC)    a. 01/2015 Echo: EF 40-45%; b. 07/2016 Echo: EF 60-65%; c. 09/2017 Echo: EF 55-60%, Gr1 DD, nl RV fxn.   Cough    Diverticulosis    Dizziness    Dyspnea    WITH EXERTION   Headache 1970's   migraine   History of blood transfusion 2016   post op   Hyperlipidemia    Hypertension    Iron deficiency anemia due to chronic blood loss 09/11/2016   Myocardial infarction [facility])    unsure of when   Pulmonary emboli (HCC) 07/2016   a. On Xarelto.   Reflux esophagitis    Sternal pain    a. 03/2016 s/p Sternal wire removal; b. 05/2016 s/p redo median sternotomy for sternal debridement and sternal plating.   Syncope 04/2018   CARDIOLOGIST WAS DR Mariah Milling AND HE WAS AWARE-NOW PT SEES PARASCHOS   Tubular adenoma of colon     Past Surgical History:  Procedure Laterality Date   CARDIAC CATHETERIZATION  05/01/2012   CARDIAC CATHETERIZATION  04/2013   [facility];x3 stent   CARDIAC CATHETERIZATION  01/11/15    [facility]   CARDIAC CATHETERIZATION N/A 09/16/2015   Procedure: LEFT HEART CATH AND CORS/GRAFTS ANGIOGRAPHY;  Surgeon: Antonieta Iba, MD;  Location: [facility] INVASIVE CV LAB;  Service: Cardiovascular;  Laterality: N/A;   CARPAL TUNNEL RELEASE  right hand   CATARACT EXTRACTION     LEFT   CATARACT EXTRACTION W/PHACO Left 09/16/2014   Procedure: CATARACT EXTRACTION PHACO AND INTRAOCULAR LENS PLACEMENT (IOC);  Surgeon: Lockie Mola, MD;  Location: [facility];  Service: Ophthalmology;  Laterality: Left;   COLONOSCOPY     COLONOSCOPY WITH PROPOFOL N/A 01/28/2016   Procedure: COLONOSCOPY WITH PROPOFOL;  Surgeon: Christena Deem, MD;  Location: [facility] ENDOSCOPY;  Service: Endoscopy;  Laterality: N/A;   COLONOSCOPY WITH PROPOFOL N/A 08/23/2021   Procedure: COLONOSCOPY WITH PROPOFOL;  Surgeon: Toney Reil, MD;  Location:  [facility] ENDOSCOPY;  Service: Gastroenterology;  Laterality: N/A;  REQUESTS 9AM OR LATER   CORONARY ANGIOPLASTY WITH STENT PLACEMENT  04/13/2014   CORONARY ARTERY BYPASS GRAFT  06-26-14   x3 bypasses   CORONARY PRESSURE/FFR WITH 3D MAPPING N/A 12/10/2018   Procedure: Mellody Dance PRESSURE WIRE/FFR STUDY;  Surgeon: Marcina Millard, MD;  Location: [facility] INVASIVE CV LAB;  Service: Cardiovascular;  Laterality: N/A;   CORONARY ULTRASOUND/IVUS N/A 12/10/2018   Procedure: Intravascular Ultrasound/IVUS;  Surgeon: Marcina Millard, MD;  Location: [facility] CV LAB;  Service: Cardiovascular;  Laterality: N/A;   CYSTOSCOPY N/A 08/14/2018   Procedure: CYSTOSCOPY;  Surgeon: Riki Altes, MD;  Location: [facility] ORS;  Service: Urology;  Laterality: N/A;   DE QUERVAIN'S RELEASE Left 08/22/2012   ESOPHAGOGASTRODUODENOSCOPY (EGD) WITH PROPOFOL N/A 04/26/2015   Procedure: ESOPHAGOGASTRODUODENOSCOPY (EGD) WITH PROPOFOL;  Surgeon: Wallace Cullens, MD;  Location: [facility] ENDOSCOPY;  Service: Gastroenterology;  Laterality: N/A;   ESOPHAGOGASTRODUODENOSCOPY (EGD) WITH PROPOFOL N/A 05/29/2017   Procedure: ESOPHAGOGASTRODUODENOSCOPY (EGD) WITH PROPOFOL;  Surgeon: Christena Deem, MD;  Location: [facility] ENDOSCOPY;  Service: Endoscopy;  Laterality: N/A;   LEFT HEART CATH AND CORONARY ANGIOGRAPHY N/A 12/04/2016   Procedure: LEFT HEART CATH AND CORS/GRAFTS  ANGIOGRAPHY;  Surgeon: Iran Ouch, MD;  Location: [facility] CV LAB;  Service: Cardiovascular;  Laterality: N/A;   LEFT HEART CATH AND CORS/GRAFTS ANGIOGRAPHY N/A 04/09/2018   Procedure: LEFT HEART CATH AND CORS/GRAFTS ANGIOGRAPHY;  Surgeon: Marcina Millard, MD;  Location: [facility] LAB;  Service: Cardiovascular;  Laterality: N/A;   LEFT HEART CATH AND CORS/GRAFTS ANGIOGRAPHY N/A 12/10/2018   Procedure: LEFT HEART CATH AND CORS/GRAFTS ANGIOGRAPHY;  Surgeon: Marcina Millard, MD;  Location: [facility];  Service: Cardiovascular;  Laterality: N/A;    LEFT HEART CATH AND CORS/GRAFTS ANGIOGRAPHY Left 05/25/2020   Procedure: LEFT HEART CATH AND CORS/GRAFTS ANGIOGRAPHY;  Surgeon: Marcina Millard, MD;  Location: [facility]VAS[facility] CV LAB;  Service: Cardiovascular;  Laterality: Left;   RIB PLATING N/A 05/25/2016   Procedure: STERNAL PLATING;  Surgeon: Loreli Slot, MD;  Location: [facility] OR;  Service: Thoracic;  Laterality: N/A;   right shoulder     STERNAL WIRES REMOVAL N/A 03/30/2016   Procedure: STERNAL WIRES REMOVAL;  Surgeon: Loreli Slot, MD;  Location: [facility] OR;  Service: Thoracic;  Laterality: N/A;   TONSILLECTOMY      Family History: Family History  Problem Relation Age of Onset   Heart attack Father 57       MI   Cancer Maternal Uncle     Social History  reports that he quit smoking about 16 years ago. His smoking use included cigarettes. He started smoking about 51 years ago. He has a 35 pack-year smoking history. He has quit using smokeless tobacco. He reports that he does not drink alcohol and does not use drugs.  Allergies  Allergen Reactions   Contrast Media [Iodinated Contrast  Media] Hives   Metrizamide Hives   Oxycodone Hcl Itching   Vicodin [Hydrocodone-Acetaminophen] Itching    Medications   Current Facility-Administered Medications:    acetaminophen (TYLENOL) tablet 650 mg, 650 mg, Oral, Q6H PRN, 650 mg at 04/16/2023 2313 **OR** acetaminophen (TYLENOL) suppository 650 mg, 650 mg, Rectal, Q6H PRN, Cox, Amy N, DO   apixaban (ELIQUIS) tablet 2.5 mg, 2.5 mg, Oral, BID, Esaw Grandchild A, DO, 2.5 mg at 03/29/23 0949   atorvastatin (LIPITOR) tablet 80 mg, 80 mg, Oral, Daily, Esaw Grandchild A, DO, 80 mg at 03/29/23 0948   ezetimibe (ZETIA) tablet 10 mg, 10 mg, Oral, QHS, Esaw Grandchild A, DO, 10 mg at 03/28/23 2119   isosorbide mononitrate (IMDUR) 24 hr tablet 60 mg, 60 mg, Oral, BID, Esaw Grandchild A, DO, 60 mg at 03/29/23 0948   nitroGLYCERIN (NITROSTAT) SL tablet 0.4 mg, 0.4 mg, Sublingual, Q5 min PRN, Cox, Amy  N, DO, 0.4 mg at 03/29/23 0925   ondansetron (ZOFRAN) tablet 4 mg, 4 mg, Oral, Q6H PRN **OR** ondansetron (ZOFRAN) injection 4 mg, 4 mg, Intravenous, Q6H PRN, Cox, Amy N, DO   predniSONE (DELTASONE) tablet 10 mg, 10 mg, Oral, Q breakfast, Cox, Amy N, DO, 10 mg at 03/29/23 0948   ranolazine (RANEXA) 12 hr tablet 1,000 mg, 1,000 mg, Oral, BID, Esaw Grandchild A, DO, 1,000 mg at 03/29/23 0948   senna-docusate (Senokot-S) tablet 1 tablet, 1 tablet, Oral, QHS PRN, Cox, Amy N, DO   sodium chloride flush (NS) 0.9 % injection 3 mL, 3 mL, Intravenous, Q12H, Cox, Amy N, DO, 3 mL at 03/29/23 0950   traZODone (DESYREL) tablet 50 mg, 50 mg, Oral, QHS PRN, Jimmye Norman, NP, 50 mg at 03/28/23 2216  Vitals   Vitals:   03/29/23 0415 03/29/23 0500 03/29/23 0745 03/29/23 0955  BP: 128/66  127/72 (!) 140/57  Pulse: (!) 53  (!) 52 (!) 58  Resp: 18     Temp: 97.9 F (36.6 C)     TempSrc: Oral     SpO2: 97%  96%   Weight:  78.8 kg    Height:        Body mass index is 24.93 kg/m.  Physical Exam  General: Awake alert in no distress HEENT: Normocephalic atraumatic Lungs: Clear Cardiovascular: Regular rate rhythm Neurological exam Awake alert oriented x 3 No dysarthria No aphasia Cranial nerves II to XII intact Motor examination with equal and symmetric strength in all 4 extremities Sensation intact to light touch Coordination exam with no dysmetria Gait testing was deferred  Labs/Imaging/Neurodiagnostic studies   CBC:  Recent Labs  Lab Apr 16, 2023 1412 03/28/23 0534  WBC 10.9* 8.7  HGB 14.4 13.3  HCT 43.2 38.4*  MCV 97.5 93.7  PLT 223 199   Basic Metabolic Panel:  Lab Results  Component Value Date   NA 138 03/28/2023   K 4.3 03/28/2023   CO2 23 03/28/2023   GLUCOSE 91 03/28/2023   BUN 29 (H) 03/28/2023   CREATININE 1.26 (H) 03/28/2023   CALCIUM 8.6 (L) 03/28/2023   GFRNONAA 58 (L) 03/28/2023   GFRAA >60 05/15/2019   Lipid Panel:  Lab Results  Component Value Date    LDLCALC 68 10/03/2017   HgbA1c:  Lab Results  Component Value Date   HGBA1C 5.9 (H) 11/07/2016   Alcohol Level     Component Value Date/Time   ETH <10 06/04/2021 1136   INR  Lab Results  Component Value Date   INR 1.2 06/04/2021  APTT  Lab Results  Component Value Date   APTT 33 06/04/2021     CT Head without contrast(Personally reviewed): No acute changes  MRI Brain(Personally reviewed): MRI brain-intermittent motion degraded.  No evidence of acute abnormality.  Mild chronic small vessel disease.  Incompletely assessed cervical spondylosis-at C3-4 a central disc protrusion contributes to apparent moderate spinal canal stenosis with mild flattening of the ventral spinal cord.  Neurodiagnostics rEEG: Ordered and pending  ASSESSMENT   Scott [name] is a 83 y.o. male \\with  above past medical history presenting for evaluation of dizziness and near syncope as well as almost 1 year worth of whole body myoclonic jerking. Low suspicion for seizures. Myoclonic jerking could be secondary to renal derangements. He is not on any medications that would cause myoclonic jerking. MRI brain is unremarkable but the upper part of the spinal cord that is visualized on the MRI brain has some concern for a central disc bulge at C3-4 level.  I think looking at his C-spine with a dedicated imaging modality would be helpful to rule out cord compression which might be contributing to gait disturbance.  Also would be helpful to look at his carotids for any evidence of significant stenosis.   RECOMMENDATIONS  Routine EEG to evaluate for any underlying epileptiform activity MRI of the C-spine without contrast Orthostatic vitals Check B12, TSH to evaluate for any contribution to a neuropathic process which might be causing sensory ataxia especially for gait disturbance. Check ammonia levels Carotid Dopplers  Will follow results with you  Will eventually need outpatient follow-up  Plan  discussed with patient and wife at bedside.  Plan discussed with daughter Waynetta Sandy over the phone.  ______________________________________________________________________    Signed, Milon Dikes, MD [facility]

## 2023-03-29 NOTE — Progress Notes (Signed)
 Occupational Therapy Treatment Patient Details Name: Scott [name] MRN: [id number] DOB: Aug 04, 1940 Today's Date: 03/29/2023   History of present illness Scott [name] is an 83 year old male with history of hyperlipidemia, hypertension, CAD, history of asymptomatic bradycardia, heart failure reduced ejection fraction with diastolic dysfunction, who presents emergency department for chief concerns of dizziness and presyncope episode.   OT comments  Scott [name] was seen for OT treatment on this date. Upon arrival to room pt in bed, agreeable to tx. Pt requires SBA don/doff B socks in sitting. MIN A + RW for toilet t/f and standing grooming/clothing mgmt. Tolerates ~100 ft x 50 ft x 50 ft ADL t/f with seated and standing rest breaks; fluctuating CGA - MAX A + RW with +2 assist to prevent falls 2/2 intermittent full body myoclonic jerking. Pt making good progress toward goals, will continue to follow POC. Discharge recommendation remains appropriate.        If plan is discharge home, recommend the following:  Two people to help with walking and/or transfers;Two people to help with bathing/dressing/bathroom;Help with stairs or ramp for entrance   Equipment Recommendations  Other (comment) (defer)    Recommendations for Other Services      Precautions / Restrictions Precautions Precautions: Fall Recall of Precautions/Restrictions: Intact Restrictions Weight Bearing Restrictions Per Provider Order: No       Mobility Bed Mobility Overal bed mobility: Modified Independent                  Transfers Overall transfer level: Needs assistance Equipment used: Rolling walker (2 wheels) Transfers: Sit to/from Stand Sit to Stand: Contact guard assist                 Balance Overall balance assessment: Needs assistance Sitting-balance support: No upper extremity supported, Feet supported Sitting balance-Leahy Scale: Fair     Standing balance support: Bilateral upper extremity  supported Standing balance-Leahy Scale: Poor                             ADL either performed or assessed with clinical judgement   ADL Overall ADL's : Needs assistance/impaired                                       General ADL Comments: SBA don/doff B socks in sitting. MIN A + RW for toilet t/f and standing grooming/clothing mgmt. Tolerates ~100 ft x 50 ft x 50 ft ADL t/f with seated and standing rest breaks; fluctuating CGA - MAX A + RW with +2 assist to prevent falls 2/2 intermittent full body myoclonic jerking.     Communication Communication Communication: No apparent difficulties   Cognition Arousal: Alert Behavior During Therapy: WFL for tasks assessed/performed Cognition: No apparent impairments                               Following commands: Intact                      Pertinent Vitals/ Pain       Pain Assessment Pain Assessment: 0-10 Pain Score: 5  Pain Location: chest pain Pain Descriptors / Indicators: Discomfort, Tightness Pain Intervention(s): Limited activity within patient's tolerance, Patient requesting pain meds-RN notified   Frequency  Min 1X/week  Progress Toward Goals  OT Goals(current goals can now be found in the care plan section)  Progress towards OT goals: Progressing toward goals  Acute Rehab OT Goals OT Goal Formulation: With patient/family Time For Goal Achievement: 04/11/23 Potential to Achieve Goals: Good ADL Goals Pt Will Perform Grooming: with modified independence;standing Pt Will Perform Lower Body Dressing: with modified independence;sit to/from stand Pt Will Transfer to Toilet: with modified independence;ambulating;regular height toilet  Plan      Co-evaluation    PT/OT/SLP Co-Evaluation/Treatment: Yes Reason for Co-Treatment: For patient/therapist safety;To address functional/ADL transfers PT goals addressed during session: Mobility/safety with mobility OT goals  addressed during session: ADL's and self-care      AM-PAC OT "6 Clicks" Daily Activity     Outcome Measure   Help from another person eating meals?: None Help from another person taking care of personal grooming?: A Little Help from another person toileting, which includes using toliet, bedpan, or urinal?: A Lot Help from another person bathing (including washing, rinsing, drying)?: A Lot Help from another person to put on and taking off regular upper body clothing?: A Little Help from another person to put on and taking off regular lower body clothing?: A Lot 6 Click Score: 16    End of Session Equipment Utilized During Treatment: Gait belt;Rolling walker (2 wheels)  OT Visit Diagnosis: Other abnormalities of gait and mobility (R26.89);Muscle weakness (generalized) (M62.81)   Activity Tolerance Patient tolerated treatment well   Patient Left in chair;with call bell/phone within reach;with chair alarm set;with family/visitor present;with nursing/sitter in room   Nurse Communication Mobility status        Time: 1610-9604 OT Time Calculation (min): 23 min  Charges: OT General Charges $OT Visit: 1 Visit OT Treatments $Self Care/Home Management : 8-22 mins  Kathie Dike, M.S. OTR/L  03/29/23, 3:48 PM  ascom [phone]

## 2023-03-30 DIAGNOSIS — R001 Bradycardia, unspecified: Secondary | ICD-10-CM | POA: Diagnosis not present

## 2023-03-30 LAB — GLUCOSE, CAPILLARY: Glucose-Capillary: 89 mg/dL (ref 70–99)

## 2023-03-30 MED ORDER — CLONAZEPAM 0.5 MG PO TABS
0.5000 mg | ORAL_TABLET | Freq: Two times a day (BID) | ORAL | Status: DC | PRN
  Administered 2023-03-31: 0.5 mg via ORAL
  Filled 2023-03-30 (×3): qty 1

## 2023-03-30 MED ORDER — SIMETHICONE 80 MG PO CHEW
160.0000 mg | CHEWABLE_TABLET | Freq: Once | ORAL | Status: AC
  Administered 2023-03-30: 160 mg via ORAL
  Filled 2023-03-30: qty 2

## 2023-03-30 MED ORDER — ISOSORBIDE MONONITRATE ER 30 MG PO TB24
30.0000 mg | ORAL_TABLET | Freq: Two times a day (BID) | ORAL | Status: DC
  Administered 2023-03-30 – 2023-03-31 (×2): 30 mg via ORAL
  Filled 2023-03-30 (×2): qty 1

## 2023-03-30 NOTE — NC FL2 (Signed)
 [place] MEDICAID [id number] LEVEL OF CARE FORM     IDENTIFICATION  Patient Name: Scott [name] Birthdate: 07-Sep-1940 Sex: male Admission Date (Current Location): 03/27/2023  [place] and [place] Number:  Chiropodist and Address:  [facility], [address] [place], [place] [address]      Provider Number: [id number]  Attending Physician Name and Address:  Pennie Banter, DO  Relative Name and Phone Number:  Navdeep, Halt (Spouse)  [phone] (Mobile)    Current Level of Care: Hospital Recommended Level of Care: Skilled Nursing Facility Prior Approval Number:    Date Approved/Denied:   PASRR Number: [id number]  Discharge Plan: SNF    Current Diagnoses: Patient Active Problem List   Diagnosis Date Noted   Symptomatic sinus bradycardia 03/27/2023   Near syncope 03/27/2023   Headache with neurologic deficit 03/27/2023   Change in bowel habits    Sick sinus syndrome (HCC) 02/09/2021   Multifocal pneumonia 03/20/2019   CAD (coronary artery disease) 03/20/2019   Acute on chronic respiratory failure with hypoxia (HCC)    Postural dizziness with near syncope 01/07/2019   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   Weakness 01/05/2019   Status post cardiac catheterization 12/25/2018   Dizziness 12/07/2017   Orthostatic hypotension 12/07/2017   Chest pain 10/02/2017   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   Iron deficiency 09/11/2016   Pulmonary embolus (HCC) 08/31/2016   History of pulmonary embolism 08/16/2016   Protein S deficiency (HCC) 08/16/2016   Panic disorder    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   Panic attack 08/04/2016   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   Coronary artery disease involving native coronary artery of native heart with angina pectoris with documented spasm (HCC)    Hx of CABG    Ischemic cardiomyopathy    Chronic systolic CHF (congestive heart failure) (HCC)    Chronic Chest Pain     Coronary artery disease involving coronary bypass graft of native heart with angina pectoris with documented spasm (HCC)    Angina pectoris (HCC)    Anxiety    Hyperlipemia    Chronic diastolic heart failure (HCC) 12/21/2014   Unstable angina (HCC) 12/08/2014   PAF (paroxysmal atrial fibrillation) (HCC) 12/08/2014   Chronic anticoagulation 12/08/2014   Anemia 04/20/2014   Osteoarthritis, 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   Cardiomyopathy, ischemic    Essential hypertension    Bilateral carotid artery disease (HCC)    S/P drug eluting coronary stent placement 05/30/2013   Chest pain with low risk for cardiac etiology 05/05/2013   SOB (shortness of breath) 05/05/2013   Coronary artery disease involving coronary bypass graft of native heart with angina pectoris (HCC) 05/05/2013   Hyperlipidemia 05/05/2013   History of smoking 05/05/2013   Cardiac angina (HCC) 05/05/2013    Orientation RESPIRATION BLADDER Height & Weight     Self, Time, Situation, Place  Normal Continent Weight: 78.2 kg Height:  5\' 10"  (177.8 cm)  BEHAVIORAL SYMPTOMS/MOOD NEUROLOGICAL BOWEL NUTRITION STATUS  Other (Comment) (n/a)  (n/a) Continent Diet (Heart)  AMBULATORY STATUS COMMUNICATION OF NEEDS Skin   Limited Assist Verbally Other (Comment)                       Personal Care Assistance Level of Assistance  Bathing, Dressing Bathing Assistance: Limited assistance   Dressing Assistance: Limited assistance     Functional Limitations Info  Sight Sight Info: Impaired        SPECIAL CARE FACTORS FREQUENCY  PT (By licensed PT), OT (By licensed OT)     PT Frequency: Min 2x weekly OT Frequency: Min 2x weekly            Contractures Contractures Info: Not present    Additional Factors Info  Code Status, Allergies Code Status Info: FULL Allergies Info: Contrast Media (Iodinated Contrast Media), Metrizamide, Oxycodone Hcl, Vicodin (Hydrocodone-acetaminophen)           Current Medications  (03/30/2023):  This is the current hospital active medication list Current Facility-Administered Medications  Medication Dose Route Frequency Provider Last Rate Last Admin   acetaminophen (TYLENOL) tablet 650 mg  650 mg Oral Q6H PRN Cox, Amy N, DO   650 mg at 03/27/23 2313   Or   acetaminophen (TYLENOL) suppository 650 mg  650 mg Rectal Q6H PRN Cox, Amy N, DO       apixaban (ELIQUIS) tablet 2.5 mg  2.5 mg Oral BID Esaw Grandchild A, DO   2.5 mg at 03/30/23 0843   atorvastatin (LIPITOR) tablet 80 mg  80 mg Oral Daily Esaw Grandchild A, DO   80 mg at 03/30/23 1610   clonazePAM (KLONOPIN) tablet 0.5 mg  0.5 mg Oral BID PRN Esaw Grandchild A, DO       ezetimibe (ZETIA) tablet 10 mg  10 mg Oral QHS Esaw Grandchild A, DO   10 mg at 03/29/23 2106   isosorbide mononitrate (IMDUR) 24 hr tablet 60 mg  60 mg Oral BID Esaw Grandchild A, DO   60 mg at 03/30/23 0843   nitroGLYCERIN (NITROSTAT) SL tablet 0.4 mg  0.4 mg Sublingual Q5 min PRN Cox, Amy N, DO   0.4 mg at 03/29/23 1533   ondansetron (ZOFRAN) tablet 4 mg  4 mg Oral Q6H PRN Cox, Amy N, DO       Or   ondansetron (ZOFRAN) injection 4 mg  4 mg Intravenous Q6H PRN Cox, Amy N, DO       predniSONE (DELTASONE) tablet 10 mg  10 mg Oral Q breakfast Cox, Amy N, DO   10 mg at 03/30/23 0843   ranolazine (RANEXA) 12 hr tablet 1,000 mg  1,000 mg Oral BID Esaw Grandchild A, DO   1,000 mg at 03/30/23 9604   senna-docusate (Senokot-S) tablet 1 tablet  1 tablet Oral QHS PRN Cox, Amy N, DO       sodium chloride flush (NS) 0.9 % injection 3 mL  3 mL Intravenous Q12H Cox, Amy N, DO   3 mL at 03/30/23 0845   traZODone (DESYREL) tablet 50 mg  50 mg Oral QHS PRN Jimmye Norman, NP   50 mg at 03/29/23 2111     Discharge Medications: Please see discharge summary for a list of discharge medications.  Relevant Imaging Results:  Relevant Lab Results:   Additional Information SSN# [id number]  Truddie Hidden, RN

## 2023-03-30 NOTE — Progress Notes (Signed)
 Inpatient Rehab Admissions Coordinator:   Screened for candidacy for CIR by Estill Dooms, PT, DPT.  Workup negative for acute explanation of symptoms.  He does not have the medical necessity to support a CIR admission or to obtain insurance approval.  We will not pursue a consult at this time.   Estill Dooms, PT, DPT Admissions Coordinator [phone] 03/30/23  8:50 AM

## 2023-03-30 NOTE — Progress Notes (Signed)
 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   OT comments  Scott [name] was seen for OT treatment on this date. Upon arrival to room pt seated in chair, agreeable to tx. Pt requires MIN A + RW for ADL t/f ~100 ft, assist for balance deficits. MOD I don B shoes in sitting. Endorses mild dizziness in standing. Pt making good progress toward goals, will continue to follow POC. Discharge recommendation remains appropriate.   Orthostatic Vitals  Sitting: BP 97/59,  HR 61 Standing: BP 113/56,  HR 66 3 min Standing: BP 96/53, HR 67 - mild dizziness reported      If plan is discharge home, recommend the following:  A lot of help with walking and/or transfers;A lot of help with bathing/dressing/bathroom;Help with stairs or ramp for entrance   Equipment Recommendations  Other (comment) (RW)    Recommendations for Other Services      Precautions / Restrictions Precautions Precautions: Fall Recall of Precautions/Restrictions: Intact Restrictions Weight Bearing Restrictions Per Provider Order: No       Mobility Bed Mobility               General bed mobility comments: not tested    Transfers Overall transfer level: Needs assistance Equipment used: Rolling walker (2 wheels) Transfers: Sit to/from Stand Sit to Stand: Contact guard assist                 Balance Overall balance assessment: Needs assistance Sitting-balance support: No upper extremity supported, Feet supported Sitting balance-Leahy Scale: Fair     Standing balance support: No upper extremity supported, During functional  activity Standing balance-Leahy Scale: Poor                             ADL either performed or assessed with clinical judgement   ADL Overall ADL's : Needs assistance/impaired                                       General ADL Comments: MIN A + RW for ADL t/f ~100 ft, assist for balance deficits. MOD I don B socks in sitting     Communication Communication Communication: No apparent difficulties   Cognition Arousal: Alert Behavior During Therapy: WFL for tasks assessed/performed Cognition: No apparent impairments                               Following commands: Intact        Cueing      Exercises      Shoulder Instructions       General Comments      Pertinent Vitals/ Pain       Pain Assessment Pain Assessment: No/denies pain   Frequency  Min 1X/week        Progress Toward Goals  OT Goals(current goals can now be found in the care plan section)  Progress towards OT goals: Progressing toward goals  Acute Rehab OT Goals OT Goal Formulation: With patient/family Time For Goal Achievement: 04/11/23 Potential to Achieve Goals:  Good ADL Goals Pt Will Perform Grooming: with modified independence;standing Pt Will Perform Lower Body Dressing: with modified independence;sit to/from stand Pt Will Transfer to Toilet: with modified independence;ambulating;regular height toilet  Plan      Co-evaluation        PT goals addressed during session: Mobility/safety with mobility;Balance;Proper use of DME;Strengthening/ROM        AM-PAC OT "6 Clicks" Daily Activity     Outcome Measure   Help from another person eating meals?: None Help from another person taking care of personal grooming?: A Little Help from another person toileting, which includes using toliet, bedpan, or urinal?: A Little Help from another person bathing (including washing, rinsing, drying)?: A Little Help from another person to put on and taking  off regular upper body clothing?: A Little Help from another person to put on and taking off regular lower body clothing?: A Little 6 Click Score: 19    End of Session Equipment Utilized During Treatment: Rolling walker (2 wheels);Gait belt  OT Visit Diagnosis: Other abnormalities of gait and mobility (R26.89);Muscle weakness (generalized) (M62.81)   Activity Tolerance Patient tolerated treatment well   Patient Left in chair;with call bell/phone within reach;with chair alarm set;with family/visitor present   Nurse Communication          Time: 1131-1150 OT Time Calculation (min): 19 min  Charges: OT General Charges $OT Visit: 1 Visit OT Treatments $Therapeutic Activity: 8-22 mins  Scott [name], M.S. OTR/L  03/30/23, 1:38 PM  ascom [phone]

## 2023-03-30 NOTE — Progress Notes (Signed)
 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    PT Comments  Pt was long sitting in bed upon arrival. He is A and O with supportive daughter/spouse present. Discussed at length pt's CIR denial and discuss post acute DC disposition. Pt was able to exit bed without physical assistance however upon sitting up, does have quick/ involuntary jerky/seizure like movements that resolves quickly. No change in cognition. After it passed, pt endorses feeling completely fine and was able to stand and ambulate ~ 120 ft with RW prior to requiring max assist to sit in recliner to prevent fall from a 2nd episode. Dc recs updated to reflect pt's abilities and post CIR denial. Acute PT will continue to follow and progress per current POC.    If plan is discharge home, recommend the following: Two people to help with walking and/or transfers;A lot of help with bathing/dressing/bathroom;Assistance with cooking/housework;Help with stairs or ramp for entrance;Assist for transportation     Equipment Recommendations  Rolling walker (2 wheels);BSC/3in1       Precautions / Restrictions Precautions Precautions: Fall Recall of Precautions/Restrictions: Intact Restrictions Weight Bearing Restrictions Per Provider Order: No     Mobility  Bed Mobility Overal bed mobility: Modified Independent   Transfers Overall transfer level: Needs assistance Equipment used: Rolling walker (2 wheels) Transfers: Sit to/from Stand Sit to Stand: Contact guard assist  General transfer comment: pt still having short/quick/ unexplained jerky/ seizure like  episodes that come and go inconsistently. These episodes place pt at high likihood of falls. Family (daughter/ spouse) present and provided chair follow +2 assist for additional safety    Ambulation/Gait Ambulation/Gait assistance: Contact guard assist, Max assist, +2 safety/equipment Gait Distance (Feet): 120 Feet Assistive device: Rolling walker (2 wheels) Gait Pattern/deviations: Step-through pattern, Decreased stride length Gait velocity: WNL  General Gait Details: Pt continues to have involuntary jerky/seizure like activity. chair follor for safety. Max assist during episode to prevent fall. once episodes past, easily able to stand and ambulate with RW back to room. Contant vcs for slowing down and having less impulsivity. pt remains high fall risk    Balance Overall balance assessment: Needs assistance Sitting-balance support: No upper extremity supported, Feet supported Sitting balance-Leahy Scale: Fair     Standing balance support: Bilateral upper extremity supported, During functional activity, Reliant on assistive device for balance Standing balance-Leahy Scale: Poor       Communication Communication Communication: No apparent difficulties  Cognition Arousal: Alert Behavior During Therapy: WFL for tasks assessed/performed   PT - Cognitive impairments: No apparent impairments    Following commands: Intact                Pertinent Vitals/Pain Pain Assessment Pain Assessment: No/denies pain Pain Score: 0-No pain     PT Goals (current goals can now be found in the care plan section) Acute Rehab PT Goals Patient Stated Goal: to go home Progress towards PT goals: Progressing toward goals    Frequency    Min 3X/week       Co-evaluation     PT goals addressed during session: Mobility/safety with mobility;Balance;Proper use of DME;Strengthening/ROM        AM-PAC PT "6 Clicks" Mobility   Outcome  Measure  Help needed turning from your back to your side  while in a flat bed without using bedrails?: None Help needed moving from lying on your back to sitting on the side of a flat bed without using bedrails?: None Help needed moving to and from a bed to a chair (including a wheelchair)?: A Little Help needed standing up from a chair using your arms (e.g., wheelchair or bedside chair)?: A Little Help needed to walk in hospital room?: A Lot Help needed climbing 3-5 steps with a railing? : A Lot 6 Click Score: 18    End of Session   Activity Tolerance: Patient tolerated treatment well Patient left: in chair;with call bell/phone within reach;with chair alarm set;with family/visitor present Nurse Communication: Mobility status PT Visit Diagnosis: Unsteadiness on feet (R26.81);Other abnormalities of gait and mobility (R26.89);Difficulty in walking, not elsewhere classified (R26.2);Muscle weakness (generalized) (M62.81);Dizziness and giddiness (R42)     Time: 2703-5009 PT Time Calculation (min) (ACUTE ONLY): 34 min  Charges:    $Gait Training: 8-22 mins $Therapeutic Activity: 8-22 mins PT General Charges $$ ACUTE PT VISIT: 1 Visit                    Jetta Lout PTA 03/30/23, 12:56 PM

## 2023-03-30 NOTE — Plan of Care (Signed)
  Problem: Cardiac: Goal: Will achieve and/or maintain adequate cardiac output Outcome: Progressing   Problem: Clinical Measurements: Goal: Will remain free from infection Outcome: Progressing Goal: Diagnostic test results will improve Outcome: Progressing Goal: Respiratory complications will improve Outcome: Progressing   Problem: Activity: Goal: Risk for activity intolerance will decrease Outcome: Progressing   Problem: Clinical Measurements: Goal: Cardiovascular complication will be avoided Outcome: Not Progressing   Problem: Coping: Goal: Level of anxiety will decrease Outcome: Not Progressing

## 2023-03-30 NOTE — Progress Notes (Signed)
 Follow-up chart review note  EEG normal  MRI of the C-spine with no acute findings or clear explanation of the patient's symptoms.  Multilevel cervical spondylosis compared with remote MRI from 2011, there is resulting multilevel spinal stenosis without significant cord deformity or abnormal cord signal.  TSH 3.1, B12 severely reduced at 142.  Ammonia normal at 31  Carotid Dopplers with no significant stenosis  Recommendations: Replenish B12 parenterally for a goal B12 level of greater than 400. May need weekly shots with the primary care. Follow-up with outpatient primary care and outpatient neurologist in the next 2 to 4 weeks. No clear explanation of the myoclonic jerking but uremia and CKD may be the only plausible explanation at this time. Plan was relayed to Dr. Denton Lank   -- Milon Dikes, MD Neurologist [facility]

## 2023-03-30 NOTE — Plan of Care (Signed)

## 2023-03-30 NOTE — Progress Notes (Addendum)
 Patient ID: Scott [name], male   DOB: [birth year], 83 y.o.   MRN: [id number] [facility] Cardiology    SUBJECTIVE: Patient states that he is still concerned about lightheaded dizziness jerking near syncope not much in the way of chest pain or worsening shortness of breath still has an occasional cough.  He feels much improved now but is still frustrated about persistent recurrent symptoms without causation or remedies to address them.  States they have had longstanding bradycardia but rates are usually around 50.   Vitals:   03/29/23 2104 03/30/23 0138 03/30/23 0508 03/30/23 0832  BP: 110/64 104/63 121/65 107/62  Pulse: (!) 53 (!) 50 (!) 51 (!) 55  Resp: 18 12 14 17   Temp: (!) 97.4 F (36.3 C) (!) 97.4 F (36.3 C) 97.6 F (36.4 C) 97.6 F (36.4 C)  TempSrc: Oral Oral Oral   SpO2: 99% 95% 96% 96%  Weight:   78.2 kg   Height:         Intake/Output Summary (Last 24 hours) at 03/30/2023 0948 Last data filed at 03/30/2023 0500 Gross per 24 hour  Intake 360 ml  Output 0 ml  Net 360 ml      PHYSICAL EXAM  General: Well developed, well nourished, in no acute distress HEENT:  Normocephalic and atramatic Neck:  No JVD.  Lungs: Clear bilaterally to auscultation and percussion. Heart: Bradycardia. Normal S1 and S2 without gallops or murmurs.  Abdomen: Bowel sounds are positive, abdomen soft and non-tender  Msk:  Back normal, normal gait. Normal strength and tone for age. Extremities: No clubbing, cyanosis or edema.   Neuro: Alert and oriented X 3. Psych:  Good affect, responds appropriately   LABS: Basic Metabolic Panel: Recent Labs    03/27/23 1412 03/28/23 0534  NA 136 138  K 5.0 4.3  CL 103 109  CO2 23 23  GLUCOSE 128* 91  BUN 30* 29*  CREATININE 1.42* 1.26*  CALCIUM 9.1 8.6*   Liver Function Tests: Recent Labs    03/27/23 1412  AST 21  ALT 17  ALKPHOS 44  BILITOT 0.9  PROT 7.5  ALBUMIN 3.7   No results for input(s): "LIPASE", "AMYLASE" in the last 72  hours. CBC: Recent Labs    03/27/23 1412 03/28/23 0534  WBC 10.9* 8.7  HGB 14.4 13.3  HCT 43.2 38.4*  MCV 97.5 93.7  PLT 223 199   Cardiac Enzymes: No results for input(s): "CKTOTAL", "CKMB", "CKMBINDEX", "TROPONINI" in the last 72 hours. BNP: Invalid input(s): "POCBNP" D-Dimer: No results for input(s): "DDIMER" in the last 72 hours. Hemoglobin A1C: No results for input(s): "HGBA1C" in the last 72 hours. Fasting Lipid Panel: No results for input(s): "CHOL", "HDL", "LDLCALC", "TRIG", "CHOLHDL", "LDLDIRECT" in the last 72 hours. Thyroid Function Tests: Recent Labs    03/29/23 0530  TSH 3.114   Anemia Panel: Recent Labs    03/29/23 0530  VITAMINB12 142*    MR CERVICAL SPINE WO CONTRAST Result Date: 03/29/2023 CLINICAL DATA:  Anterior cord syndrome.  Stroke-like symptoms. EXAM: MRI CERVICAL SPINE WITHOUT CONTRAST TECHNIQUE: Multiplanar, multisequence MR imaging of the cervical spine was performed. No intravenous contrast was administered. COMPARISON:  MRI cervical spine 12/08/2009.  Neck MRA 06/04/2021. FINDINGS: Technical note: Despite efforts by the technologist and patient, mild motion artifact is present on today's exam and could not be eliminated. This reduces exam sensitivity and specificity. Alignment: Reversal of the usual cervical lordosis with a mildly increased degenerative anterolisthesis at the C2-3 and C3-4 levels compared with  remote cervical MRI. Minimal retrolisthesis at C5-6. Vertebrae: No evidence of acute fracture or traumatic subluxation. Progressive multilevel spondylosis with endplate osteophytes and facet hypertrophy. Cord: Progressive multilevel spinal stenosis without significant cord deformity. No definite abnormal cord signal. Posterior Fossa, vertebral arteries, paraspinal tissues: Visualized portions of the posterior fossa appear unremarkable.Dominant left vertebral artery with preserved flow voids bilaterally. No significant paraspinal findings. Disc  levels: C2-3: Preserved disc height. Progressive facet hypertrophy and anterolisthesis with resulting effacement of the CSF surrounding the cord. No significant cord deformity. Mild foraminal narrowing bilaterally. C3-4: Progressive loss of disc height with disc bulging, uncinate spurring and asymmetric facet hypertrophy on the left. Effacement of the CSF surrounding the cord without cord deformity. Moderate to severe foraminal narrowing bilaterally. C4-5: Progressive spondylosis with loss of disc height and posterior osteophytes covering diffusely bulging disc material. Mild spinal stenosis without cord deformity. Chronic mild right and moderate left foraminal narrowing, similar to previous MRI. C5-6: Chronic spondylosis with loss of disc height and posterior osteophytes covering diffusely bulging disc material. Similar chronic effacement of the CSF surrounding the cord, asymmetric to the right. No significant cord deformity. Severe right and moderate left foraminal narrowing appears progressive. C6-7: Chronic spondylosis with loss of disc height and posterior osteophytes covering diffusely bulging disc material. Mild spinal stenosis without cord deformity. Similar mild right and moderate left foraminal narrowing. C7-T1: Progressive loss of disc height with annular disc bulging, endplate osteophytes and bilateral facet hypertrophy, worse on the right. No significant central spinal stenosis. There is progressive foraminal narrowing, moderate on the right and mild on the left. IMPRESSION: 1. No acute findings or clear explanation for the patient's symptoms. 2. Progressive multilevel cervical spondylosis compared with remote MRI from 2011. There is resulting multilevel spinal stenosis without significant cord deformity or abnormal cord signal. 3. Multilevel foraminal narrowing, most severe bilaterally at C3-4 and on the right at C5-6. Electronically Signed   By: Carey Bullocks M.D.   On: 03/29/2023 16:55   US  Carotid Bilateral Result Date: 03/29/2023 CLINICAL DATA:  83 year old male with stroke EXAM: BILATERAL CAROTID DUPLEX ULTRASOUND TECHNIQUE: Wallace Cullens scale imaging, color Doppler and duplex ultrasound were performed of bilateral carotid and vertebral arteries in the neck. COMPARISON:  None Available. FINDINGS: Criteria: Quantification of carotid stenosis is based on velocity parameters that correlate the residual internal carotid diameter with NASCET-based stenosis levels, using the diameter of the distal internal carotid lumen as the denominator for stenosis measurement. The following velocity measurements were obtained: RIGHT ICA:  Systolic 76 cm/sec, Diastolic 17 cm/sec CCA:  67 cm/sec SYSTOLIC ICA/CCA RATIO:  1.1 ECA:  97 cm/sec LEFT ICA:  Systolic 77 cm/sec, Diastolic 11 cm/sec CCA:  103 cm/sec SYSTOLIC ICA/CCA RATIO:  0.7 ECA:  95 cm/sec Right Brachial SBP: Not acquired Left Brachial SBP: Not acquired RIGHT CAROTID ARTERY: No significant calcifications of the right common carotid artery. Intermediate waveform maintained. Heterogeneous and partially calcified plaque at the right carotid bifurcation. No significant lumen shadowing. Low resistance waveform of the right ICA. No significant tortuosity. RIGHT VERTEBRAL ARTERY: Antegrade flow with low resistance waveform. LEFT CAROTID ARTERY: No significant calcifications of the left common carotid artery. Intermediate waveform maintained. Heterogeneous and partially calcified plaque at the left carotid bifurcation. No significant lumen shadowing. Low resistance waveform of the left ICA. No significant tortuosity. LEFT VERTEBRAL ARTERY:  Antegrade flow with low resistance waveform. IMPRESSION: Color duplex indicates minimal heterogeneous and calcified plaque, with no hemodynamically significant stenosis by duplex criteria in the extracranial cerebrovascular circulation.  Signed, Yvone Neu. Miachel Roux, RPVI [facility]  Radiology Electronically Signed   By: Gilmer Mor D.O.   On: 03/29/2023 16:54   EEG adult Result Date: 03/29/2023 Charlsie Quest, MD     03/29/2023  4:22 PM Patient Name: MIKO SIRICO MRN: [id number] Epilepsy Attending: Charlsie Quest Referring Physician/Provider: Pennie Banter, DO Date: 03/29/2023 Duration: 31.39 mins Patient history: 82yo m with syncope. EEG to evaluate for seizure Level of alertness: Awake, asleep AEDs during EEG study: None Technical aspects: This EEG study was done with scalp electrodes positioned according to the 10-20 International system of electrode placement. Electrical activity was reviewed with band pass filter of 1-70Hz , sensitivity of 7 uV/mm, display speed of 30mm/sec with a 60Hz  notched filter applied as appropriate. EEG data were recorded continuously and digitally stored.  Video monitoring was available and reviewed as appropriate. Description: The posterior dominant rhythm consists of 8-9 Hz activity of moderate voltage (25-35 uV) seen predominantly in posterior head regions, symmetric and reactive to eye opening and eye closing. Sleep was characterized by vertex waves, sleep spindles (12 to 14 Hz), maximal frontocentral region.  There is an excessive amount of 15 to 18 Hz, 2-3 uV beta activity Hyperventilation and photic stimulation were not performed.   IMPRESSION: This study is within normal limits. No seizures or epileptiform discharges were seen throughout the recording. A normal interictal EEG does not exclude the diagnosis of epilepsy. Charlsie Quest   MR BRAIN WO CONTRAST Result Date: 03/28/2023 CLINICAL DATA:  Provided history: Headache, new onset. Persistent dizziness. EXAM: MRI HEAD WITHOUT CONTRAST TECHNIQUE: Multiplanar, multiecho pulse sequences of the brain and surrounding structures were obtained without intravenous contrast. COMPARISON:  Head CT 03/27/2023. FINDINGS: Intermittently motion degraded examination. Most notably, the axial T2 and axial  FLAIR sequences are moderately motion degraded. Within this limitation, findings are as follows. Brain: Mild generalized cerebral atrophy. Multifocal T2 FLAIR hyperintense signal abnormality within the cerebral white matter, nonspecific but compatible with mild chronic small vessel ischemic disease. Punctate chronic microhemorrhage within the left cerebellar hemisphere (series 10, image 14). No cortical encephalomalacia is identified. There is no acute infarct. No evidence of an intracranial mass. No extra-axial fluid collection. No midline shift. Vascular: Maintained flow voids within the proximal large arterial vessels. Skull and upper cervical spine: No focal worrisome marrow lesion. Incompletely assessed cervical spondylosis. At C3-C4, a central disc protrusion contributes to apparent moderate spinal canal stenosis (with mild flattening of the ventral spinal cord). 3 mm C2-C3 grade 1 anterolisthesis. Sinuses/Orbits: No mass or acute finding within the imaged orbits. Prior bilateral ocular lens replacement. Minimal mucosal thickening scattered within bilateral ethmoid air cells. IMPRESSION: 1. Intermittently motion degraded examination. 2. No evidence of an acute intracranial abnormality. The diffusion-weighted imaging is of good quality and there is no evidence of an acute infarct. 3. Mild chronic small vessel ischemic changes within the cerebral white matter. 4. Mild generalized cerebral atrophy. 5. Minor ethmoid sinus mucosal thickening bilaterally. 6. Incompletely assessed cervical spondylosis. At C3-C4, a central disc protrusion contributes to apparent moderate spinal canal stenosis (with mild flattening of the ventral spinal cord). Electronically Signed   By: Jackey Loge D.O.   On: 03/28/2023 14:12     Echo reduced left ventricular function EF around 40 to 45%  TELEMETRY: Sinus bradycardia rate of 57 nonspecific ST-T wave changes:  ASSESSMENT AND PLAN:  Principal Problem:   Symptomatic sinus  bradycardia Active Problems:   Hyperlipidemia  History of smoking   Cardiac angina (HCC)   S/P drug eluting coronary stent placement   Essential hypertension   PAF (paroxysmal atrial fibrillation) (HCC)   Chronic anticoagulation   Coronary artery disease involving coronary bypass graft of native heart with angina pectoris with documented spasm (HCC)   Anxiety   Hyperlipemia   Ischemic cardiomyopathy   Chronic Chest Pain   Hx of CABG   Iron deficiency   Protein S deficiency (HCC)   CAD (coronary artery disease)   Near syncope   Headache with neurologic deficit    Plan Bradycardia thought to potentially be symptomatic heart rate in the 50s or higher vertigo improved Hyperlipidemia continue Lipitor therapy for lipid management Two-vessel coronary disease including coronary bypass surgery as well as multiple stents has been on Imdur and Ranexa statin ezetimibe for anginal symptoms recommend discontinuing Ranexa for now as I am concerned he may be contributing to some of her vertigo dizziness and monoclonus Paroxysmal atrial fibrillation currently on Eliquis 2.5 twice a day for anticoagulation he has a history of hypercoagulable state Previous history of smoking reportedly since quit History of ischemic cardiomyopathy EF mildly depressed at 40 to 45% Dizziness near syncope unclear etiology after an extensive neurology evaluation is failed to identify any definitive cause for conservative methods of therapy is recommended as well as vitamin replacement Chronic recurrent cough unclear etiology consider pulmonary evaluation Consider reducing Imdur also to 60 mg daily Defer permanent pacemaker at this point no definitive evidence of pacemaker will improve symptoms Recommend referral to EP as an outpatient for second opinion  Cardiac medication changes Discontinue Ranexa Reduce Imdur to 30 mg a day Hold Lipitor for 90 days Hold Zetia for 90 days Continue Eliquis 2.5 twice a  day   Alwyn Pea, MD, 03/30/2023 9:48 AM

## 2023-03-30 NOTE — TOC Progression Note (Addendum)
 Transition of Care [facility]) - Progression Note    Patient Details  Name: Scott [name] MRN: [id number] Date of Birth: 09-14-1940  Transition of Care [facility]) CM/SW Contact  Truddie Hidden, RN Phone Number: 03/30/2023, 1:14 PM  Clinical Narrative:    Spoke with patient regarding SNF. He is agreeable to SNF and would like [address] and [facility] as his first and second choice. He was advised once bed is secured his insurance payor has to authorize his stay.   Bed search started.             Expected Discharge Plan and Services                                               Social Determinants of Health (SDOH) Interventions SDOH Screenings   Food Insecurity: No Food Insecurity (03/27/2023)  Housing: Unknown (03/27/2023)  Transportation Needs: No Transportation Needs (03/27/2023)  Utilities: Not At Risk (03/27/2023)  Social Connections: Unknown (03/27/2023)  Tobacco Use: Medium Risk (03/27/2023)    Readmission Risk Interventions     No data to display

## 2023-03-30 NOTE — Progress Notes (Addendum)
 Progress Note   Patient: Scott [name] NUU:[id number] DOB: 1940/03/26 DOA: 03/27/2023     1 DOS: the patient was seen and examined on 03/30/2023   Brief hospital course:  HPI from admission 03/27/2023: "Scott [name] is an 83 year old male with history of hyperlipidemia, hypertension, CAD, history of asymptomatic bradycardia, heart failure reduced ejection fraction with diastolic dysfunction, who presents emergency department for chief concerns of dizziness and near syncope episode after cutting down lumbar. ... has had prior episode of near syncope, dizziness before however they would normally resolve after a few minutes or seconds. However today the symptoms continued. In addition he endorses a new headache that he has never experienced before. ..." See H&P for full HPI on admission & ED course.  Pt was admitted for further evaluation and management. Further hospital course and management as outlined below.   Consults: Cardiology Neurology   Assessment and Plan:  Symptomatic sinus bradycardia Hx of bradycardia Pre-syncope / Dizziness Paroxysmal A-fib Chronic combined systolic/diastolic CHF ECHO - EF 40-45%, grade III DD, mild MR --Cardiology consulted - have signed off on d/c from cardiac perspective --Hold metoprolol --Avoid AV nodal blockers - stop metoprolol on d/c --Resume Imdur, Ranexa --Echo pending - follow up results --Telemetry --Ambulatory monitor has been placed per cardiology --Indication for permanent pacemaker --Continue Eliquis  Headache with neurologic deficit Involuntary Myoclonic Jerking Movements / Ataxia Acute ambulatory dysfunction due to above History of dizziness - current episode not resolving as typical episodes will, per pt.   Also reported noticing difficulty gathering his thoughts, gathering his words on day of admission. CT head wo contrast was negative MRI brain negative for stroke. MRI C-spine - multilevel spondylosis with spinal stenosis but  normal cord signal EEG normal U/S carotids - no significant stenosis TSH normal B12 is low - started on supplementation Ammonia and TSH normal Orthostatic BP's negative / normal PLAN: --Neurology consulted -- appreciate input --Repeat Orthostatic vitals --PT/OT evaluations - CIR recommended but does not meet medical criteria --TOC consulted for SNF placement --Outpatient Neurology followed up at [facility] requested  Vitamin B12 deficiency - vit B12 level 142 --Vitamin B12 1000 mcg  IM ordered  --Continue weekly IM B12 injections with PCP --Goal B12 level above 400 per Neurology recommendation  Essential hypertension --Holding Imdur metoprolol  --BP's are labile, intermittently soft - continue holding --Discontinue metoprolol on d/c  Cardiac angina  --Resume Ranexa and Imdur  --SL nitroglycerin PRN chest pain  Cervical spondylosis - noted on MRI brain, incompletely assessed --MRI cervical spine - pending --PT/OT    Subjective: Pt awake sitting up in recliner, wife and daughter at bedside when seen this AM.  He walked with PT, but when turning around to come back to his room, developed dizziness and had to be held up.  Jerking episodes continue to impact his ability to walk safely.  Pt is agreeable with rehab, recognizing it's what is best / necessary right now.  We discussed all test results in detail.  Pt does endorse significant past traumas over his lifetime.  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  He lost two close family members suddenly and was the person present when they dies, attempting CPR on both of them.  We discussed hopeful that B12 replacement will help, but will take time.     Physical Exam: Vitals:   03/30/23 0138 03/30/23 0508 03/30/23 0832 03/30/23 1217  BP: 104/63 121/65 107/62 (!) 94/49  Pulse: (!) 50 Marland Kitchen)  51 (!) 55 75  Resp: 12 14 17    Temp: (!) 97.4 F (36.3 C) 97.6 F (36.4 C) 97.6 F (36.4 C) 97.6 F  (36.4 C)  TempSrc: Oral Oral  Oral  SpO2: 95% 96% 96% 98%  Weight:  78.2 kg    Height:       General exam: awake, alert, no acute distress HEENT: moist mucus membranes, hearing grossly normal  Respiratory system: CTAB, no wheezes, rales or rhonchi, normal respiratory effort. Cardiovascular system: normal S1/S2, RRR, no JVD, murmurs, rubs, gallops, no pedal edema.   Gastrointestinal system: soft, NT, ND, no HSM felt, +bowel sounds. Central nervous system: A&O x 4. no gross focal neurologic deficits, normal speech Musculoskeletal: intermittent full body startle/jerking movements estimate 4-5 times during encounter Extremities: moves all, no edema, normal tone Skin: dry, intact, normal temperature Psychiatry: normal mood, congruent affect, judgement and insight appear normal   Data Reviewed:  Ammonia normal 31  Notable labs from 3/5 -- BMP with BUN 29, Cr 1.26, Ca 8.6 CBC unremarkable  TSH normal 3.114 Vitamin B12 deficient 142 (ref range 180-914) Troponin: 16 >> 16 today normal  MRI brain 3/5 - negative for stroke.  Mild small vessel ischemic changes, mild generalized atrophy.  Incompletely assessed cervical spondylosis at c3-c4 with mild ventral cord flattening consistent with  moderate spinal canal stenosis  MRI C-spine -- no cord signal abnormalities or other acute findings to explain symptoms.  Progressive multilevel spondylosis with spinal stenosis, but normal cord signal -- rules out myelopathy.  EEG - normal.  U/S carotids - no significant stenosis bilaterally   Family Communication: wife and daughter at bedside on rounds.  Other daughter not present was updated on her sister's phone.   Disposition: Status is: Inpatient Remains inpatient appropriate because: Needs SNF placement, unsafe to d/c home     Planned Discharge Destination: Home    Time spent: 42 minutes    Author: Pennie Banter, DO 03/30/2023 12:43 PM  For on call review [email or web address].

## 2023-03-31 DIAGNOSIS — R001 Bradycardia, unspecified: Secondary | ICD-10-CM | POA: Diagnosis not present

## 2023-03-31 LAB — GLUCOSE, CAPILLARY
Glucose-Capillary: 83 mg/dL (ref 70–99)
Glucose-Capillary: 112 mg/dL — ABNORMAL HIGH (ref 70–99)

## 2023-03-31 LAB — TROPONIN I (HIGH SENSITIVITY)
Troponin I (High Sensitivity): 13 ng/L (ref <18)
Troponin I (High Sensitivity): 14 ng/L (ref <18)

## 2023-03-31 MED ORDER — ISOSORBIDE MONONITRATE ER 60 MG PO TB24
60.0000 mg | ORAL_TABLET | Freq: Two times a day (BID) | ORAL | Status: DC
  Administered 2023-03-31 – 2023-04-05 (×10): 60 mg via ORAL
  Filled 2023-03-31 (×10): qty 1

## 2023-03-31 MED ORDER — MORPHINE SULFATE (PF) 2 MG/ML IV SOLN
2.0000 mg | INTRAVENOUS | Status: DC | PRN
  Administered 2023-03-31 – 2023-04-03 (×4): 2 mg via INTRAVENOUS
  Filled 2023-03-31 (×5): qty 1

## 2023-03-31 MED ORDER — MORPHINE SULFATE (PF) 2 MG/ML IV SOLN
2.0000 mg | Freq: Once | INTRAVENOUS | Status: AC
  Administered 2023-03-31: 2 mg via INTRAVENOUS

## 2023-03-31 MED ORDER — NITROGLYCERIN 2 % TD OINT
1.0000 [in_us] | TOPICAL_OINTMENT | Freq: Three times a day (TID) | TRANSDERMAL | Status: DC
  Administered 2023-03-31 – 2023-04-03 (×10): 1 [in_us] via TOPICAL
  Filled 2023-03-31 (×10): qty 1

## 2023-03-31 MED ORDER — CLONAZEPAM 1 MG PO TABS
1.0000 mg | ORAL_TABLET | Freq: Once | ORAL | Status: AC
  Administered 2023-03-31: 1 mg via ORAL
  Filled 2023-03-31: qty 1

## 2023-03-31 MED ORDER — RANOLAZINE ER 500 MG PO TB12
500.0000 mg | ORAL_TABLET | Freq: Two times a day (BID) | ORAL | Status: DC
Start: 2023-03-31 — End: 2023-04-03
  Administered 2023-03-31 – 2023-04-03 (×6): 500 mg via ORAL
  Filled 2023-03-31 (×6): qty 1

## 2023-03-31 NOTE — Progress Notes (Signed)
 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 ... has had prior episode of near syncope, dizziness before however they would normally resolve after a few minutes or seconds. However today the symptoms continued. In addition he endorses a new headache that he has never experienced before. ..." See H&P for full HPI on admission & ED course.  Pt was admitted for further evaluation and management. Further hospital course and management as outlined below.   Consults: Cardiology Neurology   Assessment and Plan:  Symptomatic sinus bradycardia Hx of bradycardia Pre-syncope / Dizziness Paroxysmal A-fib Chronic combined systolic/diastolic CHF ECHO - EF 40-45%, grade III DD, mild MR --Cardiology consulted - have signed off on d/c from cardiac perspective --Hold metoprolol --Avoid AV nodal blockers - stop metoprolol on d/c --Imdur dose was reduced --Ranexa has been discontinued (3/7 per cardiology) --Echo pending - follow up results --Telemetry --Ambulatory monitor has been placed per cardiology --Indication for permanent pacemaker --Continue Eliquis  Headache with neurologic deficit Involuntary Myoclonic Jerking Movements / Ataxia Acute ambulatory dysfunction due to above History of dizziness - current episode not resolving as typical episodes will, per pt.   Also reported noticing difficulty gathering his thoughts, gathering his words on day of admission. CT head wo contrast was negative MRI brain negative for stroke. MRI  C-spine - multilevel spondylosis with spinal stenosis but normal cord signal EEG normal U/S carotids - no significant stenosis TSH normal B12 is low - started on supplementation Ammonia and TSH normal Orthostatic BP's negative / normal PLAN: --Neurology consulted -- appreciate input --Cardiology discontinued Ranexa (side effects include "neurologic disorders" and dizziness, notably) --Orthostatic vitals have been negative twice --PT/OT evaluations - SNF recommended --TOC consulted for placement --Outpatient Neurology followed up at [facility] requested  Vitamin B12 deficiency - vit B12 level 142 --Vitamin B12 1000 mcg  IM ordered  --Continue weekly IM B12 injections with PCP --Goal B12 level above 400 per Neurology recommendation  Essential hypertension --Holding Imdur metoprolol  --BP's are labile, intermittently soft - continue holding --Discontinue metoprolol on d/c  Cardiac angina  --continue Imdur, cardiology reduced dose --Ranexa was dc'd per cardiology --SL nitroglycerin PRN chest pain  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 He reports feeling well overall.  Had some jerking episodes overnight, but not yet this morning. Discussed Dr. Juliann Pares d/c Ranexa and reviewed it's list of side effects might explain his symptoms.  He is hopeful for improvement once Ranexa out of his system so he can go home instead of rehab.     Physical Exam: Vitals:   03/31/23 0124 03/31/23 0439 03/31/23 0500 03/31/23 0811  BP: 109/66 106/76  104/67  Pulse: (!) 58 (!) 55  (!) 58  Resp: 18 18  18   Temp: (!) 97.4 F (36.3 C) (!) 97.5 F (36.4 C)  (!) 97.3 F (36.3 C)  TempSrc:    Oral  SpO2: 95% 96%  95%  Weight:   76.9 kg  Height:       General exam: awake, alert, no acute distress HEENT: moist mucus membranes, hearing grossly normal   Respiratory system: CTAB, no wheezes, rales or rhonchi, normal respiratory effort. Cardiovascular system: normal S1/S2, RRR, no pedal edema.   Gastrointestinal system: soft, NT, ND, no HSM felt, +bowel sounds. Central nervous system: A&O x 3. no gross focal neurologic deficits, normal speech Skin: dry, intact, normal temperature Psychiatry: normal mood, congruent affect, judgement and insight appear normal    Data Reviewed: No new labs today  Notable labs from 3/5 -- BMP with BUN 29, Cr 1.26, Ca 8.6 CBC unremarkable   Ammonia normal 31 TSH normal 3.114 Vitamin B12 deficient 142 (ref range 180-914) Troponin: 16 >> 16 today normal   MRI brain 3/5 - negative for stroke.  Mild small vessel ischemic changes, mild generalized atrophy.  Incompletely assessed cervical spondylosis at c3-c4 with mild ventral cord flattening consistent with  moderate spinal canal stenosis  MRI C-spine -- no cord signal abnormalities or other acute findings to explain symptoms.  Progressive multilevel spondylosis with spinal stenosis, but normal cord signal -- rules out myelopathy.  EEG - normal.  U/S carotids - no significant stenosis bilaterally   Family Communication: wife and friend at bedside on rounds   Disposition: Status is: Inpatient Remains inpatient appropriate because: Needs SNF placement, unsafe to d/c home     Planned Discharge Destination: Home    Time spent: 36 minutes    Author: Pennie Banter, DO 03/31/2023 11:37 AM  For on call review [email or web address].

## 2023-03-31 NOTE — Progress Notes (Signed)
 Patient ID: Scott [name], male   DOB: Jun 17, 1940, 83 y.o.   MRN: [id number] [facility]    SUBJECTIVE: Patient resting comfortably in bed family in the room he still has some mild dizziness mild shortness of breath somewhat improved slept well no chest pain no shortness of breath   Vitals:   03/31/23 0439 03/31/23 0500 03/31/23 0811 03/31/23 1157  BP: 106/76  104/67 120/77  Pulse: (!) 55  (!) 58 61  Resp: 18  18 18   Temp: (!) 97.5 F (36.4 C)  (!) 97.3 F (36.3 C) 98.2 F (36.8 C)  TempSrc:   Oral Oral  SpO2: 96%  95% 94%  Weight:  76.9 kg    Height:         Intake/Output Summary (Last 24 hours) at 03/31/2023 1440 Last data filed at 03/30/2023 1500 Gross per 24 hour  Intake 0 ml  Output --  Net 0 ml      PHYSICAL EXAM  General: Well developed, well nourished, in no acute distress HEENT:  Normocephalic and atramatic Neck:  No JVD.  Lungs: Clear bilaterally to auscultation and percussion. Heart: Irregular irregular. Normal S1 and S2 without gallops or murmurs.  Abdomen: Bowel sounds are positive, abdomen soft and non-tender  Msk:  Back normal, normal gait. Normal strength and tone for age. Extremities: No clubbing, cyanosis or edema.   Neuro: Alert and oriented X 3. Psych:  Good affect, responds appropriately   LABS: Basic Metabolic Panel: No results for input(s): "NA", "K", "CL", "CO2", "GLUCOSE", "BUN", "CREATININE", "CALCIUM", "MG", "PHOS" in the last 72 hours. Liver Function Tests: No results for input(s): "AST", "ALT", "ALKPHOS", "BILITOT", "PROT", "ALBUMIN" in the last 72 hours. No results for input(s): "LIPASE", "AMYLASE" in the last 72 hours. CBC: No results for input(s): "WBC", "NEUTROABS", "HGB", "HCT", "MCV", "PLT" in the last 72 hours. Cardiac Enzymes: No results for input(s): "CKTOTAL", "CKMB", "CKMBINDEX", "TROPONINI" in the last 72 hours. BNP: Invalid input(s): "POCBNP" D-Dimer: No results for input(s): "DDIMER" in the last 72 hours. Hemoglobin  A1C: No results for input(s): "HGBA1C" in the last 72 hours. Fasting Lipid Panel: No results for input(s): "CHOL", "HDL", "LDLCALC", "TRIG", "CHOLHDL", "LDLDIRECT" in the last 72 hours. Thyroid Function Tests: Recent Labs    03/29/23 0530  TSH 3.114   Anemia Panel: Recent Labs    03/29/23 0530  VITAMINB12 142*    EEG adult Result Date: 03/29/2023 Charlsie Quest, MD     03/29/2023  4:22 PM Patient Name: Scott [name] MRN: [id number] Epilepsy Attending: Charlsie Quest Referring Physician/Provider: Pennie Banter, DO Date: 03/29/2023 Duration: 31.39 mins Patient history: 82yo m with syncope. EEG to evaluate for seizure Level of alertness: Awake, asleep AEDs during EEG study: None Technical aspects: This EEG study was done with scalp electrodes positioned according to the 10-20 International system of electrode placement. Electrical activity was reviewed with band pass filter of 1-70Hz , sensitivity of 7 uV/mm, display speed of 69mm/sec with a 60Hz  notched filter applied as appropriate. EEG data were recorded continuously and digitally stored.  Video monitoring was available and reviewed as appropriate. Description: The posterior dominant rhythm consists of 8-9 Hz activity of moderate voltage (25-35 uV) seen predominantly in posterior head regions, symmetric and reactive to eye opening and eye closing. Sleep was characterized by vertex waves, sleep spindles (12 to 14 Hz), maximal frontocentral region.  There is an excessive amount of 15 to 18 Hz, 2-3 uV beta activity Hyperventilation and photic stimulation were not performed.  IMPRESSION: This study is within normal limits. No seizures or epileptiform discharges were seen throughout the recording. A normal interictal EEG does not exclude the diagnosis of epilepsy. Priyanka O Yadav     Echo mild reduced left ventricular function EF of 40 to 45%  TELEMETRY: Atrial fibrillation rate of 70:  ASSESSMENT AND PLAN:  Principal Problem:    Symptomatic sinus bradycardia Active Problems:   Hyperlipidemia   History of smoking   Cardiac angina (HCC)   S/P drug eluting coronary stent placement   Essential hypertension   PAF (paroxysmal atrial fibrillation) (HCC)   Chronic anticoagulation   Coronary artery disease involving coronary bypass graft of native heart with angina pectoris with documented spasm (HCC)   Anxiety   Hyperlipemia   Ischemic cardiomyopathy   Chronic Chest Pain   Hx of CABG   Iron deficiency   Protein S deficiency (HCC)   CAD (coronary artery disease)   Near syncope   Headache with neurologic deficit    Plan Presyncope dizziness unclear etiology unlikely from symptomatic sinus bradycardia would recommend medication changes to see if side effects of contributed to his symptoms Consider permanent pacemaker for bradycardia as an outpatient if medication adjustments do not improve Continue to hold beta-blockers for now and avoid all AV nodal blocking drugs Discontinue Ranexa as it may be contributing to lightheaded dizziness and vertigo Will hold statins and Zetia for now for no more than 90 days to see if symptoms improve Reduce Imdur to 30 mg daily from 60 twice a day to see if symptoms improve Agree with Eliquis therapy for paroxysmal atrial fibrillation no indication for ablation at this point Monoclonas followed up by neurology will discontinue Ranexa as there is a potential side effect Increase activity including physical therapy to help with gait strength training balance Have the patient follow-up with cardiology as an outpatient for further evaluation and management.   Alwyn Pea, MD 03/31/2023 2:40 PM

## 2023-03-31 NOTE — Progress Notes (Signed)
       CROSS COVER NOTE  NAME: Scott [name] MRN: [id number] DOB : 1940/02/22 ATTENDING PHYSICIAN: Pennie Banter, DO    Date of Service   03/31/2023   HPI/Events of Note   Patient again reported chest pain. Nurse reports 2 brief episodes where heart rate dropped into 30. No other symptoms except chest pain induced anxiety.  Interventions   Assessment/Plan: Talked with patient in length at bedside . He described to me some episodes that have occurred at home of dizziness and weakness sounded more like a syncopal event    03/31/2023    8:54 PM 03/31/2023    7:56 PM 03/31/2023    5:05 PM  Vitals with BMI  Systolic 127 118 409  Diastolic 63 83 87  Pulse 62 65    EKG reviewed by me. SR 1st degree AVB PVC no ischemic changes  Additional klonopin for anxiety if needed - may need to continue this or other antianxiety med at discharge (consider lexapro or cymbalta) Additional morphine dose         Donnie Mesa NP [facility] Cross Cover 7pm-7am - check amion for availability Pager [phone]

## 2023-03-31 NOTE — Plan of Care (Signed)

## 2023-04-01 DIAGNOSIS — R001 Bradycardia, unspecified: Secondary | ICD-10-CM

## 2023-04-01 LAB — GLUCOSE, CAPILLARY: Glucose-Capillary: 100 mg/dL — ABNORMAL HIGH (ref 70–99)

## 2023-04-01 MED ORDER — ALUM & MAG HYDROXIDE-SIMETH 200-200-20 MG/5ML PO SUSP
30.0000 mL | Freq: Once | ORAL | Status: AC
  Administered 2023-04-01: 30 mL via ORAL
  Filled 2023-04-01: qty 30

## 2023-04-01 MED ORDER — LIDOCAINE VISCOUS HCL 2 % MT SOLN
15.0000 mL | Freq: Once | OROMUCOSAL | Status: AC
  Administered 2023-04-01: 15 mL via OROMUCOSAL
  Filled 2023-04-01: qty 15

## 2023-04-01 MED ORDER — HYOSCYAMINE SULFATE 0.125 MG SL SUBL
0.2500 mg | SUBLINGUAL_TABLET | Freq: Once | SUBLINGUAL | Status: AC
  Administered 2023-04-01: 0.25 mg via SUBLINGUAL
  Filled 2023-04-01: qty 2

## 2023-04-01 NOTE — Plan of Care (Signed)
  Problem: Cardiac: Goal: Will achieve and/or maintain adequate cardiac output Outcome: Progressing   Problem: Physical Regulation: Goal: Complications related to the disease process, condition or treatment will be avoided or minimized Outcome: Progressing   Problem: Health Behavior/Discharge Planning: Goal: Ability to manage health-related needs will improve Outcome: Progressing   Problem: Activity: Goal: Risk for activity intolerance will decrease Outcome: Progressing

## 2023-04-01 NOTE — TOC Progression Note (Signed)
 Transition of Care [facility]) - Progression Note    Patient Details  Name: Scott [name] MRN: [id number] Date of Birth: 1940-09-06  [facility]) CM/SW Contact  Rodney Langton, RN Phone Number: 04/01/2023, 3:46 PM  Clinical Narrative:      Beds remain pending.      Expected Discharge Plan and Services                                               Social Determinants of Health (SDOH) Interventions SDOH Screenings   Food Insecurity: No Food Insecurity (03/27/2023)  Housing: Unknown (03/27/2023)  Transportation Needs: No Transportation Needs (03/27/2023)  Utilities: Not At Risk (03/27/2023)  Social Connections: Unknown (03/27/2023)  Tobacco Use: Medium Risk (03/27/2023)    Readmission Risk Interventions     No data to display

## 2023-04-01 NOTE — Progress Notes (Signed)
 Patient ID: Scott [name], male   DOB: 1940-06-20, 83 y.o.   MRN: [id number] [facility]    SUBJECTIVE: Patient still having recurrent symptoms of chest pain monoclonal jerks weakness fatigue lightheadedness swelling relatively poorly episodes unpredictable recurrent.  Denies any palpitations or tachycardia heart rate dropped to 30 last night while asleep   Vitals:   04/01/23 0048 04/01/23 0500 04/01/23 0522 04/01/23 0810  BP: 101/67  104/69 112/72  Pulse: 61  60 (!) 56  Resp: 18  18   Temp: 98.4 F (36.9 C)  97.7 F (36.5 C) 97.7 F (36.5 C)  TempSrc:   Oral Oral  SpO2: 97%  97% 96%  Weight:  76 kg    Height:         Intake/Output Summary (Last 24 hours) at 04/01/2023 1209 Last data filed at 04/01/2023 1100 Gross per 24 hour  Intake 480 ml  Output --  Net 480 ml      PHYSICAL EXAM  General: Well developed, well nourished, in no acute distress HEENT:  Normocephalic and atramatic Neck:  No JVD.  Lungs: Clear bilaterally to auscultation and percussion. Heart: HRRR . Normal S1 and S2 without gallops or murmurs.  Abdomen: Bowel sounds are positive, abdomen soft and non-tender  Msk:  Back normal, normal gait. Normal strength and tone for age. Extremities: No clubbing, cyanosis or edema.   Neuro: Alert and oriented X 3. Psych:  Good affect, responds appropriately   LABS: Basic Metabolic Panel: No results for input(s): "NA", "K", "CL", "CO2", "GLUCOSE", "BUN", "CREATININE", "CALCIUM", "MG", "PHOS" in the last 72 hours. Liver Function Tests: No results for input(s): "AST", "ALT", "ALKPHOS", "BILITOT", "PROT", "ALBUMIN" in the last 72 hours. No results for input(s): "LIPASE", "AMYLASE" in the last 72 hours. CBC: No results for input(s): "WBC", "NEUTROABS", "HGB", "HCT", "MCV", "PLT" in the last 72 hours. Cardiac Enzymes: No results for input(s): "CKTOTAL", "CKMB", "CKMBINDEX", "TROPONINI" in the last 72 hours. BNP: Invalid input(s): "POCBNP" D-Dimer: No results for  input(s): "DDIMER" in the last 72 hours. Hemoglobin A1C: No results for input(s): "HGBA1C" in the last 72 hours. Fasting Lipid Panel: No results for input(s): "CHOL", "HDL", "LDLCALC", "TRIG", "CHOLHDL", "LDLDIRECT" in the last 72 hours. Thyroid Function Tests: No results for input(s): "TSH", "T4TOTAL", "T3FREE", "THYROIDAB" in the last 72 hours.  Invalid input(s): "FREET3" Anemia Panel: No results for input(s): "VITAMINB12", "FOLATE", "FERRITIN", "TIBC", "IRON", "RETICCTPCT" in the last 72 hours.  No results found.   Echo mild reduced left ventricular function EF around 40 to 45%  TELEMETRY: Sinus rhythm 65:  ASSESSMENT AND PLAN:  Principal Problem:   Symptomatic sinus bradycardia Active Problems:   Hyperlipidemia   History of smoking   Cardiac angina (HCC)   S/P drug eluting coronary stent placement   Essential hypertension   PAF (paroxysmal atrial fibrillation) (HCC)   Chronic anticoagulation   Coronary artery disease involving coronary bypass graft of native heart with angina pectoris with documented spasm (HCC)   Anxiety   Hyperlipemia   Ischemic cardiomyopathy   Chronic Chest Pain   Hx of CABG   Iron deficiency   Protein S deficiency (HCC)   CAD (coronary artery disease)   Near syncope   Headache with neurologic deficit    Plan Chest pain possible angina but be current atypical features restarted on Imdur Ranexa and placed Recurrent monoclonal jerks including mid chest area Presyncope dizziness unclear etiology unlikely from symptomatic sinus bradycardia would recommend medication changes to see if side effects of contributed  to his symptoms No clear indication for permanent pacemaker for bradycardia as an outpatient if medication adjustments do not improve Continue to hold beta-blockers for now and avoid all AV nodal blocking drugs Restart Ranexa since patient complains of recent recurrent chest discomfort Will hold statins and Zetia for now for no more than  90 days to see if symptoms improve Increase Imdur back to 60 twice a day to help with anginal symptoms Agree with Eliquis therapy for paroxysmal atrial fibrillation no indication for ablation at this point Monoclonas followed up by neurology not clear if it is related to Ranexa as there is a potential side effect Increase activity including physical therapy to help with gait strength training balance Have the patient follow-up with cardiology as an outpatient for further evaluation and management.   Alwyn Pea, MD 04/01/2023 12:09 PM

## 2023-04-01 NOTE — Progress Notes (Signed)
 Progress Note   Patient: Scott [name]:[id number] DOB: [birth year] DOA: 03/27/2023     3 DOS: the patient was seen and examined on 04/01/2023   Brief hospital course:  HPI from admission 03/27/2023: "Scott [name] is an 83 year old male with history of hyperlipidemia, hypertension, CAD, history of asymptomatic bradycardia, heart failure reduced ejection fraction with diastolic dysfunction, who presents emergency department for chief concerns of dizziness and near syncope episode after cutting down lumbar. ... has had prior episode of near syncope, dizziness before however they would normally resolve after a few minutes or seconds. However today the symptoms continued. In addition he endorses a new headache that he has never experienced before. ..." See H&P for full HPI on admission & ED course.  Pt was admitted for further evaluation and management. Further hospital course and management as outlined below.   Consults: Cardiology Neurology   Assessment and Plan:  Symptomatic sinus bradycardia  Hx of bradycardia / 1st degree AV Block Frequent PVC's / Bigeminy Pre-syncope / Dizziness Paroxysmal A-fib Cardiac Angina Chronic combined systolic/diastolic CHF ECHO - EF 40-45%, grade III DD, mild MR --Cardiology consulted - have signed off on d/c from cardiac perspective --Hold metoprolol --Avoid AV nodal blockers - stop metoprolol on d/c --Imdur PTA dose resumed due to CP recurrence on lower dose 3/8 --Ranexa resumed 3/8 PM after recurrent severe prolonged CP episodes off less than 24 hrs --Telemetry --Ambulatory monitor has been placed per cardiology --Continue Eliquis  Headache with neurologic deficit Involuntary Myoclonic Jerking Movements / Ataxia Acute ambulatory dysfunction due to above History of dizziness - current episode not resolving as typical episodes will, per pt.   Also reported noticing difficulty gathering his thoughts, gathering his words on day of admission. CT  head wo contrast was negative MRI brain negative for stroke. MRI C-spine - multilevel spondylosis with spinal stenosis but normal cord signal EEG normal U/S carotids - no significant stenosis TSH normal B12 is low - started on supplementation Ammonia and TSH normal Orthostatic BP's negative / normal Attempted holding Ranexa, neuro side effects, resumed due to severe anginal symptoms PLAN: --Neurology consulted -- appreciate input --Cardiology discontinued Ranexa (side effects include "neurologic disorders" and dizziness, notably) --Orthostatic vitals have been negative twice --PT/OT evaluations - SNF recommended --TOC consulted for placement --Outpatient Neurology followed up at [facility] requested --Trial Klonopin PRN per Neurology rec --Discussed meclizine w pt, given episodes last only seconds, little role for this.  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  MRI cervical spine - no cord signal abnormality, myelopathy ruled out. --PT/OT    Subjective: Pt awake sitting up in bed visiting with wife and a friend this AM. He reports feeling well overall.  Had some jerking episodes overnight, but not yet this morning. Discussed Dr. Juliann Pares d/c Ranexa and reviewed it's list of side effects might explain his symptoms.  He is hopeful for improvement once Ranexa out of his system so he can go home instead of rehab.     Physical Exam: Vitals:   04/01/23  0048 04/01/23 0500  04/01/23 0522 04/01/23 0810  BP: 101/67  104/69 112/72  Pulse: 61  60 (!) 56  Resp: 18  18   Temp: 98.4 F (36.9 C)  97.7 F (36.5 C) 97.7 F (36.5 C)  TempSrc:   Oral Oral  SpO2: 97%  97% 96%  Weight:  76 kg    Height:       General exam: awake, alert, no acute distress HEENT: moist mucus membranes, hearing grossly normal  Respiratory system: CTAB, no wheezes, rales or rhonchi, normal respiratory effort. Cardiovascular system: normal S1/S2, RRR, no pedal edema.   Gastrointestinal system: soft, NT, ND, no HSM felt, +bowel sounds. Central nervous system: A&O x 3. no gross focal neurologic deficits, normal speech Skin: dry, intact, normal temperature Psychiatry: normal mood, congruent affect, judgement and insight appear normal    Data Reviewed: No new labs today  Notable labs from 3/5 -- BMP with BUN 29, Cr 1.26, Ca 8.6 CBC unremarkable   Ammonia normal 31 TSH normal 3.114 Vitamin B12 deficient 142 (ref range 180-914) Troponin: 16 >> 16 today normal   MRI brain 3/5 - negative for stroke.  Mild small vessel ischemic changes, mild generalized atrophy.  Incompletely assessed cervical spondylosis at c3-c4 with mild ventral cord flattening consistent with  moderate spinal canal stenosis  MRI C-spine -- no cord signal abnormalities or other acute findings to explain symptoms.  Progressive multilevel spondylosis with spinal stenosis, but normal cord signal -- rules out myelopathy.  EEG - normal.  U/S carotids - no significant stenosis bilaterally   Family Communication: wife and friend at bedside on rounds   Disposition: Status is: Inpatient Remains inpatient appropriate because: Needs SNF placement, unsafe to d/c home     Planned Discharge Destination: Home    Time spent: 36 minutes    Author: Pennie Banter, DO 04/01/2023 2:35 PM  For on call review [email or web address].

## 2023-04-01 NOTE — Progress Notes (Incomplete)
 Hospitalist Progress Update  Pt developed severe persistent anginal chest pain this afternoon. ***

## 2023-04-02 DIAGNOSIS — R001 Bradycardia, unspecified: Secondary | ICD-10-CM | POA: Diagnosis not present

## 2023-04-02 LAB — GLUCOSE, CAPILLARY: Glucose-Capillary: 97 mg/dL (ref 70–99)

## 2023-04-02 LAB — CBC
HCT: 47.2 % (ref 39.0–52.0)
Hemoglobin: 16.2 g/dL (ref 13.0–17.0)
MCH: 31.8 pg (ref 26.0–34.0)
MCHC: 34.3 g/dL (ref 30.0–36.0)
MCV: 92.5 fL (ref 80.0–100.0)
Platelets: 234 10*3/uL (ref 150–400)
RBC: 5.1 MIL/uL (ref 4.22–5.81)
RDW: 14.5 % (ref 11.5–15.5)
WBC: 9.2 10*3/uL (ref 4.0–10.5)
nRBC: 0 % (ref 0.0–0.2)

## 2023-04-02 LAB — BASIC METABOLIC PANEL
Anion gap: 7 (ref 5–15)
BUN: 25 mg/dL — ABNORMAL HIGH (ref 8–23)
CO2: 26 mmol/L (ref 22–32)
Calcium: 9.1 mg/dL (ref 8.9–10.3)
Chloride: 103 mmol/L (ref 98–111)
Creatinine, Ser: 1.09 mg/dL (ref 0.61–1.24)
GFR, Estimated: >60 mL/min (ref >60)
Glucose, Bld: 95 mg/dL (ref 70–99)
Potassium: 4.3 mmol/L (ref 3.5–5.1)
Sodium: 136 mmol/L (ref 135–145)

## 2023-04-02 LAB — D-DIMER, QUANTITATIVE: D-Dimer, Quant: <0.27 ug{FEU}/mL (ref 0.00–0.50)

## 2023-04-02 NOTE — Progress Notes (Signed)
 Scott [name] requested support for undiagnosed symptoms that he has had for some time. Chaplain offered listening presence and validated experiences. Scott [name] shared he has been in emergency care and has a constant dream of an experience with a neighbor whom he provided CPR. Chaplain offered prayer for Scott [name]. Rector shared many stories of people dying. He shared he has his affairs in order. What is important ot him is safety and his wife.     04/02/23 2100  Spiritual Encounters  Type of Visit Initial  Care provided to: Patient  Referral source Patient request  Reason for visit Grief/loss  OnCall Visit Yes

## 2023-04-02 NOTE — Progress Notes (Signed)
 Patient ID: Scott [name], male   DOB: Mar 16, 1940, 83 y.o.   MRN: [id number] [facility]    SUBJECTIVE: Patient still having recurrent symptoms of chest pain monoclonal jerks weakness fatigue lightheadedness swelling relatively poorly episodes unpredictable recurrent.  We will proceed with nuclear stress test tomorrow.   Vitals:   04/02/23 0455 04/02/23 0743 04/02/23 1029 04/02/23 1118  BP:  106/71 106/60 109/80  Pulse:  (!) 56 61 (!) 54  Resp:  18  18  Temp:  97.9 F (36.6 C)  97.7 F (36.5 C)  TempSrc:    Oral  SpO2:  100% 99% 99%  Weight: 75.7 kg     Height:         Intake/Output Summary (Last 24 hours) at 04/02/2023 1553 Last data filed at 04/02/2023 0907 Gross per 24 hour  Intake 243 ml  Output --  Net 243 ml      PHYSICAL EXAM  General: Well developed, well nourished, in no acute distress HEENT:  Normocephalic and atramatic Neck:  No JVD.  Lungs: Clear bilaterally to auscultation and percussion. Heart: HRRR . Normal S1 and S2 without gallops or murmurs.  Abdomen: Bowel sounds are positive, abdomen soft and non-tender  Msk:  Back normal, normal gait. Normal strength and tone for age. Extremities: No clubbing, cyanosis or edema.   Neuro: Alert and oriented X 3. Psych:  Good affect, responds appropriately   LABS: Basic Metabolic Panel: Recent Labs    04/02/23 0445  NA 136  K 4.3  CL 103  CO2 26  GLUCOSE 95  BUN 25*  CREATININE 1.09  CALCIUM 9.1   Liver Function Tests: No results for input(s): "AST", "ALT", "ALKPHOS", "BILITOT", "PROT", "ALBUMIN" in the last 72 hours. No results for input(s): "LIPASE", "AMYLASE" in the last 72 hours. CBC: Recent Labs    04/02/23 0446  WBC 9.2  HGB 16.2  HCT 47.2  MCV 92.5  PLT 234   Cardiac Enzymes: No results for input(s): "CKTOTAL", "CKMB", "CKMBINDEX", "TROPONINI" in the last 72 hours. BNP: Invalid input(s): "POCBNP" D-Dimer: Recent Labs    04/02/23 0446  DDIMER <0.27   Hemoglobin A1C: No results  for input(s): "HGBA1C" in the last 72 hours. Fasting Lipid Panel: No results for input(s): "CHOL", "HDL", "LDLCALC", "TRIG", "CHOLHDL", "LDLDIRECT" in the last 72 hours. Thyroid Function Tests: No results for input(s): "TSH", "T4TOTAL", "T3FREE", "THYROIDAB" in the last 72 hours.  Invalid input(s): "FREET3" Anemia Panel: No results for input(s): "VITAMINB12", "FOLATE", "FERRITIN", "TIBC", "IRON", "RETICCTPCT" in the last 72 hours.  No results found.   Echo mild reduced left ventricular function EF around 40 to 45%  TELEMETRY: Sinus rhythm 65:  ASSESSMENT AND PLAN:  Principal Problem:   Symptomatic sinus bradycardia Active Problems:   Hyperlipidemia   History of smoking   Cardiac angina (HCC)   S/P drug eluting coronary stent placement   Essential hypertension   PAF (paroxysmal atrial fibrillation) (HCC)   Chronic anticoagulation   Coronary artery disease involving coronary bypass graft of native heart with angina pectoris with documented spasm (HCC)   Anxiety   Hyperlipemia   Ischemic cardiomyopathy   Chronic Chest Pain   Hx of CABG   Iron deficiency   Protein S deficiency (HCC)   CAD (coronary artery disease)   Near syncope   Headache with neurologic deficit    Plan  Chest pain possible angina but be current atypical features restarted on Imdur Ranexa and placed  Presyncope dizziness unclear etiology unlikely from symptomatic sinus  bradycardia would recommend medication changes to see if side effects of contributed to his symptoms No clear indication for permanent pacemaker for bradycardia as an outpatient if medication adjustments do not improve Continue to hold beta-blockers for now and avoid all AV nodal blocking drugs Restart Ranexa since patient complains of recent recurrent chest discomfort Will hold statins and Zetia for now for no more than 90 days to see if symptoms improve Increase Imdur back to 60 twice a day to help with anginal symptoms Agree with  Eliquis therapy for paroxysmal atrial fibrillation no indication for ablation at this point  Monoclonas followed up by neurology not clear if it is related to Ranexa as there is a potential side effect Increase activity including physical therapy to help with gait strength training balance Have the patient follow-up with cardiology as an outpatient for further evaluation and management.  Nuclear stress test scheduled for tomorrow  Clotilde Dieter, DO 04/02/2023 3:53 PM

## 2023-04-02 NOTE — Care Management Important Message (Signed)
 Important Message  Patient Details  Name: Scott [name] MRN: [id number] Date of Birth: 08-Jan-1941   Important Message Given:  Yes - Medicare IM     Cristela Blue, CMA 04/02/2023, 12:31 PM

## 2023-04-02 NOTE — Plan of Care (Signed)
  Problem: Clinical Measurements: Goal: Ability to maintain clinical measurements within normal limits will improve Outcome: Progressing   Problem: Clinical Measurements: Goal: Cardiovascular complication will be avoided Outcome: Progressing   Problem: Activity: Goal: Risk for activity intolerance will decrease Outcome: Progressing   Problem: Safety: Goal: Ability to remain free from injury will improve Outcome: Progressing

## 2023-04-02 NOTE — TOC Progression Note (Signed)
 Transition of Care [facility]) - Progression Note    Patient Details  Name: Scott [name] MRN: [id number] Date of Birth: 08-08-1940  [facility] CM/SW Contact  Truddie Hidden, RN Phone Number: 04/02/2023, 4:20 PM  Clinical Narrative:    Spoke with patient and his daughter, He was given bed offers. Patient chose  [facility].         Expected Discharge Plan and Services                                               Social Determinants of Health (SDOH) Interventions SDOH Screenings   Food Insecurity: No Food Insecurity (03/27/2023)  Housing: Unknown (03/27/2023)  Transportation Needs: No Transportation Needs (03/27/2023)  Utilities: Not At Risk (03/27/2023)  Social Connections: Unknown (03/27/2023)  Tobacco Use: Medium Risk (03/27/2023)    Readmission Risk Interventions     No data to display

## 2023-04-02 NOTE — Plan of Care (Signed)

## 2023-04-02 NOTE — Progress Notes (Signed)
 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 ... has had prior episode of near syncope, dizziness before however they would normally resolve after a few minutes or seconds. However today the symptoms continued. In addition he endorses a new headache that he has never experienced before. ..." See H&P for full HPI on admission & ED course.  Pt was admitted for further evaluation and management. Further hospital course and management as outlined below.   Consults: Cardiology Neurology   Assessment and Plan:  Symptomatic sinus bradycardia  Hx of bradycardia / 1st degree AV Block Frequent PVC's / Bigeminy Pre-syncope / Dizziness Paroxysmal A-fib Unstable Angina -- pt has been having ischemic CP at rest since admission, persistent despite Imdur, Ranexa, SL and nitropaste.  Chronic combined systolic/diastolic CHF ECHO - EF 40-45%, grade III DD, mild MR Hx of CAD s/p CABG and prior PCI - prior cath reports reviewed including 2022 most recently. --[facility] Cardiology consulted -- following --Plan for nuc stress test tomorrow --NPO after midnight --Hold metoprolol --Avoid AV nodal blockers - stop metoprolol on d/c --Imdur PTA dose resumed due to CP recurrence on lower dose 3/8 --Ranexa resumed 3/8 PM after recurrent severe prolonged CP episodes off x less than 24 hrs --Telemetry --Ambulatory monitor has been placed per cardiology --Currently no urgent indication for permanent pacemaker --Continue Eliquis  Headache with neurologic  deficit Involuntary Myoclonic Jerking Movements / Ataxia Acute ambulatory dysfunction due to above History of dizziness - current episode not resolving as typical episodes will, per pt.   Also reported noticing difficulty gathering his thoughts, gathering his words on day of admission. CT head wo contrast was negative MRI brain negative for stroke. MRI C-spine - multilevel spondylosis with spinal stenosis but normal cord signal EEG normal U/S carotids - no significant stenosis TSH normal B12 is low - started on supplementation Ammonia and TSH normal Orthostatic BP's negative / normal Attempted holding Ranexa, neuro side effects, resumed due to severe anginal symptoms PLAN: --Neurology consulted -- appreciate input --Cardiology discontinued Ranexa (side effects include "neurologic disorders" and dizziness, notably) --Orthostatic vitals have been negative twice --PT/OT evaluations - SNF recommended --TOC consulted for placement --Outpatient Neurology followed up at [facility] requested --Trial Klonopin PRN per Neurology rec --Discussed meclizine w pt, given episodes last only seconds, little role for this.  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  MRI cervical spine - no cord signal abnormality, myelopathy ruled out. --PT/OT    Subjective: Pt awake sitting up in recliner with wife and daughter present this AM.  He continues to have bouts of chest pain.  Jerking has not  been very  active so far today.  He remains concerned about the plan and getting discharged without an answer, knowing he'll end back in the hospital soon whether sent home or to rehab.       Physical Exam: Vitals:   04/02/23 0455 04/02/23 0743 04/02/23 1029 04/02/23 1118  BP:  106/71 106/60 109/80  Pulse:  (!) 56 61 (!) 54  Resp:  18  18  Temp:  97.9 F (36.6 C)  97.7 F (36.5 C)  TempSrc:    Oral  SpO2:  100% 99% 99%  Weight: 75.7 kg     Height:       General exam: awake, alert, no acute distress HEENT: moist mucus membranes, hearing grossly normal  Respiratory system: on room air, normal respiratory effort. Cardiovascular system: RRR, no pedal edema.   Gastrointestinal system: soft, NT, ND Central nervous system: A&O x 3. no gross focal neurologic deficits, normal speech Skin: dry, intact, normal temperature Psychiatry: normal mood, congruent affect, judgement and insight appear normal    Data Reviewed:  Notable labs -- normal BMP except BUN 25.  Cr normal at 1.09   --- Prior labs & studies ---   Ammonia normal 31 TSH normal 3.114 Vitamin B12 deficient 142 (ref range 180-914) Troponin: 16 >> 16 today normal   MRI brain 3/5 - negative for stroke.  Mild small vessel ischemic changes, mild generalized atrophy.  Incompletely assessed cervical spondylosis at c3-c4 with mild ventral cord flattening consistent with  moderate spinal canal stenosis  MRI C-spine -- no cord signal abnormalities or other acute findings to explain symptoms.  Progressive multilevel spondylosis with spinal stenosis, but normal cord signal -- rules out myelopathy.  EEG - normal.  U/S carotids - no significant stenosis bilaterally   Family Communication: wife and daughter at bedside on rounds   Disposition: Status is: Inpatient Remains inpatient appropriate because:  --Ongoing evaluation with cardiology due to persistent chest pain episodes --Needs SNF placement, unsafe to d/c home      Planned Discharge Destination: Home    Time spent: 52 minutes including time at bedside and in coordination of care with staff and consultants   Author: Pennie Banter, DO 04/02/2023 1:58 PM  For on call review [email or web address].

## 2023-04-03 ENCOUNTER — Inpatient Hospital Stay

## 2023-04-03 DIAGNOSIS — R001 Bradycardia, unspecified: Secondary | ICD-10-CM | POA: Diagnosis not present

## 2023-04-03 LAB — GLUCOSE, CAPILLARY
Glucose-Capillary: 91 mg/dL (ref 70–99)
Glucose-Capillary: 102 mg/dL — ABNORMAL HIGH (ref 70–99)

## 2023-04-03 LAB — TROPONIN I (HIGH SENSITIVITY): Troponin I (High Sensitivity): 20 ng/L — ABNORMAL HIGH (ref <18)

## 2023-04-03 MED ORDER — REGADENOSON 0.4 MG/5ML IV SOLN
0.4000 mg | Freq: Once | INTRAVENOUS | Status: AC
  Administered 2023-04-03: 0.4 mg via INTRAVENOUS
  Filled 2023-04-03: qty 5

## 2023-04-03 MED ORDER — MIDODRINE HCL 5 MG PO TABS
2.5000 mg | ORAL_TABLET | Freq: Three times a day (TID) | ORAL | Status: DC
  Administered 2023-04-03 – 2023-04-04 (×4): 2.5 mg via ORAL
  Filled 2023-04-03 (×4): qty 1

## 2023-04-03 MED ORDER — CYANOCOBALAMIN 1000 MCG/ML IJ SOLN
1000.0000 ug | Freq: Once | INTRAMUSCULAR | Status: AC
  Administered 2023-04-04: 1000 ug via INTRAMUSCULAR
  Filled 2023-04-03: qty 1

## 2023-04-03 MED ORDER — RANOLAZINE ER 500 MG PO TB12
1000.0000 mg | ORAL_TABLET | Freq: Two times a day (BID) | ORAL | Status: DC
  Administered 2023-04-03 – 2023-04-05 (×4): 1000 mg via ORAL
  Filled 2023-04-03 (×4): qty 2

## 2023-04-03 MED ORDER — TECHNETIUM TC 99M TETROFOSMIN IV KIT
30.0000 | PACK | Freq: Once | INTRAVENOUS | Status: AC | PRN
  Administered 2023-04-03: 30.4 via INTRAVENOUS

## 2023-04-03 MED ORDER — TECHNETIUM TC 99M TETROFOSMIN IV KIT
10.0000 | PACK | Freq: Once | INTRAVENOUS | Status: AC | PRN
  Administered 2023-04-03: 10.78 via INTRAVENOUS

## 2023-04-03 NOTE — Progress Notes (Signed)
 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    PT Comments  Patient semi reclined in bed on arrival and agreeable to PT/OT session. Obtained orthostatics prior to mobilizing, see below. Patient wanting to mobilize if able. Able to ambulate 34' with CGA-modA and RW with +2 for chair follow and safety. Upon sitting, patient complaining of 6/10 chest pain, RN notified and cardiology present at end of session. Discharge plan remains appropriate.    Orthostatic BPs  Supine 114/63  Sitting 141/70  Standing 110/63  Standing after 3 min 99/38  Sitting after ambulation 141/69      If plan is discharge home, recommend the following: Two people to help with walking and/or transfers;A lot of help with bathing/dressing/bathroom;Assistance with cooking/housework;Help with stairs or ramp for entrance;Assist for transportation   Can travel by private vehicle        Equipment Recommendations  Rolling Cailyn Houdek (2 wheels);BSC/3in1    Recommendations for Other Services       Precautions / Restrictions Precautions Precautions: Fall Recall of Precautions/Restrictions: Intact Restrictions Weight Bearing Restrictions Per Provider Order: No     Mobility  Bed Mobility Overal bed mobility: Modified Independent                  Transfers Overall transfer level: Needs assistance Equipment used: Rolling Jinan Biggins (2 wheels) Transfers: Sit to/from Stand Sit to Stand: Contact guard assist                Ambulation/Gait Ambulation/Gait assistance: Contact guard assist, Mod assist, +2 physical assistance, +2  safety/equipment Gait Distance (Feet): 50 Feet Assistive device: Rolling Jasneet Schobert (2 wheels) Gait Pattern/deviations: Step-through pattern, Decreased stride length Gait velocity: WNL     General Gait Details: fluctuates from CGA-modA for balance. Complaining of chest pain (6/10) upon sitting   Stairs             Wheelchair Mobility     Tilt Bed    Modified Rankin (Stroke Patients Only)       Balance Overall balance assessment: Needs assistance Sitting-balance support: No upper extremity supported, Feet supported Sitting balance-Leahy Scale: Fair     Standing balance support: Bilateral upper extremity supported, During functional activity Standing balance-Leahy Scale: Poor                              Communication Communication Communication: No apparent difficulties  Cognition Arousal: Alert Behavior During Therapy: WFL for tasks assessed/performed   PT - Cognitive impairments: No apparent impairments                         Following commands: Intact      Cueing    Exercises      General Comments        Pertinent Vitals/Pain Pain Assessment Pain Assessment: 0-10 Pain Score: 7  Pain Location: chest Pain Descriptors / Indicators: Discomfort, Tightness Pain Intervention(s): Limited activity within patient's tolerance, Monitored during session, Repositioned    Home Living                          Prior  Function            PT Goals (current goals can now be found in the care plan section) Acute Rehab PT Goals Patient Stated Goal: to go home PT Goal Formulation: With patient/family Time For Goal Achievement: 04/11/23 Potential to Achieve Goals: Fair Progress towards PT goals: Progressing toward goals    Frequency    Min 3X/week      PT Plan      Co-evaluation PT/OT/SLP Co-Evaluation/Treatment: Yes Reason for Co-Treatment: For patient/therapist safety;To address functional/ADL transfers PT goals  addressed during session: Mobility/safety with mobility;Balance;Proper use of DME;Strengthening/ROM        AM-PAC PT "6 Clicks" Mobility   Outcome Measure  Help needed turning from your back to your side while in a flat bed without using bedrails?: None Help needed moving from lying on your back to sitting on the side of a flat bed without using bedrails?: None Help needed moving to and from a bed to a chair (including a wheelchair)?: A Little Help needed standing up from a chair using your arms (e.g., wheelchair or bedside chair)?: A Little Help needed to walk in hospital room?: A Lot Help needed climbing 3-5 steps with a railing? : A Lot 6 Click Score: 18    End of Session Equipment Utilized During Treatment: Gait belt Activity Tolerance: Patient tolerated treatment well Patient left: in chair;with call bell/phone within reach;with chair alarm set;with family/visitor present Nurse Communication: Mobility status PT Visit Diagnosis: Unsteadiness on feet (R26.81);Other abnormalities of gait and mobility (R26.89);Difficulty in walking, not elsewhere classified (R26.2);Muscle weakness (generalized) (M62.81);Dizziness and giddiness (R42)     Time: 7829-5621 PT Time Calculation (min) (ACUTE ONLY): 37 min  Charges:    $Therapeutic Activity: 8-22 mins PT General Charges $$ ACUTE PT VISIT: 1 Visit                     Maylon Peppers, PT, DPT Physical Therapist - [facility]  [facility]    Elmore Hyslop A Luian Schumpert 04/03/2023, 3:26 PM

## 2023-04-03 NOTE — Progress Notes (Addendum)
 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   OT comments  Chart reviewed, pt greeted in bed with family present, agreeable to OT/PT co tx targeting improving functional activity tolerance in preparation for ADL tasks. Obtained orthostatics prior to ambulating, Please see vitals below. SET UP for grooming tasks. Discussed discharge recommendations with pt/family. Pt reports feeling "weird" throughout, wanting to attempt amb with symptoms monitored- chest pain with ambulation reported and seated rest break provided immediately. Cardiology PA in room post session and notified, nurse also notified by PT.  OT will continue to follow acutely.   Supine 114/63  Sitting 141/70  Standing 110/63  Standing after 3 min 99/38  Sitting after ambulation 141/69         If plan is discharge home, recommend the following:  A lot of help with walking and/or transfers;A lot of help with bathing/dressing/bathroom;Help with stairs or ramp for entrance   Equipment Recommendations  BSC/3in1;Wheelchair (measurements OT)    Recommendations for Other Services      Precautions / Restrictions Precautions Precautions: Fall Recall of Precautions/Restrictions: Intact Restrictions Weight Bearing Restrictions Per Provider Order: No       Mobility Bed Mobility Overal bed mobility: Needs Assistance Bed Mobility: Supine to Sit     Supine to sit: Supervision, HOB elevated          Transfers Overall transfer level: Needs assistance Equipment used: Rolling walker (2 wheels) Transfers: Sit to/from Stand Sit to Stand:  Contact guard assist                 Balance Overall balance assessment: Needs assistance Sitting-balance support: No upper extremity supported, Feet supported Sitting balance-Leahy Scale: Fair     Standing balance support: Bilateral upper extremity supported, During functional activity Standing balance-Leahy Scale: Poor                             ADL either performed or assessed with clinical judgement   ADL Overall ADL's : Needs assistance/impaired Eating/Feeding: Set up;Sitting   Grooming: Set up;Sitting                               Functional mobility during ADLs: Contact guard assist;Moderate assistance with close chair follow (approx 50'- MOD A when pt becomes "jerky")      Extremity/Trunk Assessment              Vision       Perception     Praxis     Communication Communication Communication: No apparent difficulties   Cognition Arousal: Alert Behavior During Therapy: WFL for tasks assessed/performed Cognition: No apparent impairments                               Following commands: Intact        Cueing   Cueing Techniques: Verbal cues  Exercises Other Exercises Other Exercises: edu pt re: role of OT, role of rehab    Shoulder Instructions       General Comments      Pertinent Vitals/  Pain       Pain Assessment Pain Assessment: 0-10 Pain Score: 7  Pain Location: chest with mobility Pain Descriptors / Indicators: Discomfort, Tightness Pain Intervention(s): Monitored during session, Limited activity within patient's tolerance, Repositioned, Premedicated before session  Home Living                                          Prior Functioning/Environment              Frequency  Min 1X/week        Progress Toward Goals  OT Goals(current goals can now be found in the care plan section)  Progress towards OT goals: Progressing toward goals  Acute Rehab OT  Goals Time For Goal Achievement: 04/11/23  Plan      Co-evaluation      Reason for Co-Treatment: For patient/therapist safety;To address functional/ADL transfers PT goals addressed during session: Mobility/safety with mobility;Balance;Proper use of DME;Strengthening/ROM OT goals addressed during session: ADL's and self-care      AM-PAC OT "6 Clicks" Daily Activity     Outcome Measure   Help from another person eating meals?: None Help from another person taking care of personal grooming?: A Little Help from another person toileting, which includes using toliet, bedpan, or urinal?: A Little Help from another person bathing (including washing, rinsing, drying)?: A Little Help from another person to put on and taking off regular upper body clothing?: A Little Help from another person to put on and taking off regular lower body clothing?: A Little 6 Click Score: 19    End of Session Equipment Utilized During Treatment: Rolling walker (2 wheels);Gait belt  OT Visit Diagnosis: Other abnormalities of gait and mobility (R26.89);Muscle weakness (generalized) (M62.81)   Activity Tolerance Patient tolerated treatment well   Patient Left in chair;with call bell/phone within reach;with chair alarm set;with family/visitor present (with cardiology PA present)   Nurse Communication Mobility status        Time: 9147-8295 OT Time Calculation (min): 34 min  Charges: OT General Charges $OT Visit: 1 Visit OT Treatments $Therapeutic Activity: 8-22 mins  Oleta Mouse, OTD OTR/L  04/03/23, 4:33 PM

## 2023-04-03 NOTE — Plan of Care (Signed)
  Problem: Cardiac: Goal: Will achieve and/or maintain adequate cardiac output Outcome: Progressing   Problem: Physical Regulation: Goal: Complications related to the disease process, condition or treatment will be avoided or minimized Outcome: Progressing   Problem: Activity: Goal: Risk for activity intolerance will decrease Outcome: Progressing   Problem: Safety: Goal: Ability to remain free from injury will improve Outcome: Progressing

## 2023-04-03 NOTE — Progress Notes (Signed)
 [facility] CLINIC CARDIOLOGY PROGRESS NOTE       Patient ID: Scott [name] MRN: [id number] DOB/AGE: 05/27/1940 83 y.o.  Admit date: 03/27/2023 Referring Physician Dr. Londell Moh Primary Physician [name], MD  Primary Cardiologist Dr. Darrold Junker Reason for Consultation dizziness, presyncope  HPI: Scott [name] is a 83 y.o. male  with a past medical history of coronary artery disease s/p CABG, hypertension, hyperlipidemia, ischemic cardiomyopathy, paroxysmal atrial fibrillation, history of pulmonary embolism, sinus bradycardia who presented to the ED on 03/27/2023 for dizziness and presyncope. Cardiology was consulted for further evaluation.   Interval history: -Patient seen and examined this AM for stress test.  -Reports he feels better overall similar, still with occasional dizziness.  -Had episode of CP yesterday but denies any recurrence.   -HR remains in the 50-60s, no evidence of high grade AVB.  -BP stable.   Review of systems complete and found to be negative unless listed above    Past Medical History:  Diagnosis Date   Arthritis    right hip, since a fall   CAD (coronary artery disease)    a. 04/2012 Cath: 3VD->Med Rx;  b. 04/2013 PCI RCA (2 DES); c. 03/2014 PCI: LAD 80 (3.0x23 Xience Alpine DES); d. 06/2014 CABG x 2 ([facility]) LIMA->LAD, VG->OM; e. 12/2014 Cath ([facility]): patent grafts->Med Rx; f. 08/2015 Cath: patent grafts; g. 11/2016 Cath: LM min irregs, LAD 20p, 59m, LCX 100p/m, RCA 20ost, 10p/m/d, VG->OM1 nl, LIMA->dLAD nl; f. 07/2017 MV: No isch, EF 51%.   Cardiomyopathy, ischemic    a. 04/2012 Echo: EF 40-45%;  b. 08/2013 Echo: EF 45-50%; c. 01/2015 Echo: EF 40-45%; d. 07/2016 Echo: EF 60-65%; e. 09/2017 Echo: EF 55-60%, Gr1 DD.   Carotid arterial disease (HCC)    a. 05/2013 Carotid U/S; bilat 40-50% ICA stenosis.   Chronic Chest Pain    USES NITRO   Chronic systolic CHF (congestive heart failure) (HCC)    a. 01/2015 Echo: EF 40-45%; b. 07/2016 Echo: EF 60-65%; c. 09/2017 Echo: EF  55-60%, Gr1 DD, nl RV fxn.   Cough    Diverticulosis    Dizziness    Dyspnea    WITH EXERTION   Headache 1970's   migraine   History of blood transfusion 2016   post op   Hyperlipidemia    Hypertension    Iron deficiency anemia due to chronic blood loss 09/11/2016   Myocardial infarction [facility])    unsure of when   Pulmonary emboli (HCC) 07/2016   a. On Xarelto.   Reflux esophagitis    Sternal pain    a. 03/2016 s/p Sternal wire removal; b. 05/2016 s/p redo median sternotomy for sternal debridement and sternal plating.   Syncope 04/2018   CARDIOLOGIST WAS DR Mariah Milling AND HE WAS AWARE-NOW PT SEES PARASCHOS   Tubular adenoma of colon     Past Surgical History:  Procedure Laterality Date   CARDIAC CATHETERIZATION  05/01/2012   CARDIAC CATHETERIZATION  04/2013   [facility];x3 stent   CARDIAC CATHETERIZATION  01/11/15    [facility]   CARDIAC CATHETERIZATION N/A 09/16/2015   Procedure: LEFT HEART CATH AND CORS/GRAFTS ANGIOGRAPHY;  Surgeon: Antonieta Iba, MD;  Location: [facility] INVASIVE CV LAB;  Service: Cardiovascular;  Laterality: N/A;   CARPAL TUNNEL RELEASE     right hand   CATARACT EXTRACTION     LEFT   CATARACT EXTRACTION W/PHACO Left 09/16/2014   Procedure: CATARACT EXTRACTION PHACO AND INTRAOCULAR LENS PLACEMENT (IOC);  Surgeon: Lockie Mola, MD;  Location:  [facility];  Service: Ophthalmology;  Laterality: Left;   COLONOSCOPY     COLONOSCOPY WITH PROPOFOL N/A 01/28/2016   Procedure: COLONOSCOPY WITH PROPOFOL;  Surgeon: Christena Deem, MD;  Location: [facility] ENDOSCOPY;  Service: Endoscopy;  Laterality: N/A;   COLONOSCOPY WITH PROPOFOL N/A 08/23/2021   Procedure: COLONOSCOPY WITH PROPOFOL;  Surgeon: Toney Reil, MD;  Location: [facility] ENDOSCOPY;  Service: Gastroenterology;  Laterality: N/A;  REQUESTS 9AM OR LATER   CORONARY ANGIOPLASTY WITH STENT PLACEMENT  04/13/2014   CORONARY ARTERY BYPASS GRAFT  06-26-14   x3 bypasses   CORONARY PRESSURE/FFR WITH 3D MAPPING N/A 12/10/2018    Procedure: Mellody Dance PRESSURE WIRE/FFR STUDY;  Surgeon: Marcina Millard, MD;  Location: [facility] INVASIVE CV LAB;  Service: Cardiovascular;  Laterality: N/A;   CORONARY ULTRASOUND/IVUS N/A 12/10/2018   Procedure: Intravascular Ultrasound/IVUS;  Surgeon: Marcina Millard, MD;  Location: [facility] INVASIVE CV LAB;  Service: Cardiovascular;  Laterality: N/A;   CYSTOSCOPY N/A 08/14/2018   Procedure: CYSTOSCOPY;  Surgeon: Riki Altes, MD;  Location: [facility] ORS;  Service: Urology;  Laterality: N/A;   DE QUERVAIN'S RELEASE Left 08/22/2012   ESOPHAGOGASTRODUODENOSCOPY (EGD) WITH PROPOFOL N/A 04/26/2015   Procedure: ESOPHAGOGASTRODUODENOSCOPY (EGD) WITH PROPOFOL;  Surgeon: Wallace Cullens, MD;  Location: [facility] ENDOSCOPY;  Service: Gastroenterology;  Laterality: N/A;   ESOPHAGOGASTRODUODENOSCOPY (EGD) WITH PROPOFOL N/A 05/29/2017   Procedure: ESOPHAGOGASTRODUODENOSCOPY (EGD) WITH PROPOFOL;  Surgeon: Christena Deem, MD;  Location: [facility] ENDOSCOPY;  Service: Endoscopy;  Laterality: N/A;   LEFT HEART CATH AND CORONARY ANGIOGRAPHY N/A 12/04/2016   Procedure: LEFT HEART CATH AND CORS/GRAFTS  ANGIOGRAPHY;  Surgeon: Iran Ouch, MD;  Location: [facility] INVASIVE CV LAB;  Service: Cardiovascular;  Laterality: N/A;   LEFT HEART CATH AND CORS/GRAFTS ANGIOGRAPHY N/A 04/09/2018   Procedure: LEFT HEART CATH AND CORS/GRAFTS ANGIOGRAPHY;  Surgeon: Marcina Millard, MD;  Location: [facility] INVASIVE CV LAB;  Service: Cardiovascular;  Laterality: N/A;   LEFT HEART CATH AND CORS/GRAFTS ANGIOGRAPHY N/A 12/10/2018   Procedure: LEFT HEART CATH AND CORS/GRAFTS ANGIOGRAPHY;  Surgeon: Marcina Millard, MD;  Location: [facility] INVASIVE CV LAB;  Service: Cardiovascular;  Laterality: N/A;   LEFT HEART CATH AND CORS/GRAFTS ANGIOGRAPHY Left 05/25/2020   Procedure: LEFT HEART CATH AND CORS/GRAFTS ANGIOGRAPHY;  Surgeon: Marcina Millard, MD;  Location: [facility] INVASIVE CV LAB;  Service: Cardiovascular;  Laterality: Left;   RIB PLATING N/A  05/25/2016   Procedure: STERNAL PLATING;  Surgeon: Loreli Slot, MD;  Location: [facility] OR;  Service: Thoracic;  Laterality: N/A;   right shoulder     STERNAL WIRES REMOVAL N/A 03/30/2016   Procedure: STERNAL WIRES REMOVAL;  Surgeon: Loreli Slot, MD;  Location: [facility] OR;  Service: Thoracic;  Laterality: N/A;   TONSILLECTOMY      Facility-Administered Medications Prior to Admission  Medication Dose Route Frequency Provider Last Rate Last Admin   lidocaine (XYLOCAINE) 2 % jelly 1 application  1 application  Urethral Once Stoioff, Verna Czech, MD       Medications Prior to Admission  Medication Sig Dispense Refill Last Dose/Taking   [EXPIRED] azithromycin (ZITHROMAX) 250 MG tablet Take 250 mg by mouth daily.   03/27/2023 Morning   ELIQUIS 2.5 MG TABS tablet Take 2.5 mg by mouth 2 (two) times daily.   03/27/2023 Morning   ezetimibe (ZETIA) 10 MG tablet Take 1 tablet (10 mg total) by mouth daily. (Patient taking differently: Take 10 mg by mouth at bedtime.) 90 tablet 3 03/26/2023   fluticasone-salmeterol (ADVAIR) 250-50 MCG/ACT AEPB Inhale 1 puff  into the lungs in the morning and at bedtime.   03/27/2023 Morning   Ipratropium-Albuterol (COMBIVENT) 20-100 MCG/ACT AERS respimat Inhale 2 puffs into the lungs 4 (four) times daily as needed for wheezing.   03/27/2023   isosorbide mononitrate (IMDUR) 60 MG 24 hr tablet Take 60 mg by mouth 2 (two) times daily.    03/27/2023 Morning   [EXPIRED] predniSONE (DELTASONE) 10 MG tablet Take 10 mg by mouth.   03/27/2023 Morning   predniSONE (DELTASONE) 2.5 MG tablet Take 2.5 mg by mouth.   Taking   ranolazine (RANEXA) 1000 MG SR tablet Take 1 tablet (1,000 mg total) by mouth 2 (two) times daily. 180 tablet 3 03/27/2023 Morning   [EXPIRED] sulfamethoxazole-trimethoprim (BACTRIM) 400-80 MG tablet Take 1 tablet by mouth.   03/26/2023   atorvastatin (LIPITOR) 80 MG tablet Take 80 mg by mouth daily.   03/26/2023   metoprolol succinate (TOPROL-XL) 25 MG 24 hr tablet Take 25 mg by mouth  every evening.   03/26/2023   nitroGLYCERIN (NITRO-BID) 2 % ointment Apply 0.5 inches topically daily as needed. (Patient not taking: Reported on 01/03/2023)      Social History   Socioeconomic History   Marital status: Married    Spouse name: Not on file   Number of children: Not on file   Years of education: Not on file   Highest education level: Not on file  Occupational History   Not on file  Tobacco Use   Smoking status: Former    Current packs/day: 0.00    Average packs/day: 1 pack/day for 35.0 years (35.0 ttl pk-yrs)    Types: Cigarettes    Start date: 36    Quit date: 2009    Years since quitting: 16.2   Smokeless tobacco: Former  Building services engineer status: Never Used  Substance and Sexual Activity   Alcohol use: No   Drug use: No   Sexual activity: Yes  Other Topics Concern   Not on file  Social History Narrative   Not on file   Social Drivers of Health   Financial Resource Strain: Not on file  Food Insecurity: No Food Insecurity (03/27/2023)   Hunger Vital Sign    Worried About Running Out of Food in the Last Year: Never true    Ran Out of Food in the Last Year: Never true  Transportation Needs: No Transportation Needs (03/27/2023)   PRAPARE - Administrator, Civil Service (Medical): No    Lack of Transportation (Non-Medical): No  Physical Activity: Not on file  Stress: Not on file  Social Connections: Unknown (03/27/2023)   Social Connection and Isolation Panel [NHANES]    Frequency of Communication with Friends and Family: Three times a week    Frequency of Social Gatherings with Friends and Family: More than three times a week    Attends Religious Services: More than 4 times per year    Active Member of [employer] or Organizations: No    Attends Banker Meetings: Not on file    Marital Status: Not on file  Intimate Partner Violence: Not At Risk (03/27/2023)   Humiliation, Afraid, Rape, and Kick questionnaire    Fear of Current or  Ex-Partner: No    Emotionally Abused: No    Physically Abused: No    Sexually Abused: No    Family History  Problem Relation Age of Onset   Heart attack Father 63       MI   Cancer  Maternal Uncle      Vitals:   04/03/23 0619 04/03/23 0659 04/03/23 0737 04/03/23 1230  BP: 105/71  (!) 97/58 (!) 114/92  Pulse: 61  (!) 54 62  Resp:  10 18 18   Temp:   97.6 F (36.4 C) 97.9 F (36.6 C)  TempSrc:    Oral  SpO2: 97%  95% 96%  Weight:      Height:        PHYSICAL EXAM General: Well-appearing elderly male, well nourished, in no acute distress. HEENT: Normocephalic and atraumatic. Neck: No JVD.  Lungs: Normal respiratory effort on room air. Clear bilaterally to auscultation. No wheezes, crackles, rhonchi.  Heart: HRRR. Normal S1 and S2 without gallops or murmurs.  Abdomen: Non-distended appearing.  Msk: Normal strength and tone for age. Extremities: Warm and well perfused. No clubbing, cyanosis.  No edema.  Neuro: Alert and oriented X 3. Psych: Answers questions appropriately.   Labs: Basic Metabolic Panel: Recent Labs    04/02/23 0445  NA 136  K 4.3  CL 103  CO2 26  GLUCOSE 95  BUN 25*  CREATININE 1.09  CALCIUM 9.1   Liver Function Tests: No results for input(s): "AST", "ALT", "ALKPHOS", "BILITOT", "PROT", "ALBUMIN" in the last 72 hours.  No results for input(s): "LIPASE", "AMYLASE" in the last 72 hours. CBC: Recent Labs    04/02/23 0446  WBC 9.2  HGB 16.2  HCT 47.2  MCV 92.5  PLT 234   Cardiac Enzymes: Recent Labs    03/31/23 1713 03/31/23 1937  TROPONINIHS 13 14   BNP: No results for input(s): "BNP" in the last 72 hours. D-Dimer: Recent Labs    04/02/23 0446  DDIMER <0.27   Hemoglobin A1C: No results for input(s): "HGBA1C" in the last 72 hours. Fasting Lipid Panel: No results for input(s): "CHOL", "HDL", "LDLCALC", "TRIG", "CHOLHDL", "LDLDIRECT" in the last 72 hours. Thyroid Function Tests: No results for input(s): "TSH", "T4TOTAL",  "T3FREE", "THYROIDAB" in the last 72 hours.  Invalid input(s): "FREET3" Anemia Panel: No results for input(s): "VITAMINB12", "FOLATE", "FERRITIN", "TIBC", "IRON", "RETICCTPCT" in the last 72 hours.   Radiology: MR CERVICAL SPINE WO CONTRAST Result Date: 03/29/2023 CLINICAL DATA:  Anterior cord syndrome.  Stroke-like symptoms. EXAM: MRI CERVICAL SPINE WITHOUT CONTRAST TECHNIQUE: Multiplanar, multisequence MR imaging of the cervical spine was performed. No intravenous contrast was administered. COMPARISON:  MRI cervical spine 12/08/2009.  Neck MRA 06/04/2021. FINDINGS: Technical note: Despite efforts by the technologist and patient, mild motion artifact is present on today's exam and could not be eliminated. This reduces exam sensitivity and specificity. Alignment: Reversal of the usual cervical lordosis with a mildly increased degenerative anterolisthesis at the C2-3 and C3-4 levels compared with remote cervical MRI. Minimal retrolisthesis at C5-6. Vertebrae: No evidence of acute fracture or traumatic subluxation. Progressive multilevel spondylosis with endplate osteophytes and facet hypertrophy. Cord: Progressive multilevel spinal stenosis without significant cord deformity. No definite abnormal cord signal. Posterior Fossa, vertebral arteries, paraspinal tissues: Visualized portions of the posterior fossa appear unremarkable.Dominant left vertebral artery with preserved flow voids bilaterally. No significant paraspinal findings. Disc levels: C2-3: Preserved disc height. Progressive facet hypertrophy and anterolisthesis with resulting effacement of the CSF surrounding the cord. No significant cord deformity. Mild foraminal narrowing bilaterally. C3-4: Progressive loss of disc height with disc bulging, uncinate spurring and asymmetric facet hypertrophy on the left. Effacement of the CSF surrounding the cord without cord deformity. Moderate to severe foraminal narrowing bilaterally. C4-5: Progressive spondylosis  with loss of disc height and  posterior osteophytes covering diffusely bulging disc material. Mild spinal stenosis without cord deformity. Chronic mild right and moderate left foraminal narrowing, similar to previous MRI. C5-6: Chronic spondylosis with loss of disc height and posterior osteophytes covering diffusely bulging disc material. Similar chronic effacement of the CSF surrounding the cord, asymmetric to the right. No significant cord deformity. Severe right and moderate left foraminal narrowing appears progressive. C6-7: Chronic spondylosis with loss of disc height and posterior osteophytes covering diffusely bulging disc material. Mild spinal stenosis without cord deformity. Similar mild right and moderate left foraminal narrowing. C7-T1: Progressive loss of disc height with annular disc bulging, endplate osteophytes and bilateral facet hypertrophy, worse on the right. No significant central spinal stenosis. There is progressive foraminal narrowing, moderate on the right and mild on the left. IMPRESSION: 1. No acute findings or clear explanation for the patient's symptoms. 2. Progressive multilevel cervical spondylosis compared with remote MRI from 2011. There is resulting multilevel spinal stenosis without significant cord deformity or abnormal cord signal. 3. Multilevel foraminal narrowing, most severe bilaterally at C3-4 and on the right at C5-6. Electronically Signed   By: Carey Bullocks M.D.   On: 03/29/2023 16:55   US Carotid Bilateral Result Date: 03/29/2023 CLINICAL DATA:  83 year old male with stroke EXAM: BILATERAL CAROTID DUPLEX ULTRASOUND TECHNIQUE: Wallace Cullens scale imaging, color Doppler and duplex ultrasound were performed of bilateral carotid and vertebral arteries in the neck. COMPARISON:  None Available. FINDINGS: Criteria: Quantification of carotid stenosis is based on velocity parameters that correlate the residual internal carotid diameter with NASCET-based stenosis levels, using the  diameter of the distal internal carotid lumen as the denominator for stenosis measurement. The following velocity measurements were obtained: RIGHT ICA:  Systolic 76 cm/sec, Diastolic 17 cm/sec CCA:  67 cm/sec SYSTOLIC ICA/CCA RATIO:  1.1 ECA:  97 cm/sec LEFT ICA:  Systolic 77 cm/sec, Diastolic 11 cm/sec CCA:  103 cm/sec SYSTOLIC ICA/CCA RATIO:  0.7 ECA:  95 cm/sec Right Brachial SBP: Not acquired Left Brachial SBP: Not acquired RIGHT CAROTID ARTERY: No significant calcifications of the right common carotid artery. Intermediate waveform maintained. Heterogeneous and partially calcified plaque at the right carotid bifurcation. No significant lumen shadowing. Low resistance waveform of the right ICA. No significant tortuosity. RIGHT VERTEBRAL ARTERY: Antegrade flow with low resistance waveform. LEFT CAROTID ARTERY: No significant calcifications of the left common carotid artery. Intermediate waveform maintained. Heterogeneous and partially calcified plaque at the left carotid bifurcation. No significant lumen shadowing. Low resistance waveform of the left ICA. No significant tortuosity. LEFT VERTEBRAL ARTERY:  Antegrade flow with low resistance waveform. IMPRESSION: Color duplex indicates minimal heterogeneous and calcified plaque, with no hemodynamically significant stenosis by duplex criteria in the extracranial cerebrovascular circulation. Signed, Yvone Neu. Miachel Roux, RPVI [facility] Electronically Signed   By: Gilmer Mor D.O.   On: 03/29/2023 16:54   EEG adult Result Date: 03/29/2023 Charlsie Quest, MD     03/29/2023  4:22 PM Patient Name: Scott [name] MRN: [id number] Epilepsy Attending: Charlsie Quest Referring Physician/Provider: Pennie Banter, DO Date: 03/29/2023 Duration: 31.39 mins Patient history: 82yo m with syncope. EEG to evaluate for seizure Level of alertness: Awake, asleep AEDs during EEG study: None Technical aspects: This  EEG study was done with scalp electrodes positioned according to the 10-20 International system of electrode placement. Electrical activity was reviewed with band pass filter of 1-70Hz , sensitivity of 7 uV/mm, display speed of 51mm/sec with a 60Hz  notched filter applied as  appropriate. EEG data were recorded continuously and digitally stored.  Video monitoring was available and reviewed as appropriate. Description: The posterior dominant rhythm consists of 8-9 Hz activity of moderate voltage (25-35 uV) seen predominantly in posterior head regions, symmetric and reactive to eye opening and eye closing. Sleep was characterized by vertex waves, sleep spindles (12 to 14 Hz), maximal frontocentral region.  There is an excessive amount of 15 to 18 Hz, 2-3 uV beta activity Hyperventilation and photic stimulation were not performed.   IMPRESSION: This study is within normal limits. No seizures or epileptiform discharges were seen throughout the recording. A normal interictal EEG does not exclude the diagnosis of epilepsy. Charlsie Quest   MR BRAIN WO CONTRAST Result Date: 03/28/2023 CLINICAL DATA:  Provided history: Headache, new onset. Persistent dizziness. EXAM: MRI HEAD WITHOUT CONTRAST TECHNIQUE: Multiplanar, multiecho pulse sequences of the brain and surrounding structures were obtained without intravenous contrast. COMPARISON:  Head CT 03/27/2023. FINDINGS: Intermittently motion degraded examination. Most notably, the axial T2 and axial FLAIR sequences are moderately motion degraded. Within this limitation, findings are as follows. Brain: Mild generalized cerebral atrophy. Multifocal T2 FLAIR hyperintense signal abnormality within the cerebral white matter, nonspecific but compatible with mild chronic small vessel ischemic disease. Punctate chronic microhemorrhage within the left cerebellar hemisphere (series 10, image 14). No cortical encephalomalacia is identified. There is no acute infarct. No evidence of an  intracranial mass. No extra-axial fluid collection. No midline shift. Vascular: Maintained flow voids within the proximal large arterial vessels. Skull and upper cervical spine: No focal worrisome marrow lesion. Incompletely assessed cervical spondylosis. At C3-C4, a central disc protrusion contributes to apparent moderate spinal canal stenosis (with mild flattening of the ventral spinal cord). 3 mm C2-C3 grade 1 anterolisthesis. Sinuses/Orbits: No mass or acute finding within the imaged orbits. Prior bilateral ocular lens replacement. Minimal mucosal thickening scattered within bilateral ethmoid air cells. IMPRESSION: 1. Intermittently motion degraded examination. 2. No evidence of an acute intracranial abnormality. The diffusion-weighted imaging is of good quality and there is no evidence of an acute infarct. 3. Mild chronic small vessel ischemic changes within the cerebral white matter. 4. Mild generalized cerebral atrophy. 5. Minor ethmoid sinus mucosal thickening bilaterally. 6. Incompletely assessed cervical spondylosis. At C3-C4, a central disc protrusion contributes to apparent moderate spinal canal stenosis (with mild flattening of the ventral spinal cord). Electronically Signed   By: Jackey Loge D.O.   On: 03/28/2023 14:12   ECHOCARDIOGRAM COMPLETE Result Date: 03/28/2023    ECHOCARDIOGRAM REPORT   Patient Name:   Scott [name] Date of Exam: 03/27/2023 Medical Rec #:  [id number]      Height:       70.0 in Accession #:    [id number]     Weight:       176.1 lb Date of Birth:  [birth year]       BSA:          1.978 m Patient Age:    82 years       BP:           127/63 mmHg Patient Gender: M              HR:           57 bpm. Exam Location:  [facility] Procedure: 2D Echo, Cardiac Doppler and Color Doppler (Both Spectral and Color            Flow Doppler were utilized during procedure). Indications:     R55 Syncope  History:         Patient has prior history of Echocardiogram examinations, most                  recent  03/21/2019. Cardiomyopathy, CAD, Signs/Symptoms:Dyspnea                  and Syncope; Risk Factors:Hypertension and Dyslipidemia.                  Pulmonary Emboli.  Sonographer:     Daphine Deutscher RDCS Referring Phys:  [id number] AMY N [facility] Diagnosing Phys: Alwyn Pea MD IMPRESSIONS  1. Left ventricular ejection fraction, by estimation, is 40 to 45%. The left ventricle has normal function. The left ventricle demonstrates global hypokinesis. Left ventricular diastolic parameters are consistent with Grade III diastolic dysfunction (restrictive).  2. Right ventricular systolic function is normal. The right ventricular size is normal.  3. Left atrial size was moderately dilated.  4. Right atrial size was moderately dilated.  5. The mitral valve is normal in structure. Mild mitral valve regurgitation.  6. The aortic valve is normal in structure. Aortic valve regurgitation is not visualized. FINDINGS  Left Ventricle: Left ventricular ejection fraction, by estimation, is 40 to 45%. The left ventricle has normal function. The left ventricle demonstrates global hypokinesis. The left ventricular internal cavity size was normal in size. There is no left ventricular hypertrophy. Left ventricular diastolic parameters are consistent with Grade III diastolic dysfunction (restrictive). Right Ventricle: The right ventricular size is normal. No increase in right ventricular wall thickness. Right ventricular systolic function is normal. Left Atrium: Left atrial size was moderately dilated. Right Atrium: Right atrial size was moderately dilated. Pericardium: There is no evidence of pericardial effusion. Mitral Valve: The mitral valve is normal in structure. Mild mitral valve regurgitation. Tricuspid Valve: The tricuspid valve is normal in structure. Tricuspid valve regurgitation is mild. Aortic Valve: The aortic valve is normal in structure. Aortic valve regurgitation is not visualized. Pulmonic Valve: The pulmonic valve was  normal in structure. Pulmonic valve regurgitation is not visualized. Aorta: The ascending aorta was not well visualized. IAS/Shunts: No atrial level shunt detected by color flow Doppler.  LEFT VENTRICLE PLAX 2D LVIDd:         5.30 cm   Diastology LVIDs:         4.80 cm   LV e' medial:    5.60 cm/s LV PW:         1.10 cm   LV E/e' medial:  16.8 LV IVS:        1.10 cm   LV e' lateral:   10.30 cm/s LVOT diam:     2.20 cm   LV E/e' lateral: 9.1 LV SV:         59 LV SV Index:   30 LVOT Area:     3.80 cm  RIGHT VENTRICLE            IVC RV Basal diam:  4.30 cm    IVC diam: 1.70 cm RV S prime:     9.20 cm/s TAPSE (M-mode): 2.2 cm LEFT ATRIUM           Index        RIGHT ATRIUM           Index LA diam:      4.90 cm 2.48 cm/m   RA Area:     18.50 cm LA Vol (A2C): 23.0 ml 11.63 ml/m  RA Volume:   51.30  ml  25.94 ml/m LA Vol (A4C): 44.5 ml 22.50 ml/m  AORTIC VALVE LVOT Vmax:   62.20 cm/s LVOT Vmean:  41.600 cm/s LVOT VTI:    0.156 m  AORTA Ao Root diam: 3.30 cm Ao Asc diam:  3.90 cm MITRAL VALVE MV Area (PHT): 3.78 cm    SHUNTS MV Decel Time: 201 msec    Systemic VTI:  0.16 m MV E velocity: 93.80 cm/s  Systemic Diam: 2.20 cm MV A velocity: 47.95 cm/s MV E/A ratio:  1.96 Dwayne D Callwood MD Electronically signed by Alwyn Pea MD Signature Date/Time: 03/28/2023/12:41:21 PM    Final    CT HEAD WO CONTRAST ( ) Result Date: 03/28/2023 CLINICAL DATA:  Neuro deficit, acute, stroke suspected Headache, neuro deficit Headache, new onset (Age >= 51y) EXAM: CT HEAD WITHOUT CONTRAST TECHNIQUE: Contiguous axial images were obtained from the base of the skull through the vertex without intravenous contrast. RADIATION DOSE REDUCTION: This exam was performed according to the departmental dose-optimization program which includes automated exposure control, adjustment of the mA and/or kV according to patient size and/or use of iterative reconstruction technique. COMPARISON:  MRI head and CT head Jun 04, 2021. FINDINGS: Brain: No  evidence of acute infarction, hemorrhage, hydrocephalus, extra-axial collection or mass lesion/mass effect. Vascular: No hyperdense vessel. Skull: No acute fracture. Sinuses/Orbits: Clear sinuses.  No acute orbital findings. Other: No mastoid effusions. IMPRESSION: No evidence of acute intracranial abnormality. Electronically Signed   By: Feliberto Harts M.D.   On: 03/28/2023 03:32   Portable Chest 1 View Result Date: 03/27/2023 CLINICAL DATA:  Dizziness with chest pain, initial encounter EXAM: PORTABLE CHEST 1 VIEW COMPARISON:  12/08/2022 FINDINGS: Postsurgical changes are again noted. Cardiac shadow is mildly prominent but accentuated by the portable technique. Lungs are well aerated bilaterally. No focal infiltrate or effusion is seen. No bony abnormality is noted. Mild interstitial changes are again seen. IMPRESSION: No active disease. Electronically Signed   By: Alcide Clever M.D.   On: 03/27/2023 22:26    ECHO as above  TELEMETRY reviewed by me 04/03/2023: Sinus rhythm rate 50-60s  EKG reviewed by me: Sinus bradycardia with first-degree AV block rate 50 bpm  Data reviewed by me 04/03/2023: last 24h vitals tele labs imaging I/O hospitalist progress note, neurology note  Principal Problem:   Symptomatic sinus bradycardia Active Problems:   Hyperlipidemia   History of smoking   Cardiac angina (HCC)   S/P drug eluting coronary stent placement   Essential hypertension   PAF (paroxysmal atrial fibrillation) (HCC)   Chronic anticoagulation   Coronary artery disease involving coronary bypass graft of native heart with angina pectoris with documented spasm (HCC)   Anxiety   Hyperlipemia   Ischemic cardiomyopathy   Chronic Chest Pain   Hx of CABG   Iron deficiency   Protein S deficiency (HCC)   CAD (coronary artery disease)   Near syncope   Headache with neurologic deficit    ASSESSMENT AND PLAN:  Scott [name] is a 83 y.o. male  with a past medical history of coronary artery disease  s/p CABG, hypertension, hyperlipidemia, ischemic cardiomyopathy, paroxysmal atrial fibrillation, history of pulmonary embolism, sinus bradycardia who presented to the ED on 03/27/2023 for dizziness and presyncope. Cardiology was consulted for further evaluation.   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  Noted to be bradycardic  in the 40-50s on telemetry.  No evidence of any high-grade AV block. Echo this admission with EF 40-45%, grade III diastolic dysfunction.  -Avoid AV nodal blockers, advised patient to not resume metoprolol on discharge. -No indication for permanent pacemaker implantation at this time. -Continue Eliquis 2.5 mg twice daily for stroke risk reduction. -Holter monitor on discharge for further evaluation. -Further workup of dizziness per primary, neurology.   # Coronary artery disease s/p CABG # Ischemic cardiomyopathy Patient with history of CAD and ischemic cardiomyopathy presented for dizziness as above.  Troponins normal x 2 at 13, 14.  EKG without acute ischemic changes. Nuclear stress test today with fixed defect consistent with hx of CAD and CABG.  -Continue atorvastatin 80 mg daily, Zetia 10 mg daily. -See plan for Eliquis as above, patient has been on monotherapy. -No plan for further cardiac diagnostics.   Ok for discharge today from a cardiac perspective. Will arrange for follow up in clinic with Dr. Darrold Junker in 1-2 weeks.     This patient's plan of care was discussed and created with Dr. Juliann Pares and he is in agreement.  Signed: Gale Journey, PA-C  04/03/2023, 2:03 PM [facility] Cardiology

## 2023-04-03 NOTE — Plan of Care (Signed)

## 2023-04-03 NOTE — TOC Progression Note (Addendum)
 Transition of Care [facility]) - Progression Note    Patient Details  Name: Scott [name] MRN: [id number] Date of Birth: 1940/07/29  [facility] CM/SW Contact  Truddie Hidden, RN Phone Number: 04/03/2023, 11:19 AM  Clinical Narrative:    Drucie Opitz still pending her TOC assistant, Nitchia P.   1:41pm Per Maia Breslow. P TOC assistant. Patient is approved for SNF. 3/11-3/17/25 next review date: 04/09/23. Plan Auth ID: [id number]. [phone] Shonda-coordinator direct line. MD notified.   1:45pm Per [facility], Admissions Coordinator, Sue Lush, patient can be accepted tomorrow.          Expected Discharge Plan and Services                                               Social Determinants of Health (SDOH) Interventions SDOH Screenings   Food Insecurity: No Food Insecurity (03/27/2023)  Housing: Unknown (03/27/2023)  Transportation Needs: No Transportation Needs (03/27/2023)  Utilities: Not At Risk (03/27/2023)  Social Connections: Unknown (03/27/2023)  Tobacco Use: Medium Risk (03/27/2023)    Readmission Risk Interventions     No data to display

## 2023-04-03 NOTE — Progress Notes (Signed)
 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 ... has had prior episode of near syncope, dizziness before however they would normally resolve after a few minutes or seconds. However today the symptoms continued. In addition he endorses a new headache that he has never experienced before. ..." See H&[name] for full HPI on admission & ED course.  Pt was admitted for further evaluation and management. Further hospital course and management as outlined below.   Consults: Cardiology Neurology   Assessment and Plan:  Sinus bradycardia - HR overall in 50's off metoprolol Hx of symptomatic bradycardia / 1st degree AV Block Frequent PVC's / Bigeminy - on telemetry Pre-syncope / Dizziness - not improved Paroxysmal A-fib - HR controlled Unstable Angina -- pt has been having ischemic CP at rest since admission, persistent despite Imdur, Ranexa, SL and nitropaste.   Normal EKG's and troponin each time. Chronic combined systolic/diastolic CHF ECHO - EF 40-45%, grade III DD, mild MR Hx of CAD s/[name] CABG and prior PCI - prior cath reports reviewed including 2022 most recently, 80% stenosed small distal branch not amenable to PCI, ?source of worsening angina. --Scott [name] Cardiology consulted -- following --Nuc stress test today -- report pending, fixed defect noted per Cards, no further procedures or evaluation planned, signed off on discharge tomorrow --Avoid AV nodal blockers - stop metoprolol on d/c --Imdur PTA dose resumed due to CP recurrence on lower  dose 3/8 --Ranexa resumed 3/8 PM after recurrent severe prolonged CP episodes off x less than 24 hrs --Telemetry --Ambulatory monitor has been placed per cardiology --Currently no urgent indication for permanent pacemaker --Continue Eliquis  Headache with neurologic deficit Involuntary Myoclonic Jerking Movements / Ataxia Acute ambulatory dysfunction due to above History of dizziness - current episode not resolving as typical episodes will, per pt.   Also reported noticing difficulty gathering his thoughts, gathering his words on day of admission. CT head wo contrast was negative MRI brain negative for stroke. MRI C-spine - multilevel spondylosis with spinal stenosis but normal cord signal EEG normal U/S carotids - no significant stenosis TSH normal B12 is low - started on supplementation Ammonia and TSH normal Orthostatic BP's negative / normal Attempted holding Ranexa, neuro side effects, resumed due to severe anginal symptoms PLAN: --Neurology consulted -- appreciate input --Cardiology discontinued Ranexa (side effects include "neurologic disorders" and dizziness, notably) --Orthostatic vitals have been negative  --Added trial low dose midodrine this afternoon to see if dizziness improves - main thing limiting his ability to ambulate --PT/OT evaluations - SNF recommended --[facility] consulted for placement --Outpatient Neurology followed up at [facility] requested --Trial Klonopin PRN per Neurology rec --Discussed meclizine w pt, given episodes last only seconds, little role for this.  In addition, symptoms do not sound vertigo-like in characterization  Vitamin B12 deficiency - vit B12 level 142 --Vitamin B12 1000 mcg  IM x 1 on 3/6, repeat tomorrow AM ordered --Continue weekly IM B12 injections with PCP as outpatient --Goal B12 level above 400 per Neurology recommendation  Essential hypertension BP's overall soft at times with SBP in 100's. Pt describes onset of dizziness every  time he  gets up, but orthostatics were negative --Trial low dose midodrine to see if dizziness improves --Stop metoprolol - Discontinue on d/c --on Imdur and Ranexa as above --Titrate regimen  Cervical spondylosis - noted on MRI brain, incompletely assessed.  MRI cervical spine - no cord signal abnormality, myelopathy ruled out. --PT/OT    Subjective: Pt seen after NM stress test, wife at bedside. He reports onset of chest pain during the test.  No jerking episodes overnight or so far today.  He continues to be frustrated by ongoing symptoms and lack of answers or solution.   Feels everything other than jerking is heart-related.       Physical Exam: Vitals:   04/03/23 0619 04/03/23 0659 04/03/23 0737 04/03/23 1230  BP: 105/71  (!) 97/58 (!) 114/92  Pulse: 61  (!) 54 62  Resp:  10 18 18   Temp:   97.6 F (36.4 C) 97.9 F (36.6 C)  TempSrc:    Oral  SpO2: 97%  95% 96%  Weight:      Height:       General exam: awake, alert, no acute distress HEENT: moist mucus membranes, hearing grossly normal  Respiratory system: on room air, normal respiratory effort. Cardiovascular system: RRR, no pedal edema.   Gastrointestinal system: soft, NT, ND Central nervous system: A&O x 3. no gross focal neurologic deficits, normal speech Skin: dry, intact, normal temperature Psychiatry: normal mood, congruent affect, judgement and insight appear normal    Data Reviewed:  No new labs today  PENDING - NM stress test results from today   --- Prior labs & studies ---   Ammonia normal 31 TSH normal 3.114 Vitamin B12 deficient 142 (ref range 180-914) Troponin: 16 >> 16 today normal   MRI brain 3/5 - negative for stroke.  Mild small vessel ischemic changes, mild generalized atrophy.  Incompletely assessed cervical spondylosis at c3-c4 with mild ventral cord flattening consistent with  moderate spinal canal stenosis  MRI C-spine -- no cord signal abnormalities or other acute findings to explain  symptoms.  Progressive multilevel spondylosis with spinal stenosis, but normal cord signal -- rules out myelopathy.  EEG - normal.  U/S carotids - no significant stenosis bilaterally   Family Communication: wife at bedside on rounds   Disposition: Status is: Inpatient Remains inpatient appropriate because:  --Stress test today, expect cardiology to sign off  --Needs SNF placement     Planned Discharge Destination: Home    Time spent: 42 minutes    Author: Pennie Banter, DO 04/03/2023 2:47 PM  For on call review [email or web address].

## 2023-04-04 ENCOUNTER — Inpatient Hospital Stay

## 2023-04-04 DIAGNOSIS — I480 Paroxysmal atrial fibrillation: Secondary | ICD-10-CM

## 2023-04-04 DIAGNOSIS — R519 Headache, unspecified: Secondary | ICD-10-CM

## 2023-04-04 DIAGNOSIS — H6120 Impacted cerumen, unspecified ear: Secondary | ICD-10-CM | POA: Insufficient documentation

## 2023-04-04 DIAGNOSIS — I951 Orthostatic hypotension: Secondary | ICD-10-CM | POA: Diagnosis not present

## 2023-04-04 DIAGNOSIS — H6123 Impacted cerumen, bilateral: Secondary | ICD-10-CM

## 2023-04-04 DIAGNOSIS — E538 Deficiency of other specified B group vitamins: Secondary | ICD-10-CM | POA: Insufficient documentation

## 2023-04-04 DIAGNOSIS — R29818 Other symptoms and signs involving the nervous system: Secondary | ICD-10-CM

## 2023-04-04 DIAGNOSIS — N179 Acute kidney failure, unspecified: Secondary | ICD-10-CM | POA: Diagnosis not present

## 2023-04-04 DIAGNOSIS — I209 Angina pectoris, unspecified: Secondary | ICD-10-CM

## 2023-04-04 LAB — NM MYOCAR MULTI W/SPECT W/WALL MOTION / EF
Estimated workload: 1
Exercise duration (min): 1 min
MPHR: 140 {beats}/min
Nuc Stress EF: 56 %
Peak HR: 71 {beats}/min
Percent HR: 50 %
Rest HR: 61 {beats}/min
Rest Nuclear Isotope Dose: 10.8 mCi
SDS: 2
SRS: 16
SSS: 12
Stress Nuclear Isotope Dose: 30.4 mCi
TID: 0.95

## 2023-04-04 LAB — TROPONIN I (HIGH SENSITIVITY): Troponin I (High Sensitivity): 20 ng/L — ABNORMAL HIGH (ref <18)

## 2023-04-04 LAB — GLUCOSE, CAPILLARY: Glucose-Capillary: 95 mg/dL (ref 70–99)

## 2023-04-04 MED ORDER — DICLOFENAC SODIUM 1 % EX GEL
2.0000 g | Freq: Four times a day (QID) | CUTANEOUS | Status: DC
  Administered 2023-04-04 – 2023-04-05 (×2): 2 g via TOPICAL
  Filled 2023-04-04: qty 100

## 2023-04-04 MED ORDER — VITAMIN B-12 1000 MCG PO TABS: 1000.0000 ug | ORAL_TABLET | Freq: Every day | ORAL | Status: DC

## 2023-04-04 MED ORDER — SODIUM CHLORIDE 0.9 % IV BOLUS
500.0000 mL | Freq: Once | INTRAVENOUS | Status: AC
  Administered 2023-04-04: 500 mL via INTRAVENOUS

## 2023-04-04 MED ORDER — CYANOCOBALAMIN 1000 MCG/ML IJ SOLN
1000.0000 ug | Freq: Once | INTRAMUSCULAR | Status: DC
  Filled 2023-04-04: qty 1

## 2023-04-04 MED ORDER — GADOBUTROL 1 MMOL/ML IV SOLN
7.0000 mL | Freq: Once | INTRAVENOUS | Status: AC | PRN
  Administered 2023-04-04: 7 mL via INTRAVENOUS

## 2023-04-04 MED ORDER — MIDODRINE HCL 5 MG PO TABS
10.0000 mg | ORAL_TABLET | Freq: Three times a day (TID) | ORAL | Status: DC
  Administered 2023-04-04 – 2023-04-05 (×3): 10 mg via ORAL
  Filled 2023-04-04 (×3): qty 2

## 2023-04-04 MED ORDER — CYANOCOBALAMIN 1000 MCG/ML IJ SOLN
1000.0000 ug | Freq: Once | INTRAMUSCULAR | Status: AC
  Administered 2023-04-05: 1000 ug via INTRAMUSCULAR
  Filled 2023-04-04: qty 1

## 2023-04-04 MED ORDER — CARBAMIDE PEROXIDE 6.5 % OT SOLN
5.0000 [drp] | Freq: Two times a day (BID) | OTIC | Status: DC
  Administered 2023-04-04 – 2023-04-05 (×3): 5 [drp] via OTIC
  Filled 2023-04-04: qty 15

## 2023-04-04 NOTE — Assessment & Plan Note (Addendum)
 Increase midodrine to 10 mg 3 times daily.  Give a fluid bolus today.  Could be the cause of the patient's dizziness.  MRI of the brain with contrast with IAC ordered.

## 2023-04-04 NOTE — Assessment & Plan Note (Signed)
 Creatinine improved from 1.42 to 1.09

## 2023-04-04 NOTE — Progress Notes (Signed)
 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    PT Comments  Pt is making gradual progress towards goals. Inconsistent jerking noted with mobility. Pt pending MRI this date. Able to ambulate, slightly symptomatic and + orthostatics this date. Increased chest pain noted this date. Decreased coordination with R LE. Will continue to progress as able. Pt is hopeful to dc to SNF next date.    If plan is discharge home, recommend the following: Two people to help with walking and/or transfers;A lot of help with bathing/dressing/bathroom;Assistance with cooking/housework;Help with stairs or ramp for entrance;Assist for transportation   Can travel by private vehicle     Yes  Equipment Recommendations  Rolling walker (2 wheels);BSC/3in1    Recommendations for Other Services       Precautions / Restrictions Precautions Precautions: Fall Recall of Precautions/Restrictions: Intact Restrictions Weight Bearing Restrictions Per Provider Order: No     Mobility  Bed Mobility Overal bed mobility: Needs Assistance Bed Mobility: Supine to Sit     Supine to sit: Supervision, HOB elevated     General bed mobility comments: safe technique, lack of insight and tries to get up quickly    Transfers Overall transfer level: Needs assistance Equipment used: Rolling walker (2 wheels) Transfers: Sit to/from Stand Sit to Stand: Contact guard assist           General transfer comment: quick movement with upright posture. RW used. Inconsistent jerky symptoms    Ambulation/Gait Ambulation/Gait assistance: Min  assist Gait Distance (Feet): 20 Feet Assistive device: Rolling walker (2 wheels) Gait Pattern/deviations: Step-through pattern, Decreased stride length       General Gait Details: ambulated very short laps in room due to dizziness, chest pain, and inconsistent jerking episodes. Pt with decreased safety awareness. Rates chest pain as "moderate" with exertion. Recommend chair follow for further ambulation. Has difficulty sequencing steps with ataxic gait   Stairs             Wheelchair Mobility     Tilt Bed    Modified Rankin (Stroke Patients Only)       Balance Overall balance assessment: Needs assistance Sitting-balance support: No upper extremity supported, Feet supported Sitting balance-Leahy Scale: Fair     Standing balance support: Bilateral upper extremity supported, During functional activity Standing balance-Leahy Scale: Fair                              Hotel manager: No apparent difficulties  Cognition Arousal: Alert Behavior During Therapy: WFL for tasks assessed/performed   PT - Cognitive impairments: No apparent impairments                         Following commands: Intact      Cueing Cueing Techniques: Verbal cues  Exercises Other Exercises Other Exercises: seated ther-ex performed on B LE x 5 reps including LAQ and alt marching. Also attempted standing marching. Decreased coordination on R LE    General Comments        Pertinent Vitals/Pain Pain Assessment Pain Assessment: Faces Faces Pain Scale: Hurts a little bit Pain Location: chest with mobility Pain Descriptors /  Indicators: Discomfort, Tightness Pain Intervention(s): Limited activity within patient's tolerance, Repositioned    Home Living                          Prior Function            PT Goals (current goals can now be found in the care plan section) Acute Rehab PT Goals Patient Stated Goal: to go home PT  Goal Formulation: With patient/family Time For Goal Achievement: 04/11/23 Potential to Achieve Goals: Fair Progress towards PT goals: Progressing toward goals    Frequency    Min 3X/week      PT Plan      Co-evaluation              AM-PAC PT "6 Clicks" Mobility   Outcome Measure  Help needed turning from your back to your side while in a flat bed without using bedrails?: None Help needed moving from lying on your back to sitting on the side of a flat bed without using bedrails?: None Help needed moving to and from a bed to a chair (including a wheelchair)?: A Little Help needed standing up from a chair using your arms (e.g., wheelchair or bedside chair)?: A Little Help needed to walk in hospital room?: A Lot Help needed climbing 3-5 steps with a railing? : A Lot 6 Click Score: 18    End of Session   Activity Tolerance: Patient tolerated treatment well Patient left: in bed;with bed alarm set (pending leaving to go to MRI) Nurse Communication: Mobility status PT Visit Diagnosis: Unsteadiness on feet (R26.81);Other abnormalities of gait and mobility (R26.89);Difficulty in walking, not elsewhere classified (R26.2);Muscle weakness (generalized) (M62.81);Dizziness and giddiness (R42)     Time: 1345-1401 PT Time Calculation (min) (ACUTE ONLY): 16 min  Charges:    $Therapeutic Exercise: 8-22 mins PT General Charges $$ ACUTE PT VISIT: 1 Visit                     Elizabeth Palau, PT, DPT, GCS [phone]    Scottie Metayer 04/04/2023, 3:27 PM

## 2023-04-04 NOTE — Assessment & Plan Note (Signed)
Debrox otic solution

## 2023-04-04 NOTE — Progress Notes (Signed)
 Progress Note   Patient: Scott [name] ZOX:[id number] DOB: Nov 23, 1940 DOA: 03/27/2023     6 DOS: the patient was seen and examined on 04/04/2023   Brief hospital course: 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  3/12.  Patient standing up and getting dizzy almost fell back into the bed.  Finger-nose slightly impaired.  Will get a repeat MRI of the brain with contrast with AIC.  Will increase midodrine 10 mg 3 times a day.  Will give another vitamin B12 injection today and tomorrow.  Assessment and Plan: * Orthostatic hypotension Increase midodrine to 10 mg 3 times daily.  Give a fluid bolus today.  Could be the cause of the patient's dizziness.  MRI of the brain with contrast with IAC ordered.  Cardiac angina (HCC) Continue Imdur and Ranexa.  Cardiology cleared for disposition home.  Started Voltaren gel.  AKI (acute kidney injury) (HCC) Creatinine improved from 1.42 to 1.09  Cerumen impaction Debrox otic solution  Vitamin B12 deficiency Repeat IM B12 injection today and again tomorrow and restart oral vitamin B12 after that  Headache with neurologic deficit Repeat MRI of the brain with contrast with AIC.  Symptomatic sinus bradycardia Patient has a history of bradycardia I do not think this is causing the patient's symptoms.  PAF (paroxysmal atrial fibrillation) (HCC) Continue Eliquis.        Subjective: Patient had a horrible night with regards to chest pain.  Every time he gets up he gets dizzy.  Patient stood up and almost fell back into the bed.  After steadying himself he was able to stand up and have a negative Romberg.  Finger-nose test slightly impaired.  Later he was seen sitting up in the chair  and looked a little bit better.  Patient very concerned about going out to rehab.  Physical Exam: Vitals:   04/04/23 0348 04/04/23 0458 04/04/23 0818 04/04/23 1121  BP: (!) 107/59  101/72 107/69  Pulse: (!) 53  (!) 52 (!) 47  Resp: 17  (!) 21 15  Temp: 97.6 F (36.4 C)  97.8 F (36.6 C) 97.6 F (36.4 C)  TempSrc: Axillary  Oral Oral  SpO2: 95%  96% 96%  Weight:  75.1 kg    Height:       Physical Exam HENT:     Head: Normocephalic.     Mouth/Throat:     Pharynx: No oropharyngeal exudate.  Eyes:     General: Lids are normal.     Conjunctiva/sclera: Conjunctivae normal.  Cardiovascular:     Rate and Rhythm: Normal rate and regular rhythm.     Heart sounds: Normal heart sounds, S1 normal and S2 normal.  Pulmonary:     Breath sounds: No decreased breath sounds, wheezing, rhonchi or rales.  Abdominal:     Palpations: Abdomen is soft.     Tenderness: There is no abdominal tenderness.  Musculoskeletal:     Right lower leg: No swelling.     Left lower leg: No swelling.  Skin:    General: Skin is warm.     Findings: No rash.  Neurological:     Mental Status: He is alert and oriented to person, place, and time.     Comments: Romberg negative.  Finger-nose slightly impaired left hand.  Heel shin  intact bilaterally.     Data Reviewed: Troponin x 2:  20 and 20.  Family Communication: Wife and son at the bedside  Disposition: Status is: Inpatient Will get MRI with contrast of the brain with AIC.  Increase midodrine to 10 mg 3 times daily with orthostasis today.  Fluid bolus with orthostasis today  Planned Discharge Destination: Rehab    Time spent: 28 minutes  Author: Alford Highland, MD 04/04/2023 2:46 PM  For on call review [email or web address].

## 2023-04-04 NOTE — Progress Notes (Signed)
 [facility] CLINIC CARDIOLOGY PROGRESS NOTE       Patient ID: Scott [name] MRN: [id number] DOB/AGE: 09-29-1940 83 y.o.  Admit date: 03/27/2023 Referring Physician Dr. Londell Moh Primary Physician [name], MD  Primary Cardiologist Dr. Darrold Junker Reason for Consultation dizziness, presyncope  HPI: Scott [name] is a 83 y.o. male  with a past medical history of coronary artery disease s/p CABG, hypertension, hyperlipidemia, ischemic cardiomyopathy, paroxysmal atrial fibrillation, history of pulmonary embolism, sinus bradycardia who presented to the ED on 03/27/2023 for dizziness and presyncope. Cardiology was consulted for further evaluation.   Interval history: -Patient seen and examined this AM with wife present at beside. -Reports he feels better overall similar, still with occasional dizziness typically when up/moving around.  -Had episode of CP last night, denies any at this time. When L chest wall palpated patient did have pain. -HR remains in the 50-60s, no evidence of high grade AVB.  -BP stable.   Review of systems complete and found to be negative unless listed above    Past Medical History:  Diagnosis Date   Arthritis    right hip, since a fall   CAD (coronary artery disease)    a. 04/2012 Cath: 3VD->Med Rx;  b. 04/2013 PCI RCA (2 DES); c. 03/2014 PCI: LAD 80 (3.0x23 Xience Alpine DES); d. 06/2014 CABG x 2 ([facility]) LIMA->LAD, VG->OM; e. 12/2014 Cath ([facility]): patent grafts->Med Rx; f. 08/2015 Cath: patent grafts; g. 11/2016 Cath: LM min irregs, LAD 20p, 7m, LCX 100p/m, RCA 20ost, 10p/m/d, VG->OM1 nl, LIMA->dLAD nl; f. 07/2017 MV: No isch, EF 51%.   Cardiomyopathy, ischemic    a. 04/2012 Echo: EF 40-45%;  b. 08/2013 Echo: EF 45-50%; c. 01/2015 Echo: EF 40-45%; d. 07/2016 Echo: EF 60-65%; e. 09/2017 Echo: EF 55-60%, Gr1 DD.   Carotid arterial disease (HCC)    a. 05/2013 Carotid U/S; bilat 40-50% ICA stenosis.   Chronic Chest Pain    USES NITRO   Chronic systolic CHF (congestive heart  failure) (HCC)    a. 01/2015 Echo: EF 40-45%; b. 07/2016 Echo: EF 60-65%; c. 09/2017 Echo: EF 55-60%, Gr1 DD, nl RV fxn.   Cough    Diverticulosis    Dizziness    Dyspnea    WITH EXERTION   Headache 1970's   migraine   History of blood transfusion 2016   post op   Hyperlipidemia    Hypertension    Iron deficiency anemia due to chronic blood loss 09/11/2016   Myocardial infarction [facility])    unsure of when   Pulmonary emboli (HCC) 07/2016   a. On Xarelto.   Reflux esophagitis    Sternal pain    a. 03/2016 s/p Sternal wire removal; b. 05/2016 s/p redo median sternotomy for sternal debridement and sternal plating.   Syncope 04/2018   CARDIOLOGIST WAS DR Mariah Milling AND HE WAS AWARE-NOW PT SEES PARASCHOS   Tubular adenoma of colon     Past Surgical History:  Procedure Laterality Date   CARDIAC CATHETERIZATION  05/01/2012   CARDIAC CATHETERIZATION  04/2013   [facility];x3 stent   CARDIAC CATHETERIZATION  01/11/15    [facility]   CARDIAC CATHETERIZATION N/A 09/16/2015   Procedure: LEFT HEART CATH AND CORS/GRAFTS ANGIOGRAPHY;  Surgeon: Antonieta Iba, MD;  Location: [facility] INVASIVE CV LAB;  Service: Cardiovascular;  Laterality: N/A;   CARPAL TUNNEL RELEASE     right hand   CATARACT EXTRACTION     LEFT   CATARACT EXTRACTION W/PHACO Left 09/16/2014   Procedure: CATARACT  EXTRACTION PHACO AND INTRAOCULAR LENS PLACEMENT (IOC);  Surgeon: Lockie Mola, MD;  Location: [facility];  Service: Ophthalmology;  Laterality: Left;   COLONOSCOPY     COLONOSCOPY WITH PROPOFOL N/A 01/28/2016   Procedure: COLONOSCOPY WITH PROPOFOL;  Surgeon: Christena Deem, MD;  Location: [facility] ENDOSCOPY;  Service: Endoscopy;  Laterality: N/A;   COLONOSCOPY WITH PROPOFOL N/A 08/23/2021   Procedure: COLONOSCOPY WITH PROPOFOL;  Surgeon: Toney Reil, MD;  Location: [facility] ENDOSCOPY;  Service: Gastroenterology;  Laterality: N/A;  REQUESTS 9AM OR LATER   CORONARY ANGIOPLASTY WITH STENT PLACEMENT  04/13/2014   CORONARY ARTERY  BYPASS GRAFT  06-26-14   x3 bypasses   CORONARY PRESSURE/FFR WITH 3D MAPPING N/A 12/10/2018   Procedure: Mellody Dance PRESSURE WIRE/FFR STUDY;  Surgeon: Marcina Millard, MD;  Location: [facility] INVASIVE CV LAB;  Service: Cardiovascular;  Laterality: N/A;   CORONARY ULTRASOUND/IVUS N/A 12/10/2018   Procedure: Intravascular Ultrasound/IVUS;  Surgeon: Marcina Millard, MD;  Location: [facility] INVASIVE CV LAB;  Service: Cardiovascular;  Laterality: N/A;   CYSTOSCOPY N/A 08/14/2018   Procedure: CYSTOSCOPY;  Surgeon: Riki Altes, MD;  Location: [facility] ORS;  Service: Urology;  Laterality: N/A;   DE QUERVAIN'S RELEASE Left 08/22/2012   ESOPHAGOGASTRODUODENOSCOPY (EGD) WITH PROPOFOL N/A 04/26/2015   Procedure: ESOPHAGOGASTRODUODENOSCOPY (EGD) WITH PROPOFOL;  Surgeon: Wallace Cullens, MD;  Location[facility] ENDOSCOPY;  Service: Gastroenterology;  Laterality: N/A;   ESOPHAGOGASTRODUODENOSCOPY (EGD) WITH PROPOFOL N/A 05/29/2017   Procedure: ESOPHAGOGASTRODUODENOSCOPY (EGD) WITH PROPOFOL;  Surgeon: Christena Deem, MD;  Location: [facility] ENDOSCOPY;  Service: Endoscopy;  Laterality: N/A;   LEFT HEART CATH AND CORONARY ANGIOGRAPHY N/A 12/04/2016   Procedure: LEFT HEART CATH AND CORS/GRAFTS  ANGIOGRAPHY;  Surgeon: Iran Ouch, MD;  Location: [facility] INVASIVE CV LAB;  Service: Cardiovascular;  Laterality: N/A;   LEFT HEART CATH AND CORS/GRAFTS ANGIOGRAPHY N/A 04/09/2018   Procedure: LEFT HEART CATH AND CORS/GRAFTS ANGIOGRAPHY;  Surgeon: Marcina Millard, MD;  Location: [facility] INVASIVE CV LAB;  Service: Cardiovascular;  Laterality: N/A;   LEFT HEART CATH AND CORS/GRAFTS ANGIOGRAPHY N/A 12/10/2018   Procedure: LEFT HEART CATH AND CORS/GRAFTS ANGIOGRAPHY;  Surgeon: Marcina Millard, MD;  Location: [facility] INVASIVE CV LAB;  Service: Cardiovascular;  Laterality: N/A;   LEFT HEART CATH AND CORS/GRAFTS ANGIOGRAPHY Left 05/25/2020   Procedure: LEFT HEART CATH AND CORS/GRAFTS ANGIOGRAPHY;  Surgeon: Marcina Millard, MD;   Location: [facility] INVASIVE CV LAB;  Service: Cardiovascular;  Laterality: Left;   RIB PLATING N/A 05/25/2016   Procedure: STERNAL PLATING;  Surgeon: Loreli Slot, MD;  Location: [facility] OR;  Service: Thoracic;  Laterality: N/A;   right shoulder     STERNAL WIRES REMOVAL N/A 03/30/2016   Procedure: STERNAL WIRES REMOVAL;  Surgeon: Loreli Slot, MD;  Location: [facility] OR;  Service: Thoracic;  Laterality: N/A;   TONSILLECTOMY      Facility-Administered Medications Prior to Admission  Medication Dose Route Frequency Provider Last Rate Last Admin   lidocaine (XYLOCAINE) 2 % jelly 1 application  1 application  Urethral Once Stoioff, Verna Czech, MD       Medications Prior to Admission  Medication Sig Dispense Refill Last Dose/Taking   [EXPIRED] azithromycin (ZITHROMAX) 250 MG tablet Take 250 mg by mouth daily.   03/27/2023 Morning   ELIQUIS 2.5 MG TABS tablet Take 2.5 mg by mouth 2 (two) times daily.   03/27/2023 Morning   ezetimibe (ZETIA) 10 MG tablet Take 1 tablet (10 mg total) by mouth daily. (Patient taking differently: Take 10 mg by mouth at bedtime.)  90 tablet 3 03/26/2023   fluticasone-salmeterol (ADVAIR) 250-50 MCG/ACT AEPB Inhale 1 puff into the lungs in the morning and at bedtime.   03/27/2023 Morning   Ipratropium-Albuterol (COMBIVENT) 20-100 MCG/ACT AERS respimat Inhale 2 puffs into the lungs 4 (four) times daily as needed for wheezing.   03/27/2023   isosorbide mononitrate (IMDUR) 60 MG 24 hr tablet Take 60 mg by mouth 2 (two) times daily.    03/27/2023 Morning   [EXPIRED] predniSONE (DELTASONE) 10 MG tablet Take 10 mg by mouth.   03/27/2023 Morning   predniSONE (DELTASONE) 2.5 MG tablet Take 2.5 mg by mouth.   Taking   ranolazine (RANEXA) 1000 MG SR tablet Take 1 tablet (1,000 mg total) by mouth 2 (two) times daily. 180 tablet 3 03/27/2023 Morning   [EXPIRED] sulfamethoxazole-trimethoprim (BACTRIM) 400-80 MG tablet Take 1 tablet by mouth.   03/26/2023   atorvastatin (LIPITOR) 80 MG tablet Take 80 mg by  mouth daily.   03/26/2023   metoprolol succinate (TOPROL-XL) 25 MG 24 hr tablet Take 25 mg by mouth every evening.   03/26/2023   nitroGLYCERIN (NITRO-BID) 2 % ointment Apply 0.5 inches topically daily as needed. (Patient not taking: Reported on 01/03/2023)      Social History   Socioeconomic History   Marital status: Married    Spouse name: Not on file   Number of children: Not on file   Years of education: Not on file   Highest education level: Not on file  Occupational History   Not on file  Tobacco Use   Smoking status: Former    Current packs/day: 0.00    Average packs/day: 1 pack/day for 35.0 years (35.0 ttl pk-yrs)    Types: Cigarettes    Start date: 75    Quit date: 2009    Years since quitting: 16.2   Smokeless tobacco: Former  Building services engineer status: Never Used  Substance and Sexual Activity   Alcohol use: No   Drug use: No   Sexual activity: Yes  Other Topics Concern   Not on file  Social History Narrative   Not on file   Social Drivers of Health   Financial Resource Strain: Not on file  Food Insecurity: No Food Insecurity (03/27/2023)   Hunger Vital Sign    Worried About Running Out of Food in the Last Year: Never true    Ran Out of Food in the Last Year: Never true  Transportation Needs: No Transportation Needs (03/27/2023)   PRAPARE - Administrator, Civil Service (Medical): No    Lack of Transportation (Non-Medical): No  Physical Activity: Not on file  Stress: Not on file  Social Connections: Unknown (03/27/2023)   Social Connection and Isolation Panel [NHANES]    Frequency of Communication with Friends and Family: Three times a week    Frequency of Social Gatherings with Friends and Family: More than three times a week    Attends Religious Services: More than 4 times per year    Active Member of [employer] or Organizations: No    Attends Banker Meetings: Not on file    Marital Status: Not on file  Intimate Partner Violence: Not At  Risk (03/27/2023)   Humiliation, Afraid, Rape, and Kick questionnaire    Fear of Current or Ex-Partner: No    Emotionally Abused: No    Physically Abused: No    Sexually Abused: No    Family History  Problem Relation Age of Onset  Heart attack Father 9       MI   Cancer Maternal Uncle      Vitals:   04/03/23 2345 04/04/23 0348 04/04/23 0458 04/04/23 0818  BP: 106/63 (!) 107/59  101/72  Pulse: 63 (!) 53  (!) 52  Resp: 19 17  (!) 21  Temp: 97.9 F (36.6 C) 97.6 F (36.4 C)  97.8 F (36.6 C)  TempSrc: Oral Axillary  Oral  SpO2: 93% 95%  96%  Weight:   75.1 kg   Height:        PHYSICAL EXAM General: Well-appearing elderly male, well nourished, in no acute distress. HEENT: Normocephalic and atraumatic. Neck: No JVD.  Lungs: Normal respiratory effort on room air. Clear bilaterally to auscultation. No wheezes, crackles, rhonchi.  Heart: HRRR. Normal S1 and S2 without gallops or murmurs.  Abdomen: Non-distended appearing.  Msk: Normal strength and tone for age. Extremities: Warm and well perfused. No clubbing, cyanosis.  No edema.  Neuro: Alert and oriented X 3. Psych: Answers questions appropriately.   Labs: Basic Metabolic Panel: Recent Labs    04/02/23 0445  NA 136  K 4.3  CL 103  CO2 26  GLUCOSE 95  BUN 25*  CREATININE 1.09  CALCIUM 9.1   Liver Function Tests: No results for input(s): "AST", "ALT", "ALKPHOS", "BILITOT", "PROT", "ALBUMIN" in the last 72 hours.  No results for input(s): "LIPASE", "AMYLASE" in the last 72 hours. CBC: Recent Labs    04/02/23 0446  WBC 9.2  HGB 16.2  HCT 47.2  MCV 92.5  PLT 234   Cardiac Enzymes: Recent Labs    04/03/23 2324 04/04/23 0125  TROPONINIHS 20* 20*   BNP: No results for input(s): "BNP" in the last 72 hours. D-Dimer: Recent Labs    04/02/23 0446  DDIMER <0.27   Hemoglobin A1C: No results for input(s): "HGBA1C" in the last 72 hours. Fasting Lipid Panel: No results for input(s): "CHOL", "HDL",  "LDLCALC", "TRIG", "CHOLHDL", "LDLDIRECT" in the last 72 hours. Thyroid Function Tests: No results for input(s): "TSH", "T4TOTAL", "T3FREE", "THYROIDAB" in the last 72 hours.  Invalid input(s): "FREET3" Anemia Panel: No results for input(s): "VITAMINB12", "FOLATE", "FERRITIN", "TIBC", "IRON", "RETICCTPCT" in the last 72 hours.   Radiology: DG Chest 1 View Result Date: 04/04/2023 CLINICAL DATA:  Chest pain EXAM: CHEST  1 VIEW COMPARISON:  Chest x-ray 03/27/2023 FINDINGS: Sternotomy present. The heart size and mediastinal contours are within normal limits. Both lungs are clear. The visualized skeletal structures are unremarkable. IMPRESSION: No active disease. Electronically Signed   By: Darliss Cheney M.D.   On: 04/04/2023 00:49   MR CERVICAL SPINE WO CONTRAST Result Date: 03/29/2023 CLINICAL DATA:  Anterior cord syndrome.  Stroke-like symptoms. EXAM: MRI CERVICAL SPINE WITHOUT CONTRAST TECHNIQUE: Multiplanar, multisequence MR imaging of the cervical spine was performed. No intravenous contrast was administered. COMPARISON:  MRI cervical spine 12/08/2009.  Neck MRA 06/04/2021. FINDINGS: Technical note: Despite efforts by the technologist and patient, mild motion artifact is present on today's exam and could not be eliminated. This reduces exam sensitivity and specificity. Alignment: Reversal of the usual cervical lordosis with a mildly increased degenerative anterolisthesis at the C2-3 and C3-4 levels compared with remote cervical MRI. Minimal retrolisthesis at C5-6. Vertebrae: No evidence of acute fracture or traumatic subluxation. Progressive multilevel spondylosis with endplate osteophytes and facet hypertrophy. Cord: Progressive multilevel spinal stenosis without significant cord deformity. No definite abnormal cord signal. Posterior Fossa, vertebral arteries, paraspinal tissues: Visualized portions of the posterior fossa appear unremarkable.Dominant  left vertebral artery with preserved flow voids  bilaterally. No significant paraspinal findings. Disc levels: C2-3: Preserved disc height. Progressive facet hypertrophy and anterolisthesis with resulting effacement of the CSF surrounding the cord. No significant cord deformity. Mild foraminal narrowing bilaterally. C3-4: Progressive loss of disc height with disc bulging, uncinate spurring and asymmetric facet hypertrophy on the left. Effacement of the CSF surrounding the cord without cord deformity. Moderate to severe foraminal narrowing bilaterally. C4-5: Progressive spondylosis with loss of disc height and posterior osteophytes covering diffusely bulging disc material. Mild spinal stenosis without cord deformity. Chronic mild right and moderate left foraminal narrowing, similar to previous MRI. C5-6: Chronic spondylosis with loss of disc height and posterior osteophytes covering diffusely bulging disc material. Similar chronic effacement of the CSF surrounding the cord, asymmetric to the right. No significant cord deformity. Severe right and moderate left foraminal narrowing appears progressive. C6-7: Chronic spondylosis with loss of disc height and posterior osteophytes covering diffusely bulging disc material. Mild spinal stenosis without cord deformity. Similar mild right and moderate left foraminal narrowing. C7-T1: Progressive loss of disc height with annular disc bulging, endplate osteophytes and bilateral facet hypertrophy, worse on the right. No significant central spinal stenosis. There is progressive foraminal narrowing, moderate on the right and mild on the left. IMPRESSION: 1. No acute findings or clear explanation for the patient's symptoms. 2. Progressive multilevel cervical spondylosis compared with remote MRI from 2011. There is resulting multilevel spinal stenosis without significant cord deformity or abnormal cord signal. 3. Multilevel foraminal narrowing, most severe bilaterally at C3-4 and on the right at C5-6. Electronically Signed   By:  Carey Bullocks M.D.   On: 03/29/2023 16:55   US Carotid Bilateral Result Date: 03/29/2023 CLINICAL DATA:  83 year old male with stroke EXAM: BILATERAL CAROTID DUPLEX ULTRASOUND TECHNIQUE: Wallace Cullens scale imaging, color Doppler and duplex ultrasound were performed of bilateral carotid and vertebral arteries in the neck. COMPARISON:  None Available. FINDINGS: Criteria: Quantification of carotid stenosis is based on velocity parameters that correlate the residual internal carotid diameter with NASCET-based stenosis levels, using the diameter of the distal internal carotid lumen as the denominator for stenosis measurement. The following velocity measurements were obtained: RIGHT ICA:  Systolic 76 cm/sec, Diastolic 17 cm/sec CCA:  67 cm/sec SYSTOLIC ICA/CCA RATIO:  1.1 ECA:  97 cm/sec LEFT ICA:  Systolic 77 cm/sec, Diastolic 11 cm/sec CCA:  103 cm/sec SYSTOLIC ICA/CCA RATIO:  0.7 ECA:  95 cm/sec Right Brachial SBP: Not acquired Left Brachial SBP: Not acquired RIGHT CAROTID ARTERY: No significant calcifications of the right common carotid artery. Intermediate waveform maintained. Heterogeneous and partially calcified plaque at the right carotid bifurcation. No significant lumen shadowing. Low resistance waveform of the right ICA. No significant tortuosity. RIGHT VERTEBRAL ARTERY: Antegrade flow with low resistance waveform. LEFT CAROTID ARTERY: No significant calcifications of the left common carotid artery. Intermediate waveform maintained. Heterogeneous and partially calcified plaque at the left carotid bifurcation. No significant lumen shadowing. Low resistance waveform of the left ICA. No significant tortuosity. LEFT VERTEBRAL ARTERY:  Antegrade flow with low resistance waveform. IMPRESSION: Color duplex indicates minimal heterogeneous and calcified plaque, with no hemodynamically significant stenosis by duplex criteria in the extracranial cerebrovascular circulation. Signed, Yvone Neu. Miachel Roux, RPVI [facility] Radiology Electronically Signed   By: Gilmer Mor D.O.   On: 03/29/2023 16:54   EEG adult Result Date: 03/29/2023 Charlsie Quest, MD     03/29/2023  4:22 PM Patient Name: Laverda Sorenson  MRN: [id number] Epilepsy Attending: Charlsie Quest Referring Physician/Provider: Pennie Banter, DO Date: 03/29/2023 Duration: 31.39 mins Patient history: 82yo m with syncope. EEG to evaluate for seizure Level of alertness: Awake, asleep AEDs during EEG study: None Technical aspects: This EEG study was done with scalp electrodes positioned according to the 10-20 International system of electrode placement. Electrical activity was reviewed with band pass filter of 1-70Hz , sensitivity of 7 uV/mm, display speed of 64mm/sec with a 60Hz  notched filter applied as appropriate. EEG data were recorded continuously and digitally stored.  Video monitoring was available and reviewed as appropriate. Description: The posterior dominant rhythm consists of 8-9 Hz activity of moderate voltage (25-35 uV) seen predominantly in posterior head regions, symmetric and reactive to eye opening and eye closing. Sleep was characterized by vertex waves, sleep spindles (12 to 14 Hz), maximal frontocentral region.  There is an excessive amount of 15 to 18 Hz, 2-3 uV beta activity Hyperventilation and photic stimulation were not performed.   IMPRESSION: This study is within normal limits. No seizures or epileptiform discharges were seen throughout the recording. A normal interictal EEG does not exclude the diagnosis of epilepsy. Charlsie Quest   MR BRAIN WO CONTRAST Result Date: 03/28/2023 CLINICAL DATA:  Provided history: Headache, new onset. Persistent dizziness. EXAM: MRI HEAD WITHOUT CONTRAST TECHNIQUE: Multiplanar, multiecho pulse sequences of the brain and surrounding structures were obtained without intravenous contrast. COMPARISON:  Head CT 03/27/2023. FINDINGS: Intermittently motion degraded  examination. Most notably, the axial T2 and axial FLAIR sequences are moderately motion degraded. Within this limitation, findings are as follows. Brain: Mild generalized cerebral atrophy. Multifocal T2 FLAIR hyperintense signal abnormality within the cerebral white matter, nonspecific but compatible with mild chronic small vessel ischemic disease. Punctate chronic microhemorrhage within the left cerebellar hemisphere (series 10, image 14). No cortical encephalomalacia is identified. There is no acute infarct. No evidence of an intracranial mass. No extra-axial fluid collection. No midline shift. Vascular: Maintained flow voids within the proximal large arterial vessels. Skull and upper cervical spine: No focal worrisome marrow lesion. Incompletely assessed cervical spondylosis. At C3-C4, a central disc protrusion contributes to apparent moderate spinal canal stenosis (with mild flattening of the ventral spinal cord). 3 mm C2-C3 grade 1 anterolisthesis. Sinuses/Orbits: No mass or acute finding within the imaged orbits. Prior bilateral ocular lens replacement. Minimal mucosal thickening scattered within bilateral ethmoid air cells. IMPRESSION: 1. Intermittently motion degraded examination. 2. No evidence of an acute intracranial abnormality. The diffusion-weighted imaging is of good quality and there is no evidence of an acute infarct. 3. Mild chronic small vessel ischemic changes within the cerebral white matter. 4. Mild generalized cerebral atrophy. 5. Minor ethmoid sinus mucosal thickening bilaterally. 6. Incompletely assessed cervical spondylosis. At C3-C4, a central disc protrusion contributes to apparent moderate spinal canal stenosis (with mild flattening of the ventral spinal cord). Electronically Signed   By: Jackey Loge D.O.   On: 03/28/2023 14:12   ECHOCARDIOGRAM COMPLETE Result Date: 03/28/2023    ECHOCARDIOGRAM REPORT   Patient Name:   ALEXZANDER DOLINGER Date of Exam: 03/27/2023 Medical Rec #:  [id number]       Height:       70.0 in Accession #:    [id number]     Weight:       176.1 lb Date of Birth:  [birth year]       BSA:          1.978 m Patient Age:    62 years  BP:           127/63 mmHg Patient Gender: M              HR:           57 bpm. Exam Location:  [facility] Procedure: 2D Echo, Cardiac Doppler and Color Doppler (Both Spectral and Color            Flow Doppler were utilized during procedure). Indications:     R55 Syncope  History:         Patient has prior history of Echocardiogram examinations, most                  recent 03/21/2019. Cardiomyopathy, CAD, Signs/Symptoms:Dyspnea                  and Syncope; Risk Factors:Hypertension and Dyslipidemia.                  Pulmonary Emboli.  Sonographer:     Daphine Deutscher RDCS Referring Phys:  [id number] AMY N [facility] Diagnosing Phys: Alwyn Pea MD IMPRESSIONS  1. Left ventricular ejection fraction, by estimation, is 40 to 45%. The left ventricle has normal function. The left ventricle demonstrates global hypokinesis. Left ventricular diastolic parameters are consistent with Grade III diastolic dysfunction (restrictive).  2. Right ventricular systolic function is normal. The right ventricular size is normal.  3. Left atrial size was moderately dilated.  4. Right atrial size was moderately dilated.  5. The mitral valve is normal in structure. Mild mitral valve regurgitation.  6. The aortic valve is normal in structure. Aortic valve regurgitation is not visualized. FINDINGS  Left Ventricle: Left ventricular ejection fraction, by estimation, is 40 to 45%. The left ventricle has normal function. The left ventricle demonstrates global hypokinesis. The left ventricular internal cavity size was normal in size. There is no left ventricular hypertrophy. Left ventricular diastolic parameters are consistent with Grade III diastolic dysfunction (restrictive). Right Ventricle: The right ventricular size is normal. No increase in right ventricular wall thickness. Right  ventricular systolic function is normal. Left Atrium: Left atrial size was moderately dilated. Right Atrium: Right atrial size was moderately dilated. Pericardium: There is no evidence of pericardial effusion. Mitral Valve: The mitral valve is normal in structure. Mild mitral valve regurgitation. Tricuspid Valve: The tricuspid valve is normal in structure. Tricuspid valve regurgitation is mild. Aortic Valve: The aortic valve is normal in structure. Aortic valve regurgitation is not visualized. Pulmonic Valve: The pulmonic valve was normal in structure. Pulmonic valve regurgitation is not visualized. Aorta: The ascending aorta was not well visualized. IAS/Shunts: No atrial level shunt detected by color flow Doppler.  LEFT VENTRICLE PLAX 2D LVIDd:         5.30 cm   Diastology LVIDs:         4.80 cm   LV e' medial:    5.60 cm/s LV PW:         1.10 cm   LV E/e' medial:  16.8 LV IVS:        1.10 cm   LV e' lateral:   10.30 cm/s LVOT diam:     2.20 cm   LV E/e' lateral: 9.1 LV SV:         59 LV SV Index:   30 LVOT Area:     3.80 cm  RIGHT VENTRICLE            IVC RV Basal diam:  4.30 cm    IVC diam:  1.70 cm RV S prime:     9.20 cm/s TAPSE (M-mode): 2.2 cm LEFT ATRIUM           Index        RIGHT ATRIUM           Index LA diam:      4.90 cm 2.48 cm/m   RA Area:     18.50 cm LA Vol (A2C): 23.0 ml 11.63 ml/m  RA Volume:   51.30 ml  25.94 ml/m LA Vol (A4C): 44.5 ml 22.50 ml/m  AORTIC VALVE LVOT Vmax:   62.20 cm/s LVOT Vmean:  41.600 cm/s LVOT VTI:    0.156 m  AORTA Ao Root diam: 3.30 cm Ao Asc diam:  3.90 cm MITRAL VALVE MV Area (PHT): 3.78 cm    SHUNTS MV Decel Time: 201 msec    Systemic VTI:  0.16 m MV E velocity: 93.80 cm/s  Systemic Diam: 2.20 cm MV A velocity: 47.95 cm/s MV E/A ratio:  1.96 Dwayne D Callwood MD Electronically signed by Alwyn Pea MD Signature Date/Time: 03/28/2023/12:41:21 PM    Final    CT HEAD WO CONTRAST ( ) Result Date: 03/28/2023 CLINICAL DATA:  Neuro deficit, acute, stroke suspected  Headache, neuro deficit Headache, new onset (Age >= 51y) EXAM: CT HEAD WITHOUT CONTRAST TECHNIQUE: Contiguous axial images were obtained from the base of the skull through the vertex without intravenous contrast. RADIATION DOSE REDUCTION: This exam was performed according to the departmental dose-optimization program which includes automated exposure control, adjustment of the mA and/or kV according to patient size and/or use of iterative reconstruction technique. COMPARISON:  MRI head and CT head Jun 04, 2021. FINDINGS: Brain: No evidence of acute infarction, hemorrhage, hydrocephalus, extra-axial collection or mass lesion/mass effect. Vascular: No hyperdense vessel. Skull: No acute fracture. Sinuses/Orbits: Clear sinuses.  No acute orbital findings. Other: No mastoid effusions. IMPRESSION: No evidence of acute intracranial abnormality. Electronically Signed   By: Feliberto Harts M.D.   On: 03/28/2023 03:32   Portable Chest 1 View Result Date: 03/27/2023 CLINICAL DATA:  Dizziness with chest pain, initial encounter EXAM: PORTABLE CHEST 1 VIEW COMPARISON:  12/08/2022 FINDINGS: Postsurgical changes are again noted. Cardiac shadow is mildly prominent but accentuated by the portable technique. Lungs are well aerated bilaterally. No focal infiltrate or effusion is seen. No bony abnormality is noted. Mild interstitial changes are again seen. IMPRESSION: No active disease. Electronically Signed   By: Alcide Clever M.D.   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  male  with a past medical history of coronary artery disease s/p CABG, hypertension, hyperlipidemia, ischemic cardiomyopathy, paroxysmal atrial fibrillation, history of pulmonary embolism, sinus bradycardia who presented to the ED on 03/27/2023 for dizziness and presyncope. Cardiology was consulted for further evaluation.   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  Noted to be bradycardic in the 40-50s, now improved to 50-60s on telemetry.  No evidence of any high-grade AV block. Echo this admission with EF 40-45%, grade III diastolic dysfunction.  -Avoid AV nodal blockers, advised patient to not resume metoprolol on discharge. -No indication for permanent pacemaker implantation at this time. -Continue Eliquis 2.5 mg twice daily for stroke risk reduction. -Holter monitor in place for further evaluation. -Further workup of dizziness per primary, neurology. Patient has had CT, MRI, EEG, carotids, and multiple labs for evaluation all of which have been unremarkable aside from low B12.  # Coronary artery disease s/p CABG # Ischemic cardiomyopathy Patient with history of CAD and ischemic cardiomyopathy presented for dizziness as above.  Troponins normal x 2 at 13, 14.  EKG without acute ischemic changes. Nuclear stress test today with fixed defect  consistent with hx of CAD and CABG. Chest pain this AM worse with palpation.  -Continue atorvastatin 80 mg daily, Zetia 10 mg daily. -See plan for Eliquis as above, patient has been on monotherapy. -No plan for further cardiac diagnostics.  -Trial Voltaren gel for chest wall pain.  At this time we have no further recommendations for testing/evaluation. No indication for catheterization or pacemaker at this time. Ok for discharge today from a cardiac perspective. Will arrange for follow up in clinic with Dr. Darrold Junker in 1-2 weeks.   This patient's plan of care was discussed and created with Dr. Juliann Pares and he is in agreement.  Signed: Gale Journey, PA-C  04/04/2023, 10:56 AM [facility]

## 2023-04-04 NOTE — Assessment & Plan Note (Signed)
 Scott [name] received 3 IM B12 injections while here and oral B12 supplementation as outpatient.  Vitamin B12 level at 142.

## 2023-04-04 NOTE — Plan of Care (Signed)

## 2023-04-04 NOTE — Progress Notes (Signed)
 Patient complain of chest pain on mid area and shoulder, with pain scale of 8/10, EKG done relayed to Dr. Webb Silversmith NP, nitroglycerin SL given. Reassessed pain 3/10.   2220- chest pain of 5/10 with tenderness on palpation, morphine given Dr. Webb Silversmith NP made aware, ordered EKG, Chest xray and troponin. Schedule meds given. Patient feels much better.

## 2023-04-05 ENCOUNTER — Other Ambulatory Visit: Payer: Self-pay

## 2023-04-05 DIAGNOSIS — I209 Angina pectoris, unspecified: Secondary | ICD-10-CM | POA: Diagnosis not present

## 2023-04-05 DIAGNOSIS — I951 Orthostatic hypotension: Secondary | ICD-10-CM | POA: Diagnosis not present

## 2023-04-05 DIAGNOSIS — H6123 Impacted cerumen, bilateral: Secondary | ICD-10-CM | POA: Diagnosis not present

## 2023-04-05 DIAGNOSIS — N179 Acute kidney failure, unspecified: Secondary | ICD-10-CM | POA: Diagnosis not present

## 2023-04-05 LAB — CBC
HCT: 43.7 % (ref 39.0–52.0)
Hemoglobin: 15.2 g/dL (ref 13.0–17.0)
MCH: 32.7 pg (ref 26.0–34.0)
MCHC: 34.8 g/dL (ref 30.0–36.0)
MCV: 94 fL (ref 80.0–100.0)
Platelets: 182 10*3/uL (ref 150–400)
RBC: 4.65 MIL/uL (ref 4.22–5.81)
RDW: 14.7 % (ref 11.5–15.5)
WBC: 7.3 10*3/uL (ref 4.0–10.5)
nRBC: 0 % (ref 0.0–0.2)

## 2023-04-05 LAB — BASIC METABOLIC PANEL
Anion gap: 5 (ref 5–15)
BUN: 22 mg/dL (ref 8–23)
CO2: 25 mmol/L (ref 22–32)
Calcium: 8.5 mg/dL — ABNORMAL LOW (ref 8.9–10.3)
Chloride: 106 mmol/L (ref 98–111)
Creatinine, Ser: 1.03 mg/dL (ref 0.61–1.24)
GFR, Estimated: >60 mL/min (ref >60)
Glucose, Bld: 94 mg/dL (ref 70–99)
Potassium: 4.3 mmol/L (ref 3.5–5.1)
Sodium: 136 mmol/L (ref 135–145)

## 2023-04-05 LAB — GLUCOSE, CAPILLARY: Glucose-Capillary: 94 mg/dL (ref 70–99)

## 2023-04-05 MED ORDER — CYANOCOBALAMIN 1000 MCG PO TABS: 1000.0000 ug | ORAL_TABLET | Freq: Every day | ORAL | 0 refills | Status: DC

## 2023-04-05 MED ORDER — NITROGLYCERIN 0.4 MG SL SUBL: 0.4000 mg | SUBLINGUAL_TABLET | SUBLINGUAL | 0 refills | Status: DC | PRN

## 2023-04-05 MED ORDER — SENNOSIDES-DOCUSATE SODIUM 8.6-50 MG PO TABS: 1.0000 | ORAL_TABLET | Freq: Every evening | ORAL | 0 refills | Status: DC | PRN

## 2023-04-05 MED ORDER — MIDODRINE HCL 10 MG PO TABS: 10.0000 mg | ORAL_TABLET | Freq: Three times a day (TID) | ORAL | 0 refills | Status: DC

## 2023-04-05 MED ORDER — DICLOFENAC SODIUM 1 % EX GEL: 2.0000 g | Freq: Four times a day (QID) | CUTANEOUS | 0 refills | Status: DC

## 2023-04-05 MED ORDER — CARBAMIDE PEROXIDE 6.5 % OT SOLN: 5.0000 [drp] | Freq: Two times a day (BID) | OTIC | 0 refills | Status: DC

## 2023-04-05 NOTE — Discharge Summary (Signed)
 Physician Discharge Summary   Patient: Scott [name] MRN: [id number] DOB: March 10, 1940  Admit date:     03/27/2023  Discharge date: 04/05/23  Discharge Physician: Alford Highland   PCP: [name], MD   Recommendations at discharge:   Follow-up team at rehab 1 day Follow-up cardiology  Follow-up PCP upon discharge from rehab  Discharge Diagnoses: Principal Problem:   Orthostatic hypotension Active Problems:   Cardiac angina (HCC)   History of smoking   S/P drug eluting coronary stent placement   PAF (paroxysmal atrial fibrillation) (HCC)   Chronic anticoagulation   Coronary artery disease involving coronary bypass graft of native heart with angina pectoris with documented spasm (HCC)   Anxiety   Ischemic cardiomyopathy   Chronic Chest Pain   Hx of CABG   Iron deficiency   Protein S deficiency (HCC)   CAD (coronary artery disease)   Symptomatic sinus bradycardia   Near syncope   Headache with neurologic deficit   Vitamin B12 deficiency   Cerumen impaction   AKI (acute kidney injury) [facility])   Hospital Course: 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  3/12.  Patient standing up and getting dizzy almost fell back into the bed.  Finger-nose slightly impaired.  Will get a repeat MRI of the brain with contrast with AIC.  Will increase midodrine 10 mg 3 times a day.  Will give another vitamin B12 injection today and tomorrow. 3/13.  Patient feeling better today.  Continue increased dose of midodrine.  Will give another B12 injection today.  Oral B12 starting tomorrow.  Assessment and Plan: * Orthostatic hypotension Continue increased dose midodrine to 10 mg 3 times daily.  Could be the cause of the  patient's dizziness.  MRI of the brain with contrast with IAC negative for mass or stroke.  Previous MRI of the brain and cervical spine did not show any causes of his dizziness.  Cardiac angina (HCC) Continue Imdur and Ranexa.  Cardiology cleared for disposition home.  Started Voltaren gel.  AKI (acute kidney injury) (HCC) Creatinine improved from 1.42 to 1.03  Cerumen impaction Debrox otic solution  Vitamin B12 deficiency Will received 3 IM B12 injections while here and oral B12 supplementation as outpatient.  Vitamin B12 level at 142.  Headache with neurologic deficit Repeat MRI of the brain with AIC with contrast negative.  Symptomatic sinus bradycardia Patient has a history of bradycardia I do not think this is causing the patient's symptoms.  PAF (paroxysmal atrial fibrillation) (HCC) Continue Eliquis.         Consultants: Cardiology Procedures performed: None Disposition: Rehabilitation facility Diet recommendation:  Regular diet DISCHARGE MEDICATION: Allergies as of 04/05/2023       Reactions   Contrast Media [iodinated Contrast Media] Hives   Metrizamide Hives   Oxycodone Hcl Itching   Vicodin [hydrocodone-acetaminophen] Itching        Medication List     STOP taking these medications    atorvastatin 80 MG tablet Commonly known as: LIPITOR   azithromycin 250 MG tablet Commonly known as: ZITHROMAX   fluticasone-salmeterol 250-50 MCG/ACT Aepb Commonly known as: ADVAIR   metoprolol succinate 25 MG 24 hr tablet Commonly known as: TOPROL-XL   Nitro-Bid 2 % ointment Generic drug: nitroGLYCERIN Replaced by: nitroGLYCERIN 0.4 MG SL tablet  predniSONE 10 MG tablet Commonly known as: DELTASONE   predniSONE 2.5 MG tablet Commonly known as: DELTASONE   sulfamethoxazole-trimethoprim 400-80 MG tablet Commonly known as: BACTRIM       TAKE these medications    carbamide peroxide 6.5 % OTIC solution Commonly known as: DEBROX Place 5 drops  into both ears 2 (two) times daily.   cyanocobalamin 1000 MCG tablet Take 1 tablet (1,000 mcg total) by mouth daily. Start taking on: April 06, 2023   diclofenac Sodium 1 % Gel Commonly known as: VOLTAREN Apply 2 g topically 4 (four) times daily.   Eliquis 2.5 MG Tabs tablet Generic drug: apixaban Take 2.5 mg by mouth 2 (two) times daily.   ezetimibe 10 MG tablet Commonly known as: ZETIA Take 1 tablet (10 mg total) by mouth daily. What changed: when to take this   Ipratropium-Albuterol 20-100 MCG/ACT Aers respimat Commonly known as: COMBIVENT Inhale 2 puffs into the lungs 4 (four) times daily as needed for wheezing.   isosorbide mononitrate 60 MG 24 hr tablet Commonly known as: IMDUR Take 60 mg by mouth 2 (two) times daily.   midodrine 10 MG tablet Commonly known as: PROAMATINE Take 1 tablet (10 mg total) by mouth 3 (three) times daily with meals.   nitroGLYCERIN 0.4 MG SL tablet Commonly known as: NITROSTAT Place 1 tablet (0.4 mg total) under the tongue every 5 (five) minutes as needed for chest pain. Replaces: Nitro-Bid 2 % ointment   ranolazine 1000 MG SR tablet Commonly known as: RANEXA Take 1 tablet (1,000 mg total) by mouth 2 (two) times daily.   senna-docusate 8.6-50 MG tablet Commonly known as: Senokot-S Take 1 tablet by mouth at bedtime as needed for mild constipation.        Follow-up Information     Germaine Pomfret, Eliane Decree, NP. Go in 5 week(s).   Specialty: Nurse Practitioner Why: After completion of monitor Contact information: [address] [place] [address] [phone]         Morene Crocker, MD. Schedule an appointment as soon as possible for a visit.   Specialty: Neurology Why: Neurology follow up for myoclonic jerks recommended in 2-4 weeks, or soonest available if beyond 4 weeks.  Any provider. Contact information: [address] [facility] [address] [facility] [place] [address] [phone]                 Discharge Exam: Filed Weights   04/03/23 0422 04/04/23 0458 04/05/23 0532  Weight: 75.9 kg 75.1 kg 76 kg   Physical Exam HENT:     Head: Normocephalic.     Mouth/Throat:     Pharynx: No oropharyngeal exudate.  Eyes:     General: Lids are normal.     Conjunctiva/sclera: Conjunctivae normal.  Cardiovascular:     Rate and Rhythm: Normal rate and regular rhythm.     Heart sounds: Normal heart sounds, S1 normal and S2 normal.  Pulmonary:     Breath sounds: No decreased breath sounds, wheezing, rhonchi or rales.  Abdominal:     Palpations: Abdomen is soft.     Tenderness: There is no abdominal tenderness.  Musculoskeletal:     Right lower leg: No swelling.     Left lower leg: No swelling.  Skin:    General: Skin is warm.     Findings: No rash.  Neurological:     Mental Status: He is alert and oriented to person, place, and time.      Condition at discharge:  stable  The results of significant diagnostics from this hospitalization (including imaging, microbiology, ancillary and laboratory) are listed below for reference.   Imaging Studies: MR BRAIN/IAC W WO CONTRAST Result Date: 04/05/2023 CLINICAL DATA:  Dizziness EXAM: MRI HEAD WITHOUT AND WITH CONTRAST TECHNIQUE: Multiplanar, multiecho pulse sequences of the brain and surrounding structures were obtained without and with intravenous contrast. CONTRAST:  7mL GADAVIST GADOBUTROL 1 MMOL/ML IV SOLN COMPARISON:  03/28/2023 FINDINGS: Brain: No acute infarct, mass effect or extra-axial collection. No acute or chronic hemorrhage. There is multifocal hyperintense T2-weighted signal within the white matter. Parenchymal volume and CSF spaces are normal. The midline structures are normal. There is no cerebellopontine angle mass. The cochleae and semicircular canals are normal. No focal abnormality along the course of the 7th and 8th cranial nerves. Normal porus acusticus and vestibular aqueduct bilaterally. Vascular: Normal flow  voids. Skull and upper cervical spine: Normal calvarium and skull base. Visualized upper cervical spine and soft tissues are normal. Sinuses/Orbits:No paranasal sinus fluid levels or advanced mucosal thickening. No mastoid or middle ear effusion. Normal orbits. IMPRESSION: 1. No acute intracranial abnormality. 2. Findings of chronic small vessel ischemia. 3. Normal internal auditory canal structures. Electronically Signed   By: Deatra Robinson M.D.   On: 04/05/2023 00:32   NM Myocar Multi W/Spect W/Wall Motion / EF Result Date: 04/04/2023   Findings are consistent with prior infarction given fixed defect that is not reversible and actually perfusion appears to be better during stress. Fixed perfusion defect in the apical and inferoapical region, Also with fixed perfusion defect in the basal inferior,inferolateral and lateral region The study is intermediate risk.   LV perfusion is abnormal. There is no evidence of ischemia. There is evidence of prior infarction given fixed defect. Fixed perfusion defect in the apical and inferoapical region, Also with fixed perfusion defect in the basal inferior,inferolateral and lateral region   Left ventricular function is abnormal 40-45%. Global function is mildly reduced. End diastolic cavity size is normal. End systolic cavity size is normal. This study is unchanged compared to prior nuc in 2020.   DG Chest 1 View Result Date: 04/04/2023 CLINICAL DATA:  Chest pain EXAM: CHEST  1 VIEW COMPARISON:  Chest x-ray 03/27/2023 FINDINGS: Sternotomy present. The heart size and mediastinal contours are within normal limits. Both lungs are clear. The visualized skeletal structures are unremarkable. IMPRESSION: No active disease. Electronically Signed   By: Darliss Cheney M.D.   On: 04/04/2023 00:49   MR CERVICAL SPINE WO CONTRAST Result Date: 03/29/2023 CLINICAL DATA:  Anterior cord syndrome.  Stroke-like symptoms. EXAM: MRI CERVICAL SPINE WITHOUT CONTRAST TECHNIQUE: Multiplanar,  multisequence MR imaging of the cervical spine was performed. No intravenous contrast was administered. COMPARISON:  MRI cervical spine 12/08/2009.  Neck MRA 06/04/2021. FINDINGS: Technical note: Despite efforts by the technologist and patient, mild motion artifact is present on today's exam and could not be eliminated. This reduces exam sensitivity and specificity. Alignment: Reversal of the usual cervical lordosis with a mildly increased degenerative anterolisthesis at the C2-3 and C3-4 levels compared with remote cervical MRI. Minimal retrolisthesis at C5-6. Vertebrae: No evidence of acute fracture or traumatic subluxation. Progressive multilevel spondylosis with endplate osteophytes and facet hypertrophy. Cord: Progressive multilevel spinal stenosis without significant cord deformity. No definite abnormal cord signal. Posterior Fossa, vertebral arteries, paraspinal tissues: Visualized portions of the posterior fossa appear unremarkable.Dominant left vertebral artery with preserved flow voids bilaterally. No significant paraspinal findings. Disc levels: C2-3: Preserved disc height. Progressive facet hypertrophy and  anterolisthesis with resulting effacement of the CSF surrounding the cord. No significant cord deformity. Mild foraminal narrowing bilaterally. C3-4: Progressive loss of disc height with disc bulging, uncinate spurring and asymmetric facet hypertrophy on the left. Effacement of the CSF surrounding the cord without cord deformity. Moderate to severe foraminal narrowing bilaterally. C4-5: Progressive spondylosis with loss of disc height and posterior osteophytes covering diffusely bulging disc material. Mild spinal stenosis without cord deformity. Chronic mild right and moderate left foraminal narrowing, similar to previous MRI. C5-6: Chronic spondylosis with loss of disc height and posterior osteophytes covering diffusely bulging disc material. Similar chronic effacement of the CSF surrounding the cord,  asymmetric to the right. No significant cord deformity. Severe right and moderate left foraminal narrowing appears progressive. C6-7: Chronic spondylosis with loss of disc height and posterior osteophytes covering diffusely bulging disc material. Mild spinal stenosis without cord deformity. Similar mild right and moderate left foraminal narrowing. C7-T1: Progressive loss of disc height with annular disc bulging, endplate osteophytes and bilateral facet hypertrophy, worse on the right. No significant central spinal stenosis. There is progressive foraminal narrowing, moderate on the right and mild on the left. IMPRESSION: 1. No acute findings or clear explanation for the patient's symptoms. 2. Progressive multilevel cervical spondylosis compared with remote MRI from 2011. There is resulting multilevel spinal stenosis without significant cord deformity or abnormal cord signal. 3. Multilevel foraminal narrowing, most severe bilaterally at C3-4 and on the right at C5-6. Electronically Signed   By: Carey Bullocks M.D.   On: 03/29/2023 16:55   US Carotid Bilateral Result Date: 03/29/2023 CLINICAL DATA:  83 year old male with stroke EXAM: BILATERAL CAROTID DUPLEX ULTRASOUND TECHNIQUE: Wallace Cullens scale imaging, color Doppler and duplex ultrasound were performed of bilateral carotid and vertebral arteries in the neck. COMPARISON:  None Available. FINDINGS: Criteria: Quantification of carotid stenosis is based on velocity parameters that correlate the residual internal carotid diameter with NASCET-based stenosis levels, using the diameter of the distal internal carotid lumen as the denominator for stenosis measurement. The following velocity measurements were obtained: RIGHT ICA:  Systolic 76 cm/sec, Diastolic 17 cm/sec CCA:  67 cm/sec SYSTOLIC ICA/CCA RATIO:  1.1 ECA:  97 cm/sec LEFT ICA:  Systolic 77 cm/sec, Diastolic 11 cm/sec CCA:  103 cm/sec SYSTOLIC ICA/CCA RATIO:  0.7 ECA:  95 cm/sec Right Brachial SBP: Not acquired Left  Brachial SBP: Not acquired RIGHT CAROTID ARTERY: No significant calcifications of the right common carotid artery. Intermediate waveform maintained. Heterogeneous and partially calcified plaque at the right carotid bifurcation. No significant lumen shadowing. Low resistance waveform of the right ICA. No significant tortuosity. RIGHT VERTEBRAL ARTERY: Antegrade flow with low resistance waveform. LEFT CAROTID ARTERY: No significant calcifications of the left common carotid artery. Intermediate waveform maintained. Heterogeneous and partially calcified plaque at the left carotid bifurcation. No significant lumen shadowing. Low resistance waveform of the left ICA. No significant tortuosity. LEFT VERTEBRAL ARTERY:  Antegrade flow with low resistance waveform. IMPRESSION: Color duplex indicates minimal heterogeneous and calcified plaque, with no hemodynamically significant stenosis by duplex criteria in the extracranial cerebrovascular circulation. Signed, Yvone Neu. Miachel Roux, RPVI [facility] Electronically Signed   By: Gilmer Mor D.O.   On: 03/29/2023 16:54   EEG adult Result Date: 03/29/2023 Charlsie Quest, MD     03/29/2023  4:22 PM Patient Name: HARUTYUN MONTEVERDE MRN: [id number] Epilepsy Attending: Charlsie Quest Referring Physician/Provider: Pennie Banter, DO Date: 03/29/2023 Duration: 31.39 mins Patient history: 82yo m with  syncope. EEG to evaluate for seizure Level of alertness: Awake, asleep AEDs during EEG study: None Technical aspects: This EEG study was done with scalp electrodes positioned according to the 10-20 International system of electrode placement. Electrical activity was reviewed with band pass filter of 1-70Hz , sensitivity of 7 uV/mm, display speed of 56mm/sec with a 60Hz  notched filter applied as appropriate. EEG data were recorded continuously and digitally stored.  Video monitoring was available and reviewed as appropriate.  Description: The posterior dominant rhythm consists of 8-9 Hz activity of moderate voltage (25-35 uV) seen predominantly in posterior head regions, symmetric and reactive to eye opening and eye closing. Sleep was characterized by vertex waves, sleep spindles (12 to 14 Hz), maximal frontocentral region.  There is an excessive amount of 15 to 18 Hz, 2-3 uV beta activity Hyperventilation and photic stimulation were not performed.   IMPRESSION: This study is within normal limits. No seizures or epileptiform discharges were seen throughout the recording. A normal interictal EEG does not exclude the diagnosis of epilepsy. Charlsie Quest   MR BRAIN WO CONTRAST Result Date: 03/28/2023 CLINICAL DATA:  Provided history: Headache, new onset. Persistent dizziness. EXAM: MRI HEAD WITHOUT CONTRAST TECHNIQUE: Multiplanar, multiecho pulse sequences of the brain and surrounding structures were obtained without intravenous contrast. COMPARISON:  Head CT 03/27/2023. FINDINGS: Intermittently motion degraded examination. Most notably, the axial T2 and axial FLAIR sequences are moderately motion degraded. Within this limitation, findings are as follows. Brain: Mild generalized cerebral atrophy. Multifocal T2 FLAIR hyperintense signal abnormality within the cerebral white matter, nonspecific but compatible with mild chronic small vessel ischemic disease. Punctate chronic microhemorrhage within the left cerebellar hemisphere (series 10, image 14). No cortical encephalomalacia is identified. There is no acute infarct. No evidence of an intracranial mass. No extra-axial fluid collection. No midline shift. Vascular: Maintained flow voids within the proximal large arterial vessels. Skull and upper cervical spine: No focal worrisome marrow lesion. Incompletely assessed cervical spondylosis. At C3-C4, a central disc protrusion contributes to apparent moderate spinal canal stenosis (with mild flattening of the ventral spinal cord). 3 mm C2-C3  grade 1 anterolisthesis. Sinuses/Orbits: No mass or acute finding within the imaged orbits. Prior bilateral ocular lens replacement. Minimal mucosal thickening scattered within bilateral ethmoid air cells. IMPRESSION: 1. Intermittently motion degraded examination. 2. No evidence of an acute intracranial abnormality. The diffusion-weighted imaging is of good quality and there is no evidence of an acute infarct. 3. Mild chronic small vessel ischemic changes within the cerebral white matter. 4. Mild generalized cerebral atrophy. 5. Minor ethmoid sinus mucosal thickening bilaterally. 6. Incompletely assessed cervical spondylosis. At C3-C4, a central disc protrusion contributes to apparent moderate spinal canal stenosis (with mild flattening of the ventral spinal cord). Electronically Signed   By: Jackey Loge D.O.   On: 03/28/2023 14:12   ECHOCARDIOGRAM COMPLETE Result Date: 03/28/2023    ECHOCARDIOGRAM REPORT   Patient Name:   ACELIN FERDIG Date of Exam: 03/27/2023 Medical Rec #:  [id number]      Height:       70.0 in Accession #:    [id number]     Weight:       176.1 lb Date of Birth:  07/08/1940       BSA:          1.978 m Patient Age:    82 years       BP:           127/63 mmHg Patient Gender: M  HR:           57 bpm. Exam Location:  [facility] Procedure: 2D Echo, Cardiac Doppler and Color Doppler (Both Spectral and Color            Flow Doppler were utilized during procedure). Indications:     R55 Syncope  History:         Patient has prior history of Echocardiogram examinations, most                  recent 03/21/2019. Cardiomyopathy, CAD, Signs/Symptoms:Dyspnea                  and Syncope; Risk Factors:Hypertension and Dyslipidemia.                  Pulmonary Emboli.  Sonographer:     Daphine Deutscher RDCS Referring Phys:  [id number] AMY N [facility] Diagnosing Phys: Alwyn Pea MD IMPRESSIONS  1. Left ventricular ejection fraction, by estimation, is 40 to 45%. The left ventricle has normal function. The  left ventricle demonstrates global hypokinesis. Left ventricular diastolic parameters are consistent with Grade III diastolic dysfunction (restrictive).  2. Right ventricular systolic function is normal. The right ventricular size is normal.  3. Left atrial size was moderately dilated.  4. Right atrial size was moderately dilated.  5. The mitral valve is normal in structure. Mild mitral valve regurgitation.  6. The aortic valve is normal in structure. Aortic valve regurgitation is not visualized. FINDINGS  Left Ventricle: Left ventricular ejection fraction, by estimation, is 40 to 45%. The left ventricle has normal function. The left ventricle demonstrates global hypokinesis. The left ventricular internal cavity size was normal in size. There is no left ventricular hypertrophy. Left ventricular diastolic parameters are consistent with Grade III diastolic dysfunction (restrictive). Right Ventricle: The right ventricular size is normal. No increase in right ventricular wall thickness. Right ventricular systolic function is normal. Left Atrium: Left atrial size was moderately dilated. Right Atrium: Right atrial size was moderately dilated. Pericardium: There is no evidence of pericardial effusion. Mitral Valve: The mitral valve is normal in structure. Mild mitral valve regurgitation. Tricuspid Valve: The tricuspid valve is normal in structure. Tricuspid valve regurgitation is mild. Aortic Valve: The aortic valve is normal in structure. Aortic valve regurgitation is not visualized. Pulmonic Valve: The pulmonic valve was normal in structure. Pulmonic valve regurgitation is not visualized. Aorta: The ascending aorta was not well visualized. IAS/Shunts: No atrial level shunt detected by color flow Doppler.  LEFT VENTRICLE PLAX 2D LVIDd:         5.30 cm   Diastology LVIDs:         4.80 cm   LV e' medial:    5.60 cm/s LV PW:         1.10 cm   LV E/e' medial:  16.8 LV IVS:        1.10 cm   LV e' lateral:   10.30 cm/s LVOT  diam:     2.20 cm   LV E/e' lateral: 9.1 LV SV:         59 LV SV Index:   30 LVOT Area:     3.80 cm  RIGHT VENTRICLE            IVC RV Basal diam:  4.30 cm    IVC diam: 1.70 cm RV S prime:     9.20 cm/s TAPSE (M-mode): 2.2 cm LEFT ATRIUM           Index  RIGHT ATRIUM           Index LA diam:      4.90 cm 2.48 cm/m   RA Area:     18.50 cm LA Vol (A2C): 23.0 ml 11.63 ml/m  RA Volume:   51.30 ml  25.94 ml/m LA Vol (A4C): 44.5 ml 22.50 ml/m  AORTIC VALVE LVOT Vmax:   62.20 cm/s LVOT Vmean:  41.600 cm/s LVOT VTI:    0.156 m  AORTA Ao Root diam: 3.30 cm Ao Asc diam:  3.90 cm MITRAL VALVE MV Area (PHT): 3.78 cm    SHUNTS MV Decel Time: 201 msec    Systemic VTI:  0.16 m MV E velocity: 93.80 cm/s  Systemic Diam: 2.20 cm MV A velocity: 47.95 cm/s MV E/A ratio:  1.96 Dwayne D Callwood MD Electronically signed by Alwyn Pea MD Signature Date/Time: 03/28/2023/12:41:21 PM    Final    CT HEAD WO CONTRAST ( ) Result Date: 03/28/2023 CLINICAL DATA:  Neuro deficit, acute, stroke suspected Headache, neuro deficit Headache, new onset (Age >= 51y) EXAM: CT HEAD WITHOUT CONTRAST TECHNIQUE: Contiguous axial images were obtained from the base of the skull through the vertex without intravenous contrast. RADIATION DOSE REDUCTION: This exam was performed according to the departmental dose-optimization program which includes automated exposure control, adjustment of the mA and/or kV according to patient size and/or use of iterative reconstruction technique. COMPARISON:  MRI head and CT head Jun 04, 2021. FINDINGS: Brain: No evidence of acute infarction, hemorrhage, hydrocephalus, extra-axial collection or mass lesion/mass effect. Vascular: No hyperdense vessel. Skull: No acute fracture. Sinuses/Orbits: Clear sinuses.  No acute orbital findings. Other: No mastoid effusions. IMPRESSION: No evidence of acute intracranial abnormality. Electronically Signed   By: Feliberto Harts M.D.   On: 03/28/2023 03:32   Portable  Chest 1 View Result Date: 03/27/2023 CLINICAL DATA:  Dizziness with chest pain, initial encounter EXAM: PORTABLE CHEST 1 VIEW COMPARISON:  12/08/2022 FINDINGS: Postsurgical changes are again noted. Cardiac shadow is mildly prominent but accentuated by the portable technique. Lungs are well aerated bilaterally. No focal infiltrate or effusion is seen. No bony abnormality is noted. Mild interstitial changes are again seen. IMPRESSION: No active disease. Electronically Signed   By: Alcide Clever M.D.   On: 03/27/2023 22:26    Microbiology:   Labs: CBC: Recent Labs  Lab 04/02/23 0446 04/05/23 0530  WBC 9.2 7.3  HGB 16.2 15.2  HCT 47.2 43.7  MCV 92.5 94.0  PLT 234 182   Basic Metabolic Panel: Recent Labs  Lab 04/02/23 0445 04/05/23 0530  NA 136 136  K 4.3 4.3  CL 103 106  CO2 26 25  GLUCOSE 95 94  BUN 25* 22  CREATININE 1.09 1.03  CALCIUM 9.1 8.5*   Liver Function Tests: No results for input(s): "AST", "ALT", "ALKPHOS", "BILITOT", "PROT", "ALBUMIN" in the last 168 hours. CBG: Recent Labs  Lab 04/02/23 0434 04/03/23 0439 04/03/23 2039 04/04/23 0420 04/05/23 0535  GLUCAP 97 91 102* 95 94    Discharge time spent: greater than 30 minutes.  Signed: Alford Highland, MD [facility] 04/05/2023

## 2023-04-05 NOTE — TOC Transition Note (Signed)
 Transition of Care [facility]) - Discharge Note   Patient Details  Name: Scott [name] MRN: [id number] Date of Birth: [birth year]  Transition of Care [facility]) CM/SW Contact:  Truddie Hidden, RN Phone Number: 04/05/2023, 10:09 AM   Clinical Narrative:    Sherron Monday with Sue Lush in admissions at [facility]  Per facility patient admission confirmed for today. Patient assigned room # 112 Nurse will call report to [phone] Discharge summary sent in HUB Nurse, and family notified, contact Annice Pih his daughter Patient's wife will transport him to facility Sue Lush in admissions notified of cardiac appointment 4.16.2025 @ 3:15pm  TOC signing off.          Patient Goals and CMS Choice            Discharge Placement                       Discharge Plan and Services Additional resources added to the After Visit Summary for                                       Social Drivers of Health (SDOH) Interventions SDOH Screenings   Food Insecurity: No Food Insecurity (03/27/2023)  Housing: Low Risk  (04/04/2023)  Transportation Needs: No Transportation Needs (03/27/2023)  Utilities: Not At Risk (03/27/2023)  Social Connections: Moderately Integrated (04/04/2023)  Tobacco Use: Medium Risk (03/27/2023)     Readmission Risk Interventions     No data to display

## 2023-04-05 NOTE — Plan of Care (Signed)
  Problem: Education: Goal: Knowledge of condition and prescribed therapy will improve Outcome: Adequate for Discharge   Problem: Cardiac: Goal: Will achieve and/or maintain adequate cardiac output Outcome: Adequate for Discharge   Problem: Physical Regulation: Goal: Complications related to the disease process, condition or treatment will be avoided or minimized Outcome: Adequate for Discharge   Problem: Education: Goal: Knowledge of General Education information will improve Description: Including pain rating scale, medication(s)/side effects and non-pharmacologic comfort measures Outcome: Adequate for Discharge   Problem: Clinical Measurements: Goal: Ability to maintain clinical measurements within normal limits will improve Outcome: Adequate for Discharge Goal: Will remain free from infection Outcome: Adequate for Discharge Goal: Diagnostic test results will improve Outcome: Adequate for Discharge Goal: Respiratory complications will improve Outcome: Adequate for Discharge Goal: Cardiovascular complication will be avoided Outcome: Adequate for Discharge   Problem: Activity: Goal: Risk for activity intolerance will decrease Outcome: Adequate for Discharge   Problem: Coping: Goal: Level of anxiety will decrease Outcome: Adequate for Discharge   Problem: Elimination: Goal: Will not experience complications related to bowel motility Outcome: Adequate for Discharge Goal: Will not experience complications related to urinary retention Outcome: Adequate for Discharge   Problem: Pain Managment: Goal: General experience of comfort will improve and/or be controlled Outcome: Adequate for Discharge   Problem: Safety: Goal: Ability to remain free from injury will improve Outcome: Adequate for Discharge   Problem: Skin Integrity: Goal: Risk for impaired skin integrity will decrease Outcome: Adequate for Discharge

## 2023-04-06 ENCOUNTER — Encounter: Payer: Self-pay | Admitting: Adult Health

## 2023-04-06 ENCOUNTER — Non-Acute Institutional Stay (SKILLED_NURSING_FACILITY): Payer: Self-pay | Admitting: Adult Health

## 2023-04-06 DIAGNOSIS — I951 Orthostatic hypotension: Secondary | ICD-10-CM

## 2023-04-06 DIAGNOSIS — I480 Paroxysmal atrial fibrillation: Secondary | ICD-10-CM | POA: Diagnosis not present

## 2023-04-06 DIAGNOSIS — I209 Angina pectoris, unspecified: Secondary | ICD-10-CM | POA: Diagnosis not present

## 2023-04-06 DIAGNOSIS — H6123 Impacted cerumen, bilateral: Secondary | ICD-10-CM | POA: Diagnosis not present

## 2023-04-06 DIAGNOSIS — R5381 Other malaise: Secondary | ICD-10-CM

## 2023-04-06 NOTE — Progress Notes (Signed)
 Location:  (S) Other ([facility] Room Number: 12A Place of Service:  SNF ([phone]) Provider:  Kenard Gower, DNP, FNP-BC  Patient Care Team: [name], MD as PCP - General (Internal Medicine) Mariah Milling Tollie Pizza, MD as PCP - Cardiology (Cardiology) Rafael Bihari, MD (Internal Medicine) Delma Freeze, FNP as Nurse Practitioner (Cardiology) Antonieta Iba, MD as Consulting Physician (Cardiology) Rickard Patience, MD as Consulting Physician (Oncology)  Extended Emergency Contact Information Primary Emergency Contact: Edgar,[name] Address: [address]          [place], [place] [address] [name] of [place] Home Phone: [phone] Mobile Phone: [phone] Relation: Spouse Secondary Emergency Contact: Donalee Citrin Address: [address]          [place], [place] [address] [name] of [place] Home Phone: [phone] Mobile Phone: [phone] Relation: Daughter  Code Status:   DNR  Goals of care: Advanced Directive information    03/27/2023    9:52 PM  Advanced Directives  Does Patient Have a Medical Advance Directive? No  Type of Estate agent of [place];Living will  Copy of Healthcare Power of Attorney in Chart? No - copy requested  Would patient like information on creating a medical advance directive? No - Patient declined     Chief Complaint  Patient presents with   Acute Visit    Follow up hospitalization 3/4 - 04/05/23    HPI:  Pt is a 83 y.o. male seen today for follow up hospitalization 03/27/23 to 04/05/23. He was admitted to [facility] on 04/05/23 for short-term rehabilitation.  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  He was hospitalized for dizziness and near syncope episode.  MRI brain showed no acute intracranial abnormality.  He was diagnosed with  orthostatic hypotension and was ordered and increased midodrine to 10 mg 3 times daily.   Past Medical History:  Diagnosis Date   Arthritis    right hip, since a fall   CAD (coronary artery disease)    a. 04/2012 Cath: 3VD->Med Rx;  b. 04/2013 PCI RCA (2 DES); c. 03/2014 PCI: LAD 80 (3.0x23 Xience Alpine DES); d. 06/2014 CABG x 2 ([facility]) LIMA->LAD, VG->OM; e. 12/2014 Cath ([facility]): patent grafts->Med Rx; f. 08/2015 Cath: patent grafts; g. 11/2016 Cath: LM min irregs, LAD 20p, 36m, LCX 100p/m, RCA 20ost, 10p/m/d, VG->OM1 nl, LIMA->dLAD nl; f. 07/2017 MV: No isch, EF 51%.   Cardiomyopathy, ischemic    a. 04/2012 Echo: EF 40-45%;  b. 08/2013 Echo: EF 45-50%; c. 01/2015 Echo: EF 40-45%; d. 07/2016 Echo: EF 60-65%; e. 09/2017 Echo: EF 55-60%, Gr1 DD.   Carotid arterial disease (HCC)    a. 05/2013 Carotid U/S; bilat 40-50% ICA stenosis.   Chronic Chest Pain    USES NITRO   Chronic systolic CHF (congestive heart failure) (HCC)    a. 01/2015 Echo: EF 40-45%; b. 07/2016 Echo: EF 60-65%; c. 09/2017 Echo: EF 55-60%, Gr1 DD, nl RV fxn.   Cough    Diverticulosis    Dizziness    Dyspnea    WITH EXERTION   Headache 1970's   migraine   History of blood transfusion 2016   post op   Hyperlipidemia    Hypertension    Iron deficiency anemia due to chronic blood loss 09/11/2016   Myocardial infarction [facility])    unsure of when   Pulmonary emboli (HCC)  07/2016   a. On Xarelto.   Reflux esophagitis    Sternal pain    a. 03/2016 s/p Sternal wire removal; b. 05/2016 s/p redo median sternotomy for sternal debridement and sternal plating.   Syncope 04/2018   CARDIOLOGIST WAS DR Mariah Milling AND HE WAS AWARE-NOW PT SEES PARASCHOS   Tubular adenoma of colon    Past Surgical History:  Procedure Laterality Date   CARDIAC CATHETERIZATION  05/01/2012   CARDIAC CATHETERIZATION  04/2013   [facility];x3 stent   CARDIAC CATHETERIZATION  01/11/15    [facility]   CARDIAC CATHETERIZATION N/A 09/16/2015   Procedure: LEFT HEART CATH AND CORS/GRAFTS  ANGIOGRAPHY;  Surgeon: Antonieta Iba, MD;  Location: [facility] INVASIVE CV LAB;  Service: Cardiovascular;  Laterality: N/A;   CARPAL TUNNEL RELEASE     right hand   CATARACT EXTRACTION     LEFT   CATARACT EXTRACTION W/PHACO Left 09/16/2014   Procedure: CATARACT EXTRACTION PHACO AND INTRAOCULAR LENS PLACEMENT (IOC);  Surgeon: Lockie Mola, MD;  Location: [facility];  Service: Ophthalmology;  Laterality: Left;   COLONOSCOPY     COLONOSCOPY WITH PROPOFOL N/A 01/28/2016   Procedure: COLONOSCOPY WITH PROPOFOL;  Surgeon: Christena Deem, MD;  Location: [facility] ENDOSCOPY;  Service: Endoscopy;  Laterality: N/A;   COLONOSCOPY WITH PROPOFOL N/A 08/23/2021   Procedure: COLONOSCOPY WITH PROPOFOL;  Surgeon: Toney Reil, MD;  Location: [facility] ENDOSCOPY;  Service: Gastroenterology;  Laterality: N/A;  REQUESTS 9AM OR LATER   CORONARY ANGIOPLASTY WITH STENT PLACEMENT  04/13/2014   CORONARY ARTERY BYPASS GRAFT  06-26-14   x3 bypasses   CORONARY PRESSURE/FFR WITH 3D MAPPING N/A 12/10/2018   Procedure: Mellody Dance PRESSURE WIRE/FFR STUDY;  Surgeon: Marcina Millard, MD;  Location: [facility] INVASIVE CV LAB;  Service: Cardiovascular;  Laterality: N/A;   CORONARY ULTRASOUND/IVUS N/A 12/10/2018   Procedure: Intravascular Ultrasound/IVUS;  Surgeon: Marcina Millard, MD;  Location: [facility] INVASIVE CV LAB;  Service: Cardiovascular;  Laterality: N/A;   CYSTOSCOPY N/A 08/14/2018   Procedure: CYSTOSCOPY;  Surgeon: Riki Altes, MD;  Location: [facility] ORS;  Service: Urology;  Laterality: N/A;   DE QUERVAIN'S RELEASE Left 08/22/2012   ESOPHAGOGASTRODUODENOSCOPY (EGD) WITH PROPOFOL N/A 04/26/2015   Procedure: ESOPHAGOGASTRODUODENOSCOPY (EGD) WITH PROPOFOL;  Surgeon: Wallace Cullens, MD;  Location: [facility] ENDOSCOPY;  Service: Gastroenterology;  Laterality: N/A;   ESOPHAGOGASTRODUODENOSCOPY (EGD) WITH PROPOFOL N/A 05/29/2017   Procedure: ESOPHAGOGASTRODUODENOSCOPY (EGD) WITH PROPOFOL;  Surgeon: Christena Deem, MD;   Location: [facility] ENDOSCOPY;  Service: Endoscopy;  Laterality: N/A;   LEFT HEART CATH AND CORONARY ANGIOGRAPHY N/A 12/04/2016   Procedure: LEFT HEART CATH AND CORS/GRAFTS  ANGIOGRAPHY;  Surgeon: Iran Ouch, MD;  Location: [facility] INVASIVE CV LAB;  Service: Cardiovascular;  Laterality: N/A;   LEFT HEART CATH AND CORS/GRAFTS ANGIOGRAPHY N/A 04/09/2018   Procedure: LEFT HEART CATH AND CORS/GRAFTS ANGIOGRAPHY;  Surgeon: Marcina Millard, MD;  Location: [facility] INVASIVE CV LAB;  Service: Cardiovascular;  Laterality: N/A;   LEFT HEART CATH AND CORS/GRAFTS ANGIOGRAPHY N/A 12/10/2018   Procedure: LEFT HEART CATH AND CORS/GRAFTS ANGIOGRAPHY;  Surgeon: Marcina Millard, MD;  Location: [facility] INVASIVE CV LAB;  Service: Cardiovascular;  Laterality: N/A;   LEFT HEART CATH AND CORS/GRAFTS ANGIOGRAPHY Left 05/25/2020   Procedure: LEFT HEART CATH AND CORS/GRAFTS ANGIOGRAPHY;  Surgeon: Marcina Millard, MD;  Location: [facility] INVASIVE CV LAB;  Service: Cardiovascular;  Laterality: Left;   RIB PLATING N/A 05/25/2016   Procedure: STERNAL PLATING;  Surgeon: Loreli Slot, MD;  Location: [facility] OR;  Service: Thoracic;  Laterality:  N/A;   right shoulder     STERNAL WIRES REMOVAL N/A 03/30/2016   Procedure: STERNAL WIRES REMOVAL;  Surgeon: Loreli Slot, MD;  Location: [facility] OR;  Service: Thoracic;  Laterality: N/A;   TONSILLECTOMY      Allergies  Allergen Reactions   Contrast Media [Iodinated Contrast Media] Hives   Metrizamide Hives   Oxycodone Hcl Itching   Vicodin [Hydrocodone-Acetaminophen] Itching    Outpatient Encounter Medications as of 04/06/2023  Medication Sig   carbamide peroxide (DEBROX) 6.5 % OTIC solution Place 5 drops into both ears 2 (two) times daily.   cyanocobalamin 1000 MCG tablet Take 1 tablet (1,000 mcg total) by mouth daily.   diclofenac Sodium (VOLTAREN) 1 % GEL Apply 2 g topically 4 (four) times daily.   ELIQUIS 2.5 MG TABS tablet Take 2.5 mg by mouth 2 (two) times daily.    ezetimibe (ZETIA) 10 MG tablet Take 1 tablet (10 mg total) by mouth daily. (Patient taking differently: Take 10 mg by mouth at bedtime.)   Ipratropium-Albuterol (COMBIVENT) 20-100 MCG/ACT AERS respimat Inhale 2 puffs into the lungs 4 (four) times daily as needed for wheezing.   isosorbide mononitrate (IMDUR) 60 MG 24 hr tablet Take 60 mg by mouth 2 (two) times daily.    midodrine (PROAMATINE) 10 MG tablet Take 1 tablet (10 mg total) by mouth 3 (three) times daily with meals.   nitroGLYCERIN (NITROSTAT) 0.4 MG SL tablet Place 1 tablet (0.4 mg total) under the tongue every 5 (five) minutes as needed for chest pain.   ranolazine (RANEXA) 1000 MG SR tablet Take 1 tablet (1,000 mg total) by mouth 2 (two) times daily.   senna-docusate (SENOKOT-S) 8.6-50 MG tablet Take 1 tablet by mouth at bedtime as needed for mild constipation.   No facility-administered encounter medications on file as of 04/06/2023.    Review of Systems  Constitutional:  Negative for activity change, appetite change and fever.  HENT:  Negative for sore throat.   Eyes: Negative.   Cardiovascular:  Positive for chest pain. Negative for leg swelling.  Gastrointestinal:  Negative for abdominal distention, diarrhea and vomiting.  Genitourinary:  Negative for dysuria, frequency and urgency.  Skin:  Negative for color change.  Neurological:  Positive for dizziness. Negative for headaches.  Psychiatric/Behavioral:  Negative for behavioral problems and sleep disturbance. The patient is not nervous/anxious.        Immunization History  Administered Date(s) Administered   Influenza, High Dose Seasonal PF 10/19/2015, 11/08/2016   Influenza,inj,quad, With Preservative 11/07/2017, 11/11/2018   Influenza-Unspecified 10/23/2013, 11/04/2014, 11/01/2017   Pneumococcal Conjugate-13 12/03/2015   Tdap 06/07/2022   Pertinent  Health Maintenance Due  Topic Date Due   INFLUENZA VACCINE  04/23/2023 (Originally 08/24/2022)   Colonoscopy   08/24/2026      11/29/2020    1:14 PM 05/20/2021    2:18 PM 06/02/2021   10:24 AM 06/04/2021   10:53 AM 08/23/2021    9:37 AM  Fall Risk  (RETIRED) Patient Fall Risk Level Low fall risk Low fall risk Low fall risk High fall risk Low fall risk     Vitals:   04/06/23 1053  BP: 123/76  Pulse: (!) 53  Resp: 18  Temp: (!) 97.3 F (36.3 C)  SpO2: 98%  Weight: 171 lb (77.6 kg)  Height: 5\' 10"  (1.778 m)   Body mass index is 24.54 kg/m.  Physical Exam Constitutional:      Appearance: Normal appearance.  HENT:     Head: Normocephalic and  atraumatic.     Right Ear: There is impacted cerumen.     Left Ear: There is impacted cerumen.     Mouth/Throat:     Mouth: Mucous membranes are moist.  Eyes:     Conjunctiva/sclera: Conjunctivae normal.  Cardiovascular:     Rate and Rhythm: Normal rate and regular rhythm.     Pulses: Normal pulses.     Heart sounds: Normal heart sounds.     Comments: Has left chest heart monitor for, Cdx. Pulmonary:     Effort: Pulmonary effort is normal.     Breath sounds: Normal breath sounds.  Abdominal:     General: Bowel sounds are normal.     Palpations: Abdomen is soft.  Musculoskeletal:        General: No swelling. Normal range of motion.     Cervical back: Normal range of motion.  Skin:    General: Skin is warm and dry.  Neurological:     Mental Status: He is alert.  Psychiatric:        Mood and Affect: Mood normal.        Behavior: Behavior normal.        Labs reviewed: Recent Labs    03/28/23 0534 04/02/23 0445 04/05/23 0530  NA 138 136 136  K 4.3 4.3 4.3  CL 109 103 106  CO2 23 26 25   GLUCOSE 91 95 94  BUN 29* 25* 22  CREATININE 1.26* 1.09 1.03  CALCIUM 8.6* 9.1 8.5*   Recent Labs    09/01/22 1059 01/03/23 1340 03/27/23 1412  AST 28 19 21   ALT 21 16 17   ALKPHOS 51 47 44  BILITOT 1.0 0.7 0.9  PROT 7.2 6.7 7.5  ALBUMIN 3.5 3.5 3.7   Recent Labs    01/03/23 1340 03/27/23 1412 03/28/23 0534 04/02/23 0446  04/05/23 0530  WBC 6.0   < > 8.7 9.2 7.3  NEUTROABS 3.5  --   --   --   --   HGB 13.3   < > 13.3 16.2 15.2  HCT 40.2   < > 38.4* 47.2 43.7  MCV 96.4   < > 93.7 92.5 94.0  PLT 214   < > 199 234 182   < > = values in this interval not displayed.   Lab Results  Component Value Date   TSH 3.114 03/29/2023   Lab Results  Component Value Date   HGBA1C 5.9 (H) 11/07/2016   Lab Results  Component Value Date   CHOL 128 10/03/2017   HDL 46 10/03/2017   LDLCALC 68 10/03/2017   TRIG 70 10/03/2017   CHOLHDL 2.8 10/03/2017    Significant Diagnostic Results in last 30 days:  MR BRAIN/IAC W WO CONTRAST Result Date: 04/05/2023 CLINICAL DATA:  Dizziness EXAM: MRI HEAD WITHOUT AND WITH CONTRAST TECHNIQUE: Multiplanar, multiecho pulse sequences of the brain and surrounding structures were obtained without and with intravenous contrast. CONTRAST:  7mL GADAVIST GADOBUTROL 1 MMOL/ML IV SOLN COMPARISON:  03/28/2023 FINDINGS: Brain: No acute infarct, mass effect or extra-axial collection. No acute or chronic hemorrhage. There is multifocal hyperintense T2-weighted signal within the white matter. Parenchymal volume and CSF spaces are normal. The midline structures are normal. There is no cerebellopontine angle mass. The cochleae and semicircular canals are normal. No focal abnormality along the course of the 7th and 8th cranial nerves. Normal porus acusticus and vestibular aqueduct bilaterally. Vascular: Normal flow voids. Skull and upper cervical spine: Normal calvarium and skull base. Visualized upper cervical  spine and soft tissues are normal. Sinuses/Orbits:No paranasal sinus fluid levels or advanced mucosal thickening. No mastoid or middle ear effusion. Normal orbits. IMPRESSION: 1. No acute intracranial abnormality. 2. Findings of chronic small vessel ischemia. 3. Normal internal auditory canal structures. Electronically Signed   By: Deatra Robinson M.D.   On: 04/05/2023 00:32   NM Myocar Multi W/Spect  W/Wall Motion / EF Result Date: 04/04/2023   Findings are consistent with prior infarction given fixed defect that is not reversible and actually perfusion appears to be better during stress. Fixed perfusion defect in the apical and inferoapical region, Also with fixed perfusion defect in the basal inferior,inferolateral and lateral region The study is intermediate risk.   LV perfusion is abnormal. There is no evidence of ischemia. There is evidence of prior infarction given fixed defect. Fixed perfusion defect in the apical and inferoapical region, Also with fixed perfusion defect in the basal inferior,inferolateral and lateral region   Left ventricular function is abnormal 40-45%. Global function is mildly reduced. End diastolic cavity size is normal. End systolic cavity size is normal. This study is unchanged compared to prior nuc in 2020.   DG Chest 1 View Result Date: 04/04/2023 CLINICAL DATA:  Chest pain EXAM: CHEST  1 VIEW COMPARISON:  Chest x-ray 03/27/2023 FINDINGS: Sternotomy present. The heart size and mediastinal contours are within normal limits. Both lungs are clear. The visualized skeletal structures are unremarkable. IMPRESSION: No active disease. Electronically Signed   By: Darliss Cheney M.D.   On: 04/04/2023 00:49   MR CERVICAL SPINE WO CONTRAST Result Date: 03/29/2023 CLINICAL DATA:  Anterior cord syndrome.  Stroke-like symptoms. EXAM: MRI CERVICAL SPINE WITHOUT CONTRAST TECHNIQUE: Multiplanar, multisequence MR imaging of the cervical spine was performed. No intravenous contrast was administered. COMPARISON:  MRI cervical spine 12/08/2009.  Neck MRA 06/04/2021. FINDINGS: Technical note: Despite efforts by the technologist and patient, mild motion artifact is present on today's exam and could not be eliminated. This reduces exam sensitivity and specificity. Alignment: Reversal of the usual cervical lordosis with a mildly increased degenerative anterolisthesis at the C2-3 and C3-4 levels  compared with remote cervical MRI. Minimal retrolisthesis at C5-6. Vertebrae: No evidence of acute fracture or traumatic subluxation. Progressive multilevel spondylosis with endplate osteophytes and facet hypertrophy. Cord: Progressive multilevel spinal stenosis without significant cord deformity. No definite abnormal cord signal. Posterior Fossa, vertebral arteries, paraspinal tissues: Visualized portions of the posterior fossa appear unremarkable.Dominant left vertebral artery with preserved flow voids bilaterally. No significant paraspinal findings. Disc levels: C2-3: Preserved disc height. Progressive facet hypertrophy and anterolisthesis with resulting effacement of the CSF surrounding the cord. No significant cord deformity. Mild foraminal narrowing bilaterally. C3-4: Progressive loss of disc height with disc bulging, uncinate spurring and asymmetric facet hypertrophy on the left. Effacement of the CSF surrounding the cord without cord deformity. Moderate to severe foraminal narrowing bilaterally. C4-5: Progressive spondylosis with loss of disc height and posterior osteophytes covering diffusely bulging disc material. Mild spinal stenosis without cord deformity. Chronic mild right and moderate left foraminal narrowing, similar to previous MRI. C5-6: Chronic spondylosis with loss of disc height and posterior osteophytes covering diffusely bulging disc material. Similar chronic effacement of the CSF surrounding the cord, asymmetric to the right. No significant cord deformity. Severe right and moderate left foraminal narrowing appears progressive. C6-7: Chronic spondylosis with loss of disc height and posterior osteophytes covering diffusely bulging disc material. Mild spinal stenosis without cord deformity. Similar mild right and moderate left foraminal narrowing. C7-T1: Progressive loss  of disc height with annular disc bulging, endplate osteophytes and bilateral facet hypertrophy, worse on the right. No  significant central spinal stenosis. There is progressive foraminal narrowing, moderate on the right and mild on the left. IMPRESSION: 1. No acute findings or clear explanation for the patient's symptoms. 2. Progressive multilevel cervical spondylosis compared with remote MRI from 2011. There is resulting multilevel spinal stenosis without significant cord deformity or abnormal cord signal. 3. Multilevel foraminal narrowing, most severe bilaterally at C3-4 and on the right at C5-6. Electronically Signed   By: Carey Bullocks M.D.   On: 03/29/2023 16:55   US Carotid Bilateral Result Date: 03/29/2023 CLINICAL DATA:  83 year old male with stroke EXAM: BILATERAL CAROTID DUPLEX ULTRASOUND TECHNIQUE: Wallace Cullens scale imaging, color Doppler and duplex ultrasound were performed of bilateral carotid and vertebral arteries in the neck. COMPARISON:  None Available. FINDINGS: Criteria: Quantification of carotid stenosis is based on velocity parameters that correlate the residual internal carotid diameter with NASCET-based stenosis levels, using the diameter of the distal internal carotid lumen as the denominator for stenosis measurement. The following velocity measurements were obtained: RIGHT ICA:  Systolic 76 cm/sec, Diastolic 17 cm/sec CCA:  67 cm/sec SYSTOLIC ICA/CCA RATIO:  1.1 ECA:  97 cm/sec LEFT ICA:  Systolic 77 cm/sec, Diastolic 11 cm/sec CCA:  103 cm/sec SYSTOLIC ICA/CCA RATIO:  0.7 ECA:  95 cm/sec Right Brachial SBP: Not acquired Left Brachial SBP: Not acquired RIGHT CAROTID ARTERY: No significant calcifications of the right common carotid artery. Intermediate waveform maintained. Heterogeneous and partially calcified plaque at the right carotid bifurcation. No significant lumen shadowing. Low resistance waveform of the right ICA. No significant tortuosity. RIGHT VERTEBRAL ARTERY: Antegrade flow with low resistance waveform. LEFT CAROTID ARTERY: No significant calcifications of the left common carotid artery.  Intermediate waveform maintained. Heterogeneous and partially calcified plaque at the left carotid bifurcation. No significant lumen shadowing. Low resistance waveform of the left ICA. No significant tortuosity. LEFT VERTEBRAL ARTERY:  Antegrade flow with low resistance waveform. IMPRESSION: Color duplex indicates minimal heterogeneous and calcified plaque, with no hemodynamically significant stenosis by duplex criteria in the extracranial cerebrovascular circulation. Signed, Yvone Neu. Miachel Roux, RPVI [facility] Electronically Signed   By: Gilmer Mor D.O.   On: 03/29/2023 16:54   EEG adult Result Date: 03/29/2023 Charlsie Quest, MD     03/29/2023  4:22 PM Patient Name: GENTLE HOGE MRN: [id number] Epilepsy Attending: Charlsie Quest Referring Physician/Provider: Pennie Banter, DO Date: 03/29/2023 Duration: 31.39 mins Patient history: 82yo m with syncope. EEG to evaluate for seizure Level of alertness: Awake, asleep AEDs during EEG study: None Technical aspects: This EEG study was done with scalp electrodes positioned according to the 10-20 International system of electrode placement. Electrical activity was reviewed with band pass filter of 1-70Hz , sensitivity of 7 uV/mm, display speed of 31mm/sec with a 60Hz  notched filter applied as appropriate. EEG data were recorded continuously and digitally stored.  Video monitoring was available and reviewed as appropriate. Description: The posterior dominant rhythm consists of 8-9 Hz activity of moderate voltage (25-35 uV) seen predominantly in posterior head regions, symmetric and reactive to eye opening and eye closing. Sleep was characterized by vertex waves, sleep spindles (12 to 14 Hz), maximal frontocentral region.  There is an excessive amount of 15 to 18 Hz, 2-3 uV beta activity Hyperventilation and photic stimulation were not performed.   IMPRESSION: This study is within normal limits. No  seizures or epileptiform discharges  were seen throughout the recording. A normal interictal EEG does not exclude the diagnosis of epilepsy. Charlsie Quest   MR BRAIN WO CONTRAST Result Date: 03/28/2023 CLINICAL DATA:  Provided history: Headache, new onset. Persistent dizziness. EXAM: MRI HEAD WITHOUT CONTRAST TECHNIQUE: Multiplanar, multiecho pulse sequences of the brain and surrounding structures were obtained without intravenous contrast. COMPARISON:  Head CT 03/27/2023. FINDINGS: Intermittently motion degraded examination. Most notably, the axial T2 and axial FLAIR sequences are moderately motion degraded. Within this limitation, findings are as follows. Brain: Mild generalized cerebral atrophy. Multifocal T2 FLAIR hyperintense signal abnormality within the cerebral white matter, nonspecific but compatible with mild chronic small vessel ischemic disease. Punctate chronic microhemorrhage within the left cerebellar hemisphere (series 10, image 14). No cortical encephalomalacia is identified. There is no acute infarct. No evidence of an intracranial mass. No extra-axial fluid collection. No midline shift. Vascular: Maintained flow voids within the proximal large arterial vessels. Skull and upper cervical spine: No focal worrisome marrow lesion. Incompletely assessed cervical spondylosis. At C3-C4, a central disc protrusion contributes to apparent moderate spinal canal stenosis (with mild flattening of the ventral spinal cord). 3 mm C2-C3 grade 1 anterolisthesis. Sinuses/Orbits: No mass or acute finding within the imaged orbits. Prior bilateral ocular lens replacement. Minimal mucosal thickening scattered within bilateral ethmoid air cells. IMPRESSION: 1. Intermittently motion degraded examination. 2. No evidence of an acute intracranial abnormality. The diffusion-weighted imaging is of good quality and there is no evidence of an acute infarct. 3. Mild chronic small vessel ischemic changes within the cerebral  white matter. 4. Mild generalized cerebral atrophy. 5. Minor ethmoid sinus mucosal thickening bilaterally. 6. Incompletely assessed cervical spondylosis. At C3-C4, a central disc protrusion contributes to apparent moderate spinal canal stenosis (with mild flattening of the ventral spinal cord). Electronically Signed   By: Jackey Loge D.O.   On: 03/28/2023 14:12   ECHOCARDIOGRAM COMPLETE Result Date: 03/28/2023    ECHOCARDIOGRAM REPORT   Patient Name:   Scott [name] Date of Exam: 03/27/2023 Medical Rec #:  [id number]      Height:       70.0 in Accession #:    [id number]     Weight:       176.1 lb Date of Birth:  Jun 05, 1940       BSA:          1.978 m Patient Age:    82 years       BP:           127/63 mmHg Patient Gender: M              HR:           57 bpm. Exam Location:  [facility] Procedure: 2D Echo, Cardiac Doppler and Color Doppler (Both Spectral and Color            Flow Doppler were utilized during procedure). Indications:     R55 Syncope  History:         Patient has prior history of Echocardiogram examinations, most                  recent 03/21/2019. Cardiomyopathy, CAD, Signs/Symptoms:Dyspnea                  and Syncope; Risk Factors:Hypertension and Dyslipidemia.                  Pulmonary Emboli.  Sonographer:     Daphine Deutscher RDCS Referring Phys:  [id number] AMY N [facility] Diagnosing  Phys: Alwyn Pea MD IMPRESSIONS  1. Left ventricular ejection fraction, by estimation, is 40 to 45%. The left ventricle has normal function. The left ventricle demonstrates global hypokinesis. Left ventricular diastolic parameters are consistent with Grade III diastolic dysfunction (restrictive).  2. Right ventricular systolic function is normal. The right ventricular size is normal.  3. Left atrial size was moderately dilated.  4. Right atrial size was moderately dilated.  5. The mitral valve is normal in structure. Mild mitral valve regurgitation.  6. The aortic valve is normal in structure. Aortic valve  regurgitation is not visualized. FINDINGS  Left Ventricle: Left ventricular ejection fraction, by estimation, is 40 to 45%. The left ventricle has normal function. The left ventricle demonstrates global hypokinesis. The left ventricular internal cavity size was normal in size. There is no left ventricular hypertrophy. Left ventricular diastolic parameters are consistent with Grade III diastolic dysfunction (restrictive). Right Ventricle: The right ventricular size is normal. No increase in right ventricular wall thickness. Right ventricular systolic function is normal. Left Atrium: Left atrial size was moderately dilated. Right Atrium: Right atrial size was moderately dilated. Pericardium: There is no evidence of pericardial effusion. Mitral Valve: The mitral valve is normal in structure. Mild mitral valve regurgitation. Tricuspid Valve: The tricuspid valve is normal in structure. Tricuspid valve regurgitation is mild. Aortic Valve: The aortic valve is normal in structure. Aortic valve regurgitation is not visualized. Pulmonic Valve: The pulmonic valve was normal in structure. Pulmonic valve regurgitation is not visualized. Aorta: The ascending aorta was not well visualized. IAS/Shunts: No atrial level shunt detected by color flow Doppler.  LEFT VENTRICLE PLAX 2D LVIDd:         5.30 cm   Diastology LVIDs:         4.80 cm   LV e' medial:    5.60 cm/s LV PW:         1.10 cm   LV E/e' medial:  16.8 LV IVS:        1.10 cm   LV e' lateral:   10.30 cm/s LVOT diam:     2.20 cm   LV E/e' lateral: 9.1 LV SV:         59 LV SV Index:   30 LVOT Area:     3.80 cm  RIGHT VENTRICLE            IVC RV Basal diam:  4.30 cm    IVC diam: 1.70 cm RV S prime:     9.20 cm/s TAPSE (M-mode): 2.2 cm LEFT ATRIUM           Index        RIGHT ATRIUM           Index LA diam:      4.90 cm 2.48 cm/m   RA Area:     18.50 cm LA Vol (A2C): 23.0 ml 11.63 ml/m  RA Volume:   51.30 ml  25.94 ml/m LA Vol (A4C): 44.5 ml 22.50 ml/m  AORTIC VALVE LVOT  Vmax:   62.20 cm/s LVOT Vmean:  41.600 cm/s LVOT VTI:    0.156 m  AORTA Ao Root diam: 3.30 cm Ao Asc diam:  3.90 cm MITRAL VALVE MV Area (PHT): 3.78 cm    SHUNTS MV Decel Time: 201 msec    Systemic VTI:  0.16 m MV E velocity: 93.80 cm/s  Systemic Diam: 2.20 cm MV A velocity: 47.95 cm/s MV E/A ratio:  1.96 Dwayne Salome Arnt MD Electronically signed by Bobbie Stack  Callwood MD Signature Date/Time: 03/28/2023/12:41:21 PM    Final    CT HEAD WO CONTRAST ( ) Result Date: 03/28/2023 CLINICAL DATA:  Neuro deficit, acute, stroke suspected Headache, neuro deficit Headache, new onset (Age >= 51y) EXAM: CT HEAD WITHOUT CONTRAST TECHNIQUE: Contiguous axial images were obtained from the base of the skull through the vertex without intravenous contrast. RADIATION DOSE REDUCTION: This exam was performed according to the departmental dose-optimization program which includes automated exposure control, adjustment of the mA and/or kV according to patient size and/or use of iterative reconstruction technique. COMPARISON:  MRI head and CT head Jun 04, 2021. FINDINGS: Brain: No evidence of acute infarction, hemorrhage, hydrocephalus, extra-axial collection or mass lesion/mass effect. Vascular: No hyperdense vessel. Skull: No acute fracture. Sinuses/Orbits: Clear sinuses.  No acute orbital findings. Other: No mastoid effusions. IMPRESSION: No evidence of acute intracranial abnormality. Electronically Signed   By: Feliberto Harts M.D.   On: 03/28/2023 03:32   Portable Chest 1 View Result Date: 03/27/2023 CLINICAL DATA:  Dizziness with chest pain, initial encounter EXAM: PORTABLE CHEST 1 VIEW COMPARISON:  12/08/2022 FINDINGS: Postsurgical changes are again noted. Cardiac shadow is mildly prominent but accentuated by the portable technique. Lungs are well aerated bilaterally. No focal infiltrate or effusion is seen. No bony abnormality is noted. Mild interstitial changes are again seen. IMPRESSION: No active disease. Electronically  Signed   By: Alcide Clever M.D.   On: 03/27/2023 22:26    Assessment/Plan  1. Orthostatic hypotension (Primary) - continue midodrine 10 mg 3 times daily -Continue bilateral lower extremity compression socks while awake and off at bedtime -   Monitor BPs  2. Cardiac angina (HCC) -Continue Ranexa 1000 mg SR 1 tab p.o. twice a day and NTG as needed -Follow-up with cardiology for  3. PAF (paroxysmal atrial fibrillation) (HCC) -   Rate controlled -   Continue Eliquis for anticoagulation  4. Bilateral impacted cerumen -  Continue Debrox otic drops  5. Physical deconditioning -   Continue PT and OT, for therapeutic strengthening exercises -    Fall precautions    Family/ staff Communication: Discussed plan of care with resident, wife (at bedside) and charge nurse.  Labs/tests ordered: None    Kenard Gower, DNP, MSN, FNP-BC [facility] [phone] (Monday-Friday 8:00 a.m. - 5:00 p.m.) [phone] (after hours)

## 2023-04-09 ENCOUNTER — Encounter: Payer: Self-pay | Admitting: Student

## 2023-04-09 ENCOUNTER — Non-Acute Institutional Stay (SKILLED_NURSING_FACILITY): Payer: Self-pay | Admitting: Student

## 2023-04-09 DIAGNOSIS — I2510 Atherosclerotic heart disease of native coronary artery without angina pectoris: Secondary | ICD-10-CM | POA: Diagnosis not present

## 2023-04-09 DIAGNOSIS — I480 Paroxysmal atrial fibrillation: Secondary | ICD-10-CM

## 2023-04-09 DIAGNOSIS — I25709 Atherosclerosis of coronary artery bypass graft(s), unspecified, with unspecified angina pectoris: Secondary | ICD-10-CM

## 2023-04-09 DIAGNOSIS — I5032 Chronic diastolic (congestive) heart failure: Secondary | ICD-10-CM | POA: Diagnosis not present

## 2023-04-09 DIAGNOSIS — I951 Orthostatic hypotension: Secondary | ICD-10-CM

## 2023-04-09 DIAGNOSIS — I2583 Coronary atherosclerosis due to lipid rich plaque: Secondary | ICD-10-CM

## 2023-04-09 LAB — CBC AND DIFFERENTIAL
HCT: 45 (ref 41–53)
Hemoglobin: 14.8 (ref 13.5–17.5)
Neutrophils Absolute: 3219
Platelets: 177 10*3/uL (ref 150–400)
WBC: 5.8

## 2023-04-09 LAB — BASIC METABOLIC PANEL
BUN: 18 (ref 4–21)
CO2: 25 — AB (ref 13–22)
Chloride: 104 (ref 99–108)
Creatinine: 1.2 (ref 0.6–1.3)
Glucose: 83
Potassium: 4.3 meq/L (ref 3.5–5.1)
Sodium: 136 — AB (ref 137–147)

## 2023-04-09 LAB — COMPREHENSIVE METABOLIC PANEL
Calcium: 8.6 — AB (ref 8.7–10.7)
eGFR: 60

## 2023-04-09 LAB — CBC: RBC: 4.61 (ref 3.87–5.11)

## 2023-04-09 NOTE — Progress Notes (Signed)
 Provider:  Sydnee Cabal Location:  Other Nursing Home Room Number: [place] [address] Place of Service:  SNF ([phone])  PCP: [name], MD Patient Care Team: [name], MD as PCP - General (Internal Medicine) Mariah Milling Tollie Pizza, MD as PCP - Cardiology (Cardiology) Rafael Bihari, MD (Internal Medicine) Delma Freeze, FNP as Nurse Practitioner (Cardiology) Antonieta Iba, MD as Consulting Physician (Cardiology) Rickard Patience, MD as Consulting Physician (Oncology)  Extended Emergency Contact Information Primary Emergency Contact: Scott,[name] Address: [address]          [place], [place] [address] Scott [name] of [name] Phone: [phone] Mobile Phone: [phone] Relation: Spouse Secondary Emergency Contact: Donalee Citrin Address: [address]          [place], [place] [address] Scott [name] of [place] Home Phone: [phone] Mobile Phone: [phone] Relation: Daughter  Code Status: Full Code Goals of Care: Advanced Directive information    03/27/2023    9:52 PM  Advanced Directives  Does Patient Have a Medical Advance Directive? No  Type of Estate agent of [place];Living will  Copy of Healthcare Power of Attorney in Chart? No - copy requested  Would patient like information on creating a medical advance directive? No - Patient declined      Chief Complaint  Patient presents with   Admission    HPI: Patient is a 83 y.o. male seen today for admission to Discussed the use of AI scribe software for clinical note transcription with the patient, who gave verbal consent to proceed.  History of Present Illness The patient, with coronary artery disease, presents with dizziness and orthostatic hypotension.  He initially experienced severe dizziness a week prior to admission, which was so intense that he was unable to stand and felt 'drunk all over the yard' before reaching his house. Although the dizziness has lessened since  admission, it persists to some extent. He has a history of occasional dizzy spells lasting only seconds, but the recent episode was more severe and prolonged. During his hospital stay, imaging of his brain ruled out a stroke. His heart rate and blood pressure were noted to be low, contributing to his symptoms.  He experiences chest pain and shortness of breath, particularly during physical activity like working in the yard. These symptoms have been present for some time. He uses nitroglycerin for chest pain, which he has had for a couple of years, but uses it infrequently as the pains are short-lived. He has not taken nitrostat during his current stay, as his chest pain is not sustained and comes and goes. A heart study indicated a previous heart attack, but perfusion was good, and the heart's squeeze was slightly decreased but stable since 2020.  He does not drink plenty of fluids and is unsure of his daily intake. He does not use any assistive devices at home and is independent in his activities of daily living. 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  He had a fall during a dizzy spell when he fell across a chair. He has a history of passing out several years ago, but he is unsure of the cause. He has not experienced syncope recently.  He notes swelling in his left leg and foot, which has been ongoing for a long time, but he has not had any significant injuries or  surgeries to explain this.  Past Medical History - Orthostatic hypotension - Previous heart attack  Results  Past Medical History:  Diagnosis Date   Arthritis    right hip, since a fall   CAD (coronary artery disease)    a. 04/2012 Cath: 3VD->Med Rx;  b. 04/2013 PCI RCA (2 DES); c. 03/2014 PCI: LAD 80 (3.0x23 Xience Alpine DES); d. 06/2014 CABG x 2 ([facility]) LIMA->LAD, VG->OM; e.  12/2014 Cath ([facility]): patent grafts->Med Rx; f. 08/2015 Cath: patent grafts; g. 11/2016 Cath: LM min irregs, LAD 20p, 30m, LCX 100p/m, RCA 20ost, 10p/m/d, VG->OM1 nl, LIMA->dLAD nl; f. 07/2017 MV: No isch, EF 51%.   Cardiomyopathy, ischemic    a. 04/2012 Echo: EF 40-45%;  b. 08/2013 Echo: EF 45-50%; c. 01/2015 Echo: EF 40-45%; d. 07/2016 Echo: EF 60-65%; e. 09/2017 Echo: EF 55-60%, Gr1 DD.   Carotid arterial disease (HCC)    a. 05/2013 Carotid U/S; bilat 40-50% ICA stenosis.   Chronic Chest Pain    USES NITRO   Chronic systolic CHF (congestive heart failure) (HCC)    a. 01/2015 Echo: EF 40-45%; b. 07/2016 Echo: EF 60-65%; c. 09/2017 Echo: EF 55-60%, Gr1 DD, nl RV fxn.   Cough    Diverticulosis    Dizziness    Dyspnea    WITH EXERTION   Headache 1970's   migraine   History of blood transfusion 2016   post op   Hyperlipidemia    Hypertension    Iron deficiency anemia due to chronic blood loss 09/11/2016   Myocardial infarction [facility])    unsure of when   Pulmonary emboli (HCC) 07/2016   a. On Xarelto.   Reflux esophagitis    Sternal pain    a. 03/2016 s/p Sternal wire removal; b. 05/2016 s/p redo median sternotomy for sternal debridement and sternal plating.   Syncope 04/2018   CARDIOLOGIST WAS DR Mariah Milling AND HE WAS AWARE-NOW PT SEES PARASCHOS   Tubular adenoma of colon    Past Surgical History:  Procedure Laterality Date   CARDIAC CATHETERIZATION  05/01/2012   CARDIAC CATHETERIZATION  04/2013   [facility];x3 stent   CARDIAC CATHETERIZATION  01/11/15    [facility]   CARDIAC CATHETERIZATION N/A 09/16/2015   Procedure: LEFT HEART CATH AND CORS/GRAFTS ANGIOGRAPHY;  Surgeon: Antonieta Iba, MD;  Location: [facility] INVASIVE CV LAB;  Service: Cardiovascular;  Laterality: N/A;   CARPAL TUNNEL RELEASE     right hand   CATARACT EXTRACTION     LEFT   CATARACT EXTRACTION W/PHACO Left 09/16/2014   Procedure: CATARACT EXTRACTION PHACO AND INTRAOCULAR LENS PLACEMENT (IOC);  Surgeon: Lockie Mola, MD;  Location:  [facility];  Service: Ophthalmology;  Laterality: Left;   COLONOSCOPY     COLONOSCOPY WITH PROPOFOL N/A 01/28/2016   Procedure: COLONOSCOPY WITH PROPOFOL;  Surgeon: Christena Deem, MD;  Location: [facility] ENDOSCOPY;  Service: Endoscopy;  Laterality: N/A;   COLONOSCOPY WITH PROPOFOL N/A 08/23/2021   Procedure: COLONOSCOPY WITH PROPOFOL;  Surgeon: Toney Reil, MD;  Location: [facility] ENDOSCOPY;  Service: Gastroenterology;  Laterality: N/A;  REQUESTS 9AM OR LATER   CORONARY ANGIOPLASTY WITH STENT PLACEMENT  04/13/2014   CORONARY ARTERY BYPASS GRAFT  06-26-14   x3 bypasses   CORONARY PRESSURE/FFR WITH 3D MAPPING N/A 12/10/2018   Procedure: Mellody Dance PRESSURE WIRE/FFR STUDY;  Surgeon: Marcina Millard, MD;  Location: [facility] INVASIVE CV LAB;  Service: Cardiovascular;  Laterality: N/A;   CORONARY ULTRASOUND/IVUS N/A 12/10/2018   Procedure: Intravascular Ultrasound/IVUS;  Surgeon: Marcina Millard, MD;  Location: [facility] INVASIVE CV LAB;  Service: Cardiovascular;  Laterality: N/A;   CYSTOSCOPY N/A 08/14/2018   Procedure: CYSTOSCOPY;  Surgeon: Riki Altes, MD;  Location: [facility] ORS;  Service: Urology;  Laterality: N/A;   DE QUERVAIN'S RELEASE Left 08/22/2012   ESOPHAGOGASTRODUODENOSCOPY (EGD) WITH PROPOFOL N/A 04/26/2015   Procedure: ESOPHAGOGASTRODUODENOSCOPY (EGD) WITH PROPOFOL;  Surgeon: Wallace Cullens, MD;  Location: [facility]COPY;  Service: Gastroenterology;  Laterality: N/A;   ESOPHAGOGASTRODUODENOSCOPY (EGD) WITH PROPOFOL N/A 05/29/2017   Procedure: ESOPHAGOGASTRODUODENOSCOPY (EGD) WITH PROPOFOL;  Surgeon: Christena Deem, MD;  Location: [facility] ENDOSCOPY;  Service: Endoscopy;  Laterality: N/A;   LEFT HEART CATH AND CORONARY ANGIOGRAPHY N/A 12/04/2016   Procedure: LEFT HEART CATH AND CORS/GRAFTS  ANGIOGRAPHY;  Surgeon: Iran Ouch, MD;  Location: [facility] INVASIVE CV LAB;  Service: Cardiovascular;  Laterality: N/A;   LEFT HEART CATH AND CORS/GRAFTS ANGIOGRAPHY N/A 04/09/2018   Procedure:  LEFT HEART CATH AND CORS/GRAFTS ANGIOGRAPHY;  Surgeon: Marcina Millard, MD;  Location: [facility] INVASIVE CV LAB;  Service: Cardiovascular;  Laterality: N/A;   LEFT HEART CATH AND CORS/GRAFTS ANGIOGRAPHY N/A 12/10/2018   Procedure: LEFT HEART CATH AND CORS/GRAFTS ANGIOGRAPHY;  Surgeon: Marcina Millard, MD;  Location: [facility] INVASIVE CV LAB;  Service: Cardiovascular;  Laterality: N/A;   LEFT HEART CATH AND CORS/GRAFTS ANGIOGRAPHY Left 05/25/2020   Procedure: LEFT HEART CATH AND CORS/GRAFTS ANGIOGRAPHY;  Surgeon: Marcina Millard, MD;  Location: [facility] INVASIVE CV LAB;  Service: Cardiovascular;  Laterality: Left;   RIB PLATING N/A 05/25/2016   Procedure: STERNAL PLATING;  Surgeon: Loreli Slot, MD;  Location: [facility] OR;  Service: Thoracic;  Laterality: N/A;   right shoulder     STERNAL WIRES REMOVAL N/A 03/30/2016   Procedure: STERNAL WIRES REMOVAL;  Surgeon: Loreli Slot, MD;  Location: [facility] OR;  Service: Thoracic;  Laterality: N/A;   TONSILLECTOMY      reports that he quit smoking about 16 years ago. His smoking use included cigarettes. He started smoking about 51 years ago. He has a 35 pack-year smoking history. He has quit using smokeless tobacco. He reports that he does not drink alcohol and does not use drugs. Social History   Socioeconomic History   Marital status: Married    Spouse name: Not on file   Number of children: Not on file   Years of education: Not on file   Highest education level: Not on file  Occupational History   Not on file  Tobacco Use   Smoking status: Former    Current packs/day: 0.00    Average packs/day: 1 pack/day for 35.0 years (35.0 ttl pk-yrs)    Types: Cigarettes    Start date: 1    Quit date: 2009    Years since quitting: 16.2   Smokeless tobacco: Former  Building services engineer status: Never Used  Substance and Sexual Activity   Alcohol use: No   Drug use: No   Sexual activity: Yes  Other Topics Concern   Not on file  Social History  Narrative   Not on file   Social Drivers of Health   Financial Resource Strain: Not on file  Food Insecurity: No Food Insecurity (03/27/2023)   Hunger Vital Sign    Worried About Running Out of Food in the Last Year: Never true    Ran Out of Food in the Last Year: Never true  Transportation Needs: No Transportation Needs (03/27/2023)   PRAPARE - Administrator, Civil Service (Medical): No  Lack of Transportation (Non-Medical): No  Physical Activity: Not on file  Stress: Not on file  Social Connections: Moderately Integrated (04/04/2023)   Social Connection and Isolation Panel [NHANES]    Frequency of Communication with Friends and Family: Three times a week    Frequency of Social Gatherings with Friends and Family: More than three times a week    Attends Religious Services: More than 4 times per year    Active Member of [employer] or Organizations: No    Attends Banker Meetings: Patient declined    Marital Status: Married  Catering manager Violence: Not At Risk (03/27/2023)   Humiliation, Afraid, Rape, and Kick questionnaire    Fear of Current or Ex-Partner: No    Emotionally Abused: No    Physically Abused: No    Sexually Abused: No    Functional Status Survey:    Family History  Problem Relation Age of Onset   Heart attack Father 50       MI   Cancer Maternal Uncle     Health Maintenance  Topic Date Due   COVID-19 Vaccine (1) Never done   Zoster Vaccines- Shingrix (1 of 2) Never done   Pneumonia Vaccine 51+ Years old (2 of 2 - PPSV23 or PCV20) 01/28/2016   Medicare Annual Wellness (AWV)  03/27/2018   INFLUENZA VACCINE  04/23/2023 (Originally 08/24/2022)   Colonoscopy  08/24/2026   DTaP/Tdap/Td (2 - Td or Tdap) 06/06/2032   HPV VACCINES  Aged Out   Lung Cancer Screening  Discontinued    Allergies  Allergen Reactions   Contrast Media [Iodinated Contrast Media] Hives   Metrizamide Hives   Oxycodone Hcl Itching   Vicodin  [Hydrocodone-Acetaminophen] Itching    Outpatient Encounter Medications as of 04/09/2023  Medication Sig   levalbuterol (XOPENEX) 0.63 MG/3ML nebulizer solution Inhale into the lungs.   carbamide peroxide (DEBROX) 6.5 % OTIC solution Place 5 drops into both ears 2 (two) times daily.   cyanocobalamin 1000 MCG tablet Take 1 tablet (1,000 mcg total) by mouth daily.   diclofenac Sodium (VOLTAREN) 1 % GEL Apply 2 g topically 4 (four) times daily.   ELIQUIS 2.5 MG TABS tablet Take 2.5 mg by mouth 2 (two) times daily.   ezetimibe (ZETIA) 10 MG tablet Take 1 tablet (10 mg total) by mouth daily. (Patient taking differently: Take 10 mg by mouth at bedtime.)   Ipratropium-Albuterol (COMBIVENT) 20-100 MCG/ACT AERS respimat Inhale 2 puffs into the lungs 4 (four) times daily as needed for wheezing.   isosorbide mononitrate (IMDUR) 60 MG 24 hr tablet Take 60 mg by mouth 2 (two) times daily.    midodrine (PROAMATINE) 10 MG tablet Take 1 tablet (10 mg total) by mouth 3 (three) times daily with meals.   nitroGLYCERIN (NITROSTAT) 0.4 MG SL tablet Place 1 tablet (0.4 mg total) under the tongue every 5 (five) minutes as needed for chest pain.   ranolazine (RANEXA) 1000 MG SR tablet Take 1 tablet (1,000 mg total) by mouth 2 (two) times daily.   senna-docusate (SENOKOT-S) 8.6-50 MG tablet Take 1 tablet by mouth at bedtime as needed for mild constipation.   No facility-administered encounter medications on file as of 04/09/2023.    Review of Systems  Vitals:   04/09/23 2216  BP: (!) 140/71  Pulse: 60  Resp: 18  Temp: (!) 97.4 F (36.3 C)  SpO2: 98%  Weight: 171 lb (77.6 kg)   Body mass index is 24.54 kg/m. Physical Exam Constitutional:  Appearance: Normal appearance.  Cardiovascular:     Rate and Rhythm: Normal rate and regular rhythm.     Pulses: Normal pulses.     Heart sounds: Normal heart sounds.  Pulmonary:     Effort: Pulmonary effort is normal.  Abdominal:     General: Abdomen is flat.  Bowel sounds are normal.     Palpations: Abdomen is soft.  Musculoskeletal:        General: Swelling present. No tenderness.  Skin:    General: Skin is warm and dry.  Neurological:     Mental Status: He is alert and oriented to person, place, and time.  Psychiatric:        Mood and Affect: Mood normal.     Labs reviewed: Basic Metabolic Panel: Recent Labs    03/28/23 0534 04/02/23 0445 04/05/23 0530  NA 138 136 136  K 4.3 4.3 4.3  CL 109 103 106  CO2 23 26 25   GLUCOSE 91 95 94  BUN 29* 25* 22  CREATININE 1.26* 1.09 1.03  CALCIUM 8.6* 9.1 8.5*   Liver Function Tests: Recent Labs    09/01/22 1059 01/03/23 1340 03/27/23 1412  AST 28 19 21   ALT 21 16 17   ALKPHOS 51 47 44  BILITOT 1.0 0.7 0.9  PROT 7.2 6.7 7.5  ALBUMIN 3.5 3.5 3.7   Recent Labs    09/01/22 1059  LIPASE 30   Recent Labs    03/29/23 1458  AMMONIA 31   CBC: Recent Labs    01/03/23 1340 03/27/23 1412 03/28/23 0534 04/02/23 0446 04/05/23 0530  WBC 6.0   < > 8.7 9.2 7.3  NEUTROABS 3.5  --   --   --   --   HGB 13.3   < > 13.3 16.2 15.2  HCT 40.2   < > 38.4* 47.2 43.7  MCV 96.4   < > 93.7 92.5 94.0  PLT 214   < > 199 234 182   < > = values in this interval not displayed.   Cardiac Enzymes: No results for input(s): "CKTOTAL", "CKMB", "CKMBINDEX", "TROPONINI" in the last 8760 hours. BNP: Invalid input(s): "POCBNP" Lab Results  Component Value Date   HGBA1C 5.9 (H) 11/07/2016   Lab Results  Component Value Date   TSH 3.114 03/29/2023   Lab Results  Component Value Date   VITAMINB12 142 (L) 03/29/2023   No results found for: "FOLATE" Lab Results  Component Value Date   IRON 55 01/03/2023   TIBC 382 01/03/2023   FERRITIN 9 (L) 01/03/2023   Results           Imaging and Procedures obtained prior to SNF admission: MR BRAIN WO CONTRAST Result Date: 03/28/2023 CLINICAL DATA:  Provided history: Headache, new onset. Persistent dizziness. EXAM: MRI HEAD WITHOUT CONTRAST  TECHNIQUE: Multiplanar, multiecho pulse sequences of the brain and surrounding structures were obtained without intravenous contrast. COMPARISON:  Head CT 03/27/2023. FINDINGS: Intermittently motion degraded examination. Most notably, the axial T2 and axial FLAIR sequences are moderately motion degraded. Within this limitation, findings are as follows. Brain: Mild generalized cerebral atrophy. Multifocal T2 FLAIR hyperintense signal abnormality within the cerebral white matter, nonspecific but compatible with mild chronic small vessel ischemic disease. Punctate chronic microhemorrhage within the left cerebellar hemisphere (series 10, image 14). No cortical encephalomalacia is identified. There is no acute infarct. No evidence of an intracranial mass. No extra-axial fluid collection. No midline shift. Vascular: Maintained flow voids within the proximal large arterial vessels. Skull and  upper cervical spine: No focal worrisome marrow lesion. Incompletely assessed cervical spondylosis. At C3-C4, a central disc protrusion contributes to apparent moderate spinal canal stenosis (with mild flattening of the ventral spinal cord). 3 mm C2-C3 grade 1 anterolisthesis. Sinuses/Orbits: No mass or acute finding within the imaged orbits. Prior bilateral ocular lens replacement. Minimal mucosal thickening scattered within bilateral ethmoid air cells. IMPRESSION: 1. Intermittently motion degraded examination. 2. No evidence of an acute intracranial abnormality. The diffusion-weighted imaging is of good quality and there is no evidence of an acute infarct. 3. Mild chronic small vessel ischemic changes within the cerebral white matter. 4. Mild generalized cerebral atrophy. 5. Minor ethmoid sinus mucosal thickening bilaterally. 6. Incompletely assessed cervical spondylosis. At C3-C4, a central disc protrusion contributes to apparent moderate spinal canal stenosis (with mild flattening of the ventral spinal cord). Electronically Signed    By: Jackey Loge D.O.   On: 03/28/2023 14:12   ECHOCARDIOGRAM COMPLETE Result Date: 03/28/2023    ECHOCARDIOGRAM REPORT   Patient Name:   Scott [name] Date of Exam: 03/27/2023 Medical Rec #:  [id number]      Height:       70.0 in Accession #:    [id number]     Weight:       176.1 lb Date of Birth:  11/07/1940       BSA:          1.978 m Patient Age:    82 years       BP:           127/63 mmHg Patient Gender: M              HR:           57 bpm. Exam Location:  [facility] Procedure: 2D Echo, Cardiac Doppler and Color Doppler (Both Spectral and Color            Flow Doppler were utilized during procedure). Indications:     R55 Syncope  History:         Patient has prior history of Echocardiogram examinations, most                  recent 03/21/2019. Cardiomyopathy, CAD, Signs/Symptoms:Dyspnea                  and Syncope; Risk Factors:Hypertension and Dyslipidemia.                  Pulmonary Emboli.  Sonographer:     Daphine Deutscher RDCS Referring Phys:  [id number] AMY N COX Diagnosing Phys: Alwyn Pea MD IMPRESSIONS  1. Left ventricular ejection fraction, by estimation, is 40 to 45%. The left ventricle has normal function. The left ventricle demonstrates global hypokinesis. Left ventricular diastolic parameters are consistent with Grade III diastolic dysfunction (restrictive).  2. Right ventricular systolic function is normal. The right ventricular size is normal.  3. Left atrial size was moderately dilated.  4. Right atrial size was moderately dilated.  5. The mitral valve is normal in structure. Mild mitral valve regurgitation.  6. The aortic valve is normal in structure. Aortic valve regurgitation is not visualized. FINDINGS  Left Ventricle: Left ventricular ejection fraction, by estimation, is 40 to 45%. The left ventricle has normal function. The left ventricle demonstrates global hypokinesis. The left ventricular internal cavity size was normal in size. There is no left ventricular hypertrophy. Left  ventricular diastolic parameters are consistent with Grade III diastolic dysfunction (restrictive). Right Ventricle: The right ventricular size is  normal. No increase in right ventricular wall thickness. Right ventricular systolic function is normal. Left Atrium: Left atrial size was moderately dilated. Right Atrium: Right atrial size was moderately dilated. Pericardium: There is no evidence of pericardial effusion. Mitral Valve: The mitral valve is normal in structure. Mild mitral valve regurgitation. Tricuspid Valve: The tricuspid valve is normal in structure. Tricuspid valve regurgitation is mild. Aortic Valve: The aortic valve is normal in structure. Aortic valve regurgitation is not visualized. Pulmonic Valve: The pulmonic valve was normal in structure. Pulmonic valve regurgitation is not visualized. Aorta: The ascending aorta was not well visualized. IAS/Shunts: No atrial level shunt detected by color flow Doppler.  LEFT VENTRICLE PLAX 2D LVIDd:         5.30 cm   Diastology LVIDs:         4.80 cm   LV e' medial:    5.60 cm/s LV PW:         1.10 cm   LV E/e' medial:  16.8 LV IVS:        1.10 cm   LV e' lateral:   10.30 cm/s LVOT diam:     2.20 cm   LV E/e' lateral: 9.1 LV SV:         59 LV SV Index:   30 LVOT Area:     3.80 cm  RIGHT VENTRICLE            IVC RV Basal diam:  4.30 cm    IVC diam: 1.70 cm RV S prime:     9.20 cm/s TAPSE (M-mode): 2.2 cm LEFT ATRIUM           Index        RIGHT ATRIUM           Index LA diam:      4.90 cm 2.48 cm/m   RA Area:     18.50 cm LA Vol (A2C): 23.0 ml 11.63 ml/m  RA Volume:   51.30 ml  25.94 ml/m LA Vol (A4C): 44.5 ml 22.50 ml/m  AORTIC VALVE LVOT Vmax:   62.20 cm/s LVOT Vmean:  41.600 cm/s LVOT VTI:    0.156 m  AORTA Ao Root diam: 3.30 cm Ao Asc diam:  3.90 cm MITRAL VALVE MV Area (PHT): 3.78 cm    SHUNTS MV Decel Time: 201 msec    Systemic VTI:  0.16 m MV E velocity: 93.80 cm/s  Systemic Diam: 2.20 cm MV A velocity: 47.95 cm/s MV E/A ratio:  1.96 Dwayne D Callwood  MD Electronically signed by Alwyn Pea MD Signature Date/Time: 03/28/2023/12:41:21 PM    Final    CT HEAD WO CONTRAST ( ) Result Date: 03/28/2023 CLINICAL DATA:  Neuro deficit, acute, stroke suspected Headache, neuro deficit Headache, new onset (Age >= 51y) EXAM: CT HEAD WITHOUT CONTRAST TECHNIQUE: Contiguous axial images were obtained from the base of the skull through the vertex without intravenous contrast. RADIATION DOSE REDUCTION: This exam was performed according to the departmental dose-optimization program which includes automated exposure control, adjustment of the mA and/or kV according to patient size and/or use of iterative reconstruction technique. COMPARISON:  MRI head and CT head Jun 04, 2021. FINDINGS: Brain: No evidence of acute infarction, hemorrhage, hydrocephalus, extra-axial collection or mass lesion/mass effect. Vascular: No hyperdense vessel. Skull: No acute fracture. Sinuses/Orbits: Clear sinuses.  No acute orbital findings. Other: No mastoid effusions. IMPRESSION: No evidence of acute intracranial abnormality. Electronically Signed   By: Feliberto Harts M.D.   On: 03/28/2023 03:32  Portable Chest 1 View Result Date: 03/27/2023 CLINICAL DATA:  Dizziness with chest pain, initial encounter EXAM: PORTABLE CHEST 1 VIEW COMPARISON:  12/08/2022 FINDINGS: Postsurgical changes are again noted. Cardiac shadow is mildly prominent but accentuated by the portable technique. Lungs are well aerated bilaterally. No focal infiltrate or effusion is seen. No bony abnormality is noted. Mild interstitial changes are again seen. IMPRESSION: No active disease. Electronically Signed   By: Alcide Clever M.D.   On: 03/27/2023 22:26   RADIOLOGY Brain MRI: No acute intracranial abnormalities (04/05/2023) Chest X-ray: No active disease (04/05/2023) Carotid Ultrasound: No significant stenosis (04/05/2023)  DIAGNOSTIC Echocardiography: Previous myocardial infarction, good perfusion, ejection fraction  40-45% (04/05/2023)  Assessment/Plan Orthostatic Hypotension Admitted with orthostatic hypotension, presenting with dizziness and hypotension for one week. Imaging ruled out stroke. Bradycardia and hypotension contributed to symptoms. Initiated midodrine to elevate blood pressure. Educated on hydration and compression stockings. Discussed abdominal binder as a potential aid. Blood pressure significantly drops during physical therapy, necessitating close monitoring and potential treatment adjustment. - Continue midodrine 10 mg at 8 AM, 2 PM, and 8 PM - Encourage hydration with at least three cups of water per day - Continue use of compression stockings - Consider abdominal binder if symptoms persist - Monitor blood pressure and symptoms regularly - Adjust midodrine dosage if necessary based on blood pressure readings  Dehydration Initial labs indicated dehydration. Emphasized hydration as part of orthostatic hypotension management to prevent dizziness and hypotension. 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 Previous myocardial infarction with ejection fraction at 40-45%. Uses nitroglycerin for infrequent, short-lived angina. Heart monitor in place. Imaging and studies show good perfusion despite reduced ejection fraction. 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 No history of injury or joint replacement. - Monitor for changes in swelling or development of symptoms  Goals of Care Discussed code status and hospitalization preferences. Prefers full resuscitation and undecided on future hospitalizations, opting to decide situationally. Emphasized discussing preferences with family, especially if unresponsive. -  Document code status as full resuscitation - Discuss hospitalization preferences with family if needed    Family/ staff Communication: nursing, spouse  Labs/tests ordered: BMP, CBC  I spent greater than 45 minutes for the care of this patient in face to face time, chart review, clinical documentation, patient education.

## 2023-04-10 ENCOUNTER — Encounter: Payer: Self-pay | Admitting: Oncology

## 2023-04-12 ENCOUNTER — Encounter: Payer: Self-pay | Admitting: Nurse Practitioner

## 2023-04-12 ENCOUNTER — Non-Acute Institutional Stay (SKILLED_NURSING_FACILITY): Payer: Self-pay | Admitting: Nurse Practitioner

## 2023-04-12 DIAGNOSIS — R079 Chest pain, unspecified: Secondary | ICD-10-CM

## 2023-04-12 DIAGNOSIS — I25709 Atherosclerosis of coronary artery bypass graft(s), unspecified, with unspecified angina pectoris: Secondary | ICD-10-CM

## 2023-04-12 NOTE — Progress Notes (Deleted)
 Location:  Other [facility] [place].  Nursing Home Room Number: [facility] 112A Place of Service:  SNF [phone]) Abbey Chatters, NP  PCP: [name], MD  Patient Care Team: [name], MD as PCP - General (Internal Medicine) Mariah Milling Tollie Pizza, MD as PCP - Cardiology (Cardiology) Rafael Bihari, MD (Internal Medicine) Delma Freeze, FNP as Nurse Practitioner (Cardiology) Antonieta Iba, MD as Consulting Physician (Cardiology) Rickard Patience, MD as Consulting Physician (Oncology)  Extended Emergency Contact Information Primary Emergency Contact: Petrik,[name] Address: [address]          [place], [place] [address] [name] of [place] Home Phone: [phone] Mobile Phone: [phone] Relation: Spouse Secondary Emergency Contact: Donalee Citrin Address: [address]          [place], [place] [address] [name] of [place] Home Phone: [phone] Mobile Phone: [phone] Relation: Daughter  Goals of care: Advanced Directive information    03/27/2023    9:52 PM  Advanced Directives  Does Patient Have a Medical Advance Directive? No  Type of Estate agent of [place];Living will  Copy of Healthcare Power of Attorney in Chart? No - copy requested  Would patient like information on creating a medical advance directive? No - Patient declined     Chief Complaint  Patient presents with   Chest Pain    Chest Pain.     HPI:  Pt is a 83 y.o. male seen today for an acute visit for Chest Pain.    Past Medical History:  Diagnosis Date   Arthritis    right hip, since a fall   CAD (coronary artery disease)    a. 04/2012 Cath: 3VD->Med Rx;  b. 04/2013 PCI RCA (2 DES); c. 03/2014 PCI: LAD 80 (3.0x23 Xience Alpine DES); d. 06/2014 CABG x 2 ([facility]) LIMA->LAD, VG->OM; e. 12/2014 Cath ([facility]): patent grafts->Med Rx; f. 08/2015 Cath: patent grafts; g. 11/2016 Cath: LM min irregs, LAD 20p, 16m, LCX 100p/m, RCA 20ost, 10p/m/d, VG->OM1 nl, LIMA->dLAD nl;  f. 07/2017 MV: No isch, EF 51%.   Cardiomyopathy, ischemic    a. 04/2012 Echo: EF 40-45%;  b. 08/2013 Echo: EF 45-50%; c. 01/2015 Echo: EF 40-45%; d. 07/2016 Echo: EF 60-65%; e. 09/2017 Echo: EF 55-60%, Gr1 DD.   Carotid arterial disease (HCC)    a. 05/2013 Carotid U/S; bilat 40-50% ICA stenosis.   Chronic Chest Pain    USES NITRO   Chronic systolic CHF (congestive heart failure) (HCC)    a. 01/2015 Echo: EF 40-45%; b. 07/2016 Echo: EF 60-65%; c. 09/2017 Echo: EF 55-60%, Gr1 DD, nl RV fxn.   Cough    Diverticulosis    Dizziness    Dyspnea    WITH EXERTION   Headache 1970's   migraine   History of blood transfusion 2016   post op   Hyperlipidemia    Hypertension    Iron deficiency anemia due to chronic blood loss 09/11/2016   Myocardial infarction [facility])    unsure of when   Pulmonary emboli (HCC) 07/2016   a. On Xarelto.   Reflux esophagitis    Sternal pain    a. 03/2016 s/p Sternal wire removal; b. 05/2016 s/p redo median sternotomy for sternal debridement and sternal plating.   Syncope 04/2018   CARDIOLOGIST WAS DR Mariah Milling AND HE WAS AWARE-NOW PT SEES PARASCHOS   Tubular adenoma of colon    Past Surgical History:  Procedure Laterality Date   CARDIAC CATHETERIZATION  05/01/2012   CARDIAC CATHETERIZATION  04/2013   [facility];x3 stent   CARDIAC CATHETERIZATION  01/11/15    [facility]   CARDIAC CATHETERIZATION N/A 09/16/2015   Procedure: LEFT HEART CATH AND CORS/GRAFTS ANGIOGRAPHY;  Surgeon: Antonieta Iba, MD;  Location: [facility] INVASIVE CV LAB;  Service: Cardiovascular;  Laterality: N/A;   CARPAL TUNNEL RELEASE     right hand   CATARACT EXTRACTION     LEFT   CATARACT EXTRACTION W/PHACO Left 09/16/2014   Procedure: CATARACT EXTRACTION PHACO AND INTRAOCULAR LENS PLACEMENT (IOC);  Surgeon: Lockie Mola, MD;  Location: [facility];  Service: Ophthalmology;  Laterality: Left;   COLONOSCOPY     COLONOSCOPY WITH PROPOFOL N/A 01/28/2016   Procedure: COLONOSCOPY WITH PROPOFOL;  Surgeon: Christena Deem, MD;  Location: [facility] ENDOSCOPY;  Service: Endoscopy;  Laterality: N/A;   COLONOSCOPY WITH PROPOFOL N/A 08/23/2021   Procedure: COLONOSCOPY WITH PROPOFOL;  Surgeon: Toney Reil, MD;  Location: [facility] ENDOSCOPY;  Service: Gastroenterology;  Laterality: N/A;  REQUESTS 9AM OR LATER   CORONARY ANGIOPLASTY WITH STENT PLACEMENT  04/13/2014   CORONARY ARTERY BYPASS GRAFT  06-26-14   x3 bypasses   CORONARY PRESSURE/FFR WITH 3D MAPPING N/A 12/10/2018   Procedure: Mellody Dance PRESSURE WIRE/FFR STUDY;  Surgeon: Marcina Millard, MD;  Location: [facility] INVASIVE CV LAB;  Service: Cardiovascular;  Laterality: N/A;   CORONARY ULTRASOUND/IVUS N/A 12/10/2018   Procedure: Intravascular Ultrasound/IVUS;  Surgeon: Marcina Millard, MD;  Location: [facility] INVASIVE CV LAB;  Service: Cardiovascular;  Laterality: N/A;   CYSTOSCOPY N/A 08/14/2018   Procedure: CYSTOSCOPY;  Surgeon: Riki Altes, MD;  Location: [facility] ORS;  Service: Urology;  Laterality: N/A;   DE QUERVAIN'S RELEASE Left 08/22/2012   ESOPHAGOGASTRODUODENOSCOPY (EGD) WITH PROPOFOL N/A 04/26/2015   Procedure: ESOPHAGOGASTRODUODENOSCOPY (EGD) WITH PROPOFOL;  Surgeon: Wallace Cullens, MD;  Location: [facility] ENDOSCOPY;  Service: Gastroenterology;  Laterality: N/A;   ESOPHAGOGASTRODUODENOSCOPY (EGD) WITH PROPOFOL N/A 05/29/2017   Procedure: ESOPHAGOGASTRODUODENOSCOPY (EGD) WITH PROPOFOL;  Surgeon: Christena Deem, MD;  Location: [facility] ENDOSCOPY;  Service: Endoscopy;  Laterality: N/A;   LEFT HEART CATH AND CORONARY ANGIOGRAPHY N/A 12/04/2016   Procedure: LEFT HEART CATH AND CORS/GRAFTS  ANGIOGRAPHY;  Surgeon: Iran Ouch, MD;  Location: [facility] INVASIVE CV LAB;  Service: Cardiovascular;  Laterality: N/A;   LEFT HEART CATH AND CORS/GRAFTS ANGIOGRAPHY N/A 04/09/2018   Procedure: LEFT HEART CATH AND CORS/GRAFTS ANGIOGRAPHY;  Surgeon: Marcina Millard, MD;  Location: [facility] INVASIVE CV LAB;  Service: Cardiovascular;  Laterality: N/A;   LEFT HEART CATH AND  CORS/GRAFTS ANGIOGRAPHY N/A 12/10/2018   Procedure: LEFT HEART CATH AND CORS/GRAFTS ANGIOGRAPHY;  Surgeon: Marcina Millard, MD;  Location: [facility] INVASIVE CV LAB;  Service: Cardiovascular;  Laterality: N/A;   LEFT HEART CATH AND CORS/GRAFTS ANGIOGRAPHY Left 05/25/2020   Procedure: LEFT HEART CATH AND CORS/GRAFTS ANGIOGRAPHY;  Surgeon: Marcina Millard, MD;  Location: [facility] INVASIVE CV LAB;  Service: Cardiovascular;  Laterality: Left;   RIB PLATING N/A 05/25/2016   Procedure: STERNAL PLATING;  Surgeon: Loreli Slot, MD;  Location: [facility] OR;  Service: Thoracic;  Laterality: N/A;   right shoulder     STERNAL WIRES REMOVAL N/A 03/30/2016   Procedure: STERNAL WIRES REMOVAL;  Surgeon: Loreli Slot, MD;  Location: [facility] OR;  Service: Thoracic;  Laterality: N/A;   TONSILLECTOMY      Allergies  Allergen Reactions   Contrast Media [Iodinated Contrast Media] Hives   Metrizamide Hives   Oxycodone Hcl Itching   Vicodin [Hydrocodone-Acetaminophen] Itching    Outpatient Encounter Medications as of 04/12/2023  Medication Sig   cyanocobalamin 1000 MCG tablet Take 1 tablet (1,000 mcg total) by mouth daily.   ELIQUIS 2.5 MG TABS tablet Take 2.5 mg by mouth 2 (two) times daily.   ezetimibe (ZETIA) 10 MG tablet Take 1 tablet (10 mg total) by mouth daily.   Ipratropium-Albuterol (COMBIVENT) 20-100 MCG/ACT AERS respimat Inhale 2 puffs into the lungs 4 (four) times daily as needed for wheezing.   isosorbide mononitrate (IMDUR) 60 MG 24 hr tablet Take 60 mg by mouth 2 (two) times daily.    midodrine (PROAMATINE) 10 MG tablet Take 1 tablet (10 mg total) by mouth 3 (three) times daily with meals.   nitroGLYCERIN (NITROSTAT) 0.4 MG SL tablet Place 1 tablet (0.4 mg total) under the tongue every 5 (five) minutes as needed for chest pain.   ranolazine (RANEXA) 1000 MG SR tablet Take 1 tablet (1,000 mg total) by mouth 2 (two) times daily.   senna-docusate (SENOKOT-S) 8.6-50 MG tablet Take 1 tablet by mouth at  bedtime as needed for mild constipation.   carbamide peroxide (DEBROX) 6.5 % OTIC solution Place 5 drops into both ears 2 (two) times daily. (Patient not taking: Reported on 04/12/2023)   diclofenac Sodium (VOLTAREN) 1 % GEL Apply 2 g topically 4 (four) times daily. (Patient not taking: Reported on 04/12/2023)   levalbuterol (XOPENEX) 0.63 MG/3ML nebulizer solution Inhale into the lungs. (Patient not taking: Reported on 04/12/2023)   No facility-administered encounter medications on file as of 04/12/2023.    Review of Systems***  Immunization History  Administered Date(s) Administered   Diphtheria 06/07/2022   Influenza, High Dose Seasonal PF 10/19/2015, 11/08/2016   Influenza,inj,quad, With Preservative 11/07/2017, 11/11/2018   Influenza-Unspecified 10/23/2013, 11/04/2014, 11/01/2017   Pneumococcal Conjugate-13 12/03/2015   RSV,unspecified 04/06/2023   Tdap 06/07/2022   Unspecified SARS-COV-2 Vaccination 03/19/2019, 04/16/2019, 01/08/2020   Pertinent  Health Maintenance Due  Topic Date Due   INFLUENZA VACCINE  04/23/2023 (Originally 08/24/2022)   Colonoscopy  08/24/2026      11/29/2020    1:14 PM 05/20/2021    2:18 PM 06/02/2021   10:24 AM 06/04/2021   10:53 AM 08/23/2021    9:37 AM  Fall Risk  (RETIRED) Patient Fall Risk Level Low fall risk Low fall risk Low fall risk High fall risk Low fall risk   Functional Status Survey:    Vitals:   04/12/23 1220  BP: 119/62  Pulse: (!) 52  Resp: 18  Temp: 97.6 F (36.4 C)  SpO2: 97%  Weight: 173 lb (78.5 kg)  Height: 5\' 10"  (1.778 m)   Body mass index is 24.82 kg/m. Physical Exam***  Labs reviewed: Recent Labs    03/28/23 0534 04/02/23 0445 04/05/23 0530 04/09/23 0000  NA 138 136 136 136*  K 4.3 4.3 4.3 4.3  CL 109 103 106 104  CO2 23 26 25  25*  GLUCOSE 91 95 94  --   BUN 29* 25* 22 18  CREATININE 1.26* 1.09 1.03 1.2  CALCIUM 8.6* 9.1 8.5* 8.6*   Recent Labs    09/01/22 1059 01/03/23 1340 03/27/23 1412  AST 28 19  21   ALT 21 16 17   ALKPHOS 51 47 44  BILITOT 1.0 0.7 0.9  PROT 7.2 6.7 7.5  ALBUMIN 3.5 3.5 3.7   Recent Labs    01/03/23 1340 03/27/23 1412 03/28/23 0534 04/02/23 0446 04/05/23 0530 04/09/23 0000  WBC 6.0   < > 8.7 9.2 7.3 5.8  NEUTROABS 3.5  --   --   --   --  3,219.00  HGB 13.3   < > 13.3 16.2 15.2 14.8  HCT 40.2   < > 38.4* 47.2 43.7 45  MCV 96.4   < > 93.7 92.5 94.0  --   PLT 214   < > 199 234 182 177   < > = values in this interval not displayed.   Lab Results  Component Value Date   TSH 3.114 03/29/2023   Lab Results  Component Value Date   HGBA1C 5.9 (H) 11/07/2016   Lab Results  Component Value Date   CHOL 128 10/03/2017   HDL 46 10/03/2017   LDLCALC 68 10/03/2017   TRIG 70 10/03/2017   CHOLHDL 2.8 10/03/2017    Significant Diagnostic Results in last 30 days:  MR BRAIN/IAC W WO CONTRAST Result Date: 04/05/2023 CLINICAL DATA:  Dizziness EXAM: MRI HEAD WITHOUT AND WITH CONTRAST TECHNIQUE: Multiplanar, multiecho pulse sequences of the brain and surrounding structures were obtained without and with intravenous contrast. CONTRAST:  7mL GADAVIST GADOBUTROL 1 MMOL/ML IV SOLN COMPARISON:  03/28/2023 FINDINGS: Brain: No acute infarct, mass effect or extra-axial collection. No acute or chronic hemorrhage. There is multifocal hyperintense T2-weighted signal within the white matter. Parenchymal volume and CSF spaces are normal. The midline structures are normal. There is no cerebellopontine angle mass. The cochleae and semicircular canals are normal. No focal abnormality along the course of the 7th and 8th cranial nerves. Normal porus acusticus and vestibular aqueduct bilaterally. Vascular: Normal flow voids. Skull and upper cervical spine: Normal calvarium and skull base. Visualized upper cervical spine and soft tissues are normal. Sinuses/Orbits:No paranasal sinus fluid levels or advanced mucosal thickening. No mastoid or middle ear effusion. Normal orbits. IMPRESSION: 1. No  acute intracranial abnormality. 2. Findings of chronic small vessel ischemia. 3. Normal internal auditory canal structures. Electronically Signed   By: Deatra Robinson M.D.   On: 04/05/2023 00:32   NM Myocar Multi W/Spect W/Wall Motion / EF Result Date: 04/04/2023   Findings are consistent with prior infarction given fixed defect that is not reversible and actually perfusion appears to be better during stress. Fixed perfusion defect in the apical and inferoapical region, Also with fixed perfusion defect in the basal inferior,inferolateral and lateral region The study is intermediate risk.   LV perfusion is abnormal. There is no evidence of ischemia. There is evidence of prior infarction given fixed defect. Fixed perfusion defect in the apical and inferoapical region, Also with fixed perfusion defect in the basal inferior,inferolateral and lateral region   Left ventricular function is abnormal 40-45%. Global function is mildly reduced. End diastolic cavity size is normal. End systolic cavity size is normal. This study is unchanged compared to prior nuc in 2020.   DG Chest 1 View Result Date: 04/04/2023 CLINICAL DATA:  Chest pain EXAM: CHEST  1 VIEW COMPARISON:  Chest x-ray 03/27/2023 FINDINGS: Sternotomy present. The heart size and mediastinal contours are within normal limits. Both lungs are clear. The visualized skeletal structures are unremarkable. IMPRESSION: No active disease. Electronically Signed   By: Darliss Cheney M.D.   On: 04/04/2023 00:49   MR CERVICAL SPINE WO CONTRAST Result Date: 03/29/2023 CLINICAL DATA:  Anterior cord syndrome.  Stroke-like symptoms. EXAM: MRI CERVICAL SPINE WITHOUT CONTRAST TECHNIQUE: Multiplanar, multisequence MR imaging of the cervical spine was performed. No intravenous contrast was administered. COMPARISON:  MRI cervical spine 12/08/2009.  Neck MRA 06/04/2021. FINDINGS: Technical note: Despite efforts by the technologist and patient, mild motion artifact is present on  today's exam and could not  be eliminated. This reduces exam sensitivity and specificity. Alignment: Reversal of the usual cervical lordosis with a mildly increased degenerative anterolisthesis at the C2-3 and C3-4 levels compared with remote cervical MRI. Minimal retrolisthesis at C5-6. Vertebrae: No evidence of acute fracture or traumatic subluxation. Progressive multilevel spondylosis with endplate osteophytes and facet hypertrophy. Cord: Progressive multilevel spinal stenosis without significant cord deformity. No definite abnormal cord signal. Posterior Fossa, vertebral arteries, paraspinal tissues: Visualized portions of the posterior fossa appear unremarkable.Dominant left vertebral artery with preserved flow voids bilaterally. No significant paraspinal findings. Disc levels: C2-3: Preserved disc height. Progressive facet hypertrophy and anterolisthesis with resulting effacement of the CSF surrounding the cord. No significant cord deformity. Mild foraminal narrowing bilaterally. C3-4: Progressive loss of disc height with disc bulging, uncinate spurring and asymmetric facet hypertrophy on the left. Effacement of the CSF surrounding the cord without cord deformity. Moderate to severe foraminal narrowing bilaterally. C4-5: Progressive spondylosis with loss of disc height and posterior osteophytes covering diffusely bulging disc material. Mild spinal stenosis without cord deformity. Chronic mild right and moderate left foraminal narrowing, similar to previous MRI. C5-6: Chronic spondylosis with loss of disc height and posterior osteophytes covering diffusely bulging disc material. Similar chronic effacement of the CSF surrounding the cord, asymmetric to the right. No significant cord deformity. Severe right and moderate left foraminal narrowing appears progressive. C6-7: Chronic spondylosis with loss of disc height and posterior osteophytes covering diffusely bulging disc material. Mild spinal stenosis without  cord deformity. Similar mild right and moderate left foraminal narrowing. C7-T1: Progressive loss of disc height with annular disc bulging, endplate osteophytes and bilateral facet hypertrophy, worse on the right. No significant central spinal stenosis. There is progressive foraminal narrowing, moderate on the right and mild on the left. IMPRESSION: 1. No acute findings or clear explanation for the patient's symptoms. 2. Progressive multilevel cervical spondylosis compared with remote MRI from 2011. There is resulting multilevel spinal stenosis without significant cord deformity or abnormal cord signal. 3. Multilevel foraminal narrowing, most severe bilaterally at C3-4 and on the right at C5-6. Electronically Signed   By: Carey Bullocks M.D.   On: 03/29/2023 16:55   US Carotid Bilateral Result Date: 03/29/2023 CLINICAL DATA:  83 year old male with stroke EXAM: BILATERAL CAROTID DUPLEX ULTRASOUND TECHNIQUE: Wallace Cullens scale imaging, color Doppler and duplex ultrasound were performed of bilateral carotid and vertebral arteries in the neck. COMPARISON:  None Available. FINDINGS: Criteria: Quantification of carotid stenosis is based on velocity parameters that correlate the residual internal carotid diameter with NASCET-based stenosis levels, using the diameter of the distal internal carotid lumen as the denominator for stenosis measurement. The following velocity measurements were obtained: RIGHT ICA:  Systolic 76 cm/sec, Diastolic 17 cm/sec CCA:  67 cm/sec SYSTOLIC ICA/CCA RATIO:  1.1 ECA:  97 cm/sec LEFT ICA:  Systolic 77 cm/sec, Diastolic 11 cm/sec CCA:  103 cm/sec SYSTOLIC ICA/CCA RATIO:  0.7 ECA:  95 cm/sec Right Brachial SBP: Not acquired Left Brachial SBP: Not acquired RIGHT CAROTID ARTERY: No significant calcifications of the right common carotid artery. Intermediate waveform maintained. Heterogeneous and partially calcified plaque at the right carotid bifurcation. No significant lumen shadowing. Low resistance  waveform of the right ICA. No significant tortuosity. RIGHT VERTEBRAL ARTERY: Antegrade flow with low resistance waveform. LEFT CAROTID ARTERY: No significant calcifications of the left common carotid artery. Intermediate waveform maintained. Heterogeneous and partially calcified plaque at the left carotid bifurcation. No significant lumen shadowing. Low resistance waveform of the left ICA. No significant tortuosity. LEFT VERTEBRAL ARTERY:  Antegrade flow with low resistance waveform. IMPRESSION: Color duplex indicates minimal heterogeneous and calcified plaque, with no hemodynamically significant stenosis by duplex criteria in the extracranial cerebrovascular circulation. Signed, Yvone Neu. Miachel Roux, RPVI [facility] Electronically Signed   By: Gilmer Mor D.O.   On: 03/29/2023 16:54   EEG adult Result Date: 03/29/2023 Charlsie Quest, MD     03/29/2023  4:22 PM Patient Name: Scott [name] MRN: [id number] Epilepsy Attending: Charlsie Quest Referring Physician/Provider: Pennie Banter, DO Date: 03/29/2023 Duration: 31.39 mins Patient history: 82yo m with syncope. EEG to evaluate for seizure Level of alertness: Awake, asleep AEDs during EEG study: None Technical aspects: This EEG study was done with scalp electrodes positioned according to the 10-20 International system of electrode placement. Electrical activity was reviewed with band pass filter of 1-70Hz , sensitivity of 7 uV/mm, display speed of 36mm/sec with a 60Hz  notched filter applied as appropriate. EEG data were recorded continuously and digitally stored.  Video monitoring was available and reviewed as appropriate. Description: The posterior dominant rhythm consists of 8-9 Hz activity of moderate voltage (25-35 uV) seen predominantly in posterior head regions, symmetric and reactive to eye opening and eye closing. Sleep was characterized by vertex waves, sleep spindles (12 to 14 Hz),  maximal frontocentral region.  There is an excessive amount of 15 to 18 Hz, 2-3 uV beta activity Hyperventilation and photic stimulation were not performed.   IMPRESSION: This study is within normal limits. No seizures or epileptiform discharges were seen throughout the recording. A normal interictal EEG does not exclude the diagnosis of epilepsy. Charlsie Quest   MR BRAIN WO CONTRAST Result Date: 03/28/2023 CLINICAL DATA:  Provided history: Headache, new onset. Persistent dizziness. EXAM: MRI HEAD WITHOUT CONTRAST TECHNIQUE: Multiplanar, multiecho pulse sequences of the brain and surrounding structures were obtained without intravenous contrast. COMPARISON:  Head CT 03/27/2023. FINDINGS: Intermittently motion degraded examination. Most notably, the axial T2 and axial FLAIR sequences are moderately motion degraded. Within this limitation, findings are as follows. Brain: Mild generalized cerebral atrophy. Multifocal T2 FLAIR hyperintense signal abnormality within the cerebral white matter, nonspecific but compatible with mild chronic small vessel ischemic disease. Punctate chronic microhemorrhage within the left cerebellar hemisphere (series 10, image 14). No cortical encephalomalacia is identified. There is no acute infarct. No evidence of an intracranial mass. No extra-axial fluid collection. No midline shift. Vascular: Maintained flow voids within the proximal large arterial vessels. Skull and upper cervical spine: No focal worrisome marrow lesion. Incompletely assessed cervical spondylosis. At C3-C4, a central disc protrusion contributes to apparent moderate spinal canal stenosis (with mild flattening of the ventral spinal cord). 3 mm C2-C3 grade 1 anterolisthesis. Sinuses/Orbits: No mass or acute finding within the imaged orbits. Prior bilateral ocular lens replacement. Minimal mucosal thickening scattered within bilateral ethmoid air cells. IMPRESSION: 1. Intermittently motion degraded examination. 2. No  evidence of an acute intracranial abnormality. The diffusion-weighted imaging is of good quality and there is no evidence of an acute infarct. 3. Mild chronic small vessel ischemic changes within the cerebral white matter. 4. Mild generalized cerebral atrophy. 5. Minor ethmoid sinus mucosal thickening bilaterally. 6. Incompletely assessed cervical spondylosis. At C3-C4, a central disc protrusion contributes to apparent moderate spinal canal stenosis (with mild flattening of the ventral spinal cord). Electronically Signed   By: Jackey Loge D.O.   On: 03/28/2023 14:12   ECHOCARDIOGRAM COMPLETE Result Date: 03/28/2023    ECHOCARDIOGRAM REPORT   Patient Name:  Laverda Sorenson Date of Exam: 03/27/2023 Medical Rec #:  [id number]      Height:       70.0 in Accession #:    [id number]     Weight:       176.1 lb Date of Birth:  03/07/1940       BSA:          1.978 m Patient Age:    82 years       BP:           127/63 mmHg Patient Gender: M              HR:           57 bpm. Exam Location:  [facility] Procedure: 2D Echo, Cardiac Doppler and Color Doppler (Both Spectral and Color            Flow Doppler were utilized during procedure). Indications:     R55 Syncope  History:         Patient has prior history of Echocardiogram examinations, most                  recent 03/21/2019. Cardiomyopathy, CAD, Signs/Symptoms:Dyspnea                  and Syncope; Risk Factors:Hypertension and Dyslipidemia.                  Pulmonary Emboli.  Sonographer:     Daphine Deutscher RDCS Referring Phys:  [id number] AMY N [facility] Diagnosing Phys: Alwyn Pea MD IMPRESSIONS  1. Left ventricular ejection fraction, by estimation, is 40 to 45%. The left ventricle has normal function. The left ventricle demonstrates global hypokinesis. Left ventricular diastolic parameters are consistent with Grade III diastolic dysfunction (restrictive).  2. Right ventricular systolic function is normal. The right ventricular size is normal.  3. Left atrial size was  moderately dilated.  4. Right atrial size was moderately dilated.  5. The mitral valve is normal in structure. Mild mitral valve regurgitation.  6. The aortic valve is normal in structure. Aortic valve regurgitation is not visualized. FINDINGS  Left Ventricle: Left ventricular ejection fraction, by estimation, is 40 to 45%. The left ventricle has normal function. The left ventricle demonstrates global hypokinesis. The left ventricular internal cavity size was normal in size. There is no left ventricular hypertrophy. Left ventricular diastolic parameters are consistent with Grade III diastolic dysfunction (restrictive). Right Ventricle: The right ventricular size is normal. No increase in right ventricular wall thickness. Right ventricular systolic function is normal. Left Atrium: Left atrial size was moderately dilated. Right Atrium: Right atrial size was moderately dilated. Pericardium: There is no evidence of pericardial effusion. Mitral Valve: The mitral valve is normal in structure. Mild mitral valve regurgitation. Tricuspid Valve: The tricuspid valve is normal in structure. Tricuspid valve regurgitation is mild. Aortic Valve: The aortic valve is normal in structure. Aortic valve regurgitation is not visualized. Pulmonic Valve: The pulmonic valve was normal in structure. Pulmonic valve regurgitation is not visualized. Aorta: The ascending aorta was not well visualized. IAS/Shunts: No atrial level shunt detected by color flow Doppler.  LEFT VENTRICLE PLAX 2D LVIDd:         5.30 cm   Diastology LVIDs:         4.80 cm   LV e' medial:    5.60 cm/s LV PW:         1.10 cm   LV E/e' medial:  16.8 LV IVS:  1.10 cm   LV e' lateral:   10.30 cm/s LVOT diam:     2.20 cm   LV E/e' lateral: 9.1 LV SV:         59 LV SV Index:   30 LVOT Area:     3.80 cm  RIGHT VENTRICLE            IVC RV Basal diam:  4.30 cm    IVC diam: 1.70 cm RV S prime:     9.20 cm/s TAPSE (M-mode): 2.2 cm LEFT ATRIUM           Index        RIGHT  ATRIUM           Index LA diam:      4.90 cm 2.48 cm/m   RA Area:     18.50 cm LA Vol (A2C): 23.0 ml 11.63 ml/m  RA Volume:   51.30 ml  25.94 ml/m LA Vol (A4C): 44.5 ml 22.50 ml/m  AORTIC VALVE LVOT Vmax:   62.20 cm/s LVOT Vmean:  41.600 cm/s LVOT VTI:    0.156 m  AORTA Ao Root diam: 3.30 cm Ao Asc diam:  3.90 cm MITRAL VALVE MV Area (PHT): 3.78 cm    SHUNTS MV Decel Time: 201 msec    Systemic VTI:  0.16 m MV E velocity: 93.80 cm/s  Systemic Diam: 2.20 cm MV A velocity: 47.95 cm/s MV E/A ratio:  1.96 Dwayne D Callwood MD Electronically signed by Alwyn Pea MD Signature Date/Time: 03/28/2023/12:41:21 PM    Final    CT HEAD WO CONTRAST ( ) Result Date: 03/28/2023 CLINICAL DATA:  Neuro deficit, acute, stroke suspected Headache, neuro deficit Headache, new onset (Age >= 51y) EXAM: CT HEAD WITHOUT CONTRAST TECHNIQUE: Contiguous axial images were obtained from the base of the skull through the vertex without intravenous contrast. RADIATION DOSE REDUCTION: This exam was performed according to the departmental dose-optimization program which includes automated exposure control, adjustment of the mA and/or kV according to patient size and/or use of iterative reconstruction technique. COMPARISON:  MRI head and CT head Timarie Labell 13, 2023. FINDINGS: Brain: No evidence of acute infarction, hemorrhage, hydrocephalus, extra-axial collection or mass lesion/mass effect. Vascular: No hyperdense vessel. Skull: No acute fracture. Sinuses/Orbits: Clear sinuses.  No acute orbital findings. Other: No mastoid effusions. IMPRESSION: No evidence of acute intracranial abnormality. Electronically Signed   By: Feliberto Harts M.D.   On: 03/28/2023 03:32   Portable Chest 1 View Result Date: 03/27/2023 CLINICAL DATA:  Dizziness with chest pain, initial encounter EXAM: PORTABLE CHEST 1 VIEW COMPARISON:  12/08/2022 FINDINGS: Postsurgical changes are again noted. Cardiac shadow is mildly prominent but accentuated by the portable  technique. Lungs are well aerated bilaterally. No focal infiltrate or effusion is seen. No bony abnormality is noted. Mild interstitial changes are again seen. IMPRESSION: No active disease. Electronically Signed   By: Alcide Clever M.D.   On: 03/27/2023 22:26    Assessment/Plan No problem-specific Assessment & Plan notes found for this encounter.    Janene Harvey. Biagio Borg [facility] [phone]

## 2023-04-12 NOTE — Progress Notes (Unsigned)
 Location:  Other Nursing Home Room Number: [facility] 112A Place of Service:  SNF ([phone])  Scott Nurse, MD  Patient Care Team: Scott Nurse, MD as PCP - General (Internal Medicine) Scott [name] Scott Pizza, MD as PCP - Cardiology (Cardiology) Scott Bihari, MD (Internal Medicine) Scott Freeze, FNP as [name] Practitioner (Cardiology) Scott Iba, MD as Consulting Physician (Cardiology) Scott Patience, MD as Consulting Physician (Oncology)  Extended Emergency Contact Information Primary Emergency Contact: [name],Scott Address: [address]          [place], [place] [address] [name] of [place] Home Phone: [phone] Mobile Phone: [phone] Relation: Spouse Secondary Emergency Contact: Scott Citrin Address: [address]          [place], [place] [address] [name] of [place] Home Phone: [phone] Mobile Phone: [phone] Relation: Daughter  Goals of care: Advanced Directive information    03/27/2023    9:52 PM  Advanced Directives  Does Patient Have a Medical Advance Directive? No  Type of Estate agent of [place];Living will  Copy of Healthcare Power of Attorney in Chart? No - copy requested  Would patient like information on creating a medical advance directive? No - Patient declined     Chief Complaint  Patient presents with   Chest Pain    Chest Pain.    Discussed the use of AI scribe software for clinical note transcription with the patient, who gave verbal consent to proceed.  HPI:  Pt is a 83 y.o. male seen today for an acute visit for chest pains.   The patient, with coronary artery disease, presents with chest pain.  He has hx of chest pain prior to admission to hospital and now in rehab however experiencing central, non-radiating chest pain that has become more frequent and bothersome, occurring both at rest and with exertion, particularly during physical activities like walking and exercising. Episodes  last about 15 minutes, and nitroglycerin provides relief. He is followed by cardiology and currently using a heart monitor to track episodes.  During chest pain episodes, they feel dizzy and lightheaded. he reports a low heart rate and low blood pressure. He deny experiencing headaches, indigestion, acid reflux, or bowel movement issues.  They have a history of coronary artery disease and have undergone open heart surgery with three bypass grafts and multiple catheterizations in the past. He follows closely with cardiology.  EKG done in facility shows first degree heart block which is not new, questionable T changes in V1-V3  Past Medical History:  Diagnosis Date   Arthritis    right hip, since a fall   CAD (coronary artery disease)    a. 04/2012 Cath: 3VD->Med Rx;  b. 04/2013 PCI RCA (2 DES); c. 03/2014 PCI: LAD 80 (3.0x23 Xience Alpine DES); d. 06/2014 CABG x 2 ([facility]) LIMA->LAD, VG->OM; e. 12/2014 Cath ([facility]): patent grafts->Med Rx; f. 08/2015 Cath: patent grafts; g. 11/2016 Cath: LM min irregs, LAD 20p, 16m, LCX 100p/m, RCA 20ost, 10p/m/d, VG->OM1 nl, LIMA->dLAD nl; f. 07/2017 MV: No isch, EF 51%.   Cardiomyopathy, ischemic    a. 04/2012 Echo: EF 40-45%;  b. 08/2013 Echo: EF 45-50%; c. 01/2015 Echo: EF 40-45%; d. 07/2016 Echo: EF 60-65%; e. 09/2017 Echo: EF 55-60%, Gr1 DD.   Carotid arterial disease (HCC)    a. 05/2013 Carotid U/S; bilat 40-50% ICA stenosis.   Chronic Chest Pain    USES NITRO   Chronic systolic CHF (congestive heart failure) (HCC)    a. 01/2015  Echo: EF 40-45%; b. 07/2016 Echo: EF 60-65%; c. 09/2017 Echo: EF 55-60%, Gr1 DD, nl RV fxn.   Cough    Diverticulosis    Dizziness    Dyspnea    WITH EXERTION   Headache 1970's   migraine   History of blood transfusion 2016   post op   Hyperlipidemia    Hypertension    Iron deficiency anemia due to chronic blood loss 09/11/2016   Myocardial infarction [facility])    unsure of when   Pulmonary emboli (HCC) 07/2016   a. On Xarelto.   Reflux  esophagitis    Sternal pain    a. 03/2016 s/p Sternal wire removal; b. 05/2016 s/p redo median sternotomy for sternal debridement and sternal plating.   Syncope 04/2018   CARDIOLOGIST WAS DR Scott [name] AND HE WAS AWARE-NOW PT SEES PARASCHOS   Tubular adenoma of colon    Past Surgical History:  Procedure Laterality Date   CARDIAC CATHETERIZATION  05/01/2012   CARDIAC CATHETERIZATION  04/2013   [facility];x3 stent   CARDIAC CATHETERIZATION  01/11/15    [facility]   CARDIAC CATHETERIZATION N/A 09/16/2015   Procedure: LEFT HEART CATH AND CORS/GRAFTS ANGIOGRAPHY;  Surgeon: Scott Iba, MD;  Location: [facility] INVASIVE CV LAB;  Service: Cardiovascular;  Laterality: N/A;   CARPAL TUNNEL RELEASE     right hand   CATARACT EXTRACTION     LEFT   CATARACT EXTRACTION W/PHACO Left 09/16/2014   Procedure: CATARACT EXTRACTION PHACO AND INTRAOCULAR LENS PLACEMENT (IOC);  Surgeon: Lockie Mola, MD;  Location: [facility];  Service: Ophthalmology;  Laterality: Left;   COLONOSCOPY     COLONOSCOPY WITH PROPOFOL N/A 01/28/2016   Procedure: COLONOSCOPY WITH PROPOFOL;  Surgeon: Christena Deem, MD;  Location: [facility] ENDOSCOPY;  Service: Endoscopy;  Laterality: N/A;   COLONOSCOPY WITH PROPOFOL N/A 08/23/2021   Procedure: COLONOSCOPY WITH PROPOFOL;  Surgeon: Toney Reil, MD;  Location: [facility] ENDOSCOPY;  Service: Gastroenterology;  Laterality: N/A;  REQUESTS 9AM OR LATER   CORONARY ANGIOPLASTY WITH STENT PLACEMENT  04/13/2014   CORONARY ARTERY BYPASS GRAFT  06-26-14   x3 bypasses   CORONARY PRESSURE/FFR WITH 3D MAPPING N/A 12/10/2018   Procedure: Mellody Dance PRESSURE WIRE/FFR STUDY;  Surgeon: Marcina Millard, MD;  Location: [facility] INVASIVE CV LAB;  Service: Cardiovascular;  Laterality: N/A;   CORONARY ULTRASOUND/IVUS N/A 12/10/2018   Procedure: Intravascular Ultrasound/IVUS;  Surgeon: Marcina Millard, MD;  Location: [facility] INVASIVE CV LAB;  Service: Cardiovascular;  Laterality: N/A;   CYSTOSCOPY N/A 08/14/2018    Procedure: CYSTOSCOPY;  Surgeon: Riki Altes, MD;  Location: [facility] ORS;  Service: Urology;  Laterality: N/A;   DE QUERVAIN'S RELEASE Left 08/22/2012   ESOPHAGOGASTRODUODENOSCOPY (EGD) WITH PROPOFOL N/A 04/26/2015   Procedure: ESOPHAGOGASTRODUODENOSCOPY (EGD) WITH PROPOFOL;  Surgeon: Wallace Cullens, MD;  Location: [facility] ENDOSCOPY;  Service: Gastroenterology;  Laterality: N/A;   ESOPHAGOGASTRODUODENOSCOPY (EGD) WITH PROPOFOL N/A 05/29/2017   Procedure: ESOPHAGOGASTRODUODENOSCOPY (EGD) WITH PROPOFOL;  Surgeon: Christena Deem, MD;  Location: [facility] ENDOSCOPY;  Service: Endoscopy;  Laterality: N/A;   LEFT HEART CATH AND CORONARY ANGIOGRAPHY N/A 12/04/2016   Procedure: LEFT HEART CATH AND CORS/GRAFTS  ANGIOGRAPHY;  Surgeon: Iran Ouch, MD;  Location: [facility] INVASIVE CV LAB;  Service: Cardiovascular;  Laterality: N/A;   LEFT HEART CATH AND CORS/GRAFTS ANGIOGRAPHY N/A 04/09/2018   Procedure: LEFT HEART CATH AND CORS/GRAFTS ANGIOGRAPHY;  Surgeon: Marcina Millard, MD;  Location: [facility] INVASIVE CV LAB;  Service: Cardiovascular;  Laterality: N/A;   LEFT HEART CATH AND CORS/GRAFTS  ANGIOGRAPHY N/A 12/10/2018   Procedure: LEFT HEART CATH AND CORS/GRAFTS ANGIOGRAPHY;  Surgeon: Marcina Millard, MD;  Location: [facility] INVASIVE CV LAB;  Service: Cardiovascular;  Laterality: N/A;   LEFT HEART CATH AND CORS/GRAFTS ANGIOGRAPHY Left 05/25/2020   Procedure: LEFT HEART CATH AND CORS/GRAFTS ANGIOGRAPHY;  Surgeon: Marcina Millard, MD;  Location: [facility] INVASIVE CV LAB;  Service: Cardiovascular;  Laterality: Left;   RIB PLATING N/A 05/25/2016   Procedure: STERNAL PLATING;  Surgeon: Loreli Slot, MD;  Location: [facility] OR;  Service: Thoracic;  Laterality: N/A;   right shoulder     STERNAL WIRES REMOVAL N/A 03/30/2016   Procedure: STERNAL WIRES REMOVAL;  Surgeon: Loreli Slot, MD;  Location: [facility] OR;  Service: Thoracic;  Laterality: N/A;   TONSILLECTOMY      Allergies  Allergen Reactions   Contrast Media  [Iodinated Contrast Media] Hives   Metrizamide Hives   Oxycodone Hcl Itching   Vicodin [Hydrocodone-Acetaminophen] Itching    Outpatient Encounter Medications as of 04/12/2023  Medication Sig   cyanocobalamin 1000 MCG tablet Take 1 tablet (1,000 mcg total) by mouth daily.   ELIQUIS 2.5 MG TABS tablet Take 2.5 mg by mouth 2 (two) times daily.   ezetimibe (ZETIA) 10 MG tablet Take 1 tablet (10 mg total) by mouth daily.   Ipratropium-Albuterol (COMBIVENT) 20-100 MCG/ACT AERS respimat Inhale 2 puffs into the lungs 4 (four) times daily as needed for wheezing.   isosorbide mononitrate (IMDUR) 60 MG 24 hr tablet Take 60 mg by mouth 2 (two) times daily.    midodrine (PROAMATINE) 10 MG tablet Take 1 tablet (10 mg total) by mouth 3 (three) times daily with meals.   nitroGLYCERIN (NITROSTAT) 0.4 MG SL tablet Place 1 tablet (0.4 mg total) under the tongue every 5 (five) minutes as needed for chest pain.   ranolazine (RANEXA) 1000 MG SR tablet Take 1 tablet (1,000 mg total) by mouth 2 (two) times daily.   senna-docusate (SENOKOT-S) 8.6-50 MG tablet Take 1 tablet by mouth at bedtime as needed for mild constipation.   carbamide peroxide (DEBROX) 6.5 % OTIC solution Place 5 drops into both ears 2 (two) times daily. (Patient not taking: Reported on 04/12/2023)   diclofenac Sodium (VOLTAREN) 1 % GEL Apply 2 g topically 4 (four) times daily. (Patient not taking: Reported on 04/12/2023)   levalbuterol (XOPENEX) 0.63 MG/3ML nebulizer solution Inhale into the lungs. (Patient not taking: Reported on 04/12/2023)   No facility-administered encounter medications on file as of 04/12/2023.    Review of Systems  Constitutional:  Negative for activity change, appetite change, fatigue and unexpected weight change.  HENT:  Negative for congestion and hearing loss.   Eyes: Negative.   Respiratory:  Negative for cough and shortness of breath.   Cardiovascular:  Positive for chest pain. Negative for palpitations and leg  swelling.  Gastrointestinal:  Negative for abdominal pain, constipation and diarrhea.  Genitourinary:  Negative for difficulty urinating and dysuria.  Musculoskeletal:  Negative for arthralgias and myalgias.  Skin:  Negative for color change and wound.  Neurological:  Positive for dizziness. Negative for weakness.  Psychiatric/Behavioral:  Negative for agitation, behavioral problems and confusion.     Immunization History  Administered Date(s) Administered   Diphtheria 06/07/2022   Influenza, High Dose Seasonal PF 10/19/2015, 11/08/2016   Influenza,inj,quad, With Preservative 11/07/2017, 11/11/2018   Influenza-Unspecified 10/23/2013, 11/04/2014, 11/01/2017   Pneumococcal Conjugate-13 12/03/2015   RSV,unspecified 04/06/2023   Tdap 06/07/2022   Unspecified SARS-COV-2 Vaccination 03/19/2019, 04/16/2019, 01/08/2020   Pertinent  Health Maintenance Due  Topic Date Due   INFLUENZA VACCINE  04/23/2023 (Originally 08/24/2022)   Colonoscopy  08/24/2026      11/29/2020    1:14 PM 05/20/2021    2:18 PM 06/02/2021   10:24 AM 06/04/2021   10:53 AM 08/23/2021    9:37 AM  Fall Risk  (RETIRED) Patient Fall Risk Level Low fall risk Low fall risk Low fall risk High fall risk Low fall risk   Functional Status Survey:    Vitals:   04/12/23 1220  BP: 119/62  Pulse: (!) 52  Resp: 18  Temp: 97.6 F (36.4 C)  SpO2: 97%  Weight: 173 lb (78.5 kg)  Height: 5\' 10"  (1.778 m)   Body mass index is 24.82 kg/m. Physical Exam Constitutional:      General: He is not in acute distress.    Appearance: He is well-developed. He is not diaphoretic.  HENT:     Head: Normocephalic and atraumatic.     Right Ear: External ear normal.     Left Ear: External ear normal.     Mouth/Throat:     Pharynx: No oropharyngeal exudate.  Eyes:     Conjunctiva/sclera: Conjunctivae normal.     Pupils: Pupils are equal, round, and reactive to light.  Cardiovascular:     Rate and Rhythm: Normal rate and regular rhythm.      Heart sounds: Normal heart sounds.  Pulmonary:     Effort: Pulmonary effort is normal.     Breath sounds: Normal breath sounds.  Abdominal:     General: Bowel sounds are normal.     Palpations: Abdomen is soft.  Musculoskeletal:        General: No tenderness.     Cervical back: Normal range of motion and neck supple.     Right lower leg: No edema.     Left lower leg: No edema.  Skin:    General: Skin is warm and dry.  Neurological:     Mental Status: He is alert and oriented to person, place, and time.     Labs reviewed: Recent Labs    03/28/23 0534 04/02/23 0445 04/05/23 0530 04/09/23 0000  NA 138 136 136 136*  K 4.3 4.3 4.3 4.3  CL 109 103 106 104  CO2 23 26 25  25*  GLUCOSE 91 95 94  --   BUN 29* 25* 22 18  CREATININE 1.26* 1.09 1.03 1.2  CALCIUM 8.6* 9.1 8.5* 8.6*   Recent Labs    09/01/22 1059 01/03/23 1340 03/27/23 1412  AST 28 19 21   ALT 21 16 17   ALKPHOS 51 47 44  BILITOT 1.0 0.7 0.9  PROT 7.2 6.7 7.5  ALBUMIN 3.5 3.5 3.7   Recent Labs    01/03/23 1340 03/27/23 1412 03/28/23 0534 04/02/23 0446 04/05/23 0530 04/09/23 0000  WBC 6.0   < > 8.7 9.2 7.3 5.8  NEUTROABS 3.5  --   --   --   --  3,219.00  HGB 13.3   < > 13.3 16.2 15.2 14.8  HCT 40.2   < > 38.4* 47.2 43.7 45  MCV 96.4   < > 93.7 92.5 94.0  --   PLT 214   < > 199 234 182 177   < > = values in this interval not displayed.   Lab Results  Component Value Date   TSH 3.114 03/29/2023   Lab Results  Component Value Date   HGBA1C 5.9 (H) 11/07/2016   Lab Results  Component Value Date   CHOL 128 10/03/2017   HDL 46 10/03/2017   LDLCALC 68 10/03/2017   TRIG 70 10/03/2017   CHOLHDL 2.8 10/03/2017    Significant Diagnostic Results in last 30 days:  MR BRAIN/IAC W WO CONTRAST Result Date: 04/05/2023 CLINICAL DATA:  Dizziness EXAM: MRI HEAD WITHOUT AND WITH CONTRAST TECHNIQUE: Multiplanar, multiecho pulse sequences of the brain and surrounding structures were obtained without and  with intravenous contrast. CONTRAST:  7mL GADAVIST GADOBUTROL 1 MMOL/ML IV SOLN COMPARISON:  03/28/2023 FINDINGS: Brain: No acute infarct, mass effect or extra-axial collection. No acute or chronic hemorrhage. There is multifocal hyperintense T2-weighted signal within the white matter. Parenchymal volume and CSF spaces are normal. The midline structures are normal. There is no cerebellopontine angle mass. The cochleae and semicircular canals are normal. No focal abnormality along the course of the 7th and 8th cranial nerves. Normal porus acusticus and vestibular aqueduct bilaterally. Vascular: Normal flow voids. Skull and upper cervical spine: Normal calvarium and skull base. Visualized upper cervical spine and soft tissues are normal. Sinuses/Orbits:No paranasal sinus fluid levels or advanced mucosal thickening. No mastoid or middle ear effusion. Normal orbits. IMPRESSION: 1. No acute intracranial abnormality. 2. Findings of chronic small vessel ischemia. 3. Normal internal auditory canal structures. Electronically Signed   By: Deatra Robinson M.D.   On: 04/05/2023 00:32   NM Myocar Multi W/Spect W/Wall Motion / EF Result Date: 04/04/2023   Findings are consistent with prior infarction given fixed defect that is not reversible and actually perfusion appears to be better during stress. Fixed perfusion defect in the apical and inferoapical region, Also with fixed perfusion defect in the basal inferior,inferolateral and lateral region The study is intermediate risk.   LV perfusion is abnormal. There is no evidence of ischemia. There is evidence of prior infarction given fixed defect. Fixed perfusion defect in the apical and inferoapical region, Also with fixed perfusion defect in the basal inferior,inferolateral and lateral region   Left ventricular function is abnormal 40-45%. Global function is mildly reduced. End diastolic cavity size is normal. End systolic cavity size is normal. This study is unchanged compared  to prior nuc in 2020.   DG Chest 1 View Result Date: 04/04/2023 CLINICAL DATA:  Chest pain EXAM: CHEST  1 VIEW COMPARISON:  Chest x-ray 03/27/2023 FINDINGS: Sternotomy present. The heart size and mediastinal contours are within normal limits. Both lungs are clear. The visualized skeletal structures are unremarkable. IMPRESSION: No active disease. Electronically Signed   By: Darliss Cheney M.D.   On: 04/04/2023 00:49   MR CERVICAL SPINE WO CONTRAST Result Date: 03/29/2023 CLINICAL DATA:  Anterior cord syndrome.  Stroke-like symptoms. EXAM: MRI CERVICAL SPINE WITHOUT CONTRAST TECHNIQUE: Multiplanar, multisequence MR imaging of the cervical spine was performed. No intravenous contrast was administered. COMPARISON:  MRI cervical spine 12/08/2009.  Neck MRA 06/04/2021. FINDINGS: Technical note: Despite efforts by the technologist and patient, mild motion artifact is present on today's exam and could not be eliminated. This reduces exam sensitivity and specificity. Alignment: Reversal of the usual cervical lordosis with a mildly increased degenerative anterolisthesis at the C2-3 and C3-4 levels compared with remote cervical MRI. Minimal retrolisthesis at C5-6. Vertebrae: No evidence of acute fracture or traumatic subluxation. Progressive multilevel spondylosis with endplate osteophytes and facet hypertrophy. Cord: Progressive multilevel spinal stenosis without significant cord deformity. No definite abnormal cord signal. Posterior Fossa, vertebral arteries, paraspinal tissues: Visualized portions of the posterior fossa appear unremarkable.Dominant left vertebral artery with preserved flow voids bilaterally.  No significant paraspinal findings. Disc levels: C2-3: Preserved disc height. Progressive facet hypertrophy and anterolisthesis with resulting effacement of the CSF surrounding the cord. No significant cord deformity. Mild foraminal narrowing bilaterally. C3-4: Progressive loss of disc height with disc bulging,  uncinate spurring and asymmetric facet hypertrophy on the left. Effacement of the CSF surrounding the cord without cord deformity. Moderate to severe foraminal narrowing bilaterally. C4-5: Progressive spondylosis with loss of disc height and posterior osteophytes covering diffusely bulging disc material. Mild spinal stenosis without cord deformity. Chronic mild right and moderate left foraminal narrowing, similar to previous MRI. C5-6: Chronic spondylosis with loss of disc height and posterior osteophytes covering diffusely bulging disc material. Similar chronic effacement of the CSF surrounding the cord, asymmetric to the right. No significant cord deformity. Severe right and moderate left foraminal narrowing appears progressive. C6-7: Chronic spondylosis with loss of disc height and posterior osteophytes covering diffusely bulging disc material. Mild spinal stenosis without cord deformity. Similar mild right and moderate left foraminal narrowing. C7-T1: Progressive loss of disc height with annular disc bulging, endplate osteophytes and bilateral facet hypertrophy, worse on the right. No significant central spinal stenosis. There is progressive foraminal narrowing, moderate on the right and mild on the left. IMPRESSION: 1. No acute findings or clear explanation for the patient's symptoms. 2. Progressive multilevel cervical spondylosis compared with remote MRI from 2011. There is resulting multilevel spinal stenosis without significant cord deformity or abnormal cord signal. 3. Multilevel foraminal narrowing, most severe bilaterally at C3-4 and on the right at C5-6. Electronically Signed   By: Carey Bullocks M.D.   On: 03/29/2023 16:55   US Carotid Bilateral Result Date: 03/29/2023 CLINICAL DATA:  83 year old male with stroke EXAM: BILATERAL CAROTID DUPLEX ULTRASOUND TECHNIQUE: Wallace Cullens scale imaging, color Doppler and duplex ultrasound were performed of bilateral carotid and vertebral arteries in the neck.  COMPARISON:  None Available. FINDINGS: Criteria: Quantification of carotid stenosis is based on velocity parameters that correlate the residual internal carotid diameter with NASCET-based stenosis levels, using the diameter of the distal internal carotid lumen as the denominator for stenosis measurement. The following velocity measurements were obtained: RIGHT ICA:  Systolic 76 cm/sec, Diastolic 17 cm/sec CCA:  67 cm/sec SYSTOLIC ICA/CCA RATIO:  1.1 ECA:  97 cm/sec LEFT ICA:  Systolic 77 cm/sec, Diastolic 11 cm/sec CCA:  103 cm/sec SYSTOLIC ICA/CCA RATIO:  0.7 ECA:  95 cm/sec Right Brachial SBP: Not acquired Left Brachial SBP: Not acquired RIGHT CAROTID ARTERY: No significant calcifications of the right common carotid artery. Intermediate waveform maintained. Heterogeneous and partially calcified plaque at the right carotid bifurcation. No significant lumen shadowing. Low resistance waveform of the right ICA. No significant tortuosity. RIGHT VERTEBRAL ARTERY: Antegrade flow with low resistance waveform. LEFT CAROTID ARTERY: No significant calcifications of the left common carotid artery. Intermediate waveform maintained. Heterogeneous and partially calcified plaque at the left carotid bifurcation. No significant lumen shadowing. Low resistance waveform of the left ICA. No significant tortuosity. LEFT VERTEBRAL ARTERY:  Antegrade flow with low resistance waveform. IMPRESSION: Color duplex indicates minimal heterogeneous and calcified plaque, with no hemodynamically significant stenosis by duplex criteria in the extracranial cerebrovascular circulation. Signed, Yvone Neu. Miachel Roux, RPVI [facility] Electronically Signed   By: Gilmer Mor D.O.   On: 03/29/2023 16:54   EEG adult Result Date: 03/29/2023 Charlsie Quest, MD     03/29/2023  4:22 PM Patient Name: OLIVE MOTYKA MRN: [id number] Epilepsy Attending: Charlsie Quest Referring  Physician/Provider: Pennie Banter, DO Date: 03/29/2023 Duration: 31.39 mins Patient history: 82yo m with syncope. EEG to evaluate for seizure Level of alertness: Awake, asleep AEDs during EEG study: None Technical aspects: This EEG study was done with scalp electrodes positioned according to the 10-20 International system of electrode placement. Electrical activity was reviewed with band pass filter of 1-70Hz , sensitivity of 7 uV/mm, display speed of 56mm/sec with a 60Hz  notched filter applied as appropriate. EEG data were recorded continuously and digitally stored.  Video monitoring was available and reviewed as appropriate. Description: The posterior dominant rhythm consists of 8-9 Hz activity of moderate voltage (25-35 uV) seen predominantly in posterior head regions, symmetric and reactive to eye opening and eye closing. Sleep was characterized by vertex waves, sleep spindles (12 to 14 Hz), maximal frontocentral region.  There is an excessive amount of 15 to 18 Hz, 2-3 uV beta activity Hyperventilation and photic stimulation were not performed.   IMPRESSION: This study is within normal limits. No seizures or epileptiform discharges were seen throughout the recording. A normal interictal EEG does not exclude the diagnosis of epilepsy. Charlsie Quest   MR BRAIN WO CONTRAST Result Date: 03/28/2023 CLINICAL DATA:  Provided history: Headache, new onset. Persistent dizziness. EXAM: MRI HEAD WITHOUT CONTRAST TECHNIQUE: Multiplanar, multiecho pulse sequences of the brain and surrounding structures were obtained without intravenous contrast. COMPARISON:  Head CT 03/27/2023. FINDINGS: Intermittently motion degraded examination. Most notably, the axial T2 and axial FLAIR sequences are moderately motion degraded. Within this limitation, findings are as follows. Brain: Mild generalized cerebral atrophy. Multifocal T2 FLAIR hyperintense signal abnormality within the cerebral white matter, nonspecific but compatible with  mild chronic small vessel ischemic disease. Punctate chronic microhemorrhage within the left cerebellar hemisphere (series 10, image 14). No cortical encephalomalacia is identified. There is no acute infarct. No evidence of an intracranial mass. No extra-axial fluid collection. No midline shift. Vascular: Maintained flow voids within the proximal large arterial vessels. Skull and upper cervical spine: No focal worrisome marrow lesion. Incompletely assessed cervical spondylosis. At C3-C4, a central disc protrusion contributes to apparent moderate spinal canal stenosis (with mild flattening of the ventral spinal cord). 3 mm C2-C3 grade 1 anterolisthesis. Sinuses/Orbits: No mass or acute finding within the imaged orbits. Prior bilateral ocular lens replacement. Minimal mucosal thickening scattered within bilateral ethmoid air cells. IMPRESSION: 1. Intermittently motion degraded examination. 2. No evidence of an acute intracranial abnormality. The diffusion-weighted imaging is of good quality and there is no evidence of an acute infarct. 3. Mild chronic small vessel ischemic changes within the cerebral white matter. 4. Mild generalized cerebral atrophy. 5. Minor ethmoid sinus mucosal thickening bilaterally. 6. Incompletely assessed cervical spondylosis. At C3-C4, a central disc protrusion contributes to apparent moderate spinal canal stenosis (with mild flattening of the ventral spinal cord). Electronically Signed   By: Jackey Loge D.O.   On: 03/28/2023 14:12   ECHOCARDIOGRAM COMPLETE Result Date: 03/28/2023    ECHOCARDIOGRAM REPORT   Patient Name:   Scott [name] Date of Exam: 03/27/2023 Medical Rec #:  [id number]      Height:       70.0 in Accession #:    [id number]     Weight:       176.1 lb Date of Birth:  05-25-1940       BSA:          1.978 m Patient Age:    82 years       BP:  127/63 mmHg Patient Gender: M              HR:           57 bpm. Exam Location:  [facility] Procedure: 2D Echo, Cardiac Doppler and  Color Doppler (Both Spectral and Color            Flow Doppler were utilized during procedure). Indications:     R55 Syncope  History:         Patient has prior history of Echocardiogram examinations, most                  recent 03/21/2019. Cardiomyopathy, CAD, Signs/Symptoms:Dyspnea                  and Syncope; Risk Factors:Hypertension and Dyslipidemia.                  Pulmonary Emboli.  Sonographer:     Daphine Deutscher RDCS Referring Phys:  [id number] AMY N COX Diagnosing Phys: Alwyn Pea MD IMPRESSIONS  1. Left ventricular ejection fraction, by estimation, is 40 to 45%. The left ventricle has normal function. The left ventricle demonstrates global hypokinesis. Left ventricular diastolic parameters are consistent with Grade III diastolic dysfunction (restrictive).  2. Right ventricular systolic function is normal. The right ventricular size is normal.  3. Left atrial size was moderately dilated.  4. Right atrial size was moderately dilated.  5. The mitral valve is normal in structure. Mild mitral valve regurgitation.  6. The aortic valve is normal in structure. Aortic valve regurgitation is not visualized. FINDINGS  Left Ventricle: Left ventricular ejection fraction, by estimation, is 40 to 45%. The left ventricle has normal function. The left ventricle demonstrates global hypokinesis. The left ventricular internal cavity size was normal in size. There is no left ventricular hypertrophy. Left ventricular diastolic parameters are consistent with Grade III diastolic dysfunction (restrictive). Right Ventricle: The right ventricular size is normal. No increase in right ventricular wall thickness. Right ventricular systolic function is normal. Left Atrium: Left atrial size was moderately dilated. Right Atrium: Right atrial size was moderately dilated. Pericardium: There is no evidence of pericardial effusion. Mitral Valve: The mitral valve is normal in structure. Mild mitral valve regurgitation. Tricuspid  Valve: The tricuspid valve is normal in structure. Tricuspid valve regurgitation is mild. Aortic Valve: The aortic valve is normal in structure. Aortic valve regurgitation is not visualized. Pulmonic Valve: The pulmonic valve was normal in structure. Pulmonic valve regurgitation is not visualized. Aorta: The ascending aorta was not well visualized. IAS/Shunts: No atrial level shunt detected by color flow Doppler.  LEFT VENTRICLE PLAX 2D LVIDd:         5.30 cm   Diastology LVIDs:         4.80 cm   LV e' medial:    5.60 cm/s LV PW:         1.10 cm   LV E/e' medial:  16.8 LV IVS:        1.10 cm   LV e' lateral:   10.30 cm/s LVOT diam:     2.20 cm   LV E/e' lateral: 9.1 LV SV:         59 LV SV Index:   30 LVOT Area:     3.80 cm  RIGHT VENTRICLE            IVC RV Basal diam:  4.30 cm    IVC diam: 1.70 cm RV S prime:     9.20  cm/s TAPSE (M-mode): 2.2 cm LEFT ATRIUM           Index        RIGHT ATRIUM           Index LA diam:      4.90 cm 2.48 cm/m   RA Area:     18.50 cm LA Vol (A2C): 23.0 ml 11.63 ml/m  RA Volume:   51.30 ml  25.94 ml/m LA Vol (A4C): 44.5 ml 22.50 ml/m  AORTIC VALVE LVOT Vmax:   62.20 cm/s LVOT Vmean:  41.600 cm/s LVOT VTI:    0.156 m  AORTA Ao Root diam: 3.30 cm Ao Asc diam:  3.90 cm MITRAL VALVE MV Area (PHT): 3.78 cm    SHUNTS MV Decel Time: 201 msec    Systemic VTI:  0.16 m MV E velocity: 93.80 cm/s  Systemic Diam: 2.20 cm MV A velocity: 47.95 cm/s MV E/A ratio:  1.96 Dwayne D Callwood MD Electronically signed by Alwyn Pea MD Signature Date/Time: 03/28/2023/12:41:21 PM    Final    CT HEAD WO CONTRAST ( ) Result Date: 03/28/2023 CLINICAL DATA:  Neuro deficit, acute, stroke suspected Headache, neuro deficit Headache, new onset (Age >= 51y) EXAM: CT HEAD WITHOUT CONTRAST TECHNIQUE: Contiguous axial images were obtained from the base of the skull through the vertex without intravenous contrast. RADIATION DOSE REDUCTION: This exam was performed according to the departmental  dose-optimization program which includes automated exposure control, adjustment of the mA and/or kV according to patient size and/or use of iterative reconstruction technique. COMPARISON:  MRI head and CT head Jun 04, 2021. FINDINGS: Brain: No evidence of acute infarction, hemorrhage, hydrocephalus, extra-axial collection or mass lesion/mass effect. Vascular: No hyperdense vessel. Skull: No acute fracture. Sinuses/Orbits: Clear sinuses.  No acute orbital findings. Other: No mastoid effusions. IMPRESSION: No evidence of acute intracranial abnormality. Electronically Signed   By: Feliberto Harts M.D.   On: 03/28/2023 03:32   Portable Chest 1 View Result Date: 03/27/2023 CLINICAL DATA:  Dizziness with chest pain, initial encounter EXAM: PORTABLE CHEST 1 VIEW COMPARISON:  12/08/2022 FINDINGS: Postsurgical changes are again noted. Cardiac shadow is mildly prominent but accentuated by the portable technique. Lungs are well aerated bilaterally. No focal infiltrate or effusion is seen. No bony abnormality is noted. Mild interstitial changes are again seen. IMPRESSION: No active disease. Electronically Signed   By: Alcide Clever M.D.   On: 03/27/2023 22:26    Assessment/Plan No problem-specific Assessment & Plan notes found for this encounter. Chest Pain Intermittent central chest pain at rest and exertion. EKG first degree block with possible t changes in V1-V3. He declined going to hospital last night.  Nitroglycerin given with good relief. Continues on imdur and Ranexa.  - called cardiologist to discuss, will be seen today in office for evaluation  - Advise minimal physical activity until evaluation. - Instruct to use nitroglycerin for chest pain.  Coronary Artery Disease History of coronary artery disease with previous bypass and catheterizations. Current symptoms and EKG findings may indicate progression or complications. - Continue Imdur and Ranexa. - Monitor blood pressure and adjust medications if  necessary.  Follow-up Cardiologist evaluation needed for cardiac status and EKG findings. Immediate consultation prioritized due to potential risk of serious cardiac events.  Janene Harvey. Biag[facility] [phone]

## 2023-04-19 ENCOUNTER — Non-Acute Institutional Stay (SKILLED_NURSING_FACILITY): Payer: Self-pay | Admitting: Nurse Practitioner

## 2023-04-19 ENCOUNTER — Encounter: Payer: Self-pay | Admitting: Nurse Practitioner

## 2023-04-19 DIAGNOSIS — I951 Orthostatic hypotension: Secondary | ICD-10-CM

## 2023-04-19 DIAGNOSIS — I25709 Atherosclerosis of coronary artery bypass graft(s), unspecified, with unspecified angina pectoris: Secondary | ICD-10-CM

## 2023-04-19 DIAGNOSIS — I5032 Chronic diastolic (congestive) heart failure: Secondary | ICD-10-CM

## 2023-04-19 DIAGNOSIS — E782 Mixed hyperlipidemia: Secondary | ICD-10-CM

## 2023-04-19 DIAGNOSIS — I480 Paroxysmal atrial fibrillation: Secondary | ICD-10-CM | POA: Diagnosis not present

## 2023-04-19 MED ORDER — NITROGLYCERIN 0.4 MG SL SUBL: 0.4000 mg | SUBLINGUAL_TABLET | SUBLINGUAL | 0 refills | Status: AC | PRN

## 2023-04-19 MED ORDER — ISOSORBIDE MONONITRATE ER 60 MG PO TB24: 60.0000 mg | ORAL_TABLET | Freq: Two times a day (BID) | ORAL | 0 refills | Status: AC

## 2023-04-19 MED ORDER — RANOLAZINE ER 1000 MG PO TB12: 1000.0000 mg | ORAL_TABLET | Freq: Two times a day (BID) | ORAL | 0 refills | Status: AC

## 2023-04-19 MED ORDER — ELIQUIS 2.5 MG PO TABS: 2.5000 mg | ORAL_TABLET | Freq: Two times a day (BID) | ORAL | 0 refills | Status: AC

## 2023-04-19 MED ORDER — ATORVASTATIN CALCIUM 80 MG PO TABS: 80.0000 mg | ORAL_TABLET | Freq: Every day | ORAL | 0 refills | Status: AC

## 2023-04-19 MED ORDER — MIDODRINE HCL 10 MG PO TABS: 10.0000 mg | ORAL_TABLET | Freq: Three times a day (TID) | ORAL | 0 refills | Status: DC

## 2023-04-19 MED ORDER — EZETIMIBE 10 MG PO TABS
10.0000 mg | ORAL_TABLET | Freq: Every day | ORAL | 0 refills | Status: AC
Start: 2023-04-19 — End: ?

## 2023-04-19 NOTE — Progress Notes (Signed)
 Location:  Other [place].  Nursing Home Room Number: [facility] [address] Place of Service:  SNF [phone]) Abbey Chatters, NP  PCP: [name], MD  Patient Care Team: [name], MD as PCP - General (Internal Medicine) Mariah Milling Tollie Pizza, MD as PCP - Cardiology (Cardiology) Rafael Bihari, MD (Internal Medicine) Delma Freeze, FNP as Nurse Practitioner (Cardiology) Antonieta Iba, MD as Consulting Physician (Cardiology) Rickard Patience, MD as Consulting Physician (Oncology)  Extended Emergency Contact Information Primary Emergency Contact: Hovanec,[name] Address: [address]          [place], [place] [address] [name] of [place] Home Phone: [phone] Mobile Phone: [phone] Relation: Spouse Secondary Emergency Contact: Donalee Citrin Address: [address]          [place], [place] [address] [name] of [place] Home Phone: [phone] Mobile Phone: [phone] Relation: Daughter  Goals of care: Advanced Directive information    03/27/2023    9:52 PM  Advanced Directives  Does Patient Have a Medical Advance Directive? No  Type of Estate agent of [place];Living will  Copy of Healthcare Power of Attorney in Chart? No - copy requested  Would patient like information on creating a medical advance directive? No - Patient declined     Chief Complaint  Patient presents with   Discharge Note    Discharge    HPI:  Pt is a 83 y.o. male seen today for Discharge.  Pt with hx of CAD, dizziness at [facility] for short term rehab after hospitalization for severe dizziness. He was having ongoing chest pain with exertion and seen by cardiologist, overall EKG stable without change.  He continues to wear a 30 days event monitor and will follow up with cardiology outpatient.  He reports his dizziness has improved significantly since he has been in rehab. Continues to walk with walker but feels like he does not really need the support.   He has no concerns about going home and ready for discharge.    Past Medical History:  Diagnosis Date   Arthritis    right hip, since a fall   CAD (coronary artery disease)    a. 04/2012 Cath: 3VD->Med Rx;  b. 04/2013 PCI RCA (2 DES); c. 03/2014 PCI: LAD 80 (3.0x23 Xience Alpine DES); d. 06/2014 CABG x 2 ([facility]) LIMA->LAD, VG->OM; e. 12/2014 Cath ([facility]): patent grafts->Med Rx; f. 08/2015 Cath: patent grafts; g. 11/2016 Cath: LM min irregs, LAD 20p, 63m, LCX 100p/m, RCA 20ost, 10p/m/d, VG->OM1 nl, LIMA->dLAD nl; f. 07/2017 MV: No isch, EF 51%.   Cardiomyopathy, ischemic    a. 04/2012 Echo: EF 40-45%;  b. 08/2013 Echo: EF 45-50%; c. 01/2015 Echo: EF 40-45%; d. 07/2016 Echo: EF 60-65%; e. 09/2017 Echo: EF 55-60%, Gr1 DD.   Carotid arterial disease (HCC)    a. 05/2013 Carotid U/S; bilat 40-50% ICA stenosis.   Chronic Chest Pain    USES NITRO   Chronic systolic CHF (congestive heart failure) (HCC)    a. 01/2015 Echo: EF 40-45%; b. 07/2016 Echo: EF 60-65%; c. 09/2017 Echo: EF 55-60%, Gr1 DD, nl RV fxn.   Cough    Diverticulosis    Dizziness    Dyspnea    WITH EXERTION   Headache 1970's   migraine   History of blood transfusion 2016   post op   Hyperlipidemia    Hypertension    Iron deficiency anemia due to chronic blood loss 09/11/2016   Myocardial infarction (HCC)  unsure of when   Pulmonary emboli (HCC) 07/2016   a. On Xarelto.   Reflux esophagitis    Sternal pain    a. 03/2016 s/p Sternal wire removal; b. 05/2016 s/p redo median sternotomy for sternal debridement and sternal plating.   Syncope 04/2018   CARDIOLOGIST WAS DR Mariah Milling AND HE WAS AWARE-NOW PT SEES PARASCHOS   Tubular adenoma of colon    Past Surgical History:  Procedure Laterality Date   CARDIAC CATHETERIZATION  05/01/2012   CARDIAC CATHETERIZATION  04/2013   [facility];x3 stent   CARDIAC CATHETERIZATION  01/11/15    [facility]   CARDIAC CATHETERIZATION N/A 09/16/2015   Procedure: LEFT HEART CATH AND CORS/GRAFTS ANGIOGRAPHY;  Surgeon:  Antonieta Iba, MD;  Location: [facility] INVASIVE CV LAB;  Service: Cardiovascular;  Laterality: N/A;   CARPAL TUNNEL RELEASE     right hand   CATARACT EXTRACTION     LEFT   CATARACT EXTRACTION W/PHACO Left 09/16/2014   Procedure: CATARACT EXTRACTION PHACO AND INTRAOCULAR LENS PLACEMENT (IOC);  Surgeon: Lockie Mola, MD;  Location: [facility];  Service: Ophthalmology;  Laterality: Left;   COLONOSCOPY     COLONOSCOPY WITH PROPOFOL N/A 01/28/2016   Procedure: COLONOSCOPY WITH PROPOFOL;  Surgeon: Christena Deem, MD;  Location: [facility] ENDOSCOPY;  Service: Endoscopy;  Laterality: N/A;   COLONOSCOPY WITH PROPOFOL N/A 08/23/2021   Procedure: COLONOSCOPY WITH PROPOFOL;  Surgeon: Toney Reil, MD;  Location: [facility] ENDOSCOPY;  Service: Gastroenterology;  Laterality: N/A;  REQUESTS 9AM OR LATER   CORONARY ANGIOPLASTY WITH STENT PLACEMENT  04/13/2014   CORONARY ARTERY BYPASS GRAFT  06-26-14   x3 bypasses   CORONARY PRESSURE/FFR WITH 3D MAPPING N/A 12/10/2018   Procedure: Mellody Dance PRESSURE WIRE/FFR STUDY;  Surgeon: Marcina Millard, MD;  Location: [facility] INVASIVE CV LAB;  Service: Cardiovascular;  Laterality: N/A;   CORONARY ULTRASOUND/IVUS N/A 12/10/2018   Procedure: Intravascular Ultrasound/IVUS;  Surgeon: Marcina Millard, MD;  Location: [facility] INVASIVE CV LAB;  Service: Cardiovascular;  Laterality: N/A;   CYSTOSCOPY N/A 08/14/2018   Procedure: CYSTOSCOPY;  Surgeon: Riki Altes, MD;  Location: [facility] ORS;  Service: Urology;  Laterality: N/A;   DE QUERVAIN'S RELEASE Left 08/22/2012   ESOPHAGOGASTRODUODENOSCOPY (EGD) WITH PROPOFOL N/A 04/26/2015   Procedure: ESOPHAGOGASTRODUODENOSCOPY (EGD) WITH PROPOFOL;  Surgeon: Wallace Cullens, MD;  Location: [facility] ENDOSCOPY;  Service: Gastroenterology;  Laterality: N/A;   ESOPHAGOGASTRODUODENOSCOPY (EGD) WITH PROPOFOL N/A 05/29/2017   Procedure: ESOPHAGOGASTRODUODENOSCOPY (EGD) WITH PROPOFOL;  Surgeon: Christena Deem, MD;  Location: [facility] ENDOSCOPY;   Service: Endoscopy;  Laterality: N/A;   LEFT HEART CATH AND CORONARY ANGIOGRAPHY N/A 12/04/2016   Procedure: LEFT HEART CATH AND CORS/GRAFTS  ANGIOGRAPHY;  Surgeon: Iran Ouch, MD;  Location: [facility] INVASIVE CV LAB;  Service: Cardiovascular;  Laterality: N/A;   LEFT HEART CATH AND CORS/GRAFTS ANGIOGRAPHY N/A 04/09/2018   Procedure: LEFT HEART CATH AND CORS/GRAFTS ANGIOGRAPHY;  Surgeon: Marcina Millard, MD;  Location: [facility] INV[facility] CV LAB;  Service: Cardiovascular;  Laterality: N/A;   LEFT HEART CATH AND CORS/GRAFTS ANGIOGRAPHY N/A 12/10/2018   Procedure: LEFT HEART CATH AND CORS/GRAFTS ANGIOGRAPHY;  Surgeon: Marcina Millard, MD;  Location: [facility] INV[facility] CV LAB;  Service: Cardiovascular;  Laterality: N/A;   LEFT HEART CATH AND CORS/GRAFTS ANGIOGRAPHY Left 05/25/2020   Procedure: LEFT HEART CATH AND CORS/GRAFTS ANGIOGRAPHY;  Surgeon: Marcina Millard, MD;  Location: [facility] CV LAB;  Service: Cardiovascular;  Laterality: Left;   RIB PLATING N/A 05/25/2016   Procedure: STERNAL PLATING;  Surgeon: Loreli Slot, MD;  Location: [facility] OR;  Service: Thoracic;  Laterality: N/A;   right shoulder     STERNAL WIRES REMOVAL N/A 03/30/2016   Procedure: STERNAL WIRES REMOVAL;  Surgeon: Loreli Slot, MD;  Location: [facility] OR;  Service: Thoracic;  Laterality: N/A;   TONSILLECTOMY      Allergies  Allergen Reactions   Contrast Media [Iodinated Contrast Media] Hives   Metrizamide Hives   Oxycodone Hcl Itching   Vicodin [Hydrocodone-Acetaminophen] Itching    Outpatient Encounter Medications as of 04/19/2023  Medication Sig   atorvastatin (LIPITOR) 80 MG tablet Take 80 mg by mouth daily.   cyanocobalamin 1000 MCG tablet Take 1 tablet (1,000 mcg total) by mouth daily.   ELIQUIS 2.5 MG TABS tablet Take 2.5 mg by mouth 2 (two) times daily.   ezetimibe (ZETIA) 10 MG tablet Take 1 tablet (10 mg total) by mouth daily.   Ipratropium-Albuterol (COMBIVENT) 20-100 MCG/ACT AERS respimat Inhale  2 puffs into the lungs 4 (four) times daily as needed for wheezing.   isosorbide mononitrate (IMDUR) 60 MG 24 hr tablet Take 60 mg by mouth 2 (two) times daily.    midodrine (PROAMATINE) 10 MG tablet Take 1 tablet (10 mg total) by mouth 3 (three) times daily with meals.   nitroGLYCERIN (NITROSTAT) 0.4 MG SL tablet Place 1 tablet (0.4 mg total) under the tongue every 5 (five) minutes as needed for chest pain.   ranolazine (RANEXA) 1000 MG SR tablet Take 1 tablet (1,000 mg total) by mouth 2 (two) times daily.   senna-docusate (SENOKOT-S) 8.6-50 MG tablet Take 1 tablet by mouth at bedtime as needed for mild constipation.   carbamide peroxide (DEBROX) 6.5 % OTIC solution Place 5 drops into both ears 2 (two) times daily. (Patient not taking: Reported on 04/19/2023)   diclofenac Sodium (VOLTAREN) 1 % GEL Apply 2 g topically 4 (four) times daily. (Patient not taking: Reported on 04/19/2023)   levalbuterol (XOPENEX) 0.63 MG/3ML nebulizer solution Inhale into the lungs. (Patient not taking: Reported on 04/19/2023)   No facility-administered encounter medications on file as of 04/19/2023.    Review of Systems  Constitutional:  Negative for activity change, appetite change, fatigue and unexpected weight change.  HENT:  Negative for congestion and hearing loss.   Eyes: Negative.   Respiratory:  Negative for cough and shortness of breath.   Cardiovascular:  Positive for chest pain. Negative for palpitations and leg swelling.  Gastrointestinal:  Negative for abdominal pain, constipation and diarrhea.  Genitourinary:  Negative for difficulty urinating and dysuria.  Musculoskeletal:  Negative for arthralgias and myalgias.  Skin:  Negative for color change and wound.  Neurological:  Negative for dizziness and weakness.  Psychiatric/Behavioral:  Negative for agitation, behavioral problems and confusion.      Immunization History  Administered Date(s) Administered   Diphtheria 06/07/2022   Influenza, High  Dose Seasonal PF 10/19/2015, 11/08/2016   Influenza,inj,quad, With Preservative 11/07/2017, 11/11/2018   Influenza-Unspecified 10/23/2013, 11/04/2014, 11/01/2017   Pneumococcal Conjugate-13 12/03/2015   RSV,unspecified 04/06/2023   Tdap 06/07/2022   Unspecified SARS-COV-2 Vaccination 03/19/2019, 04/16/2019, 01/08/2020   Pertinent  Health Maintenance Due  Topic Date Due   INFLUENZA VACCINE  04/23/2023 (Originally 08/24/2022)   Colonoscopy  08/24/2026      11/29/2020    1:14 PM 05/20/2021    2:18 PM 06/02/2021   10:24 AM 06/04/2021   10:53 AM 08/23/2021    9:37 AM  Fall Risk  (RETIRED) Patient Fall Risk Level Low fall risk Low fall risk Low fall  risk High fall risk Low fall risk   Functional Status Survey:    Vitals:   04/19/23 0903  BP: 128/77  Pulse: (!) 55  Resp: 18  Temp: 97.7 F (36.5 C)  SpO2: 98%  Weight: 176 lb 4.8 oz (80 kg)  Height: 5\' 10"  (1.778 m)   Body mass index is 25.3 kg/m. Physical Exam Constitutional:      General: He is not in acute distress.    Appearance: He is well-developed. He is not diaphoretic.  HENT:     Head: Normocephalic and atraumatic.     Right Ear: External ear normal.     Left Ear: External ear normal.     Mouth/Throat:     Pharynx: No oropharyngeal exudate.  Eyes:     Conjunctiva/sclera: Conjunctivae normal.     Pupils: Pupils are equal, round, and reactive to light.  Cardiovascular:     Rate and Rhythm: Normal rate and regular rhythm.     Heart sounds: Normal heart sounds.  Pulmonary:     Effort: Pulmonary effort is normal.     Breath sounds: Normal breath sounds.  Abdominal:     General: Bowel sounds are normal.     Palpations: Abdomen is soft.  Musculoskeletal:        General: No tenderness.     Cervical back: Normal range of motion and neck supple.     Right lower leg: No edema.     Left lower leg: No edema.  Skin:    General: Skin is warm and dry.  Neurological:     Mental Status: He is alert and oriented to person,  place, and time.     Labs reviewed: Recent Labs    03/28/23 0534 04/02/23 0445 04/05/23 0530 04/09/23 0000  NA 138 136 136 136*  K 4.3 4.3 4.3 4.3  CL 109 103 106 104  CO2 23 26 25  25*  GLUCOSE 91 95 94  --   BUN 29* 25* 22 18  CREATININE 1.26* 1.09 1.03 1.2  CALCIUM 8.6* 9.1 8.5* 8.6*   Recent Labs    09/01/22 1059 01/03/23 1340 03/27/23 1412  AST 28 19 21   ALT 21 16 17   ALKPHOS 51 47 44  BILITOT 1.0 0.7 0.9  PROT 7.2 6.7 7.5  ALBUMIN 3.5 3.5 3.7   Recent Labs    01/03/23 1340 03/27/23 1412 03/28/23 0534 04/02/23 0446 04/05/23 0530 04/09/23 0000  WBC 6.0   < > 8.7 9.2 7.3 5.8  NEUTROABS 3.5  --   --   --   --  3,219.00  HGB 13.3   < > 13.3 16.2 15.2 14.8  HCT 40.2   < > 38.4* 47.2 43.7 45  MCV 96.4   < > 93.7 92.5 94.0  --   PLT 214   < > 199 234 182 177   < > = values in this interval not displayed.   Lab Results  Component Value Date   TSH 3.114 03/29/2023   Lab Results  Component Value Date   HGBA1C 5.9 (H) 11/07/2016   Lab Results  Component Value Date   CHOL 128 10/03/2017   HDL 46 10/03/2017   LDLCALC 68 10/03/2017   TRIG 70 10/03/2017   CHOLHDL 2.8 10/03/2017    Significant Diagnostic Results in last 30 days:  MR BRAIN/IAC W WO CONTRAST Result Date: 04/05/2023 CLINICAL DATA:  Dizziness EXAM: MRI HEAD WITHOUT AND WITH CONTRAST TECHNIQUE: Multiplanar, multiecho pulse sequences of the brain and surrounding  structures were obtained without and with intravenous contrast. CONTRAST:  7mL GADAVIST GADOBUTROL 1 MMOL/ML IV SOLN COMPARISON:  03/28/2023 FINDINGS: Brain: No acute infarct, mass effect or extra-axial collection. No acute or chronic hemorrhage. There is multifocal hyperintense T2-weighted signal within the white matter. Parenchymal volume and CSF spaces are normal. The midline structures are normal. There is no cerebellopontine angle mass. The cochleae and semicircular canals are normal. No focal abnormality along the course of the 7th and  8th cranial nerves. Normal porus acusticus and vestibular aqueduct bilaterally. Vascular: Normal flow voids. Skull and upper cervical spine: Normal calvarium and skull base. Visualized upper cervical spine and soft tissues are normal. Sinuses/Orbits:No paranasal sinus fluid levels or advanced mucosal thickening. No mastoid or middle ear effusion. Normal orbits. IMPRESSION: 1. No acute intracranial abnormality. 2. Findings of chronic small vessel ischemia. 3. Normal internal auditory canal structures. Electronically Signed   By: Deatra Robinson M.D.   On: 04/05/2023 00:32   NM Myocar Multi W/Spect W/Wall Motion / EF Result Date: 04/04/2023   Findings are consistent with prior infarction given fixed defect that is not reversible and actually perfusion appears to be better during stress. Fixed perfusion defect in the apical and inferoapical region, Also with fixed perfusion defect in the basal inferior,inferolateral and lateral region The study is intermediate risk.   LV perfusion is abnormal. There is no evidence of ischemia. There is evidence of prior infarction given fixed defect. Fixed perfusion defect in the apical and inferoapical region, Also with fixed perfusion defect in the basal inferior,inferolateral and lateral region   Left ventricular function is abnormal 40-45%. Global function is mildly reduced. End diastolic cavity size is normal. End systolic cavity size is normal. This study is unchanged compared to prior nuc in 2020.   DG Chest 1 View Result Date: 04/04/2023 CLINICAL DATA:  Chest pain EXAM: CHEST  1 VIEW COMPARISON:  Chest x-ray 03/27/2023 FINDINGS: Sternotomy present. The heart size and mediastinal contours are within normal limits. Both lungs are clear. The visualized skeletal structures are unremarkable. IMPRESSION: No active disease. Electronically Signed   By: Darliss Cheney M.D.   On: 04/04/2023 00:49   MR CERVICAL SPINE WO CONTRAST Result Date: 03/29/2023 CLINICAL DATA:  Anterior cord  syndrome.  Stroke-like symptoms. EXAM: MRI CERVICAL SPINE WITHOUT CONTRAST TECHNIQUE: Multiplanar, multisequence MR imaging of the cervical spine was performed. No intravenous contrast was administered. COMPARISON:  MRI cervical spine 12/08/2009.  Neck MRA 06/04/2021. FINDINGS: Technical note: Despite efforts by the technologist and patient, mild motion artifact is present on today's exam and could not be eliminated. This reduces exam sensitivity and specificity. Alignment: Reversal of the usual cervical lordosis with a mildly increased degenerative anterolisthesis at the C2-3 and C3-4 levels compared with remote cervical MRI. Minimal retrolisthesis at C5-6. Vertebrae: No evidence of acute fracture or traumatic subluxation. Progressive multilevel spondylosis with endplate osteophytes and facet hypertrophy. Cord: Progressive multilevel spinal stenosis without significant cord deformity. No definite abnormal cord signal. Posterior Fossa, vertebral arteries, paraspinal tissues: Visualized portions of the posterior fossa appear unremarkable.Dominant left vertebral artery with preserved flow voids bilaterally. No significant paraspinal findings. Disc levels: C2-3: Preserved disc height. Progressive facet hypertrophy and anterolisthesis with resulting effacement of the CSF surrounding the cord. No significant cord deformity. Mild foraminal narrowing bilaterally. C3-4: Progressive loss of disc height with disc bulging, uncinate spurring and asymmetric facet hypertrophy on the left. Effacement of the CSF surrounding the cord without cord deformity. Moderate to severe foraminal narrowing bilaterally. C4-5: Progressive  spondylosis with loss of disc height and posterior osteophytes covering diffusely bulging disc material. Mild spinal stenosis without cord deformity. Chronic mild right and moderate left foraminal narrowing, similar to previous MRI. C5-6: Chronic spondylosis with loss of disc height and posterior osteophytes  covering diffusely bulging disc material. Similar chronic effacement of the CSF surrounding the cord, asymmetric to the right. No significant cord deformity. Severe right and moderate left foraminal narrowing appears progressive. C6-7: Chronic spondylosis with loss of disc height and posterior osteophytes covering diffusely bulging disc material. Mild spinal stenosis without cord deformity. Similar mild right and moderate left foraminal narrowing. C7-T1: Progressive loss of disc height with annular disc bulging, endplate osteophytes and bilateral facet hypertrophy, worse on the right. No significant central spinal stenosis. There is progressive foraminal narrowing, moderate on the right and mild on the left. IMPRESSION: 1. No acute findings or clear explanation for the patient's symptoms. 2. Progressive multilevel cervical spondylosis compared with remote MRI from 2011. There is resulting multilevel spinal stenosis without significant cord deformity or abnormal cord signal. 3. Multilevel foraminal narrowing, most severe bilaterally at C3-4 and on the right at C5-6. Electronically Signed   By: Carey Bullocks M.D.   On: 03/29/2023 16:55   US Carotid Bilateral Result Date: 03/29/2023 CLINICAL DATA:  83 year old male with stroke EXAM: BILATERAL CAROTID DUPLEX ULTRASOUND TECHNIQUE: Wallace Cullens scale imaging, color Doppler and duplex ultrasound were performed of bilateral carotid and vertebral arteries in the neck. COMPARISON:  None Available. FINDINGS: Criteria: Quantification of carotid stenosis is based on velocity parameters that correlate the residual internal carotid diameter with NASCET-based stenosis levels, using the diameter of the distal internal carotid lumen as the denominator for stenosis measurement. The following velocity measurements were obtained: RIGHT ICA:  Systolic 76 cm/sec, Diastolic 17 cm/sec CCA:  67 cm/sec SYSTOLIC ICA/CCA RATIO:  1.1 ECA:  97 cm/sec LEFT ICA:  Systolic 77 cm/sec, Diastolic 11 cm/sec  CCA:  103 cm/sec SYSTOLIC ICA/CCA RATIO:  0.7 ECA:  95 cm/sec Right Brachial SBP: Not acquired Left Brachial SBP: Not acquired RIGHT CAROTID ARTERY: No significant calcifications of the right common carotid artery. Intermediate waveform maintained. Heterogeneous and partially calcified plaque at the right carotid bifurcation. No significant lumen shadowing. Low resistance waveform of the right ICA. No significant tortuosity. RIGHT VERTEBRAL ARTERY: Antegrade flow with low resistance waveform. LEFT CAROTID ARTERY: No significant calcifications of the left common carotid artery. Intermediate waveform maintained. Heterogeneous and partially calcified plaque at the left carotid bifurcation. No significant lumen shadowing. Low resistance waveform of the left ICA. No significant tortuosity. LEFT VERTEBRAL ARTERY:  Antegrade flow with low resistance waveform. IMPRESSION: Color duplex indicates minimal heterogeneous and calcified plaque, with no hemodynamically significant stenosis by duplex criteria in the extracranial cerebrovascular circulation. Signed, Yvone Neu. Miachel Roux, RPVI [facility] Radiology Electronically Signed   By: Gilmer Mor D.O.   On: 03/29/2023 16:54   EEG adult Result Date: 03/29/2023 Charlsie Quest, MD     03/29/2023  4:22 PM Patient Name: NICKY KRAS MRN: [id number] Epilepsy Attending: Charlsie Quest Referring Physician/Provider: Pennie Banter, DO Date: 03/29/2023 Duration: 31.39 mins Patient history: 82yo m with syncope. EEG to evaluate for seizure Level of alertness: Awake, asleep AEDs during EEG study: None Technical aspects: This EEG study was done with scalp electrodes positioned according to the 10-20 International system of electrode placement. Electrical activity was reviewed with band pass filter of 1-70Hz , sensitivity of 7 uV/mm, display speed of 29mm/sec  with a 60Hz  notched filter applied as appropriate. EEG data were  recorded continuously and digitally stored.  Video monitoring was available and reviewed as appropriate. Description: The posterior dominant rhythm consists of 8-9 Hz activity of moderate voltage (25-35 uV) seen predominantly in posterior head regions, symmetric and reactive to eye opening and eye closing. Sleep was characterized by vertex waves, sleep spindles (12 to 14 Hz), maximal frontocentral region.  There is an excessive amount of 15 to 18 Hz, 2-3 uV beta activity Hyperventilation and photic stimulation were not performed.   IMPRESSION: This study is within normal limits. No seizures or epileptiform discharges were seen throughout the recording. A normal interictal EEG does not exclude the diagnosis of epilepsy. Charlsie Quest   MR BRAIN WO CONTRAST Result Date: 03/28/2023 CLINICAL DATA:  Provided history: Headache, new onset. Persistent dizziness. EXAM: MRI HEAD WITHOUT CONTRAST TECHNIQUE: Multiplanar, multiecho pulse sequences of the brain and surrounding structures were obtained without intravenous contrast. COMPARISON:  Head CT 03/27/2023. FINDINGS: Intermittently motion degraded examination. Most notably, the axial T2 and axial FLAIR sequences are moderately motion degraded. Within this limitation, findings are as follows. Brain: Mild generalized cerebral atrophy. Multifocal T2 FLAIR hyperintense signal abnormality within the cerebral white matter, nonspecific but compatible with mild chronic small vessel ischemic disease. Punctate chronic microhemorrhage within the left cerebellar hemisphere (series 10, image 14). No cortical encephalomalacia is identified. There is no acute infarct. No evidence of an intracranial mass. No extra-axial fluid collection. No midline shift. Vascular: Maintained flow voids within the proximal large arterial vessels. Skull and upper cervical spine: No focal worrisome marrow lesion. Incompletely assessed cervical spondylosis. At C3-C4, a central disc protrusion contributes  to apparent moderate spinal canal stenosis (with mild flattening of the ventral spinal cord). 3 mm C2-C3 grade 1 anterolisthesis. Sinuses/Orbits: No mass or acute finding within the imaged orbits. Prior bilateral ocular lens replacement. Minimal mucosal thickening scattered within bilateral ethmoid air cells. IMPRESSION: 1. Intermittently motion degraded examination. 2. No evidence of an acute intracranial abnormality. The diffusion-weighted imaging is of good quality and there is no evidence of an acute infarct. 3. Mild chronic small vessel ischemic changes within the cerebral white matter. 4. Mild generalized cerebral atrophy. 5. Minor ethmoid sinus mucosal thickening bilaterally. 6. Incompletely assessed cervical spondylosis. At C3-C4, a central disc protrusion contributes to apparent moderate spinal canal stenosis (with mild flattening of the ventral spinal cord). Electronically Signed   By: Jackey Loge D.O.   On: 03/28/2023 14:12   ECHOCARDIOGRAM COMPLETE Result Date: 03/28/2023    ECHOCARDIOGRAM REPORT   Patient Name:   TEOFIL MANIACI Date of Exam: 03/27/2023 Medical Rec #:  [id number]      Height:       70.0 in Accession #:    [id number]     Weight:       176.1 lb Date of Birth:  May 07, 1940       BSA:          1.978 m Patient Age:    82 years       BP:           127/63 mmHg Patient Gender: M              HR:           57 bpm. Exam Location:  [facility] Procedure: 2D Echo, Cardiac Doppler and Color Doppler (Both Spectral and Color            Flow Doppler were utilized during  procedure). Indications:     R55 Syncope  History:         Patient has prior history of Echocardiogram examinations, most                  recent 03/21/2019. Cardiomyopathy, CAD, Signs/Symptoms:Dyspnea                  and Syncope; Risk Factors:Hypertension and Dyslipidemia.                  Pulmonary Emboli.  Sonographer:     Daphine Deutscher RDCS Referring Phys:  [id number] AMY N COX Diagnosing Phys: Alwyn Pea MD IMPRESSIONS  1. Left  ventricular ejection fraction, by estimation, is 40 to 45%. The left ventricle has normal function. The left ventricle demonstrates global hypokinesis. Left ventricular diastolic parameters are consistent with Grade III diastolic dysfunction (restrictive).  2. Right ventricular systolic function is normal. The right ventricular size is normal.  3. Left atrial size was moderately dilated.  4. Right atrial size was moderately dilated.  5. The mitral valve is normal in structure. Mild mitral valve regurgitation.  6. The aortic valve is normal in structure. Aortic valve regurgitation is not visualized. FINDINGS  Left Ventricle: Left ventricular ejection fraction, by estimation, is 40 to 45%. The left ventricle has normal function. The left ventricle demonstrates global hypokinesis. The left ventricular internal cavity size was normal in size. There is no left ventricular hypertrophy. Left ventricular diastolic parameters are consistent with Grade III diastolic dysfunction (restrictive). Right Ventricle: The right ventricular size is normal. No increase in right ventricular wall thickness. Right ventricular systolic function is normal. Left Atrium: Left atrial size was moderately dilated. Right Atrium: Right atrial size was moderately dilated. Pericardium: There is no evidence of pericardial effusion. Mitral Valve: The mitral valve is normal in structure. Mild mitral valve regurgitation. Tricuspid Valve: The tricuspid valve is normal in structure. Tricuspid valve regurgitation is mild. Aortic Valve: The aortic valve is normal in structure. Aortic valve regurgitation is not visualized. Pulmonic Valve: The pulmonic valve was normal in structure. Pulmonic valve regurgitation is not visualized. Aorta: The ascending aorta was not well visualized. IAS/Shunts: No atrial level shunt detected by color flow Doppler.  LEFT VENTRICLE PLAX 2D LVIDd:         5.30 cm   Diastology LVIDs:         4.80 cm   LV e' medial:    5.60 cm/s LV  PW:         1.10 cm   LV E/e' medial:  16.8 LV IVS:        1.10 cm   LV e' lateral:   10.30 cm/s LVOT diam:     2.20 cm   LV E/e' lateral: 9.1 LV SV:         59 LV SV Index:   30 LVOT Area:     3.80 cm  RIGHT VENTRICLE            IVC RV Basal diam:  4.30 cm    IVC diam: 1.70 cm RV S prime:     9.20 cm/s TAPSE (M-mode): 2.2 cm LEFT ATRIUM           Index        RIGHT ATRIUM           Index LA diam:      4.90 cm 2.48 cm/m   RA Area:     18.50 cm LA Vol (A2C): 23.0  ml 11.63 ml/m  RA Volume:   51.30 ml  25.94 ml/m LA Vol (A4C): 44.5 ml 22.50 ml/m  AORTIC VALVE LVOT Vmax:   62.20 cm/s LVOT Vmean:  41.600 cm/s LVOT VTI:    0.156 m  AORTA Ao Root diam: 3.30 cm Ao Asc diam:  3.90 cm MITRAL VALVE MV Area (PHT): 3.78 cm    SHUNTS MV Decel Time: 201 msec    Systemic VTI:  0.16 m MV E velocity: 93.80 cm/s  Systemic Diam: 2.20 cm MV A velocity: 47.95 cm/s MV E/A ratio:  1.96 Dwayne D Callwood MD Electronically signed by Alwyn Pea MD Signature Date/Time: 03/28/2023/12:41:21 PM    Final    CT HEAD WO CONTRAST ( ) Result Date: 03/28/2023 CLINICAL DATA:  Neuro deficit, acute, stroke suspected Headache, neuro deficit Headache, new onset (Age >= 51y) EXAM: CT HEAD WITHOUT CONTRAST TECHNIQUE: Contiguous axial images were obtained from the base of the skull through the vertex without intravenous contrast. RADIATION DOSE REDUCTION: This exam was performed according to the departmental dose-optimization program which includes automated exposure control, adjustment of the mA and/or kV according to patient size and/or use of iterative reconstruction technique. COMPARISON:  MRI head and CT head Jun 04, 2021. FINDINGS: Brain: No evidence of acute infarction, hemorrhage, hydrocephalus, extra-axial collection or mass lesion/mass effect. Vascular: No hyperdense vessel. Skull: No acute fracture. Sinuses/Orbits: Clear sinuses.  No acute orbital findings. Other: No mastoid effusions. IMPRESSION: No evidence of acute intracranial  abnormality. Electronically Signed   By: Feliberto Harts M.D.   On: 03/28/2023 03:32   Portable Chest 1 View Result Date: 03/27/2023 CLINICAL DATA:  Dizziness with chest pain, initial encounter EXAM: PORTABLE CHEST 1 VIEW COMPARISON:  12/08/2022 FINDINGS: Postsurgical changes are again noted. Cardiac shadow is mildly prominent but accentuated by the portable technique. Lungs are well aerated bilaterally. No focal infiltrate or effusion is seen. No bony abnormality is noted. Mild interstitial changes are again seen. IMPRESSION: No active disease. Electronically Signed   By: Alcide Clever M.D.   On: 03/27/2023 22:26    Assessment/Plan 1. Coronary artery disease involving coronary bypass graft of native heart with angina pectoris (HCC) (Primary) Ongoing chest pain however no changes in EKG noted. Continues with event monitor and follow up with cardiology Continue medication as prescribed.  - ranolazine (RANEXA) 1000 MG SR tablet; Take 1 tablet (1,000 mg total) by mouth 2 (two) times daily.  Dispense: 60 tablet; Refill: 0 - isosorbide mononitrate (IMDUR) 60 MG 24 hr tablet; Take 1 tablet (60 mg total) by mouth 2 (two) times daily.  Dispense: 60 tablet; Refill: 0 - nitroGLYCERIN (NITROSTAT) 0.4 MG SL tablet; Place 1 tablet (0.4 mg total) under the tongue every 5 (five) minutes as needed for chest pain.  Dispense: 30 tablet; Refill: 0  2. Chronic diastolic heart failure (HCC) Euvolemic at this time.   3. Orthostatic hypotension Dizziness has improved at this time. Continues on midodrine TID - midodrine (PROAMATINE) 10 MG tablet; Take 1 tablet (10 mg total) by mouth 3 (three) times daily with meals.  Dispense: 90 tablet; Refill: 0  4. PAF (paroxysmal atrial fibrillation) (HCC) Rate controlled, continues on eliquis for anticoagulation  - ELIQUIS 2.5 MG TABS tablet; Take 1 tablet (2.5 mg total) by mouth 2 (two) times daily.  Dispense: 60 tablet; Refill: 0  5. Mixed hyperlipidemia - ezetimibe (ZETIA)  10 MG tablet; Take 1 tablet (10 mg total) by mouth daily.  Dispense: 30 tablet; Refill: 0 - atorvastatin (LIPITOR) 80  MG tablet; Take 1 tablet (80 mg total) by mouth daily.  Dispense: 30 tablet; Refill: 0   pt is stable for discharge-will need PT/OT per home health. DME needed includes walker. Rx sent via epic for 1 month supply,  will need to follow up with PCP within 2 weeks.  He questions if he is safe to drive- concerns by therapy noted by patient due to dizziness, will have OT perform safe driving evaluation.     Janene Harvey. [facility] [phone]

## 2023-04-30 ENCOUNTER — Other Ambulatory Visit: Payer: Self-pay | Admitting: Pulmonary Disease

## 2023-04-30 DIAGNOSIS — R042 Hemoptysis: Secondary | ICD-10-CM

## 2023-05-09 ENCOUNTER — Inpatient Hospital Stay: Payer: Medicare HMO | Attending: Oncology

## 2023-05-09 DIAGNOSIS — Z86711 Personal history of pulmonary embolism: Secondary | ICD-10-CM | POA: Diagnosis not present

## 2023-05-09 DIAGNOSIS — I480 Paroxysmal atrial fibrillation: Secondary | ICD-10-CM | POA: Diagnosis not present

## 2023-05-09 DIAGNOSIS — Z7901 Long term (current) use of anticoagulants: Secondary | ICD-10-CM | POA: Diagnosis not present

## 2023-05-09 DIAGNOSIS — R0602 Shortness of breath: Secondary | ICD-10-CM

## 2023-05-09 DIAGNOSIS — E611 Iron deficiency: Secondary | ICD-10-CM

## 2023-05-09 DIAGNOSIS — D509 Iron deficiency anemia, unspecified: Secondary | ICD-10-CM | POA: Diagnosis present

## 2023-05-09 DIAGNOSIS — I2699 Other pulmonary embolism without acute cor pulmonale: Secondary | ICD-10-CM

## 2023-05-09 LAB — CBC WITH DIFFERENTIAL/PLATELET
Abs Immature Granulocytes: 0.02 10*3/uL (ref 0.00–0.07)
Basophils Absolute: 0 10*3/uL (ref 0.0–0.1)
Basophils Relative: 0 %
Eosinophils Absolute: 0.1 10*3/uL (ref 0.0–0.5)
Eosinophils Relative: 1 %
HCT: 43.8 % (ref 39.0–52.0)
Hemoglobin: 14.4 g/dL (ref 13.0–17.0)
Immature Granulocytes: 0 %
Lymphocytes Relative: 26 %
Lymphs Abs: 1.9 10*3/uL (ref 0.7–4.0)
MCH: 31.9 pg (ref 26.0–34.0)
MCHC: 32.9 g/dL (ref 30.0–36.0)
MCV: 96.9 fL (ref 80.0–100.0)
Monocytes Absolute: 0.6 10*3/uL (ref 0.1–1.0)
Monocytes Relative: 8 %
Neutro Abs: 4.8 10*3/uL (ref 1.7–7.7)
Neutrophils Relative %: 65 %
Platelets: 178 10*3/uL (ref 150–400)
RBC: 4.52 MIL/uL (ref 4.22–5.81)
RDW: 14.1 % (ref 11.5–15.5)
WBC: 7.4 10*3/uL (ref 4.0–10.5)
nRBC: 0 % (ref 0.0–0.2)

## 2023-05-09 LAB — IRON AND TIBC
Iron: 97 ug/dL (ref 45–182)
Saturation Ratios: 29 % (ref 17.9–39.5)
TIBC: 332 ug/dL (ref 250–450)
UIBC: 235 ug/dL

## 2023-05-09 LAB — FERRITIN: Ferritin: 34 ng/mL (ref 24–336)

## 2023-05-16 ENCOUNTER — Encounter: Payer: Self-pay | Admitting: Oncology

## 2023-05-16 ENCOUNTER — Inpatient Hospital Stay: Payer: Medicare HMO

## 2023-05-16 ENCOUNTER — Inpatient Hospital Stay: Payer: Medicare HMO | Admitting: Oncology

## 2023-05-16 VITALS — BP 132/67 | HR 51 | Temp 96.9°F | Resp 18 | Wt 178.0 lb

## 2023-05-16 DIAGNOSIS — E611 Iron deficiency: Secondary | ICD-10-CM

## 2023-05-16 DIAGNOSIS — D509 Iron deficiency anemia, unspecified: Secondary | ICD-10-CM | POA: Diagnosis not present

## 2023-05-16 DIAGNOSIS — I2699 Other pulmonary embolism without acute cor pulmonale: Secondary | ICD-10-CM | POA: Diagnosis not present

## 2023-05-16 NOTE — Assessment & Plan Note (Signed)
 Labs are reviewed and discussed with patient. Lab Results  Component Value Date   HGB 14.4 05/09/2023   TIBC 332 05/09/2023   IRONPCTSAT 29 05/09/2023   FERRITIN 34 05/09/2023    Hemoglobin is within normal limit.  Iron  panel normalized.  No need for additional IV venofer .

## 2023-05-16 NOTE — Progress Notes (Signed)
 Hematology/Oncology Progress note [phone] [phone]     Patient Care Team: Little Riff, MD as PCP - General (Internal Medicine) Jerelene Monday Deadra Everts, MD as PCP - Cardiology (Cardiology) Alan Hoyer, MD (Internal Medicine) Charlette Console, FNP as Nurse Practitioner (Cardiology) Devorah Fonder, MD as Consulting Physician (Cardiology) Timmy Forbes, MD as Consulting Physician (Oncology)  REASON FOR VISIT Follow up for iron  deficiency anemia, pulmonary embolism   ASSESSMENT & PLAN:   Iron  deficiency Labs are reviewed and discussed with patient. Lab Results  Component Value Date   HGB 14.4 05/09/2023   TIBC 332 05/09/2023   IRONPCTSAT 29 05/09/2023   FERRITIN 34 05/09/2023    Hemoglobin is within normal limit.  Iron  panel normalized.  No need for additional IV venofer .    Pulmonary embolus (HCC) History of PE  Recommend patient to continue Eliquis  2.5mg  BID.   Eliquis  prescription is not managed by me.   Orders Placed This Encounter  Procedures   CBC with Differential (Cancer Center Only)    Standing Status:   Future    Expected Date:   11/15/2023    Expiration Date:   05/15/2024   Iron  and TIBC    Standing Status:   Future    Expected Date:   11/15/2023    Expiration Date:   05/15/2024   Ferritin    Standing Status:   Future    Expected Date:   11/15/2023    Expiration Date:   05/15/2024   Follow-up in 6 months. All questions were answered. The patient knows to call the clinic with any problems, questions or concerns.  Timmy Forbes, MD, PhD [facility] Hematology Oncology 05/16/2023  '   HISTORY OF PRESENTING ILLNESS: Scott [name] 83 y.o. male with PMH listed as below who is referred to me for evaluation and management of pulmonary embolism.  Scott [name] has a history paroxymal atrial fibrillation on Eliquis  5mg  BID until recently, history of CAD s/p CABG x 2 at [facility] in 2016 with chronic chest pain, s/p removal of sternal wire  on 03/2016,  s/p sternal debridement and plating on 05/25/2016 in the setting of CT chest performed by pain management showing sternal nonunion of the manubrium with subsequent improvement in his pain. Perioperatively his anticoagulation with Eliquis  was discontinue a few days prior surgery and resumed after surgery. Operation note mentioned that during the procedure, the left lung was densely adherent to the underside of the sternum and tear in the lung did occur and was sutured. He presents  to [facility] on 08/04/2016 for evaluation of SOB and was found to have a PE. Patient states that he was compliant with Eliquis .  CTA chest showed left upper lobe PE extending into the left main pulmonary artery. His Eliquis  was changed to Xarelto . Lower extremity dopplers were negative for DVT. Hypercoagulable workup was sent and all tests were negative except a mild low protein S function.  No formal cancer workup.  Sed rate normal, heavy metals negative, TSH mildly reduced at 0.253. 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     Hypercoagulable work up: discussed with patient. He is tested negative for factor 5 leiden mutation, prothrombin mutation, anti phospholipin antibody panels. Normal protein C activity. Normal total protein S, mildly low protein S function, can be falsely low in the acute setting of thrombosis.Congential protein S deficiency is unlikely given that he has no prior  history of VTE. Repeat protein S total and free were normal.   # He has had one dose of Feraheme  and during the treatment he had a chest tightness and end of infusion. Has received IV iron  Venofer  and tolerates well.   #chronic intermittent chest pain and shortness of breath that has been previously extensively worked up.  He follows up with Dr. Gara July for his heart care.  Extensive cardiology disease CAD status post prior RCA and LAD stenting as well as CABG x2 chronic  chest pain status post sternal wire removal with sternal debridement and plating, ischemia cardiomyopathy, chronic dyspnea.  # CT angios chest PE protocol done on 11/07/2016 showed no lung nodules or mass.  INTERVAL HISTORY Patient presents for follow-up of chronic anticoagulation, iron  deficiency present for follow up .  + Chronic fatigue and shortness of breath. + Intermittent chronic chest discomfort.  Review of Systems  Constitutional:  Positive for fatigue. Negative for appetite change, chills, fever and unexpected weight change.  HENT:   Negative for hearing loss and voice change.   Eyes:  Negative for eye problems and icterus.  Respiratory:  Positive for shortness of breath. Negative for chest tightness and cough.   Cardiovascular:  Negative for chest pain and leg swelling.  Gastrointestinal:  Negative for abdominal distention and abdominal pain.  Endocrine: Negative for hot flashes.  Genitourinary:  Negative for difficulty urinating, dysuria and frequency.   Musculoskeletal:  Negative for arthralgias.  Skin:  Negative for itching and rash.  Neurological:  Negative for light-headedness and numbness.  Hematological:  Negative for adenopathy. Does not bruise/bleed easily.  Psychiatric/Behavioral:  Negative for confusion.     MEDICAL HISTORY: Past Medical History:  Diagnosis Date   Arthritis    right hip, since a fall   CAD (coronary artery disease)    a. 04/2012 Cath: 3VD->Med Rx;  b. 04/2013 PCI RCA (2 DES); c. 03/2014 PCI: LAD 80 (3.0x23 Xience Alpine DES); d. 06/2014 CABG x 2 ([facility]) LIMA->LAD, VG->OM; e. 12/2014 Cath ([facility]): patent grafts->Med Rx; f. 08/2015 Cath: patent grafts; g. 11/2016 Cath: LM min irregs, LAD 20p, 15m, LCX 100p/m, RCA 20ost, 10p/m/d, VG->OM1 nl, LIMA->dLAD nl; f. 07/2017 MV: No isch, EF 51%.   Cardiomyopathy, ischemic    a. 04/2012 Echo: EF 40-45%;  b. 08/2013 Echo: EF 45-50%; c. 01/2015 Echo: EF 40-45%; d. 07/2016 Echo: EF 60-65%; e. 09/2017 Echo: EF 55-60%, Gr1  DD.   Carotid arterial disease (HCC)    a. 05/2013 Carotid U/S; bilat 40-50% ICA stenosis.   Chronic Chest Pain    USES NITRO   Chronic systolic CHF (congestive heart failure) (HCC)    a. 01/2015 Echo: EF 40-45%; b. 07/2016 Echo: EF 60-65%; c. 09/2017 Echo: EF 55-60%, Gr1 DD, nl RV fxn.   Cough    Diverticulosis    Dizziness    Dyspnea    WITH EXERTION   Headache 1970's   migraine   History of blood transfusion 2016   post op   Hyperlipidemia    Hypertension    Iron  deficiency anemia due to chronic blood loss 09/11/2016   Myocardial infarction [facility])    unsure of when   Pulmonary emboli (HCC) 07/2016   a. On Xarelto .   Reflux esophagitis    Sternal pain    a. 03/2016 s/p Sternal wire removal; b. 05/2016 s/p redo median sternotomy for sternal debridement and sternal plating.   Syncope 04/2018   CARDIOLOGIST WAS DR Jerelene Monday AND HE WAS AWARE-NOW PT SEES  PARASCHOS   Tubular adenoma of colon     SURGICAL HISTORY: Past Surgical History:  Procedure Laterality Date   CARDIAC CATHETERIZATION  05/01/2012   CARDIAC CATHETERIZATION  04/2013   [facility];x3 stent   CARDIAC CATHETERIZATION  01/11/15    [facility]   CARDIAC CATHETERIZATION N/A 09/16/2015   Procedure: LEFT HEART CATH AND CORS/GRAFTS ANGIOGRAPHY;  Surgeon: Devorah Fonder, MD;  Location: [facility] INVASIVE CV LAB;  Service: Cardiovascular;  Laterality: N/A;   CARPAL TUNNEL RELEASE     right hand   CATARACT EXTRACTION     LEFT   CATARACT EXTRACTION W/PHACO Left 09/16/2014   Procedure: CATARACT EXTRACTION PHACO AND INTRAOCULAR LENS PLACEMENT (IOC);  Surgeon: Annell Kidney, MD;  Location: [facility];  Service: Ophthalmology;  Laterality: Left;   COLONOSCOPY     COLONOSCOPY WITH PROPOFOL  N/A 01/28/2016   Procedure: COLONOSCOPY WITH PROPOFOL ;  Surgeon: Deveron Fly, MD;  Location: [facility] ENDOSCOPY;  Service: Endoscopy;  Laterality: N/A;   COLONOSCOPY WITH PROPOFOL  N/A 08/23/2021   Procedure: COLONOSCOPY WITH PROPOFOL ;  Surgeon: Selena Daily, MD;  Location: [facility] ENDOSCOPY;  Service: Gastroenterology;  Laterality: N/A;  REQUESTS 9AM OR LATER   CORONARY ANGIOPLASTY WITH STENT PLACEMENT  04/13/2014   CORONARY ARTERY BYPASS GRAFT  06-26-14   x3 bypasses   CORONARY PRESSURE/FFR WITH 3D MAPPING N/A 12/10/2018   Procedure: Silvestre Drum PRESSURE WIRE/FFR STUDY;  Surgeon: Percival Brace, MD;  Location: [facility] INVASIVE CV LAB;  Service: Cardiovascular;  Laterality: N/A;   CORONARY ULTRASOUND/IVUS N/A 12/10/2018   Procedure: Intravascular Ultrasound/IVUS;  Surgeon: Percival Brace, MD;  Location: [facility] INVASIVE CV LAB;  Service: Cardiovascular;  Laterality: N/A;   CYSTOSCOPY N/A 08/14/2018   Procedure: CYSTOSCOPY;  Surgeon: Geraline Knapp, MD;  Location: [facility] ORS;  Service: Urology;  Laterality: N/A;   DE QUERVAIN'S RELEASE Left 08/22/2012   ESOPHAGOGASTRODUODENOSCOPY (EGD) WITH PROPOFOL  N/A 04/26/2015   Procedure: ESOPHAGOGASTRODUODENOSCOPY (EGD) WITH PROPOFOL ;  Surgeon: Stephens Eis, MD;  Location: [facility] ENDOSCOPY;  Service: Gastroenterology;  Laterality: N/A;   ESOPHAGOGASTRODUODENOSCOPY (EGD) WITH PROPOFOL  N/A 05/29/2017   Procedure: ESOPHAGOGASTRODUODENOSCOPY (EGD) WITH PROPOFOL ;  Surgeon: Deveron Fly, MD;  Location: [facility] ENDOSCOPY;  Service: Endoscopy;  Laterality: N/A;   LEFT HEART CATH AND CORONARY ANGIOGRAPHY N/A 12/04/2016   Procedure: LEFT HEART CATH AND CORS/GRAFTS  ANGIOGRAPHY;  Surgeon: Wenona Hamilton, MD;  Location: [facility] INVASIVE CV LAB;  Service: Cardiovascular;  Laterality: N/A;   LEFT HEART CATH AND CORS/GRAFTS ANGIOGRAPHY N/A 04/09/2018   Procedure: LEFT HEART CATH AND CORS/GRAFTS ANGIOGRAPHY;  Surgeon: Percival Brace, MD;  Location: [facility] INVASIVE CV LAB;  Service: Cardiovascular;  Laterality: N/A;   LEFT HEART CATH AND CORS/GRAFTS ANGIOGRAPHY N/A 12/10/2018   Procedure: LEFT HEART CATH AND CORS/GRAFTS ANGIOGRAPHY;  Surgeon: Percival Brace, MD;  Location: [facility] INVASIVE CV LAB;  Service:  Cardiovascular;  Laterality: N/A;   LEFT HEART CATH AND CORS/GRAFTS ANGIOGRAPHY Left 05/25/2020   Procedure: LEFT HEART CATH AND CORS/GRAFTS ANGIOGRAPHY;  Surgeon: Percival Brace, MD;  Location: [facility] INVASIVE CV LAB;  Service: Cardiovascular;  Laterality: Left;   RIB PLATING N/A 05/25/2016   Procedure: STERNAL PLATING;  Surgeon: Zelphia Higashi, MD;  Location: [facility] OR;  Service: Thoracic;  Laterality: N/A;   right shoulder     STERNAL WIRES REMOVAL N/A 03/30/2016   Procedure: STERNAL WIRES REMOVAL;  Surgeon: Zelphia Higashi, MD;  Location: [facility] OR;  Service: Thoracic;  Laterality: N/A;   TONSILLECTOMY      SOCIAL HISTORY: Social History  Socioeconomic History   Marital status: Married    Spouse name: Not on file   Number of children: Not on file   Years of education: Not on file   Highest education level: Not on file  Occupational History   Not on file  Tobacco Use   Smoking status: Former    Current packs/day: 0.00    Average packs/day: 1 pack/day for 35.0 years (35.0 ttl pk-yrs)    Types: Cigarettes    Start date: 72    Quit date: 2009    Years since quitting: 16.3   Smokeless tobacco: Former  Building services engineer status: Never Used  Substance and Sexual Activity   Alcohol use: No   Drug use: No   Sexual activity: Yes  Other Topics Concern   Not on file  Social History Narrative   Not on file   Social Drivers of Health   Financial Resource Strain: Low Risk  (05/10/2023)   Received from [facility]   Overall Financial Resource Strain (CARDIA)    Difficulty of Paying Living Expenses: Not hard at all  Food Insecurity: No Food Insecurity (05/10/2023)   Received from [facility]   Hunger Vital Sign    Worried About Running Out of Food in the Last Year: Never true    Ran Out of Food in the Last Year: Never true  Transportation Needs: No Transportation Needs (05/10/2023)   Received from [facility]  - Transportation    In the past 12 months, has lack of transportation kept you from medical appointments or from getting medications?: No    Lack of Transportation (Non-Medical): No  Physical Activity: Not on file  Stress: Not on file  Social Connections: Moderately Integrated (04/04/2023)   Social Connection and Isolation Panel [NHANES]    Frequency of Communication with Friends and Family: Three times a week    Frequency of Social Gatherings with Friends and Family: More than three times a week    Attends Religious Services: More than 4 times per year    Active Member of [employer] or Organizations: No    Attends Banker Meetings: Patient declined    Marital Status: Married  Catering manager Violence: Not At Risk (03/27/2023)   Humiliation, Afraid, Rape, and Kick questionnaire    Fear of Current or Ex-Partner: No    Emotionally Abused: No    Physically Abused: No    Sexually Abused: No    FAMILY HISTORY Family History  Problem Relation Age of Onset   Heart attack Father 52       MI   Cancer Maternal Uncle     ALLERGIES:  is allergic to contrast media [iodinated contrast media], metrizamide, oxycodone hcl, and vicodin [hydrocodone-acetaminophen ].  MEDICATIONS:  Current Outpatient Medications  Medication Sig Dispense Refill   atorvastatin  (LIPITOR ) 80 MG tablet Take 1 tablet (80 mg total) by mouth daily. 30 tablet 0   cyanocobalamin  1000 MCG tablet Take 1 tablet (1,000 mcg total) by mouth daily. 30 tablet 0   ELIQUIS  2.5 MG TABS tablet Take 1 tablet (2.5 mg total) by mouth 2 (two) times daily. 60 tablet 0   ezetimibe  (ZETIA ) 10 MG tablet Take 1 tablet (10 mg total) by mouth daily. 30 tablet 0   Ipratropium-Albuterol  (COMBIVENT) 20-100 MCG/ACT AERS respimat Inhale 2 puffs into the lungs 4 (four) times daily as needed for wheezing.     isosorbide  mononitrate (IMDUR ) 60 MG 24  hr tablet Take 1 tablet (60 mg total) by mouth 2 (two) times daily. 60 tablet 0   midodrine   (PROAMATINE ) 10 MG tablet Take 1 tablet (10 mg total) by mouth 3 (three) times daily with meals. 90 tablet 0   nitroGLYCERIN  (NITROSTAT ) 0.4 MG SL tablet Place 1 tablet (0.4 mg total) under the tongue every 5 (five) minutes as needed for chest pain. 30 tablet 0   ranolazine  (RANEXA ) 1000 MG SR tablet Take 1 tablet (1,000 mg total) by mouth 2 (two) times daily. 60 tablet 0   senna-docusate (SENOKOT-S) 8.6-50 MG tablet Take 1 tablet by mouth at bedtime as needed for mild constipation. 30 tablet 0   carbamide peroxide (DEBROX) 6.5 % OTIC solution Place 5 drops into both ears 2 (two) times daily. (Patient not taking: Reported on 04/12/2023) 15 mL 0   diclofenac  Sodium (VOLTAREN ) 1 % GEL Apply 2 g topically 4 (four) times daily. (Patient not taking: Reported on 04/12/2023) 100 g 0   levalbuterol (XOPENEX) 0.63 MG/3ML nebulizer solution Inhale into the lungs. (Patient not taking: Reported on 04/12/2023)     No current facility-administered medications for this visit.    PHYSICAL EXAMINATION:  ECOG PERFORMANCE STATUS: 1 - Symptomatic but completely ambulatory   Vitals:   05/16/23 1306  BP: 132/67  Pulse: (!) 51  Resp: 18  Temp: (!) 96.9 F (36.1 C)  SpO2: 98%    Filed Weights   05/16/23 1306  Weight: 178 lb (80.7 kg)     Physical Exam Constitutional:      General: He is not in acute distress.    Appearance: He is not diaphoretic.  HENT:     Head: Normocephalic and atraumatic.  Eyes:     General: No scleral icterus. Cardiovascular:     Rate and Rhythm: Normal rate and regular rhythm.  Pulmonary:     Effort: Pulmonary effort is normal. No respiratory distress.     Breath sounds: Normal breath sounds.  Abdominal:     General: Bowel sounds are normal. There is no distension.     Palpations: Abdomen is soft.  Musculoskeletal:        General: Normal range of motion.     Cervical back: Normal range of motion and neck supple.  Skin:    General: Skin is warm and dry.     Findings:  No erythema.  Neurological:     Mental Status: He is alert and oriented to person, place, and time. Mental status is at baseline.     Cranial Nerves: No cranial nerve deficit.     Motor: No abnormal muscle tone.  Psychiatric:        Mood and Affect: Affect normal.         LABORATORY DATA: I have personally reviewed the data as listed:  Appointment on 05/09/2023  Component Date Value Ref Range Status   WBC 05/09/2023 7.4  4.0 - 10.5 K/uL Final   RBC 05/09/2023 4.52  4.22 - 5.81 MIL/uL Final   Hemoglobin 05/09/2023 14.4  13.0 - 17.0 g/dL Final   HCT 04/54/0981 43.8  39.0 - 52.0 % Final   MCV 05/09/2023 96.9  80.0 - 100.0 fL Final   MCH 05/09/2023 31.9  26.0 - 34.0 pg Final   MCHC 05/09/2023 32.9  30.0 - 36.0 g/dL Final   RDW 19/14/7829 14.1  11.5 - 15.5 % Final   Platelets 05/09/2023 178  150 - 400 K/uL Final   nRBC 05/09/2023 0.0  0.0 - 0.2 %  Final   Neutrophils Relative % 05/09/2023 65  % Final   Neutro Abs 05/09/2023 4.8  1.7 - 7.7 K/uL Final   Lymphocytes Relative 05/09/2023 26  % Final   Lymphs Abs 05/09/2023 1.9  0.7 - 4.0 K/uL Final   Monocytes Relative 05/09/2023 8  % Final   Monocytes Absolute 05/09/2023 0.6  0.1 - 1.0 K/uL Final   Eosinophils Relative 05/09/2023 1  % Final   Eosinophils Absolute 05/09/2023 0.1  0.0 - 0.5 K/uL Final   Basophils Relative 05/09/2023 0  % Final   Basophils Absolute 05/09/2023 0.0  0.0 - 0.1 K/uL Final   Immature Granulocytes 05/09/2023 0  % Final   Abs Immature Granulocytes 05/09/2023 0.02  0.00 - 0.07 K/uL Final   Performed at [facility], [address] [place], [place] [address]   Ferritin 05/09/2023 34  24 - 336 ng/mL Final   Performed at [facility], [address] [place], [place] [address]   Iron  05/09/2023 97  45 - 182 ug/dL Final   TIBC 82/95/6213 332  250 - 450 ug/dL Final   Saturation Ratios 05/09/2023 29  17.9 - 39.5 % Final   UIBC 05/09/2023 235  ug/dL Final   Performed at [facility],  [address] [place], [place] [address]   Lab Results  Component Value Date   IRON  97 05/09/2023   TIBC 332 05/09/2023   IRONPCTSAT 29 05/09/2023   Lab Results  Component Value Date   FERRITIN 34 05/09/2023    RADIOGRAPHIC STUDIES: I have personally reviewed the radiological images as listed and agree with the findings in the report MR BRAIN/IAC W WO CONTRAST Result Date: 04/05/2023 CLINICAL DATA:  Dizziness EXAM: MRI HEAD WITHOUT AND WITH CONTRAST TECHNIQUE: Multiplanar, multiecho pulse sequences of the brain and surrounding structures were obtained without and with intravenous contrast. CONTRAST:  7mL GADAVIST  GADOBUTROL  1 MMOL/ML IV SOLN COMPARISON:  03/28/2023 FINDINGS: Brain: No acute infarct, mass effect or extra-axial collection. No acute or chronic hemorrhage. There is multifocal hyperintense T2-weighted signal within the white matter. Parenchymal volume and CSF spaces are normal. The midline structures are normal. There is no cerebellopontine angle mass. The cochleae and semicircular canals are normal. No focal abnormality along the course of the 7th and 8th cranial nerves. Normal porus acusticus and vestibular aqueduct bilaterally. Vascular: Normal flow voids. Skull and upper cervical spine: Normal calvarium and skull base. Visualized upper cervical spine and soft tissues are normal. Sinuses/Orbits:No paranasal sinus fluid levels or advanced mucosal thickening. No mastoid or middle ear effusion. Normal orbits. IMPRESSION: 1. No acute intracranial abnormality. 2. Findings of chronic small vessel ischemia. 3. Normal internal auditory canal structures. Electronically Signed   By: Juanetta Nordmann M.D.   On: 04/05/2023 00:32   NM Myocar Multi W/Spect W/Wall Motion / EF Result Date: 04/04/2023   Findings are consistent with prior infarction given fixed defect that is not reversible and actually perfusion appears to be better during stress. Fixed perfusion defect in the apical and  inferoapical region, Also with fixed perfusion defect in the basal inferior,inferolateral and lateral region The study is intermediate risk.   LV perfusion is abnormal. There is no evidence of ischemia. There is evidence of prior infarction given fixed defect. Fixed perfusion defect in the apical and inferoapical region, Also with fixed perfusion defect in the basal inferior,inferolateral and lateral region   Left ventricular function is abnormal 40-45%. Global function is mildly reduced. End diastolic cavity size is normal.  End systolic cavity size is normal. This study is unchanged compared to prior nuc in 2020.   DG Chest 1 View Result Date: 04/04/2023 CLINICAL DATA:  Chest pain EXAM: CHEST  1 VIEW COMPARISON:  Chest x-ray 03/27/2023 FINDINGS: Sternotomy present. The heart size and mediastinal contours are within normal limits. Both lungs are clear. The visualized skeletal structures are unremarkable. IMPRESSION: No active disease. Electronically Signed   By: Tyron Gallon M.D.   On: 04/04/2023 00:49   MR CERVICAL SPINE WO CONTRAST Result Date: 03/29/2023 CLINICAL DATA:  Anterior cord syndrome.  Stroke-like symptoms. EXAM: MRI CERVICAL SPINE WITHOUT CONTRAST TECHNIQUE: Multiplanar, multisequence MR imaging of the cervical spine was performed. No intravenous contrast was administered. COMPARISON:  MRI cervical spine 12/08/2009.  Neck MRA 06/04/2021. FINDINGS: Technical note: Despite efforts by the technologist and patient, mild motion artifact is present on today's exam and could not be eliminated. This reduces exam sensitivity and specificity. Alignment: Reversal of the usual cervical lordosis with a mildly increased degenerative anterolisthesis at the C2-3 and C3-4 levels compared with remote cervical MRI. Minimal retrolisthesis at C5-6. Vertebrae: No evidence of acute fracture or traumatic subluxation. Progressive multilevel spondylosis with endplate osteophytes and facet hypertrophy. Cord: Progressive  multilevel spinal stenosis without significant cord deformity. No definite abnormal cord signal. Posterior Fossa, vertebral arteries, paraspinal tissues: Visualized portions of the posterior fossa appear unremarkable.Dominant left vertebral artery with preserved flow voids bilaterally. No significant paraspinal findings. Disc levels: C2-3: Preserved disc height. Progressive facet hypertrophy and anterolisthesis with resulting effacement of the CSF surrounding the cord. No significant cord deformity. Mild foraminal narrowing bilaterally. C3-4: Progressive loss of disc height with disc bulging, uncinate spurring and asymmetric facet hypertrophy on the left. Effacement of the CSF surrounding the cord without cord deformity. Moderate to severe foraminal narrowing bilaterally. C4-5: Progressive spondylosis with loss of disc height and posterior osteophytes covering diffusely bulging disc material. Mild spinal stenosis without cord deformity. Chronic mild right and moderate left foraminal narrowing, similar to previous MRI. C5-6: Chronic spondylosis with loss of disc height and posterior osteophytes covering diffusely bulging disc material. Similar chronic effacement of the CSF surrounding the cord, asymmetric to the right. No significant cord deformity. Severe right and moderate left foraminal narrowing appears progressive. C6-7: Chronic spondylosis with loss of disc height and posterior osteophytes covering diffusely bulging disc material. Mild spinal stenosis without cord deformity. Similar mild right and moderate left foraminal narrowing. C7-T1: Progressive loss of disc height with annular disc bulging, endplate osteophytes and bilateral facet hypertrophy, worse on the right. No significant central spinal stenosis. There is progressive foraminal narrowing, moderate on the right and mild on the left. IMPRESSION: 1. No acute findings or clear explanation for the patient's symptoms. 2. Progressive multilevel cervical  spondylosis compared with remote MRI from 2011. There is resulting multilevel spinal stenosis without significant cord deformity or abnormal cord signal. 3. Multilevel foraminal narrowing, most severe bilaterally at C3-4 and on the right at C5-6. Electronically Signed   By: Elmon Hagedorn M.D.   On: 03/29/2023 16:55   US  Carotid Bilateral Result Date: 03/29/2023 CLINICAL DATA:  83 year old male with stroke EXAM: BILATERAL CAROTID DUPLEX ULTRASOUND TECHNIQUE: Martina Sledge scale imaging, color Doppler and duplex ultrasound were performed of bilateral carotid and vertebral arteries in the neck. COMPARISON:  None Available. FINDINGS: Criteria: Quantification of carotid stenosis is based on velocity parameters that correlate the residual internal carotid diameter with NASCET-based stenosis levels, using the diameter of the distal internal carotid lumen as the denominator  for stenosis measurement. The following velocity measurements were obtained: RIGHT ICA:  Systolic 76 cm/sec, Diastolic 17 cm/sec CCA:  67 cm/sec SYSTOLIC ICA/CCA RATIO:  1.1 ECA:  97 cm/sec LEFT ICA:  Systolic 77 cm/sec, Diastolic 11 cm/sec CCA:  103 cm/sec SYSTOLIC ICA/CCA RATIO:  0.7 ECA:  95 cm/sec Right Brachial SBP: Not acquired Left Brachial SBP: Not acquired RIGHT CAROTID ARTERY: No significant calcifications of the right common carotid artery. Intermediate waveform maintained. Heterogeneous and partially calcified plaque at the right carotid bifurcation. No significant lumen shadowing. Low resistance waveform of the right ICA. No significant tortuosity. RIGHT VERTEBRAL ARTERY: Antegrade flow with low resistance waveform. LEFT CAROTID ARTERY: No significant calcifications of the left common carotid artery. Intermediate waveform maintained. Heterogeneous and partially calcified plaque at the left carotid bifurcation. No significant lumen shadowing. Low resistance waveform of the left ICA. No significant tortuosity. LEFT VERTEBRAL ARTERY:  Antegrade flow  with low resistance waveform. IMPRESSION: Color duplex indicates minimal heterogeneous and calcified plaque, with no hemodynamically significant stenosis by duplex criteria in the extracranial cerebrovascular circulation. Signed, Marciano Settles. Rexine Cater, RPVI [facility] Rad[facility] Electronically Signed   By: Myrlene Asper D.O.   On: 03/29/2023 16:54   EEG adult Result Date: 03/29/2023 Arleene Lack, MD     03/29/2023  4:22 PM Patient Name: Scott [name] MRN: [id number] Epilepsy Attending: Arleene Lack Referring Physician/Provider: Montey Apa, DO Date: 03/29/2023 Duration: 31.39 mins Patient history: 82yo m with syncope. EEG to evaluate for seizure Level of alertness: Awake, asleep AEDs during EEG study: None Technical aspects: This EEG study was done with scalp electrodes positioned according to the 10-20 International system of electrode placement. Electrical activity was reviewed with band pass filter of 1-70Hz , sensitivity of 7 uV/mm, display speed of 46mm/sec with a 60Hz  notched filter applied as appropriate. EEG data were recorded continuously and digitally stored.  Video monitoring was available and reviewed as appropriate. Description: The posterior dominant rhythm consists of 8-9 Hz activity of moderate voltage (25-35 uV) seen predominantly in posterior head regions, symmetric and reactive to eye opening and eye closing. Sleep was characterized by vertex waves, sleep spindles (12 to 14 Hz), maximal frontocentral region.  There is an excessive amount of 15 to 18 Hz, 2-3 uV beta activity Hyperventilation and photic stimulation were not performed.   IMPRESSION: This study is within normal limits. No seizures or epileptiform discharges were seen throughout the recording. A normal interictal EEG does not exclude the diagnosis of epilepsy. Priyanka O Yadav   MR BRAIN WO CONTRAST Result Date: 03/28/2023 CLINICAL DATA:  Provided history: Headache, new  onset. Persistent dizziness. EXAM: MRI HEAD WITHOUT CONTRAST TECHNIQUE: Multiplanar, multiecho pulse sequences of the brain and surrounding structures were obtained without intravenous contrast. COMPARISON:  Head CT 03/27/2023. FINDINGS: Intermittently motion degraded examination. Most notably, the axial T2 and axial FLAIR sequences are moderately motion degraded. Within this limitation, findings are as follows. Brain: Mild generalized cerebral atrophy. Multifocal T2 FLAIR hyperintense signal abnormality within the cerebral white matter, nonspecific but compatible with mild chronic small vessel ischemic disease. Punctate chronic microhemorrhage within the left cerebellar hemisphere (series 10, image 14). No cortical encephalomalacia is identified. There is no acute infarct. No evidence of an intracranial mass. No extra-axial fluid collection. No midline shift. Vascular: Maintained flow voids within the proximal large arterial vessels. Skull and upper cervical spine: No focal worrisome marrow lesion. Incompletely assessed cervical spondylosis. At C3-C4, a central disc protrusion contributes  to apparent moderate spinal canal stenosis (with mild flattening of the ventral spinal cord). 3 mm C2-C3 grade 1 anterolisthesis. Sinuses/Orbits: No mass or acute finding within the imaged orbits. Prior bilateral ocular lens replacement. Minimal mucosal thickening scattered within bilateral ethmoid air cells. IMPRESSION: 1. Intermittently motion degraded examination. 2. No evidence of an acute intracranial abnormality. The diffusion-weighted imaging is of good quality and there is no evidence of an acute infarct. 3. Mild chronic small vessel ischemic changes within the cerebral white matter. 4. Mild generalized cerebral atrophy. 5. Minor ethmoid sinus mucosal thickening bilaterally. 6. Incompletely assessed cervical spondylosis. At C3-C4, a central disc protrusion contributes to apparent moderate spinal canal stenosis (with mild  flattening of the ventral spinal cord). Electronically Signed   By: Bascom Lily D.O.   On: 03/28/2023 14:12   ECHOCARDIOGRAM COMPLETE Result Date: 03/28/2023    ECHOCARDIOGRAM REPORT   Patient Name:   Scott [name] Date of Exam: 03/27/2023 Medical Rec #:  [id number]      Height:       70.0 in Accession #:    [id number]     Weight:       176.1 lb Date of Birth:  09/24/1940       BSA:          1.978 m Patient Age:    82 years       BP:           127/63 mmHg Patient Gender: M              HR:           57 bpm. Exam Location:  [facility] Procedure: 2D Echo, Cardiac Doppler and Color Doppler (Both Spectral and Color            Flow Doppler were utilized during procedure). Indications:     R55 Syncope  History:         Patient has prior history of Echocardiogram examinations, most                  recent 03/21/2019. Cardiomyopathy, CAD, Signs/Symptoms:Dyspnea                  and Syncope; Risk Factors:Hypertension and Dyslipidemia.                  Pulmonary Emboli.  Sonographer:     Brigid Canada RDCS Referring Phys:  [id number] AMY N COX Diagnosing Phys: Antonette Batters MD IMPRESSIONS  1. Left ventricular ejection fraction, by estimation, is 40 to 45%. The left ventricle has normal function. The left ventricle demonstrates global hypokinesis. Left ventricular diastolic parameters are consistent with Grade III diastolic dysfunction (restrictive).  2. Right ventricular systolic function is normal. The right ventricular size is normal.  3. Left atrial size was moderately dilated.  4. Right atrial size was moderately dilated.  5. The mitral valve is normal in structure. Mild mitral valve regurgitation.  6. The aortic valve is normal in structure. Aortic valve regurgitation is not visualized. FINDINGS  Left Ventricle: Left ventricular ejection fraction, by estimation, is 40 to 45%. The left ventricle has normal function. The left ventricle demonstrates global hypokinesis. The left ventricular internal cavity size was normal  in size. There is no left ventricular hypertrophy. Left ventricular diastolic parameters are consistent with Grade III diastolic dysfunction (restrictive). Right Ventricle: The right ventricular size is normal. No increase in right ventricular wall thickness. Right ventricular systolic function is normal. Left Atrium: Left atrial size  was moderately dilated. Right Atrium: Right atrial size was moderately dilated. Pericardium: There is no evidence of pericardial effusion. Mitral Valve: The mitral valve is normal in structure. Mild mitral valve regurgitation. Tricuspid Valve: The tricuspid valve is normal in structure. Tricuspid valve regurgitation is mild. Aortic Valve: The aortic valve is normal in structure. Aortic valve regurgitation is not visualized. Pulmonic Valve: The pulmonic valve was normal in structure. Pulmonic valve regurgitation is not visualized. Aorta: The ascending aorta was not well visualized. IAS/Shunts: No atrial level shunt detected by color flow Doppler.  LEFT VENTRICLE PLAX 2D LVIDd:         5.30 cm   Diastology LVIDs:         4.80 cm   LV e' medial:    5.60 cm/s LV PW:         1.10 cm   LV E/e' medial:  16.8 LV IVS:        1.10 cm   LV e' lateral:   10.30 cm/s LVOT diam:     2.20 cm   LV E/e' lateral: 9.1 LV SV:         59 LV SV Index:   30 LVOT Area:     3.80 cm  RIGHT VENTRICLE            IVC RV Basal diam:  4.30 cm    IVC diam: 1.70 cm RV S prime:     9.20 cm/s TAPSE (M-mode): 2.2 cm LEFT ATRIUM           Index        RIGHT ATRIUM           Index LA diam:      4.90 cm 2.48 cm/m   RA Area:     18.50 cm LA Vol (A2C): 23.0 ml 11.63 ml/m  RA Volume:   51.30 ml  25.94 ml/m LA Vol (A4C): 44.5 ml 22.50 ml/m  AORTIC VALVE LVOT Vmax:   62.20 cm/s LVOT Vmean:  41.600 cm/s LVOT VTI:    0.156 m  AORTA Ao Root diam: 3.30 cm Ao Asc diam:  3.90 cm MITRAL VALVE MV Area (PHT): 3.78 cm    SHUNTS MV Decel Time: 201 msec    Systemic VTI:  0.16 m MV E velocity: 93.80 cm/s  Systemic Diam: 2.20 cm MV A  velocity: 47.95 cm/s MV E/A ratio:  1.96 Dwayne D Callwood MD Electronically signed by Antonette Batters MD Signature Date/Time: 03/28/2023/12:41:21 PM    Final    CT HEAD WO CONTRAST ( ) Result Date: 03/28/2023 CLINICAL DATA:  Neuro deficit, acute, stroke suspected Headache, neuro deficit Headache, new onset (Age >= 51y) EXAM: CT HEAD WITHOUT CONTRAST TECHNIQUE: Contiguous axial images were obtained from the base of the skull through the vertex without intravenous contrast. RADIATION DOSE REDUCTION: This exam was performed according to the departmental dose-optimization program which includes automated exposure control, adjustment of the mA and/or kV according to patient size and/or use of iterative reconstruction technique. COMPARISON:  MRI head and CT head Jun 04, 2021. FINDINGS: Brain: No evidence of acute infarction, hemorrhage, hydrocephalus, extra-axial collection or mass lesion/mass effect. Vascular: No hyperdense vessel. Skull: No acute fracture. Sinuses/Orbits: Clear sinuses.  No acute orbital findings. Other: No mastoid effusions. IMPRESSION: No evidence of acute intracranial abnormality. Electronically Signed   By: Stevenson Elbe M.D.   On: 03/28/2023 03:32   Portable Chest 1 View Result Date: 03/27/2023 CLINICAL DATA:  Dizziness with chest pain, initial encounter EXAM: PORTABLE  CHEST 1 VIEW COMPARISON:  12/08/2022 FINDINGS: Postsurgical changes are again noted. Cardiac shadow is mildly prominent but accentuated by the portable technique. Lungs are well aerated bilaterally. No focal infiltrate or effusion is seen. No bony abnormality is noted. Mild interstitial changes are again seen. IMPRESSION: No active disease. Electronically Signed   By: Violeta Grey M.D.   On: 03/27/2023 22:26

## 2023-05-16 NOTE — Assessment & Plan Note (Signed)
 History of PE  Recommend patient to continue Eliquis 2.5mg  BID.   Eliquis prescription is not managed by me.

## 2023-05-17 ENCOUNTER — Ambulatory Visit
Admission: RE | Admit: 2023-05-17 | Discharge: 2023-05-17 | Disposition: A | Discharge status: Home / Self Care | Source: Ambulatory Visit | Attending: Pulmonary Disease | Admitting: Pulmonary Disease

## 2023-05-17 DIAGNOSIS — R042 Hemoptysis: Secondary | ICD-10-CM | POA: Diagnosis present

## 2023-09-24 ENCOUNTER — Other Ambulatory Visit: Payer: Self-pay

## 2023-09-24 ENCOUNTER — Inpatient Hospital Stay
Admission: EM | Admit: 2023-09-24 | Discharge: 2023-09-27 | DRG: 287 | Disposition: A | Discharge status: Home / Self Care | Attending: Internal Medicine | Admitting: Internal Medicine

## 2023-09-24 ENCOUNTER — Emergency Department

## 2023-09-24 ENCOUNTER — Encounter: Payer: Self-pay | Admitting: Intensive Care

## 2023-09-24 ENCOUNTER — Inpatient Hospital Stay

## 2023-09-24 DIAGNOSIS — Z86711 Personal history of pulmonary embolism: Secondary | ICD-10-CM

## 2023-09-24 DIAGNOSIS — Z7901 Long term (current) use of anticoagulants: Secondary | ICD-10-CM

## 2023-09-24 DIAGNOSIS — Z860101 Personal history of adenomatous and serrated colon polyps: Secondary | ICD-10-CM

## 2023-09-24 DIAGNOSIS — Z79899 Other long term (current) drug therapy: Secondary | ICD-10-CM

## 2023-09-24 DIAGNOSIS — I2699 Other pulmonary embolism without acute cor pulmonale: Secondary | ICD-10-CM | POA: Diagnosis present

## 2023-09-24 DIAGNOSIS — I259 Chronic ischemic heart disease, unspecified: Principal | ICD-10-CM

## 2023-09-24 DIAGNOSIS — D519 Vitamin B12 deficiency anemia, unspecified: Secondary | ICD-10-CM | POA: Diagnosis present

## 2023-09-24 DIAGNOSIS — Z7982 Long term (current) use of aspirin: Secondary | ICD-10-CM

## 2023-09-24 DIAGNOSIS — Z955 Presence of coronary angioplasty implant and graft: Secondary | ICD-10-CM

## 2023-09-24 DIAGNOSIS — I25700 Atherosclerosis of coronary artery bypass graft(s), unspecified, with unstable angina pectoris: Principal | ICD-10-CM | POA: Diagnosis present

## 2023-09-24 DIAGNOSIS — Z91041 Radiographic dye allergy status: Secondary | ICD-10-CM

## 2023-09-24 DIAGNOSIS — F419 Anxiety disorder, unspecified: Secondary | ICD-10-CM | POA: Diagnosis present

## 2023-09-24 DIAGNOSIS — Z8249 Family history of ischemic heart disease and other diseases of the circulatory system: Secondary | ICD-10-CM

## 2023-09-24 DIAGNOSIS — I5022 Chronic systolic (congestive) heart failure: Secondary | ICD-10-CM | POA: Diagnosis present

## 2023-09-24 DIAGNOSIS — I200 Unstable angina: Secondary | ICD-10-CM | POA: Diagnosis not present

## 2023-09-24 DIAGNOSIS — I452 Bifascicular block: Secondary | ICD-10-CM | POA: Diagnosis not present

## 2023-09-24 DIAGNOSIS — I110 Hypertensive heart disease with heart failure: Secondary | ICD-10-CM | POA: Diagnosis present

## 2023-09-24 DIAGNOSIS — I255 Ischemic cardiomyopathy: Secondary | ICD-10-CM | POA: Diagnosis present

## 2023-09-24 DIAGNOSIS — E785 Hyperlipidemia, unspecified: Secondary | ICD-10-CM | POA: Diagnosis present

## 2023-09-24 DIAGNOSIS — Z87891 Personal history of nicotine dependence: Secondary | ICD-10-CM

## 2023-09-24 DIAGNOSIS — Z888 Allergy status to other drugs, medicaments and biological substances status: Secondary | ICD-10-CM

## 2023-09-24 DIAGNOSIS — Z885 Allergy status to narcotic agent status: Secondary | ICD-10-CM

## 2023-09-24 DIAGNOSIS — I480 Paroxysmal atrial fibrillation: Secondary | ICD-10-CM | POA: Diagnosis present

## 2023-09-24 DIAGNOSIS — N179 Acute kidney failure, unspecified: Secondary | ICD-10-CM | POA: Diagnosis present

## 2023-09-24 DIAGNOSIS — I252 Old myocardial infarction: Secondary | ICD-10-CM

## 2023-09-24 DIAGNOSIS — I25709 Atherosclerosis of coronary artery bypass graft(s), unspecified, with unspecified angina pectoris: Secondary | ICD-10-CM | POA: Diagnosis present

## 2023-09-24 LAB — COMPREHENSIVE METABOLIC PANEL WITH GFR
ALT: 15 U/L (ref 0–44)
AST: 21 U/L (ref 15–41)
Albumin: 3.1 g/dL — ABNORMAL LOW (ref 3.5–5.0)
Alkaline Phosphatase: 38 U/L (ref 38–126)
Anion gap: 9 (ref 5–15)
BUN: 18 mg/dL (ref 8–23)
CO2: 26 mmol/L (ref 22–32)
Calcium: 8.7 mg/dL — ABNORMAL LOW (ref 8.9–10.3)
Chloride: 103 mmol/L (ref 98–111)
Creatinine, Ser: 1.4 mg/dL — ABNORMAL HIGH (ref 0.61–1.24)
GFR, Estimated: 50 mL/min — ABNORMAL LOW (ref >60)
Glucose, Bld: 106 mg/dL — ABNORMAL HIGH (ref 70–99)
Potassium: 4.4 mmol/L (ref 3.5–5.1)
Sodium: 138 mmol/L (ref 135–145)
Total Bilirubin: 0.6 mg/dL (ref 0.0–1.2)
Total Protein: 6.3 g/dL — ABNORMAL LOW (ref 6.5–8.1)

## 2023-09-24 LAB — CBC WITH DIFFERENTIAL/PLATELET
Abs Immature Granulocytes: 0.01 K/uL (ref 0.00–0.07)
Basophils Absolute: 0 K/uL (ref 0.0–0.1)
Basophils Relative: 1 %
Eosinophils Absolute: 0.1 K/uL (ref 0.0–0.5)
Eosinophils Relative: 1 %
HCT: 44.2 % (ref 39.0–52.0)
Hemoglobin: 14.7 g/dL (ref 13.0–17.0)
Immature Granulocytes: 0 %
Lymphocytes Relative: 35 %
Lymphs Abs: 1.8 K/uL (ref 0.7–4.0)
MCH: 32.4 pg (ref 26.0–34.0)
MCHC: 33.3 g/dL (ref 30.0–36.0)
MCV: 97.4 fL (ref 80.0–100.0)
Monocytes Absolute: 0.5 K/uL (ref 0.1–1.0)
Monocytes Relative: 9 %
Neutro Abs: 2.8 K/uL (ref 1.7–7.7)
Neutrophils Relative %: 54 %
Platelets: 200 K/uL (ref 150–400)
RBC: 4.54 MIL/uL (ref 4.22–5.81)
RDW: 14 % (ref 11.5–15.5)
WBC: 5.3 K/uL (ref 4.0–10.5)
nRBC: 0 % (ref 0.0–0.2)

## 2023-09-24 LAB — PROTIME-INR
INR: 1.2 (ref 0.8–1.2)
Prothrombin Time: 15.4 s — ABNORMAL HIGH (ref 11.4–15.2)

## 2023-09-24 LAB — HEPARIN LEVEL (UNFRACTIONATED): Heparin Unfractionated: >1.1 [IU]/mL — ABNORMAL HIGH (ref 0.30–0.70)

## 2023-09-24 LAB — APTT
aPTT: 23 s — ABNORMAL LOW (ref 24–36)
aPTT: 75 s — ABNORMAL HIGH (ref 24–36)

## 2023-09-24 LAB — TROPONIN I (HIGH SENSITIVITY)
Troponin I (High Sensitivity): 13 ng/L (ref <18)
Troponin I (High Sensitivity): 15 ng/L (ref <18)
Troponin I (High Sensitivity): 14 ng/L (ref <18)
Troponin I (High Sensitivity): 13 ng/L (ref <18)

## 2023-09-24 LAB — BRAIN NATRIURETIC PEPTIDE: B Natriuretic Peptide: 124.6 pg/mL — ABNORMAL HIGH (ref 0.0–100.0)

## 2023-09-24 LAB — D-DIMER, QUANTITATIVE: D-Dimer, Quant: 0.55 ug{FEU}/mL — ABNORMAL HIGH (ref 0.00–0.50)

## 2023-09-24 MED ORDER — DIPHENHYDRAMINE HCL 50 MG/ML IJ SOLN: 50.0000 mg | Freq: Once | INTRAMUSCULAR | Status: AC

## 2023-09-24 MED ORDER — MIDODRINE HCL 5 MG PO TABS: 10.0000 mg | ORAL_TABLET | Freq: Three times a day (TID) | ORAL | Status: DC

## 2023-09-24 MED ORDER — IPRATROPIUM-ALBUTEROL 0.5-2.5 (3) MG/3ML IN SOLN: 3.0000 mL | Freq: Four times a day (QID) | RESPIRATORY_TRACT | Status: DC | PRN

## 2023-09-24 MED ORDER — VITAMIN B-12 1000 MCG PO TABS
1000.0000 ug | ORAL_TABLET | Freq: Every day | ORAL | Status: DC
  Administered 2023-09-25 – 2023-09-27 (×3): 1000 ug via ORAL
  Filled 2023-09-24 (×3): qty 1

## 2023-09-24 MED ORDER — NITROGLYCERIN IN D5W 200-5 MCG/ML-% IV SOLN
0.0000 ug/min | INTRAVENOUS | Status: DC
  Administered 2023-09-24: 5 ug/min via INTRAVENOUS
  Filled 2023-09-24: qty 250

## 2023-09-24 MED ORDER — IOHEXOL 350 MG/ML SOLN
75.0000 mL | Freq: Once | INTRAVENOUS | Status: AC | PRN
  Administered 2023-09-24: 75 mL via INTRAVENOUS

## 2023-09-24 MED ORDER — ISOSORBIDE MONONITRATE ER 60 MG PO TB24: 60.0000 mg | ORAL_TABLET | Freq: Two times a day (BID) | ORAL | Status: DC

## 2023-09-24 MED ORDER — DIPHENHYDRAMINE HCL 25 MG PO CAPS
50.0000 mg | ORAL_CAPSULE | Freq: Once | ORAL | Status: AC
  Administered 2023-09-24: 50 mg via ORAL
  Filled 2023-09-24: qty 2

## 2023-09-24 MED ORDER — HEPARIN SODIUM (PORCINE) 5000 UNIT/ML IJ SOLN
4000.0000 [IU] | Freq: Once | INTRAMUSCULAR | Status: AC
  Administered 2023-09-24: 4000 [IU] via INTRAVENOUS
  Filled 2023-09-24: qty 1

## 2023-09-24 MED ORDER — MORPHINE SULFATE (PF) 2 MG/ML IV SOLN
2.0000 mg | INTRAVENOUS | Status: DC | PRN
  Administered 2023-09-24: 2 mg via INTRAVENOUS
  Filled 2023-09-24: qty 1

## 2023-09-24 MED ORDER — ONDANSETRON HCL 4 MG/2ML IJ SOLN: 4.0000 mg | Freq: Four times a day (QID) | INTRAMUSCULAR | Status: DC | PRN

## 2023-09-24 MED ORDER — ASPIRIN 81 MG PO CHEW
324.0000 mg | CHEWABLE_TABLET | Freq: Once | ORAL | Status: AC
  Administered 2023-09-24: 324 mg via ORAL
  Filled 2023-09-24: qty 4

## 2023-09-24 MED ORDER — ATORVASTATIN CALCIUM 80 MG PO TABS
80.0000 mg | ORAL_TABLET | Freq: Every day | ORAL | Status: DC
  Administered 2023-09-24 – 2023-09-27 (×4): 80 mg via ORAL
  Filled 2023-09-24 (×4): qty 1

## 2023-09-24 MED ORDER — APIXABAN 2.5 MG PO TABS: 2.5000 mg | ORAL_TABLET | Freq: Two times a day (BID) | ORAL | Status: DC

## 2023-09-24 MED ORDER — POLYETHYLENE GLYCOL 3350 17 G PO PACK
17.0000 g | PACK | Freq: Every day | ORAL | Status: DC | PRN
Start: 2023-09-24 — End: 2023-09-27

## 2023-09-24 MED ORDER — AMLODIPINE BESYLATE 10 MG PO TABS
10.0000 mg | ORAL_TABLET | Freq: Every day | ORAL | Status: DC
  Administered 2023-09-24 – 2023-09-26 (×3): 10 mg via ORAL
  Filled 2023-09-24 (×3): qty 1

## 2023-09-24 MED ORDER — ACETAMINOPHEN 650 MG RE SUPP: 650.0000 mg | Freq: Four times a day (QID) | RECTAL | Status: DC | PRN

## 2023-09-24 MED ORDER — ONDANSETRON HCL 4 MG PO TABS: 4.0000 mg | ORAL_TABLET | Freq: Four times a day (QID) | ORAL | Status: DC | PRN

## 2023-09-24 MED ORDER — LORAZEPAM 0.5 MG PO TABS
0.5000 mg | ORAL_TABLET | Freq: Four times a day (QID) | ORAL | Status: DC | PRN
  Administered 2023-09-24: 0.5 mg via ORAL
  Filled 2023-09-24: qty 1

## 2023-09-24 MED ORDER — EZETIMIBE 10 MG PO TABS
10.0000 mg | ORAL_TABLET | Freq: Every day | ORAL | Status: DC
  Administered 2023-09-24 – 2023-09-27 (×4): 10 mg via ORAL
  Filled 2023-09-24 (×4): qty 1

## 2023-09-24 MED ORDER — ACETAMINOPHEN 325 MG PO TABS: 650.0000 mg | ORAL_TABLET | Freq: Four times a day (QID) | ORAL | Status: DC | PRN

## 2023-09-24 MED ORDER — MORPHINE SULFATE (PF) 4 MG/ML IV SOLN
4.0000 mg | Freq: Once | INTRAVENOUS | Status: AC
  Administered 2023-09-24: 4 mg via INTRAVENOUS
  Filled 2023-09-24: qty 1

## 2023-09-24 MED ORDER — LOSARTAN POTASSIUM 25 MG PO TABS
25.0000 mg | ORAL_TABLET | Freq: Every day | ORAL | Status: DC
  Administered 2023-09-24 – 2023-09-27 (×4): 25 mg via ORAL
  Filled 2023-09-24 (×4): qty 1

## 2023-09-24 MED ORDER — RANOLAZINE ER 500 MG PO TB12
1000.0000 mg | ORAL_TABLET | Freq: Two times a day (BID) | ORAL | Status: DC
  Administered 2023-09-24 – 2023-09-27 (×6): 1000 mg via ORAL
  Filled 2023-09-24 (×7): qty 2

## 2023-09-24 MED ORDER — HEPARIN (PORCINE) 25000 UT/250ML-% IV SOLN
950.0000 [IU]/h | INTRAVENOUS | Status: DC
  Administered 2023-09-24: 950 [IU]/h via INTRAVENOUS
  Filled 2023-09-24: qty 250

## 2023-09-24 MED ORDER — METHYLPREDNISOLONE SODIUM SUCC 40 MG IJ SOLR
40.0000 mg | Freq: Once | INTRAMUSCULAR | Status: AC
  Administered 2023-09-24: 40 mg via INTRAVENOUS
  Filled 2023-09-24: qty 1

## 2023-09-24 NOTE — Progress Notes (Signed)
 Still has a lot of chest pain on maximum dose of nitro.  Blood pressure is elevated thus will add Norvasc  10 mg and losartan  25 mg once a day.  In the ER CTA chest was not done even though has a history of PE in the past with the D-dimer being borderline advised CTA chest which is being ordered now.  Troponin are not elevated EKG has no ST elevation there is advised anxiolytics and morphine  to control chest pain.  Continue heparin  and IV nitro.  Left heart catheterization is scheduled first thing in the morning at 730.

## 2023-09-24 NOTE — ED Triage Notes (Signed)
 Patient c/o achy chest pain that started yesterday. Patient gets dizzy when attempting to stand and becomes unsteady on feet.   A&O x4 in triage

## 2023-09-24 NOTE — Progress Notes (Signed)
 PHARMACY - ANTICOAGULATION CONSULT NOTE  Pharmacy Consult for Heparin  Indication: chest pain/ACS  Allergies  Allergen Reactions   Contrast Media [Iodinated Contrast Media] Hives   Metrizamide Hives   Oxycodone Hcl Itching   Vicodin [Hydrocodone-Acetaminophen ] Itching    Patient Measurements: Height: 5' 10 (177.8 cm) Weight: 79.8 kg (176 lb) IBW/kg (Calculated) : 73 HEPARIN  DW (KG): 79.8  Vital Signs: Temp: 98.1 F (36.7 C) (09/01 1945) Temp Source: Oral (09/01 1945) BP: 135/79 (09/01 1845) Pulse Rate: 55 (09/01 1530)  Labs: Recent Labs    09/24/23 0950 09/24/23 1249 09/24/23 1450 09/24/23 1637 09/24/23 1843  HGB 14.7  --   --   --   --   HCT 44.2  --   --   --   --   PLT 200  --   --   --   --   APTT 23*  --   --   --  75*  LABPROT 15.4*  --   --   --   --   INR 1.2  --   --   --   --   HEPARINUNFRC >1.10*  --   --   --   --   CREATININE 1.40*  --   --   --   --   TROPONINIHS 13 15 14 13   --     Estimated Creatinine Clearance: 42.7 mL/min (A) (by C-G formula based on SCr of 1.4 mg/dL (H)).   Medical History: Past Medical History:  Diagnosis Date   Arthritis    right hip, since a fall   CAD (coronary artery disease)    a. 04/2012 Cath: 3VD->Med Rx;  b. 04/2013 PCI RCA (2 DES); c. 03/2014 PCI: LAD 80 (3.0x23 Xience [place]pine DES); d. 06/2014 CABG x 2 ([facility]) LIMA->LAD, VG->OM; e. 12/2014 Cath ([facility]): patent grafts->Med Rx; f. 08/2015 Cath: patent grafts; g. 11/2016 Cath: LM min irregs, LAD 20p, 30m, LCX 100p/m, RCA 20ost, 10p/m/d, VG->OM1 nl, LIMA->dLAD nl; f. 07/2017 MV: No isch, EF 51%.   Cardiomyopathy, ischemic    a. 04/2012 Echo: EF 40-45%;  b. 08/2013 Echo: EF 45-50%; c. 01/2015 Echo: EF 40-45%; d. 07/2016 Echo: EF 60-65%; e. 09/2017 Echo: EF 55-60%, Gr1 DD.   Carotid arterial disease (HCC)    a. 05/2013 Carotid U/S; bilat 40-50% ICA stenosis.   Chronic Chest Pain    USES NITRO   Chronic systolic CHF (congestive heart failure) (HCC)    a. 01/2015 Echo: EF 40-45%; b.  07/2016 Echo: EF 60-65%; c. 09/2017 Echo: EF 55-60%, Gr1 DD, nl RV fxn.   Cough    Diverticulosis    Dizziness    Dyspnea    WITH EXERTION   Headache 1970's   migraine   History of blood transfusion 2016   post op   Hyperlipidemia    Hypertension    Iron  deficiency anemia due to chronic blood loss 09/11/2016   Myocardial infarction [facility])    unsure of when   Pulmonary emboli (HCC) 07/2016   a. On Xarelto .   Reflux esophagitis    Sternal pain    a. 03/2016 s/p Sternal wire removal; b. 05/2016 s/p redo median sternotomy for sternal debridement and sternal plating.   Syncope 04/2018   CARDIOLOGIST WAS DR PERLA AND HE WAS AWARE-NOW PT SEES PARASCHOS   Tubular adenoma of colon     Medications:  Scheduled:   amLODipine   10 mg Oral Daily   atorvastatin   80 mg Oral Daily   [START ON  09/25/2023] cyanocobalamin   1,000 mcg Oral Daily   ezetimibe   10 mg Oral Daily   losartan   25 mg Oral Daily   ranolazine   1,000 mg Oral BID   Infusions:   heparin  950 Units/hr (09/24/23 1807)   nitroGLYCERIN  20 mcg/min (09/24/23 1855)    Assessment: 83 yo M presenting with chest pain. Patient on Eliquis  PTA for atrial fibrillation. EKG not consistent with acute STEMI per MD note. Last PTA Eliquis  dose 9/1 AM. Pharmacy consulted to dose heparin  for ACS.   9/1 1843: aPTT 75, therapeutic x1  Goal of Therapy:  Heparin  level 0.3-0.7 units/ml aPTT 66-102 seconds Monitor platelets by anticoagulation protocol: Yes   Plan:  Continue heparin  infusion at 950 units/hr. Check ~8 hr aPTT. Will use aPTT to monitor heparin . Switch to heparin  level monitoring once aPTT and heparin  level correlate.  Daily CBC and monitor for signs/symptoms of bleeding.  Alan CHRISTELLA Hoe 09/24/2023,7:46 PM

## 2023-09-24 NOTE — Consult Note (Signed)
 COSTA JHA is a 83 y.o. male  [id number]  Primary Cardiologist: [facility] cardiology Reason for Consultation: Unstable angina  HPI: This is a 83 year old white male with a past medical history of CABG, multiple PCI's and atrial fibrillation presented to the hospital with chest pain.  Patient states that his chest pain started yesterday and had to take nitro 5 times and this morning got worse thus came to the emergency room.  He still having pressure in his chest associated with shortness of breath and lightheadedness.   Review of Systems: No orthopnea PND or leg swelling   Past Medical History:  Diagnosis Date   Arthritis    right hip, since a fall   CAD (coronary artery disease)    a. 04/2012 Cath: 3VD->Med Rx;  b. 04/2013 PCI RCA (2 DES); c. 03/2014 PCI: LAD 80 (3.0x23 Xience Alpine DES); d. 06/2014 CABG x 2 ([facility]) LIMA->LAD, VG->OM; e. 12/2014 Cath ([facility]): patent grafts->Med Rx; f. 08/2015 Cath: patent grafts; g. 11/2016 Cath: LM min irregs, LAD 20p, 26m, LCX 100p/m, RCA 20ost, 10p/m/d, VG->OM1 nl, LIMA->dLAD nl; f. 07/2017 MV: No isch, EF 51%.   Cardiomyopathy, ischemic    a. 04/2012 Echo: EF 40-45%;  b. 08/2013 Echo: EF 45-50%; c. 01/2015 Echo: EF 40-45%; d. 07/2016 Echo: EF 60-65%; e. 09/2017 Echo: EF 55-60%, Gr1 DD.   Carotid arterial disease (HCC)    a. 05/2013 Carotid U/S; bilat 40-50% ICA stenosis.   Chronic Chest Pain    USES NITRO   Chronic systolic CHF (congestive heart failure) (HCC)    a. 01/2015 Echo: EF 40-45%; b. 07/2016 Echo: EF 60-65%; c. 09/2017 Echo: EF 55-60%, Gr1 DD, nl RV fxn.   Cough    Diverticulosis    Dizziness    Dyspnea    WITH EXERTION   Headache 1970's   migraine   History of blood transfusion 2016   post op   Hyperlipidemia    Hypertension    Iron  deficiency anemia due to chronic blood loss 09/11/2016   Myocardial infarction [facility])    unsure of when   Pulmonary emboli (HCC) 07/2016   a. On Xarelto .   Reflux esophagitis    Sternal pain    a. 03/2016 s/p  Sternal wire removal; b. 05/2016 s/p redo median sternotomy for sternal debridement and sternal plating.   Syncope 04/2018   CARDIOLOGIST WAS DR PERLA AND HE WAS AWARE-NOW PT SEES PARASCHOS   Tubular adenoma of colon     (Not in a hospital admission)      Infusions:  heparin      nitroGLYCERIN  5 mcg/min (09/24/23 1041)    Allergies  Allergen Reactions   Contrast Media [Iodinated Contrast Media] Hives   Metrizamide Hives   Oxycodone Hcl Itching   Vicodin [Hydrocodone-Acetaminophen ] Itching    Social History   Socioeconomic History   Marital status: Married    Spouse name: Not on file   Number of children: Not on file   Years of education: Not on file   Highest education level: Not on file  Occupational History   Not on file  Tobacco Use   Smoking status: Former    Current packs/day: 0.00    Average packs/day: 1 pack/day for 35.0 years (35.0 ttl pk-yrs)    Types: Cigarettes    Start date: 54    Quit date: 2009    Years since quitting: 16.6   Smokeless tobacco: Never  Vaping Use   Vaping status: Never Used  Substance and  Sexual Activity   Alcohol use: No   Drug use: No   Sexual activity: Yes  Other Topics Concern   Not on file  Social History Narrative   Not on file   Social Drivers of Health   Financial Resource Strain: Low Risk  (08/14/2023)   Received from [facility]   Overall Financial Resource Strain (CARDIA)    Difficulty of Paying Living Expenses: Not hard at all  Food Insecurity: No Food Insecurity (08/14/2023)   Received from [facility]   Hunger Vital Sign    Within the past 12 months, you worried that your food would run out before you got the money to buy more.: Never true    Within the past 12 months, the food you bought just didn't last and you didn't have money to get more.: Never true  Transportation Needs: No Transportation Needs (08/14/2023)   Received from [facility] -  Transportation    In the past 12 months, has lack of transportation kept you from medical appointments or from getting medications?: No    Lack of Transportation (Non-Medical): No  Physical Activity: Not on file  Stress: Not on file  Social Connections: Moderately Integrated (04/04/2023)   Social Connection and Isolation Panel    Frequency of Communication with Friends and Family: Three times a week    Frequency of Social Gatherings with Friends and Family: More than three times a week    Attends Religious Services: More than 4 times per year    Active Member of [employer] or Organizations: No    Attends Banker Meetings: Patient declined    Marital Status: Married  Catering manager Violence: Not At Risk (03/27/2023)   Humiliation, Afraid, Rape, and Kick questionnaire    Fear of Current or Ex-Partner: No    Emotionally Abused: No    Physically Abused: No    Sexually Abused: No    Family History  Problem Relation Age of Onset   Heart attack Father 48       MI   Cancer Maternal Uncle     PHYSICAL EXAM: Vitals:   09/24/23 0922  BP: 138/71  Pulse: (!) 57  Resp: 18  Temp: 98.1 F (36.7 C)  SpO2: 96%    No intake or output data in the 24 hours ending 09/24/23 1129  General:  Well appearing. No respiratory difficulty HEENT: normal Neck: supple. no JVD. Carotids 2+ bilat; no bruits. No lymphadenopathy or thryomegaly appreciated. Cor: PMI nondisplaced. Regular rate & rhythm. No rubs, gallops or murmurs. Lungs: clear Abdomen: soft, nontender, nondistended. No hepatosplenomegaly. No bruits or masses. Good bowel sounds. Extremities: no cyanosis, clubbing, rash, edema Neuro: alert & oriented x 3, cranial nerves grossly intact. moves all 4 extremities w/o difficulty. Affect pleasant.  ECG: Sinus bradycardia 54 bpm with first-degree AV block and right bundle branch block, no R wave progression and old inferior wall MI.  Right bundle branch block is new compared to his March  2025 EKG.  Results for orders placed or performed during the hospital encounter of 09/24/23 (from the past 24 hours)  CBC with Differential     Status: None   Collection Time: 09/24/23  9:50 AM  Result Value Ref Range   WBC 5.3 4.0 - 10.5 K/uL   RBC 4.54 4.22 - 5.81 MIL/uL   Hemoglobin 14.7 13.0 - 17.0 g/dL   HCT 55.7 60.9 - 47.9 %   MCV 97.4 80.0 -  100.0 fL   MCH 32.4 26.0 - 34.0 pg   MCHC 33.3 30.0 - 36.0 g/dL   RDW 85.9 88.4 - 84.4 %   Platelets 200 150 - 400 K/uL   nRBC 0.0 0.0 - 0.2 %   Neutrophils Relative % 54 %   Neutro Abs 2.8 1.7 - 7.7 K/uL   Lymphocytes Relative 35 %   Lymphs Abs 1.8 0.7 - 4.0 K/uL   Monocytes Relative 9 %   Monocytes Absolute 0.5 0.1 - 1.0 K/uL   Eosinophils Relative 1 %   Eosinophils Absolute 0.1 0.0 - 0.5 K/uL   Basophils Relative 1 %   Basophils Absolute 0.0 0.0 - 0.1 K/uL   Immature Granulocytes 0 %   Abs Immature Granulocytes 0.01 0.00 - 0.07 K/uL  Troponin I (High Sensitivity)     Status: None   Collection Time: 09/24/23  9:50 AM  Result Value Ref Range   Troponin I (High Sensitivity) 13 <18 ng/L  Brain natriuretic peptide     Status: Abnormal   Collection Time: 09/24/23  9:50 AM  Result Value Ref Range   B Natriuretic Peptide 124.6 (H) 0.0 - 100.0 pg/mL  D-dimer, quantitative     Status: Abnormal   Collection Time: 09/24/23  9:50 AM  Result Value Ref Range   D-Dimer, Quant 0.55 (H) 0.00 - 0.50 ug/mL-FEU  APTT     Status: Abnormal   Collection Time: 09/24/23  9:50 AM  Result Value Ref Range   aPTT 23 (L) 24 - 36 seconds  Comprehensive metabolic panel with GFR     Status: Abnormal   Collection Time: 09/24/23  9:50 AM  Result Value Ref Range   Sodium 138 135 - 145 mmol/L   Potassium 4.4 3.5 - 5.1 mmol/L   Chloride 103 98 - 111 mmol/L   CO2 26 22 - 32 mmol/L   Glucose, Bld 106 (H) 70 - 99 mg/dL   BUN 18 8 - 23 mg/dL   Creatinine, Ser 8.59 (H) 0.61 - 1.24 mg/dL   Calcium  8.7 (L) 8.9 - 10.3 mg/dL   Total Protein 6.3 (L) 6.5 - 8.1  g/dL   Albumin  3.1 (L) 3.5 - 5.0 g/dL   AST 21 15 - 41 U/L   ALT 15 0 - 44 U/L   Alkaline Phosphatase 38 38 - 126 U/L   Total Bilirubin 0.6 0.0 - 1.2 mg/dL   GFR, Estimated 50 (L) >60 mL/min   Anion gap 9 5 - 15  Heparin  level (unfractionated)     Status: Abnormal   Collection Time: 09/24/23  9:50 AM  Result Value Ref Range   Heparin  Unfractionated >1.10 (H) 0.30 - 0.70 IU/mL  Protime-INR     Status: Abnormal   Collection Time: 09/24/23  9:50 AM  Result Value Ref Range   Prothrombin Time 15.4 (H) 11.4 - 15.2 seconds   INR 1.2 0.8 - 1.2   DG Chest 1 View Result Date: 09/24/2023 CLINICAL DATA:  Chest pain EXAM: CHEST  1 VIEW COMPARISON:  04/03/2023 FINDINGS: Low volume film. Cardiopericardial silhouette is at upper limits of normal for size. There is pulmonary vascular congestion without overt pulmonary edema. No pneumothorax or pleural effusion. Telemetry leads overlie the chest. IMPRESSION: Low volume film with pulmonary vascular congestion. Electronically Signed   By: Camellia Candle M.D.   On: 09/24/2023 09:44     ASSESSMENT AND PLAN: #1 unstable angina with EKG showing new right bundle branch block and troponin only 13.  D-dimer is  0.55 with slightly elevated.  Patient continues to have chest pain thus IV heparin  and IV nitro was started.  He took his Eliquis  2.5 mg this morning.  Explained risk and benefit to the patient and set up for cardiac catheterization 7:30 in the morning.  Patient has agreed to the procedure. #2 atrial fibrillation, currently in sinus rhythm and is taking Eliquis  2.5 twice a day as his GFR is 50. #3 patient has history of pulmonary, embolism, D-dimer is borderline and more likely symptoms are related to acute coronary syndrome. Orchid Glassberg Fernand

## 2023-09-24 NOTE — Plan of Care (Signed)
 Patient is now at max Nitro rate and still reports 9-10 pain (unreleaved. BP also climbing 156/107. HR 56 QTC 471.  Hosp and Cardio informed.

## 2023-09-24 NOTE — ED Provider Notes (Signed)
 [facility] Provider Note    Event Date/Time   First MD Initiated Contact with Patient 09/24/23 [phone]     (approximate)   History   Chest Pain   HPI  Scott [name] is a 83 y.o. male with a strong history of CAD status post CABG and multiple stents, history of posthospital atrial fibrillation on Eliquis , history of PE, history of prior tobacco use who presents with 24 hours of progressively worsening chest pain.  Pain is crushing in the middle of his chest.  It has been ongoing since yesterday.  He has been taking sublingual nitro every hour for the pain and has gotten minimal relief.  Denies any fevers or chills, cough congestion or abdominal pain.  Patient's wife at bedside who contributes to the history       Physical Exam   Triage Vital Signs: ED Triage Vitals  Encounter Vitals Group     BP 09/24/23 0922 138/71     Girls Systolic BP Percentile --      Girls Diastolic BP Percentile --      Boys Systolic BP Percentile --      Boys Diastolic BP Percentile --      Pulse Rate 09/24/23 0922 (!) 57     Resp 09/24/23 0922 18     Temp 09/24/23 0922 98.1 F (36.7 C)     Temp Source 09/24/23 0922 Axillary     SpO2 09/24/23 0922 96 %     Weight 09/24/23 0923 176 lb (79.8 kg)     Height 09/24/23 0923 5' 10 (1.778 m)     Head Circumference --      Peak Flow --      Pain Score 09/24/23 0922 3     Pain Loc --      Pain Education --      Exclude from Growth Chart --     Most recent vital signs: Vitals:   09/24/23 1528 09/24/23 1530  BP: (!) 153/81 (!) 144/83  Pulse:  (!) 55  Resp:  15  Temp:  97.8 F (36.6 C)  SpO2:      Nursing Triage Note reviewed. Vital signs reviewed and patients oxygen  saturation is normoxic  General: Patient is well nourished, well developed, awake and alert, appears unwell, holding chest, Head: Normocephalic and atraumatic Eyes: Normal inspection, extraocular muscles intact, no conjunctival pallor Ear, nose, throat:  Normal external exam Neck: Normal range of motion Respiratory: Patient is in no respiratory distress, lungs CTAB Cardiovascular: Patient is not tachycardic, RR GI: Abd SNT with no guarding or rebound  Back: Normal inspection of the back with good strength and range of motion throughout all ext Extremities: pulses intact with good cap refills, no LE pitting edema or calf tenderness Neuro: The patient is alert and oriented to person, place, and time, appropriately conversive, with 5/5 bilat UE/LE strength, no gross motor or sensory defects noted. Coordination appears to be adequate. Skin: Warm, dry, and intact Psych: normal mood and affect, no SI or HI  ED Results / Procedures / Treatments   Labs (all labs ordered are listed, but only abnormal results are displayed) Labs Reviewed  BRAIN NATRIURETIC PEPTIDE - Abnormal; Notable for the following components:      Result Value   B Natriuretic Peptide 124.6 (*)    All other components within normal limits  D-DIMER, QUANTITATIVE - Abnormal; Notable for the following components:   D-Dimer, Quant 0.55 (*)    All other components  within normal limits  APTT - Abnormal; Notable for the following components:   aPTT 23 (*)    All other components within normal limits  COMPREHENSIVE METABOLIC PANEL WITH GFR - Abnormal; Notable for the following components:   Glucose, Bld 106 (*)    Creatinine, Ser 1.40 (*)    Calcium  8.7 (*)    Total Protein 6.3 (*)    Albumin  3.1 (*)    GFR, Estimated 50 (*)    All other components within normal limits  HEPARIN  LEVEL (UNFRACTIONATED) - Abnormal; Notable for the following components:   Heparin  Unfractionated >1.10 (*)    All other components within normal limits  PROTIME-INR - Abnormal; Notable for the following components:   Prothrombin Time 15.4 (*)    All other components within normal limits  CBC WITH DIFFERENTIAL/PLATELET  APTT  TROPONIN I (HIGH SENSITIVITY)  TROPONIN I (HIGH SENSITIVITY)  TROPONIN I  (HIGH SENSITIVITY)  TROPONIN I (HIGH SENSITIVITY)     EKG EKG and rhythm strip are interpreted by myself: 9:24  EKG: [Normal sinus rhythm] at heart rate of 54, wide QRS duration, QTc 462, nonspecific ST segments and T waves no ectopy EKG not consistent with Acute STEMI Rhythm strip: NSR Wide QRS  in lead II Patient EKG more wide than EKG done in March   EKG and rhythm strip are interpreted by myself: 9:30  EKG: [Normal sinus rhythm] at heart rate of 53, wide QRS duration, QTc 473, nonspecific ST segments and T waves no ectopy EKG not consistent with Acute STEMI Rhythm strip: wide QRS in lead II Rhythm strip:    RADIOLOGY Xray chest: Slight pleural edema and radiologist reads this as pulmonary vascular congestion on my independent review interpretation    PROCEDURES:  Critical Care performed: Yes, see critical care procedure note(s)  .Critical Care  Performed by: Nicholaus Rolland BRAVO, MD Authorized by: Nicholaus Rolland BRAVO, MD   Critical care provider statement:    Critical care time (minutes):  30   Critical care was time spent personally by me on the following activities:  Development of treatment plan with patient or surrogate, discussions with consultants, evaluation of patient's response to treatment, examination of patient, ordering and review of laboratory studies, ordering and review of radiographic studies, ordering and performing treatments and interventions, pulse oximetry, re-evaluation of patient's condition and review of old charts   Care discussed with: admitting provider   Comments:     Unstable angina versus NSTEMI requiring use of nitro drip and heparin  drip and cardiology consultation    MEDICATIONS ORDERED IN ED: Medications  nitroGLYCERIN  50 mg in dextrose  5 % 250 mL (0.2 mg/mL) infusion (40 mcg/min Intravenous Rate/Dose Change 09/24/23 1417)  heparin  ADULT infusion 100 units/mL (25000 units/250mL) (950 Units/hr Intravenous New Bag/Given 09/24/23 1241)   acetaminophen  (TYLENOL ) tablet 650 mg (has no administration in time range)    Or  acetaminophen  (TYLENOL ) suppository 650 mg (has no administration in time range)  polyethylene glycol (MIRALAX  / GLYCOLAX ) packet 17 g (has no administration in time range)  ondansetron  (ZOFRAN ) tablet 4 mg (has no administration in time range)    Or  ondansetron  (ZOFRAN ) injection 4 mg (has no administration in time range)  atorvastatin  (LIPITOR ) tablet 80 mg (80 mg Oral Given 09/24/23 1347)  ezetimibe  (ZETIA ) tablet 10 mg (10 mg Oral Given 09/24/23 1347)  cyanocobalamin  (VITAMIN B12) tablet 1,000 mcg (has no administration in time range)  ipratropium-albuterol  (DUONEB) 0.5-2.5 (3) MG/3ML nebulizer solution 3 mL (has no administration in  time range)  ranolazine  (RANEXA ) 12 hr tablet 1,000 mg (has no administration in time range)  morphine  (PF) 2 MG/ML injection 2 mg (2 mg Intravenous Given 09/24/23 1346)  diphenhydrAMINE  (BENADRYL ) capsule 50 mg (has no administration in time range)    Or  diphenhydrAMINE  (BENADRYL ) injection 50 mg (has no administration in time range)  amLODipine  (NORVASC ) tablet 10 mg (10 mg Oral Given 09/24/23 1406)  losartan  (COZAAR ) tablet 25 mg (25 mg Oral Given 09/24/23 1406)  LORazepam  (ATIVAN ) tablet 0.5 mg (0.5 mg Oral Given 09/24/23 1406)  aspirin  chewable tablet 324 mg (324 mg Oral Given 09/24/23 0959)  morphine  (PF) 4 MG/ML injection 4 mg (4 mg Intravenous Given 09/24/23 1002)  heparin  injection 4,000 Units (4,000 Units Intravenous Given 09/24/23 1000)  methylPREDNISolone  sodium succinate (SOLU-MEDROL ) 40 mg/mL injection 40 mg (40 mg Intravenous Given 09/24/23 1347)     IMPRESSION / MDM / ASSESSMENT AND PLAN / ED COURSE                                Differential diagnosis includes, but is not limited to, ACS, PE, electrolyte derangement, anemia, musculoskeletal, pneumonia   ED course: Patient presents acutely and appears unwell.  He is grabbing his chest and given his complicated heart  history I am concerned about ACS.  Initial EKG demonstrated a wide QRS which is different from prior EKGs.  Consequently on-call cardiologist consulted and did come and evaluate the patient.  He wants the patient to be initiated on a heparin  drip and a nitro drip.  There is a plan for cardiac cath tomorrow at 7:30 AM.  Patient's D-dimer was slightly elevated however this does age adjust. Luckily, initial troponin is not elevated.  Will plan for admission today and will discuss case with hospitalist   Clinical Course as of 09/24/23 1554  Mon Sep 24, 2023  9066 Patient appears poorly.  With ongoing pain and EKG does appear significantly different than 311 and given his extensive history we will consult congenital cardiology right away [HD]  [phone] Case discussed with [facility] cardiologst Dr. Fernand. He is pulling up to the ED now, will see the patient.  [HD]  [id number] Dr. Fernand at bedside.  Request nitroglycerin  drip and heparin  drip which has been ordered [HD]  1044 Case discussed with hospitalist for admission [HD]    Clinical Course User Index [HD] Nicholaus Rolland BRAVO, MD     FINAL CLINICAL IMPRESSION(S) / ED DIAGNOSES   Final diagnoses:  Chest pain due to myocardial ischemia, unspecified ischemic chest pain type  Unstable angina (HCC)     Rx / DC Orders   ED Discharge Orders     None        Note:  This document was prepared using Dragon voice recognition software and may include unintentional dictation errors.   Nicholaus Rolland BRAVO, MD 09/24/23 [phone]

## 2023-09-24 NOTE — H&P (Signed)
 History and Physical    Scott [name] FMW:[id number] DOB: 1940/05/08 DOA: 09/24/2023  DOS: the patient was seen and examined on 09/24/2023  PCP: Rudolpho Norleen BIRCH, MD   Patient coming from: Home  I have personally briefly reviewed patient's old medical records in [facility]  Chief Complaint: Chest pain  HPI: Scott [name] is a pleasant 83 y.o. male with medical history significant for CAD s/p CABG and multiple PCI's, atrial fibrillation on Eliquis , HLD, HTN, history of pulmonary embolism who came into the hospital with ongoing chest pain.  Patient stated that chest pain started yesterday which was 8/10 in intensity, pressure-like, constant, did not get better with nitroglycerin  and aspirin  at home.  He chest pain was associated with some shortness of breath and lightheadedness but no radiation.  He took 5 nitro at home and the pain rather got worse and decided come to the emergency room.  He denies any fever, chills, cough, palpitation, dysuria.  ED Course: Upon arrival to the ED, patient is found to be in severe pain despite nitroglycerin  and pain medications.  His workup was reassuring but patient remained constant and EKG showed some widening of the QRS looking like a new LBBB.  There was a concern for unstable angina.  He was started on nitro glycerin drip and heparin  drip per cardiology recommendation.  Hospitalist service was consulted for evaluation for admission for possible cardiac catheterization tomorrow morning.  Review of Systems:  ROS  All other systems negative except as noted in the HPI.  Past Medical History:  Diagnosis Date   Arthritis    right hip, since a fall   CAD (coronary artery disease)    a. 04/2012 Cath: 3VD->Med Rx;  b. 04/2013 PCI RCA (2 DES); c. 03/2014 PCI: LAD 80 (3.0x23 Xience Alpine DES); d. 06/2014 CABG x 2 ([facility]) LIMA->LAD, VG->OM; e. 12/2014 Cath ([facility]): patent grafts->Med Rx; f. 08/2015 Cath: patent grafts; g. 11/2016 Cath: LM min irregs, LAD 20p, 69m,  LCX 100p/m, RCA 20ost, 10p/m/d, VG->OM1 nl, LIMA->dLAD nl; f. 07/2017 MV: No isch, EF 51%.   Cardiomyopathy, ischemic    a. 04/2012 Echo: EF 40-45%;  b. 08/2013 Echo: EF 45-50%; c. 01/2015 Echo: EF 40-45%; d. 07/2016 Echo: EF 60-65%; e. 09/2017 Echo: EF 55-60%, Gr1 DD.   Carotid arterial disease (HCC)    a. 05/2013 Carotid U/S; bilat 40-50% ICA stenosis.   Chronic Chest Pain    USES NITRO   Chronic systolic CHF (congestive heart failure) (HCC)    a. 01/2015 Echo: EF 40-45%; b. 07/2016 Echo: EF 60-65%; c. 09/2017 Echo: EF 55-60%, Gr1 DD, nl RV fxn.   Cough    Diverticulosis    Dizziness    Dyspnea    WITH EXERTION   Headache 1970's   migraine   History of blood transfusion 2016   post op   Hyperlipidemia    Hypertension    Iron  deficiency anemia due to chronic blood loss 09/11/2016   Myocardial infarction [facility])    unsure of when   Pulmonary emboli (HCC) 07/2016   a. On Xarelto .   Reflux esophagitis    Sternal pain    a. 03/2016 s/p Sternal wire removal; b. 05/2016 s/p redo median sternotomy for sternal debridement and sternal plating.   Syncope 04/2018   CARDIOLOGIST WAS DR PERLA AND HE WAS AWARE-NOW PT SEES PARASCHOS   Tubular adenoma of colon     Past Surgical History:  Procedure Laterality Date   CARDIAC CATHETERIZATION  05/01/2012  CARDIAC CATHETERIZATION  04/2013   [facility];x3 stent   CARDIAC CATHETERIZATION  01/11/15    [facility]   CARDIAC CATHETERIZATION N/A 09/16/2015   Procedure: LEFT HEART CATH AND CORS/GRAFTS ANGIOGRAPHY;  Surgeon: Evalene JINNY Lunger, MD;  Location: [facility] INVASIVE CV LAB;  Service: Cardiovascular;  Laterality: N/A;   CARPAL TUNNEL RELEASE     right hand   CATARACT EXTRACTION     LEFT   CATARACT EXTRACTION W/PHACO Left 09/16/2014   Procedure: CATARACT EXTRACTION PHACO AND INTRAOCULAR LENS PLACEMENT (IOC);  Surgeon: Dene Etienne, MD;  Location: [facility];  Service: Ophthalmology;  Laterality: Left;   COLONOSCOPY     COLONOSCOPY WITH PROPOFOL  N/A 01/28/2016    Procedure: COLONOSCOPY WITH PROPOFOL ;  Surgeon: Gladis RAYMOND Mariner, MD;  Location: [facility] ENDOSCOPY;  Service: Endoscopy;  Laterality: N/A;   COLONOSCOPY WITH PROPOFOL  N/A 08/23/2021   Procedure: COLONOSCOPY WITH PROPOFOL ;  Surgeon: Unk Corinn Skiff, MD;  Location: [facility] ENDOSCOPY;  Service: Gastroenterology;  Laterality: N/A;  REQUESTS 9AM OR LATER   CORONARY ANGIOPLASTY WITH STENT PLACEMENT  04/13/2014   CORONARY ARTERY BYPASS GRAFT  06-26-14   x3 bypasses   CORONARY PRESSURE/FFR WITH 3D MAPPING N/A 12/10/2018   Procedure: KEN PRESSURE WIRE/FFR STUDY;  Surgeon: Ammon Blunt, MD;  Location: [facility] INVASIVE CV LAB;  Service: Cardiovascular;  Laterality: N/A;   CORONARY ULTRASOUND/IVUS N/A 12/10/2018   Procedure: Intravascular Ultrasound/IVUS;  Surgeon: Ammon Blunt, MD;  Location: [facility] INVASIVE CV LAB;  Service: Cardiovascular;  Laterality: N/A;   CYSTOSCOPY N/A 08/14/2018   Procedure: CYSTOSCOPY;  Surgeon: Twylla Glendia BROCKS, MD;  Location: [facility] ORS;  Service: Urology;  Laterality: N/A;   DE QUERVAIN'S RELEASE Left 08/22/2012   ESOPHAGOGASTRODUODENOSCOPY (EGD) WITH PROPOFOL  N/A 04/26/2015   Procedure: ESOPHAGOGASTRODUODENOSCOPY (EGD) WITH PROPOFOL ;  Surgeon: Deward CINDERELLA Piedmont, MD;  Location: [facility] ENDOSCOPY;  Service: Gastroenterology;  Laterality: N/A;   ESOPHAGOGASTRODUODENOSCOPY (EGD) WITH PROPOFOL  N/A 05/29/2017   Procedure: ESOPHAGOGASTRODUODENOSCOPY (EGD) WITH PROPOFOL ;  Surgeon: Mariner Gladis RAYMOND, MD;  Location: [facility] ENDOSCOPY;  Service: Endoscopy;  Laterality: N/A;   LEFT HEART CATH AND CORONARY ANGIOGRAPHY N/A 12/04/2016   Procedure: LEFT HEART CATH AND CORS/GRAFTS  ANGIOGRAPHY;  Surgeon: Darron Deatrice LABOR, MD;  Location: [facility] INVASIVE CV LAB;  Service: Cardiovascular;  Laterality: N/A;   LEFT HEART CATH AND CORS/GRAFTS ANGIOGRAPHY N/A 04/09/2018   Procedure: LEFT HEART CATH AND CORS/GRAFTS ANGIOGRAPHY;  Surgeon: Ammon Blunt, MD;  Location: [facility] INVASIVE CV LAB;  Service:  Cardiovascular;  Laterality: N/A;   LEFT HEART CATH AND CORS/GRAFTS ANGIOGRAPHY N/A 12/10/2018   Procedure: LEFT HEART CATH AND CORS/GRAFTS ANGIOGRAPHY;  Surgeon: Ammon Blunt, MD;  Location: [facility] INVASIVE CV LAB;  Service: Cardiovascular;  Laterality: N/A;   LEFT HEART CATH AND CORS/GRAFTS ANGIOGRAPHY Left 05/25/2020   Procedure: LEFT HEART CATH AND CORS/GRAFTS ANGIOGRAPHY;  Surgeon: Ammon Blunt, MD;  Location: [facility] INVASIVE CV LAB;  Service: Cardiovascular;  Laterality: Left;   RIB PLATING N/A 05/25/2016   Procedure: STERNAL PLATING;  Surgeon: Elspeth BROCKS Millers, MD;  Location: [facility] OR;  Service: Thoracic;  Laterality: N/A;   right shoulder     STERNAL WIRES REMOVAL N/A 03/30/2016   Procedure: STERNAL WIRES REMOVAL;  Surgeon: Elspeth BROCKS Millers, MD;  Location: [facility] OR;  Service: Thoracic;  Laterality: N/A;   TONSILLECTOMY       reports that he quit smoking about 16 years ago. His smoking use included cigarettes. He started smoking about 51 years ago. He has a 35 pack-year smoking history. He has never used  smokeless tobacco. He reports that he does not drink alcohol and does not use drugs.  Allergies  Allergen Reactions   Contrast Media [Iodinated Contrast Media] Hives   Metrizamide Hives   Oxycodone Hcl Itching   Vicodin [Hydrocodone-Acetaminophen ] Itching    Family History  Problem Relation Age of Onset   Heart attack Father 30       MI   Cancer Maternal Uncle     Prior to Admission medications   Medication Sig Start Date End Date Taking? Authorizing Provider  atorvastatin  (LIPITOR ) 80 MG tablet Take 1 tablet (80 mg total) by mouth daily. 04/19/23  Yes Eubanks, Jessica K, NP  clindamycin (CLEOCIN T) 1 % external solution Apply topically. 09/13/23  Yes [provider]  ELIQUIS  2.5 MG TABS tablet Take 1 tablet (2.5 mg total) by mouth 2 (two) times daily. 04/19/23  Yes Caro Harlene POUR, NP  ezetimibe  (ZETIA ) 10 MG tablet Take 1 tablet (10 mg total) by mouth  daily. 04/19/23  Yes Caro Harlene POUR, NP  isosorbide  mononitrate (IMDUR ) 60 MG 24 hr tablet Take 1 tablet (60 mg total) by mouth 2 (two) times daily. 04/19/23  Yes Caro Harlene POUR, NP  nitroGLYCERIN  (NITROSTAT ) 0.4 MG SL tablet Place 1 tablet (0.4 mg total) under the tongue every 5 (five) minutes as needed for chest pain. 04/19/23  Yes Caro Harlene POUR, NP  ranolazine  (RANEXA ) 1000 MG SR tablet Take 1 tablet (1,000 mg total) by mouth 2 (two) times daily. 04/19/23  Yes Eubanks, Jessica K, NP  carbamide peroxide (DEBROX) 6.5 % OTIC solution Place 5 drops into both ears 2 (two) times daily. Patient not taking: Reported on 04/12/2023 04/05/23   Josette Ade, MD  cyanocobalamin  1000 MCG tablet Take 1 tablet (1,000 mcg total) by mouth daily. Patient not taking: Reported on 09/24/2023 04/06/23   Josette Ade, MD  diclofenac  Sodium (VOLTAREN ) 1 % GEL Apply 2 g topically 4 (four) times daily. Patient not taking: Reported on 04/12/2023 04/05/23   Josette Ade, MD  Ipratropium-Albuterol  (COMBIVENT) 20-100 MCG/ACT AERS respimat Inhale 2 puffs into the lungs 4 (four) times daily as needed for wheezing. Patient not taking: Reported on 09/24/2023 02/16/23 02/16/24  [provider]  levalbuterol (XOPENEX) 0.63 MG/3ML nebulizer solution Inhale into the lungs. Patient not taking: Reported on 04/12/2023 03/23/23 03/22/24  [provider]  midodrine  (PROAMATINE ) 10 MG tablet Take 1 tablet (10 mg total) by mouth 3 (three) times daily with meals. Patient not taking: Reported on 09/24/2023 04/19/23   Caro Harlene POUR, NP  OHTUVAYRE  3 MG/2.5ML SUSP  08/01/23   [provider]  senna-docusate (SENOKOT-S) 8.6-50 MG tablet Take 1 tablet by mouth at bedtime as needed for mild constipation. Patient not taking: Reported on 09/24/2023 04/05/23   Josette Ade, MD    Physical Exam: Vitals:   09/24/23 0922 09/24/23 0923 09/24/23 1235 09/24/23 1236  BP: 138/71   (!) 148/75  Pulse: (!) 57  (!) 56    Resp: 18  19   Temp: 98.1 F (36.7 C)     TempSrc: Axillary     SpO2: 96%  99%   Weight:  79.8 kg    Height:  5' 10 (1.778 m)      Physical Exam   Constitutional: Alert, awake, calm, comfortable HEENT: Neck supple Respiratory: Clear to auscultation B/L, no wheezing, no rales.  Cardiovascular: Regular rate and rhythm, no murmurs / rubs / gallops. No extremity edema. 2+ pedal pulses. No carotid bruits.  Abdomen: Soft, no  tenderness, Bowel sounds positive.  Musculoskeletal: no clubbing / cyanosis. Good ROM, no contractures. Normal muscle tone.  Skin: no rashes, lesions, ulcers. Neurologic: CN 2-12 grossly intact. Sensation intact, No focal deficit identified Psychiatric: Alert and oriented x 3. Normal mood.    Labs on Admission: I have personally reviewed following labs and imaging studies  CBC: Recent Labs  Lab 09/24/23 0950  WBC 5.3  NEUTROABS 2.8  HGB 14.7  HCT 44.2  MCV 97.4  PLT 200   Basic Metabolic Panel: Recent Labs  Lab 09/24/23 0950  NA 138  K 4.4  CL 103  CO2 26  GLUCOSE 106*  BUN 18  CREATININE 1.40*  CALCIUM  8.7*   GFR: Estimated Creatinine Clearance: 42.7 mL/min (A) (by C-G formula based on SCr of 1.4 mg/dL (H)). Liver Function Tests: Recent Labs  Lab 09/24/23 0950  AST 21  ALT 15  ALKPHOS 38  BILITOT 0.6  PROT 6.3*  ALBUMIN  3.1*   No results for input(s): LIPASE, AMYLASE in the last 168 hours. No results for input(s): AMMONIA in the last 168 hours. Coagulation Profile: Recent Labs  Lab 09/24/23 0950  INR 1.2   Cardiac Enzymes: Recent Labs  Lab 09/24/23 0950  TROPONINIHS 13   BNP (last 3 results) Recent Labs    12/08/22 1104 09/24/23 0950  BNP 112.6* 124.6*   HbA1C: No results for input(s): HGBA1C in the last 72 hours. CBG: No results for input(s): GLUCAP in the last 168 hours. Lipid Profile: No results for input(s): CHOL, HDL, LDLCALC, TRIG, CHOLHDL, LDLDIRECT in the last 72 hours. Thyroid   Function Tests: No results for input(s): TSH, T4TOTAL, FREET4, T3FREE, THYROIDAB in the last 72 hours. Anemia Panel: No results for input(s): VITAMINB12, FOLATE, FERRITIN, TIBC, IRON , RETICCTPCT in the last 72 hours. Urine analysis:    Component Value Date/Time   COLORURINE YELLOW (A) 03/27/2023 1454   APPEARANCEUR CLEAR (A) 03/27/2023 1454   APPEARANCEUR Hazy (A) 09/24/2018 1108   LABSPEC 1.016 03/27/2023 1454   PHURINE 6.0 03/27/2023 1454   GLUCOSEU NEGATIVE 03/27/2023 1454   HGBUR NEGATIVE 03/27/2023 1454   BILIRUBINUR NEGATIVE 03/27/2023 1454   BILIRUBINUR Negative 09/24/2018 1108   KETONESUR NEGATIVE 03/27/2023 1454   PROTEINUR NEGATIVE 03/27/2023 1454   NITRITE NEGATIVE 03/27/2023 1454   LEUKOCYTESUR NEGATIVE 03/27/2023 1454    Radiological Exams on Admission: I have personally reviewed images DG Chest 1 View Result Date: 09/24/2023 CLINICAL DATA:  Chest pain EXAM: CHEST  1 VIEW COMPARISON:  04/03/2023 FINDINGS: Low volume film. Cardiopericardial silhouette is at upper limits of normal for size. There is pulmonary vascular congestion without overt pulmonary edema. No pneumothorax or pleural effusion. Telemetry leads overlie the chest. IMPRESSION: Low volume film with pulmonary vascular congestion. Electronically Signed   By: Camellia Candle M.D.   On: 09/24/2023 09:44    EKG: My personal interpretation of EKG shows: Sinus bradycardia at 54 bpm with first-degree AV block and right bundle branch block.  No ST elevation.    Assessment/Plan Principal Problem:   Unstable angina (HCC) Active Problems:   Coronary artery disease involving coronary bypass graft of native heart with angina pectoris (HCC)   PAF (paroxysmal atrial fibrillation) (HCC)   Anxiety   Pulmonary embolus (HCC)    Assessment and Plan: 83 year old male with multiple medical problems including but not limited to coronary artery disease s/p coronary artery bypass graft s/p stent, atrial  fibrillation, pulmonary embolism, HTN who came into ED complaining of chest pain that started yesterday.  1.  Unstable angina/CAD - EKG showing new right bundle branch block and troponins only 13.  D-dimer was 0.55 with slight elevation from the baseline.  Patient continues to have chest pain, cardiology advised for heparin  drip and nitro drip and possible cardiac catheterization for unstable angina likely in the morning. - Continue to monitor in telemetry - Hold Imdur  as patient is getting nitro drip - Continue home medications including ranolazine .  2.  Atrial fibrillation - Currently on sinus rhythm - Patient is taking 2.5 mg of Eliquis  at home. - Patient is already on heparin  drip and Eliquis  will be not restarted at this point. - Continue to monitor in telemetry  3.  History of PE on Eliquis  - D-dimer is slightly elevated at 0.55 - Continue on heparin  drip - There is no hypoxemia - Patient is being planned for cardiac catheterization.  4.  Hyperlipidemia - Continue atorvastatin  80 mg at bedtime Continue Zetia  10 mg  5.  HTN - Blood pressure is stable - Continue to monitor - Patient blood pressure used to be low and was on midodrine  at some point. - Continue to monitor blood pressure and adjust medication as needed      DVT prophylaxis: IV heparin  gtts Code Status: Full Code Family Communication: Family was at bedside  Disposition Plan: Home  Consults called: Cardiology  Admission status: Inpatient, Telemetry bed   Nena Rebel, MD [facility] 09/24/2023, 1:10 PM

## 2023-09-24 NOTE — Consult Note (Signed)
 PHARMACY - ANTICOAGULATION CONSULT NOTE  Pharmacy Consult for Heparin  Indication: chest pain/ACS  Allergies  Allergen Reactions   Contrast Media [Iodinated Contrast Media] Hives   Metrizamide Hives   Oxycodone Hcl Itching   Vicodin [Hydrocodone-Acetaminophen ] Itching    Patient Measurements: Height: 5' 10 (177.8 cm) Weight: 79.8 kg (176 lb) IBW/kg (Calculated) : 73 HEPARIN  DW (KG): 79.8  Vital Signs: Temp: 98.1 F (36.7 C) (09/01 0922) Temp Source: Axillary (09/01 0922) BP: 138/71 (09/01 0922) Pulse Rate: 57 (09/01 0922)  Labs: Recent Labs    09/24/23 0950  HGB 14.7  HCT 44.2  PLT 200  APTT 23*  TROPONINIHS 13    CrCl cannot be calculated (Patient's most recent lab result is older than the maximum 21 days allowed.).   Medical History: Past Medical History:  Diagnosis Date   Arthritis    right hip, since a fall   CAD (coronary artery disease)    a. 04/2012 Cath: 3VD->Med Rx;  b. 04/2013 PCI RCA (2 DES); c. 03/2014 PCI: LAD 80 (3.0x23 Xience Alpine DES); d. 06/2014 CABG x 2 ([facility]) LIMA->LAD, VG->OM; e. 12/2014 Cath ([facility]): patent grafts->Med Rx; f. 08/2015 Cath: patent grafts; g. 11/2016 Cath: LM min irregs, LAD 20p, 30m, LCX 100p/m, RCA 20ost, 10p/m/d, VG->OM1 nl, LIMA->dLAD nl; f. 07/2017 MV: No isch, EF 51%.   Cardiomyopathy, ischemic    a. 04/2012 Echo: EF 40-45%;  b. 08/2013 Echo: EF 45-50%; c. 01/2015 Echo: EF 40-45%; d. 07/2016 Echo: EF 60-65%; e. 09/2017 Echo: EF 55-60%, Gr1 DD.   Carotid arterial disease (HCC)    a. 05/2013 Carotid U/S; bilat 40-50% ICA stenosis.   Chronic Chest Pain    USES NITRO   Chronic systolic CHF (congestive heart failure) (HCC)    a. 01/2015 Echo: EF 40-45%; b. 07/2016 Echo: EF 60-65%; c. 09/2017 Echo: EF 55-60%, Gr1 DD, nl RV fxn.   Cough    Diverticulosis    Dizziness    Dyspnea    WITH EXERTION   Headache 1970's   migraine   History of blood transfusion 2016   post op   Hyperlipidemia    Hypertension    Iron  deficiency anemia due  to chronic blood loss 09/11/2016   Myocardial infarction [facility])    unsure of when   Pulmonary emboli (HCC) 07/2016   a. On Xarelto .   Reflux esophagitis    Sternal pain    a. 03/2016 s/p Sternal wire removal; b. 05/2016 s/p redo median sternotomy for sternal debridement and sternal plating.   Syncope 04/2018   CARDIOLOGIST WAS DR PERLA AND HE WAS AWARE-NOW PT SEES PARASCHOS   Tubular adenoma of colon     Medications:  (Not in a hospital admission)  Scheduled:  Infusions:   heparin      nitroGLYCERIN  5 mcg/min (09/24/23 1041)   PRN:  Anti-infectives (From admission, onward)    None       Assessment: Patient is a 83 yo M admitted with chief complaint of chest pain that started day prior. PMH significant for CAD status post CABG and multiple stents, history of posthospital atrial fibrillation on Eliquis , history of PE, and history of prior tobacco use. Patient has been taking sublingual NTG every hour for the pain and has gotten minimal pain relief. EKG is showing sinus rhythm and not consistent with acute STEMI. Chest xray showing low volume film with pulmonary vascular congestion. Cardiology was consulted and planning for a cardiac cath tomorrow. Patient is on apixaban  at home for hx of  Afib. Last dose taken this morning at 8 am. Hgb and PLTs are stable. Will continue to monitor.   Goal of Therapy:  Heparin  level 0.3-0.7 units/ml aPTT 66-102 seconds Monitor platelets by anticoagulation protocol: Yes   Plan:  - Will not give a heparin  bolus due to patient taking apixaban  this AM - Will start heparin  infusion at 950 units/hr - Will order baseline heparin  level and INR added on to labs this AM - Will check aPTT level 8 hours starting heparin  infusion and until it correlates with heparin  level - Will monitor CBC and heparin  level daily.   Ransom Blanch PGY-1 Pharmacy Resident  [place] [facility]  09/24/2023 11:03 AM

## 2023-09-25 ENCOUNTER — Encounter
Admission: EM | Disposition: A | Discharge status: Home / Self Care | Payer: Self-pay | Source: Home / Self Care | Attending: Obstetrics and Gynecology

## 2023-09-25 DIAGNOSIS — Z888 Allergy status to other drugs, medicaments and biological substances status: Secondary | ICD-10-CM | POA: Diagnosis not present

## 2023-09-25 DIAGNOSIS — I480 Paroxysmal atrial fibrillation: Secondary | ICD-10-CM | POA: Diagnosis present

## 2023-09-25 DIAGNOSIS — I452 Bifascicular block: Secondary | ICD-10-CM | POA: Diagnosis not present

## 2023-09-25 DIAGNOSIS — Z955 Presence of coronary angioplasty implant and graft: Secondary | ICD-10-CM | POA: Diagnosis not present

## 2023-09-25 DIAGNOSIS — I440 Atrioventricular block, first degree: Secondary | ICD-10-CM | POA: Diagnosis not present

## 2023-09-25 DIAGNOSIS — Z7901 Long term (current) use of anticoagulants: Secondary | ICD-10-CM | POA: Diagnosis not present

## 2023-09-25 DIAGNOSIS — I25709 Atherosclerosis of coronary artery bypass graft(s), unspecified, with unspecified angina pectoris: Secondary | ICD-10-CM | POA: Diagnosis not present

## 2023-09-25 DIAGNOSIS — I200 Unstable angina: Secondary | ICD-10-CM | POA: Diagnosis present

## 2023-09-25 DIAGNOSIS — Z8249 Family history of ischemic heart disease and other diseases of the circulatory system: Secondary | ICD-10-CM | POA: Diagnosis not present

## 2023-09-25 DIAGNOSIS — Z860101 Personal history of adenomatous and serrated colon polyps: Secondary | ICD-10-CM | POA: Diagnosis not present

## 2023-09-25 DIAGNOSIS — I25700 Atherosclerosis of coronary artery bypass graft(s), unspecified, with unstable angina pectoris: Secondary | ICD-10-CM | POA: Diagnosis present

## 2023-09-25 DIAGNOSIS — Z885 Allergy status to narcotic agent status: Secondary | ICD-10-CM | POA: Diagnosis not present

## 2023-09-25 DIAGNOSIS — Z86711 Personal history of pulmonary embolism: Secondary | ICD-10-CM | POA: Diagnosis not present

## 2023-09-25 DIAGNOSIS — I5022 Chronic systolic (congestive) heart failure: Secondary | ICD-10-CM | POA: Diagnosis present

## 2023-09-25 DIAGNOSIS — I252 Old myocardial infarction: Secondary | ICD-10-CM | POA: Diagnosis not present

## 2023-09-25 DIAGNOSIS — Z87891 Personal history of nicotine dependence: Secondary | ICD-10-CM | POA: Diagnosis not present

## 2023-09-25 DIAGNOSIS — I110 Hypertensive heart disease with heart failure: Secondary | ICD-10-CM | POA: Diagnosis present

## 2023-09-25 DIAGNOSIS — R001 Bradycardia, unspecified: Secondary | ICD-10-CM | POA: Diagnosis not present

## 2023-09-25 DIAGNOSIS — D519 Vitamin B12 deficiency anemia, unspecified: Secondary | ICD-10-CM | POA: Diagnosis present

## 2023-09-25 DIAGNOSIS — E785 Hyperlipidemia, unspecified: Secondary | ICD-10-CM | POA: Diagnosis present

## 2023-09-25 DIAGNOSIS — N179 Acute kidney failure, unspecified: Secondary | ICD-10-CM | POA: Diagnosis present

## 2023-09-25 DIAGNOSIS — F419 Anxiety disorder, unspecified: Secondary | ICD-10-CM | POA: Diagnosis present

## 2023-09-25 DIAGNOSIS — Z79899 Other long term (current) drug therapy: Secondary | ICD-10-CM | POA: Diagnosis not present

## 2023-09-25 DIAGNOSIS — Z7982 Long term (current) use of aspirin: Secondary | ICD-10-CM | POA: Diagnosis not present

## 2023-09-25 DIAGNOSIS — Z91041 Radiographic dye allergy status: Secondary | ICD-10-CM | POA: Diagnosis not present

## 2023-09-25 DIAGNOSIS — I255 Ischemic cardiomyopathy: Secondary | ICD-10-CM | POA: Diagnosis present

## 2023-09-25 LAB — COMPREHENSIVE METABOLIC PANEL WITH GFR
ALT: 14 U/L (ref 0–44)
AST: 24 U/L (ref 15–41)
Albumin: 3.3 g/dL — ABNORMAL LOW (ref 3.5–5.0)
Alkaline Phosphatase: 39 U/L (ref 38–126)
Anion gap: 7 (ref 5–15)
BUN: 20 mg/dL (ref 8–23)
CO2: 26 mmol/L (ref 22–32)
Calcium: 8.4 mg/dL — ABNORMAL LOW (ref 8.9–10.3)
Chloride: 104 mmol/L (ref 98–111)
Creatinine, Ser: 1.07 mg/dL (ref 0.61–1.24)
GFR, Estimated: >60 mL/min (ref >60)
Glucose, Bld: 148 mg/dL — ABNORMAL HIGH (ref 70–99)
Potassium: 4.1 mmol/L (ref 3.5–5.1)
Sodium: 137 mmol/L (ref 135–145)
Total Bilirubin: 0.8 mg/dL (ref 0.0–1.2)
Total Protein: 6.8 g/dL (ref 6.5–8.1)

## 2023-09-25 LAB — CBC
HCT: 44.6 % (ref 39.0–52.0)
Hemoglobin: 14.6 g/dL (ref 13.0–17.0)
MCH: 31.9 pg (ref 26.0–34.0)
MCHC: 32.7 g/dL (ref 30.0–36.0)
MCV: 97.6 fL (ref 80.0–100.0)
Platelets: 191 K/uL (ref 150–400)
RBC: 4.57 MIL/uL (ref 4.22–5.81)
RDW: 13.7 % (ref 11.5–15.5)
WBC: 9.7 K/uL (ref 4.0–10.5)
nRBC: 0 % (ref 0.0–0.2)

## 2023-09-25 LAB — APTT: aPTT: 92 s — ABNORMAL HIGH (ref 24–36)

## 2023-09-25 LAB — HEPARIN LEVEL (UNFRACTIONATED): Heparin Unfractionated: 0.93 [IU]/mL — ABNORMAL HIGH (ref 0.30–0.70)

## 2023-09-25 LAB — GLUCOSE, CAPILLARY: Glucose-Capillary: 115 mg/dL — ABNORMAL HIGH (ref 70–99)

## 2023-09-25 SURGERY — LEFT HEART CATH AND CORONARY ANGIOGRAPHY
Anesthesia: Moderate Sedation | Laterality: Right

## 2023-09-25 MED ORDER — FREE WATER
500.0000 mL | Freq: Once | Status: AC
  Administered 2023-09-25: 500 mL via ORAL

## 2023-09-25 MED ORDER — ISOSORBIDE MONONITRATE ER 60 MG PO TB24: 60.0000 mg | ORAL_TABLET | Freq: Every day | ORAL | Status: DC

## 2023-09-25 MED ORDER — SODIUM CHLORIDE 0.9 % IV SOLN: 250.0000 mL | INTRAVENOUS | Status: AC | PRN

## 2023-09-25 MED ORDER — SODIUM CHLORIDE 0.9 % WEIGHT BASED INFUSION
1.0000 mL/kg/h | INTRAVENOUS | Status: DC
  Administered 2023-09-25: 1 mL/kg/h via INTRAVENOUS

## 2023-09-25 MED ORDER — ASPIRIN 81 MG PO CHEW
81.0000 mg | CHEWABLE_TABLET | ORAL | Status: AC
  Administered 2023-09-25: 81 mg via ORAL

## 2023-09-25 MED ORDER — ASPIRIN 81 MG PO CHEW
CHEWABLE_TABLET | ORAL | Status: AC
  Filled 2023-09-25: qty 1

## 2023-09-25 MED ORDER — LIDOCAINE HCL 1 % IJ SOLN
INTRAMUSCULAR | Status: AC
  Filled 2023-09-25: qty 20

## 2023-09-25 MED ORDER — IOHEXOL 300 MG/ML  SOLN
INTRAMUSCULAR | Status: DC | PRN
  Administered 2023-09-25: 110 mL

## 2023-09-25 MED ORDER — SODIUM CHLORIDE 0.9% FLUSH
3.0000 mL | Freq: Two times a day (BID) | INTRAVENOUS | Status: DC
  Administered 2023-09-25 – 2023-09-27 (×5): 3 mL via INTRAVENOUS

## 2023-09-25 MED ORDER — MIDAZOLAM HCL 2 MG/2ML IJ SOLN
INTRAMUSCULAR | Status: DC | PRN
  Administered 2023-09-25: 1 mg via INTRAVENOUS

## 2023-09-25 MED ORDER — HEPARIN (PORCINE) IN NACL 1000-0.9 UT/500ML-% IV SOLN
INTRAVENOUS | Status: AC
  Filled 2023-09-25: qty 1000

## 2023-09-25 MED ORDER — DIPHENHYDRAMINE HCL 50 MG/ML IJ SOLN
50.0000 mg | Freq: Once | INTRAMUSCULAR | Status: AC
  Administered 2023-09-25: 50 mg via INTRAVENOUS

## 2023-09-25 MED ORDER — SODIUM CHLORIDE 0.9% FLUSH: 3.0000 mL | INTRAVENOUS | Status: DC | PRN

## 2023-09-25 MED ORDER — DIPHENHYDRAMINE HCL 50 MG/ML IJ SOLN
INTRAMUSCULAR | Status: AC
  Filled 2023-09-25: qty 1

## 2023-09-25 MED ORDER — ASPIRIN 81 MG PO TBEC
81.0000 mg | DELAYED_RELEASE_TABLET | Freq: Every day | ORAL | Status: DC
  Administered 2023-09-26 – 2023-09-27 (×2): 81 mg via ORAL
  Filled 2023-09-25 (×2): qty 1

## 2023-09-25 MED ORDER — LIDOCAINE HCL (PF) 1 % IJ SOLN
INTRAMUSCULAR | Status: DC | PRN
  Administered 2023-09-25: 2 mL

## 2023-09-25 MED ORDER — LABETALOL HCL 5 MG/ML IV SOLN: 10.0000 mg | INTRAVENOUS | Status: AC | PRN

## 2023-09-25 MED ORDER — MIDAZOLAM HCL 2 MG/2ML IJ SOLN
INTRAMUSCULAR | Status: AC
  Filled 2023-09-25: qty 2

## 2023-09-25 MED ORDER — APIXABAN 2.5 MG PO TABS
2.5000 mg | ORAL_TABLET | Freq: Two times a day (BID) | ORAL | Status: DC
  Administered 2023-09-25 – 2023-09-27 (×3): 2.5 mg via ORAL
  Filled 2023-09-25 (×4): qty 1

## 2023-09-25 MED ORDER — METHYLPREDNISOLONE SODIUM SUCC 125 MG IJ SOLR
INTRAMUSCULAR | Status: AC
  Administered 2023-09-25: 125 mg
  Filled 2023-09-25: qty 2

## 2023-09-25 MED ORDER — ISOSORBIDE MONONITRATE ER 60 MG PO TB24
60.0000 mg | ORAL_TABLET | Freq: Two times a day (BID) | ORAL | Status: DC
  Administered 2023-09-26 – 2023-09-27 (×3): 60 mg via ORAL
  Filled 2023-09-25 (×3): qty 1

## 2023-09-25 MED ORDER — METHYLPREDNISOLONE SODIUM SUCC 125 MG IJ SOLR
125.0000 mg | Freq: Once | INTRAMUSCULAR | Status: AC
  Administered 2023-09-25: 125 mg via INTRAVENOUS

## 2023-09-25 MED ORDER — FENTANYL CITRATE (PF) 100 MCG/2ML IJ SOLN
INTRAMUSCULAR | Status: DC | PRN
  Administered 2023-09-25: 25 ug via INTRAVENOUS

## 2023-09-25 MED ORDER — DIPHENHYDRAMINE HCL 25 MG PO CAPS
25.0000 mg | ORAL_CAPSULE | Freq: Four times a day (QID) | ORAL | Status: DC | PRN
  Administered 2023-09-25: 25 mg via ORAL
  Filled 2023-09-25: qty 1

## 2023-09-25 MED ORDER — FAMOTIDINE IN NACL 20-0.9 MG/50ML-% IV SOLN
20.0000 mg | Freq: Once | INTRAVENOUS | Status: AC
  Administered 2023-09-25: 20 mg via INTRAVENOUS
  Filled 2023-09-25: qty 50

## 2023-09-25 MED ORDER — FENTANYL CITRATE (PF) 100 MCG/2ML IJ SOLN
INTRAMUSCULAR | Status: AC
  Filled 2023-09-25: qty 2

## 2023-09-25 MED ORDER — HEPARIN (PORCINE) IN NACL 1000-0.9 UT/500ML-% IV SOLN
INTRAVENOUS | Status: DC | PRN
  Administered 2023-09-25: 1000 mL

## 2023-09-25 MED ORDER — HYDRALAZINE HCL 20 MG/ML IJ SOLN: 10.0000 mg | INTRAMUSCULAR | Status: AC | PRN

## 2023-09-25 SURGICAL SUPPLY — 10 items
CATH INFINITI 5FR MULTPACK ANG (CATHETERS) IMPLANT
DEVICE CLOSURE MYNXGRIP 5F (Vascular Products) IMPLANT
KIT SYRINGE INJ CVI SPIKEX1 (MISCELLANEOUS) IMPLANT
NDL PERC 18GX7CM (NEEDLE) IMPLANT
NEEDLE PERC 18GX7CM (NEEDLE) ×1 IMPLANT
PACK CARDIAC CATH (CUSTOM PROCEDURE TRAY) ×1 IMPLANT
SET ATX-X65L (MISCELLANEOUS) IMPLANT
SHEATH AVANTI 5FR X 11CM (SHEATH) IMPLANT
STATION PROTECTION PRESSURIZED (MISCELLANEOUS) IMPLANT
WIRE GUIDERIGHT .035X150 (WIRE) IMPLANT

## 2023-09-25 NOTE — Progress Notes (Signed)
 Ambulated with patient in hallway per cardiology's request. Patient very eager to ambulate and possibly discharge and was walking briskly. I encouraged patient to slow down and take his time.   After one lap, patient stumbled slightly and stated that he was having some very mild chest pressure in the center of his chest. We stopped and I encouraged him to take some deep breaths. No change in vitals. Patient stated he felt better with rest and we continued to walk down the hallway.   About halfway through the next lap the patient stumbled again, grabbed for his chest, and gasped for air. Patient stated he was having sharp central chest pain and was dizzy and short of breath. I called for assistance and placed patient in the wheelchair and moved him back to his room. For about 5-10 minutes patient had intermittent stabbing chest pain.   After sitting on edge of bed with no activity, patient had no further complaints. All vitals remained WNL during the above. Dr. Kandis and Dec[place], PA with cardiology notified.

## 2023-09-25 NOTE — Progress Notes (Signed)
 PROGRESS NOTE    Scott [name]  FMW:[id number] DOB: [birth year] DOA: 09/24/2023 PCP: Rudolpho Norleen BIRCH, MD  Outpatient Specialists: cardiology    Brief Narrative:   From admission h and p  Scott [name] is a pleasant 83 y.o. male with medical history significant for CAD s/p CABG and multiple PCI's, atrial fibrillation on Eliquis , HLD, HTN, history of pulmonary embolism who came into the hospital with ongoing chest pain.  Patient stated that chest pain started yesterday which was 8/10 in intensity, pressure-like, constant, did not get better with nitroglycerin  and aspirin  at home.  He chest pain was associated with some shortness of breath and lightheadedness but no radiation.  He took 5 nitro at home and the pain rather got worse and decided come to the emergency room.  He denies any fever, chills, cough, palpitation, dysuria.    Assessment & Plan:   Principal Problem:   Unstable angina (HCC) Active Problems:   Coronary artery disease involving coronary bypass graft of native heart with angina pectoris (HCC)   PAF (paroxysmal atrial fibrillation) (HCC)   Anxiety   Pulmonary embolus (HCC)  # CAD # Angina, unstable Hx 2-vessel cabg. Ongoing exertional and rest chest pain. Trops negative here. LHC today shows 3-vessel CAD, no intervention performed. Chest pain and lightheadedness today ambulating after procedure. - repeat cath tomorrow with Dr. Katina - meds per cardiology, currently on atorva, apixaban , amlod, imdur , ranexa   # a-fib Not on BB due to baseline bradycardia - cont home apixaban   # History PE CTA no PE - cont home apixaban   # HTN BPs controlled - meds as above   DVT prophylaxis: apixaban  Code Status: full Family Communication: wife at bedside 9/2  Level of care: Telemetry Cardiac Status is: Observation    Consultants:  cardiology  Procedures: LHC 9/2  Antimicrobials:  none    Subjective: Feeling fine right now but earlier with ambulation central  chest pain and lightheadedness  Objective: Vitals:   09/25/23 1300 09/25/23 1341 09/25/23 1343 09/25/23 1349  BP: (!) 112/59 (!) 155/131 103/80 (!) 115/57  Pulse: 67 65 (!) 104 68  Resp: 18 17 14 18   Temp:      TempSrc:      SpO2: 100% 95% 100% 98%  Weight:      Height:        Intake/Output Summary (Last 24 hours) at 09/25/2023 1434 Last data filed at 09/25/2023 1300 Gross per 24 hour  Intake 1736.57 ml  Output 1450 ml  Net 286.57 ml   Filed Weights   09/24/23 0923 09/25/23 0516 09/25/23 0721  Weight: 79.8 kg 76.6 kg 76.6 kg    Examination:  General exam: Appears calm and comfortable  Respiratory system: Clear to auscultation. Respiratory effort normal. Cardiovascular system: S1 & S2 heard, no murmur Gastrointestinal system: Abdomen is nondistended, soft and nontender. Central nervous system: Alert and oriented. No focal neurological deficits. Extremities: Symmetric 5 x 5 power. No edema Skin: No rashes, lesions or ulcers Psychiatry: Judgement and insight appear normal. Mood & affect appropriate.     Data Reviewed: I have personally reviewed following labs and imaging studies  CBC: Recent Labs  Lab 09/24/23 0950 09/25/23 0520  WBC 5.3 9.7  NEUTROABS 2.8  --   HGB 14.7 14.6  HCT 44.2 44.6  MCV 97.4 97.6  PLT 200 191   Basic Metabolic Panel: Recent Labs  Lab 09/24/23 0950 09/25/23 0520  NA 138 137  K 4.4 4.1  CL 103 104  CO2  26 26  GLUCOSE 106* 148*  BUN 18 20  CREATININE 1.40* 1.07  CALCIUM  8.7* 8.4*   GFR: Estimated Creatinine Clearance: 55.9 mL/min (by C-G formula based on SCr of 1.07 mg/dL). Liver Function Tests: Recent Labs  Lab 09/24/23 0950 09/25/23 0520  AST 21 24  ALT 15 14  ALKPHOS 38 39  BILITOT 0.6 0.8  PROT 6.3* 6.8  ALBUMIN  3.1* 3.3*   No results for input(s): LIPASE, AMYLASE in the last 168 hours. No results for input(s): AMMONIA in the last 168 hours. Coagulation Profile: Recent Labs  Lab 09/24/23 0950  INR 1.2    Cardiac Enzymes: No results for input(s): CKTOTAL, CKMB, CKMBINDEX, TROPONINI in the last 168 hours. BNP (last 3 results) No results for input(s): PROBNP in the last 8760 hours. HbA1C: No results for input(s): HGBA1C in the last 72 hours. CBG: No results for input(s): GLUCAP in the last 168 hours. Lipid Profile: No results for input(s): CHOL, HDL, LDLCALC, TRIG, CHOLHDL, LDLDIRECT in the last 72 hours. Thyroid  Function Tests: No results for input(s): TSH, T4TOTAL, FREET4, T3FREE, THYROIDAB in the last 72 hours. Anemia Panel: No results for input(s): VITAMINB12, FOLATE, FERRITIN, TIBC, IRON , RETICCTPCT in the last 72 hours. Urine analysis:    Component Value Date/Time   COLORURINE YELLOW (A) 03/27/2023 1454   APPEARANCEUR CLEAR (A) 03/27/2023 1454   APPEARANCEUR Hazy (A) 09/24/2018 1108   LABSPEC 1.016 03/27/2023 1454   PHURINE 6.0 03/27/2023 1454   GLUCOSEU NEGATIVE 03/27/2023 1454   HGBUR NEGATIVE 03/27/2023 1454   BILIRUBINUR NEGATIVE 03/27/2023 1454   BILIRUBINUR Negative 09/24/2018 1108   KETONESUR NEGATIVE 03/27/2023 1454   PROTEINUR NEGATIVE 03/27/2023 1454   NITRITE NEGATIVE 03/27/2023 1454   LEUKOCYTESUR NEGATIVE 03/27/2023 1454   Sepsis Labs: @LABRCNTIP (procalcitonin:4,lacticidven:4)  )No results found for this or any previous visit (from the past 240 hours).       Radiology Studies: CARDIAC CATHETERIZATION Result Date: 09/25/2023   Ost Cx to Prox Cx lesion is 80% stenosed.   2nd Mrg lesion is 100% stenosed.   Prox LAD lesion is 50% stenosed.   Ost LM to Mid LM lesion is 40% stenosed.   Non-stenotic Prox LAD to Mid LAD lesion was previously treated.   Non-stenotic Ost RCA to Mid RCA lesion was previously treated.   Non-stenotic Mid RCA lesion was previously treated.   There is mild left ventricular systolic dysfunction.   LV end diastolic pressure is normal.   The left ventricular ejection fraction is 35-45% by  visual estimate. 1.  Three-vessel coronary artery disease with patent stent proximal/mid LAD, diffuse 80% stenosis ostial/proximal very small caliber left circumflex, patent stent proximal and mid RCA, with atretic but patent LIMA to distal LAD, and patent SVG to ramus intermedius branch, overall appears unchanged compared to prior cardiac catheterization 05/25/2020 2.  Mildly reduced left ventricular function with anterior apical hypokinesis, estimate LVEF 40% Recommendations 1.  Continue medical therapy 2.  Maximize antianginals 3.  Aggressive risk factor modification 4.  May discharge home later today   CT Angio Chest Pulmonary Embolism (PE) W or WO Contrast Result Date: 09/24/2023 CLINICAL DATA:  Pulmonary embolism (PE) suspected, high prob Chest pain. EXAM: CT ANGIOGRAPHY CHEST WITH CONTRAST TECHNIQUE: Multidetector CT imaging of the chest was performed using the standard protocol during bolus administration of intravenous contrast. Multiplanar CT image reconstructions and MIPs were obtained to evaluate the vascular anatomy. Patient received 4 hour premedication for contrast allergy. RADIATION DOSE REDUCTION: This exam was performed according to  the departmental dose-optimization program which includes automated exposure control, adjustment of the mA and/or kV according to patient size and/or use of iterative reconstruction technique. CONTRAST:  75mL OMNIPAQUE  IOHEXOL  350 MG/ML SOLN COMPARISON:  Radiograph earlier today. Chest CT 05/17/2023, high-resolution chest CT 08/01/2019 FINDINGS: Cardiovascular: There are no filling defects within the pulmonary arteries to suggest pulmonary embolus. Prior CABG. Dense calcification of native coronary arteries. The heart is mildly enlarged. Aortic atherosclerosis without acute aortic findings. No pericardial effusion. Mediastinum/Nodes: Stable shotty mediastinal lymph nodes, some of which are calcified. There are prominent bilateral hilar nodes. The esophagus is  decompressed. Lungs/Pleura: Emphysema. Heterogeneous pulmonary parenchyma with areas of interspersed ground-glass. Overall low lung volumes with motion artifact. Assessment of bronchiectasis seen on prior is limited due to motion. No pleural effusion. Bowing of the central bronchi may represent bronchomalacia. No pulmonary mass. Upper Abdomen: No acute findings.  Gallstones. Musculoskeletal: Prior median sternotomy. There are no acute or suspicious osseous abnormalities. Review of the MIP images confirms the above findings. IMPRESSION: 1. No pulmonary embolus. 2. Heterogeneous pulmonary parenchyma with areas of interspersed ground-glass, likely hypoventilatory change superimposed on chronic interstitial lung disease. 3. Bowing of the central bronchi may represent bronchomalacia. 4. Cholelithiasis. Aortic Atherosclerosis (ICD10-I70.0) and Emphysema (ICD10-J43.9). Electronically Signed   By: Andrea Gasman M.D.   On: 09/24/2023 20:23   DG Chest 1 View Result Date: 09/24/2023 CLINICAL DATA:  Chest pain EXAM: CHEST  1 VIEW COMPARISON:  04/03/2023 FINDINGS: Low volume film. Cardiopericardial silhouette is at upper limits of normal for size. There is pulmonary vascular congestion without overt pulmonary edema. No pneumothorax or pleural effusion. Telemetry leads overlie the chest. IMPRESSION: Low volume film with pulmonary vascular congestion. Electronically Signed   By: Camellia Candle M.D.   On: 09/24/2023 09:44        Scheduled Meds:  amLODipine   10 mg Oral Daily   apixaban   2.5 mg Oral BID   [START ON 09/26/2023] aspirin  EC  81 mg Oral Daily   atorvastatin   80 mg Oral Daily   cyanocobalamin   1,000 mcg Oral Daily   ezetimibe   10 mg Oral Daily   [START ON 09/26/2023] isosorbide  mononitrate  60 mg Oral BID   losartan   25 mg Oral Daily   ranolazine   1,000 mg Oral BID   sodium chloride  flush  3 mL Intravenous Q12H   Continuous Infusions:  sodium chloride        LOS: 1 day     Devaughn KATHEE Ban, MD [facility]   If 7PM-7AM, please contact night-coverage [email or web address] Password [email or web address] 09/25/2023, 2:34 PM

## 2023-09-25 NOTE — Progress Notes (Signed)
 PHARMACY - ANTICOAGULATION CONSULT NOTE  Pharmacy Consult for Heparin  Indication: chest pain/ACS  Allergies  Allergen Reactions   Contrast Media [Iodinated Contrast Media] Hives   Metrizamide Hives   Oxycodone Hcl Itching   Vicodin [Hydrocodone-Acetaminophen ] Itching    Patient Measurements: Height: 5' 10 (177.8 cm) Weight: 79.8 kg (176 lb) IBW/kg (Calculated) : 73 HEPARIN  DW (KG): 79.8  Vital Signs: Temp: 97.9 F (36.6 C) (09/02 0400) Temp Source: Axillary (09/02 0400) BP: 100/46 (09/02 0400) Pulse Rate: 64 (09/02 0400)  Labs: Recent Labs    09/24/23 0950 09/24/23 1249 09/24/23 1450 09/24/23 1637 09/24/23 1843 09/25/23 0520  HGB 14.7  --   --   --   --  14.6  HCT 44.2  --   --   --   --  44.6  PLT 200  --   --   --   --  191  APTT 23*  --   --   --  75* 92*  LABPROT 15.4*  --   --   --   --   --   INR 1.2  --   --   --   --   --   HEPARINUNFRC >1.10*  --   --   --   --  0.93*  CREATININE 1.40*  --   --   --   --   --   TROPONINIHS 13 15 14 13   --   --     Estimated Creatinine Clearance: 42.7 mL/min (A) (by C-G formula based on SCr of 1.4 mg/dL (H)).   Medical History: Past Medical History:  Diagnosis Date   Arthritis    right hip, since a fall   CAD (coronary artery disease)    a. 04/2012 Cath: 3VD->Med Rx;  b. 04/2013 PCI RCA (2 DES); c. 03/2014 PCI: LAD 80 (3.0x23 Xience Alpine DES); d. 06/2014 CABG x 2 ([facility]) LIMA->LAD, VG->OM; e. 12/2014 Cath ([facility]): patent grafts->Med Rx; f. 08/2015 Cath: patent grafts; g. 11/2016 Cath: LM min irregs, LAD 20p, 41m, LCX 100p/m, RCA 20ost, 10p/m/d, VG->OM1 nl, LIMA->dLAD nl; f. 07/2017 MV: No isch, EF 51%.   Cardiomyopathy, ischemic    a. 04/2012 Echo: EF 40-45%;  b. 08/2013 Echo: EF 45-50%; c. 01/2015 Echo: EF 40-45%; d. 07/2016 Echo: EF 60-65%; e. 09/2017 Echo: EF 55-60%, Gr1 DD.   Carotid arterial disease (HCC)    a. 05/2013 Carotid U/S; bilat 40-50% ICA stenosis.   Chronic Chest Pain    USES NITRO   Chronic systolic CHF  (congestive heart failure) (HCC)    a. 01/2015 Echo: EF 40-45%; b. 07/2016 Echo: EF 60-65%; c. 09/2017 Echo: EF 55-60%, Gr1 DD, nl RV fxn.   Cough    Diverticulosis    Dizziness    Dyspnea    WITH EXERTION   Headache 1970's   migraine   History of blood transfusion 2016   post op   Hyperlipidemia    Hypertension    Iron  deficiency anemia due to chronic blood loss 09/11/2016   Myocardial infarction [facility])    unsure of when   Pulmonary emboli (HCC) 07/2016   a. On Xarelto .   Reflux esophagitis    Sternal pain    a. 03/2016 s/p Sternal wire removal; b. 05/2016 s/p redo median sternotomy for sternal debridement and sternal plating.   Syncope 04/2018   CARDIOLOGIST WAS DR PERLA AND HE WAS AWARE-NOW PT SEES PARASCHOS   Tubular adenoma of colon     Medications:  Scheduled:  amLODipine   10 mg Oral Daily   [START ON 09/26/2023] aspirin   81 mg Oral Pre-Cath   atorvastatin   80 mg Oral Daily   cyanocobalamin   1,000 mcg Oral Daily   ezetimibe   10 mg Oral Daily   losartan   25 mg Oral Daily   ranolazine   1,000 mg Oral BID   Infusions:   sodium chloride      heparin  950 Units/hr (09/24/23 1807)   nitroGLYCERIN  Stopped (09/25/23 0114)    Assessment: 83 yo M presenting with chest pain. Patient on Eliquis  PTA for atrial fibrillation. EKG not consistent with acute STEMI per MD note. Last PTA Eliquis  dose 9/1 AM. Pharmacy consulted to dose heparin  for ACS.   9/1 1843: aPTT 75, therapeutic x 1 9/2 0520 aPTT 92, therapeutic x 2 / HL 0.93, not correlating  Goal of Therapy:  Heparin  level 0.3-0.7 units/ml aPTT 66-102 seconds Monitor platelets by anticoagulation protocol: Yes   Plan:  Continue heparin  infusion at 950 units/hr. Recheck aPTT daily w/ AM labs while therapeutic. Will use aPTT to monitor heparin .  Switch to heparin  level monitoring once aPTT and heparin  level correlate.  Daily CBC and monitor for signs/symptoms of bleeding.  Rankin CANDIE Dills, PharmD, [facility] 09/25/2023 6:04  AM

## 2023-09-25 NOTE — Plan of Care (Signed)
  Problem: Clinical Measurements: Goal: Cardiovascular complication will be avoided Outcome: Not Progressing   Problem: Activity: Goal: Risk for activity intolerance will decrease Outcome: Not Progressing

## 2023-09-25 NOTE — Care Management CC44 (Signed)
 Condition Code 44 Documentation Completed  Patient Details  Name: HUTTON PELLICANE MRN: [id number] Date of Birth: 22-Nov-1940   Condition Code 44 given:  Yes Patient signature on Condition Code 44 notice:  Yes Documentation of 2 MD's agreement:  Yes Code 44 added to claim:  Yes    Lauraine JAYSON Carpen, LCSW 09/25/2023, 3:05 PM

## 2023-09-25 NOTE — Progress Notes (Signed)
 Patient refusing to sign consent for procedure, as well as anything else regarding it (fluids, aspirin ) until he speaks with a doctor because he isn't sure he wants to move forward.

## 2023-09-25 NOTE — Progress Notes (Addendum)
 [facility] CLINIC CARDIOLOGY PROGRESS NOTE       Patient ID: Scott [name] MRN: [id number] DOB/AGE: [birth year] 83 y.o.  Admit date: 09/24/2023 Referring Physician Dr. Roann Primary Physician Rudolpho Norleen BIRCH, MD Primary Cardiologist Dr. Ammon Reason for Consultation chest pain  HPI: Scott [name] is a 83 y.o. male  with a past medical history of coronary artery disease s/p multiple stents and CABG x 2 (LIMA to LAD, SVG to ramus-06/2014), ischemic cardiomyopathy, paroxysmal atrial fibrillation, history of pulmonary embolism, history of bradycardia who presented to the ED on 09/24/2023 for chest pain associated with SOB, lightheadedness. Patient states he is very active and doesn't have chest pain on exertion. Also states SL nitroglycerin  doesn't relieve his chest pain. Cardiology was consulted for further evaluation for unstable angina.   Interval History: -Patient seen and examined this afternoon and laying comfortably. Patient states he feels well and denies any more recurrence of chest pain or SOB.  -Patients BP and HR stable this afternoon. Overnight Tele showed no significant events.  -Patient remains on room air with stable SpO2.  -Right groin incision site is clean and dry with no evidence of significant swelling, bruising, or active bleeding.  - Patient underwent LHC with Dr. Ammon this a.m. as stated below.  Patient tolerated procedure well.  LHC (09/25/2023)   Ost Cx to Prox Cx lesion is 80% stenosed.   2nd Mrg lesion is 100% stenosed.   Prox LAD lesion is 50% stenosed.   Ost LM to Mid LM lesion is 40% stenosed.   Non-stenotic Prox LAD to Mid LAD lesion was previously treated.   Non-stenotic Ost RCA to Mid RCA lesion was previously treated.   Non-stenotic Mid RCA lesion was previously treated.   There is mild left ventricular systolic dysfunction.   LV end diastolic pressure is normal.   The left ventricular ejection fraction is 35-45% by visual estimate.   1.   Three-vessel coronary artery disease with patent stent proximal/mid LAD, diffuse 80% stenosis ostial/proximal very small caliber left circumflex, patent stent proximal and mid RCA, with atretic but patent LIMA to distal LAD, and patent SVG to ramus intermedius branch, overall appears unchanged compared to prior cardiac catheterization 05/25/2020 2.  Mildly reduced left ventricular function with anterior apical hypokinesis, estimate LVEF 40%   Review of systems complete and found to be negative unless listed above    Past Medical History:  Diagnosis Date   Arthritis    right hip, since a fall   CAD (coronary artery disease)    a. 04/2012 Cath: 3VD->Med Rx;  b. 04/2013 PCI RCA (2 DES); c. 03/2014 PCI: LAD 80 (3.0x23 Xience Alpine DES); d. 06/2014 CABG x 2 ([facility]) LIMA->LAD, VG->OM; e. 12/2014 Cath ([facility]): patent grafts->Med Rx; f. 08/2015 Cath: patent grafts; g. 11/2016 Cath: LM min irregs, LAD 20p, 5m, LCX 100p/m, RCA 20ost, 10p/m/d, VG->OM1 nl, LIMA->dLAD nl; f. 07/2017 MV: No isch, EF 51%.   Cardiomyopathy, ischemic    a. 04/2012 Echo: EF 40-45%;  b. 08/2013 Echo: EF 45-50%; c. 01/2015 Echo: EF 40-45%; d. 07/2016 Echo: EF 60-65%; e. 09/2017 Echo: EF 55-60%, Gr1 DD.   Carotid arterial disease (HCC)    a. 05/2013 Carotid U/S; bilat 40-50% ICA stenosis.   Chronic Chest Pain    USES NITRO   Chronic systolic CHF (congestive heart failure) (HCC)    a. 01/2015 Echo: EF 40-45%; b. 07/2016 Echo: EF 60-65%; c. 09/2017 Echo: EF 55-60%, Gr1 DD, nl RV fxn.   Cough  Diverticulosis    Dizziness    Dyspnea    WITH EXERTION   Headache 1970's   migraine   History of blood transfusion 2016   post op   Hyperlipidemia    Hypertension    Iron  deficiency anemia due to chronic blood loss 09/11/2016   Myocardial infarction [facility])    unsure of when   Pulmonary emboli (HCC) 07/2016   a. On Xarelto .   Reflux esophagitis    Sternal pain    a. 03/2016 s/p Sternal wire removal; b. 05/2016 s/p redo median sternotomy for  sternal debridement and sternal plating.   Syncope 04/2018   CARDIOLOGIST WAS DR PERLA AND HE WAS AWARE-NOW PT SEES PARASCHOS   Tubular adenoma of colon     Past Surgical History:  Procedure Laterality Date   CARDIAC CATHETERIZATION  05/01/2012   CARDIAC CATHETERIZATION  04/2013   [facility];x3 stent   CARDIAC CATHETERIZATION  01/11/15    [facility]   CARDIAC CATHETERIZATION N/A 09/16/2015   Procedure: LEFT HEART CATH AND CORS/GRAFTS ANGIOGRAPHY;  Surgeon: Evalene JINNY PERLA, MD;  Location: [facility] INVASIVE CV LAB;  Service: Cardiovascular;  Laterality: N/A;   CARPAL TUNNEL RELEASE     right hand   CATARACT EXTRACTION     LEFT   CATARACT EXTRACTION W/PHACO Left 09/16/2014   Procedure: CATARACT EXTRACTION PHACO AND INTRAOCULAR LENS PLACEMENT (IOC);  Surgeon: Dene Etienne, MD;  Location: [facility]ERY [facility];  Service: Ophthalmology;  Laterality: Left;   COLONOSCOPY     COLONOSCOPY WITH PROPOFOL  N/A 01/28/2016   Procedure: COLONOSCOPY WITH PROPOFOL ;  Surgeon: Gladis RAYMOND Mariner, MD;  Location: [facility] ENDOSCOPY;  Service: Endoscopy;  Laterality: N/A;   COLONOSCOPY WITH PROPOFOL  N/A 08/23/2021   Procedure: COLONOSCOPY WITH PROPOFOL ;  Surgeon: Unk Corinn Skiff, MD;  Location: [facility] ENDOSCOPY;  Service: Gastroenterology;  Laterality: N/A;  REQUESTS 9AM OR LATER   CORONARY ANGIOPLASTY WITH STENT PLACEMENT  04/13/2014   CORONARY ARTERY BYPASS GRAFT  06-26-14   x3 bypasses   CORONARY PRESSURE/FFR WITH 3D MAPPING N/A 12/10/2018   Procedure: KEN PRESSURE WIRE/FFR STUDY;  Surgeon: Ammon Blunt, MD;  Location: [facility] INVASIVE CV LAB;  Service: Cardiovascular;  Laterality: N/A;   CORONARY ULTRASOUND/IVUS N/A 12/10/2018   Procedure: Intravascular Ultrasound/IVUS;  Surgeon: Ammon Blunt, MD;  Location: [facility] INVASIVE CV LAB;  Service: Cardiovascular;  Laterality: N/A;   CYSTOSCOPY N/A 08/14/2018   Procedure: CYSTOSCOPY;  Surgeon: Twylla Glendia BROCKS, MD;  Location: [facility] ORS;  Service: Urology;   Laterality: N/A;   DE QUERVAIN'S RELEASE Left 08/22/2012   ESOPHAGOGASTRODUODENOSCOPY (EGD) WITH PROPOFOL  N/A 04/26/2015   Procedure: ESOPHAGOGASTRODUODENOSCOPY (EGD) WITH PROPOFOL ;  Surgeon: Deward CINDERELLA Piedmont, MD;  Location: [facility] ENDOSCOPY;  Service: Gastroenterology;  Laterality: N/A;   ESOPHAGOGASTRODUODENOSCOPY (EGD) WITH PROPOFOL  N/A 05/29/2017   Procedure: ESOPHAGOGASTRODUODENOSCOPY (EGD) WITH PROPOFOL ;  Surgeon: Mariner Gladis RAYMOND, MD;  Location: [facility] ENDOSCOPY;  Service: Endoscopy;  Laterality: N/A;   LEFT HEART CATH AND CORONARY ANGIOGRAPHY N/A 12/04/2016   Procedure: LEFT HEART CATH AND CORS/GRAFTS  ANGIOGRAPHY;  Surgeon: Darron Deatrice LABOR, MD;  Location: [facility] INVASIVE CV LAB;  Service: Cardiovascular;  Laterality: N/A;   LEFT HEART CATH AND CORS/GRAFTS ANGIOGRAPHY N/A 04/09/2018   Procedure: LEFT HEART CATH AND CORS/GRAFTS ANGIOGRAPHY;  Surgeon: Ammon Blunt, MD;  Location: [facility] INVASIVE CV LAB;  Service: Cardiovascular;  Laterality: N/A;   LEFT HEART CATH AND CORS/GRAFTS ANGIOGRAPHY N/A 12/10/2018   Procedure: LEFT HEART CATH AND CORS/GRAFTS ANGIOGRAPHY;  Surgeon: Ammon Blunt, MD;  Location: [facility] INVASIVE CV LAB;  Service: Cardiovascular;  Laterality: N/A;   LEFT HEART CATH AND CORS/GRAFTS ANGIOGRAPHY Left 05/25/2020   Procedure: LEFT HEART CATH AND CORS/GRAFTS ANGIOGRAPHY;  Surgeon: Ammon Blunt, MD;  Location: [facility] INVASIVE CV LAB;  Service: Cardiovascular;  Laterality: Left;   RIB PLATING N/A 05/25/2016   Procedure: STERNAL PLATING;  Surgeon: Elspeth JAYSON Millers, MD;  Location: [facility] OR;  Service: Thoracic;  Laterality: N/A;   right shoulder     STERNAL WIRES REMOVAL N/A 03/30/2016   Procedure: STERNAL WIRES REMOVAL;  Surgeon: Elspeth JAYSON Millers, MD;  Location: [facility] OR;  Service: Thoracic;  Laterality: N/A;   TONSILLECTOMY      Medications Prior to Admission  Medication Sig Dispense Refill Last Dose/Taking   atorvastatin  (LIPITOR ) 80 MG tablet Take 1 tablet (80 mg total) by  mouth daily. 30 tablet 0 09/23/2023   clindamycin (CLEOCIN T) 1 % external solution Apply topically.   09/23/2023   ELIQUIS  2.5 MG TABS tablet Take 1 tablet (2.5 mg total) by mouth 2 (two) times daily. 60 tablet 0 09/24/2023 at  8:00 AM   ezetimibe  (ZETIA ) 10 MG tablet Take 1 tablet (10 mg total) by mouth daily. 30 tablet 0 09/23/2023   isosorbide  mononitrate (IMDUR ) 60 MG 24 hr tablet Take 1 tablet (60 mg total) by mouth 2 (two) times daily. 60 tablet 0 09/24/2023 Morning   nitroGLYCERIN  (NITROSTAT ) 0.4 MG SL tablet Place 1 tablet (0.4 mg total) under the tongue every 5 (five) minutes as needed for chest pain. 30 tablet 0 Unknown   ranolazine  (RANEXA ) 1000 MG SR tablet Take 1 tablet (1,000 mg total) by mouth 2 (two) times daily. 60 tablet 0 09/24/2023 Morning   carbamide peroxide (DEBROX) 6.5 % OTIC solution Place 5 drops into both ears 2 (two) times daily. (Patient not taking: Reported on 04/12/2023) 15 mL 0    diclofenac  Sodium (VOLTAREN ) 1 % GEL Apply 2 g topically 4 (four) times daily. (Patient not taking: Reported on 04/12/2023) 100 g 0    Ipratropium-Albuterol  (COMBIVENT) 20-100 MCG/ACT AERS respimat Inhale 2 puffs into the lungs 4 (four) times daily as needed for wheezing. (Patient not taking: Reported on 09/24/2023)   Not Taking   levalbuterol (XOPENEX) 0.63 MG/3ML nebulizer solution Inhale into the lungs. (Patient not taking: Reported on 04/12/2023)      midodrine  (PROAMATINE ) 10 MG tablet Take 1 tablet (10 mg total) by mouth 3 (three) times daily with meals. (Patient not taking: Reported on 09/24/2023) 90 tablet 0 Not Taking   OHTUVAYRE  3 MG/2.5ML SUSP  (Patient not taking: Reported on 09/24/2023)   Not Taking   senna-docusate (SENOKOT-S) 8.6-50 MG tablet Take 1 tablet by mouth at bedtime as needed for mild constipation. (Patient not taking: Reported on 09/24/2023) 30 tablet 0 Not Taking   Social History   Socioeconomic History   Marital status: Married    Spouse name: Not on file   Number of children:  Not on file   Years of education: Not on file   Highest education level: Not on file  Occupational History   Not on file  Tobacco Use   Smoking status: Former    Current packs/day: 0.00    Average packs/day: 1 pack/day for 35.0 years (35.0 ttl pk-yrs)    Types: Cigarettes    Start date: 13    Quit date: 2009    Years since quitting: 16.6   Smokeless tobacco: Never  Vaping Use   Vaping status: Never Used  Substance and Sexual Activity   Alcohol  use: No   Drug use: No   Sexual activity: Yes  Other Topics Concern   Not on file  Social History Narrative   Not on file   Social Drivers of Health   Financial Resource Strain: Low Risk  (08/14/2023)   Received from [facility]   Overall Financial Resource Strain (CARDIA)    Difficulty of Paying Living Expenses: Not hard at all  Food Insecurity: No Food Insecurity (09/24/2023)   Hunger Vital Sign    Worried About Running Out of Food in the Last Year: Never true    Ran Out of Food in the Last Year: Never true  Transportation Needs: No Transportation Needs (09/24/2023)   PRAPARE - Administrator, Civil Service (Medical): No    Lack of Transportation (Non-Medical): No  Physical Activity: Not on file  Stress: Not on file  Social Connections: Moderately Integrated (09/24/2023)   Social Connection and Isolation Panel    Frequency of Communication with Friends and Family: Three times a week    Frequency of Social Gatherings with Friends and Family: More than three times a week    Attends Religious Services: More than 4 times per year    Active Member of Clubs or Organizations: No    Attends Banker Meetings: Patient declined    Marital Status: Married  Catering manager Violence: Not At Risk (09/24/2023)   Humiliation, Afraid, Rape, and Kick questionnaire    Fear of Current or Ex-Partner: No    Emotionally Abused: No    Physically Abused: No    Sexually Abused: No    Family History  Problem  Relation Age of Onset   Heart attack Father 5       MI   Cancer Maternal Uncle      Vitals:   09/25/23 1030 09/25/23 1100 09/25/23 1200 09/25/23 1222  BP: (!) 110/57 (!) 121/56 (!) 120/55 (!) 120/55  Pulse: 65 66 (!) 59   Resp: 16 14 14    Temp:    (!) 97.4 F (36.3 C)  TempSrc:    Oral  SpO2: 98% 98% 97%   Weight:      Height:        PHYSICAL EXAM General: Well-appearing elderly male, well nourished, in no acute distress. HEENT: Normocephalic and atraumatic. Neck: No JVD.   Lungs: Normal respiratory effort on room air. Clear bilaterally to auscultation. No wheezes, crackles, rhonchi.  Heart: HRRR. Normal S1 and S2 without gallops or murmurs.  Abdomen: Non-distended appearing.  Msk: Normal strength and tone for age. Extremities: Warm and well perfused. No clubbing, cyanosis, edema.  Neuro: Alert and oriented X 3. Psych: Answers questions appropriately.   Labs: Basic Metabolic Panel: Recent Labs    09/24/23 0950 09/25/23 0520  NA 138 137  K 4.4 4.1  CL 103 104  CO2 26 26  GLUCOSE 106* 148*  BUN 18 20  CREATININE 1.40* 1.07  CALCIUM  8.7* 8.4*   Liver Function Tests: Recent Labs    09/24/23 0950 09/25/23 0520  AST 21 24  ALT 15 14  ALKPHOS 38 39  BILITOT 0.6 0.8  PROT 6.3* 6.8  ALBUMIN  3.1* 3.3*   No results for input(s): LIPASE, AMYLASE in the last 72 hours. CBC: Recent Labs    09/24/23 0950 09/25/23 0520  WBC 5.3 9.7  NEUTROABS 2.8  --   HGB 14.7 14.6  HCT 44.2 44.6  MCV 97.4 97.6  PLT 200 191   Cardiac Enzymes: Recent  Labs    09/24/23 1249 09/24/23 1450 09/24/23 1637  TROPONINIHS 15 14 13    BNP: Recent Labs    09/24/23 0950  BNP 124.6*   D-Dimer: Recent Labs    09/24/23 0950  DDIMER 0.55*   Hemoglobin A1C: No results for input(s): HGBA1C in the last 72 hours. Fasting Lipid Panel: No results for input(s): CHOL, HDL, LDLCALC, TRIG, CHOLHDL, LDLDIRECT in the last 72 hours. Thyroid  Function Tests: No results  for input(s): TSH, T4TOTAL, T3FREE, THYROIDAB in the last 72 hours.  Invalid input(s): FREET3 Anemia Panel: No results for input(s): VITAMINB12, FOLATE, FERRITIN, TIBC, IRON , RETICCTPCT in the last 72 hours.   Radiology: CARDIAC CATHETERIZATION Result Date: 09/25/2023   Ost Cx to Prox Cx lesion is 80% stenosed.   2nd Mrg lesion is 100% stenosed.   Prox LAD lesion is 50% stenosed.   Ost LM to Mid LM lesion is 40% stenosed.   Non-stenotic Prox LAD to Mid LAD lesion was previously treated.   Non-stenotic Ost RCA to Mid RCA lesion was previously treated.   Non-stenotic Mid RCA lesion was previously treated.   There is mild left ventricular systolic dysfunction.   LV end diastolic pressure is normal.   The left ventricular ejection fraction is 35-45% by visual estimate. 1.  Three-vessel coronary artery disease with patent stent proximal/mid LAD, diffuse 80% stenosis ostial/proximal very small caliber left circumflex, patent stent proximal and mid RCA, with atretic but patent LIMA to distal LAD, and patent SVG to ramus intermedius branch, overall appears unchanged compared to prior cardiac catheterization 05/25/2020 2.  Mildly reduced left ventricular function with anterior apical hypokinesis, estimate LVEF 40% Recommendations 1.  Continue medical therapy 2.  Maximize antianginals 3.  Aggressive risk factor modification 4.  May discharge home later today   CT Angio Chest Pulmonary Embolism (PE) W or WO Contrast Result Date: 09/24/2023 CLINICAL DATA:  Pulmonary embolism (PE) suspected, high prob Chest pain. EXAM: CT ANGIOGRAPHY CHEST WITH CONTRAST TECHNIQUE: Multidetector CT imaging of the chest was performed using the standard protocol during bolus administration of intravenous contrast. Multiplanar CT image reconstructions and MIPs were obtained to evaluate the vascular anatomy. Patient received 4 hour premedication for contrast allergy. RADIATION DOSE REDUCTION: This exam was performed  according to the departmental dose-optimization program which includes automated exposure control, adjustment of the mA and/or kV according to patient size and/or use of iterative reconstruction technique. CONTRAST:  75mL OMNIPAQUE  IOHEXOL  350 MG/ML SOLN COMPARISON:  Radiograph earlier today. Chest CT 05/17/2023, high-resolution chest CT 08/01/2019 FINDINGS: Cardiovascular: There are no filling defects within the pulmonary arteries to suggest pulmonary embolus. Prior CABG. Dense calcification of native coronary arteries. The heart is mildly enlarged. Aortic atherosclerosis without acute aortic findings. No pericardial effusion. Mediastinum/Nodes: Stable shotty mediastinal lymph nodes, some of which are calcified. There are prominent bilateral hilar nodes. The esophagus is decompressed. Lungs/Pleura: Emphysema. Heterogeneous pulmonary parenchyma with areas of interspersed ground-glass. Overall low lung volumes with motion artifact. Assessment of bronchiectasis seen on prior is limited due to motion. No pleural effusion. Bowing of the central bronchi may represent bronchomalacia. No pulmonary mass. Upper Abdomen: No acute findings.  Gallstones. Musculoskeletal: Prior median sternotomy. There are no acute or suspicious osseous abnormalities. Review of the MIP images confirms the above findings. IMPRESSION: 1. No pulmonary embolus. 2. Heterogeneous pulmonary parenchyma with areas of interspersed ground-glass, likely hypoventilatory change superimposed on chronic interstitial lung disease. 3. Bowing of the central bronchi may represent bronchomalacia. 4. Cholelithiasis. Aortic Atherosclerosis (ICD10-I70.0) and Emphysema (  ICD10-J43.9). Electronically Signed   By: Andrea Gasman M.D.   On: 09/24/2023 20:23   DG Chest 1 View Result Date: 09/24/2023 CLINICAL DATA:  Chest pain EXAM: CHEST  1 VIEW COMPARISON:  04/03/2023 FINDINGS: Low volume film. Cardiopericardial silhouette is at upper limits of normal for size. There is  pulmonary vascular congestion without overt pulmonary edema. No pneumothorax or pleural effusion. Telemetry leads overlie the chest. IMPRESSION: Low volume film with pulmonary vascular congestion. Electronically Signed   By: Camellia Candle M.D.   On: 09/24/2023 09:44    ECHO 03/27/2023  1. Left ventricular ejection fraction, by estimation, is 40 to 45%. The left ventricle has normal function. The left ventricle demonstrates global hypokinesis. Left ventricular diastolic parameters are consistent with Grade III diastolic dysfunction (restrictive).   2. Right ventricular systolic function is normal. The right ventricular size is normal.   3. Left atrial size was moderately dilated.   4. Right atrial size was moderately dilated.   5. The mitral valve is normal in structure. Mild mitral valve regurgitation.   6. The aortic valve is normal in structure. Aortic valve regurgitation is not visualized.   TELEMETRY reviewed by me 09/25/2023: sinus rhythm, 1st degree AVB, rate 60s  EKG reviewed by me: Sinus rhythm, first-degree AVB, RBBB, PVC, rate 54 bpm  Data reviewed by me 09/25/2023: last 24h vitals tele labs imaging I/O hospitalist progress notes.  Principal Problem:   Unstable angina (HCC) Active Problems:   Coronary artery disease involving coronary bypass graft of native heart with angina pectoris (HCC)   PAF (paroxysmal atrial fibrillation) (HCC)   Anxiety   Pulmonary embolus (HCC)    ASSESSMENT AND PLAN:  Scott [name] is a 83 y.o. male  with a past medical history of coronary artery disease s/p multiple stents and CABG x 2 (LIMA to LAD, SVG to ramus-06/2024), ischemic cardiomyopathy, paroxysmal atrial fibrillation, history of pulmonary embolism, history of bradycardia who presented to the ED on 09/24/2023 for chest pain associated with SOB, lightheadedness. Patient states he is very active and doesn't have chest pain on exertion. Also states SL nitroglycerin  doesn't relieve his chest pain.  Cardiology was consulted for further evaluation for unstable angina.   # Unstable/chronic angina # CAD s/p multiple stents s/p CABG x 2 # Ischemic cardiomyopathy (EF 40-45%) # Paroxysmal atrial fibrillation # History of bradycardia Patient underwent LHC with Dr. Ammon on 09/02.  No intervention done, recommended continue medical therapy, maximizing antianginals and aggressive risk factor modification. - Ordered aspirin  81 mg daily.  - Continue home atorvastatin  80 mg, Zetia  10 mg daily. - Continue Eliquis  2.5 mg twice daily for stroke risk reduction. - Continue amlodipine  10 mg, losartan  25 mg daily. - Continue home Imdur  60 mg BID.  Uptitrate as BP allows. - Continue Ranexa  1000 mg twice daily. - Not on beta-blocker due to baseline bradycardia. - Plan for Dr. Katina (interventional cardiologist) to perform PCI to left main/LAD. Case discussed between Dr. Katina and Dr. Ammon (patients primary cardiologist). Discussed the risks and benefits of proceeding with LHC with PCI.  He is agreeable to proceed.  NPO after breakfast, until LHC with PCI (09/03 at [facility]). Written consent will be obtained.  Follow up in clinic with Dr. Ammon scheduled for 09/15.   This patient's plan of care was discussed and created with Dr. Florencio and he is in agreement.  Signed: Dorene Comfort, PA-C  09/25/2023, 12:44 PM [facility]

## 2023-09-25 NOTE — Care Management Obs Status (Signed)
 MEDICARE OBSERVATION STATUS NOTIFICATION   Patient Details  Name: Scott [name] MRN: [id number] Date of Birth: 01/30/1940   Medicare Observation Status Notification Given:  Yes    Lauraine JAYSON Carpen, LCSW 09/25/2023, 3:05 PM

## 2023-09-26 ENCOUNTER — Encounter
Admission: EM | Disposition: A | Discharge status: Home / Self Care | Payer: Self-pay | Source: Home / Self Care | Attending: Internal Medicine

## 2023-09-26 ENCOUNTER — Encounter: Payer: Self-pay | Admitting: Cardiology

## 2023-09-26 DIAGNOSIS — I480 Paroxysmal atrial fibrillation: Secondary | ICD-10-CM | POA: Diagnosis not present

## 2023-09-26 DIAGNOSIS — I25709 Atherosclerosis of coronary artery bypass graft(s), unspecified, with unspecified angina pectoris: Secondary | ICD-10-CM

## 2023-09-26 DIAGNOSIS — R001 Bradycardia, unspecified: Secondary | ICD-10-CM

## 2023-09-26 DIAGNOSIS — I200 Unstable angina: Secondary | ICD-10-CM

## 2023-09-26 DIAGNOSIS — I440 Atrioventricular block, first degree: Secondary | ICD-10-CM

## 2023-09-26 LAB — BASIC METABOLIC PANEL WITH GFR
Anion gap: 14 (ref 5–15)
BUN: 26 mg/dL — ABNORMAL HIGH (ref 8–23)
CO2: 24 mmol/L (ref 22–32)
Calcium: 8.7 mg/dL — ABNORMAL LOW (ref 8.9–10.3)
Chloride: 102 mmol/L (ref 98–111)
Creatinine, Ser: 1.23 mg/dL (ref 0.61–1.24)
GFR, Estimated: 59 mL/min — ABNORMAL LOW (ref >60)
Glucose, Bld: 127 mg/dL — ABNORMAL HIGH (ref 70–99)
Potassium: 4.7 mmol/L (ref 3.5–5.1)
Sodium: 140 mmol/L (ref 135–145)

## 2023-09-26 LAB — CBC
HCT: 44.5 % (ref 39.0–52.0)
Hemoglobin: 15 g/dL (ref 13.0–17.0)
MCH: 32.6 pg (ref 26.0–34.0)
MCHC: 33.7 g/dL (ref 30.0–36.0)
MCV: 96.7 fL (ref 80.0–100.0)
Platelets: 205 K/uL (ref 150–400)
RBC: 4.6 MIL/uL (ref 4.22–5.81)
RDW: 14.1 % (ref 11.5–15.5)
WBC: 14.3 K/uL — ABNORMAL HIGH (ref 4.0–10.5)
nRBC: 0 % (ref 0.0–0.2)

## 2023-09-26 SURGERY — CORONARY STENT INTERVENTION
Anesthesia: Moderate Sedation

## 2023-09-26 MED ORDER — FREE WATER: 500.0000 mL | Freq: Once | Status: DC

## 2023-09-26 MED ORDER — HEPARIN SODIUM (PORCINE) 1000 UNIT/ML IJ SOLN
INTRAMUSCULAR | Status: AC
  Filled 2023-09-26: qty 10

## 2023-09-26 MED ORDER — METHYLPREDNISOLONE SODIUM SUCC 125 MG IJ SOLR
INTRAMUSCULAR | Status: AC
  Filled 2023-09-26: qty 2

## 2023-09-26 MED ORDER — METHYLPREDNISOLONE SODIUM SUCC 125 MG IJ SOLR
125.0000 mg | Freq: Once | INTRAMUSCULAR | Status: AC
  Administered 2023-09-26: 125 mg via INTRAVENOUS

## 2023-09-26 MED ORDER — HYDRALAZINE HCL 20 MG/ML IJ SOLN: 10.0000 mg | INTRAMUSCULAR | Status: AC | PRN

## 2023-09-26 MED ORDER — FAMOTIDINE 20 MG PO TABS
ORAL_TABLET | ORAL | Status: AC
  Filled 2023-09-26: qty 2

## 2023-09-26 MED ORDER — DIPHENHYDRAMINE HCL 50 MG/ML IJ SOLN
50.0000 mg | Freq: Once | INTRAMUSCULAR | Status: AC
  Administered 2023-09-26: 50 mg via INTRAVENOUS

## 2023-09-26 MED ORDER — SODIUM CHLORIDE 0.9% FLUSH
3.0000 mL | Freq: Two times a day (BID) | INTRAVENOUS | Status: DC
  Administered 2023-09-26 – 2023-09-27 (×2): 3 mL via INTRAVENOUS

## 2023-09-26 MED ORDER — LIDOCAINE HCL 1 % IJ SOLN
INTRAMUSCULAR | Status: AC
  Filled 2023-09-26: qty 20

## 2023-09-26 MED ORDER — MIDAZOLAM HCL 2 MG/2ML IJ SOLN
INTRAMUSCULAR | Status: DC | PRN
Start: 2023-09-26 — End: 2023-09-26
  Administered 2023-09-26: 1 mg via INTRAVENOUS

## 2023-09-26 MED ORDER — ASPIRIN 81 MG PO CHEW
81.0000 mg | CHEWABLE_TABLET | ORAL | Status: AC
  Administered 2023-09-26: 81 mg via ORAL
  Filled 2023-09-26: qty 1

## 2023-09-26 MED ORDER — MIDAZOLAM HCL 2 MG/2ML IJ SOLN
INTRAMUSCULAR | Status: AC
  Filled 2023-09-26: qty 2

## 2023-09-26 MED ORDER — FENTANYL CITRATE (PF) 100 MCG/2ML IJ SOLN
INTRAMUSCULAR | Status: AC
Start: 2023-09-26 — End: 2023-09-26
  Filled 2023-09-26: qty 2

## 2023-09-26 MED ORDER — VERAPAMIL HCL 2.5 MG/ML IV SOLN
INTRAVENOUS | Status: AC
  Filled 2023-09-26: qty 2

## 2023-09-26 MED ORDER — SODIUM CHLORIDE 0.9% FLUSH: 3.0000 mL | INTRAVENOUS | Status: DC | PRN

## 2023-09-26 MED ORDER — FENTANYL CITRATE (PF) 100 MCG/2ML IJ SOLN
INTRAMUSCULAR | Status: DC | PRN
Start: 2023-09-26 — End: 2023-09-26
  Administered 2023-09-26: 50 ug via INTRAVENOUS

## 2023-09-26 MED ORDER — FAMOTIDINE IN NACL 20-0.9 MG/50ML-% IV SOLN
20.0000 mg | Freq: Once | INTRAVENOUS | Status: AC
  Administered 2023-09-26: 20 mg via INTRAVENOUS
  Filled 2023-09-26: qty 50

## 2023-09-26 MED ORDER — LIDOCAINE HCL (PF) 1 % IJ SOLN
INTRAMUSCULAR | Status: DC | PRN
  Administered 2023-09-26: 5 mL

## 2023-09-26 MED ORDER — ASPIRIN 81 MG PO CHEW: 81.0000 mg | CHEWABLE_TABLET | ORAL | Status: DC

## 2023-09-26 MED ORDER — VERAPAMIL HCL 2.5 MG/ML IV SOLN
INTRAVENOUS | Status: DC | PRN
  Administered 2023-09-26: 2.5 mg via INTRA_ARTERIAL

## 2023-09-26 MED ORDER — SODIUM CHLORIDE 0.9 % IV SOLN: 250.0000 mL | INTRAVENOUS | Status: AC | PRN

## 2023-09-26 MED ORDER — DIPHENHYDRAMINE HCL 50 MG/ML IJ SOLN
INTRAMUSCULAR | Status: AC
  Filled 2023-09-26: qty 1

## 2023-09-26 MED ORDER — HEPARIN SODIUM (PORCINE) 1000 UNIT/ML IJ SOLN
INTRAMUSCULAR | Status: DC | PRN
  Administered 2023-09-26: 8000 [IU] via INTRAVENOUS

## 2023-09-26 MED ORDER — IOHEXOL 300 MG/ML  SOLN
INTRAMUSCULAR | Status: DC | PRN
Start: 2023-09-26 — End: 2023-09-26
  Administered 2023-09-26: 25 mL

## 2023-09-26 SURGICAL SUPPLY — 13 items
CATH VISTA GUIDE 6FR JL3.5 MPK (CATHETERS) IMPLANT
DEVICE RAD TR BAND REGULAR (VASCULAR PRODUCTS) IMPLANT
DRAPE BRACHIAL (DRAPES) IMPLANT
GLIDESHEATH SLEND A-KIT 6F 22G (SHEATH) IMPLANT
GUIDEWIRE INQWIRE 1.5J.035X260 (WIRE) IMPLANT
GUIDEWIRE PRESSURE X 175 (WIRE) IMPLANT
KIT SYRINGE INJ CVI SPIKEX1 (MISCELLANEOUS) IMPLANT
PACK CARDIAC CATH (CUSTOM PROCEDURE TRAY) ×1 IMPLANT
SET ATX-X65L (MISCELLANEOUS) IMPLANT
STATION PROTECTION PRESSURIZED (MISCELLANEOUS) IMPLANT
TUBING CIL FLEX 10 FLL-RA (TUBING) IMPLANT
VALVE COPILOT STAT (MISCELLANEOUS) IMPLANT
WIRE NITINOL .018 (WIRE) IMPLANT

## 2023-09-26 NOTE — Progress Notes (Signed)
 JUDITHANN Comfort, PA-C with Dr. Katina in at bedside, speaking with pt. , his 2 daughters and wife re: cath results. All family members verbalized understanding of conversation with PA

## 2023-09-26 NOTE — TOC CM/SW Note (Signed)
 Transition of Care [facility]) - Inpatient Brief Assessment   Patient Details  Name: Scott [name] MRN: [id number] Date of Birth: [birth year]  Transition of Care [facility]) CM/SW Contact:    Lauraine JAYSON Carpen, LCSW Phone Number: 09/26/2023, 10:58 AM   Clinical Narrative: CSW reviewed chart. No TOC needs identified so far. CSW will continue to follow progress. Please place [facility] consult if any needs arise.  Transition of Care Asessment: Insurance and Status: Insurance coverage has been reviewed Patient has primary care physician: Yes Home environment has been reviewed: Single family home Prior level of function:: Not documented Prior/Current Home Services: No current home services Social Drivers of Health Review: SDOH reviewed no interventions necessary Readmission risk has been reviewed: Yes Transition of care needs: no transition of care needs at this time

## 2023-09-26 NOTE — Progress Notes (Signed)
  Progress Note   Patient: Scott [name] FMW:[id number] DOB: January 07, 1941 DOA: 09/24/2023     2 DOS: the patient was seen and examined on 09/26/2023   Brief hospital course: RIGDON MACOMBER is a pleasant 83 y.o. male with medical history significant for CAD s/p CABG and multiple PCI's, atrial fibrillation on Eliquis , HLD, HTN, history of pulmonary embolism who came into the hospital with ongoing chest pain.  She has normal troponin.  Patient was diagnosed with unstable angina, heart catheter was performed on 9/2, showed 80% stenosis in circumflex, and 100% stenosis second marginal branch. Patient is receiving high risk intervention with stent placement 9/3   Principal Problem:   Unstable angina [facility]) Active Problems:   Coronary artery disease involving coronary bypass graft of native heart with angina pectoris (HCC)   PAF (paroxysmal atrial fibrillation) (HCC)   Anxiety   Pulmonary embolus (HCC)   Assessment and Plan: Coronary artery disease with unstable angina. Cardiac cath results performed on 9/2 as above. - meds per cardiology, currently on atorva, apixaban , amlod, imdur , ranexa  Patient is going to Cath Lab for high risk stent placement today.  Likely discharge home tomorrow after successful intervention.   Paroxysmal atrial fibrillation Not on BB due to baseline bradycardia - cont home apixaban    # History PE CTA no PE - cont home apixaban    # HTN BPs controlled - meds as above      Subjective:  Patient still has some chest pain, mild.  No shortness of breath.  Physical Exam: Vitals:   09/26/23 0126 09/26/23 0721 09/26/23 1250 09/26/23 1420  BP:  (!) 122/59 117/84   Pulse:  60 (!) 58   Resp:  17 19   Temp:  98.7 F (37.1 C) 97.8 F (36.6 C)   TempSrc:   Tympanic   SpO2:  100% 100% 100%  Weight: 75.8 kg     Height:       General exam: Appears calm and comfortable  Respiratory system: Clear to auscultation. Respiratory effort normal. Cardiovascular system: S1 &  S2 heard, RRR. No JVD, murmurs, rubs, gallops or clicks. No pedal edema. Gastrointestinal system: Abdomen is nondistended, soft and nontender. No organomegaly or masses felt. Normal bowel sounds heard. Central nervous system: Alert and oriented. No focal neurological deficits. Extremities: Symmetric 5 x 5 power. Skin: No rashes, lesions or ulcers Psychiatry: Judgement and insight appear normal. Mood & affect appropriate.    Data Reviewed:  Reviewed lab results  Family Communication: Wife updated at bedside.  Disposition: Status is: Inpatient Remains inpatient appropriate because: Severity of disease, inpatient procedure     Time spent: 35 minutes  Author: Murvin Mana, MD 09/26/2023 2:49 PM  For on call review [email or web address].

## 2023-09-26 NOTE — Plan of Care (Signed)

## 2023-09-26 NOTE — Plan of Care (Signed)

## 2023-09-26 NOTE — Care Management Important Message (Signed)
 Important Message  Patient Details  Name: Scott [name] MRN: [id number] Date of Birth: Jun 04, 1940   Important Message Given:  Yes - Medicare IM     Rojelio SHAUNNA Rattler 09/26/2023, 11:56 AM

## 2023-09-26 NOTE — Progress Notes (Signed)
 [facility] CLINIC CARDIOLOGY PROGRESS NOTE       Patient ID: Scott [name] MRN: [id number] DOB/AGE: 01-Aug-1940 83 y.o.  Admit date: 09/24/2023 Referring Physician Dr. Roann Primary Physician Rudolpho Norleen BIRCH, MD Primary Cardiologist Dr. Ammon Reason for Consultation chest pain  HPI: Scott [name] is a 83 y.o. male  with a past medical history of coronary artery disease s/p multiple stents and CABG x 2 (LIMA to LAD, SVG to ramus-06/2014), ischemic cardiomyopathy, paroxysmal atrial fibrillation, history of pulmonary embolism, history of bradycardia who presented to the ED on 09/24/2023 for chest pain associated with SOB, lightheadedness. Patient states he is very active and doesn't have chest pain on exertion. Also states SL nitroglycerin  doesn't relieve his chest pain. Cardiology was consulted for further evaluation for unstable angina.   Interval History: -Patient seen and examined this AM and laying comfortably in hospital bed. Patient denies any more recurrence of chest pain. -Patients BP and HR stable this afternoon. Overnight Tele showed no significant events.  -Patient remains on room air with stable SpO2.  -Plan for coronary stent intervention this afternoon.  Patient has remained NPO.  LHC (09/25/2023)   Ost Cx to Prox Cx lesion is 80% stenosed.   2nd Mrg lesion is 100% stenosed.   Prox LAD lesion is 50% stenosed.   Ost LM to Mid LM lesion is 40% stenosed.   Non-stenotic Prox LAD to Mid LAD lesion was previously treated.   Non-stenotic Ost RCA to Mid RCA lesion was previously treated.   Non-stenotic Mid RCA lesion was previously treated.   There is mild left ventricular systolic dysfunction.   LV end diastolic pressure is normal.   The left ventricular ejection fraction is 35-45% by visual estimate.   1.  Three-vessel coronary artery disease with patent stent proximal/mid LAD, diffuse 80% stenosis ostial/proximal very small caliber left circumflex, patent stent proximal and  mid RCA, with atretic but patent LIMA to distal LAD, and patent SVG to ramus intermedius branch, overall appears unchanged compared to prior cardiac catheterization 05/25/2020 2.  Mildly reduced left ventricular function with anterior apical hypokinesis, estimate LVEF 40%   Review of systems complete and found to be negative unless listed above    Past Medical History:  Diagnosis Date   Arthritis    right hip, since a fall   CAD (coronary artery disease)    a. 04/2012 Cath: 3VD->Med Rx;  b. 04/2013 PCI RCA (2 DES); c. 03/2014 PCI: LAD 80 (3.0x23 Xience Alpine DES); d. 06/2014 CABG x 2 ([facility]) LIMA->LAD, VG->OM; e. 12/2014 Cath ([facility]): patent grafts->Med Rx; f. 08/2015 Cath: patent grafts; g. 11/2016 Cath: LM min irregs, LAD 20p, 13m, LCX 100p/m, RCA 20ost, 10p/m/d, VG->OM1 nl, LIMA->dLAD nl; f. 07/2017 MV: No isch, EF 51%.   Cardiomyopathy, ischemic    a. 04/2012 Echo: EF 40-45%;  b. 08/2013 Echo: EF 45-50%; c. 01/2015 Echo: EF 40-45%; d. 07/2016 Echo: EF 60-65%; e. 09/2017 Echo: EF 55-60%, Gr1 DD.   Carotid arterial disease (HCC)    a. 05/2013 Carotid U/S; bilat 40-50% ICA stenosis.   Chronic Chest Pain    USES NITRO   Chronic systolic CHF (congestive heart failure) (HCC)    a. 01/2015 Echo: EF 40-45%; b. 07/2016 Echo: EF 60-65%; c. 09/2017 Echo: EF 55-60%, Gr1 DD, nl RV fxn.   Cough    Diverticulosis    Dizziness    Dyspnea    WITH EXERTION   Headache 1970's   migraine   History of blood transfusion  2016   post op   Hyperlipidemia    Hypertension    Iron  deficiency anemia due to chronic blood loss 09/11/2016   Myocardial infarction [facility])    unsure of when   Pulmonary emboli (HCC) 07/2016   a. On Xarelto .   Reflux esophagitis    Sternal pain    a. 03/2016 s/p Sternal wire removal; b. 05/2016 s/p redo median sternotomy for sternal debridement and sternal plating.   Syncope 04/2018   CARDIOLOGIST WAS DR PERLA AND HE WAS AWARE-NOW PT SEES PARASCHOS   Tubular adenoma of colon     Past Surgical  History:  Procedure Laterality Date   CARDIAC CATHETERIZATION  05/01/2012   CARDIAC CATHETERIZATION  04/2013   [facility];x3 stent   CARDIAC CATHETERIZATION  01/11/15    [facility]   CARDIAC CATHETERIZATION N/A 09/16/2015   Procedure: LEFT HEART CATH AND CORS/GRAFTS ANGIOGRAPHY;  Surgeon: Evalene JINNY PERLA, MD;  Location: [facility] INVASIVE CV LAB;  Service: Cardiovascular;  Laterality: N/A;   CARPAL TUNNEL RELEASE     right hand   CATARACT EXTRACTION     LEFT   CATARACT EXTRACTION W/PHACO Left 09/16/2014   Procedure: CATARACT EXTRACTION PHACO AND INTRAOCULAR LENS PLACEMENT (IOC);  Surgeon: Dene Etienne, MD;  Location: [facility]GERY [facility];  Service: Ophthalmology;  Laterality: Left;   COLONOSCOPY     COLONOSCOPY WITH PROPOFOL  N/A 01/28/2016   Procedure: COLONOSCOPY WITH PROPOFOL ;  Surgeon: Gladis RAYMOND Mariner, MD;  Location: [facility] ENDOSCOPY;  Service: Endoscopy;  Laterality: N/A;   COLONOSCOPY WITH PROPOFOL  N/A 08/23/2021   Procedure: COLONOSCOPY WITH PROPOFOL ;  Surgeon: Unk Corinn Skiff, MD;  Location: [facility] ENDOSCOPY;  Service: Gastroenterology;  Laterality: N/A;  REQUESTS 9AM OR LATER   CORONARY ANGIOPLASTY WITH STENT PLACEMENT  04/13/2014   CORONARY ARTERY BYPASS GRAFT  06-26-14   x3 bypasses   CORONARY PRESSURE/FFR WITH 3D MAPPING N/A 12/10/2018   Procedure: KEN PRESSURE WIRE/FFR STUDY;  Surgeon: Ammon Blunt, MD;  Location: [facility] INVASIVE CV LAB;  Service: Cardiovascular;  Laterality: N/A;   CORONARY ULTRASOUND/IVUS N/A 12/10/2018   Procedure: Intravascular Ultrasound/IVUS;  Surgeon: Ammon Blunt, MD;  Location: [facility] INVASIVE CV LAB;  Service: Cardiovascular;  Laterality: N/A;   CYSTOSCOPY N/A 08/14/2018   Procedure: CYSTOSCOPY;  Surgeon: Twylla Glendia BROCKS, MD;  Location: [facility] ORS;  Service: Urology;  Laterality: N/A;   DE QUERVAIN'S RELEASE Left 08/22/2012   ESOPHAGOGASTRODUODENOSCOPY (EGD) WITH PROPOFOL  N/A 04/26/2015   Procedure: ESOPHAGOGASTRODUODENOSCOPY (EGD) WITH PROPOFOL ;   Surgeon: Deward CINDERELLA Piedmont, MD;  Location: [facility] ENDOSCOPY;  Service: Gastroenterology;  Laterality: N/A;   ESOPHAGOGASTRODUODENOSCOPY (EGD) WITH PROPOFOL  N/A 05/29/2017   Procedure: ESOPHAGOGASTRODUODENOSCOPY (EGD) WITH PROPOFOL ;  Surgeon: Mariner Gladis RAYMOND, MD;  Location: [facility] ENDOSCOPY;  Service: Endoscopy;  Laterality: N/A;   LEFT HEART CATH AND CORONARY ANGIOGRAPHY N/A 12/04/2016   Procedure: LEFT HEART CATH AND CORS/GRAFTS  ANGIOGRAPHY;  Surgeon: Darron Deatrice LABOR, MD;  Location: [facility] INVASIVE CV LAB;  Service: Cardiovascular;  Laterality: N/A;   LEFT HEART CATH AND CORS/GRAFTS ANGIOGRAPHY N/A 04/09/2018   Procedure: LEFT HEART CATH AND CORS/GRAFTS ANGIOGRAPHY;  Surgeon: Ammon Blunt, MD;  Location: [facility] INVASIVE CV LAB;  Service: Cardiovascular;  Laterality: N/A;   LEFT HEART CATH AND CORS/GRAFTS ANGIOGRAPHY N/A 12/10/2018   Procedure: LEFT HEART CATH AND CORS/GRAFTS ANGIOGRAPHY;  Surgeon: Ammon Blunt, MD;  Location: [facility] INVASIVE CV LAB;  Service: Cardiovascular;  Laterality: N/A;   LEFT HEART CATH AND CORS/GRAFTS ANGIOGRAPHY Left 05/25/2020   Procedure: LEFT HEART CATH AND CORS/GRAFTS ANGIOGRAPHY;  Surgeon:  Ammon Blunt, MD;  Location: [facility] INVASIVE CV LAB;  Service: Cardiovascular;  Laterality: Left;   RIB PLATING N/A 05/25/2016   Procedure: STERNAL PLATING;  Surgeon: Elspeth JAYSON Millers, MD;  Location: [facility] OR;  Service: Thoracic;  Laterality: N/A;   right shoulder     STERNAL WIRES REMOVAL N/A 03/30/2016   Procedure: STERNAL WIRES REMOVAL;  Surgeon: Elspeth JAYSON Millers, MD;  Location: [facility] OR;  Service: Thoracic;  Laterality: N/A;   TONSILLECTOMY      Medications Prior to Admission  Medication Sig Dispense Refill Last Dose/Taking   atorvastatin  (LIPITOR ) 80 MG tablet Take 1 tablet (80 mg total) by mouth daily. 30 tablet 0 09/23/2023   clindamycin (CLEOCIN T) 1 % external solution Apply topically.   09/23/2023   ELIQUIS  2.5 MG TABS tablet Take 1 tablet (2.5 mg total) by mouth 2  (two) times daily. 60 tablet 0 09/24/2023 at  8:00 AM   ezetimibe  (ZETIA ) 10 MG tablet Take 1 tablet (10 mg total) by mouth daily. 30 tablet 0 09/23/2023   isosorbide  mononitrate (IMDUR ) 60 MG 24 hr tablet Take 1 tablet (60 mg total) by mouth 2 (two) times daily. 60 tablet 0 09/24/2023 Morning   nitroGLYCERIN  (NITROSTAT ) 0.4 MG SL tablet Place 1 tablet (0.4 mg total) under the tongue every 5 (five) minutes as needed for chest pain. 30 tablet 0 Unknown   ranolazine  (RANEXA ) 1000 MG SR tablet Take 1 tablet (1,000 mg total) by mouth 2 (two) times daily. 60 tablet 0 09/24/2023 Morning   carbamide peroxide (DEBROX) 6.5 % OTIC solution Place 5 drops into both ears 2 (two) times daily. (Patient not taking: Reported on 04/12/2023) 15 mL 0    diclofenac  Sodium (VOLTAREN ) 1 % GEL Apply 2 g topically 4 (four) times daily. (Patient not taking: Reported on 04/12/2023) 100 g 0    Ipratropium-Albuterol  (COMBIVENT) 20-100 MCG/ACT AERS respimat Inhale 2 puffs into the lungs 4 (four) times daily as needed for wheezing. (Patient not taking: Reported on 09/24/2023)   Not Taking   levalbuterol (XOPENEX) 0.63 MG/3ML nebulizer solution Inhale into the lungs. (Patient not taking: Reported on 04/12/2023)      midodrine  (PROAMATINE ) 10 MG tablet Take 1 tablet (10 mg total) by mouth 3 (three) times daily with meals. (Patient not taking: Reported on 09/24/2023) 90 tablet 0 Not Taking   OHTUVAYRE  3 MG/2.5ML SUSP  (Patient not taking: Reported on 09/24/2023)   Not Taking   senna-docusate (SENOKOT-S) 8.6-50 MG tablet Take 1 tablet by mouth at bedtime as needed for mild constipation. (Patient not taking: Reported on 09/24/2023) 30 tablet 0 Not Taking   Social History   Socioeconomic History   Marital status: Married    Spouse name: Not on file   Number of children: Not on file   Years of education: Not on file   Highest education level: Not on file  Occupational History   Not on file  Tobacco Use   Smoking status: Former    Current  packs/day: 0.00    Average packs/day: 1 pack/day for 35.0 years (35.0 ttl pk-yrs)    Types: Cigarettes    Start date: 56    Quit date: 2009    Years since quitting: 16.6   Smokeless tobacco: Never  Vaping Use   Vaping status: Never Used  Substance and Sexual Activity   Alcohol use: No   Drug use: No   Sexual activity: Yes  Other Topics Concern   Not on file  Social History Narrative  Not on file   Social Drivers of Health   Financial Resource Strain: Low Risk  (08/14/2023)   Received from [facility]   Overall Financial Resource Strain (CARDIA)    Difficulty of Paying Living Expenses: Not hard at all  Food Insecurity: No Food Insecurity (09/24/2023)   Hunger Vital Sign    Worried About Running Out of Food in the Last Year: Never true    Ran Out of Food in the Last Year: Never true  Transportation Needs: No Transportation Needs (09/24/2023)   PRAPARE - Administrator, Civil Service (Medical): No    Lack of Transportation (Non-Medical): No  Physical Activity: Not on file  Stress: Not on file  Social Connections: Moderately Integrated (09/24/2023)   Social Connection and Isolation Panel    Frequency of Communication with Friends and Family: Three times a week    Frequency of Social Gatherings with Friends and Family: More than three times a week    Attends Religious Services: More than 4 times per year    Active Member of Clubs or Organizations: No    Attends Banker Meetings: Patient declined    Marital Status: Married  Catering manager Violence: Not At Risk (09/24/2023)   Humiliation, Afraid, Rape, and Kick questionnaire    Fear of Current or Ex-Partner: No    Emotionally Abused: No    Physically Abused: No    Sexually Abused: No    Family History  Problem Relation Age of Onset   Heart attack Father 31       MI   Cancer Maternal Uncle      Vitals:   09/25/23 2012 09/25/23 2344 09/26/23 0126 09/26/23 0721  BP: (!) 107/55 (!)  100/48  (!) 122/59  Pulse: 66 62  60  Resp: 20 19  17   Temp: 97.6 F (36.4 C) 97.9 F (36.6 C)  98.7 F (37.1 C)  TempSrc:      SpO2: 98% 98%  100%  Weight:   75.8 kg   Height:        PHYSICAL EXAM General: Well-appearing elderly male, well nourished, in no acute distress. HEENT: Normocephalic and atraumatic. Neck: No JVD.   Lungs: Normal respiratory effort on room air. Clear bilaterally to auscultation. No wheezes, crackles, rhonchi.  Heart: HRRR. Normal S1 and S2 without gallops or murmurs.  Abdomen: Non-distended appearing.  Msk: Normal strength and tone for age. Extremities: Warm and well perfused. No clubbing, cyanosis, edema.  Neuro: Alert and oriented X 3. Psych: Answers questions appropriately.   Labs: Basic Metabolic Panel: Recent Labs    09/25/23 0520 09/26/23 0500  NA 137 140  K 4.1 4.7  CL 104 102  CO2 26 24  GLUCOSE 148* 127*  BUN 20 26*  CREATININE 1.07 1.23  CALCIUM  8.4* 8.7*   Liver Function Tests: Recent Labs    09/24/23 0950 09/25/23 0520  AST 21 24  ALT 15 14  ALKPHOS 38 39  BILITOT 0.6 0.8  PROT 6.3* 6.8  ALBUMIN  3.1* 3.3*   No results for input(s): LIPASE, AMYLASE in the last 72 hours. CBC: Recent Labs    09/24/23 0950 09/25/23 0520 09/26/23 0500  WBC 5.3 9.7 14.3*  NEUTROABS 2.8  --   --   HGB 14.7 14.6 15.0  HCT 44.2 44.6 44.5  MCV 97.4 97.6 96.7  PLT 200 191 205   Cardiac Enzymes: Recent Labs    09/24/23 1249 09/24/23 1450 09/24/23 1637  TROPONINIHS 15  14 13   BNP: Recent Labs    09/24/23 0950  BNP 124.6*   D-Dimer: Recent Labs    09/24/23 0950  DDIMER 0.55*   Hemoglobin A1C: No results for input(s): HGBA1C in the last 72 hours. Fasting Lipid Panel: No results for input(s): CHOL, HDL, LDLCALC, TRIG, CHOLHDL, LDLDIRECT in the last 72 hours. Thyroid  Function Tests: No results for input(s): TSH, T4TOTAL, T3FREE, THYROIDAB in the last 72 hours.  Invalid input(s): FREET3 Anemia  Panel: No results for input(s): VITAMINB12, FOLATE, FERRITIN, TIBC, IRON , RETICCTPCT in the last 72 hours.   Radiology: CARDIAC CATHETERIZATION Result Date: 09/25/2023   Ost Cx to Prox Cx lesion is 80% stenosed.   2nd Mrg lesion is 100% stenosed.   Prox LAD lesion is 50% stenosed.   Ost LM to Mid LM lesion is 40% stenosed.   Non-stenotic Prox LAD to Mid LAD lesion was previously treated.   Non-stenotic Ost RCA to Mid RCA lesion was previously treated.   Non-stenotic Mid RCA lesion was previously treated.   There is mild left ventricular systolic dysfunction.   LV end diastolic pressure is normal.   The left ventricular ejection fraction is 35-45% by visual estimate. 1.  Three-vessel coronary artery disease with patent stent proximal/mid LAD, diffuse 80% stenosis ostial/proximal very small caliber left circumflex, patent stent proximal and mid RCA, with atretic but patent LIMA to distal LAD, and patent SVG to ramus intermedius branch, overall appears unchanged compared to prior cardiac catheterization 05/25/2020 2.  Mildly reduced left ventricular function with anterior apical hypokinesis, estimate LVEF 40% Recommendations 1.  Continue medical therapy 2.  Maximize antianginals 3.  Aggressive risk factor modification 4.  May discharge home later today   CT Angio Chest Pulmonary Embolism (PE) W or WO Contrast Result Date: 09/24/2023 CLINICAL DATA:  Pulmonary embolism (PE) suspected, high prob Chest pain. EXAM: CT ANGIOGRAPHY CHEST WITH CONTRAST TECHNIQUE: Multidetector CT imaging of the chest was performed using the standard protocol during bolus administration of intravenous contrast. Multiplanar CT image reconstructions and MIPs were obtained to evaluate the vascular anatomy. Patient received 4 hour premedication for contrast allergy. RADIATION DOSE REDUCTION: This exam was performed according to the departmental dose-optimization program which includes automated exposure control, adjustment of the mA  and/or kV according to patient size and/or use of iterative reconstruction technique. CONTRAST:  75mL OMNIPAQUE  IOHEXOL  350 MG/ML SOLN COMPARISON:  Radiograph earlier today. Chest CT 05/17/2023, high-resolution chest CT 08/01/2019 FINDINGS: Cardiovascular: There are no filling defects within the pulmonary arteries to suggest pulmonary embolus. Prior CABG. Dense calcification of native coronary arteries. The heart is mildly enlarged. Aortic atherosclerosis without acute aortic findings. No pericardial effusion. Mediastinum/Nodes: Stable shotty mediastinal lymph nodes, some of which are calcified. There are prominent bilateral hilar nodes. The esophagus is decompressed. Lungs/Pleura: Emphysema. Heterogeneous pulmonary parenchyma with areas of interspersed ground-glass. Overall low lung volumes with motion artifact. Assessment of bronchiectasis seen on prior is limited due to motion. No pleural effusion. Bowing of the central bronchi may represent bronchomalacia. No pulmonary mass. Upper Abdomen: No acute findings.  Gallstones. Musculoskeletal: Prior median sternotomy. There are no acute or suspicious osseous abnormalities. Review of the MIP images confirms the above findings. IMPRESSION: 1. No pulmonary embolus. 2. Heterogeneous pulmonary parenchyma with areas of interspersed ground-glass, likely hypoventilatory change superimposed on chronic interstitial lung disease. 3. Bowing of the central bronchi may represent bronchomalacia. 4. Cholelithiasis. Aortic Atherosclerosis (ICD10-I70.0) and Emphysema (ICD10-J43.9). Electronically Signed   By: Andrea Gasman M.D.   On:  09/24/2023 20:23   DG Chest 1 View Result Date: 09/24/2023 CLINICAL DATA:  Chest pain EXAM: CHEST  1 VIEW COMPARISON:  04/03/2023 FINDINGS: Low volume film. Cardiopericardial silhouette is at upper limits of normal for size. There is pulmonary vascular congestion without overt pulmonary edema. No pneumothorax or pleural effusion. Telemetry leads  overlie the chest. IMPRESSION: Low volume film with pulmonary vascular congestion. Electronically Signed   By: Camellia Candle M.D.   On: 09/24/2023 09:44    ECHO 03/27/2023  1. Left ventricular ejection fraction, by estimation, is 40 to 45%. The left ventricle has normal function. The left ventricle demonstrates global hypokinesis. Left ventricular diastolic parameters are consistent with Grade III diastolic dysfunction (restrictive).   2. Right ventricular systolic function is normal. The right ventricular size is normal.   3. Left atrial size was moderately dilated.   4. Right atrial size was moderately dilated.   5. The mitral valve is normal in structure. Mild mitral valve regurgitation.   6. The aortic valve is normal in structure. Aortic valve regurgitation is not visualized.   TELEMETRY reviewed by me 09/26/2023: sinus rhythm, 1st degree AVB, rate 60s  EKG reviewed by me: Sinus rhythm, first-degree AVB, RBBB, PVC, rate 54 bpm  Data reviewed by me 09/26/2023: last 24h vitals tele labs imaging I/O hospitalist progress notes.  Principal Problem:   Unstable angina (HCC) Active Problems:   Coronary artery disease involving coronary bypass graft of native heart with angina pectoris (HCC)   PAF (paroxysmal atrial fibrillation) (HCC)   Anxiety   Pulmonary embolus (HCC)    ASSESSMENT AND PLAN:  DAIVD FREDERICKSEN is a 83 y.o. male  with a past medical history of coronary artery disease s/p multiple stents and CABG x 2 (LIMA to LAD, SVG to ramus-06/2024), ischemic cardiomyopathy, paroxysmal atrial fibrillation, history of pulmonary embolism, history of bradycardia who presented to the ED on 09/24/2023 for chest pain associated with SOB, lightheadedness. Patient states he is very active and doesn't have chest pain on exertion. Also states SL nitroglycerin  doesn't relieve his chest pain. Cardiology was consulted for further evaluation for unstable angina.   # Unstable/chronic angina # CAD s/p multiple  stents s/p CABG x 2 # Ischemic cardiomyopathy (EF 40-45%) # Paroxysmal atrial fibrillation # History of bradycardia Patient underwent LHC with Dr. Ammon on 09/02.  No intervention done, recommended continue medical therapy, maximizing antianginals and aggressive risk factor modification. - Continue aspirin  81 mg daily.  - Continue home atorvastatin  80 mg, Zetia  10 mg daily. - Continue Eliquis  2.5 mg twice daily for stroke risk reduction. - Continue amlodipine  10 mg, losartan  25 mg daily. - Continue home Imdur  60 mg BID.  Uptitrate as BP allows. - Continue Ranexa  1000 mg twice daily. - Not on beta-blocker due to baseline bradycardia. - Plan for Dr. Katina (interventional cardiologist) to perform PCI to left main/LAD. Case discussed between Dr. Katina and Dr. Ammon (patients primary cardiologist). Discussed the risks and benefits of proceeding with LHC with PCI.  He is agreeable to proceed.  NPO until LHC with PCI (09/03 at [facility]). Written consent will be obtained.  Follow up in clinic with Dr. Ammon scheduled for 09/15.   This patient's plan of care was discussed and created with Dr. Florencio and he is in agreement.  Signed: Dorene Comfort, PA-C  09/26/2023, 11:40 AM [facility]

## 2023-09-26 NOTE — Hospital Course (Signed)
 Scott [name] is a pleasant 83 y.o. male with medical history significant for CAD s/p CABG and multiple PCI's, atrial fibrillation on Eliquis , HLD, HTN, history of pulmonary embolism who came into the hospital with ongoing chest pain.  She has normal troponin.  Patient was diagnosed with unstable angina, heart catheter was performed on 9/2, showed 80% stenosis in circumflex, and 100% stenosis second marginal branch. Patient is receiving high risk intervention with stent placement 9/3

## 2023-09-27 ENCOUNTER — Encounter: Payer: Self-pay | Admitting: Oncology

## 2023-09-27 ENCOUNTER — Other Ambulatory Visit: Payer: Self-pay

## 2023-09-27 DIAGNOSIS — I480 Paroxysmal atrial fibrillation: Secondary | ICD-10-CM | POA: Diagnosis not present

## 2023-09-27 DIAGNOSIS — I200 Unstable angina: Secondary | ICD-10-CM | POA: Diagnosis not present

## 2023-09-27 LAB — BASIC METABOLIC PANEL WITH GFR
Anion gap: 6 (ref 5–15)
Anion gap: 9 (ref 5–15)
BUN: 40 mg/dL — ABNORMAL HIGH (ref 8–23)
BUN: 38 mg/dL — ABNORMAL HIGH (ref 8–23)
CO2: 26 mmol/L (ref 22–32)
CO2: 27 mmol/L (ref 22–32)
Calcium: 8.2 mg/dL — ABNORMAL LOW (ref 8.9–10.3)
Calcium: 8.4 mg/dL — ABNORMAL LOW (ref 8.9–10.3)
Chloride: 105 mmol/L (ref 98–111)
Chloride: 102 mmol/L (ref 98–111)
Creatinine, Ser: 1.39 mg/dL — ABNORMAL HIGH (ref 0.61–1.24)
Creatinine, Ser: 1.19 mg/dL (ref 0.61–1.24)
GFR, Estimated: 51 mL/min — ABNORMAL LOW (ref >60)
GFR, Estimated: >60 mL/min (ref >60)
Glucose, Bld: 158 mg/dL — ABNORMAL HIGH (ref 70–99)
Glucose, Bld: 180 mg/dL — ABNORMAL HIGH (ref 70–99)
Potassium: 5 mmol/L (ref 3.5–5.1)
Potassium: 4.6 mmol/L (ref 3.5–5.1)
Sodium: 137 mmol/L (ref 135–145)
Sodium: 138 mmol/L (ref 135–145)

## 2023-09-27 LAB — CBC
HCT: 40.5 % (ref 39.0–52.0)
Hemoglobin: 13.8 g/dL (ref 13.0–17.0)
MCH: 32.9 pg (ref 26.0–34.0)
MCHC: 34.1 g/dL (ref 30.0–36.0)
MCV: 96.7 fL (ref 80.0–100.0)
Platelets: 192 K/uL (ref 150–400)
RBC: 4.19 MIL/uL — ABNORMAL LOW (ref 4.22–5.81)
RDW: 14.3 % (ref 11.5–15.5)
WBC: 11.3 K/uL — ABNORMAL HIGH (ref 4.0–10.5)
nRBC: 0 % (ref 0.0–0.2)

## 2023-09-27 LAB — POCT ACTIVATED CLOTTING TIME: Activated Clotting Time: 279 s

## 2023-09-27 LAB — LIPOPROTEIN A (LPA): Lipoprotein (a): 47.8 nmol/L — ABNORMAL HIGH (ref <75.0)

## 2023-09-27 LAB — MAGNESIUM: Magnesium: 2.3 mg/dL (ref 1.7–2.4)

## 2023-09-27 MED ORDER — LOSARTAN POTASSIUM 25 MG PO TABS: 25.0000 mg | ORAL_TABLET | Freq: Every day | ORAL | 0 refills | Status: DC

## 2023-09-27 MED ORDER — ASPIRIN 81 MG PO TBEC
81.0000 mg | DELAYED_RELEASE_TABLET | Freq: Every day | ORAL | 0 refills | Status: AC
  Filled 2023-09-27: qty 30, 30d supply, fill #0

## 2023-09-27 MED ORDER — LOSARTAN POTASSIUM 25 MG PO TABS
25.0000 mg | ORAL_TABLET | Freq: Every day | ORAL | 0 refills | Status: AC
  Filled 2023-09-27: qty 30, 30d supply, fill #0

## 2023-09-27 MED ORDER — CYANOCOBALAMIN 1000 MCG PO TABS: 1000.0000 ug | ORAL_TABLET | Freq: Every day | ORAL | 0 refills | Status: DC

## 2023-09-27 MED ORDER — CYANOCOBALAMIN 1000 MCG PO TABS
1000.0000 ug | ORAL_TABLET | Freq: Every day | ORAL | 0 refills | Status: AC
  Filled 2023-09-27: qty 30, 30d supply, fill #0

## 2023-09-27 MED ORDER — ASPIRIN 81 MG PO TBEC
81.0000 mg | DELAYED_RELEASE_TABLET | Freq: Every day | ORAL | 0 refills | Status: DC
Start: 2023-09-28 — End: 2023-09-27

## 2023-09-27 MED ORDER — PANTOPRAZOLE SODIUM 40 MG PO TBEC
40.0000 mg | DELAYED_RELEASE_TABLET | Freq: Every day | ORAL | 0 refills | Status: AC
  Filled 2023-09-27: qty 30, 30d supply, fill #0

## 2023-09-27 MED ORDER — SODIUM CHLORIDE 0.9 % IV BOLUS
500.0000 mL | Freq: Once | INTRAVENOUS | Status: AC
  Administered 2023-09-27: 500 mL via INTRAVENOUS

## 2023-09-27 MED ORDER — SODIUM CHLORIDE 0.9 % IV BOLUS: 500.0000 mL | Freq: Once | INTRAVENOUS | Status: DC

## 2023-09-27 NOTE — Plan of Care (Signed)

## 2023-09-27 NOTE — Discharge Summary (Signed)
 Physician Discharge Summary   Patient: Scott [name] MRN: [id number] DOB: February 28, 1940  Admit date:     09/24/2023  Discharge date: 09/27/23  Discharge Physician: Murvin Mana   PCP: Rudolpho Norleen BIRCH, MD   Recommendations at discharge:   Follow-up PCP in 1 week. Follow-up with cardiology as scheduled. Follow-up with GI in 1 month.  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 *  Hospital Course: Scott [name] is a pleasant 82 y.o. male with medical history significant for CAD s/p CABG and multiple PCI's, atrial fibrillation on Eliquis , HLD, HTN, history of pulmonary embolism who came into the hospital with ongoing chest pain.  She has normal troponin.  Patient was diagnosed with unstable angina, heart catheter was performed on 9/2, showed 80% stenosis in circumflex, and 100% stenosis second marginal branch. Patient had repeated heart cath 9/3, not able to stent.  Decided to treat medically.  Postprocedure, patient had slightly worsening renal function.  Received IV fluid bolus, renal function has normalized today.  Medically stable for discharge.  Will follow-up with cardiology, PCP.  Cardiology also request a GI follow-up as outpatient to see if her symptoms was caused by GI problems.  Will add Protonix .  Assessment and Plan: Coronary artery disease with unstable angina. Cardiac cath results performed on 9/2 as above. - meds per cardiology, currently on atorva, apixaban , amlod, imdur , ranexa  Patient went to Cath Lab again yesterday, could not place a stent.  Decided to treat him medically.  Patient currently medically stable for discharge.  Acute kidney injury. Due to IV contrast.  Renal function has normalized  after giving IV fluid bolus.  Paroxysmal atrial fibrillation Not on BB due to baseline bradycardia - cont home apixaban    # History PE CTA no PE - cont home apixaban    # HTN BPs controlled - meds as above  Vitamin B12 deficient anemia. B12 level was 142 on 03/29/2023.  Start supplement       Consultants: Cardiology Procedures performed: Heart cath  Disposition: Home Diet recommendation:  Discharge Diet Orders (From admission, onward)     Start     Ordered   09/27/23 0000  Diet - low sodium heart healthy        09/27/23 1419           Cardiac diet DISCHARGE MEDICATION: Allergies as of 09/27/2023       Reactions   Contrast Media [iodinated Contrast Media] Hives   Metrizamide Hives   Oxycodone Hcl Itching   Vicodin [hydrocodone-acetaminophen ] Itching        Medication List     STOP taking these medications    carbamide peroxide 6.5 % OTIC solution Commonly known as: DEBROX   diclofenac  Sodium 1 % Gel Commonly known as: VOLTAREN    Ipratropium-Albuterol  20-100 MCG/ACT Aers respimat Commonly known as: COMBIVENT   levalbuterol 0.63 MG/3ML nebulizer solution Commonly known as: XOPENEX   midodrine  10 MG tablet Commonly known as: PROAMATINE    Ohtuvayre  3 MG/2.5ML Susp Generic drug: Ensifentrine    senna-docusate 8.6-50 MG tablet Commonly known as: Senokot-S       TAKE these medications    aspirin  EC 81 MG tablet Take 1 tablet (81 mg total) by mouth daily. Swallow whole. Start taking on:  September 28, 2023   atorvastatin  80 MG tablet Commonly known as: LIPITOR  Take 1 tablet (80 mg total) by mouth daily.   clindamycin 1 % external solution Commonly known as: CLEOCIN T Apply topically.   cyanocobalamin  1000 MCG tablet Take 1 tablet (1,000 mcg total) by mouth daily.   Eliquis  2.5 MG Tabs tablet Generic drug: apixaban  Take 1 tablet (2.5 mg total) by mouth 2 (two) times daily.   ezetimibe  10 MG tablet Commonly known as: ZETIA  Take 1  tablet (10 mg total) by mouth daily.   isosorbide  mononitrate 60 MG 24 hr tablet Commonly known as: IMDUR  Take 1 tablet (60 mg total) by mouth 2 (two) times daily.   losartan  25 MG tablet Commonly known as: COZAAR  Take 1 tablet (25 mg total) by mouth daily. Start taking on: September 28, 2023   nitroGLYCERIN  0.4 MG SL tablet Commonly known as: NITROSTAT  Place 1 tablet (0.4 mg total) under the tongue every 5 (five) minutes as needed for chest pain.   pantoprazole  40 MG tablet Commonly known as: Protonix  Take 1 tablet (40 mg total) by mouth daily.   ranolazine  1000 MG SR tablet Commonly known as: RANEXA  Take 1 tablet (1,000 mg total) by mouth 2 (two) times daily.        Follow-up Information     Paraschos, Alexander, MD. Go in 1 week(s).   Specialty: Cardiology Contact information: [address] [facility] [place] [address] [phone]         Rudolpho Norleen BIRCH, MD Follow up in 1 week(s).   Specialty: Internal Medicine Contact information: [address] [facility] [place] [address] [phone]         Therisa Bi, MD Follow up in 1 month(s).   Specialty: Gastroenterology Contact information: [address] [place] [address] [phone]                Discharge Exam: Filed Weights   09/25/23 0516 09/25/23 0721 09/26/23 0126  Weight: 76.6 kg 76.6 kg 75.8 kg   General exam: Appears calm and comfortable  Respiratory system: Clear to auscultation. Respiratory effort normal. Cardiovascular system: S1 & S2 heard, RRR. No JVD, murmurs, rubs, gallops or clicks. No pedal edema. Gastrointestinal system: Abdomen is nondistended, soft and nontender. No organomegaly or masses felt. Normal bowel sounds heard. Central nervous system: Alert and oriented. No focal neurological deficits. Extremities: Symmetric 5 x 5 power. Skin: No rashes, lesions or ulcers Psychiatry: Judgement and insight appear normal.  Mood & affect appropriate. '  Condition at discharge: good  The results of significant diagnostics from this hospitalization (including imaging, microbiology, ancillary and laboratory) are listed below for reference.   Imaging Studies: CARDIAC CATHETERIZATION Result Date: 09/26/2023   Ost LM to Mid LM lesion is 40% stenosed.   Prox LAD lesion is 50% stenosed.   Ost Cx to Prox Cx lesion is 80% stenosed.   2nd Mrg lesion is 100% stenosed.   Non-stenotic Prox LAD to Mid LAD lesion was previously treated.   Non-stenotic Ost RCA to Mid RCA lesion was previously treated.   Non-stenotic Mid RCA lesion was previously treated. 1.  Negative pressure wire interrogation of the left main and proximal LAD 2.  Optimize medical therapy   CARDIAC CATHETERIZATION Result Date: 09/25/2023   Ost Cx to Prox Cx lesion is 80% stenosed.   2nd Mrg lesion is 100% stenosed.   Prox LAD lesion is 50% stenosed.   Ost LM to Mid LM lesion  is 40% stenosed.   Non-stenotic Prox LAD to Mid LAD lesion was previously treated.   Non-stenotic Ost RCA to Mid RCA lesion was previously treated.   Non-stenotic Mid RCA lesion was previously treated.   There is mild left ventricular systolic dysfunction.   LV end diastolic pressure is normal.   The left ventricular ejection fraction is 35-45% by visual estimate. 1.  Three-vessel coronary artery disease with patent stent proximal/mid LAD, diffuse 80% stenosis ostial/proximal very small caliber left circumflex, patent stent proximal and mid RCA, with atretic but patent LIMA to distal LAD, and patent SVG to ramus intermedius branch, overall appears unchanged compared to prior cardiac catheterization 05/25/2020 2.  Mildly reduced left ventricular function with anterior apical hypokinesis, estimate LVEF 40% Recommendations 1.  Continue medical therapy 2.  Maximize antianginals 3.  Aggressive risk factor modification 4.  May discharge home later today   CT Angio Chest Pulmonary Embolism (PE) W or WO  Contrast Result Date: 09/24/2023 CLINICAL DATA:  Pulmonary embolism (PE) suspected, high prob Chest pain. EXAM: CT ANGIOGRAPHY CHEST WITH CONTRAST TECHNIQUE: Multidetector CT imaging of the chest was performed using the standard protocol during bolus administration of intravenous contrast. Multiplanar CT image reconstructions and MIPs were obtained to evaluate the vascular anatomy. Patient received 4 hour premedication for contrast allergy. RADIATION DOSE REDUCTION: This exam was performed according to the departmental dose-optimization program which includes automated exposure control, adjustment of the mA and/or kV according to patient size and/or use of iterative reconstruction technique. CONTRAST:  75mL OMNIPAQUE  IOHEXOL  350 MG/ML SOLN COMPARISON:  Radiograph earlier today. Chest CT 05/17/2023, high-resolution chest CT 08/01/2019 FINDINGS: Cardiovascular: There are no filling defects within the pulmonary arteries to suggest pulmonary embolus. Prior CABG. Dense calcification of native coronary arteries. The heart is mildly enlarged. Aortic atherosclerosis without acute aortic findings. No pericardial effusion. Mediastinum/Nodes: Stable shotty mediastinal lymph nodes, some of which are calcified. There are prominent bilateral hilar nodes. The esophagus is decompressed. Lungs/Pleura: Emphysema. Heterogeneous pulmonary parenchyma with areas of interspersed ground-glass. Overall low lung volumes with motion artifact. Assessment of bronchiectasis seen on prior is limited due to motion. No pleural effusion. Bowing of the central bronchi may represent bronchomalacia. No pulmonary mass. Upper Abdomen: No acute findings.  Gallstones. Musculoskeletal: Prior median sternotomy. There are no acute or suspicious osseous abnormalities. Review of the MIP images confirms the above findings. IMPRESSION: 1. No pulmonary embolus. 2. Heterogeneous pulmonary parenchyma with areas of interspersed ground-glass, likely hypoventilatory  change superimposed on chronic interstitial lung disease. 3. Bowing of the central bronchi may represent bronchomalacia. 4. Cholelithiasis. Aortic Atherosclerosis (ICD10-I70.0) and Emphysema (ICD10-J43.9). Electronically Signed   By: Andrea Gasman M.D.   On: 09/24/2023 20:23   DG Chest 1 View Result Date: 09/24/2023 CLINICAL DATA:  Chest pain EXAM: CHEST  1 VIEW COMPARISON:  04/03/2023 FINDINGS: Low volume film. Cardiopericardial silhouette is at upper limits of normal for size. There is pulmonary vascular congestion without overt pulmonary edema. No pneumothorax or pleural effusion. Telemetry leads overlie the chest. IMPRESSION: Low volume film with pulmonary vascular congestion. Electronically Signed   By: Camellia Candle M.D.   On: 09/24/2023 09:44    Microbiology: Results for orders placed or performed during the hospital encounter of 06/04/21  Resp Panel by RT-PCR (Flu A&B, Covid) Nasopharyngeal Swab     Status: None   Collection Time: 06/04/21 11:36 AM   Specimen: Nasopharyngeal Swab; Nasopharyngeal(NP) swabs in vial transport medium  Result Value Ref Range Status   SARS Coronavirus 2  by RT PCR NEGATIVE NEGATIVE Final    Comment: (NOTE) SARS-CoV-2 target nucleic acids are NOT DETECTED.  The SARS-CoV-2 RNA is generally detectable in upper respiratory specimens during the acute phase of infection. The lowest concentration of SARS-CoV-2 viral copies this assay can detect is 138 copies/mL. A negative result does not preclude SARS-Cov-2 infection and should not be used as the sole basis for treatment or other patient management decisions. A negative result may occur with  improper specimen collection/handling, submission of specimen other than nasopharyngeal swab, presence of viral mutation(s) within the areas targeted by this assay, and inadequate number of viral copies(<138 copies/mL). A negative result must be combined with clinical observations, patient history, and  epidemiological information. The expected result is Negative.  Fact Sheet for Patients:  BloggerCourse.com  Fact Sheet for Healthcare Providers:  SeriousBroker.it  This test is no t yet approved or cleared by the [place]  FDA and  has been authorized for detection and/or diagnosis of SARS-CoV-2 by FDA under an Emergency Use Authorization (EUA). This EUA will remain  in effect (meaning this test can be used) for the duration of the COVID-19 declaration under Section 564(b)(1) of the Act, 21 U.S.C.section 360bbb-3(b)(1), unless the authorization is terminated  or revoked sooner.       Influenza A by PCR NEGATIVE NEGATIVE Final   Influenza B by PCR NEGATIVE NEGATIVE Final    Comment: (NOTE) The Xpert Xpress SARS-CoV-2/FLU/RSV plus assay is intended as an aid in the diagnosis of influenza from Nasopharyngeal swab specimens and should not be used as a sole basis for treatment. Nasal washings and aspirates are unacceptable for Xpert Xpress SARS-CoV-2/FLU/RSV testing.  Fact Sheet for Patients: BloggerCourse.com  Fact Sheet for Healthcare Providers: SeriousBroker.it  This test is not yet approved or cleared by the [place]  FDA and has been authorized for detection and/or diagnosis of SARS-CoV-2 by FDA under an Emergency Use Authorization (EUA). This EUA will remain in effect (meaning this test can be used) for the duration of the COVID-19 declaration under Section 564(b)(1) of the Act, 21 U.S.C. section 360bbb-3(b)(1), unless the authorization is terminated or revoked.  Performed at [facility], [address] [place], [place] [address]     Labs: CBC: Recent Labs  Lab 09/24/23 0950 09/25/23 0520 09/26/23 0500 09/27/23 0424  WBC 5.3 9.7 14.3* 11.3*  NEUTROABS 2.8  --   --   --   HGB 14.7 14.6 15.0 13.8  HCT 44.2 44.6 44.5 40.5  MCV 97.4 97.6 96.7  96.7  PLT 200 191 205 192   Basic Metabolic Panel: Recent Labs  Lab 09/24/23 0950 09/25/23 0520 09/26/23 0500 09/27/23 0424 09/27/23 1246  NA 138 137 140 137 138  K 4.4 4.1 4.7 5.0 4.6  CL 103 104 102 105 102  CO2 26 26 24 26 27   GLUCOSE 106* 148* 127* 158* 180*  BUN 18 20 26* 40* 38*  CREATININE 1.40* 1.07 1.23 1.39* 1.19  CALCIUM  8.7* 8.4* 8.7* 8.2* 8.4*  MG  --   --   --  2.3  --    Liver Function Tests: Recent Labs  Lab 09/24/23 0950 09/25/23 0520  AST 21 24  ALT 15 14  ALKPHOS 38 39  BILITOT 0.6 0.8  PROT 6.3* 6.8  ALBUMIN  3.1* 3.3*   CBG: Recent Labs  Lab 09/25/23 2158  GLUCAP 115*    Discharge time spent: 35 minutes.  Signed: Murvin Mana, MD [facility] 09/27/2023

## 2023-09-27 NOTE — Progress Notes (Addendum)
 [facility] CLINIC CARDIOLOGY PROGRESS NOTE       Patient ID: Scott [name] MRN: [id number] DOB/AGE: Dec 31, 1940 83 y.o.  Admit date: 09/24/2023 Referring Physician Dr. Roann Primary Physician Rudolpho Norleen BIRCH, MD Primary Cardiologist Dr. Ammon Reason for Consultation chest pain  HPI: Scott [name] is a 83 y.o. male  with a past medical history of coronary artery disease s/p multiple stents and CABG x 2 (LIMA to LAD, SVG to ramus-06/2014), ischemic cardiomyopathy, paroxysmal atrial fibrillation, history of pulmonary embolism, history of bradycardia who presented to the ED on 09/24/2023 for chest pain associated with SOB, lightheadedness. Patient states he is very active and doesn't have chest pain on exertion. Also states SL nitroglycerin  doesn't relieve his chest pain. Cardiology was consulted for further evaluation for unstable angina.   Interval History: -Patient seen and examined this AM and laying comfortably in hospital bed wife at bedside.  Patient states overall he feels good.  Patient denies any more recurrence of chest pain. -Patients BP improving and HR stable this AM. Overnight Tele showed no significant events.  -Patient remains on room air with stable SpO2.  -Left radial and right groin incision site is clean and dry with no evidence of significant swelling, bruising, or active bleeding.  LHC (09/26/2023)   Ost LM to Mid LM lesion is 40% stenosed.   Prox LAD lesion is 50% stenosed.   Ost Cx to Prox Cx lesion is 80% stenosed.   2nd Mrg lesion is 100% stenosed.   Non-stenotic Prox LAD to Mid LAD lesion was previously treated.   Non-stenotic Ost RCA to Mid RCA lesion was previously treated.   Non-stenotic Mid RCA lesion was previously treated.   1.  Negative pressure wire interrogation of the left main and proximal LAD 2.  Optimize medical therapy  LHC (09/25/2023)   Ost Cx to Prox Cx lesion is 80% stenosed.   2nd Mrg lesion is 100% stenosed.   Prox LAD lesion is 50%  stenosed.   Ost LM to Mid LM lesion is 40% stenosed.   Non-stenotic Prox LAD to Mid LAD lesion was previously treated.   Non-stenotic Ost RCA to Mid RCA lesion was previously treated.   Non-stenotic Mid RCA lesion was previously treated.   There is mild left ventricular systolic dysfunction.   LV end diastolic pressure is normal.   The left ventricular ejection fraction is 35-45% by visual estimate.   1.  Three-vessel coronary artery disease with patent stent proximal/mid LAD, diffuse 80% stenosis ostial/proximal very small caliber left circumflex, patent stent proximal and mid RCA, with atretic but patent LIMA to distal LAD, and patent SVG to ramus intermedius branch, overall appears unchanged compared to prior cardiac catheterization 05/25/2020 2.  Mildly reduced left ventricular function with anterior apical hypokinesis, estimate LVEF 40%   Review of systems complete and found to be negative unless listed above    Past Medical History:  Diagnosis Date   Arthritis    right hip, since a fall   CAD (coronary artery disease)    a. 04/2012 Cath: 3VD->Med Rx;  b. 04/2013 PCI RCA (2 DES); c. 03/2014 PCI: LAD 80 (3.0x23 Xience Alpine DES); d. 06/2014 CABG x 2 ([facility]) LIMA->LAD, VG->OM; e. 12/2014 Cath ([facility]): patent grafts->Med Rx; f. 08/2015 Cath: patent grafts; g. 11/2016 Cath: LM min irregs, LAD 20p, 42m, LCX 100p/m, RCA 20ost, 10p/m/d, VG->OM1 nl, LIMA->dLAD nl; f. 07/2017 MV: No isch, EF 51%.   Cardiomyopathy, ischemic    a. 04/2012 Echo: EF 40-45%;  b. 08/2013 Echo: EF 45-50%; c. 01/2015 Echo: EF 40-45%; d. 07/2016 Echo: EF 60-65%; e. 09/2017 Echo: EF 55-60%, Gr1 DD.   Carotid arterial disease (HCC)    a. 05/2013 Carotid U/S; bilat 40-50% ICA stenosis.   Chronic Chest Pain    USES NITRO   Chronic systolic CHF (congestive heart failure) (HCC)    a. 01/2015 Echo: EF 40-45%; b. 07/2016 Echo: EF 60-65%; c. 09/2017 Echo: EF 55-60%, Gr1 DD, nl RV fxn.   Cough    Diverticulosis    Dizziness    Dyspnea     WITH EXERTION   Headache 1970's   migraine   History of blood transfusion 2016   post op   Hyperlipidemia    Hypertension    Iron  deficiency anemia due to chronic blood loss 09/11/2016   Myocardial infarction [facility])    unsure of when   Pulmonary emboli (HCC) 07/2016   a. On Xarelto .   Reflux esophagitis    Sternal pain    a. 03/2016 s/p Sternal wire removal; b. 05/2016 s/p redo median sternotomy for sternal debridement and sternal plating.   Syncope 04/2018   CARDIOLOGIST WAS DR PERLA AND HE WAS AWARE-NOW PT SEES PARASCHOS   Tubular adenoma of colon     Past Surgical History:  Procedure Laterality Date   CARDIAC CATHETERIZATION  05/01/2012   CARDIAC CATHETERIZATION  04/2013   [facility];x3 stent   CARDIAC CATHETERIZATION  01/11/15    [facility]   CARDIAC CATHETERIZATION N/A 09/16/2015   Procedure: LEFT HEART CATH AND CORS/GRAFTS ANGIOGRAPHY;  Surgeon: Evalene JINNY PERLA, MD;  Location: [facility] INVASIVE CV LAB;  Service: Cardiovascular;  Laterality: N/A;   CARPAL TUNNEL RELEASE     right hand   CATARACT EXTRACTION     LEFT   CATARACT EXTRACTION W/PHACO Left 09/16/2014   Procedure: CATARACT EXTRACTION PHACO AND INTRAOCULAR LENS PLACEMENT (IOC);  Surgeon: Dene Etienne, MD;  Location: [facility];  Service: Ophthalmology;  Laterality: Left;   COLONOSCOPY     COLONOSCOPY WITH PROPOFOL  N/A 01/28/2016   Procedure: COLONOSCOPY WITH PROPOFOL ;  Surgeon: Gladis RAYMOND Mariner, MD;  Location: [facility] ENDOSCOPY;  Service: Endoscopy;  Laterality: N/A;   COLONOSCOPY WITH PROPOFOL  N/A 08/23/2021   Procedure: COLONOSCOPY WITH PROPOFOL ;  Surgeon: Unk Corinn Skiff, MD;  Location: [facility] ENDOSCOPY;  Service: Gastroenterology;  Laterality: N/A;  REQUESTS 9AM OR LATER   CORONARY ANGIOPLASTY WITH STENT PLACEMENT  04/13/2014   CORONARY ARTERY BYPASS GRAFT  06-26-14   x3 bypasses   CORONARY PRESSURE/FFR WITH 3D MAPPING N/A 12/10/2018   Procedure: KEN PRESSURE WIRE/FFR STUDY;  Surgeon: Ammon Blunt, MD;   Location: [facility] INVASIVE CV LAB;  Service: Cardiovascular;  Laterality: N/A;   CORONARY ULTRASOUND/IVUS N/A 12/10/2018   Procedure: Intravascular Ultrasound/IVUS;  Surgeon: Ammon Blunt, MD;  Location: [facility] INVASIVE CV LAB;  Service: Cardiovascular;  Laterality: N/A;   CYSTOSCOPY N/A 08/14/2018   Procedure: CYSTOSCOPY;  Surgeon: Twylla Glendia BROCKS, MD;  Location: [facility] ORS;  Service: Urology;  Laterality: N/A;   DE QUERVAIN'S RELEASE Left 08/22/2012   ESOPHAGOGASTRODUODENOSCOPY (EGD) WITH PROPOFOL  N/A 04/26/2015   Procedure: ESOPHAGOGASTRODUODENOSCOPY (EGD) WITH PROPOFOL ;  Surgeon: Deward CINDERELLA Piedmont, MD;  Location: [facility] ENDOSCOPY;  Service: Gastroenterology;  Laterality: N/A;   ESOPHAGOGASTRODUODENOSCOPY (EGD) WITH PROPOFOL  N/A 05/29/2017   Procedure: ESOPHAGOGASTRODUODENOSCOPY (EGD) WITH PROPOFOL ;  Surgeon: Mariner Gladis RAYMOND, MD;  Location: [facility] ENDOSCOPY;  Service: Endoscopy;  Laterality: N/A;   LEFT HEART CATH AND CORONARY ANGIOGRAPHY N/A 12/04/2016   Procedure: LEFT HEART CATH AND  CORS/GRAFTS  ANGIOGRAPHY;  Surgeon: Darron Deatrice LABOR, MD;  Location: [facility] INVASIVE CV LAB;  Service: Cardiovascular;  Laterality: N/A;   LEFT HEART CATH AND CORONARY ANGIOGRAPHY Right 09/25/2023   Procedure: LEFT HEART CATH AND CORONARY ANGIOGRAPHY with possible coronary intervention;  Surgeon: Ammon Blunt, MD;  Location: [facility] INVASIVE CV LAB;  Service: Cardiovascular;  Laterality: Right;   LEFT HEART CATH AND CORS/GRAFTS ANGIOGRAPHY N/A 04/09/2018   Procedure: LEFT HEART CATH AND CORS/GRAFTS ANGIOGRAPHY;  Surgeon: Ammon Blunt, MD;  Location: [facility] INVASIVE CV LAB;  Service: Cardiovascular;  Laterality: N/A;   LEFT HEART CATH AND CORS/GRAFTS ANGIOGRAPHY N/A 12/10/2018   Procedure: LEFT HEART CATH AND CORS/GRAFTS ANGIOGRAPHY;  Surgeon: Ammon Blunt, MD;  Location: [facility] INVAS[facility] CV LAB;  Service: Cardiovascular;  Laterality: N/A;   LEFT HEART CATH AND CORS/GRAFTS ANGIOGRAPHY Left 05/25/2020   Procedure:  LEFT HEART CATH AND CORS/GRAFTS ANGIOGRAPHY;  Surgeon: Ammon Blunt, MD;  Location: [facility] INVASIVE CV LAB;  Service: Cardiovascular;  Laterality: Left;   RIB PLATING N/A 05/25/2016   Procedure: STERNAL PLATING;  Surgeon: Elspeth JAYSON Millers, MD;  Location: [facility] OR;  Service: Thoracic;  Laterality: N/A;   right shoulder     STERNAL WIRES REMOVAL N/A 03/30/2016   Procedure: STERNAL WIRES REMOVAL;  Surgeon: Elspeth JAYSON Millers, MD;  Location: [facility] OR;  Service: Thoracic;  Laterality: N/A;   TONSILLECTOMY      Medications Prior to Admission  Medication Sig Dispense Refill Last Dose/Taking   atorvastatin  (LIPITOR ) 80 MG tablet Take 1 tablet (80 mg total) by mouth daily. 30 tablet 0 09/23/2023   clindamycin (CLEOCIN T) 1 % external solution Apply topically.   09/23/2023   ELIQUIS  2.5 MG TABS tablet Take 1 tablet (2.5 mg total) by mouth 2 (two) times daily. 60 tablet 0 09/24/2023 at  8:00 AM   ezetimibe  (ZETIA ) 10 MG tablet Take 1 tablet (10 mg total) by mouth daily. 30 tablet 0 09/23/2023   isosorbide  mononitrate (IMDUR ) 60 MG 24 hr tablet Take 1 tablet (60 mg total) by mouth 2 (two) times daily. 60 tablet 0 09/24/2023 Morning   nitroGLYCERIN  (NITROSTAT ) 0.4 MG SL tablet Place 1 tablet (0.4 mg total) under the tongue every 5 (five) minutes as needed for chest pain. 30 tablet 0 Unknown   ranolazine  (RANEXA ) 1000 MG SR tablet Take 1 tablet (1,000 mg total) by mouth 2 (two) times daily. 60 tablet 0 09/24/2023 Morning   carbamide peroxide (DEBROX) 6.5 % OTIC solution Place 5 drops into both ears 2 (two) times daily. (Patient not taking: Reported on 04/12/2023) 15 mL 0    diclofenac  Sodium (VOLTAREN ) 1 % GEL Apply 2 g topically 4 (four) times daily. (Patient not taking: Reported on 04/12/2023) 100 g 0    Ipratropium-Albuterol  (COMBIVENT) 20-100 MCG/ACT AERS respimat Inhale 2 puffs into the lungs 4 (four) times daily as needed for wheezing. (Patient not taking: Reported on 09/24/2023)   Not Taking   levalbuterol  (XOPENEX) 0.63 MG/3ML nebulizer solution Inhale into the lungs. (Patient not taking: Reported on 04/12/2023)      midodrine  (PROAMATINE ) 10 MG tablet Take 1 tablet (10 mg total) by mouth 3 (three) times daily with meals. (Patient not taking: Reported on 09/24/2023) 90 tablet 0 Not Taking   OHTUVAYRE  3 MG/2.5ML SUSP  (Patient not taking: Reported on 09/24/2023)   Not Taking   senna-docusate (SENOKOT-S) 8.6-50 MG tablet Take 1 tablet by mouth at bedtime as needed for mild constipation. (Patient not taking: Reported on 09/24/2023) 30 tablet 0  Not Taking   Social History   Socioeconomic History   Marital status: Married    Spouse name: Not on file   Number of children: Not on file   Years of education: Not on file   Highest education level: Not on file  Occupational History   Not on file  Tobacco Use   Smoking status: Former    Current packs/day: 0.00    Average packs/day: 1 pack/day for 35.0 years (35.0 ttl pk-yrs)    Types: Cigarettes    Start date: 30    Quit date: 2009    Years since quitting: 16.6   Smokeless tobacco: Never  Vaping Use   Vaping status: Never Used  Substance and Sexual Activity   Alcohol use: No   Drug use: No   Sexual activity: Yes  Other Topics Concern   Not on file  Social History Narrative   Not on file   Social Drivers of Health   Financial Resource Strain: Low Risk  (08/14/2023)   Received from [facility]   Overall Financial Resource Strain (CARDIA)    Difficulty of Paying Living Expenses: Not hard at all  Food Insecurity: No Food Insecurity (09/24/2023)   Hunger Vital Sign    Worried About Running Out of Food in the Last Year: Never true    Ran Out of Food in the Last Year: Never true  Transportation Needs: No Transportation Needs (09/24/2023)   PRAPARE - Administrator, Civil Service (Medical): No    Lack of Transportation (Non-Medical): No  Physical Activity: Not on file  Stress: Not on file  Social Connections:  Moderately Integrated (09/24/2023)   Social Connection and Isolation Panel    Frequency of Communication with Friends and Family: Three times a week    Frequency of Social Gatherings with Friends and Family: More than three times a week    Attends Religious Services: More than 4 times per year    Active Member of Clubs or Organizations: No    Attends Banker Meetings: Patient declined    Marital Status: Married  Catering manager Violence: Not At Risk (09/24/2023)   Humiliation, Afraid, Rape, and Kick questionnaire    Fear of Current or Ex-Partner: No    Emotionally Abused: No    Physically Abused: No    Sexually Abused: No    Family History  Problem Relation Age of Onset   Heart attack Father 68       MI   Cancer Maternal Uncle      Vitals:   09/26/23 2300 09/27/23 0400 09/27/23 0700 09/27/23 0753  BP:  (!) 105/57  (!) 112/59  Pulse: 60 (!) 50    Resp:  12  (!) 21  Temp: 97.6 F (36.4 C) 97.6 F (36.4 C) (!) 97.5 F (36.4 C)   TempSrc: Axillary Oral    SpO2: 97% 98% 100%   Weight:      Height:        PHYSICAL EXAM General: Well-appearing elderly male, well nourished, in no acute distress. HEENT: Normocephalic and atraumatic. Neck: No JVD.   Lungs: Normal respiratory effort on room air. Clear bilaterally to auscultation. No wheezes, crackles, rhonchi.  Heart: HRRR. Normal S1 and S2 without gallops or murmurs.  Abdomen: Non-distended appearing.  Msk: Normal strength and tone for age. Extremities: Warm and well perfused. No clubbing, cyanosis, edema.  Neuro: Alert and oriented X 3. Psych: Answers questions appropriately.   Labs: Basic Metabolic Panel:  Recent Labs    09/26/23 0500 09/27/23 0424  NA 140 137  K 4.7 5.0  CL 102 105  CO2 24 26  GLUCOSE 127* 158*  BUN 26* 40*  CREATININE 1.23 1.39*  CALCIUM  8.7* 8.2*  MG  --  2.3   Liver Function Tests: Recent Labs    09/24/23 0950 09/25/23 0520  AST 21 24  ALT 15 14  ALKPHOS 38 39  BILITOT 0.6  0.8  PROT 6.3* 6.8  ALBUMIN  3.1* 3.3*   No results for input(s): LIPASE, AMYLASE in the last 72 hours. CBC: Recent Labs    09/24/23 0950 09/25/23 0520 09/26/23 0500 09/27/23 0424  WBC 5.3   < > 14.3* 11.3*  NEUTROABS 2.8  --   --   --   HGB 14.7   < > 15.0 13.8  HCT 44.2   < > 44.5 40.5  MCV 97.4   < > 96.7 96.7  PLT 200   < > 205 192   < > = values in this interval not displayed.   Cardiac Enzymes: Recent Labs    09/24/23 1249 09/24/23 1450 09/24/23 1637  TROPONINIHS 15 14 13    BNP: Recent Labs    09/24/23 0950  BNP 124.6*   D-Dimer: Recent Labs    09/24/23 0950  DDIMER 0.55*   Hemoglobin A1C: No results for input(s): HGBA1C in the last 72 hours. Fasting Lipid Panel: No results for input(s): CHOL, HDL, LDLCALC, TRIG, CHOLHDL, LDLDIRECT in the last 72 hours. Thyroid  Function Tests: No results for input(s): TSH, T4TOTAL, T3FREE, THYROIDAB in the last 72 hours.  Invalid input(s): FREET3 Anemia Panel: No results for input(s): VITAMINB12, FOLATE, FERRITIN, TIBC, IRON , RETICCTPCT in the last 72 hours.   Radiology: CARDIAC CATHETERIZATION Result Date: 09/26/2023   Ost LM to Mid LM lesion is 40% stenosed.   Prox LAD lesion is 50% stenosed.   Ost Cx to Prox Cx lesion is 80% stenosed.   2nd Mrg lesion is 100% stenosed.   Non-stenotic Prox LAD to Mid LAD lesion was previously treated.   Non-stenotic Ost RCA to Mid RCA lesion was previously treated.   Non-stenotic Mid RCA lesion was previously treated. 1.  Negative pressure wire interrogation of the left main and proximal LAD 2.  Optimize medical therapy   CARDIAC CATHETERIZATION Result Date: 09/25/2023   Ost Cx to Prox Cx lesion is 80% stenosed.   2nd Mrg lesion is 100% stenosed.   Prox LAD lesion is 50% stenosed.   Ost LM to Mid LM lesion is 40% stenosed.   Non-stenotic Prox LAD to Mid LAD lesion was previously treated.   Non-stenotic Ost RCA to Mid RCA lesion was previously treated.    Non-stenotic Mid RCA lesion was previously treated.   There is mild left ventricular systolic dysfunction.   LV end diastolic pressure is normal.   The left ventricular ejection fraction is 35-45% by visual estimate. 1.  Three-vessel coronary artery disease with patent stent proximal/mid LAD, diffuse 80% stenosis ostial/proximal very small caliber left circumflex, patent stent proximal and mid RCA, with atretic but patent LIMA to distal LAD, and patent SVG to ramus intermedius branch, overall appears unchanged compared to prior cardiac catheterization 05/25/2020 2.  Mildly reduced left ventricular function with anterior apical hypokinesis, estimate LVEF 40% Recommendations 1.  Continue medical therapy 2.  Maximize antianginals 3.  Aggressive risk factor modification 4.  May discharge home later today   CT Angio Chest Pulmonary Embolism (PE) W or WO Contrast  Result Date: 09/24/2023 CLINICAL DATA:  Pulmonary embolism (PE) suspected, high prob Chest pain. EXAM: CT ANGIOGRAPHY CHEST WITH CONTRAST TECHNIQUE: Multidetector CT imaging of the chest was performed using the standard protocol during bolus administration of intravenous contrast. Multiplanar CT image reconstructions and MIPs were obtained to evaluate the vascular anatomy. Patient received 4 hour premedication for contrast allergy. RADIATION DOSE REDUCTION: This exam was performed according to the departmental dose-optimization program which includes automated exposure control, adjustment of the mA and/or kV according to patient size and/or use of iterative reconstruction technique. CONTRAST:  75mL OMNIPAQUE  IOHEXOL  350 MG/ML SOLN COMPARISON:  Radiograph earlier today. Chest CT 05/17/2023, high-resolution chest CT 08/01/2019 FINDINGS: Cardiovascular: There are no filling defects within the pulmonary arteries to suggest pulmonary embolus. Prior CABG. Dense calcification of native coronary arteries. The heart is mildly enlarged. Aortic atherosclerosis without  acute aortic findings. No pericardial effusion. Mediastinum/Nodes: Stable shotty mediastinal lymph nodes, some of which are calcified. There are prominent bilateral hilar nodes. The esophagus is decompressed. Lungs/Pleura: Emphysema. Heterogeneous pulmonary parenchyma with areas of interspersed ground-glass. Overall low lung volumes with motion artifact. Assessment of bronchiectasis seen on prior is limited due to motion. No pleural effusion. Bowing of the central bronchi may represent bronchomalacia. No pulmonary mass. Upper Abdomen: No acute findings.  Gallstones. Musculoskeletal: Prior median sternotomy. There are no acute or suspicious osseous abnormalities. Review of the MIP images confirms the above findings. IMPRESSION: 1. No pulmonary embolus. 2. Heterogeneous pulmonary parenchyma with areas of interspersed ground-glass, likely hypoventilatory change superimposed on chronic interstitial lung disease. 3. Bowing of the central bronchi may represent bronchomalacia. 4. Cholelithiasis. Aortic Atherosclerosis (ICD10-I70.0) and Emphysema (ICD10-J43.9). Electronically Signed   By: Andrea Gasman M.D.   On: 09/24/2023 20:23   DG Chest 1 View Result Date: 09/24/2023 CLINICAL DATA:  Chest pain EXAM: CHEST  1 VIEW COMPARISON:  04/03/2023 FINDINGS: Low volume film. Cardiopericardial silhouette is at upper limits of normal for size. There is pulmonary vascular congestion without overt pulmonary edema. No pneumothorax or pleural effusion. Telemetry leads overlie the chest. IMPRESSION: Low volume film with pulmonary vascular congestion. Electronically Signed   By: Camellia Candle M.D.   On: 09/24/2023 09:44    ECHO 03/27/2023  1. Left ventricular ejection fraction, by estimation, is 40 to 45%. The left ventricle has normal function. The left ventricle demonstrates global hypokinesis. Left ventricular diastolic parameters are consistent with Grade III diastolic dysfunction (restrictive).   2. Right ventricular  systolic function is normal. The right ventricular size is normal.   3. Left atrial size was moderately dilated.   4. Right atrial size was moderately dilated.   5. The mitral valve is normal in structure. Mild mitral valve regurgitation.   6. The aortic valve is normal in structure. Aortic valve regurgitation is not visualized.   TELEMETRY reviewed by me 09/27/2023: sinus rhythm, with frequent PVCs, rate 50s  EKG reviewed by me: Sinus rhythm, first-degree AVB, RBBB, PVC, rate 54 bpm  Data reviewed by me 09/27/2023: last 24h vitals tele labs imaging I/O hospitalist progress notes.  Principal Problem:   Unstable angina (HCC) Active Problems:   Coronary artery disease involving coronary bypass graft of native heart with angina pectoris (HCC)   PAF (paroxysmal atrial fibrillation) (HCC)   Anxiety   Pulmonary embolus (HCC)    ASSESSMENT AND PLAN:  Scott [name] is a 83 y.o. male  with a past medical history of coronary artery disease s/p multiple stents and CABG x 2 (LIMA to LAD, SVG  to ramus-06/2024), ischemic cardiomyopathy, paroxysmal atrial fibrillation, history of pulmonary embolism, history of bradycardia who presented to the ED on 09/24/2023 for chest pain associated with SOB, lightheadedness. Patient states he is very active and doesn't have chest pain on exertion. Also states SL nitroglycerin  doesn't relieve his chest pain. Cardiology was consulted for further evaluation for unstable angina.   # Possible microvascular disease # Unstable/chronic angina # CAD s/p multiple stents s/p CABG x 2 # Ischemic cardiomyopathy (EF 40-45%) # Paroxysmal atrial fibrillation # History of bradycardia Patient underwent LHC with Dr. Ammon on 09/02.  No intervention done, recommended continue medical therapy, maximizing antianginals and aggressive risk factor modification.  Patient underwent LHC with Dr. Katina on 09/03 that revealed negative pressure wire interrogation of the left main and proximal  LAD, no stent intervention. - Continue aspirin  81 mg daily.  - Continue home atorvastatin  80 mg, Zetia  10 mg daily. - Continue Eliquis  2.5 mg twice daily for stroke risk reduction. - Continue losartan  25 mg daily. - Holding amlodipine  due to borderline BP.   - Continue home Imdur  60 mg BID.  Uptitration limited due to borderline BP. - Continue Ranexa  1000 mg twice daily. - Not on beta-blocker due to baseline bradycardia.  Follow up in clinic with Dr. Ammon scheduled for 09/15. Ok for discharge today from a cardiac perspective. Will sign off   This patient's plan of care was discussed and created with Dr. Florencio and he is in agreement.  Signed: Reyden Smith, PA-C  09/27/2023, 9:15 AM [facility]

## 2023-11-05 ENCOUNTER — Ambulatory Visit: Admitting: Dermatology

## 2023-11-05 ENCOUNTER — Encounter: Payer: Self-pay | Admitting: Dermatology

## 2023-11-05 DIAGNOSIS — L739 Follicular disorder, unspecified: Secondary | ICD-10-CM | POA: Diagnosis not present

## 2023-11-05 DIAGNOSIS — D692 Other nonthrombocytopenic purpura: Secondary | ICD-10-CM

## 2023-11-05 NOTE — Patient Instructions (Signed)

## 2023-11-05 NOTE — Progress Notes (Signed)
   New Patient Visit   Subjective  Scott [name] is a 83 y.o. male who presents for the following: spot at right neck that was there for about a month and has gotten better since making appointment. Was a little bit sore and sometimes felt like it was sunburn. Similar spot at left neck. No hx skin cancer. Bumps at scalp.   Patient accompanied by wife who contributes to history. Photo provided by wife.  The following portions of the chart were reviewed this encounter and updated as appropriate: medications, allergies, medical history  Review of Systems:  No other skin or systemic complaints except as noted in HPI or Assessment and Plan.  Objective  Well appearing patient in no apparent distress; mood and affect are within normal limits.   A focused examination was performed of the following areas: Neck, arms, scalp  Relevant exam findings are noted in the Assessment and Plan.    Assessment & Plan   Purpura - Chronic; persistent and recurrent.  Treatable, but not curable. - Violaceous macules and patches L and R neck with right side resolving, forearms dorsal hands - Benign - Related to trauma, age, sun damage and/or use of blood thinners, chronic use of topical and/or oral steroids - Observe - protect skin by covering up when able - Call for worsening or other concerns  FOLLICULITIS Exam: excoriated papules at occipital scalp  Folliculitis occurs due to inflammation of the superficial hair follicle (pore), resulting in acne-like lesions (pus bumps). It can be infectious (bacterial, fungal) or noninfectious (shaving, tight clothing, heat/sweat, medications).  Folliculitis can be acute or chronic and recommended treatment depends on the underlying cause of folliculitis.  Treatment Plan: Not bothersome for patient.   SOLAR PURPURA   FOLLICULITIS    Return if symptoms worsen or fail to improve.  Scott [name], RMA, am acting as scribe for Boneta Sharps, MD  .   Documentation: I have reviewed the above documentation for accuracy and completeness, and I agree with the above.  Boneta Sharps, MD

## 2023-11-15 ENCOUNTER — Inpatient Hospital Stay: Admitting: Oncology

## 2023-11-15 ENCOUNTER — Inpatient Hospital Stay: Attending: Oncology

## 2023-11-15 ENCOUNTER — Encounter: Payer: Self-pay | Admitting: Oncology

## 2023-11-15 VITALS — BP 99/53 | HR 61 | Temp 97.9°F | Resp 19 | Ht 70.0 in | Wt 179.3 lb

## 2023-11-15 DIAGNOSIS — I2699 Other pulmonary embolism without acute cor pulmonale: Secondary | ICD-10-CM | POA: Diagnosis not present

## 2023-11-15 DIAGNOSIS — E611 Iron deficiency: Secondary | ICD-10-CM

## 2023-11-15 DIAGNOSIS — Z7901 Long term (current) use of anticoagulants: Secondary | ICD-10-CM | POA: Insufficient documentation

## 2023-11-15 DIAGNOSIS — Z86711 Personal history of pulmonary embolism: Secondary | ICD-10-CM | POA: Insufficient documentation

## 2023-11-15 DIAGNOSIS — R0602 Shortness of breath: Secondary | ICD-10-CM | POA: Insufficient documentation

## 2023-11-15 DIAGNOSIS — J439 Emphysema, unspecified: Secondary | ICD-10-CM | POA: Insufficient documentation

## 2023-11-15 DIAGNOSIS — D509 Iron deficiency anemia, unspecified: Secondary | ICD-10-CM | POA: Diagnosis present

## 2023-11-15 DIAGNOSIS — Z87891 Personal history of nicotine dependence: Secondary | ICD-10-CM | POA: Diagnosis not present

## 2023-11-15 DIAGNOSIS — I2510 Atherosclerotic heart disease of native coronary artery without angina pectoris: Secondary | ICD-10-CM | POA: Diagnosis not present

## 2023-11-15 LAB — CBC WITH DIFFERENTIAL (CANCER CENTER ONLY)
Abs Immature Granulocytes: 0.02 K/uL (ref 0.00–0.07)
Basophils Absolute: 0 K/uL (ref 0.0–0.1)
Basophils Relative: 1 %
Eosinophils Absolute: 0.1 K/uL (ref 0.0–0.5)
Eosinophils Relative: 1 %
HCT: 43.5 % (ref 39.0–52.0)
Hemoglobin: 14.6 g/dL (ref 13.0–17.0)
Immature Granulocytes: 0 %
Lymphocytes Relative: 30 %
Lymphs Abs: 2 K/uL (ref 0.7–4.0)
MCH: 32.7 pg (ref 26.0–34.0)
MCHC: 33.6 g/dL (ref 30.0–36.0)
MCV: 97.3 fL (ref 80.0–100.0)
Monocytes Absolute: 0.6 K/uL (ref 0.1–1.0)
Monocytes Relative: 9 %
Neutro Abs: 3.8 K/uL (ref 1.7–7.7)
Neutrophils Relative %: 59 %
Platelet Count: 237 K/uL (ref 150–400)
RBC: 4.47 MIL/uL (ref 4.22–5.81)
RDW: 13.3 % (ref 11.5–15.5)
WBC Count: 6.5 K/uL (ref 4.0–10.5)
nRBC: 0 % (ref 0.0–0.2)

## 2023-11-15 LAB — FERRITIN: Ferritin: 19 ng/mL — ABNORMAL LOW (ref 24–336)

## 2023-11-15 LAB — IRON AND TIBC
Iron: 84 ug/dL (ref 45–182)
Saturation Ratios: 23 % (ref 17.9–39.5)
TIBC: 361 ug/dL (ref 250–450)
UIBC: 277 ug/dL

## 2023-11-15 NOTE — Assessment & Plan Note (Signed)
 Labs are reviewed and discussed with patient. Lab Results  Component Value Date   HGB 14.6 11/15/2023   TIBC 332 05/09/2023   IRONPCTSAT 29 05/09/2023   FERRITIN 34 05/09/2023    Hemoglobin is within normal limit.  Iron  panel normalized.  No need for additional IV venofer .

## 2023-11-15 NOTE — Progress Notes (Signed)
 Hematology/Oncology Progress note [phone] [phone]     Patient Care Team: Rudolpho Norleen BIRCH, MD as PCP - General (Internal Medicine) Perla Evalene PARAS, MD as PCP - Cardiology (Cardiology) Donette Ellouise LABOR, FNP as Nurse Practitioner (Cardiology) Perla Evalene PARAS, MD as Consulting Physician (Cardiology) Babara Call, MD as Consulting Physician (Oncology)  REASON FOR VISIT Follow up for iron  deficiency anemia, pulmonary embolism   ASSESSMENT & PLAN:   Iron  deficiency Labs are reviewed and discussed with patient. Lab Results  Component Value Date   HGB 14.6 11/15/2023   TIBC 332 05/09/2023   IRONPCTSAT 29 05/09/2023   FERRITIN 34 05/09/2023    Hemoglobin is within normal limit.  Iron  panel normalized.  No need for additional IV venofer .    Pulmonary embolus (HCC) continue Eliquis  2.5mg  BID.   Eliquis  prescription is not managed by me.  SOB (shortness of breath) Multifactorial. Fatigue and shortness of breath level is out of proportion to his normal hemoglobin and mild iron  deficiency. Likely secondary to his CAD and emphysema. Recommend patient to follow-up with cardiology and pulmonologist.   No orders of the defined types were placed in this encounter.  Follow-up PRN All questions were answered. The patient knows to call the clinic with any problems, questions or concerns.  Call Babara, MD, PhD [facility] Hematology Oncology 11/15/2023  '   HISTORY OF PRESENTING ILLNESS: Scott [name] 83 y.o. male with PMH listed as below who is referred to me for evaluation and management of pulmonary embolism.  Scott [name] has a history paroxymal atrial fibrillation on Eliquis  5mg  BID until recently, history of CAD s/p CABG x 2 at [facility] in 2016 with chronic chest pain, s/p removal of sternal wire on 03/2016,  s/p sternal debridement and plating on 05/25/2016 in the setting of CT chest performed by pain management showing sternal nonunion of the manubrium with  subsequent improvement in his pain. Perioperatively his anticoagulation with Eliquis  was discontinue a few days prior surgery and resumed after surgery. Operation note mentioned that during the procedure, the left lung was densely adherent to the underside of the sternum and tear in the lung did occur and was sutured. He presents  to [facility] on 08/04/2016 for evaluation of SOB and was found to have a PE. Patient states that he was compliant with Eliquis .  CTA chest showed left upper lobe PE extending into the left main pulmonary artery. His Eliquis  was changed to Xarelto . Lower extremity dopplers were negative for DVT. Hypercoagulable workup was sent and all tests were negative except a mild low protein S function.  No formal cancer workup.  Sed rate normal, heavy metals negative, TSH mildly reduced at 0.253. 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     Hypercoagulable work up: discussed with patient. He is tested negative for factor 5 leiden mutation, prothrombin mutation, anti phospholipin antibody panels. Normal protein C activity. Normal total protein S, mildly low protein S function, can be falsely low in the acute setting of thrombosis.Congential protein S deficiency is unlikely given that he has no prior history of VTE. Repeat protein S total and free were normal.   # He has had one dose of Feraheme  and during the treatment he had a chest tightness and end of infusion. Has received IV iron  Venofer  and tolerates well.   #chronic intermittent chest pain and shortness of breath that has been previously extensively worked up.  He  follows up with Dr. Jerome for his heart care.  Extensive cardiology disease CAD status post prior RCA and LAD stenting as well as CABG x2 chronic chest pain status post sternal wire removal with sternal debridement and plating, ischemia cardiomyopathy, chronic dyspnea.  # CT angios chest PE protocol done on  11/07/2016 showed no lung nodules or mass.  INTERVAL HISTORY Patient presents for follow-up of chronic anticoagulation, iron  deficiency present for follow up .  + Chronic fatigue and shortness of breath. + Intermittent chronic chest discomfort.  Review of Systems  Constitutional:  Positive for fatigue. Negative for appetite change, chills, fever and unexpected weight change.  HENT:   Negative for hearing loss and voice change.   Eyes:  Negative for eye problems and icterus.  Respiratory:  Positive for shortness of breath. Negative for chest tightness and cough.   Cardiovascular:  Negative for chest pain and leg swelling.  Gastrointestinal:  Negative for abdominal distention and abdominal pain.  Endocrine: Negative for hot flashes.  Genitourinary:  Negative for difficulty urinating, dysuria and frequency.   Musculoskeletal:  Negative for arthralgias.  Skin:  Negative for itching and rash.  Neurological:  Negative for light-headedness and numbness.  Hematological:  Negative for adenopathy. Does not bruise/bleed easily.  Psychiatric/Behavioral:  Negative for confusion.     MEDICAL HISTORY: Past Medical History:  Diagnosis Date   Arthritis    right hip, since a fall   CAD (coronary artery disease)    a. 04/2012 Cath: 3VD->Med Rx;  b. 04/2013 PCI RCA (2 DES); c. 03/2014 PCI: LAD 80 (3.0x23 Xience Alpine DES); d. 06/2014 CABG x 2 ([facility]) LIMA->LAD, VG->OM; e. 12/2014 Cath ([facility]): patent grafts->Med Rx; f. 08/2015 Cath: patent grafts; g. 11/2016 Cath: LM min irregs, LAD 20p, 51m, LCX 100p/m, RCA 20ost, 10p/m/d, VG->OM1 nl, LIMA->dLAD nl; f. 07/2017 MV: No isch, EF 51%.   Cardiomyopathy, ischemic    a. 04/2012 Echo: EF 40-45%;  b. 08/2013 Echo: EF 45-50%; c. 01/2015 Echo: EF 40-45%; d. 07/2016 Echo: EF 60-65%; e. 09/2017 Echo: EF 55-60%, Gr1 DD.   Carotid arterial disease    a. 05/2013 Carotid U/S; bilat 40-50% ICA stenosis.   Chronic Chest Pain    USES NITRO   Chronic systolic CHF (congestive heart  failure) (HCC)    a. 01/2015 Echo: EF 40-45%; b. 07/2016 Echo: EF 60-65%; c. 09/2017 Echo: EF 55-60%, Gr1 DD, nl RV fxn.   Cough    Diverticulosis    Dizziness    Dyspnea    WITH EXERTION   Headache 1970's   migraine   History of blood transfusion 2016   post op   Hyperlipidemia    Hypertension    Iron  deficiency anemia due to chronic blood loss 09/11/2016   Myocardial infarction [facility])    unsure of when   Pulmonary emboli (HCC) 07/2016   a. On Xarelto .   Reflux esophagitis    Sternal pain    a. 03/2016 s/p Sternal wire removal; b. 05/2016 s/p redo median sternotomy for sternal debridement and sternal plating.   Syncope 04/2018   CARDIOLOGIST WAS DR PERLA AND HE WAS AWARE-NOW PT SEES PARASCHOS   Tubular adenoma of colon     SURGICAL HISTORY: Past Surgical History:  Procedure Laterality Date   CARDIAC CATHETERIZATION  05/01/2012   CARDIAC CATHETERIZATION  04/2013   [facility];x3 stent   CARDIAC CATHETERIZATION  01/11/15    [facility]   CARDIAC CATHETERIZATION N/A 09/16/2015   Procedure: LEFT HEART CATH AND CORS/GRAFTS ANGIOGRAPHY;  Surgeon: Evalene JINNY Lunger, MD;  Location: [facility] INVASIVE CV LAB;  Service: Cardiovascular;  Laterality: N/A;   CARPAL TUNNEL RELEASE     right hand   CATARACT EXTRACTION     LEFT   CATARACT EXTRACTION W/PHACO Left 09/16/2014   Procedure: CATARACT EXTRACTION PHACO AND INTRAOCULAR LENS PLACEMENT (IOC);  Surgeon: Dene Etienne, MD;  Location: [facility];  Service: Ophthalmology;  Laterality: Left;   COLONOSCOPY     COLONOSCOPY WITH PROPOFOL  N/A 01/28/2016   Procedure: COLONOSCOPY WITH PROPOFOL ;  Surgeon: Gladis RAYMOND Mariner, MD;  Location: [facility] ENDOSCOPY;  Service: Endoscopy;  Laterality: N/A;   COLONOSCOPY WITH PROPOFOL  N/A 08/23/2021   Procedure: COLONOSCOPY WITH PROPOFOL ;  Surgeon: Unk Corinn Skiff, MD;  Location: [facility] ENDOSCOPY;  Service: Gastroenterology;  Laterality: N/A;  REQUESTS 9AM OR LATER   CORONARY ANGIOPLASTY WITH STENT PLACEMENT  04/13/2014    CORONARY ARTERY BYPASS GRAFT  06-26-14   x3 bypasses   CORONARY PRESSURE/FFR WITH 3D MAPPING N/A 12/10/2018   Procedure: KEN PRESSURE WIRE/FFR STUDY;  Surgeon: Ammon Blunt, MD;  Location: [facility] INVASIVE CV LAB;  Service: Cardiovascular;  Laterality: N/A;   CORONARY STENT INTERVENTION N/A 09/26/2023   Procedure: CORONARY STENT INTERVENTION;  Surgeon: Katina Albright, MD;  Location: [facility] INVASIVE CV LAB;  Service: Cardiovascular;  Laterality: N/A;   CORONARY ULTRASOUND/IVUS N/A 12/10/2018   Procedure: Intravascular Ultrasound/IVUS;  Surgeon: Ammon Blunt, MD;  Location: [facility] INVASIVE CV LAB;  Service: Cardiovascular;  Laterality: N/A;   CYSTOSCOPY N/A 08/14/2018   Procedure: CYSTOSCOPY;  Surgeon: Twylla Glendia BROCKS, MD;  Location: [facility] ORS;  Service: Urology;  Laterality: N/A;   DE QUERVAIN'S RELEASE Left 08/22/2012   ESOPHAGOGASTRODUODENOSCOPY (EGD) WITH PROPOFOL  N/A 04/26/2015   Procedure: ESOPHAGOGASTRODUODENOSCOPY (EGD) WITH PROPOFOL ;  Surgeon: Deward CINDERELLA Piedmont, MD;  Location: [facility] ENDOSCOPY;  Service: Gastroenterology;  Laterality: N/A;   ESOPHAGOGASTRODUODENOSCOPY (EGD) WITH PROPOFOL  N/A 05/29/2017   Procedure: ESOPHAGOGASTRODUODENOSCOPY (EGD) WITH PROPOFOL ;  Surgeon: Mariner Gladis RAYMOND, MD;  Location: [facility] ENDOSCOPY;  Service: Endoscopy;  Laterality: N/A;   LEFT HEART CATH AND CORONARY ANGIOGRAPHY N/A 12/04/2016   Procedure: LEFT HEART CATH AND CORS/GRAFTS  ANGIOGRAPHY;  Surgeon: Darron Deatrice LABOR, MD;  Location: [facility] CV LAB;  Service: Cardiovascular;  Laterality: N/A;   LEFT HEART CATH AND CORONARY ANGIOGRAPHY Right 09/25/2023   Procedure: LEFT HEART CATH AND CORONARY ANGIOGRAPHY with possible coronary intervention;  Surgeon: Ammon Blunt, MD;  Location: [facility] INVAS[facility] CV LAB;  Service: Cardiovascular;  Laterality: Right;   LEFT HEART CATH AND CORS/GRAFTS ANGIOGRAPHY N/A 04/09/2018   Procedure: LEFT HEART CATH AND CORS/GRAFTS ANGIOGRAPHY;  Surgeon: Ammon Blunt, MD;   Location: [facility];  Service: Cardiovascular;  Laterality: N/A;   LEFT HEART CATH AND CORS/GRAFTS ANGIOGRAPHY N/A 12/10/2018   Procedure: LEFT HEART CATH AND CORS/GRAFTS ANGIOGRAPHY;  Surgeon: Ammon Blunt, MD;  Location: [facility] CV LAB;  Service: Cardiovascular;  Laterality: N/A;   LEFT HEART CATH AND CORS/GRAFTS ANGIOGRAPHY Left 05/25/2020   Procedure: LEFT HEART CATH AND CORS/GRAFTS ANGIOGRAPHY;  Surgeon: Ammon Blunt, MD;  Location: [facility] CV LAB;  Service: Cardiovascular;  Laterality: Left;   RIB PLATING N/A 05/25/2016   Procedure: STERNAL PLATING;  Surgeon: Elspeth BROCKS Millers, MD;  Location: [facility] OR;  Service: Thoracic;  Laterality: N/A;   right shoulder     STERNAL WIRES REMOVAL N/A 03/30/2016   Procedure: STERNAL WIRES REMOVAL;  Surgeon: Elspeth BROCKS Millers, MD;  Location: [facility] OR;  Service: Thoracic;  Laterality: N/A;   TONSILLECTOMY  SOCIAL HISTORY: Social History   Socioeconomic History   Marital status: Married    Spouse name: Not on file   Number of children: Not on file   Years of education: Not on file   Highest education level: Not on file  Occupational History   Not on file  Tobacco Use   Smoking status: Former    Current packs/day: 0.00    Average packs/day: 1 pack/day for 35.0 years (35.0 ttl pk-yrs)    Types: Cigarettes    Start date: 42    Quit date: 2009    Years since quitting: 16.8   Smokeless tobacco: Never  Vaping Use   Vaping status: Never Used  Substance and Sexual Activity   Alcohol use: No   Drug use: No   Sexual activity: Yes  Other Topics Concern   Not on file  Social History Narrative   Not on file   Social Drivers of Health   Financial Resource Strain: Low Risk  (08/14/2023)   Received from [facility]   Overall Financial Resource Strain (CARDIA)    Difficulty of Paying Living Expenses: Not hard at all  Food Insecurity: No Food Insecurity (09/24/2023)   Hunger Vital Sign     Worried About Running Out of Food in the Last Year: Never true    Ran Out of Food in the Last Year: Never true  Transportation Needs: No Transportation Needs (09/24/2023)   PRAPARE - Administrator, Civil Service (Medical): No    Lack of Transportation (Non-Medical): No  Physical Activity: Not on file  Stress: Not on file  Social Connections: Moderately Integrated (09/24/2023)   Social Connection and Isolation Panel    Frequency of Communication with Friends and Family: Three times a week    Frequency of Social Gatherings with Friends and Family: More than three times a week    Attends Religious Services: More than 4 times per year    Active Member of [employer] or Organizations: No    Attends Banker Meetings: Patient declined    Marital Status: Married  Catering manager Violence: Not At Risk (09/24/2023)   Humiliation, Afraid, Rape, and Kick questionnaire    Fear of Current or Ex-Partner: No    Emotionally Abused: No    Physically Abused: No    Sexually Abused: No    FAMILY HISTORY Family History  Problem Relation Age of Onset   Heart attack Father 48       MI   Cancer Maternal Uncle     ALLERGIES:  is allergic to contrast media [iodinated contrast media], hydrocodone, metrizamide, oxycodone hcl, and vicodin [hydrocodone-acetaminophen ].  MEDICATIONS:  Current Outpatient Medications  Medication Sig Dispense Refill   atorvastatin  (LIPITOR ) 80 MG tablet Take 1 tablet (80 mg total) by mouth daily. 30 tablet 0   ELIQUIS  2.5 MG TABS tablet Take 1 tablet (2.5 mg total) by mouth 2 (two) times daily. 60 tablet 0   ezetimibe  (ZETIA ) 10 MG tablet Take 1 tablet (10 mg total) by mouth daily. 30 tablet 0   isosorbide  mononitrate (IMDUR ) 60 MG 24 hr tablet Take 1 tablet (60 mg total) by mouth 2 (two) times daily. 60 tablet 0   nitroGLYCERIN  (NITROSTAT ) 0.4 MG SL tablet Place 1 tablet (0.4 mg total) under the tongue every 5 (five) minutes as needed for chest pain. 30 tablet 0    ranolazine  (RANEXA ) 1000 MG SR tablet Take 1 tablet (1,000 mg total) by mouth 2 (two) times  daily. 60 tablet 0   aspirin  EC 81 MG tablet Take 1 tablet (81 mg total) by mouth daily. Swallow whole. 30 tablet 0   clindamycin (CLEOCIN T) 1 % external solution Apply topically.     cyanocobalamin  1000 MCG tablet Take 1 tablet (1,000 mcg total) by mouth daily. 30 tablet 0   losartan  (COZAAR ) 25 MG tablet Take 1 tablet (25 mg total) by mouth daily. 30 tablet 0   pantoprazole  (PROTONIX ) 40 MG tablet Take 1 tablet (40 mg total) by mouth daily. 30 tablet 0   No current facility-administered medications for this visit.    PHYSICAL EXAMINATION:  ECOG PERFORMANCE STATUS: 1 - Symptomatic but completely ambulatory   Vitals:   11/15/23 1322  BP: (!) 99/53  Pulse: 61  Resp: 19  Temp: 97.9 F (36.6 C)  SpO2: 100%    Filed Weights   11/15/23 1322  Weight: 179 lb 4.8 oz (81.3 kg)     Physical Exam Constitutional:      General: He is not in acute distress.    Appearance: He is not diaphoretic.  HENT:     Head: Normocephalic and atraumatic.  Eyes:     General: No scleral icterus. Cardiovascular:     Rate and Rhythm: Normal rate.  Pulmonary:     Effort: Pulmonary effort is normal. No respiratory distress.     Breath sounds: Normal breath sounds.  Abdominal:     General: Bowel sounds are normal. There is no distension.     Palpations: Abdomen is soft.  Musculoskeletal:        General: Normal range of motion.     Cervical back: Normal range of motion and neck supple.  Skin:    General: Skin is warm and dry.     Findings: No erythema.  Neurological:     Mental Status: He is alert and oriented to person, place, and time. Mental status is at baseline.     Cranial Nerves: No cranial nerve deficit.     Motor: No abnormal muscle tone.  Psychiatric:        Mood and Affect: Affect normal.         LABORATORY DATA: I have personally reviewed the data as listed:  Appointment on  11/15/2023  Component Date Value Ref Range Status   Ferritin 11/15/2023 19 (L)  24 - 336 ng/mL Final   Performed at [facility], [address] [place], [place] [address]   Iron  11/15/2023 84  45 - 182 ug/dL Final   TIBC 89/76/7974 361  250 - 450 ug/dL Final   Saturation Ratios 11/15/2023 23  17.9 - 39.5 % Final   UIBC 11/15/2023 277  ug/dL Final   Performed at [facility], [address] [place], [place] [address]   WBC Count 11/15/2023 6.5  4.0 - 10.5 K/uL Final   RBC 11/15/2023 4.47  4.22 - 5.81 MIL/uL Final   Hemoglobin 11/15/2023 14.6  13.0 - 17.0 g/dL Final   HCT 89/76/7974 43.5  39.0 - 52.0 % Final   MCV 11/15/2023 97.3  80.0 - 100.0 fL Final   MCH 11/15/2023 32.7  26.0 - 34.0 pg Final   MCHC 11/15/2023 33.6  30.0 - 36.0 g/dL Final   RDW 89/76/7974 13.3  11.5 - 15.5 % Final   Platelet Count 11/15/2023 237  150 - 400 K/uL Final   nRBC 11/15/2023 0.0  0.0 - 0.2 % Final   Neutrophils Relative % 11/15/2023 59  % Final  Neutro Abs 11/15/2023 3.8  1.7 - 7.7 K/uL Final   Lymphocytes Relative 11/15/2023 30  % Final   Lymphs Abs 11/15/2023 2.0  0.7 - 4.0 K/uL Final   Monocytes Relative 11/15/2023 9  % Final   Monocytes Absolute 11/15/2023 0.6  0.1 - 1.0 K/uL Final   Eosinophils Relative 11/15/2023 1  % Final   Eosinophils Absolute 11/15/2023 0.1  0.0 - 0.5 K/uL Final   Basophils Relative 11/15/2023 1  % Final   Basophils Absolute 11/15/2023 0.0  0.0 - 0.1 K/uL Final   Immature Granulocytes 11/15/2023 0  % Final   Abs Immature Granulocytes 11/15/2023 0.02  0.00 - 0.07 K/uL Final   Performed at [facility], [address], [place], [place] [address]   Lab Results  Component Value Date   IRON  84 11/15/2023   TIBC 361 11/15/2023   IRONPCTSAT 23 11/15/2023   Lab Results  Component Value Date   FERRITIN 19 (L) 11/15/2023    RADIOGRAPHIC STUDIES: I have personally reviewed the radiological images as listed and agree with the findings in the  report CARDIAC CATHETERIZATION Result Date: 09/26/2023   Ost LM to Mid LM lesion is 40% stenosed.   Prox LAD lesion is 50% stenosed.   Ost Cx to Prox Cx lesion is 80% stenosed.   2nd Mrg lesion is 100% stenosed.   Non-stenotic Prox LAD to Mid LAD lesion was previously treated.   Non-stenotic Ost RCA to Mid RCA lesion was previously treated.   Non-stenotic Mid RCA lesion was previously treated. 1.  Negative pressure wire interrogation of the left main and proximal LAD 2.  Optimize medical therapy   CARDIAC CATHETERIZATION Result Date: 09/25/2023   Ost Cx to Prox Cx lesion is 80% stenosed.   2nd Mrg lesion is 100% stenosed.   Prox LAD lesion is 50% stenosed.   Ost LM to Mid LM lesion is 40% stenosed.   Non-stenotic Prox LAD to Mid LAD lesion was previously treated.   Non-stenotic Ost RCA to Mid RCA lesion was previously treated.   Non-stenotic Mid RCA lesion was previously treated.   There is mild left ventricular systolic dysfunction.   LV end diastolic pressure is normal.   The left ventricular ejection fraction is 35-45% by visual estimate. 1.  Three-vessel coronary artery disease with patent stent proximal/mid LAD, diffuse 80% stenosis ostial/proximal very small caliber left circumflex, patent stent proximal and mid RCA, with atretic but patent LIMA to distal LAD, and patent SVG to ramus intermedius branch, overall appears unchanged compared to prior cardiac catheterization 05/25/2020 2.  Mildly reduced left ventricular function with anterior apical hypokinesis, estimate LVEF 40% Recommendations 1.  Continue medical therapy 2.  Maximize antianginals 3.  Aggressive risk factor modification 4.  May discharge home later today   CT Angio Chest Pulmonary Embolism (PE) W or WO Contrast Result Date: 09/24/2023 CLINICAL DATA:  Pulmonary embolism (PE) suspected, high prob Chest pain. EXAM: CT ANGIOGRAPHY CHEST WITH CONTRAST TECHNIQUE: Multidetector CT imaging of the chest was performed using the standard protocol during  bolus administration of intravenous contrast. Multiplanar CT image reconstructions and MIPs were obtained to evaluate the vascular anatomy. Patient received 4 hour premedication for contrast allergy. RADIATION DOSE REDUCTION: This exam was performed according to the departmental dose-optimization program which includes automated exposure control, adjustment of the mA and/or kV according to patient size and/or use of iterative reconstruction technique. CONTRAST:  75mL OMNIPAQUE  IOHEXOL  350 MG/ML SOLN COMPARISON:  Radiograph earlier today. Chest CT  05/17/2023, high-resolution chest CT 08/01/2019 FINDINGS: Cardiovascular: There are no filling defects within the pulmonary arteries to suggest pulmonary embolus. Prior CABG. Dense calcification of native coronary arteries. The heart is mildly enlarged. Aortic atherosclerosis without acute aortic findings. No pericardial effusion. Mediastinum/Nodes: Stable shotty mediastinal lymph nodes, some of which are calcified. There are prominent bilateral hilar nodes. The esophagus is decompressed. Lungs/Pleura: Emphysema. Heterogeneous pulmonary parenchyma with areas of interspersed ground-glass. Overall low lung volumes with motion artifact. Assessment of bronchiectasis seen on prior is limited due to motion. No pleural effusion. Bowing of the central bronchi may represent bronchomalacia. No pulmonary mass. Upper Abdomen: No acute findings.  Gallstones. Musculoskeletal: Prior median sternotomy. There are no acute or suspicious osseous abnormalities. Review of the MIP images confirms the above findings. IMPRESSION: 1. No pulmonary embolus. 2. Heterogeneous pulmonary parenchyma with areas of interspersed ground-glass, likely hypoventilatory change superimposed on chronic interstitial lung disease. 3. Bowing of the central bronchi may represent bronchomalacia. 4. Cholelithiasis. Aortic Atherosclerosis (ICD10-I70.0) and Emphysema (ICD10-J43.9). Electronically Signed   By: Andrea Gasman M.D.   On: 09/24/2023 20:23   DG Chest 1 View Result Date: 09/24/2023 CLINICAL DATA:  Chest pain EXAM: CHEST  1 VIEW COMPARISON:  04/03/2023 FINDINGS: Low volume film. Cardiopericardial silhouette is at upper limits of normal for size. There is pulmonary vascular congestion without overt pulmonary edema. No pneumothorax or pleural effusion. Telemetry leads overlie the chest. IMPRESSION: Low volume film with pulmonary vascular congestion. Electronically Signed   By: Camellia Candle M.D.   On: 09/24/2023 09:44

## 2023-11-15 NOTE — Assessment & Plan Note (Addendum)
 continue Eliquis  2.5mg  BID.   Eliquis  prescription is not managed by me.

## 2023-11-15 NOTE — Assessment & Plan Note (Signed)
 Multifactorial. Fatigue and shortness of breath level is out of proportion to his normal hemoglobin and mild iron deficiency. Likely secondary to his CAD and emphysema. Recommend patient to follow-up with cardiology and pulmonologist.

## 2023-12-04 ENCOUNTER — Ambulatory Visit: Payer: Self-pay | Admitting: Oncology

## 2023-12-10 ENCOUNTER — Inpatient Hospital Stay: Attending: Oncology

## 2023-12-10 VITALS — BP 132/72 | HR 57 | Temp 97.8°F

## 2023-12-10 DIAGNOSIS — D509 Iron deficiency anemia, unspecified: Secondary | ICD-10-CM | POA: Insufficient documentation

## 2023-12-10 DIAGNOSIS — E611 Iron deficiency: Secondary | ICD-10-CM

## 2023-12-10 MED ORDER — IRON SUCROSE 20 MG/ML IV SOLN
200.0000 mg | Freq: Once | INTRAVENOUS | Status: AC
  Administered 2023-12-10: 200 mg via INTRAVENOUS
  Filled 2023-12-10: qty 10

## 2023-12-10 NOTE — Progress Notes (Signed)
 Pt reports that he has had soreness across upper chest since cracking pecans last week, Pt believes the pain is from the repetitive motion of cracking the pecans and would like to proceed with Venofer , and does not see the need in going to the ER. Dr. Babara made aware, no further orders given at this time. Pt educated to contact cardiologist if symptoms persists, worsens, or if new symptoms develop to go to the ER. Pt verbalizes understanding and reports he already has an upcoming appt with PCP next week. No s/s of distress noted and pt stable at this time.

## 2023-12-27 ENCOUNTER — Telehealth: Payer: Self-pay

## 2023-12-27 NOTE — Telephone Encounter (Signed)
 Incoming fax received from pharmacy on patient for Ranolazine , however patient is no longer under the care of Jessica K. Caro, NP. I called pharmacy and left a detailed voicemail informing them that patient is no longer under our care.

## 2023-12-31 ENCOUNTER — Other Ambulatory Visit (HOSPITAL_COMMUNITY): Payer: Self-pay
